# Patient Record
Sex: Female | Born: 1970
Health system: Southern US, Community
[De-identification: ages and names within clinical notes are randomized; demographics above are authoritative.]

## PROBLEM LIST (undated history)

## (undated) DIAGNOSIS — F329 Major depressive disorder, single episode, unspecified: Secondary | ICD-10-CM

## (undated) DIAGNOSIS — E349 Endocrine disorder, unspecified: Secondary | ICD-10-CM

## (undated) DIAGNOSIS — I10 Essential (primary) hypertension: Secondary | ICD-10-CM

## (undated) DIAGNOSIS — D219 Benign neoplasm of connective and other soft tissue, unspecified: Secondary | ICD-10-CM

## (undated) DIAGNOSIS — C50919 Malignant neoplasm of unspecified site of unspecified female breast: Secondary | ICD-10-CM

## (undated) DIAGNOSIS — D649 Anemia, unspecified: Secondary | ICD-10-CM

## (undated) DIAGNOSIS — N979 Female infertility, unspecified: Secondary | ICD-10-CM

## (undated) DIAGNOSIS — N939 Abnormal uterine and vaginal bleeding, unspecified: Secondary | ICD-10-CM

## (undated) DIAGNOSIS — F32A Depression, unspecified: Secondary | ICD-10-CM

## (undated) HISTORY — DX: Major depressive disorder, single episode, unspecified: F32.9

## (undated) HISTORY — DX: Abnormal uterine and vaginal bleeding, unspecified: N93.9

## (undated) HISTORY — PX: MASTECTOMY: SHX3

## (undated) HISTORY — DX: Depression, unspecified: F32.A

## (undated) HISTORY — DX: Malignant neoplasm of unspecified site of unspecified female breast: C50.919

## (undated) HISTORY — PX: BREAST SURGERY: SHX581

## (undated) HISTORY — DX: Endocrine disorder, unspecified: E34.9

## (undated) HISTORY — PX: ABDOMINAL HYSTERECTOMY: SHX81

## (undated) HISTORY — DX: Female infertility, unspecified: N97.9

## (undated) HISTORY — DX: Benign neoplasm of connective and other soft tissue, unspecified: D21.9

## (undated) HISTORY — PX: OTHER SURGICAL HISTORY: SHX169

## (undated) HISTORY — DX: Essential (primary) hypertension: I10

## (undated) HISTORY — DX: Anemia, unspecified: D64.9

---

## 2014-10-25 ENCOUNTER — Telehealth: Payer: Self-pay | Admitting: *Deleted

## 2014-10-25 NOTE — Telephone Encounter (Signed)
Pt called for an appt w/ Dr. Lindi Adie.  Informed her that I would need to obtain records 1st.  Received records, called pt and confirmed 10/26/14 med onc appt w/ her. Unable to mail before appt letter - gave verbal.  Unable to mail welcoming packet - gave directions and instructions.  Unable to mail intake form - placed a note for one to be given at time of check in.  Took paperwork to HIM to create chart.

## 2014-10-26 ENCOUNTER — Ambulatory Visit: Payer: Self-pay | Admitting: Hematology and Oncology

## 2014-10-26 ENCOUNTER — Ambulatory Visit: Payer: Self-pay

## 2014-10-26 ENCOUNTER — Telehealth: Payer: Self-pay | Admitting: *Deleted

## 2014-10-26 NOTE — Telephone Encounter (Signed)
Pt called - mom is in the hospital.  Cancel appt for today and will reschedule at a later time.

## 2014-11-02 ENCOUNTER — Ambulatory Visit: Payer: Self-pay

## 2014-11-02 ENCOUNTER — Encounter: Payer: Self-pay | Admitting: Hematology and Oncology

## 2014-11-02 ENCOUNTER — Encounter (INDEPENDENT_AMBULATORY_CARE_PROVIDER_SITE_OTHER): Payer: Self-pay

## 2014-11-02 ENCOUNTER — Ambulatory Visit (HOSPITAL_BASED_OUTPATIENT_CLINIC_OR_DEPARTMENT_OTHER): Payer: 59 | Admitting: Hematology and Oncology

## 2014-11-02 VITALS — BP 167/109 | HR 95 | Temp 98.6°F | Resp 18 | Wt 146.8 lb

## 2014-11-02 DIAGNOSIS — C50411 Malignant neoplasm of upper-outer quadrant of right female breast: Secondary | ICD-10-CM | POA: Insufficient documentation

## 2014-11-02 DIAGNOSIS — C44501 Unspecified malignant neoplasm of skin of breast: Secondary | ICD-10-CM

## 2014-11-02 DIAGNOSIS — C50911 Malignant neoplasm of unspecified site of right female breast: Secondary | ICD-10-CM

## 2014-11-02 NOTE — Progress Notes (Signed)
Checked in new patient with no issues. She has not traveled and [employer]   [place], [place].

## 2014-11-03 NOTE — Progress Notes (Signed)
[place] CONSULT NOTE  No care team member to display  CHIEF COMPLAINTS/PURPOSE OF CONSULTATION:  Recurrent breast cancer  HISTORY OF PRESENTING ILLNESS:  Misty Arnold 43 y.o. female is here because of recent diagnosis of right breast recurrent breast cancer. 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 She developed recurrence in the chest wall in 2013 at that was excised with positive margins. She did not wish to undergo reexcision are additional treatments as recommended by her oncologist. He decided to go to [place] to receive alternative treatments. Apparently she received this for uncertain length of time. In February 2015 she returned back to see Dr. Denyse Amass who performed breast MRI and ultrasound that showed multiple lesions involving the entire right breast including the medial aspect of the chest wall. She recommended chemotherapy as well as surgical options. It appears that she decided to go back to [place] to receive further treatments. 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 But she plans to go back to see them soon and continue with the same approach in spite of the fact that the disease being continuing to grow in the right breast/chest wall area. Patient seems to think that if only she could take those herbal supplements and medications she can fight this cancer. She reports that stress aggravates her cancer and she can see the difference on her breast.  I reviewed her records extensively and collaborated the history with the patient.  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 But she could not afford these trips.   01/08/2014 Breast MRI right breast multiple enhancing masses within the soft tissues largest 5.6 cm in wall skin surface and the capsule of the silicone prosthesis, contiguous nodules involving in inferomedial breast and tired and subcentimeter nodules across the midline   MEDICAL HISTORY:  Past Medical History  Diagnosis Date  . Breast cancer   . Hypertension     SURGICAL HISTORY: Past Surgical History  Procedure Laterality Date  . Abdominal hysterectomy    . Mastectomy    . Salivary gland stone      SOCIAL HISTORY: History   Social History  . Marital Status: Divorced    Spouse Name: N/A    Number of Children: N/A  . Years of Education: N/A   Occupational History  . Not on file.   Social History Main Topics  . Smoking status: Never Smoker   . Smokeless tobacco: Never Used  . Alcohol Use: No  . Drug Use: No  . Sexual Activity: Yes   Other Topics Concern  . Not on file   Social History Narrative  .  No narrative on file    FAMILY HISTORY: Family History  Problem Relation Age of Onset  . Cancer Maternal Aunt   . Cancer Maternal Grandfather   . Cancer Paternal Grandfather     ALLERGIES:  has no allergies on file.  MEDICATIONS:  Current Outpatient Prescriptions  Medication Sig Dispense Refill  . amLODipine  (NORVASC) 10 MG tablet Take by mouth.    Marland Kitchen Bioflavonoid Products (ESTER C PO) Take by mouth.    Marland Kitchen BLACK CURRANT SEED OIL PO Take by mouth.    . Cholecalciferol (VITAMIN D PO) Take by mouth.    . Fish Oil OIL by Does not apply route.    . Flaxseed Oil OIL by Does not apply route.    . Misc Natural Products (DANDELION ROOT PO) Take by mouth.    . [place], Absinthium, Oil OIL by Does not apply route.     No current facility-administered medications for this visit.    REVIEW OF SYSTEMS:   Constitutional: Denies fevers, chills or abnormal night sweats Eyes: Denies blurriness of vision, double vision or watery eyes Ears, nose, mouth, throat, and face: Denies mucositis or sore throat Respiratory: Denies cough, dyspnea or wheezes Cardiovascular: Denies palpitation, chest discomfort or lower extremity swelling Gastrointestinal:  Denies nausea, heartburn or change in bowel habits Skin: Denies abnormal skin rashes Lymphatics: Denies new lymphadenopathy or easy bruising Neurological:Denies numbness, tingling or new weaknesses Behavioral/Psych: Mood is stable, no new changes  Breast: extremely large palpable masses in the right breast All other systems were reviewed with the patient and are negative.  PHYSICAL EXAMINATION: ECOG PERFORMANCE STATUS: 1 - Symptomatic but completely ambulatory  Filed Vitals:   11/02/14 1621  BP: 167/109  Pulse: 95  Temp: 98.6 F (37 C)  Resp: 18   Filed Weights   11/02/14 1621  Weight: 146 lb 12.8 oz (66.588 kg)    GENERAL:alert, no distress and comfortable SKIN: skin color, texture, turgor are normal, no rashes or significant lesions EYES: normal, conjunctiva are pink and non-injected, sclera clear OROPHARYNX:no exudate, no erythema and lips, buccal mucosa, and tongue normal  NECK: supple, thyroid normal size, non-tender, without nodularity LYMPH:  no palpable lymphadenopathy in the cervical, axillary or inguinal LUNGS: clear to auscultation and  percussion with normal breathing effort HEART: regular rate & rhythm and no murmurs and no lower extremity edema ABDOMEN:abdomen soft, non-tender and normal bowel sounds Musculoskeletal:no cyanosis of digits and no clubbing  PSYCH: alert & oriented x 3 with fluent speech NEURO: no focal motor/sensory deficits BREAST:massively enlarged right breast with multiple nodules that are inherent to the capsule of her implant even along the anterior aspect of the axillary fold. No palpable axillary or supraclavicular lymphadenopathy  RADIOGRAPHIC STUDIES: I have personally reviewed the radiological reports and agreed with the findings in the report. I have summarize the results of her scans as above  ASSESSMENT AND PLAN:  Breast cancer, right breast Recurrent right breast cancer initially DCIS treated with mastectomy followed by reconstruction and later developed chest wall recurrence in 2013 ER/PR positive HER-2 negative, patient underwent alternative therapies in [place] but could not afford the trips and hence she has not been on any breast cancer therapy for a long time.  Staging discussion: I discussed with her that based on the tumor characteristic I would stage her as T4b N0 M0 stage IIIB. However given the extent of skin and soft tissue involvement, I would like up in a PET CT scan to complete staging.  Preliminary  discussion regarding treatment options were explored. Patient is not interested in any conventional treatments and is still planning on going down to [place] to receive her herbal treatments. She is convinced that those treatments offer more to her.  Our Recommendation: neoadjuvant chemotherapy followed by surgery followed by radiation and antiestrogen therapy  But patient would most likely not follow recommendations. She primarily wants [name] to assist her with staging purposes so that she can make her decisions and plans accordingly. return to clinic after PET/CT Scan.   Rulon Eisenmenger, MD 11/03/2014 7:47 AM

## 2014-11-03 NOTE — Assessment & Plan Note (Signed)
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  Staging discussion: I discussed with her that based on the tumor characteristic I would stage her as T4b N0 M0 stage IIIB. However given the extent of skin and soft tissue involvement, I would like up in a PET CT scan to complete staging.  Preliminary discussion regarding treatment options were explored. Patient is not interested in any conventional treatments and is still planning on going down to [place] to receive her herbal treatments. She is convinced that those treatments offer more to her.  Our Recommendation: neoadjuvant chemotherapy followed by surgery followed by radiation and antiestrogen therapy  But patient would most likely not follow recommendations. She primarily wants [name] to assist her with staging purposes so that she can make her decisions and plans accordingly.

## 2014-11-04 ENCOUNTER — Other Ambulatory Visit: Payer: Self-pay

## 2014-11-08 ENCOUNTER — Other Ambulatory Visit: Payer: Self-pay | Admitting: *Deleted

## 2014-11-08 ENCOUNTER — Telehealth: Payer: Self-pay | Admitting: Hematology and Oncology

## 2014-11-08 NOTE — Telephone Encounter (Signed)
, °

## 2014-11-12 ENCOUNTER — Ambulatory Visit (HOSPITAL_COMMUNITY)
Admission: RE | Admit: 2014-11-12 | Discharge: 2014-11-12 | Disposition: A | Discharge status: Home / Self Care | Payer: 59 | Source: Ambulatory Visit | Attending: Hematology and Oncology | Admitting: Hematology and Oncology

## 2014-11-12 ENCOUNTER — Encounter (HOSPITAL_COMMUNITY): Payer: Self-pay

## 2014-11-12 DIAGNOSIS — C50911 Malignant neoplasm of unspecified site of right female breast: Secondary | ICD-10-CM | POA: Insufficient documentation

## 2014-11-12 DIAGNOSIS — Z9011 Acquired absence of right breast and nipple: Secondary | ICD-10-CM | POA: Diagnosis not present

## 2014-11-12 DIAGNOSIS — J323 Chronic sphenoidal sinusitis: Secondary | ICD-10-CM | POA: Diagnosis not present

## 2014-11-12 LAB — GLUCOSE, CAPILLARY: Glucose-Capillary: 89 mg/dL (ref 70–99)

## 2014-11-12 MED ORDER — FLUDEOXYGLUCOSE F - 18 (FDG) INJECTION
7.2800 | Freq: Once | INTRAVENOUS | Status: AC | PRN
  Administered 2014-11-12: 7.28 via INTRAVENOUS

## 2014-11-15 ENCOUNTER — Ambulatory Visit (HOSPITAL_BASED_OUTPATIENT_CLINIC_OR_DEPARTMENT_OTHER): Payer: 59 | Admitting: Hematology and Oncology

## 2014-11-15 ENCOUNTER — Ambulatory Visit (HOSPITAL_BASED_OUTPATIENT_CLINIC_OR_DEPARTMENT_OTHER): Payer: 59

## 2014-11-15 ENCOUNTER — Telehealth: Payer: Self-pay | Admitting: Hematology and Oncology

## 2014-11-15 DIAGNOSIS — Z170 Estrogen receptor positive status [ER+]: Secondary | ICD-10-CM

## 2014-11-15 DIAGNOSIS — C50911 Malignant neoplasm of unspecified site of right female breast: Secondary | ICD-10-CM

## 2014-11-15 DIAGNOSIS — C7989 Secondary malignant neoplasm of other specified sites: Secondary | ICD-10-CM

## 2014-11-15 LAB — CBC WITH DIFFERENTIAL/PLATELET
BASO%: 0.5 % (ref 0.0–2.0)
Basophils Absolute: 0 10*3/uL (ref 0.0–0.1)
EOS%: 0.3 % (ref 0.0–7.0)
Eosinophils Absolute: 0 10*3/uL (ref 0.0–0.5)
HCT: 43.5 % (ref 34.8–46.6)
HGB: 14.3 g/dL (ref 11.6–15.9)
LYMPH%: 36.9 % (ref 14.0–49.7)
MCH: 29.9 pg (ref 25.1–34.0)
MCHC: 32.9 g/dL (ref 31.5–36.0)
MCV: 91 fL (ref 79.5–101.0)
MONO#: 0.4 10*3/uL (ref 0.1–0.9)
MONO%: 5.2 % (ref 0.0–14.0)
NEUT#: 4.2 10*3/uL (ref 1.5–6.5)
NEUT%: 57.1 % (ref 38.4–76.8)
Platelets: 260 10*3/uL (ref 145–400)
RBC: 4.78 10*6/uL (ref 3.70–5.45)
RDW: 12.7 % (ref 11.2–14.5)
WBC: 7.3 10*3/uL (ref 3.9–10.3)
lymph#: 2.7 10*3/uL (ref 0.9–3.3)

## 2014-11-15 LAB — COMPREHENSIVE METABOLIC PANEL (CC13)
ALT: 12 U/L (ref 0–55)
AST: 17 U/L (ref 5–34)
Albumin: 4.1 g/dL (ref 3.5–5.0)
Alkaline Phosphatase: 72 U/L (ref 40–150)
Anion Gap: 6 mEq/L (ref 3–11)
BUN: 9.8 mg/dL (ref 7.0–26.0)
CO2: 28 mEq/L (ref 22–29)
Calcium: 9.2 mg/dL (ref 8.4–10.4)
Chloride: 105 mEq/L (ref 98–109)
Creatinine: 0.8 mg/dL (ref 0.6–1.1)
EGFR: >90 mL/min/{1.73_m2} (ref >90)
Glucose: 92 mg/dl (ref 70–140)
Potassium: 3.7 mEq/L (ref 3.5–5.1)
Sodium: 139 mEq/L (ref 136–145)
Total Bilirubin: 0.44 mg/dL (ref 0.20–1.20)
Total Protein: 7.3 g/dL (ref 6.4–8.3)

## 2014-11-15 NOTE — Assessment & Plan Note (Signed)
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  PET/CT scan 11/12/2014 did not show metastatic disease but showed extensive involvement in the right breast in an around the implant Stage 3B  Our Recommendation: neoadjuvant chemotherapy followed by surgery followed by radiation and antiestrogen therapy Patient's decision: 1. Return to [place] for alternative treatments 2. Come back and follow as once every 3 months for blood work and for the assessment of her breast cancer. 3. I instructed her that if she feels that the alternative treatments are not working, she needs to call us ASAP so that we can initiate definitive treatment.  Blood work to be done today and in 3 months with follow-up.

## 2014-11-15 NOTE — Progress Notes (Signed)
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; patient went to cancer treatment centers of [place] for second opinion and delayed therapy   01/26/2011 Surgery right mastectomy followed by reconstruction: 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 But she could not afford these trips.   01/08/2014 Breast MRI right breast multiple enhancing masses within the soft tissues largest 5.6 cm in wall skin surface and the capsule of the silicone prosthesis, contiguous nodules involving in inferomedial breast and tired and subcentimeter nodules across the midline    CHIEF COMPLIANT: right breast cancer follow-up after PET/CT  INTERVAL HISTORY: Misty Arnold is a 44 year old lady with above-mentioned history of right-sided breast cancer who came for a new patient visit for recurrent disease a few weeks ago. She had a PET CT scan is here today to discuss the result. She reports that the breast cancer that is affecting the skin and the subcutaneous tissues in the right breast is not hurting her or bleeding.  REVIEW OF SYSTEMS:   Constitutional: Denies fevers, chills or  abnormal weight loss Eyes: Denies blurriness of vision Ears, nose, mouth, throat, and face: Denies mucositis or sore throat Respiratory: Denies cough, dyspnea or wheezes Cardiovascular: Denies palpitation, chest discomfort or lower extremity swelling Gastrointestinal:  Denies nausea, heartburn or change in bowel habits Skin: Denies abnormal skin rashes Lymphatics: Denies new lymphadenopathy or easy bruising Neurological:Denies numbness, tingling or new weaknesses Behavioral/Psych: Mood is stable, no new changes  Breast: moderate nodularity and skin changes in the right breast All other systems were reviewed with the patient and are negative.  I have reviewed the past medical history, past surgical history, social history and family history with the patient and they are unchanged from previous note.  ALLERGIES:  has No Known Allergies.  MEDICATIONS:  Current Outpatient Prescriptions  Medication Sig Dispense Refill  . amLODipine (NORVASC) 10 MG tablet Take by mouth.    Marland Kitchen Bioflavonoid Products (ESTER C PO) Take by mouth.    Marland Kitchen BLACK CURRANT SEED OIL PO Take by mouth.    . Cholecalciferol (VITAMIN D PO) Take by mouth.    . escitalopram (LEXAPRO) 10 MG tablet     . Fish Oil OIL by Does not apply route.    . Flaxseed Oil OIL by Does not apply route.    . Misc Natural Products (DANDELION ROOT PO) Take by mouth.    . [place], Absinthium, Oil OIL by Does not apply route.     No current facility-administered medications for this visit.    PHYSICAL EXAMINATION: ECOG PERFORMANCE STATUS: 1 - Symptomatic but completely ambulatory  Filed Vitals:   11/15/14 1111  BP: 172/106  Pulse: 74  Temp: 98.5 F (36.9 C)  Resp: 19   Filed Weights   11/15/14 1111  Weight: 151 lb 1.6 oz (68.539 kg)    GENERAL:alert, no distress and comfortable SKIN: skin color, texture, turgor are normal, no rashes or significant lesions EYES: normal, Conjunctiva are pink and non-injected, sclera  clear OROPHARYNX:no exudate, no erythema and lips, buccal mucosa, and tongue normal  NECK: supple, thyroid normal size, non-tender, without nodularity LYMPH:  no palpable lymphadenopathy in the cervical, axillary or inguinal LUNGS: clear to auscultation and percussion with normal breathing effort HEART: regular rate & rhythm and no murmurs and no lower extremity edema ABDOMEN:abdomen soft, non-tender and normal bowel sounds Musculoskeletal:no cyanosis of digits and no clubbing  NEURO: alert & oriented x 3 with fluent speech, no focal motor/sensory deficits   LABORATORY DATA:  I have reviewed the data as listed   Chemistry   No results found for: NA, K, CL, CO2, BUN, CREATININE, GLU No results found for: CALCIUM, ALKPHOS, AST, ALT, BILITOT     No results found for: WBC, HGB, HCT, MCV, PLT, NEUTROABS   RADIOGRAPHIC STUDIES: I have personally reviewed the radiology reports and agreed with their findings. 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  PET/CT scan 11/12/2014 did not show metastatic disease but showed extensive involvement in the right breast in an around the implant Stage 3B  Our Recommendation: neoadjuvant chemotherapy followed by surgery followed by radiation and antiestrogen therapy Patient's decision: 1. Return to [place] for alternative treatments 2. Come back and follow as once every 3 months for blood work and for the assessment of her breast cancer. 3. I instructed her that if she feels that the alternative  treatments are not working, she needs to call us ASAP so that we can initiate definitive treatment.  Blood work to be done today and in 3 months with follow-up.   Orders Placed This Encounter  Procedures  . CBC with Differential    Standing Status: Future     Number of Occurrences:      Standing Expiration Date: 11/15/2015  . Comprehensive metabolic panel (Cmet) - [facility]    Standing Status: Future     Number of Occurrences:      Standing Expiration Date: 11/15/2015   The patient has a good understanding of the overall plan. she agrees with it. She will call with any problems that may develop before her next visit here.   Rulon Eisenmenger, MD 11/15/2014 12:19 PM

## 2014-11-15 NOTE — Telephone Encounter (Signed)
per pof to sch pt appt-gave pt copy of sch °

## 2015-02-10 ENCOUNTER — Telehealth: Payer: Self-pay | Admitting: Hematology and Oncology

## 2015-02-10 ENCOUNTER — Other Ambulatory Visit: Payer: Self-pay | Admitting: *Deleted

## 2015-02-10 DIAGNOSIS — C50911 Malignant neoplasm of unspecified site of right female breast: Secondary | ICD-10-CM

## 2015-02-10 NOTE — Telephone Encounter (Signed)
Patient called in to cancel her  appointment and states that she will call in to reschedule

## 2015-02-14 ENCOUNTER — Ambulatory Visit: Payer: Self-pay | Admitting: Hematology and Oncology

## 2015-02-14 ENCOUNTER — Other Ambulatory Visit: Payer: Self-pay

## 2016-03-02 ENCOUNTER — Telehealth: Payer: Self-pay

## 2016-03-02 ENCOUNTER — Other Ambulatory Visit: Payer: Self-pay

## 2016-03-02 NOTE — Telephone Encounter (Signed)
Per Dr. Lindi Adie, he will need to see the patient.  Appt made with patient for 5/1 8 am labs and 815 office visit.  I requested patient bring list of all medications and supplements she has taken since she was last seen.  Pt voiced understanding.

## 2016-03-02 NOTE — Telephone Encounter (Signed)
Patient is calling requesting to be put on letrozole 2.5 mg daily because the " natural hormone" she was taking - given to her by her naturalist isn't "working".  She states that she "doesn't want IT to come back". Terri, RN aware of request for medication.  Writer put her requested pharmacy in the preferred pharmacy.

## 2016-03-05 ENCOUNTER — Encounter: Payer: Self-pay | Admitting: *Deleted

## 2016-03-05 ENCOUNTER — Encounter: Payer: Self-pay | Admitting: Hematology and Oncology

## 2016-03-05 ENCOUNTER — Telehealth: Payer: Self-pay | Admitting: Hematology and Oncology

## 2016-03-05 ENCOUNTER — Other Ambulatory Visit: Payer: 59

## 2016-03-05 ENCOUNTER — Ambulatory Visit (HOSPITAL_BASED_OUTPATIENT_CLINIC_OR_DEPARTMENT_OTHER): Payer: 59

## 2016-03-05 ENCOUNTER — Ambulatory Visit (HOSPITAL_BASED_OUTPATIENT_CLINIC_OR_DEPARTMENT_OTHER): Payer: 59 | Admitting: Hematology and Oncology

## 2016-03-05 VITALS — BP 161/96 | HR 86 | Temp 98.3°F | Resp 18 | Ht 65.0 in | Wt 137.6 lb

## 2016-03-05 DIAGNOSIS — C50811 Malignant neoplasm of overlapping sites of right female breast: Secondary | ICD-10-CM | POA: Diagnosis not present

## 2016-03-05 DIAGNOSIS — C773 Secondary and unspecified malignant neoplasm of axilla and upper limb lymph nodes: Secondary | ICD-10-CM | POA: Diagnosis not present

## 2016-03-05 DIAGNOSIS — C7989 Secondary malignant neoplasm of other specified sites: Secondary | ICD-10-CM

## 2016-03-05 DIAGNOSIS — Z170 Estrogen receptor positive status [ER+]: Secondary | ICD-10-CM | POA: Diagnosis not present

## 2016-03-05 DIAGNOSIS — C50411 Malignant neoplasm of upper-outer quadrant of right female breast: Secondary | ICD-10-CM

## 2016-03-05 LAB — CBC WITH DIFFERENTIAL/PLATELET
BASO%: 0.7 % (ref 0.0–2.0)
Basophils Absolute: 0 10*3/uL (ref 0.0–0.1)
EOS%: 0.5 % (ref 0.0–7.0)
Eosinophils Absolute: 0 10*3/uL (ref 0.0–0.5)
HCT: 42.7 % (ref 34.8–46.6)
HGB: 14.2 g/dL (ref 11.6–15.9)
LYMPH%: 30.7 % (ref 14.0–49.7)
MCH: 29.3 pg (ref 25.1–34.0)
MCHC: 33.3 g/dL (ref 31.5–36.0)
MCV: 88 fL (ref 79.5–101.0)
MONO#: 0.4 10*3/uL (ref 0.1–0.9)
MONO%: 8.5 % (ref 0.0–14.0)
NEUT#: 3.1 10*3/uL (ref 1.5–6.5)
NEUT%: 59.6 % (ref 38.4–76.8)
Platelets: 228 10*3/uL (ref 145–400)
RBC: 4.85 10*6/uL (ref 3.70–5.45)
RDW: 17.7 % — ABNORMAL HIGH (ref 11.2–14.5)
WBC: 5.3 10*3/uL (ref 3.9–10.3)
lymph#: 1.6 10*3/uL (ref 0.9–3.3)

## 2016-03-05 LAB — COMPREHENSIVE METABOLIC PANEL
ALT: 15 U/L (ref 0–55)
AST: 17 U/L (ref 5–34)
Albumin: 4.1 g/dL (ref 3.5–5.0)
Alkaline Phosphatase: 68 U/L (ref 40–150)
Anion Gap: 7 mEq/L (ref 3–11)
BUN: 10 mg/dL (ref 7.0–26.0)
CO2: 26 mEq/L (ref 22–29)
Calcium: 9.5 mg/dL (ref 8.4–10.4)
Chloride: 106 mEq/L (ref 98–109)
Creatinine: 1 mg/dL (ref 0.6–1.1)
EGFR: 76 mL/min/{1.73_m2} — ABNORMAL LOW (ref >90)
Glucose: 96 mg/dl (ref 70–140)
Potassium: 3.8 mEq/L (ref 3.5–5.1)
Sodium: 139 mEq/L (ref 136–145)
Total Bilirubin: 0.55 mg/dL (ref 0.20–1.20)
Total Protein: 7.8 g/dL (ref 6.4–8.3)

## 2016-03-05 LAB — LACTATE DEHYDROGENASE: LDH: 192 U/L (ref 125–245)

## 2016-03-05 MED ORDER — TAMOXIFEN CITRATE 20 MG PO TABS: 20.0000 mg | ORAL_TABLET | Freq: Every day | ORAL | Status: DC

## 2016-03-05 NOTE — Assessment & Plan Note (Addendum)
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  PET/CT scan 11/12/2014 did not show metastatic disease but showed extensive involvement in the right breast in an around the implant Stage 3B  Right mastectomy 08/29/2015 at [facility] (Dr.Singh): IDC with invol of skin and skin ulceration, breast capsule inv cancer, superior medial margin positive, 1/1 LN positive, grade 3, 14.3 cm, 3.5 cm, ALI, chest wall involv, ER 90-100%, PR 80-90%, HER-2 negative, Ki 67 40-50% T4CN1 (St 3B)  Treatment plan: 1. Check whether the patient is in menopause by [facility] and estradiol levels 2. If the patient is postmenopausal and we can prescribe omeprazole which would be her preference. She is premenopausal then we will prescribe her tamoxifen. I discussed with her the difference between anastrozole and tamoxifen in terms of side effects. I will call the patient with the results of the [facility] and estradiol. She 3. Come back and follow up once every 3 months for blood work and for the assessment of her breast cancer.  Patient goes to [place] for alternate treatments periodically. I instructed her to call us with any changes in her treatment plan so that we can check for interactions.  Blood work to be done today and in 3 months with follow-up.

## 2016-03-05 NOTE — Progress Notes (Signed)
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; patient went to cancer treatment centers of [place] for second opinion and delayed therapy   01/26/2011 Surgery right mastectomy followed by reconstruction: 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 But she could not afford these trips.   01/08/2014 Breast MRI right breast multiple enhancing masses within the soft tissues largest 5.6 cm in wall skin surface and the capsule of the silicone prosthesis, contiguous nodules involving in inferomedial breast and tired and subcentimeter nodules across the midline   08/29/2015 Surgery Right mastectomy: IDC with invol of skin and skin ulceration, breast capsule inv cancer, superior medial margin positive, 1/1 LN positive, grade 3, 14.3 cm, 3.5 cm, ALI, chest wall involv, ER 90-100%, PR 80-90%, HER-2 negative, Ki 67 40-50% T4CN1 (St 3B)    CHIEF COMPLIANT: Patient had been away to [place] and back multiple times over the last year.  INTERVAL HISTORY: Misty Arnold is a 45 year old left American lady  who has been on natural hormone therapy treatments through some providers [place] for her breast cancer. Apparently her breast cancer was improving when she was on these hormone therapy measures. But on one of her visits the told her that the antiestrogen treatments and not being produced anymore. At that point she went on a vitamin supplement which apparently did not work and her tumor started to grow. At that time her natural path doctor for her to Dr. Candiss Norse who performed right mastectomy on 08/28/2016. The mastectomy showed positive margins and one out of one lymph node was positive. The total tumor was extremely large at 14.7 cm. Was ER/PR positive HER-2 negative and Ki-67 of 50%. Since October she developed local recurrence of breast cancer with a lesion that is now slowly growing in size. She wanted to see me because she felt that the antiestrogen therapy would be the best treatment for her and I could prescribe an antiestrogen treatment.  REVIEW OF SYSTEMS:   Constitutional: Denies fevers, chills or abnormal weight loss Eyes: Denies blurriness of vision Ears, nose, mouth, throat, and face: Denies mucositis or sore throat Respiratory: Denies cough, dyspnea or wheezes Cardiovascular: Denies palpitation, chest discomfort Gastrointestinal:  Denies nausea, heartburn or change in bowel habits Skin: Denies abnormal skin rashes Lymphatics: Denies new lymphadenopathy or easy bruising Neurological:Denies numbness, tingling or new weaknesses Behavioral/Psych: Mood is stable, no new changes  Extremities: No lower extremity edema Breast:  Chest wall recurrence from right breast cancer All other systems were reviewed with the patient and are negative.  I have reviewed the past medical history, past surgical history, social history and family history with the patient and they  are unchanged from previous note.  ALLERGIES:  has No Known Allergies.  MEDICATIONS:  Current Outpatient Prescriptions    Medication Sig Dispense Refill  . amLODipine (NORVASC) 10 MG tablet Take by mouth.    Marland Kitchen Bioflavonoid Products (ESTER C PO) Take by mouth.    Marland Kitchen BLACK CURRANT SEED OIL PO Take by mouth.    . Cholecalciferol (VITAMIN D PO) Take by mouth.    . escitalopram (LEXAPRO) 10 MG tablet     . Fish Oil OIL by Does not apply route.    . Flaxseed Oil OIL by Does not apply route.    . Misc Natural Products (DANDELION ROOT PO) Take by mouth.    . tamoxifen (NOLVADEX) 20 MG tablet Take 1 tablet (20 mg total) by mouth daily. 15 tablet 0  . [place], Absinthium, Oil OIL by Does not apply route.     No current facility-administered medications for this visit.    PHYSICAL EXAMINATION: ECOG PERFORMANCE STATUS: 1 - Symptomatic but completely ambulatory  Filed Vitals:   03/05/16 0840  BP: 161/96  Pulse: 86  Temp: 98.3 F (36.8 C)  Resp: 18   Filed Weights   03/05/16 0840  Weight: 137 lb 9.6 oz (62.415 kg)    GENERAL:alert, no distress and comfortable SKIN: skin color, texture, turgor are normal, no rashes or significant lesions EYES: normal, Conjunctiva are pink and non-injected, sclera clear OROPHARYNX:no exudate, no erythema and lips, buccal mucosa, and tongue normal  NECK: supple, thyroid normal size, non-tender, without nodularity LYMPH:  no palpable lymphadenopathy in the cervical, axillary or inguinal LUNGS: clear to auscultation and percussion with normal breathing effort HEART: regular rate & rhythm and no murmurs and no lower extremity edema ABDOMEN:abdomen soft, non-tender and normal bowel sounds MUSCULOSKELETAL:no cyanosis of digits and no clubbing  NEURO: alert & oriented x 3 with fluent speech, no focal motor/sensory deficits EXTREMITIES: No lower extremity edema BREAST: 6 cm x 4 cm lesion in the right breast chest wall (exam performed in the presence of a chaperone). No palpable lymphadenopathy that appears to be a satellite lesion next to the recurrence  LABORATORY DATA:  I have  reviewed the data as listed   Chemistry      Component Value Date/Time   NA 139 11/15/2014 1258   K 3.7 11/15/2014 1258   CO2 28 11/15/2014 1258   BUN 9.8 11/15/2014 1258   CREATININE 0.8 11/15/2014 1258      Component Value Date/Time   CALCIUM 9.2 11/15/2014 1258   ALKPHOS 72 11/15/2014 1258   AST 17 11/15/2014 1258   ALT 12 11/15/2014 1258   BILITOT 0.44 11/15/2014 1258       Lab Results  Component Value Date   WBC 5.3 03/05/2016   HGB 14.2 03/05/2016   HCT 42.7 03/05/2016   MCV 88.0 03/05/2016   PLT 228 03/05/2016   NEUTROABS 3.1 03/05/2016     ASSESSMENT & PLAN:  Breast cancer of upper-outer quadrant of right female breast ([place]) Recurrent right breast cancer initially DCIS treated with mastectomy followed by reconstruction and later developed chest wall recurrence in 2013 ER/PR positive HER-2 negative, patient underwent alternative therapies in [place] but could not afford the trips and hence she has not been on any breast cancer therapy for a long time.  PET/CT scan 11/12/2014 did not show metastatic disease but showed extensive involvement in the right breast in an around the implant Stage 3B  Right mastectomy 08/29/2015 at [place] (Dr.Singh): IDC with invol of  skin and skin ulceration, breast capsule inv cancer, superior medial margin positive, 1/1 LN positive, grade 3, 14.3 cm, 3.5 cm, ALI, chest wall involv, ER 90-100%, PR 80-90%, HER-2 negative, Ki 67 40-50% T4CN1 (St 3B)  Treatment plan: 1. Check whether the patient is in menopause by [facility] and estradiol levels 2. If the patient is postmenopausal and we can prescribe letrozole which would be her preference. If she is premenopausal, then we will prescribe her tamoxifen. I discussed with her the difference between anastrozole and tamoxifen in terms of side effects. I will call the patient with the results of the [facility] and estradiol. She 3. Come back and follow up once every 3 months for blood work and for the assessment  of her breast cancer.  In the interim I would like to get her set her on tamoxifen today. I sent her a two-week prescription for tamoxifen. If she is tolerating it well and if she is premenopausal then I will give her a longer prescription for tamoxifen. She had hysterectomy but continues to have ovaries.  Patient goes to [place] for alternate treatments periodically. I instructed her to call us with any changes in her treatment plan so that we can check for interactions.  Blood work to be done today and in 3 months with follow-up.   Orders Placed This Encounter  Procedures  . Follicle stimulating hormone    Standing Status: Future     Number of Occurrences: 1     Standing Expiration Date: 04/09/2017  . Estradiol, Ultra Sens  . CBC with Differential    Standing Status: Future     Number of Occurrences: 1     Standing Expiration Date: 03/05/2017  . Lactate dehydrogenase (LDH)    Standing Status: Future     Number of Occurrences: 1     Standing Expiration Date: 03/05/2017  . Comprehensive metabolic panel    Standing Status: Future     Number of Occurrences: 1     Standing Expiration Date: 03/05/2017  . CBC with Differential    Standing Status: Future     Number of Occurrences:      Standing Expiration Date: 03/05/2017  . Comprehensive metabolic panel    Standing Status: Future     Number of Occurrences:      Standing Expiration Date: 03/05/2017   The patient has a good understanding of the overall plan. she agrees with it. she will call with any problems that may develop before the next visit here.   Rulon Eisenmenger, MD 03/05/2016

## 2016-03-05 NOTE — Telephone Encounter (Signed)
appt made and avs printed °

## 2016-03-05 NOTE — Progress Notes (Signed)
[place] Work  Clinical Social Work was referred by Futures trader for completion of ADRs. Pt requesting to complete this am, however, CSW already has appointments with other pt. CSW reviewed process of ADR completion and pt will contact CSW for appt at later date to complete. CSW reviewed packet with pt and also educated her on how to complete in her local community as pt lives in [place] and does not return to [facility] until August. Pt plans to reach out with appt request in the next week or so.   Clinical Social Work interventions: ADR education  Loren Racer, [facility] Worker [place]  [place] Phone: [phone] Fax: [phone]

## 2016-03-15 ENCOUNTER — Other Ambulatory Visit: Payer: Self-pay

## 2016-03-15 DIAGNOSIS — C50411 Malignant neoplasm of upper-outer quadrant of right female breast: Secondary | ICD-10-CM

## 2016-03-15 NOTE — Progress Notes (Signed)
Received VM from pt inquiring about her lab results from 03/05/16.  FSH/Estradiol was ordered to determine Tamoxifen vs. Letrozole but was found to not have been collected.  Called pt back and explained this to her and asked her to come back at her convenience to have labs drawn.  Pt wishes to come in first thing tomorrow morning.  POF entered and pt without further questions at this time.

## 2016-03-16 ENCOUNTER — Telehealth: Payer: Self-pay | Admitting: Hematology and Oncology

## 2016-03-16 ENCOUNTER — Other Ambulatory Visit (HOSPITAL_BASED_OUTPATIENT_CLINIC_OR_DEPARTMENT_OTHER): Payer: 59

## 2016-03-16 DIAGNOSIS — C50811 Malignant neoplasm of overlapping sites of right female breast: Secondary | ICD-10-CM

## 2016-03-16 DIAGNOSIS — C50411 Malignant neoplasm of upper-outer quadrant of right female breast: Secondary | ICD-10-CM

## 2016-03-16 LAB — COMPREHENSIVE METABOLIC PANEL
ALT: 17 U/L (ref 0–55)
AST: 17 U/L (ref 5–34)
Albumin: 4.4 g/dL (ref 3.5–5.0)
Alkaline Phosphatase: 78 U/L (ref 40–150)
Anion Gap: 9 mEq/L (ref 3–11)
BUN: 8.8 mg/dL (ref 7.0–26.0)
CO2: 27 mEq/L (ref 22–29)
Calcium: 9.8 mg/dL (ref 8.4–10.4)
Chloride: 105 mEq/L (ref 98–109)
Creatinine: 0.9 mg/dL (ref 0.6–1.1)
EGFR: >90 mL/min/{1.73_m2} (ref >90)
Glucose: 93 mg/dl (ref 70–140)
Potassium: 3.6 mEq/L (ref 3.5–5.1)
Sodium: 141 mEq/L (ref 136–145)
Total Bilirubin: 0.61 mg/dL (ref 0.20–1.20)
Total Protein: 8.4 g/dL — ABNORMAL HIGH (ref 6.4–8.3)

## 2016-03-16 LAB — CBC WITH DIFFERENTIAL/PLATELET
BASO%: 0.6 % (ref 0.0–2.0)
Basophils Absolute: 0 10*3/uL (ref 0.0–0.1)
EOS%: 0.7 % (ref 0.0–7.0)
Eosinophils Absolute: 0 10*3/uL (ref 0.0–0.5)
HCT: 48.4 % — ABNORMAL HIGH (ref 34.8–46.6)
HGB: 15.8 g/dL (ref 11.6–15.9)
LYMPH%: 31.1 % (ref 14.0–49.7)
MCH: 29.1 pg (ref 25.1–34.0)
MCHC: 32.7 g/dL (ref 31.5–36.0)
MCV: 89 fL (ref 79.5–101.0)
MONO#: 0.3 10*3/uL (ref 0.1–0.9)
MONO%: 6.4 % (ref 0.0–14.0)
NEUT#: 3.2 10*3/uL (ref 1.5–6.5)
NEUT%: 61.2 % (ref 38.4–76.8)
Platelets: 238 10*3/uL (ref 145–400)
RBC: 5.43 10*6/uL (ref 3.70–5.45)
RDW: 16.8 % — ABNORMAL HIGH (ref 11.2–14.5)
WBC: 5.3 10*3/uL (ref 3.9–10.3)
lymph#: 1.6 10*3/uL (ref 0.9–3.3)

## 2016-03-16 NOTE — Telephone Encounter (Signed)
per pof to sch pt app-pt here

## 2016-03-17 LAB — FOLLICLE STIMULATING HORMONE: FSH: 5.4 m[IU]/mL

## 2016-03-19 ENCOUNTER — Other Ambulatory Visit: Payer: Self-pay | Admitting: Hematology and Oncology

## 2016-03-19 DIAGNOSIS — C50411 Malignant neoplasm of upper-outer quadrant of right female breast: Secondary | ICD-10-CM

## 2016-03-19 MED ORDER — TAMOXIFEN CITRATE 20 MG PO TABS: 20.0000 mg | ORAL_TABLET | Freq: Every day | ORAL | Status: DC

## 2016-03-22 LAB — ESTRADIOL, ULTRA SENS: Estradiol, Sensitive: 250.9 pg/mL

## 2016-03-23 ENCOUNTER — Telehealth: Payer: Self-pay

## 2016-03-23 NOTE — Telephone Encounter (Signed)
Returned VM received from pt regarding tamoxifen vs. Letrozole. Pt states she started tamoxifen at the beginning of the month and about 2 weeks later went to the ED for a severe headache.  BP found to be extremely elevated.  Pt was given clonidine PRN and has been checking BP regularly since this event.  Pt spoke to Dr. Lindi Adie about this episode and according to pt the decision was made for pt to continue taking tamoxifen while monitoring BP and should BP remain elevated, pt could try letrozole.  Pt reports last night she took her BP and it was found to be 222/106.  Pt denies seeking ED for this but took clonidine and BP has since been within normal range.  Pt currently denies any signs of stroke.  Pt is wanting to stop tamoxifen and start letrozole and is requesting a prescription for this.  I informed pt I would convey all of this information to Dr. Lindi Adie when he returns to the office on Monday.  Pt also instructed to seek emergency care should she develop a sudden severe headache, extremely elevated BP, or other symptoms of stroke as reviewed with pt.  Pt verbalized understanding and no further questions at time of call.

## 2016-03-26 ENCOUNTER — Other Ambulatory Visit: Payer: Self-pay | Admitting: *Deleted

## 2016-03-26 ENCOUNTER — Telehealth: Payer: Self-pay | Admitting: *Deleted

## 2016-03-26 NOTE — Telephone Encounter (Signed)
Called patient to advise to stop taking Tamoxifen. Patient stopped Tamoxifen on 5/13. pof sent for OV to be scheduled for the week of 5/30.

## 2016-04-03 ENCOUNTER — Ambulatory Visit (HOSPITAL_BASED_OUTPATIENT_CLINIC_OR_DEPARTMENT_OTHER): Payer: 59 | Admitting: Hematology and Oncology

## 2016-04-03 ENCOUNTER — Encounter: Payer: Self-pay | Admitting: Hematology and Oncology

## 2016-04-03 VITALS — BP 189/109 | HR 70 | Temp 98.4°F | Resp 18 | Wt 137.4 lb

## 2016-04-03 DIAGNOSIS — C50411 Malignant neoplasm of upper-outer quadrant of right female breast: Secondary | ICD-10-CM

## 2016-04-03 DIAGNOSIS — C7989 Secondary malignant neoplasm of other specified sites: Secondary | ICD-10-CM

## 2016-04-03 DIAGNOSIS — C50811 Malignant neoplasm of overlapping sites of right female breast: Secondary | ICD-10-CM | POA: Diagnosis not present

## 2016-04-03 DIAGNOSIS — C773 Secondary and unspecified malignant neoplasm of axilla and upper limb lymph nodes: Secondary | ICD-10-CM

## 2016-04-03 DIAGNOSIS — Z170 Estrogen receptor positive status [ER+]: Secondary | ICD-10-CM

## 2016-04-03 NOTE — Assessment & Plan Note (Signed)
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  PET/CT scan 11/12/2014 did not show metastatic disease but showed extensive involvement in the right breast in an around the implant Stage 3B  Right mastectomy 08/29/2015 at [place] [facility] (Dr.Singh): IDC with invol of skin and skin ulceration, breast capsule inv cancer, superior medial margin positive, 1/1 LN positive, grade 3, 14.3 cm, 3.5 cm, ALI, chest wall involv, ER 90-100%, PR 80-90%, HER-2 negative, Ki 67 40-50% T4CN1 (St 3B)  Treatment plan: (Patient is premenopausal based on blood work done May 2017, estradiol 250, FSH 5.4) 1. Patient started tamoxifen but developed severe headache with severe hypertension and tamoxifen was discontinued 03/17/2016.  2. I discussed with the patient was starting on half of tablet of tamoxifen daily and then slowly increasing it to 20 mg daily.

## 2016-04-03 NOTE — Progress Notes (Signed)
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; Misty Arnold went to cancer treatment centers of [place] for second opinion and delayed therapy   01/26/2011 Surgery right mastectomy followed by reconstruction: Invasive ductal carcinoma T1 N1 MIC M0 stage IB ER/PR positive HER-2 negative, BRCA negative, Oncotype DX low risk   05/29/2012 Procedure right chest wall nodule excision done on 06/24/2012 showed metastatic carcinoma margins were positive, but CT scan no metastatic disease, recommended chemotherapy but Misty Arnold refused also refused reexcision   04/28/2013 Treatment Plan Change Misty Arnold went to [place] for alternative treatments and took herbal medications, tonics etc. But she could not afford these trips.   01/08/2014 Breast MRI right breast multiple enhancing masses within the soft tissues largest 5.6 cm in wall skin surface and the capsule of the silicone prosthesis, contiguous nodules involving in inferomedial breast and tired and subcentimeter nodules across the midline   08/29/2015 Surgery Right mastectomy: IDC with invol of skin and skin ulceration, breast capsule inv cancer, superior medial margin positive, 1/1 LN positive, grade 3, 14.3 cm, 3.5 cm, ALI, chest wall involv, ER 90-100%, PR 80-90%, HER-2 negative, Ki 67 40-50% T4CN1 (St 3B)   03/05/2016 -  Anti-estrogen oral therapy Tamoxifen 20 mg daily stopped due to headache and uncontrolled hypertension. Decrease  to 10 mg daily 04/03/2016    CHIEF COMPLIANT: Inability to tolerate tamoxifen due to headaches and hypertension  INTERVAL HISTORY: Misty Arnold is a 45 year old with above-mentioned history of right breast cancer currently on tamoxifen therapy but she could not tolerate it because of headaches and uncontrolled hypertension. We asked her to stop the therapy. It appears that the blood pressure is slightly better. She still has systolic of 440 blood pressure. At home her blood pressure runs in the 140s. She is seeing a primary care doctor extensively regarding her blood pressure issues. Denies any current problems with headache.  REVIEW OF SYSTEMS:   Constitutional: Denies fevers, chills or abnormal weight loss Eyes: Denies blurriness of vision Ears, nose, mouth, throat, and face: Denies mucositis or sore throat Respiratory: Denies cough, dyspnea or wheezes Cardiovascular: Denies palpitation, chest discomfort Gastrointestinal:  Denies nausea, heartburn or change in bowel habits Skin: Denies abnormal skin rashes Lymphatics: Denies new lymphadenopathy or easy bruising Neurological:Denies numbness, tingling or new weaknesses, headaches due to hypertension Behavioral/Psych: Mood is stable, no new changes  Extremities: No lower extremity edema  All other systems were reviewed with the Misty Arnold and are negative.  I have reviewed the past medical history, past surgical history, social history and family history with the Misty Arnold and they are unchanged from previous note.  ALLERGIES:  has No Known Allergies.  MEDICATIONS:  Current Outpatient Prescriptions  Medication Sig Dispense Refill  . amLODipine (NORVASC) 10 MG tablet Take by mouth.    Marland Kitchen Bioflavonoid Products (ESTER C PO) Take by mouth.    Marland Kitchen BLACK CURRANT SEED OIL PO Take by mouth.    . Cholecalciferol (VITAMIN D PO) Take by  mouth.    . escitalopram (LEXAPRO) 10 MG tablet     . Fish Oil OIL by Does not apply route.    . Flaxseed Oil OIL  by Does not apply route.    . Misc Natural Products (DANDELION ROOT PO) Take by mouth.    . tamoxifen (NOLVADEX) 20 MG tablet Take 1 tablet (20 mg total) by mouth daily. 90 tablet 3  . [place], Absinthium, Oil OIL by Does not apply route.     No current facility-administered medications for this visit.    PHYSICAL EXAMINATION: ECOG PERFORMANCE STATUS: 1 - Symptomatic but completely ambulatory  Filed Vitals:   04/03/16 1105  BP: 189/109  Pulse: 70  Temp: 98.4 F (36.9 C)  Resp: 18   Filed Weights   04/03/16 1105  Weight: 137 lb 6.4 oz (62.324 kg)    GENERAL:alert, no distress and comfortable SKIN: skin color, texture, turgor are normal, no rashes or significant lesions EYES: normal, Conjunctiva are pink and non-injected, sclera clear OROPHARYNX:no exudate, no erythema and lips, buccal mucosa, and tongue normal  NECK: supple, thyroid normal size, non-tender, without nodularity LYMPH:  no palpable lymphadenopathy in the cervical, axillary or inguinal LUNGS: clear to auscultation and percussion with normal breathing effort HEART: regular rate & rhythm and no murmurs and no lower extremity edema ABDOMEN:abdomen soft, non-tender and normal bowel sounds MUSCULOSKELETAL:no cyanosis of digits and no clubbing  NEURO: alert & oriented x 3 with fluent speech, no focal motor/sensory deficits EXTREMITIES: No lower extremity edema  LABORATORY DATA:  I have reviewed the data as listed   Chemistry      Component Value Date/Time   NA 141 03/16/2016 1015   K 3.6 03/16/2016 1015   CO2 27 03/16/2016 1015   BUN 8.8 03/16/2016 1015   CREATININE 0.9 03/16/2016 1015      Component Value Date/Time   CALCIUM 9.8 03/16/2016 1015   ALKPHOS 78 03/16/2016 1015   AST 17 03/16/2016 1015   ALT 17 03/16/2016 1015   BILITOT 0.61 03/16/2016 1015       Lab Results  Component Value Date   WBC 5.3 03/16/2016   HGB 15.8 03/16/2016   HCT 48.4* 03/16/2016   MCV 89.0 03/16/2016   PLT 238  03/16/2016   NEUTROABS 3.2 03/16/2016     ASSESSMENT & PLAN:  Breast cancer of upper-outer quadrant of right female breast ([place]) Recurrent right breast cancer initially DCIS treated with mastectomy followed by reconstruction and later developed chest wall recurrence in 2013 ER/PR positive HER-2 negative, Misty Arnold underwent alternative therapies in [place] but could not afford the trips and hence she has not been on any breast cancer therapy for a long time.  PET/CT scan 11/12/2014 did not show metastatic disease but showed extensive involvement in the right breast in an around the implant Stage 3B  Right mastectomy 08/29/2015 at [place] (Dr.Singh): IDC with invol of skin and skin ulceration, breast capsule inv cancer, superior medial margin positive, 1/1 LN positive, grade 3, 14.3 cm, 3.5 cm, ALI, chest wall involv, ER 90-100%, PR 80-90%, HER-2 negative, Ki 67 40-50% T4CN1 (St 3B)  Treatment plan: (Misty Arnold is premenopausal based on blood work done May 2017, estradiol 250, FSH 5.4) 1. Misty Arnold started tamoxifen but developed severe headache with severe hypertension and tamoxifen was discontinued 03/17/2016. Blood pressure is slightly better. I instructed her to start back on 10 mg daily. If her blood pressure remained stable, then she'll increase it to half a tablet twice a day. We  discussed other options including ovarian suppression/oophorectomy with AI therapy. If she cannot tolerate tamoxifen than that would be our only option.  Misty Arnold travels extensively for [place] and is looking to apply for disability.  Return to clinic 06/05/2016. 2. Severe hypertension: I instructed the Misty Arnold to follow with the primary care physician's instructions regarding her blood pressure meds. She is currently on Norvasc and hydrochlorothiazide.  No orders of the defined types were placed in this encounter.   The Misty Arnold has a good understanding of the overall plan. she agrees with it. she will call with any problems  that may develop before the next visit here.   Rulon Eisenmenger, MD 04/03/2016

## 2016-06-04 ENCOUNTER — Telehealth: Payer: Self-pay | Admitting: *Deleted

## 2016-06-04 NOTE — Telephone Encounter (Signed)
"  Prim Pro 75 mg once daily is what I'm taking instead of the tamoxifen.  I'm tolerating this very well.  My Blood pressure is in normal range.  No sweating and no arm pain.   Secondly, I need to meet with someone to complete the P.O.A. And living will forms I was given.  My next appointment is tomorrow."  Called collaborative nurse to notify as appointment is at [place] am.  .

## 2016-06-05 ENCOUNTER — Telehealth: Payer: Self-pay | Admitting: Hematology and Oncology

## 2016-06-05 ENCOUNTER — Other Ambulatory Visit: Payer: Self-pay

## 2016-06-05 ENCOUNTER — Encounter: Payer: Self-pay | Admitting: *Deleted

## 2016-06-05 ENCOUNTER — Ambulatory Visit: Payer: Self-pay | Admitting: Hematology and Oncology

## 2016-06-05 DIAGNOSIS — C50411 Malignant neoplasm of upper-outer quadrant of right female breast: Secondary | ICD-10-CM

## 2016-06-05 NOTE — Progress Notes (Signed)
Called pt to inquire about missed appt as well as information relayed to me via triage.  Pt reports she called around 6 this morning to cancel appointment and left message with [facility].  POF entered to reschedule for next week per pt request.  Pt reported taking new medication in place of tamoxifen as pt has had side effects.  Pt reports this medication as Dim Pro 75mg  daily.  Pt also called about filling out paperwork for POA and living will.  I gave pt the number for social work to schedule time to complete this paperwork.  Pt without further questions or concerns at time of call.  After telephone call, I reviewed this medication as I was not familiar with it.  It is found to be a supplement to promote estrogen metabolism.  Dr. Lindi Adie notified as pt ER/PR positive.  Will call pt back to discuss as this is not a recommended medication given her history.

## 2016-06-05 NOTE — Assessment & Plan Note (Deleted)
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  PET/CT scan 11/12/2014 did not show metastatic disease but showed extensive involvement in the right breast in an around the implant Stage 3B  Right mastectomy 08/29/2015 at [place] (Dr.Singh): IDC with invol of skin and skin ulceration, breast capsule inv cancer, superior medial margin positive, 1/1 LN positive, grade 3, 14.3 cm, 3.5 cm, ALI, chest wall involv, ER 90-100%, PR 80-90%, HER-2 negative, Ki 67 40-50% T4CN1 (St 3B)  Treatment plan: (Patient is premenopausal based on blood work done May 2017, estradiol 250, FSH 5.4) 1. Patient started tamoxifen but developed severe headache with severe hypertension and tamoxifen was discontinued 03/17/2016. Restarted at 10 mg daily 04/03/2016. If her blood pressure remained stable, then she'll increase it to half a tablet twice a day. We discussed other options including ovarian suppression/oophorectomy with AI therapy. If she cannot tolerate tamoxifen than that would be our only option.  Patient travels extensively for [employer] and is looking to apply for disability.  2. Severe hypertension: I instructed the patient to follow with the primary care physician's instructions regarding her blood pressure meds. She is currently on Norvasc and hydrochlorothiazide.  Return to clinic in 6 months for follow-up

## 2016-06-05 NOTE — Telephone Encounter (Signed)
Spoke with pt to confirm 8/7 appt date/time per pof

## 2016-06-06 NOTE — Progress Notes (Signed)
Per Dr. Geralyn Flash request, called pt to see if she could come in at 2pm 06/14/16 rather than currently scheduled 3:45pm due to scheduling conflict.  Pt confirmed this time would work with her.  POF entered for pt appt to be moved to 2:00.

## 2016-06-11 ENCOUNTER — Other Ambulatory Visit (HOSPITAL_BASED_OUTPATIENT_CLINIC_OR_DEPARTMENT_OTHER): Payer: 59

## 2016-06-11 ENCOUNTER — Encounter: Payer: Self-pay | Admitting: Hematology and Oncology

## 2016-06-11 ENCOUNTER — Encounter: Payer: Self-pay | Admitting: *Deleted

## 2016-06-11 ENCOUNTER — Ambulatory Visit (HOSPITAL_BASED_OUTPATIENT_CLINIC_OR_DEPARTMENT_OTHER): Payer: 59 | Admitting: Hematology and Oncology

## 2016-06-11 DIAGNOSIS — C773 Secondary and unspecified malignant neoplasm of axilla and upper limb lymph nodes: Secondary | ICD-10-CM | POA: Diagnosis not present

## 2016-06-11 DIAGNOSIS — C7989 Secondary malignant neoplasm of other specified sites: Secondary | ICD-10-CM

## 2016-06-11 DIAGNOSIS — C792 Secondary malignant neoplasm of skin: Secondary | ICD-10-CM | POA: Diagnosis not present

## 2016-06-11 DIAGNOSIS — C50411 Malignant neoplasm of upper-outer quadrant of right female breast: Secondary | ICD-10-CM

## 2016-06-11 DIAGNOSIS — Z170 Estrogen receptor positive status [ER+]: Secondary | ICD-10-CM

## 2016-06-11 DIAGNOSIS — I10 Essential (primary) hypertension: Secondary | ICD-10-CM

## 2016-06-11 LAB — COMPREHENSIVE METABOLIC PANEL
ALT: 21 U/L (ref 0–55)
AST: 18 U/L (ref 5–34)
Albumin: 3.9 g/dL (ref 3.5–5.0)
Alkaline Phosphatase: 58 U/L (ref 40–150)
Anion Gap: 9 mEq/L (ref 3–11)
BUN: 8.2 mg/dL (ref 7.0–26.0)
CO2: 24 mEq/L (ref 22–29)
Calcium: 9.6 mg/dL (ref 8.4–10.4)
Chloride: 110 mEq/L — ABNORMAL HIGH (ref 98–109)
Creatinine: 0.9 mg/dL (ref 0.6–1.1)
EGFR: 88 mL/min/{1.73_m2} — ABNORMAL LOW (ref >90)
Glucose: 114 mg/dl (ref 70–140)
Potassium: 3.7 mEq/L (ref 3.5–5.1)
Sodium: 143 mEq/L (ref 136–145)
Total Bilirubin: 0.33 mg/dL (ref 0.20–1.20)
Total Protein: 7.2 g/dL (ref 6.4–8.3)

## 2016-06-11 LAB — CBC WITH DIFFERENTIAL/PLATELET
BASO%: 0.4 % (ref 0.0–2.0)
Basophils Absolute: 0 10*3/uL (ref 0.0–0.1)
EOS%: 0.7 % (ref 0.0–7.0)
Eosinophils Absolute: 0 10*3/uL (ref 0.0–0.5)
HCT: 40.8 % (ref 34.8–46.6)
HGB: 13.9 g/dL (ref 11.6–15.9)
LYMPH%: 37.4 % (ref 14.0–49.7)
MCH: 31.4 pg (ref 25.1–34.0)
MCHC: 34.1 g/dL (ref 31.5–36.0)
MCV: 92.1 fL (ref 79.5–101.0)
MONO#: 0.4 10*3/uL (ref 0.1–0.9)
MONO%: 8.7 % (ref 0.0–14.0)
NEUT#: 2.4 10*3/uL (ref 1.5–6.5)
NEUT%: 52.8 % (ref 38.4–76.8)
Platelets: 236 10*3/uL (ref 145–400)
RBC: 4.43 10*6/uL (ref 3.70–5.45)
RDW: 13.1 % (ref 11.2–14.5)
WBC: 4.5 10*3/uL (ref 3.9–10.3)
lymph#: 1.7 10*3/uL (ref 0.9–3.3)

## 2016-06-11 NOTE — Assessment & Plan Note (Signed)
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  PET/CT scan 11/12/2014 did not show metastatic disease but showed extensive involvement in the right breast in an around the implant Stage 3B  Right mastectomy 08/29/2015 at [facility] (Dr.Singh): IDC with invol of skin and skin ulceration, breast capsule inv cancer, superior medial margin positive, 1/1 LN positive, grade 3, 14.3 cm, 3.5 cm, ALI, chest wall involv, ER 90-100%, PR 80-90%, HER-2 negative, Ki 67 40-50% T4CN1 (St 3B)  Treatment plan: (Patient is premenopausal based on blood work done May 2017, estradiol 250, FSH 5.4) 1. Patient started tamoxifen but developed severe headache with severe hypertension and tamoxifen was discontinued 03/17/2016. Restarted at 10 mg daily 04/03/2016. If her blood pressure remained stable, then she'll increase it to half a tablet twice a day. We discussed other options including ovarian suppression/oophorectomy with AI therapy. If she cannot tolerate tamoxifen than that would be our only option.  Patient travels extensively for [employer] and is looking to apply for disability.  2. Severe hypertension: I instructed the patient to follow with the primary care physician's instructions regarding her blood pressure meds. She is currently on Norvasc and hydrochlorothiazide.  Return to clinic in 6 months for follow-up 

## 2016-06-11 NOTE — Progress Notes (Signed)
Patient Care Team: [name] Patient as PCP - General (General Practice)  DIAGNOSIS: Breast cancer of upper-outer quadrant of right female breast ([place])   Staging form: Breast, AJCC 7th Edition   - Clinical: Stage IIIB (T4b, N0, M0) - Unsigned  SUMMARY OF ONCOLOGIC HISTORY:   Breast cancer of upper-outer quadrant of right female breast ([place])   09/21/2010 Mammogram    right breast linear and segmental pleomorphic calcifications from 4:00 to 6:00 position ultrasound revealed 1.6 cm and 1.1 cm masses     09/27/2010 Initial Biopsy    ultrasound-guided biopsy of all masses showed DCIS grade 2; patient went to cancer treatment centers of [place] for second opinion and delayed therapy     01/26/2011 Surgery    right mastectomy followed by reconstruction: Invasive ductal carcinoma T1 N1 MIC M0 stage IB ER/PR positive HER-2 negative, BRCA negative, Oncotype DX low risk     05/29/2012 Procedure    right chest wall nodule excision done on 06/24/2012 showed metastatic carcinoma margins were positive, but CT scan no metastatic disease, recommended chemotherapy but patient refused also refused reexcision     04/28/2013 Treatment Plan Change    patient went to [place] for alternative treatments and took herbal medications, tonics etc. But she could not afford these trips.     01/08/2014 Breast MRI    right breast multiple enhancing masses within the soft tissues largest 5.6 cm in wall skin surface and the capsule of the silicone prosthesis, contiguous nodules involving in inferomedial breast and tired and subcentimeter nodules across the midline     08/29/2015 Surgery    Right mastectomy: IDC with invol of skin and skin ulceration, breast capsule inv cancer, superior medial margin positive, 1/1 LN positive, grade 3, 14.3 cm, 3.5 cm, ALI, chest wall involv, ER 90-100%, PR 80-90%, HER-2 negative, Ki 67 40-50% T4CN1 (St 3B)     03/05/2016 -  Anti-estrogen oral therapy    Tamoxifen 20 mg daily stopped due  to headache and uncontrolled hypertension. Decrease to 10 mg daily 04/03/2016, stopped June 2017 and now takes an estrogen metabolizer over-the-counter      CHIEF COMPLIANT: Right chest wall nodule follow-up  INTERVAL HISTORY: Misty Arnold is a 45 year old with above-mentioned history of chest wall recurrence of breast cancer could not tolerate tamoxifen therapy. Elevated milligram dose she could not tolerate. She is currently on an estrogen metabolizer but apparently converts estrogen into favorable molecules. She reports that after she started taking it the chest wall lump is smaller.  REVIEW OF SYSTEMS:   Constitutional: Denies fevers, chills or abnormal weight loss Eyes: Denies blurriness of vision Ears, nose, mouth, throat, and face: Denies mucositis or sore throat Respiratory: Denies cough, dyspnea or wheezes Cardiovascular: Denies palpitation, chest discomfort Gastrointestinal:  Denies nausea, heartburn or change in bowel habits Skin: Denies abnormal skin rashes Lymphatics: Denies new lymphadenopathy or easy bruising Neurological:Denies numbness, tingling or new weaknesses Behavioral/Psych: Mood is stable, no new changes  Extremities: No lower extremity edema Breast:  Right chest wall lump All other systems were reviewed with the patient and are negative.  I have reviewed the past medical history, past surgical history, social history and family history with the patient and they are unchanged from previous note.  ALLERGIES:  has No Known Allergies.  MEDICATIONS:  Current Outpatient Prescriptions  Medication Sig Dispense Refill  . amLODipine (NORVASC) 10 MG tablet Take by mouth.    Marland Kitchen Bioflavonoid Products (ESTER C PO) Take by mouth.    Marland Kitchen  BLACK CURRANT SEED OIL PO Take by mouth.    . Cholecalciferol (VITAMIN D PO) Take by mouth.    . Diindolylmethane POWD Take 75 mg by mouth daily.    Marland Kitchen escitalopram (LEXAPRO) 10 MG tablet     . Fish Oil OIL by Does not apply route.    .  Flaxseed Oil OIL by Does not apply route.    . Misc Natural Products (DANDELION ROOT PO) Take by mouth.    . tamoxifen (NOLVADEX) 20 MG tablet Take 1 tablet (20 mg total) by mouth daily. 90 tablet 3  . [place], Absinthium, Oil OIL by Does not apply route.     No current facility-administered medications for this visit.     PHYSICAL EXAMINATION: ECOG PERFORMANCE STATUS: 1 - Symptomatic but completely ambulatory  Vitals:   06/11/16 1356  BP: (!) 144/86  Pulse: 75  Resp: 18  Temp: 98.9 F (37.2 C)   Filed Weights   06/11/16 1356  Weight: 143 lb 1.6 oz (64.9 kg)    GENERAL:alert, no distress and comfortable SKIN: skin color, texture, turgor are normal, no rashes or significant lesions EYES: normal, Conjunctiva are pink and non-injected, sclera clear OROPHARYNX:no exudate, no erythema and lips, buccal mucosa, and tongue normal  NECK: supple, thyroid normal size, non-tender, without nodularity LYMPH:  no palpable lymphadenopathy in the cervical, axillary or inguinal LUNGS: clear to auscultation and percussion with normal breathing effort HEART: regular rate & rhythm and no murmurs and no lower extremity edema ABDOMEN:abdomen soft, non-tender and normal bowel sounds MUSCULOSKELETAL:no cyanosis of digits and no clubbing  NEURO: alert & oriented x 3 with fluent speech, no focal motor/sensory deficits EXTREMITIES: No lower extremity edema BREAST: Right chest wall lump 2 x 5 cm. (exam performed in the presence of a chaperone)  LABORATORY DATA:  I have reviewed the data as listed   Chemistry      Component Value Date/Time   NA 143 06/11/2016 1340   K 3.7 06/11/2016 1340   CO2 24 06/11/2016 1340   BUN 8.2 06/11/2016 1340   CREATININE 0.9 06/11/2016 1340      Component Value Date/Time   CALCIUM 9.6 06/11/2016 1340   ALKPHOS 58 06/11/2016 1340   AST 18 06/11/2016 1340   ALT 21 06/11/2016 1340   BILITOT 0.33 06/11/2016 1340       Lab Results  Component Value Date   WBC  4.5 06/11/2016   HGB 13.9 06/11/2016   HCT 40.8 06/11/2016   MCV 92.1 06/11/2016   PLT 236 06/11/2016   NEUTROABS 2.4 06/11/2016     ASSESSMENT & PLAN:  Breast cancer of upper-outer quadrant of right female breast ([place]) 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  PET/CT scan 11/12/2014 did not show metastatic disease but showed extensive involvement in the right breast in an around the implant Stage 3B  Right mastectomy 08/29/2015 at [facility] (Dr.Singh): IDC with invol of skin and skin ulceration, breast capsule inv cancer, superior medial margin positive, 1/1 LN positive, grade 3, 14.3 cm, 3.5 cm, ALI, chest wall involv, ER 90-100%, PR 80-90%, HER-2 negative, Ki 67 40-50% T4CN1 (St 3B)  Treatment plan: (Patient is premenopausal based on blood work done May 2017, estradiol 250, FSH 5.4) 1.  Patient started tamoxifen but developed severe headache with severe hypertension and tamoxifen was discontinued 03/17/2016. Restarted at 10 mg daily 04/03/2016 and stopped June 2017 due to hot flashes  We discussed other options including ovarian suppression/oophorectomy with AI therapy. If she cannot tolerate tamoxifen than that would be our only option. Patient started on an estrogen metabolizer and she things of the lump is getting smaller. I discussed with her that we are not certain of the benefits of such products but its patient's choice and we will see if she does respond to the supplement.  Patient travels extensively for [employer] and is looking to apply for disability.  2. Severe hypertension: I instructed the patient to follow with the primary care physician's instructions regarding her blood pressure meds. She is  currently on Norvasc and hydrochlorothiazide.  Return to clinic in 3 months for follow-up after breast ultrasound     Orders Placed This Encounter  Procedures  . US BREAST LTD UNI RIGHT INC AXILLA    Standing Status:   Future    Standing Expiration Date:   08/11/2017    Order Specific Question:   Reason for Exam (SYMPTOM  OR DIAGNOSIS REQUIRED)    Answer:   Right chest wall mass    Order Specific Question:   Preferred imaging location?    Answer:   [facility]   The patient has a good understanding of the overall plan. she agrees with it. she will call with any problems that may develop before the next visit here.   Rulon Eisenmenger, MD 06/11/16

## 2016-06-12 ENCOUNTER — Telehealth: Payer: Self-pay | Admitting: Hematology and Oncology

## 2016-06-12 NOTE — Telephone Encounter (Signed)
Spoke with patient to confirm Nov appt dates/times. Gave pt [facility] contact information to send previous images per office request

## 2016-06-13 NOTE — Progress Notes (Signed)
[facility] Work  Clinical Social Work was referred by medical oncology RN to review and complete healthcare advance directives.  Clinical Social Worker met with patient in [place] office.  Patient did not complete living will, stated she would like for her healthcare agent to make decisions about her end of life care in regards to life prolonging measures.   Clinical Social Worker notarized documents and made copies for patient/family. Clinical Social Worker will gave copy to patient's medical oncologist to be scanned into patient's chart. Clinical Social Worker encouraged patient/family to contact with any additional questions or concerns.  Polo Riley, MSW, [place] Worker [facility] [phone]

## 2016-07-30 ENCOUNTER — Telehealth: Payer: Self-pay | Admitting: *Deleted

## 2016-07-30 NOTE — Telephone Encounter (Signed)
Pt would like to know who Dr Lindi Adie would recommend for surgery. She does not want to go to [place] due to the drive, prefers to stay in [place].

## 2016-07-30 NOTE — Telephone Encounter (Signed)
Please send referral to Dr.Wakefield Thx VG

## 2016-07-31 ENCOUNTER — Telehealth: Payer: Self-pay | Admitting: *Deleted

## 2016-07-31 ENCOUNTER — Other Ambulatory Visit: Payer: Self-pay | Admitting: *Deleted

## 2016-07-31 DIAGNOSIS — C50411 Malignant neoplasm of upper-outer quadrant of right female breast: Secondary | ICD-10-CM

## 2016-07-31 NOTE — Telephone Encounter (Signed)
Faxed referral to Dr Cristal Generous office

## 2016-07-31 NOTE — Telephone Encounter (Signed)
Patient returning nurse call

## 2016-09-11 ENCOUNTER — Other Ambulatory Visit: Payer: Self-pay

## 2016-09-16 NOTE — Assessment & Plan Note (Deleted)
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  PET/CT scan 11/12/2014 did not show metastatic disease but showed extensive involvement in the right breast in an around the implant Stage 3B  Right mastectomy 08/29/2015 at [facility] (Dr.Singh): IDC with invol of skin and skin ulceration, breast capsule inv cancer, superior medial margin positive, 1/1 LN positive, grade 3, 14.3 cm, 3.5 cm, ALI, chest wall involv, ER 90-100%, PR 80-90%, HER-2 negative, Ki 67 40-50% T4CN1 (St 3B)  Treatment plan:(Patient is premenopausal based on blood work done May 2017, estradiol 250, FSH 5.4) 1. Patient started tamoxifen but developed severe headache with severe hypertension and tamoxifen was discontinued 03/17/2016. Restarted at 10 mg daily 04/03/2016 and stopped June 2017 due to hot flashes  If she cannot tolerate tamoxifen than AI inh would be our only option.  Patient started on an estrogen metabolizer and she things of the lump is getting smaller. I discussed with her that we are not certain of the benefits of such products but its patient's choice and we will see if she does respond to the supplement.  Patient travels extensively for [place] and is looking to apply for disability.  2. Severe hypertension: I instructed the patient to follow with the primary care physician's instructions regarding her blood pressure meds. She is currently on Norvasc and hydrochlorothiazide.  US breast: Not done

## 2016-09-17 ENCOUNTER — Other Ambulatory Visit: Payer: Self-pay

## 2016-09-17 ENCOUNTER — Ambulatory Visit: Payer: Self-pay | Admitting: Hematology and Oncology

## 2016-10-03 ENCOUNTER — Other Ambulatory Visit: Payer: Self-pay

## 2016-10-16 ENCOUNTER — Other Ambulatory Visit: Payer: Self-pay

## 2017-03-07 ENCOUNTER — Other Ambulatory Visit: Payer: Self-pay | Admitting: Emergency Medicine

## 2017-03-07 ENCOUNTER — Telehealth: Payer: Self-pay | Admitting: Emergency Medicine

## 2017-03-07 MED ORDER — EXEMESTANE 25 MG PO TABS: 25.0000 mg | ORAL_TABLET | Freq: Every day | ORAL | 3 refills | Status: DC

## 2017-03-07 NOTE — Telephone Encounter (Signed)
Patient states she is in [place] at this time and will call and schedule a follow up appointment once she is back in [place]. Ok to refill Aromasin per Dr Lindi Adie.

## 2017-03-07 NOTE — Telephone Encounter (Signed)
Patient called requesting a prescription for Aromasin 25mg . Patient states she was prescribed this while in [place] for a trial.

## 2017-03-11 ENCOUNTER — Other Ambulatory Visit: Payer: Self-pay

## 2017-03-11 MED ORDER — EXEMESTANE 25 MG PO TABS: 25.0000 mg | ORAL_TABLET | Freq: Every day | ORAL | 3 refills | Status: DC

## 2017-03-11 NOTE — Progress Notes (Signed)
Pt requesting for Aromasin refill. Pt wanted to make sure that we sent prescription for the brand name and not generic. Will change to brand name and contact pt pharmacy to notify them of change.Pt aware.

## 2017-03-20 ENCOUNTER — Other Ambulatory Visit: Payer: Self-pay | Admitting: Emergency Medicine

## 2017-03-20 MED ORDER — EXEMESTANE 25 MG PO TABS: 25.0000 mg | ORAL_TABLET | Freq: Every day | ORAL | 3 refills | Status: DC

## 2017-04-17 ENCOUNTER — Telehealth: Payer: Self-pay

## 2017-04-17 NOTE — Telephone Encounter (Signed)
Called pt to confirm time/date of appt 05/06/17 at 1145am. Pt requesting to have earlier appt if there was any cancellations. Told pt will let her know and call her. Pt verbalized understanding.

## 2017-04-18 ENCOUNTER — Telehealth: Payer: Self-pay | Admitting: *Deleted

## 2017-04-18 NOTE — Telephone Encounter (Signed)
Received voicemail: "I had a panic attack back in March with a little chest pain around my heart.  Does Dr. Lindi Adie want to do any test when I see him to evaluate my heart or does he think I need referral to cardiologist?  Place me on your cancellation list to be seen sooner.  Return number [phone]."    Spoke with patient.  "I was out of the country in [place] with my mother.  She got sick.  While in hospital with her I was stressed and anxiety of my breast cancer I couldn't breath, heart was swelling, feeling my heartbeat slowing down.  I was checked in [place] with [facility].  [place] staff said it was a panic attack and my heart is fine.  I just want to be sure nothing else happened.  It's not very often but once a month I feel this pulling on left side above my heart.  The tumor and lymph node are in this area above my heart.  This could where the pulling is coming from."  Will route to provider.  Patient aware provider returns 04-29-2017.  This nurse instructed her not to wait until F/U but to report to nearest ED ASAP for symptoms described.    "I feel okay now.  His nurse has me on the list to come in sooner."    Again advised dial 911 to receive higher level of care, evaluation as quickly as possible if this reoccurs.  Other symptoms that can occur with chest pain are feeling faint, trouble breathing, perspiring are warnings of serious change in health and wellness.  Better outcomes the sooner health care assistance set in motion.

## 2017-04-19 NOTE — Telephone Encounter (Signed)
Called pt back to advise her that if this is a major concern for her, she needs to call her primary care dr and have this checked out since Dr.Gudena is out of the office next week. Pt states that she had her heart checked in [place], but so far, she has not had any panic attacks and chest pain. Told pt that if she develops chest pain symptoms to go to the ED. Pt can have an EKG done at her visit with Dr.Gudena in the next few weeks. Pt verbalized understanding and will seek emergency services if her symptoms comes back or gets worse before her scheduled appt.

## 2017-05-01 ENCOUNTER — Encounter: Payer: Self-pay | Admitting: Hematology and Oncology

## 2017-05-01 ENCOUNTER — Other Ambulatory Visit: Payer: Self-pay

## 2017-05-01 ENCOUNTER — Telehealth: Payer: Self-pay | Admitting: *Deleted

## 2017-05-01 ENCOUNTER — Ambulatory Visit (HOSPITAL_BASED_OUTPATIENT_CLINIC_OR_DEPARTMENT_OTHER): Payer: Self-pay

## 2017-05-01 ENCOUNTER — Ambulatory Visit (HOSPITAL_BASED_OUTPATIENT_CLINIC_OR_DEPARTMENT_OTHER): Payer: Self-pay | Admitting: Hematology and Oncology

## 2017-05-01 VITALS — BP 166/93 | HR 84 | Temp 98.4°F | Resp 17 | Ht 65.0 in | Wt 110.9 lb

## 2017-05-01 DIAGNOSIS — C50411 Malignant neoplasm of upper-outer quadrant of right female breast: Secondary | ICD-10-CM

## 2017-05-01 DIAGNOSIS — Z170 Estrogen receptor positive status [ER+]: Secondary | ICD-10-CM

## 2017-05-01 DIAGNOSIS — Z79811 Long term (current) use of aromatase inhibitors: Secondary | ICD-10-CM

## 2017-05-01 DIAGNOSIS — C773 Secondary and unspecified malignant neoplasm of axilla and upper limb lymph nodes: Secondary | ICD-10-CM

## 2017-05-01 DIAGNOSIS — I10 Essential (primary) hypertension: Secondary | ICD-10-CM

## 2017-05-01 NOTE — Telephone Encounter (Signed)
Pt called wanting to know if she could be seen today by Dr. Lindi Adie since pt will be in [place].  Currently has office visit appt on 05/06/17.  Pt lives in [place].   May, RN notified.   Called pt back and lleft message on voice mail informing pt that Dr. Lindi Adie can see pt today at 2 pm.   Asked pt to call triage nurse back to confirm receiving message. Pt's    Phone      [phone].

## 2017-05-01 NOTE — Progress Notes (Signed)
Patient Care Team: Patient, [name] as PCP - General (General Practice)  DIAGNOSIS:  Encounter Diagnosis  Name Primary?  . 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; patient went to cancer treatment centers of [place] for second opinion and delayed therapy      01/26/2011 Surgery    right mastectomy followed by reconstruction: Invasive ductal carcinoma T1 N1 MIC M0 stage IB ER/PR positive HER-2 negative, BRCA negative, Oncotype DX low risk      05/29/2012 Procedure    right chest wall nodule excision done on 06/24/2012 showed metastatic carcinoma margins were positive, but CT scan no metastatic disease, recommended chemotherapy but patient refused also refused reexcision      04/28/2013 Treatment Plan Change    patient went to [place] for alternative treatments and took herbal medications, tonics etc. But she could not afford these trips.      01/08/2014 Breast MRI    right breast multiple enhancing masses within the soft tissues largest 5.6 cm in wall skin surface and the capsule of the silicone prosthesis, contiguous nodules involving in inferomedial breast and tired and subcentimeter nodules across the midline      08/29/2015 Surgery    Right mastectomy: IDC with invol of skin and skin ulceration, breast capsule inv cancer, superior medial margin positive, 1/1 LN positive, grade 3, 14.3 cm, 3.5 cm, ALI, chest wall involv, ER 90-100%, PR 80-90%, HER-2 negative, Ki 67 40-50% T4CN1 (St 3B)      03/05/2016 -  Anti-estrogen oral therapy    Tamoxifen 20 mg daily  stopped due to headache and uncontrolled hypertension. Decrease to 10 mg daily 04/03/2016, stopped June 2017 and took an estrogen metabolizer over-the-counter; started Aromasin April 2018 from a [facility] COMPLIANT: Patient returned from [place] currently taking Aromasin  INTERVAL HISTORY: Misty Arnold is a 46 year old lady with a large fungating tumor in the breast with multiple large satellite nodules. She had been in [place] for the past 6 months and saw a physician there who prescribed her Aromasin. She has been on it for the past 3 months and reports that after the first month she has noticed improvement in the tumor. Subsequently she's been on generic exemestane and she feels that it is not responding as much. She reports that there are episodes when the tumor bleeds profusely. It is unclear why Aromasin was given a cause she is   not yet menopausal based on the previous blood work. Patient tells me that she applies essential oils to the tumor and that causes the tumor to necrose and sometimes that leads to bleeding.  REVIEW OF SYSTEMS:   Constitutional: Denies fevers, chills or abnormal weight loss Eyes: Denies blurriness of vision Ears, nose, mouth, throat, and face: Denies mucositis or sore throat Respiratory: Denies cough, dyspnea or wheezes Cardiovascular: Denies palpitation, chest discomfort Gastrointestinal:  Denies nausea, heartburn or change in bowel habits Skin: Denies abnormal skin rashes Lymphatics: Denies new lymphadenopathy or easy bruising Neurological:Denies numbness, tingling or new weaknesses Behavioral/Psych: Mood is stable, no  new changes  Extremities: No lower extremity edema Breast:  Large fungating tumor in the right chest wall and axilla along with multiple satellite nodules  I have reviewed the past medical history, past surgical history, social history and family history with the patient and they are unchanged from previous  note.  ALLERGIES:  has No Known Allergies.  MEDICATIONS:  Current Outpatient Prescriptions  Medication Sig Dispense Refill  . amLODipine (NORVASC) 10 MG tablet Take by mouth.    Marland Kitchen Bioflavonoid Products (ESTER C PO) Take by mouth.    Marland Kitchen BLACK CURRANT SEED OIL PO Take by mouth.    . Cholecalciferol (VITAMIN D PO) Take by mouth.    . Diindolylmethane POWD Take 75 mg by mouth daily.    Marland Kitchen escitalopram (LEXAPRO) 10 MG tablet     . exemestane (AROMASIN) 25 MG tablet Take 1 tablet (25 mg total) by mouth daily after breakfast. 90 tablet 3  . Fish Oil OIL by Does not apply route.    . Flaxseed Oil OIL by Does not apply route.    . Misc Natural Products (DANDELION ROOT PO) Take by mouth.    . [place], Absinthium, Oil OIL by Does not apply route.     No current facility-administered medications for this visit.     PHYSICAL EXAMINATION: ECOG PERFORMANCE STATUS: 1 - Symptomatic but completely ambulatory  Vitals:   05/01/17 1545  BP: (!) 166/93  Pulse: 84  Resp: 17  Temp: 98.4 F (36.9 C)   Filed Weights   05/01/17 1545  Weight: 110 lb 14.4 oz (50.3 kg)    GENERAL:alert, no distress and comfortable SKIN: skin color, texture, turgor are normal, no rashes or significant lesions EYES: normal, Conjunctiva are pink and non-injected, sclera clear OROPHARYNX:no exudate, no erythema and lips, buccal mucosa, and tongue normal  NECK: supple, thyroid normal size, non-tender, without nodularity LYMPH:  no palpable lymphadenopathy in the cervical, axillary or inguinal LUNGS: clear to auscultation and percussion with normal breathing effort HEART: regular rate & rhythm and no murmurs and no lower extremity edema ABDOMEN:abdomen soft, non-tender and normal bowel sounds MUSCULOSKELETAL:no cyanosis of digits and no clubbing  NEURO: alert & oriented x 3 with fluent speech, no focal motor/sensory deficits EXTREMITIES: No lower extremity edema BREAST:Large fungating mass with necrotic features along  with satellite nodules that are hard (exam performed in the presence of a chaperone)  LABORATORY DATA:  I have reviewed the data as listed   Chemistry      Component Value Date/Time   NA 143 06/11/2016 1340   K 3.7 06/11/2016 1340   CO2 24 06/11/2016 1340   BUN 8.2 06/11/2016 1340   CREATININE 0.9 06/11/2016 1340      Component Value Date/Time   CALCIUM 9.6 06/11/2016 1340   ALKPHOS 58 06/11/2016 1340   AST 18 06/11/2016 1340   ALT 21 06/11/2016 1340   BILITOT 0.33 06/11/2016 1340       Lab Results  Component Value Date   WBC 4.5 06/11/2016   HGB 13.9 06/11/2016   HCT 40.8 06/11/2016   MCV 92.1 06/11/2016   PLT 236 06/11/2016   NEUTROABS 2.4 06/11/2016    ASSESSMENT & PLAN:  Breast cancer of upper-outer quadrant of right female breast ([place]) Recurrent right breast cancer initially DCIS treated with mastectomy followed by reconstruction and later developed chest wall recurrence in 2013 ER/PR positive HER-2 negative, patient underwent alternative therapies in [place] but could not afford the trips and hence she has not  been on any breast cancer therapy for a long time.  PET/CT scan 11/12/2014 did not show metastatic disease but showed extensive involvement in the right breast in an around the implant Stage 3B  Right mastectomy 08/29/2015 at [place] (Dr.Singh): IDC with invol of skin and skin ulceration, breast capsule inv cancer, superior medial margin positive, 1/1 LN positive, grade 3, 14.3 cm, 3.5 cm, ALI, chest wall involv, ER 90-100%, PR 80-90%, HER-2 negative, Ki 67 40-50% T4CN1 (St 3B)  Treatment plan: (Patient is premenopausal based on blood work done May 2017, estradiol 250, FSH 5.4) 1. Patient started tamoxifen but developed severe headache with severe hypertension and tamoxifen was discontinued 03/17/2016.   Patient spent the last 6 months in [place] and was started on Aromasin in April 2018. This was prescribed by a [place] physician. She tells me that initially  she felt that the tumor was shrinking. But over the past couple of months she had been taking the generic Aromasin and she feels that the tumor has stopped shrinking.  I discussed with her that I suspect that she is still premenopausal and that Aromasin would not be the right choice for her. She would like [name] to check estradiol levels today.  I am not actively managing her breast cancer. I'm helping her symptomatically and also supporting her emotionally. I do not believe her treatment plan is going to be helpful to her. Based on the large size of the tumor, I recommend systemic chemotherapy with dose dense Adriamycin Cytoxan followed by Taxol to shrink it down and then plan for resection followed by radiation.  She may also need body scans to evaluate if she has metastatic disease.  2. Severe hypertension:  Return to clinic as needed  I spent 25 minutes talking to the patient of which more than half was spent in counseling and coordination of care.  Orders Placed This Encounter  Procedures  . Estradiol, Ultra Sens   The patient has a good understanding of the overall plan. she agrees with it. she will call with any problems that may develop before the next visit here.   Rulon Eisenmenger, MD 05/01/17

## 2017-05-01 NOTE — Assessment & Plan Note (Signed)
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  PET/CT scan 11/12/2014 did not show metastatic disease but showed extensive involvement in the right breast in an around the implant Stage 3B  Right mastectomy 08/29/2015 at [facility] (Dr.Singh): IDC with invol of skin and skin ulceration, breast capsule inv cancer, superior medial margin positive, 1/1 LN positive, grade 3, 14.3 cm, 3.5 cm, ALI, chest wall involv, ER 90-100%, PR 80-90%, HER-2 negative, Ki 67 40-50% T4CN1 (St 3B)  Treatment plan: (Patient is premenopausal based on blood work done May 2017, estradiol 250, FSH 5.4) 1. Patient started tamoxifen but developed severe headache with severe hypertension and tamoxifen was discontinued 03/17/2016.   Patient spent the last 6 months in [place] and was started on Aromasin in April 2018. This was prescribed by a [place] physician. She tells me that initially she felt that the tumor was shrinking. But over the past couple of months she had been taking the generic Aromasin and she feels that the tumor has stopped shrinking.  I discussed with her that I suspect that she is still premenopausal and that Aromasin would not be the right choice for her. She would like [name] to check estradiol levels today.  I am not actively managing her breast cancer. I'm helping her symptomatically and also supporting her emotionally. I do not believe her treatment plan is going to be helpful to her. Based on the large size of the tumor, I recommend systemic chemotherapy with dose dense Adriamycin Cytoxan followed by Taxol to shrink it down and then plan for resection followed by radiation.  She may also need body scans to evaluate if she has  metastatic disease.   Patient [place] extensively for [place] and is looking to apply for disability.  2. Severe hypertension:  Return to clinic as needed

## 2017-05-03 ENCOUNTER — Other Ambulatory Visit: Payer: Self-pay

## 2017-05-03 LAB — ESTRADIOL, ULTRA SENS: Estradiol, Sensitive: 43 pg/mL

## 2017-05-03 MED ORDER — EXEMESTANE 25 MG PO TABS: 25.0000 mg | ORAL_TABLET | Freq: Every day | ORAL | 0 refills | Status: DC

## 2017-05-06 ENCOUNTER — Ambulatory Visit: Payer: Self-pay | Admitting: Hematology and Oncology

## 2017-05-21 ENCOUNTER — Telehealth: Payer: Self-pay | Admitting: Medical Oncology

## 2017-05-21 NOTE — Telephone Encounter (Signed)
Will notify Dr.Gudena and follow up with pt. Thank you.

## 2017-05-21 NOTE — Telephone Encounter (Signed)
She wants to start Zoladex asap.

## 2017-05-23 ENCOUNTER — Telehealth: Payer: Self-pay | Admitting: Hematology and Oncology

## 2017-05-23 NOTE — Telephone Encounter (Signed)
Phone went straight to voicemail. Left message to inform pt of lab/inj appt 7/25 at 1400 per sch msg

## 2017-05-28 ENCOUNTER — Other Ambulatory Visit: Payer: Self-pay | Admitting: Hematology and Oncology

## 2017-05-28 ENCOUNTER — Other Ambulatory Visit: Payer: Self-pay

## 2017-05-28 DIAGNOSIS — C50411 Malignant neoplasm of upper-outer quadrant of right female breast: Secondary | ICD-10-CM

## 2017-05-28 DIAGNOSIS — Z170 Estrogen receptor positive status [ER+]: Secondary | ICD-10-CM

## 2017-05-29 ENCOUNTER — Other Ambulatory Visit (HOSPITAL_BASED_OUTPATIENT_CLINIC_OR_DEPARTMENT_OTHER): Payer: Medicare Other

## 2017-05-29 ENCOUNTER — Other Ambulatory Visit: Payer: Self-pay

## 2017-05-29 ENCOUNTER — Ambulatory Visit (HOSPITAL_BASED_OUTPATIENT_CLINIC_OR_DEPARTMENT_OTHER): Payer: Medicare Other

## 2017-05-29 VITALS — BP 150/70 | HR 95 | Temp 98.0°F | Resp 18

## 2017-05-29 DIAGNOSIS — Z170 Estrogen receptor positive status [ER+]: Secondary | ICD-10-CM

## 2017-05-29 DIAGNOSIS — Z5111 Encounter for antineoplastic chemotherapy: Secondary | ICD-10-CM | POA: Diagnosis present

## 2017-05-29 DIAGNOSIS — C50411 Malignant neoplasm of upper-outer quadrant of right female breast: Secondary | ICD-10-CM

## 2017-05-29 LAB — COMPREHENSIVE METABOLIC PANEL
ALT: 9 U/L (ref 0–55)
AST: 22 U/L (ref 5–34)
Albumin: 3.9 g/dL (ref 3.5–5.0)
Alkaline Phosphatase: 85 U/L (ref 40–150)
Anion Gap: 9 mEq/L (ref 3–11)
BUN: 13.5 mg/dL (ref 7.0–26.0)
CO2: 25 mEq/L (ref 22–29)
Calcium: 11.3 mg/dL — ABNORMAL HIGH (ref 8.4–10.4)
Chloride: 103 mEq/L (ref 98–109)
Creatinine: 1.1 mg/dL (ref 0.6–1.1)
EGFR: 70 mL/min/{1.73_m2} — ABNORMAL LOW (ref >90)
Glucose: 143 mg/dl — ABNORMAL HIGH (ref 70–140)
Potassium: 3.5 mEq/L (ref 3.5–5.1)
Sodium: 137 mEq/L (ref 136–145)
Total Bilirubin: <0.22 mg/dL (ref 0.20–1.20)
Total Protein: 7.7 g/dL (ref 6.4–8.3)

## 2017-05-29 LAB — CBC WITH DIFFERENTIAL/PLATELET
BASO%: 1 % (ref 0.0–2.0)
Basophils Absolute: 0.1 10*3/uL (ref 0.0–0.1)
EOS%: 1.4 % (ref 0.0–7.0)
Eosinophils Absolute: 0.1 10*3/uL (ref 0.0–0.5)
HCT: 32.1 % — ABNORMAL LOW (ref 34.8–46.6)
HGB: 10.6 g/dL — ABNORMAL LOW (ref 11.6–15.9)
LYMPH%: 28.8 % (ref 14.0–49.7)
MCH: 29 pg (ref 25.1–34.0)
MCHC: 32.9 g/dL (ref 31.5–36.0)
MCV: 88.4 fL (ref 79.5–101.0)
MONO#: 0.3 10*3/uL (ref 0.1–0.9)
MONO%: 5 % (ref 0.0–14.0)
NEUT#: 4.4 10*3/uL (ref 1.5–6.5)
NEUT%: 63.8 % (ref 38.4–76.8)
Platelets: 428 10*3/uL — ABNORMAL HIGH (ref 145–400)
RBC: 3.64 10*6/uL — ABNORMAL LOW (ref 3.70–5.45)
RDW: 13.4 % (ref 11.2–14.5)
WBC: 6.9 10*3/uL (ref 3.9–10.3)
lymph#: 2 10*3/uL (ref 0.9–3.3)

## 2017-05-29 MED ORDER — GOSERELIN ACETATE 3.6 MG ~~LOC~~ IMPL
3.6000 mg | DRUG_IMPLANT | Freq: Once | SUBCUTANEOUS | Status: AC
  Administered 2017-05-29: 3.6 mg via SUBCUTANEOUS
  Filled 2017-05-29: qty 3.6

## 2017-05-29 MED ORDER — LIDOCAINE-PRILOCAINE 2.5-2.5 % EX CREA: 1.0000 "application " | TOPICAL_CREAM | CUTANEOUS | 0 refills | Status: DC | PRN

## 2017-05-29 MED ORDER — DICLOFENAC SODIUM ER 100 MG PO TB24: 100.0000 mg | ORAL_TABLET | Freq: Every day | ORAL | 0 refills | Status: DC

## 2017-05-29 NOTE — Progress Notes (Signed)
Pt requesting to have voltaren that her md from [place] wrote for pain. Obtained order from [facility]. Pt also requesting for emla cream to help with her zoladex injection in the future. Told pt that we will have it sent to her local pharmacy.

## 2017-05-29 NOTE — Patient Instructions (Signed)
Goserelin injection What is this medicine? GOSERELIN (GOE se rel in) is similar to a hormone found in the body. It lowers the amount of sex hormones that the body makes. Men will have lower testosterone levels and women will have lower estrogen levels while taking this medicine. In men, this medicine is used to treat prostate cancer; the injection is either given once per month or once every 12 weeks. A once per month injection (only) is used to treat women with endometriosis, dysfunctional uterine bleeding, or advanced breast cancer. This medicine may be used for other purposes; ask your health care provider or pharmacist if you have questions. COMMON BRAND NAME(S): Zoladex What should I tell my health care provider before I take this medicine? They need to know if you have any of these conditions (some only apply to women): -diabetes -heart disease or previous heart attack -high blood pressure -high cholesterol -kidney disease -osteoporosis or low bone density -problems passing urine -spinal cord injury -stroke -tobacco smoker -an unusual or allergic reaction to goserelin, hormone therapy, other medicines, foods, dyes, or preservatives -pregnant or trying to get pregnant -breast-feeding How should I use this medicine? This medicine is for injection under the skin. It is given by a health care professional in a hospital or clinic setting. Men receive this injection once every 4 weeks or once every 12 weeks. Women will only receive the once every 4 weeks injection. Talk to your pediatrician regarding the use of this medicine in children. Special care may be needed. Overdosage: If you think you have taken too much of this medicine contact a poison control center or emergency room at once. NOTE: This medicine is only for you. Do not share this medicine with others. What if I miss a dose? It is important not to miss your dose. Call your doctor or health care professional if you are unable to  keep an appointment. What may interact with this medicine? -female hormones like estrogen -herbal or dietary supplements like black cohosh, chasteberry, or DHEA -female hormones like testosterone -prasterone This list may not describe all possible interactions. Give your health care provider a list of all the medicines, herbs, non-prescription drugs, or dietary supplements you use. Also tell them if you smoke, drink alcohol, or use illegal drugs. Some items may interact with your medicine. What should I watch for while using this medicine? Visit your doctor or health care professional for regular checks on your progress. Your symptoms may appear to get worse during the first weeks of this therapy. Tell your doctor or healthcare professional if your symptoms do not start to get better or if they get worse after this time. Your bones may get weaker if you take this medicine for a long time. If you smoke or frequently drink alcohol you may increase your risk of bone loss. A family history of osteoporosis, chronic use of drugs for seizures (convulsions), or corticosteroids can also increase your risk of bone loss. Talk to your doctor about how to keep your bones strong. This medicine should stop regular monthly menstration in women. Tell your doctor if you continue to menstrate. Women should not become pregnant while taking this medicine or for 12 weeks after stopping this medicine. Women should inform their doctor if they wish to become pregnant or think they might be pregnant. There is a potential for serious side effects to an unborn child. Talk to your health care professional or pharmacist for more information. Do not breast-feed an infant while taking   this medicine. Men should inform their doctors if they wish to father a child. This medicine may lower sperm counts. Talk to your health care professional or pharmacist for more information. What side effects may I notice from receiving this  medicine? Side effects that you should report to your doctor or health care professional as soon as possible: -allergic reactions like skin rash, itching or hives, swelling of the face, lips, or tongue -bone pain -breathing problems -changes in vision -chest pain -feeling faint or lightheaded, falls -fever, chills -pain, swelling, warmth in the leg -pain, tingling, numbness in the hands or feet -signs and symptoms of low blood pressure like dizziness; feeling faint or lightheaded, falls; unusually weak or tired -stomach pain -swelling of the ankles, feet, hands -trouble passing urine or change in the amount of urine -unusually high or low blood pressure -unusually weak or tired Side effects that usually do not require medical attention (report to your doctor or health care professional if they continue or are bothersome): -change in sex drive or performance -changes in breast size in both males and females -changes in emotions or moods -headache -hot flashes -irritation at site where injected -loss of appetite -skin problems like acne, dry skin -vaginal dryness This list may not describe all possible side effects. Call your doctor for medical advice about side effects. You may report side effects to FDA at 1-800-FDA-1088. Where should I keep my medicine? This drug is given in a hospital or clinic and will not be stored at home. NOTE: This sheet is a summary. It may not cover all possible information. If you have questions about this medicine, talk to your doctor, pharmacist, or health care provider.  2018 Elsevier/Gold Standard (2013-12-29 11:10:35)  

## 2017-06-07 ENCOUNTER — Telehealth: Payer: Self-pay

## 2017-06-07 NOTE — Telephone Encounter (Signed)
Will follow up with pt on Monday. Thank you.

## 2017-06-07 NOTE — Telephone Encounter (Signed)
Pt called stating she had a fainting episode on Wednesday the 1st. She is taking her iron. She is asking Dr Lindi Adie to look at her labs.  She did received zoladex on 7/25.  At 1613 pt was unavailable on her phone. Labs did not indicate a reason for fainting.

## 2017-06-07 NOTE — Telephone Encounter (Signed)
Pt had a friend with her who caught her. She was on the ground she says for about 5 minutes. He picked her up and put her in the car before she woke up.  She drank some water and cooled off and felt better. She is watching her palms and making sure they stay pink. They had been white.  She is taking her iron more regularly since this. She is feeling better. Discussed labs do not indicate low iron or reason for fainting.  Instructed her if it happens again to see PCP or call us. Also call if she gets to feeling worse.

## 2017-06-10 ENCOUNTER — Telehealth: Payer: Self-pay | Admitting: Emergency Medicine

## 2017-06-10 NOTE — Telephone Encounter (Signed)
Called patient to follow up. Patient states she feels much better than last week. Pt has had no s/s of fainting spells. Instructed patient to sit down and call for help if she felt as if she were about to faint. Patient verbalized understanding of this.

## 2017-06-25 ENCOUNTER — Other Ambulatory Visit: Payer: Self-pay | Admitting: Medical Oncology

## 2017-06-25 DIAGNOSIS — C50411 Malignant neoplasm of upper-outer quadrant of right female breast: Secondary | ICD-10-CM

## 2017-06-25 DIAGNOSIS — Z170 Estrogen receptor positive status [ER+]: Secondary | ICD-10-CM

## 2017-06-26 ENCOUNTER — Other Ambulatory Visit (HOSPITAL_BASED_OUTPATIENT_CLINIC_OR_DEPARTMENT_OTHER): Payer: Medicare Other

## 2017-06-26 ENCOUNTER — Ambulatory Visit (HOSPITAL_BASED_OUTPATIENT_CLINIC_OR_DEPARTMENT_OTHER): Payer: Medicare Other

## 2017-06-26 DIAGNOSIS — C50411 Malignant neoplasm of upper-outer quadrant of right female breast: Secondary | ICD-10-CM

## 2017-06-26 DIAGNOSIS — Z5111 Encounter for antineoplastic chemotherapy: Secondary | ICD-10-CM | POA: Diagnosis present

## 2017-06-26 DIAGNOSIS — Z170 Estrogen receptor positive status [ER+]: Secondary | ICD-10-CM

## 2017-06-26 LAB — COMPREHENSIVE METABOLIC PANEL
ALT: 12 U/L (ref 0–55)
AST: 19 U/L (ref 5–34)
Albumin: 4 g/dL (ref 3.5–5.0)
Alkaline Phosphatase: 115 U/L (ref 40–150)
Anion Gap: 9 mEq/L (ref 3–11)
BUN: 10.7 mg/dL (ref 7.0–26.0)
CO2: 27 mEq/L (ref 22–29)
Calcium: 9.9 mg/dL (ref 8.4–10.4)
Chloride: 104 mEq/L (ref 98–109)
Creatinine: 1 mg/dL (ref 0.6–1.1)
EGFR: 82 mL/min/{1.73_m2} — ABNORMAL LOW (ref >90)
Glucose: 99 mg/dl (ref 70–140)
Potassium: 3.6 mEq/L (ref 3.5–5.1)
Sodium: 140 mEq/L (ref 136–145)
Total Bilirubin: 0.35 mg/dL (ref 0.20–1.20)
Total Protein: 7.9 g/dL (ref 6.4–8.3)

## 2017-06-26 LAB — CBC WITH DIFFERENTIAL/PLATELET
BASO%: 1.9 % (ref 0.0–2.0)
Basophils Absolute: 0.1 10*3/uL (ref 0.0–0.1)
EOS%: 1.8 % (ref 0.0–7.0)
Eosinophils Absolute: 0.1 10*3/uL (ref 0.0–0.5)
HCT: 29.5 % — ABNORMAL LOW (ref 34.8–46.6)
HGB: 9.5 g/dL — ABNORMAL LOW (ref 11.6–15.9)
LYMPH%: 34.3 % (ref 14.0–49.7)
MCH: 27 pg (ref 25.1–34.0)
MCHC: 32.3 g/dL (ref 31.5–36.0)
MCV: 83.8 fL (ref 79.5–101.0)
MONO#: 0.3 10*3/uL (ref 0.1–0.9)
MONO%: 6.5 % (ref 0.0–14.0)
NEUT#: 2.9 10*3/uL (ref 1.5–6.5)
NEUT%: 55.5 % (ref 38.4–76.8)
Platelets: 450 10*3/uL — ABNORMAL HIGH (ref 145–400)
RBC: 3.53 10*6/uL — ABNORMAL LOW (ref 3.70–5.45)
RDW: 16.6 % — ABNORMAL HIGH (ref 11.2–14.5)
WBC: 5.3 10*3/uL (ref 3.9–10.3)
lymph#: 1.8 10*3/uL (ref 0.9–3.3)

## 2017-06-26 MED ORDER — GOSERELIN ACETATE 3.6 MG ~~LOC~~ IMPL
3.6000 mg | DRUG_IMPLANT | Freq: Once | SUBCUTANEOUS | Status: AC
  Administered 2017-06-26: 3.6 mg via SUBCUTANEOUS
  Filled 2017-06-26: qty 3.6

## 2017-06-26 NOTE — Patient Instructions (Signed)
Goserelin injection What is this medicine? GOSERELIN (GOE se rel in) is similar to a hormone found in the body. It lowers the amount of sex hormones that the body makes. Men will have lower testosterone levels and women will have lower estrogen levels while taking this medicine. In men, this medicine is used to treat prostate cancer; the injection is either given once per month or once every 12 weeks. A once per month injection (only) is used to treat women with endometriosis, dysfunctional uterine bleeding, or advanced breast cancer. This medicine may be used for other purposes; ask your health care provider or pharmacist if you have questions. COMMON BRAND NAME(S): Zoladex What should I tell my health care provider before I take this medicine? They need to know if you have any of these conditions (some only apply to women): -diabetes -heart disease or previous heart attack -high blood pressure -high cholesterol -kidney disease -osteoporosis or low bone density -problems passing urine -spinal cord injury -stroke -tobacco smoker -an unusual or allergic reaction to goserelin, hormone therapy, other medicines, foods, dyes, or preservatives -pregnant or trying to get pregnant -breast-feeding How should I use this medicine? This medicine is for injection under the skin. It is given by a health care professional in a hospital or clinic setting. Men receive this injection once every 4 weeks or once every 12 weeks. Women will only receive the once every 4 weeks injection. Talk to your pediatrician regarding the use of this medicine in children. Special care may be needed. Overdosage: If you think you have taken too much of this medicine contact a poison control center or emergency room at once. NOTE: This medicine is only for you. Do not share this medicine with others. What if I miss a dose? It is important not to miss your dose. Call your doctor or health care professional if you are unable to  keep an appointment. What may interact with this medicine? -female hormones like estrogen -herbal or dietary supplements like black cohosh, chasteberry, or DHEA -female hormones like testosterone -prasterone This list may not describe all possible interactions. Give your health care provider a list of all the medicines, herbs, non-prescription drugs, or dietary supplements you use. Also tell them if you smoke, drink alcohol, or use illegal drugs. Some items may interact with your medicine. What should I watch for while using this medicine? Visit your doctor or health care professional for regular checks on your progress. Your symptoms may appear to get worse during the first weeks of this therapy. Tell your doctor or healthcare professional if your symptoms do not start to get better or if they get worse after this time. Your bones may get weaker if you take this medicine for a long time. If you smoke or frequently drink alcohol you may increase your risk of bone loss. A family history of osteoporosis, chronic use of drugs for seizures (convulsions), or corticosteroids can also increase your risk of bone loss. Talk to your doctor about how to keep your bones strong. This medicine should stop regular monthly menstration in women. Tell your doctor if you continue to menstrate. Women should not become pregnant while taking this medicine or for 12 weeks after stopping this medicine. Women should inform their doctor if they wish to become pregnant or think they might be pregnant. There is a potential for serious side effects to an unborn child. Talk to your health care professional or pharmacist for more information. Do not breast-feed an infant while taking   this medicine. Men should inform their doctors if they wish to father a child. This medicine may lower sperm counts. Talk to your health care professional or pharmacist for more information. What side effects may I notice from receiving this  medicine? Side effects that you should report to your doctor or health care professional as soon as possible: -allergic reactions like skin rash, itching or hives, swelling of the face, lips, or tongue -bone pain -breathing problems -changes in vision -chest pain -feeling faint or lightheaded, falls -fever, chills -pain, swelling, warmth in the leg -pain, tingling, numbness in the hands or feet -signs and symptoms of low blood pressure like dizziness; feeling faint or lightheaded, falls; unusually weak or tired -stomach pain -swelling of the ankles, feet, hands -trouble passing urine or change in the amount of urine -unusually high or low blood pressure -unusually weak or tired Side effects that usually do not require medical attention (report to your doctor or health care professional if they continue or are bothersome): -change in sex drive or performance -changes in breast size in both males and females -changes in emotions or moods -headache -hot flashes -irritation at site where injected -loss of appetite -skin problems like acne, dry skin -vaginal dryness This list may not describe all possible side effects. Call your doctor for medical advice about side effects. You may report side effects to FDA at 1-800-FDA-1088. Where should I keep my medicine? This drug is given in a hospital or clinic and will not be stored at home. NOTE: This sheet is a summary. It may not cover all possible information. If you have questions about this medicine, talk to your doctor, pharmacist, or health care provider.  2018 Elsevier/Gold Standard (2013-12-29 11:10:35)  

## 2017-07-23 ENCOUNTER — Other Ambulatory Visit: Payer: Self-pay

## 2017-07-23 DIAGNOSIS — Z170 Estrogen receptor positive status [ER+]: Secondary | ICD-10-CM

## 2017-07-23 DIAGNOSIS — C50411 Malignant neoplasm of upper-outer quadrant of right female breast: Secondary | ICD-10-CM

## 2017-07-24 ENCOUNTER — Ambulatory Visit (HOSPITAL_BASED_OUTPATIENT_CLINIC_OR_DEPARTMENT_OTHER): Payer: Medicare Other

## 2017-07-24 ENCOUNTER — Other Ambulatory Visit (HOSPITAL_BASED_OUTPATIENT_CLINIC_OR_DEPARTMENT_OTHER): Payer: Medicare Other

## 2017-07-24 VITALS — BP 179/102 | HR 92 | Temp 98.2°F | Resp 18

## 2017-07-24 DIAGNOSIS — Z5111 Encounter for antineoplastic chemotherapy: Secondary | ICD-10-CM

## 2017-07-24 DIAGNOSIS — C50411 Malignant neoplasm of upper-outer quadrant of right female breast: Secondary | ICD-10-CM

## 2017-07-24 DIAGNOSIS — Z170 Estrogen receptor positive status [ER+]: Secondary | ICD-10-CM

## 2017-07-24 LAB — CBC WITH DIFFERENTIAL/PLATELET
BASO%: 1 % (ref 0.0–2.0)
Basophils Absolute: 0.1 10*3/uL (ref 0.0–0.1)
EOS%: 2.5 % (ref 0.0–7.0)
Eosinophils Absolute: 0.1 10*3/uL (ref 0.0–0.5)
HCT: 36.8 % (ref 34.8–46.6)
HGB: 11.7 g/dL (ref 11.6–15.9)
LYMPH%: 29.4 % (ref 14.0–49.7)
MCH: 26.2 pg (ref 25.1–34.0)
MCHC: 31.9 g/dL (ref 31.5–36.0)
MCV: 82.2 fL (ref 79.5–101.0)
MONO#: 0.4 10*3/uL (ref 0.1–0.9)
MONO%: 6.6 % (ref 0.0–14.0)
NEUT#: 3.5 10*3/uL (ref 1.5–6.5)
NEUT%: 60.5 % (ref 38.4–76.8)
Platelets: 340 10*3/uL (ref 145–400)
RBC: 4.48 10*6/uL (ref 3.70–5.45)
RDW: 20.7 % — ABNORMAL HIGH (ref 11.2–14.5)
WBC: 5.9 10*3/uL (ref 3.9–10.3)
lymph#: 1.7 10*3/uL (ref 0.9–3.3)

## 2017-07-24 LAB — COMPREHENSIVE METABOLIC PANEL
ALT: 16 U/L (ref 0–55)
AST: 18 U/L (ref 5–34)
Albumin: 4.3 g/dL (ref 3.5–5.0)
Alkaline Phosphatase: 110 U/L (ref 40–150)
Anion Gap: 10 mEq/L (ref 3–11)
BUN: 9.4 mg/dL (ref 7.0–26.0)
CO2: 31 mEq/L — ABNORMAL HIGH (ref 22–29)
Calcium: 10 mg/dL (ref 8.4–10.4)
Chloride: 105 mEq/L (ref 98–109)
Creatinine: 0.9 mg/dL (ref 0.6–1.1)
EGFR: 83 mL/min/{1.73_m2} — ABNORMAL LOW (ref >90)
Glucose: 87 mg/dl (ref 70–140)
Potassium: 3.2 mEq/L — ABNORMAL LOW (ref 3.5–5.1)
Sodium: 146 mEq/L — ABNORMAL HIGH (ref 136–145)
Total Bilirubin: 0.38 mg/dL (ref 0.20–1.20)
Total Protein: 8.1 g/dL (ref 6.4–8.3)

## 2017-07-24 MED ORDER — GOSERELIN ACETATE 3.6 MG ~~LOC~~ IMPL
3.6000 mg | DRUG_IMPLANT | Freq: Once | SUBCUTANEOUS | Status: DC
  Filled 2017-07-24: qty 3.6

## 2017-08-15 ENCOUNTER — Telehealth: Payer: Self-pay | Admitting: *Deleted

## 2017-08-15 NOTE — Telephone Encounter (Signed)
"  I need a refill for exemestane/aromasin 1 mg faxed to [facility] [phone].  I have two pills left.  Please fax order so they can begin to process the order. I can't afford my medications, [facility] can help so send a 90-day supply with three refills."

## 2017-08-16 ENCOUNTER — Other Ambulatory Visit: Payer: Self-pay | Admitting: *Deleted

## 2017-08-16 ENCOUNTER — Telehealth: Payer: Self-pay | Admitting: Hematology and Oncology

## 2017-08-16 MED ORDER — EXEMESTANE 25 MG PO TABS: 25.0000 mg | ORAL_TABLET | Freq: Every day | ORAL | 3 refills | Status: DC

## 2017-08-16 NOTE — Telephone Encounter (Signed)
Scheduled appt per 10/12 sch message - patient is aware of appt date and time.

## 2017-08-29 ENCOUNTER — Telehealth: Payer: Self-pay | Admitting: Hematology and Oncology

## 2017-08-29 NOTE — Telephone Encounter (Signed)
Spoke with patient re 11/1 appointments. °

## 2017-09-04 ENCOUNTER — Telehealth: Payer: Self-pay | Admitting: *Deleted

## 2017-09-04 NOTE — Telephone Encounter (Signed)
Message from pt requesting referral to a doctor for "nerve damage" in R hand. Returned call, pt reports numbness and tingling in middle finger and thumb of R hand. She wants to have it evaluated. Informed her Dr. Lindi Adie will discuss at visit tomorrow, he is out of the office today. Pt voiced understanding.

## 2017-09-05 ENCOUNTER — Other Ambulatory Visit (HOSPITAL_BASED_OUTPATIENT_CLINIC_OR_DEPARTMENT_OTHER): Payer: Medicare Other

## 2017-09-05 ENCOUNTER — Ambulatory Visit (HOSPITAL_BASED_OUTPATIENT_CLINIC_OR_DEPARTMENT_OTHER): Payer: Medicare Other

## 2017-09-05 ENCOUNTER — Encounter: Payer: Self-pay | Admitting: Adult Health

## 2017-09-05 ENCOUNTER — Telehealth: Payer: Self-pay | Admitting: Hematology and Oncology

## 2017-09-05 ENCOUNTER — Encounter: Payer: Self-pay | Admitting: Pharmacist

## 2017-09-05 ENCOUNTER — Ambulatory Visit (HOSPITAL_BASED_OUTPATIENT_CLINIC_OR_DEPARTMENT_OTHER): Payer: Medicare Other | Admitting: Adult Health

## 2017-09-05 VITALS — BP 156/87 | HR 110 | Temp 98.5°F | Resp 18 | Ht 65.0 in | Wt 133.0 lb

## 2017-09-05 DIAGNOSIS — Z5111 Encounter for antineoplastic chemotherapy: Secondary | ICD-10-CM

## 2017-09-05 DIAGNOSIS — C50411 Malignant neoplasm of upper-outer quadrant of right female breast: Secondary | ICD-10-CM

## 2017-09-05 DIAGNOSIS — R002 Palpitations: Secondary | ICD-10-CM

## 2017-09-05 DIAGNOSIS — R000 Tachycardia, unspecified: Secondary | ICD-10-CM | POA: Diagnosis not present

## 2017-09-05 DIAGNOSIS — Z170 Estrogen receptor positive status [ER+]: Secondary | ICD-10-CM

## 2017-09-05 DIAGNOSIS — I10 Essential (primary) hypertension: Secondary | ICD-10-CM | POA: Diagnosis not present

## 2017-09-05 LAB — COMPREHENSIVE METABOLIC PANEL
ALT: 28 U/L (ref 0–55)
AST: 23 U/L (ref 5–34)
Albumin: 3.9 g/dL (ref 3.5–5.0)
Alkaline Phosphatase: 126 U/L (ref 40–150)
Anion Gap: 8 mEq/L (ref 3–11)
BUN: 12.8 mg/dL (ref 7.0–26.0)
CO2: 30 mEq/L — ABNORMAL HIGH (ref 22–29)
Calcium: 9.8 mg/dL (ref 8.4–10.4)
Chloride: 104 mEq/L (ref 98–109)
Creatinine: 0.8 mg/dL (ref 0.6–1.1)
EGFR: >60 mL/min/{1.73_m2} (ref >60)
Glucose: 73 mg/dl (ref 70–140)
Potassium: 3.6 mEq/L (ref 3.5–5.1)
Sodium: 142 mEq/L (ref 136–145)
Total Bilirubin: 0.41 mg/dL (ref 0.20–1.20)
Total Protein: 7.3 g/dL (ref 6.4–8.3)

## 2017-09-05 LAB — CBC WITH DIFFERENTIAL/PLATELET
BASO%: 0.2 % (ref 0.0–2.0)
Basophils Absolute: 0 10*3/uL (ref 0.0–0.1)
EOS%: 3 % (ref 0.0–7.0)
Eosinophils Absolute: 0.2 10*3/uL (ref 0.0–0.5)
HCT: 40.1 % (ref 34.8–46.6)
HGB: 13 g/dL (ref 11.6–15.9)
LYMPH%: 41.2 % (ref 14.0–49.7)
MCH: 26.9 pg (ref 25.1–34.0)
MCHC: 32.4 g/dL (ref 31.5–36.0)
MCV: 82.9 fL (ref 79.5–101.0)
MONO#: 0.4 10*3/uL (ref 0.1–0.9)
MONO%: 8.5 % (ref 0.0–14.0)
NEUT#: 2.4 10*3/uL (ref 1.5–6.5)
NEUT%: 47.1 % (ref 38.4–76.8)
Platelets: 223 10*3/uL (ref 145–400)
RBC: 4.84 10*6/uL (ref 3.70–5.45)
RDW: 19.6 % — ABNORMAL HIGH (ref 11.2–14.5)
WBC: 5.1 10*3/uL (ref 3.9–10.3)
lymph#: 2.1 10*3/uL (ref 0.9–3.3)

## 2017-09-05 MED ORDER — GOSERELIN ACETATE 3.6 MG ~~LOC~~ IMPL
3.6000 mg | DRUG_IMPLANT | Freq: Once | SUBCUTANEOUS | Status: AC
  Administered 2017-09-05: 3.6 mg via SUBCUTANEOUS
  Filled 2017-09-05: qty 3.6

## 2017-09-05 MED ORDER — LETROZOLE 2.5 MG PO TABS: 2.5000 mg | ORAL_TABLET | Freq: Every day | ORAL | 0 refills | Status: DC

## 2017-09-05 NOTE — Progress Notes (Addendum)
[facility] Follow up:    Patient, [name] No address on file   DIAGNOSIS: Cancer Staging Breast cancer of upper-outer quadrant of right female breast ([place]) Staging form: Breast, AJCC 7th Edition - Clinical: Stage IIIB (T4b, N0, M0) - Unsigned   SUMMARY OF ONCOLOGIC HISTORY:   Breast cancer of upper-outer quadrant of right female breast ([place])   09/21/2010 Mammogram    right breast linear and segmental pleomorphic calcifications from 4:00 to 6:00 position ultrasound revealed 1.6 cm and 1.1 cm masses      09/27/2010 Initial Biopsy    ultrasound-guided biopsy of all masses showed DCIS grade 2; patient went to cancer treatment centers of [place] for second opinion and delayed therapy      01/26/2011 Surgery    right mastectomy followed by reconstruction: Invasive ductal carcinoma T1 N1 MIC M0 stage IB ER/PR positive HER-2 negative, BRCA negative, Oncotype DX low risk      05/29/2012 Procedure    right chest wall nodule excision done on 06/24/2012 showed metastatic carcinoma margins were positive, but CT scan no metastatic disease, recommended chemotherapy but patient refused also refused reexcision      04/28/2013 Treatment Plan Change    patient went to [place] for alternative treatments and took herbal medications, tonics etc. But she could not afford these trips.      01/08/2014 Breast MRI    right breast multiple enhancing masses within the soft tissues largest 5.6 cm in wall skin surface and the capsule of the silicone prosthesis, contiguous nodules involving in inferomedial breast and tired and subcentimeter nodules across the midline      08/29/2015 Surgery    Right mastectomy: IDC with invol of skin and skin ulceration, breast capsule inv cancer, superior medial margin positive, 1/1 LN positive, grade 3, 14.3 cm, 3.5 cm, ALI, chest wall involv, ER 90-100%, PR 80-90%, HER-2 negative, Ki 67 40-50% T4CN1 (St 3B)      03/05/2016 -  Anti-estrogen oral  therapy    Tamoxifen 20 mg daily stopped due to headache and uncontrolled hypertension. Decrease to 10 mg daily 04/03/2016, stopped June 2017 and took an estrogen metabolizer over-the-counter; started Aromasin April 2018 from a [place] physician, Zoladex started on 05/2017 and switched to Letrozole in 09/2017       CURRENT THERAPY: Zoladex and Aromasin  INTERVAL HISTORY: Lilo [place] 46 y.o. female returns for evaluation of her breast cancer.  She is taking Aromasin daily and receives Zoladex monthly.  Right now she is out of aromasin due to cost.  She also has some joint pain in her right shoulder and numbness in her fingers in her right hand, mainly the first three digits.  She cannot recall any recent precipitating repetitive motions that may have triggered it.  She does have palpitations and is slightly tachycardic today.    Patient Active Problem List   Diagnosis Date Noted  . Breast cancer of upper-outer quadrant of right female breast ([place]) 11/02/2014    has No Known Allergies.  MEDICAL HISTORY: Past Medical History:  Diagnosis Date  . Breast cancer ([place])   . Hypertension     SURGICAL HISTORY: Past Surgical History:  Procedure Laterality Date  . ABDOMINAL HYSTERECTOMY    . MASTECTOMY    . salivary gland stone      SOCIAL HISTORY: Social History   Social History  . Marital status: Divorced    Spouse name: N/A  . Number of children: N/A  . Years of  education: N/A   Occupational History  . Not on file.   Social History Main Topics  . Smoking status: Never Smoker  . Smokeless tobacco: Never Used  . Alcohol use No  . Drug use: No  . Sexual activity: Yes   Other Topics Concern  . Not on file   Social History Narrative  . No narrative on file    FAMILY HISTORY: Family History  Problem Relation Age of Onset  . Cancer Maternal Aunt   . Cancer Maternal Grandfather   . Cancer Paternal Grandfather     Review of Systems  Constitutional: Negative for  appetite change, chills, fatigue and fever.  HENT:   Negative for hearing loss and lump/mass.   Eyes: Negative for eye problems and icterus.  Respiratory: Negative for chest tightness, cough and shortness of breath.   Cardiovascular: Positive for palpitations. Negative for chest pain and leg swelling.  Gastrointestinal: Negative for abdominal distention, abdominal pain, constipation, diarrhea, nausea and vomiting.  Endocrine: Negative for hot flashes.  Genitourinary: Negative for difficulty urinating.   Musculoskeletal: Negative for arthralgias.  Skin: Negative for itching and rash.  Neurological: Negative for dizziness, extremity weakness, headaches and numbness.  Hematological: Negative for adenopathy. Does not bruise/bleed easily.  Psychiatric/Behavioral: Negative for depression. The patient is not nervous/anxious.       PHYSICAL EXAMINATION  ECOG PERFORMANCE STATUS: 1 - Symptomatic but completely ambulatory  Vitals:   09/05/17 1443  BP: (!) 156/87  Pulse: (!) 110  Resp: 18  Temp: 98.5 F (36.9 C)  SpO2: 100%    Physical Exam  Constitutional: She is oriented to person, place, and time and well-developed, well-nourished, and in no distress.  HENT:  Head: Normocephalic and atraumatic.  Mouth/Throat: Oropharynx is clear and moist. No oropharyngeal exudate.  Eyes: Pupils are equal, round, and reactive to light.  Neck: Neck supple.  Cardiovascular: Regular rhythm and normal heart sounds.   Tachycardic   Pulmonary/Chest: Effort normal and breath sounds normal. No respiratory distress. She has no wheezes. She has no rales.  Abdominal: Soft. Bowel sounds are normal.  Musculoskeletal: She exhibits no edema.  Lymphadenopathy:    She has no cervical adenopathy.  Neurological: She is alert and oriented to person, place, and time.  Skin: Skin is warm and dry. No rash noted.  Psychiatric: Mood and affect normal.    LABORATORY DATA:  CBC    Component Value Date/Time   WBC  5.1 09/05/2017 1421   RBC 4.84 09/05/2017 1421   HGB 13.0 09/05/2017 1421   HCT 40.1 09/05/2017 1421   PLT 223 09/05/2017 1421   MCV 82.9 09/05/2017 1421   MCH 26.9 09/05/2017 1421   MCHC 32.4 09/05/2017 1421   RDW 19.6 (H) 09/05/2017 1421   LYMPHSABS 2.1 09/05/2017 1421   MONOABS 0.4 09/05/2017 1421   EOSABS 0.2 09/05/2017 1421   BASOSABS 0.0 09/05/2017 1421    CMP     Component Value Date/Time   NA 142 09/05/2017 1421   K 3.6 09/05/2017 1421   CO2 30 (H) 09/05/2017 1421   GLUCOSE 73 09/05/2017 1421   BUN 12.8 09/05/2017 1421   CREATININE 0.8 09/05/2017 1421   CALCIUM 9.8 09/05/2017 1421   PROT 7.3 09/05/2017 1421   ALBUMIN 3.9 09/05/2017 1421   AST 23 09/05/2017 1421   ALT 28 09/05/2017 1421   ALKPHOS 126 09/05/2017 1421   BILITOT 0.41 09/05/2017 1421       ASSESSMENT and PLAN:   Breast cancer of upper-outer  quadrant of right female breast ([place]) Recurrent right breast cancer initially DCIS treated with mastectomy followed by reconstruction and later developed chest wall recurrence in 2013 ER/PR positive HER-2 negative, patient underwent alternative therapies in [place] but could not afford the trips and hence she has not been on any breast cancer therapy for a long time.  PET/CT scan 11/12/2014 did not show metastatic disease but showed extensive involvement in the right breast in an around the implant Stage 3B  Right mastectomy 08/29/2015 at [facility] (Dr.Singh): IDC with invol of skin and skin ulceration, breast capsule inv cancer, superior medial margin positive, 1/1 LN positive, grade 3, 14.3 cm, 3.5 cm, ALI, chest wall involv, ER 90-100%, PR 80-90%, HER-2 negative, Ki 67 40-50% T4CN1 (St 3B)  Treatment plan: (Patient is premenopausal based on blood work done May 2017, estradiol 250, FSH 5.4) 1. Patient started tamoxifen but developed severe headache with severe hypertension and tamoxifen was discontinued 03/17/2016.   -02/2017 started on Exemestane, Zoladex started  05/29/2017, she does have some joint aches and pains (likely the shoulder issues mentioned today)  Due to affordability issues she will switch to Letrozole daily and will receive samples today from our oral pharmacist Johny Drilling, PharmD.   Patient is currently on disability.  2. Severe hypertension and tachycardia: referral placed to cardiology.   Return to clinic monthly for Zoladex and in 3 months for follow up.       Orders Placed This Encounter  Procedures  . Ambulatory referral to Cardiology    Referral Priority:   Routine    Referral Type:   Consultation    Referral Reason:   Specialty Services Required    Requested Specialty:   Cardiology    Number of Visits Requested:   1    All questions were answered. The patient knows to call the clinic with any problems, questions or concerns. We can certainly see the patient much sooner if necessary. This note was electronically signed. Scot Dock, NP 09/05/2017   Attending Note  I personally saw and examined [name]. The plan of care was discussed with her. I agree with the assessment and plan as documented above. I discussed the plan in great detail with the patient She is doing much better on the combination of Zoladex with anastrozole therapy. Tachycardia: Cardiology referral made. Return to clinic in 3 months and monthly for Zoladex Signed Rulon Eisenmenger, MD

## 2017-09-05 NOTE — Progress Notes (Signed)
Oral Chemotherapy Pharmacist Encounter  Notified by MD that patient cannot afford exemestane prescription any longer. Verified lack of prescription coverage with patient. Will switch to Femera (letrozole) as we have physician samples and patient was starting to experience arthralgias and myalgias from exemestane use.  Dispensed samples to patient:  Medication: Femara (letrozole) 2.5mg  tablets Instructions: take 1 tablet by mouth once daily without regard to food Quantity dispensed: 60 Days supply: 60 Manufacturer: Novartis Lot: [id number] Exp: 09/19  Johny Drilling, PharmD, BCPS, BCOP 09/05/2017 4:33 PM Oral Oncology Clinic [phone]

## 2017-09-05 NOTE — Telephone Encounter (Signed)
Gave patient avs report and appointments for November thru January  °

## 2017-09-05 NOTE — Assessment & Plan Note (Addendum)
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  PET/CT scan 11/12/2014 did not show metastatic disease but showed extensive involvement in the right breast in an around the implant Stage 3B  Right mastectomy 08/29/2015 at [facility] (Dr.Singh): IDC with invol of skin and skin ulceration, breast capsule inv cancer, superior medial margin positive, 1/1 LN positive, grade 3, 14.3 cm, 3.5 cm, ALI, chest wall involv, ER 90-100%, PR 80-90%, HER-2 negative, Ki 67 40-50% T4CN1 (St 3B)  Treatment plan: (Patient is premenopausal based on blood work done May 2017, estradiol 250, FSH 5.4) 1. Patient started tamoxifen but developed severe headache with severe hypertension and tamoxifen was discontinued 03/17/2016.   -02/2017 started on Exemestane, Zoladex started 05/29/2017, she does have some joint aches and pains (likely the shoulder issues mentioned today)  Due to affordability issues she will switch to Letrozole daily and will receive samples today from our oral pharmacist Jesse Mack, PharmD.   Patient is currently on disability.  2. Severe hypertension and tachycardia: referral placed to cardiology.   Return to clinic monthly for Zoladex and in 3 months for follow up.     

## 2017-09-13 ENCOUNTER — Telehealth: Payer: Self-pay

## 2017-09-13 NOTE — Telephone Encounter (Signed)
Left VM for patient informing her of her appointment scheduled with cardiology. Date and time provided as well as address and phone number.  Cyndia Bent RN

## 2017-09-20 ENCOUNTER — Telehealth: Payer: Self-pay | Admitting: Hematology and Oncology

## 2017-09-20 NOTE — Telephone Encounter (Signed)
Spoke with patient regarding appts being moved to 11/30 per 11/12 sch msg.

## 2017-10-03 ENCOUNTER — Inpatient Hospital Stay: Payer: Medicare Other

## 2017-10-03 ENCOUNTER — Other Ambulatory Visit: Payer: Self-pay

## 2017-10-03 ENCOUNTER — Other Ambulatory Visit: Payer: Self-pay | Admitting: Hematology and Oncology

## 2017-10-04 ENCOUNTER — Other Ambulatory Visit (HOSPITAL_BASED_OUTPATIENT_CLINIC_OR_DEPARTMENT_OTHER): Payer: Medicare Other

## 2017-10-04 ENCOUNTER — Ambulatory Visit (INDEPENDENT_AMBULATORY_CARE_PROVIDER_SITE_OTHER): Payer: Medicare Other | Admitting: Cardiovascular Disease

## 2017-10-04 ENCOUNTER — Encounter: Payer: Self-pay | Admitting: Cardiovascular Disease

## 2017-10-04 ENCOUNTER — Ambulatory Visit (HOSPITAL_BASED_OUTPATIENT_CLINIC_OR_DEPARTMENT_OTHER): Payer: Medicare Other

## 2017-10-04 VITALS — BP 155/99 | HR 97 | Temp 98.3°F | Resp 18

## 2017-10-04 VITALS — BP 146/90 | HR 100 | Ht 65.0 in | Wt 132.8 lb

## 2017-10-04 DIAGNOSIS — C50411 Malignant neoplasm of upper-outer quadrant of right female breast: Secondary | ICD-10-CM

## 2017-10-04 DIAGNOSIS — I10 Essential (primary) hypertension: Secondary | ICD-10-CM | POA: Diagnosis not present

## 2017-10-04 DIAGNOSIS — R000 Tachycardia, unspecified: Secondary | ICD-10-CM

## 2017-10-04 DIAGNOSIS — Z170 Estrogen receptor positive status [ER+]: Secondary | ICD-10-CM

## 2017-10-04 DIAGNOSIS — R55 Syncope and collapse: Secondary | ICD-10-CM | POA: Diagnosis not present

## 2017-10-04 DIAGNOSIS — Z5111 Encounter for antineoplastic chemotherapy: Secondary | ICD-10-CM | POA: Diagnosis present

## 2017-10-04 LAB — CBC WITH DIFFERENTIAL/PLATELET
BASO%: 0.4 % (ref 0.0–2.0)
Basophils Absolute: 0 10*3/uL (ref 0.0–0.1)
EOS%: 2.7 % (ref 0.0–7.0)
Eosinophils Absolute: 0.2 10*3/uL (ref 0.0–0.5)
HCT: 40 % (ref 34.8–46.6)
HGB: 13.1 g/dL (ref 11.6–15.9)
LYMPH%: 41.8 % (ref 14.0–49.7)
MCH: 27.9 pg (ref 25.1–34.0)
MCHC: 32.8 g/dL (ref 31.5–36.0)
MCV: 85.3 fL (ref 79.5–101.0)
MONO#: 0.4 10*3/uL (ref 0.1–0.9)
MONO%: 6.7 % (ref 0.0–14.0)
NEUT#: 2.7 10*3/uL (ref 1.5–6.5)
NEUT%: 48.4 % (ref 38.4–76.8)
Platelets: 248 10*3/uL (ref 145–400)
RBC: 4.69 10*6/uL (ref 3.70–5.45)
RDW: 18.4 % — ABNORMAL HIGH (ref 11.2–14.5)
WBC: 5.6 10*3/uL (ref 3.9–10.3)
lymph#: 2.3 10*3/uL (ref 0.9–3.3)

## 2017-10-04 LAB — COMPREHENSIVE METABOLIC PANEL
ALT: 17 U/L (ref 0–55)
AST: 16 U/L (ref 5–34)
Albumin: 4 g/dL (ref 3.5–5.0)
Alkaline Phosphatase: 137 U/L (ref 40–150)
Anion Gap: 9 mEq/L (ref 3–11)
BUN: 16.4 mg/dL (ref 7.0–26.0)
CO2: 28 mEq/L (ref 22–29)
Calcium: 9.5 mg/dL (ref 8.4–10.4)
Chloride: 108 mEq/L (ref 98–109)
Creatinine: 0.9 mg/dL (ref 0.6–1.1)
EGFR: >60 mL/min/{1.73_m2} (ref >60)
Glucose: 96 mg/dl (ref 70–140)
Potassium: 3.6 mEq/L (ref 3.5–5.1)
Sodium: 144 mEq/L (ref 136–145)
Total Bilirubin: 0.31 mg/dL (ref 0.20–1.20)
Total Protein: 7.5 g/dL (ref 6.4–8.3)

## 2017-10-04 MED ORDER — CARVEDILOL 12.5 MG PO TABS: 12.5000 mg | ORAL_TABLET | Freq: Two times a day (BID) | ORAL | 5 refills | Status: DC

## 2017-10-04 MED ORDER — GOSERELIN ACETATE 3.6 MG ~~LOC~~ IMPL
3.6000 mg | DRUG_IMPLANT | Freq: Once | SUBCUTANEOUS | Status: AC
  Administered 2017-10-04: 3.6 mg via SUBCUTANEOUS
  Filled 2017-10-04: qty 3.6

## 2017-10-04 NOTE — Progress Notes (Signed)
Cardiology Office Note   Date:  10/05/2017   ID:  [name], DOB 03-Oct-1971, MRN [id number]  PCP:  Nicholas Lose, MD  Cardiologist:   Skeet Latch, MD   No chief complaint on file.     History of Present Illness: Misty Arnold is a 46 y.o. female with hypertension and recurrent breast cancer status post mastectomy who is being seen today for the evaluation of hypertension and tachycardia at the request of Wilber Bihari Cornett*.  Misty Arnold developed recurrent breast cancer in 2013 and underwent alternative therapies in [place] due to inability to afford traditional treatments and a desire for wholistic care.  She had natural treatment with herbs and tea and eliminated sugar and dairy from her diet.  PET scan 11/2014 did not show any metastatic disease but she did have involvement of the right breast.  She had a right mastectomy 08/2015 and was started on tamoxifen.  She did not tolerate this and was switched to Exemestane/ Zoladex.  This was then changed to Letrozole.  She last saw Wilber Bihari, NP, on 09/05/17 and was noted to be hypertensive and tachycardic.  She was referred to cardiology for further evaluation.   Misty Arnold reports one episode of near syncope and two episodes of syncope.  The near syncope occurred after returing from [place].  She was in the emergency department with her mother when she felt her heart racing.  Her whole body was shaking.  At the time her weight was down and she was not eating well.  She also was not sleeping well.  She started hyperventilating and felt that she could not breathe.  She thinks this was a panic attack.  She got lightheaded but never passed out.  In May 2018 she had an episode of syncope that occurred after getting out of the bed in the middle the night.  She walked to the bathroom and after using the restroom on her way back to the bed she passed out.  Prior to passing out she felt her heart racing was very nauseous, and broke out  into a sweat.  When she hit the  floor she woke up.  She broke her nose and hit her forehead.  At the time she was not eating or drinking well.  In September 2018 she had a recurrent episode.  This occurred when working outside in hot weather.  She has not experienced any recent episodes.  She denies any chest pain and her breathing has been stable.  Since starting therapy she has been able to gain some weight.  Last week she was 95 pounds and is now back up to 132 pounds.  Misty Arnold reports that she is very stressed and anxious.  She has been dealing with breast cancer since 2011.  She feels her heart racing when she is anxious but not with exertion.  T during these episodes she feels like she cannot take a deep breath.  He episodes last until she can come down.  She does not drink any caffeine or use any cold or cough medications.  She reports drinking a lot of water but her food intake is sometimes still poor.  Her sleeping has improved recently.  She denies any chronic pain.  She walks for exercising 3-4 days/week for approximately 30 minutes.  She denies any lower extremity edema, orthopnea, or PND.  At times it has been up to 170s over 90s.  She has been taking amlodipine since she was in her  early 70s.   Past Medical History:  Diagnosis Date  . Breast cancer ([place])   . Hypertension     Past Surgical History:  Procedure Laterality Date  . ABDOMINAL HYSTERECTOMY    . MASTECTOMY    . salivary gland stone       Current Outpatient Medications  Medication Sig Dispense Refill  . amLODipine (NORVASC) 10 MG tablet Take by mouth.    Marland Kitchen Bioflavonoid Products (ESTER C PO) Take by mouth.    Marland Kitchen BLACK CURRANT SEED OIL PO Take by mouth.    . Cholecalciferol (VITAMIN D PO) Take 5,000 Units by mouth 2 (two) times daily.     . Diclofenac Sodium CR (VOLTAREN-XR) 100 MG 24 hr tablet Take 1 tablet (100 mg total) by mouth daily. 30 tablet 0  . Diindolylmethane POWD Take 75 mg by mouth daily.    Marland Kitchen  escitalopram (LEXAPRO) 10 MG tablet     . Fish Oil OIL by Does not apply route.    . Flaxseed Oil OIL by Does not apply route.    Marland Kitchen letrozole (FEMARA) 2.5 MG tablet Take 1 tablet (2.5 mg total) by mouth daily. 60 tablet 0  . lidocaine-prilocaine (EMLA) cream Apply 1 application topically as needed. Apply 1 hr prior to zoladex injection. 30 g 0  . Misc Natural Products (DANDELION ROOT PO) Take by mouth.    . [place], Absinthium, Oil OIL by Does not apply route.    . carvedilol (COREG) 12.5 MG tablet Take 1 tablet (12.5 mg total) by mouth 2 (two) times daily. 60 tablet 5   No current facility-administered medications for this visit.   132 pounds.  Allergies:   Penicillins    Social History:  The patient  reports that  has never smoked. she has never used smokeless tobacco. She reports that she does not drink alcohol or use drugs.   Family History:  The patient's family history includes Cancer in her maternal aunt, maternal grandfather, and paternal grandfather; Colon cancer in her maternal grandfather and paternal grandfather; Diabetes in her father, mother, and paternal grandmother; Heart attack in her maternal aunt, mother, and paternal grandmother; Heart failure in her paternal grandmother; Hyperlipidemia in her mother and paternal grandmother; Stroke in her father, maternal aunt, maternal grandfather, maternal grandmother, mother, and paternal grandmother.    ROS:  Please see the history of present illness.   Otherwise, review of systems are positive for right first through third digit numbness and tingling..   All other systems are reviewed and negative.    PHYSICAL EXAM: VS:  BP (!) 146/90   Pulse 100   Ht 5\' 5"  (1.651 m)   Wt 132 lb 12.8 oz (60.2 kg)   BMI 22.10 kg/m  , BMI Body mass index is 22.1 kg/m. GENERAL:  Well appearing.  Anxious HEENT:  Pupils equal round and reactive, fundi not visualized, oral mucosa unremarkable NECK:  No jugular venous distention, waveform within  normal limits, carotid upstroke brisk and symmetric, no bruits, no thyromegaly LYMPHATICS:  No cervical adenopathy LUNGS:  Clear to auscultation bilaterally HEART:  RRR.  PMI not displaced or sustained,S1 and S2 within normal limits, no S3, no S4, no clicks, no rubs, no murmurs ABD:  Flat, positive bowel sounds normal in frequency in pitch, no bruits, no rebound, no guarding, no midline pulsatile mass, no hepatomegaly, no splenomegaly EXT:  2 plus pulses throughout, no edema, no cyanosis no clubbing SKIN:  No rashes no nodules NEURO:  Cranial nerves II  through XII grossly intact, motor grossly intact throughout [facility]:  Cognitively intact, oriented to person place and time   EKG:  EKG is ordered today. The ekg ordered today demonstrates sinus tachycardia.  Rate 100 bpm.  Non-specific    Recent Labs: 10/04/2017: ALT 17; BUN 16.4; Creatinine 0.9; HGB 13.1; Platelets 248; Potassium 3.6; Sodium 144    Lipid Panel No results found for: CHOL, TRIG, HDL, CHOLHDL, VLDL, LDLCALC, LDLDIRECT    Wt Readings from Last 3 Encounters:  10/04/17 132 lb 12.8 oz (60.2 kg)  09/05/17 133 lb (60.3 kg)  05/01/17 110 lb 14.4 oz (50.3 kg)      ASSESSMENT AND PLAN:  # Hypertension: Ms. Dunton blood pressure is poorly-controlled both here, at home, and at other appointments.  Continue amlodipine and add carvedilol 12.5 mg twice daily.  This may also help with her anxiety.  # Recurrent syncope: Ms. Mckercher has experienced recurrent episodes of syncope.  The first sounds like it was vasovagal and orthostatic as it occurred in the middle the night and after urination.  She also was not eating or drinking well at the time.  The second occurred in the setting of working outside in hot weather or not eating or drinking well.  We discussed the importance of increasing her food intake and making sure she maintains adequate hydration.  We will also get an echocardiogram to make sure there is nothing structurally going on,  especially given her family history of heart failure and heart disease.  # Tachycardia/palpitations: Seems attributable to anxiety.  Carvedilol as above.  Discuss anxiety management with PCP.  Continue lexapro.  CMP and CBC unremarkable.  Will check thyroid function at follow up.  Current medicines are reviewed at length with the patient today.  The patient does not have concerns regarding medicines.  The following changes have been made:  Carvedilol 12.5mg  bid.   Labs/ tests ordered today include:   Orders Placed This Encounter  Procedures  . EKG 12-Lead  . ECHOCARDIOGRAM COMPLETE     Disposition:   FU with Otto Caraway C. Oval Linsey, MD, [facility] in 6-8 weeks.     This note was written with the assistance of speech recognition software.  Please excuse any transcriptional errors.  Signed, Lajean Boese C. Oval Linsey, MD, [facility]  10/05/2017 6:11 PM    [place]

## 2017-10-04 NOTE — Patient Instructions (Signed)
Medication Instructions:  START CARVEDILOL 12.5 MG TWICE A DAY   Labwork: NONE   Testing/Procedures: Your physician has requested that you have an echocardiogram. Echocardiography is a painless test that uses sound waves to create images of your heart. It provides your doctor with information about the size and shape of your heart and how well your heart's chambers and valves are working. This procedure takes approximately one hour. There are no restrictions for this procedure. [facility] [place] [address]   Follow-Up: Your physician recommends that you schedule a follow-up appointment in: 2 MONTHS OV   Any Other Special Instructions Will Be Listed Below (If Applicable). MONITOR YOUR BLOOD PRESSURE AT HOME AND BRING THE READINGS TO YOUR FOLLOW UP   If you need a refill on your cardiac medications before your next appointment, please call your pharmacy.

## 2017-10-05 ENCOUNTER — Encounter: Payer: Self-pay | Admitting: Cardiovascular Disease

## 2017-10-10 ENCOUNTER — Other Ambulatory Visit: Payer: Self-pay

## 2017-10-10 ENCOUNTER — Ambulatory Visit (HOSPITAL_COMMUNITY): Payer: Medicare Other | Attending: Cardiovascular Disease

## 2017-10-10 DIAGNOSIS — R55 Syncope and collapse: Secondary | ICD-10-CM

## 2017-10-10 DIAGNOSIS — I10 Essential (primary) hypertension: Secondary | ICD-10-CM | POA: Insufficient documentation

## 2017-10-16 ENCOUNTER — Ambulatory Visit: Payer: Self-pay | Admitting: Hematology and Oncology

## 2017-10-17 ENCOUNTER — Ambulatory Visit: Payer: Self-pay | Admitting: Hematology and Oncology

## 2017-10-21 DIAGNOSIS — M79641 Pain in right hand: Secondary | ICD-10-CM | POA: Diagnosis not present

## 2017-10-21 DIAGNOSIS — M65311 Trigger thumb, right thumb: Secondary | ICD-10-CM | POA: Diagnosis not present

## 2017-10-21 DIAGNOSIS — R200 Anesthesia of skin: Secondary | ICD-10-CM | POA: Diagnosis not present

## 2017-10-21 DIAGNOSIS — M79642 Pain in left hand: Secondary | ICD-10-CM | POA: Diagnosis not present

## 2017-10-31 ENCOUNTER — Inpatient Hospital Stay: Payer: Medicare Other

## 2017-10-31 ENCOUNTER — Telehealth: Payer: Self-pay | Admitting: Hematology and Oncology

## 2017-10-31 NOTE — Telephone Encounter (Signed)
Per patient request - rescheduled appt from 12/27 - left message for patient with appt date and time.

## 2017-11-01 ENCOUNTER — Ambulatory Visit (HOSPITAL_BASED_OUTPATIENT_CLINIC_OR_DEPARTMENT_OTHER): Payer: Medicare Other

## 2017-11-01 ENCOUNTER — Other Ambulatory Visit (HOSPITAL_BASED_OUTPATIENT_CLINIC_OR_DEPARTMENT_OTHER): Payer: Medicare Other

## 2017-11-01 ENCOUNTER — Encounter: Payer: Self-pay | Admitting: Pharmacist

## 2017-11-01 VITALS — BP 165/94 | HR 88 | Temp 98.4°F | Resp 20

## 2017-11-01 DIAGNOSIS — C50411 Malignant neoplasm of upper-outer quadrant of right female breast: Secondary | ICD-10-CM

## 2017-11-01 DIAGNOSIS — Z5111 Encounter for antineoplastic chemotherapy: Secondary | ICD-10-CM | POA: Diagnosis present

## 2017-11-01 DIAGNOSIS — Z170 Estrogen receptor positive status [ER+]: Secondary | ICD-10-CM

## 2017-11-01 LAB — CBC WITH DIFFERENTIAL/PLATELET
BASO%: 0.6 % (ref 0.0–2.0)
Basophils Absolute: 0 10*3/uL (ref 0.0–0.1)
EOS%: 1.9 % (ref 0.0–7.0)
Eosinophils Absolute: 0.1 10*3/uL (ref 0.0–0.5)
HCT: 40.1 % (ref 34.8–46.6)
HGB: 13.1 g/dL (ref 11.6–15.9)
LYMPH%: 33.8 % (ref 14.0–49.7)
MCH: 29.3 pg (ref 25.1–34.0)
MCHC: 32.8 g/dL (ref 31.5–36.0)
MCV: 89.3 fL (ref 79.5–101.0)
MONO#: 0.7 10*3/uL (ref 0.1–0.9)
MONO%: 8.8 % (ref 0.0–14.0)
NEUT#: 4.1 10*3/uL (ref 1.5–6.5)
NEUT%: 54.9 % (ref 38.4–76.8)
Platelets: 227 10*3/uL (ref 145–400)
RBC: 4.49 10*6/uL (ref 3.70–5.45)
RDW: 16 % — ABNORMAL HIGH (ref 11.2–14.5)
WBC: 7.5 10*3/uL (ref 3.9–10.3)
lymph#: 2.5 10*3/uL (ref 0.9–3.3)

## 2017-11-01 LAB — COMPREHENSIVE METABOLIC PANEL
ALT: 14 U/L (ref 0–55)
AST: 12 U/L (ref 5–34)
Albumin: 3.6 g/dL (ref 3.5–5.0)
Alkaline Phosphatase: 121 U/L (ref 40–150)
Anion Gap: 7 mEq/L (ref 3–11)
BUN: 15.3 mg/dL (ref 7.0–26.0)
CO2: 26 mEq/L (ref 22–29)
Calcium: 8.9 mg/dL (ref 8.4–10.4)
Chloride: 108 mEq/L (ref 98–109)
Creatinine: 0.8 mg/dL (ref 0.6–1.1)
EGFR: >60 mL/min/{1.73_m2} (ref >60)
Glucose: 81 mg/dl (ref 70–140)
Potassium: 3.5 mEq/L (ref 3.5–5.1)
Sodium: 141 mEq/L (ref 136–145)
Total Bilirubin: 0.23 mg/dL (ref 0.20–1.20)
Total Protein: 6.5 g/dL (ref 6.4–8.3)

## 2017-11-01 MED ORDER — GOSERELIN ACETATE 3.6 MG ~~LOC~~ IMPL
3.6000 mg | DRUG_IMPLANT | Freq: Once | SUBCUTANEOUS | Status: AC
  Administered 2017-11-01: 3.6 mg via SUBCUTANEOUS
  Filled 2017-11-01: qty 3.6

## 2017-11-01 NOTE — Patient Instructions (Signed)
Goserelin injection What is this medicine? GOSERELIN (GOE se rel in) is similar to a hormone found in the body. It lowers the amount of sex hormones that the body makes. Men will have lower testosterone levels and women will have lower estrogen levels while taking this medicine. In men, this medicine is used to treat prostate cancer; the injection is either given once per month or once every 12 weeks. A once per month injection (only) is used to treat women with endometriosis, dysfunctional uterine bleeding, or advanced breast cancer. This medicine may be used for other purposes; ask your health care provider or pharmacist if you have questions. COMMON BRAND NAME(S): Zoladex What should I tell my health care provider before I take this medicine? They need to know if you have any of these conditions (some only apply to women): -diabetes -heart disease or previous heart attack -high blood pressure -high cholesterol -kidney disease -osteoporosis or low bone density -problems passing urine -spinal cord injury -stroke -tobacco smoker -an unusual or allergic reaction to goserelin, hormone therapy, other medicines, foods, dyes, or preservatives -pregnant or trying to get pregnant -breast-feeding How should I use this medicine? This medicine is for injection under the skin. It is given by a health care professional in a hospital or clinic setting. Men receive this injection once every 4 weeks or once every 12 weeks. Women will only receive the once every 4 weeks injection. Talk to your pediatrician regarding the use of this medicine in children. Special care may be needed. Overdosage: If you think you have taken too much of this medicine contact a poison control center or emergency room at once. NOTE: This medicine is only for you. Do not share this medicine with others. What if I miss a dose? It is important not to miss your dose. Call your doctor or health care professional if you are unable to  keep an appointment. What may interact with this medicine? -female hormones like estrogen -herbal or dietary supplements like black cohosh, chasteberry, or DHEA -female hormones like testosterone -prasterone This list may not describe all possible interactions. Give your health care provider a list of all the medicines, herbs, non-prescription drugs, or dietary supplements you use. Also tell them if you smoke, drink alcohol, or use illegal drugs. Some items may interact with your medicine. What should I watch for while using this medicine? Visit your doctor or health care professional for regular checks on your progress. Your symptoms may appear to get worse during the first weeks of this therapy. Tell your doctor or healthcare professional if your symptoms do not start to get better or if they get worse after this time. Your bones may get weaker if you take this medicine for a long time. If you smoke or frequently drink alcohol you may increase your risk of bone loss. A family history of osteoporosis, chronic use of drugs for seizures (convulsions), or corticosteroids can also increase your risk of bone loss. Talk to your doctor about how to keep your bones strong. This medicine should stop regular monthly menstration in women. Tell your doctor if you continue to menstrate. Women should not become pregnant while taking this medicine or for 12 weeks after stopping this medicine. Women should inform their doctor if they wish to become pregnant or think they might be pregnant. There is a potential for serious side effects to an unborn child. Talk to your health care professional or pharmacist for more information. Do not breast-feed an infant while taking   this medicine. Men should inform their doctors if they wish to father a child. This medicine may lower sperm counts. Talk to your health care professional or pharmacist for more information. What side effects may I notice from receiving this  medicine? Side effects that you should report to your doctor or health care professional as soon as possible: -allergic reactions like skin rash, itching or hives, swelling of the face, lips, or tongue -bone pain -breathing problems -changes in vision -chest pain -feeling faint or lightheaded, falls -fever, chills -pain, swelling, warmth in the leg -pain, tingling, numbness in the hands or feet -signs and symptoms of low blood pressure like dizziness; feeling faint or lightheaded, falls; unusually weak or tired -stomach pain -swelling of the ankles, feet, hands -trouble passing urine or change in the amount of urine -unusually high or low blood pressure -unusually weak or tired Side effects that usually do not require medical attention (report to your doctor or health care professional if they continue or are bothersome): -change in sex drive or performance -changes in breast size in both males and females -changes in emotions or moods -headache -hot flashes -irritation at site where injected -loss of appetite -skin problems like acne, dry skin -vaginal dryness This list may not describe all possible side effects. Call your doctor for medical advice about side effects. You may report side effects to FDA at 1-800-FDA-1088. Where should I keep my medicine? This drug is given in a hospital or clinic and will not be stored at home. NOTE: This sheet is a summary. It may not cover all possible information. If you have questions about this medicine, talk to your doctor, pharmacist, or health care provider.  2018 Elsevier/Gold Standard (2013-12-29 11:10:35)  

## 2017-11-01 NOTE — Progress Notes (Signed)
Oral Chemotherapy Pharmacist Encounter  Dispensed samples to patient:  Medication: Femara (letrozole) 2.5mg  tablets Instructions: take 1 tablet by mouth once daily Quantity dispensed: 90 Days supply: 90 Manufacturer: Novartis Lot: [id number] Exp: 09/19  Patient without prescription insurance coverage. In the event samples are not available in the future, 1 month supply of Femara is $9 cash price. This information will be shared with the patient.  Johny Drilling, PharmD, BCPS, BCOP 11/01/2017 2:44 PM Oral Oncology Clinic [phone]

## 2017-11-12 ENCOUNTER — Telehealth: Payer: Self-pay

## 2017-11-12 ENCOUNTER — Other Ambulatory Visit: Payer: Self-pay

## 2017-11-12 DIAGNOSIS — N898 Other specified noninflammatory disorders of vagina: Secondary | ICD-10-CM

## 2017-11-12 NOTE — Progress Notes (Signed)
Pt called to request gynecology appt due to symptoms of new vaginal discharge and painful intercourse. Pt lvm. Will call pt back to obtain more information and refer to gynecology for further evaluation.

## 2017-11-12 NOTE — Telephone Encounter (Signed)
Returned pt phone call regarding request for referral for gyn for some discomfort that she had been experiencing. Left vm with call back number.

## 2017-11-13 ENCOUNTER — Telehealth: Payer: Self-pay

## 2017-11-13 ENCOUNTER — Telehealth: Payer: Self-pay | Admitting: Obstetrics & Gynecology

## 2017-11-13 NOTE — Telephone Encounter (Signed)
Received VM from patient regarding complaints of vaginal discharge x 2 weeks with pelvic pain during intercourse.  Left her a VM inquiring whether she has a OB/Gyn of her choice to schedule an appt there regarding these complaints.  If not, left her the contact information for [facility] if she'd like to call to schedule an appt there. Left our contact information as well if she has any additional questions.

## 2017-11-13 NOTE — Telephone Encounter (Signed)
Spoke with pt and would like to get in to see a gynecologist to be evaluated for some new symptoms that she had been experiencing.  Pt reports a brown thin watery discharge x2 weeks. No odor, or bleeding. Pt states that she is also having painful intercourse. Reports pelvic pain still goes on and off after intercourse. Pt does have some bloating and recently had a lump on R ovary that is now resolved. Pt last pap smear was 5 or 62yrs ago from [facility] and pt ended up with fibroid tumors. She opted for a hysterectomy and has not had a problem since. Pt would like to know if a referral to get her in the gynecologist would help her get an appt right away. Pt had called [facility] and will be getting an appt with Dr.Miller. Told pt to keep [name] updated once she goes for her appt. Pt verbalized understanding and has no further concerns at this time.

## 2017-11-13 NOTE — Telephone Encounter (Signed)
Returned call to [employer].

## 2017-11-13 NOTE — Telephone Encounter (Signed)
Called and left a message for patient to call back to schedule a new patient doctor referral appointment with our office vaginal discharge and painful intercourse.

## 2017-11-18 ENCOUNTER — Telehealth: Payer: Self-pay | Admitting: Cardiovascular Disease

## 2017-11-18 NOTE — Telephone Encounter (Signed)
Left message to call back  Reviewed chart and do not see where Dr Oval Linsey referred to neurologist

## 2017-11-18 NOTE — Telephone Encounter (Signed)
New Message   Patient is calling in reference to a referral that was sent to a neurologist on her behalf. She states that she does not want that one but would like to be referred to a different neuro,logist. The one she wants to see is Dr. Asencion Partridge Dohmeier. She is at [place]. Dr, Dohmeir  number is [phone].

## 2017-11-20 NOTE — Telephone Encounter (Signed)
Returned call to patient. Explained that MD note from Oct 04, 2017 does not reference a referral to neurology on her behalf. She is insistent that Dr. Oval Linsey referred her d/t numbness in her hands. She states she got a call from [facility] Bary Leriche, M.D's office to schedule an appointment but she prefers to have a referral to Dr. Asencion Partridge Dohmeier   Advised would sent to Dr. Oval Linsey & Rip Harbour

## 2017-11-20 NOTE — Telephone Encounter (Signed)
New message ° °Pt verbalized that she is returning call for RN °

## 2017-11-20 NOTE — Telephone Encounter (Signed)
Spoke with patient and explained referral did not come from Dr Oval Linsey. She will follow up with PCP. Did offer to schedule her 2 month follow up and she declined and does not feel she needs a cardiologist at this time.

## 2017-11-22 ENCOUNTER — Telehealth: Payer: Self-pay

## 2017-11-22 NOTE — Telephone Encounter (Signed)
Returned pt call. She is stating she needs a referral to neurology at [facility] with Dr. Asencion Partridge. Informed her Dr. Lindi Adie has left for the day but will talk with him on Monday and get back to her regarding the referral.  Cyndia Bent RN

## 2017-11-29 ENCOUNTER — Telehealth: Payer: Self-pay

## 2017-11-29 ENCOUNTER — Inpatient Hospital Stay: Payer: Medicare Other

## 2017-11-29 NOTE — Telephone Encounter (Signed)
Spoke with pt stating that she would like to be referred to Dr.Ahern in [facility] neurology for numbness that she had been having in her fingers. Discussed with Dr.Gudena, and will address and evaluate this symptom on her next appt prior to making any referral. Pt agreeable to plan and has no further needs at this time.

## 2017-12-01 NOTE — Assessment & Plan Note (Signed)
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  PET/CT scan 11/12/2014 did not show metastatic disease but showed extensive involvement in the right breast in an around the implant Stage 3B  Right mastectomy 08/29/2015 at [place] (Dr.Singh): IDC with invol of skin and skin ulceration, breast capsule inv cancer, superior medial margin positive, 1/1 LN positive, grade 3, 14.3 cm, 3.5 cm, ALI, chest wall involv, ER 90-100%, PR 80-90%, HER-2 negative, Ki 67 40-50% T4CN1 (St 3B)  Treatment plan:(Patient is premenopausal based on blood work done May 2017, estradiol 250, FSH 5.4) 1. Patient started tamoxifen but developed severe headache with severe hypertension and tamoxifen was discontinued 03/17/2016.   -02/2017 started on Exemestane, Zoladex started 05/29/2017, she does have some joint aches and pains (likely the shoulder issues mentioned today)  Due to affordability issues she will switch to Letrozole daily and will receive samples today from our oral pharmacist Johny Drilling, PharmD.   Patient is currently on disability.  2. Severe hypertension and tachycardia: referral placed to cardiology.   Return to clinic monthly for Zoladex and in 3 months for follow up.

## 2017-12-02 ENCOUNTER — Inpatient Hospital Stay: Payer: Medicare Other | Attending: Hematology and Oncology | Admitting: Hematology and Oncology

## 2017-12-02 ENCOUNTER — Other Ambulatory Visit: Payer: Self-pay

## 2017-12-02 ENCOUNTER — Telehealth: Payer: Self-pay | Admitting: Hematology and Oncology

## 2017-12-02 ENCOUNTER — Inpatient Hospital Stay: Payer: Medicare Other

## 2017-12-02 ENCOUNTER — Ambulatory Visit (INDEPENDENT_AMBULATORY_CARE_PROVIDER_SITE_OTHER): Payer: Medicare Other | Admitting: Obstetrics and Gynecology

## 2017-12-02 ENCOUNTER — Encounter: Payer: Self-pay | Admitting: Obstetrics and Gynecology

## 2017-12-02 VITALS — BP 168/98 | HR 84 | Resp 16 | Ht 65.0 in | Wt 139.0 lb

## 2017-12-02 DIAGNOSIS — N941 Unspecified dyspareunia: Secondary | ICD-10-CM

## 2017-12-02 DIAGNOSIS — I10 Essential (primary) hypertension: Secondary | ICD-10-CM | POA: Diagnosis not present

## 2017-12-02 DIAGNOSIS — Z79899 Other long term (current) drug therapy: Secondary | ICD-10-CM | POA: Insufficient documentation

## 2017-12-02 DIAGNOSIS — Z853 Personal history of malignant neoplasm of breast: Secondary | ICD-10-CM | POA: Diagnosis not present

## 2017-12-02 DIAGNOSIS — N760 Acute vaginitis: Secondary | ICD-10-CM | POA: Diagnosis not present

## 2017-12-02 DIAGNOSIS — Z170 Estrogen receptor positive status [ER+]: Secondary | ICD-10-CM

## 2017-12-02 DIAGNOSIS — Z9011 Acquired absence of right breast and nipple: Secondary | ICD-10-CM | POA: Diagnosis not present

## 2017-12-02 DIAGNOSIS — C50411 Malignant neoplasm of upper-outer quadrant of right female breast: Secondary | ICD-10-CM

## 2017-12-02 DIAGNOSIS — N952 Postmenopausal atrophic vaginitis: Secondary | ICD-10-CM | POA: Diagnosis not present

## 2017-12-02 LAB — COMPREHENSIVE METABOLIC PANEL
ALT: 14 U/L (ref 0–55)
AST: 14 U/L (ref 5–34)
Albumin: 4 g/dL (ref 3.5–5.0)
Alkaline Phosphatase: 129 U/L (ref 40–150)
Anion gap: 8 (ref 3–11)
BUN: 14 mg/dL (ref 7–26)
CO2: 28 mmol/L (ref 22–29)
Calcium: 9.8 mg/dL (ref 8.4–10.4)
Chloride: 107 mmol/L (ref 98–109)
Creatinine, Ser: 0.84 mg/dL (ref 0.60–1.10)
GFR calc Af Amer: >60 mL/min (ref >60)
GFR calc non Af Amer: >60 mL/min (ref >60)
Glucose, Bld: 97 mg/dL (ref 70–140)
Potassium: 3.4 mmol/L (ref 3.3–4.7)
Sodium: 143 mmol/L (ref 136–145)
Total Bilirubin: 0.4 mg/dL (ref 0.2–1.2)
Total Protein: 7.3 g/dL (ref 6.4–8.3)

## 2017-12-02 LAB — CBC WITH DIFFERENTIAL/PLATELET
Basophils Absolute: 0 10*3/uL (ref 0.0–0.1)
Basophils Relative: 0 %
Eosinophils Absolute: 0.1 10*3/uL (ref 0.0–0.5)
Eosinophils Relative: 2 %
HCT: 40.7 % (ref 34.8–46.6)
Hemoglobin: 14 g/dL (ref 11.6–15.9)
Lymphocytes Relative: 35 %
Lymphs Abs: 2.3 10*3/uL (ref 0.9–3.3)
MCH: 31 pg (ref 25.1–34.0)
MCHC: 34.4 g/dL (ref 31.5–36.0)
MCV: 90.2 fL (ref 79.5–101.0)
Monocytes Absolute: 0.4 10*3/uL (ref 0.1–0.9)
Monocytes Relative: 6 %
Neutro Abs: 3.7 10*3/uL (ref 1.5–6.5)
Neutrophils Relative %: 57 %
Platelets: 270 10*3/uL (ref 145–400)
RBC: 4.51 MIL/uL (ref 3.70–5.45)
RDW: 13.8 % (ref 11.2–16.1)
WBC: 6.5 10*3/uL (ref 3.9–10.3)

## 2017-12-02 MED ORDER — METRONIDAZOLE 0.75 % VA GEL: 1.0000 | Freq: Every day | VAGINAL | 0 refills | Status: DC

## 2017-12-02 MED ORDER — GOSERELIN ACETATE 3.6 MG ~~LOC~~ IMPL
3.6000 mg | DRUG_IMPLANT | Freq: Once | SUBCUTANEOUS | Status: AC
  Administered 2017-12-02: 3.6 mg via SUBCUTANEOUS

## 2017-12-02 NOTE — Progress Notes (Signed)
Patient Care Team: Nicholas Lose, MD as PCP - General (Hematology and Oncology)  DIAGNOSIS:  Encounter Diagnosis  Name Primary?  . Malignant neoplasm of upper-outer quadrant of right breast in female, estrogen receptor positive ([place])     SUMMARY OF ONCOLOGIC HISTORY:   Breast cancer of upper-outer quadrant of right female breast ([place])   09/21/2010 Mammogram    right breast linear and segmental pleomorphic calcifications from 4:00 to 6:00 position ultrasound revealed 1.6 cm and 1.1 cm masses      09/27/2010 Initial Biopsy    ultrasound-guided biopsy of all masses showed DCIS grade 2; patient went to cancer treatment centers of [place] for second opinion and delayed therapy      01/26/2011 Surgery    right mastectomy followed by reconstruction: Invasive ductal carcinoma T1 N1 MIC M0 stage IB ER/PR positive HER-2 negative, BRCA negative, Oncotype DX low risk      05/29/2012 Procedure    right chest wall nodule excision done on 06/24/2012 showed metastatic carcinoma margins were positive, but CT scan no metastatic disease, recommended chemotherapy but patient refused also refused reexcision      04/28/2013 Treatment Plan Change    patient went to [place] for alternative treatments and took herbal medications, tonics etc. But she could not afford these trips.      01/08/2014 Breast MRI    right breast multiple enhancing masses within the soft tissues largest 5.6 cm in wall skin surface and the capsule of the silicone prosthesis, contiguous nodules involving in inferomedial breast and tired and subcentimeter nodules across the midline      08/29/2015 Surgery    Right mastectomy: IDC with invol of skin and skin ulceration, breast capsule inv cancer, superior medial margin positive, 1/1 LN positive, grade 3, 14.3 cm, 3.5 cm, ALI, chest wall involv, ER 90-100%, PR 80-90%, HER-2 negative, Ki 67 40-50% T4CN1 (St 3B)      03/05/2016 -  Anti-estrogen oral therapy    Tamoxifen 20 mg daily  stopped due to headache and uncontrolled hypertension. Decrease to 10 mg daily 04/03/2016, stopped June 2017 and took an estrogen metabolizer over-the-counter; started Aromasin April 2018 from a [place] physician, Zoladex started on 05/2017 and switched to Letrozole in 09/2017       CHIEF COMPLIANT: Follow-up on Zoladex with letrozole  INTERVAL HISTORY: Misty Arnold is a 47 year old with above-mentioned history of fungating tumor in the right breast who is currently on Zoladex with letrozole.  She appears to be tolerating Zoladex extremely well.  She feels that the hands are very stiff as a result of letrozole therapy.  She did not notice any of the symptoms when she was on anastrozole.  We started giving her samples of the letrozole.  She thinks that the tumor on the chest wall has shrunk significantly.  REVIEW OF SYSTEMS:   Constitutional: Denies fevers, chills or abnormal weight loss Eyes: Denies blurriness of vision Ears, nose, mouth, throat, and face: Denies mucositis or sore throat Respiratory: Denies cough, dyspnea or wheezes Cardiovascular: Denies palpitation, chest discomfort Gastrointestinal:  Denies nausea, heartburn or change in bowel habits Skin: Denies abnormal skin rashes Lymphatics: Denies new lymphadenopathy or easy bruising Neurological:Denies numbness, tingling or new weaknesses Behavioral/Psych: Mood is stable, no new changes  Extremities: No lower extremity edema Breast:  denies any pain or lumps or nodules in either breasts All other systems were reviewed with the patient and are negative.  I have reviewed the past medical history, past surgical history, social history and  family history with the patient and they are unchanged from previous note.  ALLERGIES:  is allergic to penicillins.  MEDICATIONS:  Current Outpatient Medications  Medication Sig Dispense Refill  . amLODipine (NORVASC) 10 MG tablet Take by mouth.    Marland Kitchen Bioflavonoid Products (ESTER C PO) Take by  mouth.    Marland Kitchen BLACK CURRANT SEED OIL PO Take by mouth.    . Cholecalciferol (VITAMIN D PO) Take 5,000 Units by mouth 2 (two) times daily.     . Fish Oil OIL by Does not apply route.    . Flaxseed Oil OIL by Does not apply route.    Marland Kitchen goserelin (ZOLADEX) 3.6 MG injection Inject 3.6 mg into the skin every 28 (twenty-eight) days.    Marland Kitchen letrozole (FEMARA) 2.5 MG tablet Take 1 tablet (2.5 mg total) by mouth daily. 60 tablet 0  . lidocaine-prilocaine (EMLA) cream Apply 1 application topically as needed. Apply 1 hr prior to zoladex injection. 30 g 0  . metroNIDAZOLE (METROGEL) 0.75 % vaginal gel Place 1 Applicatorful vaginally at bedtime. For 5 nights 70 g 0  . Misc Natural Products (DANDELION ROOT PO) Take by mouth.    . [place], Absinthium, Oil OIL by Does not apply route.     No current facility-administered medications for this visit.     PHYSICAL EXAMINATION: ECOG PERFORMANCE STATUS: 1 - Symptomatic but completely ambulatory  Vitals:   12/02/17 1452  BP: (!) 176/102  Pulse: 84  Resp: 18  Temp: 97.9 F (36.6 C)  SpO2: 100%   Filed Weights   12/02/17 1452  Weight: 139 lb 11.2 oz (63.4 kg)    GENERAL:alert, no distress and comfortable SKIN: skin color, texture, turgor are normal, no rashes or significant lesions EYES: normal, Conjunctiva are pink and non-injected, sclera clear OROPHARYNX:no exudate, no erythema and lips, buccal mucosa, and tongue normal  NECK: supple, thyroid normal size, non-tender, without nodularity LYMPH:  no palpable lymphadenopathy in the cervical, axillary or inguinal LUNGS: clear to auscultation and percussion with normal breathing effort HEART: regular rate & rhythm and no murmurs and no lower extremity edema ABDOMEN:abdomen soft, non-tender and normal bowel sounds MUSCULOSKELETAL:no cyanosis of digits and no clubbing  NEURO: alert & oriented x 3 with fluent speech, no focal motor/sensory deficits EXTREMITIES: No lower extremity edema BREAST: No palpable  masses or nodules in either right or left breasts. No palpable axillary supraclavicular or infraclavicular adenopathy no breast tenderness or nipple discharge. (exam performed in the presence of a chaperone)  LABORATORY DATA:  I have reviewed the data as listed CMP Latest Ref Rng & Units 12/02/2017 11/01/2017 10/04/2017  Glucose 70 - 140 mg/dL 97 81 96  BUN 7 - 26 mg/dL 14 15.3 16.4  Creatinine 0.60 - 1.10 mg/dL 0.84 0.8 0.9  Sodium 136 - 145 mmol/L 143 141 144  Potassium 3.3 - 4.7 mmol/L 3.4 3.5 3.6  Chloride 98 - 109 mmol/L 107 - -  CO2 22 - 29 mmol/L 28 26 28   Calcium 8.4 - 10.4 mg/dL 9.8 8.9 9.5  Total Protein 6.4 - 8.3 g/dL 7.3 6.5 7.5  Total Bilirubin 0.2 - 1.2 mg/dL 0.4 0.23 0.31  Alkaline Phos 40 - 150 U/L 129 121 137  AST 5 - 34 U/L 14 12 16   ALT 0 - 55 U/L 14 14 17     Lab Results  Component Value Date   WBC 6.5 12/02/2017   HGB 14.0 12/02/2017   HCT 40.7 12/02/2017   MCV 90.2 12/02/2017  PLT 270 12/02/2017   NEUTROABS 3.7 12/02/2017    ASSESSMENT & PLAN:  Breast cancer of upper-outer quadrant of right female breast ([place]) 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  PET/CT scan 11/12/2014 did not show metastatic disease but showed extensive involvement in the right breast in an around the implant Stage 3B  Right mastectomy 08/29/2015 at [place] (Dr.Singh): IDC with invol of skin and skin ulceration, breast capsule inv cancer, superior medial margin positive, 1/1 LN positive, grade 3, 14.3 cm, 3.5 cm, ALI, chest wall involv, ER 90-100%, PR 80-90%, HER-2 negative, Ki 67 40-50% T4CN1 (St 3B)  Treatment plan:(Patient is premenopausal based on blood work done May 2017, estradiol 250, FSH  5.4) 1. Patient started tamoxifen but developed severe headache with severe hypertension and tamoxifen was discontinued 03/17/2016.   -02/2017 started on Exemestane, Zoladex started 05/29/2017, she does have some joint aches and pains (likely the shoulder issues mentioned today)  Due to affordability issues she will switch to Letrozole daily and will receive samples today from our oral pharmacist Johny Drilling, PharmD.   Patient is currently on disability.  2. Severe hypertension and tachycardia: referral placed to cardiology.   Return to clinic monthly for Zoladex and in 3 months for follow up.     I spent 25 minutes talking to the patient of which more than half was spent in counseling and coordination of care.  No orders of the defined types were placed in this encounter.  The patient has a good understanding of the overall plan. she agrees with it. she will call with any problems that may develop before the next visit here.   Harriette Ohara, MD 12/02/17

## 2017-12-02 NOTE — Patient Instructions (Signed)
Goserelin injection What is this medicine? GOSERELIN (GOE se rel in) is similar to a hormone found in the body. It lowers the amount of sex hormones that the body makes. Men will have lower testosterone levels and women will have lower estrogen levels while taking this medicine. In men, this medicine is used to treat prostate cancer; the injection is either given once per month or once every 12 weeks. A once per month injection (only) is used to treat women with endometriosis, dysfunctional uterine bleeding, or advanced breast cancer. This medicine may be used for other purposes; ask your health care provider or pharmacist if you have questions. COMMON BRAND NAME(S): Zoladex What should I tell my health care provider before I take this medicine? They need to know if you have any of these conditions (some only apply to women): -diabetes -heart disease or previous heart attack -high blood pressure -high cholesterol -kidney disease -osteoporosis or low bone density -problems passing urine -spinal cord injury -stroke -tobacco smoker -an unusual or allergic reaction to goserelin, hormone therapy, other medicines, foods, dyes, or preservatives -pregnant or trying to get pregnant -breast-feeding How should I use this medicine? This medicine is for injection under the skin. It is given by a health care professional in a hospital or clinic setting. Men receive this injection once every 4 weeks or once every 12 weeks. Women will only receive the once every 4 weeks injection. Talk to your pediatrician regarding the use of this medicine in children. Special care may be needed. Overdosage: If you think you have taken too much of this medicine contact a poison control center or emergency room at once. NOTE: This medicine is only for you. Do not share this medicine with others. What if I miss a dose? It is important not to miss your dose. Call your doctor or health care professional if you are unable to  keep an appointment. What may interact with this medicine? -female hormones like estrogen -herbal or dietary supplements like black cohosh, chasteberry, or DHEA -female hormones like testosterone -prasterone This list may not describe all possible interactions. Give your health care provider a list of all the medicines, herbs, non-prescription drugs, or dietary supplements you use. Also tell them if you smoke, drink alcohol, or use illegal drugs. Some items may interact with your medicine. What should I watch for while using this medicine? Visit your doctor or health care professional for regular checks on your progress. Your symptoms may appear to get worse during the first weeks of this therapy. Tell your doctor or healthcare professional if your symptoms do not start to get better or if they get worse after this time. Your bones may get weaker if you take this medicine for a long time. If you smoke or frequently drink alcohol you may increase your risk of bone loss. A family history of osteoporosis, chronic use of drugs for seizures (convulsions), or corticosteroids can also increase your risk of bone loss. Talk to your doctor about how to keep your bones strong. This medicine should stop regular monthly menstration in women. Tell your doctor if you continue to menstrate. Women should not become pregnant while taking this medicine or for 12 weeks after stopping this medicine. Women should inform their doctor if they wish to become pregnant or think they might be pregnant. There is a potential for serious side effects to an unborn child. Talk to your health care professional or pharmacist for more information. Do not breast-feed an infant while taking   this medicine. Men should inform their doctors if they wish to father a child. This medicine may lower sperm counts. Talk to your health care professional or pharmacist for more information. What side effects may I notice from receiving this  medicine? Side effects that you should report to your doctor or health care professional as soon as possible: -allergic reactions like skin rash, itching or hives, swelling of the face, lips, or tongue -bone pain -breathing problems -changes in vision -chest pain -feeling faint or lightheaded, falls -fever, chills -pain, swelling, warmth in the leg -pain, tingling, numbness in the hands or feet -signs and symptoms of low blood pressure like dizziness; feeling faint or lightheaded, falls; unusually weak or tired -stomach pain -swelling of the ankles, feet, hands -trouble passing urine or change in the amount of urine -unusually high or low blood pressure -unusually weak or tired Side effects that usually do not require medical attention (report to your doctor or health care professional if they continue or are bothersome): -change in sex drive or performance -changes in breast size in both males and females -changes in emotions or moods -headache -hot flashes -irritation at site where injected -loss of appetite -skin problems like acne, dry skin -vaginal dryness This list may not describe all possible side effects. Call your doctor for medical advice about side effects. You may report side effects to FDA at 1-800-FDA-1088. Where should I keep my medicine? This drug is given in a hospital or clinic and will not be stored at home. NOTE: This sheet is a summary. It may not cover all possible information. If you have questions about this medicine, talk to your doctor, pharmacist, or health care provider.  2018 Elsevier/Gold Standard (2013-12-29 11:10:35)  

## 2017-12-02 NOTE — Patient Instructions (Signed)
Bacterial Vaginosis Bacterial vaginosis is a vaginal infection that occurs when the normal balance of bacteria in the vagina is disrupted. It results from an overgrowth of certain bacteria. This is the most common vaginal infection among women ages 15-44. Because bacterial vaginosis increases your risk for STIs (sexually transmitted infections), getting treated can help reduce your risk for chlamydia, gonorrhea, herpes, and HIV (human immunodeficiency virus). Treatment is also important for preventing complications in pregnant women, because this condition can cause an early (premature) delivery. What are the causes? This condition is caused by an increase in harmful bacteria that are normally present in small amounts in the vagina. However, the reason that the condition develops is not fully understood. What increases the risk? The following factors may make you more likely to develop this condition:  Having a new sexual partner or multiple sexual partners.  Having unprotected sex.  Douching.  Having an intrauterine device (IUD).  Smoking.  Drug and alcohol abuse.  Taking certain antibiotic medicines.  Being pregnant.  You cannot get bacterial vaginosis from toilet seats, bedding, swimming pools, or contact with objects around you. What are the signs or symptoms? Symptoms of this condition include:  Grey or white vaginal discharge. The discharge can also be watery or foamy.  A fish-like odor with discharge, especially after sexual intercourse or during menstruation.  Itching in and around the vagina.  Burning or pain with urination.  Some women with bacterial vaginosis have no signs or symptoms. How is this diagnosed? This condition is diagnosed based on:  Your medical history.  A physical exam of the vagina.  Testing a sample of vaginal fluid under a microscope to look for a large amount of bad bacteria or abnormal cells. Your health care provider may use a cotton swab  or a small wooden spatula to collect the sample.  How is this treated? This condition is treated with antibiotics. These may be given as a pill, a vaginal cream, or a medicine that is put into the vagina (suppository). If the condition comes back after treatment, a second round of antibiotics may be needed. Follow these instructions at home: Medicines  Take over-the-counter and prescription medicines only as told by your health care provider.  Take or use your antibiotic as told by your health care provider. Do not stop taking or using the antibiotic even if you start to feel better. General instructions  If you have a female sexual partner, tell her that you have a vaginal infection. She should see her health care provider and be treated if she has symptoms. If you have a female sexual partner, he does not need treatment.  During treatment: ? Avoid sexual activity until you finish treatment. ? Do not douche. ? Avoid alcohol as directed by your health care provider. ? Avoid breastfeeding as directed by your health care provider.  Drink enough water and fluids to keep your urine clear or pale yellow.  Keep the area around your vagina and rectum clean. ? Wash the area daily with warm water. ? Wipe yourself from front to back after using the toilet.  Keep all follow-up visits as told by your health care provider. This is important. How is this prevented?  Do not douche.  Wash the outside of your vagina with warm water only.  Use protection when having sex. This includes latex condoms and dental dams.  Limit how many sexual partners you have. To help prevent bacterial vaginosis, it is best to have sex with just   one partner (monogamous).  Make sure you and your sexual partner are tested for STIs.  Wear cotton or cotton-lined underwear.  Avoid wearing tight pants and pantyhose, especially during summer.  Limit the amount of alcohol that you drink.  Do not use any products that  contain nicotine or tobacco, such as cigarettes and e-cigarettes. If you need help quitting, ask your health care provider.  Do not use illegal drugs. Where to find more information:  Centers for Disease Control and Prevention: www.cdc.gov/std  American Sexual Health Association (ASHA): www.ashastd.org  U.S. Department of Health and Human Services, Office on Women's Health: www.womenshealth.gov/ or https://www.womenshealth.gov/a-z-topics/bacterial-vaginosis Contact a health care provider if:  Your symptoms do not improve, even after treatment.  You have more discharge or pain when urinating.  You have a fever.  You have pain in your abdomen.  You have pain during sex.  You have vaginal bleeding between periods. Summary  Bacterial vaginosis is a vaginal infection that occurs when the normal balance of bacteria in the vagina is disrupted.  Because bacterial vaginosis increases your risk for STIs (sexually transmitted infections), getting treated can help reduce your risk for chlamydia, gonorrhea, herpes, and HIV (human immunodeficiency virus). Treatment is also important for preventing complications in pregnant women, because the condition can cause an early (premature) delivery.  This condition is treated with antibiotic medicines. These may be given as a pill, a vaginal cream, or a medicine that is put into the vagina (suppository). This information is not intended to replace advice given to you by your health care provider. Make sure you discuss any questions you have with your health care provider. Document Released: 10/22/2005 Document Revised: 02/25/2017 Document Reviewed: 07/07/2016 Elsevier Interactive Patient Education  2018 Elsevier Inc.  

## 2017-12-02 NOTE — Telephone Encounter (Signed)
Gave patient AVs and calendar of upcoming February through April appointments.  °

## 2017-12-02 NOTE — Progress Notes (Signed)
47 y.o. G0P0000 DivorcedAfrican AmericanF here referred for vaginal discharge X 1 month. Patient is also c/o painful intercourse. She has a h/o advanced breast cancer currently on letrozole and zoladex. At last check she was still menopausal.  She c/o a one month history of watery, clear vaginal d/c. No odor. No itching, burning or irritation. She has the same long term partner of 10 years. Just started having entry dyspareunia in the last month.     No LMP recorded. Patient has had a hysterectomy.          Sexually active: Yes.    The current method of family planning is status post hysterectomy.    Exercising: No.  The patient does not participate in regular exercise at present. Smoker:  no  Health Maintenance: Pap:  2014 WNL per patient  History of abnormal Pap:  Yes - years ago MMG:  Has stage 4 breast cancer- not currently doing treatments Colonoscopy:  Never BMD:   Never TDaP:  unsure Gardasil: N/A   reports that  has never smoked. she has never used smokeless tobacco. She reports that she does not drink alcohol or use drugs.  Past Medical History:  Diagnosis Date  . Abnormal uterine bleeding   . Anemia   . Breast cancer ([place])   . Depression   . Fibroid   . Hormone disorder   . Hypertension   . Infertility, female     Past Surgical History:  Procedure Laterality Date  . ABDOMINAL HYSTERECTOMY    . BREAST SURGERY    . MASTECTOMY    . salivary gland stone      Current Outpatient Medications  Medication Sig Dispense Refill  . amLODipine (NORVASC) 10 MG tablet Take by mouth.    Marland Kitchen Bioflavonoid Products (ESTER C PO) Take by mouth.    Marland Kitchen BLACK CURRANT SEED OIL PO Take by mouth.    . Cholecalciferol (VITAMIN D PO) Take 5,000 Units by mouth 2 (two) times daily.     . Fish Oil OIL by Does not apply route.    . Flaxseed Oil OIL by Does not apply route.    Marland Kitchen goserelin (ZOLADEX) 3.6 MG injection Inject 3.6 mg into the skin every 28 (twenty-eight) days.    Marland Kitchen letrozole  (FEMARA) 2.5 MG tablet Take 1 tablet (2.5 mg total) by mouth daily. 60 tablet 0  . lidocaine-prilocaine (EMLA) cream Apply 1 application topically as needed. Apply 1 hr prior to zoladex injection. 30 g 0  . Misc Natural Products (DANDELION ROOT PO) Take by mouth.    . [place], Absinthium, Oil OIL by Does not apply route.     No current facility-administered medications for this visit.     Family History  Problem Relation Age of Onset  . Cancer Maternal Aunt   . Stroke Maternal Aunt   . Heart attack Maternal Aunt   . Cancer Maternal Grandfather   . Colon cancer Maternal Grandfather   . Stroke Maternal Grandfather   . Cancer Paternal Grandfather   . Colon cancer Paternal Grandfather   . Stroke Mother   . Heart attack Mother   . Diabetes Mother   . Hyperlipidemia Mother   . Diabetes Father   . Stroke Father   . Stroke Paternal Grandmother   . Heart attack Paternal Grandmother   . Heart failure Paternal Grandmother   . Diabetes Paternal Grandmother   . Hyperlipidemia Paternal Grandmother   . Stroke Maternal Grandmother     Review of Systems  Constitutional: Negative.   HENT: Negative.   Eyes: Negative.   Respiratory: Negative.   Cardiovascular: Negative.   Gastrointestinal: Negative.   Endocrine: Negative.   Genitourinary: Positive for dyspareunia and vaginal discharge.  Musculoskeletal: Negative.   Skin: Negative.   Allergic/Immunologic: Negative.   Neurological: Negative.   Psychiatric/Behavioral: Negative.     Exam:   BP (!) 168/98 (BP Location: Left Arm, Patient Position: Sitting, Cuff Size: Normal)   Pulse 84   Resp 16   Ht 5\' 5"  (1.651 m)   Wt 139 lb (63 kg)   BMI 23.13 kg/m   Weight change: @WEIGHTCHANGE @ Height:   Height: 5\' 5"  (165.1 cm)  Ht Readings from Last 3 Encounters:  12/02/17 5\' 5"  (1.651 m)  10/04/17 5\' 5"  (1.651 m)  09/05/17 5\' 5"  (1.651 m)    General appearance: alert, cooperative and appears stated age   Pelvic: External genitalia:   no lesions              Urethra:  normal appearing urethra with no masses, tenderness or lesions              Bartholins and Skenes: normal                 Vagina: erythematous, increase in watery, yellow vaginal d/c, some red spots in the upper vagina and cervix              Cervix: no lesions               Bimanual Exam:  Uterus:  uterus absent              Adnexa: no mass, fullness, tenderness                Chaperone was present for exam.  Wet prep: ++ clue, no trich, ++ wbc KOH: no yeast PH: 5.5   A:  Vaginal discharge, vaginal slides with BV and ++ WBC, no trich, no yeast  Entry dyspareunia, definitely has a component of vaginal atrophy  Advanced breast cancer, on Zoladex and Letrozole   P:   Treat BV  Given the high # of WBC will send of an Affirm  If she continue to have symptoms, I recommended she return after being treated for BV to check for DIV  She has a h/o a [facility], still has a cervix  Discussed returning for an annual exam and pap

## 2017-12-03 ENCOUNTER — Telehealth: Payer: Self-pay

## 2017-12-03 ENCOUNTER — Encounter: Payer: Self-pay | Admitting: Hematology and Oncology

## 2017-12-03 LAB — VAGINITIS/VAGINOSIS, DNA PROBE
Candida Species: NEGATIVE
Gardnerella vaginalis: NEGATIVE
Trichomonas vaginosis: POSITIVE — AB

## 2017-12-03 MED ORDER — METRONIDAZOLE 500 MG PO TABS
500.0000 mg | ORAL_TABLET | Freq: Two times a day (BID) | ORAL | 0 refills | Status: DC
Start: 2017-12-03 — End: 2018-02-03

## 2017-12-03 NOTE — Telephone Encounter (Signed)
-----   Message from Salvadore Dom, MD sent at 12/03/2017 12:53 PM EST ----- Please call the patient and let her know that she has Trich. This is a STD. Both she and her partner need to be treated. I called in metrogel yesterday for BV, she can stop that because she needs oral treatment. Even though no BV on Affirm, BV seen under the microscope. Please treat her with Flagyl 500 mg BID x 7 days. 2 weeks after completing treatment she needs to be rechecked for trich and should also have other STD testing done. Her partner can be treated with flagyl 2 gm po x 1 (check for allergies). They should avoid intercourse until one week after both of their treatment is completed and should use condoms. He should also have full STD testing (even if he was negative for trich he still needs treatment for this secondary to false negative testing). No ETOH while on flagyl or for 24 hours after completing.

## 2017-12-03 NOTE — Progress Notes (Signed)
Returned call from voicemail left on yesterday afternoon regarding financial assistance.  Left voicemail with my contact name and number.

## 2017-12-03 NOTE — Telephone Encounter (Signed)
Spoke with patient. Results given as seen below. Patient verbalizes understanding. Rx for Flagyl 500 mg po BID x 7 days sent to pharmacy on file. Avoid alcohol during treatment and 24 hours after completing medication. Don't mix with alcohol if mixed can cause severe nausea, vomiting and abdominal cramping. Aware she will need to abstain from intercourse until she and her partner have received treatment and for 1 week following. Will need to use condoms for protection against STD's with intercourse. Expedited partner therapy reviewed with patient. Rx for partner to [place] for review and signature. Patient is aware she will need to pick this up from the office. 2 week follow up for patient scheduled for 12/18/2017 at 1 pm with Dr.Jertson. Patient is agreeable to date and time. Encounter closed.

## 2017-12-11 ENCOUNTER — Other Ambulatory Visit: Payer: Self-pay

## 2017-12-11 DIAGNOSIS — R202 Paresthesia of skin: Secondary | ICD-10-CM

## 2017-12-11 DIAGNOSIS — R200 Anesthesia of skin: Secondary | ICD-10-CM

## 2017-12-11 NOTE — Progress Notes (Signed)
Pt following up on [place] appointment for her neuropathy. [facility] and they confirmed receipt of referral. They will be in contact with the patient for an appt.

## 2017-12-17 ENCOUNTER — Telehealth: Payer: Self-pay | Admitting: Obstetrics and Gynecology

## 2017-12-17 NOTE — Telephone Encounter (Signed)
Patient cancelled 2 week recheck appointment for Wednesday due to car problems. Will call back to reschedule.

## 2017-12-18 ENCOUNTER — Ambulatory Visit: Payer: Medicare Other | Admitting: Obstetrics and Gynecology

## 2017-12-27 ENCOUNTER — Encounter: Payer: Self-pay | Admitting: Hematology and Oncology

## 2017-12-27 NOTE — Progress Notes (Signed)
Patient returned call requesting assistance with medical bills and transportation.  Asked patient if she has applied for Medicaid. Patient states she has and was denied last year but has re-applied for 2019 a few weeks ago. Advised patient if approved, this could help with what Medicare leaves her or help with the cost of Medicare. She may also have a deductible. Advised patient that she may apply for the hardship settlement as well. I will give her the application on Monday.   Gave patient the numbers to [employer] [facility] [place] which may help with medical cost as well and [facility] for transportation assistance. Patient states she has reached out to [place] which can help get her home but not from [place] to here.  Discussed the [employer] with the patient as well. Asked patient if she can bring proof of her income on Monday to apply for this grant which may help with gas cards to help her get back and forth if she has someone to bring her. Patient will see me on Monday. She has my contact name and number for any additional financial questions or concerns.

## 2017-12-30 ENCOUNTER — Encounter: Payer: Self-pay | Admitting: Hematology and Oncology

## 2017-12-30 ENCOUNTER — Inpatient Hospital Stay: Payer: Medicare Other | Attending: Hematology and Oncology

## 2017-12-30 ENCOUNTER — Other Ambulatory Visit: Payer: Self-pay

## 2017-12-30 ENCOUNTER — Inpatient Hospital Stay: Payer: Medicare Other

## 2017-12-30 VITALS — BP 160/80 | HR 88 | Temp 97.9°F | Resp 18

## 2017-12-30 DIAGNOSIS — Z9011 Acquired absence of right breast and nipple: Secondary | ICD-10-CM | POA: Diagnosis not present

## 2017-12-30 DIAGNOSIS — Z170 Estrogen receptor positive status [ER+]: Secondary | ICD-10-CM

## 2017-12-30 DIAGNOSIS — I10 Essential (primary) hypertension: Secondary | ICD-10-CM | POA: Diagnosis not present

## 2017-12-30 DIAGNOSIS — C50411 Malignant neoplasm of upper-outer quadrant of right female breast: Secondary | ICD-10-CM

## 2017-12-30 DIAGNOSIS — Z853 Personal history of malignant neoplasm of breast: Secondary | ICD-10-CM | POA: Insufficient documentation

## 2017-12-30 DIAGNOSIS — Z79899 Other long term (current) drug therapy: Secondary | ICD-10-CM | POA: Insufficient documentation

## 2017-12-30 LAB — COMPREHENSIVE METABOLIC PANEL
ALT: 11 U/L (ref 0–55)
AST: 15 U/L (ref 5–34)
Albumin: 4.1 g/dL (ref 3.5–5.0)
Alkaline Phosphatase: 133 U/L (ref 40–150)
Anion gap: 10 (ref 3–11)
BUN: 13 mg/dL (ref 7–26)
CO2: 31 mmol/L — ABNORMAL HIGH (ref 22–29)
Calcium: 10.1 mg/dL (ref 8.4–10.4)
Chloride: 104 mmol/L (ref 98–109)
Creatinine, Ser: 0.96 mg/dL (ref 0.60–1.10)
GFR calc Af Amer: >60 mL/min (ref >60)
GFR calc non Af Amer: >60 mL/min (ref >60)
Glucose, Bld: 92 mg/dL (ref 70–140)
Potassium: 3.1 mmol/L — ABNORMAL LOW (ref 3.5–5.1)
Sodium: 145 mmol/L (ref 136–145)
Total Bilirubin: 0.5 mg/dL (ref 0.2–1.2)
Total Protein: 7.5 g/dL (ref 6.4–8.3)

## 2017-12-30 LAB — CBC WITH DIFFERENTIAL/PLATELET
Basophils Absolute: 0 10*3/uL (ref 0.0–0.1)
Basophils Relative: 0 %
Eosinophils Absolute: 0.1 10*3/uL (ref 0.0–0.5)
Eosinophils Relative: 2 %
HCT: 41.3 % (ref 34.8–46.6)
Hemoglobin: 14.5 g/dL (ref 11.6–15.9)
Lymphocytes Relative: 42 %
Lymphs Abs: 2 10*3/uL (ref 0.9–3.3)
MCH: 31 pg (ref 25.1–34.0)
MCHC: 35.1 g/dL (ref 31.5–36.0)
MCV: 88.4 fL (ref 79.5–101.0)
Monocytes Absolute: 0.3 10*3/uL (ref 0.1–0.9)
Monocytes Relative: 7 %
Neutro Abs: 2.3 10*3/uL (ref 1.5–6.5)
Neutrophils Relative %: 49 %
Platelets: 228 10*3/uL (ref 145–400)
RBC: 4.67 MIL/uL (ref 3.70–5.45)
RDW: 12.7 % (ref 11.2–14.5)
WBC: 4.8 10*3/uL (ref 3.9–10.3)

## 2017-12-30 MED ORDER — GOSERELIN ACETATE 3.6 MG ~~LOC~~ IMPL
3.6000 mg | DRUG_IMPLANT | Freq: Once | SUBCUTANEOUS | Status: AC
  Administered 2017-12-30: 3.6 mg via SUBCUTANEOUS

## 2017-12-30 NOTE — Progress Notes (Signed)
Met with patient whom brought her proof of income for the [employer].  Patient approved for one-time $1000 grant to assist with transportation and other personal expenses while going through treatment. Patient has a copy of the approval letter as well as the expense sheet it covers along with the Outpatient pharmacy information. Patient was given my card for any additional financial questions or concerns.

## 2017-12-30 NOTE — Progress Notes (Signed)
Called patient to advise I would mail her the hardship financial assistance application. Confirmed mailing address.   Placed in outer-office mail.

## 2018-01-02 ENCOUNTER — Ambulatory Visit: Payer: Medicare Other | Admitting: Cardiovascular Disease

## 2018-01-07 ENCOUNTER — Encounter: Payer: Self-pay | Admitting: Hematology and Oncology

## 2018-01-07 NOTE — Progress Notes (Signed)
Patient called states she has not received the hardship application. Placed another one along with my card in the outgoing mail.

## 2018-01-27 ENCOUNTER — Other Ambulatory Visit: Payer: Self-pay | Admitting: Hematology and Oncology

## 2018-01-27 ENCOUNTER — Telehealth: Payer: Self-pay | Admitting: Hematology and Oncology

## 2018-01-27 ENCOUNTER — Telehealth: Payer: Self-pay

## 2018-01-27 ENCOUNTER — Inpatient Hospital Stay: Payer: Medicare Other | Attending: Hematology and Oncology

## 2018-01-27 ENCOUNTER — Inpatient Hospital Stay: Payer: Medicare Other

## 2018-01-27 DIAGNOSIS — Z9011 Acquired absence of right breast and nipple: Secondary | ICD-10-CM | POA: Insufficient documentation

## 2018-01-27 DIAGNOSIS — Z853 Personal history of malignant neoplasm of breast: Secondary | ICD-10-CM | POA: Insufficient documentation

## 2018-01-27 DIAGNOSIS — C50411 Malignant neoplasm of upper-outer quadrant of right female breast: Secondary | ICD-10-CM

## 2018-01-27 DIAGNOSIS — Z170 Estrogen receptor positive status [ER+]: Secondary | ICD-10-CM

## 2018-01-27 DIAGNOSIS — Z79899 Other long term (current) drug therapy: Secondary | ICD-10-CM | POA: Insufficient documentation

## 2018-01-27 LAB — CBC WITH DIFFERENTIAL/PLATELET
Basophils Absolute: 0 K/uL (ref 0.0–0.1)
Basophils Relative: 0 %
Eosinophils Absolute: 0.1 K/uL (ref 0.0–0.5)
Eosinophils Relative: 1 %
HCT: 39.1 % (ref 34.8–46.6)
Hemoglobin: 13.5 g/dL (ref 11.6–15.9)
Lymphocytes Relative: 33 %
Lymphs Abs: 2 K/uL (ref 0.9–3.3)
MCH: 31 pg (ref 25.1–34.0)
MCHC: 34.5 g/dL (ref 31.5–36.0)
MCV: 89.7 fL (ref 79.5–101.0)
Monocytes Absolute: 0.4 K/uL (ref 0.1–0.9)
Monocytes Relative: 7 %
Neutro Abs: 3.5 K/uL (ref 1.5–6.5)
Neutrophils Relative %: 59 %
Platelets: 225 K/uL (ref 145–400)
RBC: 4.36 MIL/uL (ref 3.70–5.45)
RDW: 12.7 % (ref 11.2–14.5)
WBC: 5.9 K/uL (ref 3.9–10.3)

## 2018-01-27 LAB — COMPREHENSIVE METABOLIC PANEL
ALT: 12 U/L (ref 0–55)
AST: 15 U/L (ref 5–34)
Albumin: 4 g/dL (ref 3.5–5.0)
Alkaline Phosphatase: 123 U/L (ref 40–150)
Anion gap: 9 (ref 3–11)
BUN: 14 mg/dL (ref 7–26)
CO2: 29 mmol/L (ref 22–29)
Calcium: 10 mg/dL (ref 8.4–10.4)
Chloride: 106 mmol/L (ref 98–109)
Creatinine, Ser: 0.97 mg/dL (ref 0.60–1.10)
GFR calc Af Amer: >60 mL/min (ref >60)
GFR calc non Af Amer: >60 mL/min (ref >60)
Glucose, Bld: 90 mg/dL (ref 70–140)
Potassium: 2.9 mmol/L — CL (ref 3.5–5.1)
Sodium: 144 mmol/L (ref 136–145)
Total Bilirubin: 0.6 mg/dL (ref 0.2–1.2)
Total Protein: 7.2 g/dL (ref 6.4–8.3)

## 2018-01-27 MED ORDER — POTASSIUM CHLORIDE CRYS ER 20 MEQ PO TBCR: 20.0000 meq | EXTENDED_RELEASE_TABLET | Freq: Two times a day (BID) | ORAL | 3 refills | Status: DC

## 2018-01-27 MED ORDER — GOSERELIN ACETATE 3.6 MG ~~LOC~~ IMPL
3.6000 mg | DRUG_IMPLANT | Freq: Once | SUBCUTANEOUS | Status: AC
  Administered 2018-01-27: 3.6 mg via SUBCUTANEOUS

## 2018-01-27 MED ORDER — GOSERELIN ACETATE 3.6 MG ~~LOC~~ IMPL
DRUG_IMPLANT | SUBCUTANEOUS | Status: AC
  Filled 2018-01-27: qty 3.6

## 2018-01-27 NOTE — Telephone Encounter (Signed)
Called the patient with a potassium of 2.9. Shes not having diarrhea. Sent a prescription for Potassium 20 MEq daily

## 2018-01-27 NOTE — Telephone Encounter (Signed)
Received call from lab,  panic level potassium 2.9.  Notified Dr. Lindi Adie.

## 2018-01-27 NOTE — Patient Instructions (Signed)
Goserelin injection What is this medicine? GOSERELIN (GOE se rel in) is similar to a hormone found in the body. It lowers the amount of sex hormones that the body makes. Men will have lower testosterone levels and women will have lower estrogen levels while taking this medicine. In men, this medicine is used to treat prostate cancer; the injection is either given once per month or once every 12 weeks. A once per month injection (only) is used to treat women with endometriosis, dysfunctional uterine bleeding, or advanced breast cancer. This medicine may be used for other purposes; ask your health care provider or pharmacist if you have questions. COMMON BRAND NAME(S): Zoladex What should I tell my health care provider before I take this medicine? They need to know if you have any of these conditions (some only apply to women): -diabetes -heart disease or previous heart attack -high blood pressure -high cholesterol -kidney disease -osteoporosis or low bone density -problems passing urine -spinal cord injury -stroke -tobacco smoker -an unusual or allergic reaction to goserelin, hormone therapy, other medicines, foods, dyes, or preservatives -pregnant or trying to get pregnant -breast-feeding How should I use this medicine? This medicine is for injection under the skin. It is given by a health care professional in a hospital or clinic setting. Men receive this injection once every 4 weeks or once every 12 weeks. Women will only receive the once every 4 weeks injection. Talk to your pediatrician regarding the use of this medicine in children. Special care may be needed. Overdosage: If you think you have taken too much of this medicine contact a poison control center or emergency room at once. NOTE: This medicine is only for you. Do not share this medicine with others. What if I miss a dose? It is important not to miss your dose. Call your doctor or health care professional if you are unable to  keep an appointment. What may interact with this medicine? -female hormones like estrogen -herbal or dietary supplements like black cohosh, chasteberry, or DHEA -female hormones like testosterone -prasterone This list may not describe all possible interactions. Give your health care provider a list of all the medicines, herbs, non-prescription drugs, or dietary supplements you use. Also tell them if you smoke, drink alcohol, or use illegal drugs. Some items may interact with your medicine. What should I watch for while using this medicine? Visit your doctor or health care professional for regular checks on your progress. Your symptoms may appear to get worse during the first weeks of this therapy. Tell your doctor or healthcare professional if your symptoms do not start to get better or if they get worse after this time. Your bones may get weaker if you take this medicine for a long time. If you smoke or frequently drink alcohol you may increase your risk of bone loss. A family history of osteoporosis, chronic use of drugs for seizures (convulsions), or corticosteroids can also increase your risk of bone loss. Talk to your doctor about how to keep your bones strong. This medicine should stop regular monthly menstration in women. Tell your doctor if you continue to menstrate. Women should not become pregnant while taking this medicine or for 12 weeks after stopping this medicine. Women should inform their doctor if they wish to become pregnant or think they might be pregnant. There is a potential for serious side effects to an unborn child. Talk to your health care professional or pharmacist for more information. Do not breast-feed an infant while taking   this medicine. Men should inform their doctors if they wish to father a child. This medicine may lower sperm counts. Talk to your health care professional or pharmacist for more information. What side effects may I notice from receiving this  medicine? Side effects that you should report to your doctor or health care professional as soon as possible: -allergic reactions like skin rash, itching or hives, swelling of the face, lips, or tongue -bone pain -breathing problems -changes in vision -chest pain -feeling faint or lightheaded, falls -fever, chills -pain, swelling, warmth in the leg -pain, tingling, numbness in the hands or feet -signs and symptoms of low blood pressure like dizziness; feeling faint or lightheaded, falls; unusually weak or tired -stomach pain -swelling of the ankles, feet, hands -trouble passing urine or change in the amount of urine -unusually high or low blood pressure -unusually weak or tired Side effects that usually do not require medical attention (report to your doctor or health care professional if they continue or are bothersome): -change in sex drive or performance -changes in breast size in both males and females -changes in emotions or moods -headache -hot flashes -irritation at site where injected -loss of appetite -skin problems like acne, dry skin -vaginal dryness This list may not describe all possible side effects. Call your doctor for medical advice about side effects. You may report side effects to FDA at 1-800-FDA-1088. Where should I keep my medicine? This drug is given in a hospital or clinic and will not be stored at home. NOTE: This sheet is a summary. It may not cover all possible information. If you have questions about this medicine, talk to your doctor, pharmacist, or health care provider.  2018 Elsevier/Gold Standard (2013-12-29 11:10:35)  

## 2018-01-29 ENCOUNTER — Ambulatory Visit: Payer: Medicare Other | Admitting: Neurology

## 2018-02-03 ENCOUNTER — Telehealth: Payer: Self-pay | Admitting: Neurology

## 2018-02-03 ENCOUNTER — Encounter: Payer: Self-pay | Admitting: Neurology

## 2018-02-03 ENCOUNTER — Ambulatory Visit (INDEPENDENT_AMBULATORY_CARE_PROVIDER_SITE_OTHER): Payer: Medicare Other | Admitting: Neurology

## 2018-02-03 VITALS — BP 150/75 | HR 96 | Ht 65.0 in | Wt 139.0 lb

## 2018-02-03 DIAGNOSIS — R202 Paresthesia of skin: Secondary | ICD-10-CM

## 2018-02-03 DIAGNOSIS — R5382 Chronic fatigue, unspecified: Secondary | ICD-10-CM | POA: Diagnosis not present

## 2018-02-03 DIAGNOSIS — R4701 Aphasia: Secondary | ICD-10-CM

## 2018-02-03 DIAGNOSIS — R29898 Other symptoms and signs involving the musculoskeletal system: Secondary | ICD-10-CM | POA: Diagnosis not present

## 2018-02-03 DIAGNOSIS — R200 Anesthesia of skin: Secondary | ICD-10-CM | POA: Diagnosis not present

## 2018-02-03 DIAGNOSIS — M79641 Pain in right hand: Secondary | ICD-10-CM | POA: Diagnosis not present

## 2018-02-03 NOTE — Patient Instructions (Signed)
Mri brain Emg/ncs Electromyoneurogram Electromyoneurogram is a test to check how well your muscles and nerves are working. This procedure includes the combined use of electromyogram (EMG) and nerve conduction study (NCS). EMG is used to look for muscular disorders. NCS, which is also called electroneurogram, measures how well your nerves are controlling your muscles. The procedures are usually performed together to check if your muscles and nerves are healthy. If the reaction to testing is abnormal, this can indicate disease or injury, such as peripheral nerve damage. Tell a health care provider about:  Any allergies you have.  All medicines you are taking, including vitamins, herbs, eye drops, creams, and over-the-counter medicines.  Any problems you or family members have had with anesthetic medicines.  Any blood disorders you have.  Any surgeries you have had.  Any medical conditions you have.  Any pacemaker you have. What are the risks? Generally, this is a safe procedure. However, problems may occur, including:  Infection where the electrodes were inserted.  Bleeding.  What happens before the procedure?  Ask your health care provider about: ? Changing or stopping your regular medicines. This is especially important if you are taking diabetes medicines or blood thinners. ? Taking medicines such as aspirin and ibuprofen. These medicines can thin your blood. Do not take these medicines before your procedure if your health care provider instructs you not to.  Your health care provider may ask you to avoid: ? Caffeine, such as coffee and tea. ? Nicotine. This includes cigarettes and anything with tobacco.  Do not use lotions or creams on the same day that you will be having the procedure. What happens during the procedure? For EMG:  Your health care provider will ask you to stay in a position so that he or she can access the muscle that will be studied. You may be standing,  sitting down, or lying down.  You may be given a medicine that numbs the area (local anesthetic).  A very thin needle that has an electrode on it will be inserted into your muscle.  Another small electrode will be placed on your skin near the muscle.  Your health care provider will ask you to continue to remain still.  The electrodes will send a signal that tells about the electrical activity of your muscles. You may see this on a monitor or hear it in the room.  After your muscles have been studied at rest, your health care provider will ask you to contract or flex your muscles. The electrodes will send a signal that tells about the electrical activity of your muscles.  Your health care provider will remove the electrodes and the electrode needles when the procedure is finished. The procedure may vary among health care providers and hospitals. For NCS:  An electrode that records your nerve activity (recording electrode) will be placed on your skin by the muscle that is being studied.  An electrode that is used as a reference (reference electrode) will be placed near the recording electrode.  A paste or gel will be applied to your skin between the recording electrode and the reference electrode.  Your nerve will be stimulated with a mild shock. Your health care provider will measure how much time it takes for your muscle to react.  Your health care provider will remove the electrodes and the gel when the procedure is finished. The procedure may vary among health care providers and hospitals. What happens after the procedure?  It is your responsibility to  obtain your test results. Ask your health care provider or the department performing the test when and how you will get your results.  Your health care provider may: ? Give you medicines for any pain. ? Monitor the insertion sites to make sure that they stop bleeding. This information is not intended to replace advice given to you  by your health care provider. Make sure you discuss any questions you have with your health care provider. Document Released: 02/22/2005 Document Revised: 03/29/2016 Document Reviewed: 12/13/2014 Elsevier Interactive Patient Education  2018 [employer].

## 2018-02-03 NOTE — Progress Notes (Signed)
[facility]    Provider:  Dr Jaynee Eagles Referring Provider: Nicholas Lose, MD Primary Care Physician:  Nicholas Lose, MD  CC:  Numbness in hands and pain in wrists  HPI:  Misty Arnold is a 47 y.o. female here as a referral from Dr. Lindi Adie for numbness in fingers.  Past medical history hypertension, breast cancer, depression.  5 months ago started in the right hand digits 1-3, weakness and pain and tingling, no feeling, painful if she tris to make a first. Prednisone did not help. She is taking femora and zolodex, since then her bones have been hurting and pain in the hands. It is not painful, discomfort, can't open a jar and doorknobs. She has some neck pai ntingling on the left side of the neck. When she gets up in the morning it is worse, stiffness and numbness. 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 Worsening, progressive. Braces make it a little better. Fingers are swelling and joints. She had a large lymph node in the axilla and maybe that affected it as well. She is also having some word-finding difficulties, the words don;t come out, slurred speech. She started notciing it 5 months ago about the same time as the hands. Also restless legs. No other focal neurologic deficits, associated symptoms, inciting events or modifiable factors.  Reviewed notes, labs and imaging from outside physicians, which showed:  Reviewed report of nuclear medicine PET imaging skull base to thigh January 2016:  CBC normal, CMP was normal except for hypokalemia January 27, 2018  IMPRESSION: 1. Right axillary node dissection and right mastectomy with breast reconstruction. Recurrent disease superficial to the implant, as detailed above. 2. No evidence of hypermetabolic distant metastasis. 3. Small right axillary node demonstrates low level, non malignant range activity and is favored to be physiologic/reactive. 4. Left sphenoid sinusitis.   Review of  Systems: Patient complains of symptoms per HPI as well as the following symptoms: numbness, weakness. Pertinent negatives and positives per HPI. All others negative.   Social History   Socioeconomic History  . Marital status: Divorced    Spouse name: Not on file  . Number of children: Not on file  . Years of education: 50  . Highest education level: Not on file  Occupational History  . Not on file  Social Needs  . Financial resource strain: Not on file  . Food insecurity:    Worry: Not on file    Inability: Not on file  . Transportation needs:    Medical: Not on file    Non-medical: Not on file  Tobacco Use  . Smoking status: Never Smoker  . Smokeless tobacco: Never Used  Substance and Sexual Activity  . Alcohol use: No  . Drug use: No  . Sexual activity: Yes    Partners: Male    Birth control/protection: Surgical, Condom  Lifestyle  . Physical activity:    Days per week: Not on file    Minutes per session: Not on file  . Stress: Not on file  Relationships  . Social connections:    Talks on phone: Not on file    Gets together: Not on file    Attends religious service: Not on file    Active member of club or organization: Not on file    Attends meetings of clubs or organizations: Not on file    Relationship status: Not on file  . Intimate partner violence:    Fear of current  or ex partner: Not on file    Emotionally abused: Not on file    Physically abused: Not on file    Forced sexual activity: Not on file  Other Topics Concern  . Not on file  Social History Narrative   Lives at home with her grandmother   Right handed   Caffeine seldom, coffee once or twice monthly    Family History  Problem Relation Age of Onset  . Cancer Maternal Aunt        breast  . Stroke Maternal Aunt   . Heart attack Maternal Aunt   . Cancer Maternal Grandfather   . Colon cancer Maternal Grandfather   . Stroke Maternal Grandfather   . Cancer Paternal Grandfather   . Colon  cancer Paternal Grandfather   . Stroke Mother   . Heart attack Mother   . Diabetes Mother   . Hyperlipidemia Mother   . Heart disease Mother   . Diabetes Father   . Stroke Father   . Stroke Paternal Grandmother   . Heart attack Paternal Grandmother   . Heart failure Paternal Grandmother   . Diabetes Paternal Grandmother   . Hyperlipidemia Paternal Grandmother   . CAD Paternal Grandmother   . Stroke Maternal Grandmother   . Breast cancer Maternal Aunt     Past Medical History:  Diagnosis Date  . Abnormal uterine bleeding   . Anemia   . Breast cancer ([place])   . Depression   . Fibroid   . Hormone disorder   . Hypertension   . Infertility, female     Past Surgical History:  Procedure Laterality Date  . ABDOMINAL HYSTERECTOMY    . BREAST SURGERY    . MASTECTOMY    . salivary gland stone      Current Outpatient Medications  Medication Sig Dispense Refill  . amLODipine (NORVASC) 10 MG tablet Take 10 mg by mouth daily.     Marland Kitchen Bioflavonoid Products (ESTER C PO) Take by mouth.    Marland Kitchen BLACK CURRANT SEED OIL PO Take by mouth.    . Cholecalciferol (VITAMIN D PO) Take 5,000 Units by mouth 2 (two) times daily.     . Fish Oil OIL by Does not apply route.    . Flaxseed Oil OIL by Does not apply route.    Marland Kitchen goserelin (ZOLADEX) 3.6 MG injection Inject 3.6 mg into the skin every 28 (twenty-eight) days.    Marland Kitchen letrozole (FEMARA) 2.5 MG tablet Take 1 tablet (2.5 mg total) by mouth daily. 60 tablet 0  . lidocaine-prilocaine (EMLA) cream Apply 1 application topically as needed. Apply 1 hr prior to zoladex injection. 30 g 0  . Misc Natural Products (DANDELION ROOT PO) Take by mouth.    . potassium chloride SA (K-DUR,KLOR-CON) 20 MEQ tablet Take 1 tablet (20 mEq total) by mouth 2 (two) times daily. 30 tablet 3  . [place], Absinthium, Oil OIL by Does not apply route.     No current facility-administered medications for this visit.     Allergies as of 02/03/2018 - Review Complete 02/03/2018    Allergen Reaction Noted  . Penicillins Rash 08/11/2015  . Amoxicillin  02/03/2018    Vitals: BP (!) 150/75 (BP Location: Left Arm, Patient Position: Sitting)   Pulse 96   Ht 5\' 5"  (1.651 m)   Wt 139 lb (63 kg)   BMI 23.13 kg/m  Last Weight:  Wt Readings from Last 1 Encounters:  02/03/18 139 lb (63 kg)   Last Height:  Ht Readings from Last 1 Encounters:  02/03/18 5\' 5"  (1.651 m)   Physical exam: Exam: Gen: NAD, conversant, well nourised, obese, well groomed                     CV: RRR, no MRG. No Carotid Bruits. No peripheral edema, warm, nontender Eyes: Conjunctivae clear without exudates or hemorrhage  Neuro: Detailed Neurologic Exam  Speech:    Speech is normal; fluent and spontaneous with normal comprehension.  Cognition:    The patient is oriented to person, place, and time;     recent and remote memory intact;     language fluent;     normal attention, concentration,     fund of knowledge Cranial Nerves:    The pupils are equal, round, and reactive to light.  Attempted funduscopic exam could not visualize.  Visual fields are full to finger confrontation. Extraocular movements are intact. Trigeminal sensation is intact and the muscles of mastication are normal. The face is symmetric. The palate elevates in the midline. Hearing intact. Voice is normal. Shoulder shrug is normal. The tongue has normal motion without fasciculations.   Coordination:    Normal finger to nose and heel to shin. Normal rapid alternating movements.   Gait:    Heel-toe and tandem gait are normal.   Motor Observation:    No asymmetry, no atrophy, and no involuntary movements noted. Tone:    Normal muscle tone.    Posture:    Posture is normal. normal erect    Strength: decreased grip and opponens pollicis weakness right > left, otherwise strength is V/V in the upper and lower limbs.      Sensation: intact to LT     Reflex Exam:  DTR's:    Deep tendon reflexes in the upper and  lower extremities are normal bilaterally.   Toes:    The toes are downgoing bilaterally.   Clonus:    Clonus is absent.      This Assessment/Plan: This is a lovely 47 year old patient here for multiple concerns.  She has a past medical history of breast cancer.  She has had numbness and tingling in the hands, weakness in the hands, word finding difficulties, slurred speech.  Right arm numbness, aphasia, breast cancer, eval for mets with MRI brain w/wo contrast Emg/ncs bilateral uppers. If no CTS, please eval for brachial plexus etiology given previous right axillary node dissection and enlarged nodes currently.   Orders Placed This Encounter  Procedures  . MR BRAIN W WO CONTRAST  . NCV with EMG(electromyography)     Sarina Ill, MD  [facility] [address] [place], [place] [address]  Phone [phone] Fax [phone]

## 2018-02-03 NOTE — Telephone Encounter (Signed)
Medicare order sent to [facility] no auth they will contact the pt to schedule.

## 2018-02-04 ENCOUNTER — Encounter: Payer: Self-pay | Admitting: Neurology

## 2018-02-05 ENCOUNTER — Ambulatory Visit (INDEPENDENT_AMBULATORY_CARE_PROVIDER_SITE_OTHER): Payer: Medicare Other | Admitting: Neurology

## 2018-02-05 DIAGNOSIS — G5603 Carpal tunnel syndrome, bilateral upper limbs: Secondary | ICD-10-CM

## 2018-02-05 DIAGNOSIS — Z0289 Encounter for other administrative examinations: Secondary | ICD-10-CM

## 2018-02-05 DIAGNOSIS — R29898 Other symptoms and signs involving the musculoskeletal system: Secondary | ICD-10-CM | POA: Diagnosis not present

## 2018-02-05 DIAGNOSIS — M79641 Pain in right hand: Secondary | ICD-10-CM | POA: Diagnosis not present

## 2018-02-05 NOTE — Progress Notes (Addendum)
Full Name: Misty Arnold Gender: Female MRN #: [id number] Date of Birth: 09/06/2071    Visit Date: 02/05/18 10:17 Age: 47 Years 82 Months Old Examining Physician: Sarina Ill, MD  Referring Physician: Nicholas Lose, MD  History: Patient with numbness and tingling in the hands, weakness in the hands.Past medical history hypertension, breast cancer, depression.  5 months ago started in the right hand digits 1-3, weakness and pain and tingling, no feeling, painful if she tries to make a first.  Summary: EMG/NCS was performed on the bilateral upper extremities  NCS: The right median APB motor nerve showed prolonged distal onset latency (9.8 ms, N<4.4) and reduced amplitude(0.3 mV, N>4) and and conduction block across the wrist (>50% decrease in amplitude with proximal conduction as compared to the distal amplitudes).    The right Median 2nd Digit orthodromic sensory nerve showed no response  The left  Median 2nd Digit orthodromic sensory nerve showed prolonged distal peak latency (3.4 ms, N<3.4) and reduced amplitude(9 mV, N>10)  The left median/ulnar (palm) comparison nerve showed prolonged distal peak latency (Median Palm, 2.5 ms, N<2.2) and abnormal peak latency difference (Median Palm-Ulnar Palm, 0.7  ms, N<0.4) with a relative median delay.    All remaining nerves (as detailed in the following tables) were within normal limits.  EMG: The right opponens pollicis muscle showed increased spontaneous activity (+4 PSW).  Patient could not tolerate further EMG testing.  All remaining muscles (as detailed in the following tables)  were within normal limits.     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 There is also mild left Carpal Tunnel Syndrome. No suggestion of polyneuropathy or cervical radiculopathy.  Cc: Nicholas Lose, MD    Sarina Ill  M.D.  [facility] [place], [place] [address] Tel: [phone] Fax: [phone]        [facility]    Nerve / Sites Muscle Latency Ref. Amplitude Ref. Rel Amp Segments Distance Velocity Ref. Area    ms ms mV mV %  cm m/s m/s mVms  L Median - APB     Wrist APB 4.2 ?4.4 5.0 ?4.0 100 Wrist - APB 7   18.2     Upper arm APB 7.9  5.0  100 Upper arm - Wrist 20 53 ?49 18.4  R Median - APB     Wrist APB 9.8 ?4.4 0.3 ?4.0 100 Wrist - APB 7   0.4     Upper arm APB NR  NR  NR Upper arm - Wrist 20 NR ?49 NR  R Median - APB (conduction block across the wrist)     Wrist APB 9.5 ?4.4 0.3 ?4.0 100 Wrist - APB 7   2.9     Palm APB 2.3  4.8  1615 Upper arm - Wrist 3  ?49 10.1  L Ulnar - ADM     Wrist ADM 2.4 ?3.3 9.8 ?6.0 100 Wrist - ADM 7   24.3     B.Elbow ADM 5.3  9.2  93.7 B.Elbow - Wrist 17 60 ?49 24.6     A.Elbow ADM 7.0  8.1  88.1 A.Elbow - B.Elbow 11 64 ?49 22.8     Axilla ADM 9.7  7.2  89.5 Axilla - A.Elbow    19.4     Supraclav fossa ADM 11.5  7.3  100 Supraclav fossa - Axilla    20.3  A.Elbow - Wrist 18 40    R Ulnar - ADM     Wrist ADM 2.2 ?3.3 11.3 ?6.0 100 Wrist - ADM 7   28.9     B.Elbow ADM 5.2  10.9  96.1 B.Elbow - Wrist 18 62 ?49 30.2     A.Elbow ADM 6.8  10.6  97.1 A.Elbow - B.Elbow 10 60 ?49 30.2     Axilla ADM 9.5  9.3  88.2 Axilla - A.Elbow 18 66  27.2     Supraclav fossa ADM 11.1  9.1  97.4 Supraclav fossa - Axilla 10 62  26.0         A.Elbow - Wrist                   SNC    Nerve / Sites Rec. Site Peak Lat Ref.  Amp Ref. Segments Distance Peak Diff Ref.    ms ms V V  cm ms ms  L Radial - Anatomical snuff box (Forearm)     Forearm Wrist 2.4 ?2.9 26 ?15 Forearm - Wrist 10    R Radial - Anatomical snuff box (Forearm)     Forearm Wrist 2.1 ?2.9 28 ?15 Forearm - Wrist 10    L Median, Ulnar - Transcarpal comparison     Median Palm Wrist 2.5 ?2.2 29 ?35 Median Palm - Wrist 8       Ulnar Palm Wrist 1.8 ?2.2 13 ?12 Ulnar Palm - Wrist 8           Median Palm - Ulnar Palm  0.7 ?0.4  L Median - Orthodromic (Dig II, Mid palm)     Dig II Wrist 3.4 ?3.4 9 ?10 Dig II - Wrist 13    R Median - Orthodromic (Dig II, Mid palm)     Dig II Wrist NR ?3.4 NR ?10 Dig II - Wrist 13    L Ulnar - Orthodromic, (Dig V, Mid palm)     Dig V Wrist 2.6 ?3.1 9 ?5 Dig V - Wrist 11    R Ulnar - Orthodromic, (Dig V, Mid palm)     Dig V Wrist 2.2 ?3.1 10 ?5 Dig V - Wrist 11    R Lateral antebrachial cutaneous - Forearm (Elbow)     Elbow Forearm 2.6 ?3.0 10 ?10 Elbow - Forearm 12    R Medial antebrachial cutaneous - Forearm (Elbow)     Elbow Forearm 2.5 ?3.2 6 ?5 Elbow - Forearm 12                         F  Wave    Nerve F Lat Ref.   ms ms  L Ulnar - ADM 24.9 ?32.0  R Ulnar - ADM 25.5 ?32.0         EMG full       EMG       EMG Summary Table    Spontaneous MUAP Recruitment  Muscle IA Fib PSW Fasc Other Amp Dur. Poly Pattern  R. Deltoid Normal None None None _______ Normal Normal Normal Normal  R. Triceps brachii Normal None None None _______ Normal Normal Normal Normal  R. Pronator teres Normal None None None _______ Normal Normal Normal Normal  R. Opponens pollicis Normal None 4+ None _______ Could not tolerate Could not tolerate Could not tolerate Could not tolerate  R. First dorsal interosseous Normal None None None _______ Normal Normal Normal Normal  R. Cervical paraspinals (low) Normal None  None None _______ Normal Normal Normal Normal

## 2018-02-05 NOTE — Procedures (Signed)
Full Name: Misty Arnold Gender: Female MRN #: [id number] Date of Birth: July 03, 2071    Visit Date: 02/05/18 10:17 Age: 47 Years 12 Months Old Examining Physician: Sarina Ill, MD  Referring Physician: Nicholas Lose, MD  History: Patient with numbness and tingling in the hands, weakness in the hands.Past medical history hypertension, breast cancer, depression.  5 months ago started in the right hand digits 1-3, weakness and pain and tingling, no feeling, painful if she tries to make a first.  Summary: EMG/NCS was performed on the bilateral upper extremities  NCS: The right median APB motor nerve showed prolonged distal onset latency (9.8 ms, N<4.4) and reduced amplitude(0.3 mV, N>4) and and conduction block across the wrist (>50% decrease in amplitude with proximal conduction as compared to the distal amplitudes).    The right Median 2nd Digit orthodromic sensory nerve showed no response  The left  Median 2nd Digit orthodromic sensory nerve showed prolonged distal peak latency (3.4 ms, N<3.4) and reduced amplitude(9 mV, N>10)  The left median/ulnar (palm) comparison nerve showed prolonged distal peak latency (Median Palm, 2.5 ms, N<2.2) and abnormal peak latency difference (Median Palm-Ulnar Palm, 0.7  ms, N<0.4) with a relative median delay.    All remaining nerves (as detailed in the following tables) were within normal limits.  EMG: The right opponens pollicis muscle showed increased spontaneous activity (+4 PSW).  Patient could not tolerate further EMG testing.  All remaining muscles (as detailed in the following tables)  were within normal limits.     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 There is also mild left Carpal Tunnel Syndrome. No suggestion of polyneuropathy or cervical radiculopathy.  Cc: Nicholas Lose, MD    Sarina Ill  M.D.  [facility] [place], [place] [address] Tel: [phone] Fax: [phone]        [facility]    Nerve / Sites Muscle Latency Ref. Amplitude Ref. Rel Amp Segments Distance Velocity Ref. Area    ms ms mV mV %  cm m/s m/s mVms  L Median - APB     Wrist APB 4.2 ?4.4 5.0 ?4.0 100 Wrist - APB 7   18.2     Upper arm APB 7.9  5.0  100 Upper arm - Wrist 20 53 ?49 18.4  R Median - APB     Wrist APB 9.8 ?4.4 0.3 ?4.0 100 Wrist - APB 7   0.4     Upper arm APB NR  NR  NR Upper arm - Wrist 20 NR ?49 NR  R Median - APB (conduction block across the wrist)     Wrist APB 9.5 ?4.4 0.3 ?4.0 100 Wrist - APB 7   2.9     Palm APB 2.3  4.8  1615 Upper arm - Wrist 3  ?49 10.1  L Ulnar - ADM     Wrist ADM 2.4 ?3.3 9.8 ?6.0 100 Wrist - ADM 7   24.3     B.Elbow ADM 5.3  9.2  93.7 B.Elbow - Wrist 17 60 ?49 24.6     A.Elbow ADM 7.0  8.1  88.1 A.Elbow - B.Elbow 11 64 ?49 22.8     Axilla ADM 9.7  7.2  89.5 Axilla - A.Elbow    19.4     Supraclav fossa ADM 11.5  7.3  100 Supraclav fossa - Axilla    20.3  A.Elbow - Wrist 18 40    R Ulnar - ADM     Wrist ADM 2.2 ?3.3 11.3 ?6.0 100 Wrist - ADM 7   28.9     B.Elbow ADM 5.2  10.9  96.1 B.Elbow - Wrist 18 62 ?49 30.2     A.Elbow ADM 6.8  10.6  97.1 A.Elbow - B.Elbow 10 60 ?49 30.2     Axilla ADM 9.5  9.3  88.2 Axilla - A.Elbow 18 66  27.2     Supraclav fossa ADM 11.1  9.1  97.4 Supraclav fossa - Axilla 10 62  26.0         A.Elbow - Wrist                   SNC    Nerve / Sites Rec. Site Peak Lat Ref.  Amp Ref. Segments Distance Peak Diff Ref.    ms ms V V  cm ms ms  L Radial - Anatomical snuff box (Forearm)     Forearm Wrist 2.4 ?2.9 26 ?15 Forearm - Wrist 10    R Radial - Anatomical snuff box (Forearm)     Forearm Wrist 2.1 ?2.9 28 ?15 Forearm - Wrist 10    L Median, Ulnar - Transcarpal comparison     Median Palm Wrist 2.5 ?2.2 29 ?35 Median Palm - Wrist 8       Ulnar Palm Wrist 1.8 ?2.2 13 ?12 Ulnar Palm - Wrist 8           Median Palm - Ulnar Palm  0.7 ?0.4  L Median - Orthodromic (Dig II, Mid palm)     Dig II Wrist 3.4 ?3.4 9 ?10 Dig II - Wrist 13    R Median - Orthodromic (Dig II, Mid palm)     Dig II Wrist NR ?3.4 NR ?10 Dig II - Wrist 13    L Ulnar - Orthodromic, (Dig V, Mid palm)     Dig V Wrist 2.6 ?3.1 9 ?5 Dig V - Wrist 11    R Ulnar - Orthodromic, (Dig V, Mid palm)     Dig V Wrist 2.2 ?3.1 10 ?5 Dig V - Wrist 11    R Lateral antebrachial cutaneous - Forearm (Elbow)     Elbow Forearm 2.6 ?3.0 10 ?10 Elbow - Forearm 12    R Medial antebrachial cutaneous - Forearm (Elbow)     Elbow Forearm 2.5 ?3.2 6 ?5 Elbow - Forearm 12                         F  Wave    Nerve F Lat Ref.   ms ms  L Ulnar - ADM 24.9 ?32.0  R Ulnar - ADM 25.5 ?32.0         EMG full       EMG       EMG Summary Table    Spontaneous MUAP Recruitment  Muscle IA Fib PSW Fasc Other Amp Dur. Poly Pattern  L. Deltoid Normal None None None _______ Normal Normal Normal Normal  L. Triceps brachii Normal None None None _______ Normal Normal Normal Normal  L. Pronator teres Normal None None None _______ Normal Normal Normal Normal  L. Opponens pollicis Normal None None None _______ Normal Normal Normal Normal  R. Deltoid Normal None None None _______ Normal Normal Normal Normal  R. Triceps brachii Normal None None None _______ Normal Normal Normal Normal  R. Pronator teres  Normal None None None _______ Normal Normal Normal Normal  R. Opponens pollicis Normal None 4+ None _______ Could not tolerate Could not tolerate Could not tolerate Could not tolerate  L. First dorsal interosseous Normal None None None _______ Normal Normal Normal Normal  R. First dorsal interosseous Normal None None None _______ Normal Normal Normal Normal  L. Cervical paraspinals (low) Normal None None None _______ Normal Normal Normal Normal  R. Cervical paraspinals (low) Normal None None None _______ Normal Normal Normal Normal

## 2018-02-05 NOTE — Addendum Note (Signed)
Addended by: Sarina Ill B on: 02/05/2018 01:29 PM   Modules accepted: Orders

## 2018-02-05 NOTE — Progress Notes (Signed)
See procedure note.

## 2018-02-05 NOTE — Progress Notes (Addendum)
Patient is here for follow-up of pain, numbness and tingling in the hands.  EMG nerve conduction study showed severe right carpal tunnel syndrome and mild left carpal tunnel syndrome.  No suggestion of polyneuropathy, radiculopathy or brachial plexopathy.  Discussed this with patient, discussed pathophysiology of carpal tunnel syndrome, also discussed possibilities which include surgical or steroid injections.  Also discussed upcoming MRI of the brain, she has not been call for that she should be called within the next week to evaluate for any brain pathology that could be causing her symptoms of aphasia and cognitive deficits especially given her history of breast cancer need to check for metastasis or strokes or other etiologies.  Patient would like to be seen at [place] for evaluation of options for her carpal tunnel syndrome.  Did discuss that this can lead to permanent hand weakness and sensory changes if not addressed.  A total of 15 minutes was spent face-to-face with this patient. Over half this time was spent on counseling patient on the CTS diagnosis and different diagnostic and therapeutic options,  risks and benefits of management and education.  This is in addition to any time spent performing the EMG nerve conduction study.  Orders Placed This Encounter  Procedures  . Ambulatory referral to Orthopedic Surgery

## 2018-02-15 ENCOUNTER — Inpatient Hospital Stay: Admission: RE | Admit: 2018-02-15 | Payer: Medicare Other | Source: Ambulatory Visit

## 2018-02-20 ENCOUNTER — Ambulatory Visit
Admission: RE | Admit: 2018-02-20 | Discharge: 2018-02-20 | Disposition: A | Discharge status: Home / Self Care | Payer: Medicare Other | Source: Ambulatory Visit | Attending: Neurology | Admitting: Neurology

## 2018-02-20 DIAGNOSIS — R29898 Other symptoms and signs involving the musculoskeletal system: Secondary | ICD-10-CM | POA: Diagnosis not present

## 2018-02-20 DIAGNOSIS — R200 Anesthesia of skin: Secondary | ICD-10-CM

## 2018-02-20 DIAGNOSIS — R4701 Aphasia: Secondary | ICD-10-CM

## 2018-02-20 DIAGNOSIS — R5382 Chronic fatigue, unspecified: Secondary | ICD-10-CM

## 2018-02-20 DIAGNOSIS — M79641 Pain in right hand: Secondary | ICD-10-CM

## 2018-02-20 DIAGNOSIS — R202 Paresthesia of skin: Secondary | ICD-10-CM

## 2018-02-20 MED ORDER — GADOBENATE DIMEGLUMINE 529 MG/ML IV SOLN: 12.0000 mL | Freq: Once | INTRAVENOUS | Status: DC | PRN

## 2018-02-20 MED ORDER — GADOBENATE DIMEGLUMINE 529 MG/ML IV SOLN
12.0000 mL | Freq: Once | INTRAVENOUS | Status: AC | PRN
  Administered 2018-02-20: 12 mL via INTRAVENOUS

## 2018-02-21 ENCOUNTER — Telehealth: Payer: Self-pay | Admitting: Hematology and Oncology

## 2018-02-21 NOTE — Telephone Encounter (Signed)
Patient called to verify appointment

## 2018-02-26 ENCOUNTER — Telehealth: Payer: Self-pay | Admitting: *Deleted

## 2018-02-26 NOTE — Telephone Encounter (Addendum)
Called pt and LVM (ok per DPR) informing her that her MRI of her brain is normal for her age. There were no strokes, masses or any pathology for her symptoms. Also informed her that incidentally we saw some mild fluid behind her right ear, likely due to allergies, sinus problems or mild right eustachian tube dysfunction. RN asked for a call back if the patient has any questions at all. Left office number in message.    ----- Message from Melvenia Beam, MD sent at 02/24/2018 10:00 PM EDT ----- MRI of the brain normal for age, no strokes or masses or any pathology for her symptoms. Incidentally, there is a mild fluid behind the right ear,  likely due to mild right eustachian tube dysfunction and sinus problems or allergies. thanks

## 2018-03-03 ENCOUNTER — Inpatient Hospital Stay: Payer: Medicare Other | Attending: Hematology and Oncology

## 2018-03-03 ENCOUNTER — Inpatient Hospital Stay: Payer: Medicare Other

## 2018-03-03 ENCOUNTER — Encounter: Payer: Self-pay | Admitting: Adult Health

## 2018-03-03 ENCOUNTER — Inpatient Hospital Stay (HOSPITAL_BASED_OUTPATIENT_CLINIC_OR_DEPARTMENT_OTHER): Payer: Medicare Other | Admitting: Adult Health

## 2018-03-03 ENCOUNTER — Telehealth: Payer: Self-pay | Admitting: Hematology and Oncology

## 2018-03-03 VITALS — BP 169/92 | HR 89 | Temp 97.6°F | Resp 18 | Ht 65.0 in | Wt 139.4 lb

## 2018-03-03 DIAGNOSIS — C50411 Malignant neoplasm of upper-outer quadrant of right female breast: Secondary | ICD-10-CM | POA: Insufficient documentation

## 2018-03-03 DIAGNOSIS — I10 Essential (primary) hypertension: Secondary | ICD-10-CM | POA: Insufficient documentation

## 2018-03-03 DIAGNOSIS — Z853 Personal history of malignant neoplasm of breast: Secondary | ICD-10-CM

## 2018-03-03 DIAGNOSIS — Z79811 Long term (current) use of aromatase inhibitors: Secondary | ICD-10-CM | POA: Insufficient documentation

## 2018-03-03 DIAGNOSIS — Z9011 Acquired absence of right breast and nipple: Secondary | ICD-10-CM | POA: Diagnosis not present

## 2018-03-03 DIAGNOSIS — F329 Major depressive disorder, single episode, unspecified: Secondary | ICD-10-CM | POA: Insufficient documentation

## 2018-03-03 DIAGNOSIS — Z170 Estrogen receptor positive status [ER+]: Secondary | ICD-10-CM

## 2018-03-03 LAB — CBC WITH DIFFERENTIAL/PLATELET
Basophils Absolute: 0 10*3/uL (ref 0.0–0.1)
Basophils Relative: 1 %
Eosinophils Absolute: 0.1 10*3/uL (ref 0.0–0.5)
Eosinophils Relative: 1 %
HCT: 42.2 % (ref 34.8–46.6)
Hemoglobin: 14.3 g/dL (ref 11.6–15.9)
Lymphocytes Relative: 40 %
Lymphs Abs: 2.3 10*3/uL (ref 0.9–3.3)
MCH: 30.9 pg (ref 25.1–34.0)
MCHC: 33.9 g/dL (ref 31.5–36.0)
MCV: 91.1 fL (ref 79.5–101.0)
Monocytes Absolute: 0.4 10*3/uL (ref 0.1–0.9)
Monocytes Relative: 7 %
Neutro Abs: 2.9 10*3/uL (ref 1.5–6.5)
Neutrophils Relative %: 51 %
Platelets: 224 10*3/uL (ref 145–400)
RBC: 4.63 MIL/uL (ref 3.70–5.45)
RDW: 12.9 % (ref 11.2–14.5)
WBC: 5.7 10*3/uL (ref 3.9–10.3)

## 2018-03-03 LAB — COMPREHENSIVE METABOLIC PANEL
ALT: 15 U/L (ref 0–55)
AST: 16 U/L (ref 5–34)
Albumin: 4.4 g/dL (ref 3.5–5.0)
Alkaline Phosphatase: 140 U/L (ref 40–150)
Anion gap: 7 (ref 3–11)
BUN: 12 mg/dL (ref 7–26)
CO2: 30 mmol/L — ABNORMAL HIGH (ref 22–29)
Calcium: 10.3 mg/dL (ref 8.4–10.4)
Chloride: 107 mmol/L (ref 98–109)
Creatinine, Ser: 0.79 mg/dL (ref 0.60–1.10)
GFR calc Af Amer: >60 mL/min (ref >60)
GFR calc non Af Amer: >60 mL/min (ref >60)
Glucose, Bld: 79 mg/dL (ref 70–140)
Potassium: 3.7 mmol/L (ref 3.5–5.1)
Sodium: 144 mmol/L (ref 136–145)
Total Bilirubin: 0.4 mg/dL (ref 0.2–1.2)
Total Protein: 7.8 g/dL (ref 6.4–8.3)

## 2018-03-03 MED ORDER — GOSERELIN ACETATE 3.6 MG ~~LOC~~ IMPL
3.6000 mg | DRUG_IMPLANT | Freq: Once | SUBCUTANEOUS | Status: AC
  Administered 2018-03-03: 3.6 mg via SUBCUTANEOUS

## 2018-03-03 MED ORDER — GOSERELIN ACETATE 3.6 MG ~~LOC~~ IMPL
DRUG_IMPLANT | SUBCUTANEOUS | Status: AC
  Filled 2018-03-03: qty 3.6

## 2018-03-03 NOTE — Progress Notes (Addendum)
[facility] [facility] Follow up:    Misty Lose, MD Misty Arnold [id number]   DIAGNOSIS: Cancer Staging Breast cancer of upper-outer quadrant of right female breast [facility]) Staging form: Breast, AJCC 7th Edition - Clinical: Stage IIIB (T4b, N0, M0) - Unsigned   SUMMARY OF ONCOLOGIC HISTORY:   Breast cancer of upper-outer quadrant of right female breast ([place])   09/21/2010 Mammogram    right breast linear and segmental pleomorphic calcifications from 4:00 to 6:00 position ultrasound revealed 1.6 cm and 1.1 cm masses      09/27/2010 Initial Biopsy    ultrasound-guided biopsy of all masses showed DCIS grade 2; patient went to cancer treatment centers of [place] for second opinion and delayed therapy      01/26/2011 Surgery    right mastectomy followed by reconstruction: Invasive ductal carcinoma T1 N1 MIC M0 stage IB ER/PR positive HER-2 negative, BRCA negative, Oncotype DX low risk      05/29/2012 Procedure    right chest wall nodule excision done on 06/24/2012 showed metastatic carcinoma margins were positive, but CT scan no metastatic disease, recommended chemotherapy but patient refused also refused reexcision      04/28/2013 Treatment Plan Change    patient went to [place] for alternative treatments and took herbal medications, tonics etc. But she could not afford these trips.      01/08/2014 Breast MRI    right breast multiple enhancing masses within the soft tissues largest 5.6 cm in wall skin surface and the capsule of the silicone prosthesis, contiguous nodules involving in inferomedial breast and tired and subcentimeter nodules across the midline      08/29/2015 Surgery    Right mastectomy: IDC with invol of skin and skin ulceration, breast capsule inv cancer, superior medial margin positive, 1/1 LN positive, grade 3, 14.3 cm, 3.5 cm, ALI, chest wall involv, ER 90-100%, PR 80-90%, HER-2 negative, Ki 67 40-50% T4CN1 (St 3B)      03/05/2016 -  Anti-estrogen oral therapy    Tamoxifen 20 mg daily stopped due to headache and uncontrolled hypertension. Decrease to 10 mg daily 04/03/2016, stopped June 2017 and took an estrogen metabolizer over-the-counter; started Aromasin April 2018 from a [place] physician, Zoladex started on 05/2017 and switched to Letrozole in 09/2017       CURRENT THERAPY: Zoladex and Letrozole  INTERVAL HISTORY: Misty Arnold 47 y.o. female returns for evaluation of her estrogen positive breast cancer.  She is receiving Zoladex and letrozole.  She is having a difficult time with the Letrozole.  She feels old and achy and wants to know if she can take a break.  She says she is also having hot flashes.  Right chest wall with cancer involvement is just about gone per [place].    Patient Active Problem List   Diagnosis Date Noted  . Breast cancer of upper-outer quadrant of right female breast ([place]) 11/02/2014    is allergic to penicillins and amoxicillin.  MEDICAL HISTORY: Past Medical History:  Diagnosis Date  . Abnormal uterine bleeding   . Anemia   . Breast cancer ([place])   . Depression   . Fibroid   . Hormone disorder   . Hypertension   . Infertility, female     SURGICAL HISTORY: Past Surgical History:  Procedure Laterality Date  . ABDOMINAL HYSTERECTOMY    . BREAST SURGERY    . MASTECTOMY    . salivary gland stone      SOCIAL HISTORY: Social History  Socioeconomic History  . Marital status: Divorced    Spouse name: Not on file  . Number of children: Not on file  . Years of education: 67  . Highest education level: Not on file  Occupational History  . Not on file  Social Needs  . Financial resource strain: Not on file  . Food insecurity:    Worry: Not on file    Inability: Not on file  . Transportation needs:    Medical: Not on file    Non-medical: Not on file  Tobacco Use  . Smoking status: Never Smoker  . Smokeless tobacco: Never Used  Substance and Sexual  Activity  . Alcohol use: No  . Drug use: No  . Sexual activity: Yes    Partners: Male    Birth control/protection: Surgical, Condom  Lifestyle  . Physical activity:    Days per week: Not on file    Minutes per session: Not on file  . Stress: Not on file  Relationships  . Social connections:    Talks on phone: Not on file    Gets together: Not on file    Attends religious service: Not on file    Active member of club or organization: Not on file    Attends meetings of clubs or organizations: Not on file    Relationship status: Not on file  . Intimate partner violence:    Fear of current or ex partner: Not on file    Emotionally abused: Not on file    Physically abused: Not on file    Forced sexual activity: Not on file  Other Topics Concern  . Not on file  Social History Narrative   Lives at home with her grandmother   Right handed   Caffeine seldom, coffee once or twice monthly    FAMILY HISTORY: Family History  Problem Relation Age of Onset  . Cancer Maternal Aunt        breast  . Stroke Maternal Aunt   . Heart attack Maternal Aunt   . Cancer Maternal Grandfather   . Colon cancer Maternal Grandfather   . Stroke Maternal Grandfather   . Cancer Paternal Grandfather   . Colon cancer Paternal Grandfather   . Stroke Mother   . Heart attack Mother   . Diabetes Mother   . Hyperlipidemia Mother   . Heart disease Mother   . Diabetes Father   . Stroke Father   . Stroke Paternal Grandmother   . Heart attack Paternal Grandmother   . Heart failure Paternal Grandmother   . Diabetes Paternal Grandmother   . Hyperlipidemia Paternal Grandmother   . CAD Paternal Grandmother   . Stroke Maternal Grandmother   . Breast cancer Maternal Aunt     Review of Systems  Constitutional: Negative for appetite change, chills, fatigue and fever.  HENT:   Negative for hearing loss, lump/mass and trouble swallowing.   Eyes: Negative for eye problems and icterus.  Respiratory: Negative  for chest tightness, cough and shortness of breath.   Cardiovascular: Negative for chest pain and leg swelling.  Gastrointestinal: Negative for abdominal distention, abdominal pain, constipation, diarrhea, nausea and vomiting.       Occasional "catch" in RUQ intermittently  Endocrine: Positive for hot flashes.  Musculoskeletal: Positive for arthralgias.  Skin: Negative for itching and rash.  Neurological: Negative for dizziness, headaches and numbness.  Hematological: Negative for adenopathy.  Psychiatric/Behavioral: Negative for depression. The patient is not nervous/anxious.       PHYSICAL  EXAMINATION  ECOG PERFORMANCE STATUS: 1 - Symptomatic but completely ambulatory  Vitals:   03/03/18 1447  BP: (!) 169/92  Pulse: 89  Resp: 18  Temp: 97.6 F (36.4 C)  SpO2: 100%    Physical Exam  Constitutional: She appears well-developed and well-nourished.  HENT:  Head: Normocephalic and atraumatic.  Mouth/Throat: Oropharynx is clear and moist. No oropharyngeal exudate.  Eyes: Pupils are equal, round, and reactive to light. No scleral icterus.  Neck: Neck supple.  Cardiovascular: Normal rate, regular rhythm and normal heart sounds.  Pulmonary/Chest: Effort normal and breath sounds normal. No respiratory distress.  Left chest wall s/p mastectomy, tumors are much improved about 1cm in diameter, as previously was baseball sized, also lesion on right lower rib cage that is around the same size  Abdominal: Soft. Bowel sounds are normal. She exhibits no distension and no mass. There is no tenderness. There is no rebound and no guarding.  Musculoskeletal: Normal range of motion. She exhibits no edema.  Lymphadenopathy:    She has no cervical adenopathy.  Skin: Skin is warm and dry. No rash noted. No erythema.  Psychiatric: She has a normal mood and affect.    LABORATORY DATA:  CBC    Component Value Date/Time   WBC 5.7 03/03/2018 1424   RBC 4.63 03/03/2018 1424   HGB 14.3 03/03/2018  1424   HGB 13.1 11/01/2017 1417   HCT 42.2 03/03/2018 1424   HCT 40.1 11/01/2017 1417   PLT 224 03/03/2018 1424   PLT 227 11/01/2017 1417   MCV 91.1 03/03/2018 1424   MCV 89.3 11/01/2017 1417   MCH 30.9 03/03/2018 1424   MCHC 33.9 03/03/2018 1424   RDW 12.9 03/03/2018 1424   RDW 16.0 (H) 11/01/2017 1417   LYMPHSABS 2.3 03/03/2018 1424   LYMPHSABS 2.5 11/01/2017 1417   MONOABS 0.4 03/03/2018 1424   MONOABS 0.7 11/01/2017 1417   EOSABS 0.1 03/03/2018 1424   EOSABS 0.1 11/01/2017 1417   BASOSABS 0.0 03/03/2018 1424   BASOSABS 0.0 11/01/2017 1417    CMP     Component Value Date/Time   NA 144 03/03/2018 1424   NA 141 11/01/2017 1417   K 3.7 03/03/2018 1424   K 3.5 11/01/2017 1417   CL 107 03/03/2018 1424   CO2 30 (H) 03/03/2018 1424   CO2 26 11/01/2017 1417   GLUCOSE 79 03/03/2018 1424   GLUCOSE 81 11/01/2017 1417   BUN 12 03/03/2018 1424   BUN 15.3 11/01/2017 1417   CREATININE 0.79 03/03/2018 1424   CREATININE 0.8 11/01/2017 1417   CALCIUM 10.3 03/03/2018 1424   CALCIUM 8.9 11/01/2017 1417   PROT 7.8 03/03/2018 1424   PROT 6.5 11/01/2017 1417   ALBUMIN 4.4 03/03/2018 1424   ALBUMIN 3.6 11/01/2017 1417   AST 16 03/03/2018 1424   AST 12 11/01/2017 1417   ALT 15 03/03/2018 1424   ALT 14 11/01/2017 1417   ALKPHOS 140 03/03/2018 1424   ALKPHOS 121 11/01/2017 1417   BILITOT 0.4 03/03/2018 1424   BILITOT 0.23 11/01/2017 1417   GFRNONAA >60 03/03/2018 1424   GFRAA >60 03/03/2018 1424        ASSESSMENT and PLAN:   Breast cancer of upper-outer quadrant of right female breast ([place]) Recurrent right breast cancer initially DCIS treated with mastectomy followed by reconstruction and later developed chest wall recurrence in 2013 ER/PR positive HER-2 negative, patient underwent alternative therapies in [place] but could not afford the trips and hence she has not been on any  breast cancer therapy for a long time.  PET/CT scan 11/12/2014 did not show metastatic disease but  showed extensive involvement in the right breast in an around the implant Stage 3B  Right mastectomy 08/29/2015 at [facility] (Dr.Singh): IDC with invol of skin and skin ulceration, breast capsule inv cancer, superior medial margin positive, 1/1 LN positive, grade 3, 14.3 cm, 3.5 cm, ALI, chest wall involv, ER 90-100%, PR 80-90%, HER-2 negative, Ki 67 40-50% T4CN1 (St 3B)  Treatment plan:(Patient is premenopausal based on blood work done May 2017, estradiol 250, FSH 5.4) 1. Patient started tamoxifen but developed severe headache with severe hypertension and tamoxifen was discontinued 03/17/2016.   -02/2017 started on Exemestane, Zoladex started 05/29/2017, she does have some joint aches and pains (likely the shoulder issues mentioned today)  On Letrozole currently.  Is very achy, but recommended she continue with treatment due to good response.  I reviewed bone health with her and gave her information about bone health in her avs.  She met with Dr. Lindi Adie today and he reviewed her plan.  We will get CT chest, abdomen, and pelvis next month.  She will have this done and then see myself or him in clinic to review results.         Orders Placed This Encounter  Procedures  . CT Abdomen Pelvis W Contrast    Standing Status:   Future    Standing Expiration Date:   03/03/2019    Order Specific Question:   If indicated for the ordered procedure, I authorize the administration of contrast media per Radiology protocol    Answer:   Yes    Order Specific Question:   Is patient pregnant?    Answer:   No    Order Specific Question:   Preferred imaging location?    Answer:   [facility]    Order Specific Question:   Is Oral Contrast requested for this exam?    Answer:   Per Radiology protocol    Order Specific Question:   Radiology Contrast Protocol - do NOT remove file path    Answer:   \\[email or web address]  . CT Chest W Contrast    Standing Status:   Future     Standing Expiration Date:   03/03/2019    Order Specific Question:   If indicated for the ordered procedure, I authorize the administration of contrast media per Radiology protocol    Answer:   Yes    Order Specific Question:   Is patient pregnant?    Answer:   No    Order Specific Question:   Preferred imaging location?    Answer:   [facility]    Order Specific Question:   Radiology Contrast Protocol - do NOT remove file path    Answer:   \\[email or web address]    All questions were answered. The patient knows to call the clinic with any problems, questions or concerns. We can certainly see the patient much sooner if necessary. This note was electronically signed. Scot Dock, NP 03/03/2018   Attending Note  I personally saw and examined [name]. The plan of care was discussed with her. I agree with the assessment and plan as documented above. I discussed with her that we should not give drug holidays at this time because she is responding to treatment. She will continue with monthly Zoladex injections.  The other way to achieve menopause would be to have oophorectomy.  Signed Harriette Ohara, MD

## 2018-03-03 NOTE — Patient Instructions (Signed)
Goserelin injection What is this medicine? GOSERELIN (GOE se rel in) is similar to a hormone found in the body. It lowers the amount of sex hormones that the body makes. Men will have lower testosterone levels and women will have lower estrogen levels while taking this medicine. In men, this medicine is used to treat prostate cancer; the injection is either given once per month or once every 12 weeks. A once per month injection (only) is used to treat women with endometriosis, dysfunctional uterine bleeding, or advanced breast cancer. This medicine may be used for other purposes; ask your health care provider or pharmacist if you have questions. COMMON BRAND NAME(S): Zoladex What should I tell my health care provider before I take this medicine? They need to know if you have any of these conditions (some only apply to women): -diabetes -heart disease or previous heart attack -high blood pressure -high cholesterol -kidney disease -osteoporosis or low bone density -problems passing urine -spinal cord injury -stroke -tobacco smoker -an unusual or allergic reaction to goserelin, hormone therapy, other medicines, foods, dyes, or preservatives -pregnant or trying to get pregnant -breast-feeding How should I use this medicine? This medicine is for injection under the skin. It is given by a health care professional in a hospital or clinic setting. Men receive this injection once every 4 weeks or once every 12 weeks. Women will only receive the once every 4 weeks injection. Talk to your pediatrician regarding the use of this medicine in children. Special care may be needed. Overdosage: If you think you have taken too much of this medicine contact a poison control center or emergency room at once. NOTE: This medicine is only for you. Do not share this medicine with others. What if I miss a dose? It is important not to miss your dose. Call your doctor or health care professional if you are unable to  keep an appointment. What may interact with this medicine? -female hormones like estrogen -herbal or dietary supplements like black cohosh, chasteberry, or DHEA -female hormones like testosterone -prasterone This list may not describe all possible interactions. Give your health care provider a list of all the medicines, herbs, non-prescription drugs, or dietary supplements you use. Also tell them if you smoke, drink alcohol, or use illegal drugs. Some items may interact with your medicine. What should I watch for while using this medicine? Visit your doctor or health care professional for regular checks on your progress. Your symptoms may appear to get worse during the first weeks of this therapy. Tell your doctor or healthcare professional if your symptoms do not start to get better or if they get worse after this time. Your bones may get weaker if you take this medicine for a long time. If you smoke or frequently drink alcohol you may increase your risk of bone loss. A family history of osteoporosis, chronic use of drugs for seizures (convulsions), or corticosteroids can also increase your risk of bone loss. Talk to your doctor about how to keep your bones strong. This medicine should stop regular monthly menstration in women. Tell your doctor if you continue to menstrate. Women should not become pregnant while taking this medicine or for 12 weeks after stopping this medicine. Women should inform their doctor if they wish to become pregnant or think they might be pregnant. There is a potential for serious side effects to an unborn child. Talk to your health care professional or pharmacist for more information. Do not breast-feed an infant while taking   this medicine. Men should inform their doctors if they wish to father a child. This medicine may lower sperm counts. Talk to your health care professional or pharmacist for more information. What side effects may I notice from receiving this  medicine? Side effects that you should report to your doctor or health care professional as soon as possible: -allergic reactions like skin rash, itching or hives, swelling of the face, lips, or tongue -bone pain -breathing problems -changes in vision -chest pain -feeling faint or lightheaded, falls -fever, chills -pain, swelling, warmth in the leg -pain, tingling, numbness in the hands or feet -signs and symptoms of low blood pressure like dizziness; feeling faint or lightheaded, falls; unusually weak or tired -stomach pain -swelling of the ankles, feet, hands -trouble passing urine or change in the amount of urine -unusually high or low blood pressure -unusually weak or tired Side effects that usually do not require medical attention (report to your doctor or health care professional if they continue or are bothersome): -change in sex drive or performance -changes in breast size in both males and females -changes in emotions or moods -headache -hot flashes -irritation at site where injected -loss of appetite -skin problems like acne, dry skin -vaginal dryness This list may not describe all possible side effects. Call your doctor for medical advice about side effects. You may report side effects to FDA at 1-800-FDA-1088. Where should I keep my medicine? This drug is given in a hospital or clinic and will not be stored at home. NOTE: This sheet is a summary. It may not cover all possible information. If you have questions about this medicine, talk to your doctor, pharmacist, or health care provider.  2018 Elsevier/Gold Standard (2013-12-29 11:10:35)  

## 2018-03-03 NOTE — Assessment & Plan Note (Addendum)
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  PET/CT scan 11/12/2014 did not show metastatic disease but showed extensive involvement in the right breast in an around the implant Stage 3B  Right mastectomy 08/29/2015 at [facility] (Dr.Singh): IDC with invol of skin and skin ulceration, breast capsule inv cancer, superior medial margin positive, 1/1 LN positive, grade 3, 14.3 cm, 3.5 cm, ALI, chest wall involv, ER 90-100%, PR 80-90%, HER-2 negative, Ki 67 40-50% T4CN1 (St 3B)  Treatment plan:(Patient is premenopausal based on blood work done May 2017, estradiol 250, FSH 5.4) 1. Patient started tamoxifen but developed severe headache with severe hypertension and tamoxifen was discontinued 03/17/2016.   -02/2017 started on Exemestane, Zoladex started 05/29/2017, she does have some joint aches and pains (likely the shoulder issues mentioned today)  On Letrozole currently.  Is very achy, but recommended she continue with treatment due to good response.  I reviewed bone health with her and gave her information about bone health in her avs.  She met with Dr. Lindi Adie today and he reviewed her plan.  We will get CT chest, abdomen, and pelvis next month.  She will have this done and then see myself or him in clinic to review results.

## 2018-03-03 NOTE — Telephone Encounter (Signed)
Gave patient avs report and appointments for May. Central radiology will call re scan. °

## 2018-03-03 NOTE — Patient Instructions (Signed)
Bone Health Bones protect organs, store calcium, and anchor muscles. Good health habits, such as eating nutritious foods and exercising regularly, are important for maintaining healthy bones. They can also help to prevent a condition that causes bones to lose density and become weak and brittle (osteoporosis). Why is bone mass important? Bone mass refers to the amount of bone tissue that you have. The higher your bone mass, the stronger your bones. An important step toward having healthy bones throughout life is to have strong and dense bones during childhood. A young adult who has a high bone mass is more likely to have a high bone mass later in life. Bone mass at its greatest it is called peak bone mass. A large decline in bone mass occurs in older adults. In women, it occurs about the time of menopause. During this time, it is important to practice good health habits, because if more bone is lost than what is replaced, the bones will become less healthy and more likely to break (fracture). If you find that you have a low bone mass, you may be able to prevent osteoporosis or further bone loss by changing your diet and lifestyle. How can I find out if my bone mass is low? Bone mass can be measured with an X-ray test that is called a bone mineral density (BMD) test. This test is recommended for all women who are age 65 or older. It may also be recommended for men who are age 70 or older, or for people who are more likely to develop osteoporosis due to:  Having bones that break easily.  Having a long-term disease that weakens bones, such as kidney disease or rheumatoid arthritis.  Having menopause earlier than normal.  Taking medicine that weakens bones, such as steroids, thyroid hormones, or hormone treatment for breast cancer or prostate cancer.  Smoking.  Drinking three or more alcoholic drinks each day.  What are the nutritional recommendations for healthy bones? To have healthy bones, you  need to get enough of the right minerals and vitamins. Most nutrition experts recommend getting these nutrients from the foods that you eat. Nutritional recommendations vary from person to person. Ask your health care provider what is healthy for you. Here are some general guidelines. Calcium Recommendations Calcium is the most important (essential) mineral for bone health. Most people can get enough calcium from their diet, but supplements may be recommended for people who are at risk for osteoporosis. Good sources of calcium include:  Dairy products, such as low-fat or nonfat milk, cheese, and yogurt.  Dark green leafy vegetables, such as bok choy and broccoli.  Calcium-fortified foods, such as orange juice, cereal, bread, soy beverages, and tofu products.  Nuts, such as almonds.  Follow these recommended amounts for daily calcium intake:  Children, age 1?3: 700 mg.  Children, age 4?8: 1,000 mg.  Children, age 9?13: 1,300 mg.  Teens, age 14?18: 1,300 mg.  Adults, age 19?50: 1,000 mg.  Adults, age 51?70: ? Men: 1,000 mg. ? Women: 1,200 mg.  Adults, age 71 or older: 1,200 mg.  Pregnant and breastfeeding females: ? Teens: 1,300 mg. ? Adults: 1,000 mg.  Vitamin D Recommendations Vitamin D is the most essential vitamin for bone health. It helps the body to absorb calcium. Sunlight stimulates the skin to make vitamin D, so be sure to get enough sunlight. If you live in a cold climate or you do not get outside often, your health care provider may recommend that you take vitamin   D supplements. Good sources of vitamin D in your diet include:  Egg yolks.  Saltwater fish.  Milk and cereal fortified with vitamin D.  Follow these recommended amounts for daily vitamin D intake:  Children and teens, age 1?18: 600 international units.  Adults, age 50 or younger: 400-800 international units.  Adults, age 51 or older: 800-1,000 international units.  Other Nutrients Other nutrients  for bone health include:  Phosphorus. This mineral is found in meat, poultry, dairy foods, nuts, and legumes. The recommended daily intake for adult men and adult women is 700 mg.  Magnesium. This mineral is found in seeds, nuts, dark green vegetables, and legumes. The recommended daily intake for adult men is 400?420 mg. For adult women, it is 310?320 mg.  Vitamin K. This vitamin is found in green leafy vegetables. The recommended daily intake is 120 mg for adult men and 90 mg for adult women.  What type of physical activity is best for building and maintaining healthy bones? Weight-bearing and strength-building activities are important for building and maintaining peak bone mass. Weight-bearing activities cause muscles and bones to work against gravity. Strength-building activities increases muscle strength that supports bones. Weight-bearing and muscle-building activities include:  Walking and hiking.  Jogging and running.  Dancing.  Gym exercises.  Lifting weights.  Tennis and racquetball.  Climbing stairs.  Aerobics.  Adults should get at least 30 minutes of moderate physical activity on most days. Children should get at least 60 minutes of moderate physical activity on most days. Ask your health care provide what type of exercise is best for you. Where can I find more information? For more information, check out the following websites:  National Osteoporosis Foundation: http://nof.org/learn/basics  National Institutes of Health: http://www.niams.nih.gov/Health_Info/Bone/Bone_Health/bone_health_for_life.asp  This information is not intended to replace advice given to you by your health care provider. Make sure you discuss any questions you have with your health care provider. Document Released: 01/12/2004 Document Revised: 05/11/2016 Document Reviewed: 10/27/2014 Elsevier Interactive Patient Education  2018 Elsevier Inc.  

## 2018-03-10 DIAGNOSIS — C50919 Malignant neoplasm of unspecified site of unspecified female breast: Secondary | ICD-10-CM | POA: Diagnosis not present

## 2018-03-10 DIAGNOSIS — I10 Essential (primary) hypertension: Secondary | ICD-10-CM | POA: Diagnosis not present

## 2018-03-17 ENCOUNTER — Telehealth: Payer: Self-pay | Admitting: Hematology and Oncology

## 2018-03-17 NOTE — Telephone Encounter (Signed)
Returned patients call but was unable to leave message for patient to call office.

## 2018-03-19 ENCOUNTER — Telehealth: Payer: Self-pay | Admitting: Hematology and Oncology

## 2018-03-19 NOTE — Telephone Encounter (Signed)
Patient called to reschedule appt for 5/31 due to [place] Date

## 2018-04-02 ENCOUNTER — Inpatient Hospital Stay: Payer: Medicare Other

## 2018-04-02 ENCOUNTER — Ambulatory Visit (HOSPITAL_COMMUNITY): Payer: Medicare Other

## 2018-04-02 ENCOUNTER — Inpatient Hospital Stay: Payer: Medicare Other | Attending: Hematology and Oncology

## 2018-04-02 MED ORDER — GOSERELIN ACETATE 3.6 MG ~~LOC~~ IMPL
DRUG_IMPLANT | SUBCUTANEOUS | Status: AC
  Filled 2018-04-02: qty 3.6

## 2018-04-04 ENCOUNTER — Ambulatory Visit: Payer: Medicare Other | Admitting: Hematology and Oncology

## 2018-04-07 ENCOUNTER — Ambulatory Visit: Payer: Medicare Other | Admitting: Hematology and Oncology

## 2018-04-09 ENCOUNTER — Inpatient Hospital Stay: Payer: Medicare Other | Attending: Hematology and Oncology

## 2018-04-09 ENCOUNTER — Inpatient Hospital Stay: Payer: Medicare Other

## 2018-04-09 ENCOUNTER — Ambulatory Visit (HOSPITAL_COMMUNITY)
Admission: RE | Admit: 2018-04-09 | Discharge: 2018-04-09 | Disposition: A | Discharge status: Home / Self Care | Payer: Medicare Other | Source: Ambulatory Visit | Attending: Adult Health | Admitting: Adult Health

## 2018-04-09 VITALS — BP 180/95 | HR 77 | Temp 98.8°F | Resp 20

## 2018-04-09 DIAGNOSIS — C50411 Malignant neoplasm of upper-outer quadrant of right female breast: Secondary | ICD-10-CM | POA: Insufficient documentation

## 2018-04-09 DIAGNOSIS — R590 Localized enlarged lymph nodes: Secondary | ICD-10-CM | POA: Diagnosis not present

## 2018-04-09 DIAGNOSIS — R911 Solitary pulmonary nodule: Secondary | ICD-10-CM | POA: Diagnosis not present

## 2018-04-09 DIAGNOSIS — N6321 Unspecified lump in the left breast, upper outer quadrant: Secondary | ICD-10-CM | POA: Diagnosis not present

## 2018-04-09 DIAGNOSIS — Z90710 Acquired absence of both cervix and uterus: Secondary | ICD-10-CM | POA: Insufficient documentation

## 2018-04-09 DIAGNOSIS — Z5111 Encounter for antineoplastic chemotherapy: Secondary | ICD-10-CM | POA: Insufficient documentation

## 2018-04-09 DIAGNOSIS — C7951 Secondary malignant neoplasm of bone: Secondary | ICD-10-CM | POA: Insufficient documentation

## 2018-04-09 DIAGNOSIS — Z170 Estrogen receptor positive status [ER+]: Secondary | ICD-10-CM

## 2018-04-09 DIAGNOSIS — K769 Liver disease, unspecified: Secondary | ICD-10-CM | POA: Insufficient documentation

## 2018-04-09 LAB — COMPREHENSIVE METABOLIC PANEL
ALT: 12 U/L (ref 0–55)
AST: 15 U/L (ref 5–34)
Albumin: 4.4 g/dL (ref 3.5–5.0)
Alkaline Phosphatase: 151 U/L — ABNORMAL HIGH (ref 40–150)
Anion gap: 10 (ref 3–11)
BUN: 14 mg/dL (ref 7–26)
CO2: 29 mmol/L (ref 22–29)
Calcium: 10 mg/dL (ref 8.4–10.4)
Chloride: 102 mmol/L (ref 98–109)
Creatinine, Ser: 0.88 mg/dL (ref 0.60–1.10)
GFR calc Af Amer: >60 mL/min (ref >60)
GFR calc non Af Amer: >60 mL/min (ref >60)
Glucose, Bld: 94 mg/dL (ref 70–140)
Potassium: 3.6 mmol/L (ref 3.5–5.1)
Sodium: 141 mmol/L (ref 136–145)
Total Bilirubin: 0.4 mg/dL (ref 0.2–1.2)
Total Protein: 7.8 g/dL (ref 6.4–8.3)

## 2018-04-09 LAB — CBC WITH DIFFERENTIAL/PLATELET
Basophils Absolute: 0 10*3/uL (ref 0.0–0.1)
Basophils Relative: 1 %
Eosinophils Absolute: 0.1 10*3/uL (ref 0.0–0.5)
Eosinophils Relative: 1 %
HCT: 43.7 % (ref 34.8–46.6)
Hemoglobin: 14.8 g/dL (ref 11.6–15.9)
Lymphocytes Relative: 39 %
Lymphs Abs: 2.4 10*3/uL (ref 0.9–3.3)
MCH: 30.6 pg (ref 25.1–34.0)
MCHC: 33.8 g/dL (ref 31.5–36.0)
MCV: 90.4 fL (ref 79.5–101.0)
Monocytes Absolute: 0.3 10*3/uL (ref 0.1–0.9)
Monocytes Relative: 5 %
Neutro Abs: 3.4 10*3/uL (ref 1.5–6.5)
Neutrophils Relative %: 54 %
Platelets: 253 10*3/uL (ref 145–400)
RBC: 4.83 MIL/uL (ref 3.70–5.45)
RDW: 12.5 % (ref 11.2–14.5)
WBC: 6.2 10*3/uL (ref 3.9–10.3)

## 2018-04-09 MED ORDER — IOPAMIDOL (ISOVUE-300) INJECTION 61%
INTRAVENOUS | Status: AC
  Filled 2018-04-09: qty 100

## 2018-04-09 MED ORDER — GOSERELIN ACETATE 3.6 MG ~~LOC~~ IMPL
3.6000 mg | DRUG_IMPLANT | Freq: Once | SUBCUTANEOUS | Status: AC
  Administered 2018-04-09: 3.6 mg via SUBCUTANEOUS

## 2018-04-09 MED ORDER — IOPAMIDOL (ISOVUE-300) INJECTION 61%
100.0000 mL | Freq: Once | INTRAVENOUS | Status: AC | PRN
  Administered 2018-04-09: 100 mL via INTRAVENOUS

## 2018-04-09 MED ORDER — GOSERELIN ACETATE 3.6 MG ~~LOC~~ IMPL
DRUG_IMPLANT | SUBCUTANEOUS | Status: AC
  Filled 2018-04-09: qty 3.6

## 2018-04-09 NOTE — Patient Instructions (Signed)
Goserelin injection What is this medicine? GOSERELIN (GOE se rel in) is similar to a hormone found in the body. It lowers the amount of sex hormones that the body makes. Men will have lower testosterone levels and women will have lower estrogen levels while taking this medicine. In men, this medicine is used to treat prostate cancer; the injection is either given once per month or once every 12 weeks. A once per month injection (only) is used to treat women with endometriosis, dysfunctional uterine bleeding, or advanced breast cancer. This medicine may be used for other purposes; ask your health care provider or pharmacist if you have questions. COMMON BRAND NAME(S): Zoladex What should I tell my health care provider before I take this medicine? They need to know if you have any of these conditions (some only apply to women): -diabetes -heart disease or previous heart attack -high blood pressure -high cholesterol -kidney disease -osteoporosis or low bone density -problems passing urine -spinal cord injury -stroke -tobacco smoker -an unusual or allergic reaction to goserelin, hormone therapy, other medicines, foods, dyes, or preservatives -pregnant or trying to get pregnant -breast-feeding How should I use this medicine? This medicine is for injection under the skin. It is given by a health care professional in a hospital or clinic setting. Men receive this injection once every 4 weeks or once every 12 weeks. Women will only receive the once every 4 weeks injection. Talk to your pediatrician regarding the use of this medicine in children. Special care may be needed. Overdosage: If you think you have taken too much of this medicine contact a poison control center or emergency room at once. NOTE: This medicine is only for you. Do not share this medicine with others. What if I miss a dose? It is important not to miss your dose. Call your doctor or health care professional if you are unable to  keep an appointment. What may interact with this medicine? -female hormones like estrogen -herbal or dietary supplements like black cohosh, chasteberry, or DHEA -female hormones like testosterone -prasterone This list may not describe all possible interactions. Give your health care provider a list of all the medicines, herbs, non-prescription drugs, or dietary supplements you use. Also tell them if you smoke, drink alcohol, or use illegal drugs. Some items may interact with your medicine. What should I watch for while using this medicine? Visit your doctor or health care professional for regular checks on your progress. Your symptoms may appear to get worse during the first weeks of this therapy. Tell your doctor or healthcare professional if your symptoms do not start to get better or if they get worse after this time. Your bones may get weaker if you take this medicine for a long time. If you smoke or frequently drink alcohol you may increase your risk of bone loss. A family history of osteoporosis, chronic use of drugs for seizures (convulsions), or corticosteroids can also increase your risk of bone loss. Talk to your doctor about how to keep your bones strong. This medicine should stop regular monthly menstration in women. Tell your doctor if you continue to menstrate. Women should not become pregnant while taking this medicine or for 12 weeks after stopping this medicine. Women should inform their doctor if they wish to become pregnant or think they might be pregnant. There is a potential for serious side effects to an unborn child. Talk to your health care professional or pharmacist for more information. Do not breast-feed an infant while taking   this medicine. Men should inform their doctors if they wish to father a child. This medicine may lower sperm counts. Talk to your health care professional or pharmacist for more information. What side effects may I notice from receiving this  medicine? Side effects that you should report to your doctor or health care professional as soon as possible: -allergic reactions like skin rash, itching or hives, swelling of the face, lips, or tongue -bone pain -breathing problems -changes in vision -chest pain -feeling faint or lightheaded, falls -fever, chills -pain, swelling, warmth in the leg -pain, tingling, numbness in the hands or feet -signs and symptoms of low blood pressure like dizziness; feeling faint or lightheaded, falls; unusually weak or tired -stomach pain -swelling of the ankles, feet, hands -trouble passing urine or change in the amount of urine -unusually high or low blood pressure -unusually weak or tired Side effects that usually do not require medical attention (report to your doctor or health care professional if they continue or are bothersome): -change in sex drive or performance -changes in breast size in both males and females -changes in emotions or moods -headache -hot flashes -irritation at site where injected -loss of appetite -skin problems like acne, dry skin -vaginal dryness This list may not describe all possible side effects. Call your doctor for medical advice about side effects. You may report side effects to FDA at 1-800-FDA-1088. Where should I keep my medicine? This drug is given in a hospital or clinic and will not be stored at home. NOTE: This sheet is a summary. It may not cover all possible information. If you have questions about this medicine, talk to your doctor, pharmacist, or health care provider.  2018 Elsevier/Gold Standard (2013-12-29 11:10:35)  

## 2018-04-16 ENCOUNTER — Telehealth: Payer: Self-pay

## 2018-04-16 NOTE — Telephone Encounter (Signed)
Received vm from pt regarding results of her CT scans. Pt would like to know results as soon as its available. Notified Dr.Gudena and aware of CT scan results. Pt will need to come in the office to discuss treatment options. Rescheduled pt original appt 04/22/18 ot 04/17/18 per pt request. Pt confirmed time/date for tomorrow.

## 2018-04-17 ENCOUNTER — Ambulatory Visit: Payer: Medicare Other | Admitting: Hematology and Oncology

## 2018-04-17 ENCOUNTER — Telehealth: Payer: Self-pay | Admitting: Hematology and Oncology

## 2018-04-17 NOTE — Telephone Encounter (Signed)
Returned call to patient.  I notified her of appointment for today and asked her to call if she needed to reschedule per 6/12 phone message

## 2018-04-17 NOTE — Telephone Encounter (Signed)
Patient called back to reschedule appointment back to 6/18 from 6/13. Appointment moved per 6/13 phone call from patient

## 2018-04-22 ENCOUNTER — Inpatient Hospital Stay: Payer: Medicare Other | Admitting: Hematology and Oncology

## 2018-04-22 ENCOUNTER — Ambulatory Visit: Payer: Medicare Other | Admitting: Hematology and Oncology

## 2018-04-22 NOTE — Assessment & Plan Note (Deleted)
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  PET/CT scan 11/12/2014 did not show metastatic disease but showed extensive involvement in the right breast in an around the implant Stage 3B  Right mastectomy 08/29/2015 at [facility] (Dr.Singh): IDC with invol of skin and skin ulceration, breast capsule inv cancer, superior medial margin positive, 1/1 LN positive, grade 3, 14.3 cm, 3.5 cm, ALI, chest wall involv, ER 90-100%, PR 80-90%, HER-2 negative, Ki 67 40-50% T4CN1 (St 3B)  Treatment plan:(Patient is premenopausal based on blood work done May 2017, estradiol 250, FSH 5.4) 1. Patient started tamoxifen but developed severe headache with severe hypertension and tamoxifen was discontinued 03/17/2016.   2. 02/2017 started on Exemestane, Zoladex started 05/29/2017, she does have some joint aches and pains (likely the shoulder issues mentioned today)  CT chest abdomen pelvis 04/10/2018: Enhancing nodules within the right chest wall with locally recurrent disease 2.2 cm, 2.4 cm.  Large enhancing lymph nodes within the right axilla largest 3.8 cm.  Enhancing masses UOQ left breast concerning for breast cancer 1.7 cm and 0.6 cm.  Right lateral seventh rib and manubrial bone lesions, indeterminate attenuation within the liver 8 mm, 6 mm, 5 mm, 1.1 cm indeterminate pulmonary nodule 0.6 cm  I discussed the radiology results with the patient and recommended that we need to consider adding CDK 4 and 6 inhibitors like palbociclib to her treatment.  Ibrance: I discussed the risks and benefits of Ibrance including myelosuppression especially neutropenia and with that risk of infection, there is risk of pulmonary embolism and mild  peripheral neuropathy as well. Fatigue, nausea, diarrhea, decreased appetite as well as alopecia and thrombocytopenia are also potential side effects of Ibrance  With these findings of metastatic disease, we definitely need to get more aggressive with her treatment plan.  Return to clinic in 2 weeks for follow-up and labs on Ibrance.    

## 2018-04-22 NOTE — Progress Notes (Deleted)
Patient Care Team: Nicholas Lose, MD as PCP - General (Hematology and Oncology)  DIAGNOSIS:  Encounter Diagnosis  Name Primary?  . Malignant neoplasm of upper-outer quadrant of right breast in female, estrogen receptor positive ([place])     SUMMARY OF ONCOLOGIC HISTORY:   Breast cancer of upper-outer quadrant of right female breast ([employer])   09/21/2010 Mammogram    right breast linear and segmental pleomorphic calcifications from 4:00 to 6:00 position ultrasound revealed 1.6 cm and 1.1 cm masses      09/27/2010 Initial Biopsy    ultrasound-guided biopsy of all masses showed DCIS grade 2; patient went to cancer treatment centers of [place] for second opinion and delayed therapy      01/26/2011 Surgery    right mastectomy followed by reconstruction: Invasive ductal carcinoma T1 N1 MIC M0 stage IB ER/PR positive HER-2 negative, BRCA negative, Oncotype DX low risk      05/29/2012 Procedure    right chest wall nodule excision done on 06/24/2012 showed metastatic carcinoma margins were positive, but CT scan no metastatic disease, recommended chemotherapy but patient refused also refused reexcision      04/28/2013 Treatment Plan Change    patient went to [place] for alternative treatments and took herbal medications, tonics etc. But she could not afford these trips.      01/08/2014 Breast MRI    right breast multiple enhancing masses within the soft tissues largest 5.6 cm in wall skin surface and the capsule of the silicone prosthesis, contiguous nodules involving in inferomedial breast and tired and subcentimeter nodules across the midline      08/29/2015 Surgery    Right mastectomy: IDC with invol of skin and skin ulceration, breast capsule inv cancer, superior medial margin positive, 1/1 LN positive, grade 3, 14.3 cm, 3.5 cm, ALI, chest wall involv, ER 90-100%, PR 80-90%, HER-2 negative, Ki 67 40-50% T4CN1 (St 3B)      03/05/2016 -  Anti-estrogen oral therapy    Tamoxifen 20 mg daily  stopped due to headache and uncontrolled hypertension. Decrease to 10 mg daily 04/03/2016, stopped June 2017 and took an estrogen metabolizer over-the-counter; started Aromasin April 2018 from a [place] physician, Zoladex started on 05/2017 and switched to Letrozole in 09/2017       CHIEF COMPLIANT: Follow-up on hormone therapy, recent scans  INTERVAL HISTORY: Misty Arnold is a 47 year old premenopausal woman who underwent right mastectomy is currently on oral antiestrogen therapy with letrozole along with Zoladex.  She underwent recent CT scans and is here to discuss results.  She denies any pain or discomfort in the breast.  REVIEW OF SYSTEMS:   Constitutional: Denies fevers, chills or abnormal weight loss Eyes: Denies blurriness of vision Ears, nose, mouth, throat, and face: Denies mucositis or sore throat Respiratory: Denies cough, dyspnea or wheezes Cardiovascular: Denies palpitation, chest discomfort Gastrointestinal:  Denies nausea, heartburn or change in bowel habits Skin: Denies abnormal skin rashes Lymphatics: Denies new lymphadenopathy or easy bruising Neurological:Denies numbness, tingling or new weaknesses Behavioral/Psych: Mood is stable, no new changes  Extremities: No lower extremity edema  All other systems were reviewed with the patient and are negative.  I have reviewed the past medical history, past surgical history, social history and family history with the patient and they are unchanged from previous note.  ALLERGIES:  is allergic to penicillins and amoxicillin.  MEDICATIONS:  Current Outpatient Medications  Medication Sig Dispense Refill  . amLODipine (NORVASC) 10 MG tablet Take 10 mg by mouth daily.     Marland Kitchen  Bioflavonoid Products (ESTER C PO) Take by mouth.    Marland Kitchen BLACK CURRANT SEED OIL PO Take by mouth.    . Cholecalciferol (VITAMIN D PO) Take 5,000 Units by mouth 2 (two) times daily.     . Fish Oil OIL by Does not apply route.    . Flaxseed Oil OIL by Does  not apply route.    Marland Kitchen goserelin (ZOLADEX) 3.6 MG injection Inject 3.6 mg into the skin every 28 (twenty-eight) days.    Marland Kitchen letrozole (FEMARA) 2.5 MG tablet Take 1 tablet (2.5 mg total) by mouth daily. 60 tablet 0  . lidocaine-prilocaine (EMLA) cream Apply 1 application topically as needed. Apply 1 hr prior to zoladex injection. 30 g 0  . Misc Natural Products (DANDELION ROOT PO) Take by mouth.    . potassium chloride SA (K-DUR,KLOR-CON) 20 MEQ tablet Take 1 tablet (20 mEq total) by mouth 2 (two) times daily. 30 tablet 3  . [place], Absinthium, Oil OIL by Does not apply route.     No current facility-administered medications for this visit.     PHYSICAL EXAMINATION: ECOG PERFORMANCE STATUS: 1 - Symptomatic but completely ambulatory  There were no vitals filed for this visit. There were no vitals filed for this visit.  GENERAL:alert, no distress and comfortable SKIN: skin color, texture, turgor are normal, no rashes or significant lesions EYES: normal, Conjunctiva are pink and non-injected, sclera clear OROPHARYNX:no exudate, no erythema and lips, buccal mucosa, and tongue normal  NECK: supple, thyroid normal size, non-tender, without nodularity LYMPH:  no palpable lymphadenopathy in the cervical, axillary or inguinal LUNGS: clear to auscultation and percussion with normal breathing effort HEART: regular rate & rhythm and no murmurs and no lower extremity edema ABDOMEN:abdomen soft, non-tender and normal bowel sounds MUSCULOSKELETAL:no cyanosis of digits and no clubbing  NEURO: alert & oriented x 3 with fluent speech, no focal motor/sensory deficits EXTREMITIES: No lower extremity edema  LABORATORY DATA:  I have reviewed the data as listed CMP Latest Ref Rng & Units 04/09/2018 03/03/2018 01/27/2018  Glucose 70 - 140 mg/dL 94 79 90  BUN 7 - 26 mg/dL _0 Creatinine 0.60 - 1.10 mg/dL 0.88 0.79 0.97  Sodium 136 - 145 mmol/L 141 144 144  Potassium 3.5 - 5.1 mmol/L 3.6 3.7 2.9(LL)    Chloride 98 - 109 mmol/L 102 107 106  CO2 22 - 29 mmol/L 29 30(H) 29  Calcium 8.4 - 10.4 mg/dL 10.0 10.3 10.0  Total Protein 6.4 - 8.3 g/dL 7.8 7.8 7.2  Total Bilirubin 0.2 - 1.2 mg/dL 0.4 0.4 0.6  Alkaline Phos 40 - 150 U/L 151(H) 140 123  AST 5 - 34 U/L _1 ALT 0 - 55 U/L _2 Lab Results  Component Value Date   WBC 6.2 04/09/2018   HGB 14.8 04/09/2018   HCT 43.7 04/09/2018   MCV 90.4 04/09/2018   PLT 253 04/09/2018   NEUTROABS 3.4 04/09/2018    ASSESSMENT & PLAN:  Breast cancer of upper-outer quadrant of right female breast ([place]) 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  PET/CT scan 11/12/2014 did not show metastatic disease but showed extensive involvement in the right breast in an around the implant  Stage 3B  Right mastectomy 08/29/2015 at [facility] (Dr.Singh): IDC with invol of skin and skin ulceration, breast capsule inv cancer, superior medial margin positive, 1/1 LN positive, grade 3, 14.3 cm, 3.5 cm, ALI, chest wall involv, ER 90-100%, PR 80-90%, HER-2 negative, Ki 67 40-50% T4CN1 (St 3B)  Treatment plan:(Patient is premenopausal based on blood work done May 2017, estradiol 250, FSH 5.4) 1. Patient started tamoxifen but developed severe headache with severe hypertension and tamoxifen was discontinued 03/17/2016.   2. 02/2017 started on Exemestane, Zoladex started 05/29/2017, she does have some joint aches and pains (likely the shoulder issues mentioned today)  CT chest abdomen pelvis 04/10/2018: Enhancing nodules within the right chest wall with locally recurrent disease 2.2 cm, 2.4 cm.  Large enhancing lymph nodes within the right axilla largest 3.8 cm.  Enhancing masses UOQ  left breast concerning for breast cancer 1.7 cm and 0.6 cm.  Right lateral seventh rib and manubrial bone lesions, indeterminate attenuation within the liver 8 mm, 6 mm, 5 mm, 1.1 cm indeterminate pulmonary nodule 0.6 cm  I discussed the radiology results with the patient and recommended that we need to consider adding CDK 4 and 6 inhibitors like palbociclib to her treatment.  Ibrance: I discussed the risks and benefits of Ibrance including myelosuppression especially neutropenia and with that risk of infection, there is risk of pulmonary embolism and mild peripheral neuropathy as well. Fatigue, nausea, diarrhea, decreased appetite as well as alopecia and thrombocytopenia are also potential side effects of Ibrance  With these findings of metastatic disease, we definitely need to get more aggressive with her treatment plan.  Return to clinic in 2 weeks for follow-up and labs on Ibrance.      No orders of the defined types were placed in this encounter.  The patient has a good understanding of the overall plan. she agrees with it. she will call with any problems that may develop before the next visit here.   Harriette Ohara, MD 04/22/18

## 2018-04-30 ENCOUNTER — Telehealth: Payer: Self-pay | Admitting: Hematology and Oncology

## 2018-04-30 NOTE — Telephone Encounter (Signed)
Tried to reach patient regarding voicemail °

## 2018-05-05 ENCOUNTER — Inpatient Hospital Stay: Payer: Medicare Other | Attending: Hematology and Oncology | Admitting: Hematology and Oncology

## 2018-05-05 ENCOUNTER — Inpatient Hospital Stay: Payer: Medicare Other

## 2018-05-05 DIAGNOSIS — C50411 Malignant neoplasm of upper-outer quadrant of right female breast: Secondary | ICD-10-CM | POA: Insufficient documentation

## 2018-05-05 DIAGNOSIS — Z9011 Acquired absence of right breast and nipple: Secondary | ICD-10-CM | POA: Insufficient documentation

## 2018-05-05 DIAGNOSIS — Z79818 Long term (current) use of other agents affecting estrogen receptors and estrogen levels: Secondary | ICD-10-CM | POA: Insufficient documentation

## 2018-05-05 DIAGNOSIS — Z170 Estrogen receptor positive status [ER+]: Secondary | ICD-10-CM | POA: Insufficient documentation

## 2018-05-05 MED ORDER — GOSERELIN ACETATE 3.6 MG ~~LOC~~ IMPL
DRUG_IMPLANT | SUBCUTANEOUS | Status: AC
  Filled 2018-05-05: qty 3.6

## 2018-05-09 ENCOUNTER — Telehealth: Payer: Self-pay

## 2018-05-09 DIAGNOSIS — C50411 Malignant neoplasm of upper-outer quadrant of right female breast: Secondary | ICD-10-CM

## 2018-05-09 DIAGNOSIS — Z170 Estrogen receptor positive status [ER+]: Secondary | ICD-10-CM

## 2018-05-09 NOTE — Telephone Encounter (Signed)
Returned pt's call in regards issues in getting ride here, she wants to reschedule her injection appt and office visit with Dr Lindi Adie. Rescheduled appts for 05-14-2018, arrival time 2:30 pm. Pt agreeable with appt time /date. She wanted to know her recent CT results but noted is supposed to discuss with Dr Lindi Adie at office visit, pt agreeable and no other needs per pt at this time.

## 2018-05-14 ENCOUNTER — Encounter: Payer: Self-pay | Admitting: Pharmacist

## 2018-05-14 ENCOUNTER — Inpatient Hospital Stay (HOSPITAL_BASED_OUTPATIENT_CLINIC_OR_DEPARTMENT_OTHER): Payer: Medicare Other | Admitting: Hematology and Oncology

## 2018-05-14 ENCOUNTER — Inpatient Hospital Stay: Payer: Medicare Other

## 2018-05-14 VITALS — BP 191/112 | HR 75 | Temp 98.2°F | Resp 20 | Ht 65.0 in | Wt 140.4 lb

## 2018-05-14 DIAGNOSIS — C50411 Malignant neoplasm of upper-outer quadrant of right female breast: Secondary | ICD-10-CM

## 2018-05-14 DIAGNOSIS — Z170 Estrogen receptor positive status [ER+]: Secondary | ICD-10-CM | POA: Diagnosis not present

## 2018-05-14 DIAGNOSIS — Z79818 Long term (current) use of other agents affecting estrogen receptors and estrogen levels: Secondary | ICD-10-CM | POA: Diagnosis not present

## 2018-05-14 DIAGNOSIS — Z9011 Acquired absence of right breast and nipple: Secondary | ICD-10-CM | POA: Diagnosis not present

## 2018-05-14 LAB — CBC WITH DIFFERENTIAL/PLATELET
Basophils Absolute: 0 10*3/uL (ref 0.0–0.1)
Basophils Relative: 1 %
Eosinophils Absolute: 0 10*3/uL (ref 0.0–0.5)
Eosinophils Relative: 1 %
HCT: 39.9 % (ref 34.8–46.6)
Hemoglobin: 13.7 g/dL (ref 11.6–15.9)
Lymphocytes Relative: 44 %
Lymphs Abs: 2.4 10*3/uL (ref 0.9–3.3)
MCH: 30.6 pg (ref 25.1–34.0)
MCHC: 34.3 g/dL (ref 31.5–36.0)
MCV: 89.1 fL (ref 79.5–101.0)
Monocytes Absolute: 0.4 10*3/uL (ref 0.1–0.9)
Monocytes Relative: 7 %
Neutro Abs: 2.6 10*3/uL (ref 1.5–6.5)
Neutrophils Relative %: 47 %
Platelets: 230 10*3/uL (ref 145–400)
RBC: 4.48 MIL/uL (ref 3.70–5.45)
RDW: 12.6 % (ref 11.2–14.5)
WBC: 5.5 10*3/uL (ref 3.9–10.3)

## 2018-05-14 LAB — COMPREHENSIVE METABOLIC PANEL
ALT: 15 U/L (ref 0–44)
AST: 16 U/L (ref 15–41)
Albumin: 4.3 g/dL (ref 3.5–5.0)
Alkaline Phosphatase: 135 U/L — ABNORMAL HIGH (ref 38–126)
Anion gap: 7 (ref 5–15)
BUN: 13 mg/dL (ref 6–20)
CO2: 27 mmol/L (ref 22–32)
Calcium: 9.6 mg/dL (ref 8.9–10.3)
Chloride: 107 mmol/L (ref 98–111)
Creatinine, Ser: 0.82 mg/dL (ref 0.44–1.00)
GFR calc Af Amer: >60 mL/min (ref >60)
GFR calc non Af Amer: >60 mL/min (ref >60)
Glucose, Bld: 89 mg/dL (ref 70–99)
Potassium: 3.4 mmol/L — ABNORMAL LOW (ref 3.5–5.1)
Sodium: 141 mmol/L (ref 135–145)
Total Bilirubin: 0.5 mg/dL (ref 0.3–1.2)
Total Protein: 7.4 g/dL (ref 6.5–8.1)

## 2018-05-14 MED ORDER — GOSERELIN ACETATE 3.6 MG ~~LOC~~ IMPL
DRUG_IMPLANT | SUBCUTANEOUS | Status: AC
  Filled 2018-05-14: qty 3.6

## 2018-05-14 MED ORDER — GOSERELIN ACETATE 3.6 MG ~~LOC~~ IMPL
3.6000 mg | DRUG_IMPLANT | Freq: Once | SUBCUTANEOUS | Status: AC
  Administered 2018-05-14: 3.6 mg via SUBCUTANEOUS

## 2018-05-14 NOTE — Progress Notes (Signed)
Oral Chemotherapy Pharmacist Encounter  Dispensed samples to patient:  Medication: Femara (letrozole) 2.5mg  tablets Instructions: Take 1 tablet by mouth once daily Quantity dispensed: 60 Days supply: 60 Manufacturer: Nov[employer] Lot: [id number] Exp: 08/04/18   B. Corey Skains, PharmD, BCPS, BCOP

## 2018-05-14 NOTE — Assessment & Plan Note (Signed)
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  PET/CT scan 11/12/2014 did not show metastatic disease but showed extensive involvement in the right breast in an around the implant Stage 3B  Right mastectomy 08/29/2015 at [facility] (Dr.Singh): IDC with invol of skin and skin ulceration, breast capsule inv cancer, superior medial margin positive, 1/1 LN positive, grade 3, 14.3 cm, 3.5 cm, ALI, chest wall involv, ER 90-100%, PR 80-90%, HER-2 negative, Ki 67 40-50% T4CN1 (St 3B)  Treatment plan:(Patient is premenopausal based on blood work done May 2017, estradiol 250, FSH 5.4) 1. Patient started tamoxifen but developed severe headache with severe hypertension and tamoxifen was discontinued 03/17/2016.   2. 02/2017 started on Exemestane, Zoladex started 05/29/2017, she does have some joint aches and pains (likely the shoulder issues mentioned today)  CT chest abdomen pelvis 04/10/2018: Enhancing nodules within the right chest wall with locally recurrent disease 2.2 cm, 2.4 cm.  Large enhancing lymph nodes within the right axilla largest 3.8 cm.  Enhancing masses UOQ left breast concerning for breast cancer 1.7 cm and 0.6 cm.  Right lateral seventh rib and manubrial bone lesions, indeterminate attenuation within the liver 8 mm, 6 mm, 5 mm, 1.1 cm indeterminate pulmonary nodule 0.6 cm  I discussed the radiology results with the patient and recommended that we need to consider adding CDK 4 and 6 inhibitors like palbociclib to her treatment.  Ibrance: I discussed the risks and benefits of Ibrance including myelosuppression especially neutropenia and with that risk of infection, there is risk of pulmonary embolism and mild  peripheral neuropathy as well. Fatigue, nausea, diarrhea, decreased appetite as well as alopecia and thrombocytopenia are also potential side effects of Ibrance  With these findings of metastatic disease, we definitely need to get more aggressive with her treatment plan.  Return to clinic in 2 weeks for follow-up and labs on Ibrance.

## 2018-05-14 NOTE — Patient Instructions (Signed)
Goserelin injection What is this medicine? GOSERELIN (GOE se rel in) is similar to a hormone found in the body. It lowers the amount of sex hormones that the body makes. Men will have lower testosterone levels and women will have lower estrogen levels while taking this medicine. In men, this medicine is used to treat prostate cancer; the injection is either given once per month or once every 12 weeks. A once per month injection (only) is used to treat women with endometriosis, dysfunctional uterine bleeding, or advanced breast cancer. This medicine may be used for other purposes; ask your health care provider or pharmacist if you have questions. COMMON BRAND NAME(S): Zoladex What should I tell my health care provider before I take this medicine? They need to know if you have any of these conditions (some only apply to women): -diabetes -heart disease or previous heart attack -high blood pressure -high cholesterol -kidney disease -osteoporosis or low bone density -problems passing urine -spinal cord injury -stroke -tobacco smoker -an unusual or allergic reaction to goserelin, hormone therapy, other medicines, foods, dyes, or preservatives -pregnant or trying to get pregnant -breast-feeding How should I use this medicine? This medicine is for injection under the skin. It is given by a health care professional in a hospital or clinic setting. Men receive this injection once every 4 weeks or once every 12 weeks. Women will only receive the once every 4 weeks injection. Talk to your pediatrician regarding the use of this medicine in children. Special care may be needed. Overdosage: If you think you have taken too much of this medicine contact a poison control center or emergency room at once. NOTE: This medicine is only for you. Do not share this medicine with others. What if I miss a dose? It is important not to miss your dose. Call your doctor or health care professional if you are unable to  keep an appointment. What may interact with this medicine? -female hormones like estrogen -herbal or dietary supplements like black cohosh, chasteberry, or DHEA -female hormones like testosterone -prasterone This list may not describe all possible interactions. Give your health care provider a list of all the medicines, herbs, non-prescription drugs, or dietary supplements you use. Also tell them if you smoke, drink alcohol, or use illegal drugs. Some items may interact with your medicine. What should I watch for while using this medicine? Visit your doctor or health care professional for regular checks on your progress. Your symptoms may appear to get worse during the first weeks of this therapy. Tell your doctor or healthcare professional if your symptoms do not start to get better or if they get worse after this time. Your bones may get weaker if you take this medicine for a long time. If you smoke or frequently drink alcohol you may increase your risk of bone loss. A family history of osteoporosis, chronic use of drugs for seizures (convulsions), or corticosteroids can also increase your risk of bone loss. Talk to your doctor about how to keep your bones strong. This medicine should stop regular monthly menstration in women. Tell your doctor if you continue to menstrate. Women should not become pregnant while taking this medicine or for 12 weeks after stopping this medicine. Women should inform their doctor if they wish to become pregnant or think they might be pregnant. There is a potential for serious side effects to an unborn child. Talk to your health care professional or pharmacist for more information. Do not breast-feed an infant while taking   this medicine. Men should inform their doctors if they wish to father a child. This medicine may lower sperm counts. Talk to your health care professional or pharmacist for more information. What side effects may I notice from receiving this  medicine? Side effects that you should report to your doctor or health care professional as soon as possible: -allergic reactions like skin rash, itching or hives, swelling of the face, lips, or tongue -bone pain -breathing problems -changes in vision -chest pain -feeling faint or lightheaded, falls -fever, chills -pain, swelling, warmth in the leg -pain, tingling, numbness in the hands or feet -signs and symptoms of low blood pressure like dizziness; feeling faint or lightheaded, falls; unusually weak or tired -stomach pain -swelling of the ankles, feet, hands -trouble passing urine or change in the amount of urine -unusually high or low blood pressure -unusually weak or tired Side effects that usually do not require medical attention (report to your doctor or health care professional if they continue or are bothersome): -change in sex drive or performance -changes in breast size in both males and females -changes in emotions or moods -headache -hot flashes -irritation at site where injected -loss of appetite -skin problems like acne, dry skin -vaginal dryness This list may not describe all possible side effects. Call your doctor for medical advice about side effects. You may report side effects to FDA at 1-800-FDA-1088. Where should I keep my medicine? This drug is given in a hospital or clinic and will not be stored at home. NOTE: This sheet is a summary. It may not cover all possible information. If you have questions about this medicine, talk to your doctor, pharmacist, or health care provider.  2018 Elsevier/Gold Standard (2013-12-29 11:10:35)  

## 2018-05-14 NOTE — Progress Notes (Signed)
Patient Care Team: Nicholas Lose, MD as PCP - General (Hematology and Oncology)  DIAGNOSIS:  Encounter Diagnosis  Name Primary?  . Malignant neoplasm of upper-outer quadrant of right breast in female, estrogen receptor positive ([place]) Yes    SUMMARY OF ONCOLOGIC HISTORY:   Breast cancer of upper-outer quadrant of right female breast ([place])   09/21/2010 Mammogram    right breast linear and segmental pleomorphic calcifications from 4:00 to 6:00 position ultrasound revealed 1.6 cm and 1.1 cm masses      09/27/2010 Initial Biopsy    ultrasound-guided biopsy of all masses showed DCIS grade 2; patient went to cancer treatment centers of [place] for second opinion and delayed therapy      01/26/2011 Surgery    right mastectomy followed by reconstruction: Invasive ductal carcinoma T1 N1 MIC M0 stage IB ER/PR positive HER-2 negative, BRCA negative, Oncotype DX low risk      05/29/2012 Procedure    right chest wall nodule excision done on 06/24/2012 showed metastatic carcinoma margins were positive, but CT scan no metastatic disease, recommended chemotherapy but patient refused also refused reexcision      04/28/2013 Treatment Plan Change    patient went to [place] for alternative treatments and took herbal medications, tonics etc. But she could not afford these trips.      01/08/2014 Breast MRI    right breast multiple enhancing masses within the soft tissues largest 5.6 cm in wall skin surface and the capsule of the silicone prosthesis, contiguous nodules involving in inferomedial breast and tired and subcentimeter nodules across the midline      08/29/2015 Surgery    Right mastectomy: IDC with invol of skin and skin ulceration, breast capsule inv cancer, superior medial margin positive, 1/1 LN positive, grade 3, 14.3 cm, 3.5 cm, ALI, chest wall involv, ER 90-100%, PR 80-90%, HER-2 negative, Ki 67 40-50% T4CN1 (St 3B)      03/05/2016 -  Anti-estrogen oral therapy    Tamoxifen 20 mg daily  stopped due to headache and uncontrolled hypertension. Decrease to 10 mg daily 04/03/2016, stopped June 2017 and took an estrogen metabolizer over-the-counter; started Aromasin April 2018 from a [place] physician, Zoladex started on 05/2017 and switched to Letrozole in 09/2017       CHIEF COMPLIANT: Follow-up to review the results of CT scans done in June  INTERVAL HISTORY: Misty Arnold is a 46-year with above-mentioned history of right breast cancer treated with mastectomy and has had recurrent disease in the chest wall and axilla.  She is currently on antiestrogen therapy with Zoladex along with letrozole.  She does have hot flashes related to the treatment.  She had a CT scan done last month and she could not come because of inability to travel.  She is finally here today and is here to review the results.  The scans showed evidence of progression of disease in the bone.  REVIEW OF SYSTEMS:   Constitutional: Denies fevers, chills or abnormal weight loss Eyes: Denies blurriness of vision Ears, nose, mouth, throat, and face: Denies mucositis or sore throat Respiratory: Denies cough, dyspnea or wheezes Cardiovascular: Denies palpitation, chest discomfort Gastrointestinal:  Denies nausea, heartburn or change in bowel habits Skin: Denies abnormal skin rashes Lymphatics: Denies new lymphadenopathy or easy bruising Neurological:Denies numbness, tingling or new weaknesses Behavioral/Psych: Mood is stable, no new changes  Extremities: No lower extremity edema   All other systems were reviewed with the patient and are negative.  I have reviewed the past medical history,  past surgical history, social history and family history with the patient and they are unchanged from previous note.  ALLERGIES:  is allergic to penicillins and amoxicillin.  MEDICATIONS:  Current Outpatient Medications  Medication Sig Dispense Refill  . amLODipine (NORVASC) 10 MG tablet Take 10 mg by mouth daily.     Marland Kitchen  Bioflavonoid Products (ESTER C PO) Take by mouth.    Marland Kitchen BLACK CURRANT SEED OIL PO Take by mouth.    . Cholecalciferol (VITAMIN D PO) Take 5,000 Units by mouth 2 (two) times daily.     . Fish Oil OIL by Does not apply route.    . Flaxseed Oil OIL by Does not apply route.    Marland Kitchen goserelin (ZOLADEX) 3.6 MG injection Inject 3.6 mg into the skin every 28 (twenty-eight) days.    Marland Kitchen letrozole (FEMARA) 2.5 MG tablet Take 1 tablet (2.5 mg total) by mouth daily. 60 tablet 0  . lidocaine-prilocaine (EMLA) cream Apply 1 application topically as needed. Apply 1 hr prior to zoladex injection. 30 g 0  . Misc Natural Products (DANDELION ROOT PO) Take by mouth.    . potassium chloride SA (K-DUR,KLOR-CON) 20 MEQ tablet Take 1 tablet (20 mEq total) by mouth 2 (two) times daily. 30 tablet 3  . [place], Absinthium, Oil OIL by Does not apply route.     No current facility-administered medications for this visit.     PHYSICAL EXAMINATION: ECOG PERFORMANCE STATUS: 1 - Symptomatic but completely ambulatory  Vitals:   05/14/18 1551  BP: (!) 191/112  Pulse: 75  Resp: 20  Temp: 98.2 F (36.8 C)  SpO2: 100%   Filed Weights   05/14/18 1551  Weight: 140 lb 6.4 oz (63.7 kg)    GENERAL:alert, no distress and comfortable SKIN: skin color, texture, turgor are normal, no rashes or significant lesions EYES: normal, Conjunctiva are pink and non-injected, sclera clear OROPHARYNX:no exudate, no erythema and lips, buccal mucosa, and tongue normal  NECK: supple, thyroid normal size, non-tender, without nodularity LYMPH:  no palpable lymphadenopathy in the cervical, axillary or inguinal LUNGS: clear to auscultation and percussion with normal breathing effort HEART: regular rate & rhythm and no murmurs and no lower extremity edema ABDOMEN:abdomen soft, non-tender and normal bowel sounds MUSCULOSKELETAL:no cyanosis of digits and no clubbing  NEURO: alert & oriented x 3 with fluent speech, no focal motor/sensory  deficits EXTREMITIES: No lower extremity edema   LABORATORY DATA:  I have reviewed the data as listed CMP Latest Ref Rng & Units 05/14/2018 04/09/2018 03/03/2018  Glucose 70 - 99 mg/dL 89 94 79  BUN 6 - 20 mg/dL _0 Creatinine 0.44 - 1.00 mg/dL 0.82 0.88 0.79  Sodium 135 - 145 mmol/L 141 141 144  Potassium 3.5 - 5.1 mmol/L 3.4(L) 3.6 3.7  Chloride 98 - 111 mmol/L 107 102 107  CO2 22 - 32 mmol/L 27 29 30(H)  Calcium 8.9 - 10.3 mg/dL 9.6 10.0 10.3  Total Protein 6.5 - 8.1 g/dL 7.4 7.8 7.8  Total Bilirubin 0.3 - 1.2 mg/dL 0.5 0.4 0.4  Alkaline Phos 38 - 126 U/L 135(H) 151(H) 140  AST 15 - 41 U/L _1 ALT 0 - 44 U/L _2 Lab Results  Component Value Date   WBC 5.5 05/14/2018   HGB 13.7 05/14/2018   HCT 39.9 05/14/2018   MCV 89.1 05/14/2018   PLT 230 05/14/2018   NEUTROABS 2.6 05/14/2018    ASSESSMENT & PLAN:  Breast  cancer of upper-outer quadrant of right female breast ([place]) Recurrent right breast cancer initially DCIS treated with mastectomy followed by reconstruction and later developed chest wall recurrence in 2013 ER/PR positive HER-2 negative, patient underwent alternative therapies in [place] but could not afford the trips and hence she has not been on any breast cancer therapy for a long time.  PET/CT scan 11/12/2014 did not show metastatic disease but showed extensive involvement in the right breast in an around the implant Stage 3B  Right mastectomy 08/29/2015 at [facility] (Dr.Singh): IDC with invol of skin and skin ulceration, breast capsule inv cancer, superior medial margin positive, 1/1 LN positive, grade 3, 14.3 cm, 3.5 cm, ALI, chest wall involv, ER 90-100%, PR 80-90%, HER-2 negative, Ki 67 40-50% T4CN1 (St 3B)  Treatment plan:(Patient is premenopausal based on blood work done May 2017, estradiol 250, FSH 5.4) 1. Patient started tamoxifen but developed severe headache with severe hypertension and tamoxifen was discontinued 03/17/2016.  2. 02/2017  started on Exemestane, Zoladex started 05/29/2017, she does have some joint aches and pains (likely the shoulder issues mentioned today)  CT chest abdomen pelvis 04/10/2018: Enhancing nodules within the right chest wall with locally recurrent disease 2.2 cm, 2.4 cm.  Large enhancing lymph nodes within the right axilla largest 3.8 cm.  Enhancing masses UOQ left breast concerning for breast cancer 1.7 cm and 0.6 cm.  Right lateral seventh rib and manubrial bone lesions, indeterminate attenuation within the liver 8 mm, 6 mm, 5 mm, 1.1 cm indeterminate pulmonary nodule 0.6 cm  I discussed the radiology results with the patient and recommended that we need to consider adding CDK 4 and 6 inhibitors like palbociclib to her treatment.  Ibrance: I discussed the risks and benefits of Ibrance including myelosuppression especially neutropenia and with that risk of infection, there is risk of pulmonary embolism and mild peripheral neuropathy as well. Fatigue, nausea, diarrhea, decreased appetite as well as alopecia and thrombocytopenia are also potential side effects of Ibrance  However after hearing all of these options patient decided that she wants to hold off on changing treatment and she would like to do coffee enemas and if it does not get better than she will consider Ibrance.   We will obtain CT scans in 2 months and follow-up after that.   Orders Placed This Encounter  Procedures  . CT Abdomen Pelvis W Contrast    Standing Status:   Future    Standing Expiration Date:   05/14/2019    Order Specific Question:   ** REASON FOR EXAM (FREE TEXT)    Answer:   Met breast cancer restaging    Order Specific Question:   If indicated for the ordered procedure, I authorize the administration of contrast media per Radiology protocol    Answer:   Yes    Order Specific Question:   Is patient pregnant?    Answer:   No    Order Specific Question:   Preferred imaging location?    Answer:   [facility]     Order Specific Question:   Is Oral Contrast requested for this exam?    Answer:   Yes, Per Radiology protocol    Order Specific Question:   Radiology Contrast Protocol - do NOT remove file path    Answer:   _0 [email or web address]  . CT Chest W Contrast    Standing Status:   Future    Standing Expiration Date:   05/14/2019    Order Specific Question:   **  REASON FOR EXAM (FREE TEXT)    Answer:   Met breast cancer restaging    Order Specific Question:   If indicated for the ordered procedure, I authorize the administration of contrast media per Radiology protocol    Answer:   Yes    Order Specific Question:   Is patient pregnant?    Answer:   No    Order Specific Question:   Preferred imaging location?    Answer:   [facility]    Order Specific Question:   Radiology Contrast Protocol - do NOT remove file path    Answer:   _0 [email or web address]  . Cancer antigen 27.29    Standing Status:   Future    Standing Expiration Date:   06/18/2019   The patient has a good understanding of the overall plan. she agrees with it. she will call with any problems that may develop before the next visit here.   Harriette Ohara, MD 05/14/18

## 2018-05-15 ENCOUNTER — Telehealth: Payer: Self-pay | Admitting: Hematology and Oncology

## 2018-05-15 NOTE — Telephone Encounter (Signed)
Spoke to patient regarding upcoming august and sept appts. Patients appts in sept are scheduled in the same day [place]. VG aware.

## 2018-05-23 ENCOUNTER — Telehealth: Payer: Self-pay

## 2018-05-23 NOTE — Telephone Encounter (Signed)
Returned pt's call regarding she has recently received her CT results from Dr Lindi Adie but would like nurse to call her and go over CT report and let her know where tumors are, explain terms, she would like to write it down.  No answer, left VM with our contact info.

## 2018-06-09 ENCOUNTER — Inpatient Hospital Stay: Payer: Medicare Other | Attending: Hematology and Oncology

## 2018-06-09 DIAGNOSIS — C50411 Malignant neoplasm of upper-outer quadrant of right female breast: Secondary | ICD-10-CM | POA: Insufficient documentation

## 2018-06-09 DIAGNOSIS — Z5111 Encounter for antineoplastic chemotherapy: Secondary | ICD-10-CM | POA: Insufficient documentation

## 2018-06-09 DIAGNOSIS — Z170 Estrogen receptor positive status [ER+]: Secondary | ICD-10-CM | POA: Insufficient documentation

## 2018-06-09 MED ORDER — GOSERELIN ACETATE 3.6 MG ~~LOC~~ IMPL
DRUG_IMPLANT | SUBCUTANEOUS | Status: AC
  Filled 2018-06-09: qty 3.6

## 2018-06-12 ENCOUNTER — Telehealth: Payer: Self-pay | Admitting: Hematology and Oncology

## 2018-06-12 NOTE — Telephone Encounter (Signed)
Patient called to reschedule her appointment °

## 2018-06-16 ENCOUNTER — Inpatient Hospital Stay: Payer: Medicare Other

## 2018-06-16 VITALS — BP 163/102 | HR 76 | Temp 98.3°F | Resp 18

## 2018-06-16 DIAGNOSIS — C50411 Malignant neoplasm of upper-outer quadrant of right female breast: Secondary | ICD-10-CM | POA: Diagnosis not present

## 2018-06-16 DIAGNOSIS — Z170 Estrogen receptor positive status [ER+]: Secondary | ICD-10-CM

## 2018-06-16 DIAGNOSIS — Z5111 Encounter for antineoplastic chemotherapy: Secondary | ICD-10-CM | POA: Diagnosis not present

## 2018-06-16 LAB — CBC WITH DIFFERENTIAL (CANCER CENTER ONLY)
Basophils Absolute: 0 10*3/uL (ref 0.0–0.1)
Basophils Relative: 1 %
Eosinophils Absolute: 0 10*3/uL (ref 0.0–0.5)
Eosinophils Relative: 1 %
HCT: 43.5 % (ref 34.8–46.6)
Hemoglobin: 14.7 g/dL (ref 11.6–15.9)
Lymphocytes Relative: 37 %
Lymphs Abs: 1.9 10*3/uL (ref 0.9–3.3)
MCH: 30.5 pg (ref 25.1–34.0)
MCHC: 33.8 g/dL (ref 31.5–36.0)
MCV: 90.1 fL (ref 79.5–101.0)
Monocytes Absolute: 0.4 10*3/uL (ref 0.1–0.9)
Monocytes Relative: 9 %
Neutro Abs: 2.7 10*3/uL (ref 1.5–6.5)
Neutrophils Relative %: 52 %
Platelet Count: 227 10*3/uL (ref 145–400)
RBC: 4.83 MIL/uL (ref 3.70–5.45)
RDW: 12.8 % (ref 11.2–14.5)
WBC Count: 5 10*3/uL (ref 3.9–10.3)

## 2018-06-16 LAB — CMP (CANCER CENTER ONLY)
ALT: 13 U/L (ref 0–44)
AST: 15 U/L (ref 15–41)
Albumin: 4.3 g/dL (ref 3.5–5.0)
Alkaline Phosphatase: 134 U/L — ABNORMAL HIGH (ref 38–126)
Anion gap: 12 (ref 5–15)
BUN: 14 mg/dL (ref 6–20)
CO2: 27 mmol/L (ref 22–32)
Calcium: 9.7 mg/dL (ref 8.9–10.3)
Chloride: 103 mmol/L (ref 98–111)
Creatinine: 0.87 mg/dL (ref 0.44–1.00)
GFR, Est AFR Am: >60 mL/min (ref >60)
GFR, Estimated: >60 mL/min (ref >60)
Glucose, Bld: 98 mg/dL (ref 70–99)
Potassium: 3.2 mmol/L — ABNORMAL LOW (ref 3.5–5.1)
Sodium: 142 mmol/L (ref 135–145)
Total Bilirubin: 0.6 mg/dL (ref 0.3–1.2)
Total Protein: 7.6 g/dL (ref 6.5–8.1)

## 2018-06-16 MED ORDER — GOSERELIN ACETATE 3.6 MG ~~LOC~~ IMPL
DRUG_IMPLANT | SUBCUTANEOUS | Status: AC
  Filled 2018-06-16: qty 3.6

## 2018-06-16 MED ORDER — GOSERELIN ACETATE 3.6 MG ~~LOC~~ IMPL
3.6000 mg | DRUG_IMPLANT | Freq: Once | SUBCUTANEOUS | Status: AC
  Administered 2018-06-16: 3.6 mg via SUBCUTANEOUS

## 2018-06-16 MED ORDER — GOSERELIN ACETATE 3.6 MG ~~LOC~~ IMPL
DRUG_IMPLANT | SUBCUTANEOUS | Status: AC
Start: 2018-06-16 — End: ?
  Filled 2018-06-16: qty 3.6

## 2018-06-16 NOTE — Patient Instructions (Signed)
Goserelin injection What is this medicine? GOSERELIN (GOE se rel in) is similar to a hormone found in the body. It lowers the amount of sex hormones that the body makes. Men will have lower testosterone levels and women will have lower estrogen levels while taking this medicine. In men, this medicine is used to treat prostate cancer; the injection is either given once per month or once every 12 weeks. A once per month injection (only) is used to treat women with endometriosis, dysfunctional uterine bleeding, or advanced breast cancer. This medicine may be used for other purposes; ask your health care provider or pharmacist if you have questions. COMMON BRAND NAME(S): Zoladex What should I tell my health care provider before I take this medicine? They need to know if you have any of these conditions (some only apply to women): -diabetes -heart disease or previous heart attack -high blood pressure -high cholesterol -kidney disease -osteoporosis or low bone density -problems passing urine -spinal cord injury -stroke -tobacco smoker -an unusual or allergic reaction to goserelin, hormone therapy, other medicines, foods, dyes, or preservatives -pregnant or trying to get pregnant -breast-feeding How should I use this medicine? This medicine is for injection under the skin. It is given by a health care professional in a hospital or clinic setting. Men receive this injection once every 4 weeks or once every 12 weeks. Women will only receive the once every 4 weeks injection. Talk to your pediatrician regarding the use of this medicine in children. Special care may be needed. Overdosage: If you think you have taken too much of this medicine contact a poison control center or emergency room at once. NOTE: This medicine is only for you. Do not share this medicine with others. What if I miss a dose? It is important not to miss your dose. Call your doctor or health care professional if you are unable to  keep an appointment. What may interact with this medicine? -female hormones like estrogen -herbal or dietary supplements like black cohosh, chasteberry, or DHEA -female hormones like testosterone -prasterone This list may not describe all possible interactions. Give your health care provider a list of all the medicines, herbs, non-prescription drugs, or dietary supplements you use. Also tell them if you smoke, drink alcohol, or use illegal drugs. Some items may interact with your medicine. What should I watch for while using this medicine? Visit your doctor or health care professional for regular checks on your progress. Your symptoms may appear to get worse during the first weeks of this therapy. Tell your doctor or healthcare professional if your symptoms do not start to get better or if they get worse after this time. Your bones may get weaker if you take this medicine for a long time. If you smoke or frequently drink alcohol you may increase your risk of bone loss. A family history of osteoporosis, chronic use of drugs for seizures (convulsions), or corticosteroids can also increase your risk of bone loss. Talk to your doctor about how to keep your bones strong. This medicine should stop regular monthly menstration in women. Tell your doctor if you continue to menstrate. Women should not become pregnant while taking this medicine or for 12 weeks after stopping this medicine. Women should inform their doctor if they wish to become pregnant or think they might be pregnant. There is a potential for serious side effects to an unborn child. Talk to your health care professional or pharmacist for more information. Do not breast-feed an infant while taking   this medicine. Men should inform their doctors if they wish to father a child. This medicine may lower sperm counts. Talk to your health care professional or pharmacist for more information. What side effects may I notice from receiving this  medicine? Side effects that you should report to your doctor or health care professional as soon as possible: -allergic reactions like skin rash, itching or hives, swelling of the face, lips, or tongue -bone pain -breathing problems -changes in vision -chest pain -feeling faint or lightheaded, falls -fever, chills -pain, swelling, warmth in the leg -pain, tingling, numbness in the hands or feet -signs and symptoms of low blood pressure like dizziness; feeling faint or lightheaded, falls; unusually weak or tired -stomach pain -swelling of the ankles, feet, hands -trouble passing urine or change in the amount of urine -unusually high or low blood pressure -unusually weak or tired Side effects that usually do not require medical attention (report to your doctor or health care professional if they continue or are bothersome): -change in sex drive or performance -changes in breast size in both males and females -changes in emotions or moods -headache -hot flashes -irritation at site where injected -loss of appetite -skin problems like acne, dry skin -vaginal dryness This list may not describe all possible side effects. Call your doctor for medical advice about side effects. You may report side effects to FDA at 1-800-FDA-1088. Where should I keep my medicine? This drug is given in a hospital or clinic and will not be stored at home. NOTE: This sheet is a summary. It may not cover all possible information. If you have questions about this medicine, talk to your doctor, pharmacist, or health care provider.  2018 Elsevier/Gold Standard (2013-12-29 11:10:35)  

## 2018-06-17 LAB — CANCER ANTIGEN 27.29: CA 27.29: 42.7 U/mL — ABNORMAL HIGH (ref 0.0–38.6)

## 2018-07-08 ENCOUNTER — Ambulatory Visit: Payer: Medicare Other

## 2018-07-08 ENCOUNTER — Ambulatory Visit (HOSPITAL_COMMUNITY): Payer: Medicare Other

## 2018-07-08 ENCOUNTER — Other Ambulatory Visit: Payer: Medicare Other

## 2018-07-08 ENCOUNTER — Ambulatory Visit: Payer: Medicare Other | Admitting: Hematology and Oncology

## 2018-07-11 ENCOUNTER — Other Ambulatory Visit: Payer: Self-pay

## 2018-07-11 DIAGNOSIS — Z170 Estrogen receptor positive status [ER+]: Secondary | ICD-10-CM

## 2018-07-11 DIAGNOSIS — C50411 Malignant neoplasm of upper-outer quadrant of right female breast: Secondary | ICD-10-CM

## 2018-07-14 ENCOUNTER — Inpatient Hospital Stay: Payer: Medicare Other

## 2018-07-14 ENCOUNTER — Inpatient Hospital Stay: Payer: Medicare Other | Attending: Hematology and Oncology

## 2018-07-14 ENCOUNTER — Inpatient Hospital Stay (HOSPITAL_BASED_OUTPATIENT_CLINIC_OR_DEPARTMENT_OTHER): Payer: Medicare Other | Admitting: Hematology and Oncology

## 2018-07-14 ENCOUNTER — Telehealth: Payer: Self-pay | Admitting: Hematology and Oncology

## 2018-07-14 ENCOUNTER — Ambulatory Visit (HOSPITAL_COMMUNITY)
Admission: RE | Admit: 2018-07-14 | Discharge: 2018-07-14 | Disposition: A | Discharge status: Home / Self Care | Payer: Medicare Other | Source: Ambulatory Visit | Attending: Hematology and Oncology | Admitting: Hematology and Oncology

## 2018-07-14 DIAGNOSIS — Z79811 Long term (current) use of aromatase inhibitors: Secondary | ICD-10-CM

## 2018-07-14 DIAGNOSIS — R918 Other nonspecific abnormal finding of lung field: Secondary | ICD-10-CM | POA: Insufficient documentation

## 2018-07-14 DIAGNOSIS — N6320 Unspecified lump in the left breast, unspecified quadrant: Secondary | ICD-10-CM | POA: Insufficient documentation

## 2018-07-14 DIAGNOSIS — Z170 Estrogen receptor positive status [ER+]: Secondary | ICD-10-CM

## 2018-07-14 DIAGNOSIS — C50411 Malignant neoplasm of upper-outer quadrant of right female breast: Secondary | ICD-10-CM

## 2018-07-14 DIAGNOSIS — C773 Secondary and unspecified malignant neoplasm of axilla and upper limb lymph nodes: Secondary | ICD-10-CM | POA: Insufficient documentation

## 2018-07-14 DIAGNOSIS — Z9011 Acquired absence of right breast and nipple: Secondary | ICD-10-CM | POA: Insufficient documentation

## 2018-07-14 DIAGNOSIS — C792 Secondary malignant neoplasm of skin: Secondary | ICD-10-CM

## 2018-07-14 DIAGNOSIS — Z853 Personal history of malignant neoplasm of breast: Secondary | ICD-10-CM | POA: Diagnosis not present

## 2018-07-14 DIAGNOSIS — Z5111 Encounter for antineoplastic chemotherapy: Secondary | ICD-10-CM | POA: Diagnosis not present

## 2018-07-14 MED ORDER — GOSERELIN ACETATE 3.6 MG ~~LOC~~ IMPL
DRUG_IMPLANT | SUBCUTANEOUS | Status: AC
  Filled 2018-07-14: qty 3.6

## 2018-07-14 MED ORDER — IOPAMIDOL (ISOVUE-300) INJECTION 61%
INTRAVENOUS | Status: AC
  Filled 2018-07-14: qty 30

## 2018-07-14 MED ORDER — IOHEXOL 300 MG/ML  SOLN
100.0000 mL | Freq: Once | INTRAMUSCULAR | Status: AC | PRN
  Administered 2018-07-14: 100 mL via INTRAVENOUS

## 2018-07-14 MED ORDER — GOSERELIN ACETATE 3.6 MG ~~LOC~~ IMPL
3.6000 mg | DRUG_IMPLANT | Freq: Once | SUBCUTANEOUS | Status: AC
  Administered 2018-07-14: 3.6 mg via SUBCUTANEOUS

## 2018-07-14 NOTE — Progress Notes (Signed)
Patient Care Team: Nicholas Lose, MD as PCP - General (Hematology and Oncology)  DIAGNOSIS:  Encounter Diagnosis  Name Primary?  . 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; patient went to cancer treatment centers of [place] for second opinion and delayed therapy    01/26/2011 Surgery    right mastectomy followed by reconstruction: Invasive ductal carcinoma T1 N1 MIC M0 stage IB ER/PR positive HER-2 negative, BRCA negative, Oncotype DX low risk    05/29/2012 Procedure    right chest wall nodule excision done on 06/24/2012 showed metastatic carcinoma margins were positive, but CT scan no metastatic disease, recommended chemotherapy but patient refused also refused reexcision    04/28/2013 Treatment Plan Change    patient went to [place] for alternative treatments and took herbal medications, tonics etc. But she could not afford these trips.    01/08/2014 Breast MRI    right breast multiple enhancing masses within the soft tissues largest 5.6 cm in wall skin surface and the capsule of the silicone prosthesis, contiguous nodules involving in inferomedial breast and tired and subcentimeter nodules across the midline    08/29/2015 Surgery    Right mastectomy: IDC with invol of skin and skin ulceration, breast capsule inv cancer, superior medial margin positive, 1/1 LN positive, grade 3, 14.3 cm, 3.5 cm, ALI, chest wall involv, ER 90-100%, PR 80-90%, HER-2 negative, Ki 67 40-50% T4CN1 (St 3B)    03/05/2016 -  Anti-estrogen oral therapy    Tamoxifen 20 mg daily stopped due to headache  and uncontrolled hypertension. 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  She reports to me that she thinks that the tumors are shrinking in size.  She has been doing additional naturopathic/holistic treatments that include coffee enemas and other herbal remedies.  She is also using essential oils.  She thinks that all of these are helping shrink the tumors.  She is scheduled for a CT scan today.  REVIEW OF SYSTEMS:   Constitutional: Denies fevers, chills or abnormal weight loss Eyes: Denies blurriness of vision Ears, nose, mouth, throat, and face: Denies mucositis or sore throat Respiratory: Denies cough, dyspnea or wheezes Cardiovascular: Denies palpitation, chest discomfort Gastrointestinal:  Denies nausea, heartburn or change in bowel habits Skin: Denies abnormal skin rashes Lymphatics: Denies new lymphadenopathy or easy bruising Neurological:Denies numbness, tingling or new weaknesses Behavioral/Psych: Mood is stable, no new changes  Extremities: No lower extremity edema Breast: Multiple extensive cutaneous metastases All other systems were reviewed with the patient and are negative.  I have reviewed the past medical history, past surgical history, social history and family history with the patient and they are unchanged from previous note.  ALLERGIES:  is allergic to penicillins and amoxicillin.  MEDICATIONS:  Current Outpatient Medications  Medication Sig Dispense Refill  . amLODipine (NORVASC)  10 MG tablet Take 10 mg by mouth daily.     Marland Kitchen Bioflavonoid Products (ESTER C PO) Take by mouth.    Marland Kitchen BLACK CURRANT SEED OIL PO Take by mouth.    . Cholecalciferol (VITAMIN D PO) Take 5,000 Units by mouth 2 (two) times daily.     . Fish Oil OIL by Does not apply route.    . Flaxseed Oil OIL by Does not apply route.    Marland Kitchen goserelin (ZOLADEX) 3.6 MG injection Inject 3.6 mg into the skin every 28 (twenty-eight) days.    Marland Kitchen letrozole (FEMARA) 2.5 MG tablet Take 1 tablet (2.5 mg total) by mouth daily. 60 tablet 0  . lidocaine-prilocaine (EMLA) cream Apply 1 application topically as needed. Apply 1 hr prior to zoladex injection. 30 g 0  . Misc Natural Products (DANDELION ROOT PO) Take by mouth.    . potassium chloride SA (K-DUR,KLOR-CON) 20 MEQ tablet Take 1 tablet (20 mEq total) by mouth 2 (two) times daily. 30 tablet 3  . [place], Absinthium, Oil OIL by Does not apply route.     No current facility-administered medications for this visit.     PHYSICAL EXAMINATION: ECOG PERFORMANCE STATUS: 1 - Symptomatic but completely ambulatory  Vitals:   07/14/18 1537  BP: (!) 211/127  Pulse: 88  Resp: 18  Temp: 98.8 F (37.1 C)  SpO2: 100%   Filed Weights   07/14/18 1537  Weight: 138 lb 11.2 oz (62.9 kg)    GENERAL:alert, no distress and comfortable SKIN: skin color, texture, turgor are normal, no rashes or significant lesions EYES: normal, Conjunctiva are pink and non-injected, sclera clear OROPHARYNX:no exudate, no erythema and lips, buccal mucosa, and tongue normal  NECK: supple, thyroid normal size, non-tender, without nodularity LYMPH:  no palpable lymphadenopathy in the cervical, axillary or inguinal LUNGS: clear to auscultation and percussion with normal breathing effort HEART: regular rate & rhythm and no murmurs and no lower extremity edema ABDOMEN:abdomen soft, non-tender and normal bowel sounds MUSCULOSKELETAL:no cyanosis of digits and no clubbing  NEURO: alert & oriented x 3 with  fluent speech, no focal motor/sensory deficits EXTREMITIES: No lower extremity edema    LABORATORY DATA:  I have reviewed the data as listed CMP Latest Ref Rng & Units 06/16/2018 05/14/2018 04/09/2018  Glucose 70 - 99 mg/dL 98 89 94  BUN 6 - 20 mg/dL 14 13 14   Creatinine 0.44 - 1.00 mg/dL 0.87 0.82 0.88  Sodium 135 - 145 mmol/L 142 141 141  Potassium 3.5 - 5.1 mmol/L 3.2(L) 3.4(L) 3.6  Chloride 98 - 111 mmol/L 103 107 102  CO2 22 - 32 mmol/L 27 27 29   Calcium 8.9 - 10.3 mg/dL 9.7 9.6 10.0  Total Protein 6.5 - 8.1 g/dL 7.6 7.4 7.8  Total Bilirubin 0.3 - 1.2 mg/dL 0.6 0.5 0.4  Alkaline Phos 38 - 126 U/L 134(H) 135(H) 151(H)  AST 15 - 41 U/L 15 16 15   ALT 0 - 44 U/L 13 15 12     Lab Results  Component Value Date   WBC 5.0 06/16/2018   HGB 14.7 06/16/2018   HCT 43.5 06/16/2018   MCV 90.1 06/16/2018   PLT 227 06/16/2018   NEUTROABS 2.7 06/16/2018    ASSESSMENT & PLAN:  Breast cancer of upper-outer quadrant of right female breast ([place]) Breast cancer of upper-outer quadrant of right female breast ([place]) Recurrent  right breast cancer initially DCIS treated with mastectomy followed by reconstruction and later developed chest wall recurrence in 2013 ER/PR positive HER-2 negative, patient underwent alternative therapies in [place] but could not afford the trips and hence she has not been on any breast cancer therapy for a long time.  PET/CT scan 11/12/2014 did not show metastatic disease but showed extensive involvement in the right breast in an around the implant Stage 3B  Right mastectomy 08/29/2015 at [facility] (Dr.Singh): IDC with invol of skin and skin ulceration, breast capsule inv cancer, superior medial margin positive, 1/1 LN positive, grade 3, 14.3 cm, 3.5 cm, ALI, chest wall involv, ER 90-100%, PR 80-90%, HER-2 negative, Ki 67 40-50% T4CN1 (St 3B)  Treatment plan:(Patient is premenopausal based on blood work done May 2017, estradiol 250, FSH 5.4) 1. Patient started tamoxifen but  developed severe headache with severe hypertension and tamoxifen was discontinued 03/17/2016. 2. 02/2017 started on Exemestane, Zoladex started 05/29/2017, she does have some joint aches and pains   CT chest abdomen pelvis being done today. I will call her with results of this test. We will schedule her monthly for Zoladex injections Every 79-monthfor follow-up with me.    No orders of the defined types were placed in this encounter.  The patient has a good understanding of the overall plan. she agrees with it. she will call with any problems that may develop before the next visit here.   VHarriette Ohara MD 07/14/18

## 2018-07-14 NOTE — Telephone Encounter (Signed)
Gave avs and calendar ° °

## 2018-07-14 NOTE — Assessment & Plan Note (Signed)
Breast cancer of upper-outer quadrant of right female breast ([place]) Recurrent right breast cancer initially DCIS treated with mastectomy followed by reconstruction and later developed chest wall recurrence in 2013 ER/PR positive HER-2 negative, patient underwent alternative therapies in [place] but could not afford the trips and hence she has not been on any breast cancer therapy for a long time.  PET/CT scan 11/12/2014 did not show metastatic disease but showed extensive involvement in the right breast in an around the implant Stage 3B  Right mastectomy 08/29/2015 at [facility] (Dr.Singh): IDC with invol of skin and skin ulceration, breast capsule inv cancer, superior medial margin positive, 1/1 LN positive, grade 3, 14.3 cm, 3.5 cm, ALI, chest wall involv, ER 90-100%, PR 80-90%, HER-2 negative, Ki 67 40-50% T4CN1 (St 3B)  Treatment plan:(Patient is premenopausal based on blood work done May 2017, estradiol 250, FSH 5.4) 1. Patient started tamoxifen but developed severe headache with severe hypertension and tamoxifen was discontinued 03/17/2016. 2. 02/2017 started on Exemestane, Zoladex started 05/29/2017, she does have some joint aches and pains   CT chest abdomen pelvis being done today. I will call her with results of this test. We will schedule her monthly for Zoladex injections Every 106-monthfor follow-up with me.

## 2018-07-15 ENCOUNTER — Telehealth: Payer: Self-pay | Admitting: Hematology and Oncology

## 2018-07-15 NOTE — Telephone Encounter (Signed)
I left a message for the patient that the CT chest abdomen pelvis showed stable findings in the chest wall bones and the small lung nodules.  I instructed her to call me if she has any questions.  We do not intend to change her treatment at this time.

## 2018-08-08 ENCOUNTER — Other Ambulatory Visit: Payer: Self-pay

## 2018-08-08 DIAGNOSIS — C50411 Malignant neoplasm of upper-outer quadrant of right female breast: Secondary | ICD-10-CM

## 2018-08-11 ENCOUNTER — Inpatient Hospital Stay: Payer: Medicare Other

## 2018-08-11 ENCOUNTER — Telehealth: Payer: Self-pay | Admitting: Hematology and Oncology

## 2018-08-11 NOTE — Telephone Encounter (Signed)
Returned pts call to r/s appts   °

## 2018-08-13 ENCOUNTER — Inpatient Hospital Stay: Payer: Medicare Other | Attending: Hematology and Oncology

## 2018-08-13 ENCOUNTER — Inpatient Hospital Stay: Payer: Medicare Other

## 2018-08-13 VITALS — BP 167/99 | HR 79 | Temp 98.7°F | Resp 18

## 2018-08-13 DIAGNOSIS — Z79899 Other long term (current) drug therapy: Secondary | ICD-10-CM | POA: Insufficient documentation

## 2018-08-13 DIAGNOSIS — C792 Secondary malignant neoplasm of skin: Secondary | ICD-10-CM | POA: Insufficient documentation

## 2018-08-13 DIAGNOSIS — Z170 Estrogen receptor positive status [ER+]: Secondary | ICD-10-CM | POA: Diagnosis not present

## 2018-08-13 DIAGNOSIS — C50411 Malignant neoplasm of upper-outer quadrant of right female breast: Secondary | ICD-10-CM

## 2018-08-13 DIAGNOSIS — Z9011 Acquired absence of right breast and nipple: Secondary | ICD-10-CM | POA: Diagnosis not present

## 2018-08-13 DIAGNOSIS — Z79811 Long term (current) use of aromatase inhibitors: Secondary | ICD-10-CM | POA: Insufficient documentation

## 2018-08-13 LAB — CBC WITH DIFFERENTIAL (CANCER CENTER ONLY)
Abs Immature Granulocytes: 0.01 10*3/uL (ref 0.00–0.07)
Basophils Absolute: 0 10*3/uL (ref 0.0–0.1)
Basophils Relative: 1 %
Eosinophils Absolute: 0.1 10*3/uL (ref 0.0–0.5)
Eosinophils Relative: 1 %
HCT: 42.5 % (ref 36.0–46.0)
Hemoglobin: 14.2 g/dL (ref 12.0–15.0)
Immature Granulocytes: 0 %
Lymphocytes Relative: 39 %
Lymphs Abs: 2.2 10*3/uL (ref 0.7–4.0)
MCH: 29.9 pg (ref 26.0–34.0)
MCHC: 33.4 g/dL (ref 30.0–36.0)
MCV: 89.5 fL (ref 80.0–100.0)
Monocytes Absolute: 0.5 10*3/uL (ref 0.1–1.0)
Monocytes Relative: 9 %
Neutro Abs: 2.9 10*3/uL (ref 1.7–7.7)
Neutrophils Relative %: 50 %
Platelet Count: 245 10*3/uL (ref 150–400)
RBC: 4.75 MIL/uL (ref 3.87–5.11)
RDW: 12.5 % (ref 11.5–15.5)
WBC Count: 5.8 10*3/uL (ref 4.0–10.5)
nRBC: 0 % (ref 0.0–0.2)

## 2018-08-13 LAB — CMP (CANCER CENTER ONLY)
ALT: 12 U/L (ref 0–44)
AST: 15 U/L (ref 15–41)
Albumin: 4.1 g/dL (ref 3.5–5.0)
Alkaline Phosphatase: 129 U/L — ABNORMAL HIGH (ref 38–126)
Anion gap: 10 (ref 5–15)
BUN: 15 mg/dL (ref 6–20)
CO2: 28 mmol/L (ref 22–32)
Calcium: 10 mg/dL (ref 8.9–10.3)
Chloride: 106 mmol/L (ref 98–111)
Creatinine: 0.8 mg/dL (ref 0.44–1.00)
GFR, Est AFR Am: >60 mL/min (ref >60)
GFR, Estimated: >60 mL/min (ref >60)
Glucose, Bld: 89 mg/dL (ref 70–99)
Potassium: 3.6 mmol/L (ref 3.5–5.1)
Sodium: 144 mmol/L (ref 135–145)
Total Bilirubin: 0.5 mg/dL (ref 0.3–1.2)
Total Protein: 7.6 g/dL (ref 6.5–8.1)

## 2018-08-13 MED ORDER — GOSERELIN ACETATE 3.6 MG ~~LOC~~ IMPL
DRUG_IMPLANT | SUBCUTANEOUS | Status: AC
  Filled 2018-08-13: qty 3.6

## 2018-08-13 MED ORDER — GOSERELIN ACETATE 3.6 MG ~~LOC~~ IMPL
3.6000 mg | DRUG_IMPLANT | Freq: Once | SUBCUTANEOUS | Status: AC
  Administered 2018-08-13: 3.6 mg via SUBCUTANEOUS

## 2018-08-13 NOTE — Patient Instructions (Signed)
Goserelin injection What is this medicine? GOSERELIN (GOE se rel in) is similar to a hormone found in the body. It lowers the amount of sex hormones that the body makes. Men will have lower testosterone levels and women will have lower estrogen levels while taking this medicine. In men, this medicine is used to treat prostate cancer; the injection is either given once per month or once every 12 weeks. A once per month injection (only) is used to treat women with endometriosis, dysfunctional uterine bleeding, or advanced breast cancer. This medicine may be used for other purposes; ask your health care provider or pharmacist if you have questions. COMMON BRAND NAME(S): Zoladex What should I tell my health care provider before I take this medicine? They need to know if you have any of these conditions (some only apply to women): -diabetes -heart disease or previous heart attack -high blood pressure -high cholesterol -kidney disease -osteoporosis or low bone density -problems passing urine -spinal cord injury -stroke -tobacco smoker -an unusual or allergic reaction to goserelin, hormone therapy, other medicines, foods, dyes, or preservatives -pregnant or trying to get pregnant -breast-feeding How should I use this medicine? This medicine is for injection under the skin. It is given by a health care professional in a hospital or clinic setting. Men receive this injection once every 4 weeks or once every 12 weeks. Women will only receive the once every 4 weeks injection. Talk to your pediatrician regarding the use of this medicine in children. Special care may be needed. Overdosage: If you think you have taken too much of this medicine contact a poison control center or emergency room at once. NOTE: This medicine is only for you. Do not share this medicine with others. What if I miss a dose? It is important not to miss your dose. Call your doctor or health care professional if you are unable to  keep an appointment. What may interact with this medicine? -female hormones like estrogen -herbal or dietary supplements like black cohosh, chasteberry, or DHEA -female hormones like testosterone -prasterone This list may not describe all possible interactions. Give your health care provider a list of all the medicines, herbs, non-prescription drugs, or dietary supplements you use. Also tell them if you smoke, drink alcohol, or use illegal drugs. Some items may interact with your medicine. What should I watch for while using this medicine? Visit your doctor or health care professional for regular checks on your progress. Your symptoms may appear to get worse during the first weeks of this therapy. Tell your doctor or healthcare professional if your symptoms do not start to get better or if they get worse after this time. Your bones may get weaker if you take this medicine for a long time. If you smoke or frequently drink alcohol you may increase your risk of bone loss. A family history of osteoporosis, chronic use of drugs for seizures (convulsions), or corticosteroids can also increase your risk of bone loss. Talk to your doctor about how to keep your bones strong. This medicine should stop regular monthly menstration in women. Tell your doctor if you continue to menstrate. Women should not become pregnant while taking this medicine or for 12 weeks after stopping this medicine. Women should inform their doctor if they wish to become pregnant or think they might be pregnant. There is a potential for serious side effects to an unborn child. Talk to your health care professional or pharmacist for more information. Do not breast-feed an infant while taking   this medicine. Men should inform their doctors if they wish to father a child. This medicine may lower sperm counts. Talk to your health care professional or pharmacist for more information. What side effects may I notice from receiving this  medicine? Side effects that you should report to your doctor or health care professional as soon as possible: -allergic reactions like skin rash, itching or hives, swelling of the face, lips, or tongue -bone pain -breathing problems -changes in vision -chest pain -feeling faint or lightheaded, falls -fever, chills -pain, swelling, warmth in the leg -pain, tingling, numbness in the hands or feet -signs and symptoms of low blood pressure like dizziness; feeling faint or lightheaded, falls; unusually weak or tired -stomach pain -swelling of the ankles, feet, hands -trouble passing urine or change in the amount of urine -unusually high or low blood pressure -unusually weak or tired Side effects that usually do not require medical attention (report to your doctor or health care professional if they continue or are bothersome): -change in sex drive or performance -changes in breast size in both males and females -changes in emotions or moods -headache -hot flashes -irritation at site where injected -loss of appetite -skin problems like acne, dry skin -vaginal dryness This list may not describe all possible side effects. Call your doctor for medical advice about side effects. You may report side effects to FDA at 1-800-FDA-1088. Where should I keep my medicine? This drug is given in a hospital or clinic and will not be stored at home. NOTE: This sheet is a summary. It may not cover all possible information. If you have questions about this medicine, talk to your doctor, pharmacist, or health care provider.  2018 Elsevier/Gold Standard (2013-12-29 11:10:35)  

## 2018-09-05 ENCOUNTER — Other Ambulatory Visit: Payer: Self-pay

## 2018-09-05 DIAGNOSIS — C50411 Malignant neoplasm of upper-outer quadrant of right female breast: Secondary | ICD-10-CM

## 2018-09-08 ENCOUNTER — Other Ambulatory Visit: Payer: Self-pay

## 2018-09-08 ENCOUNTER — Encounter: Payer: Self-pay | Admitting: Hematology and Oncology

## 2018-09-08 ENCOUNTER — Inpatient Hospital Stay: Payer: Medicare Other | Attending: Hematology and Oncology

## 2018-09-08 ENCOUNTER — Inpatient Hospital Stay: Payer: Medicare Other

## 2018-09-08 VITALS — BP 174/114 | HR 86 | Temp 98.4°F | Resp 18

## 2018-09-08 DIAGNOSIS — C792 Secondary malignant neoplasm of skin: Secondary | ICD-10-CM | POA: Diagnosis not present

## 2018-09-08 DIAGNOSIS — Z170 Estrogen receptor positive status [ER+]: Secondary | ICD-10-CM | POA: Diagnosis not present

## 2018-09-08 DIAGNOSIS — Z5111 Encounter for antineoplastic chemotherapy: Secondary | ICD-10-CM | POA: Diagnosis not present

## 2018-09-08 DIAGNOSIS — Z9011 Acquired absence of right breast and nipple: Secondary | ICD-10-CM | POA: Diagnosis not present

## 2018-09-08 DIAGNOSIS — Z79811 Long term (current) use of aromatase inhibitors: Secondary | ICD-10-CM | POA: Diagnosis not present

## 2018-09-08 DIAGNOSIS — C50411 Malignant neoplasm of upper-outer quadrant of right female breast: Secondary | ICD-10-CM

## 2018-09-08 MED ORDER — LETROZOLE 2.5 MG PO TABS: 2.5000 mg | ORAL_TABLET | Freq: Every day | ORAL | 0 refills | Status: DC

## 2018-09-08 MED ORDER — GOSERELIN ACETATE 3.6 MG ~~LOC~~ IMPL
3.6000 mg | DRUG_IMPLANT | Freq: Once | SUBCUTANEOUS | Status: AC
  Administered 2018-09-08: 3.6 mg via SUBCUTANEOUS

## 2018-09-08 MED ORDER — GOSERELIN ACETATE 3.6 MG ~~LOC~~ IMPL
DRUG_IMPLANT | SUBCUTANEOUS | Status: AC
  Filled 2018-09-08: qty 3.6

## 2018-09-08 MED FILL — LETROZOLE 2.5 MG TABLET: 2.5 | 60 days supply | Qty: 60 | Fill #0

## 2018-09-08 NOTE — Patient Instructions (Signed)
Goserelin injection What is this medicine? GOSERELIN (GOE se rel in) is similar to a hormone found in the body. It lowers the amount of sex hormones that the body makes. Men will have lower testosterone levels and women will have lower estrogen levels while taking this medicine. In men, this medicine is used to treat prostate cancer; the injection is either given once per month or once every 12 weeks. A once per month injection (only) is used to treat women with endometriosis, dysfunctional uterine bleeding, or advanced breast cancer. This medicine may be used for other purposes; ask your health care provider or pharmacist if you have questions. COMMON BRAND NAME(S): Zoladex What should I tell my health care provider before I take this medicine? They need to know if you have any of these conditions (some only apply to women): -diabetes -heart disease or previous heart attack -high blood pressure -high cholesterol -kidney disease -osteoporosis or low bone density -problems passing urine -spinal cord injury -stroke -tobacco smoker -an unusual or allergic reaction to goserelin, hormone therapy, other medicines, foods, dyes, or preservatives -pregnant or trying to get pregnant -breast-feeding How should I use this medicine? This medicine is for injection under the skin. It is given by a health care professional in a hospital or clinic setting. Men receive this injection once every 4 weeks or once every 12 weeks. Women will only receive the once every 4 weeks injection. Talk to your pediatrician regarding the use of this medicine in children. Special care may be needed. Overdosage: If you think you have taken too much of this medicine contact a poison control center or emergency room at once. NOTE: This medicine is only for you. Do not share this medicine with others. What if I miss a dose? It is important not to miss your dose. Call your doctor or health care professional if you are unable to  keep an appointment. What may interact with this medicine? -female hormones like estrogen -herbal or dietary supplements like black cohosh, chasteberry, or DHEA -female hormones like testosterone -prasterone This list may not describe all possible interactions. Give your health care provider a list of all the medicines, herbs, non-prescription drugs, or dietary supplements you use. Also tell them if you smoke, drink alcohol, or use illegal drugs. Some items may interact with your medicine. What should I watch for while using this medicine? Visit your doctor or health care professional for regular checks on your progress. Your symptoms may appear to get worse during the first weeks of this therapy. Tell your doctor or healthcare professional if your symptoms do not start to get better or if they get worse after this time. Your bones may get weaker if you take this medicine for a long time. If you smoke or frequently drink alcohol you may increase your risk of bone loss. A family history of osteoporosis, chronic use of drugs for seizures (convulsions), or corticosteroids can also increase your risk of bone loss. Talk to your doctor about how to keep your bones strong. This medicine should stop regular monthly menstration in women. Tell your doctor if you continue to menstrate. Women should not become pregnant while taking this medicine or for 12 weeks after stopping this medicine. Women should inform their doctor if they wish to become pregnant or think they might be pregnant. There is a potential for serious side effects to an unborn child. Talk to your health care professional or pharmacist for more information. Do not breast-feed an infant while taking   this medicine. Men should inform their doctors if they wish to father a child. This medicine may lower sperm counts. Talk to your health care professional or pharmacist for more information. What side effects may I notice from receiving this  medicine? Side effects that you should report to your doctor or health care professional as soon as possible: -allergic reactions like skin rash, itching or hives, swelling of the face, lips, or tongue -bone pain -breathing problems -changes in vision -chest pain -feeling faint or lightheaded, falls -fever, chills -pain, swelling, warmth in the leg -pain, tingling, numbness in the hands or feet -signs and symptoms of low blood pressure like dizziness; feeling faint or lightheaded, falls; unusually weak or tired -stomach pain -swelling of the ankles, feet, hands -trouble passing urine or change in the amount of urine -unusually high or low blood pressure -unusually weak or tired Side effects that usually do not require medical attention (report to your doctor or health care professional if they continue or are bothersome): -change in sex drive or performance -changes in breast size in both males and females -changes in emotions or moods -headache -hot flashes -irritation at site where injected -loss of appetite -skin problems like acne, dry skin -vaginal dryness This list may not describe all possible side effects. Call your doctor for medical advice about side effects. You may report side effects to FDA at 1-800-FDA-1088. Where should I keep my medicine? This drug is given in a hospital or clinic and will not be stored at home. NOTE: This sheet is a summary. It may not cover all possible information. If you have questions about this medicine, talk to your doctor, pharmacist, or health care provider.  2018 Elsevier/Gold Standard (2013-12-29 11:10:35)  

## 2018-09-08 NOTE — Progress Notes (Signed)
Patient came in with concerns regarding Letrozole. Patient has grant funds to use at [employer].  Called [facility] pharmacy to check if they carry it. They do.  Went to physician to request script be submitted to [facility] to be filled and billed to grant. May RN was handling.  Advised patient to head to [facility] pharmacy to pick up RX and to advise she has the grant. Gave her directions. She verbalized understanding.

## 2018-09-09 ENCOUNTER — Encounter: Payer: Self-pay | Admitting: Hematology and Oncology

## 2018-09-09 NOTE — Progress Notes (Signed)
Called patient to confirm she was able to pick up Letrozole(Femara) from [facility] yesterday using the grant. She states she was after it had to be transferred from pharmacy it was sent to in error.  She has my contact name and number for any additional financial questions or concerns.

## 2018-10-07 ENCOUNTER — Inpatient Hospital Stay: Payer: Medicare Other | Admitting: Hematology and Oncology

## 2018-10-07 ENCOUNTER — Inpatient Hospital Stay: Payer: Medicare Other

## 2018-10-07 NOTE — Assessment & Plan Note (Deleted)
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  PET/CT scan 11/12/2014 did not show metastatic disease but showed extensive involvement in the right breast in an around the implant Stage 3B  Right mastectomy 08/29/2015 at [place] (Dr.Singh): IDC with invol of skin and skin ulceration, breast capsule inv cancer, superior medial margin positive, 1/1 LN positive, grade 3, 14.3 cm, 3.5 cm, ALI, chest wall involv, ER 90-100%, PR 80-90%, HER-2 negative, Ki 67 40-50% T4CN1 (St 3B)  Treatment plan:(Patient is premenopausal based on blood work done May 2017, estradiol 250, FSH 5.4) 1. Patient started tamoxifen but developed severe headache with severe hypertension and tamoxifen was discontinued 03/17/2016. 2. 02/2017 started on Exemestane, Zoladex started 05/29/2017, she does have some joint aches and pains   CT CAP 07/14/2018: Chest wall recurrence and metastatic right axillary lymphadenopathy similar to previous study.  Several bone lesions similar.  Multiple pulmonary nodules 5 mm or less favored benign  Plan: Continue with current therapy rescan in 6 months

## 2018-10-08 ENCOUNTER — Telehealth: Payer: Self-pay | Admitting: Hematology and Oncology

## 2018-10-08 NOTE — Telephone Encounter (Signed)
R/s appt per 12/3 sch message - pt is aware of appt date and time

## 2018-10-13 ENCOUNTER — Inpatient Hospital Stay: Payer: Medicare Other | Attending: Hematology and Oncology | Admitting: Hematology and Oncology

## 2018-10-13 ENCOUNTER — Inpatient Hospital Stay: Payer: Medicare Other

## 2018-10-13 DIAGNOSIS — Z9011 Acquired absence of right breast and nipple: Secondary | ICD-10-CM | POA: Diagnosis not present

## 2018-10-13 DIAGNOSIS — C50411 Malignant neoplasm of upper-outer quadrant of right female breast: Secondary | ICD-10-CM

## 2018-10-13 DIAGNOSIS — Z170 Estrogen receptor positive status [ER+]: Secondary | ICD-10-CM

## 2018-10-13 DIAGNOSIS — Z79899 Other long term (current) drug therapy: Secondary | ICD-10-CM | POA: Insufficient documentation

## 2018-10-13 MED ORDER — GOSERELIN ACETATE 3.6 MG ~~LOC~~ IMPL
DRUG_IMPLANT | SUBCUTANEOUS | Status: AC
  Filled 2018-10-13: qty 3.6

## 2018-10-13 MED ORDER — GOSERELIN ACETATE 3.6 MG ~~LOC~~ IMPL
3.6000 mg | DRUG_IMPLANT | Freq: Once | SUBCUTANEOUS | Status: AC
  Administered 2018-10-13: 3.6 mg via SUBCUTANEOUS

## 2018-10-13 NOTE — Patient Instructions (Signed)
Goserelin injection What is this medicine? GOSERELIN (GOE se rel in) is similar to a hormone found in the body. It lowers the amount of sex hormones that the body makes. Men will have lower testosterone levels and women will have lower estrogen levels while taking this medicine. In men, this medicine is used to treat prostate cancer; the injection is either given once per month or once every 12 weeks. A once per month injection (only) is used to treat women with endometriosis, dysfunctional uterine bleeding, or advanced breast cancer. This medicine may be used for other purposes; ask your health care provider or pharmacist if you have questions. COMMON BRAND NAME(S): Zoladex What should I tell my health care provider before I take this medicine? They need to know if you have any of these conditions (some only apply to women): -diabetes -heart disease or previous heart attack -high blood pressure -high cholesterol -kidney disease -osteoporosis or low bone density -problems passing urine -spinal cord injury -stroke -tobacco smoker -an unusual or allergic reaction to goserelin, hormone therapy, other medicines, foods, dyes, or preservatives -pregnant or trying to get pregnant -breast-feeding How should I use this medicine? This medicine is for injection under the skin. It is given by a health care professional in a hospital or clinic setting. Men receive this injection once every 4 weeks or once every 12 weeks. Women will only receive the once every 4 weeks injection. Talk to your pediatrician regarding the use of this medicine in children. Special care may be needed. Overdosage: If you think you have taken too much of this medicine contact a poison control center or emergency room at once. NOTE: This medicine is only for you. Do not share this medicine with others. What if I miss a dose? It is important not to miss your dose. Call your doctor or health care professional if you are unable to  keep an appointment. What may interact with this medicine? -female hormones like estrogen -herbal or dietary supplements like black cohosh, chasteberry, or DHEA -female hormones like testosterone -prasterone This list may not describe all possible interactions. Give your health care provider a list of all the medicines, herbs, non-prescription drugs, or dietary supplements you use. Also tell them if you smoke, drink alcohol, or use illegal drugs. Some items may interact with your medicine. What should I watch for while using this medicine? Visit your doctor or health care professional for regular checks on your progress. Your symptoms may appear to get worse during the first weeks of this therapy. Tell your doctor or healthcare professional if your symptoms do not start to get better or if they get worse after this time. Your bones may get weaker if you take this medicine for a long time. If you smoke or frequently drink alcohol you may increase your risk of bone loss. A family history of osteoporosis, chronic use of drugs for seizures (convulsions), or corticosteroids can also increase your risk of bone loss. Talk to your doctor about how to keep your bones strong. This medicine should stop regular monthly menstration in women. Tell your doctor if you continue to menstrate. Women should not become pregnant while taking this medicine or for 12 weeks after stopping this medicine. Women should inform their doctor if they wish to become pregnant or think they might be pregnant. There is a potential for serious side effects to an unborn child. Talk to your health care professional or pharmacist for more information. Do not breast-feed an infant while taking   this medicine. Men should inform their doctors if they wish to father a child. This medicine may lower sperm counts. Talk to your health care professional or pharmacist for more information. What side effects may I notice from receiving this  medicine? Side effects that you should report to your doctor or health care professional as soon as possible: -allergic reactions like skin rash, itching or hives, swelling of the face, lips, or tongue -bone pain -breathing problems -changes in vision -chest pain -feeling faint or lightheaded, falls -fever, chills -pain, swelling, warmth in the leg -pain, tingling, numbness in the hands or feet -signs and symptoms of low blood pressure like dizziness; feeling faint or lightheaded, falls; unusually weak or tired -stomach pain -swelling of the ankles, feet, hands -trouble passing urine or change in the amount of urine -unusually high or low blood pressure -unusually weak or tired Side effects that usually do not require medical attention (report to your doctor or health care professional if they continue or are bothersome): -change in sex drive or performance -changes in breast size in both males and females -changes in emotions or moods -headache -hot flashes -irritation at site where injected -loss of appetite -skin problems like acne, dry skin -vaginal dryness This list may not describe all possible side effects. Call your doctor for medical advice about side effects. You may report side effects to FDA at 1-800-FDA-1088. Where should I keep my medicine? This drug is given in a hospital or clinic and will not be stored at home. NOTE: This sheet is a summary. It may not cover all possible information. If you have questions about this medicine, talk to your doctor, pharmacist, or health care provider.  2018 Elsevier/Gold Standard (2013-12-29 11:10:35)  

## 2018-10-13 NOTE — Assessment & Plan Note (Signed)
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  PET/CT scan 11/12/2014 did not show metastatic disease but showed extensive involvement in the right breast in an around the implant Stage 3B  Right mastectomy 08/29/2015 at [place] (Dr.Singh): IDC with invol of skin and skin ulceration, breast capsule inv cancer, superior medial margin positive, 1/1 LN positive, grade 3, 14.3 cm, 3.5 cm, ALI, chest wall involv, ER 90-100%, PR 80-90%, HER-2 negative, Ki 67 40-50% T4CN1 (St 3B)  Treatment plan:(Patient is premenopausal based on blood work done May 2017, estradiol 250, FSH 5.4) 1. Patient started tamoxifen but developed severe headache with severe hypertension and tamoxifen was discontinued 03/17/2016. 2. 02/2017 started on Exemestane, Zoladex started 05/29/2017, she does have some joint aches and pains   CT CAP 07/14/2018: Chest wall recurrence and metastatic right axillary lymphadenopathy similar to previous study.  Several bone lesions similar.  Multiple pulmonary nodules 5 mm or less favored benign  Plan: Continue with current therapy rescan in 6 months

## 2018-10-13 NOTE — Progress Notes (Signed)
Patient Care Team: Nicholas Lose, MD as PCP - General (Hematology and Oncology)  DIAGNOSIS:  Encounter Diagnosis  Name Primary?  . Malignant neoplasm of upper-outer quadrant of right breast in female, estrogen receptor positive ([place])     SUMMARY OF ONCOLOGIC HISTORY:   Breast cancer of upper-outer quadrant of right female breast ([place])   09/21/2010 Mammogram    right breast linear and segmental pleomorphic calcifications from 4:00 to 6:00 position ultrasound revealed 1.6 cm and 1.1 cm masses    09/27/2010 Initial Biopsy    ultrasound-guided biopsy of all masses showed DCIS grade 2; patient went to cancer treatment centers of [place] for second opinion and delayed therapy    01/26/2011 Surgery    right mastectomy followed by reconstruction: Invasive ductal carcinoma T1 N1 MIC M0 stage IB ER/PR positive HER-2 negative, BRCA negative, Oncotype DX low risk    05/29/2012 Procedure    right chest wall nodule excision done on 06/24/2012 showed metastatic carcinoma margins were positive, but CT scan no metastatic disease, recommended chemotherapy but patient refused also refused reexcision    04/28/2013 Treatment Plan Change    patient went to [place] for alternative treatments and took herbal medications, tonics etc. But she could not afford these trips.    01/08/2014 Breast MRI    right breast multiple enhancing masses within the soft tissues largest 5.6 cm in wall skin surface and the capsule of the silicone prosthesis, contiguous nodules involving in inferomedial breast and tired and subcentimeter nodules across the midline    08/29/2015 Surgery    Right mastectomy: IDC with invol of skin and skin ulceration, breast capsule inv cancer, superior medial margin positive, 1/1 LN positive, grade 3, 14.3 cm, 3.5 cm, ALI, chest wall involv, ER 90-100%, PR 80-90%, HER-2 negative, Ki 67 40-50% T4CN1 (St 3B)    03/05/2016 -  Anti-estrogen oral therapy    Tamoxifen 20 mg daily stopped due to headache  and uncontrolled hypertension. Decrease to 10 mg daily 04/03/2016, stopped June 2017 and took an estrogen metabolizer over-the-counter; started Aromasin April 2018 from a [place] physician, Zoladex started on 05/2017 and switched to Letrozole in 09/2017     CHIEF COMPLIANT: Follow-up of metastatic breast cancer  INTERVAL HISTORY: Misty Arnold is a 46-year with above-mentioned history metastatic breast cancer that is estrogen receptor positive who is currently on Zoladex with letrozole and appears to be tolerating the treatment much better.  The pain has improved.  She is doing much better with regards to the breast cancer but apparently it has significantly decreased in size.  REVIEW OF SYSTEMS:   Constitutional: Denies fevers, chills or abnormal weight loss Eyes: Denies blurriness of vision Ears, nose, mouth, throat, and face: Denies mucositis or sore throat Respiratory: Denies cough, dyspnea or wheezes Cardiovascular: Denies palpitation, chest discomfort Gastrointestinal:  Denies nausea, heartburn or change in bowel habits Skin: Denies abnormal skin rashes Lymphatics: Denies new lymphadenopathy or easy bruising Neurological:Denies numbness, tingling or new weaknesses Behavioral/Psych: Mood is stable, no new changes  Extremities: No lower extremity edema Breast: Marked improvement in the breast tumor All other systems were reviewed with the patient and are negative.  I have reviewed the past medical history, past surgical history, social history and family history with the patient and they are unchanged from previous note.  ALLERGIES:  is allergic to penicillins and amoxicillin.  MEDICATIONS:  Current Outpatient Medications  Medication Sig Dispense Refill  . amLODipine (NORVASC) 10 MG tablet Take 10 mg by mouth daily.     Marland Kitchen  Bioflavonoid Products (ESTER C PO) Take by mouth.    Marland Kitchen BLACK CURRANT SEED OIL PO Take by mouth.    . Cholecalciferol (VITAMIN D PO) Take 5,000 Units by mouth 2  (two) times daily.     . Fish Oil OIL by Does not apply route.    . Flaxseed Oil OIL by Does not apply route.    Marland Kitchen goserelin (ZOLADEX) 3.6 MG injection Inject 3.6 mg into the skin every 28 (twenty-eight) days.    Marland Kitchen letrozole (FEMARA) 2.5 MG tablet Take 1 tablet (2.5 mg total) by mouth daily. 60 tablet 0  . lidocaine-prilocaine (EMLA) cream Apply 1 application topically as needed. Apply 1 hr prior to zoladex injection. 30 g 0  . Misc Natural Products (DANDELION ROOT PO) Take by mouth.    . potassium chloride SA (K-DUR,KLOR-CON) 20 MEQ tablet Take 1 tablet (20 mEq total) by mouth 2 (two) times daily. 30 tablet 3  . [place], Absinthium, Oil OIL by Does not apply route.     No current facility-administered medications for this visit.     PHYSICAL EXAMINATION: ECOG PERFORMANCE STATUS: 1 - Symptomatic but completely ambulatory  Vitals:   10/13/18 1604  BP: (!) 177/109  Pulse: (!) 102  Resp: 17  Temp: 98.6 F (37 C)  SpO2: 99%   Filed Weights   10/13/18 1604  Weight: 139 lb 14.4 oz (63.5 kg)    GENERAL:alert, no distress and comfortable SKIN: skin color, texture, turgor are normal, no rashes or significant lesions EYES: normal, Conjunctiva are pink and non-injected, sclera clear OROPHARYNX:no exudate, no erythema and lips, buccal mucosa, and tongue normal  NECK: supple, thyroid normal size, non-tender, without nodularity LYMPH:  no palpable lymphadenopathy in the cervical, axillary or inguinal LUNGS: clear to auscultation and percussion with normal breathing effort HEART: regular rate & rhythm and no murmurs and no lower extremity edema ABDOMEN:abdomen soft, non-tender and normal bowel sounds MUSCULOSKELETAL:no cyanosis of digits and no clubbing  NEURO: alert & oriented x 3 with fluent speech, no focal motor/sensory deficits EXTREMITIES: No lower extremity edema   LABORATORY DATA:  I have reviewed the data as listed CMP Latest Ref Rng & Units 08/13/2018 06/16/2018 05/14/2018    Glucose 70 - 99 mg/dL 89 98 89  BUN 6 - 20 mg/dL 15 14 13   Creatinine 0.44 - 1.00 mg/dL 0.80 0.87 0.82  Sodium 135 - 145 mmol/L 144 142 141  Potassium 3.5 - 5.1 mmol/L 3.6 3.2(L) 3.4(L)  Chloride 98 - 111 mmol/L 106 103 107  CO2 22 - 32 mmol/L 28 27 27   Calcium 8.9 - 10.3 mg/dL 10.0 9.7 9.6  Total Protein 6.5 - 8.1 g/dL 7.6 7.6 7.4  Total Bilirubin 0.3 - 1.2 mg/dL 0.5 0.6 0.5  Alkaline Phos 38 - 126 U/L 129(H) 134(H) 135(H)  AST 15 - 41 U/L 15 15 16   ALT 0 - 44 U/L 12 13 15     Lab Results  Component Value Date   WBC 5.8 08/13/2018   HGB 14.2 08/13/2018   HCT 42.5 08/13/2018   MCV 89.5 08/13/2018   PLT 245 08/13/2018   NEUTROABS 2.9 08/13/2018    ASSESSMENT & PLAN:  Breast cancer of upper-outer quadrant of right female breast ([place]) 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  PET/CT scan 11/12/2014 did not show metastatic disease but showed extensive involvement in the right breast in an around the implant Stage 3B  Right mastectomy 08/29/2015 at [place] (Dr.Singh): IDC with invol of skin and skin ulceration, breast capsule inv cancer, superior medial margin positive, 1/1 LN positive, grade 3, 14.3 cm, 3.5 cm, ALI, chest wall involv, ER 90-100%, PR 80-90%, HER-2 negative, Ki 67 40-50% T4CN1 (St 3B)  Treatment plan:(Patient is premenopausal based on blood work done May 2017, estradiol 250, FSH 5.4) 1. Patient started tamoxifen but developed severe headache with severe hypertension and tamoxifen was discontinued 03/17/2016. 2. 02/2017 started on Exemestane, Zoladex started 05/29/2017, she does have some joint aches and pains   CT CAP 07/14/2018: Chest wall recurrence and metastatic right axillary  lymphadenopathy similar to previous study.  Several bone lesions similar.  Multiple pulmonary nodules 5 mm or less favored benign  Plan: Continue with current therapy rescan in 3 months    No orders of the defined types were placed in this encounter.  The patient has a good understanding of the overall plan. she agrees with it. she will call with any problems that may develop before the next visit here.   Harriette Ohara, MD 10/13/18

## 2018-11-14 ENCOUNTER — Inpatient Hospital Stay: Payer: Medicare HMO

## 2018-11-14 MED ORDER — GOSERELIN ACETATE 3.6 MG ~~LOC~~ IMPL
DRUG_IMPLANT | SUBCUTANEOUS | Status: AC
  Filled 2018-11-14: qty 3.6

## 2018-11-19 ENCOUNTER — Inpatient Hospital Stay: Payer: Medicare HMO

## 2018-11-21 ENCOUNTER — Other Ambulatory Visit: Payer: Self-pay

## 2018-11-21 ENCOUNTER — Inpatient Hospital Stay: Payer: Medicare HMO | Attending: Hematology and Oncology

## 2018-11-21 VITALS — BP 182/109 | HR 99 | Temp 98.1°F | Resp 18

## 2018-11-21 DIAGNOSIS — Z170 Estrogen receptor positive status [ER+]: Secondary | ICD-10-CM

## 2018-11-21 DIAGNOSIS — Z9011 Acquired absence of right breast and nipple: Secondary | ICD-10-CM | POA: Diagnosis not present

## 2018-11-21 DIAGNOSIS — C50411 Malignant neoplasm of upper-outer quadrant of right female breast: Secondary | ICD-10-CM | POA: Diagnosis not present

## 2018-11-21 DIAGNOSIS — Z79899 Other long term (current) drug therapy: Secondary | ICD-10-CM | POA: Insufficient documentation

## 2018-11-21 MED ORDER — LETROZOLE 2.5 MG PO TABS: 2.5000 mg | ORAL_TABLET | Freq: Every day | ORAL | 1 refills | Status: DC

## 2018-11-21 MED ORDER — GOSERELIN ACETATE 3.6 MG ~~LOC~~ IMPL
3.6000 mg | DRUG_IMPLANT | Freq: Once | SUBCUTANEOUS | Status: AC
  Administered 2018-11-21: 3.6 mg via SUBCUTANEOUS

## 2018-11-21 MED ORDER — GOSERELIN ACETATE 3.6 MG ~~LOC~~ IMPL
DRUG_IMPLANT | SUBCUTANEOUS | Status: AC
  Filled 2018-11-21: qty 3.6

## 2018-11-21 MED ORDER — VITAMIN D 25 MCG (1000 UNIT) PO TABS: 1000.0000 [IU] | ORAL_TABLET | Freq: Every day | ORAL | 0 refills | Status: DC

## 2018-11-21 MED ORDER — CALCIUM CARBONATE-VITAMIN D 500-200 MG-UNIT PO TABS: 1.0000 | ORAL_TABLET | Freq: Two times a day (BID) | ORAL | 0 refills | Status: DC

## 2018-11-21 MED ORDER — LETROZOLE 2.5 MG PO TABS: 2.5000 mg | ORAL_TABLET | Freq: Every day | ORAL | Status: DC

## 2018-11-21 MED FILL — LETROZOLE 2.5 MG TABLET: 2.5 | 90 days supply | Qty: 90 | Fill #0

## 2018-11-21 NOTE — Patient Instructions (Signed)
Goserelin (Zoladex) injection What is this medicine? GOSERELIN (GOE se rel in) is similar to a hormone found in the body. It lowers the amount of sex hormones that the body makes. Men will have lower testosterone levels and women will have lower estrogen levels while taking this medicine. In men, this medicine is used to treat prostate cancer; the injection is either given once per month or once every 12 weeks. A once per month injection (only) is used to treat women with endometriosis, dysfunctional uterine bleeding, or advanced breast cancer. This medicine may be used for other purposes; ask your health care provider or pharmacist if you have questions. COMMON BRAND NAME(S): Zoladex What should I tell my health care provider before I take this medicine? They need to know if you have any of these conditions (some only apply to women): -diabetes -heart disease or previous heart attack -high blood pressure -high cholesterol -kidney disease -osteoporosis or low bone density -problems passing urine -spinal cord injury -stroke -tobacco smoker -an unusual or allergic reaction to goserelin, hormone therapy, other medicines, foods, dyes, or preservatives -pregnant or trying to get pregnant -breast-feeding How should I use this medicine? This medicine is for injection under the skin. It is given by a health care professional in a hospital or clinic setting. Men receive this injection once every 4 weeks or once every 12 weeks. Women will only receive the once every 4 weeks injection. Talk to your pediatrician regarding the use of this medicine in children. Special care may be needed. Overdosage: If you think you have taken too much of this medicine contact a poison control center or emergency room at once. NOTE: This medicine is only for you. Do not share this medicine with others. What if I miss a dose? It is important not to miss your dose. Call your doctor or health care professional if you are  unable to keep an appointment. What may interact with this medicine? -female hormones like estrogen -herbal or dietary supplements like black cohosh, chasteberry, or DHEA -female hormones like testosterone -prasterone This list may not describe all possible interactions. Give your health care provider a list of all the medicines, herbs, non-prescription drugs, or dietary supplements you use. Also tell them if you smoke, drink alcohol, or use illegal drugs. Some items may interact with your medicine. What should I watch for while using this medicine? Visit your doctor or health care professional for regular checks on your progress. Your symptoms may appear to get worse during the first weeks of this therapy. Tell your doctor or healthcare professional if your symptoms do not start to get better or if they get worse after this time. Your bones may get weaker if you take this medicine for a long time. If you smoke or frequently drink alcohol you may increase your risk of bone loss. A family history of osteoporosis, chronic use of drugs for seizures (convulsions), or corticosteroids can also increase your risk of bone loss. Talk to your doctor about how to keep your bones strong. This medicine should stop regular monthly menstration in women. Tell your doctor if you continue to [facility]. Women should not become pregnant while taking this medicine or for 12 weeks after stopping this medicine. Women should inform their doctor if they wish to become pregnant or think they might be pregnant. There is a potential for serious side effects to an unborn child. Talk to your health care professional or pharmacist for more information. Do not breast-feed an infant while  taking this medicine. Men should inform their doctors if they wish to father a child. This medicine may lower sperm counts. Talk to your health care professional or pharmacist for more information. What side effects may I notice from receiving this  medicine? Side effects that you should report to your doctor or health care professional as soon as possible: -allergic reactions like skin rash, itching or hives, swelling of the face, lips, or tongue -bone pain -breathing problems -changes in vision -chest pain -feeling faint or lightheaded, falls -fever, chills -pain, swelling, warmth in the leg -pain, tingling, numbness in the hands or feet -signs and symptoms of low blood pressure like dizziness; feeling faint or lightheaded, falls; unusually weak or tired -stomach pain -swelling of the ankles, feet, hands -trouble passing urine or change in the amount of urine -unusually high or low blood pressure -unusually weak or tired Side effects that usually do not require medical attention (report to your doctor or health care professional if they continue or are bothersome): -change in sex drive or performance -changes in breast size in both males and females -changes in emotions or moods -headache -hot flashes -irritation at site where injected -loss of appetite -skin problems like acne, dry skin -vaginal dryness This list may not describe all possible side effects. Call your doctor for medical advice about side effects. You may report side effects to FDA at 1-800-FDA-1088. Where should I keep my medicine? This drug is given in a hospital or clinic and will not be stored at home. NOTE: This sheet is a summary. It may not cover all possible information. If you have questions about this medicine, talk to your doctor, pharmacist, or health care provider.  2019 Elsevier/Gold Standard (2013-12-29 11:10:35)

## 2018-12-15 ENCOUNTER — Inpatient Hospital Stay: Payer: Medicare HMO | Attending: Hematology and Oncology

## 2018-12-15 ENCOUNTER — Inpatient Hospital Stay: Payer: Medicare HMO

## 2018-12-15 VITALS — BP 187/98 | HR 100 | Temp 98.4°F | Resp 16

## 2018-12-15 DIAGNOSIS — Z170 Estrogen receptor positive status [ER+]: Secondary | ICD-10-CM

## 2018-12-15 DIAGNOSIS — C50411 Malignant neoplasm of upper-outer quadrant of right female breast: Secondary | ICD-10-CM | POA: Insufficient documentation

## 2018-12-15 DIAGNOSIS — Z9011 Acquired absence of right breast and nipple: Secondary | ICD-10-CM | POA: Diagnosis not present

## 2018-12-15 DIAGNOSIS — Z79899 Other long term (current) drug therapy: Secondary | ICD-10-CM | POA: Insufficient documentation

## 2018-12-15 DIAGNOSIS — C170 Malignant neoplasm of duodenum: Secondary | ICD-10-CM | POA: Diagnosis not present

## 2018-12-15 MED ORDER — GOSERELIN ACETATE 3.6 MG ~~LOC~~ IMPL
DRUG_IMPLANT | SUBCUTANEOUS | Status: AC
  Filled 2018-12-15: qty 3.6

## 2018-12-15 MED ORDER — GOSERELIN ACETATE 3.6 MG ~~LOC~~ IMPL
3.6000 mg | DRUG_IMPLANT | Freq: Once | SUBCUTANEOUS | Status: AC
  Administered 2018-12-15: 3.6 mg via SUBCUTANEOUS

## 2018-12-15 NOTE — Patient Instructions (Signed)
Goserelin injection What is this medicine? GOSERELIN (GOE se rel in) is similar to a hormone found in the body. It lowers the amount of sex hormones that the body makes. Men will have lower testosterone levels and women will have lower estrogen levels while taking this medicine. In men, this medicine is used to treat prostate cancer; the injection is either given once per month or once every 12 weeks. A once per month injection (only) is used to treat women with endometriosis, dysfunctional uterine bleeding, or advanced breast cancer. This medicine may be used for other purposes; ask your health care provider or pharmacist if you have questions. COMMON BRAND NAME(S): Zoladex What should I tell my health care provider before I take this medicine? They need to know if you have any of these conditions (some only apply to women): -diabetes -heart disease or previous heart attack -high blood pressure -high cholesterol -kidney disease -osteoporosis or low bone density -problems passing urine -spinal cord injury -stroke -tobacco smoker -an unusual or allergic reaction to goserelin, hormone therapy, other medicines, foods, dyes, or preservatives -pregnant or trying to get pregnant -breast-feeding How should I use this medicine? This medicine is for injection under the skin. It is given by a health care professional in a hospital or clinic setting. Men receive this injection once every 4 weeks or once every 12 weeks. Women will only receive the once every 4 weeks injection. Talk to your pediatrician regarding the use of this medicine in children. Special care may be needed. Overdosage: If you think you have taken too much of this medicine contact a poison control center or emergency room at once. NOTE: This medicine is only for you. Do not share this medicine with others. What if I miss a dose? It is important not to miss your dose. Call your doctor or health care professional if you are unable to  keep an appointment. What may interact with this medicine? -female hormones like estrogen -herbal or dietary supplements like black cohosh, chasteberry, or DHEA -female hormones like testosterone -prasterone This list may not describe all possible interactions. Give your health care provider a list of all the medicines, herbs, non-prescription drugs, or dietary supplements you use. Also tell them if you smoke, drink alcohol, or use illegal drugs. Some items may interact with your medicine. What should I watch for while using this medicine? Visit your doctor or health care professional for regular checks on your progress. Your symptoms may appear to get worse during the first weeks of this therapy. Tell your doctor or healthcare professional if your symptoms do not start to get better or if they get worse after this time. Your bones may get weaker if you take this medicine for a long time. If you smoke or frequently drink alcohol you may increase your risk of bone loss. A family history of osteoporosis, chronic use of drugs for seizures (convulsions), or corticosteroids can also increase your risk of bone loss. Talk to your doctor about how to keep your bones strong. This medicine should stop regular monthly menstration in women. Tell your doctor if you continue to menstrate. Women should not become pregnant while taking this medicine or for 12 weeks after stopping this medicine. Women should inform their doctor if they wish to become pregnant or think they might be pregnant. There is a potential for serious side effects to an unborn child. Talk to your health care professional or pharmacist for more information. Do not breast-feed an infant while taking   this medicine. Men should inform their doctors if they wish to father a child. This medicine may lower sperm counts. Talk to your health care professional or pharmacist for more information. What side effects may I notice from receiving this  medicine? Side effects that you should report to your doctor or health care professional as soon as possible: -allergic reactions like skin rash, itching or hives, swelling of the face, lips, or tongue -bone pain -breathing problems -changes in vision -chest pain -feeling faint or lightheaded, falls -fever, chills -pain, swelling, warmth in the leg -pain, tingling, numbness in the hands or feet -signs and symptoms of low blood pressure like dizziness; feeling faint or lightheaded, falls; unusually weak or tired -stomach pain -swelling of the ankles, feet, hands -trouble passing urine or change in the amount of urine -unusually high or low blood pressure -unusually weak or tired Side effects that usually do not require medical attention (report to your doctor or health care professional if they continue or are bothersome): -change in sex drive or performance -changes in breast size in both males and females -changes in emotions or moods -headache -hot flashes -irritation at site where injected -loss of appetite -skin problems like acne, dry skin -vaginal dryness This list may not describe all possible side effects. Call your doctor for medical advice about side effects. You may report side effects to FDA at 1-800-FDA-1088. Where should I keep my medicine? This drug is given in a hospital or clinic and will not be stored at home. NOTE: This sheet is a summary. It may not cover all possible information. If you have questions about this medicine, talk to your doctor, pharmacist, or health care provider.  2019 Elsevier/Gold Standard (2013-12-29 11:10:35)  

## 2019-01-13 ENCOUNTER — Ambulatory Visit: Payer: Medicare Other

## 2019-01-14 NOTE — Progress Notes (Signed)
Patient Care Team: Nicholas Lose, MD as PCP - General (Hematology and Oncology)  DIAGNOSIS:    ICD-10-CM   1. 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; patient went to cancer treatment centers of [place] for second opinion and delayed therapy    01/26/2011 Surgery    right mastectomy followed by reconstruction: Invasive ductal carcinoma T1 N1 MIC M0 stage IB ER/PR positive HER-2 negative, BRCA negative, Oncotype DX low risk    05/29/2012 Procedure    right chest wall nodule excision done on 06/24/2012 showed metastatic carcinoma margins were positive, but CT scan no metastatic disease, recommended chemotherapy but patient refused also refused reexcision    04/28/2013 Treatment Plan Change    patient went to [place] for alternative treatments and took herbal medications, tonics etc. But she could not afford these trips.    01/08/2014 Breast MRI    right breast multiple enhancing masses within the soft tissues largest 5.6 cm in wall skin surface and the capsule of the silicone prosthesis, contiguous nodules involving in inferomedial breast and tired and subcentimeter nodules across the midline    08/29/2015 Surgery    Right mastectomy: IDC with invol of skin and skin ulceration, breast capsule inv cancer, superior medial margin positive, 1/1 LN positive, grade 3, 14.3 cm, 3.5 cm, ALI, chest wall involv, ER 90-100%, PR 80-90%, HER-2 negative, Ki 67 40-50% T4CN1 (St 3B)    03/05/2016 -  Anti-estrogen  oral therapy    Tamoxifen 20 mg daily stopped due to headache and uncontrolled hypertension. Decrease to 10 mg daily 04/03/2016, stopped June 2017 and took an estrogen metabolizer over-the-counter; started Aromasin April 2018 from a [place] physician, Zoladex started on 05/2017 and switched to Letrozole in 09/2017     CHIEF COMPLIANT: Follow-up of metastatic breast cancer on exemestane and Zoladex  INTERVAL HISTORY: Misty Arnold is a 48 y.o. with above-mentioned history of metastatic breast cancer who is currently on Zoladex with letrozole. She presents to the clinic alone today and is doing well. She reports she is getting stronger and has less pain. She notes mild hot flashes, mainly at night. She reviewed her medication list with me.   REVIEW OF SYSTEMS:   Constitutional: Denies fevers, chills or abnormal weight loss (+) hot flashes Eyes: Denies blurriness of vision Ears, nose, mouth, throat, and face: Denies mucositis or sore throat Respiratory: Denies cough, dyspnea or wheezes Cardiovascular: Denies palpitation, chest discomfort Gastrointestinal: Denies nausea, heartburn or change in bowel habits Skin: Denies abnormal skin rashes Lymphatics: Denies new lymphadenopathy or easy bruising Neurological: Denies numbness, tingling or new weaknesses Behavioral/Psych: Mood is stable, no new changes  Extremities: No lower extremity edema Breast: denies any pain (+) palpable right breast lump All other systems were reviewed with the patient and are negative.  I have reviewed the past medical history, past surgical history, social history and family history with the patient and they are unchanged from previous note.  ALLERGIES:  is allergic to penicillins and amoxicillin.  MEDICATIONS:  Current  Outpatient Medications  Medication Sig Dispense Refill  . amLODipine (NORVASC) 10 MG tablet Take 10 mg by mouth daily.     Marland Kitchen Bioflavonoid Products (ESTER C PO) Take by mouth.    Marland Kitchen BLACK CURRANT  SEED OIL PO Take by mouth.    . calcium-vitamin D (OSCAL WITH D) 500-200 MG-UNIT tablet Take 1 tablet by mouth 2 (two) times daily with a meal. 180 tablet 0  . Cholecalciferol (VITAMIN D PO) Take 5,000 Units by mouth 2 (two) times daily.     . Fish Oil OIL by Does not apply route.    . Flaxseed Oil OIL by Does not apply route.    Marland Kitchen goserelin (ZOLADEX) 3.6 MG injection Inject 3.6 mg into the skin every 28 (twenty-eight) days.    Marland Kitchen letrozole (FEMARA) 2.5 MG tablet Take 1 tablet (2.5 mg total) by mouth daily. 90 tablet 1  . lidocaine-prilocaine (EMLA) cream Apply 1 application topically as needed. Apply 1 hr prior to zoladex injection. 30 g 0  . Misc Natural Products (DANDELION ROOT PO) Take by mouth.    . potassium chloride SA (K-DUR,KLOR-CON) 20 MEQ tablet Take 1 tablet (20 mEq total) by mouth 2 (two) times daily. 30 tablet 3  . [place], Absinthium, Oil OIL by Does not apply route.     No current facility-administered medications for this visit.     PHYSICAL EXAMINATION: ECOG PERFORMANCE STATUS: 1 - Symptomatic but completely ambulatory  Vitals:   01/15/19 1556  BP: (!) 165/104  Pulse: 82  Resp: 18  Temp: 98.6 F (37 C)  SpO2: 97%   Filed Weights   01/15/19 1556  Weight: 141 lb 11.2 oz (64.3 kg)    GENERAL: alert, no distress and comfortable SKIN: skin color, texture, turgor are normal, no rashes or significant lesions EYES: normal, Conjunctiva are pink and non-injected, sclera clear OROPHARYNX: no exudate, no erythema and lips, buccal mucosa, and tongue normal  NECK: supple, thyroid normal size, non-tender, without nodularity LYMPH: no palpable lymphadenopathy in the cervical, axillary or inguinal LUNGS: clear to auscultation and percussion with normal breathing effort HEART: regular rate & rhythm and no murmurs and no lower extremity edema ABDOMEN: abdomen soft, non-tender and normal bowel sounds MUSCULOSKELETAL: no cyanosis of digits and no clubbing  NEURO: alert &  oriented x 3 with fluent speech, no focal motor/sensory deficits EXTREMITIES: No lower extremity edema BREAST: Right chest wall masses continuing to improve (exam performed in the presence of a chaperone)     LABORATORY DATA:  I have reviewed the data as listed CMP Latest Ref Rng & Units 08/13/2018 06/16/2018 05/14/2018  Glucose 70 - 99 mg/dL 89 98 89  BUN 6 - 20 mg/dL _0 Creatinine 0.44 - 1.00 mg/dL 0.80 0.87 0.82  Sodium 135 - 145 mmol/L 144 142 141  Potassium 3.5 - 5.1 mmol/L 3.6 3.2(L) 3.4(L)  Chloride 98 - 111 mmol/L 106 103 107  CO2 22 - 32 mmol/L _1 Calcium 8.9 - 10.3 mg/dL 10.0 9.7 9.6  Total Protein 6.5 - 8.1 g/dL 7.6 7.6 7.4  Total Bilirubin 0.3 - 1.2 mg/dL 0.5 0.6 0.5  Alkaline Phos 38 - 126 U/L 129(H) 134(H) 135(H)  AST 15 - 41 U/L _2 ALT 0 - 44 U/L _3 Lab Results  Component Value Date   WBC 5.6 01/15/2019   HGB 13.8 01/15/2019   HCT 40.9 01/15/2019   MCV 88.7 01/15/2019   PLT  253 01/15/2019   NEUTROABS 2.9 01/15/2019    ASSESSMENT & PLAN:  Breast cancer of upper-outer quadrant of right female breast ([place]) Recurrent right breast cancer initially DCIS treated with mastectomy followed by reconstruction and later developed chest wall recurrence in 2013 ER/PR positive HER-2 negative, patient underwent alternative therapies in [place] but could not afford the trips and hence she has not been on any breast cancer therapy for a long time.  PET/CT scan 11/12/2014 did not show metastatic disease but showed extensive involvement in the right breast in an around the implant Stage 3B  Right mastectomy 08/29/2015 at [place] (Dr.Singh): IDC with invol of skin and skin ulceration, breast capsule inv cancer, superior medial margin positive, 1/1 LN positive, grade 3, 14.3 cm, 3.5 cm, ALI, chest wall involv, ER 90-100%, PR 80-90%, HER-2 negative, Ki 67 40-50% T4CN1 (St 3B)  Treatment plan:(Patient is premenopausal based on blood work done May 2017,  estradiol 250, FSH 5.4) 1. Patient started tamoxifen but developed severe headache with severe hypertension and tamoxifen was discontinued 03/17/2016. 2. 02/2017 started on Exemestane, Zoladex started 05/29/2017, she does have some joint aches and pains  CT CAP 07/14/2018: Chest wall recurrence and metastatic right axillary lymphadenopathy similar to previous study.  Several bone lesions similar.  Multiple pulmonary nodules 5 mm or less favored benign  Plan: Continue with current therapy rescan in 3 months Monthly Zoladex injections will be continued. If the tumor shrink dramatically we may consider surgery at some point in the future. Plan to obtain scans in 6 months.    Orders Placed This Encounter  Procedures  . CBC with Differential (Cancer Center Only)    Standing Status:   Future    Number of Occurrences:   1    Standing Expiration Date:   01/15/2020  . CMP ([place] only)    Standing Status:   Future    Number of Occurrences:   1    Standing Expiration Date:   01/15/2020   The patient has a good understanding of the overall plan. she agrees with it. she will call with any problems that may develop before the next visit here.  Nicholas Lose, MD 01/15/2019  Julious Oka Dorshimer am acting as scribe for Dr. Nicholas Lose.  I have reviewed the above documentation for accuracy and completeness, and I agree with the above.

## 2019-01-15 ENCOUNTER — Inpatient Hospital Stay (HOSPITAL_BASED_OUTPATIENT_CLINIC_OR_DEPARTMENT_OTHER): Payer: Medicare HMO | Admitting: Hematology and Oncology

## 2019-01-15 ENCOUNTER — Other Ambulatory Visit: Payer: Self-pay

## 2019-01-15 ENCOUNTER — Inpatient Hospital Stay: Payer: Medicare HMO

## 2019-01-15 ENCOUNTER — Inpatient Hospital Stay: Payer: Medicare HMO | Attending: Hematology and Oncology

## 2019-01-15 VITALS — BP 165/104 | HR 82 | Temp 98.6°F | Resp 18 | Ht 65.0 in | Wt 141.7 lb

## 2019-01-15 DIAGNOSIS — Z9011 Acquired absence of right breast and nipple: Secondary | ICD-10-CM | POA: Insufficient documentation

## 2019-01-15 DIAGNOSIS — Z79811 Long term (current) use of aromatase inhibitors: Secondary | ICD-10-CM | POA: Diagnosis not present

## 2019-01-15 DIAGNOSIS — Z79899 Other long term (current) drug therapy: Secondary | ICD-10-CM | POA: Diagnosis not present

## 2019-01-15 DIAGNOSIS — C50411 Malignant neoplasm of upper-outer quadrant of right female breast: Secondary | ICD-10-CM | POA: Diagnosis not present

## 2019-01-15 DIAGNOSIS — Z170 Estrogen receptor positive status [ER+]: Secondary | ICD-10-CM | POA: Diagnosis not present

## 2019-01-15 DIAGNOSIS — I10 Essential (primary) hypertension: Secondary | ICD-10-CM | POA: Diagnosis not present

## 2019-01-15 LAB — CMP (CANCER CENTER ONLY)
ALT: 13 U/L (ref 0–44)
AST: 16 U/L (ref 15–41)
Albumin: 4 g/dL (ref 3.5–5.0)
Alkaline Phosphatase: 135 U/L — ABNORMAL HIGH (ref 38–126)
Anion gap: 11 (ref 5–15)
BUN: 14 mg/dL (ref 6–20)
CO2: 26 mmol/L (ref 22–32)
Calcium: 9.7 mg/dL (ref 8.9–10.3)
Chloride: 107 mmol/L (ref 98–111)
Creatinine: 0.87 mg/dL (ref 0.44–1.00)
GFR, Est AFR Am: >60 mL/min (ref >60)
GFR, Estimated: >60 mL/min (ref >60)
Glucose, Bld: 103 mg/dL — ABNORMAL HIGH (ref 70–99)
Potassium: 3.4 mmol/L — ABNORMAL LOW (ref 3.5–5.1)
Sodium: 144 mmol/L (ref 135–145)
Total Bilirubin: 0.5 mg/dL (ref 0.3–1.2)
Total Protein: 7.6 g/dL (ref 6.5–8.1)

## 2019-01-15 LAB — CBC WITH DIFFERENTIAL (CANCER CENTER ONLY)
Abs Immature Granulocytes: 0.01 10*3/uL (ref 0.00–0.07)
Basophils Absolute: 0 10*3/uL (ref 0.0–0.1)
Basophils Relative: 0 %
Eosinophils Absolute: 0 10*3/uL (ref 0.0–0.5)
Eosinophils Relative: 1 %
HCT: 40.9 % (ref 36.0–46.0)
Hemoglobin: 13.8 g/dL (ref 12.0–15.0)
Immature Granulocytes: 0 %
Lymphocytes Relative: 42 %
Lymphs Abs: 2.3 10*3/uL (ref 0.7–4.0)
MCH: 29.9 pg (ref 26.0–34.0)
MCHC: 33.7 g/dL (ref 30.0–36.0)
MCV: 88.7 fL (ref 80.0–100.0)
Monocytes Absolute: 0.3 10*3/uL (ref 0.1–1.0)
Monocytes Relative: 6 %
Neutro Abs: 2.9 10*3/uL (ref 1.7–7.7)
Neutrophils Relative %: 51 %
Platelet Count: 253 10*3/uL (ref 150–400)
RBC: 4.61 MIL/uL (ref 3.87–5.11)
RDW: 12.3 % (ref 11.5–15.5)
WBC Count: 5.6 10*3/uL (ref 4.0–10.5)
nRBC: 0 % (ref 0.0–0.2)

## 2019-01-15 MED ORDER — GOSERELIN ACETATE 3.6 MG ~~LOC~~ IMPL
DRUG_IMPLANT | SUBCUTANEOUS | Status: AC
  Filled 2019-01-15: qty 3.6

## 2019-01-15 MED ORDER — GOSERELIN ACETATE 3.6 MG ~~LOC~~ IMPL
3.6000 mg | DRUG_IMPLANT | Freq: Once | SUBCUTANEOUS | Status: AC
  Administered 2019-01-15: 3.6 mg via SUBCUTANEOUS

## 2019-01-15 NOTE — Patient Instructions (Signed)
Goserelin injection What is this medicine? GOSERELIN (GOE se rel in) is similar to a hormone found in the body. It lowers the amount of sex hormones that the body makes. Men will have lower testosterone levels and women will have lower estrogen levels while taking this medicine. In men, this medicine is used to treat prostate cancer; the injection is either given once per month or once every 12 weeks. A once per month injection (only) is used to treat women with endometriosis, dysfunctional uterine bleeding, or advanced breast cancer. This medicine may be used for other purposes; ask your health care provider or pharmacist if you have questions. COMMON BRAND NAME(S): Zoladex What should I tell my health care provider before I take this medicine? They need to know if you have any of these conditions (some only apply to women): -diabetes -heart disease or previous heart attack -high blood pressure -high cholesterol -kidney disease -osteoporosis or low bone density -problems passing urine -spinal cord injury -stroke -tobacco smoker -an unusual or allergic reaction to goserelin, hormone therapy, other medicines, foods, dyes, or preservatives -pregnant or trying to get pregnant -breast-feeding How should I use this medicine? This medicine is for injection under the skin. It is given by a health care professional in a hospital or clinic setting. Men receive this injection once every 4 weeks or once every 12 weeks. Women will only receive the once every 4 weeks injection. Talk to your pediatrician regarding the use of this medicine in children. Special care may be needed. Overdosage: If you think you have taken too much of this medicine contact a poison control center or emergency room at once. NOTE: This medicine is only for you. Do not share this medicine with others. What if I miss a dose? It is important not to miss your dose. Call your doctor or health care professional if you are unable to  keep an appointment. What may interact with this medicine? -female hormones like estrogen -herbal or dietary supplements like black cohosh, chasteberry, or DHEA -female hormones like testosterone -prasterone This list may not describe all possible interactions. Give your health care provider a list of all the medicines, herbs, non-prescription drugs, or dietary supplements you use. Also tell them if you smoke, drink alcohol, or use illegal drugs. Some items may interact with your medicine. What should I watch for while using this medicine? Visit your doctor or health care professional for regular checks on your progress. Your symptoms may appear to get worse during the first weeks of this therapy. Tell your doctor or healthcare professional if your symptoms do not start to get better or if they get worse after this time. Your bones may get weaker if you take this medicine for a long time. If you smoke or frequently drink alcohol you may increase your risk of bone loss. A family history of osteoporosis, chronic use of drugs for seizures (convulsions), or corticosteroids can also increase your risk of bone loss. Talk to your doctor about how to keep your bones strong. This medicine should stop regular monthly menstration in women. Tell your doctor if you continue to menstrate. Women should not become pregnant while taking this medicine or for 12 weeks after stopping this medicine. Women should inform their doctor if they wish to become pregnant or think they might be pregnant. There is a potential for serious side effects to an unborn child. Talk to your health care professional or pharmacist for more information. Do not breast-feed an infant while taking   this medicine. Men should inform their doctors if they wish to father a child. This medicine may lower sperm counts. Talk to your health care professional or pharmacist for more information. What side effects may I notice from receiving this  medicine? Side effects that you should report to your doctor or health care professional as soon as possible: -allergic reactions like skin rash, itching or hives, swelling of the face, lips, or tongue -bone pain -breathing problems -changes in vision -chest pain -feeling faint or lightheaded, falls -fever, chills -pain, swelling, warmth in the leg -pain, tingling, numbness in the hands or feet -signs and symptoms of low blood pressure like dizziness; feeling faint or lightheaded, falls; unusually weak or tired -stomach pain -swelling of the ankles, feet, hands -trouble passing urine or change in the amount of urine -unusually high or low blood pressure -unusually weak or tired Side effects that usually do not require medical attention (report to your doctor or health care professional if they continue or are bothersome): -change in sex drive or performance -changes in breast size in both males and females -changes in emotions or moods -headache -hot flashes -irritation at site where injected -loss of appetite -skin problems like acne, dry skin -vaginal dryness This list may not describe all possible side effects. Call your doctor for medical advice about side effects. You may report side effects to FDA at 1-800-FDA-1088. Where should I keep my medicine? This drug is given in a hospital or clinic and will not be stored at home. NOTE: This sheet is a summary. It may not cover all possible information. If you have questions about this medicine, talk to your doctor, pharmacist, or health care provider.  2018 Elsevier/Gold Standard (2013-12-29 11:10:35)  

## 2019-01-15 NOTE — Assessment & Plan Note (Signed)
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  PET/CT scan 11/12/2014 did not show metastatic disease but showed extensive involvement in the right breast in an around the implant Stage 3B  Right mastectomy 08/29/2015 at [facility] (Dr.Singh): IDC with invol of skin and skin ulceration, breast capsule inv cancer, superior medial margin positive, 1/1 LN positive, grade 3, 14.3 cm, 3.5 cm, ALI, chest wall involv, ER 90-100%, PR 80-90%, HER-2 negative, Ki 67 40-50% T4CN1 (St 3B)  Treatment plan:(Patient is premenopausal based on blood work done May 2017, estradiol 250, FSH 5.4) 1. Patient started tamoxifen but developed severe headache with severe hypertension and tamoxifen was discontinued 03/17/2016. 2. 02/2017 started on Exemestane, Zoladex started 05/29/2017, she does have some joint aches and pains  CT CAP 07/14/2018: Chest wall recurrence and metastatic right axillary lymphadenopathy similar to previous study.  Several bone lesions similar.  Multiple pulmonary nodules 5 mm or less favored benign  Plan: Continue with current therapy rescan in 3 months Plan to obtain scans in 6 months.

## 2019-02-13 ENCOUNTER — Telehealth: Payer: Self-pay | Admitting: *Deleted

## 2019-02-13 ENCOUNTER — Encounter: Payer: Self-pay | Admitting: General Practice

## 2019-02-13 NOTE — Telephone Encounter (Signed)
Pt called stating she has Zoladex injection at 3 pm on Monday 02/16/19.  Pt was concerned about going out for this - pt does not want to go out at present.  Wanted to know what she should do. Informed pt that message will be relayed to Dr. Jana Hakim and Tivis Ringer, desk nurse.  Pt understood that she will be contacted with further instructions by desk nurse on Monday am . Pt's   Phone   [phone].

## 2019-02-13 NOTE — Progress Notes (Signed)
[facility] Support Services   [facility] Support Services Team contacted patient to assess for food insecurity and other psychosocial needs during current COVID19 pandemic.  No answer, left VM.  Giovan Pinsky C Earl Losee, LCSW  [facility]        

## 2019-02-16 ENCOUNTER — Inpatient Hospital Stay: Payer: Medicare HMO

## 2019-02-16 ENCOUNTER — Telehealth: Payer: Self-pay | Admitting: Hematology and Oncology

## 2019-02-16 ENCOUNTER — Telehealth: Payer: Self-pay

## 2019-02-16 NOTE — Telephone Encounter (Signed)
Nurse spoke with patient regarding injection appointment for Zoladex.  Nurse education patient on importance of keeping this appointment due to being on aromatase inhibitor. Pt voiced understanding.  Is unable to come today due to transportation but willing able to reschedule for later this week.  Scheduling message sent.  No further needs.

## 2019-02-16 NOTE — Telephone Encounter (Signed)
R/s appt per 4/13 sch message - unable to reach patient - left message with appt date and time

## 2019-02-17 ENCOUNTER — Other Ambulatory Visit: Payer: Self-pay | Admitting: Oncology

## 2019-02-17 ENCOUNTER — Inpatient Hospital Stay: Payer: Medicare HMO | Attending: Hematology and Oncology

## 2019-02-17 ENCOUNTER — Telehealth: Payer: Self-pay | Admitting: General Practice

## 2019-02-17 NOTE — Telephone Encounter (Signed)
[place] CSW Progress Notes  PT returned call from [place], states she would like to talk about needs related to current shelter in place requirements.  CSW returned call, left VM w my contact information.  Edwyna Shell, LCSW Clinical Social Worker Phone:  [phone]

## 2019-03-04 ENCOUNTER — Telehealth: Payer: Self-pay | Admitting: *Deleted

## 2019-03-04 NOTE — Telephone Encounter (Signed)
Medical records faxed to [place]; [place] [phone]

## 2019-03-16 ENCOUNTER — Other Ambulatory Visit: Payer: Self-pay | Admitting: Hematology and Oncology

## 2019-03-18 ENCOUNTER — Inpatient Hospital Stay: Payer: Medicare HMO | Attending: Hematology and Oncology

## 2019-04-13 ENCOUNTER — Telehealth: Payer: Self-pay | Admitting: *Deleted

## 2019-04-13 ENCOUNTER — Other Ambulatory Visit: Payer: Self-pay | Admitting: Hematology and Oncology

## 2019-04-13 DIAGNOSIS — Z170 Estrogen receptor positive status [ER+]: Secondary | ICD-10-CM

## 2019-04-13 DIAGNOSIS — C50411 Malignant neoplasm of upper-outer quadrant of right female breast: Secondary | ICD-10-CM

## 2019-04-13 NOTE — Assessment & Plan Note (Deleted)
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  PET/CT scan 11/12/2014 did not show metastatic disease but showed extensive involvement in the right breast in an around the implant Stage 3B  Right mastectomy 08/29/2015 at [place] (Dr.Singh): IDC with invol of skin and skin ulceration, breast capsule inv cancer, superior medial margin positive, 1/1 LN positive, grade 3, 14.3 cm, 3.5 cm, ALI, chest wall involv, ER 90-100%, PR 80-90%, HER-2 negative, Ki 67 40-50% T4CN1 (St 3B)  Treatment plan:(Patient is premenopausal based on blood work done May 2017, estradiol 250, FSH 5.4) 1. Patient started tamoxifen but developed severe headache with severe hypertension and tamoxifen was discontinued 03/17/2016. 2. 02/2017 started on Exemestane, Zoladex started 05/29/2017, she does have some joint aches and pains  CT CAP 07/14/2018: Chest wall recurrence and metastatic right axillary lymphadenopathy similar to previous study. Several bone lesions similar. Multiple pulmonary nodules 5 mm or less favored benign  Plan: Continue with current therapy rescan in49month Monthly Zoladex injections will be continued. If the tumor shrink dramatically we may consider surgery at some point in the future. Plan to obtain scans in 6 months.

## 2019-04-13 NOTE — Telephone Encounter (Signed)
Called pt and LVM regarding apt for  CT abdomen/pelvis/chest scheduled 07/14/2019 at 9 am.  Pt will need to be NPO x 4 hours prior.  At her injection apt on 04/20/19 she will need to pick up two bottles of contrast and will drink them the morning of her apt on 9/8 at 7 am and 8am.

## 2019-04-20 ENCOUNTER — Inpatient Hospital Stay: Payer: Medicare HMO | Attending: Hematology and Oncology

## 2019-04-20 ENCOUNTER — Inpatient Hospital Stay: Payer: Medicare HMO

## 2019-04-20 ENCOUNTER — Inpatient Hospital Stay: Payer: Medicare HMO | Admitting: Hematology and Oncology

## 2019-04-24 ENCOUNTER — Telehealth: Payer: Self-pay

## 2019-04-24 NOTE — Telephone Encounter (Signed)
Pt called regarding recent missed appts. Pt states she lives 2 hrs away and does not have transportation.   A telephone appt has been set up with Dr Lindi Adie - pt would like to discuss what her treatment options are.

## 2019-05-04 NOTE — Progress Notes (Signed)
HEMATOLOGY-ONCOLOGY TELEPHONE VISIT PROGRESS NOTE  I connected with [name] on 05/05/2019 at  3:15 PM EDT by telephone and verified that I am speaking with the correct person using two identifiers.  I discussed the limitations, risks, security and privacy concerns of performing an evaluation and management service by telephone and the availability of in person appointments.  I also discussed with the patient that there may be a patient responsible charge related to this service. The patient expressed understanding and agreed to proceed.   History of Present Illness: Misty Arnold is a 48 y.o. female with above-mentioned history of metastatic breast cancer who is currently on Zoladex with letrozole. She presents over the phone today for follow-up.   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; patient went to cancer treatment centers of [place] for second opinion and delayed therapy   01/26/2011 Surgery   right mastectomy followed by reconstruction: 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 But she could not afford these trips.   01/08/2014 Breast MRI   right breast multiple enhancing masses within the soft tissues largest 5.6 cm in wall skin surface and the capsule of the  silicone prosthesis, contiguous nodules involving in inferomedial breast and tired and subcentimeter nodules across the midline   08/29/2015 Surgery   Right mastectomy: IDC with invol of skin and skin ulceration, breast capsule inv cancer, superior medial margin positive, 1/1 LN positive, grade 3, 14.3 cm, 3.5 cm, ALI, chest wall involv, ER 90-100%, PR 80-90%, HER-2 negative, Ki 67 40-50% T4CN1 (St 3B)   03/05/2016 -  Anti-estrogen oral therapy   Tamoxifen 20 mg daily stopped due to headache and uncontrolled hypertension. Decrease to 10 mg daily 04/03/2016, stopped June 2017 and took an estrogen metabolizer over-the-counter; started Aromasin April 2018 from a [place] physician, Zoladex started on 05/2017 and switched to Letrozole in 09/2017     Observations/Objective:     Assessment Plan:  Breast cancer of upper-outer quadrant of right female breast ([place]) Recurrent right breast cancer initially DCIS treated with mastectomy followed by reconstruction and later developed chest wall recurrence in 2013 ER/PR positive HER-2 negative, patient underwent alternative therapies in [place] but could not afford the trips and hence she has not been on any breast cancer therapy for a long time.  PET/CT scan 11/12/2014 did not show metastatic disease but showed extensive involvement in the right breast in an around the implant Stage 3B  Right mastectomy 08/29/2015 at [place] (Dr.Singh): IDC with invol of skin and skin ulceration, breast capsule inv cancer, superior medial margin positive, 1/1 LN positive, grade 3, 14.3 cm, 3.5 cm, ALI, chest wall involv, ER 90-100%, PR 80-90%, HER-2 negative, Ki 67 40-50% T4CN1 (St 3B)  Treatment plan:(Patient is premenopausal based on blood work done May 2017, estradiol 250, FSH 5.4) 1. Patient started tamoxifen but developed severe headache  with severe hypertension and tamoxifen was discontinued 03/17/2016. 2. 02/2017 started on Exemestane, Zoladex started 05/29/2017, she  does have some joint aches and pains  CT CAP 07/14/2018: Chest wall recurrence and metastatic right axillary lymphadenopathy similar to previous study. Several bone lesions similar. Multiple pulmonary nodules 5 mm or less favored benign  Plan: Continue with current therapy rescan in2month Monthly Zoladex injections will be continued. Unfortunately patient has not been able to find a ride to bring her here for the injections.  She has not been able to come for injections because of lack of transportation.  She will try to rent a car and come down to get her injection. I reminded her that she has scan appointment September 8.  If the tumor shrink dramatically we may consider surgery at some point in the future. Plan to obtain scans in 3 months and follow-up after that.    I discussed the assessment and treatment plan with the patient. The patient was provided an opportunity to ask questions and all were answered. The patient agreed with the plan and demonstrated an understanding of the instructions. The patient was advised to call back or seek an in-person evaluation if the symptoms worsen or if the condition fails to improve as anticipated.   I provided 12 minutes of non-face-to-face time during this encounter.   VRulon Eisenmenger MD 05/05/2019    I, Molly Dorshimer, am acting as scribe for VNicholas Lose MD.  I have reviewed the above documentation for accuracy and completeness, and I agree with the above.

## 2019-05-05 ENCOUNTER — Inpatient Hospital Stay (HOSPITAL_BASED_OUTPATIENT_CLINIC_OR_DEPARTMENT_OTHER): Payer: Medicare HMO | Admitting: Hematology and Oncology

## 2019-05-05 DIAGNOSIS — C50411 Malignant neoplasm of upper-outer quadrant of right female breast: Secondary | ICD-10-CM | POA: Diagnosis not present

## 2019-05-05 DIAGNOSIS — Z9011 Acquired absence of right breast and nipple: Secondary | ICD-10-CM | POA: Diagnosis not present

## 2019-05-05 DIAGNOSIS — Z170 Estrogen receptor positive status [ER+]: Secondary | ICD-10-CM | POA: Diagnosis not present

## 2019-05-05 DIAGNOSIS — Z79811 Long term (current) use of aromatase inhibitors: Secondary | ICD-10-CM

## 2019-05-05 NOTE — Assessment & Plan Note (Signed)
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  PET/CT scan 11/12/2014 did not show metastatic disease but showed extensive involvement in the right breast in an around the implant Stage 3B  Right mastectomy 08/29/2015 at [place] (Dr.Singh): IDC with invol of skin and skin ulceration, breast capsule inv cancer, superior medial margin positive, 1/1 LN positive, grade 3, 14.3 cm, 3.5 cm, ALI, chest wall involv, ER 90-100%, PR 80-90%, HER-2 negative, Ki 67 40-50% T4CN1 (St 3B)  Treatment plan:(Patient is premenopausal based on blood work done May 2017, estradiol 250, FSH 5.4) 1. Patient started tamoxifen but developed severe headache with severe hypertension and tamoxifen was discontinued 03/17/2016. 2. 02/2017 started on Exemestane, Zoladex started 05/29/2017, she does have some joint aches and pains  CT CAP 07/14/2018: Chest wall recurrence and metastatic right axillary lymphadenopathy similar to previous study. Several bone lesions similar. Multiple pulmonary nodules 5 mm or less favored benign  Plan: Continue with current therapy rescan in49month Monthly Zoladex injections will be continued. If the tumor shrink dramatically we may consider surgery at some point in the future. Plan to obtain scans in 6 months.

## 2019-05-06 ENCOUNTER — Telehealth: Payer: Self-pay | Admitting: Hematology and Oncology

## 2019-05-06 NOTE — Telephone Encounter (Signed)
Per 6/30 los patient will call  To schedule injections

## 2019-06-16 ENCOUNTER — Other Ambulatory Visit: Payer: Self-pay | Admitting: *Deleted

## 2019-06-16 ENCOUNTER — Inpatient Hospital Stay: Payer: Medicare HMO | Attending: Hematology and Oncology

## 2019-06-16 ENCOUNTER — Other Ambulatory Visit: Payer: Self-pay

## 2019-06-16 VITALS — BP 159/90 | HR 95 | Temp 99.1°F | Resp 16

## 2019-06-16 DIAGNOSIS — C50411 Malignant neoplasm of upper-outer quadrant of right female breast: Secondary | ICD-10-CM | POA: Insufficient documentation

## 2019-06-16 DIAGNOSIS — C792 Secondary malignant neoplasm of skin: Secondary | ICD-10-CM | POA: Diagnosis not present

## 2019-06-16 DIAGNOSIS — Z9011 Acquired absence of right breast and nipple: Secondary | ICD-10-CM | POA: Diagnosis not present

## 2019-06-16 DIAGNOSIS — Z170 Estrogen receptor positive status [ER+]: Secondary | ICD-10-CM

## 2019-06-16 DIAGNOSIS — Z79811 Long term (current) use of aromatase inhibitors: Secondary | ICD-10-CM | POA: Diagnosis not present

## 2019-06-16 MED ORDER — LIDOCAINE-PRILOCAINE 2.5-2.5 % EX CREA: 1.0000 "application " | TOPICAL_CREAM | CUTANEOUS | 0 refills | Status: DC | PRN

## 2019-06-16 MED ORDER — LETROZOLE 2.5 MG PO TABS: 2.5000 mg | ORAL_TABLET | Freq: Every day | ORAL | 1 refills | Status: DC

## 2019-06-16 MED ORDER — GOSERELIN ACETATE 3.6 MG ~~LOC~~ IMPL
3.6000 mg | DRUG_IMPLANT | Freq: Once | SUBCUTANEOUS | Status: AC
  Administered 2019-06-16: 3.6 mg via SUBCUTANEOUS

## 2019-06-16 MED ORDER — GOSERELIN ACETATE 3.6 MG ~~LOC~~ IMPL
DRUG_IMPLANT | SUBCUTANEOUS | Status: AC
  Filled 2019-06-16: qty 3.6

## 2019-06-16 MED FILL — LIDOCAINE-PRILOCAINE CREAM: 2.5-2.5 | 10 days supply | Qty: 30 | Fill #0

## 2019-06-16 MED FILL — LETROZOLE 2.5 MG TABLET: 2.5 | 90 days supply | Qty: 90 | Fill #0

## 2019-06-16 NOTE — Patient Instructions (Signed)

## 2019-07-14 ENCOUNTER — Ambulatory Visit (HOSPITAL_COMMUNITY): Admission: RE | Admit: 2019-07-14 | Payer: Medicare HMO | Source: Ambulatory Visit

## 2019-07-14 ENCOUNTER — Telehealth: Payer: Self-pay | Admitting: *Deleted

## 2019-07-14 ENCOUNTER — Telehealth: Payer: Self-pay | Admitting: Hematology and Oncology

## 2019-07-14 NOTE — Telephone Encounter (Signed)
Returned patient's phone call regarding rescheduling 09/10 appointment, patient will call back when ready to reschedule.

## 2019-07-14 NOTE — Telephone Encounter (Signed)
RN placed call to pt regarding missed apt today for CT abdomen/pelvis.  Pt states she lives 1.5 hours away and has trouble finding transportation.  Pt states she has the number for scheduling and will call to see available days to try and find transportation.  RN instructed pt to schedule apt early in the morning to allow some time for results to come back and then she can see Dr. Lindi Adie the same day and have her Zoladex injection the same day as well.  Pt verbalized understanding and will call the office to reschedule her apt.

## 2019-07-14 NOTE — Telephone Encounter (Signed)
Received call from radiology stating pt did not show for her CT abdomen/pelvis today.  RN attempt x1 to call pt to reschedule, no answer, LVM to return call to the office.

## 2019-07-16 ENCOUNTER — Ambulatory Visit: Payer: Medicare HMO | Admitting: Hematology and Oncology

## 2019-07-22 ENCOUNTER — Telehealth: Payer: Self-pay | Admitting: *Deleted

## 2019-07-22 NOTE — Telephone Encounter (Signed)
Received call from pt stating she will not have a ride for her CT abdomen/pelvis/contrast on Friday.  Apt canceled and pt given central scheduling phone number to get apt re scheduled when she is able to find a ride.

## 2019-07-23 ENCOUNTER — Ambulatory Visit (HOSPITAL_COMMUNITY): Payer: Medicare HMO

## 2019-07-23 ENCOUNTER — Other Ambulatory Visit: Payer: Self-pay

## 2019-07-23 ENCOUNTER — Inpatient Hospital Stay: Payer: Medicare HMO | Attending: Hematology and Oncology

## 2019-07-23 ENCOUNTER — Telehealth: Payer: Self-pay | Admitting: *Deleted

## 2019-07-23 DIAGNOSIS — C50411 Malignant neoplasm of upper-outer quadrant of right female breast: Secondary | ICD-10-CM | POA: Insufficient documentation

## 2019-07-23 DIAGNOSIS — Z170 Estrogen receptor positive status [ER+]: Secondary | ICD-10-CM

## 2019-07-23 MED ORDER — GOSERELIN ACETATE 3.6 MG ~~LOC~~ IMPL
DRUG_IMPLANT | SUBCUTANEOUS | Status: AC
  Filled 2019-07-23: qty 3.6

## 2019-07-23 MED ORDER — GOSERELIN ACETATE 3.6 MG ~~LOC~~ IMPL
3.6000 mg | DRUG_IMPLANT | Freq: Once | SUBCUTANEOUS | Status: AC
  Administered 2019-07-23: 3.6 mg via SUBCUTANEOUS

## 2019-07-23 NOTE — Patient Instructions (Signed)

## 2019-07-23 NOTE — Telephone Encounter (Signed)
RN placed call to pt and talked with pt regarding of the possibility of scheduling pt CT abdomen/ pelvis/chest at [facility] considering the pt has trouble arranging transportation.   Pt stated she would think about it and get back to [name].

## 2019-07-24 ENCOUNTER — Ambulatory Visit (HOSPITAL_COMMUNITY): Admission: RE | Admit: 2019-07-24 | Payer: Medicare HMO | Source: Ambulatory Visit

## 2019-07-24 ENCOUNTER — Other Ambulatory Visit (HOSPITAL_COMMUNITY): Payer: Medicare HMO

## 2019-07-27 ENCOUNTER — Telehealth: Payer: Self-pay

## 2019-07-27 NOTE — Telephone Encounter (Signed)
RN returned call, voicemail left for return call.  

## 2019-08-10 ENCOUNTER — Telehealth: Payer: Self-pay | Admitting: Hematology and Oncology

## 2019-08-10 NOTE — Telephone Encounter (Signed)
Scheduled appt per 10/2 sch message - unable to reach pt . Left message with appt date and time

## 2019-08-11 ENCOUNTER — Telehealth: Payer: Self-pay | Admitting: *Deleted

## 2019-08-11 NOTE — Telephone Encounter (Signed)
Attempt x1 to reach pt regarding apt schedule.  LVM stating injection apt on 10/16 at 3pm.  Pt has difficulty with transportation and had to cancel CT several times, RN also left number for central scheduling for pt to call to set up CT for date and time that is convenient for her.

## 2019-08-21 ENCOUNTER — Other Ambulatory Visit: Payer: Self-pay | Admitting: Hematology and Oncology

## 2019-08-21 ENCOUNTER — Other Ambulatory Visit: Payer: Self-pay

## 2019-08-21 ENCOUNTER — Inpatient Hospital Stay: Payer: Medicare HMO | Attending: Hematology and Oncology

## 2019-08-21 ENCOUNTER — Ambulatory Visit (HOSPITAL_COMMUNITY)
Admission: RE | Admit: 2019-08-21 | Discharge: 2019-08-21 | Disposition: A | Discharge status: Home / Self Care | Payer: Medicare HMO | Source: Ambulatory Visit | Attending: Hematology and Oncology | Admitting: Hematology and Oncology

## 2019-08-21 ENCOUNTER — Inpatient Hospital Stay (HOSPITAL_BASED_OUTPATIENT_CLINIC_OR_DEPARTMENT_OTHER): Payer: Medicare HMO | Admitting: Hematology and Oncology

## 2019-08-21 DIAGNOSIS — Z170 Estrogen receptor positive status [ER+]: Secondary | ICD-10-CM

## 2019-08-21 DIAGNOSIS — Z79899 Other long term (current) drug therapy: Secondary | ICD-10-CM | POA: Diagnosis not present

## 2019-08-21 DIAGNOSIS — R918 Other nonspecific abnormal finding of lung field: Secondary | ICD-10-CM | POA: Diagnosis not present

## 2019-08-21 DIAGNOSIS — C50411 Malignant neoplasm of upper-outer quadrant of right female breast: Secondary | ICD-10-CM | POA: Insufficient documentation

## 2019-08-21 DIAGNOSIS — M899 Disorder of bone, unspecified: Secondary | ICD-10-CM | POA: Insufficient documentation

## 2019-08-21 DIAGNOSIS — C773 Secondary and unspecified malignant neoplasm of axilla and upper limb lymph nodes: Secondary | ICD-10-CM | POA: Diagnosis not present

## 2019-08-21 DIAGNOSIS — Z9011 Acquired absence of right breast and nipple: Secondary | ICD-10-CM | POA: Insufficient documentation

## 2019-08-21 MED ORDER — GOSERELIN ACETATE 3.6 MG ~~LOC~~ IMPL
3.6000 mg | DRUG_IMPLANT | Freq: Once | SUBCUTANEOUS | Status: AC
  Administered 2019-08-21: 3.6 mg via SUBCUTANEOUS

## 2019-08-21 MED ORDER — SODIUM CHLORIDE (PF) 0.9 % IJ SOLN
INTRAMUSCULAR | Status: AC
  Filled 2019-08-21: qty 50

## 2019-08-21 MED ORDER — GOSERELIN ACETATE 3.6 MG ~~LOC~~ IMPL
DRUG_IMPLANT | SUBCUTANEOUS | Status: AC
  Filled 2019-08-21: qty 3.6

## 2019-08-21 MED ORDER — IOHEXOL 300 MG/ML  SOLN
100.0000 mL | Freq: Once | INTRAMUSCULAR | Status: AC | PRN
  Administered 2019-08-21: 100 mL via INTRAVENOUS

## 2019-08-21 NOTE — Patient Instructions (Signed)

## 2019-08-21 NOTE — Assessment & Plan Note (Signed)
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  PET/CT scan 11/12/2014 did not show metastatic disease but showed extensive involvement in the right breast in an around the implant Stage 3B  Right mastectomy 08/29/2015 at [place] (Dr.Singh): IDC with invol of skin and skin ulceration, breast capsule inv cancer, superior medial margin positive, 1/1 LN positive, grade 3, 14.3 cm, 3.5 cm, ALI, chest wall involv, ER 90-100%, PR 80-90%, HER-2 negative, Ki 67 40-50% T4CN1 (St 3B)  Treatment plan:(Patient is premenopausal based on blood work done May 2017, estradiol 250, FSH 5.4) 1. Patient started tamoxifen but developed severe headache with severe hypertension and tamoxifen was discontinued 03/17/2016. 2. 02/2017 started on Exemestane, Zoladex started 05/29/2017, she does have some joint aches and pains  CT CAP 07/14/2018: Chest wall recurrence and metastatic right axillary lymphadenopathy similar to previous study. Several bone lesions similar. Multiple pulmonary nodules 5 mm or less favored benign  Monthly Zoladex injections will be continued. She did not receive Zoladex injections from March until September because of lack of transportation.  She lives almost 2 hours away from here.  CT CAP will be performed today.  I will call her on Monday to discuss the results.

## 2019-08-21 NOTE — Progress Notes (Signed)
Patient Care Team: Patient, [name] as PCP - General (General Practice)  DIAGNOSIS:    ICD-10-CM   1. Malignant neoplasm of upper-outer quadrant of right breast in female, estrogen receptor positive ([place])  C50.411    Z17.0     SUMMARY OF ONCOLOGIC HISTORY: Oncology History  Breast cancer of upper-outer quadrant of right female breast ([place])  09/21/2010 Mammogram   right breast linear and segmental pleomorphic calcifications from 4:00 to 6:00 position ultrasound revealed 1.6 cm and 1.1 cm masses   09/27/2010 Initial Biopsy   ultrasound-guided biopsy of all masses showed DCIS grade 2; patient went to cancer treatment centers of [place] for second opinion and delayed therapy   01/26/2011 Surgery   right mastectomy followed by reconstruction: 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 But she could not afford these trips.   01/08/2014 Breast MRI   right breast multiple enhancing masses within the soft tissues largest 5.6 cm in wall skin surface and the capsule of the silicone prosthesis, contiguous nodules involving in inferomedial breast and tired and subcentimeter nodules across the midline   08/29/2015 Surgery   Right mastectomy: IDC with invol of skin and skin ulceration, breast capsule inv cancer, superior medial margin positive, 1/1 LN positive, grade 3, 14.3 cm, 3.5 cm, ALI, chest wall involv, ER 90-100%, PR 80-90%, HER-2 negative, Ki 67 40-50% T4CN1 (St 3B)   03/05/2016 -  Anti-estrogen oral therapy   Tamoxifen 20 mg daily stopped due to headache and  uncontrolled hypertension. Decrease to 10 mg daily 04/03/2016, stopped June 2017 and took an estrogen metabolizer over-the-counter; started Aromasin April 2018 from a [place] physician, Zoladex started on 05/2017 and switched to Letrozole in 09/2017     CHIEF COMPLIANT: Follow-up of metastatic breast cancer to review recent scans  INTERVAL HISTORY: Misty Arnold is a 48 y.o. with above-mentioned history of metastatic breast cancerwhois currently on Zoladex with letrozole.She presents to the clinic today for follow-up to review her scans.  She missed almost 6 months of Zoladex injections because of COVID-19 and the lack of transportation.  She lives almost 2 hours away from here and has difficulty with coming here.  She also has CT scans set up for today later in the day.  She reports no new or worsening symptoms.  She does have decreased weight from previous.  She tells me this is because she does not eat any sugary foods but wonders if she needs to eat certain kinds of foods to gain weight.  REVIEW OF SYSTEMS:   Constitutional: Denies fevers, chills, complaining of weight loss Eyes: Denies blurriness of vision Ears, nose, mouth, throat, and face: Denies mucositis or sore throat Respiratory: Denies cough, dyspnea or wheezes Cardiovascular: Denies palpitation, chest discomfort Gastrointestinal: Denies nausea, heartburn or change in bowel habits Skin: Denies abnormal skin rashes Lymphatics: Denies new lymphadenopathy or easy bruising Neurological: Denies numbness, tingling or new weaknesses Behavioral/Psych: Mood is stable, no new changes  Extremities: No lower extremity edema Breast: denies any pain or lumps or nodules in either breasts All other systems were reviewed with the patient and are negative.  I have reviewed the past medical history, past surgical history, social history and family history with the patient and they are unchanged from previous note.  ALLERGIES:  is allergic to  penicillins and amoxicillin.  MEDICATIONS:  Current Outpatient Medications  Medication Sig Dispense Refill  . amLODipine (NORVASC) 10 MG tablet Take 10 mg by mouth daily.     Marland Kitchen Bioflavonoid Products (ESTER C PO) Take by mouth.    Marland Kitchen BLACK CURRANT SEED OIL PO Take by mouth.    . calcium-vitamin D (OSCAL WITH D) 500-200 MG-UNIT tablet Take 1 tablet by mouth 2 (two) times daily with a meal. 180 tablet 0  . Cholecalciferol (VITAMIN D PO) Take 5,000 Units by mouth 2 (two) times daily.     . Fish Oil OIL by Does not apply route.    . Flaxseed Oil OIL by Does not apply route.    Marland Kitchen goserelin (ZOLADEX) 3.6 MG injection Inject 3.6 mg into the skin every 28 (twenty-eight) days.    Marland Kitchen letrozole (FEMARA) 2.5 MG tablet Take 1 tablet (2.5 mg total) by mouth daily. 90 tablet 1  . lidocaine-prilocaine (EMLA) cream Apply 1 application topically as needed. Apply 1 hr prior to zoladex injection. 30 g 0  . Misc Natural Products (DANDELION ROOT PO) Take by mouth.    . [place], Absinthium, Oil OIL by Does not apply route.     No current facility-administered medications for this visit.     PHYSICAL EXAMINATION: ECOG PERFORMANCE STATUS: 1 - Symptomatic but completely ambulatory  Vitals:   08/21/19 1133  BP: (!) 167/98  Pulse: 95  Resp: 17  Temp: 98 F (36.7 C)  SpO2: 100%   Filed Weights   08/21/19 1133  Weight: 120 lb 9.6 oz (54.7 kg)    GENERAL: alert, no distress and comfortable SKIN: skin color, texture, turgor are normal, no rashes or significant lesions EYES: normal, Conjunctiva are pink and non-injected, sclera clear OROPHARYNX: no exudate, no erythema and lips, buccal mucosa, and tongue normal  NECK: supple, thyroid normal size, non-tender, without nodularity LYMPH: no palpable lymphadenopathy in the cervical, axillary or inguinal LUNGS: clear to auscultation and percussion with normal breathing effort HEART: regular rate & rhythm and no murmurs and no lower extremity edema ABDOMEN:  abdomen soft, non-tender and normal bowel sounds MUSCULOSKELETAL: no cyanosis of digits and no clubbing  NEURO: alert & oriented x 3 with fluent speech, no focal motor/sensory deficits EXTREMITIES: No lower extremity edema  LABORATORY DATA:  I have reviewed the data as listed CMP Latest Ref Rng & Units 01/15/2019 08/13/2018 06/16/2018  Glucose 70 - 99 mg/dL 103(H) 89 98  BUN 6 - 20 mg/dL _0 Creatinine 0.44 - 1.00 mg/dL 0.87 0.80 0.87  Sodium 135 - 145 mmol/L 144 144 142  Potassium 3.5 - 5.1 mmol/L 3.4(L) 3.6 3.2(L)  Chloride 98 - 111 mmol/L 107 106 103  CO2 22 - 32 mmol/L _1 Calcium 8.9 - 10.3 mg/dL 9.7 10.0 9.7  Total Protein 6.5 - 8.1 g/dL 7.6 7.6 7.6  Total Bilirubin 0.3 - 1.2 mg/dL 0.5 0.5 0.6  Alkaline Phos 38 - 126 U/L 135(H) 129(H) 134(H)  AST 15 - 41 U/L _2 ALT 0 - 44 U/L _3 Lab Results  Component Value Date   WBC 5.6 01/15/2019   HGB 13.8 01/15/2019   HCT 40.9 01/15/2019   MCV 88.7 01/15/2019   PLT 253 01/15/2019   NEUTROABS  2.9 01/15/2019    ASSESSMENT & PLAN:  Breast cancer of upper-outer quadrant of right female breast ([place]) Recurrent right breast cancer initially DCIS treated with mastectomy followed by reconstruction and later developed chest wall recurrence in 2013 ER/PR positive HER-2 negative, patient underwent alternative therapies in [place] but could not afford the trips and hence she has not been on any breast cancer therapy for a long time.  PET/CT scan 11/12/2014 did not show metastatic disease but showed extensive involvement in the right breast in an around the implant Stage 3B  Right mastectomy 08/29/2015 at [facility] (Dr.Singh): IDC with invol of skin and skin ulceration, breast capsule inv cancer, superior medial margin positive, 1/1 LN positive, grade 3, 14.3 cm, 3.5 cm, ALI, chest wall involv, ER 90-100%, PR 80-90%, HER-2 negative, Ki 67 40-50% T4CN1 (St 3B)  Treatment plan:(Patient is premenopausal based on blood work  done May 2017, estradiol 250, FSH 5.4) 1. Patient started tamoxifen but developed severe headache with severe hypertension and tamoxifen was discontinued 03/17/2016. 2. 02/2017 started on Exemestane, Zoladex started 05/29/2017, she does have some joint aches and pains  CT CAP 07/14/2018: Chest wall recurrence and metastatic right axillary lymphadenopathy similar to previous study. Several bone lesions similar. Multiple pulmonary nodules 5 mm or less favored benign  Monthly Zoladex injections will be continued. She did not receive Zoladex injections from March until September because of lack of transportation.  She lives almost 2 hours away from here.  CT CAP will be performed today.  I will call her on Monday to discuss the results. If the scans look stable I will see her again in 6 months and she will get monthly injections.    No orders of the defined types were placed in this encounter.  The patient has a good understanding of the overall plan. she agrees with it. she will call with any problems that may develop before the next visit here.  Nicholas Lose, MD 08/21/2019  Julious Oka Dorshimer am acting as scribe for Dr. Nicholas Lose.  I have reviewed the above documentation for accuracy and completeness, and I agree with the above.

## 2019-08-26 ENCOUNTER — Telehealth: Payer: Self-pay | Admitting: Hematology and Oncology

## 2019-08-26 NOTE — Telephone Encounter (Signed)
Reviewed CT chest abd and pelvis Patient has rapid progression of disease since she hasnt been able to come for her Zolodex. Rec: 1. Pall XRT: patrient will discuss with her family because she has major transportation needs and does not have a lot of family support primarily because she is a very private individual.  She does not have a personal vehicle.  This is because of loss of employment and lack of money.  She will inform me tomorrow for decision regarding radiation and then I will make an appointment for her to see radiation oncology. 2. Resume Zolodex  Rescan in 3-4 months

## 2019-08-26 NOTE — Telephone Encounter (Signed)
I left a message regarding schedule  

## 2019-08-27 ENCOUNTER — Other Ambulatory Visit: Payer: Self-pay | Admitting: Radiation Therapy

## 2019-09-01 ENCOUNTER — Inpatient Hospital Stay: Payer: Medicare HMO

## 2019-09-11 ENCOUNTER — Telehealth: Payer: Self-pay | Admitting: Internal Medicine

## 2019-09-11 NOTE — Telephone Encounter (Signed)
This NP attempted to  contact the patient  twice leaving voice mail and my contact information,offering extra support through the [facility]   Await call back.  Wadie Lessen NP # [phone]

## 2019-09-21 ENCOUNTER — Inpatient Hospital Stay: Payer: Medicare HMO | Attending: Hematology and Oncology

## 2019-09-21 ENCOUNTER — Other Ambulatory Visit: Payer: Self-pay | Admitting: *Deleted

## 2019-09-21 ENCOUNTER — Other Ambulatory Visit: Payer: Self-pay

## 2019-09-21 VITALS — BP 147/71 | HR 88 | Temp 98.2°F | Resp 18

## 2019-09-21 DIAGNOSIS — Z170 Estrogen receptor positive status [ER+]: Secondary | ICD-10-CM | POA: Diagnosis not present

## 2019-09-21 DIAGNOSIS — C773 Secondary and unspecified malignant neoplasm of axilla and upper limb lymph nodes: Secondary | ICD-10-CM | POA: Insufficient documentation

## 2019-09-21 DIAGNOSIS — Z9011 Acquired absence of right breast and nipple: Secondary | ICD-10-CM | POA: Insufficient documentation

## 2019-09-21 DIAGNOSIS — R918 Other nonspecific abnormal finding of lung field: Secondary | ICD-10-CM | POA: Diagnosis not present

## 2019-09-21 DIAGNOSIS — Z79899 Other long term (current) drug therapy: Secondary | ICD-10-CM | POA: Diagnosis not present

## 2019-09-21 DIAGNOSIS — M899 Disorder of bone, unspecified: Secondary | ICD-10-CM | POA: Diagnosis not present

## 2019-09-21 DIAGNOSIS — C50411 Malignant neoplasm of upper-outer quadrant of right female breast: Secondary | ICD-10-CM | POA: Insufficient documentation

## 2019-09-21 MED ORDER — GOSERELIN ACETATE 3.6 MG ~~LOC~~ IMPL
3.6000 mg | DRUG_IMPLANT | Freq: Once | SUBCUTANEOUS | Status: AC
  Administered 2019-09-21: 3.6 mg via SUBCUTANEOUS
  Filled 2019-09-21: qty 3.6

## 2019-09-21 MED ORDER — CALCIUM CARBONATE-VITAMIN D 500-200 MG-UNIT PO TABS: 1.0000 | ORAL_TABLET | Freq: Two times a day (BID) | ORAL | 0 refills | Status: DC

## 2019-09-21 NOTE — Patient Instructions (Signed)

## 2019-10-21 ENCOUNTER — Inpatient Hospital Stay: Payer: Medicare HMO

## 2019-10-22 ENCOUNTER — Telehealth: Payer: Self-pay | Admitting: Hematology and Oncology

## 2019-10-22 ENCOUNTER — Inpatient Hospital Stay: Payer: Medicare HMO

## 2019-10-22 NOTE — Telephone Encounter (Signed)
Returned patient's phone call regarding rescheduling 12/17 appointment, per patient's request appointment has moved to 12/18. 

## 2019-10-23 ENCOUNTER — Other Ambulatory Visit: Payer: Self-pay

## 2019-10-23 ENCOUNTER — Inpatient Hospital Stay: Payer: Medicare HMO | Attending: Hematology and Oncology

## 2019-10-23 VITALS — BP 144/72 | HR 82 | Temp 98.2°F | Resp 18

## 2019-10-23 DIAGNOSIS — C50411 Malignant neoplasm of upper-outer quadrant of right female breast: Secondary | ICD-10-CM | POA: Insufficient documentation

## 2019-10-23 DIAGNOSIS — C773 Secondary and unspecified malignant neoplasm of axilla and upper limb lymph nodes: Secondary | ICD-10-CM | POA: Insufficient documentation

## 2019-10-23 DIAGNOSIS — Z79899 Other long term (current) drug therapy: Secondary | ICD-10-CM | POA: Insufficient documentation

## 2019-10-23 DIAGNOSIS — R918 Other nonspecific abnormal finding of lung field: Secondary | ICD-10-CM | POA: Insufficient documentation

## 2019-10-23 DIAGNOSIS — Z170 Estrogen receptor positive status [ER+]: Secondary | ICD-10-CM | POA: Diagnosis not present

## 2019-10-23 DIAGNOSIS — Z9011 Acquired absence of right breast and nipple: Secondary | ICD-10-CM | POA: Diagnosis not present

## 2019-10-23 MED ORDER — GOSERELIN ACETATE 3.6 MG ~~LOC~~ IMPL
3.6000 mg | DRUG_IMPLANT | Freq: Once | SUBCUTANEOUS | Status: AC
  Administered 2019-10-23: 16:00:00 3.6 mg via SUBCUTANEOUS

## 2019-10-23 MED ORDER — GOSERELIN ACETATE 3.6 MG ~~LOC~~ IMPL
DRUG_IMPLANT | SUBCUTANEOUS | Status: AC
  Filled 2019-10-23: qty 3.6

## 2019-10-23 MED ORDER — CALCIUM CARBONATE-VITAMIN D 500-200 MG-UNIT PO TABS: 1.0000 | ORAL_TABLET | Freq: Two times a day (BID) | ORAL | 0 refills | Status: DC

## 2019-10-23 NOTE — Patient Instructions (Signed)

## 2019-11-18 ENCOUNTER — Other Ambulatory Visit: Payer: Self-pay | Admitting: *Deleted

## 2019-11-18 DIAGNOSIS — C50411 Malignant neoplasm of upper-outer quadrant of right female breast: Secondary | ICD-10-CM

## 2019-11-18 DIAGNOSIS — Z170 Estrogen receptor positive status [ER+]: Secondary | ICD-10-CM

## 2019-11-18 MED ORDER — LETROZOLE 2.5 MG PO TABS: 2.5000 mg | ORAL_TABLET | Freq: Every day | ORAL | 1 refills | Status: DC

## 2019-11-18 MED ORDER — CALCIUM CARBONATE-VITAMIN D 500-200 MG-UNIT PO TABS: 1.0000 | ORAL_TABLET | Freq: Two times a day (BID) | ORAL | 1 refills | Status: DC

## 2019-11-18 MED FILL — LETROZOLE 2.5 MG TABLET: 2.5 | 90 days supply | Qty: 90 | Fill #0

## 2019-11-19 ENCOUNTER — Other Ambulatory Visit: Payer: Self-pay | Admitting: Hematology and Oncology

## 2019-11-19 MED ORDER — TRAMADOL HCL 50 MG PO TABS: 50.0000 mg | ORAL_TABLET | Freq: Four times a day (QID) | ORAL | 0 refills | Status: DC | PRN

## 2019-11-19 MED FILL — traMADol HCL 50 MG TABS: 50 | 15 days supply | Qty: 60 | Fill #0

## 2019-11-19 NOTE — Progress Notes (Signed)
Patient is experiencing worsening pain in the back she rates it occasionally is 10 out of 10 related to her metastatic disease.  We discussed different options and I sent a prescription for tramadol today.

## 2019-11-20 ENCOUNTER — Telehealth: Payer: Self-pay | Admitting: Hematology and Oncology

## 2019-11-20 ENCOUNTER — Ambulatory Visit: Payer: Medicare PPO

## 2019-11-20 NOTE — Telephone Encounter (Signed)
Returned patient's phone call regarding rescheduling 01/15 appointment, per patient's request appointment has been moved to 01/18.

## 2019-11-23 ENCOUNTER — Inpatient Hospital Stay: Payer: Medicare PPO | Attending: Hematology and Oncology

## 2019-11-23 ENCOUNTER — Ambulatory Visit: Payer: Medicare PPO

## 2019-11-23 ENCOUNTER — Other Ambulatory Visit: Payer: Self-pay

## 2019-11-23 VITALS — BP 157/97 | HR 103 | Temp 98.2°F | Resp 20

## 2019-11-23 DIAGNOSIS — C50411 Malignant neoplasm of upper-outer quadrant of right female breast: Secondary | ICD-10-CM | POA: Insufficient documentation

## 2019-11-23 DIAGNOSIS — C773 Secondary and unspecified malignant neoplasm of axilla and upper limb lymph nodes: Secondary | ICD-10-CM | POA: Insufficient documentation

## 2019-11-23 DIAGNOSIS — Z9011 Acquired absence of right breast and nipple: Secondary | ICD-10-CM | POA: Insufficient documentation

## 2019-11-23 DIAGNOSIS — Z170 Estrogen receptor positive status [ER+]: Secondary | ICD-10-CM | POA: Insufficient documentation

## 2019-11-23 DIAGNOSIS — R918 Other nonspecific abnormal finding of lung field: Secondary | ICD-10-CM | POA: Insufficient documentation

## 2019-11-23 DIAGNOSIS — Z79899 Other long term (current) drug therapy: Secondary | ICD-10-CM | POA: Insufficient documentation

## 2019-11-23 MED ORDER — GOSERELIN ACETATE 3.6 MG ~~LOC~~ IMPL: 3.6000 mg | DRUG_IMPLANT | Freq: Once | SUBCUTANEOUS | Status: DC

## 2019-11-24 ENCOUNTER — Inpatient Hospital Stay: Payer: Medicare PPO

## 2019-11-24 VITALS — BP 164/103 | HR 79 | Temp 99.1°F | Resp 18

## 2019-11-24 DIAGNOSIS — C50411 Malignant neoplasm of upper-outer quadrant of right female breast: Secondary | ICD-10-CM

## 2019-11-24 DIAGNOSIS — C773 Secondary and unspecified malignant neoplasm of axilla and upper limb lymph nodes: Secondary | ICD-10-CM | POA: Diagnosis not present

## 2019-11-24 DIAGNOSIS — R918 Other nonspecific abnormal finding of lung field: Secondary | ICD-10-CM | POA: Diagnosis not present

## 2019-11-24 DIAGNOSIS — Z170 Estrogen receptor positive status [ER+]: Secondary | ICD-10-CM

## 2019-11-24 DIAGNOSIS — Z9011 Acquired absence of right breast and nipple: Secondary | ICD-10-CM | POA: Diagnosis not present

## 2019-11-24 DIAGNOSIS — Z79899 Other long term (current) drug therapy: Secondary | ICD-10-CM | POA: Diagnosis not present

## 2019-11-24 MED ORDER — GOSERELIN ACETATE 3.6 MG ~~LOC~~ IMPL
3.6000 mg | DRUG_IMPLANT | Freq: Once | SUBCUTANEOUS | Status: AC
  Administered 2019-11-24: 17:00:00 3.6 mg via SUBCUTANEOUS

## 2019-11-24 MED ORDER — GOSERELIN ACETATE 3.6 MG ~~LOC~~ IMPL
DRUG_IMPLANT | SUBCUTANEOUS | Status: AC
  Filled 2019-11-24: qty 3.6

## 2019-11-24 NOTE — Progress Notes (Signed)
Okay to administer Zoladex today with BP per Sandi Mealy, PA-C.

## 2019-12-21 ENCOUNTER — Ambulatory Visit: Payer: Medicare PPO

## 2019-12-21 NOTE — Progress Notes (Deleted)
Called patient to verify appt and she stated she wasn't going to be able to make it today and would reschedule at a later date.

## 2019-12-29 ENCOUNTER — Telehealth: Payer: Self-pay | Admitting: *Deleted

## 2019-12-29 ENCOUNTER — Telehealth: Payer: Self-pay | Admitting: Hematology and Oncology

## 2019-12-29 NOTE — Telephone Encounter (Signed)
Scheduled apt per 2/23 sch - pt aware of appt date and time

## 2019-12-29 NOTE — Telephone Encounter (Signed)
Received call from pt stating she is experiencing increased pain in her back unrelieved by current pain medication.  Pt is being seen in the office on 2/25 and MD will review at that time.

## 2019-12-30 NOTE — Progress Notes (Signed)
Patient Care Team: Patient, [name] as PCP - General (General Practice)  DIAGNOSIS:    ICD-10-CM   1. Malignant neoplasm of upper-outer quadrant of right breast in female, estrogen receptor positive ([place])  C50.411    Z17.0     SUMMARY OF ONCOLOGIC HISTORY: Oncology History  Breast cancer of upper-outer quadrant of right female breast ([place])  09/21/2010 Mammogram   right breast linear and segmental pleomorphic calcifications from 4:00 to 6:00 position ultrasound revealed 1.6 cm and 1.1 cm masses   09/27/2010 Initial Biopsy   ultrasound-guided biopsy of all masses showed DCIS grade 2; patient went to cancer treatment centers of [place] for second opinion and delayed therapy   01/26/2011 Surgery   right mastectomy followed by reconstruction: 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 But she could not afford these trips.   01/08/2014 Breast MRI   right breast multiple enhancing masses within the soft tissues largest 5.6 cm in wall skin surface and the capsule of the silicone prosthesis, contiguous nodules involving in inferomedial breast and tired and subcentimeter nodules across the midline   08/29/2015 Surgery   Right mastectomy: IDC with invol of skin and skin ulceration, breast capsule inv cancer, superior medial margin positive, 1/1 LN positive, grade 3, 14.3 cm, 3.5 cm, ALI, chest wall involv, ER 90-100%, PR 80-90%, HER-2 negative, Ki 67 40-50% T4CN1 (St 3B)   03/05/2016 -  Anti-estrogen oral therapy   Tamoxifen 20 mg daily stopped due to headache and  uncontrolled hypertension. Decrease to 10 mg daily 04/03/2016, stopped June 2017 and took an estrogen metabolizer over-the-counter; started Aromasin April 2018 from a [place] physician, Zoladex started on 05/2017 and switched to Letrozole in 09/2017     CHIEF COMPLIANT: Follow-up of metastatic breast cancer  INTERVAL HISTORY: Misty Arnold is a 49 y.o. with above-mentioned history of metastatic breast cancerwhois currently on Zoladex with letrozole. She presents to the clinic today for follow-up.  Her major complaints are worsening pain in the chest wall as well as in the left lower rib cage as well as in the back area.  Previously I recommended radiation therapy for palliation but she did not do these treatments because she did not want to do anything with radiation or chemotherapy.  She fully understands that the cancer is progressing because she has been losing weight and the tumor in the chest wall appears to be causing some bony symptoms.  She is now willing to consider other treatment options. She tells me that she has tremendous stress from her family at home. She has marked difficulty with transportation as well.  ALLERGIES:  is allergic to penicillins and amoxicillin.  MEDICATIONS:  Current Outpatient Medications  Medication Sig Dispense Refill  . amLODipine (NORVASC) 10 MG tablet Take 10 mg by mouth daily.     Marland Kitchen Bioflavonoid Products (ESTER C PO) Take by mouth.    Marland Kitchen BLACK CURRANT SEED OIL PO Take by mouth.    . calcium-vitamin D (OSCAL WITH D) 500-200 MG-UNIT tablet Take 1 tablet by mouth 2 (two) times daily with a meal. 180 tablet 1  .  Cholecalciferol (VITAMIN D PO) Take 5,000 Units by mouth 2 (two) times daily.     . Fish Oil OIL by Does not apply route.    . Flaxseed Oil OIL by Does not apply route.    Marland Kitchen goserelin (ZOLADEX) 3.6 MG injection Inject 3.6 mg into the skin every 28 (twenty-eight) days.    Marland Kitchen letrozole (FEMARA) 2.5 MG tablet Take 1 tablet (2.5 mg total) by mouth  daily. 90 tablet 1  . lidocaine-prilocaine (EMLA) cream Apply 1 application topically as needed. Apply 1 hr prior to zoladex injection. 30 g 0  . Misc Natural Products (DANDELION ROOT PO) Take by mouth.    . palbociclib (IBRANCE) 100 MG capsule Take 1 capsule (100 mg total) by mouth daily with breakfast. Take whole with food. Take for 21 days on, 7 days off, repeat every 28 days. 21 capsule 3  . traMADol (ULTRAM) 50 MG tablet Take 1 tablet (50 mg total) by mouth every 6 (six) hours as needed. 60 tablet 0  . [place], Absinthium, Oil OIL by Does not apply route.     No current facility-administered medications for this visit.   Facility-Administered Medications Ordered in Other Visits  Medication Dose Route Frequency Provider Last Rate Last Admin  . goserelin (ZOLADEX) injection 3.6 mg  3.6 mg Subcutaneous Once Nicholas Lose, MD        PHYSICAL EXAMINATION: ECOG PERFORMANCE STATUS: 2 - Symptomatic, <50% confined to bed  Vitals:   12/31/19 1547  BP: (!) 161/94  Pulse: (!) 112  Resp: 20  Temp: 99.1 F (37.3 C)  SpO2: 100%   Filed Weights   12/31/19 1547  Weight: 113 lb 4.8 oz (51.4 kg)    LABORATORY DATA:  I have reviewed the data as listed CMP Latest Ref Rng & Units 01/15/2019 08/13/2018 06/16/2018  Glucose 70 - 99 mg/dL 103(H) 89 98  BUN 6 - 20 mg/dL _0 Creatinine 0.44 - 1.00 mg/dL 0.87 0.80 0.87  Sodium 135 - 145 mmol/L 144 144 142  Potassium 3.5 - 5.1 mmol/L 3.4(L) 3.6 3.2(L)  Chloride 98 - 111 mmol/L 107 106 103  CO2 22 - 32 mmol/L _1 Calcium 8.9 - 10.3 mg/dL 9.7 10.0 9.7  Total Protein 6.5 - 8.1 g/dL 7.6 7.6 7.6  Total Bilirubin 0.3 - 1.2 mg/dL 0.5 0.5 0.6  Alkaline Phos 38 - 126 U/L 135(H) 129(H) 134(H)  AST 15 - 41 U/L _2 ALT 0 - 44 U/L _3 Lab Results  Component Value Date   WBC 5.6 01/15/2019   HGB 13.8 01/15/2019   HCT 40.9 01/15/2019   MCV 88.7 01/15/2019   PLT 253 01/15/2019   NEUTROABS 2.9 01/15/2019    ASSESSMENT & PLAN:   Breast cancer of upper-outer quadrant of right female breast ([place]) Recurrent right breast cancer initially DCIS treated with mastectomy followed by reconstruction and later developed chest wall recurrence in 2013 ER/PR positive HER-2 negative, patient underwent alternative therapies in [place] but could not afford the trips and hence she has not been on any breast cancer therapy for a long time.  PET/CT scan 11/12/2014 did not show metastatic disease but showed extensive involvement in the right breast in an around the implant Stage 3B  Right mastectomy 08/29/2015 at [facility] (Dr.Singh): IDC with invol of skin and skin ulceration, breast capsule inv cancer, superior medial margin positive, 1/1 LN positive, grade 3, 14.3 cm, 3.5 cm, ALI, chest wall involv,  ER 90-100%, PR 80-90%, HER-2 negative, Ki 67 40-50% T4CN1 (St 3B)  Treatment plan:(Patient is premenopausal based on blood work done May 2017, estradiol 250, FSH 5.4) 1. Patient started tamoxifen but developed severe headache with severe hypertension and tamoxifen was discontinued 03/17/2016. 2. 02/2017 started on Exemestane, Zoladex started 05/29/2017, she does have some joint aches and pains   CT CAP: 08/21/2019: Interval development of new 1 progressive bulky metastatic disease in the bony anatomy and chest wall of the thorax.  Interval progression of left breast lesion.  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  Stable lung nodules.  Stable low-density lesion in the liver.  (Probably benign)  We recommended palliative radiation therapy.  However patient decided not to do it.  Treatment plan: I recommended adding Ibrance to her current treatment plan and after much discussion she is willing to try it.  Because of her difficulty with getting frequent lab checks, we will start her at 100 mg of Ibrance.  Return to clinic in 4 weeks with labs  injection and follow-up. No orders of the defined types were placed in this encounter.  The patient has a good understanding of the overall plan. she agrees with it. she will call with any problems that may develop before the next visit here.  Total time spent: 30 mins including face to face time and time spent for planning, charting and coordination of care  Nicholas Lose, MD 12/31/2019  I, Cloyde Reams Dorshimer, am acting as scribe for Dr. Nicholas Lose.  I have reviewed the above documentation for accuracy and completeness, and I agree with the above.

## 2019-12-31 ENCOUNTER — Other Ambulatory Visit: Payer: Self-pay

## 2019-12-31 ENCOUNTER — Inpatient Hospital Stay: Payer: Medicare PPO | Attending: Hematology and Oncology | Admitting: Hematology and Oncology

## 2019-12-31 ENCOUNTER — Telehealth: Payer: Self-pay | Admitting: Pharmacist

## 2019-12-31 ENCOUNTER — Inpatient Hospital Stay: Payer: Medicare PPO

## 2019-12-31 DIAGNOSIS — I10 Essential (primary) hypertension: Secondary | ICD-10-CM | POA: Insufficient documentation

## 2019-12-31 DIAGNOSIS — R918 Other nonspecific abnormal finding of lung field: Secondary | ICD-10-CM | POA: Insufficient documentation

## 2019-12-31 DIAGNOSIS — Z170 Estrogen receptor positive status [ER+]: Secondary | ICD-10-CM

## 2019-12-31 DIAGNOSIS — Z79899 Other long term (current) drug therapy: Secondary | ICD-10-CM | POA: Insufficient documentation

## 2019-12-31 DIAGNOSIS — Z79811 Long term (current) use of aromatase inhibitors: Secondary | ICD-10-CM | POA: Diagnosis not present

## 2019-12-31 DIAGNOSIS — C50411 Malignant neoplasm of upper-outer quadrant of right female breast: Secondary | ICD-10-CM

## 2019-12-31 DIAGNOSIS — C7951 Secondary malignant neoplasm of bone: Secondary | ICD-10-CM | POA: Diagnosis not present

## 2019-12-31 DIAGNOSIS — R0789 Other chest pain: Secondary | ICD-10-CM | POA: Insufficient documentation

## 2019-12-31 DIAGNOSIS — Z9011 Acquired absence of right breast and nipple: Secondary | ICD-10-CM | POA: Diagnosis not present

## 2019-12-31 MED ORDER — GOSERELIN ACETATE 3.6 MG ~~LOC~~ IMPL
3.6000 mg | DRUG_IMPLANT | Freq: Once | SUBCUTANEOUS | Status: AC
  Administered 2019-12-31: 3.6 mg via SUBCUTANEOUS

## 2019-12-31 MED ORDER — PALBOCICLIB 100 MG PO CAPS: 100.0000 mg | ORAL_CAPSULE | Freq: Every day | ORAL | 3 refills | Status: DC

## 2019-12-31 NOTE — Assessment & Plan Note (Signed)
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  PET/CT scan 11/12/2014 did not show metastatic disease but showed extensive involvement in the right breast in an around the implant Stage 3B  Right mastectomy 08/29/2015 at [facility] (Dr.Singh): IDC with invol of skin and skin ulceration, breast capsule inv cancer, superior medial margin positive, 1/1 LN positive, grade 3, 14.3 cm, 3.5 cm, ALI, chest wall involv, ER 90-100%, PR 80-90%, HER-2 negative, Ki 67 40-50% T4CN1 (St 3B)  Treatment plan:(Patient is premenopausal based on blood work done May 2017, estradiol 250, FSH 5.4) 1. Patient started tamoxifen but developed severe headache with severe hypertension and tamoxifen was discontinued 03/17/2016. 2. 02/2017 started on Exemestane, Zoladex started 05/29/2017, she does have some joint aches and pains   CT CAP: 08/21/2019: Interval development of new 1 progressive bulky metastatic disease in the bony anatomy and chest wall of the thorax.  Interval progression of left breast lesion.  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  Stable lung nodules.  Stable low-density lesion in the liver.  (Probably benign)  We recommended palliative radiation therapy.  However patient decided not to do it.

## 2019-12-31 NOTE — Telephone Encounter (Signed)
Oral Oncology Pharmacist Encounter  Received new prescription for Ibrance (palbociclib) for the treatment of breast cancer, ER/PR positive, HER2 negative in conjunction with letrozole, planned duration until disease progression or unacceptable drug toxicity.  Prescription dose and frequency assessed. Per MD, starting her at 164m due to patient difficulty with getting frequent lab checks.  No recent CMP to evaluate, patient scheduled for labs on 01/18/20  Current medication list in Epic reviewed, one DDIs with palbociclib identified: -Amlodipine: palbociclib may increase the concentration of amlodipine. Monitor patient for decrease in BP. No baseline dose adjustment required.  [facility].  Oral Oncology Clinic will continue to follow for insurance authorization, copayment issues, initial counseling and start date.  ADarl Pikes PharmD, BCPS, BLouisville Surgery CenterHematology/Oncology Clinical Pharmacist [facility]/AP [phone] 12/31/2019 5:52 PM

## 2019-12-31 NOTE — Patient Instructions (Signed)

## 2020-01-01 ENCOUNTER — Telehealth: Payer: Self-pay

## 2020-01-01 ENCOUNTER — Telehealth: Payer: Self-pay | Admitting: Hematology and Oncology

## 2020-01-01 MED ORDER — PALBOCICLIB 100 MG PO TABS: 100.0000 mg | ORAL_TABLET | Freq: Every day | ORAL | 3 refills | Status: DC

## 2020-01-01 NOTE — Telephone Encounter (Signed)
I talk with patient regarding schedule  

## 2020-01-01 NOTE — Telephone Encounter (Signed)
Oral Oncology Patient Advocate Encounter  Prior Authorization for [facility] has been approved.    PA# [id number] Effective dates: 12/31/19 through 06/28/20  Patients co-pay is $[id number]  Oral Oncology Clinic will continue to follow.   [place] Patient [place] Phone [phone] Fax [phone] 01/01/2020 8:17 AM

## 2020-01-01 NOTE — Telephone Encounter (Signed)
Oral Oncology Patient Advocate Encounter  Received notification from [facility] that prior authorization for [facility] is required.  PA submitted on CoverMyMeds Key [id number] Status is pending  Oral Oncology Clinic will continue to follow.  [place] Patient [place] Phone [phone] Fax [phone] 01/01/2020 8:16 AM

## 2020-01-04 ENCOUNTER — Emergency Department (HOSPITAL_COMMUNITY): Payer: Medicare PPO

## 2020-01-04 ENCOUNTER — Encounter (HOSPITAL_COMMUNITY): Payer: Self-pay

## 2020-01-04 ENCOUNTER — Emergency Department (HOSPITAL_COMMUNITY): Payer: Medicare PPO | Admitting: Anesthesiology

## 2020-01-04 ENCOUNTER — Inpatient Hospital Stay (HOSPITAL_COMMUNITY)
Admission: EM | Disposition: A | Discharge status: Rehabilitation Facility | Payer: Self-pay | Source: Home / Self Care | Attending: Neurosurgery

## 2020-01-04 ENCOUNTER — Inpatient Hospital Stay (HOSPITAL_COMMUNITY)
Admission: EM | Admit: 2020-01-04 | Discharge: 2020-01-08 | DRG: 457 | Disposition: A | Discharge status: Rehabilitation Facility | Payer: Medicare PPO | Attending: Neurosurgery | Admitting: Neurosurgery

## 2020-01-04 DIAGNOSIS — Z9011 Acquired absence of right breast and nipple: Secondary | ICD-10-CM

## 2020-01-04 DIAGNOSIS — Z79899 Other long term (current) drug therapy: Secondary | ICD-10-CM | POA: Diagnosis not present

## 2020-01-04 DIAGNOSIS — Y92230 Patient room in hospital as the place of occurrence of the external cause: Secondary | ICD-10-CM | POA: Diagnosis not present

## 2020-01-04 DIAGNOSIS — I10 Essential (primary) hypertension: Secondary | ICD-10-CM | POA: Diagnosis present

## 2020-01-04 DIAGNOSIS — G96198 Other disorders of meninges, not elsewhere classified: Secondary | ICD-10-CM

## 2020-01-04 DIAGNOSIS — C7949 Secondary malignant neoplasm of other parts of nervous system: Secondary | ICD-10-CM | POA: Diagnosis present

## 2020-01-04 DIAGNOSIS — Z9889 Other specified postprocedural states: Secondary | ICD-10-CM

## 2020-01-04 DIAGNOSIS — N319 Neuromuscular dysfunction of bladder, unspecified: Secondary | ICD-10-CM

## 2020-01-04 DIAGNOSIS — D72829 Elevated white blood cell count, unspecified: Secondary | ICD-10-CM

## 2020-01-04 DIAGNOSIS — Z4789 Encounter for other orthopedic aftercare: Secondary | ICD-10-CM | POA: Diagnosis not present

## 2020-01-04 DIAGNOSIS — C50919 Malignant neoplasm of unspecified site of unspecified female breast: Secondary | ICD-10-CM | POA: Diagnosis not present

## 2020-01-04 DIAGNOSIS — Z20822 Contact with and (suspected) exposure to covid-19: Secondary | ICD-10-CM | POA: Diagnosis present

## 2020-01-04 DIAGNOSIS — K592 Neurogenic bowel, not elsewhere classified: Secondary | ICD-10-CM | POA: Diagnosis present

## 2020-01-04 DIAGNOSIS — D62 Acute posthemorrhagic anemia: Secondary | ICD-10-CM | POA: Diagnosis not present

## 2020-01-04 DIAGNOSIS — K5900 Constipation, unspecified: Secondary | ICD-10-CM | POA: Diagnosis present

## 2020-01-04 DIAGNOSIS — Z880 Allergy status to penicillin: Secondary | ICD-10-CM | POA: Diagnosis not present

## 2020-01-04 DIAGNOSIS — D72828 Other elevated white blood cell count: Secondary | ICD-10-CM | POA: Diagnosis not present

## 2020-01-04 DIAGNOSIS — T380X5A Adverse effect of glucocorticoids and synthetic analogues, initial encounter: Secondary | ICD-10-CM | POA: Diagnosis not present

## 2020-01-04 DIAGNOSIS — G8222 Paraplegia, incomplete: Secondary | ICD-10-CM | POA: Diagnosis not present

## 2020-01-04 DIAGNOSIS — R29898 Other symptoms and signs involving the musculoskeletal system: Secondary | ICD-10-CM | POA: Diagnosis not present

## 2020-01-04 DIAGNOSIS — Z888 Allergy status to other drugs, medicaments and biological substances status: Secondary | ICD-10-CM | POA: Diagnosis not present

## 2020-01-04 DIAGNOSIS — Z8249 Family history of ischemic heart disease and other diseases of the circulatory system: Secondary | ICD-10-CM | POA: Diagnosis not present

## 2020-01-04 DIAGNOSIS — Z853 Personal history of malignant neoplasm of breast: Secondary | ICD-10-CM | POA: Diagnosis not present

## 2020-01-04 DIAGNOSIS — M7989 Other specified soft tissue disorders: Secondary | ICD-10-CM | POA: Diagnosis not present

## 2020-01-04 DIAGNOSIS — G959 Disease of spinal cord, unspecified: Secondary | ICD-10-CM | POA: Diagnosis present

## 2020-01-04 DIAGNOSIS — C7951 Secondary malignant neoplasm of bone: Principal | ICD-10-CM | POA: Diagnosis present

## 2020-01-04 DIAGNOSIS — Z79811 Long term (current) use of aromatase inhibitors: Secondary | ICD-10-CM

## 2020-01-04 DIAGNOSIS — Z419 Encounter for procedure for purposes other than remedying health state, unspecified: Secondary | ICD-10-CM

## 2020-01-04 HISTORY — PX: LAMINECTOMY: SHX219

## 2020-01-04 LAB — CBC
HCT: 31 % — ABNORMAL LOW (ref 36.0–46.0)
Hemoglobin: 10.8 g/dL — ABNORMAL LOW (ref 12.0–15.0)
MCH: 30 pg (ref 26.0–34.0)
MCHC: 34.8 g/dL (ref 30.0–36.0)
MCV: 86.1 fL (ref 80.0–100.0)
Platelets: 120 10*3/uL — ABNORMAL LOW (ref 150–400)
RBC: 3.6 MIL/uL — ABNORMAL LOW (ref 3.87–5.11)
RDW: 13.2 % (ref 11.5–15.5)
WBC: 12.7 10*3/uL — ABNORMAL HIGH (ref 4.0–10.5)
nRBC: 0 % (ref 0.0–0.2)

## 2020-01-04 LAB — BASIC METABOLIC PANEL
Anion gap: 7 (ref 5–15)
BUN: 16 mg/dL (ref 6–20)
CO2: 26 mmol/L (ref 22–32)
Calcium: 8.5 mg/dL — ABNORMAL LOW (ref 8.9–10.3)
Chloride: 106 mmol/L (ref 98–111)
Creatinine, Ser: 0.75 mg/dL (ref 0.44–1.00)
GFR calc Af Amer: >60 mL/min (ref >60)
GFR calc non Af Amer: >60 mL/min (ref >60)
Glucose, Bld: 182 mg/dL — ABNORMAL HIGH (ref 70–99)
Potassium: 3.9 mmol/L (ref 3.5–5.1)
Sodium: 139 mmol/L (ref 135–145)

## 2020-01-04 LAB — RESPIRATORY PANEL BY RT PCR (FLU A&B, COVID)
Influenza A by PCR: NEGATIVE
Influenza B by PCR: NEGATIVE
SARS Coronavirus 2 by RT PCR: NEGATIVE

## 2020-01-04 LAB — POCT I-STAT, CHEM 8
BUN: 16 mg/dL (ref 6–20)
Calcium, Ion: 1.14 mmol/L — ABNORMAL LOW (ref 1.15–1.40)
Chloride: 103 mmol/L (ref 98–111)
Creatinine, Ser: 0.6 mg/dL (ref 0.44–1.00)
Glucose, Bld: 171 mg/dL — ABNORMAL HIGH (ref 70–99)
HCT: 29 % — ABNORMAL LOW (ref 36.0–46.0)
Hemoglobin: 9.9 g/dL — ABNORMAL LOW (ref 12.0–15.0)
Potassium: 4.6 mmol/L (ref 3.5–5.1)
Sodium: 140 mmol/L (ref 135–145)
TCO2: 27 mmol/L (ref 22–32)

## 2020-01-04 LAB — ABO/RH: ABO/RH(D): B POS

## 2020-01-04 LAB — MRSA PCR SCREENING: MRSA by PCR: NEGATIVE

## 2020-01-04 SURGERY — THORACIC LAMINECTOMY FOR TUMOR
Anesthesia: General | Site: Spine Thoracic

## 2020-01-04 MED ORDER — ARTIFICIAL TEARS OPHTHALMIC OINT
TOPICAL_OINTMENT | OPHTHALMIC | Status: DC | PRN
  Administered 2020-01-04: 1 via OPHTHALMIC

## 2020-01-04 MED ORDER — BUPIVACAINE HCL (PF) 0.5 % IJ SOLN
INTRAMUSCULAR | Status: AC
  Filled 2020-01-04: qty 30

## 2020-01-04 MED ORDER — POLYETHYLENE GLYCOL 3350 17 G PO PACK
17.0000 g | PACK | Freq: Every day | ORAL | Status: DC | PRN
  Administered 2020-01-05 – 2020-01-07 (×3): 17 g via ORAL
  Filled 2020-01-04 (×4): qty 1

## 2020-01-04 MED ORDER — LACTATED RINGERS IV SOLN: INTRAVENOUS | Status: DC | PRN

## 2020-01-04 MED ORDER — GADOBUTROL 1 MMOL/ML IV SOLN
4.9000 mL | Freq: Once | INTRAVENOUS | Status: AC | PRN
  Administered 2020-01-04: 4.9 mL via INTRAVENOUS

## 2020-01-04 MED ORDER — SODIUM CHLORIDE 0.9% IV SOLUTION: Freq: Once | INTRAVENOUS | Status: DC

## 2020-01-04 MED ORDER — ACETAMINOPHEN 650 MG RE SUPP: 650.0000 mg | RECTAL | Status: DC | PRN

## 2020-01-04 MED ORDER — ROCURONIUM BROMIDE 50 MG/5ML IV SOSY
PREFILLED_SYRINGE | INTRAVENOUS | Status: DC | PRN
  Administered 2020-01-04: 40 mg via INTRAVENOUS
  Administered 2020-01-04: 20 mg via INTRAVENOUS
  Administered 2020-01-04: 40 mg via INTRAVENOUS
  Administered 2020-01-04 (×2): 50 mg via INTRAVENOUS

## 2020-01-04 MED ORDER — CEFAZOLIN SODIUM-DEXTROSE 2-4 GM/100ML-% IV SOLN
2.0000 g | INTRAVENOUS | Status: AC
  Administered 2020-01-04: 2 g via INTRAVENOUS
  Administered 2020-01-04: 1 g via INTRAVENOUS

## 2020-01-04 MED ORDER — AMLODIPINE BESYLATE 10 MG PO TABS
10.0000 mg | ORAL_TABLET | Freq: Every day | ORAL | Status: DC
  Administered 2020-01-05 – 2020-01-08 (×4): 10 mg via ORAL
  Filled 2020-01-04 (×4): qty 1

## 2020-01-04 MED ORDER — CEFAZOLIN SODIUM-DEXTROSE 1-4 GM/50ML-% IV SOLN
1.0000 g | Freq: Three times a day (TID) | INTRAVENOUS | Status: AC
  Administered 2020-01-04 – 2020-01-05 (×2): 1 g via INTRAVENOUS
  Filled 2020-01-04 (×2): qty 50

## 2020-01-04 MED ORDER — VITAMIN D 25 MCG (1000 UNIT) PO TABS
5000.0000 [IU] | ORAL_TABLET | Freq: Two times a day (BID) | ORAL | Status: DC
  Administered 2020-01-04 – 2020-01-08 (×8): 5000 [IU] via ORAL
  Filled 2020-01-04 (×9): qty 5

## 2020-01-04 MED ORDER — DEXAMETHASONE SODIUM PHOSPHATE 10 MG/ML IJ SOLN
10.0000 mg | Freq: Once | INTRAMUSCULAR | Status: AC
  Administered 2020-01-04: 10 mg via INTRAVENOUS

## 2020-01-04 MED ORDER — MIDAZOLAM HCL 2 MG/2ML IJ SOLN
INTRAMUSCULAR | Status: AC
  Filled 2020-01-04: qty 2

## 2020-01-04 MED ORDER — ALBUMIN HUMAN 5 % IV SOLN: INTRAVENOUS | Status: DC | PRN

## 2020-01-04 MED ORDER — ONDANSETRON HCL 4 MG/2ML IJ SOLN
INTRAMUSCULAR | Status: DC | PRN
  Administered 2020-01-04: 4 mg via INTRAVENOUS

## 2020-01-04 MED ORDER — THROMBIN 20000 UNITS EX SOLR
CUTANEOUS | Status: DC | PRN
  Administered 2020-01-04: 14:00:00 20 mL

## 2020-01-04 MED ORDER — THROMBIN 5000 UNITS EX SOLR
CUTANEOUS | Status: AC
  Filled 2020-01-04: qty 10000

## 2020-01-04 MED ORDER — CHLORHEXIDINE GLUCONATE CLOTH 2 % EX PADS
6.0000 | MEDICATED_PAD | Freq: Every day | CUTANEOUS | Status: DC
  Administered 2020-01-04 – 2020-01-08 (×5): 6 via TOPICAL

## 2020-01-04 MED ORDER — PROPOFOL 10 MG/ML IV BOLUS
INTRAVENOUS | Status: DC | PRN
  Administered 2020-01-04: 130 mg via INTRAVENOUS

## 2020-01-04 MED ORDER — DIAZEPAM 5 MG PO TABS: 5.0000 mg | ORAL_TABLET | Freq: Four times a day (QID) | ORAL | Status: DC | PRN

## 2020-01-04 MED ORDER — POTASSIUM CHLORIDE IN NACL 20-0.9 MEQ/L-% IV SOLN
INTRAVENOUS | Status: DC
  Filled 2020-01-04 (×2): qty 1000

## 2020-01-04 MED ORDER — PHENYLEPHRINE 40 MCG/ML (10ML) SYRINGE FOR IV PUSH (FOR BLOOD PRESSURE SUPPORT)
PREFILLED_SYRINGE | INTRAVENOUS | Status: DC | PRN
  Administered 2020-01-04: 80 ug via INTRAVENOUS
  Administered 2020-01-04: 120 ug via INTRAVENOUS

## 2020-01-04 MED ORDER — MENTHOL 3 MG MT LOZG
1.0000 | LOZENGE | OROMUCOSAL | Status: DC | PRN
  Filled 2020-01-04: qty 9

## 2020-01-04 MED ORDER — ONDANSETRON HCL 4 MG/2ML IJ SOLN: 4.0000 mg | Freq: Four times a day (QID) | INTRAMUSCULAR | Status: DC | PRN

## 2020-01-04 MED ORDER — DEXAMETHASONE SODIUM PHOSPHATE 4 MG/ML IJ SOLN
4.0000 mg | Freq: Four times a day (QID) | INTRAMUSCULAR | Status: DC
  Administered 2020-01-04 – 2020-01-08 (×6): 4 mg via INTRAVENOUS
  Filled 2020-01-04 (×6): qty 1

## 2020-01-04 MED ORDER — CALCIUM CARBONATE-VITAMIN D 500-200 MG-UNIT PO TABS
1.0000 | ORAL_TABLET | Freq: Two times a day (BID) | ORAL | Status: DC
  Administered 2020-01-04 – 2020-01-08 (×8): 1 via ORAL
  Filled 2020-01-04 (×9): qty 1

## 2020-01-04 MED ORDER — FLEET ENEMA 7-19 GM/118ML RE ENEM: 1.0000 | ENEMA | Freq: Once | RECTAL | Status: DC | PRN

## 2020-01-04 MED ORDER — THROMBIN 20000 UNITS EX SOLR
CUTANEOUS | Status: AC
  Filled 2020-01-04: qty 20000

## 2020-01-04 MED ORDER — FENTANYL CITRATE (PF) 250 MCG/5ML IJ SOLN
INTRAMUSCULAR | Status: DC | PRN
  Administered 2020-01-04: 25 ug via INTRAVENOUS
  Administered 2020-01-04: 50 ug via INTRAVENOUS
  Administered 2020-01-04: 25 ug via INTRAVENOUS
  Administered 2020-01-04: 100 ug via INTRAVENOUS
  Administered 2020-01-04: 50 ug via INTRAVENOUS

## 2020-01-04 MED ORDER — PHENOL 1.4 % MT LIQD: 1.0000 | OROMUCOSAL | Status: DC | PRN

## 2020-01-04 MED ORDER — SODIUM CHLORIDE 0.9% FLUSH
3.0000 mL | Freq: Two times a day (BID) | INTRAVENOUS | Status: DC
  Administered 2020-01-04 – 2020-01-08 (×8): 3 mL via INTRAVENOUS

## 2020-01-04 MED ORDER — SODIUM CHLORIDE 0.9 % IV SOLN: 250.0000 mL | INTRAVENOUS | Status: DC

## 2020-01-04 MED ORDER — THROMBIN 5000 UNITS EX SOLR
OROMUCOSAL | Status: DC | PRN
  Administered 2020-01-04 (×6): 5 mL

## 2020-01-04 MED ORDER — BUPIVACAINE HCL (PF) 0.25 % IJ SOLN
INTRAMUSCULAR | Status: DC | PRN
  Administered 2020-01-04: 10 mL

## 2020-01-04 MED ORDER — LIDOCAINE 2% (20 MG/ML) 5 ML SYRINGE
INTRAMUSCULAR | Status: DC | PRN
  Administered 2020-01-04: 60 mg via INTRAVENOUS

## 2020-01-04 MED ORDER — HYDROMORPHONE HCL 1 MG/ML IJ SOLN
INTRAMUSCULAR | Status: AC
  Administered 2020-01-05: 1 mg via INTRAVENOUS
  Filled 2020-01-04: qty 1

## 2020-01-04 MED ORDER — SUGAMMADEX SODIUM 200 MG/2ML IV SOLN
INTRAVENOUS | Status: DC | PRN
  Administered 2020-01-04: 200 mg via INTRAVENOUS

## 2020-01-04 MED ORDER — HYDROMORPHONE HCL 1 MG/ML IJ SOLN
0.5000 mg | Freq: Once | INTRAMUSCULAR | Status: AC
  Administered 2020-01-04: 0.5 mg via INTRAVENOUS

## 2020-01-04 MED ORDER — LABETALOL HCL 5 MG/ML IV SOLN
INTRAVENOUS | Status: DC | PRN
  Administered 2020-01-04 (×2): 5 mg via INTRAVENOUS

## 2020-01-04 MED ORDER — BISACODYL 10 MG RE SUPP
10.0000 mg | Freq: Every day | RECTAL | Status: DC | PRN
  Administered 2020-01-06 – 2020-01-07 (×2): 10 mg via RECTAL
  Filled 2020-01-04 (×2): qty 1

## 2020-01-04 MED ORDER — PHENYLEPHRINE HCL-NACL 10-0.9 MG/250ML-% IV SOLN
INTRAVENOUS | Status: DC | PRN
  Administered 2020-01-04: 30 ug/min via INTRAVENOUS

## 2020-01-04 MED ORDER — HYDROMORPHONE HCL 1 MG/ML IJ SOLN
1.0000 mg | INTRAMUSCULAR | Status: DC | PRN
  Administered 2020-01-04 – 2020-01-08 (×13): 1 mg via INTRAVENOUS
  Filled 2020-01-04 (×14): qty 1

## 2020-01-04 MED ORDER — ACETAMINOPHEN 325 MG PO TABS: 650.0000 mg | ORAL_TABLET | ORAL | Status: DC | PRN

## 2020-01-04 MED ORDER — OXYCODONE HCL 5 MG PO TABS
10.0000 mg | ORAL_TABLET | ORAL | Status: DC | PRN
  Administered 2020-01-04 – 2020-01-07 (×6): 10 mg via ORAL
  Filled 2020-01-04 (×6): qty 2

## 2020-01-04 MED ORDER — HYDROMORPHONE HCL 1 MG/ML IJ SOLN
0.5000 mg | Freq: Once | INTRAMUSCULAR | Status: AC
  Administered 2020-01-04: 0.5 mg via INTRAVENOUS
  Filled 2020-01-04: qty 1

## 2020-01-04 MED ORDER — FENTANYL CITRATE (PF) 250 MCG/5ML IJ SOLN
INTRAMUSCULAR | Status: AC
  Filled 2020-01-04: qty 5

## 2020-01-04 MED ORDER — SODIUM CHLORIDE 0.9% FLUSH: 3.0000 mL | INTRAVENOUS | Status: DC | PRN

## 2020-01-04 MED ORDER — THROMBIN 5000 UNITS EX SOLR
CUTANEOUS | Status: AC
  Filled 2020-01-04: qty 5000

## 2020-01-04 MED ORDER — 0.9 % SODIUM CHLORIDE (POUR BTL) OPTIME
TOPICAL | Status: DC | PRN
  Administered 2020-01-04: 14:00:00 1000 mL

## 2020-01-04 MED ORDER — MIDAZOLAM HCL 5 MG/5ML IJ SOLN
INTRAMUSCULAR | Status: DC | PRN
  Administered 2020-01-04: 2 mg via INTRAVENOUS

## 2020-01-04 MED ORDER — DEXAMETHASONE 4 MG PO TABS
4.0000 mg | ORAL_TABLET | Freq: Four times a day (QID) | ORAL | Status: DC
  Administered 2020-01-04 – 2020-01-08 (×10): 4 mg via ORAL
  Filled 2020-01-04 (×15): qty 1

## 2020-01-04 MED ORDER — ESMOLOL HCL 100 MG/10ML IV SOLN
INTRAVENOUS | Status: DC | PRN
  Administered 2020-01-04: 30 mg via INTRAVENOUS
  Administered 2020-01-04: 20 mg via INTRAVENOUS
  Administered 2020-01-04: 50 mg via INTRAVENOUS

## 2020-01-04 MED ORDER — HYDROCODONE-ACETAMINOPHEN 10-325 MG PO TABS
1.0000 | ORAL_TABLET | ORAL | Status: DC | PRN
  Administered 2020-01-04 – 2020-01-07 (×4): 1 via ORAL
  Filled 2020-01-04 (×6): qty 1

## 2020-01-04 MED ORDER — SODIUM CHLORIDE 0.9 % IV SOLN
INTRAVENOUS | Status: DC | PRN
  Administered 2020-01-04: 14:00:00 500 mL

## 2020-01-04 MED ORDER — THROMBIN 5000 UNITS EX SOLR
CUTANEOUS | Status: AC
  Filled 2020-01-04: qty 15000

## 2020-01-04 MED ORDER — PROPOFOL 10 MG/ML IV BOLUS
INTRAVENOUS | Status: AC
  Filled 2020-01-04: qty 40

## 2020-01-04 MED ORDER — SODIUM CHLORIDE 0.9 % IV SOLN: INTRAVENOUS | Status: DC | PRN

## 2020-01-04 MED ORDER — ONDANSETRON HCL 4 MG PO TABS: 4.0000 mg | ORAL_TABLET | Freq: Four times a day (QID) | ORAL | Status: DC | PRN

## 2020-01-04 SURGICAL SUPPLY — 71 items
BAG DECANTER FOR FLEXI CONT (MISCELLANEOUS) ×2 IMPLANT
BAND RUBBER #18 3X1/16 STRL (MISCELLANEOUS) ×4 IMPLANT
BENZOIN TINCTURE PRP APPL 2/3 (GAUZE/BANDAGES/DRESSINGS) ×2 IMPLANT
BLADE CLIPPER SURG (BLADE) IMPLANT
BLADE SURG 11 STRL SS (BLADE) IMPLANT
BUR CUTTER 7.0 ROUND (BURR) ×2 IMPLANT
BUR MATCHSTICK NEURO 3.0 LAGG (BURR) ×2 IMPLANT
CANISTER SUCT 3000ML PPV (MISCELLANEOUS) ×4 IMPLANT
CAP LCK SPNE (Orthopedic Implant) ×8 IMPLANT
CAP LOCK SPINE RADIUS (Orthopedic Implant) ×8 IMPLANT
CAP LOCKING (Orthopedic Implant) ×8 IMPLANT
CARTRIDGE OIL MAESTRO DRILL (MISCELLANEOUS) ×1 IMPLANT
COVER WAND RF STERILE (DRAPES) ×2 IMPLANT
DERMABOND ADVANCED (GAUZE/BANDAGES/DRESSINGS) ×1
DERMABOND ADVANCED .7 DNX12 (GAUZE/BANDAGES/DRESSINGS) ×1 IMPLANT
DIFFUSER DRILL AIR PNEUMATIC (MISCELLANEOUS) ×2 IMPLANT
DRAPE LAPAROTOMY 100X72X124 (DRAPES) ×2 IMPLANT
DRAPE LAPAROTOMY T 102X78X121 (DRAPES) IMPLANT
DRAPE MICROSCOPE LEICA (MISCELLANEOUS) ×2 IMPLANT
DRAPE SURG 17X23 STRL (DRAPES) ×4 IMPLANT
DRSG OPSITE POSTOP 4X10 (GAUZE/BANDAGES/DRESSINGS) ×2 IMPLANT
ELECT REM PT RETURN 9FT ADLT (ELECTROSURGICAL) ×2
ELECTRODE REM PT RTRN 9FT ADLT (ELECTROSURGICAL) ×1 IMPLANT
EVACUATOR 1/8 PVC DRAIN (DRAIN) ×2 IMPLANT
GAUZE 4X4 16PLY RFD (DISPOSABLE) ×2 IMPLANT
GAUZE SPONGE 4X4 12PLY STRL (GAUZE/BANDAGES/DRESSINGS) ×2 IMPLANT
GLOVE BIO SURGEON STRL SZ 6.5 (GLOVE) ×2 IMPLANT
GLOVE BIOGEL PI IND STRL 6.5 (GLOVE) ×1 IMPLANT
GLOVE BIOGEL PI INDICATOR 6.5 (GLOVE) ×1
GLOVE ECLIPSE 9.0 STRL (GLOVE) ×2 IMPLANT
GOWN STRL REUS W/ TWL LRG LVL3 (GOWN DISPOSABLE) IMPLANT
GOWN STRL REUS W/ TWL XL LVL3 (GOWN DISPOSABLE) IMPLANT
GOWN STRL REUS W/TWL 2XL LVL3 (GOWN DISPOSABLE) IMPLANT
GOWN STRL REUS W/TWL LRG LVL3 (GOWN DISPOSABLE)
GOWN STRL REUS W/TWL XL LVL3 (GOWN DISPOSABLE)
GRAFT BN 10X1XDBM MAGNIFUSE (Bone Implant) ×2 IMPLANT
GRAFT BONE MAGNIFUSE 1X10CM (Bone Implant) ×2 IMPLANT
HEMOSTAT POWDER KIT SURGIFOAM (HEMOSTASIS) ×12 IMPLANT
HEMOSTAT SURGICEL 2X14 (HEMOSTASIS) IMPLANT
KIT BASIN OR (CUSTOM PROCEDURE TRAY) ×2 IMPLANT
KIT TURNOVER KIT B (KITS) ×2 IMPLANT
NEEDLE HYPO 22GX1.5 SAFETY (NEEDLE) ×2 IMPLANT
NEEDLE HYPO 25X1 1.5 SAFETY (NEEDLE) ×2 IMPLANT
NEEDLE SPNL 20GX3.5 QUINCKE YW (NEEDLE) IMPLANT
NS IRRIG 1000ML POUR BTL (IV SOLUTION) ×2 IMPLANT
OIL CARTRIDGE MAESTRO DRILL (MISCELLANEOUS) ×2
PACK LAMINECTOMY NEURO (CUSTOM PROCEDURE TRAY) ×2 IMPLANT
PATTIES SURGICAL .5 X.5 (GAUZE/BANDAGES/DRESSINGS) IMPLANT
PATTIES SURGICAL .5 X3 (DISPOSABLE) ×2 IMPLANT
PATTIES SURGICAL 1X1 (DISPOSABLE) ×6 IMPLANT
ROD 140MM (Rod) ×4 IMPLANT
SCREW 5.75X40M (Screw) ×14 IMPLANT
SCREW 5.75X45MM (Screw) ×2 IMPLANT
SPONGE LAP 4X18 RFD (DISPOSABLE) IMPLANT
SPONGE SURGIFOAM ABS GEL 100 (HEMOSTASIS) ×2 IMPLANT
STAPLER VISISTAT 35W (STAPLE) ×2 IMPLANT
STRIP CLOSURE SKIN 1/2X4 (GAUZE/BANDAGES/DRESSINGS) ×2 IMPLANT
SUT BONE WAX W31G (SUTURE) IMPLANT
SUT ETHILON 4 0 PS 2 18 (SUTURE) ×2 IMPLANT
SUT NURALON 4 0 TR CR/8 (SUTURE) IMPLANT
SUT PROLENE 6 0 BV (SUTURE) IMPLANT
SUT VIC AB 0 CT1 18XCR BRD8 (SUTURE) ×1 IMPLANT
SUT VIC AB 0 CT1 8-18 (SUTURE) ×1
SUT VIC AB 2-0 CT1 18 (SUTURE) ×4 IMPLANT
SUT VIC AB 3-0 SH 8-18 (SUTURE) ×2 IMPLANT
SUT VICRYL 4-0 PS2 18IN ABS (SUTURE) IMPLANT
TOWEL GREEN STERILE (TOWEL DISPOSABLE) ×2 IMPLANT
TOWEL GREEN STERILE FF (TOWEL DISPOSABLE) ×2 IMPLANT
TRAY FOLEY MTR SLVR 16FR STAT (SET/KITS/TRAYS/PACK) IMPLANT
TUBE CONNECTING 12X1/4 (SUCTIONS) IMPLANT
WATER STERILE IRR 1000ML POUR (IV SOLUTION) ×2 IMPLANT

## 2020-01-04 NOTE — Transfer of Care (Signed)
Immediate Anesthesia Transfer of Care Note  Patient: Misty Arnold  Procedure(s) Performed: Thoracic Eight, Thoracic Nine THORACIC LAMINECTOMY FOR TUMOR with Thoracic Seven-Thoracic Ten instrumentation (N/A Spine Thoracic)  Patient Location: PACU  Anesthesia Type:General  Level of Consciousness: awake  Airway & Oxygen Therapy: Patient Spontanous Breathing and Patient connected to nasal cannula oxygen  Post-op Assessment: Report given to RN and Post -op Vital signs reviewed and stable  Post vital signs: Reviewed and stable  Last Vitals:  Vitals Value Taken Time  BP 139/93 01/04/20 1639  Temp    Pulse 84 01/04/20 1646  Resp 23 01/04/20 1646  SpO2 100 % 01/04/20 1646  Vitals shown include unvalidated device data.  Last Pain:  Vitals:   01/04/20 0452  TempSrc:   PainSc: 1          Complications: No apparent anesthesia complications

## 2020-01-04 NOTE — Brief Op Note (Signed)
01/04/2020  4:17 PM  PATIENT:  Misty Arnold  49 y.o. female  PRE-OPERATIVE DIAGNOSIS:  Tumor  POST-OPERATIVE DIAGNOSIS:  Tumor  PROCEDURE:  Procedure(s) with comments: Thoracic Eight, Thoracic Nine THORACIC LAMINECTOMY FOR TUMOR with Thoracic Seven-Thoracic Ten instrumentation (N/A) - Thoracic Eight, Thoracic Nine THORACIC LAMINECTOMY FOR TUMOR with Thoracic Seven-Thoracic Ten instrumentation  SURGEON:  Surgeon(s) and Role:    [name], MD - Primary  PHYSICIAN ASSISTANT:   ASSISTANTSMearl Latin   ANESTHESIA:   general  EBL:  4000 mL   BLOOD ADMINISTERED: 4 units packed cells, 2 units FFP  DRAINS: Medium Hemovac drain in the epidural space  LOCAL MEDICATIONS USED:  NONE  SPECIMEN:  Source of Specimen:  T8-T9 epidural tumor, history of breast carcinoma  DISPOSITION OF SPECIMEN:  PATHOLOGY  COUNTS:  YES  TOURNIQUET:  * No tourniquets in log *  DICTATION: .Dragon Dictation  PLAN OF CARE: Admit to inpatient   PATIENT DISPOSITION:  PACU - hemodynamically stable.   Delay start of Pharmacological VTE agent (>24hrs) due to surgical blood loss or risk of bleeding: yes

## 2020-01-04 NOTE — H&P (Signed)
Misty Arnold is an 49 y.o. female.   Chief Complaint: Metastatic cancer HPI: 49 year old female with known metastatic breast cancer.  Patient with malfunction to T9.  She is compliant with previous therapy secondary to family issues.  She is on chemotherapy.  She has not received radiation treatment to the spine.  She recently began difficulty with numbness paresthesias late approximately 3 days ago.  Symptoms have progressed.  She has noted progressive weakness.  She is now having sensory symptoms and weakness in her left lower extremity as well.  She remains continent of urine and stool.  She has back pain.  She is on no anticoagulation.  Past Medical History:  Diagnosis Date  . Abnormal uterine bleeding   . Anemia   . Breast cancer ([place])   . Depression   . Fibroid   . Hormone disorder   . Hypertension   . Infertility, female     Past Surgical History:  Procedure Laterality Date  . ABDOMINAL HYSTERECTOMY    . BREAST SURGERY    . MASTECTOMY    . salivary gland stone      Family History  Problem Relation Age of Onset  . Cancer Maternal Aunt        breast  . Stroke Maternal Aunt   . Heart attack Maternal Aunt   . Cancer Maternal Grandfather   . Colon cancer Maternal Grandfather   . Stroke Maternal Grandfather   . Cancer Paternal Grandfather   . Colon cancer Paternal Grandfather   . Stroke Mother   . Heart attack Mother   . Diabetes Mother   . Hyperlipidemia Mother   . Heart disease Mother   . Diabetes Father   . Stroke Father   . Stroke Paternal Grandmother   . Heart attack Paternal Grandmother   . Heart failure Paternal Grandmother   . Diabetes Paternal Grandmother   . Hyperlipidemia Paternal Grandmother   . CAD Paternal Grandmother   . Stroke Maternal Grandmother   . Breast cancer Maternal Aunt    Social History:  reports that she has never smoked. She has never used smokeless tobacco. She reports that she does not drink alcohol or use drugs.  Allergies:   Allergies  Allergen Reactions  . Penicillins Rash  . Amoxicillin     (Not in a hospital admission)   No results found for this or any previous visit (from the past 48 hour(s)). MR THORACIC SPINE W WO CONTRAST  Result Date: 01/04/2020 CLINICAL DATA:  Progressive sensation and weakness deficit in bilateral lower extremity right greater than left with saddle anesthesia. 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 EXAM: MRI THORACIC WITHOUT AND WITH CONTRAST TECHNIQUE: Multiplanar and multiecho pulse sequences of the thoracic spine were obtained without and with intravenous contrast. CONTRAST:  4.51mL GADAVIST GADOBUTROL 1 MMOL/ML IV SOLN COMPARISON:  CT of the chest August 21, 2019 FINDINGS: MRI THORACIC SPINE FINDINGS Alignment:  Physiologic. Correlation with prior CT show 12 rib-bearing vertebrae. The first rib bearing vertebra shows rudimentary ribs and is named T1 in this study, consistent with prior study numbering. Per this counting, there would be 6 cervical, 12 thoracic and 5 lumber type vertebrae. Careful correlation with this numbering strategy prior to any procedural intervention would be recommended. Vertebrae: There has been significant interval growth of the mass lesion involving the T8 and T9 vertebrae corresponding to known metastatic disease. The expansile lesion involves most of the T8 and T9 vertebral body including pedicles and posterior elements extending into the spinal canal  causing severe spinal canal stenosis and compression of the spinal cord which shows increased signal from the level of T8 through T10. There is also extension of the lesion to the ribcage on the right side, from T7 through T9, with severe narrowing of the right neural foramen from T7-8 through T9-10. Cord: Cord compression with increased T2 signal from T8 through T10, consistent with compressive myelopathy. Paraspinal and other soft tissues: Incidentally noted and partially the study is expansile mass lesion in the anterior  chest wall centered in the sternum, measuring approximately 7 x 5 x 3 cm, consistent with known metastatic lesion. Multiple small T2 hyperintense lesions in the liver are incidentally noted. Disc levels: Severe spinal canal stenosis with cord compression related to expansile lesion described above at the T8-T9 level with severe right-sided neural foraminal stenosis from T7 through T9. No significant disc bulge or herniation, spinal canal or neural foraminal stenosis in the other levels. IMPRESSION: 1. Please note this counting strategy prior to any procedural intervention would be recommended. 2. 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 3. Expansile mass in the anterior chest wall centered in the sternum, consistent with known metastatic lesion. Communicated to [employer] PA at 10:20 a.m. on 01/04/2020. Electronically Signed   By: Pedro Earls M.D.   On: 01/04/2020 10:25   MR LUMBAR SPINE W WO CONTRAST  Result Date: 01/04/2020 CLINICAL DATA:  Progressive neurological deficit in the bilateral lower extremity right greater than left. Question of chronic spinal compression. 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 EXAM: MRI LUMBAR SPINE WITHOUT AND WITH CONTRAST TECHNIQUE: Multiplanar and multiecho pulse sequences of the lumbar spine were obtained without and with intravenous contrast. CONTRAST:  4.48mL GADAVIST GADOBUTROL 1 MMOL/ML IV SOLN COMPARISON:  CT of the abdomen August 21 2019 FINDINGS: Segmentation:  Standard. Alignment:  Physiologic. Vertebrae: No fracture, evidence of discitis, or bone lesion. 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 Conus medullaris and cauda equina: Conus extends to the L2 level. Conus and cauda equina appear normal. Paraspinal and other soft tissues: Negative. Disc levels: T12-L1 through L3-4: No spinal canal or neural foraminal stenosis. L4-5: Shallow disc bulge with superimposed small left subarticular/foraminal disc protrusion causing mild narrowing of the left neural foramen and left subarticular zone. L5-S1: No spinal canal or neural foraminal stenosis. IMPRESSION: 1. No cauda equina compression or abnormal contrast enhancement. 2. 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 3. Mild degenerative disc disease at L4-L5 with mild narrowing of the left neural foramen and left subarticular zone. No high-grade spinal canal or neural foraminal stenosis. Electronically Signed   By: Pedro Earls M.D.   On: 01/04/2020 09:37    Pertinent items noted in HPI and remainder of comprehensive ROS otherwise negative.  Blood pressure 132/79, pulse 89, temperature 98.6 F (37 C), temperature source Oral, resp. rate 17, height 5\' 5"  (1.651 m), weight 49.9 kg, SpO2 98 %.  She is awake and alert.  She is oriented and appropriate.  Speech is fluent.  Her judgment and insight are intact.  Cranial nerve function normal bilaterally.  Motor examination extremities 5/5 bilateral extremities motor examination of the lower extremities 2/5right-sided hip flexor and right quadriceps.  She has flicker movement of her right dorsiflexors.  She has 2/5 strength in her right plantar flexors.  Motor strength of her left lower extremity, with 4-/5 strength of her left hip flexors, knee extensors, dorsiflexors, and plantar flexors, she has a relative sensory level around T8-9.  She has clonus in both ankles.   Toes are upgoing to plantar stimulation.  Examination head ears eyes nose and throat is unremarkable.  Chest is benign.  Abdomen is benign.  Extremities are free from injury or deformity. 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  I discussed situation with patient.  I do not believe that any radiation or chemotherapy along will be adequate to treat this problem.  I recommended we move forward with T8 and T9 decompressive laminectomy with resection of tumor.  Given the extensive bony destruction of his tumor I believe this will be further destabilizing of the segment of the spine.  For that reason I would also augment this with T7-T10 pedicle screw fixation.  I discussed the risks involved with surgery including the risk of anesthesia, bleeding, infection, CSF leak, nerve injury, further spinal pain, continued pain, tumor recurrence, need for adjunctive therapy, and nonbenefit.  Patient is aware of these risks and wishes to proceed.  Mallie Mussel A Diora Bellizzi 01/04/2020, 11:30 AM

## 2020-01-04 NOTE — Anesthesia Preprocedure Evaluation (Addendum)
Anesthesia Evaluation  Patient identified by MRN, date of birth, ID band Patient awake    Reviewed: Allergy & Precautions, H&P , NPO status , Patient's Chart, lab work & pertinent test results  Airway Mallampati: II  TM Distance: >3 FB Neck ROM: Full    Dental no notable dental hx. (+) Teeth Intact, Dental Advisory Given   Pulmonary neg pulmonary ROS,    Pulmonary exam normal breath sounds clear to auscultation       Cardiovascular hypertension, Pt. on medications  Rhythm:Regular Rate:Normal     Neuro/Psych Depression negative neurological ROS  negative psych ROS   GI/Hepatic negative GI ROS, Neg liver ROS,   Endo/Other  negative endocrine ROS  Renal/GU negative Renal ROS  negative genitourinary   Musculoskeletal   Abdominal   Peds  Hematology  (+) Blood dyscrasia, anemia ,   Anesthesia Other Findings   Reproductive/Obstetrics negative OB ROS                            Anesthesia Physical Anesthesia Plan  ASA: II and emergent  Anesthesia Plan: General   Post-op Pain Management:    Induction: Intravenous, Rapid sequence and Cricoid pressure planned  PONV Risk Score and Plan: 4 or greater and Ondansetron, Dexamethasone and Midazolam  Airway Management Planned: Oral ETT  Additional Equipment:   Intra-op Plan:   Post-operative Plan: Extubation in OR  Informed Consent: I have reviewed the patients History and Physical, chart, labs and discussed the procedure including the risks, benefits and alternatives for the proposed anesthesia with the patient or authorized representative who has indicated his/her understanding and acceptance.     Dental advisory given  Plan Discussed with: CRNA  Anesthesia Plan Comments:         Anesthesia Quick Evaluation

## 2020-01-04 NOTE — Progress Notes (Signed)
Old open wound on her right breast was bleeding, MD Pool notified and was ordered to apply a new pressure dressing of two ABD pads, guaze 4x4 and tape. Will continue to monitor and the floor nurse was notified.

## 2020-01-04 NOTE — ED Provider Notes (Addendum)
[facility] EMERGENCY DEPARTMENT Provider Note   CSN: [id number] Arrival date & time: 01/04/20  0319     History Chief Complaint  Patient presents with  . Numbness  . Allergic Reaction    Misty Arnold is a 49 y.o. female with a hx of metastatic breast cancer (Dr. Lindi Adie) presents to the Emergency Department as a transfer from [facility] in [place] complaining of gradual, persistent, progressively worsening right leg weakness onset 3 days ago that has progressed to bilateral lower extremity weakness and sensation deficits.  Patient reports that her initial symptoms started on Friday, 3 days ago as numbness in her right leg.  Since that time she has had a spread of decreased sensation and weakness to her left leg and several falls.  She denies loss of bowel or bladder control, however she does endorse saddle anesthesia.  Patient reports she received her chemotherapy Zoladex injection on Thursday which can cause peripheral neuropathy however patient reports she has been taking this for some time without symptoms such as this.  She denies recent illness, fever, chills.  No recent vaccines.  She has never had Guillain-Barr.  Nothing seems to make her symptoms better or worse.  Patient reports she has not had pain in her back until the CT scan at [facility].  Patient denies falls or known trauma prior to the initial onset of symptoms.  Records reviewed: Patient was last evaluated by Dr. Lindi Adie on 12/31/2019.  Initial breast cancer diagnosis in 2011 with right mastectomy in 2012. CT scan on 08/21/2019 showed Interval progression of left breast lesion.  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    The history is provided by the patient and medical records. No language interpreter was used.       Past Medical History:  Diagnosis Date  . Abnormal  uterine bleeding   . Anemia   . Breast cancer ([place])   . Depression   . Fibroid   . Hormone disorder   . Hypertension   . Infertility, female     Patient Active Problem List   Diagnosis Date Noted  . S/P laminectomy 01/04/2020  . 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 Breast cancer of upper-outer quadrant of right female breast ([place]) 11/02/2014    Past Surgical History:  Procedure Laterality Date  . ABDOMINAL HYSTERECTOMY    . BREAST SURGERY    . LAMINECTOMY N/A 01/04/2020   Procedure: Thoracic Eight, Thoracic Nine THORACIC LAMINECTOMY FOR TUMOR with Thoracic Seven-Thoracic Ten instrumentation;  Surgeon: [name], MD;  Location: [place];  Service: Neurosurgery;  Laterality: N/A;  Thoracic Eight, Thoracic Nine THORACIC LAMINECTOMY FOR TUMOR with Thoracic Seven-Thoracic Ten instrumentation  . MASTECTOMY    . salivary gland stone       OB History    Gravida  0   Para  0   Term  0   Preterm  0   AB  0   Living  0     SAB  0   TAB  0   Ectopic  0   Multiple  0   Live Births  0           Family History  Problem Relation Age of Onset  . Cancer Maternal Aunt        breast  . Stroke Maternal Aunt   . Heart  attack Maternal Aunt   . Cancer Maternal Grandfather   . Colon cancer Maternal Grandfather   . Stroke Maternal Grandfather   . Cancer Paternal Grandfather   . Colon cancer Paternal Grandfather   . Stroke Mother   . Heart attack Mother   . Diabetes Mother   . Hyperlipidemia Mother   . Heart disease Mother   . Diabetes Father   . Stroke Father   . Stroke Paternal Grandmother   . Heart attack Paternal Grandmother   . Heart failure Paternal Grandmother   . Diabetes Paternal Grandmother   . Hyperlipidemia Paternal Grandmother   . CAD Paternal Grandmother   . Stroke Maternal Grandmother   . Breast cancer Maternal Aunt     Social History   Tobacco Use  . Smoking status: Never Smoker  . Smokeless tobacco: Never Used   Substance Use Topics  . Alcohol use: No  . Drug use: No    Home Medications Prior to Admission medications   Medication Sig Start Date End Date Taking? Authorizing Provider  amLODipine (NORVASC) 10 MG tablet Take 10 mg by mouth daily.    Yes [provider]  Ascorbic Acid (VITAMIN C PO) Take by mouth.   Yes [provider]  Bioflavonoid Products (ESTER C PO) Take 1 tablet by mouth with breakfast, with lunch, and with evening meal.    Yes [provider]  BLACK COHOSH EXTRACT PO Take 1 tablet by mouth in the morning, at noon, and at bedtime.   Yes [provider]  BLACK CURRANT SEED OIL PO Take by mouth.   Yes [provider]  calcium carbonate (OS-CAL - DOSED IN MG OF ELEMENTAL CALCIUM) 1250 (500 Ca) MG tablet Take 1 tablet by mouth 3 (three) times daily with meals.   Yes [provider]  Cholecalciferol (VITAMIN D PO) Take 5,000 Units by mouth 2 (two) times daily.    Yes [provider]  Fish Oil OIL 5 mLs by Does not apply route in the morning, at noon, and at bedtime.    Yes [provider]  Flaxseed Oil OIL Take 5 mLs by mouth in the morning, at noon, and at bedtime.    Yes [provider]  goserelin (ZOLADEX) 3.6 MG injection Inject 3.6 mg into the skin every 28 (twenty-eight) days.   Yes [provider]  letrozole (FEMARA) 2.5 MG tablet Take 1 tablet (2.5 mg total) by mouth daily. 11/18/19  Yes Nicholas Lose, MD  lidocaine-prilocaine (EMLA) cream Apply 1 application topically as needed. Apply 1 hr prior to zoladex injection. 06/16/19  Yes Nicholas Lose, MD  MILK THISTLE PO Take 1 capsule by mouth in the morning, at noon, and at bedtime.   Yes [provider]  calcium-vitamin D (OSCAL WITH D) 500-200 MG-UNIT tablet Take 1 tablet by mouth 2 (two) times daily with a meal. Patient not taking: Reported on 01/04/2020 11/18/19   Nicholas Lose, MD  traMADol (ULTRAM) 50 MG tablet Take 1 tablet (50 mg  total) by mouth every 6 (six) hours as needed. Patient not taking: Reported on 01/04/2020 11/19/19   Nicholas Lose, MD    Allergies    Amoxicillin, Penicillins, and Tamoxifen  Review of Systems   Review of Systems  Constitutional: Negative for appetite change, diaphoresis, fatigue, fever and unexpected weight change.  HENT: Negative for mouth sores.   Eyes: Negative for visual disturbance.  Respiratory: Negative for cough, chest tightness, shortness of breath and wheezing.   Cardiovascular: Negative  for chest pain.  Gastrointestinal: Negative for abdominal pain, constipation, diarrhea, nausea and vomiting.  Endocrine: Negative for polydipsia, polyphagia and polyuria.  Genitourinary: Negative for dysuria, frequency, hematuria and urgency.  Musculoskeletal: Positive for back pain and gait problem. Negative for neck stiffness.  Skin: Negative for rash.  Allergic/Immunologic: Negative for immunocompromised state.  Neurological: Positive for weakness and numbness. Negative for syncope, light-headedness and headaches.  Hematological: Does not bruise/bleed easily.  Psychiatric/Behavioral: Negative for sleep disturbance. The patient is not nervous/anxious.     Physical Exam Updated Vital Signs BP (!) 142/88 (BP Location: Left Arm)   Pulse 98   Temp 98.6 F (37 C) (Oral)   Resp 17   Ht 5\' 5"  (1.651 m)   Wt 49.9 kg   SpO2 100%   BMI 18.30 kg/m   Physical Exam Vitals and nursing note reviewed.  Constitutional:      General: She is not in acute distress.    Appearance: She is not diaphoretic.  HENT:     Head: Normocephalic.  Eyes:     General: No scleral icterus.    Conjunctiva/sclera: Conjunctivae normal.  Cardiovascular:     Rate and Rhythm: Normal rate and regular rhythm.     Pulses: Normal pulses.          Radial pulses are 2+ on the right side and 2+ on the left side.  Pulmonary:     Effort: No tachypnea, accessory muscle usage, prolonged expiration, respiratory distress or  retractions.     Breath sounds: No stridor.     Comments: Equal chest rise. No increased work of breathing. Abdominal:     General: There is no distension.     Palpations: Abdomen is soft.     Tenderness: There is no abdominal tenderness. There is no guarding or rebound.  Musculoskeletal:     Cervical back: Normal range of motion.     Comments: Moves all extremities equally and without difficulty.  Skin:    General: Skin is warm and dry.     Capillary Refill: Capillary refill takes less than 2 seconds.  Neurological:     Mental Status: She is alert.     GCS: GCS eye subscore is 4. GCS verbal subscore is 5. GCS motor subscore is 6.     Sensory: Sensory deficit present.     Motor: Weakness present.     Comments: Speech is clear and goal oriented. Left lower extremity: Patient unable to lift left lower extremity off the bed, unable to extend foot or great toe.  Decreased sensation in the entire LLE.  No patellar reflex. Right lower extremity: Patient unable to lift right lower extremity off the bed, unable to extend foot or great toe.  No sensation in the right foot with progressive sensation to touch from the shin upward.  Diminished patellar tendon reflex.  Psychiatric:        Mood and Affect: Mood normal.     ED Results / Procedures / Treatments    Procedures Procedures (including critical care time)  Medications Ordered in ED Medications  0.9 %  sodium chloride infusion (Manually program via Guardrails IV Fluids) (has no administration in time range)  amLODipine (NORVASC) tablet 10 mg (10 mg Oral Given 01/07/20 0916)  calcium-vitamin D (OSCAL WITH D) 500-200 MG-UNIT per tablet 1 tablet (1 tablet Oral Given 01/07/20 1649)  cholecalciferol (VITAMIN D3) tablet 5,000 Units (5,000 Units Oral Given 01/07/20 2202)  sodium chloride flush (NS) 0.9 % injection 3 mL (3  mLs Intravenous Given 01/07/20 2204)  sodium chloride flush (NS) 0.9 % injection 3 mL (has no administration in time range)   0.9 %  sodium chloride infusion (has no administration in time range)  acetaminophen (TYLENOL) tablet 650 mg (has no administration in time range)    Or  acetaminophen (TYLENOL) suppository 650 mg (has no administration in time range)  HYDROcodone-acetaminophen (NORCO) 10-325 MG per tablet 1 tablet (1 tablet Oral Given 01/07/20 1536)  oxyCODONE (Oxy IR/ROXICODONE) immediate release tablet 10 mg (10 mg Oral Given 01/07/20 0403)  HYDROmorphone (DILAUDID) injection 1 mg (1 mg Intravenous Given 01/07/20 2326)  diazepam (VALIUM) tablet 5-10 mg (has no administration in time range)  polyethylene glycol (MIRALAX / GLYCOLAX) packet 17 g (17 g Oral Given 01/07/20 1140)  bisacodyl (DULCOLAX) suppository 10 mg (10 mg Rectal Given 01/07/20 1141)  sodium phosphate (FLEET) 7-19 GM/118ML enema 1 enema (has no administration in time range)  ondansetron (ZOFRAN) tablet 4 mg (has no administration in time range)    Or  ondansetron (ZOFRAN) injection 4 mg (has no administration in time range)  menthol-cetylpyridinium (CEPACOL) lozenge 3 mg (has no administration in time range)    Or  phenol (CHLORASEPTIC) mouth spray 1 spray (has no administration in time range)  0.9 % NaCl with KCl 20 mEq/ L  infusion ( Intravenous Stopped 01/05/20 1219)  dexamethasone (DECADRON) injection 4 mg (4 mg Intravenous Given 01/07/20 2322)    Or  dexamethasone (DECADRON) tablet 4 mg ( Oral See Alternative 01/07/20 2322)  Chlorhexidine Gluconate Cloth 2 % PADS 6 each (6 each Topical Given 01/07/20 0924)  ascorbic acid (VITAMIN C) tablet 250 mg (250 mg Oral Given 01/07/20 0914)  HYDROmorphone (DILAUDID) injection 0.5 mg (0.5 mg Intravenous Given 01/04/20 0411)  gadobutrol (GADAVIST) 1 MMOL/ML injection 4.9 mL (4.9 mLs Intravenous Contrast Given 01/04/20 0848)  HYDROmorphone (DILAUDID) injection 0.5 mg ( Intravenous MAR Unhold 01/04/20 1729)  dexamethasone (DECADRON) injection 10 mg ( Intravenous MAR Unhold 01/04/20 1729)  ceFAZolin (ANCEF) IVPB 2g/100 mL  premix (1 g Intravenous Given 01/04/20 1516)  ceFAZolin (ANCEF) IVPB 1 g/50 mL premix ( Intravenous Rate/Dose Verify 01/05/20 0800)    ED Course  I have reviewed the triage vital signs and the nursing notes.  Pertinent labs & imaging results that were available during my care of the patient were reviewed by me and considered in my medical decision making (see chart for details).  Clinical Course as of Jan 08 40  Mon Jan 04, 2020  1012 MR THORACIC SPINE W WO CONTRAST [AH]    Clinical Course User Index [AH] Margarita Mail, PA-C   MDM Rules/Calculators/A&P                       Presents as transfer with progressive sensation deficit and weakness to her bilateral lower extremities with right greater than left.  Patient additionally with saddle anesthesia.  No loss of bowel or bladder control.  Patient has known epidural tumor at T9.  Concern for cauda equina.  Less likely Guillain-Barr or multiple sclerosis.  Will start w/u with MRI given known mass.  Patient will need Rad/Onc and/or neurosurgical consult after MRI results.  6:58 AM At shift change care was transferred to [facility] who will follow pending studies, re-evaulate and determine disposition.    The patient was discussed with Dr. Dayna Barker who agrees with the treatment plan.  Final Clinical Impression(s) / ED Diagnoses Final diagnoses:  Weakness of both legs  Metastatic  breast cancer [facility])  Epidural mass    Rx / DC Orders ED Discharge Orders    None       Muthersbaugh, Gwenlyn Perking 01/04/20 [id number]    Mesner, Corene Cornea, MD 01/04/20 0705    Margarita Mail, PA-C 01/08/20 0041    Mesner, Corene Cornea, MD 01/08/20 [phone]

## 2020-01-04 NOTE — ED Triage Notes (Signed)
Pt is brought here transfer from [facility]. She was seen there today because of a "allergic reaction" post her Chemo-Shot she received this past Thursday. Pt states that Friday she began to have numbness and tingling that started in her feet and has now made it up to here butt and back . Pt came here with a 20GIV in her LAC.  142/92, 102HR, 98%RA, 18RR

## 2020-01-04 NOTE — Op Note (Signed)
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  Asst. Surgeon: Reinaldo Meeker, NP  Anesthesia: General  Indication: 49 year old female with known breast carcinoma metastatic to multiple regions including her T9 vertebral body.  This was first discovered in October.  She declined treatment.  The tumor has dramatically enlarged and she presents paraparetic to the emergency room today.  Patient presents now for emergent decompression and fusion in hopes of improving her symptoms.  Operative note: After induction anesthesia, patient position prone onto bolsters and appropriate padded.  Thoracic region prepped and draped sterilely.  Incision made from T6-T11.  Dissection performed bilaterally.  Epidural and paraspinal tumor was readily apparent.  This was extremely vascular and bled heavily through the exposure.  Decompressive laminectomy was then performed by removing inferior aspect of T7 the entire lamina of T8-T9 and the superior aspect of the lamina of T10.  Ligament flavum and epidural tumor were resected.  The dorsal aspect of the spinal canal was completely decompressed.  There is a large component of paraspinal tumor extending out along the rib heads of T8 and T9.  This once again bled heavily.  Eventually was controlled with packing and bipolar cautery.  Attention was placed to the right sided T9 pedicle.  This was further resected and was completely involved with tumor.  The vertebral bodies of T8 and T9 were then entered.  They were  cleaned of epidural tumor using the microscope for microdissection of the epidural space.  At this point a very thorough decompression both anteriorly and posteriorly had been achieved.  Pedicles of T6-T7 T10 and T11 were then identified using surface landmarks and intraoperative fluoroscopy.  Superficial bone was removed overlying the pedicle using high-speed drill.  Pedicle was then probed using a pedicle awl..  Each pedicle all track was probed and found to be solid within the bone.  Each pedicle tract was then tapped with a screw tap.  Each screw temple was probed and found to be solid within the bone.  5.75 mm radius brand screws from [employer] were placed bilaterally at T6-T7 T10 and T11.  Final images reveal good position of the hardware at the proper operative levels with stable alignment of the spine.  Short segment titanium rods and placed over the screw heads from T6-T11.  This was contoured.  Locking caps were then applied.  Locking caps were then engaged.  Gelfoam was placed over the laminectomy defect.  Transverse process and lamina were decorticated.  Morselized allograft was packed along the lamina and transverse processes.  A medium Hemovac drain was left in the epidural space.  Wound is then closed in layers of Vicryl sutures.  Steri-Strips and sterile dressing were applied.  No apparent complications.  Patient tolerated the procedure well and she returns to the recovery room postop.

## 2020-01-04 NOTE — Anesthesia Procedure Notes (Signed)
Procedure Name: Intubation Date/Time: 01/04/2020 12:59 PM Performed by: Griffin Dakin, CRNA Pre-anesthesia Checklist: Patient identified, Emergency Drugs available, Suction available and Patient being monitored Patient Re-evaluated:Patient Re-evaluated prior to induction Oxygen Delivery Method: Circle system utilized Preoxygenation: Pre-oxygenation with 100% oxygen Induction Type: IV induction Ventilation: Mask ventilation without difficulty Laryngoscope Size: Glidescope and 3 Grade View: Grade I Tube type: Oral Tube size: 7.0 mm Number of attempts: 1 Airway Equipment and Method: Oral airway,  Video-laryngoscopy and Rigid stylet Placement Confirmation: ETT inserted through vocal cords under direct vision,  positive ETCO2 and breath sounds checked- equal and bilateral Secured at: 22 cm Tube secured with: Tape Dental Injury: Teeth and Oropharynx as per pre-operative assessment

## 2020-01-04 NOTE — Anesthesia Procedure Notes (Signed)
Arterial Line Insertion Performed by: [place]ford Cage, CRNA, CRNA  Patient location: Pre-op. Emergency situation Right, radial was placed Catheter size: 20 G Hand hygiene performed  and Seldinger technique used  Attempts: 1 Procedure performed without using ultrasound guided technique. Following insertion, dressing applied and Biopatch. Post procedure assessment: normal and unchanged  Patient tolerated the procedure well with no immediate complications.

## 2020-01-04 NOTE — ED Provider Notes (Signed)
6:56 AM BP (!) 150/89 (BP Location: Left Arm)   Pulse 82   Temp 98.6 F (37 C) (Oral)   Resp 17   Ht 5\' 5"  (1.651 m)   Wt 49.9 kg   SpO2 100%   BMI 18.30 kg/m  Patient taken in signout from [facility] by at shift change.  This unfortunate 49 year old female with a history of metastatic breast cancer and known epidural mass around the T9 with progressively worsening bilateral lower extremity weakness, saddle anesthesia, BL lower extremity paralysis. No Left patellar reflexes. decreased R patellar reflexes. No evidence of urinary retention after in and out cath. No fecal incontinence.  Patient sent from [facility].   10:20 AM BP 132/79   Pulse 89   Temp 98.6 F (37 C) (Oral)   Resp 17   Ht 5\' 5"  (1.651 m)   Wt 49.9 kg   SpO2 98%   BMI 18.30 kg/m   I spoke with Dr. Norma Fredrickson about the findings of th MRI. On exam the patient has   total paralysis of the right leg, no appreciable Achilles or patellar reflexes, no myoclonus.  She has flexion extension with normal strength at the left ankle.  Sensation to light touch, in both legs with abnormal sensation on the right, normal proprioception.   11:04 AM BP 132/79   Pulse 89   Temp 98.6 F (37 C) (Oral)   Resp 17   Ht 5\' 5"  (1.651 m)   Wt 49.9 kg   SpO2 98%   BMI 18.30 kg/m  I spoke with Dr. Annette Stable who will consult on the patient in the ER.   Patient will be admitted for emergent surgical procedure for metastatic lesion causing severe cord compression at T9.  I discussed all the findings with the patient who agrees with plan of care.  Pain medication offered.  Patient stable for admission at this time     Margarita Mail, Hershal Coria 01/04/20 1624    Virgel Manifold, MD 01/05/20 [phone]

## 2020-01-04 NOTE — ED Notes (Signed)
PT in room 10.

## 2020-01-05 ENCOUNTER — Encounter (HOSPITAL_COMMUNITY): Payer: Self-pay | Admitting: Neurosurgery

## 2020-01-05 ENCOUNTER — Other Ambulatory Visit: Payer: Self-pay

## 2020-01-05 LAB — HEMOGLOBIN AND HEMATOCRIT, BLOOD
HCT: 27.8 % — ABNORMAL LOW (ref 36.0–46.0)
HCT: 28.1 % — ABNORMAL LOW (ref 36.0–46.0)
HCT: 29 % — ABNORMAL LOW (ref 36.0–46.0)
Hemoglobin: 10 g/dL — ABNORMAL LOW (ref 12.0–15.0)
Hemoglobin: 9.5 g/dL — ABNORMAL LOW (ref 12.0–15.0)
Hemoglobin: 9.6 g/dL — ABNORMAL LOW (ref 12.0–15.0)

## 2020-01-05 LAB — PREPARE RBC (CROSSMATCH)

## 2020-01-05 LAB — BPAM FFP
Blood Product Expiration Date: 202103062359
Blood Product Expiration Date: 202103062359
ISSUE DATE / TIME: 202103011515
ISSUE DATE / TIME: 202103011515
Unit Type and Rh: 1700
Unit Type and Rh: 7300

## 2020-01-05 LAB — PREPARE FRESH FROZEN PLASMA: Unit division: 0

## 2020-01-05 LAB — BLOOD PRODUCT ORDER (VERBAL) VERIFICATION

## 2020-01-05 LAB — BASIC METABOLIC PANEL
Anion gap: 7 (ref 5–15)
BUN: 16 mg/dL (ref 6–20)
CO2: 26 mmol/L (ref 22–32)
Calcium: 8.8 mg/dL — ABNORMAL LOW (ref 8.9–10.3)
Chloride: 103 mmol/L (ref 98–111)
Creatinine, Ser: 0.75 mg/dL (ref 0.44–1.00)
GFR calc Af Amer: >60 mL/min (ref >60)
GFR calc non Af Amer: >60 mL/min (ref >60)
Glucose, Bld: 142 mg/dL — ABNORMAL HIGH (ref 70–99)
Potassium: 4.6 mmol/L (ref 3.5–5.1)
Sodium: 136 mmol/L (ref 135–145)

## 2020-01-05 MED ORDER — ASCORBIC ACID 500 MG PO TABS
250.0000 mg | ORAL_TABLET | Freq: Every day | ORAL | Status: DC
  Administered 2020-01-05 – 2020-01-08 (×4): 250 mg via ORAL
  Filled 2020-01-05 (×4): qty 1

## 2020-01-05 MED FILL — Thrombin For Soln 5000 Unit: CUTANEOUS | Qty: 5000 | Status: AC

## 2020-01-05 NOTE — Evaluation (Addendum)
Physical Therapy Evaluation Patient Details Name: Misty Arnold MRN: [id number] DOB: May 05, 1971 Today's Date: 01/05/2020   History of Present Illness  Pt is a 49 y/o female with known metastatic breast CA and HTN.  Pt with metastatic tumor at T8/T9.  S/P T6-T11 PLA with morselized allograft and segmental pedical screw fixation.  Clinical Impression  Pt admitted with/for T8.9 metastatic tumor s/p resection and T6-T11 PLA.  Pt currently limited functionally due to the problems listed. ( See problems list.)   Pt will benefit from PT to maximize function and safety in order to get ready for next venue listed below.     Follow Up Recommendations CIR;Supervision/Assistance - 24 hour    Equipment Recommendations  Rolling walker with 5" wheels;3in1 (PT)    Recommendations for Other Services Rehab consult     Precautions / Restrictions Precautions Precautions: Back Precaution Booklet Issued: No      Mobility  Bed Mobility Overal bed mobility: Needs Assistance Bed Mobility: Rolling;Sidelying to Sit Rolling: Mod assist Sidelying to sit: Mod assist       General bed mobility comments: cued for log roll and transition side to sit.  needed assist bending up knees R> L and the rolling with truncal assist to sit up/.  Transfers Overall transfer level: Needs assistance   Transfers: Sit to/from Stand Sit to Stand: Mod assist;From elevated surface         General transfer comment: cues for hand placement.  Assist to come forward and up.  Ambulation/Gait             General Gait Details: weak and uncoordinated pivotal steps only  Stairs            Wheelchair Mobility    Modified Rankin (Stroke Patients Only)       Balance Overall balance assessment: Mild deficits observed, not formally tested;Needs assistance Sitting-balance support: No upper extremity supported;Bilateral upper extremity supported Sitting balance-Leahy Scale: Fair Sitting balance - Comments:  more confident with bil/single UE's due to numbness in buttocks.   Standing balance support: During functional activity;Bilateral upper extremity supported Standing balance-Leahy Scale: Poor Standing balance comment: pre gait and stance with mode assist and RW                             Pertinent Vitals/Pain Pain Assessment: 0-10 Pain Score: 5  Pain Location: back incision Pain Descriptors / Indicators: Discomfort;Grimacing;Sore Pain Intervention(s): Monitored during session;Premedicated before session    [place] expects to be discharged to:: Private residence Living Arrangements: Other relatives(Mom when out of rehab) Available Help at Discharge: Family;Available 24 hours/day(mom can't lift) Type of Home: House Home Access: Stairs to enter Entrance Stairs-Rails: None Entrance Stairs-Number of Steps: 2 Home Layout: Two level;Bed/bath upstairs Home Equipment: None      Prior Function Level of Independence: Independent(up until past Thursday)               Hand Dominance   Dominant Hand: Right    Extremity/Trunk Assessment   Upper Extremity Assessment Upper Extremity Assessment: Defer to OT evaluation    Lower Extremity Assessment Lower Extremity Assessment: Generalized weakness;RLE deficits/detail;LLE deficits/detail RLE Deficits / Details: gross movement only, numbness, decreased proprioception, RLE Coordination: decreased fine motor;decreased gross motor LLE Deficits / Details: generalized weakness, movement uncoordinated, decreased proprioception.    Cervical / Trunk Assessment Cervical / Trunk Assessment: Normal  Communication   Communication: No difficulties  Cognition Arousal/Alertness: Awake/alert Behavior During Therapy:  WFL for tasks assessed/performed Overall Cognitive Status: Within Functional Limits for tasks assessed                                        General Comments General comments (skin  integrity, edema, etc.): VSS, mildly tachy    Exercises     Assessment/Plan    PT Assessment Patient needs continued PT services  PT Problem List Decreased strength;Decreased activity tolerance;Decreased balance;Decreased mobility;Decreased coordination;Decreased knowledge of use of DME;Decreased knowledge of precautions;Impaired sensation;Pain       PT Treatment Interventions DME instruction;Gait training;Functional mobility training;Therapeutic activities;Balance training;Patient/family education    PT Goals (Current goals can be found in the Care Plan section)  Acute Rehab PT Goals Patient Stated Goal: back walking PT Goal Formulation: With patient Time For Goal Achievement: 01/19/20 Potential to Achieve Goals: Good    Frequency Min 5X/week   Barriers to discharge        Co-evaluation               AM-PAC PT "6 Clicks" Mobility  Outcome Measure Help needed turning from your back to your side while in a flat bed without using bedrails?: A Lot Help needed moving from lying on your back to sitting on the side of a flat bed without using bedrails?: A Lot Help needed moving to and from a bed to a chair (including a wheelchair)?: A Lot Help needed standing up from a chair using your arms (e.g., wheelchair or bedside chair)?: A Lot Help needed to walk in hospital room?: A Lot Help needed climbing 3-5 steps with a railing? : Total 6 Click Score: 11    End of Session   Activity Tolerance: Patient tolerated treatment well;Patient limited by pain Patient left: in chair;with call bell/phone within reach;with chair alarm set Nurse Communication: Mobility status;Precautions PT Visit Diagnosis: Other abnormalities of gait and mobility (R26.89);Pain;Unsteadiness on feet (R26.81);Muscle weakness (generalized) (M62.81) Pain - part of body: (back)    Time: [id number] PT Time Calculation (min) (ACUTE ONLY): 32 min   Charges:   PT Evaluation $PT Eval Moderate Complexity: 1  Mod PT Treatments $Therapeutic Activity: 8-22 mins        01/05/2020  Ginger Carne., PT Acute Rehabilitation Services [phone]  (pager) [phone]  (office)  Tessie Fass Ashlyn Cabler 01/05/2020, 12:19 PM

## 2020-01-05 NOTE — Anesthesia Postprocedure Evaluation (Signed)
Anesthesia Post Note  Patient: Misty Arnold  Procedure(s) Performed: Thoracic Eight, Thoracic Nine THORACIC LAMINECTOMY FOR TUMOR with Thoracic Seven-Thoracic Ten instrumentation (N/A Spine Thoracic)     Patient location during evaluation: PACU Anesthesia Type: General Level of consciousness: awake and alert Pain management: pain level controlled Vital Signs Assessment: post-procedure vital signs reviewed and stable Respiratory status: spontaneous breathing, nonlabored ventilation and respiratory function stable Cardiovascular status: blood pressure returned to baseline and stable Postop Assessment: no apparent nausea or vomiting Anesthetic complications: no    Last Vitals:  Vitals:   01/05/20 1200 01/05/20 1230  BP:  129/81  Pulse:  97  Resp:  14  Temp: 37 C   SpO2:  97%    Last Pain:  Vitals:   01/05/20 1200  TempSrc: Oral  PainSc:                  [name]

## 2020-01-05 NOTE — Progress Notes (Signed)
Providing Compassionate, Quality Care - Together   Subjective: Patient reports back pain. Nurse reports patient needed Dilaudid for breakthrough pain two times overnight and once this morning.  Objective: Vital signs in last 24 hours: Temp:  [97.9 F (36.6 C)-98.6 F (37 C)] 98.6 F (37 C) (03/02 1200) Pulse Rate:  [78-103] 91 (03/02 1100) Resp:  [8-21] 16 (03/02 1100) BP: (110-161)/(70-107) 127/79 (03/02 1100) SpO2:  [97 %-100 %] 100 % (03/02 1100) Arterial Line BP: (104-175)/(65-111) 144/72 (03/02 1000)  Intake/Output from previous day: 03/01 0701 - 03/02 0700 In: 5594.7 [I.V.:3384.8; Blood:1660; IV Piggyback:549.9] Out: [id number] [Urine:1460; Drains:325; Blood:4000] Intake/Output this shift: Total I/O In: 80.1 [I.V.:52.3; IV Piggyback:27.8] Out: -   Alert and oriented x 4 PERRLA CN II-XII grossly intact RUE, LUE strength and sensation intact LLE 4/5 R hip flexor 2/5, R plantar flexor 2/5, R dorsiflexion flicker Incision is covered with Honeycomb dressing and Steri Strips; Dressing is dry and intact. Scant amount of sanguinous drainage. Hemovac in place with 225 mL sanguinous drainage overnight   Lab Results: Recent Labs    01/04/20 1820 01/05/20 0012 01/05/20 0521 01/05/20 1031  WBC 12.7*  --   --   --   HGB 10.8*   < > 9.6* 9.5*  HCT 31.0*   < > 27.8* 28.1*  PLT 120*  --   --   --    < > = values in this interval not displayed.   BMET Recent Labs    01/04/20 1820 01/05/20 0521  NA 139 136  K 3.9 4.6  CL 106 103  CO2 26 26  GLUCOSE 182* 142*  BUN 16 16  CREATININE 0.75 0.75  CALCIUM 8.5* 8.8*    Studies/Results: DG Thoracic Spine 2 View  Result Date: 01/04/2020 CLINICAL DATA:  T8-9 laminectomy for tumor. T7-10 instrumentation. EXAM: THORACIC SPINE 2 VIEWS; DG C-ARM 1-60 MIN COMPARISON:  01/04/2020 MRI thoracic spine. FLUOROSCOPY TIME:  Fluoroscopy Time:  1 minutes 0 seconds Number of Acquired Spot Images: 2 FINDINGS: Nondiagnostic AP and lateral  intraoperative fluoroscopic thoracic spine radiographs demonstrate placement of bilateral pedicle screws at multiple thoracic spine levels, numbering of which is not possible on these coned views. Temperature probe noted in the thoracic esophagus. IMPRESSION: Intraoperative fluoroscopic guidance for thoracic spine surgery. Electronically Signed   By: Ilona Sorrel M.D.   On: 01/04/2020 16:40   MR THORACIC SPINE W WO CONTRAST  Result Date: 01/04/2020 CLINICAL DATA:  Progressive sensation and weakness deficit in bilateral lower extremity right greater than left with saddle anesthesia. 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 EXAM: MRI THORACIC WITHOUT AND WITH CONTRAST TECHNIQUE: Multiplanar and multiecho pulse sequences of the thoracic spine were obtained without and with intravenous contrast. CONTRAST:  4.41mL GADAVIST GADOBUTROL 1 MMOL/ML IV SOLN COMPARISON:  CT of the chest August 21, 2019 FINDINGS: MRI THORACIC SPINE FINDINGS Alignment:  Physiologic. Correlation with prior CT show 12 rib-bearing vertebrae. The first rib bearing vertebra shows rudimentary ribs and is named T1 in this study, consistent with prior study numbering. Per this counting, there would be 6 cervical, 12 thoracic and 5 lumber type vertebrae. Careful correlation with this numbering strategy prior to any procedural intervention would be recommended. Vertebrae: There has been significant interval growth of the mass lesion involving the T8 and T9 vertebrae corresponding to known metastatic disease. The expansile lesion involves most of the T8 and T9 vertebral body including pedicles and posterior elements extending into the spinal canal causing severe spinal canal stenosis and compression  of the spinal cord which shows increased signal from the level of T8 through T10. There is also extension of the lesion to the ribcage on the right side, from T7 through T9, with severe narrowing of the right neural foramen from T7-8 through T9-10. Cord: Cord compression  with increased T2 signal from T8 through T10, consistent with compressive myelopathy. Paraspinal and other soft tissues: Incidentally noted and partially the study is expansile mass lesion in the anterior chest wall centered in the sternum, measuring approximately 7 x 5 x 3 cm, consistent with known metastatic lesion. Multiple small T2 hyperintense lesions in the liver are incidentally noted. Disc levels: Severe spinal canal stenosis with cord compression related to expansile lesion described above at the T8-T9 level with severe right-sided neural foraminal stenosis from T7 through T9. No significant disc bulge or herniation, spinal canal or neural foraminal stenosis in the other levels. IMPRESSION: 1. Please note this counting strategy prior to any procedural intervention would be recommended. 2. 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 3. Expansile mass in the anterior chest wall centered in the sternum, consistent with known metastatic lesion. Communicated to [employer] PA at 10:20 a.m. on 01/04/2020. Electronically Signed   By: Pedro Earls M.D.   On: 01/04/2020 10:25   MR LUMBAR SPINE W WO CONTRAST  Result Date: 01/04/2020 CLINICAL DATA:  Progressive neurological deficit in the bilateral lower extremity right greater than left. Question of chronic spinal compression. 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 EXAM: MRI LUMBAR SPINE WITHOUT AND WITH CONTRAST TECHNIQUE: Multiplanar and multiecho pulse sequences of the lumbar spine were obtained without and with intravenous contrast. CONTRAST:  4.27mL GADAVIST GADOBUTROL 1 MMOL/ML IV SOLN COMPARISON:  CT of the abdomen August 21 2019 FINDINGS: Segmentation:  Standard. Alignment:  Physiologic. Vertebrae: No fracture, evidence of  discitis, or bone lesion. 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 Conus medullaris and cauda equina: Conus extends to the L2 level. Conus and cauda equina appear normal. Paraspinal and other soft tissues: Negative. Disc levels: T12-L1 through L3-4: No spinal canal or neural foraminal stenosis. L4-5: Shallow disc bulge with superimposed small left subarticular/foraminal disc protrusion causing mild narrowing of the left neural foramen and left subarticular zone. L5-S1: No spinal canal or neural foraminal stenosis. IMPRESSION: 1. No cauda equina compression or abnormal contrast enhancement. 2. 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 3. Mild degenerative disc disease at L4-L5 with mild narrowing of the left neural foramen and left subarticular zone. No high-grade spinal canal or neural foraminal stenosis. Electronically Signed   By: Pedro Earls M.D.   On: 01/04/2020 09:37   DG C-Arm 1-60 Min  Result Date: 01/04/2020 CLINICAL DATA:  T8-9 laminectomy for tumor. T7-10 instrumentation. EXAM: THORACIC SPINE 2 VIEWS; DG C-ARM 1-60 MIN COMPARISON:  01/04/2020 MRI thoracic spine. FLUOROSCOPY TIME:  Fluoroscopy Time:  1 minutes 0 seconds Number of Acquired Spot Images: 2 FINDINGS: Nondiagnostic AP and lateral intraoperative fluoroscopic thoracic spine radiographs demonstrate placement  of bilateral pedicle screws at multiple thoracic spine levels, numbering of which is not possible on these coned views. Temperature probe noted in the thoracic esophagus. IMPRESSION: Intraoperative fluoroscopic guidance for thoracic spine surgery.  Electronically Signed   By: Ilona Sorrel M.D.   On: 01/04/2020 16:40    Assessment/Plan: Patient is one day status post T7-T8 T9-T10 decompressive laminectomy with resection of tumor including right T9 transpedicular decompression with resection of tumor with T6-T7-T8 T9-T10-T11 posterior lateral arthrodesis by Dr. Annette Stable. Patient's surgery was complicated by blood loss and she was admitted to ICU for close monitoring post-operatively. Her creatinine is stable this morning. She has started working with therapies who are recommending CIR. Will maintain drain for now, given overnight output. Will trial foley removal with close monitoring for urinary retention. Patient can transfer to a med-surg unit at this time.   LOS: 1 day     Viona Gilmore, DNP, AGNP-C Nurse Practitioner  [facility] [place] [address], [address], [place], [place] [address] P: [phone]    F: [phone]  01/05/2020, 12:21 PM

## 2020-01-05 NOTE — Evaluation (Signed)
Occupational Therapy Evaluation Patient Details Name: Misty Arnold MRN: [id number] DOB: 09-12-1971 Today's Date: 01/05/2020    History of Present Illness Pt is a 49 y/o female with known metastatic breast CA and HTN.  Pt with metastatic tumor at T8/T9.  S/P T6-T11 PLA with morselized allograft and segmental pedical screw fixation.   Clinical Impression   PTA, pt was living with her grandmother and was independent; planning to dc to mother's home for increased support. Pt currently requiring Min A for UB ADLs, Max A for LB ADLs, and Mod A +2 for functional transfers. Pt presenting with decreased ROM, strength, balance, and safety. Highly motivated to return to PLOF. Pt would benefit from further acute OT to facilitate safe dc. Recommend dc to CIR for further OT to optimize safety, independence with ADLs, and return to PLOF.      Follow Up Recommendations  CIR    Equipment Recommendations  Other (comment)(Defer to next venue)    Recommendations for Other Services PT consult;Rehab consult     Precautions / Restrictions Precautions Precautions: Back Precaution Booklet Issued: No Precaution Comments: Reviewed back precautions.  Required Braces or Orthoses: Other Brace Other Brace: No brace needed per MD order Restrictions Weight Bearing Restrictions: No      Mobility Bed Mobility               General bed mobility comments: At [facility] upon arrival  Transfers Overall transfer level: Needs assistance Equipment used: 2 person hand held assist Transfers: Sit to/from [facility] Sit to Stand: Mod assist;From elevated surface Stand pivot transfers: Mod assist;+2 physical assistance       General transfer comment: Mod A for power up into standing and then to maintain balance during pivot to recliner    Balance Overall balance assessment: Mild deficits observed, not formally tested;Needs assistance Sitting-balance support: No upper extremity  supported;Bilateral upper extremity supported Sitting balance-Leahy Scale: Fair Sitting balance - Comments: more confident with bil/single UE's due to numbness in buttocks.   Standing balance support: During functional activity;Bilateral upper extremity supported Standing balance-Leahy Scale: Poor Standing balance comment: pre gait and stance with mode assist and RW                           ADL either performed or assessed with clinical judgement   ADL Overall ADL's : Needs assistance/impaired Eating/Feeding: Set up;Sitting   Grooming: Wash/dry face;Sitting;Supervision/safety;Set up   Upper Body Bathing: Minimal assistance;Sitting   Lower Body Bathing: Maximal assistance;Sit to/from stand   Upper Body Dressing : Minimal assistance;Sitting   Lower Body Dressing: Maximal assistance;Sit to/from stand   Toilet Transfer: Moderate assistance;+2 for physical assistance;+2 for safety/equipment;Stand-pivot;BSC           Functional mobility during ADLs: Moderate assistance;+2 for physical assistance(stand pivot only) General ADL Comments: Pt presenting with decreased balance, ROM, and safety     Vision         Perception     Praxis      Pertinent Vitals/Pain Pain Assessment: Faces Faces Pain Scale: Hurts little more Pain Location: back incision Pain Descriptors / Indicators: Discomfort;Grimacing;Sore Pain Intervention(s): Monitored during session;Limited activity within patient's tolerance;Repositioned     Hand Dominance Right   Extremity/Trunk Assessment Upper Extremity Assessment Upper Extremity Assessment: Generalized weakness   Lower Extremity Assessment Lower Extremity Assessment: Defer to PT evaluation RLE Deficits / Details: gross movement only, numbness, decreased proprioception, RLE Coordination: decreased fine motor;decreased gross motor LLE Deficits /  Details: generalized weakness, movement uncoordinated, decreased proprioception.   Cervical  / Trunk Assessment Cervical / Trunk Assessment: Other exceptions Cervical / Trunk Exceptions: s/p back sx   Communication Communication Communication: No difficulties   Cognition Arousal/Alertness: Awake/alert Behavior During Therapy: WFL for tasks assessed/performed Overall Cognitive Status: Within Functional Limits for tasks assessed                                     General Comments  VSS    Exercises     Shoulder Instructions      Home Living Family/patient expects to be discharged to:: Private residence Living Arrangements: Other relatives(Mom when out of rehab) Available Help at Discharge: Family;Available 24 hours/day(mom can't lift) Type of Home: House Home Access: Stairs to enter [employer] of Steps: 2 Entrance Stairs-Rails: None Home Layout: Two level;Bed/bath upstairs Alternate Level Stairs-Number of Steps: 1 flight   Bathroom Shower/Tub: Occupational psychologist: Standard     Home Equipment: None          Prior Functioning/Environment Level of Independence: Independent(up until past Thursday)                 OT Problem List: Decreased strength;Decreased range of motion;Decreased activity tolerance;Impaired balance (sitting and/or standing);Decreased knowledge of use of DME or AE;Decreased knowledge of precautions;Pain      OT Treatment/Interventions: Self-care/ADL training;Therapeutic exercise;Energy conservation;DME and/or AE instruction;Therapeutic activities;Patient/family education    OT Goals(Current goals can be found in the care plan section) Acute Rehab OT Goals Patient Stated Goal: Be independent again OT Goal Formulation: With patient Time For Goal Achievement: 01/19/20 Potential to Achieve Goals: Good  OT Frequency: Min 2X/week   Barriers to D/C:            Co-evaluation PT/OT/SLP Co-Evaluation/Treatment: Yes Reason for Co-Treatment: Other (comment);To address functional/ADL transfers(Dove  tail for transfer)   OT goals addressed during session: ADL's and self-care      AM-PAC OT "6 Clicks" Daily Activity     Outcome Measure Help from another person eating meals?: None Help from another person taking care of personal grooming?: A Little Help from another person toileting, which includes using toliet, bedpan, or urinal?: A Lot Help from another person bathing (including washing, rinsing, drying)?: A Lot Help from another person to put on and taking off regular upper body clothing?: A Little Help from another person to put on and taking off regular lower body clothing?: A Lot 6 Click Score: 16   End of Session Nurse Communication: Mobility status  Activity Tolerance: Patient tolerated treatment well Patient left: in chair;with call bell/phone within reach;with nursing/sitter in room  OT Visit Diagnosis: Unsteadiness on feet (R26.81);Other abnormalities of gait and mobility (R26.89);Pain Pain - part of body: (Back)                Time: [id number] OT Time Calculation (min): 15 min Charges:  OT General Charges $OT Visit: 1 Visit OT Evaluation $OT Eval Moderate Complexity: [place], OTR/L Acute Rehab Pager: [phone] Office: [place] 01/05/2020, 6:04 PM

## 2020-01-05 NOTE — Progress Notes (Signed)
Rehab Admissions Coordinator Note:  Per PT recommendation,this patient was screened by Raechel Ache for appropriateness for an Inpatient Acute Rehab Consult.  At this time, we are recommending an Inpatient Rehab consult. AC will contact MD to request order.   Raechel Ache 01/05/2020, 1:31 PM  I can be reached at [phone].

## 2020-01-06 ENCOUNTER — Encounter: Payer: Self-pay | Admitting: *Deleted

## 2020-01-06 ENCOUNTER — Ambulatory Visit
Admit: 2020-01-06 | Discharge: 2020-01-06 | Disposition: A | Discharge status: Home / Self Care | Payer: Medicare PPO | Attending: Radiation Oncology | Admitting: Radiation Oncology

## 2020-01-06 ENCOUNTER — Other Ambulatory Visit: Payer: Self-pay | Admitting: General Surgery

## 2020-01-06 ENCOUNTER — Other Ambulatory Visit: Payer: Self-pay | Admitting: Radiation Therapy

## 2020-01-06 DIAGNOSIS — C7949 Secondary malignant neoplasm of other parts of nervous system: Secondary | ICD-10-CM

## 2020-01-06 LAB — CBC
HCT: 26.9 % — ABNORMAL LOW (ref 36.0–46.0)
Hemoglobin: 9.2 g/dL — ABNORMAL LOW (ref 12.0–15.0)
MCH: 30.2 pg (ref 26.0–34.0)
MCHC: 34.2 g/dL (ref 30.0–36.0)
MCV: 88.2 fL (ref 80.0–100.0)
Platelets: 191 10*3/uL (ref 150–400)
RBC: 3.05 MIL/uL — ABNORMAL LOW (ref 3.87–5.11)
RDW: 13.2 % (ref 11.5–15.5)
WBC: 16.2 10*3/uL — ABNORMAL HIGH (ref 4.0–10.5)
nRBC: 0 % (ref 0.0–0.2)

## 2020-01-06 LAB — BASIC METABOLIC PANEL
Anion gap: 10 (ref 5–15)
BUN: 13 mg/dL (ref 6–20)
CO2: 26 mmol/L (ref 22–32)
Calcium: 8.7 mg/dL — ABNORMAL LOW (ref 8.9–10.3)
Chloride: 97 mmol/L — ABNORMAL LOW (ref 98–111)
Creatinine, Ser: 0.67 mg/dL (ref 0.44–1.00)
GFR calc Af Amer: >60 mL/min (ref >60)
GFR calc non Af Amer: >60 mL/min (ref >60)
Glucose, Bld: 197 mg/dL — ABNORMAL HIGH (ref 70–99)
Potassium: 4.4 mmol/L (ref 3.5–5.1)
Sodium: 133 mmol/L — ABNORMAL LOW (ref 135–145)

## 2020-01-06 NOTE — Progress Notes (Signed)
I concur with the assessment and med administration implemented and entered by Sheryn Bison, SN.

## 2020-01-06 NOTE — Progress Notes (Signed)
Radiation Oncology         [phone] ________________________________  Initial Inpatient Consultation  Name: Misty Arnold MRN: [id number]  Date: 01/06/2020  DOB: [birth year]  EG:[id number], [name]  [name], MD   REFERRING PHYSICIAN: [name], MD  DIAGNOSIS: The encounter diagnosis was Metastasis of neoplasm to spinal canal [facility]).  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 female who is seen as a courtesy of Dr. [name] for consideration for postoperative radiation therapy as part of management of patient's advanced metastatic breast cancer involving the spine.  the patient presented to the ED on 01/04/2020 with complaint of gradual, persistent, progressively worsening right leg weakness for four days. 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 It also showed mild degenerative disc disease at L4-L5 with mild narrowing of the left neural foramen and left subarticular zone. No high-grade spinal canal or neural foraminal stenosis. No cauda equina compression or abnormal contrast enhancement. 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 There was also an expansile mass in the anterior chest wall centered in the sternum, consistent  with known metastatic lesion (see history below). The patient was admitted for emergent surgical procedure for metastatic lesion.  The patient underwent T7-T8 and T9-T10 decompressive laminectomy with resection of rumor including right T9 transpedicular decompression with section of tumor and microdissection on 01/04/2020 under Dr. Annette Stable. Pathology report showed metastatic poorly differentiated carcinoma.  She experienced some blood loss and was admitted to the ICU for close monitoring post-operatively. Her pain has been well controlled and her lower extremity strength and sensation are much improved.  Of note, the patient has a history of right breast cancer initially diagnosed in November of 2011 and has been under the care of Dr. Lindi Adie. She underwent a mammogram on 09/21/2010 that showed right breast linear and segmental pleomorphic calcifications. Ultrasound-guided biopsy revealed grade 2 DCIS. The patient went to [place] for second opinion and delayed therapy.  On 01/26/2011, the patient underwent a right mastectomy followed by reconstruction. Pathology report showed invasive ductal carcinoma; ER/PR positive and Her-2 negative. BRCA negative. Oncotype DX low risk.  The patient then underwent right chest wall nodule excision on 06/24/2012 that showed metastatic carcinoma with positive margins. CT scan, however, did not show any metastatic disease. Chemotherapy was recommended but the patient refused. She also refused re-excision.  In June of 2014, the patient went to [place] for alternative treatments and took herbal medications, tonics, etc. However, she could not afford the trips for an extended period of time.  The patient had a breast MRI on 01/08/2014, which showed multiple enhancing masses of the right breast within the soft tissues, largest being 5.6 cm in wall skin surface and the capsule of the silicone prosthesis, contiguous nodules involving inferomedial breast  and sub-centimeter  nodules across the line.  PET scan on 11/12/2014 did not show any metastatic disease but showed extensive involvement in the right breast in and around the implant.  The patient underwent right mastectomy on 08/29/2015 at [facility] under Dr. Candiss Norse. Pathology report showed invasive ductal carcinoma with involvement of skin and skin ulceration, breast capsule involving cancer, and a positive superior medial margin. 1/1 lymph node was positive. Chest wall involvement. ER/PR positive and Her-2 negative.  The patient began anti-estrogen oral therapy with Tamoxifen 41m on 03/05/2016. This was discontinued on 03/17/2016 secondary to headaches and uncontrolled hypertension. The patient took an OTC estrogen metabolizer and was started on Aromasin in April of 2018 from a [place]. She was then started on Zoladex on 05/29/2017 and switched to Letrozole in November of 2018.  CT of chest/abdomen pelvis on 04/09/2018 showed enhancing nodules within the right chest wall compatible with locally recurrent disease. There were also large enhancing lymph nodes within the right axilla that were compatible with metastatic disease and enhancing masses within the upper-outer left breast that were concerning for either metastatic disease or primary breast malignancy. Right lateral seventh rib and manubrial osseous metastatic disease was seen. Finally, it showed indeterminate low-attenuation lesions within the liver and indeterminate pulmonary nodules.  CT of chest/abdomen/pelvis on 07/14/2018 showed chest wall recurrence and metastatic right axillary lymphadenopathy very similar to prior study. There was also an enhancing nodule in the left breast, as well as several osseous lesions in the thorax, also similar to the prior study. It also showed multiple tiny pulmonary nodules that were scattered throughout the lungs bilaterally measuring 5 mm or less in size. They were stable compared to prior study,  favored to be benign. No definite findings were seen to suggest metastatic disease in the abdomen or pelvis.   CT of chest/abdomen/pelvis on 08/21/2019 showed an interval development of new and progression of previously existing bulky metastatic disease in the bony anatomy and chest wall of the thorax. It also showed associated interval progression of enhancing left breast lesion. There was involvement of the right T9 vertebral body/pedicle and posterior right ninth rib involving the right T8-9 neuro-foramen and included a new epidural tumor in the right aspect of the spinal canal at the T8-9 level. Necrotic right subpectoral lymph node appeared stable. Tiny bilateral pulmonary nodules also appeared stable and were considered likely benign. Finally, it showed stable, tiny low-density lesion in the liver which were also considered benign in etiology. Palliative radiation therapy was recommended, which the patient declined.  The patient was last seen by Dr. GLindi Adieon 12/31/2019, during which time she was reporting worsening pain the chest wall and weight loss. The patient previously did not want any treatments regarding radiation or chemotherapy. However, with the progression of her disease, she is now willing to consider other treatment options. Dr. GLindi Adiealso recommended adding Ibrance to her current treatment plan.  PREVIOUS RADIATION THERAPY: No  PAST MEDICAL HISTORY:  Past Medical History:  Diagnosis Date  . Abnormal uterine bleeding   . Anemia   . Breast cancer ([place]   . Depression   . Fibroid   . Hormone disorder   . Hypertension   . Infertility, female     PAST SURGICAL HISTORY: Past Surgical History:  Procedure Laterality Date  . ABDOMINAL HYSTERECTOMY    . BREAST SURGERY    . LAMINECTOMY N/A 01/04/2020   Procedure: Thoracic Eight, Thoracic Nine THORACIC LAMINECTOMY FOR TUMOR with Thoracic Seven-Thoracic Ten instrumentation;  Surgeon: PEarnie Larsson  MD;  Location: [facility] [facility];  Service:  Neurosurgery;  Laterality: N/A;  Thoracic Eight, Thoracic Nine THORACIC LAMINECTOMY FOR TUMOR with Thoracic Seven-Thoracic Ten instrumentation  . MASTECTOMY    . salivary gland stone      FAMILY HISTORY:  Family History  Problem Relation Age of Onset  . Cancer Maternal Aunt        breast  . Stroke Maternal Aunt   . Heart attack Maternal Aunt   . Cancer Maternal Grandfather   . Colon cancer Maternal Grandfather   . Stroke Maternal Grandfather   . Cancer Paternal Grandfather   . Colon cancer Paternal Grandfather   . Stroke Mother   . Heart attack Mother   . Diabetes Mother   . Hyperlipidemia Mother   . Heart disease Mother   . Diabetes Father   . Stroke Father   . Stroke Paternal Grandmother   . Heart attack Paternal Grandmother   . Heart failure Paternal Grandmother   . Diabetes Paternal Grandmother   . Hyperlipidemia Paternal Grandmother   . CAD Paternal Grandmother   . Stroke Maternal Grandmother   . Breast cancer Maternal Aunt     SOCIAL HISTORY:  Social History   Tobacco Use  . Smoking status: Never Smoker  . Smokeless tobacco: Never Used  Substance Use Topics  . Alcohol use: No  . Drug use: No    ALLERGIES:  Allergies  Allergen Reactions  . Amoxicillin     Other reaction(s): rash/itching  . Penicillins Rash    Did it involve swelling of the face/tongue/throat, SOB, or low BP? no Did it involve sudden or severe rash/hives, skin peeling, or any reaction on the inside of your mouth or nose? Yes Did you need to seek medical attention at a hospital or doctor's office? No When did it last happen?early 20's If all above answers are "NO", may proceed with cephalosporin use.  . Tamoxifen Hypertension    MEDICATIONS:  No current facility-administered medications for this encounter.   No current outpatient medications on file.   Facility-Administered Medications Ordered in Other Encounters  Medication Dose Route Frequency Provider Last Rate Last Admin    . 0.9 %  sodium chloride infusion (Manually program via Guardrails IV Fluids)   Intravenous Once [name], MD      . 0.9 %  sodium chloride infusion  250 mL Intravenous Continuous Pool, Mallie Mussel, MD      . 0.9 % NaCl with KCl 20 mEq/ L  infusion   Intravenous Continuous [name], MD   Stopped at 01/05/20 1219  . acetaminophen (TYLENOL) tablet 650 mg  650 mg Oral Q4H PRN [name], MD       Or  . acetaminophen (TYLENOL) suppository 650 mg  650 mg Rectal Q4H PRN [name], MD      . amLODipine (NORVASC) tablet 10 mg  10 mg Oral Daily [name], MD   10 mg at 01/06/20 0924  . ascorbic acid (VITAMIN C) tablet 250 mg  250 mg Oral Daily Viona Gilmore D, NP   250 mg at 01/06/20 0924  . bisacodyl (DULCOLAX) suppository 10 mg  10 mg Rectal Daily PRN [name], MD   10 mg at 01/06/20 0914  . calcium-vitamin D (OSCAL WITH D) 500-200 MG-UNIT per tablet 1 tablet  1 tablet Oral BID WC [name], MD   1 tablet at 01/06/20 0913  . Chlorhexidine Gluconate Cloth 2 % PADS 6 each  6 each Topical Daily [name], MD  6 each at 01/05/20 1309  . cholecalciferol (VITAMIN D3) tablet 5,000 Units  5,000 Units Oral BID [name], MD   5,000 Units at 01/06/20 [phone]  . dexamethasone (DECADRON) injection 4 mg  4 mg Intravenous Q6H [name], MD   4 mg at 01/05/20 9604   Or  . dexamethasone (DECADRON) tablet 4 mg  4 mg Oral Q6H [name], MD   4 mg at 01/06/20 0549  . diazepam (VALIUM) tablet 5-10 mg  5-10 mg Oral Q6H PRN [name], MD      . goserelin (ZOLADEX) injection 3.6 mg  3.6 mg Subcutaneous Once Nicholas Lose, MD      . HYDROcodone-acetaminophen (NORCO) 10-325 MG per tablet 1 tablet  1 tablet Oral Q4H PRN [name], MD   1 tablet at 01/05/20 0349  . HYDROmorphone (DILAUDID) injection 1 mg  1 mg Intravenous Q2H PRN [name], MD   1 mg at 01/06/20 0914  . menthol-cetylpyridinium (CEPACOL) lozenge 3 mg  1 lozenge Oral PRN [name], MD       Or  . phenol (CHLORASEPTIC) mouth spray 1  spray  1 spray Mouth/Throat PRN [name], MD      . ondansetron [facility]) tablet 4 mg  4 mg Oral Q6H PRN [name], MD       Or  . ondansetron (ZOFRAN) injection 4 mg  4 mg Intravenous Q6H PRN [name], MD      . oxyCODONE (Oxy IR/ROXICODONE) immediate release tablet 10 mg  10 mg Oral Q3H PRN [name], MD   10 mg at 01/06/20 0549  . polyethylene glycol (MIRALAX / GLYCOLAX) packet 17 g  17 g Oral Daily PRN [name], MD   17 g at 01/06/20 0913  . sodium chloride flush (NS) 0.9 % injection 3 mL  3 mL Intravenous Q12H [name], MD   3 mL at 01/06/20 0925  . sodium chloride flush (NS) 0.9 % injection 3 mL  3 mL Intravenous PRN [name], MD      . sodium phosphate (FLEET) 7-19 GM/118ML enema 1 enema  1 enema Rectal Once PRN [name], MD        REVIEW OF SYSTEMS:  A 10+ POINT REVIEW OF SYSTEMS WAS OBTAINED including neurology, dermatology, psychiatry, cardiac, respiratory, lymph, extremities, GI, GU, musculoskeletal, constitutional, reproductive, HEENT.  Patient's pain is much improved since her surgery.  She reports initiating physical therapy today.  She reports not losing bowel or bladder continence prior to the surgery.    PHYSICAL EXAM:  Vitals - 1 value per visit 03/08/980  SYSTOLIC 191  DIASTOLIC 68  Pulse 478  Temperature 99.1  Respirations 17  Weight (lb)   Height   BMI   VISIT REPORT    Physical exam not performed today.  A phone interview was performed in light of COVID-19 issues.  Examination of the later date once the patient is ready to begin postop radiation therapy  ECOG = 2   LABORATORY DATA:  Lab Results  Component Value Date   WBC 16.2 (H) 01/06/2020   HGB 9.2 (L) 01/06/2020   HCT 26.9 (L) 01/06/2020   MCV 88.2 01/06/2020   PLT 191 01/06/2020   NEUTROABS 2.9 01/15/2019   Lab Results  Component Value Date   NA 133 (L) 01/06/2020   K 4.4 01/06/2020   CL 97 (L) 01/06/2020   CO2 26 01/06/2020   GLUCOSE 197 (H) 01/06/2020   CREATININE 0.67  01/06/2020   CALCIUM  8.7 (L) 01/06/2020      RADIOGRAPHY: DG Thoracic Spine 2 View  Result Date: 01/04/2020 CLINICAL DATA:  T8-9 laminectomy for tumor. T7-10 instrumentation. EXAM: THORACIC SPINE 2 VIEWS; DG C-ARM 1-60 MIN COMPARISON:  01/04/2020 MRI thoracic spine. FLUOROSCOPY TIME:  Fluoroscopy Time:  1 minutes 0 seconds Number of Acquired Spot Images: 2 FINDINGS: Nondiagnostic AP and lateral intraoperative fluoroscopic thoracic spine radiographs demonstrate placement of bilateral pedicle screws at multiple thoracic spine levels, numbering of which is not possible on these coned views. Temperature probe noted in the thoracic esophagus. IMPRESSION: Intraoperative fluoroscopic guidance for thoracic spine surgery. Electronically Signed   By: Ilona Sorrel M.D.   On: 01/04/2020 16:40   MR THORACIC SPINE W WO CONTRAST  Result Date: 01/04/2020 CLINICAL DATA:  Progressive sensation and weakness deficit in bilateral lower extremity right greater than left with saddle anesthesia. 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 EXAM: MRI THORACIC WITHOUT AND WITH CONTRAST TECHNIQUE: Multiplanar and multiecho pulse sequences of the thoracic spine were obtained without and with intravenous contrast. CONTRAST:  4.25m GADAVIST GADOBUTROL 1 MMOL/ML IV SOLN COMPARISON:  CT of the chest August 21, 2019 FINDINGS: MRI THORACIC SPINE FINDINGS Alignment:  Physiologic. Correlation with prior CT show 12 rib-bearing vertebrae. The first rib bearing vertebra shows rudimentary ribs and is named T1 in this study, consistent with prior study numbering. Per this counting, there would be 6 cervical, 12 thoracic and 5 lumber type vertebrae. Careful correlation with this numbering strategy prior to any procedural intervention would be recommended. Vertebrae: There has been significant interval growth of the mass lesion involving the T8 and T9 vertebrae corresponding to known metastatic disease. The expansile lesion involves most of the T8 and T9 vertebral  body including pedicles and posterior elements extending into the spinal canal causing severe spinal canal stenosis and compression of the spinal cord which shows increased signal from the level of T8 through T10. There is also extension of the lesion to the ribcage on the right side, from T7 through T9, with severe narrowing of the right neural foramen from T7-8 through T9-10. Cord: Cord compression with increased T2 signal from T8 through T10, consistent with compressive myelopathy. Paraspinal and other soft tissues: Incidentally noted and partially the study is expansile mass lesion in the anterior chest wall centered in the sternum, measuring approximately 7 x 5 x 3 cm, consistent with known metastatic lesion. Multiple small T2 hyperintense lesions in the liver are incidentally noted. Disc levels: Severe spinal canal stenosis with cord compression related to expansile lesion described above at the T8-T9 level with severe right-sided neural foraminal stenosis from T7 through T9. No significant disc bulge or herniation, spinal canal or neural foraminal stenosis in the other levels. IMPRESSION: 1. Please note this counting strategy prior to any procedural intervention would be recommended. 2. 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 3. Expansile mass in the anterior chest wall centered in the sternum, consistent with known metastatic lesion. Communicated to [employer] at 10:20 a.m. on 01/04/2020. Electronically Signed   By: KPedro EarlsM.D.   On: 01/04/2020 10:25   MR LUMBAR SPINE W WO CONTRAST  Result Date: 01/04/2020 CLINICAL DATA:  Progressive neurological deficit in the bilateral lower extremity right greater than left. Question of chronic spinal compression.  Known epidural tumor  at T9. EXAM: MRI LUMBAR SPINE WITHOUT AND WITH CONTRAST TECHNIQUE: Multiplanar and multiecho pulse sequences of the lumbar spine were obtained without and with intravenous contrast. CONTRAST:  4.21m GADAVIST GADOBUTROL 1 MMOL/ML IV SOLN COMPARISON:  CT of the abdomen August 21 2019 FINDINGS: Segmentation:  Standard. Alignment:  Physiologic. Vertebrae: No fracture, evidence of discitis, or bone lesion. 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 Conus medullaris and cauda equina: Conus extends to the L2 level. Conus and cauda equina appear normal. Paraspinal and other soft tissues: Negative. Disc levels: T12-L1 through L3-4: No spinal canal or neural foraminal stenosis. L4-5: Shallow disc bulge with superimposed small left subarticular/foraminal disc protrusion causing mild narrowing of the left neural foramen and left subarticular zone. L5-S1: No spinal canal or neural foraminal stenosis. IMPRESSION: 1. No cauda equina compression or abnormal contrast enhancement. 2. 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 3. Mild degenerative disc disease at L4-L5 with mild narrowing of the left neural foramen and left subarticular zone. No high-grade spinal canal or neural foraminal stenosis. Electronically Signed   By: KPedro EarlsM.D.   On: 01/04/2020 09:37   DG C-Arm 1-60 Min  Result Date: 01/04/2020 CLINICAL DATA:  T8-9 laminectomy for tumor. T7-10 instrumentation. EXAM: THORACIC SPINE 2 VIEWS; DG C-ARM 1-60 MIN COMPARISON:  01/04/2020 MRI thoracic spine. FLUOROSCOPY TIME:  Fluoroscopy Time:  1 minutes 0 seconds  Number of Acquired Spot Images: 2 FINDINGS: Nondiagnostic AP and lateral intraoperative fluoroscopic thoracic spine radiographs demonstrate placement of bilateral pedicle screws at multiple thoracic spine levels, numbering of which is not possible on these coned views. Temperature probe noted in the thoracic esophagus. IMPRESSION: Intraoperative fluoroscopic guidance for thoracic spine surgery. Electronically Signed   By: JIlona SorrelM.D.   On: 01/04/2020 16:40      IMPRESSION: Metastatic poorly differentiated carcinoma  Patient has undergone successful decompression of her spinal cord compression.  As above her pain and neurologic symptoms have improved significantly.  Patient will be a good candidate for postop radiation therapy..  We discussed the general course of postop radiation therapy anticipated side effects potential complications.  The patient appears to understand and wishes to proceed with planned course of radiation therapy.   PLAN: Patient will be set up for postop radiation therapy to begin 2 to 3 weeks after surgery depending on her recommendations from neurosurgery and postop recovery.  She may potentially be in the inpatient rehab unit at that time will transfer over for daily radiation therapy if necessary.  Anticipate 3 weeks of radiation therapy on a daily basis.    ------------------------------------------------  JBlair Promise PhD, MD  This document serves as a record of services personally performed by JGery Pray MD. It was created on his behalf by MClerance Lav a trained medical scribe. The creation of this record is based on the scribe's personal observations and the provider's statements to them. This document has been checked and approved by the attending provider.

## 2020-01-06 NOTE — Progress Notes (Signed)
Physical Therapy Treatment Patient Details Name: Misty Arnold MRN: [id number] DOB: November 26, 1970 Today's Date: 01/06/2020    History of Present Illness Pt is a 49 y/o female with known metastatic breast CA and HTN.  Pt with metastatic tumor at T8/T9.  S/P T6-T11 PLA with morselized allograft and segmental pedical screw fixation.    PT Comments    Pt seated in recliner and very eager to mobilize.  Pt is progressing well as evident by gt trial.  She required moderate assistance to mobilize and continues to benefit from aggressive rehab in a post acute setting.  Plan next session for continued gt and transfer training.  She is limited by decreased strength and coordination.     Follow Up Recommendations  CIR;Supervision/Assistance - 24 hour     Equipment Recommendations  Rolling walker with 5" wheels;3in1 (PT)    Recommendations for Other Services Rehab consult     Precautions / Restrictions Precautions Precautions: Back Precaution Booklet Issued: No Other Brace: No brace needed per MD order Restrictions Weight Bearing Restrictions: No    Mobility  Bed Mobility               General bed mobility comments: Pt seated in recliner on arrival.  Transfers Overall transfer level: Needs assistance Equipment used: Rolling walker (2 wheeled) Transfers: Sit to/from Stand Sit to Stand: Mod assist;From elevated surface         General transfer comment: Cues to scoot forward and for hand and foot placement.  Assistance to weight shift forward and power up into standing.  Ambulation/Gait Ambulation/Gait assistance: Mod assist Gait Distance (Feet): 50 Feet Assistive device: Rolling walker (2 wheeled) Gait Pattern/deviations: Step-through pattern;Wide base of support;Ataxic;Trunk flexed;Shuffle;Scissoring     General Gait Details: weak and uncoordinated steps with B weakness.  Close chair follow for safety.   Stairs             Wheelchair Mobility    Modified  Rankin (Stroke Patients Only)       Balance Overall balance assessment: Needs assistance   Sitting balance-Leahy Scale: Fair       Standing balance-Leahy Scale: Poor                              Cognition Arousal/Alertness: Awake/alert Behavior During Therapy: WFL for tasks assessed/performed Overall Cognitive Status: Within Functional Limits for tasks assessed                                        Exercises      General Comments        Pertinent Vitals/Pain Pain Assessment: Faces Faces Pain Scale: Hurts little more Pain Location: back incision Pain Descriptors / Indicators: Discomfort;Grimacing;Sore Pain Intervention(s): Monitored during session;Repositioned    Home Living                      Prior Function            PT Goals (current goals can now be found in the care plan section) Acute Rehab PT Goals Patient Stated Goal: Be independent again Potential to Achieve Goals: Good Progress towards PT goals: Progressing toward goals    Frequency    Min 5X/week      PT Plan Current plan remains appropriate    Co-evaluation  AM-PAC PT "6 Clicks" Mobility   Outcome Measure  Help needed turning from your back to your side while in a flat bed without using bedrails?: A Lot Help needed moving from lying on your back to sitting on the side of a flat bed without using bedrails?: A Lot Help needed moving to and from a bed to a chair (including a wheelchair)?: A Lot Help needed standing up from a chair using your arms (e.g., wheelchair or bedside chair)?: A Lot Help needed to walk in hospital room?: A Lot Help needed climbing 3-5 steps with a railing? : Total 6 Click Score: 11    End of Session Equipment Utilized During Treatment: Gait belt Activity Tolerance: Patient tolerated treatment well;Patient limited by pain Patient left: in chair;with call bell/phone within reach;with chair alarm set Nurse  Communication: Mobility status;Precautions PT Visit Diagnosis: Other abnormalities of gait and mobility (R26.89);Pain;Unsteadiness on feet (R26.81);Muscle weakness (generalized) (M62.81) Pain - part of body: (back)     Time: [id number] PT Time Calculation (min) (ACUTE ONLY): 22 min  Charges:  $Gait Training: 8-22 mins                     Erasmo Leventhal , PTA Acute Rehabilitation Services Pager [phone] Office [phone]     Dantae Meunier Eli Hose 01/06/2020, 4:31 PM

## 2020-01-06 NOTE — Progress Notes (Signed)
Overall patient is doing significantly better.  Her pain is very well controlled.  Her lower extremity sensation and strength are much improved from preop.  She is afebrile.  Blood pressure and heart rate are normal.  She is saturating well.  Urine output is good.  Drain output moderate.  Awake and alert.  Oriented and appropriate.  Motor examination with 4/5 strength in her right lower extremity.  4+/5 strength in her left lower extremity.  All very much improved from preop.  Dressing clean and dry.  Abdomen soft.  Overall making good progress.  Check follow-up labs.  Mobilize with therapy.  I agree patient would benefit from inpatient rehabilitation.  She will require postoperative radiation therapy and further chemotherapy over the next few weeks.

## 2020-01-06 NOTE — Plan of Care (Signed)
  Problem: Activity: Goal: Risk for activity intolerance will decrease Outcome: Progressing   Problem: Clinical Measurements: Goal: Will remain free from infection Outcome: Progressing   Problem: Coping: Goal: Level of anxiety will decrease Outcome: Progressing

## 2020-01-06 NOTE — Consult Note (Signed)
[place] Nurse Consult Note: Reason for Consult:Right breast mastectomy site, fungating breast lesion. Wound type:Neoplastic Pressure Injury POA: N/A Measurement: 12cm x 7cm with scattered depths, none greater than 0.2cm Wound bed: 80% red, 20% yellow with elevations and crevices (irregular wound bed) Drainage (amount, consistency, odor) small to moderate with strong odor consistent with neoplastic lesions Periwound: intact, dry Dressing procedure/placement/frequency: I will implement a conservative POC consisting of a nonadherent wound contact layer that is antimicrobial and drying. We will top with with an ABD pad for comfort and secure with Kerlix roll gauze and top with an ACE. Changes will be twice daily; when possible (as dictated by assistance available or as odor stabilizes), may decrease to once daily. Noted are the plans for Rehab facility and continuation of care including chemotherapy and irradiation therapy. If the petrolatum based xeroform in the right breat area is incompatible with future irradiation, consideration for a silicone based wound contact layer (Mepitel, [place] # [phone]) would be advised. It will have no impact on odor, but will leave no residual.  Additionally, for dressing securement downstream, a breast binder could also be considered.   [place] nursing team will not follow, but will remain available to this patient, the nursing and medical teams.  Please re-consult if needed. Thanks, Maudie Flakes, MSN, RN, [place], Arther Abbott  Pager# [phone]

## 2020-01-06 NOTE — Progress Notes (Signed)
Inpatient Rehab Admissions:  Inpatient Rehab Consult received.  I met with pt at the bedside for rehabilitation assessment. We reviewed program details, ELOS, and expected functional outcomes. Pt very interested in this program but is currently unsure about her DC plans. Recommended pt speak with her family and friends tonight to come up with a stable DC plan. If pt is able to secure assistance at DC, [facility] will begin insurance authorization process for possible admit. Will follow up with her tomorrow AM. Pt very grateful for information.   Raechel Ache, OTR/L  Rehab Admissions Coordinator  [phone] 01/06/2020 3:13 PM

## 2020-01-07 LAB — SURGICAL PATHOLOGY

## 2020-01-07 NOTE — Progress Notes (Signed)
Postop day 3.  Patient continues to progress very well.  Her pain is minimal.  Her lower extremity strength and sensation are much improved.  She was able to ambulate with therapy yesterday.  She is afebrile.  Her vital signs are stable.  Her urine output is good.  Drain output is minimal.  She is awake and alert.  She is oriented and appropriate.  Lab studies are stable.  Motor and sensory function of her lower extremities continue to improve.  Right lower extremity strength now 4+/5.  Beginning to have some dorsiflexion her right foot.  Left lower extremity strength near normal.  Wound clean and dry.  Progressing very well.  Continue efforts at mobilization and rehab.  Patient stable medically for inpatient rehabilitation when bed available.  I appreciate Dr. Christin Bach note with regard to plan for postoperative radiation therapy.

## 2020-01-07 NOTE — Progress Notes (Signed)
Occupational Therapy Treatment Patient Details Name: Misty Arnold MRN: [id number] DOB: [birth year] Today's Date: 01/07/2020    History of present illness Pt is a 49 y/o female with known metastatic breast CA and HTN.  Pt with metastatic tumor at T8/T9.  S/P T6-T11 PLA with morselized allograft and segmental pedical screw fixation.   OT comments  Patient continues to make steady progress towards goals in skilled OT session. Patient's session encompassed ADLs at the sink, reiteration of back precautions and overall education to complete functional mobility safely. Pt continues to be greatly motivated to participate in therapy and was able to recite all back precautions independently and work though strategies to complete ADLs in adherence to precautions. Pt with increased balance and mobility in session requiring min verbal cues for sequencing and walker navigation. Pt remains an excellent candidate for CIR as due to pt's previous level of independence and motivation; will follow acutely.    Follow Up Recommendations  CIR    Equipment Recommendations  Other (comment)(Defer to next venue of care)    Recommendations for Other Services      Precautions / Restrictions Precautions Precautions: Back Precaution Booklet Issued: No Precaution Comments: Reviewed back precautions.  Required Braces or Orthoses: Other Brace Other Brace: No brace needed per MD order Restrictions Weight Bearing Restrictions: No       Mobility Bed Mobility Overal bed mobility: Needs Assistance             General bed mobility comments: Sitting EOB upon arrival  Transfers Overall transfer level: Needs assistance Equipment used: Rolling walker (2 wheeled) Transfers: Sit to/from Stand Sit to Stand: Min assist;From elevated surface Stand pivot transfers: Min assist;Mod assist       General transfer comment: Min Mod for transfers and mobility, requires increased time    Balance Overall balance  assessment: Needs assistance Sitting-balance support: No upper extremity supported;Bilateral upper extremity supported Sitting balance-Leahy Scale: Fair     Standing balance support: During functional activity;Single extremity supported Standing balance-Leahy Scale: Fair Standing balance comment: braced on sink when completing functional tasks                           ADL either performed or assessed with clinical judgement   ADL Overall ADL's : Needs assistance/impaired     Grooming: Wash/dry face;Sitting;Supervision/safety;Set up;Oral care;Min guard Grooming Details (indicate cue type and reason): Completed oral care in standing at sink, washing face in sitting                 Toilet Transfer: Min guard;Cueing for safety Toilet Transfer Details (indicate cue type and reason): Simlated with sit<>stands from bed and chair         Functional mobility during ADLs: Minimal assistance;Min guard General ADL Comments: Pt continues to have decreased balance but improving greatly     Vision       Perception     Praxis      Cognition Arousal/Alertness: Awake/alert Behavior During Therapy: WFL for tasks assessed/performed Overall Cognitive Status: Within Functional Limits for tasks assessed                                          Exercises     Shoulder Instructions       General Comments      Pertinent Vitals/ Pain  Pain Assessment: 0-10 Pain Score: 7  Pain Location: back incision Pain Descriptors / Indicators: Discomfort;Grimacing;Sore Pain Intervention(s): Limited activity within patient's tolerance;Monitored during session;Repositioned;Relaxation;Patient requesting pain meds-RN notified  Home Living                                          Prior Functioning/Environment              Frequency  Min 2X/week        Progress Toward Goals  OT Goals(current goals can now be found in the care plan  section)  Progress towards OT goals: Progressing toward goals  Acute Rehab OT Goals Patient Stated Goal: Be independent again OT Goal Formulation: With patient Time For Goal Achievement: 01/19/20 Potential to Achieve Goals: Good  Plan Discharge plan remains appropriate    Co-evaluation          OT goals addressed during session: ADL's and self-care      AM-PAC OT "6 Clicks" Daily Activity     Outcome Measure   Help from another person eating meals?: None Help from another person taking care of personal grooming?: A Little Help from another person toileting, which includes using toliet, bedpan, or urinal?: A Little Help from another person bathing (including washing, rinsing, drying)?: A Little Help from another person to put on and taking off regular upper body clothing?: A Little Help from another person to put on and taking off regular lower body clothing?: A Lot 6 Click Score: 18    End of Session Equipment Utilized During Treatment: Gait belt;Rolling walker  OT Visit Diagnosis: Unsteadiness on feet (R26.81);Other abnormalities of gait and mobility (R26.89);Pain Pain - part of body: (Back)   Activity Tolerance Patient tolerated treatment well   Patient Left in bed;with call bell/phone within reach;with nursing/sitter in room   Nurse Communication Mobility status        Time: [id number] OT Time Calculation (min): 26 min  Charges: OT General Charges $OT Visit: 1 Visit OT Treatments $Self Care/Home Management : 23-37 mins  [place]. Tawan Corkern, COTA/L Acute Rehabilitation Services [phone] [place] 01/07/2020, 12:50 PM

## 2020-01-07 NOTE — Telephone Encounter (Signed)
Oral Chemotherapy Pharmacist Encounter   Wynn Maudlin, patient advocate, made multiple attempts to contact Ms. Bissette via telephone. She noticed that Mr. Floyd was admitted. I looks like she had some neurosurgery and there is a plan for her to have post discharged radiation.  Contacted Dr. Lindi Adie and given the change in patient status since the Ibrance was prescribed. He would like to hold on proceeding with Ibrance until she has finished with radiation. I told him to notify use when he would like to proceed with the Ibrance.  Darl Pikes, PharmD, BCPS, [facility] Hematology/Oncology Clinical Pharmacist [facility]/HP/AP Oral [facility] [phone]  01/07/2020 12:42 PM

## 2020-01-07 NOTE — Progress Notes (Signed)
Physical Therapy Treatment Patient Details Name: Misty Arnold MRN: [id number] DOB: 13-Apr-1971 Today's Date: 01/07/2020    History of Present Illness Pt is a 49 y/o female with known metastatic breast CA and HTN.  Pt with metastatic tumor at T8/T9.  S/P T6-T11 PLA with morselized allograft and segmental pedical screw fixation.    PT Comments    Pt performed gt training with improved confidence and quality.  She continues to present with B weakness R>L.  Pt with improved foot clearance this session but continues to hip hike on R side.  Pt continues to benefit from skilled rehab in a post acute setting to improved strength and function before return home.     Follow Up Recommendations  CIR;Supervision/Assistance - 24 hour     Equipment Recommendations  Rolling walker with 5" wheels;3in1 (PT)    Recommendations for Other Services       Precautions / Restrictions Precautions Precautions: Back Precaution Booklet Issued: No Precaution Comments: Reviewed back precautions.  Required Braces or Orthoses: Other Brace Other Brace: No brace needed per MD order Restrictions Weight Bearing Restrictions: No    Mobility  Bed Mobility               General bed mobility comments: Pt seated edge of bed on arrival.  Transfers Overall transfer level: Needs assistance Equipment used: Rolling walker (2 wheeled) Transfers: Sit to/from Stand Sit to Stand: Min assist;From elevated surface         General transfer comment: Min for transfers and mobility, requires increased time cues for hand placement to avoid tipping the RW.  Ambulation/Gait Ambulation/Gait assistance: Min assist;Mod assist Gait Distance (Feet): 70 Feet Assistive device: Rolling walker (2 wheeled) Gait Pattern/deviations: Step-through pattern;Wide base of support;Ataxic;Trunk flexed;Shuffle;Scissoring     General Gait Details: weak and uncoordinated steps with B weakness.  Close chair follow for  safety.   Stairs             Wheelchair Mobility    Modified Rankin (Stroke Patients Only)       Balance Overall balance assessment: Needs assistance Sitting-balance support: No upper extremity supported;Bilateral upper extremity supported Sitting balance-Leahy Scale: Fair       Standing balance-Leahy Scale: Poor                              Cognition Arousal/Alertness: Awake/alert Behavior During Therapy: WFL for tasks assessed/performed Overall Cognitive Status: Within Functional Limits for tasks assessed                                        Exercises      General Comments        Pertinent Vitals/Pain Pain Assessment: 0-10 Pain Score: 7  Pain Location: back incision Pain Descriptors / Indicators: Discomfort;Grimacing;Sore Pain Intervention(s): Monitored during session;Repositioned    Home Living                      Prior Function            PT Goals (current goals can now be found in the care plan section) Acute Rehab PT Goals Patient Stated Goal: Be independent again Potential to Achieve Goals: Good Progress towards PT goals: Progressing toward goals    Frequency    Min 5X/week      PT Plan Current plan remains appropriate  Co-evaluation              AM-PAC PT "6 Clicks" Mobility   Outcome Measure  Help needed turning from your back to your side while in a flat bed without using bedrails?: A Lot Help needed moving from lying on your back to sitting on the side of a flat bed without using bedrails?: A Little Help needed moving to and from a bed to a chair (including a wheelchair)?: A Little Help needed standing up from a chair using your arms (e.g., wheelchair or bedside chair)?: A Little Help needed to walk in hospital room?: A Little Help needed climbing 3-5 steps with a railing? : Total 6 Click Score: 15    End of Session Equipment Utilized During Treatment: Gait belt Activity  Tolerance: Patient tolerated treatment well;Patient limited by pain Patient left: in chair;with call bell/phone within reach;with chair alarm set Nurse Communication: Mobility status;Precautions PT Visit Diagnosis: Other abnormalities of gait and mobility (R26.89);Pain;Unsteadiness on feet (R26.81);Muscle weakness (generalized) (M62.81)     Time: [id number] PT Time Calculation (min) (ACUTE ONLY): 16 min  Charges:  $Gait Training: 8-22 mins                     Erasmo Leventhal , PTA Acute Rehabilitation Services Pager [phone] Office [phone]     [place] Tursi Eli Hose 01/07/2020, 5:48 PM

## 2020-01-07 NOTE — PMR Pre-admission (Signed)
PMR Admission Coordinator Pre-Admission Assessment  Patient: Misty Arnold is an 49 y.o., female MRN: [id number] DOB: 09-Mar-1971 Height: 5' 5"  (165.1 cm) Weight: 49.9 kg  Insurance Information HMO:     PPO: yes     PCP:      IPA:      80/20:      OTHER:  PRIMARY: [employer]      Policy#: [id number]      Subscriber: Patient CM Name: Lattie Haw      Phone#: [phone] [id number]     Fax#: [phone] Pre-Cert#: [id number]      Employer:  Josem Kaufmann provided by Lattie Haw for admit to CIR 3/5. Pt is approved with start date 3/5. Clinical updates are due weekly (by 3/12) to [employer] (f): [phone] (p); [phone] Benefits:  Phone #: online     Name: [email or web address], transaction # [id number] Eff. Date: 11/06/19-11/04/20     Deduct: does not have one      Out of Pocket Max: $5,900 ($87.89 met)      Life Max:  CIR: $355/day co-pay for days 1-4, $0/day co-pay for days 5-90      SNF: $0/day co-pay for days 1-20, $184/day co-pay for days 21-100; limited to 100 days/cal yr Outpatient: $10-$40/visit co-pay pending service provider; limited by medical necessity     Home Health: 100% coverage, 0% co-insurance; limited by medical necessity     DME: 80% coverage     Co-Pay: 20% co-insurance Providers:  SECONDARY: None      Policy#:       Subscriber:  CM Name:       Phone#:      Fax#:  Pre-Cert#:       Employer:  Benefits:  Phone #:      Name:  Eff. Date:      Deduct:       Out of Pocket Max:       Life Max:  CIR:       SNF:  Outpatient:      Co-Pay:  Home Health:       Co-Pay:  DME:      Co-Pay:   Medicaid Application Date:       Case Manager:  Disability Application Date:       Case Worker:   The "Data Collection Information Summary" for patients in Inpatient Rehabilitation Facilities with attached "Privacy Act [place] Records" was provided and verbally reviewed with: Patient  Emergency Contact Information Contact Information    Name Relation Home Work Mobile   [place]  Significant other [place] Mother   [phone]      Current Medical History  Patient Admitting Diagnosis: 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 Pt presented to [facility] on 3/1 as a transfer from [facility]. She had originally presented to [facility] due to complaints of numbness and tingling that started in her feet and has progressed up to her back over the past 3 days. Pt reports decreased sensation and weakness that progressed from her right leg to her left leg and had experienced several falls. While pt denies loss of bowel and bladder function she does endorse saddle anesthesia. 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 Neurosurgery recommended T8 and T9 decompressive laminectomy with tumor  resection with need for T7-T10 pedical screw fixation. Pt undwernt surgery by Dr. Annette Stable on 3/1. Pt was evaluated by therapies with recommendation for CIR. Per radiation oncology, pt will need postop radiation therapy to begin 2 to 3 weeks after surgery and anticipate needing 3 weeks of radiation therapy on a daily basis.   Complete NIHSS TOTAL: 0  Patient's medical record from [facility] has been reviewed by the rehabilitation admission coordinator and physician.  Past Medical History  Past Medical History:  Diagnosis Date  . Abnormal uterine bleeding   . Anemia   . Breast cancer ([place])   . Depression   . Fibroid   . Hormone disorder   . Hypertension   . Infertility, female     Family History   family history includes Breast cancer in her maternal aunt; CAD in her paternal grandmother; Cancer in her maternal aunt, maternal grandfather, and paternal grandfather; Colon cancer in her maternal grandfather and  paternal grandfather; Diabetes in her father, mother, and paternal grandmother; Heart attack in her maternal aunt, mother, and paternal grandmother; Heart disease in her mother; Heart failure in her paternal grandmother; Hyperlipidemia in her mother and paternal grandmother; Stroke in her father, maternal aunt, maternal grandfather, maternal grandmother, mother, and paternal grandmother.  Prior Rehab/Hospitalizations Has the patient had prior rehab or hospitalizations prior to admission? No  Has the patient had major surgery during 100 days prior to admission? Yes   Current Medications  Current Facility-Administered Medications:  .  0.9 %  sodium chloride infusion (Manually program via Guardrails IV Fluids), , Intravenous, Once, Pool, Mallie Mussel, MD .  0.9 %  sodium chloride infusion, 250 mL, Intravenous, Continuous, Pool, Henry, MD .  0.9 % NaCl with KCl 20 mEq/ L  infusion, , Intravenous, Continuous, Pool, Mallie Mussel, MD, Stopped at 01/05/20 1219 .  acetaminophen (TYLENOL) tablet 650 mg, 650 mg, Oral, Q4H PRN **OR** acetaminophen (TYLENOL) suppository 650 mg, 650 mg, Rectal, Q4H PRN, [name], MD .  amLODipine (NORVASC) tablet 10 mg, 10 mg, Oral, Daily, Pool, Mallie Mussel, MD, 10 mg at 01/08/20 1014 .  ascorbic acid (VITAMIN C) tablet 250 mg, 250 mg, Oral, Daily, Bergman, Meghan D, NP, 250 mg at 01/08/20 1014 .  bisacodyl (DULCOLAX) suppository 10 mg, 10 mg, Rectal, Daily PRN, [name], MD, 10 mg at 01/07/20 1141 .  calcium-vitamin D (OSCAL WITH D) 500-200 MG-UNIT per tablet 1 tablet, 1 tablet, Oral, BID WC, [name], MD, 1 tablet at 01/08/20 0800 .  Chlorhexidine Gluconate Cloth 2 % PADS 6 each, 6 each, Topical, Daily, [name], MD, 6 each at 01/08/20 1018 .  cholecalciferol (VITAMIN D3) tablet 5,000 Units, 5,000 Units, Oral, BID, [name], MD, 5,000 Units at 01/08/20 1012 .  dexamethasone (DECADRON) injection 4 mg, 4 mg, Intravenous, Q6H, 4 mg at 01/08/20 0513 **OR** dexamethasone (DECADRON)  tablet 4 mg, 4 mg, Oral, Q6H, Pool, Mallie Mussel, MD, 4 mg at 01/08/20 1239 .  diazepam (VALIUM) tablet 5-10 mg, 5-10 mg, Oral, Q6H PRN, [name], MD .  HYDROcodone-acetaminophen (NORCO) 10-325 MG per tablet 1 tablet, 1 tablet, Oral, Q4H PRN, [name], MD, 1 tablet at 01/07/20 1536 .  HYDROmorphone (DILAUDID) injection 1 mg, 1 mg, Intravenous, Q2H PRN, [name], MD, 1 mg at 01/08/20 1136 .  menthol-cetylpyridinium (CEPACOL) lozenge 3 mg, 1 lozenge, Oral, PRN **OR** phenol (CHLORASEPTIC) mouth spray 1 spray, 1 spray, Mouth/Throat, PRN, Pool, Henry, MD .  ondansetron (ZOFRAN) tablet 4 mg, 4 mg, Oral, Q6H PRN **OR**  ondansetron (ZOFRAN) injection 4 mg, 4 mg, Intravenous, Q6H PRN, [name], MD .  oxyCODONE (Oxy IR/ROXICODONE) immediate release tablet 10 mg, 10 mg, Oral, Q3H PRN, [name], MD, 10 mg at 01/07/20 0403 .  polyethylene glycol (MIRALAX / GLYCOLAX) packet 17 g, 17 g, Oral, Daily PRN, [name], MD, 17 g at 01/07/20 1140 .  sodium chloride flush (NS) 0.9 % injection 3 mL, 3 mL, Intravenous, Q12H, Pool, Mallie Mussel, MD, 3 mL at 01/08/20 1017 .  sodium chloride flush (NS) 0.9 % injection 3 mL, 3 mL, Intravenous, PRN, [name], MD .  sodium phosphate (FLEET) 7-19 GM/118ML enema 1 enema, 1 enema, Rectal, Once PRN, [name], MD .  sodium phosphate (FLEET) 7-19 GM/118ML enema 1 enema, 1 enema, Rectal, Daily PRN, Reinaldo Meeker, Meghan D, NP  Facility-Administered Medications Ordered in Other Encounters:  .  goserelin (ZOLADEX) injection 3.6 mg, 3.6 mg, Subcutaneous, Once, Nicholas Lose, MD  Patients Current Diet:  Diet Order            Diet regular Room service appropriate? Yes; Fluid consistency: Thin  Diet effective now              Precautions / Restrictions Precautions Precautions: Back Precaution Booklet Issued: No Precaution Comments: Reviewed back precautions.  Other Brace: No brace needed per MD order Restrictions Weight Bearing Restrictions: No   Has the patient had 2 or  more falls or a fall with injury in the past year? Yes  Prior Activity Level Community (5-7x/wk): on disability but was still driving, independent, and community dwelling  Prior Functional Level Self Care: Did the patient need help bathing, dressing, using the toilet or eating? Independent  Indoor Mobility: Did the patient need assistance with walking from room to room (with or without device)? Independent  Stairs: Did the patient need assistance with internal or external stairs (with or without device)? Independent  Functional Cognition: Did the patient need help planning regular tasks such as shopping or remembering to take medications? Independent  Home Assistive Devices / Equipment Home Assistive Devices/Equipment: None Home Equipment: None  Prior Device Use: Indicate devices/aids used by the patient prior to current illness, exacerbation or injury? as weakness progressed, pt began using Cane  Current Functional Level Cognition  Overall Cognitive Status: Within Functional Limits for tasks assessed Orientation Level: Oriented X4    Extremity Assessment (includes Sensation/Coordination)  Upper Extremity Assessment: Generalized weakness  Lower Extremity Assessment: Defer to PT evaluation RLE Deficits / Details: gross movement only, numbness, decreased proprioception, RLE Coordination: decreased fine motor, decreased gross motor LLE Deficits / Details: generalized weakness, movement uncoordinated, decreased proprioception.    ADLs  Overall ADL's : Needs assistance/impaired Eating/Feeding: Set up, Sitting Grooming: Wash/dry face, Sitting, Supervision/safety, Set up, Oral care, Min guard Grooming Details (indicate cue type and reason): Completed oral care in standing at sink, washing face in sitting Upper Body Bathing: Minimal assistance, Sitting Lower Body Bathing: Maximal assistance, Sit to/from stand Upper Body Dressing : Minimal assistance, Sitting Lower Body Dressing:  Maximal assistance, Sit to/from stand Toilet Transfer: Min guard, Cueing for safety Toilet Transfer Details (indicate cue type and reason): Simlated with sit<>stands from bed and chair Functional mobility during ADLs: Minimal assistance, Min guard General ADL Comments: Pt continues to have decreased balance but improving greatly    Mobility  Overal bed mobility: Needs Assistance Bed Mobility: Rolling, Sidelying to Sit Rolling: Mod assist Sidelying to sit: Mod assist General bed mobility comments: Pt seated edge of bed on arrival.  Transfers  Overall transfer level: Needs assistance Equipment used: Rolling walker (2 wheeled) Transfers: Sit to/from Stand Sit to Stand: Min assist, From elevated surface Stand pivot transfers: Min assist, Mod assist General transfer comment: Min for transfers and mobility, requires increased time cues for hand placement to avoid tipping the RW.    Ambulation / Gait / Stairs / Wheelchair Mobility  Ambulation/Gait Ambulation/Gait assistance: Min assist, Mod assist Gait Distance (Feet): 70 Feet Assistive device: Rolling walker (2 wheeled) Gait Pattern/deviations: Step-through pattern, Wide base of support, Ataxic, Trunk flexed, Shuffle, Scissoring General Gait Details: weak and uncoordinated steps with B weakness.  Close chair follow for safety.    Posture / Balance Dynamic Sitting Balance Sitting balance - Comments: more confident with bil/single UE's due to numbness in buttocks. Balance Overall balance assessment: Needs assistance Sitting-balance support: No upper extremity supported, Bilateral upper extremity supported Sitting balance-Leahy Scale: Fair Sitting balance - Comments: more confident with bil/single UE's due to numbness in buttocks. Standing balance support: During functional activity, Single extremity supported Standing balance-Leahy Scale: Poor Standing balance comment: braced on sink when completing functional tasks    Special  needs/care consideration BiPAP/CPAP : no CPM : no Continuous Drip IV : 0.9% sodium chloride infusion, 0.9% NaCl with KCL 20 m/Eq/L infusion  Dialysis : no        Days : no Life Vest : no Oxygen: no, on RA Special Bed : no Trach Size : no Wound Vac (area) : no      Location : no Skin: surgical incision to back, wound to right breast, closed system drain (hemovac) to superior, medial, back.                           Bowel mgmt: no value recorded at this time.  Bladder mgmt: continent Diabetic mgmt: no Behavioral consideration : no Chemo/radiation : Per radiation oncology, pt will be set up for postop radiation therapy to begin 2-3 weeks after surgery depending on rec's from neurosurgery and postop recovery. Pt may need daily radiation therapy for an anticipated 3 weeks.    Previous Home Environment (from acute therapy documentation) Living Arrangements: Other relatives(Mom when out of rehab) Available Help at Discharge: Family, Available 24 hours/day(mom can't lift) Type of Home: House Home Layout: Two level, Bed/bath upstairs Alternate Level Stairs-Number of Steps: 1 flight Home Access: Stairs to enter Entrance Stairs-Rails: None Entrance Stairs-Number of Steps: 2 Bathroom Shower/Tub: Multimedia programmer: [place]: No  Discharge Living Setting Plans for Discharge Living Setting: Other (Comment)(pt working on accessible housing (ramped entrance)) Type of Home at Discharge: House Discharge Home Layout: pt reports it will be one level Discharge Home Access: pt reports it will be a ramped entrance Discharge Bathroom Shower/Tub: unknown at this time. Discharge Bathroom Toilet: unknown Discharge Bathroom Accessibility: unknown, but pt is working towards an accessible house Does the patient have any problems obtaining your medications?: No  Social/Family/Support Systems Patient Roles: Other (Comment)(will have family and friend support at [place]. ) Contact  Information: mother: [phone]; Eula Flax [phone]) Anticipated Caregiver: mother + friends Eula Flax) Anticipated Caregiver's Contact Information: see above Ability/Limitations of Caregiver: 24/7 supervision (pt still working out details of plan) but spoke to mom who is willing to help as she can. Caregiver Availability: 24/7 Discharge Plan Discussed with Primary Caregiver: yes with her mom Is Caregiver In Agreement with Plan?: Yes Does Caregiver/Family have Issues with Lodging/Transportation while Pt is in Rehab?:  No  Goals/Additional Needs Patient/Family Goal for Rehab: PT/OT: Supervision; SLP: NA Expected length of stay: 10-14 days Cultural Considerations: NA Dietary Needs: regular diet with thin liquids Equipment Needs: TBD Special Service Needs: may need radiation/chemo while in IP Rehab Pt/Family Agrees to Admission and willing to participate: Yes Program Orientation Provided & Reviewed with Pt/Caregiver Including Roles  & Responsibilities: Yes(pt)  Barriers to Discharge: Pending chemo/radiation, Other (comments)(pt working on details of 24/7 A w/ family/friends today)  Decrease burden of Care through IP rehab admission: NA  Possible need for SNF placement upon discharge: Not anticipated. Pt is aware she will need 24/7 supervision at DC and is prepared to have that set up at DC. Anticipate pt will make great gains while in CIR and can progress to a supervision level.   Patient Condition: I have reviewed medical records from [facility], spoken with RN, NP, and patient. I met with patient at the bedside for inpatient rehabilitation assessment.  Patient will benefit from ongoing PT and OT, can actively participate in 3 hours of therapy a day 5 days of the week, and can make measurable gains during the admission.  Patient will also benefit from the coordinated team approach during an Inpatient Acute Rehabilitation admission.  The patient will receive  intensive therapy as well as Rehabilitation physician, nursing, social worker, and care management interventions.  Due to safety, skin/wound care, disease management, medication administration, pain management and patient education the patient requires 24 hour a day rehabilitation nursing.  The patient is currently Mod A with mobility and basic ADLs.  Discharge setting and therapy post discharge at home with home health is anticipated.  Patient has agreed to participate in the Acute Inpatient Rehabilitation Program and will admit 01/08/20.  Preadmission Screen Completed By:  Raechel Ache, 01/08/2020 1:38 PM ________________   Discussed status with Dr. Posey Pronto on 01/08/20 at 1:33PM and received approval for admission today.  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 Does the need for close, 24 hr/day Medical supervision in concert with the patient's rehab needs make it unreasonable for this patient to be served in a less intensive setting? Yes 2. Co-Morbidities requiring supervision/potential complications:  metastatic breast cancer, HTN (monitor and provide prns in accordance with increased physical exertion and pain), depression (ensure mood does not hinder progress of therapies), 3. Due to bladder management, bowel management, safety, skin/wound care, disease management, pain management and patient education, does the patient require 24 hr/day rehab nursing? Yes 4. Does the patient require coordinated care of a physician, rehab nurse, PT, OT to address physical and functional deficits in the context of the above medical diagnosis(es)? Yes Addressing deficits in the following areas: balance, endurance, locomotion, strength, transferring, bathing, dressing, toileting and psychosocial support 5. Can the patient actively participate in an intensive therapy program of at least 3 hrs of  therapy 5 days a week? Yes 6. The potential for patient to make measurable gains while on inpatient rehab is excellent 7. Anticipated functional outcomes upon discharge from inpatient rehab: supervision and min assist PT, supervision OT, n/a SLP 8. Estimated rehab length of stay to reach the above functional goals is: 12-16 days. 9. Anticipated discharge destination: Home 10. Overall Rehab/Functional Prognosis: good and fair   MD Signature: Delice Lesch, MD, ABPMR

## 2020-01-07 NOTE — Progress Notes (Signed)
Inpatient Rehabilitation-Admissions Coordinator   Per pt she is having a family meeting around noon to ensure a safe DC plan. AC will go ahead and begin insurance authorization process for possible admit.   Raechel Ache, OTR/L  Rehab Admissions Coordinator  [phone] 01/07/2020 11:04 AM

## 2020-01-08 ENCOUNTER — Inpatient Hospital Stay (HOSPITAL_COMMUNITY)
Admission: RE | Admit: 2020-01-08 | Discharge: 2020-01-20 | DRG: 560 | Disposition: A | Discharge status: Home Health | Payer: Medicare PPO | Source: Intra-hospital | Attending: Physical Medicine and Rehabilitation | Admitting: Physical Medicine and Rehabilitation

## 2020-01-08 ENCOUNTER — Other Ambulatory Visit: Payer: Self-pay

## 2020-01-08 DIAGNOSIS — Z170 Estrogen receptor positive status [ER+]: Secondary | ICD-10-CM

## 2020-01-08 DIAGNOSIS — Z9889 Other specified postprocedural states: Secondary | ICD-10-CM

## 2020-01-08 DIAGNOSIS — C50411 Malignant neoplasm of upper-outer quadrant of right female breast: Secondary | ICD-10-CM | POA: Diagnosis present

## 2020-01-08 DIAGNOSIS — Z8249 Family history of ischemic heart disease and other diseases of the circulatory system: Secondary | ICD-10-CM

## 2020-01-08 DIAGNOSIS — C7951 Secondary malignant neoplasm of bone: Secondary | ICD-10-CM | POA: Diagnosis present

## 2020-01-08 DIAGNOSIS — N319 Neuromuscular dysfunction of bladder, unspecified: Secondary | ICD-10-CM | POA: Diagnosis present

## 2020-01-08 DIAGNOSIS — D72829 Elevated white blood cell count, unspecified: Secondary | ICD-10-CM | POA: Diagnosis present

## 2020-01-08 DIAGNOSIS — C50919 Malignant neoplasm of unspecified site of unspecified female breast: Secondary | ICD-10-CM

## 2020-01-08 DIAGNOSIS — Z823 Family history of stroke: Secondary | ICD-10-CM

## 2020-01-08 DIAGNOSIS — G959 Disease of spinal cord, unspecified: Secondary | ICD-10-CM | POA: Diagnosis present

## 2020-01-08 DIAGNOSIS — G9589 Other specified diseases of spinal cord: Secondary | ICD-10-CM | POA: Diagnosis present

## 2020-01-08 DIAGNOSIS — Z79811 Long term (current) use of aromatase inhibitors: Secondary | ICD-10-CM | POA: Diagnosis not present

## 2020-01-08 DIAGNOSIS — Z4789 Encounter for other orthopedic aftercare: Secondary | ICD-10-CM | POA: Diagnosis present

## 2020-01-08 DIAGNOSIS — Z833 Family history of diabetes mellitus: Secondary | ICD-10-CM | POA: Diagnosis not present

## 2020-01-08 DIAGNOSIS — Z8349 Family history of other endocrine, nutritional and metabolic diseases: Secondary | ICD-10-CM | POA: Diagnosis not present

## 2020-01-08 DIAGNOSIS — Z800 Family history of malignant neoplasm of digestive organs: Secondary | ICD-10-CM

## 2020-01-08 DIAGNOSIS — R531 Weakness: Secondary | ICD-10-CM | POA: Diagnosis present

## 2020-01-08 DIAGNOSIS — Z9011 Acquired absence of right breast and nipple: Secondary | ICD-10-CM

## 2020-01-08 DIAGNOSIS — K5900 Constipation, unspecified: Secondary | ICD-10-CM | POA: Diagnosis present

## 2020-01-08 DIAGNOSIS — K592 Neurogenic bowel, not elsewhere classified: Secondary | ICD-10-CM

## 2020-01-08 DIAGNOSIS — D62 Acute posthemorrhagic anemia: Secondary | ICD-10-CM | POA: Diagnosis present

## 2020-01-08 DIAGNOSIS — Z803 Family history of malignant neoplasm of breast: Secondary | ICD-10-CM

## 2020-01-08 DIAGNOSIS — C7949 Secondary malignant neoplasm of other parts of nervous system: Secondary | ICD-10-CM | POA: Diagnosis present

## 2020-01-08 DIAGNOSIS — R29898 Other symptoms and signs involving the musculoskeletal system: Secondary | ICD-10-CM

## 2020-01-08 DIAGNOSIS — D72828 Other elevated white blood cell count: Secondary | ICD-10-CM

## 2020-01-08 DIAGNOSIS — M7989 Other specified soft tissue disorders: Secondary | ICD-10-CM | POA: Diagnosis not present

## 2020-01-08 DIAGNOSIS — G8222 Paraplegia, incomplete: Secondary | ICD-10-CM

## 2020-01-08 DIAGNOSIS — G96198 Other disorders of meninges, not elsewhere classified: Secondary | ICD-10-CM

## 2020-01-08 LAB — TYPE AND SCREEN
ABO/RH(D): B POS
Antibody Screen: NEGATIVE
Unit division: 0
Unit division: 0
Unit division: 0
Unit division: 0
Unit division: 0
Unit division: 0

## 2020-01-08 LAB — BPAM RBC
Blood Product Expiration Date: 202103172359
Blood Product Expiration Date: 202103192359
Blood Product Expiration Date: 202103192359
Blood Product Expiration Date: 202103192359
Blood Product Expiration Date: 202103202359
Blood Product Expiration Date: 202103222359
ISSUE DATE / TIME: 202102171840
ISSUE DATE / TIME: 202103011358
ISSUE DATE / TIME: 202103011358
ISSUE DATE / TIME: 202103011456
ISSUE DATE / TIME: 202103011456
Unit Type and Rh: 7300
Unit Type and Rh: 7300
Unit Type and Rh: 7300
Unit Type and Rh: 7300
Unit Type and Rh: 9500
Unit Type and Rh: 9500

## 2020-01-08 MED ORDER — ONDANSETRON HCL 4 MG PO TABS: 4.0000 mg | ORAL_TABLET | Freq: Four times a day (QID) | ORAL | 0 refills | Status: DC | PRN

## 2020-01-08 MED ORDER — AMLODIPINE BESYLATE 10 MG PO TABS
10.0000 mg | ORAL_TABLET | Freq: Every day | ORAL | Status: DC
  Administered 2020-01-09 – 2020-01-20 (×12): 10 mg via ORAL
  Filled 2020-01-08 (×12): qty 1

## 2020-01-08 MED ORDER — FLEET ENEMA 7-19 GM/118ML RE ENEM: 1.0000 | ENEMA | Freq: Every day | RECTAL | Status: DC | PRN

## 2020-01-08 MED ORDER — BISACODYL 10 MG RE SUPP: 10.0000 mg | Freq: Every day | RECTAL | Status: DC | PRN

## 2020-01-08 MED ORDER — CALCIUM CARBONATE-VITAMIN D 500-200 MG-UNIT PO TABS
1.0000 | ORAL_TABLET | Freq: Two times a day (BID) | ORAL | Status: DC
  Administered 2020-01-08 – 2020-01-20 (×24): 1 via ORAL
  Filled 2020-01-08 (×24): qty 1

## 2020-01-08 MED ORDER — POLYETHYLENE GLYCOL 3350 17 G PO PACK
17.0000 g | PACK | Freq: Every day | ORAL | Status: DC | PRN
  Administered 2020-01-09 – 2020-01-20 (×8): 17 g via ORAL
  Filled 2020-01-08 (×8): qty 1

## 2020-01-08 MED ORDER — DEXAMETHASONE 0.5 MG PO TABS: 2.0000 mg | ORAL_TABLET | Freq: Four times a day (QID) | ORAL | 0 refills | Status: DC

## 2020-01-08 MED ORDER — HYDROCODONE-ACETAMINOPHEN 10-325 MG PO TABS: 1.0000 | ORAL_TABLET | ORAL | 0 refills | Status: DC | PRN

## 2020-01-08 MED ORDER — ACETAMINOPHEN 325 MG PO TABS: 650.0000 mg | ORAL_TABLET | ORAL | 0 refills | Status: DC | PRN

## 2020-01-08 MED ORDER — ACETAMINOPHEN 325 MG PO TABS: 650.0000 mg | ORAL_TABLET | ORAL | Status: DC | PRN

## 2020-01-08 MED ORDER — DEXAMETHASONE SODIUM PHOSPHATE 4 MG/ML IJ SOLN
4.0000 mg | Freq: Four times a day (QID) | INTRAMUSCULAR | Status: DC
  Administered 2020-01-08 – 2020-01-09 (×3): 4 mg via INTRAVENOUS
  Filled 2020-01-08 (×4): qty 1

## 2020-01-08 MED ORDER — SORBITOL 70 % SOLN: 30.0000 mL | Freq: Every day | Status: DC | PRN

## 2020-01-08 MED ORDER — DIAZEPAM 5 MG PO TABS: 5.0000 mg | ORAL_TABLET | Freq: Four times a day (QID) | ORAL | 0 refills | Status: DC | PRN

## 2020-01-08 MED ORDER — BISACODYL 10 MG RE SUPP: 10.0000 mg | Freq: Every day | RECTAL | 0 refills | Status: DC | PRN

## 2020-01-08 MED ORDER — ACETAMINOPHEN 650 MG RE SUPP: 650.0000 mg | RECTAL | Status: DC | PRN

## 2020-01-08 MED ORDER — FLEET ENEMA 7-19 GM/118ML RE ENEM: 1.0000 | ENEMA | Freq: Every day | RECTAL | 0 refills | Status: DC | PRN

## 2020-01-08 MED ORDER — DIAZEPAM 2 MG PO TABS
5.0000 mg | ORAL_TABLET | Freq: Four times a day (QID) | ORAL | Status: DC | PRN
  Administered 2020-01-09: 6 mg via ORAL
  Administered 2020-01-09 – 2020-01-18 (×6): 10 mg via ORAL
  Filled 2020-01-08 (×2): qty 5
  Filled 2020-01-08: qty 3
  Filled 2020-01-08 (×4): qty 5

## 2020-01-08 MED ORDER — VITAMIN D 25 MCG (1000 UNIT) PO TABS
5000.0000 [IU] | ORAL_TABLET | Freq: Two times a day (BID) | ORAL | Status: DC
  Administered 2020-01-08 – 2020-01-20 (×24): 5000 [IU] via ORAL
  Filled 2020-01-08 (×24): qty 5

## 2020-01-08 MED ORDER — DEXAMETHASONE 4 MG PO TABS
4.0000 mg | ORAL_TABLET | Freq: Four times a day (QID) | ORAL | Status: DC
  Administered 2020-01-08 – 2020-01-20 (×44): 4 mg via ORAL
  Filled 2020-01-08 (×44): qty 1

## 2020-01-08 MED ORDER — OXYCODONE HCL 5 MG PO TABS
10.0000 mg | ORAL_TABLET | ORAL | Status: DC | PRN
  Administered 2020-01-08 – 2020-01-20 (×44): 10 mg via ORAL
  Filled 2020-01-08 (×44): qty 2

## 2020-01-08 MED ORDER — MENTHOL 3 MG MT LOZG
1.0000 | LOZENGE | OROMUCOSAL | Status: DC | PRN
  Administered 2020-01-08: 3 mg via ORAL
  Filled 2020-01-08: qty 9

## 2020-01-08 MED ORDER — ASCORBIC ACID 500 MG PO TABS
250.0000 mg | ORAL_TABLET | Freq: Every day | ORAL | Status: DC
  Administered 2020-01-09 – 2020-01-20 (×12): 250 mg via ORAL
  Filled 2020-01-08 (×12): qty 1

## 2020-01-08 NOTE — Progress Notes (Signed)
Patient's questions answered regarding routine of rehab. Pt has a positive attitude. She discussed her goals and she expressed determination and a positive attitude. Patient did have a moment of appropriate sadness and tearfulness. Patient was given emotional and spiritual support. Patient was informed that there is a Cha[place]in on site at all times. Patient in bed pain controlled call light in reach and 2 SR up.

## 2020-01-08 NOTE — Progress Notes (Signed)
Inpatient Rehabilitation-Admissions Coordinator   [facility] received insurance approval and medical approval from attending service for admit to CIR today. AC reviewed insurance benefit letter and consent form with pt wishing to proceed. RN and [facility] team aware of plan for admit today.   Raechel Ache, OTR/L  Rehab Admissions Coordinator  [phone] 01/08/2020 5:43 PM

## 2020-01-08 NOTE — Progress Notes (Signed)
Pt admitted to room 4W24 around 1620. C/o of mid back pain, no other complaints. Oriented to floor, call bell and rehab fall policy. Vital signs stable. Continue plan of care.  Gerald Stabs, RN

## 2020-01-08 NOTE — Progress Notes (Signed)
Physical Therapy Treatment Patient Details Name: Misty Arnold MRN: [id number] DOB: September 06, 1971 Today's Date: 01/08/2020    History of Present Illness Pt is a 49 y/o female with known metastatic breast CA and HTN.  Pt with metastatic tumor at T8/T9.  S/P T6-T11 PLA with morselized allograft and segmental pedical screw fixation.    PT Comments    Pt performed gt training and functional mobility this session.  Continues to present with R hip hiking and circumduction.  She progressed gt distance and continues to benefit from skilled rehab at [facility] before return home.     Follow Up Recommendations        Equipment Recommendations       Recommendations for Other Services       Precautions / Restrictions Precautions Precautions: Back Precaution Booklet Issued: No Precaution Comments: Reviewed back precautions.  Required Braces or Orthoses: Other Brace Other Brace: No brace needed per MD order Restrictions Weight Bearing Restrictions: No    Mobility  Bed Mobility Overal bed mobility: Needs Assistance Bed Mobility: Rolling;Sidelying to Sit   Sidelying to sit: Min guard;Min assist       General bed mobility comments: Pt seated in recliner on arrival.  Transfers Overall transfer level: Needs assistance Equipment used: Rolling walker (2 wheeled) Transfers: Sit to/from Stand Sit to Stand: Min assist Stand pivot transfers: Min assist;Mod assist       General transfer comment: Cues for hand placement to and from seated surface.  Pt able to rise into standing but still requires cues for set up .  Ambulation/Gait Ambulation/Gait assistance: Min assist Gait Distance (Feet): 90 Feet Assistive device: Rolling walker (2 wheeled) Gait Pattern/deviations: Step-through pattern;Wide base of support;Ataxic;Trunk flexed;Shuffle;Scissoring     General Gait Details: weak and uncoordinated steps with B weakness.  Close chair follow for safety.   Stairs             Wheelchair  Mobility    Modified Rankin (Stroke Patients Only)       Balance Overall balance assessment: Needs assistance Sitting-balance support: No upper extremity supported;Bilateral upper extremity supported Sitting balance-Leahy Scale: Fair     Standing balance support: During functional activity;Single extremity supported;Bilateral upper extremity supported Standing balance-Leahy Scale: Poor Standing balance comment: braced on sink when completing functional tasks then dependent on external device in ambulation                            Cognition Arousal/Alertness: Awake/alert Behavior During Therapy: WFL for tasks assessed/performed Overall Cognitive Status: Within Functional Limits for tasks assessed                                        Exercises      General Comments        Pertinent Vitals/Pain Pain Assessment: No/denies pain Faces Pain Scale: Hurts little more Pain Location: back incision Pain Descriptors / Indicators: Discomfort;Grimacing;Sore Pain Intervention(s): Monitored during session;Repositioned    Home Living                      Prior Function            PT Goals (current goals can now be found in the care plan section) Acute Rehab PT Goals Patient Stated Goal: Be independent again Potential to Achieve Goals: Good Progress towards PT goals: Progressing toward goals  Frequency           PT Plan Current plan remains appropriate    Co-evaluation              AM-PAC PT "6 Clicks" Mobility   Outcome Measure                   End of Session Equipment Utilized During Treatment: Gait belt Activity Tolerance: Patient tolerated treatment well;Patient limited by pain Patient left: in chair;with call bell/phone within reach;with chair alarm set Nurse Communication: Mobility status;Precautions PT Visit Diagnosis: Other abnormalities of gait and mobility (R26.89);Pain;Unsteadiness on feet  (R26.81);Muscle weakness (generalized) (M62.81)     Time: [id number] PT Time Calculation (min) (ACUTE ONLY): 13 min  Charges:  $Gait Training: 8-22 mins                     Erasmo Leventhal , PTA Acute Rehabilitation Services Pager [phone] Office [phone]     Misty Arnold Eli Hose 01/08/2020, 4:30 PM

## 2020-01-08 NOTE — TOC Transition Note (Signed)
Transition of Care [facility]) - CM/SW Discharge Note   Patient Details  Name: Misty Arnold MRN: [id number] Date of Birth: 13-Jul-1971  Transition of Care [facility]) CM/SW Contact:  Pollie Friar, RN Phone Number: 01/08/2020, 3:17 PM   Clinical Narrative:    Pt discharging to CIR today. CM signing off.    Final next level of care: IP Rehab Facility Barriers to Discharge: No Barriers Identified   Patient Goals and CMS Choice        Discharge Placement                       Discharge Plan and Services                                     Social Determinants of Health (SDOH) Interventions     Readmission Risk Interventions No flowsheet data found.

## 2020-01-08 NOTE — Progress Notes (Addendum)
   Providing Compassionate, Quality Care - Together   Subjective: Patient reports continued improvement in mobility. She has been having issues with constipation since prior to admission. She has tried Miralax, prune juice, and glycerin suppositories.  Objective: Vital signs in last 24 hours: Temp:  [97.9 F (36.6 C)-98.9 F (37.2 C)] 97.9 F (36.6 C) (03/05 0827) Pulse Rate:  [69-106] 79 (03/05 0828) Resp:  [14-18] 18 (03/05 0827) BP: (115-127)/(70-81) 122/74 (03/05 0827) SpO2:  [98 %-100 %] 99 % (03/05 0828)  Intake/Output from previous day: 03/04 0701 - 03/05 0700 In: 58 [P.O.:420] Out: 1030 [Urine:950; Drains:80] Intake/Output this shift: Total I/O In: 150 [P.O.:150] Out: -   Alert and oriented x 4 PERRLA CN II-XII grossly intact MAE, Strength improving BLE, now with stronger plantar flexion in both feet, Still decreased light touch sensation in BLE Abdomen distended Incision is covered with Honeycomb dressing and Steri Strips; Dressing is clean, dry, and intact   Lab Results: Recent Labs    01/06/20 1135  WBC 16.2*  HGB 9.2*  HCT 26.9*  PLT 191   BMET Recent Labs    01/06/20 1135  NA 133*  K 4.4  CL 97*  CO2 26  GLUCOSE 197*  BUN 13  CREATININE 0.67  CALCIUM 8.7*    Studies/Results: No results found.  Assessment/Plan: Patient is four day(s) status post T7-T8 T9-T10 decompressive laminectomy with resection of tumor including right T9 transpedicular decompression with resection of tumor with T6-T7-T8 T9-T10-T11 posterior lateral arthrodesis by Dr. Annette Stable. Patient's surgery was complicated by blood loss and she was admitted to ICU for close monitoring post-operatively. She stabilized and was transferred out of the ICU on 01/05/2020. She has been working with therapies who are still recommending CIR.    LOS: 4 days   -Fleet enema ordered -Will discharge to CIR when bed available   Viona Gilmore, DNP, AGNP-C Nurse Practitioner  [facility] [place]. [address] [place], [place], [place] [address] P: [phone]    F: [phone]  01/08/2020, 10:32 AM

## 2020-01-08 NOTE — H&P (Signed)
Physical Medicine and Rehabilitation Admission H&P    Chief Complaint  Patient presents with  . Numbness  . Allergic Reaction  : HPI: Misty Arnold is a 49 year old right-handed female with history of metastatic breast cancer diagnosed November 2011 with mastectomy 08/29/2015 followed by reconstruction and followed by Dr. Lindi Adie and had been maintained on Zoladex and letrozole and later changed to [facility].  History taken from chart review and patient.  Patient had previously been recommended radiation therapy for palliation but she initially declined.  She presented on 01/04/2020 with increasing back pain as well as lower extremity weakness.  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  She underwent T7-8-9-T10 decompressive laminectomy with resection of tumor including right T9 transpedicular decompression with resection of tumor/segmental pedicle screw fixation on 01/04/2020 per Dr. Annette Stable.  No back brace required.  Pathology report consistent with metastatic poorly differentiated carcinoma.  Follow-up radiation oncology Dr. Dwana Curd and plan is for radiation therapy to begin in 2 to 3 weeks after recovery from recent back surgery.  Hospital course further complicated by acute blood loss anemia and leukocytosis.  Hemoglobin 9.2.  Leukocytosis 16,000 felt to be induced by Decadron protocol.  Patient is maintained on a regular diet.  Therapy evaluations completed and patient was admitted for a comprehensive rehab program.  Please see preadmission assessment from earlier today as well.  Review of Systems  Constitutional: Positive for malaise/fatigue and weight loss. Negative for chills and fever.  HENT: Negative for hearing loss.   Eyes: Negative for blurred vision and double vision.  Respiratory: Negative for cough.   Cardiovascular: Negative for chest pain and palpitations.  Gastrointestinal: Positive for constipation. Negative for heartburn,  nausea and vomiting.  Genitourinary: Positive for urgency. Negative for dysuria, flank pain and hematuria.  Musculoskeletal: Positive for back pain, joint pain and myalgias.  Skin: Negative for rash.  Neurological: Positive for sensory change, focal weakness and weakness.  Psychiatric/Behavioral: Positive for depression.       Insomnia   Past Medical History:  Diagnosis Date  . Abnormal uterine bleeding   . Anemia   . Breast cancer ([place])   . Depression   . Fibroid   . Hormone disorder   . Hypertension   . Infertility, female    Past Surgical History:  Procedure Laterality Date  . ABDOMINAL HYSTERECTOMY    . BREAST SURGERY    . LAMINECTOMY N/A 01/04/2020   Procedure: Thoracic Eight, Thoracic Nine THORACIC LAMINECTOMY FOR TUMOR with Thoracic Seven-Thoracic Ten instrumentation;  Surgeon: [name], MD;  Location: [facility];  Service: Neurosurgery;  Laterality: N/A;  Thoracic Eight, Thoracic Nine THORACIC LAMINECTOMY FOR TUMOR with Thoracic Seven-Thoracic Ten instrumentation  . MASTECTOMY    . salivary gland stone     Family History  Problem Relation Age of Onset  . Cancer Maternal Aunt        breast  . Stroke Maternal Aunt   . Heart attack Maternal Aunt   . Cancer Maternal Grandfather   . Colon cancer Maternal Grandfather   . Stroke Maternal Grandfather   . Cancer Paternal Grandfather   . Colon cancer Paternal Grandfather   . Stroke Mother   . Heart attack Mother   . Diabetes Mother   . Hyperlipidemia Mother   . Heart disease Mother   . Diabetes Father   . Stroke Father   . Stroke Paternal Grandmother   . Heart attack Paternal Grandmother   . Heart failure Paternal  Grandmother   . Diabetes Paternal Grandmother   . Hyperlipidemia Paternal Grandmother   . CAD Paternal Grandmother   . Stroke Maternal Grandmother   . Breast cancer Maternal Aunt    Social History:  reports that she has never smoked. She has never used smokeless tobacco. She reports that she does not drink  alcohol or use drugs. Allergies:  Allergies  Allergen Reactions  . Amoxicillin     Other reaction(s): rash/itching  . Penicillins Rash    Did it involve swelling of the face/tongue/throat, SOB, or low BP? no Did it involve sudden or severe rash/hives, skin peeling, or any reaction on the inside of your mouth or nose? Yes Did you need to seek medical attention at a hospital or doctor's office? No When did it last happen?early 20's If all above answers are "NO", may proceed with cephalosporin use.  . Tamoxifen Hypertension   Medications Prior to Admission  Medication Sig Dispense Refill  . amLODipine (NORVASC) 10 MG tablet Take 10 mg by mouth daily.     . Ascorbic Acid (VITAMIN C PO) Take by mouth.    Marland Kitchen Bioflavonoid Products (ESTER C PO) Take 1 tablet by mouth with breakfast, with lunch, and with evening meal.     . BLACK COHOSH EXTRACT PO Take 1 tablet by mouth in the morning, at noon, and at bedtime.    Marland Kitchen BLACK CURRANT SEED OIL PO Take by mouth.    . calcium carbonate (OS-CAL - DOSED IN MG OF ELEMENTAL CALCIUM) 1250 (500 Ca) MG tablet Take 1 tablet by mouth 3 (three) times daily with meals.    . Cholecalciferol (VITAMIN D PO) Take 5,000 Units by mouth 2 (two) times daily.     . Fish Oil OIL 5 mLs by Does not apply route in the morning, at noon, and at bedtime.     . Flaxseed Oil OIL Take 5 mLs by mouth in the morning, at noon, and at bedtime.     Marland Kitchen goserelin (ZOLADEX) 3.6 MG injection Inject 3.6 mg into the skin every 28 (twenty-eight) days.    Marland Kitchen letrozole (FEMARA) 2.5 MG tablet Take 1 tablet (2.5 mg total) by mouth daily. 90 tablet 1  . lidocaine-prilocaine (EMLA) cream Apply 1 application topically as needed. Apply 1 hr prior to zoladex injection. 30 g 0  . MILK THISTLE PO Take 1 capsule by mouth in the morning, at noon, and at bedtime.    . calcium-vitamin D (OSCAL WITH D) 500-200 MG-UNIT tablet Take 1 tablet by mouth 2 (two) times daily with a meal. (Patient not taking: Reported  on 01/04/2020) 180 tablet 1  . traMADol (ULTRAM) 50 MG tablet Take 1 tablet (50 mg total) by mouth every 6 (six) hours as needed. (Patient not taking: Reported on 01/04/2020) 60 tablet 0    Drug Regimen Review Drug regimen was reviewed and remains appropriate with no significant issues identified  Home: Home Living Family/patient expects to be discharged to:: Private residence Living Arrangements: Other relatives(Mom when out of rehab) Available Help at Discharge: Family, Available 24 hours/day(mom can't lift) Type of Home: House Home Access: Stairs to enter [employer] of Steps: 2 Entrance Stairs-Rails: None Home Layout: Two level, Bed/bath upstairs Alternate Level Stairs-Number of Steps: 1 flight Bathroom Shower/Tub: Multimedia programmer: Standard Home Equipment: None   Functional History: Prior Function Level of Independence: Independent(up until past Thursday)  Functional Status:  Mobility: Bed Mobility Overal bed mobility: Needs Assistance Bed Mobility: Rolling, Sidelying to Sit  Rolling: Mod assist Sidelying to sit: Mod assist General bed mobility comments: Pt seated edge of bed on arrival. Transfers Overall transfer level: Needs assistance Equipment used: Rolling walker (2 wheeled) Transfers: Sit to/from Stand Sit to Stand: Min assist, From elevated surface Stand pivot transfers: Min assist, Mod assist General transfer comment: Min for transfers and mobility, requires increased time cues for hand placement to avoid tipping the RW. Ambulation/Gait Ambulation/Gait assistance: Min assist, Mod assist Gait Distance (Feet): 70 Feet Assistive device: Rolling walker (2 wheeled) Gait Pattern/deviations: Step-through pattern, Wide base of support, Ataxic, Trunk flexed, Shuffle, Scissoring General Gait Details: weak and uncoordinated steps with B weakness.  Close chair follow for safety.    ADL: ADL Overall ADL's : Needs  assistance/impaired Eating/Feeding: Set up, Sitting Grooming: Wash/dry face, Sitting, Supervision/safety, Set up, Oral care, Min guard Grooming Details (indicate cue type and reason): Completed oral care in standing at sink, washing face in sitting Upper Body Bathing: Minimal assistance, Sitting Lower Body Bathing: Maximal assistance, Sit to/from stand Upper Body Dressing : Minimal assistance, Sitting Lower Body Dressing: Maximal assistance, Sit to/from stand Toilet Transfer: Min guard, Cueing for safety Toilet Transfer Details (indicate cue type and reason): Simlated with sit<>stands from bed and chair Functional mobility during ADLs: Minimal assistance, Min guard General ADL Comments: Pt continues to have decreased balance but improving greatly  Cognition: Cognition Overall Cognitive Status: Within Functional Limits for tasks assessed Orientation Level: Oriented X4 Cognition Arousal/Alertness: Awake/alert Behavior During Therapy: WFL for tasks assessed/performed Overall Cognitive Status: Within Functional Limits for tasks assessed  Physical Exam: Blood pressure 127/81, pulse 69, temperature 98.4 F (36.9 C), temperature source Oral, resp. rate 16, height 5\' 5"  (1.651 m), weight 49.9 kg, SpO2 100 %. Physical Exam  Vitals reviewed. Constitutional: She appears well-developed.  Frail  HENT:  Head: Normocephalic and atraumatic.  Eyes: EOM are normal. Right eye exhibits no discharge. Left eye exhibits no discharge.  Neck: No tracheal deviation present. No thyromegaly present.  Respiratory: Effort normal. No respiratory distress.  GI: She exhibits no distension.  Musculoskeletal:     Comments: No edema or tenderness in extremities  Neurological: She is alert.  Follows commands oriented x3. Motor: Bilateral upper extremities: 4-4+/5 proximal distal Right lower extremity: Flexion, extension 4 medicine/5, ankle dorsiflexion 2/5 Left lower extremity: Flexion, knee extension 4+/5,  ankle dorsiflexion 4/5 Sensation diminished to light touch right foot  Skin:  Dressing C/D/I  Psychiatric: She has a normal mood and affect. Her behavior is normal. Thought content normal.    Results for orders placed or performed during the hospital encounter of 01/04/20 (from the past 48 hour(s))  CBC     Status: Abnormal   Collection Time: 01/06/20 11:35 AM  Result Value Ref Range   WBC 16.2 (H) 4.0 - 10.5 K/uL   RBC 3.05 (L) 3.87 - 5.11 MIL/uL   Hemoglobin 9.2 (L) 12.0 - 15.0 g/dL   HCT 26.9 (L) 36.0 - 46.0 %   MCV 88.2 80.0 - 100.0 fL   MCH 30.2 26.0 - 34.0 pg   MCHC 34.2 30.0 - 36.0 g/dL   RDW 13.2 11.5 - 15.5 %   Platelets 191 150 - 400 K/uL   nRBC 0.0 0.0 - 0.2 %    Comment: Performed at [facility], [place] [address], [place], [place] [id number]  Basic metabolic panel     Status: Abnormal   Collection Time: 01/06/20 11:35 AM  Result Value Ref Range   Sodium 133 (L) 135 -  145 mmol/L   Potassium 4.4 3.5 - 5.1 mmol/L   Chloride 97 (L) 98 - 111 mmol/L   CO2 26 22 - 32 mmol/L   Glucose, Bld 197 (H) 70 - 99 mg/dL    Comment: Glucose reference range applies only to samples taken after fasting for at least 8 hours.   BUN 13 6 - 20 mg/dL   Creatinine, Ser 0.67 0.44 - 1.00 mg/dL   Calcium 8.7 (L) 8.9 - 10.3 mg/dL   GFR calc non Af Amer >60 >60 mL/min   GFR calc Af Amer >60 >60 mL/min   Anion gap 10 5 - 15    Comment: Performed at [facility] [address] [place], [place] [address]   No results found.     Medical Problem List and Plan: 1.  Progressive paresthesias lower extremity weakness secondary to T8-9 metastatic tumor with epidural extension and severe myelopathy.S/P T7-8-9-T10 decompressive laminectomy right T9 transpedicular decompression with segmental pedicle screw fixation 01/04/2020.  No back brace required  -patient may not shower  -ELOS/Goals: Supervision/Min A  Admit to CIR 2.  Antithrombotics: -DVT/anticoagulation: SCDs.  Dopplers ordered.  Plan to discuss with neurosurgery question Lovenox  -antiplatelet therapy: N/A 3. Pain Management: Oxycodone as needed, Valium as needed muscle spasms  Monitor with increased exertion 4. Mood: Provide emotional support  -antipsychotic agents: N/A 5. Neuropsych: This patient is capable of making decisions on her own behalf. 6. Skin/Wound Care: Routine skin checks 7. Fluids/Electrolytes/Nutrition: Routine in and outs.  CMP ordered. 8.  Neurogenic bowel/bladder.  PVRs ordered.  Establish bowel program 9.  History of metastatic breast cancer.  Current plans to follow-up outpatient radiation oncology Dr. Sondra Come as well as oncology service Dr. Lindi Adie to establish radiation therapy.  Patient's [facility] is currently. 10.  Acute blood loss anemia.  CBC ordered. 11.  Leukocytosis: Likely steroid-induced.  CBC ordered.  Lavon Paganini Angiulli, PA-C 01/08/2020  I have personally performed a face to face diagnostic evaluation, including, but not limited to relevant history and physical exam findings, of this patient and developed relevant assessment and plan.  Additionally, I have reviewed and concur with the physician assistant's documentation above.  Delice Lesch, MD, ABPMR

## 2020-01-08 NOTE — Discharge Summary (Signed)
Physician Discharge Summary  Patient ID: Misty Arnold MRN: [id number] DOB/AGE: 51-Oct-1972 51 y.o.  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 Annette Stable. Patient's surgery was complicated by blood loss. The patient has continued to improve following surgery. Her mobility has improved. She is ready for discharge to inpatient rehab.  Consults: rehabilitation medicine  Significant Diagnostic Studies: radiology: DG Thoracic Spine 2 View  Result Date: 01/04/2020 CLINICAL DATA:  T8-9 laminectomy for tumor. T7-10 instrumentation. EXAM: THORACIC SPINE 2 VIEWS; DG C-ARM 1-60 MIN COMPARISON:  01/04/2020 MRI thoracic spine. FLUOROSCOPY TIME:  Fluoroscopy Time:  1 minutes 0 seconds Number of Acquired Spot Images: 2 FINDINGS: Nondiagnostic AP and lateral intraoperative fluoroscopic thoracic spine radiographs demonstrate placement of bilateral pedicle screws at multiple thoracic spine levels, numbering of which is not possible on these coned views. Temperature probe noted in the thoracic esophagus. IMPRESSION: Intraoperative fluoroscopic guidance for thoracic spine surgery. Electronically Signed   By: Ilona Sorrel M.D.   On: 01/04/2020 16:40   DG C-Arm 1-60 Min  Result Date: 01/04/2020 CLINICAL DATA:  T8-9 laminectomy for tumor. T7-10 instrumentation. EXAM: THORACIC SPINE 2 VIEWS; DG C-ARM 1-60 MIN COMPARISON:  01/04/2020 MRI thoracic  spine. FLUOROSCOPY TIME:  Fluoroscopy Time:  1 minutes 0 seconds Number of Acquired Spot Images: 2 FINDINGS: Nondiagnostic AP and lateral intraoperative fluoroscopic thoracic spine radiographs demonstrate placement of bilateral pedicle screws at multiple thoracic spine levels, numbering of which is not possible on these coned views. Temperature probe noted in the thoracic esophagus. IMPRESSION: Intraoperative fluoroscopic guidance for thoracic spine surgery. Electronically Signed   By: Ilona Sorrel M.D.   On: 01/04/2020 16:40     Treatments: surgery: T7-T8 T9-T10 decompressive laminectomy with resection of tumor including right T9 transpedicular decompression with resection of tumor, microdissection  T6-T7-T8 T9-T10-T11 posterior lateral arthrodesis utilizing morselized allograft and segmental pedicle screw fixation   Discharge Exam: Blood pressure 116/76, pulse 86, temperature 97.9 F (36.6 C), temperature source Oral, resp. rate 16, height 5\' 5"  (1.651 m), weight 49.9 kg, SpO2 100 %.   Alert and oriented x 4 PERRLA CN II-XII grossly intact MAE, Strength improving BLE, now with stronger plantar flexion in both feet, Still decreased light touch sensation in BLE Abdomen distended Incision is covered with Honeycomb dressing and Steri Strips; Dressing is clean, dry, and intact  Disposition: Discharge disposition: [facility] Not Defined        Allergies as of 01/08/2020      Reactions   Amoxicillin    Other reaction(s): rash/itching   Penicillins Rash   Did it involve swelling of the face/tongue/throat, SOB, or low BP? no Did it involve sudden or severe rash/hives, skin peeling, or any reaction on the inside of your mouth or nose? Yes Did you need to seek medical attention at a hospital or doctor's office? No When did it last happen?early 20's If all above answers are "NO", may proceed with cephalosporin use.   Tamoxifen Hypertension  Medication  List    STOP taking these medications   Fish Oil Oil   traMADol 50 MG tablet Commonly known as: ULTRAM     TAKE these medications   acetaminophen 325 MG tablet Commonly known as: TYLENOL Take 2 tablets (650 mg total) by mouth every 4 (four) hours as needed for mild pain ((score 1 to 3) or temp > 100.5).   amLODipine 10 MG tablet Commonly known as: NORVASC Take 10 mg by mouth daily.   bisacodyl 10 MG suppository Commonly known as: DULCOLAX Place 1 suppository (10 mg total) rectally daily as needed for moderate constipation.   BLACK COHOSH EXTRACT PO Take 1 tablet by mouth in the morning, at noon, and at bedtime.   BLACK CURRANT SEED OIL PO Take by mouth.   calcium carbonate 1250 (500 Ca) MG tablet Commonly known as: OS-CAL - dosed in mg of elemental calcium Take 1 tablet by mouth 3 (three) times daily with meals.   calcium-vitamin D 500-200 MG-UNIT tablet Commonly known as: OSCAL WITH D Take 1 tablet by mouth 2 (two) times daily with a meal.   dexamethasone 0.5 MG tablet Commonly known as: Decadron Take 4 tablets (2 mg total) by mouth every 6 (six) hours for 5 days.   diazepam 5 MG tablet Commonly known as: VALIUM Take 1-2 tablets (5-10 mg total) by mouth every 6 (six) hours as needed for muscle spasms.   ESTER C PO Take 1 tablet by mouth with breakfast, with lunch, and with evening meal.   Flaxseed Oil Oil Take 5 mLs by mouth in the morning, at noon, and at bedtime.   goserelin 3.6 MG injection Commonly known as: ZOLADEX Inject 3.6 mg into the skin every 28 (twenty-eight) days.   HYDROcodone-acetaminophen 10-325 MG tablet Commonly known as: NORCO Take 1 tablet by mouth every 4 (four) hours as needed for moderate pain ((score 4 to 6)).   letrozole 2.5 MG tablet Commonly known as: FEMARA Take 1 tablet (2.5 mg total) by mouth daily.   lidocaine-prilocaine cream Commonly known as: EMLA Apply 1 application topically as needed. Apply 1 hr prior to zoladex  injection.   MILK THISTLE PO Take 1 capsule by mouth in the morning, at noon, and at bedtime.   ondansetron 4 MG tablet Commonly known as: ZOFRAN Take 1 tablet (4 mg total) by mouth every 6 (six) hours as needed for nausea or vomiting.   sodium phosphate 7-19 GM/118ML Enem Place 133 mLs (1 enema total) rectally daily as needed for up to 7 doses for severe constipation.   VITAMIN C PO Take by mouth.   VITAMIN D PO Take 5,000 Units by mouth 2 (two) times daily.            Durable Medical Equipment  (From admission, onward)         Start     Ordered   01/04/20 1741  DME Walker rolling  Once    Question:  Patient needs a walker to treat with the following condition  Answer:  Metastasis of neoplasm to spinal canal [facility])   01/04/20 1740   01/04/20 1741  DME 3 n 1  Once     01/04/20 1740         Follow-up Information    [name], MD. Schedule an appointment as soon as possible for a visit in 2 week(s).   Specialty: Neurosurgery Contact information: [address] [place] [address] [place] [address] [phone]  Signed: Patricia Nettle 01/08/2020, 2:06 PM

## 2020-01-08 NOTE — Progress Notes (Signed)
Patient being discharged to CIR, report given to [facility] nurse. Belongings returned to patient

## 2020-01-08 NOTE — Progress Notes (Signed)
Jamse Arn, MD  Physician  Physical Medicine and Rehabilitation  PMR Pre-admission  Signed  Date of Service:  01/07/2020 10:35 AM      Related encounter: ED to Hosp-Admission (Discharged) from 01/04/2020 in [facility]      Signed        PMR Admission Coordinator Pre-Admission Assessment   Patient: Misty Arnold is an 49 y.o., female MRN: [id number] DOB: [birth year] Height: 5' 5"  (165.1 cm) Weight: 49.9 kg   Insurance Information HMO:     PPO: yes     PCP:      IPA:      80/20:      OTHER:  PRIMARY: [employer]      Policy#: [id number]      Subscriber: Patient CM Name: Lattie Haw      Phone#: [phone]     Fax#: [phone] Pre-Cert#: [id number]      Employer:  Josem Kaufmann provided by Lattie Haw for admit to CIR 3/5. Pt is approved with start date 3/5. Clinical updates are due weekly (by 3/12) to [employer] (f): [phone] (p); [phone] Benefits:  Phone #: online     Name: [email or web address], transaction # [id number] Eff. Date: 11/06/19-11/04/20     Deduct: does not have one      Out of Pocket Max: $5,900 ($87.89 met)      Life Max:  CIR: $355/day co-pay for days 1-4, $0/day co-pay for days 5-90      SNF: $0/day co-pay for days 1-20, $184/day co-pay for days 21-100; limited to 100 days/cal yr Outpatient: $10-$40/visit co-pay pending service provider; limited by medical necessity     Home Health: 100% coverage, 0% co-insurance; limited by medical necessity     DME: 80% coverage     Co-Pay: 20% co-insurance Providers:  SECONDARY: None      Policy#:       Subscriber:  CM Name:       Phone#:      Fax#:  Pre-Cert#:       Employer:  Benefits:  Phone #:      Name:  Eff. Date:      Deduct:       Out of Pocket Max:       Life Max:  CIR:       SNF:  Outpatient:      Co-Pay:  Home Health:       Co-Pay:  DME:      Co-Pay:    Medicaid Application Date:       Case Manager:  Disability Application Date:       Case Worker:    The "Data Collection  Information Summary" for patients in Inpatient Rehabilitation Facilities with attached "Privacy Act [place] Records" was provided and verbally reviewed with: Patient   Emergency Contact Information         Contact Information     Name Relation Home Work Mobile    [place] Significant other [place] Mother     [phone]         Current Medical History  Patient Admitting Diagnosis: 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 Pt presented to [facility] on 3/1 as a transfer from [facility]. She had originally presented to [facility] due to complaints of numbness and tingling that started in her feet and has progressed up to  her back over the past 3 days. Pt reports decreased sensation and weakness that progressed from her right leg to her left leg and had experienced several falls. While pt denies loss of bowel and bladder function she does endorse saddle anesthesia. 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 Neurosurgery recommended T8 and T9 decompressive laminectomy with tumor resection with need for T7-T10 pedical screw fixation. Pt undwernt surgery by Dr. Annette Stable on 3/1. Pt was evaluated by therapies with recommendation for CIR. Per radiation oncology, pt will need postop radiation therapy to begin 2 to 3 weeks after surgery and anticipate needing 3 weeks of radiation therapy on a daily basis.    Complete NIHSS TOTAL: 0   Patient's medical record from [facility] has been reviewed by the rehabilitation admission coordinator and physician.   Past Medical History      Past Medical History:  Diagnosis Date  . Abnormal uterine bleeding    . Anemia    . Breast cancer ([place])    . Depression     . Fibroid    . Hormone disorder    . Hypertension    . Infertility, female        Family History   family history includes Breast cancer in her maternal aunt; CAD in her paternal grandmother; Cancer in her maternal aunt, maternal grandfather, and paternal grandfather; Colon cancer in her maternal grandfather and paternal grandfather; Diabetes in her father, mother, and paternal grandmother; Heart attack in her maternal aunt, mother, and paternal grandmother; Heart disease in her mother; Heart failure in her paternal grandmother; Hyperlipidemia in her mother and paternal grandmother; Stroke in her father, maternal aunt, maternal grandfather, maternal grandmother, mother, and paternal grandmother.   Prior Rehab/Hospitalizations Has the patient had prior rehab or hospitalizations prior to admission? No   Has the patient had major surgery during 100 days prior to admission? Yes              Current Medications   Current Facility-Administered Medications:  .  0.9 %  sodium chloride infusion (Manually program via Guardrails IV Fluids), , Intravenous, Once, Pool, Mallie Mussel, MD .  0.9 %  sodium chloride infusion, 250 mL, Intravenous, Continuous, Pool, Henry, MD .  0.9 % NaCl with KCl 20 mEq/ L  infusion, , Intravenous, Continuous, Pool, Mallie Mussel, MD, Stopped at 01/05/20 1219 .  acetaminophen (TYLENOL) tablet 650 mg, 650 mg, Oral, Q4H PRN **OR** acetaminophen (TYLENOL) suppository 650 mg, 650 mg, Rectal, Q4H PRN, [name], MD .  amLODipine (NORVASC) tablet 10 mg, 10 mg, Oral, Daily, Pool, Mallie Mussel, MD, 10 mg at 01/08/20 1014 .  ascorbic acid (VITAMIN C) tablet 250 mg, 250 mg, Oral, Daily, Bergman, Meghan D, NP, 250 mg at 01/08/20 1014 .  bisacodyl (DULCOLAX) suppository 10 mg, 10 mg, Rectal, Daily PRN, [name], MD, 10 mg at 01/07/20 1141 .  calcium-vitamin D (OSCAL WITH D) 500-200 MG-UNIT per tablet 1 tablet, 1 tablet, Oral, BID WC, [name], MD, 1 tablet at 01/08/20 0800 .  Chlorhexidine Gluconate  Cloth 2 % PADS 6 each, 6 each, Topical, Daily, [name], MD, 6 each at 01/08/20 1018 .  cholecalciferol (VITAMIN D3) tablet 5,000 Units, 5,000 Units, Oral, BID, [name], MD, 5,000 Units at 01/08/20 1012 .  dexamethasone (DECADRON) injection 4 mg, 4 mg, Intravenous, Q6H, 4 mg at 01/08/20 0513 **OR** dexamethasone (DECADRON)  tablet 4 mg, 4 mg, Oral, Q6H, Pool, Mallie Mussel, MD, 4 mg at 01/08/20 1239 .  diazepam (VALIUM) tablet 5-10 mg, 5-10 mg, Oral, Q6H PRN, [name], MD .  HYDROcodone-acetaminophen (NORCO) 10-325 MG per tablet 1 tablet, 1 tablet, Oral, Q4H PRN, [name], MD, 1 tablet at 01/07/20 1536 .  HYDROmorphone (DILAUDID) injection 1 mg, 1 mg, Intravenous, Q2H PRN, [name], MD, 1 mg at 01/08/20 1136 .  menthol-cetylpyridinium (CEPACOL) lozenge 3 mg, 1 lozenge, Oral, PRN **OR** phenol (CHLORASEPTIC) mouth spray 1 spray, 1 spray, Mouth/Throat, PRN, Pool, Henry, MD .  ondansetron (ZOFRAN) tablet 4 mg, 4 mg, Oral, Q6H PRN **OR** ondansetron (ZOFRAN) injection 4 mg, 4 mg, Intravenous, Q6H PRN, Pool, Mallie Mussel, MD .  oxyCODONE (Oxy IR/ROXICODONE) immediate release tablet 10 mg, 10 mg, Oral, Q3H PRN, [name], MD, 10 mg at 01/07/20 0403 .  polyethylene glycol (MIRALAX / GLYCOLAX) packet 17 g, 17 g, Oral, Daily PRN, [name], MD, 17 g at 01/07/20 1140 .  sodium chloride flush (NS) 0.9 % injection 3 mL, 3 mL, Intravenous, Q12H, Pool, Mallie Mussel, MD, 3 mL at 01/08/20 1017 .  sodium chloride flush (NS) 0.9 % injection 3 mL, 3 mL, Intravenous, PRN, [name], MD .  sodium phosphate (FLEET) 7-19 GM/118ML enema 1 enema, 1 enema, Rectal, Once PRN, [name], MD .  sodium phosphate (FLEET) 7-19 GM/118ML enema 1 enema, 1 enema, Rectal, Daily PRN, Reinaldo Meeker, Meghan D, NP   Facility-Administered Medications Ordered in Other Encounters:  .  goserelin (ZOLADEX) injection 3.6 mg, 3.6 mg, Subcutaneous, Once, Nicholas Lose, MD   Patients Current Diet:     Diet Order                      Diet regular  Room service appropriate? Yes; Fluid consistency: Thin  Diet effective now                   Precautions / Restrictions Precautions Precautions: Back Precaution Booklet Issued: No Precaution Comments: Reviewed back precautions.  Other Brace: No brace needed per MD order Restrictions Weight Bearing Restrictions: No    Has the patient had 2 or more falls or a fall with injury in the past year? Yes   Prior Activity Level Community (5-7x/wk): on disability but was still driving, independent, and community dwelling   Prior Functional Level Self Care: Did the patient need help bathing, dressing, using the toilet or eating? Independent   Indoor Mobility: Did the patient need assistance with walking from room to room (with or without device)? Independent   Stairs: Did the patient need assistance with internal or external stairs (with or without device)? Independent   Functional Cognition: Did the patient need help planning regular tasks such as shopping or remembering to take medications? Independent   Home Assistive Devices / Equipment Home Assistive Devices/Equipment: None Home Equipment: None   Prior Device Use: Indicate devices/aids used by the patient prior to current illness, exacerbation or injury? as weakness progressed, pt began using Cane   Current Functional Level Cognition   Overall Cognitive Status: Within Functional Limits for tasks assessed Orientation Level: Oriented X4    Extremity Assessment (includes Sensation/Coordination)   Upper Extremity Assessment: Generalized weakness  Lower Extremity Assessment: Defer to PT evaluation RLE Deficits / Details: gross movement only, numbness, decreased proprioception, RLE Coordination: decreased fine motor, decreased gross motor LLE Deficits / Details: generalized weakness, movement uncoordinated, decreased proprioception.     ADLs   Overall ADL's :  Needs assistance/impaired Eating/Feeding: Set up, Sitting Grooming:  Wash/dry face, Sitting, Supervision/safety, Set up, Oral care, Min guard Grooming Details (indicate cue type and reason): Completed oral care in standing at sink, washing face in sitting Upper Body Bathing: Minimal assistance, Sitting Lower Body Bathing: Maximal assistance, Sit to/from stand Upper Body Dressing : Minimal assistance, Sitting Lower Body Dressing: Maximal assistance, Sit to/from stand Toilet Transfer: Min guard, Cueing for safety Toilet Transfer Details (indicate cue type and reason): Simlated with sit<>stands from bed and chair Functional mobility during ADLs: Minimal assistance, Min guard General ADL Comments: Pt continues to have decreased balance but improving greatly     Mobility   Overal bed mobility: Needs Assistance Bed Mobility: Rolling, Sidelying to Sit Rolling: Mod assist Sidelying to sit: Mod assist General bed mobility comments: Pt seated edge of bed on arrival.     Transfers   Overall transfer level: Needs assistance Equipment used: Rolling walker (2 wheeled) Transfers: Sit to/from Stand Sit to Stand: Min assist, From elevated surface Stand pivot transfers: Min assist, Mod assist General transfer comment: Min for transfers and mobility, requires increased time cues for hand placement to avoid tipping the RW.     Ambulation / Gait / Stairs / Wheelchair Mobility   Ambulation/Gait Ambulation/Gait assistance: Min assist, Mod assist Gait Distance (Feet): 70 Feet Assistive device: Rolling walker (2 wheeled) Gait Pattern/deviations: Step-through pattern, Wide base of support, Ataxic, Trunk flexed, Shuffle, Scissoring General Gait Details: weak and uncoordinated steps with B weakness.  Close chair follow for safety.     Posture / Balance Dynamic Sitting Balance Sitting balance - Comments: more confident with bil/single UE's due to numbness in buttocks. Balance Overall balance assessment: Needs assistance Sitting-balance support: No upper extremity supported,  Bilateral upper extremity supported Sitting balance-Leahy Scale: Fair Sitting balance - Comments: more confident with bil/single UE's due to numbness in buttocks. Standing balance support: During functional activity, Single extremity supported Standing balance-Leahy Scale: Poor Standing balance comment: braced on sink when completing functional tasks     Special needs/care consideration BiPAP/CPAP : no CPM : no Continuous Drip IV : 0.9% sodium chloride infusion, 0.9% NaCl with KCL 20 m/Eq/L infusion  Dialysis : no        Days : no Life Vest : no Oxygen: no, on RA Special Bed : no Trach Size : no Wound Vac (area) : no      Location : no Skin: surgical incision to back, wound to right breast, closed system drain (hemovac) to superior, medial, back.                           Bowel mgmt: no value recorded at this time.  Bladder mgmt: continent Diabetic mgmt: no Behavioral consideration : no Chemo/radiation : Per radiation oncology, pt will be set up for postop radiation therapy to begin 2-3 weeks after surgery depending on rec's from neurosurgery and postop recovery. Pt may need daily radiation therapy for an anticipated 3 weeks.     Previous Home Environment (from acute therapy documentation) Living Arrangements: Other relatives(Mom when out of rehab) Available Help at Discharge: Family, Available 24 hours/day(mom can't lift) Type of Home: House Home Layout: Two level, Bed/bath upstairs Alternate Level Stairs-Number of Steps: 1 flight Home Access: Stairs to enter Entrance Stairs-Rails: None Entrance Stairs-Number of Steps: 2 Bathroom Shower/Tub: Multimedia programmer: [place]: No   Discharge Living Setting Plans for Discharge Living Setting: Other (Comment)(pt working on  accessible housing (ramped entrance)) Type of Home at Discharge: House Discharge Home Layout: pt reports it will be one level Discharge Home Access: pt reports it will be a ramped  entrance Discharge Bathroom Shower/Tub: unknown at this time. Discharge Bathroom Toilet: unknown Discharge Bathroom Accessibility: unknown, but pt is working towards an accessible house Does the patient have any problems obtaining your medications?: No   Social/Family/Support Systems Patient Roles: Other (Comment)(will have family and friend support at [place]. ) Contact Information: mother: [phone]; Eula Flax [phone]) Anticipated Caregiver: mother + friends Eula Flax) Anticipated Caregiver's Contact Information: see above Ability/Limitations of Caregiver: 24/7 supervision (pt still working out details of plan) but spoke to mom who is willing to help as she can. Caregiver Availability: 24/7 Discharge Plan Discussed with Primary Caregiver: yes with her mom Is Caregiver In Agreement with Plan?: Yes Does Caregiver/Family have Issues with Lodging/Transportation while Pt is in Rehab?: No   Goals/Additional Needs Patient/Family Goal for Rehab: PT/OT: Supervision; SLP: NA Expected length of stay: 10-14 days Cultural Considerations: NA Dietary Needs: regular diet with thin liquids Equipment Needs: TBD Special Service Needs: may need radiation/chemo while in IP Rehab Pt/Family Agrees to Admission and willing to participate: Yes Program Orientation Provided & Reviewed with Pt/Caregiver Including Roles  & Responsibilities: Yes(pt)  Barriers to Discharge: Pending chemo/radiation, Other (comments)(pt working on details of 24/7 A w/ family/friends today)   Decrease burden of Care through IP rehab admission: NA   Possible need for SNF placement upon discharge: Not anticipated. Pt is aware she will need 24/7 supervision at DC and is prepared to have that set up at DC. Anticipate pt will make great gains while in CIR and can progress to a supervision level.    Patient Condition: I have reviewed medical records from [facility], spoken with RN, NP, and patient. I met  with patient at the bedside for inpatient rehabilitation assessment.  Patient will benefit from ongoing PT and OT, can actively participate in 3 hours of therapy a day 5 days of the week, and can make measurable gains during the admission.  Patient will also benefit from the coordinated team approach during an Inpatient Acute Rehabilitation admission.  The patient will receive intensive therapy as well as Rehabilitation physician, nursing, social worker, and care management interventions.  Due to safety, skin/wound care, disease management, medication administration, pain management and patient education the patient requires 24 hour a day rehabilitation nursing.  The patient is currently Mod A with mobility and basic ADLs.  Discharge setting and therapy post discharge at home with home health is anticipated.  Patient has agreed to participate in the Acute Inpatient Rehabilitation Program and will admit 01/08/20.   Preadmission Screen Completed By:  Raechel Ache, 01/08/2020 1:38 PM ________________   Discussed status with Dr. Posey Pronto on 01/08/20 at 1:33PM and received approval for admission today.   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 Does the need for close, 24 hr/day Medical supervision in concert with the patient's rehab needs make it unreasonable for this patient to be served in a less intensive setting? Yes 2. Co-Morbidities requiring supervision/potential complications:  metastatic breast cancer, HTN (monitor and provide prns in accordance with increased physical exertion and pain), depression (ensure mood does not hinder progress of therapies), 3. Due to bladder management, bowel management, safety, skin/wound care, disease management, pain management and patient education, does  the patient require 24 hr/day rehab nursing? Yes 4. Does the patient require coordinated care  of a physician, rehab nurse, PT, OT to address physical and functional deficits in the context of the above medical diagnosis(es)? Yes Addressing deficits in the following areas: balance, endurance, locomotion, strength, transferring, bathing, dressing, toileting and psychosocial support 5. Can the patient actively participate in an intensive therapy program of at least 3 hrs of therapy 5 days a week? Yes 6. The potential for patient to make measurable gains while on inpatient rehab is excellent 7. Anticipated functional outcomes upon discharge from inpatient rehab: supervision and min assist PT, supervision OT, n/a SLP 8. Estimated rehab length of stay to reach the above functional goals is: 12-16 days. 9. Anticipated discharge destination: Home 10. Overall Rehab/Functional Prognosis: good and fair     MD Signature: Delice Lesch, MD, ABPMR          Revision History Date/Time User Provider Type Action  01/08/2020  1:42 PM Jamse Arn, MD Physician Sign  01/08/2020  1:38 PM Raechel Ache, [facility] Admission Coordinator Share  01/08/2020  1:38 PM Raechel Ache, [place] [facility] Admission Coordinator Share  01/08/2020  1:34 PM Raechel Ache, [place] Admission Coordinator Share   View Details Report

## 2020-01-08 NOTE — Progress Notes (Signed)
Occupational Therapy Treatment Patient Details Name: Misty Arnold MRN: [id number] DOB: [birth year] Today's Date: 01/08/2020    History of present illness Pt is a 49 y/o female with known metastatic breast CA and HTN.  Pt with metastatic tumor at T8/T9.  S/P T6-T11 PLA with morselized allograft and segmental pedical screw fixation.   OT comments  Patient continues to make steady progress towards goals in skilled OT session. Patient's session encompassed functional mobility, transfers (chair and BSC), and ADLs at the sink. Pt required reiteration of acronym for back precautions (BLT) and min cues for adherence at sink. Pt with decreased gait speed in session to date, however extremely motivated to move. Pt remains an excellent candidate for CIR due to decreased activity tolerance, excellent family support, and overall motivation; will follow acutely.    Follow Up Recommendations  CIR    Equipment Recommendations  Other (comment)(Defer to next venue of care)    Recommendations for Other Services      Precautions / Restrictions Precautions Precautions: Back Precaution Booklet Issued: No Precaution Comments: Reviewed back precautions.  Required Braces or Orthoses: Other Brace Other Brace: No brace needed per MD order Restrictions Weight Bearing Restrictions: No       Mobility Bed Mobility Overal bed mobility: Needs Assistance Bed Mobility: Rolling;Sidelying to Sit   Sidelying to sit: Min guard;Min assist          Transfers Overall transfer level: Needs assistance Equipment used: Rolling walker (2 wheeled) Transfers: Sit to/from Stand Sit to Stand: Min assist Stand pivot transfers: Min assist;Mod assist       General transfer comment: Min for transfers and mobility, due to balance keep a close chair follow    Balance Overall balance assessment: Needs assistance Sitting-balance support: No upper extremity supported;Bilateral upper extremity supported Sitting  balance-Leahy Scale: Fair     Standing balance support: During functional activity;Single extremity supported;Bilateral upper extremity supported Standing balance-Leahy Scale: Poor Standing balance comment: braced on sink when completing functional tasks then dependent on external device in ambulation                           ADL either performed or assessed with clinical judgement   ADL Overall ADL's : Needs assistance/impaired     Grooming: Wash/dry face;Sitting;Supervision/safety;Set up;Oral care;Min guard Grooming Details (indicate cue type and reason): Completed oral care in standing at sink, washing face in sitting, cueing for precautions when brushing teeth     Lower Body Bathing: Sit to/from stand;Minimal assistance Lower Body Bathing Details (indicate cue type and reason): peri care, clothing management         Toilet Transfer: Min guard;Cueing for safety;BSC Toilet Transfer Details (indicate cue type and reason): Simulated with sit<>stands from bed and chair and BSC Toileting- Clothing Manipulation and Hygiene: Minimal assistance;Cueing for sequencing Toileting - Clothing Manipulation Details (indicate cue type and reason): min A for clothing managment     Functional mobility during ADLs: Minimal assistance;Min guard General ADL Comments: Pt continues to have decreased balance but improving greatly     Vision       Perception     Praxis      Cognition Arousal/Alertness: Awake/alert Behavior During Therapy: WFL for tasks assessed/performed Overall Cognitive Status: Within Functional Limits for tasks assessed  Exercises     Shoulder Instructions       General Comments      Pertinent Vitals/ Pain       Pain Assessment: No/denies pain  Home Living                                          Prior Functioning/Environment              Frequency  Min 2X/week         Progress Toward Goals  OT Goals(current goals can now be found in the care plan section)  Progress towards OT goals: Progressing toward goals  Acute Rehab OT Goals Patient Stated Goal: Be independent again OT Goal Formulation: With patient Time For Goal Achievement: 01/19/20 Potential to Achieve Goals: Good  Plan Discharge plan remains appropriate    Co-evaluation                 AM-PAC OT "6 Clicks" Daily Activity     Outcome Measure   Help from another person eating meals?: None Help from another person taking care of personal grooming?: A Little Help from another person toileting, which includes using toliet, bedpan, or urinal?: A Little Help from another person bathing (including washing, rinsing, drying)?: A Little Help from another person to put on and taking off regular upper body clothing?: A Little Help from another person to put on and taking off regular lower body clothing?: A Lot 6 Click Score: 18    End of Session Equipment Utilized During Treatment: Gait belt;Rolling walker  OT Visit Diagnosis: Unsteadiness on feet (R26.81);Other abnormalities of gait and mobility (R26.89);Pain   Activity Tolerance Patient tolerated treatment well   Patient Left in chair;with call bell/phone within reach;with chair alarm set   Nurse Communication Mobility status        Time: 1336-1400 OT Time Calculation (min): 24 min  Charges: OT Treatments $Self Care/Home Management : 23-37 mins  Corinne Ports E. Hiromi Knodel, COTA/L Acute Rehabilitation Services [place] 01/08/2020, 2:15 PM

## 2020-01-08 NOTE — H&P (Signed)
Physical Medicine and Rehabilitation Admission H&P    Chief Complaint  Patient presents with  . Numbness  . Allergic Reaction  : HPI: Misty Arnold is a 49 year old right-handed female with history of metastatic breast cancer diagnosed November 2011 with mastectomy 08/29/2015 followed by reconstruction and followed by Dr. Lindi Adie and had been maintained on Zoladex and letrozole and later changed to [facility].  History taken from chart review and patient.  Patient had previously been recommended radiation therapy for palliation but she initially declined.  She presented on 01/04/2020 with increasing back pain as well as lower extremity weakness.  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  She underwent T7-8-9-T10 decompressive laminectomy with resection of tumor including right T9 transpedicular decompression with resection of tumor/segmental pedicle screw fixation on 01/04/2020 per Dr. Annette Stable.  No back brace required.  Pathology report consistent with metastatic poorly differentiated carcinoma.  Follow-up radiation oncology Dr. Dwana Curd and plan is for radiation therapy to begin in 2 to 3 weeks after recovery from recent back surgery.  Hospital course further complicated by acute blood loss anemia and leukocytosis.  Hemoglobin 9.2.  Leukocytosis 16,000 felt to be induced by Decadron protocol.  Patient is maintained on a regular diet.  Therapy evaluations completed and patient was admitted for a comprehensive rehab program.  Please see preadmission assessment from earlier today as well.  Review of Systems  Constitutional: Positive for malaise/fatigue and weight loss. Negative for chills and fever.  HENT: Negative for hearing loss.   Eyes: Negative for blurred vision and double vision.  Respiratory: Negative for cough.   Cardiovascular: Negative for chest pain and palpitations.  Gastrointestinal: Positive for constipation. Negative for heartburn,  nausea and vomiting.  Genitourinary: Positive for urgency. Negative for dysuria, flank pain and hematuria.  Musculoskeletal: Positive for back pain, joint pain and myalgias.  Skin: Negative for rash.  Neurological: Positive for sensory change, focal weakness and weakness.  Psychiatric/Behavioral: Positive for depression.       Insomnia   Past Medical History:  Diagnosis Date  . Abnormal uterine bleeding   . Anemia   . Breast cancer ([place])   . Depression   . Fibroid   . Hormone disorder   . Hypertension   . Infertility, female    Past Surgical History:  Procedure Laterality Date  . ABDOMINAL HYSTERECTOMY    . BREAST SURGERY    . LAMINECTOMY N/A 01/04/2020   Procedure: Thoracic Eight, Thoracic Nine THORACIC LAMINECTOMY FOR TUMOR with Thoracic Seven-Thoracic Ten instrumentation;  Surgeon: [name], MD;  Location: [facility];  Service: Neurosurgery;  Laterality: N/A;  Thoracic Eight, Thoracic Nine THORACIC LAMINECTOMY FOR TUMOR with Thoracic Seven-Thoracic Ten instrumentation  . MASTECTOMY    . salivary gland stone     Family History  Problem Relation Age of Onset  . Cancer Maternal Aunt        breast  . Stroke Maternal Aunt   . Heart attack Maternal Aunt   . Cancer Maternal Grandfather   . Colon cancer Maternal Grandfather   . Stroke Maternal Grandfather   . Cancer Paternal Grandfather   . Colon cancer Paternal Grandfather   . Stroke Mother   . Heart attack Mother   . Diabetes Mother   . Hyperlipidemia Mother   . Heart disease Mother   . Diabetes Father   . Stroke Father   . Stroke Paternal Grandmother   . Heart attack Paternal Grandmother   . Heart failure Paternal  Grandmother   . Diabetes Paternal Grandmother   . Hyperlipidemia Paternal Grandmother   . CAD Paternal Grandmother   . Stroke Maternal Grandmother   . Breast cancer Maternal Aunt    Social History:  reports that she has never smoked. She has never used smokeless tobacco. She reports that she does not drink  alcohol or use drugs. Allergies:  Allergies  Allergen Reactions  . Amoxicillin     Other reaction(s): rash/itching  . Penicillins Rash    Did it involve swelling of the face/tongue/throat, SOB, or low BP? no Did it involve sudden or severe rash/hives, skin peeling, or any reaction on the inside of your mouth or nose? Yes Did you need to seek medical attention at a hospital or doctor's office? No When did it last happen?early 20's If all above answers are "NO", may proceed with cephalosporin use.  . Tamoxifen Hypertension   Medications Prior to Admission  Medication Sig Dispense Refill  . amLODipine (NORVASC) 10 MG tablet Take 10 mg by mouth daily.     . Ascorbic Acid (VITAMIN C PO) Take by mouth.    Marland Kitchen Bioflavonoid Products (ESTER C PO) Take 1 tablet by mouth with breakfast, with lunch, and with evening meal.     . BLACK COHOSH EXTRACT PO Take 1 tablet by mouth in the morning, at noon, and at bedtime.    Marland Kitchen BLACK CURRANT SEED OIL PO Take by mouth.    . calcium carbonate (OS-CAL - DOSED IN MG OF ELEMENTAL CALCIUM) 1250 (500 Ca) MG tablet Take 1 tablet by mouth 3 (three) times daily with meals.    . Cholecalciferol (VITAMIN D PO) Take 5,000 Units by mouth 2 (two) times daily.     . Fish Oil OIL 5 mLs by Does not apply route in the morning, at noon, and at bedtime.     . Flaxseed Oil OIL Take 5 mLs by mouth in the morning, at noon, and at bedtime.     Marland Kitchen goserelin (ZOLADEX) 3.6 MG injection Inject 3.6 mg into the skin every 28 (twenty-eight) days.    Marland Kitchen letrozole (FEMARA) 2.5 MG tablet Take 1 tablet (2.5 mg total) by mouth daily. 90 tablet 1  . lidocaine-prilocaine (EMLA) cream Apply 1 application topically as needed. Apply 1 hr prior to zoladex injection. 30 g 0  . MILK THISTLE PO Take 1 capsule by mouth in the morning, at noon, and at bedtime.    . calcium-vitamin D (OSCAL WITH D) 500-200 MG-UNIT tablet Take 1 tablet by mouth 2 (two) times daily with a meal. (Patient not taking: Reported  on 01/04/2020) 180 tablet 1  . traMADol (ULTRAM) 50 MG tablet Take 1 tablet (50 mg total) by mouth every 6 (six) hours as needed. (Patient not taking: Reported on 01/04/2020) 60 tablet 0    Drug Regimen Review Drug regimen was reviewed and remains appropriate with no significant issues identified  Home: Home Living Family/patient expects to be discharged to:: Private residence Living Arrangements: Other relatives(Mom when out of rehab) Available Help at Discharge: Family, Available 24 hours/day(mom can't lift) Type of Home: House Home Access: Stairs to enter [employer] of Steps: 2 Entrance Stairs-Rails: None Home Layout: Two level, Bed/bath upstairs Alternate Level Stairs-Number of Steps: 1 flight Bathroom Shower/Tub: Multimedia programmer: Standard Home Equipment: None   Functional History: Prior Function Level of Independence: Independent(up until past Thursday)  Functional Status:  Mobility: Bed Mobility Overal bed mobility: Needs Assistance Bed Mobility: Rolling, Sidelying to Sit  Rolling: Mod assist Sidelying to sit: Mod assist General bed mobility comments: Pt seated edge of bed on arrival. Transfers Overall transfer level: Needs assistance Equipment used: Rolling walker (2 wheeled) Transfers: Sit to/from Stand Sit to Stand: Min assist, From elevated surface Stand pivot transfers: Min assist, Mod assist General transfer comment: Min for transfers and mobility, requires increased time cues for hand placement to avoid tipping the RW. Ambulation/Gait Ambulation/Gait assistance: Min assist, Mod assist Gait Distance (Feet): 70 Feet Assistive device: Rolling walker (2 wheeled) Gait Pattern/deviations: Step-through pattern, Wide base of support, Ataxic, Trunk flexed, Shuffle, Scissoring General Gait Details: weak and uncoordinated steps with B weakness.  Close chair follow for safety.    ADL: ADL Overall ADL's : Needs  assistance/impaired Eating/Feeding: Set up, Sitting Grooming: Wash/dry face, Sitting, Supervision/safety, Set up, Oral care, Min guard Grooming Details (indicate cue type and reason): Completed oral care in standing at sink, washing face in sitting Upper Body Bathing: Minimal assistance, Sitting Lower Body Bathing: Maximal assistance, Sit to/from stand Upper Body Dressing : Minimal assistance, Sitting Lower Body Dressing: Maximal assistance, Sit to/from stand Toilet Transfer: Min guard, Cueing for safety Toilet Transfer Details (indicate cue type and reason): Simlated with sit<>stands from bed and chair Functional mobility during ADLs: Minimal assistance, Min guard General ADL Comments: Pt continues to have decreased balance but improving greatly  Cognition: Cognition Overall Cognitive Status: Within Functional Limits for tasks assessed Orientation Level: Oriented X4 Cognition Arousal/Alertness: Awake/alert Behavior During Therapy: WFL for tasks assessed/performed Overall Cognitive Status: Within Functional Limits for tasks assessed  Physical Exam: Blood pressure 127/81, pulse 69, temperature 98.4 F (36.9 C), temperature source Oral, resp. rate 16, height 5\' 5"  (1.651 m), weight 49.9 kg, SpO2 100 %. Physical Exam  Vitals reviewed. Constitutional: She appears well-developed.  Frail  HENT:  Head: Normocephalic and atraumatic.  Eyes: EOM are normal. Right eye exhibits no discharge. Left eye exhibits no discharge.  Neck: No tracheal deviation present. No thyromegaly present.  Respiratory: Effort normal. No respiratory distress.  GI: She exhibits no distension.  Musculoskeletal:     Comments: No edema or tenderness in extremities  Neurological: She is alert.  Follows commands oriented x3. Motor: Bilateral upper extremities: 4-4+/5 proximal distal Right lower extremity: Flexion, extension 4 medicine/5, ankle dorsiflexion 2/5 Left lower extremity: Flexion, knee extension 4+/5,  ankle dorsiflexion 4/5 Sensation diminished to light touch right foot  Skin:  Dressing C/D/I  Psychiatric: She has a normal mood and affect. Her behavior is normal. Thought content normal.    Results for orders placed or performed during the hospital encounter of 01/04/20 (from the past 48 hour(s))  CBC     Status: Abnormal   Collection Time: 01/06/20 11:35 AM  Result Value Ref Range   WBC 16.2 (H) 4.0 - 10.5 K/uL   RBC 3.05 (L) 3.87 - 5.11 MIL/uL   Hemoglobin 9.2 (L) 12.0 - 15.0 g/dL   HCT 26.9 (L) 36.0 - 46.0 %   MCV 88.2 80.0 - 100.0 fL   MCH 30.2 26.0 - 34.0 pg   MCHC 34.2 30.0 - 36.0 g/dL   RDW 13.2 11.5 - 15.5 %   Platelets 191 150 - 400 K/uL   nRBC 0.0 0.0 - 0.2 %    Comment: Performed at [facility], [place] [address], [place], [place] [id number]  Basic metabolic panel     Status: Abnormal   Collection Time: 01/06/20 11:35 AM  Result Value Ref Range   Sodium 133 (L) 135 -  145 mmol/L   Potassium 4.4 3.5 - 5.1 mmol/L   Chloride 97 (L) 98 - 111 mmol/L   CO2 26 22 - 32 mmol/L   Glucose, Bld 197 (H) 70 - 99 mg/dL    Comment: Glucose reference range applies only to samples taken after fasting for at least 8 hours.   BUN 13 6 - 20 mg/dL   Creatinine, Ser 0.67 0.44 - 1.00 mg/dL   Calcium 8.7 (L) 8.9 - 10.3 mg/dL   GFR calc non Af Amer >60 >60 mL/min   GFR calc Af Amer >60 >60 mL/min   Anion gap 10 5 - 15    Comment: Performed at [facility] [address], [place], [place] [address]   No results found.     Medical Problem List and Plan: 1.  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  No back brace required.  -patient may not shower  -ELOS/Goals: Supervision/Min A  Admit to CIR 2.  Antithrombotics: -DVT/anticoagulation:  SCDs.  Dopplers ordered. Plan to discuss with neurosurgery question Lovenox  -antiplatelet therapy: N/A 3. Pain Management: Oxycodone as needed, Valium as needed muscle spasms  Monitor with increased exertion 4. Mood: Provide emotional support  -antipsychotic agents: N/A 5. Neuropsych: This patient is capable of making decisions on her own behalf. 6. Skin/Wound Care: Routine skin checks 7. Fluids/Electrolytes/Nutrition: Routine in and outs.  CMP ordered. 8.  Neurogenic bowel/bladder.  PVRs ordered.  Establish bowel program 9.  History of metastatic breast cancer.  Current plans to follow-up outpatient radiation oncology Dr. Sondra Come as well as oncology service Dr. Lindi Adie to establish radiation therapy.  Patient's [facility] is currently. 10.  Acute blood loss anemia.  CBC ordered. 11.  Leukocytosis: Likely steroid-induced.  CBC ordered.  Lavon Paganini Angiulli, PA-C 01/08/2020  I have personally performed a face to face diagnostic evaluation, including, but not limited to relevant history and physical exam findings, of this patient and developed relevant assessment and plan.  Additionally, I have reviewed and concur with the physician assistant's documentation above.  Delice Lesch, MD, ABPMR   The patient's status has not changed. The original post admission physician evaluation remains appropriate, and any changes from the pre-admission screening or documentation from the acute chart are noted above.   Delice Lesch, MD, ABPMR

## 2020-01-09 ENCOUNTER — Inpatient Hospital Stay (HOSPITAL_COMMUNITY): Payer: Medicare PPO

## 2020-01-09 ENCOUNTER — Inpatient Hospital Stay (HOSPITAL_COMMUNITY): Payer: Medicare PPO | Admitting: Physical Therapy

## 2020-01-09 DIAGNOSIS — G8222 Paraplegia, incomplete: Secondary | ICD-10-CM

## 2020-01-09 NOTE — Progress Notes (Signed)
Patient's wounds cleansed with sterile normal saline and dried lightly with gauze. White slough tissue removed with saline cleansing. Skin beneath is pink with some yellow exudate. Xeroform gauze applied to area affected areas. ABD pad kerlex and ace wrap applied as directed. Pt tolerated well. Patient educated to Xeroform gauze and rationale of use. Patient watched as wounds cleansed and slough tissue removed to reveal healthy tissue in some areas.  Patient tolerated well states there is no pain at site.

## 2020-01-09 NOTE — Progress Notes (Signed)
[facility] PHYSICAL MEDICINE & REHABILITATION PROGRESS NOTE   Subjective/Complaints: Misty Arnold was upset that she called to be taken to bathroom to urinate last night and fell asleep before anyone got to her, and that her dressing was soiled with urine this morning. She requests bedpan be placed by bedside in case she has future similar episode.    Objective:   No results found. No results for input(s): WBC, HGB, HCT, PLT in the last 72 hours. No results for input(s): NA, K, CL, CO2, GLUCOSE, BUN, CREATININE, CALCIUM in the last 72 hours.  Intake/Output Summary (Last 24 hours) at 01/09/2020 1602 Last data filed at 01/09/2020 1230 Gross per 24 hour  Intake 486 ml  Output --  Net 486 ml     Physical Exam: Vital Signs Blood pressure 137/84, pulse (!) 106, temperature 98.7 F (37.1 C), temperature source Oral, resp. rate 18, height 5\' 5"  (1.651 m), weight 55.4 kg, SpO2 100 %. Vitals reviewed. Constitutional: She appears well-developed.  Frail  HENT:  Head: Normocephalic and atraumatic.  Eyes: EOM are normal. Right eye exhibits no discharge. Left eye exhibits no discharge.  Neck: No tracheal deviation present. No thyromegaly present.  Respiratory: Effort normal. No respiratory distress.  GI: She exhibits no distension.  Musculoskeletal:     Comments: No edema or tenderness in extremities  Neurological: She is alert.  Follows commands oriented x3. Motor: Bilateral upper extremities: 4-4+/5 proximal distal Right lower extremity: Flexion, extension 4 medicine/5, ankle dorsiflexion 2/5 Left lower extremity: Flexion, knee extension 4+/5, ankle dorsiflexion 4/5 Sensation diminished to light touch right foot  Skin:  Dressing C/D/I  Psychiatric: She has a normal mood and affect. Her behavior is normal. Thought content normal.     Assessment/Plan: 1. Functional deficits secondary to progressive paresthesias lower extremity weakness secondary to T8-9 metastatic tumor with epidural  extension and severe myelopathy, incomplete paraparesis .S/P T7-8-9-T10 decompressive laminectomy right T9 transpedicular decompression with segmental pedicle screw fixation 01/04/2020 which require 3+ hours per day of interdisciplinary therapy in a comprehensive inpatient rehab setting.  Physiatrist is providing close team supervision and 24 hour management of active medical problems listed below.  Physiatrist and rehab team continue to assess barriers to discharge/monitor patient progress toward functional and medical goals  Care Tool:  Bathing    Body parts bathed by patient: Right arm, Left arm, Chest, Abdomen, Front perineal area, Buttocks, Right upper leg, Left upper leg, Face   Body parts bathed by helper: Right lower leg, Left lower leg     Bathing assist Assist Level: Minimal Assistance - Patient > 75%     Upper Body Dressing/Undressing Upper body dressing   What is the patient wearing?: Pull over shirt    Upper body assist Assist Level: Supervision/Verbal cueing    Lower Body Dressing/Undressing Lower body dressing      What is the patient wearing?: Pants     Lower body assist Assist for lower body dressing: Moderate Assistance - Patient 50 - 74%     Toileting Toileting    Toileting assist Assist for toileting: Maximal Assistance - Patient 25 - 49%     Transfers Chair/bed transfer  Transfers assist     Chair/bed transfer assist level: Moderate Assistance - Patient 50 - 74%     Locomotion Ambulation   Ambulation assist      Assist level: Moderate Assistance - Patient 50 - 74% Assistive device: Walker-rolling Max distance: 40   Walk 10 feet activity   Assist  Assist level: Moderate Assistance - Patient - 50 - 74% Assistive device: Walker-rolling   Walk 50 feet activity   Assist Walk 50 feet with 2 turns activity did not occur: Safety/medical concerns         Walk 150 feet activity   Assist Walk 150 feet activity did not occur:  Safety/medical concerns         Walk 10 feet on uneven surface  activity   Assist Walk 10 feet on uneven surfaces activity did not occur: Safety/medical concerns         Wheelchair     Assist Will patient use wheelchair at discharge?: Yes Type of Wheelchair: Manual    Wheelchair assist level: Supervision/Verbal cueing Max wheelchair distance: 150    Wheelchair 50 feet with 2 turns activity    Assist        Assist Level: Supervision/Verbal cueing   Wheelchair 150 feet activity     Assist      Assist Level: Supervision/Verbal cueing   Blood pressure 137/84, pulse (!) 106, temperature 98.7 F (37.1 C), temperature source Oral, resp. rate 18, height 5\' 5"  (1.651 m), weight 55.4 kg, SpO2 100 %.  Medical Problem List and Plan: 1.  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  No back brace required.             -patient may not shower             -ELOS/Goals: Supervision/Min A             CIR initial evaluations 2.  Antithrombotics: -DVT/anticoagulation: SCDs.  Dopplers ordered. Plan to discuss with neurosurgery question Lovenox             -antiplatelet therapy: N/A 3. Pain Management: Oxycodone as needed, Valium as needed muscle spasms             Monitor with increased exertion. Well controlled.  4. Mood: Provide emotional support             -antipsychotic agents: N/A 5. Neuropsych: This patient is capable of making decisions on her own behalf. 6. Skin/Wound Care: Routine skin checks 7. Fluids/Electrolytes/Nutrition: Routine in and outs.  CMP ordered. Has not yet resulted.  8.  Neurogenic bowel/bladder.  PVRs ordered.  Establish bowel program 9.  History of metastatic breast cancer.  Current plans to follow-up  outpatient radiation oncology Dr. Sondra Come as well as oncology service Dr. Lindi Adie to establish radiation therapy.  Patient's [facility] is currently. 10.  Acute blood loss anemia.  CBC ordered. Has not yet resulted.  11.  Leukocytosis: Likely steroid-induced.  CBC ordered. Has not yet resulted. 12. Incontinence: Misty Arnold was upset that she called to be taken to bathroom to urinate last night and fell asleep before anyone got to her, and that her dressing was soiled with urine this morning. She requests bedpan be placed by bedside in case she has future similar episode. Placed nursing order.     LOS: 1 days A FACE TO FACE EVALUATION WAS PERFORMED  Clide Deutscher Andriel Omalley 01/09/2020, 4:02 PM

## 2020-01-09 NOTE — Evaluation (Signed)
Physical Therapy Assessment and Plan  Patient Details  Name: Misty Arnold MRN: [id number] Date of Birth: [birth year]  PT Diagnosis: Ataxia, Ataxic gait, Impaired sensation and Muscle weakness Rehab Potential: Fair ELOS: 10 to 12 days   Today's Date: 01/09/2020 PT Individual Time: 1300-1410 PT Individual Time Calculation (min): 70 min    Problem List:  Patient Active Problem List   Diagnosis Date Noted  . Myelopathy ([place]) 01/08/2020  . Incomplete paraplegia ([place]) 01/08/2020  . Epidural mass   . Metastatic breast cancer ([place])   . Weakness of both legs   . Leucocytosis   . Acute blood loss anemia   . Neurogenic bladder   . Neurogenic bowel   . S/P laminectomy 01/04/2020  . 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 Breast cancer of upper-outer quadrant of right female breast ([place]) 11/02/2014    Past Medical History:  Past Medical History:  Diagnosis Date  . Abnormal uterine bleeding   . Anemia   . Breast cancer ([place])   . Depression   . Fibroid   . Hormone disorder   . Hypertension   . Infertility, female    Past Surgical History:  Past Surgical History:  Procedure Laterality Date  . ABDOMINAL HYSTERECTOMY    . BREAST SURGERY    . LAMINECTOMY N/A 01/04/2020   Procedure: Thoracic Eight, Thoracic Nine THORACIC LAMINECTOMY FOR TUMOR with Thoracic Seven-Thoracic Ten instrumentation;  Surgeon: [name], MD;  Location: [facility];  Service: Neurosurgery;  Laterality: N/A;  Thoracic Eight, Thoracic Nine THORACIC LAMINECTOMY FOR TUMOR with Thoracic Seven-Thoracic Ten instrumentation  . MASTECTOMY    . salivary gland stone      Assessment & Plan Clinical Impression: Patient is a 49 year old right-handed female with history of metastatic breast cancer diagnosed November 2011 with mastectomy 08/29/2015 followed by reconstruction and followed by Dr. Lindi Adie and had been maintained on Zoladex and letrozole and later changed to [facility].  History taken from chart review  and patient.  Patient had previously been recommended radiation therapy for palliation but she initially declined.  She presented on 01/04/2020 with increasing back pain as well as lower extremity weakness.  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  She underwent T7-8-9-T10 decompressive laminectomy with resection of tumor including right T9 transpedicular decompression with resection of tumor/segmental pedicle screw fixation on 01/04/2020 per Dr. Annette Stable.  No back brace required.  Pathology report consistent with metastatic poorly differentiated carcinoma.  Follow-up radiation oncology Dr. Dwana Curd and plan is for radiation therapy to begin in 2 to 3 weeks after recovery from recent back surgery.  Hospital course further complicated by acute blood loss anemia and leukocytosis.  Hemoglobin 9.2.  Leukocytosis 16,000 felt to be induced by Decadron protocol.  Patient is maintained on a regular diet.  Therapy evaluations completed and patient was admitted for a comprehensive rehab program.  Patient transferred to CIR on 01/08/2020 .   Patient currently requires mod with mobility secondary to muscle weakness, decreased cardiorespiratoy endurance and ataxia and decreased coordination.  Prior to hospitalization, patient was independent  with mobility and lived with Family in a House home.  Home access is 2(ramp to be installed)Stairs to enter.  Patient will benefit from skilled PT intervention to maximize safe functional mobility, minimize fall risk and decrease caregiver burden for planned discharge home with 24 hour supervision.  Anticipate patient will benefit from follow up [place] at discharge.  PT - End of Session Activity Tolerance: Tolerates  30+ min activity with multiple rests PT Assessment Rehab Potential (ACUTE/IP ONLY): Fair PT Barriers to Discharge: Inaccessible home environment PT Patient demonstrates impairments in the following area(s):  Balance;Endurance;Motor PT Transfers Functional Problem(s): Bed Mobility;Bed to Chair;Car PT Locomotion Functional Problem(s): Ambulation;Wheelchair Mobility;Stairs PT Plan PT Intensity: Minimum of 1-2 x/day ,45 to 90 minutes PT Frequency: 5 out of 7 days PT Duration Estimated Length of Stay: 10 to 12 days PT Treatment/Interventions: Ambulation/gait training;Balance/vestibular training;Community reintegration;DME/adaptive equipment instruction;Discharge planning;Functional mobility training;Neuromuscular re-education;Patient/family education;Stair training;UE/LE Coordination activities;UE/LE Strength taining/ROM;Splinting/orthotics;Therapeutic Activities;Therapeutic Exercise;Wheelchair propulsion/positioning;Visual/perceptual remediation/compensation PT Transfers Anticipated Outcome(s): mod I transfers PT Locomotion Anticipated Outcome(s): mod I gait, mod I w/c mobility, c/g stairs PT Recommendation Follow Up Recommendations: Home health PT Patient destination: Home Equipment Recommended: To be determined  Skilled Therapeutic Intervention Pt was seen bedside in the pm. PT evaluation completed and treatment plan initiated. As treatment progress and pt fatigued, required increased assistance with transfers and mobility. Pt is motivated to improve. Pt educated on PT plan of care and interventions. All questions answered. Pt left sitting up in w/c at end of treatment with OT at bedside.   PT Evaluation Precautions/Restrictions Precautions Precautions: Back Required Braces or Orthoses: Other Brace Other Brace: No brace needed per MD order Restrictions Weight Bearing Restrictions: No General Chart Reviewed: Yes Family/Caregiver Present: No  Pain Pain Assessment Pain Scale: 0-10 Pain Score: 5  Pain Type: Acute pain Pain Location: Scapula Pain Orientation: Left;Right Pain Descriptors / Indicators: Aching Pain Frequency: Intermittent Pain Onset: With Activity Patients Stated Pain Goal:  3 Pain Intervention(s): Medication (See eMAR) Home Living/Prior Functioning Home Living Available Help at Discharge: Family;Available 24 hours/day Type of Home: House Home Access: Stairs to enter [employer] of Steps: 2(ramp to be installed) Entrance Stairs-Rails: None Home Layout: One level Bathroom Shower/Tub: Walk-in shower Additional Comments: at discharge will move in with mother, pt will have 24 hour assistance available  Lives With: Family Prior Function Level of Independence: Independent with gait;Independent with transfers  Able to Take Stairs?: Yes Driving: Yes Vocation: On disability Vision/Perception  Perception Perception: Within Functional Limits  Cognition Overall Cognitive Status: Within Functional Limits for tasks assessed Arousal/Alertness: Awake/alert Orientation Level: Oriented X4 Memory: Appears intact Awareness: Appears intact Problem Solving: Appears intact Safety/Judgment: Appears intact Sensation Sensation Light Touch: Impaired Detail Light Touch Impaired Details: Impaired LLE;Impaired RLE Proprioception: Appears Intact;Impaired Detail Proprioception Impaired Details: Impaired RLE;Impaired LLE Additional Comments: impaired L greater than R Coordination Gross Motor Movements are Fluid and Coordinated: No Fine Motor Movements are Fluid and Coordinated: No Motor  Motor Motor: Ataxia  Mobility Bed Mobility Bed Mobility: Supine to Sit;Sit to Supine;Rolling Left Rolling Left: Moderate Assistance - Patient 50-74% Supine to Sit: Moderate Assistance - Patient 50-74% Sit to Supine: Moderate Assistance - Patient 50-74% Transfers Sit to Stand: Moderate Assistance - Patient 50-74% Stand to Sit: Moderate Assistance - Patient 50-74% Stand Pivot Transfers: Moderate Assistance - Patient 50 - 74% Transfer (Assistive device): None Locomotion  Gait Ambulation: Yes Gait Assistance: Moderate Assistance - Patient 50-74% Gait Distance (Feet): 40  Feet Assistive device: Rolling walker Stairs / Additional Locomotion Stairs: Yes Stairs Assistance: Moderate Assistance - Patient 50 - 74% Stair Management Technique: Two rails Number of Stairs: 1 Height of Stairs: 6 Wheelchair Mobility Wheelchair Mobility: Yes Wheelchair Assistance: Chartered loss adjuster: Both upper extremities Distance: 150  Trunk/Postural Assessment  Cervical Assessment Cervical Assessment: Within Functional Limits Thoracic Assessment Thoracic Assessment: Exceptions to [facility] Lumbar Assessment Lumbar Assessment: Exceptions to [facility] Postural Control  Postural Control: Deficits on evaluation  Balance Balance Balance Assessed: Yes Static Sitting Balance Static Sitting - Balance Support: Bilateral upper extremity supported;Feet supported Static Sitting - Level of Assistance: 5: Stand by assistance Static Standing Balance Static Standing - Level of Assistance: 4: Min assist Dynamic Standing Balance Dynamic Standing - Balance Support: During functional activity Dynamic Standing - Level of Assistance: 3: Mod assist;2: Max assist Extremity Assessment B UEs as per OT evaluation.   RLE Assessment Passive Range of Motion (PROM) Comments: WFLs General Strength Comments: strength grossly 2-/5 to 2/5 except DF 0/5 LLE Assessment LLE Assessment: Exceptions to [facility] Passive Range of Motion (PROM) Comments: WFLs General Strength Comments: grossly 2+/5 to 3-/5 except DF 1/5    Refer to Care Plan for Long Term Goals  Recommendations for other services: None   Discharge Criteria: Patient will be discharged from PT if patient refuses treatment 3 consecutive times without medical reason, if treatment goals not met, if there is a change in medical status, if patient makes no progress towards goals or if patient is discharged from hospital.  The above assessment, treatment plan, treatment alternatives and goals were discussed and mutually agreed upon:  by patient  Dub Amis 01/09/2020, 4:00 PM

## 2020-01-09 NOTE — Progress Notes (Signed)
Occupational Therapy Session Note  Patient Details  Name: Misty Arnold MRN: [id number] Date of Birth: [birth year]  Today's Date: 01/09/2020 OT Individual Time: [id number] OT Individual Time Calculation (min): 57 min    Short Term Goals: Week 1:  OT Short Term Goal 1 (Week 1): Pt will trasnfer to toilet wiht CGA consistently OT Short Term Goal 2 (Week 1): Pt will thread BLE into pants while maintaining BLT precations OT Short Term Goal 3 (Week 1): Pt will groom in standing and MIN A to demo improved endurance OT Short Term Goal 4 (Week 1): pt will transfer to shower wiht CGA  Skilled Therapeutic Interventions/Progress Updates:  1:1. Pt received in w/c with direct handoff from PT. Pt agreeable to OT with no pain. Pt c/o swelling in [place] and OT edu re compression (teds), elevation and movement as  Factors to improve edema. Pt requresting elevating leg rests and OT retrieves 16x16 w/c, ELR, and teds with pt reporting much improvement in comfort/steering in smaller w/c. OT edu re use of plastic bag to don teds, DME for shower- shower chair v TTB, and transfer methods. Pt undecided on which would be better for shower and wanting to wait to see LE return prior to deciding DME.  Pt completes 3x15 squats with rail for quad control/strenthening in prep for functional transfers with VC/demo of technique and facilitation of prevention of hyperextension on L knee. Pt completes toileting with MAX A for CM with R knee block. Exited session with pt seated in w/c, exit alarm on and call light inreach  Therapy Documentation Precautions:  Precautions Precautions: Back Precaution Booklet Issued: No Precaution Comments: Reviewed back precautions.  Required Braces or Orthoses: Other Brace Other Brace: No brace needed per MD order Restrictions Weight Bearing Restrictions: No General:   Vital Signs: Therapy Vitals Temp: 98.7 F (37.1 C) Temp Source: Oral Pulse Rate: (!) 106 Resp: 18 BP: 137/84 Patient  Position (if appropriate): Sitting Oxygen Therapy SpO2: 100 % O2 Device: Room Air Pain: Pain Assessment Pain Scale: 0-10 Pain Score: 5  Pain Type: Acute pain Pain Location: Scapula Pain Orientation: Left;Right Pain Descriptors / Indicators: Aching Pain Frequency: Intermittent Pain Onset: With Activity Patients Stated Pain Goal: 3 Pain Intervention(s): Medication (See eMAR) ADL: ADL Upper Body Bathing: Supervision/safety Where Assessed-Upper Body Bathing: Sitting at sink Lower Body Bathing: Moderate assistance(A for feet) Where Assessed-Lower Body Bathing: Sitting at sink Upper Body Dressing: Supervision/safety Where Assessed-Upper Body Dressing: Sitting at sink Lower Body Dressing: Moderate assistance Where Assessed-Lower Body Dressing: Sitting at sink, Standing at sink, Medical laboratory scientific officer: Minimal assistance Toilet Transfer Method: Stand pivot Vision Baseline Vision/History: No visual deficits Vision Assessment?: No apparent visual deficits Perception  Perception: Within Functional Limits Praxis Praxis: Intact Exercises:   Other Treatments:     Therapy/Group: Individual Therapy  Tonny Branch 01/09/2020, 3:13 PM

## 2020-01-09 NOTE — Evaluation (Signed)
Occupational Therapy Assessment and Plan  Patient Details  Name: Misty Arnold MRN: [id number] Date of Birth: May 20, 1971  OT Diagnosis: abnormal posture, acute pain, muscle weakness (generalized) and paraparesis at level T8-10 Rehab Potential: Rehab Potential (ACUTE ONLY): Good ELOS: 10-12   Today's Date: 01/09/2020 OT Individual Time: 4098-1191 OT Individual Time Calculation (min): 60 min     Problem List:  Patient Active Problem List   Diagnosis Date Noted  . Myelopathy ([place]) 01/08/2020  . Incomplete paraplegia ([place]) 01/08/2020  . Epidural mass   . Metastatic breast cancer ([place])   . Weakness of both legs   . Leucocytosis   . Acute blood loss anemia   . Neurogenic bladder   . Neurogenic bowel   . S/P laminectomy 01/04/2020  . 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 Breast cancer of upper-outer quadrant of right female breast ([place]) 11/02/2014    Past Medical History:  Past Medical History:  Diagnosis Date  . Abnormal uterine bleeding   . Anemia   . Breast cancer ([place])   . Depression   . Fibroid   . Hormone disorder   . Hypertension   . Infertility, female    Past Surgical History:  Past Surgical History:  Procedure Laterality Date  . ABDOMINAL HYSTERECTOMY    . BREAST SURGERY    . LAMINECTOMY N/A 01/04/2020   Procedure: Thoracic Eight, Thoracic Nine THORACIC LAMINECTOMY FOR TUMOR with Thoracic Seven-Thoracic Ten instrumentation;  Surgeon: [name], MD;  Location: [facility];  Service: Neurosurgery;  Laterality: N/A;  Thoracic Eight, Thoracic Nine THORACIC LAMINECTOMY FOR TUMOR with Thoracic Seven-Thoracic Ten instrumentation  . MASTECTOMY    . salivary gland stone      Assessment & Plan Clinical Impression:Misty Arnold is a 49 year old right-handed female with history of metastatic breast cancer diagnosed November 2011 with mastectomy 08/29/2015 followed by reconstruction and followed by Dr. Lindi Adie and had been maintained on Zoladex and  letrozole and later changed to [facility].  History taken from chart review and patient.  Patient had previously been recommended radiation therapy for palliation but she initially declined.  She presented on 01/04/2020 with increasing back pain as well as lower extremity weakness.  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  She underwent T7-8-9-T10 decompressive laminectomy with resection of tumor including right T9 transpedicular decompression with resection of tumor/segmental pedicle screw fixation on 01/04/2020 per Dr. Annette Stable.  No back brace required.  Pathology report consistent with metastatic poorly differentiated carcinoma.  Follow-up radiation oncology Dr. Dwana Curd and plan is for radiation therapy to begin in 2 to 3 weeks after recovery from recent back surgery.  Hospital course further complicated by acute blood loss anemia and leukocytosis.  Hemoglobin 9.2.  Leukocytosis 16,000 felt to be induced by Decadron protocol.  Patient is maintained on a regular diet.  Therapy evaluations completed and patient was admitted for a comprehensive rehab program.  Please see preadmission assessment from earlier today as well.  .    Patient currently requires mod with basic self-care skills secondary to muscle weakness, decreased cardiorespiratoy endurance, impaired timing and sequencing, unbalanced muscle activation and decreased coordination and decreased sitting balance, decreased standing balance, decreased postural control and decreased balance strategies.  Prior to hospitalization, patient could complete BADL/IADL with modified independent .  Patient will benefit from skilled intervention to decrease level of assist with basic self-care skills and increase independence with basic self-care skills prior to discharge home with care partner.  Anticipate  patient will require 24 hour supervision and follow up outpatient.  OT - End of Session Activity Tolerance:  Tolerates 30+ min activity with multiple rests OT Assessment Rehab Potential (ACUTE ONLY): Good OT Patient demonstrates impairments in the following area(s): Balance;Edema;Endurance;Motor;Pain;Safety;Skin Integrity OT Basic ADL's Functional Problem(s): Grooming;Bathing;Dressing;Toileting OT Transfers Functional Problem(s): Toilet;Tub/Shower OT Additional Impairment(s): None OT Plan OT Intensity: Minimum of 1-2 x/day, 45 to 90 minutes OT Frequency: 5 out of 7 days OT Duration/Estimated Length of Stay: 10-12 OT Treatment/Interventions: Balance/vestibular training;Discharge planning;Pain management;Self Care/advanced ADL retraining;Therapeutic Activities;UE/LE Coordination activities;Visual/perceptual remediation/compensation;Therapeutic Exercise;Skin care/wound managment;Patient/family education;Functional mobility training;Disease mangement/prevention;Community reintegration;DME/adaptive equipment instruction;Neuromuscular re-education;Psychosocial support;Splinting/orthotics;UE/LE Strength taining/ROM;Wheelchair propulsion/positioning OT Self Feeding Anticipated Outcome(s): no goal OT Basic Self-Care Anticipated Outcome(s): S OT Toileting Anticipated Outcome(s): S OT Bathroom Transfers Anticipated Outcome(s): S OT Recommendation Recommendations for Other Services: Neuropsych consult Patient destination: Home Follow Up Recommendations: Outpatient OT Equipment Recommended: 3 in 1 bedside comode;Tub/shower bench   Skilled Therapeutic Intervention 1:1. Pt received in bed agreeable to OT but distraught about toileting incident last night being left in the bed with urine. Pt given female urinal to keep by bed in case pt needs to use if staff unable to come in time. Pt agreeable to bathing/dressing this date. EOB>w/c>toilet transfer min-mod A for lifting/power up better with grab bar or rail and blocking RLE. Pt completes bathing with A to wash B feet. Once cued pt able to cross into figure 4 to  access feet. Pt dresses with MOD A LB sit to stand and S for UB. Grooming seated with S. Exited session with pt seated on toilet with NT present  OT Evaluation Precautions/Restrictions  Precautions Precautions: Back Precaution Booklet Issued: No Precaution Comments: Reviewed back precautions.  Required Braces or Orthoses: Other Brace Other Brace: No brace needed per MD order Restrictions Weight Bearing Restrictions: No General Chart Reviewed: Yes Family/Caregiver Present: No Vital Signs   Pain   Home Living/Prior Functioning Home Living Available Help at Discharge: Family, Available 24 hours/day Type of Home: House Home Access: Stairs to enter Technical brewer of Steps: 2 Home Layout: Two level, Bed/bath upstairs Alternate Level Stairs-Number of Steps: 1 flight Bathroom Shower/Tub: Multimedia programmer: Standard IADL History Homemaking Responsibilities: Yes Meal Prep Responsibility: Primary Laundry Responsibility: Primary Cleaning Responsibility: Primary Bill Paying/Finance Responsibility: Primary Shopping Responsibility: Primary Current License: Yes Type of Occupation: love cooking and crafting being outside Prior Function Level of Independence: Independent with basic ADLs, Independent with homemaking with ambulation Driving: Yes ADL ADL Upper Body Bathing: Supervision/safety Where Assessed-Upper Body Bathing: Sitting at sink Lower Body Bathing: Moderate assistance(A for feet) Where Assessed-Lower Body Bathing: Sitting at sink Upper Body Dressing: Supervision/safety Where Assessed-Upper Body Dressing: Sitting at sink Lower Body Dressing: Moderate assistance Where Assessed-Lower Body Dressing: Sitting at sink, Standing at sink, Medical laboratory scientific officer: Minimal assistance Toilet Transfer Method: Stand pivot Vision Baseline Vision/History: No visual deficits Vision Assessment?: No apparent visual deficits Perception  Perception: Within  Functional Limits Praxis Praxis: Intact Cognition Overall Cognitive Status: Within Functional Limits for tasks assessed Arousal/Alertness: Awake/alert Orientation Level: Person;Place;Situation Person: Oriented Place: Oriented Year: 2021 Month: March Day of Week: Correct Memory: Appears intact Immediate Memory Recall: Sock;Bed;Blue Memory Recall Sock: Without Cue Memory Recall Blue: Without Cue Awareness: Appears intact Problem Solving: Appears intact Safety/Judgment: Appears intact Sensation Sensation Light Touch: Impaired Detail Peripheral sensation comments: trunk decreased light touch Light Touch Impaired Details: Impaired RLE;Impaired LLE Hot/Cold: Impaired Detail Hot/Cold Impaired Details: Impaired LLE;Impaired RLE Coordination Gross Motor Movements are Fluid  and Coordinated: No Fine Motor Movements are Fluid and Coordinated: No Coordination and Movement Description: LE decreased coordination/strength; UEs WFL Motor  Motor Motor: Paraplegia Mobility  Transfers Sit to Stand: Minimal Assistance - Patient > 75% Stand to Sit: Minimal Assistance - Patient > 75%  Trunk/Postural Assessment  Cervical Assessment Cervical Assessment: Within Functional Limits Thoracic Assessment Thoracic Assessment: (back precautions) Lumbar Assessment Lumbar Assessment: (back precautions) Postural Control Postural Control: Deficits on evaluation(delayed/insufficient)  Balance Balance Balance Assessed: Yes Dynamic Sitting Balance Dynamic Sitting - Level of Assistance: 5: Stand by assistance;4: Min assist Static Standing Balance Static Standing - Level of Assistance: 4: Min assist Static Standing - Comment/# of Minutes: standing Extremity/Trunk Assessment RUE Assessment RUE Assessment: Within Functional Limits LUE Assessment LUE Assessment: Within Functional Limits     Refer to Care Plan for Long Term Goals  Recommendations for other services: Neuropsych and Therapeutic  Recreation  Pet therapy and Stress management   Discharge Criteria: Patient will be discharged from OT if patient refuses treatment 3 consecutive times without medical reason, if treatment goals not met, if there is a change in medical status, if patient makes no progress towards goals or if patient is discharged from hospital.  The above assessment, treatment plan, treatment alternatives and goals were discussed and mutually agreed upon: by patient  Tonny Branch 01/09/2020, 12:31 PM

## 2020-01-10 ENCOUNTER — Inpatient Hospital Stay (HOSPITAL_COMMUNITY): Payer: Medicare PPO

## 2020-01-10 MED ORDER — ACETAMINOPHEN 325 MG PO TABS
650.0000 mg | ORAL_TABLET | Freq: Three times a day (TID) | ORAL | Status: DC
  Administered 2020-01-10 – 2020-01-20 (×30): 650 mg via ORAL
  Filled 2020-01-10 (×31): qty 2

## 2020-01-10 MED ORDER — ACETAMINOPHEN 650 MG RE SUPP: 650.0000 mg | Freq: Three times a day (TID) | RECTAL | Status: DC

## 2020-01-10 NOTE — Progress Notes (Signed)
+/-   sleep. Gown changed at least 2 times, because of night sweats. Patient reports "night sweats after drain in my back was removed." PRN Oxy IR 10mg 's given at 2310 & 0547 for C/O back pain. C/O discomfort at lateral aspect of right chest incision. Dressing changed this AM. Reports getting some feeling back in right foot. SCD's at [facility], wore until 0545.[name] A

## 2020-01-10 NOTE — Progress Notes (Signed)
Misty Arnold PHYSICAL MEDICINE & REHABILITATION PROGRESS NOTE   Subjective/Complaints: Had night sweats overnight. Afebrile.  Received oxycodone for back pain twice. Continues to have pain, but is trying to take less oxycodone.  Slept well otherwise.   Objective:   No results found. No results for input(s): WBC, HGB, HCT, PLT in the last 72 hours. No results for input(s): NA, K, CL, CO2, GLUCOSE, BUN, CREATININE, CALCIUM in the last 72 hours.  Intake/Output Summary (Last 24 hours) at 01/10/2020 1302 Last data filed at 01/10/2020 0830 Gross per 24 hour  Intake 970 ml  Output --  Net 970 ml     Physical Exam: Vital Signs Blood pressure 128/84, pulse 81, temperature 98.4 F (36.9 C), temperature source Oral, resp. rate 18, height 5\' 5"  (1.651 m), weight 55.4 kg, SpO2 100 %. Vitals reviewed. Constitutional: She appears well-developed. Curled in ball in bed.   Frail  HENT:  Head: Normocephalic and atraumatic.  Eyes: EOM are normal. Right eye exhibits no discharge. Left eye exhibits no discharge.  Neck: No tracheal deviation present. No thyromegaly present.  Respiratory: Effort normal. No respiratory distress.  GI: She exhibits no distension.  Musculoskeletal:     Comments: No edema or tenderness in extremities  Neurological: She is alert.  Follows commands oriented x3. Motor: Bilateral upper extremities: 4-4+/5 proximal distal Right lower extremity: Flexion, extension 4 medicine/5, ankle dorsiflexion 2/5 Left lower extremity: Flexion, knee extension 4+/5, ankle dorsiflexion 4/5 Sensation diminished to light touch right foot  Skin:  Dressing C/D/I  Psychiatric: She has a normal mood and affect. Her behavior is normal. Thought content normal.     Assessment/Plan: 1. 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  Physiatrist is providing close team supervision and 24 hour management of active medical problems listed below.  Physiatrist and rehab team continue to assess barriers to discharge/monitor patient progress toward functional and medical goals  Care Tool:  Bathing    Body parts bathed by patient: Right arm, Left arm, Chest, Abdomen, Front perineal area, Buttocks, Right upper leg, Left upper leg, Face   Body parts bathed by helper: Right lower leg, Left lower leg     Bathing assist Assist Level: Minimal Assistance - Patient > 75%     Upper Body Dressing/Undressing Upper body dressing   What is the patient wearing?: Hospital gown only    Upper body assist Assist Level: Independent    Lower Body Dressing/Undressing Lower body dressing      What is the patient wearing?: Pants     Lower body assist Assist for lower body dressing: Moderate Assistance - Patient 50 - 74%     Toileting Toileting    Toileting assist Assist for toileting: Moderate Assistance - Patient 50 - 74%     Transfers Chair/bed transfer  Transfers assist     Chair/bed transfer assist level: Moderate Assistance - Patient 50 - 74%     Locomotion Ambulation   Ambulation assist      Assist level: Moderate Assistance - Patient 50 - 74% Assistive device: Walker-rolling Max distance: 40   Walk 10 feet activity   Assist     Assist level: Moderate Assistance - Patient - 50 - 74% Assistive device: Walker-rolling   Walk  50 feet activity   Assist Walk 50 feet with 2 turns activity did not occur: Safety/medical concerns         Walk 150 feet activity   Assist Walk 150 feet activity did not occur: Safety/medical concerns         Walk 10 feet on uneven surface   activity   Assist Walk 10 feet on uneven surfaces activity did not occur: Safety/medical concerns         Wheelchair     Assist Will patient use wheelchair at discharge?: Yes Type of Wheelchair: Manual    Wheelchair assist level: Supervision/Verbal cueing Max wheelchair distance: 150    Wheelchair 50 feet with 2 turns activity    Assist        Assist Level: Supervision/Verbal cueing   Wheelchair 150 feet activity     Assist      Assist Level: Supervision/Verbal cueing   Blood pressure 128/84, pulse 81, temperature 98.4 F (36.9 C), temperature source Oral, resp. rate 18, height 5\' 5"  (1.651 m), weight 55.4 kg, SpO2 100 %.  Medical Problem List and Plan: 1.  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  No back brace required.             -patient may not shower             -ELOS/Goals: Supervision/Min A             Continue CIR PT, OT 2.  Antithrombotics: -DVT/anticoagulation: SCDs.  Dopplers ordered. Plan to discuss with neurosurgery question Lovenox             -antiplatelet therapy: N/A 3. Pain Management: Oxycodone as needed, Valium as needed muscle spasms             Monitor with increased exertion. Well controlled.   3/7: continues to have pain but trying to rely less on oxycodone. Discussed scheduling Tylenol and she is agreeable.  4. Mood: Provide emotional support             -antipsychotic agents: N/A 5. Neuropsych: This patient is capable of making decisions on her own behalf. 6. Skin/Wound Care: Routine skin checks 7. Fluids/Electrolytes/Nutrition: Routine in and outs.  CMP ordered for tomorrow.  8.  Neurogenic bowel/bladder.  PVRs ordered.  Establish bowel program 9.  History of metastatic breast cancer.   Current plans to follow-up outpatient radiation oncology Dr. Sondra Come as well as oncology service Dr. Lindi Adie to establish radiation therapy.  Patient's [facility] is currently. 10.  Acute blood loss anemia.  CBC ordered. Has not yet resulted.  11.  Leukocytosis: Likely steroid-induced.  CBC ordered for tomorrow.  12. Incontinence: Mrs. Amling was upset that she called to be taken to bathroom to urinate last night and fell asleep before anyone got to her, and that her dressing was soiled with urine this morning. She requests bedpan be placed by bedside in case she has future similar episode. Placed nursing order.   3/7: no similar issues overnight    LOS: 2 days A FACE TO FACE EVALUATION WAS PERFORMED  Tyshia Fenter P Tashica Provencio 01/10/2020, 1:02 PM

## 2020-01-11 ENCOUNTER — Inpatient Hospital Stay (HOSPITAL_COMMUNITY): Payer: Medicare PPO | Admitting: Occupational Therapy

## 2020-01-11 ENCOUNTER — Inpatient Hospital Stay (HOSPITAL_COMMUNITY): Payer: Medicare PPO | Admitting: Physical Therapy

## 2020-01-11 ENCOUNTER — Inpatient Hospital Stay (HOSPITAL_COMMUNITY): Payer: Medicare PPO

## 2020-01-11 ENCOUNTER — Encounter (HOSPITAL_COMMUNITY): Payer: Self-pay | Admitting: Physical Medicine and Rehabilitation

## 2020-01-11 LAB — COMPREHENSIVE METABOLIC PANEL
ALT: 67 U/L — ABNORMAL HIGH (ref 0–44)
AST: 32 U/L (ref 15–41)
Albumin: 2.5 g/dL — ABNORMAL LOW (ref 3.5–5.0)
Alkaline Phosphatase: 67 U/L (ref 38–126)
Anion gap: 5 (ref 5–15)
BUN: 15 mg/dL (ref 6–20)
CO2: 29 mmol/L (ref 22–32)
Calcium: 8.6 mg/dL — ABNORMAL LOW (ref 8.9–10.3)
Chloride: 104 mmol/L (ref 98–111)
Creatinine, Ser: 0.57 mg/dL (ref 0.44–1.00)
GFR calc Af Amer: >60 mL/min (ref >60)
GFR calc non Af Amer: >60 mL/min (ref >60)
Glucose, Bld: 151 mg/dL — ABNORMAL HIGH (ref 70–99)
Potassium: 4.1 mmol/L (ref 3.5–5.1)
Sodium: 138 mmol/L (ref 135–145)
Total Bilirubin: 0.4 mg/dL (ref 0.3–1.2)
Total Protein: 4.9 g/dL — ABNORMAL LOW (ref 6.5–8.1)

## 2020-01-11 LAB — CBC WITH DIFFERENTIAL/PLATELET
Abs Immature Granulocytes: 0.66 10*3/uL — ABNORMAL HIGH (ref 0.00–0.07)
Basophils Absolute: 0 10*3/uL (ref 0.0–0.1)
Basophils Relative: 0 %
Eosinophils Absolute: 0 10*3/uL (ref 0.0–0.5)
Eosinophils Relative: 0 %
HCT: 26.8 % — ABNORMAL LOW (ref 36.0–46.0)
Hemoglobin: 8.6 g/dL — ABNORMAL LOW (ref 12.0–15.0)
Immature Granulocytes: 4 %
Lymphocytes Relative: 8 %
Lymphs Abs: 1.6 10*3/uL (ref 0.7–4.0)
MCH: 30.2 pg (ref 26.0–34.0)
MCHC: 32.1 g/dL (ref 30.0–36.0)
MCV: 94 fL (ref 80.0–100.0)
Monocytes Absolute: 0.8 10*3/uL (ref 0.1–1.0)
Monocytes Relative: 4 %
Neutro Abs: 16 10*3/uL — ABNORMAL HIGH (ref 1.7–7.7)
Neutrophils Relative %: 84 %
Platelets: 486 10*3/uL — ABNORMAL HIGH (ref 150–400)
RBC: 2.85 MIL/uL — ABNORMAL LOW (ref 3.87–5.11)
RDW: 14.6 % (ref 11.5–15.5)
WBC: 19.1 10*3/uL — ABNORMAL HIGH (ref 4.0–10.5)
nRBC: 1.7 % — ABNORMAL HIGH (ref 0.0–0.2)

## 2020-01-11 NOTE — Progress Notes (Signed)
Inpatient Rehabilitation  Patient information reviewed and entered into eRehab system by Anshika Pethtel M. Ritta Hammes, M.A., CCC/SLP, PPS Coordinator.  Information including medical coding, functional ability and quality indicators will be reviewed and updated through discharge.    

## 2020-01-11 NOTE — Progress Notes (Signed)
Occupational Therapy Session Note  Patient Details  Name: Misty Arnold MRN: [id number] Date of Birth: 1971/10/13  Today's Date: 01/11/2020 OT Individual Time: 1400-1430 OT Individual Time Calculation (min): 30 min    Short Term Goals: Week 1:  OT Short Term Goal 1 (Week 1): Pt will trasnfer to toilet wiht CGA consistently OT Short Term Goal 2 (Week 1): Pt will thread BLE into pants while maintaining BLT precations OT Short Term Goal 3 (Week 1): Pt will groom in standing and MIN A to demo improved endurance OT Short Term Goal 4 (Week 1): pt will transfer to shower wiht CGA  Skilled Therapeutic Interventions/Progress Updates:    Pt resting in w/c upon arrival.  OT intervention with focus on simulated shower transfer, bed mobility, and safety awareness to increase increase independence with BADLs. Pt amb with RW in ADL apartment to access simulated walk-in shower tranfser with min A.  Sit<>stand from shower seat with min A.  Pt amb with RW to EOB and performed sit>supine with min A for BLE management.  Supine>sit EOB with supervision.  Pt returned to w/c and returned to room.  Pt remained seated in w/c with all needs within reach.   Therapy Documentation Precautions:  Precautions Precautions: Back Precaution Booklet Issued: No Precaution Comments: Reviewed back precautions.  Required Braces or Orthoses: Other Brace Other Brace: No brace needed per MD order Restrictions Weight Bearing Restrictions: No   Pain:  Pt denies pain this afternoon   Therapy/Group: Individual Therapy  Leroy Libman 01/11/2020, 2:34 PM

## 2020-01-11 NOTE — Progress Notes (Signed)
[facility] PHYSICAL MEDICINE & REHABILITATION PROGRESS NOTE   Subjective/Complaints:  Pt reports back incision is sore- ~ 7/10 after therapy- didn't take anything but tylenol prior to therapy today.   Explained she doesn't want tramadol due to severe constipation from 4 tabs total - took a long time to resolve. Feels bloated by gas and bowels and fluid "logged"  But urinating well/voiding and thinks emptying. Admits need to have BM.   On dexamethasone.  Likely the cause for intermittent sweating.   ROS: pt denies SOB, CP, N/V/D- does have constipation  Objective:   No results found. Recent Labs    01/11/20 0521  WBC 19.1*  HGB 8.6*  HCT 26.8*  PLT 486*   Recent Labs    01/11/20 0521  NA 138  K 4.1  CL 104  CO2 29  GLUCOSE 151*  BUN 15  CREATININE 0.57  CALCIUM 8.6*    Intake/Output Summary (Last 24 hours) at 01/11/2020 1259 Last data filed at 01/11/2020 0818 Gross per 24 hour  Intake 910 ml  Output --  Net 910 ml     Physical Exam: Vital Signs Blood pressure 138/90, pulse 83, temperature 98.5 F (36.9 C), temperature source Oral, resp. rate 18, height 5\' 5"  (1.651 m), weight 55.4 kg, SpO2 100 %. Labs and vitals reviewed General: awake, alert, sitting upright in manual w/c, PT in room, NAD   Appears very cachetic/frail HENT:  Head: Normocephalic and atraumatic.  Eyes: conjugate gaze Neck: supple CV: RRR Respiratory: CTA B/L- decreased breath sounds at bases B/L GI: abd bloated, distended compared to rest of her body-  Slightly TTP; (+)BS hypoactive Musculoskeletal:     Comments: No edema or tenderness in extremities  Neurological: She is alert.  Follows commands oriented x3. Motor: Bilateral upper extremities: 4-4+/5 proximal distal Right lower extremity: Flexion, extension 4 medicine/5, ankle dorsiflexion 2/5 Left lower extremity: Flexion, knee extension 4+/5, ankle dorsiflexion 4/5 Sensation diminished to light touch right foot  Skin: fungating masses  on R breast area- all over R chest and R lateral aspect of chest- covered with some slough on tops of masses- Not tender to palpation; nothing bleeding- covered with vaseline gauze and kerlex.  back incision covered with honeycomb dressing- no signs of bleeding/drainage.  Psychiatric: calm  But slightly anxious underneath   Assessment/Plan: 1. 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  Physiatrist is providing close team supervision and 24 hour management of active medical problems listed below.  Physiatrist and rehab team continue to assess barriers to discharge/monitor patient progress toward functional and medical goals  Care Tool:  Bathing    Body parts bathed by patient: Right arm, Left arm, Chest, Abdomen, Front perineal area, Buttocks, Right upper leg, Left upper leg, Face, Right lower leg, Left lower leg   Body parts bathed by helper: Right lower leg, Left lower leg     Bathing assist Assist Level: Contact Guard/Touching assist     Upper Body Dressing/Undressing Upper body dressing   What is the patient wearing?: Pull over shirt    Upper body assist Assist Level: Set up assist    Lower Body Dressing/Undressing Lower body dressing      What is the patient wearing?: Pants  Lower body assist Assist for lower body dressing: Contact Guard/Touching assist     Toileting Toileting    Toileting assist Assist for toileting: Contact Guard/Touching assist     Transfers Chair/bed transfer  Transfers assist     Chair/bed transfer assist level: Minimal Assistance - Patient > 75%      Locomotion Ambulation   Ambulation assist      Assist level: Contact Guard/Touching assist Assistive device: Walker-rolling Max distance: 40'   Walk 10 feet activity   Assist     Assist level: Contact Guard/Touching assist Assistive device: Walker-rolling   Walk 50 feet activity   Assist Walk 50 feet with 2 turns activity did not occur: Safety/medical concerns         Walk 150 feet activity   Assist Walk 150 feet activity did not occur: Safety/medical concerns         Walk 10 feet on uneven surface  activity   Assist Walk 10 feet on uneven surfaces activity did not occur: Safety/medical concerns         Wheelchair     Assist Will patient use wheelchair at discharge?: Yes Type of Wheelchair: Manual    Wheelchair assist level: Supervision/Verbal cueing Max wheelchair distance: 150    Wheelchair 50 feet with 2 turns activity    Assist        Assist Level: Supervision/Verbal cueing   Wheelchair 150 feet activity     Assist      Assist Level: Supervision/Verbal cueing   Blood pressure 138/90, pulse 83, temperature 98.5 F (36.9 C), temperature source Oral, resp. rate 18, height 5\' 5"  (1.651 m), weight 55.4 kg, SpO2 100 %.  Medical Problem List and Plan: 1.  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  No back brace required.             -patient may not shower             -ELOS/Goals: Supervision/Min A             Continue CIR PT, OT 2.  Antithrombotics: -DVT/anticoagulation: SCDs.  Dopplers ordered. Plan to discuss with neurosurgery question Lovenox 3/8- will need Lovenox as soon as OK by NSU due to cancer and SCI dx.              -antiplatelet therapy: N/A 3. Pain Management: Oxycodone as  needed, Valium as needed muscle spasms             Monitor with increased exertion. Well controlled.   3/7: continues to have pain but trying to rely less on oxycodone. Discussed scheduling Tylenol and she is agreeable.  3/8- pt trying to use tylenol as much as possible.   4. Mood: Provide emotional support             -antipsychotic agents: N/A 5. Neuropsych: This patient is capable of making decisions on her own behalf. 6. Skin/Wound Care: Routine skin checks 7. Fluids/Electrolytes/Nutrition: Routine in and outs.  CMP ordered for tomorrow.  8.  Neurogenic bowel/bladder.  PVRs ordered.  Establish bowel program  3/8- refused any bowel meds except miralax- insists voiding OK.  9.  History of metastatic breast cancer.  Current plans to follow-up outpatient radiation oncology Dr. Sondra Come as well as oncology service Dr. Lindi Adie to establish radiation therapy.  Patient's [facility] is currently. 10.  Acute  blood loss anemia.  CBC ordered. Has not yet resulted.  3/8- Hb down to 8.6- was 9.0- will monitor  11.  Leukocytosis: Likely steroid-induced.  CBC ordered for tomorrow.   3/8- pt's WBC going up 19k- afebrile- denies Sx's of infection except sweating which can come from steroids- will monitor 12. Incontinence: Mrs. Cresswell was upset that she called to be taken to bathroom to urinate last night and fell asleep before anyone got to her, and that her dressing was soiled with urine this morning. She requests bedpan be placed by bedside in case she has future similar episode. Placed nursing order.   3/7: no similar issues overnight  3/8- concerned that incontinence can be from SCI- will need to do ASIA exam tomorrow. 13. Constipation  3/8- pt only willing to try miralax- encouraged to take today. Will see when goes/if goes.    LOS: 3 days A FACE TO FACE EVALUATION WAS PERFORMED  Misty Arnold 01/11/2020, 12:59 PM

## 2020-01-11 NOTE — Progress Notes (Signed)
Social Work Assessment and Plan   Patient Details  Name: Misty Arnold MRN: [id number] Date of Birth: 05/13/1971  Today's Date: 01/11/2020  Problem List:  Patient Active Problem List   Diagnosis Date Noted  . Myelopathy ([place]) 01/08/2020  . Incomplete paraplegia ([place]) 01/08/2020  . Epidural mass   . Metastatic breast cancer ([place])   . Weakness of both legs   . Leucocytosis   . Acute blood loss anemia   . Neurogenic bladder   . Neurogenic bowel   . S/P laminectomy 01/04/2020  . 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 Breast cancer of upper-outer quadrant of right female breast ([place]) 11/02/2014   Past Medical History:  Past Medical History:  Diagnosis Date  . Abnormal uterine bleeding   . Anemia   . Breast cancer ([place])   . Depression   . Fibroid   . Hormone disorder   . Hypertension   . Infertility, female    Past Surgical History:  Past Surgical History:  Procedure Laterality Date  . ABDOMINAL HYSTERECTOMY    . BREAST SURGERY    . LAMINECTOMY N/A 01/04/2020   Procedure: Thoracic Eight, Thoracic Nine THORACIC LAMINECTOMY FOR TUMOR with Thoracic Seven-Thoracic Ten instrumentation;  Surgeon: [name], MD;  Location: [facility];  Service: Neurosurgery;  Laterality: N/A;  Thoracic Eight, Thoracic Nine THORACIC LAMINECTOMY FOR TUMOR with Thoracic Seven-Thoracic Ten instrumentation  . MASTECTOMY    . salivary gland stone     Social History:  reports that she has never smoked. She has never used smokeless tobacco. She reports that she does not drink alcohol or use drugs.  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, research (life sciences) entry rep until 2018 as health began to decline Education: high school graduate Read: Yes Write: Yes Employment Status: Disabled Date Retired/Disabled/Unemployed: 2018 Public relations account executive Issues: Denies Guardian/Conservator: Denies   Abuse/Neglect Abuse/Neglect Assessment Can Be Completed: Yes Physical Abuse: Denies Verbal Abuse: Denies Sexual Abuse: Denies Exploitation of patient/patient's resources: Denies Self-Neglect: Denies  Emotional Status Pt's affect, behavior and adjustment status: Pt appears to be adjusting to medical condition Recent Psychosocial Issues: Pt reports normal depression due to breast cancer but has dealt with things well since she has a very good support system. Psychiatric History: Denies Substance Abuse History: Denies  Patient / Family Perceptions, Expectations & Goals Pt/Family understanding of illness & functional limitations: Pt has a general understanding of her medical condition Premorbid pt/family roles/activities: Pt was independent Anticipated changes in roles/activities/participation: assistance wtih ADLs/IADLs Pt/family expectations/goals: To be as independent as possible  Recruitment consultant: None Premorbid Home Care/DME Agencies: None Transportation available at discharge: TBD which family member will transport home  Discharge Planning Living Arrangements: Other relatives Support Systems: Spouse/significant other, Other relatives, Parent Type of Residence: Private residence Insurance Resources: Multimedia programmer (specify)([employer] Medicare) Financial Resources: SSD Financial Screen Referred: No Does the patient have any problems obtaining your medications?: No Social Work Anticipated Follow Up Needs: HH/OP Expected length of stay: 10-12 days  Clinical Impression SW  met with pt in room to introduce self, explain role, and discuss discharge process. Pt reports she  will nto d/c to her mother's home but a friends' home. Mailing Address: [address], [place] [address]. Pt states her mother, s/o and other family will be able to assist pt at the house since it is a one level home. Pt will provide SW with address once she gets it. Pt states her oncologist Dr. Lindi Arnold she utilizes as her PCP. Pt states her HCPOA is s/o Misty Arnold.   [name] 01/11/2020, 4:34 PM

## 2020-01-11 NOTE — Progress Notes (Signed)
Physical Therapy Session Note  Patient Details  Name: Misty Arnold MRN: [id number] Date of Birth: February 06, 1971  Today's Date: 01/11/2020 PT Individual Time: [id number] PT Individual Time Calculation (min): 83 min   Short Term Goals: Week 1:  PT Short Term Goal 1 (Week 1): Pt will increase bed mobility to min A. PT Short Term Goal 2 (Week 1): Pt will increase transfers to c/g. PT Short Term Goal 3 (Week 1): Pt will propel w/c about 150 feet with mod I. PT Short Term Goal 4 (Week 1): Pt will increase ambulation with LRAD about 100 feet with min A. PT Short Term Goal 5 (Week 1): Pt will ascend/descend 4 stairs with B rails and min A.  Skilled Therapeutic Interventions/Progress Updates:    Pt received seated in w/c in room, agreeable to PT session. No complaints of pain this AM. Pt reports pain is well-controlled with use of medication at this time. Dependent transport via w/c to therapy gym for time conservation. Assisted pt with donning BLE TED hose, tennis shoes, and trial R AFO. Ambulation 2 x 35 ft with RW and min A with use of R AFO for foot drop. Pt exhibits improved RLE clearance and safety with gait with use of R AFO. Pt does continue to exhibit ataxic BLE, narrow BOS, and scissoring of BLE during gait. Standing BLE NMR with use of RW for balance and mirror for visual feedback: alt L/R target taps with focus on BLE control, alt L/R 1" step taps with focus on hip strengthening and BLE coordination. Pt exhibits R hip circumduction during step taps due to hip flexor weakness. Pt also tends to stand with B knees flexed and trunk flexed. Attempt to have pt perform mini-squats while standing with RW, pt demos fair ability to "unlock" knees and exhibits decreased B quad control. Pt also reports having some difficulty gripping RW due to sweaty hands, provided coban wrap to B RW handles and pt reports improved grip. Manual w/c propulsion x 150 ft with use of BUE at Supervision level for global endurance  training. Pt reports urge to urinate. Stand pivot transfer w/c to toilet with use of grab bar. Pt is setup A for pericare, min A for toilet transfer. Pt requests to return to bed at end of session due to fatigue. Stand pivot transfer w/c to bed with RW and min A. Sit to supine mod A for BLE management. Pt left semi-reclined in bed with needs in reach at end of session.  Therapy Documentation Precautions:  Precautions Precautions: Back Precaution Booklet Issued: No Precaution Comments: Reviewed back precautions.  Required Braces or Orthoses: Other Brace Other Brace: No brace needed per MD order Restrictions Weight Bearing Restrictions: No    Therapy/Group: Individual Therapy   Excell Seltzer, PT, DPT  01/11/2020, 3:46 PM

## 2020-01-11 NOTE — Progress Notes (Signed)
Physical Therapy Session Note  Patient Details  Name: Misty Arnold MRN: [id number] Date of Birth: [birth year]  Today's Date: 01/11/2020 PT Individual Time: [id number] PT Individual Time Calculation (min): 55 min   Short Term Goals: Week 1:  PT Short Term Goal 1 (Week 1): Pt will increase bed mobility to min A. PT Short Term Goal 2 (Week 1): Pt will increase transfers to c/g. PT Short Term Goal 3 (Week 1): Pt will propel w/c about 150 feet with mod I. PT Short Term Goal 4 (Week 1): Pt will increase ambulation with LRAD about 100 feet with min A. PT Short Term Goal 5 (Week 1): Pt will ascend/descend 4 stairs with B rails and min A.  Skilled Therapeutic Interventions/Progress Updates:   Pt in supine and agreeable to therapy, pain as detailed below. Supervision bed mobility. Sit<>stand and stand pivot to w/c w/ RW and CGA-min assist. Pt requesting to toilet. Stand pivot to/from toilet w/ min assist. Pt independent w/ pericare. Set-up assist to wash hands and brush teeth at sink from seated level. Total assist w/c transport to/from therapy gym. NuStep 10 min @ level 2 w/ all extremities to work on functional endurance and BLE strengthening. Tactile and verbal cues for neutral BLE alignment. Ambulated 77' and 20' w/ CGA and close w/c follow for safely. Pt w/ foot drop bilaterally R>L and knee hyperextension bilaterally R>L. Both impairments increase w/ fatigue to the point where further gait would be unsafe. Performed seated therex including knee marches, hamstring curls, and heel raises. 20 reps of each on each side. Added on beige theraband for hamstring curls w/ increased hamstring activation. Discussed performing these exercises outside of therapy to work on muscle activation and to decrease LE stiffness. Provided w/ written reminder in room as well. Returned to room and ended session in supine, all needs in reach.   Therapy Documentation Precautions:  Precautions Precautions: Back Precaution  Booklet Issued: No Precaution Comments: Reviewed back precautions.  Required Braces or Orthoses: Other Brace Other Brace: No brace needed per MD order Restrictions Weight Bearing Restrictions: No Pain: Pain Assessment Pain Scale: 0-10 Pain Score: 6  Pain Type: Acute pain;Surgical pain Pain Location: Back Pain Orientation: Mid;Upper Pain Descriptors / Indicators: Aching Pain Frequency: Intermittent Pain Onset: On-going Pain Intervention(s): Medication (See eMAR)  Therapy/Group: Individual Therapy  Jakerra Floyd Clent Demark 01/11/2020, 8:42 AM

## 2020-01-11 NOTE — Progress Notes (Signed)
1 episode of night sweats last night, requiring gown to be changed. Dressing changed around 1730. Patient doesn't want dressing changed this Am, "doctors will look at it this morning."  Scheduled tylenol given at 1939. PRN oxy IR given at 2358, complains of pain to back. Honeycomb dressing in place to back incision. Reports legs feeling weaker, past couple of days. And reports her speech is weaker and slurred at times, mostly after talking on phone for a long period of time. Used BSC this shift R/T urgency. Moderate continent BM last night. [name] A

## 2020-01-11 NOTE — IPOC Note (Signed)
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)   Leucocytosis   Acute blood loss anemia   Neurogenic bladder   Neurogenic bowel   Myelopathy ([facility])     Functional Problem List: Nursing Bladder, Bowel, Edema, Endurance, Pain, Safety, Nutrition, Motor, Medication Management, Skin Integrity, Sensory  PT Balance, Endurance, Motor  OT Balance, Edema, Endurance, Motor, Pain, Safety, Skin Integrity  SLP    TR         Basic ADL's: OT Grooming, Bathing, Dressing, Toileting     Advanced  ADL's: OT       Transfers: PT Bed Mobility, Bed to Chair, Teacher, early years/pre, Tub/Shower     Locomotion: PT Ambulation, Emergency planning/management officer, Stairs     Additional Impairments: OT None  SLP        TR      Anticipated Outcomes Item Anticipated Outcome  Self Feeding no goal  Swallowing      Basic self-care  S  Toileting  S   Bathroom Transfers S  Bowel/Bladder  manage bowel and bladder with Mod I assist  Transfers  mod I transfers  Locomotion  mod I gait, mod I w/c mobility, c/g stairs  Communication     Cognition     Pain  Pain level less than 4 on scale of 0-10  Safety/Judgment  remain free of injury, prevent falls with cues and remiders   Therapy Plan: PT Intensity: Minimum of 1-2 x/day ,45 to 90 minutes PT Frequency: 5 out of 7 days PT Duration Estimated Length of Stay: 10 to 12 days OT Intensity: Minimum of 1-2 x/day, 45 to 90 minutes OT Frequency: 5 out of 7 days OT Duration/Estimated Length of Stay: 10-12     Due to the current state of emergency, patients may not be receiving their 3-hours of Medicare-mandated therapy.   Team  Interventions: Nursing Interventions Patient/Family Education, Pain Management, Bladder Management, Medication Management, Skin Care/Wound Management, Psychosocial Support, Discharge Planning, Disease Management/Prevention, Bowel Management  PT interventions Ambulation/gait training, Balance/vestibular training, Community reintegration, DME/adaptive equipment instruction, Discharge planning, Functional mobility training, Neuromuscular re-education, Patient/family education, Stair training, UE/LE Coordination activities, UE/LE Strength taining/ROM, Splinting/orthotics, Therapeutic Activities, Therapeutic Exercise, Wheelchair propulsion/positioning, Visual/perceptual remediation/compensation  OT Interventions Balance/vestibular training, Discharge planning, Pain management, Self Care/advanced ADL retraining, Therapeutic Activities, UE/LE Coordination activities, Visual/perceptual remediation/compensation, Therapeutic Exercise, Skin care/wound managment, Patient/family education, Functional mobility training, Disease mangement/prevention, Community reintegration, Engineer, drilling, Neuromuscular re-education, Psychosocial support, Splinting/orthotics, UE/LE Strength taining/ROM, Wheelchair propulsion/positioning  SLP Interventions    TR Interventions    SW/CM Interventions Discharge Planning, Psychosocial Support, Patient/Family Education   Barriers to Discharge MD  Medical stability, Home enviroment access/loayout, Neurogenic bowel and bladder, Wound care, Lack of/limited family support, Weight, Pending chemo/radiation and new SCI  Nursing      PT Inaccessible home environment    OT      SLP      SW       Team Discharge Planning: Destination: PT-Home ,OT- Home , SLP-  Projected Follow-up: PT-Home health PT, OT-  Outpatient OT, SLP-  Projected Equipment Needs: PT-To be determined, OT- 3 in 1 bedside comode, Tub/shower bench, SLP-  Equipment Details: PT- , OT-  Patient/family  involved  in discharge planning: PT- Patient,  OT-Patient, SLP-   MD ELOS: 12-14 days Medical Rehab Prognosis:  Fair Assessment: Pt is a 49 yr old female with incomplete paraplegia due to mets from breast cancer- with fungating masses from breast cancer requiring wound care on R breast area- with leukocytosis- could be due to steroids, and ABLA with Hb of 8.6- with pain and constipation/neurogenic bowel issues.  Goals Supervision to mod I doing transfers, ADLs, possible gait, and endurance and strengthening. NO ESTIM.    See Team Conference Notes for weekly updates to the plan of care

## 2020-01-11 NOTE — Care Management (Signed)
Inpatient [facility] Individual Statement of Services  Patient Name:  Misty Arnold  Date:  01/11/2020  Welcome to the [facility].  Our goal is to provide you with an individualized program based on your diagnosis and situation, designed to meet your specific needs.  With this comprehensive rehabilitation program, you will be expected to participate in at least 3 hours of rehabilitation therapies Monday-Friday, with modified therapy programming on the weekends.  Your rehabilitation program will include the following services:  Physical Therapy (PT), Occupational Therapy (OT), Speech Therapy (ST), 24 hour per day rehabilitation nursing, Therapeutic Recreaction (TR), Neuropsychology, Case Management (Social Worker), Rehabilitation Medicine, Nutrition Services, Pharmacy Services and Other  Weekly team conferences will be held on Tuesdays  to discuss your progress.  Your Social Worker will talk with you frequently to get your input and to update you on team discussions.  Team conferences with you and your family in attendance may also be held.  Expected length of stay: 10-12 days   Overall anticipated outcome: Supervision to Independent  Depending on your progress and recovery, your program may change. Your Social Worker will coordinate services and will keep you informed of any changes. Your Social Worker's name and contact numbers are listed  below.  The following services may also be recommended but are not provided by the [facility] will be made to provide these services after discharge if needed.  Arrangements include referral to agencies that provide these services.  Your insurance has been verified to be:  [employer]  Your primary doctor is:  NO PCP  Pertinent information will be shared  with your doctor and your insurance company.  Social Worker:  Loralee Pacas, LCSWA  Information discussed with and copy given to patient by: [name], 01/11/2020, 9:24 AM

## 2020-01-11 NOTE — Progress Notes (Addendum)
Occupational Therapy Session Note  Patient Details  Name: Misty Arnold MRN: [id number] Date of Birth: May 06, 1971  Today's Date: 01/11/2020 OT Individual Time: [id number] OT Individual Time Calculation (min): 68 min   Short Term Goals: Week 1:  OT Short Term Goal 1 (Week 1): Pt will trasnfer to toilet wiht CGA consistently OT Short Term Goal 2 (Week 1): Pt will thread BLE into pants while maintaining BLT precations OT Short Term Goal 3 (Week 1): Pt will groom in standing and MIN A to demo improved endurance OT Short Term Goal 4 (Week 1): pt will transfer to shower wiht CGA  Skilled Therapeutic Interventions/Progress Updates:    Pt greeted in bed, just finished PT but motivated to participate in bathing/dressing tasks. Min A for stand pivot<w/c using RW. She then completed bathing/dressing tasks sit<stand at the sink with CGA for dynamic standing balance. Pt able to utilize figure 4 position functionally and bilaterally without breaking her back precautions. Vcs throughout for locking her w/c brakes during dynamic sitting tasks to increase safety. Increased time and setup for grooming tasks. Min A for stand pivot<toilet using grab bar where pt had continent bladder void. CGA for clothing mgt x2. Pt remained in w/c at end of session with all needs within reach, setup for lunch and very grateful for OT time.    Pt agreeable to earlier than scheduled OT today  Therapy Documentation Precautions:  Precautions Precautions: Back Precaution Booklet Issued: No Precaution Comments: Reviewed back precautions.  Required Braces or Orthoses: Other Brace Other Brace: No brace needed per MD order Restrictions Weight Bearing Restrictions: No Vital Signs: Therapy Vitals Temp: 98.1 F (36.7 C) Pulse Rate: 94 Resp: 20 BP: 123/81 Patient Position (if appropriate): Sitting Oxygen Therapy SpO2: 100 % O2 Device: Room Air Pain: Pt reported she would ask RN for pain medicine at end of session vs taking  pain medicine during tx. Pt reported pain was manageable during session    ADL: ADL Upper Body Bathing: Supervision/safety Where Assessed-Upper Body Bathing: Sitting at sink Lower Body Bathing: Moderate assistance(A for feet) Where Assessed-Lower Body Bathing: Sitting at sink Upper Body Dressing: Supervision/safety Where Assessed-Upper Body Dressing: Sitting at sink Lower Body Dressing: Moderate assistance Where Assessed-Lower Body Dressing: Sitting at sink, Standing at sink, Medical laboratory scientific officer: Minimal assistance Toilet Transfer Method: Stand pivot     Therapy/Group: Individual Therapy  Josephanthony Tindel A Wen Merced 01/11/2020, 3:53 PM

## 2020-01-12 ENCOUNTER — Inpatient Hospital Stay (HOSPITAL_COMMUNITY): Payer: Medicare PPO

## 2020-01-12 ENCOUNTER — Inpatient Hospital Stay (HOSPITAL_COMMUNITY): Payer: Medicare PPO | Admitting: Physical Therapy

## 2020-01-12 ENCOUNTER — Inpatient Hospital Stay (HOSPITAL_COMMUNITY): Payer: Medicare PPO | Admitting: Occupational Therapy

## 2020-01-12 DIAGNOSIS — M7989 Other specified soft tissue disorders: Secondary | ICD-10-CM

## 2020-01-12 MED ORDER — ENOXAPARIN SODIUM 40 MG/0.4ML ~~LOC~~ SOLN
40.0000 mg | SUBCUTANEOUS | Status: DC
  Administered 2020-01-12 – 2020-01-20 (×8): 40 mg via SUBCUTANEOUS
  Filled 2020-01-12 (×9): qty 0.4

## 2020-01-12 NOTE — Progress Notes (Signed)
Bilateral lower extremity venous duplex complete. Please see CV Proc tab for preliminary results. [place], RVT 4:32 PM  01/12/2020

## 2020-01-12 NOTE — Progress Notes (Signed)
Occupational Therapy Session Note  Patient Details  Name: Misty Arnold MRN: [id number] Date of Birth: 1970-12-31  Today's Date: 01/12/2020 OT Individual Time: [id number] OT Individual Time Calculation (min): 45 min    Short Term Goals: Week 1:  OT Short Term Goal 1 (Week 1): Pt will trasnfer to toilet wiht CGA consistently OT Short Term Goal 2 (Week 1): Pt will thread BLE into pants while maintaining BLT precations OT Short Term Goal 3 (Week 1): Pt will groom in standing and MIN A to demo improved endurance OT Short Term Goal 4 (Week 1): pt will transfer to shower wiht CGA  Skilled Therapeutic Interventions/Progress Updates:    1:1 Pt finishing up with nursing changing bandaging. Pt ambulated to the bathroom with contact guard to min A with RW and performing toileting and toilet transfer with contact guard. Pt feels more conformable with LEs propped up on trash can with increased hip flexion. Pt ambulated out to the sink and performed UB bathing and periarea/ buttocks bathing in standing position. Continued focus on maintaining standing balance in functional activity with one or no Ue support. Pt required supervision to min A for balance. Cues for attention to left knee hyperextension to self correct. Pt able to dress UB with I and LB with contact guard. Work on trying to don AFO with instructional cues and A for adjusting laces. Pt Left sitting in the w/c with call bell.   Therapy Documentation Precautions:  Precautions Precautions: Back Precaution Booklet Issued: No Precaution Comments: Reviewed back precautions.  Required Braces or Orthoses: Other Brace Other Brace: No brace needed per MD order Restrictions Weight Bearing Restrictions: No Pain:  reports feeling okay and received meds prior to session.  Therapy/Group: Individual Therapy  Willeen Cass [facility] 01/12/2020, 9:25 AM

## 2020-01-12 NOTE — Plan of Care (Signed)
  Problem: RH Balance Goal: LTG: Patient will maintain dynamic sitting balance (OT) Description: LTG:  Patient will maintain dynamic sitting balance with assistance during activities of daily living (OT) Flowsheets (Taken 01/12/2020 1606) LTG: Pt will maintain dynamic sitting balance during ADLs with: (upgraded JLS) Independent with assistive device Note: upgraded JLS Goal: LTG Patient will maintain dynamic standing with ADLs (OT) Description: LTG:  Patient will maintain dynamic standing balance with assist during activities of daily living (OT)  Flowsheets (Taken 01/12/2020 1606) LTG: Pt will maintain dynamic standing balance during ADLs with: (upgraded JLS) Independent with assistive device Note: upgraded JLS   Problem: RH Balance Goal: LTG Patient will maintain dynamic standing with ADLs (OT) Description: LTG:  Patient will maintain dynamic standing balance with assist during activities of daily living (OT)  Flowsheets (Taken 01/12/2020 1606) LTG: Pt will maintain dynamic standing balance during ADLs with: (upgraded JLS) Independent with assistive device Note: upgraded JLS   Problem: RH Dressing Goal: LTG Patient will perform upper body dressing (OT) Description: LTG Patient will perform upper body dressing with assist, with/without cues (OT). Flowsheets (Taken 01/12/2020 1606) LTG: Pt will perform upper body dressing with assistance level of: (upgraded JLS) Independent with assistive device

## 2020-01-12 NOTE — Patient Care Conference (Addendum)
Inpatient RehabilitationTeam Conference and Plan of Care Update Date: 01/12/2020   Time: 11:15 AM    Patient Name: Misty Arnold      Medical Record Number: [id number]  Date of Birth: 30-Jul-1971 Sex: Female         Room/Bed: 4W24C/4W24C-01 Payor Info: Payor: [employer] MEDICARE / Plan: [employer] MEDICARE CHOICE PPO / Product Type: *No Product type* /    Admit Date/Time:  01/08/2020  4:14 PM  Primary Diagnosis:  Incomplete paraplegia [facility])  Patient Active Problem List   Diagnosis Date Noted  . Myelopathy ([place]) 01/08/2020  . Incomplete paraplegia ([place]) 01/08/2020  . Epidural mass   . Metastatic breast cancer ([place])   . Weakness of both legs   . Leucocytosis   . Acute blood loss anemia   . Neurogenic bladder   . Neurogenic bowel   . S/P laminectomy 01/04/2020  . 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 Breast cancer of upper-outer quadrant of right female breast ([place]) 11/02/2014    Expected Discharge Date: Expected Discharge Date: 01/20/20  Team Members Present: Physician leading conference: Dr. Courtney Heys Care Coodinator Present: Karene Fry, RN, [place], [place] Nurse Present: Dorthula Nettles, RN PT Present: Excell Seltzer, PT OT Present: Willeen Cass, OT;Roanna Epley, COTA SLP Present: Weston Anna, SLP PPS Coordinator present : Ileana Ladd, Burna Mortimer, SLP     Current Status/Progress Goal Weekly Team Focus  Bowel/Bladder   Continent of bowel & bladder, LBM 01/11/20  remain continent  assess for changes & assist as needed   Swallow/Nutrition/ Hydration             ADL's   bathing/dressing-CGA/min A; functional tranfsers-min A; toileting-min/mod A  supervision overall  activity tolerance, functional transfers, BADL training, safety awareness.   Mobility   mod A bed mobility, min/mod SPT with RW, gait up to 40 ft with RW min A with R AFO  mod I overall, CGA stairs  gait training, LE NMR, transfers   Communication              Safety/Cognition/ Behavioral Observations            Pain   pain to upper back & shoulders, has tylenol scheduled TID & oxy 10mg  prn Q3 hrs for scale of 7-10  pain scale <4/10  assess & treat as needed   Skin   has a honeycomb drsg to the upper back incision, xeroform & abd pad with kerlix/ace wrap to right breast wound with scant drainage  no signs of infection, no new areas of skin break down  assess q shift    Rehab Goals Patient on target to meet rehab goals: Yes *See Care Plan and progress notes for long and short-term goals.     Barriers to Discharge  Current Status/Progress Possible Resolutions Date Resolved   Nursing  Pending chemo/radiation;Wound Care               PT  Inaccessible home environment                 OT                  SLP                SW                Discharge Planning/Teaching Needs:  D/c to a friend's home with 24/7 care from various family members and friends  Family education as recommended by  therapy   Team Discussion: Metastatic breast CA, cachetic, decomp lami fixation 3/1, on lovenox, wound care R chest.  RN oxy for pain, cont B/B, BM yesterday, wound care BID, appetite better.  PT CGA/min A transfers, 40' min A, R foot drop/has brace, mod I goals.  OT B/D CGA/min A, transfers min A, toileting min/mod, S goals, may upgrade some goals to mod I.   Revisions to Treatment Plan: N/A     Medical Summary Current Status: gave 10 mg Oxy; continent B/B; midafternoon- miralax- works- Weekly Focus/Goal: PT- CGA-min assist transfers- 40 ft min assist RW: R foot drop- needs AFO; knees hyperextension as well -very motivated- mod I goals  Barriers to Discharge: Decreased family/caregiver support;Home enviroment access/layout;Neurogenic Bowel & Bladder;Weight;Wound care;Other (comments);Medical stability;Pending chemo/radiation  Barriers to Discharge Comments: metastatic breast CA Possible Resolutions to Barriers: OT- bathing/dressing min assist;  transfers min assist; R foot drop; toileting min-mod assist; upgrade goals to mod I likely.   Continued Need for Acute Rehabilitation Level of Care: The patient requires daily medical management by a physician with specialized training in physical medicine and rehabilitation for the following reasons: Direction of a multidisciplinary physical rehabilitation program to maximize functional independence : Yes Medical management of patient stability for increased activity during participation in an intensive rehabilitation regime.: Yes Analysis of laboratory values and/or radiology reports with any subsequent need for medication adjustment and/or medical intervention. : Yes   I attest that I was present, lead the team conference, and concur with the assessment and plan of the team.   Retta Diones 01/12/2020, 8:39 PM   Team conference was held via web/ teleconference due to [place] - 19

## 2020-01-12 NOTE — Progress Notes (Signed)
Occupational Therapy Session Note  Patient Details  Name: Misty Arnold MRN: [id number] Date of Birth: 1971-04-28  Today's Date: 01/12/2020 OT Individual Time: 1330-1430 OT Individual Time Calculation (min): 60 min    Short Term Goals: Week 1:  OT Short Term Goal 1 (Week 1): Pt will trasnfer to toilet wiht CGA consistently OT Short Term Goal 2 (Week 1): Pt will thread BLE into pants while maintaining BLT precations OT Short Term Goal 3 (Week 1): Pt will groom in standing and MIN A to demo improved endurance OT Short Term Goal 4 (Week 1): pt will transfer to shower wiht CGA  Skilled Therapeutic Interventions/Progress Updates:    Pt resting in bed upon arrival and agreeable to therapy.  Supine>sit EOB with supervision.  Pt initiated donning AFO but required min A to complete.  Pt able to don L shoe and required assistance fastening both shoes.  Amb with RW to w/c with CGA/min A. OT intervention with focus on standing balance with/without BUE support and sit<>stand without UE support from various surface heights.  Activities included [employer] with pt requiring unilateral UE support. Pt transitioned to gym and initially engaged in standing tasks with pipe tree.  Standing balance with UE support and CGA. Standing balance with no UE support and min A. Pt performed squats from EOM and sit<>stand from EOM with hands placed on knees.  Pt required min A from lowest height and CGA from highest height. Pt with hyperextended knees when in standing. Pt returned to room and remained in w/c with all needs within reach.   Therapy Documentation Precautions:  Precautions Precautions: Back Precaution Booklet Issued: No Precaution Comments: Reviewed back precautions.  Required Braces or Orthoses: Other Brace Other Brace: No brace needed per MD order Restrictions Weight Bearing Restrictions: No Pain: Pt denies pain this afternoon   Therapy/Group: Individual Therapy  Leroy Libman 01/12/2020,  2:49 PM

## 2020-01-12 NOTE — Progress Notes (Signed)
Social Work Patient ID: Misty Arnold, female   DOB: 03-28-1971, 49 y.o.   MRN: [id number]   SW met with pt in room to provide updates from team conference, and anticipated d/c date 01/20/2020. Pt had many questions related to surgeon coming by to see her incision before discharge, and also a monthly injection. Pt would like to know when her follow-up appt will be with Dr. Trenton Gammon (surgeon) so she can plan with her family. Pt intends to follow-up with her mother to discuss family education over the weekend. SW waiting on follow-up. SW provided pt concerns to medical team.   Loralee Pacas, MSW, [place] Office: [phone] Cell: [phone] Fax: [phone]

## 2020-01-12 NOTE — Progress Notes (Signed)
Physical Therapy Session Note  Patient Details  Name: Misty Arnold MRN: [id number] Date of Birth: [birth year]  Today's Date: 01/12/2020 PT Individual Time: 9675-9163 PT Individual Time Calculation (min): 28 min   Short Term Goals: Week 1:  PT Short Term Goal 1 (Week 1): Pt will increase bed mobility to min A. PT Short Term Goal 2 (Week 1): Pt will increase transfers to c/g. PT Short Term Goal 3 (Week 1): Pt will propel w/c about 150 feet with mod I. PT Short Term Goal 4 (Week 1): Pt will increase ambulation with LRAD about 100 feet with min A. PT Short Term Goal 5 (Week 1): Pt will ascend/descend 4 stairs with B rails and min A.  Skilled Therapeutic Interventions/Progress Updates:    Patient received up in [facility], very pleasant and motivated to participate with therapy today. Took her to the therapy gym in [facility] for time management, she then tolerated gait training approximately 87f with RW and close min guard, cues for improved BOS, step length and upright posture; tolerated 2 bouts of this gait distance with rest break in between. Also continued working on BLE strengthening with mini-squat holds of 3 seconds, also worked on static holds of just barely unlocking knees from hyperextension and holding this position; relies heavily on UEs with these activities, when UE support is removed had significant reduction of control of B knee flexion due to poor quad activation. Returned to bed with ModA to maintain back precautions, left in bed positioned to comfort with all needs met and bed alarm active, RN aware of request for pain medication.   Therapy Documentation Precautions:  Precautions Precautions: Back Precaution Booklet Issued: No Precaution Comments: Reviewed back precautions.  Required Braces or Orthoses: Other Brace Other Brace: No brace needed per MD order Restrictions Weight Bearing Restrictions: No Pain: Pain Assessment Pain Scale: 0-10 Pain Score: 3  Pain Type: Acute pain;Surgical  pain Pain Location: Back Pain Orientation: Upper Pain Radiating Towards: shoulder blades Pain Descriptors / Indicators: Aching;Discomfort Pain Frequency: Intermittent Pain Onset: On-going Patients Stated Pain Goal: 0 Pain Intervention(s): RN made aware;Repositioned Multiple Pain Sites: No    Therapy/Group: Individual Therapy   KWindell Norfolk DPT, PN1   Supplemental Physical Therapist [place]   Pager [phone] Rehab Office [phone]   01/12/2020, 3:45 PM

## 2020-01-12 NOTE — Progress Notes (Signed)
Physical Therapy Session Note  Patient Details  Name: Misty Arnold MRN: [id number] Date of Birth: 08-23-1971  Today's Date: 01/12/2020 PT Individual Time: 0945-1100 PT Individual Time Calculation (min): 75 min   Short Term Goals: Week 1:  PT Short Term Goal 1 (Week 1): Pt will increase bed mobility to min A. PT Short Term Goal 2 (Week 1): Pt will increase transfers to c/g. PT Short Term Goal 3 (Week 1): Pt will propel w/c about 150 feet with mod I. PT Short Term Goal 4 (Week 1): Pt will increase ambulation with LRAD about 100 feet with min A. PT Short Term Goal 5 (Week 1): Pt will ascend/descend 4 stairs with B rails and min A.  Skilled Therapeutic Interventions/Progress Updates:    Pt received seated in w/c in room, agreeable to PT session. Pt reports some soreness at surgical site, adjusted ACE wrap around chest for decreased tightness and pt reports improvement in pain. Manual w/c propulsion x 150 ft with use of BUE at Supervision level for global endurance training. Sit to stand with CGA to RW throughout session. Ambulation 2 x 40 ft with RW and CGA with use of R AFO for R foot drop. Pt also exhibits B knee hyperextension during gait, is able to control somewhat with cueing as well as ataxic LE and scissoring gait pattern. Standing TKE 2 x 15 reps B with use of yellow theraband and mirror for visual feedback. Pt exhibits decreased quad control on LLE as compared to RLE. Sit to supine with mod A for BLE management. Supine BLE strengthening therex and NMR: bridges 2 x 10 reps, resisted hip abd with yellow theraband x 10 reps, hip add squeeze 2 x 10 reps, quad sets 2 x 10 reps, heel slides x 10 reps with AAROM for RLE, hip abd x 10 reps with AAROM for RLE. Pt exhibits significant weakness in B hip abductors and adductors with R weakness > LLE weakness. Supine to sit with min A. Stand pivot transfer mat table to w/c then w/c to toilet as pt requests to use the bathroom with RW then grab bars at CGA  level.. Pt is min A for clothing management and Supervision for standing balance while she performs pericare. Stand pivot transfer back to bed with CGA. Sit to supine mod A for BLE management. Pt left semi-reclined in bed with needs in reach at end of session.  Therapy Documentation Precautions:  Precautions Precautions: Back Precaution Booklet Issued: No Precaution Comments: Reviewed back precautions.  Required Braces or Orthoses: Other Brace Other Brace: No brace needed per MD order Restrictions Weight Bearing Restrictions: No   Therapy/Group: Individual Therapy   Excell Seltzer, PT, DPT  01/12/2020, 3:53 PM

## 2020-01-12 NOTE — Progress Notes (Signed)
[name] PHYSICAL MEDICINE & REHABILITATION PROGRESS NOTE   Subjective/Complaints:   Pt reports has OT at 0830- was wondering if needs to be dressed. Did her own grooming- washed face/brushed teeth this AM.   Sweating at night- explained due to steroids- she agreed.  Bowels- going ~1-2x/day on average last few days- no accidents- has control of bowels.   Agreed pain doing "better"- trying to avoid oxycodone.  ROS: pt denied SOB, CP, Abd pain, N/V/C/D  Objective:   No results found. Recent Labs    01/11/20 0521  WBC 19.1*  HGB 8.6*  HCT 26.8*  PLT 486*   Recent Labs    01/11/20 0521  NA 138  K 4.1  CL 104  CO2 29  GLUCOSE 151*  BUN 15  CREATININE 0.57  CALCIUM 8.6*    Intake/Output Summary (Last 24 hours) at 01/12/2020 1032 Last data filed at 01/12/2020 0900 Gross per 24 hour  Intake 580 ml  Output 400 ml  Net 180 ml     Physical Exam: Vital Signs Blood pressure 140/86, pulse 81, temperature 98.1 F (36.7 C), resp. rate 18, height 5\' 5"  (1.651 m), weight 55.4 kg, SpO2 100 %. Labs and vitals reviewed General: awake, alert, appropriate, sitting up in manual w/c, RN at bedside, NAD Appears very cachetic/frail HENT:  Head: Normocephalic and atraumatic.  Eyes: conjugate gaze Neck: supple CV: RRR no JVD Respiratory: CTA B/L- good air movement GI: still somewhat bloated/distended; NT today; thinks due to surgery; hyperactive BS; soft overall but firmer than expected due to bloating; no rebound.  Musculoskeletal:     Comments: No edema or tenderness in extremities  Neurological: She is alert.  Follows commands oriented x3. Motor:  LLE HF 4/5, KE 4/5, DF 3/5, PF 3/5 RLE- HF 2/5, KE 2/5, DF 0/5, PF 2/5 Has sensory line at T12 where sensation diminishes-  Skin: fungating masses on R breast area- all over R chest and R lateral aspect of chest- covered with some slough on tops of masses- Not tender to palpation; nothing bleeding- covered with vaseline gauze and  kerlex.  back incision covered with honeycomb dressing- no signs of bleeding/drainage.  Psychiatric: extremely calm- working on that affect   Assessment/Plan: 1. 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  Physiatrist is providing close team supervision and 24 hour management of active medical problems listed below.  Physiatrist and rehab team continue to assess barriers to discharge/monitor patient progress toward functional and medical goals  Care Tool:  Bathing    Body parts bathed by patient: Right arm, Left arm, Chest, Abdomen, Front perineal area, Buttocks, Right upper leg, Left upper leg, Face, Right lower leg, Left lower leg   Body parts bathed by helper: Right lower leg, Left lower leg     Bathing assist Assist Level: Contact Guard/Touching assist     Upper Body Dressing/Undressing Upper body dressing   What is the patient wearing?: Pull over shirt    Upper body assist Assist Level: Independent    Lower Body Dressing/Undressing Lower body dressing      What is the patient wearing?: Pants     Lower body assist Assist for lower body dressing: Supervision/Verbal cueing     Toileting Toileting  Toileting assist Assist for toileting: Contact Guard/Touching assist     Transfers Chair/bed transfer  Transfers assist     Chair/bed transfer assist level: Contact Guard/Touching assist     Locomotion Ambulation   Ambulation assist      Assist level: Minimal Assistance - Patient > 75% Assistive device: Walker-rolling Max distance: 35'   Walk 10 feet activity   Assist     Assist  level: Minimal Assistance - Patient > 75% Assistive device: Walker-rolling   Walk 50 feet activity   Assist Walk 50 feet with 2 turns activity did not occur: Safety/medical concerns         Walk 150 feet activity   Assist Walk 150 feet activity did not occur: Safety/medical concerns         Walk 10 feet on uneven surface  activity   Assist Walk 10 feet on uneven surfaces activity did not occur: Safety/medical concerns         Wheelchair     Assist Will patient use wheelchair at discharge?: Yes Type of Wheelchair: Manual    Wheelchair assist level: Supervision/Verbal cueing Max wheelchair distance: 150    Wheelchair 50 feet with 2 turns activity    Assist        Assist Level: Supervision/Verbal cueing   Wheelchair 150 feet activity     Assist      Assist Level: Supervision/Verbal cueing   Blood pressure 140/86, pulse 81, temperature 98.1 F (36.7 C), resp. rate 18, height 5\' 5"  (1.651 m), weight 55.4 kg, SpO2 100 %.  Medical Problem List and Plan: 1.  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  No back brace required.             -patient may not shower             -ELOS/Goals: Supervision/Min A             Continue CIR PT, OT  3/9- did ASIA exam today and determined pt level of injury T11 ASIA C/D 2.  Antithrombotics: -DVT/anticoagulation: SCDs.  Dopplers ordered. Plan to discuss with neurosurgery question Lovenox 3/8- will need Lovenox as soon as OK by [facility] due to cancer and SCI dx. 3/9- got approval to start Lovenox 40 mg daily- started              -antiplatelet therapy: N/A 3. Pain Management: Oxycodone as needed, Valium as needed muscle spasms             Monitor with  increased exertion. Well controlled.   3/7: continues to have pain but trying to rely less on oxycodone. Discussed scheduling Tylenol and she is agreeable.  3/8- pt trying to use tylenol as much as possible.   4. Mood: Provide emotional support             -antipsychotic agents: N/A 5. Neuropsych: This patient is capable of making decisions on her own behalf. 6. Skin/Wound Care: Routine skin checks 7. Fluids/Electrolytes/Nutrition: Routine in and outs.  CMP ordered for tomorrow.  8.  Neurogenic bowel/bladder.  PVRs ordered.  Establish bowel program  3/8- refused any bowel meds except miralax- insists voiding OK.   3/9- daily BMs- 2 yesterday- no accidents- bladder- hasn't needed cathing- con't regimen 9.  History of metastatic breast cancer.  Current plans to follow-up  outpatient radiation oncology Dr. Sondra Come as well as oncology service Dr. Lindi Adie to establish radiation therapy.  Patient's Leslee Home is currently.  3/9- pt upset about changes to fungating masses- what things look like- will get photo this week  10.  Acute blood loss anemia.  CBC ordered. Has not yet resulted.  3/8- Hb down to 8.6- was 9.0- will monitor  11.  Leukocytosis: Likely steroid-induced.  CBC ordered for tomorrow.   3/8- pt's WBC going up 19k- afebrile- denies Sx's of infection except sweating which can come from steroids- will monitor  3/9- labs Thursday 12. Incontinence: Mrs. Skillin was upset that she called to be taken to bathroom to urinate last night and fell asleep before anyone got to her, and that her dressing was soiled with urine this morning. She requests bedpan be placed by bedside in case she has future similar episode. Placed nursing order.   3/7: no similar issues overnight  3/8- concerned that incontinence can be from SCI- will need to do ASIA exam tomorrow. 13. Constipation  3/8- pt only willing to try miralax- encouraged to take today. Will see when goes/if goes.   3/9- improved- going regularly-    I  spent a total of 30 minutes doing ASIA exam today as well as time doing note, calling surgeon to get lovenox AND team conference- total time 50 minutes.    LOS: 4 days A FACE TO FACE EVALUATION WAS PERFORMED  Nalini Alcaraz 01/12/2020, 10:32 AM

## 2020-01-12 NOTE — Progress Notes (Signed)
Patient noted in bed after toiletting this evening.She was given her decadron as ordered & wound care performed at this time as per patients preference. The incision to her upper back has a honeycomb dressing that is still intact with a small amount of dried drainage noted. Pain medication was given earlier per orders & no c/o pain at this time. Right breast wound was undressed & cleansed with normal saline. The area extends from the the outer breast area under the axillary to mid breast with various openings of granulation tissue & what looks like slough. There is a large bulge to the outer upper portion & a hardened area to the inferior part of the wound. No c/o pain to the wound during the procedure. It was dressed as ordered & secured with ace wrap to the patients comfort level. Patient seems very concerned about the appearance of the wound & the way it has transformed since her overall admission. Slight drainage was noted on the old dressing, but no signs of infection was noted. She was made comfortable with call bell within reach. Will continue to monitor for changes.

## 2020-01-13 ENCOUNTER — Inpatient Hospital Stay (HOSPITAL_COMMUNITY): Payer: Medicare PPO | Admitting: Physical Therapy

## 2020-01-13 ENCOUNTER — Inpatient Hospital Stay (HOSPITAL_COMMUNITY): Payer: Medicare PPO

## 2020-01-13 ENCOUNTER — Inpatient Hospital Stay (HOSPITAL_COMMUNITY): Payer: Medicare PPO | Admitting: Occupational Therapy

## 2020-01-13 NOTE — Progress Notes (Signed)
Physical Therapy Session Note  Patient Details  Name: Misty Arnold MRN: [id number] Date of Birth: 1971-11-04  Today's Date: 01/13/2020 PT Individual Time: 0626-9485 PT Individual Time Calculation (min): 57 min   Short Term Goals: Week 1:  PT Short Term Goal 1 (Week 1): Pt will increase bed mobility to min A. PT Short Term Goal 2 (Week 1): Pt will increase transfers to c/g. PT Short Term Goal 3 (Week 1): Pt will propel w/c about 150 feet with mod I. PT Short Term Goal 4 (Week 1): Pt will increase ambulation with LRAD about 100 feet with min A. PT Short Term Goal 5 (Week 1): Pt will ascend/descend 4 stairs with B rails and min A.  Skilled Therapeutic Interventions/Progress Updates:    Patient received in bed, very eager and motivated to participate in session today. Required min guard for supine to sit with HOB elevated, then generally able to perform functional transfers to/from [facility] and to/from commode with min guard and RW. Requested to go outside today, able to self-propel WC distances greater than 131f with BUEs, worked on [facility] of hills outside with min guard for safety and mod VC for technique of safely mobilizing up and down hills. Also continued working on gait with RW and min guard, she continued to need cues for improved BOS, toe clearance L foot, and upright posture. Did add 0.5# weights to bilateral ankles to improve proprioception with definite improvement of gait pattern noted. Required ModA for return to bed to maintain back precautions. Left in bed with all needs met this afternoon.   Therapy Documentation Precautions:  Precautions Precautions: Back Precaution Booklet Issued: No Precaution Comments: Reviewed back precautions.  Required Braces or Orthoses: Other Brace Other Brace: No brace needed per MD order Restrictions Weight Bearing Restrictions: No   Pain: Pain Assessment Pain Scale: 0-10 Pain Score: 3  Pain Type: Surgical pain Pain Location: Back Pain  Orientation: Upper Pain Descriptors / Indicators: Aching;Discomfort Pain Onset: On-going Patients Stated Pain Goal: 0 Pain Intervention(s): Repositioned;Distraction;Ambulation/increased activity Multiple Pain Sites: No    Therapy/Group: Individual Therapy   KWindell Norfolk DPT, PN1   Supplemental Physical Therapist [place]   Pager [phone] Rehab Office [phone]   01/13/2020, 3:39 PM

## 2020-01-13 NOTE — Progress Notes (Signed)
Occupational Therapy Session Note  Patient Details  Name: Misty Arnold MRN: [id number] Date of Birth: [birth year]  Today's Date: 01/13/2020 OT Individual Time: [id number] OT Individual Time Calculation (min): 54 min    Short Term Goals: Week 1:  OT Short Term Goal 1 (Week 1): Pt will trasnfer to toilet wiht CGA consistently OT Short Term Goal 2 (Week 1): Pt will thread BLE into pants while maintaining BLT precations OT Short Term Goal 3 (Week 1): Pt will groom in standing and MIN A to demo improved endurance OT Short Term Goal 4 (Week 1): pt will transfer to shower wiht CGA  Skilled Therapeutic Interventions/Progress Updates:    Upon entering the room, pt having just finished breakfast and agreeable to OT intervention. Pt with no c/o pain this session. Pt performed bed mobility with supervision and transferred into wheelchair with min guard. OT assisted pt into bathroom via wheelchair and pt transferred onto commode with min guard as well for balance with transfer and clothing management. Pt requesting to wash at sink this session. Grooming tasks performed independently while seated in wheelchair.  Pt performed UB self care with set up A and LB self care with min guard for balance while standing. RN arrived to complete dressing change. OT exited the room at this time. All needs within reach.   Therapy Documentation Precautions:  Precautions Precautions: Back Precaution Booklet Issued: No Precaution Comments: Reviewed back precautions.  Required Braces or Orthoses: Other Brace Other Brace: No brace needed per MD order Restrictions Weight Bearing Restrictions: No    ADL: ADL Upper Body Bathing: Supervision/safety Where Assessed-Upper Body Bathing: Sitting at sink Lower Body Bathing: Moderate assistance(A for feet) Where Assessed-Lower Body Bathing: Sitting at sink Upper Body Dressing: Supervision/safety Where Assessed-Upper Body Dressing: Sitting at sink Lower Body Dressing:  Moderate assistance Where Assessed-Lower Body Dressing: Sitting at sink, Standing at sink, Medical laboratory scientific officer: Minimal assistance Toilet Transfer Method: Stand pivot   Therapy/Group: Individual Therapy  Gypsy Decant 01/13/2020, 12:51 PM

## 2020-01-13 NOTE — Progress Notes (Signed)
[place] PHYSICAL MEDICINE & REHABILITATION PROGRESS NOTE   Subjective/Complaints:   Pt reports pain is 2-3/10- slept well- was wondering when Dr Trenton Gammon is coming back to see her- also concerned about starting chemo/radiation until incision is healed, per Dr Trenton Gammon.   Explained we will set up appt with Dr Trenton Gammon outpt- he comes back if there's issues, but since her incision is looking good, no reason to call him back currently- and Heme/Onc will make sure they won't start chemo/radiation until incision is healed.   ROS: pt denies SOB, CP, Abd pain, N/V/C/D  Objective:   VAS [name] LOWER EXTREMITY VENOUS (DVT)  Result Date: 01/12/2020  Lower Venous DVTStudy Indications: Pain.  Performing Technologist: Antonieta Pert RDMS, RVT  Examination Guidelines: A complete evaluation includes B-mode imaging, spectral Doppler, color Doppler, and power Doppler as needed of all accessible portions of each vessel. Bilateral testing is considered an integral part of a complete examination. Limited examinations for reoccurring indications may be performed as noted. The reflux portion of the exam is performed with the patient in reverse Trendelenburg.  +---------+---------------+---------+-----------+----------+--------------+ RIGHT    CompressibilityPhasicitySpontaneityPropertiesThrombus Aging +---------+---------------+---------+-----------+----------+--------------+ CFV      Full           Yes      Yes                                 +---------+---------------+---------+-----------+----------+--------------+ SFJ      Full                                                        +---------+---------------+---------+-----------+----------+--------------+ FV Prox  Full                                                        +---------+---------------+---------+-----------+----------+--------------+ FV Mid   Full                                                         +---------+---------------+---------+-----------+----------+--------------+ FV DistalFull                                                        +---------+---------------+---------+-----------+----------+--------------+ PFV      Full                                                        +---------+---------------+---------+-----------+----------+--------------+ POP      Full           Yes      Yes                                 +---------+---------------+---------+-----------+----------+--------------+  PTV      Full                                                        +---------+---------------+---------+-----------+----------+--------------+ PERO     Full                                                        +---------+---------------+---------+-----------+----------+--------------+ GSV      Full                                                        +---------+---------------+---------+-----------+----------+--------------+   +---------+---------------+---------+-----------+----------+--------------+ LEFT     CompressibilityPhasicitySpontaneityPropertiesThrombus Aging +---------+---------------+---------+-----------+----------+--------------+ CFV      Full           Yes      Yes                                 +---------+---------------+---------+-----------+----------+--------------+ SFJ      Full                                                        +---------+---------------+---------+-----------+----------+--------------+ FV Prox  Full                                                        +---------+---------------+---------+-----------+----------+--------------+ FV Mid   Full                                                        +---------+---------------+---------+-----------+----------+--------------+ FV DistalFull                                                         +---------+---------------+---------+-----------+----------+--------------+ PFV      Full                                                        +---------+---------------+---------+-----------+----------+--------------+ POP      Full           Yes      Yes                                 +---------+---------------+---------+-----------+----------+--------------+  PTV      Full                                                        +---------+---------------+---------+-----------+----------+--------------+ PERO     Full                                                        +---------+---------------+---------+-----------+----------+--------------+ GSV      Full                                                        +---------+---------------+---------+-----------+----------+--------------+     Summary: RIGHT: - There is no evidence of deep vein thrombosis in the lower extremity.  - No cystic structure found in the popliteal fossa.  LEFT: - There is no evidence of deep vein thrombosis in the lower extremity.  - No cystic structure found in the popliteal fossa.  *See table(s) above for measurements and observations. Electronically signed by Harold Barban MD on 01/12/2020 at 5:48:42 PM.    Final    Recent Labs    01/11/20 0521  WBC 19.1*  HGB 8.6*  HCT 26.8*  PLT 486*   Recent Labs    01/11/20 0521  NA 138  K 4.1  CL 104  CO2 29  GLUCOSE 151*  BUN 15  CREATININE 0.57  CALCIUM 8.6*    Intake/Output Summary (Last 24 hours) at 01/13/2020 1710 Last data filed at 01/13/2020 1330 Gross per 24 hour  Intake 360 ml  Output --  Net 360 ml     Physical Exam: Vital Signs Blood pressure 133/77, pulse 85, temperature 98.4 F (36.9 C), temperature source Oral, resp. rate 20, height 5\' 5"  (1.651 m), weight 55.4 kg, SpO2 99 %. Labs and vitals reviewed General:awake, sitting up in manual w/c, in room, NAD; is dressed HENT: oral mucosa moist; conjugate gaze Neck:  supple CV: RRR, no M/R/G Respiratory: CTA B/L- no accessory muscle use GI: softer, less distended; less bloated; hypoactive BS; NT Musculoskeletal:     Comments: No edema or tenderness in extremities  Neurological: She is alert.  Follows commands oriented x3. Motor:  LLE HF 4/5, KE 4/5, DF 3/5, PF 3/5 RLE- HF 2/5, KE 2/5, DF 0/5, PF 2/5 Has sensory line at T12 where sensation diminishes-  Skin: fungating masses on R breast area- all over R chest and R lateral aspect of chest- covered with some slough on tops of masses- Not tender to palpation; nothing bleeding- covered with vaseline gauze and kerlex.   Back incision still with honeycomb dressing- no drainage around it- .  Psychiatric:calm, measured affect   Assessment/Plan: 1. 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  Physiatrist is providing close team supervision and 24  hour management of active medical problems listed below.  Physiatrist and rehab team continue to assess barriers to discharge/monitor patient progress toward functional and medical goals  Care Tool:  Bathing    Body parts bathed by patient: Right arm, Left arm, Chest, Abdomen, Front perineal area, Buttocks, Right upper leg, Left upper leg, Face, Right lower leg, Left lower leg   Body parts bathed by helper: Right lower leg, Left lower leg     Bathing assist Assist Level: Contact Guard/Touching assist     Upper Body Dressing/Undressing Upper body dressing   What is the patient wearing?: Pull over shirt    Upper body assist Assist Level: Independent    Lower Body Dressing/Undressing Lower  body dressing      What is the patient wearing?: Pants     Lower body assist Assist for lower body dressing: Contact Guard/Touching assist     Toileting Toileting    Toileting assist Assist for toileting: Contact Guard/Touching assist     Transfers Chair/bed transfer  Transfers assist     Chair/bed transfer assist level: Contact Guard/Touching assist     Locomotion Ambulation   Ambulation assist      Assist level: Contact Guard/Touching assist Assistive device: Walker-rolling Max distance: 28ft   Walk 10 feet activity   Assist     Assist level: Contact Guard/Touching assist Assistive device: Walker-rolling   Walk 50 feet activity   Assist Walk 50 feet with 2 turns activity did not occur: Safety/medical concerns  Assist level: Contact Guard/Touching assist Assistive device: Walker-rolling    Walk 150 feet activity   Assist Walk 150 feet activity did not occur: Safety/medical concerns         Walk 10 feet on uneven surface  activity   Assist Walk 10 feet on uneven surfaces activity did not occur: Safety/medical concerns         Wheelchair     Assist Will patient use wheelchair at discharge?: Yes Type of Wheelchair: Manual    Wheelchair assist level: Supervision/Verbal cueing Max wheelchair distance: >16ft    Wheelchair 50 feet with 2 turns activity    Assist        Assist Level: Supervision/Verbal cueing   Wheelchair 150 feet activity     Assist      Assist Level: Supervision/Verbal cueing   Blood pressure 133/77, pulse 85, temperature 98.4 F (36.9 C), temperature source Oral, resp. rate 20, height 5\' 5"  (1.651 m), weight 55.4 kg, SpO2 99 %.  Medical Problem List and Plan: 1.  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  No back brace required.             -patient may not shower             -ELOS/Goals: Supervision/Min A             Continue CIR PT, OT  3/9- did ASIA exam today and determined pt level of injury T11 ASIA C/D  3/10- reassured pt Dr Trenton Gammon will come back if issues; otherwise with schedule F/U with him and Heme/Onc- no radiation/chemo until incision is healed- that's Onc recs.  2.  Antithrombotics: -DVT/anticoagulation: SCDs.  Dopplers ordered. Plan to discuss with neurosurgery question Lovenox 3/8- will need Lovenox as soon as OK by NSU due to cancer and SCI dx.  3/9- got approval to start Lovenox 40 mg daily- started  3/10- dopplers done yesterday- no DVTs seen.              -antiplatelet therapy: N/A 3. Pain Management: Oxycodone as needed, Valium as needed muscle spasms             Monitor with increased exertion. Well controlled.   3/7: continues to have pain but trying to rely less on oxycodone. Discussed scheduling Tylenol and she is agreeable.  3/8- pt trying to use tylenol as much as possible.    3/10- pain well controlled- at 2-3/10 after OT.  4. Mood: Provide emotional support             -antipsychotic agents: N/A 5. Neuropsych: This patient is capable of making decisions on her own behalf. 6. Skin/Wound Care: Routine skin checks 7. Fluids/Electrolytes/Nutrition: Routine in and outs.  CMP ordered for tomorrow.  8.  Neurogenic bowel/bladder.  PVRs ordered.  Establish bowel program  3/8- refused any bowel meds except miralax- insists voiding OK.   3/9- daily BMs- 2 yesterday- no accidents- bladder- hasn't needed cathing- con't regimen 9.  History of metastatic breast cancer.  Current plans to follow-up outpatient radiation oncology Dr. Sondra Come as well as oncology service Dr. Lindi Adie to establish radiation therapy.  Patient's Leslee Home is currently.  3/9- pt upset about changes to fungating masses- what things  look like- will get photo this week   3/10- will get photo tomorrow- also to remove honeycomb dressing on back incision.  10.  Acute blood loss anemia.  CBC ordered. Has not yet resulted.  3/8- Hb down to 8.6- was 9.0- will monitor  11.  Leukocytosis: Likely steroid-induced.  CBC ordered for tomorrow.   3/8- pt's WBC going up 19k- afebrile- denies Sx's of infection except sweating which can come from steroids- will monitor  3/10- labs thursday 12. Incontinence: Mrs. Jankovic was upset that she called to be taken to bathroom to urinate last night and fell asleep before anyone got to her, and that her dressing was soiled with urine this morning. She requests bedpan be placed by bedside in case she has future similar episode. Placed nursing order.   3/7: no similar issues overnight  3/8- concerned that incontinence can be from SCI- will need to do ASIA exam tomorrow. 13. Constipation  3/8- pt only willing to try miralax- encouraged to take today. Will see when goes/if goes.   3/9- improved- going regularly-    I spent a total of 30 minutes doing ASIA exam today as well as time doing note, calling surgeon to get lovenox AND team conference- total time 50 minutes.    LOS: 5 days A FACE TO FACE EVALUATION WAS PERFORMED  Jasper Hanf 01/13/2020, 5:10 PM

## 2020-01-13 NOTE — Progress Notes (Signed)
Occupational Therapy Session Note  Patient Details  Name: Paulean Lamotte MRN: [id number] Date of Birth: [birth year]  Today's Date: 01/13/2020 OT Individual Time: 0930-1040 OT Individual Time Calculation (min): 70 min    Short Term Goals: Week 1:  OT Short Term Goal 1 (Week 1): Pt will trasnfer to toilet wiht CGA consistently OT Short Term Goal 2 (Week 1): Pt will thread BLE into pants while maintaining BLT precations OT Short Term Goal 3 (Week 1): Pt will groom in standing and MIN A to demo improved endurance OT Short Term Goal 4 (Week 1): pt will transfer to shower wiht CGA  Skilled Therapeutic Interventions/Progress Updates:    Pt resting in w/c upon arrival and agreeable to participating in therapy. OT intervention with focus on w/c mobility, sit<>stand, and standing balance to increase independence with BADLs. Pt engaged in variety of standing tasks including [employer] and handing clothes pins on basketball net.  CGA for standing balance. Pt also engaged in sit<>squat and reaching task to retrieve and place cards on card board (min A). Pt engaged in squats from EOB with BUE placed on thighs. Pt requires min A for squats but all sit<>stand with CGA. Pt's R knee hyperextends in standing. All stand pivot tranfsers with RW at [facility] level. Pt returned to room and remained in w/c with all needs within reach.   Therapy Documentation Precautions:  Precautions Precautions: Back Precaution Booklet Issued: No Precaution Comments: Reviewed back precautions.  Required Braces or Orthoses: Other Brace Other Brace: No brace needed per MD order Restrictions Weight Bearing Restrictions: No   Pain:  Pt with no initial c/o pain but later in session c/o discomfort along R lateral trunk; stretching during activity alleviated discomfort   Therapy/Group: Individual Therapy  Leroy Libman 01/13/2020, 11:54 AM

## 2020-01-14 ENCOUNTER — Inpatient Hospital Stay (HOSPITAL_COMMUNITY): Payer: Medicare PPO

## 2020-01-14 ENCOUNTER — Inpatient Hospital Stay (HOSPITAL_COMMUNITY): Payer: Medicare PPO | Admitting: Physical Therapy

## 2020-01-14 ENCOUNTER — Inpatient Hospital Stay (HOSPITAL_COMMUNITY): Payer: Medicare PPO | Admitting: *Deleted

## 2020-01-14 LAB — BASIC METABOLIC PANEL
Anion gap: 12 (ref 5–15)
BUN: 19 mg/dL (ref 6–20)
CO2: 26 mmol/L (ref 22–32)
Calcium: 8.8 mg/dL — ABNORMAL LOW (ref 8.9–10.3)
Chloride: 100 mmol/L (ref 98–111)
Creatinine, Ser: 0.56 mg/dL (ref 0.44–1.00)
GFR calc Af Amer: >60 mL/min (ref >60)
GFR calc non Af Amer: >60 mL/min (ref >60)
Glucose, Bld: 158 mg/dL — ABNORMAL HIGH (ref 70–99)
Potassium: 4.2 mmol/L (ref 3.5–5.1)
Sodium: 138 mmol/L (ref 135–145)

## 2020-01-14 LAB — CBC WITH DIFFERENTIAL/PLATELET
Abs Immature Granulocytes: 1.37 10*3/uL — ABNORMAL HIGH (ref 0.00–0.07)
Basophils Absolute: 0.1 10*3/uL (ref 0.0–0.1)
Basophils Relative: 0 %
Eosinophils Absolute: 0 10*3/uL (ref 0.0–0.5)
Eosinophils Relative: 0 %
HCT: 30.1 % — ABNORMAL LOW (ref 36.0–46.0)
Hemoglobin: 9.7 g/dL — ABNORMAL LOW (ref 12.0–15.0)
Immature Granulocytes: 6 %
Lymphocytes Relative: 8 %
Lymphs Abs: 1.7 10*3/uL (ref 0.7–4.0)
MCH: 30.4 pg (ref 26.0–34.0)
MCHC: 32.2 g/dL (ref 30.0–36.0)
MCV: 94.4 fL (ref 80.0–100.0)
Monocytes Absolute: 1.1 10*3/uL — ABNORMAL HIGH (ref 0.1–1.0)
Monocytes Relative: 5 %
Neutro Abs: 18.5 10*3/uL — ABNORMAL HIGH (ref 1.7–7.7)
Neutrophils Relative %: 81 %
Platelets: 531 10*3/uL — ABNORMAL HIGH (ref 150–400)
RBC: 3.19 MIL/uL — ABNORMAL LOW (ref 3.87–5.11)
RDW: 15.9 % — ABNORMAL HIGH (ref 11.5–15.5)
WBC: 22.8 10*3/uL — ABNORMAL HIGH (ref 4.0–10.5)
nRBC: 0.6 % — ABNORMAL HIGH (ref 0.0–0.2)

## 2020-01-14 LAB — URINALYSIS, ROUTINE W REFLEX MICROSCOPIC
Bilirubin Urine: NEGATIVE
Glucose, UA: 150 mg/dL — AB
Hgb urine dipstick: NEGATIVE
Ketones, ur: NEGATIVE mg/dL
Leukocytes,Ua: NEGATIVE
Nitrite: NEGATIVE
Protein, ur: NEGATIVE mg/dL
Specific Gravity, Urine: 1.023 (ref 1.005–1.030)
pH: 5 (ref 5.0–8.0)

## 2020-01-14 NOTE — Progress Notes (Signed)
[facility] PHYSICAL MEDICINE & REHABILITATION PROGRESS NOTE   Subjective/Complaints:  Pt reports had a fall last night- fell on R side- hit right behind head/ear- pain was 8-9/10 last night when occurred- was being helped back from [facility].  Currently pain 1-2/10 after OT.   Also notes- small amount of dried blood on pad/bed- concerned about back incision.   Denies Sx's of UTI/resp infection- is afebrile  ROS: pt denies SOB, CP, abd pain, N/V/C/D  Objective:   VAS [name] LOWER EXTREMITY VENOUS (DVT)  Result Date: 01/12/2020  Lower Venous DVTStudy Indications: Pain.  Performing Technologist: Antonieta Pert RDMS, RVT  Examination Guidelines: A complete evaluation includes B-mode imaging, spectral Doppler, color Doppler, and power Doppler as needed of all accessible portions of each vessel. Bilateral testing is considered an integral part of a complete examination. Limited examinations for reoccurring indications may be performed as noted. The reflux portion of the exam is performed with the patient in reverse Trendelenburg.  +---------+---------------+---------+-----------+----------+--------------+ RIGHT    CompressibilityPhasicitySpontaneityPropertiesThrombus Aging +---------+---------------+---------+-----------+----------+--------------+ CFV      Full           Yes      Yes                                 +---------+---------------+---------+-----------+----------+--------------+ SFJ      Full                                                        +---------+---------------+---------+-----------+----------+--------------+ FV Prox  Full                                                        +---------+---------------+---------+-----------+----------+--------------+ FV Mid   Full                                                        +---------+---------------+---------+-----------+----------+--------------+ FV DistalFull                                                         +---------+---------------+---------+-----------+----------+--------------+ PFV      Full                                                        +---------+---------------+---------+-----------+----------+--------------+ POP      Full           Yes      Yes                                 +---------+---------------+---------+-----------+----------+--------------+ PTV  Full                                                        +---------+---------------+---------+-----------+----------+--------------+ PERO     Full                                                        +---------+---------------+---------+-----------+----------+--------------+ GSV      Full                                                        +---------+---------------+---------+-----------+----------+--------------+   +---------+---------------+---------+-----------+----------+--------------+ LEFT     CompressibilityPhasicitySpontaneityPropertiesThrombus Aging +---------+---------------+---------+-----------+----------+--------------+ CFV      Full           Yes      Yes                                 +---------+---------------+---------+-----------+----------+--------------+ SFJ      Full                                                        +---------+---------------+---------+-----------+----------+--------------+ FV Prox  Full                                                        +---------+---------------+---------+-----------+----------+--------------+ FV Mid   Full                                                        +---------+---------------+---------+-----------+----------+--------------+ FV DistalFull                                                        +---------+---------------+---------+-----------+----------+--------------+ PFV      Full                                                         +---------+---------------+---------+-----------+----------+--------------+ POP      Full           Yes      Yes                                 +---------+---------------+---------+-----------+----------+--------------+  PTV      Full                                                        +---------+---------------+---------+-----------+----------+--------------+ PERO     Full                                                        +---------+---------------+---------+-----------+----------+--------------+ GSV      Full                                                        +---------+---------------+---------+-----------+----------+--------------+     Summary: RIGHT: - There is no evidence of deep vein thrombosis in the lower extremity.  - No cystic structure found in the popliteal fossa.  LEFT: - There is no evidence of deep vein thrombosis in the lower extremity.  - No cystic structure found in the popliteal fossa.  *See table(s) above for measurements and observations. Electronically signed by Harold Barban MD on 01/12/2020 at 5:48:42 PM.    Final    Recent Labs    01/14/20 0540  WBC 22.8*  HGB 9.7*  HCT 30.1*  PLT 531*   Recent Labs    01/14/20 0540  NA 138  K 4.2  CL 100  CO2 26  GLUCOSE 158*  BUN 19  CREATININE 0.56  CALCIUM 8.8*    Intake/Output Summary (Last 24 hours) at 01/14/2020 1053 Last data filed at 01/13/2020 1803 Gross per 24 hour  Intake 480 ml  Output -  Net 480 ml     Physical Exam: Vital Signs Blood pressure (!) 141/92, pulse 75, temperature 98.6 F (37 C), temperature source Oral, resp. rate 20, height 5\' 5"  (1.651 m), weight 55.4 kg, SpO2 100 %. Labs and vitals reviewed General:awake, alert, appropriate, sitting up in manual w/c, OT at bedside, NAD HENT: oral mucosa moist; conjugate gaze; hair net on head Neck: supple CV: RRR: no JVD Respiratory: CTA B/L- good air movement [id number], NT, ND; (+)BS normoactive Musculoskeletal:      Comments: No edema or tenderness in extremities  Neurological: She is alert.  Follows commands oriented x3. Motor:  LLE HF 4/5, KE 4/5, DF 3/5, PF 3/5 RLE- HF 2/5, KE 2/5, DF 0/5, PF 2/5 Has sensory line at T12 where sensation diminishes-  Skin: fungating masses on chest on R- have less slough noted on tops Back incision has steristrips in place- across entire incision- no drainage- a little tiny bit of dried blood near top noted on steristrips. Otherwise, no erythema or issues  Psychiatric:appropriate   Assessment/Plan: 1. 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  Physiatrist is providing close team supervision and 24 hour management of active medical problems listed below.  Physiatrist  and rehab team continue to assess barriers to discharge/monitor patient progress toward functional and medical goals  Care Tool:  Bathing    Body parts bathed by patient: Right arm, Left arm, Chest, Abdomen, Front perineal area, Buttocks, Right upper leg, Left upper leg, Face, Right lower leg, Left lower leg   Body parts bathed by helper: Right lower leg, Left lower leg     Bathing assist Assist Level: Contact Guard/Touching assist     Upper Body Dressing/Undressing Upper body dressing   What is the patient wearing?: Pull over shirt    Upper body assist Assist Level: Independent    Lower Body Dressing/Undressing Lower body dressing      What is the patient wearing?: Pants     Lower body assist Assist for lower body dressing: Contact Guard/Touching assist     Toileting Toileting    Toileting assist Assist for  toileting: Contact Guard/Touching assist     Transfers Chair/bed transfer  Transfers assist     Chair/bed transfer assist level: Contact Guard/Touching assist     Locomotion Ambulation   Ambulation assist      Assist level: Contact Guard/Touching assist Assistive device: Walker-rolling Max distance: 46ft   Walk 10 feet activity   Assist     Assist level: Contact Guard/Touching assist Assistive device: Walker-rolling   Walk 50 feet activity   Assist Walk 50 feet with 2 turns activity did not occur: Safety/medical concerns  Assist level: Contact Guard/Touching assist Assistive device: Walker-rolling    Walk 150 feet activity   Assist Walk 150 feet activity did not occur: Safety/medical concerns         Walk 10 feet on uneven surface  activity   Assist Walk 10 feet on uneven surfaces activity did not occur: Safety/medical concerns         Wheelchair     Assist Will patient use wheelchair at discharge?: Yes Type of Wheelchair: Manual    Wheelchair assist level: Supervision/Verbal cueing Max wheelchair distance: >122ft    Wheelchair 50 feet with 2 turns activity    Assist        Assist Level: Supervision/Verbal cueing   Wheelchair 150 feet activity     Assist      Assist Level: Supervision/Verbal cueing   Blood pressure (!) 141/92, pulse 75, temperature 98.6 F (37 C), temperature source Oral, resp. rate 20, height 5\' 5"  (1.651 m), weight 55.4 kg, SpO2 100 %.  Medical Problem List and Plan: 1.  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  No back brace required.             -patient may not shower             -ELOS/Goals: Supervision/Min A              Continue CIR PT, OT  3/9- did ASIA exam today and determined pt level of injury T11 ASIA C/D  3/10- reassured pt Dr Trenton Gammon will come back if issues; otherwise with schedule F/U with him and Heme/Onc- no radiation/chemo until incision is healed- that's Onc recs.  2.  Antithrombotics: -DVT/anticoagulation: SCDs.  Dopplers ordered. Plan to discuss with neurosurgery question Lovenox 3/8- will need Lovenox as soon as OK by NSU due to cancer and SCI dx. 3/9- got approval to start Lovenox 40 mg daily-  started  3/10- dopplers done yesterday- no DVTs seen. 3/11- pt asked if could come off Lovenox explained can, but her risk of DVT is ?80% in 2 months- if walking, can use Lovenox x8 weeks from surgery- if not, 12 weeks. She voiced understanding and agreed to continue med.              -antiplatelet therapy: N/A 3. Pain Management: Oxycodone as needed, Valium as needed muscle spasms             Monitor with increased exertion. Well controlled.   3/7: continues to have pain but trying to rely less on oxycodone. Discussed scheduling Tylenol and she is agreeable.  3/8- pt trying to use tylenol as much as possible.    3/10- pain well controlled- at 2-3/10 after OT.  4. Mood: Provide emotional support             -antipsychotic agents: N/A 5. Neuropsych: This patient is capable of making decisions on her own behalf. 6. Skin/Wound Care: Routine skin checks 7. Fluids/Electrolytes/Nutrition: Routine in and outs.  CMP ordered for tomorrow.  8.  Neurogenic bowel/bladder.  PVRs ordered.  Establish bowel program  3/8- refused any bowel meds except miralax- insists voiding OK.   3/9- daily BMs- 2 yesterday- no accidents- bladder- hasn't needed cathing- con't regimen 9.  History of metastatic breast cancer.  Current plans to follow-up outpatient radiation oncology Dr. Sondra Come as well as oncology service Dr. Lindi Adie to establish radiation therapy.  Patient's Leslee Home is currently.  3/9- pt upset about changes to fungating  masses- what things look like- will get photo this week   3/10- will get photo tomorrow- also to remove honeycomb dressing on back incision.  10.  Acute blood loss anemia.  CBC ordered. Has not yet resulted.  3/8- Hb down to 8.6- was 9.0- will monitor  11.  Leukocytosis: Likely steroid-induced.  CBC ordered for tomorrow.   3/8- pt's WBC going up 19k- afebrile- denies Sx's of infection except sweating which can come from steroids- will monitor  3/10- labs Thursday  3/11- afebrile, but WBC up to 22.8k- will check U/A and Cx and CXR just to make sure not missing anything and recheck on Sunday.  12. Incontinence: Mrs. Savoca was upset that she called to be taken to bathroom to urinate last night and fell asleep before anyone got to her, and that her dressing was soiled with urine this morning. She requests bedpan be placed by bedside in case she has future similar episode. Placed nursing order.   3/7: no similar issues overnight  3/8- concerned that incontinence can be from SCI- will need to do ASIA exam tomorrow. 13. Constipation  3/8- pt only willing to try miralax- encouraged to take today. Will see when goes/if goes.   3/9- improved- going regularly-       LOS: 6 days A FACE TO FACE EVALUATION WAS PERFORMED  Marlane Hirschmann 01/14/2020, 10:53 AM

## 2020-01-14 NOTE — Progress Notes (Signed)
Patient was assisted up to [facility] by writer,she was assisted to a standing position and personal care was provided by patient.While standing next to the patient it appears that the patients legs became weak and an attempt to lower her back on the [facility] was unsuccessful the patient was assisted to the floor by the writer,landing on her buttock she lean her head against the wall and slightly bump the area behind her left ear against the wall. Immediately assistance was called and patient was placed back in bed by staff and assessed with VS with monitoring. Patient c/o of soreness to area behind left ear, ice pack provided , denies need for medication at this time. Closely monitor. on call Pam Love notified, no new orders at this time, will notify family

## 2020-01-14 NOTE — Plan of Care (Signed)
  Problem: Consults Goal: RH SPINAL CORD INJURY PATIENT EDUCATION Description:  See Patient Education module for education specifics.  Outcome: Progressing   Problem: SCI BOWEL ELIMINATION Goal: RH STG MANAGE BOWEL WITH ASSISTANCE Description: STG Manage Bowel with mod I Assistance. Outcome: Progressing Goal: RH STG SCI MANAGE BOWEL WITH MEDICATION WITH ASSISTANCE Description: STG SCI Manage bowel with medication with mod I assistance. Outcome: Progressing   Problem: SCI BLADDER ELIMINATION Goal: RH STG MANAGE BLADDER WITH ASSISTANCE Description: STG Manage Bladder With Mod I Assistance Outcome: Progressing   Problem: RH PAIN MANAGEMENT Goal: RH STG PAIN MANAGED AT OR BELOW PT'S PAIN GOAL Description: Pain level less than 4 on scale of 0-10 Outcome: Progressing   Problem: RH KNOWLEDGE DEFICIT SCI Goal: RH STG INCREASE KNOWLEDGE OF SELF CARE AFTER SCI Description: Pt will be able to demonstrate understanding of precautions to take to prevent fall and injury with mod I assist. Pt will be able to adhere to medication regimen and follow up cares with mod I assist.  Outcome: Progressing

## 2020-01-14 NOTE — Progress Notes (Signed)
Occupational Therapy Session Note  Patient Details  Name: Misty Arnold MRN: [id number] Date of Birth: [birth year]  Today's Date: 01/14/2020 OT Individual Time: [id number] OT Individual Time Calculation (min): 45 min    Short Term Goals: Week 1:  OT Short Term Goal 1 (Week 1): Pt will trasnfer to toilet wiht CGA consistently OT Short Term Goal 2 (Week 1): Pt will thread BLE into pants while maintaining BLT precations OT Short Term Goal 3 (Week 1): Pt will groom in standing and MIN A to demo improved endurance OT Short Term Goal 4 (Week 1): pt will transfer to shower wiht CGA  Skilled Therapeutic Interventions/Progress Updates:    1;1. Pt received in EOB eating breakfast. Pt agreeable to freshening up at sink/grooming and dressing. Pt completes min guard stand pivot transfer with RW and completes grooming standing at sink with MIN guard-min A with VC for nuetral knee positioning. Pt completes dressing with set up for shirt and MIN A for pants. Set up for socks with crossing into figure 4. Exited session with pt seated in bed, exit alarm on and call light tinr each  Therapy Documentation Precautions:  Precautions Precautions: Back Precaution Booklet Issued: No Precaution Comments: Reviewed back precautions.  Required Braces or Orthoses: Other Brace Other Brace: No brace needed per MD order Restrictions Weight Bearing Restrictions: No General:   Vital Signs: Therapy Vitals Temp: 98.6 F (37 C) Temp Source: Oral Pulse Rate: 75 BP: (!) 141/92 Patient Position (if appropriate): Lying Oxygen Therapy SpO2: 100 % O2 Device: Room Air Pain: Pain Assessment Pain Scale: 0-10 Pain Score: 4  Pain Type: Acute pain Pain Location: Back Pain Radiating Towards: (buttock) ADL: ADL Upper Body Bathing: Supervision/safety Where Assessed-Upper Body Bathing: Sitting at sink Lower Body Bathing: Moderate assistance(A for feet) Where Assessed-Lower Body Bathing: Sitting at sink Upper Body  Dressing: Supervision/safety Where Assessed-Upper Body Dressing: Sitting at sink Lower Body Dressing: Moderate assistance Where Assessed-Lower Body Dressing: Sitting at sink, Standing at sink, Medical laboratory scientific officer: Minimal assistance Toilet Transfer Method: Stand pivot Vision   Perception    Praxis   Exercises:   Other Treatments:     Therapy/Group: Individual Therapy  Tonny Branch 01/14/2020, 8:10 AM

## 2020-01-14 NOTE — Progress Notes (Signed)
Recreational Therapy Session Note  Patient Details  Name: Misty Arnold MRN: [id number] Date of Birth: [birth year] Today's Date: 01/14/2020 Time:  1410-1435 Pain: no c/o Skilled Therapeutic Interventions/Progress Updates: Full TR eval deferred as pt is expected to discharge 3/17 and LRT will be absent until that date.  Met with pt today to discuss/provide education on the importance of leisure/staying active and use of relaxation strategies after discharge.  Discussed life satisfiers and their impact on further recovery and overall health and wellness.  Pt stated understanding.  [place] 01/14/2020, 3:54 PM

## 2020-01-14 NOTE — Progress Notes (Signed)
Occupational Therapy Weekly Progress Note  Patient Details  Name: Misty Arnold MRN: [id number] Date of Birth: [birth year]  Beginning of progress report period: January 09, 2020 End of progress report period: January 14, 2020  Patient has met 4 of 4 short term goals.  Pt is making steady progress in OT sessions improveing from MIN A to consistent CGA-Supervision level for ADLs and transfers. Pt continues to demo decreased strength and coordination in LEs impacting safety and independence with transfers/standing tasks. Pt is on track to meet supervision level goals and focus of week continues to be improving control of LEs/adaptive strategies for ADLs/IADLs and family education.  Patient continues to demonstrate the following deficits: muscle weakness, decreased cardiorespiratoy endurance, impaired timing and sequencing, unbalanced muscle activation and decreased coordination and decreased standing balance, decreased postural control and decreased balance strategies and therefore will continue to benefit from skilled OT intervention to enhance overall performance with BADL and iADL.  Patient progressing toward long term goals..  Continue plan of care.  OT Short Term Goals Week 1:  OT Short Term Goal 1 (Week 1): Pt will trasnfer to toilet wiht CGA consistently OT Short Term Goal 1 - Progress (Week 1): Met OT Short Term Goal 2 (Week 1): Pt will thread BLE into pants while maintaining BLT precations OT Short Term Goal 2 - Progress (Week 1): Met OT Short Term Goal 3 (Week 1): Pt will groom in standing and MIN A to demo improved endurance OT Short Term Goal 3 - Progress (Week 1): Met OT Short Term Goal 4 (Week 1): pt will transfer to shower wiht CGA OT Short Term Goal 4 - Progress (Week 1): Met Week 2:  OT Short Term Goal 1 (Week 2): STG=LTG d/t ELOS  Skilled Therapeutic Interventions/Progress Updates:      Therapy Documentation Precautions:  Precautions Precautions: Back Precaution Booklet  Issued: No Precaution Comments: Reviewed back precautions.  Required Braces or Orthoses: Other Brace Other Brace: No brace needed per MD order Restrictions Weight Bearing Restrictions: No General:   Vital Signs:  Pain:   ADL: ADL Upper Body Bathing: Supervision/safety Where Assessed-Upper Body Bathing: Sitting at sink Lower Body Bathing: Moderate assistance(A for feet) Where Assessed-Lower Body Bathing: Sitting at sink Upper Body Dressing: Supervision/safety Where Assessed-Upper Body Dressing: Sitting at sink Lower Body Dressing: Moderate assistance Where Assessed-Lower Body Dressing: Sitting at sink, Standing at sink, Medical laboratory scientific officer: Minimal assistance Toilet Transfer Method: Stand pivot Vision   Perception    Praxis   Exercises:   Other Treatments:      Tonny Branch 01/14/2020, 12:12 PM

## 2020-01-14 NOTE — Progress Notes (Signed)
Physical Therapy Session Note  Patient Details  Name: Misty Arnold MRN: [id number] Date of Birth: 08-29-1971  Today's Date: 01/14/2020 PT Individual Time: 0623-7628 PT Individual Time Calculation (min): 64 min   Short Term Goals: Week 1:  PT Short Term Goal 1 (Week 1): Pt will increase bed mobility to min A. PT Short Term Goal 2 (Week 1): Pt will increase transfers to c/g. PT Short Term Goal 3 (Week 1): Pt will propel w/c about 150 feet with mod I. PT Short Term Goal 4 (Week 1): Pt will increase ambulation with LRAD about 100 feet with min A. PT Short Term Goal 5 (Week 1): Pt will ascend/descend 4 stairs with B rails and min A.  Skilled Therapeutic Interventions/Progress Updates:    Patient received in [facility], very pleasant and willing to work with therapy today. Continues to require general min guard for transfers and gait for safety throughout session. Spent some time in rest room with distant S but does require min guard for balance and VC to functionally maintain back precautions when performing pericare, also educated to use RW for bathroom transfers instead of twisting back grabbing onto rail. Able to easily self-propel distances of 11f+ with BUEs in WC. Introduced walking outside over uneven brick surface with RW and min guard, use of 0.5# weights for improved BLE proprioception and cues for foot/toe clearance, BOS, and not twisting with RW when turning, also strategies for navigating uneven ground. Returned to the unit and worked on BT[employer]on NH[employer] Financialwith LEs only on level 1 for 5 minutes today. Required ModA for BLE management for return to bed. Left in bed with all needs met, bed alarm active this afternoon.   Therapy Documentation Precautions:  Precautions Precautions: Back Precaution Booklet Issued: No Precaution Comments: Reviewed back precautions.  Required Braces or Orthoses: Other Brace Other Brace: No brace needed per MD order Restrictions Weight Bearing  Restrictions: No   Pain: Pain Assessment Pain Scale: 0-10 Pain Score: 2  Pain Type: Acute pain Pain Location: Generalized Pain Orientation: Mid Pain Descriptors / Indicators: Discomfort;Aching;Sore Pain Frequency: Intermittent Pain Onset: On-going Patients Stated Pain Goal: 0 Pain Intervention(s): Repositioned;Ambulation/increased activity Multiple Pain Sites: No    Therapy/Group: Individual Therapy   KWindell Norfolk DPT, PN1   Supplemental Physical Therapist [place]   Pager [phone] Rehab Office [phone]   01/14/2020, 4:18 PM

## 2020-01-14 NOTE — Progress Notes (Signed)
Occupational Therapy Session Note  Patient Details  Name: Misty Arnold MRN: [id number] Date of Birth: [birth year]  Today's Date: 01/14/2020 OT Individual Time: [id number] OT Individual Time Calculation (min): 75 min    Short Term Goals: Week 1:  OT Short Term Goal 1 (Week 1): Pt will trasnfer to toilet wiht CGA consistently OT Short Term Goal 2 (Week 1): Pt will thread BLE into pants while maintaining BLT precations OT Short Term Goal 3 (Week 1): Pt will groom in standing and MIN A to demo improved endurance OT Short Term Goal 4 (Week 1): pt will transfer to shower wiht CGA  Skilled Therapeutic Interventions/Progress Updates:    Pt resting in w/c upon arrival.  OT intervention with focus on w/c mobility, sit<>stand, standing balance, activity tolerance, and safety awareness to increase independence with BADLs. Pt propelled w/c to gym and transferred to mat table. Pt engaged in standing activities on compliant and noncompliant surface while reaching with RUE and transferring clothespins to LUE to place on basketball net-min a for balance.  Pt completed activity X 2. Pt also completed squats from EOM 2 X8 from 3 heights-min A.  Pt returned to room and remained in w/c with seat alarm activated.   Therapy Documentation Precautions:  Precautions Precautions: Back Precaution Booklet Issued: No Precaution Comments: Reviewed back precautions.  Required Braces or Orthoses: Other Brace Other Brace: No brace needed per MD order Restrictions Weight Bearing Restrictions: No  Pain:  Pt c/o soreness on R buttocks and R occipital following fall with staff from previous night   Therapy/Group: Individual Therapy  Leroy Libman 01/14/2020, 11:59 AM

## 2020-01-14 NOTE — Progress Notes (Signed)
Physical Therapy Session Note  Patient Details  Name: Misty Arnold MRN: [id number] Date of Birth: [birth year]  Today's Date: 01/14/2020 PT Individual Time: 1310-1340 PT Individual Time Calculation (min): 30 min   Short Term Goals: Week 1:  PT Short Term Goal 1 (Week 1): Pt will increase bed mobility to min A. PT Short Term Goal 2 (Week 1): Pt will increase transfers to c/g. PT Short Term Goal 3 (Week 1): Pt will propel w/c about 150 feet with mod I. PT Short Term Goal 4 (Week 1): Pt will increase ambulation with LRAD about 100 feet with min A. PT Short Term Goal 5 (Week 1): Pt will ascend/descend 4 stairs with B rails and min A.  Skilled Therapeutic Interventions/Progress Updates:    Pt received seated in w/c in room, finishing lunch and requesting therapist return in 10 min. This therapist returns after 10 min and pt ready for therapy session. Pt reports 2/10 pain at surgical site in back at rest with no increase during session. Pt requests to use the bathroom. Stand pivot transfer w/c to toilet with CGA for safety. Pt is setup A for pericare and max A for clothing management for safety. Manual w/c propulsion to/from therapy gym at Supervision level. Cues for leg rest management. Stand pivot transfer w/c to/from mat table with RW and CGA. Session focus on BLE NMR with use of mirror for visual feedback. Standing alt L/R 4" step taps with RW and CGA for balance. Pt exhibits significant compensatory strategies with RLE due to hip flexor weakness, decreased step height to 1". Standing alt L/R 1" step taps with RW and CGA with min manual cueing for decreased R hip circumduction. Pt also exhibits B knee hyperextension in stance. Standing mini-squats 2 x 15 reps to fatigue with RW and CGA for balance with max multimodal cueing for correct exercise performance. Pt exhibits difficulty unlocking L knee initially during mini-squat with manual cues needed to initiate. As exercise progress pt exhibits improved  symmetry in BLE. Pt left seated in w/c in room with needs in reach, chair alarm in place at end of session.  Therapy Documentation Precautions:  Precautions Precautions: Back Precaution Booklet Issued: No Precaution Comments: Reviewed back precautions.  Required Braces or Orthoses: Other Brace Other Brace: No brace needed per MD order Restrictions Weight Bearing Restrictions: No   Therapy/Group: Individual Therapy   Excell Seltzer, PT, DPT  01/14/2020, 2:44 PM

## 2020-01-14 NOTE — Progress Notes (Signed)
Message left for Misty Arnold contact person to return call to unit to inform of fall occurrence,informed on coming nurse .Marland Kitchen

## 2020-01-15 ENCOUNTER — Inpatient Hospital Stay (HOSPITAL_COMMUNITY): Payer: Medicare PPO

## 2020-01-15 ENCOUNTER — Inpatient Hospital Stay (HOSPITAL_COMMUNITY): Payer: Medicare PPO | Admitting: Physical Therapy

## 2020-01-15 ENCOUNTER — Other Ambulatory Visit: Payer: Self-pay | Admitting: *Deleted

## 2020-01-15 DIAGNOSIS — Z170 Estrogen receptor positive status [ER+]: Secondary | ICD-10-CM

## 2020-01-15 DIAGNOSIS — C50411 Malignant neoplasm of upper-outer quadrant of right female breast: Secondary | ICD-10-CM

## 2020-01-15 LAB — URINE CULTURE

## 2020-01-15 NOTE — Progress Notes (Signed)
Social Work Patient ID: [name], female   DOB: February 03, 1971, 49 y.o.   MRN: [id number]   Caregiver education at 1pm on Saturday. SW explained to pt that her mother may be asked to come in again. Likely to be date of discharge.    Loralee Pacas, MSW, [place] Office: [phone] Cell: [phone] Fax: [phone]

## 2020-01-15 NOTE — Plan of Care (Signed)
  Problem: Consults Goal: RH SPINAL CORD INJURY PATIENT EDUCATION Description:  See Patient Education module for education specifics.  Outcome: Progressing   Problem: SCI BOWEL ELIMINATION Goal: RH STG MANAGE BOWEL WITH ASSISTANCE Description: STG Manage Bowel with mod I Assistance. Outcome: Progressing Goal: RH STG SCI MANAGE BOWEL WITH MEDICATION WITH ASSISTANCE Description: STG SCI Manage bowel with medication with mod I assistance. Outcome: Progressing   Problem: SCI BLADDER ELIMINATION Goal: RH STG MANAGE BLADDER WITH ASSISTANCE Description: STG Manage Bladder With Mod I Assistance Outcome: Progressing   Problem: RH PAIN MANAGEMENT Goal: RH STG PAIN MANAGED AT OR BELOW PT'S PAIN GOAL Description: Pain level less than 4 on scale of 0-10 Outcome: Progressing   Problem: RH KNOWLEDGE DEFICIT SCI Goal: RH STG INCREASE KNOWLEDGE OF SELF CARE AFTER SCI Description: Pt will be able to demonstrate understanding of precautions to take to prevent fall and injury with mod I assist. Pt will be able to adhere to medication regimen and follow up cares with mod I assist.  Outcome: Progressing

## 2020-01-15 NOTE — Progress Notes (Signed)
Physical Therapy Session Note  Patient Details  Name: Misty Arnold MRN: [id number] Date of Birth: 09-26-1971  Today's Date: 01/15/2020 PT Individual Time: 1100-1200 PT Individual Time Calculation (min): 60 min   Short Term Goals: Week 1:  PT Short Term Goal 1 (Week 1): Pt will increase bed mobility to min A. PT Short Term Goal 2 (Week 1): Pt will increase transfers to c/g. PT Short Term Goal 3 (Week 1): Pt will propel w/c about 150 feet with mod I. PT Short Term Goal 4 (Week 1): Pt will increase ambulation with LRAD about 100 feet with min A. PT Short Term Goal 5 (Week 1): Pt will ascend/descend 4 stairs with B rails and min A.  Skilled Therapeutic Interventions/Progress Updates:    Pt received seated in bed, agreeable to PT session. Pt reports 7/10 pain in mid-low back at rest, requests pain medication. RN able to provide pain medication during session. Bed mobility Supervision. Assisted pt with donning tennis shoes and R AFO while seated EOB. Pt requests to use the bathroom. Ambulation into bathroom with RW at [facility] level. Pt is CGA for toilet transfer, setup A for pericare. Standing balance with Supervision at sink while washing hands. Pt is at mod I level for w/c mobility up to 200 ft with use of BUE. Sit to stand with Supervision to RW. Ambulation 2 x 100 ft with RW and CGA. Pt exhibits decrease in B knee hyperextension during gait but does continue to exhibit some. Pt exhibits improved RLE clearance with use of AFO but does need to cues to attend to R foot as it does catch at times. Pt reports improvement in BLE proprioception and that numbness she has been experiencing is now only in her calves and distally. Standing alt L/R 3" step ups with BUE support and min A for BLE NMR. Pt exhibits increase in B knee hyperextension with steps. Manual cueing for decreased R hip circumduction when ascending step. Pt left seated in w/c in room with needs in reach at end of session.  Therapy  Documentation Precautions:  Precautions Precautions: Back Precaution Booklet Issued: No Precaution Comments: Reviewed back precautions.  Required Braces or Orthoses: Other Brace Other Brace: No brace needed per MD order Restrictions Weight Bearing Restrictions: No    Therapy/Group: Individual Therapy   Excell Seltzer, PT, DPT  01/15/2020, 12:51 PM

## 2020-01-15 NOTE — Progress Notes (Signed)
Changed dressing to right breast.  More purulent drainage exiting the lowest part of the wound.  While cleansing with saline, noticed area opening at the bottom just below the blackened portion.  Palpated the surrounding tissue and was able to produce more purulent drainage from bottom of wound.  There appears to be undermining or tunneling of that area which wasn't visible before.  Drainage has very foul odor.  Encouraged patient to speak with Dr. Dagoberto Ligas in am regarding her concerns that the wound is worsening.  Applied xeroform, abd pad, kerlix, and ace per order.  Patient tolerated well.  Brita Romp, RN

## 2020-01-15 NOTE — Progress Notes (Signed)
Occupational Therapy Session Note  Patient Details  Name: Misty Arnold MRN: [id number] Date of Birth: [birth year]  Today's Date: 01/15/2020 OT Individual Time: 0700-0728 OT Individual Time Calculation (min): 28 min    Short Term Goals: Week 1:  OT Short Term Goal 1 (Week 1): Pt will trasnfer to toilet wiht CGA consistently OT Short Term Goal 1 - Progress (Week 1): Met OT Short Term Goal 2 (Week 1): Pt will thread BLE into pants while maintaining BLT precations OT Short Term Goal 2 - Progress (Week 1): Met OT Short Term Goal 3 (Week 1): Pt will groom in standing and MIN A to demo improved endurance OT Short Term Goal 3 - Progress (Week 1): Met OT Short Term Goal 4 (Week 1): pt will transfer to shower wiht CGA OT Short Term Goal 4 - Progress (Week 1): Met  Skilled Therapeutic Interventions/Progress Updates:    1;1. Pt received in bed agreeable to toileting, dressing and grooming d/t short session. Pt supine>sitting EOB with S and HOB elevated/bed rails. Pt completes stand pivot transfer with RW EOB<>w/c<>toilet with min guard. Pt completes toileting and hygiene managing gown in standing and wiping seated with no more than CGA for standing balance. Pt completes UB dressing with setup and LB dressing with MIN guard for standing balance using sink for support. Pt completes oral care with setup seated at sink for energy conservation. Exited session with pt seated in w/c, exit alarm on and call light inerach  Therapy Documentation Precautions:  Precautions Precautions: Back Precaution Booklet Issued: No Precaution Comments: Reviewed back precautions.  Required Braces or Orthoses: Other Brace Other Brace: No brace needed per MD order Restrictions Weight Bearing Restrictions: No General:   Vital Signs: Therapy Vitals Temp: 98.6 F (37 C) Pulse Rate: 92 Resp: 18 BP: (!) 143/91(c/o pain medicated) Patient Position (if appropriate): Lying Oxygen Therapy SpO2: 99 % O2 Device: Room  Air Pain: Pain Assessment Pain Score: 5  ADL: ADL Upper Body Bathing: Supervision/safety Where Assessed-Upper Body Bathing: Sitting at sink Lower Body Bathing: Moderate assistance(A for feet) Where Assessed-Lower Body Bathing: Sitting at sink Upper Body Dressing: Supervision/safety Where Assessed-Upper Body Dressing: Sitting at sink Lower Body Dressing: Moderate assistance Where Assessed-Lower Body Dressing: Sitting at sink, Standing at sink, Medical laboratory scientific officer: Minimal assistance Toilet Transfer Method: Stand pivot Vision   Perception    Praxis   Exercises:   Other Treatments:     Therapy/Group: Individual Therapy  Tonny Branch 01/15/2020, 7:09 AM

## 2020-01-15 NOTE — Progress Notes (Signed)
Occupational Therapy Session Note  Patient Details  Name: Misty Arnold MRN: [id number] Date of Birth: 20-Feb-1971  Today's Date: 01/15/2020 OT Individual Time: 1572-6203 OT Individual Time Calculation (min): 60 min    Short Term Goals: Week 1:  OT Short Term Goal 1 (Week 1): Pt will trasnfer to toilet wiht CGA consistently OT Short Term Goal 1 - Progress (Week 1): Met OT Short Term Goal 2 (Week 1): Pt will thread BLE into pants while maintaining BLT precations OT Short Term Goal 2 - Progress (Week 1): Met OT Short Term Goal 3 (Week 1): Pt will groom in standing and MIN A to demo improved endurance OT Short Term Goal 3 - Progress (Week 1): Met OT Short Term Goal 4 (Week 1): pt will transfer to shower wiht CGA OT Short Term Goal 4 - Progress (Week 1): Met  Skilled Therapeutic Interventions/Progress Updates:  Pt received seated in w/c upon OT arrival agreeable to OT intervention. Pt self propelled w/c to therapy gym where pt completed various dynamic balance challenges on compliant and noncompliant surfaces with CGA and RW. Pt complete x2 trials on dynavision  ( 1st trial on noncompliant surface: score 123, avg reaction time 1.95 and 2nd trial score on compliant surface:  Score 161, avg reaction time 1.86). Pt complete lateral lean therapeutic activity where pt instructed to lean laterally on mat table to retrieve bean bags and toss into bucket to simulate lateral leans during pericare in order to maintain back precautions. Pt complete half squats from EOM to retrieve beans bags on table and toss into bucket as precursor to functional sit<>stands. Issued pt leg lifter and provided education on using to assist with LB dressing. Pt able to demo technique with good carryover very appreciative of education and AE. Pt able to self- propel w/c  to ADL apartment where pt completed bed mobility from flat surface with supervision for safety able to demo good technique where pt scooted self back onto bed  into long sitting  as pt reports unable to complete log roll technique without assist as pt cannot lift BLEs to EOB without assist. Pt complete home mgmt IADL tasks from RW level where pt instructed to gather needed IADLs from kitchen. Education provided on compensatory method related to IADL tasks in kitchen with pt verbalizing understanding. Pt self propelled w/c back to room where pt was able to walk in to bathroom with RW and CGA to complete toileting needing set- up assist for pericare. Pt left supine in bed with bed alarm activated and all needs within reach.   Therapy Documentation Precautions:  Precautions Precautions: Back Precaution Booklet Issued: No Precaution Comments: Reviewed back precautions.  Required Braces or Orthoses: Other Brace Other Brace: No brace needed per MD order Restrictions Weight Bearing Restrictions: No General:   Vital Signs:  Pain: Pt reports minimal pain in back upon arrival with no intervention needed.   Therapy/Group: Individual Therapy  Ihor Gully 01/15/2020, 10:30 AM

## 2020-01-15 NOTE — Progress Notes (Signed)
[facility] [facility] PHYSICAL MEDICINE & REHABILITATION PROGRESS NOTE   Subjective/Complaints:  Pt reports up at 3am due to pain- couldn't sleep due to pain, so took an oxycodone- was able to go back to sleep for a few hours til therapy.   Pain OK this AM.  LBM this AM- no issues;   Denies any Sx's of UTI- no burning, urgency, dysuria; and no breathing issues.  CXR (-) and U/A (-) for infection.     ROS: pt denies Sob, CP, abd pain, N/V/C/D; upset about sight of R chest- how it looks.     Objective:   DG CHEST PORT 1 VIEW  Result Date: 01/14/2020 CLINICAL DATA:  Leukocytosis. EXAM: PORTABLE CHEST 1 VIEW COMPARISON:  None. FINDINGS: The heart size and mediastinal contours are within normal limits. Both lungs are clear. No pneumothorax or pleural effusion is noted. Status post surgical internal fixation of the thoracic spine. IMPRESSION: No active disease. Electronically Signed   By: Marijo Conception M.D.   On: 01/14/2020 11:53   Recent Labs    01/14/20 0540  WBC 22.8*  HGB 9.7*  HCT 30.1*  PLT 531*   Recent Labs    01/14/20 0540  NA 138  K 4.2  CL 100  CO2 26  GLUCOSE 158*  BUN 19  CREATININE 0.56  CALCIUM 8.8*    Intake/Output Summary (Last 24 hours) at 01/15/2020 1323 Last data filed at 01/15/2020 0900 Gross per 24 hour  Intake 480 ml  Output 1 ml  Net 479 ml     Physical Exam: Vital Signs Blood pressure (!) 143/91, pulse 92, temperature 98.6 F (37 C), resp. rate 18, height 5\' 5"  (1.651 m), weight 55.4 kg, SpO2 99 %. Labs and vitals reviewed General: pt alert, sitting up in manual w/c, watching TV, NAD HENT: different hair covering in place; conjugate gaze Neck: supple CV: RRR; no JVD Respiratory: CTA B/L- no accessory muscle use;  GI: soft, NT, ND, (+) hypoactive BS Musculoskeletal:     Comments: No edema or tenderness in extremities  Neurological: She is alert.  Ox3 Motor:  LLE HF 4/5, KE 4/5, DF 3/5, PF 3/5 RLE- HF 2/5, KE 2/5, DF 0/5, PF 2/5 Has  sensory line at T12 where sensation diminishes-  Skin: fungating masses on chest on R- have less slough noted on tops Back incision has steristrips in place- across entire incision- no drainage- a little tiny bit of dried blood near top noted on steristrips. Otherwise, no erythema or issues  Psychiatric: calm, measured affect  Assessment/Plan: 1. 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  Physiatrist is providing close team supervision and 24 hour management of active medical problems listed below.  Physiatrist and rehab team continue to assess barriers to discharge/monitor patient progress toward functional and medical goals  Care Tool:  Bathing    Body parts bathed by patient: Right arm, Left arm, Chest, Abdomen, Front perineal area, Buttocks, Right upper leg, Left upper leg, Face, Right lower leg, Left lower leg   Body parts bathed by helper: Right lower leg, Left lower leg     Bathing assist Assist Level: Contact Guard/Touching assist     Upper Body Dressing/Undressing Upper body dressing   What is the patient  wearing?: Pull over shirt    Upper body assist Assist Level: Independent    Lower Body Dressing/Undressing Lower body dressing      What is the patient wearing?: Pants     Lower body assist Assist for lower body dressing: Contact Guard/Touching assist     Toileting Toileting    Toileting assist Assist for toileting: Contact Guard/Touching assist     Transfers Chair/bed transfer  Transfers assist     Chair/bed transfer assist level: Contact Guard/Touching assist      Locomotion Ambulation   Ambulation assist      Assist level: Contact Guard/Touching assist Assistive device: Walker-rolling Max distance: 100'   Walk 10 feet activity   Assist     Assist level: Contact Guard/Touching assist Assistive device: Walker-rolling   Walk 50 feet activity   Assist Walk 50 feet with 2 turns activity did not occur: Safety/medical concerns  Assist level: Contact Guard/Touching assist Assistive device: Walker-rolling    Walk 150 feet activity   Assist Walk 150 feet activity did not occur: Safety/medical concerns         Walk 10 feet on uneven surface  activity   Assist Walk 10 feet on uneven surfaces activity did not occur: Safety/medical concerns         Wheelchair     Assist Will patient use wheelchair at discharge?: Yes Type of Wheelchair: Manual    Wheelchair assist level: Supervision/Verbal cueing Max wheelchair distance: >186ft    Wheelchair 50 feet with 2 turns activity    Assist        Assist Level: Supervision/Verbal cueing   Wheelchair 150 feet activity     Assist      Assist Level: Supervision/Verbal cueing   Blood pressure (!) 143/91, pulse 92, temperature 98.6 F (37 C), resp. rate 18, height 5\' 5"  (1.651 m), weight 55.4 kg, SpO2 99 %.  Medical Problem List and Plan: 1.  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  No back brace required.             -patient may not shower             -ELOS/Goals: Supervision/Min A             Continue CIR PT, OT  3/9- did ASIA exam today and determined pt level of injury T11 ASIA C/D  3/10- reassured pt Dr Trenton Gammon will come back if issues; otherwise with schedule F/U with him and Heme/Onc- no  radiation/chemo until incision is healed- that's Onc recs.   3/12- pt upset about "sight" of R chest masses/lesions from R breast CA- will see if can be seen by Neuropsychology.  2.  Antithrombotics: -DVT/anticoagulation: SCDs.  Dopplers ordered. Plan to discuss with neurosurgery question Lovenox 3/8- will need Lovenox as soon as OK by NSU due to cancer and SCI dx. 3/9- got approval to start Lovenox 40 mg daily- started  3/10- dopplers done yesterday- no DVTs seen. 3/11- pt asked if could come off Lovenox explained can, but her risk of DVT is ?80% in 2 months- if walking, can use Lovenox x8 weeks from surgery- if not, 12 weeks. She voiced understanding and agreed to continue med.              -antiplatelet therapy: N/A 3. Pain Management: Oxycodone as needed,  Valium as needed muscle spasms             Monitor with increased exertion. Well controlled.   3/7: continues to have pain but trying to rely less on oxycodone. Discussed scheduling Tylenol and she is agreeable.  3/8- pt trying to use tylenol as much as possible.    3/10- pain well controlled- at 2-3/10 after OT.   3/12- still taking oxycodone sometime,s but also intermixed with tylenol.  4. Mood: Provide emotional support             -antipsychotic agents: N/A 5. Neuropsych: This patient is capable of making decisions on her own behalf. 6. Skin/Wound Care: Routine skin checks 7. Fluids/Electrolytes/Nutrition: Routine in and outs.  CMP ordered for tomorrow.  8.  Neurogenic bowel/bladder.  PVRs ordered.  Establish bowel program  3/8- refused any bowel meds except miralax- insists voiding OK.   3/9- daily BMs- 2 yesterday- no accidents- bladder- hasn't needed cathing- con't regimen 9.  History of metastatic breast cancer.  Current plans to follow-up outpatient radiation oncology Dr. Sondra Come as well as oncology service Dr. Lindi Adie to establish radiation therapy.  Patient's [facility] Home is currently.  3/9- pt upset about changes to fungating  masses- what things look like-    3/12- pt uncomfortable with photo? However keeping a close monitor on R chest and appearance.  10.  Acute blood loss anemia.  CBC ordered. Has not yet resulted.  3/8- Hb down to 8.6- was 9.0- will monitor  11.  Leukocytosis: Likely steroid-induced.  CBC ordered for tomorrow.   3/8- pt's WBC going up 19k- afebrile- denies Sx's of infection except sweating which can come from steroids- will monitor  3/10- labs Thursday  3/11- afebrile, but WBC up to 22.8k- will check U/A and Cx and CXR just to make sure not missing anything and recheck on Sunday.   3/12- no Sx's of UTI or resp issues- CXR and U/A (-) for infection.  12. Incontinence: Mrs. Schwend was upset that she called to be taken to bathroom to urinate last night and fell asleep before anyone got to her, and that her dressing was soiled with urine this morning. She requests bedpan be placed by bedside in case she has future similar episode. Placed nursing order.   3/7: no similar issues overnight  3/8- concerned that incontinence can be from SCI- will need to do ASIA exam tomorrow. 13. Constipation  3/8- pt only willing to try miralax- encouraged to take today. Will see when goes/if goes.   3/9- improved- going regularly-  3/12- LBM this AM        LOS: 7 days A FACE TO FACE EVALUATION WAS PERFORMED  Clare Fennimore 01/15/2020, 1:23 PM

## 2020-01-15 NOTE — Progress Notes (Signed)
Patient has rested comfortable most of shift, alert and cooperative has verbalized discomfort of pain to surgical area of back and was medicated, verbalized relief from medication.Adhesive strips intact  with no drainage or irritation noted . Assisted and toileted to [facility] and tolerate well with staff assistance , weakness to LE's remains with unsteady gait.Right breast dressing redressed per orders with small amount of drainage noted, patient is verbalized her anxiousness with the way the area look stating she "can't hardly look at it now"and is concern with the healing process.Reassurance provided and support. Made as comfortable as possible, monitor closely, call bell placed within reach, bed alarm on,Patient remains on High Risk Precautions

## 2020-01-15 NOTE — Progress Notes (Signed)
Physical Therapy Session Note  Patient Details  Name: Misty Arnold MRN: [id number] Date of Birth: [birth year]  Today's Date: 01/15/2020 PT Individual Time: 8563-1497 PT Individual Time Calculation (min): 61 min   Short Term Goals: Week 1:  PT Short Term Goal 1 (Week 1): Pt will increase bed mobility to min A. PT Short Term Goal 2 (Week 1): Pt will increase transfers to c/g. PT Short Term Goal 3 (Week 1): Pt will propel w/c about 150 feet with mod I. PT Short Term Goal 4 (Week 1): Pt will increase ambulation with LRAD about 100 feet with min A. PT Short Term Goal 5 (Week 1): Pt will ascend/descend 4 stairs with B rails and min A.  Skilled Therapeutic Interventions/Progress Updates:    Patient received in [facility], reporting a bit of increased fatigue this afternoon. Continues to be able to perform WC mobility distances over 155f without difficulty. Consistently able to perform functional transfers with S-min guard and RW; continued practicing steps with B rails however shows increased knee hyperextension in stance phase with reduced ability to correct today due to fatigue. Worked on BUE endurance [facility] fatigue after steps, then focused remainder of session on gait and tolerated gait training approximately 672f3 with RW. Continues to require cues for improved BOS, used green line in hallway to promote this as well with moderate success. Returned to her room and transferred back to bed with min guard/RW for pivot/ModA for BLEs back into bed due to fatigue. Left in bed with all needs met, bed alarm active. Continues to progress well.   Therapy Documentation Precautions:  Precautions Precautions: Back Precaution Booklet Issued: No Precaution Comments: Reviewed back precautions.  Required Braces or Orthoses: Other Brace Other Brace: No brace needed per MD order Restrictions Weight Bearing Restrictions: No   Pain: Pain Assessment Pain Scale: 0-10 Pain Score: 0-No  pain Faces Pain Scale: No hurt    Therapy/Group: Individual Therapy   KrWindell NorfolkDPT, PN1   Supplemental Physical Therapist [place]  Pager [phone] [phone]  01/15/2020, 4:01 PM

## 2020-01-16 ENCOUNTER — Inpatient Hospital Stay (HOSPITAL_COMMUNITY): Payer: Medicare PPO | Admitting: Physical Therapy

## 2020-01-16 ENCOUNTER — Ambulatory Visit (HOSPITAL_COMMUNITY): Payer: Medicare PPO | Admitting: Physical Therapy

## 2020-01-16 NOTE — Consult Note (Addendum)
[place] Nurse Consult Note: Reason for Consult: Patient last seen by this writer 10 days ago for initial assessment (01/06/20). Please also refer to that note. At that time, patient did not express curiousity about the healing of this lesion. She stated that she most feared "sticking" (adherence) of dressings as she had experienced bleeding with dressing removal at home. She complained of odor and noted it affected both her and those with whom she cared to spend time.  I discussed that the dressing provided (xeroform) would be a nonadherent, that the medicinal odor was due to bismuth-the astringent property of the dressing-and that we would begin with this POC for odor mitigation and drainage containment. Twice daily dressings were initiated. At that time, she had no other questions.  We did not discuss that the topical care plan was one of maintenance for this fungating breast tumor rather than curative.  Nurses notes from Friday 01/15/20 am note that patient does not want to look at the lesion and is asking about healability of wound. A small amount of drainage was noted on the old dressing at that time.  Wound type: Neoplastic Pressure Injury POA: N/A Measurement: 12cm x 7cm with varying depths Wound bed: 80% red, 20% yellow with elevations and crevices (irregular wound bed) Drainage (amount, consistency, odor) Has been small to moderate; last night 01/15/20 pm/evening Bedside RN noted purulence from distal aspect of wound. Periwound:Intact, dry Dressing procedure/placement/frequency: Currently the twice daily antimicrobial dressing (xeroform) is accomplishing the stated goals of non adherence and odor mitigation. It is noted that this would be an economical care plan for her in the community: feminine hygiene pads could be substituted for ABD pads and a tubular bandage or tube-type top substituted for the Kerlix and ACE bandaging. As previously noted, a breast binder may be considered.  Frequency of dressing  changes could be increased or decreased as desired. Additional measures for odor management would include crushed flagyl tablets sprinkled over the wound bed prior to dressing, ingestion of chlorophyl tablets (commercially available products such as Null-O), or brief periods of dressing with sodium hypochlorite solution (Dakin's Solution). Oncology, Palliative Care and Hospice services may have other suggestions for odor management.  In the home, dressing disposal into a diaper pail (used for disposable infant diapers) has been proven effective in similar situations. I discussed the situation with Dr. Dagoberto Ligas this evening and she indicated that Oncology will be coming to see the patient on Monday, 01/18/20 to discuss the patient's concerns regarding the right breast external presentation of her metastatic disease. PM&R and Oncology will also evaluate to see if there is an infectious process complicating the tumor's presentation.  Surgical consultation will be involved at their discretion. An Oncology Nurse Navigator may also be a valuable resource to this patient.  Tthe topical care dressing orders in place (xeroform) read for twice daily and PRN drainage strike-through on to exterior of dressing.   Thank you for this reconsultation. [place] nursing team will not follow, but will remain available to this patient, the nursing and her medical teams.  Please re-consult if needed.  Maudie Flakes, MSN, RN, [place], [facility]  Pager# [phone]

## 2020-01-16 NOTE — Progress Notes (Signed)
Physical Therapy Weekly Progress Note  Patient Details  Name: Misty Arnold MRN: [id number] Date of Birth: 25-Jun-1971  Beginning of progress report period: January 09, 2020 End of progress report period: January 16, 2020  Today's Date: 01/16/2020     Patient has met 5 of 5 short term goals.  Pt has made great progress over the past week. She currently requires Supervision assist for bed mobility and transfers with RW. She is independent with w/c mobility up to 500 ft with some cueing needed for management of w/c parts. Pt is able to ambulate up to 100 ft with use of RW and R AFO at CGA level with ongoing LE weakness and gait deviations noted. Pt is able to navigate 3" stairs with 2 handrails at min A level with B knee instability noted. Pt is on track to meet goals by d/c next week. Pt's mom has completed hands on family education and is safe to provide Supervision level assist for ambulation upon d/c home.  Patient continues to demonstrate the following deficits muscle weakness, decreased cardiorespiratoy endurance, abnormal tone, unbalanced muscle activation, ataxia and decreased coordination and decreased standing balance, decreased postural control and decreased balance strategies and therefore will continue to benefit from skilled PT intervention to increase functional independence with mobility.  Patient progressing toward long term goals overall, did decrease gait goal from mod I to Supervision due to decreased safety and independence with ambulation.  Continue plan of care.  PT Short Term Goals Week 1:  PT Short Term Goal 1 (Week 1): Pt will increase bed mobility to min A. PT Short Term Goal 1 - Progress (Week 1): Met PT Short Term Goal 2 (Week 1): Pt will increase transfers to c/g. PT Short Term Goal 2 - Progress (Week 1): Met PT Short Term Goal 3 (Week 1): Pt will propel w/c about 150 feet with mod I. PT Short Term Goal 3 - Progress (Week 1): Met PT Short Term Goal 4 (Week 1): Pt will  increase ambulation with LRAD about 100 feet with min A. PT Short Term Goal 4 - Progress (Week 1): Met PT Short Term Goal 5 (Week 1): Pt will ascend/descend 4 stairs with B rails and min A. PT Short Term Goal 5 - Progress (Week 1): Met Week 2:  PT Short Term Goal 1 (Week 2): =LTG due to ELOS   Therapy Documentation Precautions:  Precautions Precautions: Back Precaution Booklet Issued: No Precaution Comments: Reviewed back precautions.  Required Braces or Orthoses: Other Brace Other Brace: No brace needed per MD order Restrictions Weight Bearing Restrictions: No   Therapy/Group: Individual Therapy   Excell Seltzer, PT, DPT  01/16/2020, 7:32 AM

## 2020-01-16 NOTE — Plan of Care (Signed)
Downgraded gait goals from mod I level to Supervision due to decreased safety and balance with ambulation

## 2020-01-16 NOTE — Progress Notes (Signed)
Physical Therapy Session Note  Patient Details  Name: Misty Arnold MRN: [id number] Date of Birth: [birth year]  Today's Date: 01/16/2020 PT Individual Time: 0915-1000; 1300-1415 PT Individual Time Calculation (min): 45 min and 75 min  Short Term Goals: Week 1:  PT Short Term Goal 1 (Week 1): Pt will increase bed mobility to min A. PT Short Term Goal 2 (Week 1): Pt will increase transfers to c/g. PT Short Term Goal 3 (Week 1): Pt will propel w/c about 150 feet with mod I. PT Short Term Goal 4 (Week 1): Pt will increase ambulation with LRAD about 100 feet with min A. PT Short Term Goal 5 (Week 1): Pt will ascend/descend 4 stairs with B rails and min A.  Skilled Therapeutic Interventions/Progress Updates:    Session 1: Pt received seated in bed with RN re-dressing surgical wound. Pt agreeable to PT session. Pt reports some soreness in back this AM but reports pain is well-controlled. Bed mobility Supervision. Stand pivot transfer bed to w/c with RW and Supervision. Toilet transfer with Supervision and RW. Pt is setup A for sponge-bathing at sink. Standing balance with Supervision at sink while washing peri-area. Pt is setup A for UB and LB dressing. Pt is able to don TED hose and L shoe with setup A. Pt does require min A to don R shoe with AFO. Pt left seated in w/c in room with needs in reach at end of session.  Session 2: Pt received seated in bed with mom present for hands-on family education session. Pt is able to come to sitting EOB at Supervision level with use of BUE for BLE management. Sit to stand with Supervision to RW. Stand pivot transfer bed to w/c with RW at Supervision level. Toilet transfer with Supervision with use of RW. Pt is setup A for pericare, Supervision for standing balance while managing clothing. Pt is at mod I level for w/c mobility x 500 ft. Pt is able to ambulate x 80 ft with RW at [facility] level with use of R AFO. Discussed how to don/doff AFO, wearing schedule, and that  pt will be safer to have Supervision assist when ambulating upon d/c home. Otherwise anticipate that pt will be independent with transfers and at w/c level. Pt and her mom in agreement with therapy recommendations. Pt is able to propel her w/c up/down ramps and navigate functional spaces of elevators to go outdoors. Pt requires min cueing for how to safely navigate a w/c on/off an elevator. Ascend/descend 8 x 3" stairs with 2 handrails and min A, step-to gait pattern. Pt exhibits decreased B knee control with hyperextension when performing stairs. Pt is able to perform a car transfer from a low surface at min A level with use of RW. Per pt the car she will d/c home in is a truck with a running board. Discussed safe management in/out of truck with patient and her mom. Assisted pt back to bed at end of session. Pt is Supervision for sit to supine with use of BUE to manage BLE. Pt left seated in bed with needs in reach, mom present at end of session.   Therapy Documentation Precautions:  Precautions Precautions: Back Precaution Booklet Issued: No Precaution Comments: Reviewed back precautions.  Required Braces or Orthoses: Other Brace Other Brace: No brace needed per MD order Restrictions Weight Bearing Restrictions: No   Therapy/Group: Individual Therapy   Excell Seltzer, PT, DPT  01/16/2020, 12:38 PM

## 2020-01-16 NOTE — Progress Notes (Signed)
[place] PHYSICAL MEDICINE & REHABILITATION PROGRESS NOTE   Subjective/Complaints:  Pt is scared of what's going on with her wound on R chest- is draining purulent drainage- is changed dramatically since last seen.   Per speaking with Heme/OnC, yes this is due to mets, and needs radiation, HOWEVER also strongly suggested getting wound care nursing look at wound again   Wound care wrote a note today but does not appear actually have directly assessed pt- will call them to do so this weekend.   Pt was very clear and nursing are as well that lower mass was open and draining pus yesterday and things look very different.   Also has an odor now.  Pt's WBC is also going up- which is concerning.   ROS:  Pt denies SOB, abd pain, CP, N/V/C/D, and vision changes Upset esp this AM about R chest wounds.   Objective:   No results found. Recent Labs    01/14/20 0540  WBC 22.8*  HGB 9.7*  HCT 30.1*  PLT 531*   Recent Labs    01/14/20 0540  NA 138  K 4.2  CL 100  CO2 26  GLUCOSE 158*  BUN 19  CREATININE 0.56  CALCIUM 8.8*    Intake/Output Summary (Last 24 hours) at 01/16/2020 1835 Last data filed at 01/16/2020 1300 Gross per 24 hour  Intake 720 ml  Output --  Net 720 ml     Physical Exam: Vital Signs Blood pressure 134/80, pulse 95, temperature 98.7 F (37.1 C), resp. rate 18, height 5\' 5"  (1.651 m), weight 55.4 kg, SpO2 100 %. Labs and vitals reviewed General: pt appropriate, sitting up in bed; nursing at bedside; turned for chest wound to be changed, NAD HENT: different hair covering in place; conjugate gaze Neck: supple CV: RRR< no MR/G Respiratory: CTA b/l- good air movement GI: soft, NT, ND, (+) hypoactive BS Musculoskeletal:     Comments: No edema or tenderness in extremities  Neurological: She is alert.  Ox3 Motor:  LLE HF 4/5, KE 4/5, DF 3/5, PF 3/5 RLE- HF 2/5, KE 2/5, DF 0/5, PF 2/5 Has sensory line at T12 where sensation diminishes-  Skin: fungating  masses look much more draining- like draining purulent material, esp lower one- top one looks about to bust open, but not draining at this time.     Back incision has steristrips in place- across entire incision- no drainage- a little tiny bit of dried blood near top noted on steristrips. Otherwise, no erythema or issues  Psychiatric: calm, measured affect  Assessment/Plan: 1. 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  Physiatrist is providing close team supervision and 24 hour management of active medical problems listed below.  Physiatrist and rehab team continue to assess barriers to discharge/monitor patient progress toward functional and medical goals  Care Tool:  Bathing    Body parts bathed by patient: Right arm, Left arm, Chest, Abdomen, Front perineal area, Buttocks, Right upper leg, Left upper leg, Face, Right lower leg, Left lower leg   Body parts bathed by helper: Right lower leg, Left lower leg     Bathing assist Assist Level: Contact Guard/Touching assist     Upper Body Dressing/Undressing Upper body dressing   What is  the patient wearing?: Pull over shirt    Upper body assist Assist Level: Independent    Lower Body Dressing/Undressing Lower body dressing      What is the patient wearing?: Pants     Lower body assist Assist for lower body dressing: Contact Guard/Touching assist     Toileting Toileting    Toileting assist Assist for toileting: Contact Guard/Touching assist     Transfers Chair/bed transfer  Transfers assist     Chair/bed transfer assist level: Supervision/Verbal  cueing     Locomotion Ambulation   Ambulation assist      Assist level: Contact Guard/Touching assist Assistive device: Walker-rolling Max distance: 80'   Walk 10 feet activity   Assist     Assist level: Contact Guard/Touching assist Assistive device: Walker-rolling   Walk 50 feet activity   Assist Walk 50 feet with 2 turns activity did not occur: Safety/medical concerns  Assist level: Contact Guard/Touching assist Assistive device: Walker-rolling    Walk 150 feet activity   Assist Walk 150 feet activity did not occur: Safety/medical concerns         Walk 10 feet on uneven surface  activity   Assist Walk 10 feet on uneven surfaces activity did not occur: Safety/medical concerns         Wheelchair     Assist Will patient use wheelchair at discharge?: Yes Type of Wheelchair: Manual    Wheelchair assist level: Independent Max wheelchair distance: >131ft    Wheelchair 50 feet with 2 turns activity    Assist        Assist Level: Independent   Wheelchair 150 feet activity     Assist      Assist Level: Independent   Blood pressure 134/80, pulse 95, temperature 98.7 F (37.1 C), resp. rate 18, height 5\' 5"  (1.651 m), weight 55.4 kg, SpO2 100 %.  Medical Problem List and Plan: 1.  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  No back brace required.             -patient may not shower             -ELOS/Goals: Supervision/Min A             Continue CIR PT, OT  3/9- did ASIA exam today and determined pt level of injury T11 ASIA C/D  3/10- reassured pt Dr Trenton Gammon will come back if issues; otherwise with schedule F/U with him and Heme/Onc- no radiation/chemo until incision is  healed- that's Onc recs.   3/12- pt upset about "sight" of R chest masses/lesions from R breast CA- will see if can be seen by Neuropsychology.    3/13- spoke to [facility] [place]- pt's Oncologist back Monday- have asked wound care to see pt. They wrote a note, but did not see patient directly again- have attempted to page them to see if they can come back and see pt directly, since looking concerning- WBC ordered for AM since leukocytosis is worse- concerned might also be infected as well, since concern about purulent drainage.  2.  Antithrombotics: -DVT/anticoagulation: SCDs.  Dopplers ordered. Plan to discuss with neurosurgery question Lovenox 3/8- will need Lovenox as soon as OK by NSU due to cancer and SCI dx. 3/9- got approval to start Lovenox 40 mg daily- started  3/10- dopplers done yesterday- no  DVTs seen. 3/11- pt asked if could come off Lovenox explained can, but her risk of DVT is ?80% in 2 months- if walking, can use Lovenox x8 weeks from surgery- if not, 12 weeks. She voiced understanding and agreed to continue med.              -antiplatelet therapy: N/A 3. Pain Management: Oxycodone as needed, Valium as needed muscle spasms             Monitor with increased exertion. Well controlled.   3/7: continues to have pain but trying to rely less on oxycodone. Discussed scheduling Tylenol and she is agreeable.  3/8- pt trying to use tylenol as much as possible.    3/10- pain well controlled- at 2-3/10 after OT.   3/12- still taking oxycodone sometime,s but also intermixed with tylenol.  4. Mood: Provide emotional support             -antipsychotic agents: N/A 5. Neuropsych: This patient is capable of making decisions on her own behalf. 6. Skin/Wound Care: Routine skin checks 7. Fluids/Electrolytes/Nutrition: Routine in and outs.  CMP ordered for tomorrow.  8.  Neurogenic bowel/bladder.  PVRs ordered.  Establish bowel program  3/8- refused any bowel meds except miralax- insists voiding OK.   3/9-  daily BMs- 2 yesterday- no accidents- bladder- hasn't needed cathing- con't regimen 9.  History of metastatic breast cancer.  Current plans to follow-up outpatient radiation oncology Dr. Sondra Come as well as oncology service Dr. Lindi Adie to establish radiation therapy.  Patient's [facility] is currently.  3/9- pt upset about changes to fungating masses- what things look like-    3/12- pt uncomfortable with photo? However keeping a close monitor on R chest and appearance.   3/13- took phot today- looks much more concerning- labs for AM and Oncology and [place] called/consulte-d waiting for [place] to see pt directly.  10.  Acute blood loss anemia.  CBC ordered. Has not yet resulted.  3/8- Hb down to 8.6- was 9.0- will monitor  11.  Leukocytosis: Likely steroid-induced.  CBC ordered for tomorrow.   3/8- pt's WBC going up 19k- afebrile- denies Sx's of infection except sweating which can come from steroids- will monitor  3/10- labs Thursday  3/11- afebrile, but WBC up to 22.8k- will check U/A and Cx and CXR just to make sure not missing anything and recheck on Sunday.   3/12- no Sx's of UTI or resp issues- CXR and U/A (-) for infection.   3/13- labs for AM since pus coming from R chest masses.  12. Incontinence: Mrs. Bean was upset that she called to be taken to bathroom to urinate last night and fell asleep before anyone got to her, and that her dressing was soiled with urine this morning. She requests bedpan be placed by bedside in case she has future similar episode. Placed nursing order.   3/7: no similar issues overnight  3/8- concerned that incontinence can be from SCI- will need to do ASIA exam tomorrow. 13. Constipation  3/8- pt only willing to try miralax- encouraged to take today. Will see when goes/if goes.   3/9- improved- going regularly-  3/12- LBM this AM  I spent a total of 40 minutes on care today- consulted wound care- trying still to get them to see pt directly- also spoke at length with  Oncology- noted pt refused radiation in past but needs local radiation at this time.          LOS: 8 days  A FACE TO FACE EVALUATION WAS PERFORMED  Misty Arnold 01/16/2020, 6:35 PM

## 2020-01-17 NOTE — Progress Notes (Signed)
Dressing changed to Right chest at 2030. Most distal mass open with min.purulent drainage. Wound odor noticeable, upon entering room. Patient continues to voice concerns about change in chest wound and odor. "I don't want to go home until this is taken care of." Denies pain to right chest. PRN valium given at 2000. PRN Oxy IR given at 2025 & 2359, complains of mid to upper back pain. Rates pain a 7 on pain scale. Bilateral pedal edema. Reports numbness and tingling to feet and toes, not a new complaint.Misty Arnold A

## 2020-01-17 NOTE — Consult Note (Signed)
[facility] Nurse Consult Note: Reason for Consult: Reassessment of right breast wound.  Patient was premedicated for wound dressing change and I am assisted in the dressing change by the patient's bedside RN, Stacy. Wound type: Fungating breast lesion, neoplastic Pressure Injury POA: N/A Measurement: 17cm x 8cm with varying depths and irregularities Wound bed: The distal two-thirds of the tumor has 80% red/pink dry tissue. The 20% of the nonviable tissue that was previously yellow, has morphed into brown eschar since xeroform was initiated on 3/3.  The eschar has pulled away from the most distal enge of the tumor and reveals a 0.8cm x 3cm x 1.2cm defect.  The defect has been draining a purulent exudate in a small to moderate amount for two days. Also noted is a small, 1cm round area of eschar at the very most proximal portion of the tumor, in a crease at the axillae not noted on 01/06/20.This eschar is dry and stable.  Drainage (amount, consistency, odor) See above. Periwound: Irregular, bulbous tissues from the axillae to the ribcage/thoracic area. No periwound skin damage from exudate or autolytically debriding eschar onto the periwound skin. Dressing procedure/placement/frequency: The xeroform dressing is meeting the stated goals of non adherence and odor management. Additionally, as an astringent, it is drying the eschar. The moisture-retentive dressing is allowing for autolytic debridement and removal of the eschar. Twice daily dressing changes are still appropriate for patient comfort.  Today, I used a 8-inch x 1 inch ribbon of xeroform to place into the defect at 6 o'clock. A 6-inch "tail" of the dressing is left protruding from wound for ease in retrieval. The rest of the xeroform (3 packages) are placed over the tumor.  These are covered with and ABD and secured with Kerlix roll gauze. The roll gauze is topped with a 6-inch ACE bandage. The patient is conversive and participatory in the dressing change and  denies pain, stress related to odor or disruption in sleep-all previous complaints.  I will add filling of the defect with a ribbon of xeroform gauze to the POC.   Patient reports that she will go to [facility] tomorrow afternoon for a consultation with her Oncology team. I mention to her that they can perhaps provide her with a breast binder that will make dressing the tumor easier for her at home.    [facility] nursing team will not follow, but will remain available to this patient, the nursing and medical teams.  Please re-consult if needed. Thanks, Maudie Flakes, MSN, RN, [place], Arther Abbott  Pager# [phone]

## 2020-01-17 NOTE — Progress Notes (Signed)
[facility] PHYSICAL MEDICINE & REHABILITATION PROGRESS NOTE   Subjective/Complaints:  Pt 's labs somehow not on list- will reorder for tomorrow am  Pt and I discussed that refused radiation last time when spoke with Oncology- and explained R chest will NOT get better until sees Oncology and does radiation treatment- can do localized, not whole body, but it's necessary to improve skin issues.  Will also need to reassess QDay to QOD.  Have asked wound care to come back and assess the wounds themselves.    ROS:   Pt denies SOB, abd pain, CP, N/V/C/D, and vision changes    Objective:   No results found. No results for input(s): WBC, HGB, HCT, PLT in the last 72 hours. No results for input(s): NA, K, CL, CO2, GLUCOSE, BUN, CREATININE, CALCIUM in the last 72 hours.  Intake/Output Summary (Last 24 hours) at 01/17/2020 1419 Last data filed at 01/17/2020 1311 Gross per 24 hour  Intake 960 ml  Output -  Net 960 ml     Physical Exam: Vital Signs Blood pressure (!) 156/84, pulse 95, temperature 98.2 F (36.8 C), resp. rate 18, height 5\' 5"  (1.651 m), weight 55.4 kg, SpO2 100 %. Labs and vitals reviewed General: pt sitting on toilet trying to void, NAD HENT: EOMI B/L Neck: supple CV: no JVD Respiratory: no resp distress, no accessory muscle use; normal resp rate GI: Nondistended; soft Musculoskeletal:     Comments: No edema or tenderness in extremities  Neurological: She is alert.  Ox3 Motor:  LLE HF 4/5, KE 4/5, DF 3/5, PF 3/5 RLE- HF 2/5, KE 2/5, DF 0/5, PF 2/5 Has sensory line at T12 where sensation diminishes-  Skin: fungating masses look much more draining- like draining purulent material, esp lower one- top one looks about to bust open, but not draining at this time.  pic from 3/13    Back incision has steristrips in place- across entire incision- no drainage- a little tiny bit of dried blood near top noted on steristrips. Otherwise, no erythema or issues   Psychiatric: calm- but underneath, anxious about wound  Assessment/Plan: 1. 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  Physiatrist is providing close team supervision and 24 hour management of active medical problems listed below.  Physiatrist and rehab team continue to assess barriers to discharge/monitor patient progress toward functional and medical goals  Care Tool:  Bathing    Body parts bathed by patient: Right arm, Left arm, Chest, Abdomen, Front perineal area, Buttocks, Right upper leg, Left upper leg, Face, Right lower leg, Left lower leg   Body parts bathed by helper: Right lower leg, Left lower leg     Bathing assist Assist Level: Contact Guard/Touching assist     Upper Body Dressing/Undressing Upper body dressing   What is the patient wearing?: Pull over shirt    Upper body assist Assist Level: Independent    Lower Body Dressing/Undressing Lower body dressing      What is the patient wearing?: Pants     Lower body assist Assist for lower body dressing: Contact Guard/Touching assist     Toileting Toileting    Toileting assist Assist for toileting: Contact Guard/Touching assist     Transfers Chair/bed transfer  Transfers assist  Chair/bed transfer assist level: Supervision/Verbal cueing     Locomotion Ambulation   Ambulation assist      Assist level: Contact Guard/Touching assist Assistive device: Walker-rolling Max distance: 80'   Walk 10 feet activity   Assist     Assist level: Contact Guard/Touching assist Assistive device: Walker-rolling   Walk 50 feet  activity   Assist Walk 50 feet with 2 turns activity did not occur: Safety/medical concerns  Assist level: Contact Guard/Touching assist Assistive device: Walker-rolling    Walk 150 feet activity   Assist Walk 150 feet activity did not occur: Safety/medical concerns         Walk 10 feet on uneven surface  activity   Assist Walk 10 feet on uneven surfaces activity did not occur: Safety/medical concerns         Wheelchair     Assist Will patient use wheelchair at discharge?: Yes Type of Wheelchair: Manual    Wheelchair assist level: Independent Max wheelchair distance: >153ft    Wheelchair 50 feet with 2 turns activity    Assist        Assist Level: Independent   Wheelchair 150 feet activity     Assist      Assist Level: Independent   Blood pressure (!) 156/84, pulse 95, temperature 98.2 F (36.8 C), resp. rate 18, height 5\' 5"  (1.651 m), weight 55.4 kg, SpO2 100 %.  Medical Problem List and Plan: 1.  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  No back brace required.             -patient may not shower             -ELOS/Goals: Supervision/Min A             Continue CIR PT, OT  3/9- did ASIA exam today and determined pt level of injury T11 ASIA C/D  3/10- reassured pt Dr Trenton Gammon will come back if issues; otherwise with schedule F/U with him and Heme/Onc- no radiation/chemo until incision is healed- that's Onc recs.   3/12- pt upset about "sight" of R chest masses/lesions from R breast CA- will see if can be seen by Neuropsychology.    3/13- spoke to [facility]- pt's Oncologist back Monday- have asked wound care to see pt. They wrote a note, but did not see patient directly again- have  attempted to page them to see if they can come back and see pt directly, since looking concerning- WBC ordered for AM since leukocytosis is worse- concerned might also be infected as well, since concern about purulent drainage.  3/14- reordered labs for tomorrow since didn't get done- have reordered [place] consult since pt wasn't re-evaluated in person and wounds on R chest look a lot worse- Oncology specifically asked for pt to be seen directly. Attempted to page wound care nurse multiple times. Maybe she isn't on?  2.  Antithrombotics: -DVT/anticoagulation: SCDs.  Dopplers ordered. Plan to discuss with neurosurgery question Lovenox 3/8- will need Lovenox as soon as OK by NSU due to cancer and SCI dx. 3/9- got approval to start Lovenox 40 mg daily- started  3/10- dopplers done yesterday- no DVTs seen. 3/11- pt asked if could come off Lovenox explained can, but her risk of DVT is ?80% in 2 months- if walking, can use Lovenox x8 weeks from  surgery- if not, 12 weeks. She voiced understanding and agreed to continue med.              -antiplatelet therapy: N/A 3. Pain Management: Oxycodone as needed, Valium as needed muscle spasms             Monitor with increased exertion. Well controlled.   3/7: continues to have pain but trying to rely less on oxycodone. Discussed scheduling Tylenol and she is agreeable.  3/8- pt trying to use tylenol as much as possible.    3/10- pain well controlled- at 2-3/10 after OT.   3/12- still taking oxycodone sometime,s but also intermixed with tylenol.  3/14- taking oxy 1-2x/day usually;t ylenol otherwise  4. Mood: Provide emotional support             -antipsychotic agents: N/A 5. Neuropsych: This patient is capable of making decisions on her own behalf. 6. Skin/Wound Care: Routine skin checks 7. Fluids/Electrolytes/Nutrition: Routine in and outs.  CMP ordered for tomorrow.  8.  Neurogenic bowel/bladder.  PVRs ordered.  Establish bowel program  3/8- refused any bowel  meds except miralax- insists voiding OK.   3/9- daily BMs- 2 yesterday- no accidents- bladder- hasn't needed cathing- con't regimen 9.  History of metastatic breast cancer.  Current plans to follow-up outpatient radiation oncology Dr. Sondra Come as well as oncology service Dr. Lindi Adie to establish radiation therapy.  Patient's [facility] is currently.  3/9- pt upset about changes to fungating masses- what things look like-    3/12- pt uncomfortable with photo? However keeping a close monitor on R chest and appearance.   3/13- took phot today- looks much more concerning- labs for AM and Oncology and [place] called/consulte-d waiting for [place] to see pt directly.  10.  Acute blood loss anemia.  CBC ordered. Has not yet resulted.  3/8- Hb down to 8.6- was 9.0- will monitor  11.  Leukocytosis: Likely steroid-induced.  CBC ordered for tomorrow.   3/8- pt's WBC going up 19k- afebrile- denies Sx's of infection except sweating which can come from steroids- will monitor  3/10- labs Thursday  3/11- afebrile, but WBC up to 22.8k- will check U/A and Cx and CXR just to make sure not missing anything and recheck on Sunday.   3/12- no Sx's of UTI or resp issues- CXR and U/A (-) for infection.   3/13- labs for AM since pus coming from R chest masses.  3/14- labs in AM  12. Incontinence: Mrs. Carrigan was upset that she called to be taken to bathroom to urinate last night and fell asleep before anyone got to her, and that her dressing was soiled with urine this morning. She requests bedpan be placed by bedside in case she has future similar episode. Placed nursing order.   3/7: no similar issues overnight  3/8- concerned that incontinence can be from SCI- will need to do ASIA exam tomorrow.  3/14- pt sitting on toilet a long time- trying to void- wondering if retaining and needing to push- will d/w further once topic of R chest dealt with.  13. Constipation  3/8- pt only willing to try miralax- encouraged to take today. Will see  when goes/if goes.        LOS: 9 days A FACE TO FACE EVALUATION WAS PERFORMED  Misty Arnold 01/17/2020, 2:19 PM

## 2020-01-17 NOTE — Progress Notes (Signed)
Dressing changed to Right chest at 0900. Most distal mass continues to open with minimum purulent drainage. Wound order noticeable as old dressing is removed. Patient continues to voice concerns about change in chest mass and odor. Denies any pain. 10 mg Oxy IR given by previous shift nurse at [phone], now rates 2/10, and scheduled 650 mg Tylenol given. Patient has question about medication "Letrozole," and when she will begin taking it again. Dorthula Nettles, RN3

## 2020-01-18 ENCOUNTER — Inpatient Hospital Stay: Payer: Medicare PPO

## 2020-01-18 ENCOUNTER — Inpatient Hospital Stay (HOSPITAL_COMMUNITY): Payer: Medicare PPO

## 2020-01-18 ENCOUNTER — Inpatient Hospital Stay (HOSPITAL_COMMUNITY): Payer: Medicare PPO | Admitting: Physical Therapy

## 2020-01-18 ENCOUNTER — Inpatient Hospital Stay: Payer: Medicare PPO | Attending: Hematology and Oncology

## 2020-01-18 DIAGNOSIS — C7951 Secondary malignant neoplasm of bone: Secondary | ICD-10-CM | POA: Insufficient documentation

## 2020-01-18 DIAGNOSIS — Z9011 Acquired absence of right breast and nipple: Secondary | ICD-10-CM | POA: Insufficient documentation

## 2020-01-18 DIAGNOSIS — Z79811 Long term (current) use of aromatase inhibitors: Secondary | ICD-10-CM | POA: Insufficient documentation

## 2020-01-18 DIAGNOSIS — C50411 Malignant neoplasm of upper-outer quadrant of right female breast: Secondary | ICD-10-CM | POA: Insufficient documentation

## 2020-01-18 DIAGNOSIS — Z170 Estrogen receptor positive status [ER+]: Secondary | ICD-10-CM | POA: Insufficient documentation

## 2020-01-18 LAB — COMPREHENSIVE METABOLIC PANEL
ALT: 63 U/L — ABNORMAL HIGH (ref 0–44)
AST: 26 U/L (ref 15–41)
Albumin: 2.6 g/dL — ABNORMAL LOW (ref 3.5–5.0)
Alkaline Phosphatase: 67 U/L (ref 38–126)
Anion gap: 10 (ref 5–15)
BUN: 19 mg/dL (ref 6–20)
CO2: 28 mmol/L (ref 22–32)
Calcium: 8.5 mg/dL — ABNORMAL LOW (ref 8.9–10.3)
Chloride: 101 mmol/L (ref 98–111)
Creatinine, Ser: 0.55 mg/dL (ref 0.44–1.00)
GFR calc Af Amer: >60 mL/min (ref >60)
GFR calc non Af Amer: >60 mL/min (ref >60)
Glucose, Bld: 153 mg/dL — ABNORMAL HIGH (ref 70–99)
Potassium: 4.5 mmol/L (ref 3.5–5.1)
Sodium: 139 mmol/L (ref 135–145)
Total Bilirubin: 0.3 mg/dL (ref 0.3–1.2)
Total Protein: 4.7 g/dL — ABNORMAL LOW (ref 6.5–8.1)

## 2020-01-18 LAB — CBC WITH DIFFERENTIAL/PLATELET
Abs Immature Granulocytes: 0.89 10*3/uL — ABNORMAL HIGH (ref 0.00–0.07)
Basophils Absolute: 0 10*3/uL (ref 0.0–0.1)
Basophils Relative: 0 %
Eosinophils Absolute: 0 10*3/uL (ref 0.0–0.5)
Eosinophils Relative: 0 %
HCT: 29.9 % — ABNORMAL LOW (ref 36.0–46.0)
Hemoglobin: 9.6 g/dL — ABNORMAL LOW (ref 12.0–15.0)
Immature Granulocytes: 4 %
Lymphocytes Relative: 5 %
Lymphs Abs: 1.1 10*3/uL (ref 0.7–4.0)
MCH: 31.2 pg (ref 26.0–34.0)
MCHC: 32.1 g/dL (ref 30.0–36.0)
MCV: 97.1 fL (ref 80.0–100.0)
Monocytes Absolute: 0.8 10*3/uL (ref 0.1–1.0)
Monocytes Relative: 3 %
Neutro Abs: 21.3 10*3/uL — ABNORMAL HIGH (ref 1.7–7.7)
Neutrophils Relative %: 88 %
Platelets: 414 10*3/uL — ABNORMAL HIGH (ref 150–400)
RBC: 3.08 MIL/uL — ABNORMAL LOW (ref 3.87–5.11)
RDW: 17.2 % — ABNORMAL HIGH (ref 11.5–15.5)
WBC: 24.2 10*3/uL — ABNORMAL HIGH (ref 4.0–10.5)
nRBC: 0.2 % (ref 0.0–0.2)

## 2020-01-18 MED ORDER — LETROZOLE 2.5 MG PO TABS
2.5000 mg | ORAL_TABLET | Freq: Every day | ORAL | Status: DC
  Administered 2020-01-18 – 2020-01-20 (×3): 2.5 mg via ORAL
  Filled 2020-01-18 (×3): qty 1

## 2020-01-18 NOTE — Progress Notes (Addendum)
Patient ID: Misty Arnold, female   DOB: 01/08/1971, 49 y.o.   MRN: [id number]     Diagnosis codes: G82.22  Height:         5'5"      Weight:       122 lbs      Patient suffers from spinal cord injury (incomplete) and debility which impairs their ability to perform daily activities like bathing, dressing, toileting, and mobility in the home.  A rolling walker or cane  will help  Resolve some issue with performing activities of daily living.  A wheelchair will allow patient to safely perform daily activities.  Patient is not able to propel themselves in the home using a standard weight wheelchair due to spinal cord injury (incomplete) and weakness.  Patient can self propel in the lightweight wheelchair.

## 2020-01-18 NOTE — Progress Notes (Signed)
Occupational Therapy Session Note  Patient Details  Name: Misty Arnold MRN: [id number] Date of Birth: Jul 08, 1971  Today's Date: 01/18/2020 OT Individual Time: 0930-1040 OT Individual Time Calculation (min): 70 min    Short Term Goals: Week 2:  OT Short Term Goal 1 (Week 2): STG=LTG d/t ELOS  Skilled Therapeutic Interventions/Progress Updates:    Pt resting in w/c upon arrival.  Initial focus on practicing donning/doffing R shoe/AFO. Pt able to doff/don AFO and fasten shoe without assistance. Pt engaged in 10 mins (load 5) BLE therex on NuStep to strengthen BLE and increase standing balance/tolerance. Pt propelled w/c to gym and engaged in standing activity tossing/bouncing beach ball and 1kg ball against mini trampoline 20X3 with each ball.  Pt requires min A/CGA when BLE not resting against mat table (supervision when resting against mat table for support). Pt able to place and remove leg rest on w/c without assistance.  Pt propelled w/c back to room and remained seated.  All needs within reach and seat alarm activated.   Therapy Documentation Precautions:  Precautions Precautions: Back Precaution Booklet Issued: No Precaution Comments: Reviewed back precautions.  Required Braces or Orthoses: Other Brace Other Brace: No brace needed per MD order Restrictions Weight Bearing Restrictions: No  Pain:  Pt denies pain this morning   Therapy/Group: Individual Therapy  Leroy Libman 01/18/2020, 12:17 PM

## 2020-01-18 NOTE — Progress Notes (Addendum)
Social Work Patient ID: Misty Arnold, female   DOB: August 26, 1971, 49 y.o.   MRN: [id number]   SW met with pt in room to get address at d/c. Pt waiting on address and will follow-up with SW.   *[address], [place] [address]  Loralee Pacas, MSW, [place] Office: [phone] Cell: [phone] Fax: [phone]

## 2020-01-18 NOTE — Progress Notes (Signed)
[place] PHYSICAL MEDICINE & REHABILITATION PROGRESS NOTE   Subjective/Complaints: Mrs. Osterkamp has no complaints this morning. Denies pain, constipation.  Sleeping well at night. Has had no more incidents of incontinence without assistance at night. Has appointment at 2:30 today for labs at [facility] and for evaluation of her wound.   ROS: Pt denies SOB, abd pain, CP, N/V/C/D, and vision changes    Objective:   No results found. Recent Labs    01/18/20 0540  WBC 24.2*  HGB 9.6*  HCT 29.9*  PLT 414*   Recent Labs    01/18/20 0540  NA 139  K 4.5  CL 101  CO2 28  GLUCOSE 153*  BUN 19  CREATININE 0.55  CALCIUM 8.5*    Intake/Output Summary (Last 24 hours) at 01/18/2020 1528 Last data filed at 01/18/2020 1300 Gross per 24 hour  Intake 1320 ml  Output --  Net 1320 ml     Physical Exam: Vital Signs Blood pressure 137/84, pulse 99, temperature 98.3 F (36.8 C), temperature source Oral, resp. rate 18, height 5\' 5"  (1.651 m), weight 55.4 kg, SpO2 100 %. Labs and vitals reviewed General: pt sitting on toilet trying to void, NAD HENT: EOMI B/L Neck: supple CV: no JVD Respiratory: no resp distress, no accessory muscle use; normal resp rate GI: Nondistended; soft Musculoskeletal:     Comments: No edema or tenderness in extremities  Neurological: She is alert.  Ox3 Motor:  LLE HF 4/5, KE 4/5, DF 3/5, PF 3/5 RLE- HF 2/5, KE 2/5, DF 0/5, PF 2/5 Has sensory line at T12 where sensation diminishes-  Skin: fungating masses look much more draining- like draining purulent material, esp lower one- top one looks about to bust open, but not draining at this time.  pic from 3/13  On 3/15 has two yellow areas of purulent yellow drainage. Patient able to undress and dress herself.  Back incision has steristrips in place- across entire incision- no drainage- a little tiny bit of dried blood near top noted on steristrips. Otherwise, no erythema or issues  Psychiatric: calm- but  underneath, anxious about wound  Assessment/Plan: 1. 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  Physiatrist is providing close team supervision and 24 hour management of active medical problems listed below.  Physiatrist and rehab team continue to assess barriers to discharge/monitor patient progress toward functional and medical goals  Care Tool:  Bathing    Body parts bathed by patient: Right arm, Left arm, Chest, Abdomen, Front perineal area, Buttocks, Right upper leg, Left upper leg, Face, Right lower leg, Left lower leg   Body parts bathed by helper: Right lower leg, Left lower leg     Bathing assist Assist Level: Supervision/Verbal cueing     Upper Body Dressing/Undressing Upper body dressing   What is the patient wearing?: Pull over shirt    Upper body assist Assist Level: Independent with assistive device Assistive Device Comment: from w/c  Lower Body Dressing/Undressing Lower body dressing      What is the patient wearing?: Pants     Lower body assist Assist for lower body dressing: Contact Guard/Touching assist     Toileting Toileting    Toileting assist Assist for toileting: Contact Guard/Touching assist  Transfers Chair/bed transfer  Transfers assist     Chair/bed transfer assist level: Supervision/Verbal cueing     Locomotion Ambulation   Ambulation assist      Assist level: Contact Guard/Touching assist Assistive device: Walker-rolling Max distance: 50'   Walk 10 feet activity   Assist     Assist level: Contact Guard/Touching assist Assistive device:  Walker-rolling   Walk 50 feet activity   Assist Walk 50 feet with 2 turns activity did not occur: Safety/medical concerns  Assist level: Contact Guard/Touching assist Assistive device: Walker-rolling    Walk 150 feet activity   Assist Walk 150 feet activity did not occur: Safety/medical concerns         Walk 10 feet on uneven surface  activity   Assist Walk 10 feet on uneven surfaces activity did not occur: Safety/medical concerns         Wheelchair     Assist Will patient use wheelchair at discharge?: Yes Type of Wheelchair: Manual    Wheelchair assist level: Independent Max wheelchair distance: >125ft    Wheelchair 50 feet with 2 turns activity    Assist        Assist Level: Independent   Wheelchair 150 feet activity     Assist      Assist Level: Independent   Blood pressure 137/84, pulse 99, temperature 98.3 F (36.8 C), temperature source Oral, resp. rate 18, height 5\' 5"  (1.651 m), weight 55.4 kg, SpO2 100 %.  Medical Problem List and Plan: 1.  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  No back brace required.             -patient may not shower             -ELOS/Goals: Supervision/Min A             Continue CIR PT, OT  3/9- did ASIA exam today and determined pt level of injury T11 ASIA C/D  3/10- reassured pt Dr Trenton Gammon will come back if issues; otherwise with schedule F/U with him and Heme/Onc- no radiation/chemo until incision is healed- that's Onc recs.   3/12- pt upset about "sight" of R chest masses/lesions from R breast CA- will see if can be seen by Neuropsychology.   3/13- spoke to [facility]- pt's Oncologist back Monday- have asked wound care to see pt. They wrote a note,  but did not see patient directly again- have attempted to page them to see if they can come back and see pt directly, since looking concerning- WBC ordered for AM since leukocytosis is worse- concerned might also be infected as well, since concern about purulent drainage.  3/14- reordered labs for tomorrow since didn't get done- have reordered [place] consult since pt wasn't re-evaluated in person and wounds on R chest look a lot worse- Oncology specifically asked for pt to be seen directly. Attempted to page wound care nurse multiple times. Maybe she isn't on?   3/15: Agree with Dr. Florentina Jenny concerns for infection given purulent drainage as well as increasing luekocytosis. Patient has appointment today at [facility] for repeat labs and wound care evaluation.  2.  Antithrombotics: -DVT/anticoagulation: SCDs.  Dopplers ordered. Plan to discuss with neurosurgery question Lovenox 3/8- will need Lovenox as soon as OK by NSU due to cancer and SCI dx. 3/9- got approval  to start Lovenox 40 mg daily- started  3/10- dopplers done yesterday- no DVTs seen. 3/11- pt asked if could come off Lovenox explained can, but her risk of DVT is ?80% in 2 months- if walking, can use Lovenox x8 weeks from surgery- if not, 12 weeks. She voiced understanding and agreed to continue med.              -antiplatelet therapy: N/A 3. Pain Management: Oxycodone as needed, Valium as needed muscle spasms             Monitor with increased exertion. Well controlled.   3/7: continues to have pain but trying to rely less on oxycodone. Discussed scheduling Tylenol and she is agreeable.  3/8- pt trying to use tylenol as much as possible.    3/10- pain well controlled- at 2-3/10 after OT.   3/12- still taking oxycodone sometime,s but also intermixed with tylenol.  3/14- taking oxy 1-2x/day usually;t ylenol otherwise   3/15: well controlled 4. Mood: Provide emotional support             -antipsychotic agents: N/A 5. Neuropsych: This patient  is capable of making decisions on her own behalf. 6. Skin/Wound Care: Routine skin checks 7. Fluids/Electrolytes/Nutrition: Routine in and outs.  CMP ordered for tomorrow.  8.  Neurogenic bowel/bladder.  PVRs ordered.  Establish bowel program  3/8- refused any bowel meds except miralax- insists voiding OK.   3/9- daily BMs- 2 yesterday- no accidents- bladder- hasn't needed cathing- con't regimen 9.  History of metastatic breast cancer.  Current plans to follow-up outpatient radiation oncology Dr. Sondra Come as well as oncology service Dr. Lindi Adie to establish radiation therapy.  Patient's [facility] Home is currently.  3/9- pt upset about changes to fungating masses- what things look like-    3/12- pt uncomfortable with photo? However keeping a close monitor on R chest and appearance.   3/13- took phot today- looks much more concerning- labs for AM and Oncology and [place] called/consulte-d waiting for [place] to see pt directly.  10.  Acute blood loss anemia.  CBC ordered. Has not yet resulted.  3/8- Hb down to 8.6- was 9.0- will monitor  11.  Leukocytosis: Likely steroid-induced.  CBC ordered for tomorrow.   3/8- pt's WBC going up 19k- afebrile- denies Sx's of infection except sweating which can come from steroids- will monitor  3/10- labs Thursday  3/11- afebrile, but WBC up to 22.8k- will check U/A and Cx and CXR just to make sure not missing anything and recheck on Sunday.   3/12- no Sx's of UTI or resp issues- CXR and U/A (-) for infection.   3/13- labs for AM since pus coming from R chest masses.  3/14- labs in AM   3/15: leukocytosis increasing. Continue to trend. Ordered repeat for tomorrow AM.  12. Incontinence: Mrs. Kerner was upset that she called to be taken to bathroom to urinate last night and fell asleep before anyone got to her, and that her dressing was soiled with urine this morning. She requests bedpan be placed by bedside in case she has future similar episode. Placed nursing order.   3/7: no  similar issues overnight  3/8- concerned that incontinence can be from SCI- will need to do ASIA exam tomorrow.  3/14- pt sitting on toilet a long time- trying to void- wondering if retaining and needing to push- will d/w further once topic of R chest dealt with.  13. Constipation  3/8- pt only willing to try miralax- encouraged  to take today. Will see when goes/if goes.        LOS: 10 days A FACE TO FACE EVALUATION WAS PERFORMED  Clide Deutscher Laquasha Groome 01/18/2020, 3:28 PM

## 2020-01-18 NOTE — Discharge Summary (Signed)
Physician Discharge Summary  Patient ID: Misty Arnold MRN: [id number] DOB/AGE: 06-24-1971 49 y.o.  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 T7-10 instrumentation. EXAM: THORACIC SPINE 2 VIEWS; DG C-ARM 1-60 MIN COMPARISON:  01/04/2020 MRI thoracic spine. FLUOROSCOPY TIME:  Fluoroscopy Time:  1 minutes 0 seconds Number of Acquired Spot Images: 2 FINDINGS: Nondiagnostic AP and lateral intraoperative fluoroscopic thoracic spine radiographs demonstrate placement of bilateral pedicle screws at multiple thoracic spine levels, numbering of which is not possible on these coned views. Temperature probe noted in the thoracic esophagus. IMPRESSION: Intraoperative fluoroscopic guidance for thoracic spine surgery. Electronically Signed   By: Ilona Sorrel M.D.   On: 01/04/2020 16:40   MR THORACIC SPINE W WO CONTRAST  Result Date: 01/04/2020 CLINICAL DATA:  Progressive sensation and weakness deficit in bilateral lower extremity right greater than left with saddle anesthesia. 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 EXAM: MRI THORACIC WITHOUT AND WITH CONTRAST TECHNIQUE: Multiplanar and multiecho pulse sequences of the thoracic spine were obtained without and with intravenous contrast. CONTRAST:  4.61mL GADAVIST GADOBUTROL 1 MMOL/ML IV SOLN COMPARISON:  CT of the chest August 21, 2019 FINDINGS: MRI THORACIC SPINE FINDINGS Alignment:  Physiologic. Correlation with prior CT show 12 rib-bearing vertebrae. The first rib  bearing vertebra shows rudimentary ribs and is named T1 in this study, consistent with prior study numbering. Per this counting, there would be 6 cervical, 12 thoracic and 5 lumber type vertebrae. Careful correlation with this numbering strategy prior to any procedural intervention would be recommended. Vertebrae: There has been significant interval growth of the mass lesion involving the T8 and T9 vertebrae corresponding to known metastatic disease. The expansile lesion involves most of the T8 and T9 vertebral body including pedicles and posterior elements extending into the spinal canal causing severe spinal canal stenosis and compression of the spinal cord which shows increased signal from the level of T8 through T10. There is also extension of the lesion to the ribcage on the right side, from T7 through T9, with severe narrowing of the right neural foramen from T7-8 through T9-10. Cord: Cord compression with increased T2 signal from T8 through T10, consistent with compressive myelopathy. Paraspinal and other soft tissues: Incidentally noted and partially the study is expansile mass lesion in the anterior chest wall centered in the sternum, measuring approximately 7 x 5 x 3 cm, consistent with known metastatic lesion. Multiple small T2 hyperintense lesions in the liver are incidentally noted. Disc levels: Severe spinal canal stenosis with cord compression related to expansile lesion described above at the T8-T9 level with severe right-sided neural foraminal stenosis from T7 through T9. No significant disc bulge or herniation, spinal canal or neural foraminal stenosis in the other levels. IMPRESSION: 1. Please note this counting strategy prior to any procedural intervention would be recommended. 2. Significant interval growth of mass lesion involving most of the T8 and T9 vertebrae including pedicles and posterior elements extending into the spinal canal causing severe spinal canal stenosis with cord compression  and compressive myelopathy at  the T8-T10 levels. 3. Expansile mass in the anterior chest wall centered in the sternum, consistent with known metastatic lesion. Communicated to [employer] PA at 10:20 a.m. on 01/04/2020. Electronically Signed   By: Pedro Earls M.D.   On: 01/04/2020 10:25   MR LUMBAR SPINE W WO CONTRAST  Result Date: 01/04/2020 CLINICAL DATA:  Progressive neurological deficit in the bilateral lower extremity right greater than left. Question of chronic spinal compression. 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 EXAM: MRI LUMBAR SPINE WITHOUT AND WITH CONTRAST TECHNIQUE: Multiplanar and multiecho pulse sequences of the lumbar spine were obtained without and with intravenous contrast. CONTRAST:  4.16mL GADAVIST GADOBUTROL 1 MMOL/ML IV SOLN COMPARISON:  CT of the abdomen August 21 2019 FINDINGS: Segmentation:  Standard. Alignment:  Physiologic. Vertebrae: No fracture, evidence of discitis, or bone lesion. 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 Conus medullaris and cauda equina: Conus extends to the L2 level. Conus and cauda equina appear normal. Paraspinal and other soft tissues: Negative. Disc levels: T12-L1 through L3-4: No spinal canal or neural foraminal stenosis. L4-5: Shallow disc bulge with superimposed small left subarticular/foraminal disc protrusion causing mild narrowing of the left neural foramen and left subarticular zone. L5-S1: No spinal canal or neural foraminal stenosis. IMPRESSION: 1. No cauda equina compression or abnormal contrast enhancement. 2. 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 3. Mild  degenerative disc disease at L4-L5 with mild narrowing of the left neural foramen and left subarticular zone. No high-grade spinal canal or neural foraminal stenosis. Electronically Signed   By: Pedro Earls M.D.   On: 01/04/2020 09:37   DG CHEST PORT 1 VIEW  Result Date: 01/14/2020 CLINICAL DATA:  Leukocytosis. EXAM: PORTABLE CHEST 1 VIEW COMPARISON:  None. FINDINGS: The heart size and mediastinal contours are within normal limits. Both lungs are clear. No pneumothorax or pleural effusion is noted. Status post surgical internal fixation of the thoracic spine. IMPRESSION: No active disease. Electronically Signed   By: Marijo Conception M.D.   On: 01/14/2020 11:53   DG C-Arm 1-60 Min  Result Date: 01/04/2020 CLINICAL DATA:  T8-9 laminectomy for tumor. T7-10 instrumentation. EXAM: THORACIC SPINE 2 VIEWS; DG C-ARM 1-60 MIN COMPARISON:  01/04/2020 MRI thoracic spine. FLUOROSCOPY TIME:  Fluoroscopy Time:  1 minutes 0 seconds Number of Acquired Spot Images: 2 FINDINGS: Nondiagnostic AP and lateral intraoperative fluoroscopic thoracic spine radiographs demonstrate placement of bilateral pedicle screws at multiple thoracic spine levels, numbering of which is not possible on these coned views. Temperature probe noted in the thoracic esophagus. IMPRESSION: Intraoperative fluoroscopic guidance for thoracic spine surgery. Electronically Signed   By: Ilona Sorrel M.D.   On: 01/04/2020 16:40   VAS [name] LOWER EXTREMITY VENOUS (DVT)  Result Date: 01/12/2020  Lower Venous DVTStudy Indications: Pain.  Performing Technologist: Antonieta Pert RDMS, RVT  Examination Guidelines: A complete evaluation includes B-mode imaging, spectral Doppler, color Doppler, and power Doppler as needed of all accessible portions of each vessel. Bilateral testing is considered an integral part of a complete examination. Limited examinations for reoccurring indications may be performed as noted. The reflux portion of the exam is  performed with the patient in reverse  Trendelenburg.  +---------+---------------+---------+-----------+----------+--------------+ RIGHT    CompressibilityPhasicitySpontaneityPropertiesThrombus Aging +---------+---------------+---------+-----------+----------+--------------+ CFV      Full           Yes      Yes                                 +---------+---------------+---------+-----------+----------+--------------+ SFJ      Full                                                        +---------+---------------+---------+-----------+----------+--------------+ FV Prox  Full                                                        +---------+---------------+---------+-----------+----------+--------------+ FV Mid   Full                                                        +---------+---------------+---------+-----------+----------+--------------+ FV DistalFull                                                        +---------+---------------+---------+-----------+----------+--------------+ PFV      Full                                                        +---------+---------------+---------+-----------+----------+--------------+ POP      Full           Yes      Yes                                 +---------+---------------+---------+-----------+----------+--------------+ PTV      Full                                                        +---------+---------------+---------+-----------+----------+--------------+ PERO     Full                                                        +---------+---------------+---------+-----------+----------+--------------+ GSV      Full                                                        +---------+---------------+---------+-----------+----------+--------------+   +---------+---------------+---------+-----------+----------+--------------+  LEFT     CompressibilityPhasicitySpontaneityPropertiesThrombus  Aging +---------+---------------+---------+-----------+----------+--------------+ CFV      Full           Yes      Yes                                 +---------+---------------+---------+-----------+----------+--------------+ SFJ      Full                                                        +---------+---------------+---------+-----------+----------+--------------+ FV Prox  Full                                                        +---------+---------------+---------+-----------+----------+--------------+ FV Mid   Full                                                        +---------+---------------+---------+-----------+----------+--------------+ FV DistalFull                                                        +---------+---------------+---------+-----------+----------+--------------+ PFV      Full                                                        +---------+---------------+---------+-----------+----------+--------------+ POP      Full           Yes      Yes                                 +---------+---------------+---------+-----------+----------+--------------+ PTV      Full                                                        +---------+---------------+---------+-----------+----------+--------------+ PERO     Full                                                        +---------+---------------+---------+-----------+----------+--------------+ GSV      Full                                                        +---------+---------------+---------+-----------+----------+--------------+  Summary: RIGHT: - There is no evidence of deep vein thrombosis in the lower extremity.  - No cystic structure found in the popliteal fossa.  LEFT: - There is no evidence of deep vein thrombosis in the lower extremity.  - No cystic structure found in the popliteal fossa.  *See table(s) above for measurements and observations. Electronically  signed by Harold Barban MD on 01/12/2020 at 5:48:42 PM.    Final     Labs:  Basic Metabolic Panel: Recent Labs  Lab 01/14/20 0540 01/18/20 0540  NA 138 139  K 4.2 4.5  CL 100 101  CO2 26 28  GLUCOSE 158* 153*  BUN 19 19  CREATININE 0.56 0.55  CALCIUM 8.8* 8.5*    CBC: Recent Labs  Lab 01/14/20 0540 01/18/20 0540 01/19/20 0501  WBC 22.8* 24.2* 23.5*  NEUTROABS 18.5* 21.3* 20.7*  HGB 9.7* 9.6* 10.1*  HCT 30.1* 29.9* 31.3*  MCV 94.4 97.1 96.6  PLT 531* 414* 427*    CBG: No results for input(s): GLUCAP in the last 168 hours.  Family history.  Maternal aunt with breast cancer, CVA, CAD, maternal grandfather with colon cancer, CVA.  Mother with CVA, myocardial infarction, diabetes mellitus, hyperlipidemia.  Father with CVA as well as diabetes mellitus.  Denies any rectal cancer or esophageal cancer  Brief HPI:   Misty Arnold is a 49 y.o. right-handed female with history of metastatic breast cancer diagnosed November 2011 with mastectomy 08/29/2015 followed by reconstruction followed by Dr. Lindi Adie and has been maintained on Zoladex and letrozole and later changed Imbrance.  History taken from chart review.  Patient had previously been  recommended for radiation therapy for palliation but she initially declined.  She presented on 01/04/2020 with increasing back pain as well as lower extremity weakness.  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  She underwent T7-8-9-T10 decompressive laminectomy with resection of tumor including right T9 transpedicular decompression with resection of tumor segmental pedicle screw fixation on 01/04/2020 per Dr. Annette Stable.  No back brace required.  Pathology report consistent with metastatic poorly differentiated carcinoma.  Follow-up radiation oncology services plan for radiation therapy to begin 2 to 3 weeks after recovery from recent back surgery.  Hospital course complicated by acute blood loss anemia  and leukocytosis.  Maintained on a regular diet.  Therapy evaluations completed and patient was admitted for a comprehensive rehab program   Hospital Course: Misty Arnold was admitted to rehab 01/08/2020 for inpatient therapies to consist of PT, ST and OT at least three hours five days a week. Past admission physiatrist, therapy team and rehab RN have worked together to provide customized collaborative inpatient rehab.  Pertaining to patient's T11 paraplegia [place] C/D secondary to T8-9 metastatic tumor epidural extension severe myelopathy she had undergone decompressive laminectomy 01/04/2020 per Dr. Annette Stable no back brace required.  Right breast wound incision monitored closely there was some purulent yellow drainage she remained afebrile although she did have some leukocytosis 24,200 which was hard to monitor as patient was maintained on Decadron.  Plan was to follow-up outpatient radiation oncology as well as Dr. Lindi Adie and await next plan for Zoladex treatment.  Subcutaneous Lovenox for DVT prophylaxis venous Doppler studies negative.  Patient was very reluctant to continue Lovenox it was explained at length the risk for DVT due to the fact she was essentially nonambulatory she would continue for 8-12 weeks total from day of surgery.  Pain managed use of oxycodone as well as Valium as  needed.  Neurogenic bowel and bladder bowel program was established she refused bowel meds at times she has not needed catheterizations.  Acute blood loss anemia latest hemoglobin 9.6.   Blood pressures were monitored on TID basis and controlled  Misty Arnold is continent of bowel and bladder.  Misty Arnold has made gains during rehab stay and is attending therapies  Misty Arnold will continue to receive follow up therapies   after discharge  Rehab course: During patient's stay in rehab weekly team conferences were held to monitor patient's progress, set goals and discuss barriers to discharge. At admission, patient required moderate assist  ambulate 70 feet rolling walker, min mod assist stand pivot transfers, moderate assist side-lying to sitting.  Minimal assist upper body bathing max is lower body bathing minimal assist upper body dressing max assist lower body dressing  Physical exam.  Blood pressure 127/81 pulse 69 temperature 98.4 respirations 18 oxygen saturations 100% room air Constitutional.  Alert frail female HEENT Head.  Normocephalic and atraumatic Eyes.  Pupils round and reactive to light no discharge without nystagmus Neck.  Supple nontender no tracheal deviation Respiratory effort normal no respiratory distress without wheeze GI.  Soft nontender positive bowel sounds without rebound Cardiac regular rate rhythm without any extra sounds or murmur heard Musculoskeletal no edema or tenderness extremities Neurological.  Alert and oriented Motor.  Bilateral upper extremities 4 - 4+ out of 5 proximal distal Right lower extremity flexion extension 4- out of 5 ankle dorsiflexion 2 out of 5 Left lower extremity flexion knee extension 4+ out of 5 ankle dorsiflexion 4 out of 5   /She  has had improvement in activity tolerance, balance, postural control as well as ability to compensate for deficits. Misty Arnold has had improvement in functional use RUE/LUE  and RLE/LLE as well as improvement in awareness.  Working with energy conservation techniques.  Patient modified independent level for wheelchair mobility and management of parts.  Sit to stand with supervision to rolling walker.  Transfers to mat table rolling walker supervision.  She was fitted with an AFO brace.  Patient exhibits some decreased control of left quad compared to right quad.  Supervision bed mobility.  She is able to ambulate 100 feet with use of rolling walker and right AFO brace.  Ambulates from edge of bed bathroom rolling walker contact-guard assist for balance to complete toileting task and ADLs.  Full family teaching completed plan discharge to home        Disposition: Discharge to home    Diet: Regular  Special Instructions: No driving smoking or alcohol  Follow-up Dr. Sondra Come for planned radiation therapy    Wound care.  Routine 2 times a day right mastectomy site cleansed with normal saline pat gently dry.  Fill the defect at the most distal aspect of the tumor with a 1 inch ribbon of Xeroform leaving a tail protruding for ease and retrieval.  Cover the defect and the remainder of the wound with Xeroform gauze pieces.  Top with an ABD pad, secure with Kerlix roll gauze, top with 6 inch Ace bandage.  Change twice daily and as needed drainage strikethrough onto exterior of dressing.  Medications at discharge. 1.  Tylenol as needed 2.  Norvasc 10 mg p.o. daily 3.  Vitamin C 250 mg p.o. daily 4.  Dulcolax suppository daily as needed for moderate constipation 5.  Os-Cal with D1 tablet p.o. twice daily 6.  Vitamin D3 5000 units p.o. twice daily 7.  Decadron taper as directed 8.  Valium  5 to 10 mg every 6 hours as needed muscle spasms 9.  Lovenox 40 mg every 24 hours until 02/22/2020 10.  Femera 2.5 mg p.o. daily 11.  Oxycodone 10 mg every 3 hours as needed pain 12.  MiraLAX daily as needed  Discharge Instructions    Ambulatory referral to Physical Medicine Rehab   Complete by: As directed    Moderate complexity follow-up 1 to 2 weeks paraparesis/T8-9 metastatic tumor      Follow-up Information    Lovorn, Jinny Blossom, MD Follow up.   Specialty: Physical Medicine and Rehabilitation Why: Office to call for appointment Contact information: [id number] [place] [address] [phone]        [name], MD Follow up.   Specialty: Neurosurgery Why: Call for appointment Contact information: [address] [place] [address] [phone]        Gery Pray, MD Follow up.   Specialty: Radiation Oncology Why: Call for appointment Contact information: [place]  [phone]        Nicholas Lose, MD Follow up.   Specialty: Hematology and Oncology Why: Call for appointment Contact information: [place] [phone]           Signed: Cathlyn Parsons 01/20/2020, 5:28 AM

## 2020-01-18 NOTE — Progress Notes (Signed)
Slept good. PRN oxy IR 10mg 's & valium given at 2117 and 0337, per patient's request. Denies pain to right chest wound area. Mostly complains of pain to back. Steris in place to back, no drainage. Right chest wound dressing changed by [facility] nurse at 1700. Bilateral pedal edema and sacral edema. Seemed to drag right foot, more than previous night,  when ambulating to BR. Using BSC during night. Misty Arnold A

## 2020-01-18 NOTE — Progress Notes (Signed)
Physical Therapy Session Note  Patient Details  Name: Misty Arnold MRN: [id number] Date of Birth: November 07, 1970  Today's Date: 01/18/2020 PT Individual Time: 1315-1410 PT Individual Time Calculation (min): 55 min   Short Term Goals: Week 2:  PT Short Term Goal 1 (Week 2): =LTG due to ELOS  Skilled Therapeutic Interventions/Progress Updates:    Pt received seated in w/c in room having just received lunch. Returned after 15 min to allow pt time to finish lunch. Pt agreeable to therapy session, reports pain in back at 1/10. Pt is at mod I level for w/c mobility and management of w/c parts x 150 ft. Sit to stand with Supervision to RW. Transfer to mat table with RW and Supervision. Theodis Aguas, [facility] present during therapy session for AFO assessment. Trial gait with and without use of R PLS AFO. Pt exhibits R foot drop and toe drag without use of AFO, improved with use of AFO with some ongoing toe catching. Orthotist recommending use of R PLS AFO as well as a R toe cap. Pt also continues to exhibit ongoing B knee hyperextension during gait but occasional enough to not require bracing at this time. Remainder of session focus on BLE NMR. Standing TKE x 15-20 reps B with use of yellow theraband, use of mirror for visual feedback. Pt exhibits decreased control of L quad as compared to R quad. Standing mini-squats 2 x 30 reps to fatigue with RW and mirror for visual feedback, min cueing for L knee control and symmetrical movements. Pt exhibits increased control of L knee as exercise progresses. Standing alt L/R 1" step-taps with RW and min A for balance with manual cueing to prevent L knee hyperextension in stance and R hip circumduction when lifting up to step. Pt requests to use the bathroom at end of session, Supervision for toilet transfer with RW. Setup A for pericare and min A to pull pants back over hips. See Flowsheet for details. Assisted pt back to bed at end of session. Pt is Supervision for bed  mobility with use of UE to assist BLE onto the bed. Pt left semi-reclined in bed with needs in reach at end of session.  Therapy Documentation Precautions:  Precautions Precautions: Back Precaution Booklet Issued: No Precaution Comments: Reviewed back precautions.  Required Braces or Orthoses: Other Brace Other Brace: No brace needed per MD order Restrictions Weight Bearing Restrictions: No    Therapy/Group: Individual Therapy   Excell Seltzer, PT, DPT  01/18/2020, 2:19 PM

## 2020-01-18 NOTE — Progress Notes (Addendum)
HEMATOLOGY-ONCOLOGY PROGRESS NOTE  SUBJECTIVE: The patient is sitting up and eating today at the time my visit.  She states that she is having increased drainage to her breast mass over the past few days.  States that she thinks some of the tissue sloughed off.  She is not having any pain at that area.  Denies fevers and chills.  The patient was on letrozole 2.5 mg daily prior to admission but is not currently taking this.  She was supposed to start Ibrance 100 mg daily prior to her admission, but she never started this.   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 But she could not afford these trips.   01/08/2014 Breast MRI   right breast multiple enhancing masses within the soft tissues largest 5.6 cm in wall skin surface and the capsule of the silicone prosthesis, contiguous nodules involving in inferomedial breast and tired and subcentimeter nodules across the midline   08/29/2015 Surgery   Right mastectomy: IDC with invol of skin and skin ulceration, breast capsule inv cancer, superior  medial margin positive, 1/1 LN positive, grade 3, 14.3 cm, 3.5 cm, ALI, chest wall involv, ER 90-100%, PR 80-90%, HER-2 negative, Ki 67 40-50% T4CN1 (St 3B)   03/05/2016 -  Anti-estrogen oral therapy   Tamoxifen 20 mg daily stopped due to headache and uncontrolled hypertension. Decrease to 10 mg daily 04/03/2016, stopped June 2017 and took an estrogen metabolizer over-the-counter; started Aromasin April 2018 from a [place] physician, Zoladex started on 05/2017 and switched to Letrozole in 09/2017      REVIEW OF SYSTEMS:   Constitutional: Denies fevers, chills  Respiratory: Denies cough, dyspnea or wheezes Cardiovascular: Denies palpitation, chest discomfort Gastrointestinal:  Denies nausea, heartburn or change in bowel habits Skin: Right chest wall mass with increased drainage, but no pain Lymphatics: Denies new lymphadenopathy or easy bruising Neurological:Denies numbness, tingling or new weaknesses Behavioral/Psych: Mood is stable, no new changes  Extremities: No lower extremity edema All other systems were reviewed with the patient and are negative.  I have reviewed the past medical history, past surgical history, social history and family history with the patient and they are unchanged from previous note.   PHYSICAL EXAMINATION: ECOG PERFORMANCE STATUS: 2 - Symptomatic, <50% confined to bed  Vitals:   01/18/20 0333 01/18/20 1454  BP: (!) 153/95 137/84  Pulse: 89 99  Resp: 18 18  Temp: 99.3 F (37.4 C) 98.3 F (36.8 C)  SpO2: 100% 100%   Filed Weights   01/08/20 1621  Weight: 55.4 kg    Intake/Output from previous day: 03/14 0701 - 03/15 0700 In: 1200 [P.O.:1200] Out: -   GENERAL:alert,  no distress and comfortable SKIN: Right chest wall mass not visualized as it was already dressed  NEURO: alert & oriented x 3 with fluent speech, no focal motor/sensory deficits  LABORATORY DATA:  I have reviewed the data as listed CMP Latest Ref Rng & Units 01/18/2020 01/14/2020  01/11/2020  Glucose 70 - 99 mg/dL 153(H) 158(H) 151(H)  BUN 6 - 20 mg/dL 19 19 15   Creatinine 0.44 - 1.00 mg/dL 0.55 0.56 0.57  Sodium 135 - 145 mmol/L 139 138 138  Potassium 3.5 - 5.1 mmol/L 4.5 4.2 4.1  Chloride 98 - 111 mmol/L 101 100 104  CO2 22 - 32 mmol/L 28 26 29   Calcium 8.9 - 10.3 mg/dL 8.5(L) 8.8(L) 8.6(L)  Total Protein 6.5 - 8.1 g/dL 4.7(L) - 4.9(L)  Total Bilirubin 0.3 - 1.2 mg/dL 0.3 - 0.4  Alkaline Phos 38 - 126 U/L 67 - 67  AST 15 - 41 U/L 26 - 32  ALT 0 - 44 U/L 63(H) - 67(H)    Lab Results  Component Value Date   WBC 24.2 (H) 01/18/2020   HGB 9.6 (L) 01/18/2020   HCT 29.9 (L) 01/18/2020   MCV 97.1 01/18/2020   PLT 414 (H) 01/18/2020   NEUTROABS 21.3 (H) 01/18/2020    DG Thoracic Spine 2 View  Result Date: 01/04/2020 CLINICAL DATA:  T8-9 laminectomy for tumor. T7-10 instrumentation. EXAM: THORACIC SPINE 2 VIEWS; DG C-ARM 1-60 MIN COMPARISON:  01/04/2020 MRI thoracic spine. FLUOROSCOPY TIME:  Fluoroscopy Time:  1 minutes 0 seconds Number of Acquired Spot Images: 2 FINDINGS: Nondiagnostic AP and lateral intraoperative fluoroscopic thoracic spine radiographs demonstrate placement of bilateral pedicle screws at multiple thoracic spine levels, numbering of which is not possible on these coned views. Temperature probe noted in the thoracic esophagus. IMPRESSION: Intraoperative fluoroscopic guidance for thoracic spine surgery. Electronically Signed   By: Ilona Sorrel M.D.   On: 01/04/2020 16:40   MR THORACIC SPINE W WO CONTRAST  Result Date: 01/04/2020 CLINICAL DATA:  Progressive sensation and weakness deficit in bilateral lower extremity right greater than left with saddle anesthesia. 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 EXAM: MRI THORACIC WITHOUT AND WITH CONTRAST TECHNIQUE: Multiplanar and multiecho pulse sequences of the thoracic spine were obtained without and with intravenous contrast. CONTRAST:  4.20m GADAVIST GADOBUTROL 1 MMOL/ML IV SOLN COMPARISON:  CT of the chest August 21, 2019 FINDINGS: MRI THORACIC SPINE FINDINGS Alignment:  Physiologic. Correlation with prior CT show 12 rib-bearing vertebrae. The first rib bearing vertebra shows rudimentary ribs and is named T1 in this study, consistent with prior study numbering. Per this counting, there would be 6 cervical, 12 thoracic and 5 lumber type vertebrae. Careful correlation with this numbering strategy prior to any procedural intervention would be recommended. Vertebrae: There has been significant interval growth of the mass lesion involving the T8 and T9 vertebrae corresponding to known metastatic disease. The expansile lesion involves most of the T8 and T9 vertebral body including pedicles and posterior elements extending into the spinal canal causing severe spinal canal stenosis and compression of the spinal cord which shows increased signal from the level of T8 through T10. There is also extension of the lesion to the ribcage on the right side, from T7 through T9, with severe narrowing of the right neural foramen from T7-8 through T9-10. Cord: Cord compression with increased T2 signal from T8 through T10, consistent with compressive myelopathy. Paraspinal and other soft tissues: Incidentally noted and partially the study is expansile mass lesion in the anterior  chest wall centered in the sternum, measuring approximately 7 x 5 x 3 cm, consistent with known metastatic lesion. Multiple small T2 hyperintense lesions in the liver are incidentally noted. Disc levels: Severe spinal canal stenosis with cord compression related to expansile lesion described above at the T8-T9 level with severe right-sided neural foraminal stenosis from T7 through T9. No significant disc bulge or herniation, spinal canal or neural foraminal stenosis in the other levels. IMPRESSION: 1. Please note this counting strategy prior to any procedural intervention would be recommended. 2. 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 3. Expansile mass in the anterior chest wall centered in the sternum, consistent with known metastatic lesion. Communicated to [employer] PA at 10:20 a.m. on 01/04/2020. Electronically Signed   By: Pedro Earls M.D.   On: 01/04/2020 10:25   MR LUMBAR SPINE W WO CONTRAST  Result Date: 01/04/2020 CLINICAL DATA:  Progressive neurological deficit in the bilateral lower extremity right greater than left. Question of chronic spinal compression. 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 EXAM: MRI LUMBAR SPINE WITHOUT AND WITH CONTRAST TECHNIQUE: Multiplanar and multiecho pulse sequences of the lumbar spine were obtained without and with intravenous contrast. CONTRAST:  4.68m GADAVIST GADOBUTROL 1 MMOL/ML IV SOLN COMPARISON:  CT of the abdomen August 21 2019 FINDINGS: Segmentation:  Standard. Alignment:  Physiologic. Vertebrae: No fracture, evidence of discitis, or bone lesion. 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 Conus medullaris and cauda equina: Conus extends to the L2 level. Conus and cauda equina appear normal. Paraspinal and other soft tissues: Negative. Disc levels: T12-L1 through L3-4: No spinal canal or neural foraminal stenosis. L4-5: Shallow disc bulge with superimposed small left subarticular/foraminal disc protrusion causing mild narrowing of the left neural foramen and left subarticular zone. L5-S1: No spinal canal or neural foraminal stenosis. IMPRESSION: 1. No cauda equina compression or abnormal contrast enhancement. 2. 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 3. Mild degenerative disc disease at L4-L5 with mild narrowing of the left neural foramen and left subarticular zone. No high-grade spinal canal or neural foraminal stenosis. Electronically Signed   By: KPedro EarlsM.D.   On: 01/04/2020 09:37   DG CHEST PORT 1 VIEW  Result Date: 01/14/2020 CLINICAL DATA:  Leukocytosis. EXAM: PORTABLE CHEST 1 VIEW COMPARISON:  None. FINDINGS: The heart size and mediastinal contours are within normal limits. Both lungs are clear. No pneumothorax or pleural effusion is noted. Status post surgical internal fixation of the thoracic spine. IMPRESSION: No active disease. Electronically Signed   By: JMarijo ConceptionM.D.   On: 01/14/2020 11:53   DG C-Arm 1-60 Min  Result Date: 01/04/2020 CLINICAL DATA:  T8-9 laminectomy for tumor. T7-10 instrumentation. EXAM: THORACIC SPINE 2 VIEWS; DG C-ARM 1-60 MIN COMPARISON:  01/04/2020 MRI thoracic spine. FLUOROSCOPY TIME:  Fluoroscopy Time:  1 minutes 0 seconds Number of Acquired Spot Images: 2 FINDINGS: Nondiagnostic AP and lateral intraoperative fluoroscopic thoracic spine radiographs demonstrate placement of bilateral pedicle screws at  multiple thoracic spine levels, numbering of which is not possible on these coned views. Temperature probe noted in the thoracic esophagus. IMPRESSION: Intraoperative fluoroscopic guidance for thoracic spine surgery. Electronically Signed   By: Ilona Sorrel M.D.   On: 01/04/2020 16:40   VAS [name] LOWER EXTREMITY VENOUS (DVT)  Result Date: 01/12/2020  Lower Venous DVTStudy Indications: Pain.  Performing Technologist: Antonieta Pert RDMS, RVT  Examination Guidelines: A complete evaluation includes B-mode imaging, spectral Doppler, color Doppler, and power Doppler as needed of all accessible portions of each vessel. Bilateral testing is considered an integral part  of a complete examination. Limited examinations for reoccurring indications may be performed as noted. The reflux portion of the exam is performed with the patient in reverse Trendelenburg.  +---------+---------------+---------+-----------+----------+--------------+ RIGHT    CompressibilityPhasicitySpontaneityPropertiesThrombus Aging +---------+---------------+---------+-----------+----------+--------------+ CFV      Full           Yes      Yes                                 +---------+---------------+---------+-----------+----------+--------------+ SFJ      Full                                                        +---------+---------------+---------+-----------+----------+--------------+ FV Prox  Full                                                        +---------+---------------+---------+-----------+----------+--------------+ FV Mid   Full                                                        +---------+---------------+---------+-----------+----------+--------------+ FV DistalFull                                                        +---------+---------------+---------+-----------+----------+--------------+ PFV      Full                                                        +---------+---------------+---------+-----------+----------+--------------+ POP      Full           Yes      Yes                                 +---------+---------------+---------+-----------+----------+--------------+ PTV      Full                                                        +---------+---------------+---------+-----------+----------+--------------+  PERO     Full                                                        +---------+---------------+---------+-----------+----------+--------------+ GSV      Full                                                        +---------+---------------+---------+-----------+----------+--------------+    +---------+---------------+---------+-----------+----------+--------------+ LEFT     CompressibilityPhasicitySpontaneityPropertiesThrombus Aging +---------+---------------+---------+-----------+----------+--------------+ CFV      Full           Yes      Yes                                 +---------+---------------+---------+-----------+----------+--------------+ SFJ      Full                                                        +---------+---------------+---------+-----------+----------+--------------+ FV Prox  Full                                                        +---------+---------------+---------+-----------+----------+--------------+ FV Mid   Full                                                        +---------+---------------+---------+-----------+----------+--------------+ FV DistalFull                                                        +---------+---------------+---------+-----------+----------+--------------+ PFV      Full                                                        +---------+---------------+---------+-----------+----------+--------------+ POP      Full           Yes      Yes                                 +---------+---------------+---------+-----------+----------+--------------+ PTV      Full                                                        +---------+---------------+---------+-----------+----------+--------------+  PERO     Full                                                        +---------+---------------+---------+-----------+----------+--------------+ GSV      Full                                                        +---------+---------------+---------+-----------+----------+--------------+     Summary: RIGHT: - There is no evidence of deep vein thrombosis in the lower extremity.  - No cystic structure found in the popliteal fossa.  LEFT: - There is no evidence of deep vein thrombosis in  the lower extremity.  - No cystic structure found in the popliteal fossa.  *See table(s) above for measurements and observations. Electronically signed by Harold Barban MD on 01/12/2020 at 5:48:42 PM.    Final     ASSESSMENT AND PLAN: 1.  Recurrent right breast cancer  2.  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  Leukocytosis 4.  Anemia 5.  Mild thrombocytosis  -Medications reviewed with the patient today.  Will restart her on letrozole 2.5 mg daily.  I have ordered this.  Will hold her Leslee Home given recent surgery and plans to begin radiation in the near future.  She already has an outpatient follow-up with Dr. Lindi Adie on 01/28/2020.  She was advised to keep this appointment. -The patient will begin radiation in the near future under the care of Dr. Sondra Come.  -The patient has leukocytosis but no fevers or chills.  Recent urine culture did not show any signs of infection.  Suspect this is reactive.  Continue to monitor.  I would proceed with blood cultures, repeat urine culture, and chest x-ray if she develops fevers or any other signs of infection. -The patient's hemoglobin is stable.  Continue to monitor. -Patient has mild thrombocytosis which is likely reactive.  Continue to monitor.   LOS: 10 days   Mikey Bussing, DNP, AGPCNP-BC, AOCNP 01/18/20  Attending Note  I personally saw the patient, reviewed the chart and examined the patient. The plan of care was discussed with the patient. Metastatic breast cancer with T8-T9 metastatic tumor causing lower extremity paralysis status post laminectomy surgery.  She tells me that she is extremely motivated to get stronger and feel better. She is completely on board to do palliative radiation therapy to the back. We will request radiation oncology and to see her as an outpatient and also assist her with her transportation needs. I will be seeing her towards end of radiation to start her on  Ibrance.  Leukocytosis: It is primarily elevated neutrophil count which is suspected to be due to inflammation of the breast.  Wound care has been following her. I recommended that she be discharged home with short course of antibiotics.  Thank you very much for allowing [name] to participate in her care.

## 2020-01-18 NOTE — Progress Notes (Signed)
Occupational Therapy Session Note  Patient Details  Name: Orilla Rogel MRN: [id number] Date of Birth: Nov 03, 1971  Today's Date: 01/18/2020 OT Individual Time: 0815-0900 OT Individual Time Calculation (min): 45 min    Short Term Goals: Week 2:  OT Short Term Goal 1 (Week 2): STG=LTG d/t ELOS  Skilled Therapeutic Interventions/Progress Updates:  Pt received supine in bed finishing up breakfast, but agreeable to OT intervention. Pt completed bed mobility with supervision with HOB 65 degrees and use of bed rails. Pt ambulated from EOB>Bathroom with Rw and CGA for balance to complete toileting tasks. Pt required set- up assist for posterior pericare via sit<>stand. Pt ambulated to sink for seated UB/ LB bathing from w/c level with CGA. Overall, pt required supervision for UB/LB bathing from w/c via lateral leans. Pt required set- up for UB dressing from w/c level and MOD A for LB dressing to don R AFO and shoe. Pt left seated in w/c with alarm pad activated and all needs within reach.   Therapy Documentation Precautions:  Precautions Precautions: Back Precaution Booklet Issued: No Precaution Comments: Reviewed back precautions.  Required Braces or Orthoses: Other Brace Other Brace: No brace needed per MD order Restrictions Weight Bearing Restrictions: No General:   Vital Signs:  Pain: Pt reports no pain during session.   Therapy/Group: Individual Therapy  Ihor Gully 01/18/2020, 9:23 AM

## 2020-01-19 ENCOUNTER — Telehealth: Payer: Self-pay

## 2020-01-19 ENCOUNTER — Inpatient Hospital Stay (HOSPITAL_COMMUNITY): Payer: Medicare PPO | Admitting: Physical Therapy

## 2020-01-19 ENCOUNTER — Inpatient Hospital Stay (HOSPITAL_COMMUNITY): Payer: Medicare PPO | Admitting: Occupational Therapy

## 2020-01-19 ENCOUNTER — Inpatient Hospital Stay (HOSPITAL_COMMUNITY): Payer: Medicare PPO

## 2020-01-19 LAB — CBC WITH DIFFERENTIAL/PLATELET
Abs Immature Granulocytes: 0.76 10*3/uL — ABNORMAL HIGH (ref 0.00–0.07)
Basophils Absolute: 0 10*3/uL (ref 0.0–0.1)
Basophils Relative: 0 %
Eosinophils Absolute: 0 10*3/uL (ref 0.0–0.5)
Eosinophils Relative: 0 %
HCT: 31.3 % — ABNORMAL LOW (ref 36.0–46.0)
Hemoglobin: 10.1 g/dL — ABNORMAL LOW (ref 12.0–15.0)
Immature Granulocytes: 3 %
Lymphocytes Relative: 4 %
Lymphs Abs: 0.8 10*3/uL (ref 0.7–4.0)
MCH: 31.2 pg (ref 26.0–34.0)
MCHC: 32.3 g/dL (ref 30.0–36.0)
MCV: 96.6 fL (ref 80.0–100.0)
Monocytes Absolute: 1.1 10*3/uL — ABNORMAL HIGH (ref 0.1–1.0)
Monocytes Relative: 5 %
Neutro Abs: 20.7 10*3/uL — ABNORMAL HIGH (ref 1.7–7.7)
Neutrophils Relative %: 88 %
Platelets: 427 10*3/uL — ABNORMAL HIGH (ref 150–400)
RBC: 3.24 MIL/uL — ABNORMAL LOW (ref 3.87–5.11)
RDW: 17.4 % — ABNORMAL HIGH (ref 11.5–15.5)
WBC: 23.5 10*3/uL — ABNORMAL HIGH (ref 4.0–10.5)
nRBC: 0.2 % (ref 0.0–0.2)

## 2020-01-19 MED ORDER — DIAZEPAM 5 MG PO TABS: 5.0000 mg | ORAL_TABLET | Freq: Four times a day (QID) | ORAL | 0 refills | Status: DC | PRN

## 2020-01-19 MED ORDER — OXYCODONE HCL 10 MG PO TABS: 10.0000 mg | ORAL_TABLET | ORAL | 0 refills | Status: DC | PRN

## 2020-01-19 MED ORDER — LETROZOLE 2.5 MG PO TABS: 2.5000 mg | ORAL_TABLET | Freq: Every day | ORAL | 1 refills | Status: DC

## 2020-01-19 MED ORDER — ENOXAPARIN SODIUM 40 MG/0.4ML ~~LOC~~ SOLN: SUBCUTANEOUS | 1 refills | Status: DC

## 2020-01-19 MED ORDER — ENOXAPARIN (LOVENOX) PATIENT EDUCATION KIT
PACK | Freq: Once | Status: AC
  Filled 2020-01-19: qty 1

## 2020-01-19 MED ORDER — POLYETHYLENE GLYCOL 3350 17 G PO PACK: 17.0000 g | PACK | Freq: Every day | ORAL | 0 refills | Status: DC | PRN

## 2020-01-19 MED ORDER — AMLODIPINE BESYLATE 10 MG PO TABS: 10.0000 mg | ORAL_TABLET | Freq: Every day | ORAL | 0 refills | Status: DC

## 2020-01-19 MED ORDER — VITAMIN D 50 MCG (2000 UT) PO TABS: 5000.0000 [IU] | ORAL_TABLET | Freq: Two times a day (BID) | ORAL | 0 refills | Status: DC

## 2020-01-19 NOTE — Progress Notes (Signed)
Physical Therapy Session Note  Patient Details  Name: Misty Arnold MRN: [id number] Date of Birth: 08-10-1971  Today's Date: 01/19/2020 PT Individual Time: 1330-1415 PT Individual Time Calculation (min): 45 min  PT Missed Time: 30 min Missed Time Reason: patient eating lunch  Short Term Goals: Week 2:  PT Short Term Goal 1 (Week 2): =LTG due to ELOS  Skilled Therapeutic Interventions/Progress Updates:    Pt received seated in w/c in room having just received her lunch, requesting time to eat. Pt given 15 min to eat then therapist returned for scheduled session. Pt still eating so pt given another 15 min to eat lunch. Pt missed 30 min of scheduled therapy session due to lunch arriving late and needing time to eat. Once therapist returned after 30 min pt agreeable to participate in therapy session. Pt is at mod I level for w/c mobility 150 (+) and independent for management of w/c parts. Pt is able to perform all transfers with RW at mod I level. Ambulation x 100 ft with RW and Supervision for safety. Orthotist present during session to provide pt with R PLS AFO, toe cap, and heel wedge that she will d/c home with. Pt exhibits improved gait with use of AFO, etc. with some ongoing ataxia in BLE noted during gait, much improved since eval. Ascend/descend 8 x 3" stairs with 2 handrails and min A for balance and control of L knee. Bed mobility mod I. Reviewed pt's HEP: bridges, heel slides, hip abd, hip flex, quad set, SAQ, seated marches, seated LAQ, standing TKE, and standing mini-squats. Provided handout of HEP and pt demos good understanding and performance of exercises. Pt left semi-reclined in bed with needs in reach at end of session.  Therapy Documentation Precautions:  Precautions Precautions: Back Precaution Booklet Issued: No Precaution Comments: Reviewed back precautions.  Required Braces or Orthoses: Other Brace Other Brace: No brace needed per MD order Restrictions Weight Bearing  Restrictions: No    Therapy/Group: Individual Therapy   Excell Seltzer, PT, DPT  01/19/2020, 3:42 PM

## 2020-01-19 NOTE — Progress Notes (Signed)
Occupational Therapy Session Note  Patient Details  Name: Misty Arnold MRN: [id number] Date of Birth: Jun 26, 1971  Today's Date: 01/19/2020 OT Individual Time: 7253-6644 OT Individual Time Calculation (min): 75 min    Short Term Goals: Week 1:  OT Short Term Goal 1 (Week 1): Pt will trasnfer to toilet wiht CGA consistently OT Short Term Goal 1 - Progress (Week 1): Met OT Short Term Goal 2 (Week 1): Pt will thread BLE into pants while maintaining BLT precations OT Short Term Goal 2 - Progress (Week 1): Met OT Short Term Goal 3 (Week 1): Pt will groom in standing and MIN A to demo improved endurance OT Short Term Goal 3 - Progress (Week 1): Met OT Short Term Goal 4 (Week 1): pt will transfer to shower wiht CGA OT Short Term Goal 4 - Progress (Week 1): Met Week 2:  OT Short Term Goal 1 (Week 2): STG=LTG d/t ELOS  Skilled Therapeutic Interventions/Progress Updates:  Pt received supine in bed agreeable to OT intervention. Pt completed bed mobility MOD I using bed rails with elevated HOB to transition supine>sit. Pt requested to transfer to [facility] from EOB d/t urgency. Pt able to complete stand pivot transfer to [facility] with RW MOD I and required set- up assist for hygiene. Pt completed UB/LB bathing from w/c MOD I. Pt required set- up assist for UB/LB dressing from w/c able to sit<>stand to pull pants up to waist line with supervision and able to don TED hose via figure four method with friction reducing device. Pt completed all UB grooming tasks from w/c at sink with supervision. Issued pt walker/ reacher bag for energy conservation and overall safety. Pt able to don elevating leg rests with set- up assist.  Pt left seated in w/c with all needs within reach.   Therapy Documentation Precautions:  Precautions Precautions: Back Precaution Booklet Issued: No Precaution Comments: Reviewed back precautions.  Required Braces or Orthoses: Other Brace Other Brace: No brace needed per MD  order Restrictions Weight Bearing Restrictions: No General:   Vital Signs:  Pain:  Pt reports mild tenderness in back, with RN having already given pain meds.  Therapy/Group: Individual Therapy  Ihor Gully 01/19/2020, 9:23 AM

## 2020-01-19 NOTE — Patient Care Conference (Signed)
Inpatient RehabilitationTeam Conference and Plan of Care Update Date: 01/19/2020   Time: 11:10 AM    Patient Name: Misty Arnold      Medical Record Number: [id number]  Date of Birth: 1971-06-24 Sex: Female         Room/Bed: 4W24C/4W24C-01 Payor Info: Payor: HU[employer] MEDICARE / Plan: HU[employer]NA MEDICARE CHOICE PPO / Product Type: *No Product type* /    Admit Date/Time:  01/08/2020  4:14 PM  Primary Diagnosis:  Incomplete paraplegia [facility])  Patient Active Problem List   Diagnosis Date Noted  . Myelopathy ([place]) 01/08/2020  . Incomplete paraplegia ([place]) 01/08/2020  . Epidural mass   . Metastatic breast cancer ([place])   . Weakness of both legs   . Leucocytosis   . Acute blood loss anemia   . Neurogenic bladder   . Neurogenic bowel   . S/P laminectomy 01/04/2020  . 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 Breast cancer of upper-outer quadrant of right female breast ([place]) 11/02/2014    Expected Discharge Date: Expected Discharge Date: 01/20/20  Team Members Present: Physician leading conference: Dr. Courtney Heys Care Coodinator Present: Karene Fry, RN, [place], [place] Nurse Present: Mohammed Kindle, RN PT Present: Excell Seltzer, PT OT Present: Willeen Cass, OT;Roanna Epley, COTA PPS Coordinator present : Gunnar Fusi, Novella Olive, PT     Current Status/Progress Goal Weekly Team Focus  Bowel/Bladder   continent  remain continent  assist as needed & monitor for changes   Swallow/Nutrition/ Hydration             ADL's   bathing/dressing-supervisoin; functional transfers-close supervision; toileting-supervision;  supervision overall  functional transfers, discharge planning, education, standing balance   Mobility   Supervision to mod I bed mobility, S to mod I transfers with RW and w/c mobility, S gait with RW  mod I overall, Supervision short distance gait  d/c planning   Communication             Safety/Cognition/ Behavioral Observations        Pain   pain to upper back, has tylenol scheduled & oxy 58m prn Q3 hrs  pain scale <4/10  assess & treat as needed   Skin   has steristrips to back incision, xeroform to right breast wound, stage 2 to buttocks  no signs of infection, no new areas of breakdown  assess q shift    Rehab Goals Patient on target to meet rehab goals: Yes *See Care Plan and progress notes for long and short-term goals.     Barriers to Discharge  Current Status/Progress Possible Resolutions Date Resolved   Nursing  Pending chemo/radiation               PT                    OT                  SLP                SW                Discharge Planning/Teaching Needs:  D/c to a friend's home with 24/7 care from various family members and friends  Family education as recommended by therapy   Team Discussion: MD has breast CA, needs chemo/rad tx in future, WBC up, chest wound draining, wound odor.  RN back pain, cont B/B.  OT mod I overall, S LB ADLs, LB D close S.  PT mod I overall, transfers S/mod I, S gait with walker, mom did fam ed yesterday.  Revisions to Treatment Plan: N/A     Medical Summary Current Status: continent bowel and bladder- pain in back, not R chest- LBM yesterday Weekly Focus/Goal: mod I bed; supervision 129f with RW; getting R AFO- d/c tomorrow- met all goals  Barriers to Discharge: Decreased family/caregiver support;Home enviroment access/layout;Wound care;Pending chemo/radiation  Barriers to Discharge Comments: n/a Possible Resolutions to Barriers: OT- Mod I overall- supervision for ADL LB- bathing at sink- d/c tomorrow   Continued Need for Acute Rehabilitation Level of Care: The patient requires daily medical management by a physician with specialized training in physical medicine and rehabilitation for the following reasons: Direction of a multidisciplinary physical rehabilitation program to maximize functional independence : Yes Medical management of patient stability  for increased activity during participation in an intensive rehabilitation regime.: Yes Analysis of laboratory values and/or radiology reports with any subsequent need for medication adjustment and/or medical intervention. : Yes   I attest that I was present, lead the team conference, and concur with the assessment and plan of the team.   LRetta Diones3/16/2021, 3:28 PM   Team conference was held via web/ teleconference due to [place]- 19

## 2020-01-19 NOTE — Progress Notes (Signed)
Social Work Patient ID: Misty Arnold, female   DOB: [birth year], 49 y.o.   MRN: [id number]   SW faxed DME order: w/c, 3in1 BSC, TTB, and RW  to [employer] ([phone]). SW spoke with [name] who reported will follow-up with pt to discuss co-pays.   *SW later met with pt to discuss DME. Reports she will have family pickup DME and bring to hospital. Pt reports preferred HHA are: [facility], Home Recovery Aide, and Team Nurse. SW will follow-up.  Loralee Pacas, MSW, [place] Office: [phone] Cell: [phone] Fax: [phone]

## 2020-01-19 NOTE — Progress Notes (Signed)
Occupational Therapy Discharge Summary  Patient Details  Name: Misty Arnold MRN: [id number] Date of Birth: 1971-08-23     Patient has met 8 of 9 long term goals due to improved activity tolerance, improved balance, postural control and ability to compensate for deficits.  Pt has made progress with BADLs, functional mobility/ transfers, and balance. Overall, pt completes functional mobility  MOD I  with RW needing supervision for more dynamic standing tasks. Pt completes UB ADLs with supervision and LB ADLs with supervision from w/c level. Pt with good insight into deficits and overall safety awareness able to lock w/c brakes when needed and demos good carryover of back precautions during functional tasks. Patient to discharge at overall Supervision level.  Patient's care partner is independent to provide the necessary physical and cognitive assistance at discharge.      Recommendation:  Patient will benefit from ongoing skilled OT services in home health setting to continue to advance functional skills in the area of BADL and iADL.  Equipment: BSC, TTB  Reasons for discharge: treatment goals met  Patient/family agrees with progress made and goals achieved: Yes  OT Discharge Precautions/Restrictions  Precautions Precautions: Back Precaution Booklet Issued: No Precaution Comments: Reviewed back precautions.  Required Braces or Orthoses: Other Brace Other Brace: No brace needed per MD order Restrictions Weight Bearing Restrictions: No General   Vital Signs  Pain Pain Assessment Pain Scale: Faces Pain Score: 2  Faces Pain Scale: Hurts a little bit Pain Type: Surgical pain Pain Location: Back Pain Orientation: Mid Pain Descriptors / Indicators: Tender Pain Frequency: Intermittent Pain Onset: On-going Patients Stated Pain Goal: 1 Pain Intervention(s): Other (Comment)(premedicated before session) Multiple Pain Sites: No ADL ADL Upper Body Bathing:  Supervision/safety Where Assessed-Upper Body Bathing: Sitting at sink Lower Body Bathing: Moderate assistance(A for feet) Where Assessed-Lower Body Bathing: Sitting at sink Upper Body Dressing: Supervision/safety Where Assessed-Upper Body Dressing: Sitting at sink Lower Body Dressing: Moderate assistance Where Assessed-Lower Body Dressing: Sitting at sink, Standing at sink, Medical laboratory scientific officer: Minimal assistance Toilet Transfer Method: Stand pivot Vision Baseline Vision/History: No visual deficits Vision Assessment?: No apparent visual deficits Perception  Perception: Within Functional Limits Praxis Praxis: Intact Cognition Overall Cognitive Status: Within Functional Limits for tasks assessed Arousal/Alertness: Awake/alert Orientation Level: Oriented X4 Sensation Sensation Light Touch: Impaired Detail Peripheral sensation comments: trunk decreased light touch Light Touch Impaired Details: Impaired LLE;Impaired RLE(numbness in BLE calf, ankles and toes. RLE>LLE; however improved since eval) Hot/Cold: Impaired Detail Hot/Cold Impaired Details: Impaired LLE;Impaired RLE Proprioception: Appears Intact;Impaired Detail Proprioception Impaired Details: Impaired RLE;Impaired LLE Additional Comments: impaired R greater than L Coordination Gross Motor Movements are Fluid and Coordinated: No Fine Motor Movements are Fluid and Coordinated: Yes Coordination and Movement Description: impaired BLEs GM Motor  Motor Motor: Ataxia Mobility  Bed Mobility Bed Mobility: Supine to Sit;Sit to Supine;Rolling Left Rolling Left: Independent Supine to Sit: Independent Sit to Supine: Independent Transfers Sit to Stand: Independent with assistive device Stand to Sit: Independent with assistive device  Trunk/Postural Assessment  Cervical Assessment Cervical Assessment: Within Functional Limits Thoracic Assessment Thoracic Assessment: Exceptions to [facility] Lumbar Assessment Lumbar  Assessment: Exceptions to [facility]  Balance Balance Balance Assessed: Yes Static Sitting Balance Static Sitting - Balance Support: No upper extremity supported;Feet supported Static Sitting - Level of Assistance: 7: Independent Dynamic Sitting Balance Dynamic Sitting - Balance Support: No upper extremity supported;Feet supported Dynamic Sitting - Level of Assistance: 7: Independent Static Standing Balance Static Standing - Balance Support: Bilateral upper extremity supported;During functional  activity Static Standing - Level of Assistance: 6: Modified independent (Device/Increase time) Dynamic Standing Balance Dynamic Standing - Balance Support: Bilateral upper extremity supported;During functional activity Dynamic Standing - Level of Assistance: 5: Stand by assistance Dynamic Standing - Balance Activities: Lateral lean/weight shifting;Forward lean/weight shifting;Reaching for objects;[place];Reaching across midline;Compliant surfaces Extremity/Trunk Assessment RUE Assessment RUE Assessment: Within Functional Limits LUE Assessment LUE Assessment: Within Functional Limits   Misty Arnold 01/19/2020, 8:38 AM

## 2020-01-19 NOTE — Telephone Encounter (Signed)
Oral Oncology Patient Advocate Encounter   Was successful in securing patient a $8000 grant from [facility] [place] (PAF) to provide copayment coverage for I[place].  This will keep the out of pocket expense at $0.     I have spoken with the patient.    The billing information is as follows and has been shared with [place]: [id number] PCN:  [id number] Member ID: [id number] Group ID: [id number] Dates of Eligibility: 01/19/20 through 01/18/21  [place] Patient [place] Phone [phone] Fax [phone] 01/19/2020 2:27 PM

## 2020-01-19 NOTE — Progress Notes (Signed)
Occupational Therapy Session Note  Patient Details  Name: Misty Arnold MRN: [id number] Date of Birth: 04-16-1971  Today's Date: 01/19/2020 OT Individual Time: 1015-1100 OT Individual Time Calculation (min): 45 min    Short Term Goals: Week 2:  OT Short Term Goal 1 (Week 2): STG=LTG d/t ELOS  Skilled Therapeutic Interventions/Progress Updates:    Pt resting in w/c upon arrival.  OT intervention with focus on w/c mobility, w/c setup, TTB transfers, functional transfers, and standing balance to increase independence with BADLs and prepare for discharge home tomorrow. Pt propelled w/c to tub room and practiced TTB transfer with supervisoin.  Pt propelled w/c to gym and engaged in sit<>stand/squats (10X 6) and dynamic standing balance with therapy ball.  Pt required min A for standing balance.  Pt with difficulty transferring weight onto heels when standing and engaging in tasks.  Pt returned to w/c and propelled back to room. Pt remained in w/c with all needs within reach and seat alarm activated.   Therapy Documentation Precautions:  Precautions Precautions: Back Precaution Booklet Issued: No Precaution Comments: Reviewed back precautions.  Required Braces or Orthoses: Other Brace Other Brace: No brace needed per MD order Restrictions Weight Bearing Restrictions: No  Pain: Pain Assessment Pain Scale: Faces Faces Pain Scale: Hurts a little bit Pain Type: Surgical pain Pain Location: Back Pain Orientation: Mid Pain Descriptors / Indicators: Tender Pain Onset: On-going Patients Stated Pain Goal: 1 Pain Intervention(s): Other (Comment)(premedicated before session) Multiple Pain Sites: No   Therapy/Group: Individual Therapy  Leroy Libman 01/19/2020, 12:13 PM

## 2020-01-19 NOTE — Progress Notes (Signed)
[place] PHYSICAL MEDICINE & REHABILITATION PROGRESS NOTE   Subjective/Complaints:  Pt reports didn't go to [place]- Onc NP came here- and saw her.   Concerned because fungating masses opening up more at bottom- draining more "pus" per pt.   Sometimes goes to bathroom needing void and not able to- waits a few minutes and can sometimes void. Denies straining with bowel or bladder.   Asking to see Oncologist Dr Lindi Adie.   ROS:  Pt denies SOB, abd pain, CP, N/V/C/D, and vision changes     Objective:   No results found. Recent Labs    01/18/20 0540 01/19/20 0501  WBC 24.2* 23.5*  HGB 9.6* 10.1*  HCT 29.9* 31.3*  PLT 414* 427*   Recent Labs    01/18/20 0540  NA 139  K 4.5  CL 101  CO2 28  GLUCOSE 153*  BUN 19  CREATININE 0.55  CALCIUM 8.5*    Intake/Output Summary (Last 24 hours) at 01/19/2020 1018 Last data filed at 01/19/2020 0841 Gross per 24 hour  Intake 840 ml  Output -  Net 840 ml     Physical Exam: Vital Signs Blood pressure (!) 155/97, pulse 95, temperature (!) 97.5 F (36.4 C), temperature source Oral, resp. rate 18, height 5\' 5"  (1.651 m), weight 55.4 kg, SpO2 100 %. Labs and vitals reviewed General: pt sitting up in manual w/c, doing grooming with nurse changing chest bandage, NAD HENT: EOMI B/L Neck: supple CV: RRR; no JVD Respiratory: CTA B/L- no W/R/R GI: Soft, NT, ND, (+)BS  Musculoskeletal:     Comments: No edema or tenderness in extremities  Neurological: She is alert.  Ox3 Motor:  LLE HF 4/5, KE 4/5, DF 3/5, PF 3/5 RLE- HF 2/5, KE 2/5, DF 0/5, PF 2/5 Has sensory line at T12 where sensation diminishes-  Skin: fungating masses more slough on top ones;  A little more irritated- bottom mass opening up and draining more yellowish purulent drainage.   Larger upper mass- expanding size.  pic today 3/16     Back incision healing well- steristrips in place- looks good- nod rainage Psychiatric: calm but anxious about wounds   Assessment/Plan: 1. 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  Physiatrist is providing close team supervision and 24 hour management of active medical problems listed below.  Physiatrist and rehab team continue to assess barriers to discharge/monitor patient progress toward functional and medical goals  Care Tool:  Bathing    Body parts bathed by patient: Right arm, Left arm, Chest, Abdomen, Front perineal area, Buttocks, Right upper leg, Left upper leg, Face, Right lower leg, Left lower leg   Body parts bathed by helper: Right lower leg, Left lower leg     Bathing assist Assist Level: Independent with assistive device Assistive Device Comment: holding on to sink for sit<>stand; seated in w/c   Upper Body Dressing/Undressing Upper body dressing   What is the patient wearing?: Pull over shirt    Upper body assist Assist Level: Independent with assistive device Assistive Device Comment: from w/c  Lower Body Dressing/Undressing Lower body dressing      What is the patient wearing?: Pants     Lower body assist Assist for lower body dressing: Set up assist  Toileting Toileting    Toileting assist Assist for toileting: Independent with assistive device Assistive Device Comment: RW   Transfers Chair/bed transfer  Transfers assist     Chair/bed transfer assist level: Supervision/Verbal cueing     Locomotion Ambulation   Ambulation assist      Assist level: Contact Guard/Touching assist Assistive device: Walker-rolling Max distance: 50'   Walk 10 feet activity   Assist     Assist  level: Contact Guard/Touching assist Assistive device: Walker-rolling   Walk 50 feet activity   Assist Walk 50 feet with 2 turns activity did not occur: Safety/medical concerns  Assist level: Contact Guard/Touching assist Assistive device: Walker-rolling    Walk 150 feet activity   Assist Walk 150 feet activity did not occur: Safety/medical concerns         Walk 10 feet on uneven surface  activity   Assist Walk 10 feet on uneven surfaces activity did not occur: Safety/medical concerns         Wheelchair     Assist Will patient use wheelchair at discharge?: Yes Type of Wheelchair: Manual    Wheelchair assist level: Independent Max wheelchair distance: >111ft    Wheelchair 50 feet with 2 turns activity    Assist        Assist Level: Independent   Wheelchair 150 feet activity     Assist      Assist Level: Independent   Blood pressure (!) 155/97, pulse 95, temperature (!) 97.5 F (36.4 C), temperature source Oral, resp. rate 18, height 5\' 5"  (1.651 m), weight 55.4 kg, SpO2 100 %.  Medical Problem List and Plan: 1.  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  No back brace required.             -patient may not shower             -ELOS/Goals: Supervision/Min A             Continue CIR PT, OT  3/9- did ASIA exam today and determined pt level of injury T11 ASIA C/D  3/10- reassured pt Dr Trenton Gammon will come back if issues; otherwise with schedule F/U with him and Heme/Onc- no radiation/chemo until incision is healed- that's Onc recs.   3/12- pt upset about "sight" of R chest masses/lesions from R breast CA- will see if can be seen by Neuropsychology.   3/13- spoke to [place]- pt's Oncologist  back Monday- have asked wound care to see pt. They wrote a note, but did not see patient directly again- have attempted to page them to see if they can come back and see pt directly, since looking concerning- WBC ordered for AM since leukocytosis is worse- concerned might also be infected as well, since concern about purulent drainage.  3/14- reordered labs for tomorrow since didn't get done- have reordered [place] consult since pt wasn't re-evaluated in person and wounds on R chest look a lot worse- Oncology specifically asked for pt to be seen directly. Attempted to page wound care nurse multiple times. Maybe she isn't on?   3/15: Agree with Dr. Florentina Jenny concerns for infection given purulent drainage as well as increasing luekocytosis. Patient has appointment today at [facility] for repeat labs and wound care evaluation.   3/16- will call Dr Lindi Adie.  2.  Antithrombotics: -DVT/anticoagulation: SCDs.  Dopplers ordered. Plan to discuss with neurosurgery question Lovenox 3/8- will need Lovenox as soon as OK by NSU due to cancer and SCI dx. 3/9- got approval to start Lovenox 40 mg daily- started  3/10- dopplers done yesterday- no DVTs seen. 3/11- pt asked if could come off Lovenox explained can, but her risk of DVT is ?80% in 2 months- if walking, can use Lovenox x8 weeks from surgery- if not, 12 weeks. She voiced understanding and agreed to continue med.              -antiplatelet therapy: N/A 3. Pain Management: Oxycodone as needed, Valium as needed muscle spasms             Monitor with increased exertion. Well controlled.   3/7: continues to have pain but trying to rely less on oxycodone. Discussed scheduling Tylenol and she is agreeable.  3/8- pt trying to use tylenol as much as possible.    3/10- pain well controlled- at 2-3/10 after OT.   3/12- still taking oxycodone sometime,s but also intermixed with tylenol.  3/14- taking oxy 1-2x/day usually;t ylenol otherwise   3/15: well controlled 4. Mood:  Provide emotional support             -antipsychotic agents: N/A 5. Neuropsych: This patient is capable of making decisions on her own behalf. 6. Skin/Wound Care: Routine skin checks 7. Fluids/Electrolytes/Nutrition: Routine in and outs.  CMP ordered for tomorrow.  8.  Neurogenic bowel/bladder.  PVRs ordered.  Establish bowel program  3/8- refused any bowel meds except miralax- insists voiding OK.   3/9- daily BMs- 2 yesterday- no accidents- bladder- hasn't needed cathing- con't regimen 9.  History of metastatic breast cancer.  Current plans to follow-up outpatient radiation oncology Dr. Sondra Come as well as oncology service Dr. Lindi Adie to establish radiation therapy.  Patient's [facility] is currently.  3/9- pt upset about changes to fungating masses- what things look like-    3/12- pt uncomfortable with photo? However keeping a close monitor on R chest and appearance.   3/13- took phot today- looks much more concerning- labs for AM and Oncology and [place] called/consulte-d waiting for [place] to see pt directly.  10.  Acute blood loss anemia.  CBC ordered. Has not yet resulted.  3/8- Hb down to 8.6- was 9.0- will monitor  11.  Leukocytosis: Likely steroid-induced.  CBC ordered for tomorrow.   3/8- pt's WBC going up 19k- afebrile- denies Sx's of infection except sweating which can come from steroids- will monitor  3/10- labs Thursday  3/11- afebrile, but WBC up to 22.8k- will check U/A and Cx and CXR just to make sure not missing anything and recheck on Sunday.   3/12- no Sx's of UTI or resp issues- CXR and U/A (-) for infection.   3/13- labs for AM since pus coming from R chest masses.  3/14- labs in AM   3/15: leukocytosis increasing. Continue to trend. Ordered repeat for tomorrow AM.  12. Incontinence: Mrs. Versteeg was upset that she called to be taken to bathroom to urinate last night and fell asleep before anyone got to her, and that her dressing was soiled with urine this morning. She requests bedpan be  placed by bedside in case she has future similar episode. Placed nursing order.   3/7: no similar issues overnight  3/8- concerned that incontinence can be from SCI- will need to do ASIA exam tomorrow.  3/14- pt sitting on toilet a long time- trying to void- wondering  if retaining and needing to push- will d/w further once topic of R chest dealt with.  13. Constipation  3/8- pt only willing to try miralax- encouraged to take today. Will see when goes/if goes.        LOS: 11 days A FACE TO FACE EVALUATION WAS PERFORMED  Jessenya Berdan 01/19/2020, 10:18 AM

## 2020-01-19 NOTE — Progress Notes (Signed)
Physical Therapy Discharge Summary  Patient Details  Name: Misty Arnold MRN: [id number] Date of Birth: 1970-12-02  Today's Date: 01/19/2020  Patient has met 9 of 11 long term goals due to improved activity tolerance, improved balance, improved postural control, increased strength and ability to compensate for deficits.  Patient to discharge at a wheelchair level Modified Independent.   Patient's care partner is independent to provide the necessary physical assistance at discharge. Patient's mom has completed hands on family education and is safe to assist pt upon d/c home. Pt is overall at mod I level and just requires Supervision for short distance gait.  Reasons goals not met: Pt did not meet car transfer goal of mod I but is Supervision for this transfer. Pt met goal adequately for a safe d/c home as her family can provide Supervision assist for this transfer. Also pt did not meet CGA goal for stairs as she currently requires min A. Pt does not have to perform stairs in order to navigate her home and again has met goal adequately for a safe d/c home.  Recommendation:  Patient will benefit from ongoing skilled PT services in home health setting to continue to advance safe functional mobility, address ongoing impairments in endurance, strength, safety, independence with functional mobility, balance, and minimize fall risk.  Equipment: RW, 16x16 w/c  Reasons for discharge: treatment goals met and discharge from hospital  Patient/family agrees with progress made and goals achieved: Yes  PT Discharge Precautions/Restrictions Precautions Precautions: Back Precaution Comments: Reviewed back precautions.  Other Brace: No brace needed per MD order Restrictions Weight Bearing Restrictions: No Pain Pain Assessment Pain Score: 2  Vision/Perception  Perception Perception: Within Functional Limits Praxis Praxis: Intact  Cognition Overall Cognitive Status: Within Functional Limits for  tasks assessed Arousal/Alertness: Awake/alert Orientation Level: Oriented X4 Attention: Focused;Sustained Focused Attention: Appears intact Sustained Attention: Appears intact Memory: Appears intact Awareness: Appears intact Safety/Judgment: Appears intact Sensation Sensation Light Touch: Impaired Detail Peripheral sensation comments: trunk decreased light touch Light Touch Impaired Details: Impaired LLE;Impaired RLE Proprioception: Impaired Detail Proprioception Impaired Details: Impaired RLE;Impaired LLE Coordination Gross Motor Movements are Fluid and Coordinated: No Fine Motor Movements are Fluid and Coordinated: Yes Coordination and Movement Description: impaired BLEs GM Motor  Motor Motor: Ataxia Motor - Discharge Observations: ongoing BLE ataxia, improved since eval  Mobility Bed Mobility Bed Mobility: Rolling Right;Rolling Left;Supine to Sit;Sit to Supine Rolling Right: Independent with assistive device Rolling Left: Independent with assistive device Supine to Sit: Independent with assistive device Sit to Supine: Independent with assistive device Transfers Transfers: Sit to Stand;Stand to Sit;Stand Pivot Transfers Sit to Stand: Independent with assistive device Stand to Sit: Independent with assistive device Stand Pivot Transfers: Independent with assistive device Transfer (Assistive device): Rolling walker Locomotion  Gait Ambulation: Yes Gait Assistance: Supervision/Verbal cueing Gait Distance (Feet): 100 Feet Assistive device: Rolling walker Gait Assistance Details: Verbal cues for technique Gait Gait: Yes Gait Pattern: Impaired Gait Pattern: Decreased hip/knee flexion - right;Decreased hip/knee flexion - left;Decreased dorsiflexion - right;Right circumduction;Right genu recurvatum;Left genu recurvatum Gait velocity: decreased Stairs / Additional Locomotion Stairs: Yes Stairs Assistance: Minimal Assistance - Patient > 75% Stair Management Technique: Two  rails Number of Stairs: 8 Height of Stairs: 3 Wheelchair Mobility Wheelchair Mobility: Yes Wheelchair Assistance: Independent with Camera operator: Both upper extremities Wheelchair Parts Management: Independent Distance: 150+  Trunk/Postural Assessment  Cervical Assessment Cervical Assessment: Within Functional Limits Thoracic Assessment Thoracic Assessment: Exceptions to WFL(back precautions) Lumbar Assessment Lumbar Assessment: Exceptions to WFL(back  precautions) Postural Control Postural Control: Deficits on evaluation(delayed)  Balance Balance Balance Assessed: Yes Static Sitting Balance Static Sitting - Balance Support: No upper extremity supported;Feet supported Static Sitting - Level of Assistance: 7: Independent Dynamic Sitting Balance Dynamic Sitting - Balance Support: No upper extremity supported;Feet supported Dynamic Sitting - Level of Assistance: 7: Independent Static Standing Balance Static Standing - Balance Support: Bilateral upper extremity supported;During functional activity Static Standing - Level of Assistance: 6: Modified independent (Device/Increase time) Dynamic Standing Balance Dynamic Standing - Balance Support: Bilateral upper extremity supported;During functional activity Dynamic Standing - Level of Assistance: 5: Stand by assistance Extremity Assessment   RLE Assessment RLE Assessment: Exceptions to [facility] General Strength Comments: grossly 3/5, DF 1/5 LLE Assessment LLE Assessment: Exceptions to [facility] General Strength Comments: grossly 3/5     Excell Seltzer, PT, DPT 01/19/2020, 3:41 PM

## 2020-01-20 MED ORDER — DEXAMETHASONE 0.5 MG PO TABS: 2.0000 mg | ORAL_TABLET | Freq: Four times a day (QID) | ORAL | 0 refills | Status: DC

## 2020-01-20 NOTE — Progress Notes (Signed)
[place] PHYSICAL MEDICINE & REHABILITATION PROGRESS NOTE   Subjective/Complaints:  Pt saw Dr Lindi Adie- didn't have him look at R chest masses.   Plans on H/H for wound care.   D/c today   ROS:  Pt denies SOB, abd pain, CP, N/V/C/D, and vision changes     Objective:   No results found. Recent Labs    01/18/20 0540 01/19/20 0501  WBC 24.2* 23.5*  HGB 9.6* 10.1*  HCT 29.9* 31.3*  PLT 414* 427*   Recent Labs    01/18/20 0540  NA 139  K 4.5  CL 101  CO2 28  GLUCOSE 153*  BUN 19  CREATININE 0.55  CALCIUM 8.5*    Intake/Output Summary (Last 24 hours) at 01/20/2020 0851 Last data filed at 01/20/2020 0830 Gross per 24 hour  Intake 740 ml  Output --  Net 740 ml     Physical Exam: Vital Signs Blood pressure (!) 150/91, pulse 80, temperature 97.8 F (36.6 C), temperature source Oral, resp. rate 20, height 5\' 5"  (1.651 m), weight 55.4 kg, SpO2 100 %. Labs and vitals reviewed General: sitting up in bed; appropriate, NAD HENT: EOMI B/L Neck: supple CV: RRR; no JVD Respiratory: CTA B/L- no W/R/R GI: Soft, NT, ND, (+)BS  Musculoskeletal:     Comments: No edema or tenderness in extremities  Neurological: She is alert.  Ox3 Motor:  LLE HF 4/5, KE 4/5, DF 3/5, PF 3/5 RLE- HF 2/5, KE 2/5, DF 0/5, PF 2/5 Has sensory line at T12 where sensation diminishes-  Skin: fungating masses more slough on top ones;  A little more irritated- bottom mass opening up and draining more yellowish purulent drainage.   Larger upper mass- expanding size.  pic today 3/16     Back incision healing well- steristrips in place- looks good- nod rainage Psychiatric: calm but anxious about wounds  Assessment/Plan: 1. 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  Physiatrist is providing close team supervision and 24 hour management of active medical problems listed below.  Physiatrist and rehab team continue to assess barriers to discharge/monitor patient progress toward functional and medical goals  Care Tool:  Bathing    Body parts bathed by patient: Right arm, Left arm, Chest, Abdomen, Front perineal area, Buttocks, Right upper leg, Left upper leg, Face, Right lower leg, Left lower leg   Body parts bathed by helper: Right lower leg, Left lower leg     Bathing assist Assist Level: Independent with assistive device Assistive Device Comment: holding on to sink for sit<>stand; seated in w/c   Upper Body Dressing/Undressing Upper body dressing   What is the patient wearing?: Pull over shirt    Upper body assist Assist Level: Independent with assistive device Assistive Device Comment: from w/c  Lower Body Dressing/Undressing Lower body dressing      What is the patient wearing?: Pants     Lower body assist Assist for lower body dressing: Set up assist     Toileting Toileting    Toileting assist Assist for toileting: Independent with assistive device Assistive Device Comment: RW   Transfers Chair/bed transfer  Transfers assist     Chair/bed transfer assist level: Independent with assistive device Chair/bed transfer assistive device: Gilford Rile  Locomotion Ambulation   Ambulation assist      Assist level: Supervision/Verbal cueing Assistive device: Walker-rolling Max distance: 100'   Walk 10 feet activity   Assist     Assist level: Supervision/Verbal cueing Assistive device: Walker-rolling   Walk 50 feet activity   Assist Walk 50 feet with 2 turns activity did not occur: Safety/medical concerns  Assist level: Supervision/Verbal cueing Assistive device:  Walker-rolling    Walk 150 feet activity   Assist Walk 150 feet activity did not occur: Safety/medical concerns         Walk 10 feet on uneven surface  activity   Assist Walk 10 feet on uneven surfaces activity did not occur: Safety/medical concerns         Wheelchair     Assist Will patient use wheelchair at discharge?: Yes Type of Wheelchair: Manual    Wheelchair assist level: Independent Max wheelchair distance: >155ft    Wheelchair 50 feet with 2 turns activity    Assist        Assist Level: Independent   Wheelchair 150 feet activity     Assist      Assist Level: Independent   Blood pressure (!) 150/91, pulse 80, temperature 97.8 F (36.6 C), temperature source Oral, resp. rate 20, height 5\' 5"  (1.651 m), weight 55.4 kg, SpO2 100 %.  Medical Problem List and Plan: 1.  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  No back brace required.             -patient may not shower             -ELOS/Goals: Supervision/Min A             Continue CIR PT, OT  3/9- did ASIA exam today and determined pt level of injury T11 ASIA C/D  3/10- reassured pt Dr Trenton Gammon will come back if issues; otherwise with schedule F/U with him and Heme/Onc- no radiation/chemo until incision is healed- that's Onc recs.   3/12- pt upset about "sight" of R chest masses/lesions from R breast CA- will see if can be seen by Neuropsychology.   3/13- spoke to [place]- pt's Oncologist back Monday- have asked wound care to see pt. They wrote a note, but did not see patient directly again- have attempted to page them to see if they can come back and see pt directly, since looking concerning- WBC ordered for AM since leukocytosis is  worse- concerned might also be infected as well, since concern about purulent drainage.  3/14- reordered labs for tomorrow since didn't get done- have reordered [place] consult since pt wasn't re-evaluated in person and wounds on R chest look a lot worse- Oncology specifically asked for pt to be seen directly. Attempted to page wound care nurse multiple times. Maybe she isn't on?   3/15: Agree with Dr. Florentina Jenny concerns for infection given purulent drainage as well as increasing luekocytosis. Patient has appointment today at [facility] for repeat labs and wound care evaluation.   3/16- will call Dr Lindi Adie.   3/17- dr Lindi Adie saw pt- discussing ways to get radiation for pt.  2.  Antithrombotics: -DVT/anticoagulation: SCDs.  Dopplers ordered. Plan to discuss with neurosurgery question Lovenox 3/8- will need Lovenox as soon as OK by NSU due to cancer and SCI dx. 3/9- got approval to  start Lovenox 40 mg daily- started  3/10- dopplers done yesterday- no DVTs seen. 3/11- pt asked if could come off Lovenox explained can, but her risk of DVT is ?80% in 2 months- if walking, can use Lovenox x8 weeks from surgery- if not, 12 weeks. She voiced understanding and agreed to continue med.              -antiplatelet therapy: N/A 3. Pain Management: Oxycodone as needed, Valium as needed muscle spasms             Monitor with increased exertion. Well controlled.   3/7: continues to have pain but trying to rely less on oxycodone. Discussed scheduling Tylenol and she is agreeable.  3/8- pt trying to use tylenol as much as possible.    3/10- pain well controlled- at 2-3/10 after OT.   3/12- still taking oxycodone sometime,s but also intermixed with tylenol.  3/14- taking oxy 1-2x/day usually;t ylenol otherwise   3/15: well controlled 4. Mood: Provide emotional support             -antipsychotic agents: N/A 5. Neuropsych: This patient is capable of making decisions on her own behalf. 6. Skin/Wound Care: Routine skin  checks 7. Fluids/Electrolytes/Nutrition: Routine in and outs.  CMP ordered for tomorrow.  8.  Neurogenic bowel/bladder.  PVRs ordered.  Establish bowel program  3/8- refused any bowel meds except miralax- insists voiding OK.   3/9- daily BMs- 2 yesterday- no accidents- bladder- hasn't needed cathing- con't regimen 9.  History of metastatic breast cancer.  Current plans to follow-up outpatient radiation oncology Dr. Sondra Come as well as oncology service Dr. Lindi Adie to establish radiation therapy.  Patient's [facility] is currently.  3/9- pt upset about changes to fungating masses- what things look like-    3/12- pt uncomfortable with photo? However keeping a close monitor on R chest and appearance.   3/13- took phot today- looks much more concerning- labs for AM and Oncology and [place] called/consulte-d waiting for Grace[facility] to see pt directly.  10.  Acute blood loss anemia.  CBC ordered. Has not yet resulted.  3/8- Hb down to 8.6- was 9.0- will monitor  11.  Leukocytosis: Likely steroid-induced.  CBC ordered for tomorrow.   3/8- pt's WBC going up 19k- afebrile- denies Sx's of infection except sweating which can come from steroids- will monitor  3/10- labs Thursday  3/11- afebrile, but WBC up to 22.8k- will check U/A and Cx and CXR just to make sure not missing anything and recheck on Sunday.   3/12- no Sx's of UTI or resp issues- CXR and U/A (-) for infection.   3/13- labs for AM since pus coming from R chest masses.  3/14- labs in AM   3/15: leukocytosis increasing. Continue to trend. Ordered repeat for tomorrow AM.  12. Incontinence: Mrs. Insko was upset that she called to be taken to bathroom to urinate last night and fell asleep before anyone got to her, and that her dressing was soiled with urine this morning. She requests bedpan be placed by bedside in case she has future similar episode. Placed nursing order.   3/7: no similar issues overnight  3/8- concerned that incontinence can be from SCI- will need  to do ASIA exam tomorrow.  3/14- pt sitting on toilet a long time- trying to void- wondering if retaining and needing to push- will d/w further once topic of R chest dealt with.  13. Constipation  3/8- pt only willing to try miralax- encouraged to  take today. Will see when goes/if goes. 14. Dispo  3/17- will need f/u with Dr Dagoberto Ligas in next few weeks.        LOS: 12 days A FACE TO FACE EVALUATION WAS PERFORMED  Alvera Tourigny 01/20/2020, 8:51 AM

## 2020-01-20 NOTE — Progress Notes (Signed)
Pt discharged home with family. Belongings sent with pt. Discharge instructions given by Linna Hoff, PA. No further questions from pt of family. Pt is stable at time of discharge.   Gerald Stabs, RN

## 2020-01-20 NOTE — Progress Notes (Signed)
Social Work Patient ID: Misty Arnold, female   DOB: 1971-10-20, 49 y.o.   MRN: [id number]   SW met with pt in room to provide updates on locating HHA. SW informed if not secured before discharge, SW will follow-up.   Preferred HHA: 1) [facility] [phone])- Out of network; 2) [place] [phone])- SW spoke with Jonelle Sidle and pt is out of network and they do not service area, and 3) [facility] [phone])- left message and no return phone call. Later follow up again, unable to accept as only provide personal care services.   Declined agencies: [facility]- unable to accept [place] [phone])- out of [place] ([phone]) does not service area [facility] care [phone]) does not service area, suggested [facility]) [facility] of [place] [phone])- not in insurance network  SW spoke with Wendy/Administrator with [facility] [phone]) who reports reviewed pt referral and states will not able to start care until Tuesday or Wednesday next week. States she will also need a teachable caregiver that she is able to talk with to discuss if she can commit to wound care needs for pt. SW spoke with pt about issues with HH, and conditions for this HHA. Pt states the agency can contact her mother ([name]/[phone]). SW left message for Abigail Butts with contact information for pt mother. SW waiting on follow-up.  Per nursing team, pt stated she is not able to get DME until tomorrow. SW spoke with E. Lopez [phone]) about DME, and she statede pt called to inform unable to pick up. SW discussed with pt that is she is unable to get DME, she will not be able to d/c to home. Pt states to call [name] [phone]) who will be able to help. SW called [name] to discuss issue. Reports she should be able to get pt DME for her  today, atleast the w/c. SW provided her with contact person/information.  *SW received updates from Courtney/[employer] [place] to report that family was at location picking up DME.   Loralee Pacas, MSW, [place] Office: [phone] Cell: [phone] Fax: [phone]

## 2020-01-20 NOTE — Progress Notes (Signed)
Education on Lovenox administration started on 01/20/20. Education kit provided to pt. On 01/21/20, pt was able to selfadminister Lovenox without any issues. Pt states she feel comfortable with administering injection.   Pt also educated on dressing changes to right breast wound. Pt demonstrated dressing change to the site effriciently. No further questions from pt.

## 2020-01-20 NOTE — Discharge Instructions (Signed)
Inpatient Rehab Discharge Instructions  Maelena Haushalter Discharge date and time: No discharge date for patient encounter.   Activities/Precautions/ Functional Status: Activity: activity as tolerated Diet: regular diet Wound Care: none needed Functional status:  ___ No restrictions     ___ Walk up steps independently ___ 24/7 supervision/assistance   ___ Walk up steps with assistance ___ Intermittent supervision/assistance  ___ Bathe/dress independently ___ Walk with walker     _x__ Bathe/dress with assistance ___ Walk Independently    ___ Shower independently ___ Walk with assistance    ___ Shower with assistance ___ No alcohol     ___ Return to work/school ________   COMMUNITY REFERRALS UPON DISCHARGE:    Home Health:   PT     OT       RN                 Agency: Phone:    Medical Equipment/Items Ordered: wheelchair, rolling walker, 3in1 bedside commode, tub transfer bench                                                 Agency/Supplier: [employer] [phone]   Special Instructions: No driving smoking or alco-hol  Follow-up Dr.Kinard and Dr Lindi Adie for planned radiation therapy    Home health nurse wound care.  Wound care right mastectomy sie cleansed with normal saline pat dry.  Fill the defect at most distal aspect of the tumor with a 1 inch ribbon of Xeroform leaving a tail protruding for ease and retrieval.  Cover the defect and remainder of wound with Xeroform gauze pieces.  Top with an ABD pad, secure with Kerlix roll gauze, top with 6 inch Ace bandage.  Change twice daily as needed   My questions have been answered and I understand these instructions. I will adhere to these goals and the provided educational materials after my discharge from the hospital.  Patient/Caregiver Signature _______________________________ Date __________  Clinician Signature _______________________________________ Date __________  Please bring this form and your medication list with  you to all your follow-up doctor's appointments.

## 2020-01-21 ENCOUNTER — Telehealth: Payer: Self-pay

## 2020-01-21 NOTE — Telephone Encounter (Signed)
SW received message from Wendy/Administrator with [facility] [phone]) who stated they reviewed pt chart, however unable to accept as they are down a nurse. SW left message asking if there were other agencies in the area that can assist the pt with wound care.

## 2020-01-21 NOTE — Progress Notes (Signed)
Social Work Discharge Note   The overall goal for the admission was met for:   Discharge location: Yes. D/c to a friend's home where she will have assistance from family/friends.   Length of Stay: Yes. 12 days  Discharge activity level: Yes. Mod I  Home/community participation: Yes  Services provided included: MD, RD, PT, OT, RN, CM, TR, Pharmacy, Neuropsych and SW  Financial Services: Private Insurance: [employer]  Follow-up services arranged: Home Health: Unable to establish before d/c. Continuing to work on for wound care needs. See notes and DME: [facility] for w/c, TTB, 3in1 BSC, and RW [phone]  Comments (or additional information): contact pt [phone]  Patient/Family verbalized understanding of follow-up arrangements: Yes  Individual responsible for coordination of the follow-up plan: Pt will have assistance with coordinating care needs.   Confirmed correct DME delivered: [name] 01/21/2020    [name]

## 2020-01-22 ENCOUNTER — Telehealth: Payer: Self-pay | Admitting: Registered Nurse

## 2020-01-22 ENCOUNTER — Telehealth: Payer: Self-pay

## 2020-01-22 ENCOUNTER — Other Ambulatory Visit: Payer: Self-pay

## 2020-01-22 DIAGNOSIS — C50411 Malignant neoplasm of upper-outer quadrant of right female breast: Secondary | ICD-10-CM

## 2020-01-22 DIAGNOSIS — Z170 Estrogen receptor positive status [ER+]: Secondary | ICD-10-CM

## 2020-01-22 MED ORDER — LETROZOLE 2.5 MG PO TABS: 2.5000 mg | ORAL_TABLET | Freq: Every day | ORAL | 1 refills | Status: DC

## 2020-01-22 MED ORDER — DEXAMETHASONE 0.5 MG PO TABS: 2.0000 mg | ORAL_TABLET | Freq: Four times a day (QID) | ORAL | 0 refills | Status: DC

## 2020-01-22 MED ORDER — ENOXAPARIN SODIUM 40 MG/0.4ML ~~LOC~~ SOLN: SUBCUTANEOUS | 1 refills | Status: DC

## 2020-01-22 MED ORDER — POLYETHYLENE GLYCOL 3350 17 G PO PACK: 17.0000 g | PACK | Freq: Every day | ORAL | 0 refills | Status: DC | PRN

## 2020-01-22 MED ORDER — OXYCODONE HCL 10 MG PO TABS: 10.0000 mg | ORAL_TABLET | ORAL | 0 refills | Status: DC | PRN

## 2020-01-22 MED ORDER — VITAMIN D 50 MCG (2000 UT) PO TABS: 5000.0000 [IU] | ORAL_TABLET | Freq: Two times a day (BID) | ORAL | 0 refills | Status: DC

## 2020-01-22 MED ORDER — AMLODIPINE BESYLATE 10 MG PO TABS: 10.0000 mg | ORAL_TABLET | Freq: Every day | ORAL | 0 refills | Status: DC

## 2020-01-22 MED ORDER — DIAZEPAM 5 MG PO TABS: 5.0000 mg | ORAL_TABLET | Freq: Four times a day (QID) | ORAL | 0 refills | Status: DC | PRN

## 2020-01-22 NOTE — Telephone Encounter (Signed)
Transitional Care call  Patient name: Misty Arnold  DOB: [birth year] 1. Are you/is patient experiencing any problems since coming home? No a. Are there any questions regarding any aspect of care? No 2. Are there any questions regarding medications administration/dosing? Yes, [name] wasn't able to pick up her medication the day of discharge, she reports [facility] was closed. This provider placed a call to [facility], her medications were on hold. Medication list reviewed with [facility]. Her medications e-scribed to [facility] in [place], per [name] request she verbalizes understanding.  a. Are meds being taken as prescribed? No, see above. Medications e-scribed today.  b. "Patient should review meds with caller to confirm" Medication list reviewed.  3. Have there been any falls? No 4. Has Home Health been to the house and/or have they contacted you? According to [name] LCSW note she was unable to established before [name] was discharged. [name] was instructed to call [facility] today regarding the above, and to call our office on Monday 01/25/2020, if she hasn't heard from [facility], she verbalizes understanding.  a. If not, have you tried to contact them? See above b. Can we help you contact them? See above 5. Are bowels and bladder emptying properly? Yes a. Are there any unexpected incontinence issues? No b. If applicable, is patient following bowel/bladder programs? No 6. Any fevers, problems with breathing, unexpected pain? No 7. Are there any skin problems or new areas of breakdown? She is performing wound care on right mastectomy site as instructed.  8. Has the patient/family member arranged specialty MD follow up (ie cardiology/neurology/renal/surgical/etc.)?  [name] was instructed to call Dr. Annette Stable office to schedule HFU appointment she verbalizes understanding.  a. Can we help arrange? No 9. Does the patient need any other services or support that we can  help arrange? No 10. Are caregivers following through as expected in assisting the patient? Yes 11. Has the patient quit smoking, drinking alcohol, or using drugs as recommended? (                        )  Appointment date/time 02/01/2020  arrival time 1:40 for 2:00 appointment with Dr Dagoberto Ligas at   [place]

## 2020-01-22 NOTE — Telephone Encounter (Signed)
SW received call from pt who was inquiring about [facility]. SW informed no success at [facility] due to insurance and/or location. SW informed will follow-up once there are more updates.   *SW was contacted by Kathlee Nations, RN with Dr. Geralyn Flash office in which they state pt called office to inform. SW indicated will follow up if there is more information.

## 2020-01-22 NOTE — Telephone Encounter (Signed)
RN collaborated with SW regarding home health needs.  RN spoke with [facility], they agreed to review patient's case to review for PT/OT/SN.    RN successfully faxed over referral with progress notes to [phone].  Per Myriam Jacobson, at [facility], they will review and hopefully have assessment lined up early next week.    RN notified patient, she verbalized understanding.  RN encouraged if she has not been contacted by home health by early next week to notify clinic.    [facility] contact number of [phone] given to patient as well.

## 2020-01-25 ENCOUNTER — Telehealth: Payer: Self-pay

## 2020-01-25 ENCOUNTER — Ambulatory Visit
Admission: RE | Admit: 2020-01-25 | Discharge: 2020-01-25 | Disposition: A | Discharge status: Home / Self Care | Payer: Medicare PPO | Source: Ambulatory Visit | Attending: Radiation Oncology | Admitting: Radiation Oncology

## 2020-01-25 DIAGNOSIS — Z510 Encounter for antineoplastic radiation therapy: Secondary | ICD-10-CM | POA: Insufficient documentation

## 2020-01-25 DIAGNOSIS — C7949 Secondary malignant neoplasm of other parts of nervous system: Secondary | ICD-10-CM | POA: Insufficient documentation

## 2020-01-25 DIAGNOSIS — Z7901 Long term (current) use of anticoagulants: Secondary | ICD-10-CM | POA: Insufficient documentation

## 2020-01-25 DIAGNOSIS — D72819 Decreased white blood cell count, unspecified: Secondary | ICD-10-CM | POA: Insufficient documentation

## 2020-01-25 DIAGNOSIS — Z170 Estrogen receptor positive status [ER+]: Secondary | ICD-10-CM | POA: Insufficient documentation

## 2020-01-25 DIAGNOSIS — C50411 Malignant neoplasm of upper-outer quadrant of right female breast: Secondary | ICD-10-CM | POA: Insufficient documentation

## 2020-01-25 DIAGNOSIS — Z79899 Other long term (current) drug therapy: Secondary | ICD-10-CM | POA: Insufficient documentation

## 2020-01-25 NOTE — Telephone Encounter (Signed)
SW followed up with [facility] to confirm if able to accept pt based on notes. States unable to service as pt not in service area.   SW called [facility] [phone]) unable to accept as do not service area.   SW sent referral for wound care to [facility] (p:[phone]/[phone]). SW waiting on follow-up.  SW spoke with pt to provide updates on above about possible wound care clinic, and SW will discuss if outpatient is an option with medical team. Pt states she has an appt in [place] today and will ask physician for supplies.   SW spoke with RN Katheren Puller on above who has helped with efforts to find [facility]. SW indicated will update if able to get pt wound care.   SW left msg Jill/Dr.Kinard's office asking if pt can be sent home with supplies for wound care due to issues with locating [facility]. SW waiting on follow-up.

## 2020-01-26 ENCOUNTER — Ambulatory Visit
Admission: RE | Admit: 2020-01-26 | Discharge: 2020-01-26 | Disposition: A | Discharge status: Home / Self Care | Payer: Medicare PPO | Source: Ambulatory Visit | Attending: Radiation Oncology | Admitting: Radiation Oncology

## 2020-01-26 ENCOUNTER — Ambulatory Visit: Payer: Medicare PPO | Admitting: Radiation Oncology

## 2020-01-26 ENCOUNTER — Encounter: Payer: Self-pay | Admitting: Licensed Clinical Social Worker

## 2020-01-26 ENCOUNTER — Other Ambulatory Visit: Payer: Self-pay

## 2020-01-26 DIAGNOSIS — Z79899 Other long term (current) drug therapy: Secondary | ICD-10-CM | POA: Diagnosis not present

## 2020-01-26 DIAGNOSIS — C7949 Secondary malignant neoplasm of other parts of nervous system: Secondary | ICD-10-CM | POA: Diagnosis not present

## 2020-01-26 DIAGNOSIS — Z7901 Long term (current) use of anticoagulants: Secondary | ICD-10-CM | POA: Diagnosis not present

## 2020-01-26 DIAGNOSIS — D72819 Decreased white blood cell count, unspecified: Secondary | ICD-10-CM | POA: Diagnosis not present

## 2020-01-26 DIAGNOSIS — C50411 Malignant neoplasm of upper-outer quadrant of right female breast: Secondary | ICD-10-CM

## 2020-01-26 DIAGNOSIS — Z510 Encounter for antineoplastic radiation therapy: Secondary | ICD-10-CM | POA: Diagnosis present

## 2020-01-26 DIAGNOSIS — Z170 Estrogen receptor positive status [ER+]: Secondary | ICD-10-CM | POA: Diagnosis not present

## 2020-01-26 NOTE — Progress Notes (Signed)
  Radiation Oncology         (336) [phone] ________________________________  Name: Yuan Piela MRN: [id number]  Date: 01/26/2020  DOB: [birth year]  SIMULATION AND TREATMENT PLANNING NOTE    ICD-10-CM   1. 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 Malignant neoplasm of upper-outer quadrant of right female breast, unspecified estrogen receptor status ([place])  C50.411     DIAGNOSIS: Metastatic poorly differentiated carcinoma  NARRATIVE:  The patient was brought to the [facility].  Identity was confirmed.  All relevant records and images related to the planned course of therapy were reviewed.  The patient freely provided informed written consent to proceed with treatment after reviewing the details related to the planned course of therapy. The consent form was witnessed and verified by the simulation staff.  Then, the patient was set-up in a stable reproducible supine position for radiation therapy.  CT images were obtained.  Surface markings were placed.  The CT images were loaded into the planning software.  Then the target and avoidance structures were contoured.  Treatment planning then occurred.  The radiation prescription was entered and confirmed.  Then, I designed and supervised the construction of a total of 5 medically necessary complex treatment devices.  I have requested : 3D Simulation  I have requested a DVH of the following structures: spinal cord esophagus, lungs, heart.  I have ordered:dose calc.  PLAN:  The patient will receive 35 Gy in 14 fractions directed at the surgical bed in the mid thoracic spine area. treatments to begin tomorrow.  -----------------------------------  Blair Promise, PhD, MD  This document serves as a record of services personally performed by Gery Pray, MD. It was created on his behalf by Clerance Lav, a trained medical scribe. The creation of this record is based on the scribe's personal observations and the  provider's statements to them. This document has been checked and approved by the attending provider.

## 2020-01-26 NOTE — Progress Notes (Signed)
[place] Work  Clinical Social Work was referred by radiation therapist for assessment of psychosocial needs.  Clinical Social Worker met with patient  to offer support and assess for needs.  Patient tearful during visit, explaining that she is under significant stress with driving 2 hours each way back and forth to [facility] every day for the next few weeks for radiation and having to rely on a friend or family member to drive her. She does not want to stress them and is not used to having to rely on others. She does want all of her care here as she feels she receives better care than in her home town of [place], [place] (she notes that they have not started her PT, OT, or wound care yet).    Patient interested in places she can stay even for a couple of days a week during her radiation treatment. CSW will look into resources and touch base with patient during visit tomorrow. Will also sign up for [employer] and look into other foundation assistance.  CSW provided card with direct contact information.    Maxwell Lemen, [place], [place] Worker [facility]

## 2020-01-26 NOTE — Progress Notes (Signed)
  Radiation Oncology         (336) [phone] ________________________________  Name: Kambre Silva MRN: [id number]  Date: 01/27/2020  DOB: 1971/10/19  Simulation Verification Note    ICD-10-CM   1. 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 The clinical setup was verified. Then port films were obtained and uploaded to the radiation oncology medical record software.  The treatment beams were carefully compared against the planned radiation fields. The position location and shape of the radiation fields was reviewed. They targeted volume of tissue appears to be appropriately covered by the radiation beams. Organs at risk appear to be excluded as planned.  Based on my personal review, I approved the simulation verification. The patient's treatment will proceed as planned.  -----------------------------------  Blair Promise, PhD, MD  This document serves as a record of services personally performed by Gery Pray, MD. It was created on his behalf by Clerance Lav, a trained medical scribe. The creation of this record is based on the scribe's personal observations and the provider's statements to them. This document has been checked and approved by the attending provider.

## 2020-01-26 NOTE — Progress Notes (Signed)
Radiation Oncology         [phone] ________________________________  Name: Misty Arnold MRN: [id number]  Date: 01/26/2020  DOB: Sep 12, 1971  Re-evaluation note  CC: Patient, [name]  Misty Larsson, MD    ICD-10-CM   1. 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 Malignant neoplasm of upper-outer quadrant of right female breast, unspecified estrogen receptor status ([place])  C50.411     Diagnosis:   Metastatic poorly differentiated carcinoma    Narrative:  The patient returns today for planning of her postop radiation therapy.  She recently was discharged from the inpatient rehabilitation unit..           The patient underwent T7-T8 and T9-T10 decompressive laminectomy with resection of tumor including right T9 transpedicular decompression with section of tumor and microdissection on 01/04/2020 under Dr. Annette Stable. Pathology report showed metastatic poorly differentiated carcinoma.                  ALLERGIES:  is allergic to amoxicillin; penicillins; pork-derived products; and tamoxifen.  Meds: Current Outpatient Medications  Medication Sig Dispense Refill  . acetaminophen (TYLENOL) 325 MG tablet Take 2 tablets (650 mg total) by mouth every 4 (four) hours as needed for mild pain ((score 1 to 3) or temp > 100.5). 30 tablet 0  . amLODipine (NORVASC) 10 MG tablet Take 1 tablet (10 mg total) by mouth daily. 30 tablet 0  . Ascorbic Acid (VITAMIN C PO) Take by mouth.    . Bioflavonoid Products (ESTER C PO) Take 1 tablet by mouth with breakfast, with lunch, and with evening meal.     . bisacodyl (DULCOLAX) 10 MG suppository Place 1 suppository (10 mg total) rectally daily as needed for moderate constipation. 12 suppository 0  . BLACK COHOSH EXTRACT PO Take 1 tablet by mouth in the morning, at noon, and at bedtime.    Marland Kitchen BLACK CURRANT SEED OIL PO Take by mouth.    . calcium carbonate (OS-CAL - DOSED IN MG OF ELEMENTAL CALCIUM) 1250 (500 Ca) MG tablet Take 1 tablet by mouth 3  (three) times daily with meals.    . calcium-vitamin D (OSCAL WITH D) 500-200 MG-UNIT tablet Take 1 tablet by mouth 2 (two) times daily with a meal. (Patient not taking: Reported on 01/04/2020) 180 tablet 1  . Cholecalciferol (VITAMIN D) 50 MCG (2000 UT) tablet Take 2.5 tablets (5,000 Units total) by mouth 2 (two) times daily. 60 tablet 0  . dexamethasone (DECADRON) 0.5 MG tablet Take 4 tablets (2 mg total) by mouth every 6 (six) hours for 5 days. 80 tablet 0  . diazepam (VALIUM) 5 MG tablet Take 1-2 tablets (5-10 mg total) by mouth every 6 (six) hours as needed for muscle spasms. 30 tablet 0  . enoxaparin (LOVENOX) 40 MG/0.4ML injection Lovenox 40 mg subcutaneous daily through 02/22/2020 and stop 0.4 mL 1  . Flaxseed Oil OIL Take 5 mLs by mouth in the morning, at noon, and at bedtime.     Marland Kitchen goserelin (ZOLADEX) 3.6 MG injection Inject 3.6 mg into the skin every 28 (twenty-eight) days.    Marland Kitchen letrozole (FEMARA) 2.5 MG tablet Take 1 tablet (2.5 mg total) by mouth daily. 90 tablet 1  . Oxycodone HCl 10 MG TABS Take 1 tablet (10 mg total) by mouth every 3 (three) hours as needed ((score 7 to 10)). 30 tablet 0  . polyethylene glycol (MIRALAX / GLYCOLAX) 17 g packet Take 17 g by mouth daily as  needed for mild constipation. 14 each 0   No current facility-administered medications for this encounter.   Facility-Administered Medications Ordered in Other Encounters  Medication Dose Route Frequency Provider Last Rate Last Admin  . goserelin (ZOLADEX) injection 3.6 mg  3.6 mg Subcutaneous Once Nicholas Lose, MD        Physical Findings: The patient is in no acute distress. Patient is alert and oriented.  Sitting comfortably in wheelchair.  Examination of the back area reveals Steri-Strips in place along the surgical bed.  No signs of infection or drainage.     Lab Findings: Lab Results  Component Value Date   WBC 23.5 (H) 01/19/2020   HGB 10.1 (L) 01/19/2020   HCT 31.3 (L) 01/19/2020   MCV 96.6  01/19/2020   PLT 427 (H) 01/19/2020    Radiographic Findings: DG Thoracic Spine 2 View  Result Date: 01/04/2020 CLINICAL DATA:  T8-9 laminectomy for tumor. T7-10 instrumentation. EXAM: THORACIC SPINE 2 VIEWS; DG C-ARM 1-60 MIN COMPARISON:  01/04/2020 MRI thoracic spine. FLUOROSCOPY TIME:  Fluoroscopy Time:  1 minutes 0 seconds Number of Acquired Spot Images: 2 FINDINGS: Nondiagnostic AP and lateral intraoperative fluoroscopic thoracic spine radiographs demonstrate placement of bilateral pedicle screws at multiple thoracic spine levels, numbering of which is not possible on these coned views. Temperature probe noted in the thoracic esophagus. IMPRESSION: Intraoperative fluoroscopic guidance for thoracic spine surgery. Electronically Signed   By: Ilona Sorrel M.D.   On: 01/04/2020 16:40   MR THORACIC SPINE W WO CONTRAST  Result Date: 01/04/2020 CLINICAL DATA:  Progressive sensation and weakness deficit in bilateral lower extremity right greater than left with saddle anesthesia. 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 EXAM: MRI THORACIC WITHOUT AND WITH CONTRAST TECHNIQUE: Multiplanar and multiecho pulse sequences of the thoracic spine were obtained without and with intravenous contrast. CONTRAST:  4.47mL GADAVIST GADOBUTROL 1 MMOL/ML IV SOLN COMPARISON:  CT of the chest August 21, 2019 FINDINGS: MRI THORACIC SPINE FINDINGS Alignment:  Physiologic. Correlation with prior CT show 12 rib-bearing vertebrae. The first rib bearing vertebra shows rudimentary ribs and is named T1 in this study, consistent with prior study numbering. Per this counting, there would be 6 cervical, 12 thoracic and 5 lumber type vertebrae. Careful correlation with this numbering strategy prior to any procedural intervention would be recommended. Vertebrae: There has been significant interval growth of the mass lesion involving the T8 and T9 vertebrae corresponding to known metastatic disease. The expansile lesion involves most of the T8 and T9  vertebral body including pedicles and posterior elements extending into the spinal canal causing severe spinal canal stenosis and compression of the spinal cord which shows increased signal from the level of T8 through T10. There is also extension of the lesion to the ribcage on the right side, from T7 through T9, with severe narrowing of the right neural foramen from T7-8 through T9-10. Cord: Cord compression with increased T2 signal from T8 through T10, consistent with compressive myelopathy. Paraspinal and other soft tissues: Incidentally noted and partially the study is expansile mass lesion in the anterior chest wall centered in the sternum, measuring approximately 7 x 5 x 3 cm, consistent with known metastatic lesion. Multiple small T2 hyperintense lesions in the liver are incidentally noted. Disc levels: Severe spinal canal stenosis with cord compression related to expansile lesion described above at the T8-T9 level with severe right-sided neural foraminal stenosis from T7 through T9. No significant disc bulge or herniation, spinal canal or neural foraminal stenosis in the other levels.  IMPRESSION: 1. Please note this counting strategy prior to any procedural intervention would be recommended. 2. 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 3. Expansile mass in the anterior chest wall centered in the sternum, consistent with known metastatic lesion. Communicated to [employer] PA at 10:20 a.m. on 01/04/2020. Electronically Signed   By: Pedro Earls M.D.   On: 01/04/2020 10:25   MR LUMBAR SPINE W WO CONTRAST  Result Date: 01/04/2020 CLINICAL DATA:  Progressive neurological deficit in the bilateral lower extremity right greater than left. Question of chronic spinal  compression. 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 EXAM: MRI LUMBAR SPINE WITHOUT AND WITH CONTRAST TECHNIQUE: Multiplanar and multiecho pulse sequences of the lumbar spine were obtained without and with intravenous contrast. CONTRAST:  4.76mL GADAVIST GADOBUTROL 1 MMOL/ML IV SOLN COMPARISON:  CT of the abdomen August 21 2019 FINDINGS: Segmentation:  Standard. Alignment:  Physiologic. Vertebrae: No fracture, evidence of discitis, or bone lesion. 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 Conus medullaris and cauda equina: Conus extends to the L2 level. Conus and cauda equina appear normal. Paraspinal and other soft tissues: Negative. Disc levels: T12-L1 through L3-4: No spinal canal or neural foraminal stenosis. L4-5: Shallow disc bulge with superimposed small left subarticular/foraminal disc protrusion causing mild narrowing of the left neural foramen and left subarticular zone. L5-S1: No spinal canal or neural foraminal stenosis. IMPRESSION: 1. No cauda equina compression or abnormal contrast enhancement. 2. 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 3. Mild degenerative disc disease at L4-L5 with mild narrowing of the left neural foramen and left subarticular zone. No high-grade spinal canal or neural foraminal stenosis. Electronically Signed   By: Pedro Earls M.D.   On: 01/04/2020 09:37   DG CHEST PORT 1 VIEW  Result Date: 01/14/2020 CLINICAL DATA:  Leukocytosis. EXAM: PORTABLE CHEST 1 VIEW COMPARISON:  None. FINDINGS: The heart size and mediastinal contours are within normal limits. Both lungs are clear. No pneumothorax or pleural  effusion is noted. Status post surgical internal fixation of the thoracic spine. IMPRESSION: No active disease. Electronically Signed   By: Marijo Conception M.D.   On: 01/14/2020 11:53   DG C-Arm 1-60 Min  Result Date: 01/04/2020 CLINICAL DATA:  T8-9 laminectomy for tumor. T7-10 instrumentation. EXAM: THORACIC SPINE 2 VIEWS; DG C-ARM 1-60 MIN COMPARISON:  01/04/2020 MRI thoracic spine. FLUOROSCOPY TIME:  Fluoroscopy Time:  1 minutes 0 seconds Number of Acquired Spot Images: 2 FINDINGS: Nondiagnostic AP and lateral intraoperative fluoroscopic thoracic spine radiographs demonstrate placement of bilateral pedicle screws at multiple thoracic spine levels, numbering of which is not possible on these coned views. Temperature probe noted in the thoracic esophagus. IMPRESSION: Intraoperative fluoroscopic guidance for thoracic spine surgery. Electronically Signed   By: Ilona Sorrel M.D.   On: 01/04/2020 16:40   VAS [name] LOWER EXTREMITY VENOUS (DVT)  Result Date: 01/12/2020  Lower Venous DVTStudy Indications: Pain.  Performing Technologist: Teague, RVT  Examination Guidelines: A complete evaluation includes  B-mode imaging, spectral Doppler, color Doppler, and power Doppler as needed of all accessible portions of each vessel. Bilateral testing is considered an integral part of a complete examination. Limited examinations for reoccurring indications may be performed as noted. The reflux portion of the exam is performed with the patient in reverse Trendelenburg.  +---------+---------------+---------+-----------+----------+--------------+ RIGHT    CompressibilityPhasicitySpontaneityPropertiesThrombus Aging +---------+---------------+---------+-----------+----------+--------------+ CFV      Full           Yes      Yes                                 +---------+---------------+---------+-----------+----------+--------------+ SFJ      Full                                                         +---------+---------------+---------+-----------+----------+--------------+ FV Prox  Full                                                        +---------+---------------+---------+-----------+----------+--------------+ FV Mid   Full                                                        +---------+---------------+---------+-----------+----------+--------------+ FV DistalFull                                                        +---------+---------------+---------+-----------+----------+--------------+ PFV      Full                                                        +---------+---------------+---------+-----------+----------+--------------+ POP      Full           Yes      Yes                                 +---------+---------------+---------+-----------+----------+--------------+ PTV      Full                                                        +---------+---------------+---------+-----------+----------+--------------+ PERO     Full                                                        +---------+---------------+---------+-----------+----------+--------------+  GSV      Full                                                        +---------+---------------+---------+-----------+----------+--------------+   +---------+---------------+---------+-----------+----------+--------------+ LEFT     CompressibilityPhasicitySpontaneityPropertiesThrombus Aging +---------+---------------+---------+-----------+----------+--------------+ CFV      Full           Yes      Yes                                 +---------+---------------+---------+-----------+----------+--------------+ SFJ      Full                                                        +---------+---------------+---------+-----------+----------+--------------+ FV Prox  Full                                                         +---------+---------------+---------+-----------+----------+--------------+ FV Mid   Full                                                        +---------+---------------+---------+-----------+----------+--------------+ FV DistalFull                                                        +---------+---------------+---------+-----------+----------+--------------+ PFV      Full                                                        +---------+---------------+---------+-----------+----------+--------------+ POP      Full           Yes      Yes                                 +---------+---------------+---------+-----------+----------+--------------+ PTV      Full                                                        +---------+---------------+---------+-----------+----------+--------------+ PERO     Full                                                        +---------+---------------+---------+-----------+----------+--------------+  GSV      Full                                                        +---------+---------------+---------+-----------+----------+--------------+     Summary: RIGHT: - There is no evidence of deep vein thrombosis in the lower extremity.  - No cystic structure found in the popliteal fossa.  LEFT: - There is no evidence of deep vein thrombosis in the lower extremity.  - No cystic structure found in the popliteal fossa.  *See table(s) above for measurements and observations. Electronically signed by Harold Barban MD on 01/12/2020 at 57:48:42 PM.    Final     Impression:  Metastatic poorly differentiated carcinoma.  Patient underwent successful decompression laminectomy along the mid thoracic spine area.  She is now ready to proceed with postop radiation therapy to this region.  I discussed the general course of treatment side effects and potential toxicities of radiation therapy in this situation with the patient.  She appears to understand and  wishes to proceed with planned course of treatment.  Plan: CT simulation this afternoon with treatments to begin tomorrow.  Patient will receive 35 Gy in 14 fractions directed at the surgical bed in the mid thoracic spine region.  ____________________________________ Gery Pray, MD

## 2020-01-26 NOTE — Progress Notes (Signed)
After several attempts at different agencies, we were unable to locate a home health agency that was within network and provided coverage to patients area.    RN collaborated with inpatient SW.   Wound care clinic and outpatient PT/OT is being worked up for patient.  Patient updated, voiced understanding.

## 2020-01-27 ENCOUNTER — Other Ambulatory Visit: Payer: Self-pay

## 2020-01-27 ENCOUNTER — Telehealth: Payer: Self-pay

## 2020-01-27 ENCOUNTER — Inpatient Hospital Stay: Payer: Medicare PPO | Admitting: Licensed Clinical Social Worker

## 2020-01-27 ENCOUNTER — Ambulatory Visit: Payer: Medicare PPO

## 2020-01-27 ENCOUNTER — Ambulatory Visit
Admission: RE | Admit: 2020-01-27 | Discharge: 2020-01-27 | Disposition: A | Discharge status: Home / Self Care | Payer: Medicare PPO | Source: Ambulatory Visit | Attending: Radiation Oncology | Admitting: Radiation Oncology

## 2020-01-27 DIAGNOSIS — Z170 Estrogen receptor positive status [ER+]: Secondary | ICD-10-CM

## 2020-01-27 DIAGNOSIS — C7949 Secondary malignant neoplasm of other parts of nervous system: Secondary | ICD-10-CM

## 2020-01-27 DIAGNOSIS — C50411 Malignant neoplasm of upper-outer quadrant of right female breast: Secondary | ICD-10-CM

## 2020-01-27 DIAGNOSIS — Z510 Encounter for antineoplastic radiation therapy: Secondary | ICD-10-CM | POA: Diagnosis not present

## 2020-01-27 NOTE — Progress Notes (Signed)
[place] CSW Progress Note  Holiday representative met with patient in exam room to review options for lodging and financial assistance. Signed application for [facility] although it is currently full & submitted to spiritual care department. CSW gave options for hotels with discount through [facility] for patient to look through.   For financial assistance, signed up for [employer] and gave first disbursement. Signed up for and given [employer] gift card. Completed application for [facility] faxed in with medical letter. Began application online for [employer] which patient will receive an e-mail to complete.  Patient to contact CSW if needing further help with booking hotel room. CSW will check-in in about 1 week to provide next [employer].   Edwinna Areola Janalyn Higby LCSW

## 2020-01-27 NOTE — Telephone Encounter (Signed)
01/26/2020- SW confirmed with Cyril Mourning with [facility] wound care clinic pt was accepted, and have reached out to pt. Scheduled appt on Thursday at 1pm.   01/27/2020- SW called pt this morning to confirm above. SW discussed outpatient therapies. Pt states closest hospital is [place]. SW  Faxed outpatient PT/OT referral to [facility] (p:[phone]/f:[phone]).

## 2020-01-27 NOTE — Telephone Encounter (Signed)
Patient called and said she missed a phone call from [place] office this AM [phone]

## 2020-01-28 ENCOUNTER — Inpatient Hospital Stay: Payer: Medicare PPO

## 2020-01-28 ENCOUNTER — Inpatient Hospital Stay: Payer: Medicare PPO | Admitting: Hematology and Oncology

## 2020-01-28 ENCOUNTER — Ambulatory Visit
Admission: RE | Admit: 2020-01-28 | Discharge: 2020-01-28 | Disposition: A | Discharge status: Home / Self Care | Payer: Medicare PPO | Source: Ambulatory Visit | Attending: Radiation Oncology | Admitting: Radiation Oncology

## 2020-01-28 ENCOUNTER — Other Ambulatory Visit: Payer: Self-pay | Admitting: Hematology and Oncology

## 2020-01-28 ENCOUNTER — Other Ambulatory Visit: Payer: Self-pay

## 2020-01-28 ENCOUNTER — Encounter: Payer: Self-pay | Admitting: Licensed Clinical Social Worker

## 2020-01-28 ENCOUNTER — Telehealth: Payer: Self-pay

## 2020-01-28 VITALS — BP 130/80 | HR 104 | Temp 98.9°F | Resp 18

## 2020-01-28 DIAGNOSIS — Z9011 Acquired absence of right breast and nipple: Secondary | ICD-10-CM | POA: Diagnosis not present

## 2020-01-28 DIAGNOSIS — Z170 Estrogen receptor positive status [ER+]: Secondary | ICD-10-CM

## 2020-01-28 DIAGNOSIS — C50411 Malignant neoplasm of upper-outer quadrant of right female breast: Secondary | ICD-10-CM | POA: Diagnosis present

## 2020-01-28 DIAGNOSIS — C7951 Secondary malignant neoplasm of bone: Secondary | ICD-10-CM | POA: Diagnosis not present

## 2020-01-28 DIAGNOSIS — Z79811 Long term (current) use of aromatase inhibitors: Secondary | ICD-10-CM | POA: Diagnosis not present

## 2020-01-28 DIAGNOSIS — Z510 Encounter for antineoplastic radiation therapy: Secondary | ICD-10-CM | POA: Diagnosis not present

## 2020-01-28 LAB — CMP (CANCER CENTER ONLY)
ALT: 45 U/L — ABNORMAL HIGH (ref 0–44)
AST: 19 U/L (ref 15–41)
Albumin: 3.4 g/dL — ABNORMAL LOW (ref 3.5–5.0)
Alkaline Phosphatase: 112 U/L (ref 38–126)
Anion gap: 13 (ref 5–15)
BUN: 18 mg/dL (ref 6–20)
CO2: 24 mmol/L (ref 22–32)
Calcium: 9 mg/dL (ref 8.9–10.3)
Chloride: 105 mmol/L (ref 98–111)
Creatinine: 0.68 mg/dL (ref 0.44–1.00)
GFR, Est AFR Am: >60 mL/min (ref >60)
GFR, Estimated: >60 mL/min (ref >60)
Glucose, Bld: 215 mg/dL — ABNORMAL HIGH (ref 70–99)
Potassium: 3.5 mmol/L (ref 3.5–5.1)
Sodium: 142 mmol/L (ref 135–145)
Total Bilirubin: 0.2 mg/dL — ABNORMAL LOW (ref 0.3–1.2)
Total Protein: 6.3 g/dL — ABNORMAL LOW (ref 6.5–8.1)

## 2020-01-28 LAB — CBC WITH DIFFERENTIAL (CANCER CENTER ONLY)
Abs Immature Granulocytes: 0.21 10*3/uL — ABNORMAL HIGH (ref 0.00–0.07)
Basophils Absolute: 0 10*3/uL (ref 0.0–0.1)
Basophils Relative: 0 %
Eosinophils Absolute: 0 10*3/uL (ref 0.0–0.5)
Eosinophils Relative: 0 %
HCT: 34.5 % — ABNORMAL LOW (ref 36.0–46.0)
Hemoglobin: 11 g/dL — ABNORMAL LOW (ref 12.0–15.0)
Immature Granulocytes: 2 %
Lymphocytes Relative: 9 %
Lymphs Abs: 0.9 10*3/uL (ref 0.7–4.0)
MCH: 31.3 pg (ref 26.0–34.0)
MCHC: 31.9 g/dL (ref 30.0–36.0)
MCV: 98 fL (ref 80.0–100.0)
Monocytes Absolute: 0.5 10*3/uL (ref 0.1–1.0)
Monocytes Relative: 5 %
Neutro Abs: 8.6 10*3/uL — ABNORMAL HIGH (ref 1.7–7.7)
Neutrophils Relative %: 84 %
Platelet Count: 194 10*3/uL (ref 150–400)
RBC: 3.52 MIL/uL — ABNORMAL LOW (ref 3.87–5.11)
RDW: 16.8 % — ABNORMAL HIGH (ref 11.5–15.5)
WBC Count: 10.3 10*3/uL (ref 4.0–10.5)
nRBC: 0 % (ref 0.0–0.2)

## 2020-01-28 MED ORDER — GOSERELIN ACETATE 3.6 MG ~~LOC~~ IMPL
DRUG_IMPLANT | SUBCUTANEOUS | Status: AC
  Filled 2020-01-28: qty 3.6

## 2020-01-28 MED ORDER — GOSERELIN ACETATE 3.6 MG ~~LOC~~ IMPL
3.6000 mg | DRUG_IMPLANT | Freq: Once | SUBCUTANEOUS | Status: AC
  Administered 2020-01-28: 3.6 mg via SUBCUTANEOUS

## 2020-01-28 NOTE — Assessment & Plan Note (Deleted)
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  PET/CT scan 11/12/2014 did not show metastatic disease but showed extensive involvement in the right breast in an around the implant Stage 3B  Right mastectomy 08/29/2015 at [place] (Dr.Singh): IDC with invol of skin and skin ulceration, breast capsule inv cancer, superior medial margin positive, 1/1 LN positive, grade 3, 14.3 cm, 3.5 cm, ALI, chest wall involv, ER 90-100%, PR 80-90%, HER-2 negative, Ki 67 40-50% T4CN1 (St 3B)  Treatment summary:(Patient is premenopausal based on blood work done May 2017, estradiol 250, FSH 5.4) 1. Patient started tamoxifen but developed severe headache with severe hypertension and tamoxifen was discontinued 03/17/2016. 2. 02/2017 started on Exemestane, Zoladex started 05/29/2017, she does have some joint aches and pains 3.  Bulky metastatic disease 08/21/2019: [facility] with Faslodex   4.  Hospitalization 01/08/2020-01/20/2020 paraplegia.  T8-T9 vertebral body tumor with spinal cord compression status post decompressive laminectomy.  Currently on radiation ---------------------------------------------------------------------------------------------------------------------------------------------------------- Current treatment: Once radiation is completed she will start Ibrance with letrozole. Major social barriers to health: Patient does not have reliable transportation and very poor family support. Fungating tumor in the breast: Home health/wound care

## 2020-01-28 NOTE — Telephone Encounter (Signed)
Telephone notes was reviewed, no documentation of a call being placed. She has an appointment with Dr Dagoberto Ligas on 02/01/2020. Placed a call to Misty Arnold regarding the above, she verbalizes understanding.

## 2020-01-28 NOTE — Telephone Encounter (Signed)
SW confirmed with [facility] about outpatient PT/OT ([phone]/f:[phone]). States referral received and called pt and left a message. States pt will not be seen until 4/13. SW informed will call pt to have her call about appointment. SW spoke with pt to discuss above. SW offered pt contact information. Pt stated she had it already as it is a location her mother had gone.  No further SW intervention required.

## 2020-01-28 NOTE — Patient Instructions (Signed)

## 2020-01-29 ENCOUNTER — Other Ambulatory Visit: Payer: Self-pay

## 2020-01-29 ENCOUNTER — Ambulatory Visit
Admission: RE | Admit: 2020-01-29 | Discharge: 2020-01-29 | Disposition: A | Discharge status: Home / Self Care | Payer: Medicare PPO | Source: Ambulatory Visit | Attending: Radiation Oncology | Admitting: Radiation Oncology

## 2020-01-29 DIAGNOSIS — Z510 Encounter for antineoplastic radiation therapy: Secondary | ICD-10-CM | POA: Diagnosis not present

## 2020-02-01 ENCOUNTER — Ambulatory Visit
Admission: RE | Admit: 2020-02-01 | Discharge: 2020-02-01 | Disposition: A | Discharge status: Home / Self Care | Payer: Medicare PPO | Source: Ambulatory Visit | Attending: Radiation Oncology | Admitting: Radiation Oncology

## 2020-02-01 ENCOUNTER — Encounter: Payer: Medicare PPO | Admitting: Physical Medicine and Rehabilitation

## 2020-02-01 ENCOUNTER — Other Ambulatory Visit: Payer: Self-pay

## 2020-02-01 DIAGNOSIS — Z510 Encounter for antineoplastic radiation therapy: Secondary | ICD-10-CM | POA: Diagnosis not present

## 2020-02-02 ENCOUNTER — Ambulatory Visit
Admission: RE | Admit: 2020-02-02 | Discharge: 2020-02-02 | Disposition: A | Discharge status: Home / Self Care | Payer: Medicare PPO | Source: Ambulatory Visit | Attending: Radiation Oncology | Admitting: Radiation Oncology

## 2020-02-02 ENCOUNTER — Other Ambulatory Visit: Payer: Self-pay

## 2020-02-02 DIAGNOSIS — Z510 Encounter for antineoplastic radiation therapy: Secondary | ICD-10-CM | POA: Diagnosis not present

## 2020-02-03 ENCOUNTER — Other Ambulatory Visit: Payer: Self-pay

## 2020-02-03 ENCOUNTER — Ambulatory Visit
Admission: RE | Admit: 2020-02-03 | Discharge: 2020-02-03 | Disposition: A | Discharge status: Home / Self Care | Payer: Medicare PPO | Source: Ambulatory Visit | Attending: Radiation Oncology | Admitting: Radiation Oncology

## 2020-02-03 DIAGNOSIS — Z510 Encounter for antineoplastic radiation therapy: Secondary | ICD-10-CM | POA: Diagnosis not present

## 2020-02-04 ENCOUNTER — Ambulatory Visit
Admission: RE | Admit: 2020-02-04 | Discharge: 2020-02-04 | Disposition: A | Discharge status: Home / Self Care | Payer: Medicare PPO | Source: Ambulatory Visit | Attending: Radiation Oncology | Admitting: Radiation Oncology

## 2020-02-04 ENCOUNTER — Other Ambulatory Visit: Payer: Self-pay

## 2020-02-04 ENCOUNTER — Encounter: Payer: Self-pay | Admitting: Licensed Clinical Social Worker

## 2020-02-04 ENCOUNTER — Inpatient Hospital Stay: Payer: Medicare PPO | Admitting: Licensed Clinical Social Worker

## 2020-02-04 DIAGNOSIS — Z170 Estrogen receptor positive status [ER+]: Secondary | ICD-10-CM | POA: Diagnosis not present

## 2020-02-04 DIAGNOSIS — Z7901 Long term (current) use of anticoagulants: Secondary | ICD-10-CM | POA: Insufficient documentation

## 2020-02-04 DIAGNOSIS — Z510 Encounter for antineoplastic radiation therapy: Secondary | ICD-10-CM | POA: Insufficient documentation

## 2020-02-04 DIAGNOSIS — C50411 Malignant neoplasm of upper-outer quadrant of right female breast: Secondary | ICD-10-CM | POA: Diagnosis not present

## 2020-02-04 DIAGNOSIS — C7949 Secondary malignant neoplasm of other parts of nervous system: Secondary | ICD-10-CM | POA: Diagnosis not present

## 2020-02-04 DIAGNOSIS — D72819 Decreased white blood cell count, unspecified: Secondary | ICD-10-CM | POA: Insufficient documentation

## 2020-02-04 DIAGNOSIS — Z79899 Other long term (current) drug therapy: Secondary | ICD-10-CM | POA: Diagnosis not present

## 2020-02-04 NOTE — Progress Notes (Signed)
[place] Work  [employer] fund given today. CSW unable to meet with patient due to schedule conflict. Available as needed for further resources.   Edwinna Areola Juelz Whittenberg, LCSW

## 2020-02-05 ENCOUNTER — Telehealth: Payer: Self-pay | Admitting: Licensed Clinical Social Worker

## 2020-02-05 ENCOUNTER — Other Ambulatory Visit: Payer: Self-pay

## 2020-02-05 ENCOUNTER — Ambulatory Visit
Admission: RE | Admit: 2020-02-05 | Discharge: 2020-02-05 | Disposition: A | Discharge status: Home / Self Care | Payer: Medicare PPO | Source: Ambulatory Visit | Attending: Radiation Oncology | Admitting: Radiation Oncology

## 2020-02-05 DIAGNOSIS — Z510 Encounter for antineoplastic radiation therapy: Secondary | ICD-10-CM | POA: Diagnosis not present

## 2020-02-05 NOTE — Telephone Encounter (Signed)
LVM for patient checking in regarding lodging and resources. Gave direct callback number.   Lorna Strother E, LCSW

## 2020-02-08 ENCOUNTER — Other Ambulatory Visit: Payer: Self-pay

## 2020-02-08 ENCOUNTER — Ambulatory Visit
Admission: RE | Admit: 2020-02-08 | Discharge: 2020-02-08 | Disposition: A | Discharge status: Home / Self Care | Payer: Medicare PPO | Source: Ambulatory Visit | Attending: Radiation Oncology | Admitting: Radiation Oncology

## 2020-02-08 DIAGNOSIS — Z510 Encounter for antineoplastic radiation therapy: Secondary | ICD-10-CM | POA: Diagnosis not present

## 2020-02-09 ENCOUNTER — Ambulatory Visit
Admission: RE | Admit: 2020-02-09 | Discharge: 2020-02-09 | Disposition: A | Discharge status: Home / Self Care | Payer: Medicare PPO | Source: Ambulatory Visit | Attending: Radiation Oncology | Admitting: Radiation Oncology

## 2020-02-09 ENCOUNTER — Other Ambulatory Visit: Payer: Self-pay

## 2020-02-09 DIAGNOSIS — Z510 Encounter for antineoplastic radiation therapy: Secondary | ICD-10-CM | POA: Diagnosis not present

## 2020-02-10 ENCOUNTER — Other Ambulatory Visit: Payer: Self-pay

## 2020-02-10 ENCOUNTER — Ambulatory Visit
Admission: RE | Admit: 2020-02-10 | Discharge: 2020-02-10 | Disposition: A | Discharge status: Home / Self Care | Payer: Medicare PPO | Source: Ambulatory Visit | Attending: Radiation Oncology | Admitting: Radiation Oncology

## 2020-02-10 ENCOUNTER — Other Ambulatory Visit: Payer: Self-pay | Admitting: Radiation Oncology

## 2020-02-10 DIAGNOSIS — Z510 Encounter for antineoplastic radiation therapy: Secondary | ICD-10-CM | POA: Diagnosis not present

## 2020-02-10 MED ORDER — SUCRALFATE 1 G PO TABS: 1.0000 g | ORAL_TABLET | Freq: Three times a day (TID) | ORAL | 1 refills | Status: DC

## 2020-02-11 ENCOUNTER — Other Ambulatory Visit: Payer: Self-pay

## 2020-02-11 ENCOUNTER — Ambulatory Visit
Admission: RE | Admit: 2020-02-11 | Discharge: 2020-02-11 | Disposition: A | Discharge status: Home / Self Care | Payer: Medicare PPO | Source: Ambulatory Visit | Attending: Radiation Oncology | Admitting: Radiation Oncology

## 2020-02-11 DIAGNOSIS — Z510 Encounter for antineoplastic radiation therapy: Secondary | ICD-10-CM | POA: Diagnosis not present

## 2020-02-12 ENCOUNTER — Other Ambulatory Visit: Payer: Self-pay

## 2020-02-12 ENCOUNTER — Ambulatory Visit: Payer: Medicare PPO

## 2020-02-12 ENCOUNTER — Ambulatory Visit
Admission: RE | Admit: 2020-02-12 | Discharge: 2020-02-12 | Disposition: A | Discharge status: Home / Self Care | Payer: Medicare PPO | Source: Ambulatory Visit | Attending: Radiation Oncology | Admitting: Radiation Oncology

## 2020-02-12 DIAGNOSIS — Z510 Encounter for antineoplastic radiation therapy: Secondary | ICD-10-CM | POA: Diagnosis not present

## 2020-02-12 NOTE — Progress Notes (Signed)
Received patient in the clinic today following radiation treatment. Patient excited to complete radiation therapy on Monday. While Butch Penny, RN changed the patient's chest wall dressing I listened as she expressed the following concern.   She reports painful swallowing. She goes onto explain she just began using carafate last night but doesn't seem to be getting much relief. Patient verbalized correct use of Carafate but described attempting to eat foods such as bread and meats. Encouraged patient to plan small frequent meals containing soft foods and liquids. Encouraged patient to avoid very hot or very cold liquids. Encouraged patient to avoid acidic liquids and foods. Encouraged patient to continue Carafate over the weekend but instead take it 15 minutes before meals. Explained the carafate most likely will not take all the discomfort away from swallowing but should certainly ease it.   Provided patient with two BOOST SOOTHE supplements to try. Patient verbalizes that she is lactose intolerant. I explained that unfortunately we do not stock any supplements that do not contain milk ingredients but that the soothe had less in comparison.   Patient verbalized understanding of all reviewed. Patient understands she will see Dr. Sondra Come on Monday following her final treatment. Patient understands this RN will inform Dr. Sondra Come of her continued difficulty swallowing despite carafate.

## 2020-02-15 ENCOUNTER — Ambulatory Visit
Admission: RE | Admit: 2020-02-15 | Discharge: 2020-02-15 | Disposition: A | Discharge status: Home / Self Care | Payer: Medicare PPO | Source: Ambulatory Visit | Attending: Radiation Oncology | Admitting: Radiation Oncology

## 2020-02-15 ENCOUNTER — Other Ambulatory Visit: Payer: Self-pay | Admitting: Registered Nurse

## 2020-02-15 ENCOUNTER — Encounter: Payer: Self-pay | Admitting: Physical Medicine and Rehabilitation

## 2020-02-15 ENCOUNTER — Encounter: Payer: Medicare PPO | Attending: Physical Medicine and Rehabilitation | Admitting: Physical Medicine and Rehabilitation

## 2020-02-15 ENCOUNTER — Other Ambulatory Visit: Payer: Self-pay

## 2020-02-15 VITALS — BP 134/89 | HR 112 | Temp 97.7°F | Ht 65.0 in | Wt 115.0 lb

## 2020-02-15 DIAGNOSIS — Z515 Encounter for palliative care: Secondary | ICD-10-CM | POA: Diagnosis not present

## 2020-02-15 DIAGNOSIS — Z510 Encounter for antineoplastic radiation therapy: Secondary | ICD-10-CM | POA: Diagnosis not present

## 2020-02-15 DIAGNOSIS — Z9889 Other specified postprocedural states: Secondary | ICD-10-CM | POA: Insufficient documentation

## 2020-02-15 DIAGNOSIS — R29898 Other symptoms and signs involving the musculoskeletal system: Secondary | ICD-10-CM | POA: Diagnosis present

## 2020-02-15 DIAGNOSIS — G8222 Paraplegia, incomplete: Secondary | ICD-10-CM | POA: Diagnosis present

## 2020-02-15 DIAGNOSIS — C50919 Malignant neoplasm of unspecified site of unspecified female breast: Secondary | ICD-10-CM | POA: Insufficient documentation

## 2020-02-15 MED ORDER — OXYCODONE HCL 10 MG PO TABS: 10.0000 mg | ORAL_TABLET | ORAL | 0 refills | Status: DC | PRN

## 2020-02-15 NOTE — Progress Notes (Signed)
Subjective:    Patient ID: [facility], female    DOB: [birth year], 49 y.o.   MRN: [id number]  HPI   Last she weighed 118-  Problems swallowing- so weight down to 116 lbs Just tolerating soft foods- sweet potatoes, applesauce,  Got a few Boost to try. Tries without dairy.  Likes dairy free- dairy intolerant.  No bread or meats-   Has a lot of pain at night- just goes to sleep a to try and avoid pain.   Has a lot of Valium left- uses if absolutely necessary.   Doesn't have any oxycodone left. Asking for pain meds esp to get through therapy.    Starts PT tomorrow - outpatient- [facility].   Mentioning something called [facility]- can come to home and change breast mass lesions/dressings.   Incisions on back feels irritated.   Fell "softly" yesterday while using RW- landed on hands and butt- legs weaker since radiation.    Spasms are seldom.   Pain Inventory Average Pain 4 Pain Right Now 4 My pain is aching  In the last 24 hours, has pain interfered with the following? General activity 4 Relation with others 0 Enjoyment of life 0 What TIME of day is your pain at its worst? night Sleep (in general) Fair  Pain is worse with: bending, standing and some activites Pain improves with: rest and medication Relief from Meds: 10  Mobility walk with assistance use a walker ability to climb steps?  no do you drive?  no Do you have any goals in this area?  yes  Function disabled: date disabled . I need assistance with the following:  bathing, meal prep, household duties and shopping  Neuro/Psych weakness numbness tingling trouble walking spasms  Prior Studies hospital f/u  Physicians involved in your care hospital f/u   Family History  Problem Relation Age of Onset  . Cancer Maternal Aunt        breast  . Stroke Maternal Aunt   . Heart attack Maternal Aunt   . Cancer Maternal Grandfather   . Colon cancer Maternal Grandfather   . Stroke Maternal  Grandfather   . Cancer Paternal Grandfather   . Colon cancer Paternal Grandfather   . Stroke Mother   . Heart attack Mother   . Diabetes Mother   . Hyperlipidemia Mother   . Heart disease Mother   . Diabetes Father   . Stroke Father   . Stroke Paternal Grandmother   . Heart attack Paternal Grandmother   . Heart failure Paternal Grandmother   . Diabetes Paternal Grandmother   . Hyperlipidemia Paternal Grandmother   . CAD Paternal Grandmother   . Stroke Maternal Grandmother   . Breast cancer Maternal Aunt    Social History   Socioeconomic History  . Marital status: Divorced    Spouse name: Not on file  . Number of children: Not on file  . Years of education: 50  . Highest education level: Not on file  Occupational History  . Not on file  Tobacco Use  . Smoking status: Never Smoker  . Smokeless tobacco: Never Used  Substance and Sexual Activity  . Alcohol use: No  . Drug use: No  . Sexual activity: Yes    Partners: Male    Birth control/protection: Surgical, Condom  Other Topics Concern  . Not on file  Social History Narrative   Lives at home with her grandmother   Right handed   Caffeine seldom, coffee once or twice  monthly   Social Determinants of Health   Financial Resource Strain:   . Difficulty of Paying Living Expenses:   Food Insecurity:   . Worried About Charity fundraiser in the Last Year:   . Arboriculturist in the Last Year:   Transportation Needs:   . Film/video editor (Medical):   Marland Kitchen Lack of Transportation (Non-Medical):   Physical Activity:   . Days of Exercise per Week:   . Minutes of Exercise per Session:   Stress:   . Feeling of Stress :   Social Connections:   . Frequency of Communication with Friends and Family:   . Frequency of Social Gatherings with Friends and Family:   . Attends Religious Services:   . Active Member of Clubs or Organizations:   . Attends Archivist Meetings:   Marland Kitchen Marital Status:    Past Surgical  History:  Procedure Laterality Date  . ABDOMINAL HYSTERECTOMY    . BREAST SURGERY    . LAMINECTOMY N/A 01/04/2020   Procedure: Thoracic Eight, Thoracic Nine THORACIC LAMINECTOMY FOR TUMOR with Thoracic Seven-Thoracic Ten instrumentation;  Surgeon: Misty Larsson, MD;  Location: [name];  Service: Neurosurgery;  Laterality: N/A;  Thoracic Eight, Thoracic Nine THORACIC LAMINECTOMY FOR TUMOR with Thoracic Seven-Thoracic Ten instrumentation  . MASTECTOMY    . salivary gland stone     Past Medical History:  Diagnosis Date  . Abnormal uterine bleeding   . Anemia   . Breast cancer ([place])   . Depression   . Fibroid   . Hormone disorder   . Hypertension   . Infertility, female    BP 134/89   Pulse (!) 112   Temp 97.7 F (36.5 C)   Ht 5\' 5"  (1.651 m)   Wt 115 lb (52.2 kg)   SpO2 98%   BMI 19.14 kg/m   Opioid Risk Score:   Fall Risk Score:  `1  Depression screen PHQ 2/9  Depression screen PHQ 2/9 02/15/2020  Down, Depressed, Hopeless 2  PHQ - 2 Score 2  Altered sleeping 3  Tired, decreased energy 3  Change in appetite 3  Feeling bad or failure about yourself  1  Trouble concentrating 1  Moving slowly or fidgety/restless 1  Suicidal thoughts 0  PHQ-9 Score 14  Difficult doing work/chores Somewhat difficult    Review of Systems  Constitutional: Positive for unexpected weight change.  HENT: Negative.   Eyes: Negative.   Respiratory: Negative.   Cardiovascular: Negative.   Gastrointestinal: Negative.   Endocrine: Negative.   Genitourinary: Negative.   Musculoskeletal:       Spasms   Skin: Negative.   Neurological: Positive for weakness and numbness.       Tingling   Hematological: Negative.   Psychiatric/Behavioral: Negative.   All other systems reviewed and are negative.      Objective:   Physical Exam Awake, alert, appropriate, in manual w/c. 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    1. Suggest smoothies with bananas, carnation instant breakfast pouches, yogurt esp greek yogurt  2. Also kids pouches- mix veggies and fruits.   3 . Steam whatever foods and then can stick - in mixer if need  be   4. Oxycodone 10 mg q3 hours as needed #180- is cancer patient.   5. Happy to do any documentation and orders relating to [facility] which can do wound care/dressings for her in spite of getting outpt PT. Pt to get them to call me and I will order as required.   6. Vit E oil- or Vit E lotion would be effective/helpful- for inciison.   7. Valium for muscle spasms as needed  8. F/U in 6-8 weeks- web or in person.  Spent a total of 35 minutes on appointment. As detailed above.

## 2020-02-15 NOTE — Patient Instructions (Signed)
Patient is a 49 yr old female with incomplete paraplegia due ot mets from breast cancer s/p radiation and surgery/fusion.    1. Suggest smoothies with bananas, carnation instant breakfast pouches, yogurt esp greek yogurt  2. Also kids pouches- mix veggies and fruits.   3 . Steam whatever foods and then can stick - in mixer if need be   4. Oxycodone 10 mg q3 hours as needed #180- is cancer patient.   5. Happy to do any documentation and orders relating to [facility] which can do wound care/dressings for her in spite of getting outpt PT. Pt to get them to call me and I will order as required.   6. Vit E oil- or Vit E lotion would be effective/helpful- for inciison.   7. Valium for muscle spasms as needed  8. F/U in 6-8 weeks- web or in person.

## 2020-02-15 NOTE — Telephone Encounter (Signed)
Dr. Dagoberto Ligas is seeing this patient today.  Rerouted to Dr. Dagoberto Ligas

## 2020-02-17 ENCOUNTER — Other Ambulatory Visit: Payer: Self-pay | Admitting: *Deleted

## 2020-02-17 DIAGNOSIS — C50411 Malignant neoplasm of upper-outer quadrant of right female breast: Secondary | ICD-10-CM

## 2020-02-17 DIAGNOSIS — Z170 Estrogen receptor positive status [ER+]: Secondary | ICD-10-CM

## 2020-02-17 NOTE — Telephone Encounter (Signed)
Dr. Dagoberto Ligas, order the medications for Ms. Cowgill.

## 2020-02-18 ENCOUNTER — Inpatient Hospital Stay: Payer: Medicare PPO

## 2020-02-18 ENCOUNTER — Inpatient Hospital Stay: Payer: Medicare PPO | Admitting: Hematology and Oncology

## 2020-02-24 ENCOUNTER — Telehealth: Payer: Self-pay

## 2020-02-24 NOTE — Telephone Encounter (Signed)
Vitamin D was taking 10,000 in the hospital and was working now the given by pharmacy VItamin OTC and not strong enough. Would like to discuss wound care with you.

## 2020-02-25 ENCOUNTER — Telehealth: Payer: Self-pay

## 2020-02-25 NOTE — Telephone Encounter (Signed)
L/M - at 11:49 am- no answer on phone- said will call her back later.

## 2020-02-26 ENCOUNTER — Telehealth: Payer: Self-pay | Admitting: *Deleted

## 2020-02-26 NOTE — Telephone Encounter (Signed)
Duplicate

## 2020-02-26 NOTE — Telephone Encounter (Signed)
Attempted to get ahold of pt again today at 9:32 am on 02/26/20- no answer- l/m on voicemail.

## 2020-02-26 NOTE — Telephone Encounter (Signed)
Patient called back.  She says she will be available after 2:00pm.

## 2020-02-26 NOTE — Telephone Encounter (Signed)
Pt reports having bone pain- esp in hips- thinks due ot Vit D level being too low. Explained would speak to Oncology about this before we just increase Vit D.   Also pain 7/10 currently but only take oxy at night due to constipation- explained can take valium 1/2 to 1 tab as needed for muscle tightness/spasms/muscle soreness if need be- shouldn't cause constipation.   Having BMs QOD- sounds like needs to increase Miralax for abd distension to 1 1/2 doses/day.  She voiced understanding.   Plans to see Oncology next week.  Wound care made her miss therapy so plans on doing it herself and having Oncology give her supplies.  Will call me if/when she needs refills.

## 2020-03-02 NOTE — Progress Notes (Signed)
Overall great  Patient Care Team: Patient, [name] as PCP - General (General Practice)  DIAGNOSIS:    ICD-10-CM   1. Metastatic breast cancer ([place])  C50.919   2. 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 Malignant neoplasm of upper-outer quadrant of right breast in female, estrogen receptor positive ([place])  C50.411    Z17.0     SUMMARY OF ONCOLOGIC HISTORY: Oncology History  Breast cancer of upper-outer quadrant of right female breast ([place])  09/21/2010 Mammogram   right breast linear and segmental pleomorphic calcifications from 4:00 to 6:00 position ultrasound revealed 1.6 cm and 1.1 cm masses   09/27/2010 Initial Biopsy   ultrasound-guided biopsy of all masses showed DCIS grade 2; patient went to cancer treatment centers of [place] for second opinion and delayed therapy   01/26/2011 Surgery   right mastectomy followed by reconstruction: 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 But she could not afford these trips.   01/08/2014 Breast MRI   right breast multiple enhancing masses within the soft tissues largest 5.6 cm in wall skin surface and the capsule of the silicone prosthesis, contiguous nodules involving in inferomedial breast and tired and subcentimeter nodules across the midline   08/29/2015 Surgery   Right mastectomy: IDC with invol of skin and skin ulceration, breast capsule inv cancer, superior medial margin positive, 1/1 LN positive, grade 3, 14.3 cm, 3.5 cm, ALI, chest wall involv, ER 90-100%, PR 80-90%, HER-2 negative, Ki 67  40-50% T4CN1 (St 3B)   03/05/2016 -  Anti-estrogen oral therapy   Tamoxifen 20 mg daily stopped due to headache and uncontrolled hypertension. Decrease to 10 mg daily 04/03/2016, stopped June 2017 and took an estrogen metabolizer over-the-counter; started Aromasin April 2018 from a [place] physician, Zoladex started on 05/2017 and switched to Letrozole in 09/2017   01/04/2020 Surgery   Patient presented to the ED on 01/04/20 for worsening lower extremity numbness and underwent a decompressive laminectomy with tumor resection at T9 that was complicated with acute blood loss anemia and leukocytosis.   01/28/2020 - 02/15/2020 Radiation Therapy   Palliative radiation at site of thoracic tumor resection     CHIEF COMPLIANT: Follow-up of metastatic breast cancer  INTERVAL HISTORY: [facility] is a 49 y.o. with above-mentioned history of metastatic breast cancerwhois currently on Ibrance with Zoladex and letrozole. She presented to the ED on 01/04/20 for worsening lower extremity numbness and underwent a decompressive laminectomy with tumor resection at T9 that was complicated with acute blood loss anemia and leukocytosis. This was followed by palliative radiation. She presentsto the clinic today for follow-up.  ALLERGIES:  is allergic to amoxicillin; penicillins; pork-derived products; and tamoxifen.  MEDICATIONS:  Current Outpatient Medications  Medication Sig Dispense Refill  . acetaminophen (TYLENOL) 325 MG tablet Take 2 tablets (650 mg total) by mouth every 4 (four) hours as needed for mild pain ((score 1 to 3) or temp > 100.5). 30 tablet 0  . amLODipine (NORVASC) 10 MG tablet TAKE 1 TABLET BY MOUTH EVERY DAY 30 tablet 0  . Ascorbic Acid (VITAMIN C PO) Take by  mouth.    . Bioflavonoid Products (ESTER C PO) Take 1 tablet by mouth with breakfast, with lunch, and with evening meal.     . bisacodyl (DULCOLAX) 10 MG suppository Place 1 suppository (10 mg total) rectally daily as needed for moderate  constipation. 12 suppository 0  . BLACK COHOSH EXTRACT PO Take 1 tablet by mouth in the morning, at noon, and at bedtime.    Marland Kitchen BLACK CURRANT SEED OIL PO Take by mouth.    . calcium carbonate (OS-CAL - DOSED IN MG OF ELEMENTAL CALCIUM) 1250 (500 Ca) MG tablet Take 1 tablet (500 mg of elemental calcium total) by mouth 2 (two) times daily with a meal.    . [employer] D3 125 MCG (5000 UT) capsule TAKE 1 CAPSULE BY MOUTH TWICE A DAY 60 capsule 5  . diazepam (VALIUM) 5 MG tablet Take 1-2 tablets (5-10 mg total) by mouth every 6 (six) hours as needed for muscle spasms. 30 tablet 0  . Flaxseed Oil OIL Take 5 mLs by mouth in the morning, at noon, and at bedtime.     Marland Kitchen goserelin (ZOLADEX) 3.6 MG injection Inject 3.6 mg into the skin every 28 (twenty-eight) days.    Marland Kitchen letrozole (FEMARA) 2.5 MG tablet Take 1 tablet (2.5 mg total) by mouth daily. 90 tablet 1  . Oxycodone HCl 10 MG TABS Take 1 tablet (10 mg total) by mouth every 3 (three) hours as needed ((score 7 to 10)). 180 tablet 0  . palbociclib (IBRANCE) 125 MG tablet Take 1 tablet (125 mg total) by mouth daily. Take for 21 days on, 7 days off, repeat every 28 days. 21 tablet 2  . polyethylene glycol (MIRALAX / GLYCOLAX) 17 g packet Take 17 g by mouth daily as needed for mild constipation. 14 each 0  . sucralfate (CARAFATE) 1 g tablet Take 1 tablet (1 g total) by mouth 4 (four) times daily -  with meals and at bedtime. Crush and dissolve in 10 mL of warm water prior to swallowing 120 tablet 1   No current facility-administered medications for this visit.    PHYSICAL EXAMINATION: ECOG PERFORMANCE STATUS: 2 - Symptomatic, <50% confined to bed  Vitals:   03/03/20 1449  BP: (!) 128/97  Pulse: (!) 104  Resp: 18  Temp: 97.7 F (36.5 C)  SpO2: 100%   Filed Weights   03/03/20 1449  Weight: 118 lb 14.4 oz (53.9 kg)    LABORATORY DATA:  I have reviewed the data as listed CMP Latest Ref Rng & Units 03/03/2020 01/28/2020 01/18/2020  Glucose 70 - 99 mg/dL 99  215(H) 153(H)  BUN 6 - 20 mg/dL 9 18 19   Creatinine 0.44 - 1.00 mg/dL 0.73 0.68 0.55  Sodium 135 - 145 mmol/L 143 142 139  Potassium 3.5 - 5.1 mmol/L 3.6 3.5 4.5  Chloride 98 - 111 mmol/L 104 105 101  CO2 22 - 32 mmol/L 30 24 28   Calcium 8.9 - 10.3 mg/dL 10.0 9.0 8.5(L)  Total Protein 6.5 - 8.1 g/dL 7.6 6.3(L) 4.7(L)  Total Bilirubin 0.3 - 1.2 mg/dL 0.2(L) 0.2(L) 0.3  Alkaline Phos 38 - 126 U/L 110 112 67  AST 15 - 41 U/L 20 19 26   ALT 0 - 44 U/L 16 45(H) 63(H)    Lab Results  Component Value Date   WBC 6.1 03/03/2020   HGB 12.3 03/03/2020   HCT 38.1 03/03/2020   MCV 93.6 03/03/2020   PLT 534 (H) 03/03/2020   NEUTROABS 5.1 03/03/2020  ASSESSMENT & PLAN:  Breast cancer of upper-outer quadrant of right female breast ([place]) Recurrent right breast cancer initially DCIS treated with mastectomy followed by reconstruction and later developed chest wall recurrence in 2013 ER/PR positive HER-2 negative, patient underwent alternative therapies in [place] but could not afford the trips and hence she has not been on any breast cancer therapy for a long time.  Treatment summary: 1. Right mastectomy 08/29/2015 at [facility] (Dr.Singh): IDC with invol of skin and skin ulceration, breast capsule inv cancer, superior medial margin positive, 1/1 LN positive, grade 3, 14.3 cm, 3.5 cm, ALI, chest wall involv, ER 90-100%, PR 80-90%, HER-2 negative, Ki 67 40-50% T4CN1 (St 3B) 2. Patient started tamoxifen but developed severe headache with severe hypertension and tamoxifen was discontinued 03/17/2016. 3. 02/2017 started on Exemestane, Zoladex started 05/29/2017 4.  Hospitalization for paraplegia: T9 vertebral body compression status post decompressive laminectomy and tumor resection 5.  Palliative radiation to the thoracic spine ----------------------------------------------------------------------------------------------------------------- Treatment plan: Ibrance with exemestane and Zoladex Wound care  issues with the fungating tumor: Our nursing staff is assisting her with dressing changes and supplies. Unfortunately home health is unable to assist her.  Return to clinic in 2 weeks for labs and follow-up.  No orders of the defined types were placed in this encounter.  The patient has a good understanding of the overall plan. she agrees with it. she will call with any problems that may develop before the next visit here.  Total time spent: 30 mins including face to face time and time spent for planning, charting and coordination of care  Nicholas Lose, MD 03/03/2020  I, Cloyde Reams Dorshimer, am acting as scribe for Dr. Nicholas Lose.  I have reviewed the above documentation for accuracy and completeness, and I agree with the above.

## 2020-03-03 ENCOUNTER — Inpatient Hospital Stay: Payer: Medicare PPO

## 2020-03-03 ENCOUNTER — Other Ambulatory Visit: Payer: Self-pay

## 2020-03-03 ENCOUNTER — Inpatient Hospital Stay: Payer: Medicare PPO | Attending: Hematology and Oncology

## 2020-03-03 ENCOUNTER — Inpatient Hospital Stay (HOSPITAL_BASED_OUTPATIENT_CLINIC_OR_DEPARTMENT_OTHER): Payer: Medicare PPO | Admitting: Hematology and Oncology

## 2020-03-03 VITALS — BP 128/97 | HR 104 | Temp 97.7°F | Resp 18 | Ht 65.0 in | Wt 118.9 lb

## 2020-03-03 DIAGNOSIS — C7951 Secondary malignant neoplasm of bone: Secondary | ICD-10-CM | POA: Diagnosis not present

## 2020-03-03 DIAGNOSIS — C50411 Malignant neoplasm of upper-outer quadrant of right female breast: Secondary | ICD-10-CM

## 2020-03-03 DIAGNOSIS — Z170 Estrogen receptor positive status [ER+]: Secondary | ICD-10-CM | POA: Diagnosis not present

## 2020-03-03 DIAGNOSIS — C7949 Secondary malignant neoplasm of other parts of nervous system: Secondary | ICD-10-CM | POA: Diagnosis not present

## 2020-03-03 DIAGNOSIS — Z9011 Acquired absence of right breast and nipple: Secondary | ICD-10-CM | POA: Diagnosis not present

## 2020-03-03 DIAGNOSIS — C50919 Malignant neoplasm of unspecified site of unspecified female breast: Secondary | ICD-10-CM | POA: Diagnosis not present

## 2020-03-03 DIAGNOSIS — Z79899 Other long term (current) drug therapy: Secondary | ICD-10-CM | POA: Diagnosis not present

## 2020-03-03 DIAGNOSIS — G8220 Paraplegia, unspecified: Secondary | ICD-10-CM | POA: Diagnosis not present

## 2020-03-03 LAB — CMP (CANCER CENTER ONLY)
ALT: 16 U/L (ref 0–44)
AST: 20 U/L (ref 15–41)
Albumin: 3.6 g/dL (ref 3.5–5.0)
Alkaline Phosphatase: 110 U/L (ref 38–126)
Anion gap: 9 (ref 5–15)
BUN: 9 mg/dL (ref 6–20)
CO2: 30 mmol/L (ref 22–32)
Calcium: 10 mg/dL (ref 8.9–10.3)
Chloride: 104 mmol/L (ref 98–111)
Creatinine: 0.73 mg/dL (ref 0.44–1.00)
GFR, Est AFR Am: >60 mL/min (ref >60)
GFR, Estimated: >60 mL/min (ref >60)
Glucose, Bld: 99 mg/dL (ref 70–99)
Potassium: 3.6 mmol/L (ref 3.5–5.1)
Sodium: 143 mmol/L (ref 135–145)
Total Bilirubin: 0.2 mg/dL — ABNORMAL LOW (ref 0.3–1.2)
Total Protein: 7.6 g/dL (ref 6.5–8.1)

## 2020-03-03 LAB — CBC WITH DIFFERENTIAL (CANCER CENTER ONLY)
Abs Immature Granulocytes: 0.03 10*3/uL (ref 0.00–0.07)
Basophils Absolute: 0 10*3/uL (ref 0.0–0.1)
Basophils Relative: 0 %
Eosinophils Absolute: 0 10*3/uL (ref 0.0–0.5)
Eosinophils Relative: 0 %
HCT: 38.1 % (ref 36.0–46.0)
Hemoglobin: 12.3 g/dL (ref 12.0–15.0)
Immature Granulocytes: 1 %
Lymphocytes Relative: 9 %
Lymphs Abs: 0.5 10*3/uL — ABNORMAL LOW (ref 0.7–4.0)
MCH: 30.2 pg (ref 26.0–34.0)
MCHC: 32.3 g/dL (ref 30.0–36.0)
MCV: 93.6 fL (ref 80.0–100.0)
Monocytes Absolute: 0.5 10*3/uL (ref 0.1–1.0)
Monocytes Relative: 8 %
Neutro Abs: 5.1 10*3/uL (ref 1.7–7.7)
Neutrophils Relative %: 82 %
Platelet Count: 534 10*3/uL — ABNORMAL HIGH (ref 150–400)
RBC: 4.07 MIL/uL (ref 3.87–5.11)
RDW: 13 % (ref 11.5–15.5)
WBC Count: 6.1 10*3/uL (ref 4.0–10.5)
nRBC: 0 % (ref 0.0–0.2)

## 2020-03-03 MED ORDER — GOSERELIN ACETATE 3.6 MG ~~LOC~~ IMPL
DRUG_IMPLANT | SUBCUTANEOUS | Status: AC
  Filled 2020-03-03: qty 3.6

## 2020-03-03 MED ORDER — GOSERELIN ACETATE 3.6 MG ~~LOC~~ IMPL
3.6000 mg | DRUG_IMPLANT | Freq: Once | SUBCUTANEOUS | Status: AC
  Administered 2020-03-03: 3.6 mg via SUBCUTANEOUS

## 2020-03-03 MED ORDER — CALCIUM CARBONATE 1250 (500 CA) MG PO TABS: 1.0000 | ORAL_TABLET | Freq: Two times a day (BID) | ORAL | Status: DC

## 2020-03-03 MED ORDER — PALBOCICLIB 125 MG PO TABS: 125.0000 mg | ORAL_TABLET | Freq: Every day | ORAL | 2 refills | Status: DC

## 2020-03-03 MED FILL — IBRANCE 125 MG TABS: 125 | 28 days supply | Qty: 21 | Fill #0

## 2020-03-03 NOTE — Patient Instructions (Signed)

## 2020-03-03 NOTE — Assessment & Plan Note (Signed)
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  Treatment summary: 1. Right mastectomy 08/29/2015 at [facility] (Dr.Singh): IDC with invol of skin and skin ulceration, breast capsule inv cancer, superior medial margin positive, 1/1 LN positive, grade 3, 14.3 cm, 3.5 cm, ALI, chest wall involv, ER 90-100%, PR 80-90%, HER-2 negative, Ki 67 40-50% T4CN1 (St 3B) 2. Patient started tamoxifen but developed severe headache with severe hypertension and tamoxifen was discontinued 03/17/2016. 3. 02/2017 started on Exemestane, Zoladex started 05/29/2017 4.  Hospitalization for paraplegia: T9 vertebral body compression status post decompressive laminectomy and tumor resection 5.  Palliative radiation to the thoracic spine ----------------------------------------------------------------------------------------------------------------- Treatment plan: Ibrance with exemestane and Zoladex Return to clinic in 2 weeks for labs and follow-up.

## 2020-03-10 ENCOUNTER — Telehealth: Payer: Self-pay

## 2020-03-10 NOTE — Telephone Encounter (Signed)
RN left voicemail for patient to return call.  Re: Wound Care.

## 2020-03-10 NOTE — Telephone Encounter (Signed)
Opened in error

## 2020-03-16 ENCOUNTER — Other Ambulatory Visit: Payer: Self-pay | Admitting: Physical Medicine and Rehabilitation

## 2020-03-21 ENCOUNTER — Telehealth: Payer: Self-pay | Admitting: Hematology and Oncology

## 2020-03-21 ENCOUNTER — Telehealth: Payer: Self-pay | Admitting: *Deleted

## 2020-03-21 NOTE — Telephone Encounter (Signed)
Received VM from pt.  Attempt x1 to return call.  No answer, LVM to return call to the office.  

## 2020-03-21 NOTE — Telephone Encounter (Signed)
Scheduled appt per 5/17 sch message - pt is aware of appt date and time .   

## 2020-03-29 ENCOUNTER — Inpatient Hospital Stay: Payer: Medicare PPO | Admitting: Hematology and Oncology

## 2020-03-29 ENCOUNTER — Inpatient Hospital Stay: Payer: Medicare PPO

## 2020-03-29 MED FILL — IBRANCE 125 MG TABS: 125 | 28 days supply | Qty: 21 | Fill #1

## 2020-03-29 NOTE — Assessment & Plan Note (Deleted)
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  Treatment summary: 1. Right mastectomy 08/29/2015 at [facility] (Dr.Singh): IDC with invol of skin and skin ulceration, breast capsule inv cancer, superior medial margin positive, 1/1 LN positive, grade 3, 14.3 cm, 3.5 cm, ALI, chest wall involv, ER 90-100%, PR 80-90%, HER-2 negative, Ki 67 40-50% T4CN1 (St 3B) 2. Patient started tamoxifen but developed severe headache with severe hypertension and tamoxifen was discontinued 03/17/2016. 3. 02/2017 started on Exemestane, Zoladex started 05/29/2017 4.  Hospitalization for paraplegia: T9 vertebral body compression status post decompressive laminectomy and tumor resection 01/04/2020 5.  Palliative radiation to the thoracic spine 01/28/2020 ----------------------------------------------------------------------------------------------------------------- Treatment plan: Ibrance with exemestane and Zoladex started 03/03/2020 Wound care issues with the fungating tumor:  Ibrance toxicities:  Return to clinic in 1 month with labs and follow-up

## 2020-03-30 NOTE — Progress Notes (Signed)
 Patient Care Team: Patient, [name] as PCP - General (General Practice)  DIAGNOSIS:    ICD-10-CM   1. 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 Metastatic breast cancer (HCC)  C50.919   3. Malignant neoplasm of upper-outer quadrant of right breast in female, estrogen receptor positive (HCC)  C50.411 SCHEDULING COMMUNICATION INJECTION   Z17.0 Treatment conditions    goserelin (ZOLADEX) injection 3.6 mg    Treatment conditions    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; [facility] [place] [facility] [place] [facility] [place] and delayed therapy   01/26/2011 Surgery   right mastectomy followed by reconstruction: Invasive ductal carcinoma T1 N1 MIC M0 stage IB ER/PR positive HER-2 negative, BRCA negative, Oncotype DX low risk   05/29/2012 Procedure   right chest wall nodule excision done on 06/24/2012 showed metastatic carcinoma margins were positive, but CT scan no metastatic disease, recommended chemotherapy but patient refused also refused reexcision   04/28/2013 Treatment Plan Change   patient went to [place] for alternative treatments and took herbal medications, tonics etc. But she could not afford these trips.   01/08/2014 Breast MRI   right breast multiple enhancing masses within the soft tissues largest 5.6 cm in wall skin surface and the capsule of the silicone prosthesis, contiguous nodules involving in inferomedial breast and tired and subcentimeter nodules across the midline   08/29/2015 Surgery   Right mastectomy: IDC with invol of skin and skin ulceration, breast capsule inv cancer, superior medial margin positive, 1/1 LN  positive, grade 3, 14.3 cm, 3.5 cm, ALI, chest wall involv, ER 90-100%, PR 80-90%, HER-2 negative, Ki 67 40-50% T4CN1 (St 3B)   03/05/2016 -  Anti-estrogen oral therapy   Tamoxifen 20 mg daily stopped due to headache and uncontrolled hypertension. Decrease to 10 mg daily 04/03/2016, stopped June 2017 and took an estrogen metabolizer over-the-counter; started Aromasin April 2018 from a [place] physician, Zoladex started on 05/2017 and switched to Letrozole in 09/2017   01/04/2020 Surgery   Patient presented to the ED on 01/04/20 for worsening lower extremity numbness and underwent a decompressive laminectomy with tumor resection at T9 that was complicated with acute blood loss anemia and leukocytosis.   01/28/2020 - 02/15/2020 Radiation Therapy   Palliative radiation at site of thoracic tumor resection   03/03/2020 Miscellaneous    Ibrance with exemestane and Zoladex     CHIEF COMPLIANT: Follow-up of metastatic breast cancer  INTERVAL HISTORY: Misty Arnold is a 48 y.o. with above-mentioned history of metastatic breast cancerwhois currently onIbrance withZoladexandletrozole.She presentsto the clinic today for follow-up.  She tells me that her lower extremity strength is improving slowly.  She is now able to walk at home with a walker.  She is planning to progress to a cane.  Exposed breast tissue it does not appear to be bleeding as much.  She is using wound care dressing supplies and taking care of it.  She tells me does not have any further odor.  ALLERGIES:  is allergic to amoxicillin; penicillins; pork-derived products; and tamoxifen.  MEDICATIONS:  Current Outpatient Medications  Medication Sig Dispense Refill  . amLODipine (NORVASC) 10 MG tablet TAKE 1   TABLET BY MOUTH EVERY DAY 30 tablet 1  . acetaminophen (TYLENOL) 325 MG tablet Take 2 tablets (650 mg total) by mouth every 4 (four) hours as needed for mild pain ((score 1 to 3) or temp > 100.5). 30 tablet 0  . Ascorbic Acid (VITAMIN C  PO) Take by mouth.    . Bioflavonoid Products (ESTER C PO) Take 1 tablet by mouth with breakfast, with lunch, and with evening meal.     . bisacodyl (DULCOLAX) 10 MG suppository Place 1 suppository (10 mg total) rectally daily as needed for moderate constipation. 12 suppository 0  . BLACK COHOSH EXTRACT PO Take 1 tablet by mouth in the morning, at noon, and at bedtime.    . BLACK CURRANT SEED OIL PO Take by mouth.    . calcium carbonate (OS-CAL - DOSED IN MG OF ELEMENTAL CALCIUM) 1250 (500 Ca) MG tablet Take 1 tablet (500 mg of elemental calcium total) by mouth 2 (two) times daily with a meal.    . CVS D3 125 MCG (5000 UT) capsule TAKE 1 CAPSULE BY MOUTH TWICE A DAY 60 capsule 5  . diazepam (VALIUM) 5 MG tablet Take 1-2 tablets (5-10 mg total) by mouth every 6 (six) hours as needed for muscle spasms. 30 tablet 0  . Flaxseed Oil OIL Take 5 mLs by mouth in the morning, at noon, and at bedtime.     . goserelin (ZOLADEX) 3.6 MG injection Inject 3.6 mg into the skin every 28 (twenty-eight) days.    . letrozole (FEMARA) 2.5 MG tablet Take 1 tablet (2.5 mg total) by mouth daily. 90 tablet 1  . Oxycodone HCl 10 MG TABS Take 1 tablet (10 mg total) by mouth every 3 (three) hours as needed ((score 7 to 10)). 180 tablet 0  . palbociclib (IBRANCE) 125 MG tablet Take 1 tablet (125 mg total) by mouth daily. Take for 21 days on, 7 days off, repeat every 28 days. 21 tablet 2  . polyethylene glycol (MIRALAX / GLYCOLAX) 17 g packet Take 17 g by mouth daily as needed for mild constipation. 14 each 0  . sucralfate (CARAFATE) 1 g tablet Take 1 tablet (1 g total) by mouth 4 (four) times daily -  with meals and at bedtime. Crush and dissolve in 10 mL of warm water prior to swallowing 120 tablet 1   No current facility-administered medications for this visit.    PHYSICAL EXAMINATION: ECOG PERFORMANCE STATUS: 2 - Symptomatic, <50% confined to bed  Vitals:   03/31/20 1548  BP: 130/74  Pulse: 100  Resp: 18  Temp:  98.2 F (36.8 C)  SpO2: 100%   Filed Weights   03/31/20 1548  Weight: 120 lb 11.2 oz (54.7 kg)    LABORATORY DATA:  I have reviewed the data as listed CMP Latest Ref Rng & Units 03/31/2020 03/03/2020 01/28/2020  Glucose 70 - 99 mg/dL 105(H) 99 215(H)  BUN 6 - 20 mg/dL 13 9 18  Creatinine 0.44 - 1.00 mg/dL 0.62 0.73 0.68  Sodium 135 - 145 mmol/L 145 143 142  Potassium 3.5 - 5.1 mmol/L 3.4(L) 3.6 3.5  Chloride 98 - 111 mmol/L 105 104 105  CO2 22 - 32 mmol/L 29 30 24  Calcium 8.9 - 10.3 mg/dL 9.8 10.0 9.0  Total Protein 6.5 - 8.1 g/dL 7.7 7.6 6.3(L)  Total Bilirubin 0.3 - 1.2 mg/dL <0.1(L) 0.2(L) 0.2(L)  Alkaline Phos 38 - 126 U/L 97 110 112  AST 15 - 41 U/L 36 20 19    ALT 0 - 44 U/L 48(H) 16 45(H)    Lab Results  Component Value Date   WBC 2.3 (L) 03/31/2020   HGB 12.0 03/31/2020   HCT 36.0 03/31/2020   MCV 92.1 03/31/2020   PLT 326 03/31/2020   NEUTROABS 1.4 (L) 03/31/2020    ASSESSMENT & PLAN:  Breast cancer of upper-outer quadrant of right female breast ([place]) Recurrent right breast cancer initially DCIS treated with mastectomy followed by reconstruction and later developed chest wall recurrence in 2013 ER/PR positive HER-2 negative, patient underwent alternative therapies in [place] but could not afford the trips and hence she has not been on any breast cancer therapy for a long time.  Treatment summary: 1. Right mastectomy 08/29/2015 at [facility] (Dr.Singh): IDC with invol of skin and skin ulceration, breast capsule inv cancer, superior medial margin positive, 1/1 LN positive, grade 3, 14.3 cm, 3.5 cm, ALI, chest wall involv, ER 90-100%, PR 80-90%, HER-2 negative, Ki 67 40-50% T4CN1 (St 3B) 2. Patient started tamoxifen but developed severe headache with severe hypertension and tamoxifen was discontinued 03/17/2016. 3. 02/2017 started on Exemestane, Zoladex started 05/29/2017 4.  Hospitalization for paraplegia: T9 vertebral body compression status post decompressive laminectomy  and tumor resection 01/04/2020 5.  Palliative radiation to the thoracic spine 01/28/2020 ----------------------------------------------------------------------------------------------------------------- Treatment plan: Ibrance with exemestane and Zoladex started 03/03/2020 Wound care issues with the fungating tumor:  Ibrance toxicities: ANC: 1.4: Continue with the same dosage. Overall she is tolerating it very well. Our plan is to obtain scans in 3 months.  Return to clinic in 1 month with labs, injection and follow-up    Orders Placed This Encounter  Procedures  . SCHEDULING COMMUNICATION INJECTION    1 hour injection apppointment  . Treatment conditions    Provider to add parameters.    Standing Status:   Standing    Number of Occurrences:   1    Order Specific Question:   Did you provide treatment parameters in the comments section?    Answer:   No   The patient has a good understanding of the overall plan. she agrees with it. she will call with any problems that may develop before the next visit here.  Total time spent: 30 mins including face to face time and time spent for planning, charting and coordination of care  Nicholas Lose, MD 03/31/2020  I, Cloyde Reams Dorshimer, am acting as scribe for Dr. Nicholas Lose.  I have reviewed the above documentation for accuracy and completeness, and I agree with the above.

## 2020-03-31 ENCOUNTER — Inpatient Hospital Stay (HOSPITAL_BASED_OUTPATIENT_CLINIC_OR_DEPARTMENT_OTHER): Payer: Medicare PPO | Admitting: Hematology and Oncology

## 2020-03-31 ENCOUNTER — Inpatient Hospital Stay: Payer: Medicare PPO | Attending: Hematology and Oncology

## 2020-03-31 ENCOUNTER — Inpatient Hospital Stay: Payer: Medicare PPO

## 2020-03-31 ENCOUNTER — Other Ambulatory Visit: Payer: Self-pay

## 2020-03-31 VITALS — BP 130/74 | HR 100 | Temp 98.2°F | Resp 18 | Ht 65.0 in | Wt 120.7 lb

## 2020-03-31 DIAGNOSIS — G8220 Paraplegia, unspecified: Secondary | ICD-10-CM | POA: Diagnosis not present

## 2020-03-31 DIAGNOSIS — Z9011 Acquired absence of right breast and nipple: Secondary | ICD-10-CM | POA: Diagnosis not present

## 2020-03-31 DIAGNOSIS — Z170 Estrogen receptor positive status [ER+]: Secondary | ICD-10-CM

## 2020-03-31 DIAGNOSIS — N6314 Unspecified lump in the right breast, lower inner quadrant: Secondary | ICD-10-CM | POA: Insufficient documentation

## 2020-03-31 DIAGNOSIS — C50919 Malignant neoplasm of unspecified site of unspecified female breast: Secondary | ICD-10-CM | POA: Diagnosis not present

## 2020-03-31 DIAGNOSIS — Z79811 Long term (current) use of aromatase inhibitors: Secondary | ICD-10-CM | POA: Diagnosis not present

## 2020-03-31 DIAGNOSIS — C7951 Secondary malignant neoplasm of bone: Secondary | ICD-10-CM | POA: Insufficient documentation

## 2020-03-31 DIAGNOSIS — C7949 Secondary malignant neoplasm of other parts of nervous system: Secondary | ICD-10-CM | POA: Diagnosis not present

## 2020-03-31 DIAGNOSIS — C50411 Malignant neoplasm of upper-outer quadrant of right female breast: Secondary | ICD-10-CM

## 2020-03-31 DIAGNOSIS — Z923 Personal history of irradiation: Secondary | ICD-10-CM | POA: Insufficient documentation

## 2020-03-31 LAB — CMP (CANCER CENTER ONLY)
ALT: 48 U/L — ABNORMAL HIGH (ref 0–44)
AST: 36 U/L (ref 15–41)
Albumin: 4.6 g/dL (ref 3.5–5.0)
Alkaline Phosphatase: 97 U/L (ref 38–126)
Anion gap: 11 (ref 5–15)
BUN: 13 mg/dL (ref 6–20)
CO2: 29 mmol/L (ref 22–32)
Calcium: 9.8 mg/dL (ref 8.9–10.3)
Chloride: 105 mmol/L (ref 98–111)
Creatinine: 0.62 mg/dL (ref 0.44–1.00)
GFR, Est AFR Am: >60 mL/min (ref >60)
GFR, Estimated: >60 mL/min (ref >60)
Glucose, Bld: 105 mg/dL — ABNORMAL HIGH (ref 70–99)
Potassium: 3.4 mmol/L — ABNORMAL LOW (ref 3.5–5.1)
Sodium: 145 mmol/L (ref 135–145)
Total Bilirubin: <0.1 mg/dL — ABNORMAL LOW (ref 0.3–1.2)
Total Protein: 7.7 g/dL (ref 6.5–8.1)

## 2020-03-31 LAB — CBC WITH DIFFERENTIAL (CANCER CENTER ONLY)
Abs Immature Granulocytes: 0 10*3/uL (ref 0.00–0.07)
Basophils Absolute: 0.1 10*3/uL (ref 0.0–0.1)
Basophils Relative: 2 %
Eosinophils Absolute: 0 10*3/uL (ref 0.0–0.5)
Eosinophils Relative: 0 %
HCT: 36 % (ref 36.0–46.0)
Hemoglobin: 12 g/dL (ref 12.0–15.0)
Immature Granulocytes: 0 %
Lymphocytes Relative: 28 %
Lymphs Abs: 0.7 10*3/uL (ref 0.7–4.0)
MCH: 30.7 pg (ref 26.0–34.0)
MCHC: 33.3 g/dL (ref 30.0–36.0)
MCV: 92.1 fL (ref 80.0–100.0)
Monocytes Absolute: 0.3 10*3/uL (ref 0.1–1.0)
Monocytes Relative: 11 %
Neutro Abs: 1.4 10*3/uL — ABNORMAL LOW (ref 1.7–7.7)
Neutrophils Relative %: 59 %
Platelet Count: 326 10*3/uL (ref 150–400)
RBC: 3.91 MIL/uL (ref 3.87–5.11)
RDW: 13.8 % (ref 11.5–15.5)
WBC Count: 2.3 10*3/uL — ABNORMAL LOW (ref 4.0–10.5)
nRBC: 0 % (ref 0.0–0.2)

## 2020-03-31 MED ORDER — GOSERELIN ACETATE 3.6 MG ~~LOC~~ IMPL
3.6000 mg | DRUG_IMPLANT | Freq: Once | SUBCUTANEOUS | Status: AC
  Administered 2020-03-31: 3.6 mg via SUBCUTANEOUS

## 2020-03-31 MED ORDER — GOSERELIN ACETATE 3.6 MG ~~LOC~~ IMPL
DRUG_IMPLANT | SUBCUTANEOUS | Status: AC
  Filled 2020-03-31: qty 3.6

## 2020-03-31 NOTE — Assessment & Plan Note (Signed)
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  Treatment summary: 1. Right mastectomy 08/29/2015 at [facility] (Dr.Singh): IDC with invol of skin and skin ulceration, breast capsule inv cancer, superior medial margin positive, 1/1 LN positive, grade 3, 14.3 cm, 3.5 cm, ALI, chest wall involv, ER 90-100%, PR 80-90%, HER-2 negative, Ki 67 40-50% T4CN1 (St 3B) 2. Patient started tamoxifen but developed severe headache with severe hypertension and tamoxifen was discontinued 03/17/2016. 3. 02/2017 started on Exemestane, Zoladex started 05/29/2017 4.  Hospitalization for paraplegia: T9 vertebral body compression status post decompressive laminectomy and tumor resection 01/04/2020 5.  Palliative radiation to the thoracic spine 01/28/2020 ----------------------------------------------------------------------------------------------------------------- Treatment plan: Ibrance with exemestane and Zoladex started 03/03/2020 Wound care issues with the fungating tumor:  Ibrance toxicities:  Return to clinic in 1 month with labs and follow-up 

## 2020-04-06 ENCOUNTER — Telehealth: Payer: Self-pay | Admitting: Hematology and Oncology

## 2020-04-06 NOTE — Telephone Encounter (Signed)
Scheduled per 05/27 los, called patient and left a voicemail.

## 2020-04-11 ENCOUNTER — Encounter: Payer: Medicare PPO | Admitting: Physical Medicine and Rehabilitation

## 2020-04-19 ENCOUNTER — Other Ambulatory Visit: Payer: Self-pay | Admitting: *Deleted

## 2020-04-19 MED ORDER — PALBOCICLIB 125 MG PO TABS: 125.0000 mg | ORAL_TABLET | Freq: Every day | ORAL | 2 refills | Status: DC

## 2020-04-26 ENCOUNTER — Telehealth: Payer: Self-pay | Admitting: Hematology and Oncology

## 2020-04-26 NOTE — Telephone Encounter (Signed)
R/s appt on 6/24 to a later time per pt request.

## 2020-04-28 ENCOUNTER — Inpatient Hospital Stay: Payer: Medicare PPO | Attending: Hematology and Oncology

## 2020-04-28 ENCOUNTER — Inpatient Hospital Stay: Payer: Medicare PPO

## 2020-04-28 ENCOUNTER — Inpatient Hospital Stay: Payer: Medicare PPO | Admitting: Hematology and Oncology

## 2020-04-28 ENCOUNTER — Other Ambulatory Visit: Payer: Self-pay

## 2020-04-28 VITALS — BP 138/77 | HR 105 | Temp 98.1°F | Resp 18

## 2020-04-28 DIAGNOSIS — Z9011 Acquired absence of right breast and nipple: Secondary | ICD-10-CM | POA: Diagnosis not present

## 2020-04-28 DIAGNOSIS — C50411 Malignant neoplasm of upper-outer quadrant of right female breast: Secondary | ICD-10-CM

## 2020-04-28 DIAGNOSIS — Z923 Personal history of irradiation: Secondary | ICD-10-CM | POA: Diagnosis not present

## 2020-04-28 DIAGNOSIS — Z170 Estrogen receptor positive status [ER+]: Secondary | ICD-10-CM | POA: Diagnosis not present

## 2020-04-28 DIAGNOSIS — Z79811 Long term (current) use of aromatase inhibitors: Secondary | ICD-10-CM | POA: Diagnosis not present

## 2020-04-28 DIAGNOSIS — G8220 Paraplegia, unspecified: Secondary | ICD-10-CM | POA: Diagnosis not present

## 2020-04-28 DIAGNOSIS — Z79899 Other long term (current) drug therapy: Secondary | ICD-10-CM | POA: Diagnosis not present

## 2020-04-28 DIAGNOSIS — C7951 Secondary malignant neoplasm of bone: Secondary | ICD-10-CM | POA: Insufficient documentation

## 2020-04-28 LAB — CMP (CANCER CENTER ONLY)
ALT: 10 U/L (ref 0–44)
AST: 16 U/L (ref 15–41)
Albumin: 4.3 g/dL (ref 3.5–5.0)
Alkaline Phosphatase: 120 U/L (ref 38–126)
Anion gap: 12 (ref 5–15)
BUN: 10 mg/dL (ref 6–20)
CO2: 26 mmol/L (ref 22–32)
Calcium: 9.8 mg/dL (ref 8.9–10.3)
Chloride: 106 mmol/L (ref 98–111)
Creatinine: 0.77 mg/dL (ref 0.44–1.00)
GFR, Est AFR Am: >60 mL/min (ref >60)
GFR, Estimated: >60 mL/min (ref >60)
Glucose, Bld: 132 mg/dL — ABNORMAL HIGH (ref 70–99)
Potassium: 3.3 mmol/L — ABNORMAL LOW (ref 3.5–5.1)
Sodium: 144 mmol/L (ref 135–145)
Total Bilirubin: 0.4 mg/dL (ref 0.3–1.2)
Total Protein: 7.2 g/dL (ref 6.5–8.1)

## 2020-04-28 LAB — CBC WITH DIFFERENTIAL (CANCER CENTER ONLY)
Abs Immature Granulocytes: 0.01 10*3/uL (ref 0.00–0.07)
Basophils Absolute: 0 10*3/uL (ref 0.0–0.1)
Basophils Relative: 2 %
Eosinophils Absolute: 0 10*3/uL (ref 0.0–0.5)
Eosinophils Relative: 1 %
HCT: 36.2 % (ref 36.0–46.0)
Hemoglobin: 12.3 g/dL (ref 12.0–15.0)
Immature Granulocytes: 1 %
Lymphocytes Relative: 28 %
Lymphs Abs: 0.6 10*3/uL — ABNORMAL LOW (ref 0.7–4.0)
MCH: 31.4 pg (ref 26.0–34.0)
MCHC: 34 g/dL (ref 30.0–36.0)
MCV: 92.3 fL (ref 80.0–100.0)
Monocytes Absolute: 0.2 10*3/uL (ref 0.1–1.0)
Monocytes Relative: 10 %
Neutro Abs: 1.3 10*3/uL — ABNORMAL LOW (ref 1.7–7.7)
Neutrophils Relative %: 58 %
Platelet Count: 185 10*3/uL (ref 150–400)
RBC: 3.92 MIL/uL (ref 3.87–5.11)
RDW: 15.9 % — ABNORMAL HIGH (ref 11.5–15.5)
WBC Count: 2.2 10*3/uL — ABNORMAL LOW (ref 4.0–10.5)
nRBC: 0 % (ref 0.0–0.2)

## 2020-04-28 MED ORDER — GOSERELIN ACETATE 3.6 MG ~~LOC~~ IMPL
DRUG_IMPLANT | SUBCUTANEOUS | Status: AC
  Filled 2020-04-28: qty 3.6

## 2020-04-28 MED ORDER — GOSERELIN ACETATE 3.6 MG ~~LOC~~ IMPL
3.6000 mg | DRUG_IMPLANT | Freq: Once | SUBCUTANEOUS | Status: AC
  Administered 2020-04-28: 3.6 mg via SUBCUTANEOUS

## 2020-04-28 MED FILL — IBRANCE 125 MG TABS: 125 | 28 days supply | Qty: 21 | Fill #2

## 2020-04-28 NOTE — Patient Instructions (Signed)

## 2020-04-28 NOTE — Assessment & Plan Note (Deleted)
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  Treatment summary: 1.Right mastectomy 08/29/2015 at [place] (Dr.Singh): IDC with invol of skin and skin ulceration, breast capsule inv cancer, superior medial margin positive, 1/1 LN positive, grade 3, 14.3 cm, 3.5 cm, ALI, chest wall involv, ER 90-100%, PR 80-90%, HER-2 negative, Ki 67 40-50% T4CN1 (St 3B) 2.Patient started tamoxifen but developed severe headache with severe hypertension and tamoxifen was discontinued 03/17/2016. 3.02/2017 started on Exemestane, Zoladex started 05/29/2017 4.Hospitalization for paraplegia: T9 vertebral body compression status post decompressive laminectomy and tumor resection 01/04/2020 5.Palliative radiation to the thoracic spine 01/28/2020 ----------------------------------------------------------------------------------------------------------------- Treatment plan: Ibrance with exemestane and Zoladex started 03/03/2020 Wound care issues with the fungating tumor:  Ibrance toxicities: ANC: 1.4: Continue with the same dosage. Overall she is tolerating it very well. Our plan is to obtain scans in 3 months.  Return to clinic in 1 month with labs, injection and follow-up

## 2020-05-17 ENCOUNTER — Telehealth: Payer: Self-pay

## 2020-05-17 NOTE — Telephone Encounter (Signed)
Oral Oncology Patient Advocate Encounter   Was successful in securing patient an $66 grant from Patient [employer] [employer] [facility]) to provide copayment coverage for Ibrance.  This will keep the out of pocket expense at $0.     I have spoken with the patient.    The billing information is as follows and has been shared with [place].   Member ID: [id number] Group ID: [id number] RxBin: [id number] Dates of Eligibility: 02/17/20 through 05/16/21  Fund:  [place] [facility] [place] Phone [phone] Fax [phone] 05/17/2020 2:06 PM

## 2020-05-24 MED FILL — IBRANCE 125 MG TABS: 125 | 28 days supply | Qty: 21 | Fill #0

## 2020-05-30 ENCOUNTER — Telehealth: Payer: Self-pay | Admitting: Hematology and Oncology

## 2020-05-30 NOTE — Telephone Encounter (Signed)
Scheduled appt per 7/26 sch msg. Called and left msg. Ok to push appt out per Merleen Nicely, Therapist, sports. Mailed printout

## 2020-06-02 ENCOUNTER — Telehealth: Payer: Self-pay | Admitting: Hematology and Oncology

## 2020-06-02 ENCOUNTER — Other Ambulatory Visit: Payer: Self-pay

## 2020-06-02 ENCOUNTER — Telehealth: Payer: Self-pay

## 2020-06-02 DIAGNOSIS — C7949 Secondary malignant neoplasm of other parts of nervous system: Secondary | ICD-10-CM

## 2020-06-02 NOTE — Telephone Encounter (Signed)
Scheduled appt per 7/29 sch msg - unable to reach pt - left message with appt date and time

## 2020-06-02 NOTE — Telephone Encounter (Signed)
Pt called and states she needs lab and inj appt for 7/30 for her Zoladex inj.Per Dr Lindi Adie, pt's 8/9 appts need to be r/s for end of august d/t injection needing to be 28 days apart. Pt verbalizes understanding and requests dressing supplies for wound.

## 2020-06-03 ENCOUNTER — Inpatient Hospital Stay: Payer: Medicare PPO

## 2020-06-03 ENCOUNTER — Telehealth: Payer: Self-pay

## 2020-06-03 NOTE — Telephone Encounter (Signed)
Charge nurse andrea RN calling pt to let her know to reschedule.

## 2020-06-06 ENCOUNTER — Other Ambulatory Visit: Payer: Self-pay

## 2020-06-06 ENCOUNTER — Inpatient Hospital Stay: Payer: Medicare PPO | Attending: Hematology and Oncology

## 2020-06-06 ENCOUNTER — Inpatient Hospital Stay: Payer: Medicare PPO

## 2020-06-06 VITALS — BP 148/87 | HR 94 | Temp 98.3°F | Resp 17

## 2020-06-06 DIAGNOSIS — Z9011 Acquired absence of right breast and nipple: Secondary | ICD-10-CM | POA: Diagnosis not present

## 2020-06-06 DIAGNOSIS — Z923 Personal history of irradiation: Secondary | ICD-10-CM | POA: Diagnosis not present

## 2020-06-06 DIAGNOSIS — Z79899 Other long term (current) drug therapy: Secondary | ICD-10-CM | POA: Diagnosis not present

## 2020-06-06 DIAGNOSIS — Z170 Estrogen receptor positive status [ER+]: Secondary | ICD-10-CM | POA: Insufficient documentation

## 2020-06-06 DIAGNOSIS — C50411 Malignant neoplasm of upper-outer quadrant of right female breast: Secondary | ICD-10-CM | POA: Insufficient documentation

## 2020-06-06 DIAGNOSIS — C7949 Secondary malignant neoplasm of other parts of nervous system: Secondary | ICD-10-CM

## 2020-06-06 DIAGNOSIS — Z79811 Long term (current) use of aromatase inhibitors: Secondary | ICD-10-CM | POA: Insufficient documentation

## 2020-06-06 LAB — CBC WITH DIFFERENTIAL (CANCER CENTER ONLY)
Abs Immature Granulocytes: 0.01 10*3/uL (ref 0.00–0.07)
Basophils Absolute: 0.1 10*3/uL (ref 0.0–0.1)
Basophils Relative: 3 %
Eosinophils Absolute: 0 10*3/uL (ref 0.0–0.5)
Eosinophils Relative: 0 %
HCT: 38.9 % (ref 36.0–46.0)
Hemoglobin: 13.6 g/dL (ref 12.0–15.0)
Immature Granulocytes: 0 %
Lymphocytes Relative: 23 %
Lymphs Abs: 0.6 10*3/uL — ABNORMAL LOW (ref 0.7–4.0)
MCH: 33.5 pg (ref 26.0–34.0)
MCHC: 35 g/dL (ref 30.0–36.0)
MCV: 95.8 fL (ref 80.0–100.0)
Monocytes Absolute: 0.1 10*3/uL (ref 0.1–1.0)
Monocytes Relative: 5 %
Neutro Abs: 1.7 10*3/uL (ref 1.7–7.7)
Neutrophils Relative %: 69 %
Platelet Count: 395 10*3/uL (ref 150–400)
RBC: 4.06 MIL/uL (ref 3.87–5.11)
RDW: 15.6 % — ABNORMAL HIGH (ref 11.5–15.5)
WBC Count: 2.4 10*3/uL — ABNORMAL LOW (ref 4.0–10.5)
nRBC: 0 % (ref 0.0–0.2)

## 2020-06-06 LAB — CMP (CANCER CENTER ONLY)
ALT: 9 U/L (ref 0–44)
AST: 19 U/L (ref 15–41)
Albumin: 4.7 g/dL (ref 3.5–5.0)
Alkaline Phosphatase: 124 U/L (ref 38–126)
Anion gap: 11 (ref 5–15)
BUN: 9 mg/dL (ref 6–20)
CO2: 27 mmol/L (ref 22–32)
Calcium: 10.6 mg/dL — ABNORMAL HIGH (ref 8.9–10.3)
Chloride: 105 mmol/L (ref 98–111)
Creatinine: 0.92 mg/dL (ref 0.44–1.00)
GFR, Est AFR Am: >60 mL/min (ref >60)
GFR, Estimated: >60 mL/min (ref >60)
Glucose, Bld: 100 mg/dL — ABNORMAL HIGH (ref 70–99)
Potassium: 3.4 mmol/L — ABNORMAL LOW (ref 3.5–5.1)
Sodium: 143 mmol/L (ref 135–145)
Total Bilirubin: 0.6 mg/dL (ref 0.3–1.2)
Total Protein: 7.9 g/dL (ref 6.5–8.1)

## 2020-06-06 MED ORDER — GOSERELIN ACETATE 3.6 MG ~~LOC~~ IMPL
3.6000 mg | DRUG_IMPLANT | Freq: Once | SUBCUTANEOUS | Status: AC
  Administered 2020-06-06: 3.6 mg via SUBCUTANEOUS

## 2020-06-06 NOTE — Patient Instructions (Signed)

## 2020-06-13 ENCOUNTER — Ambulatory Visit: Payer: Medicare PPO

## 2020-06-13 ENCOUNTER — Other Ambulatory Visit: Payer: Medicare PPO

## 2020-06-13 ENCOUNTER — Ambulatory Visit: Payer: Medicare PPO | Admitting: Hematology and Oncology

## 2020-06-27 MED FILL — IBRANCE 125 MG TABS: 125 | 28 days supply | Qty: 21 | Fill #1

## 2020-06-29 NOTE — Progress Notes (Signed)
Patient Care Team: Patient, [name] as PCP - General (General Practice)  DIAGNOSIS:    ICD-10-CM   1. Malignant neoplasm of upper-outer quadrant of right breast in female, estrogen receptor positive ([place])  C50.411    Z17.0     SUMMARY OF ONCOLOGIC HISTORY: Oncology History  Breast cancer of upper-outer quadrant of right female breast ([place])  09/21/2010 Mammogram   right breast linear and segmental pleomorphic calcifications from 4:00 to 6:00 position ultrasound revealed 1.6 cm and 1.1 cm masses   09/27/2010 Initial Biopsy   ultrasound-guided biopsy of all masses showed DCIS grade 2; patient went to cancer treatment centers of [place] for second opinion and delayed therapy   01/26/2011 Surgery   right mastectomy followed by reconstruction: 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 But she could not afford these trips.   01/08/2014 Breast MRI   right breast multiple enhancing masses within the soft tissues largest 5.6 cm in wall skin surface and the capsule of the silicone prosthesis, contiguous nodules involving in inferomedial breast and tired and subcentimeter nodules across the midline   08/29/2015 Surgery   Right mastectomy: IDC with invol of skin and skin ulceration, breast capsule inv cancer, superior medial margin positive, 1/1 LN positive, grade 3, 14.3 cm, 3.5 cm, ALI, chest wall involv, ER 90-100%, PR 80-90%, HER-2 negative, Ki 67 40-50% T4CN1 (St 3B)   03/05/2016 -  Anti-estrogen oral therapy   Tamoxifen 20 mg daily stopped due to headache and  uncontrolled hypertension. Decrease to 10 mg daily 04/03/2016, stopped June 2017 and took an estrogen metabolizer over-the-counter; started Aromasin April 2018 from a [place] physician, Zoladex started on 05/2017 and switched to Letrozole in 09/2017   01/04/2020 Surgery   Patient presented to the ED on 01/04/20 for worsening lower extremity numbness and underwent a decompressive laminectomy with tumor resection at T9 that was complicated with acute blood loss anemia and leukocytosis.   01/28/2020 - 02/15/2020 Radiation Therapy   Palliative radiation at site of thoracic tumor resection   03/03/2020 Miscellaneous    Ibrance with exemestane and Zoladex     CHIEF COMPLIANT: Follow-up of metastatic breast cancer  INTERVAL HISTORY: Misty Arnold is a 49 y.o. with above-mentioned history of metastatic breast cancerwhois currently onIbrance withZoladexandletrozole.She presentsto the clinic today for follow-up. She tells me that the metastatic disease with cutaneous involvement is relatively stable. She tells me that when her stress levels are low the cancer appears to be smaller. She is managing her own wounds by herself. She is tolerating Ibrance extremely well. She is also receiving Zoladex injections.  ALLERGIES:  is allergic to amoxicillin, penicillins, pork-derived products, and tamoxifen.  MEDICATIONS:  Current Outpatient Medications  Medication Sig Dispense Refill  . amLODipine (NORVASC) 10 MG tablet TAKE 1 TABLET BY MOUTH EVERY DAY 30 tablet 1  . acetaminophen (TYLENOL) 325 MG tablet Take 2 tablets (650 mg total) by mouth every 4 (four) hours as needed for mild pain ((score 1 to 3) or temp > 100.5). 30 tablet 0  . Ascorbic Acid (VITAMIN C PO) Take by mouth.    Marland Kitchen  Bioflavonoid Products (ESTER C PO) Take 1 tablet by mouth with breakfast, with lunch, and with evening meal.     . bisacodyl (DULCOLAX) 10 MG suppository Place 1 suppository (10 mg total) rectally daily as needed for moderate  constipation. 12 suppository 0  . BLACK COHOSH EXTRACT PO Take 1 tablet by mouth in the morning, at noon, and at bedtime.    Marland Kitchen BLACK CURRANT SEED OIL PO Take by mouth.    . calcium carbonate (OS-CAL - DOSED IN MG OF ELEMENTAL CALCIUM) 1250 (500 Ca) MG tablet Take 1 tablet (500 mg of elemental calcium total) by mouth 2 (two) times daily with a meal.    . CVS D3 125 MCG (5000 UT) capsule TAKE 1 CAPSULE BY MOUTH TWICE A DAY 60 capsule 5  . diazepam (VALIUM) 5 MG tablet Take 1-2 tablets (5-10 mg total) by mouth every 6 (six) hours as needed for muscle spasms. 30 tablet 0  . Flaxseed Oil OIL Take 5 mLs by mouth in the morning, at noon, and at bedtime.     Marland Kitchen goserelin (ZOLADEX) 3.6 MG injection Inject 3.6 mg into the skin every 28 (twenty-eight) days.    Marland Kitchen letrozole (FEMARA) 2.5 MG tablet Take 1 tablet (2.5 mg total) by mouth daily. 90 tablet 1  . Oxycodone HCl 10 MG TABS Take 1 tablet (10 mg total) by mouth every 3 (three) hours as needed ((score 7 to 10)). 180 tablet 0  . palbociclib (IBRANCE) 125 MG tablet Take 1 tablet (125 mg total) by mouth daily. Take for 21 days on, 7 days off, repeat every 28 days. 21 tablet 2  . polyethylene glycol (MIRALAX / GLYCOLAX) 17 g packet Take 17 g by mouth daily as needed for mild constipation. 14 each 0  . sucralfate (CARAFATE) 1 g tablet Take 1 tablet (1 g total) by mouth 4 (four) times daily -  with meals and at bedtime. Crush and dissolve in 10 mL of warm water prior to swallowing 120 tablet 1   No current facility-administered medications for this visit.    PHYSICAL EXAMINATION: ECOG PERFORMANCE STATUS: 1 - Symptomatic but completely ambulatory  Vitals:   06/30/20 1143  BP: (!) 168/101  Pulse: (!) 104  Resp: 17  Temp: 98 F (36.7 C)  SpO2: 100%   Filed Weights   06/30/20 1143  Weight: 113 lb 14.4 oz (51.7 kg)    LABORATORY DATA:  I have reviewed the data as listed CMP Latest Ref Rng & Units 06/06/2020 04/28/2020 03/31/2020  Glucose 70 - 99 mg/dL  100(H) 132(H) 105(H)  BUN 6 - 20 mg/dL 9 10 13   Creatinine 0.44 - 1.00 mg/dL 0.92 0.77 0.62  Sodium 135 - 145 mmol/L 143 144 145  Potassium 3.5 - 5.1 mmol/L 3.4(L) 3.3(L) 3.4(L)  Chloride 98 - 111 mmol/L 105 106 105  CO2 22 - 32 mmol/L 27 26 29   Calcium 8.9 - 10.3 mg/dL 10.6(H) 9.8 9.8  Total Protein 6.5 - 8.1 g/dL 7.9 7.2 7.7  Total Bilirubin 0.3 - 1.2 mg/dL 0.6 0.4 <0.1(L)  Alkaline Phos 38 - 126 U/L 124 120 97  AST 15 - 41 U/L 19 16 36  ALT 0 - 44 U/L 9 10 48(H)    Lab Results  Component Value Date   WBC 3.0 (L) 06/30/2020   HGB 13.1 06/30/2020   HCT 37.2 06/30/2020   MCV 96.9 06/30/2020   PLT 251 06/30/2020   NEUTROABS 2.1 06/30/2020    ASSESSMENT & PLAN:  Breast cancer of upper-outer quadrant of right female breast ([place]) Recurrent right breast cancer initially DCIS treated with mastectomy followed by reconstruction and later developed chest wall recurrence in 2013 ER/PR positive HER-2 negative, patient underwent alternative therapies in [place] but could not afford the trips and hence she has not been on any breast cancer therapy for a long time.  Treatment summary: 1.Right mastectomy 08/29/2015 at [facility] (Dr.Singh): IDC with invol of skin and skin ulceration, breast capsule inv cancer, superior medial margin positive, 1/1 LN positive, grade 3, 14.3 cm, 3.5 cm, ALI, chest wall involv, ER 90-100%, PR 80-90%, HER-2 negative, Ki 67 40-50% T4CN1 (St 3B) 2.Patient started tamoxifen but developed severe headache with severe hypertension and tamoxifen was discontinued 03/17/2016. 3.02/2017 started on Exemestane, Zoladex started 05/29/2017 4.Hospitalization for paraplegia: T9 vertebral body compression status post decompressive laminectomy and tumor resection 01/04/2020 5.Palliative radiation to the thoracic spine 01/28/2020 ----------------------------------------------------------------------------------------------------------------- Treatment plan: Ibrance with exemestane and  Zoladex started 03/03/2020 Wound care issues with the fungating tumor:  Ibrance toxicities: ANC: 2.1: Continue with the same dosage. Overall she is tolerating it very well. She'll come monthly for Zoladex injections. We'll plan on obtaining CT chest abdomen pelvis in 2 months and follow-up after that.     No orders of the defined types were placed in this encounter.  The patient has a good understanding of the overall plan. she agrees with it. she will call with any problems that may develop before the next visit here.  Total time spent: 30 mins including face to face time and time spent for planning, charting and coordination of care  Nicholas Lose, MD 06/30/2020  I, Cloyde Reams Dorshimer, am acting as scribe for Dr. Nicholas Lose.  I have reviewed the above documentation for accuracy and completeness, and I agree with the above.

## 2020-06-30 ENCOUNTER — Inpatient Hospital Stay (HOSPITAL_BASED_OUTPATIENT_CLINIC_OR_DEPARTMENT_OTHER): Payer: Medicare PPO | Admitting: Hematology and Oncology

## 2020-06-30 ENCOUNTER — Inpatient Hospital Stay: Payer: Medicare PPO

## 2020-06-30 ENCOUNTER — Other Ambulatory Visit: Payer: Self-pay

## 2020-06-30 ENCOUNTER — Encounter: Payer: Self-pay | Admitting: *Deleted

## 2020-06-30 VITALS — BP 168/101 | HR 104 | Temp 98.0°F | Resp 17 | Ht 65.0 in | Wt 113.9 lb

## 2020-06-30 DIAGNOSIS — C50919 Malignant neoplasm of unspecified site of unspecified female breast: Secondary | ICD-10-CM | POA: Diagnosis not present

## 2020-06-30 DIAGNOSIS — Z170 Estrogen receptor positive status [ER+]: Secondary | ICD-10-CM

## 2020-06-30 DIAGNOSIS — C50411 Malignant neoplasm of upper-outer quadrant of right female breast: Secondary | ICD-10-CM | POA: Diagnosis not present

## 2020-06-30 LAB — CBC WITH DIFFERENTIAL (CANCER CENTER ONLY)
Abs Immature Granulocytes: 0.01 10*3/uL (ref 0.00–0.07)
Basophils Absolute: 0.1 10*3/uL (ref 0.0–0.1)
Basophils Relative: 2 %
Eosinophils Absolute: 0 10*3/uL (ref 0.0–0.5)
Eosinophils Relative: 0 %
HCT: 37.2 % (ref 36.0–46.0)
Hemoglobin: 13.1 g/dL (ref 12.0–15.0)
Immature Granulocytes: 0 %
Lymphocytes Relative: 20 %
Lymphs Abs: 0.6 10*3/uL — ABNORMAL LOW (ref 0.7–4.0)
MCH: 34.1 pg — ABNORMAL HIGH (ref 26.0–34.0)
MCHC: 35.2 g/dL (ref 30.0–36.0)
MCV: 96.9 fL (ref 80.0–100.0)
Monocytes Absolute: 0.3 10*3/uL (ref 0.1–1.0)
Monocytes Relative: 9 %
Neutro Abs: 2.1 10*3/uL (ref 1.7–7.7)
Neutrophils Relative %: 69 %
Platelet Count: 251 10*3/uL (ref 150–400)
RBC: 3.84 MIL/uL — ABNORMAL LOW (ref 3.87–5.11)
RDW: 14.6 % (ref 11.5–15.5)
WBC Count: 3 10*3/uL — ABNORMAL LOW (ref 4.0–10.5)
nRBC: 0 % (ref 0.0–0.2)

## 2020-06-30 LAB — CMP (CANCER CENTER ONLY)
ALT: 7 U/L (ref 0–44)
AST: 19 U/L (ref 15–41)
Albumin: 4.7 g/dL (ref 3.5–5.0)
Alkaline Phosphatase: 126 U/L (ref 38–126)
Anion gap: 8 (ref 5–15)
BUN: 13 mg/dL (ref 6–20)
CO2: 29 mmol/L (ref 22–32)
Calcium: 10.4 mg/dL — ABNORMAL HIGH (ref 8.9–10.3)
Chloride: 106 mmol/L (ref 98–111)
Creatinine: 0.96 mg/dL (ref 0.44–1.00)
GFR, Est AFR Am: >60 mL/min (ref >60)
GFR, Estimated: >60 mL/min (ref >60)
Glucose, Bld: 88 mg/dL (ref 70–99)
Potassium: 3.2 mmol/L — ABNORMAL LOW (ref 3.5–5.1)
Sodium: 143 mmol/L (ref 135–145)
Total Bilirubin: 0.5 mg/dL (ref 0.3–1.2)
Total Protein: 7.9 g/dL (ref 6.5–8.1)

## 2020-06-30 MED ORDER — AMLODIPINE BESYLATE 10 MG PO TABS: 10.0000 mg | ORAL_TABLET | Freq: Every day | ORAL | 3 refills | Status: DC

## 2020-06-30 MED ORDER — CALCIUM CARBONATE 1250 (500 CA) MG PO TABS: 1.0000 | ORAL_TABLET | Freq: Two times a day (BID) | ORAL | Status: DC

## 2020-06-30 MED ORDER — CVS D3 125 MCG (5000 UT) PO CAPS: 5000.0000 [IU] | ORAL_CAPSULE | Freq: Two times a day (BID) | ORAL | 3 refills | Status: DC

## 2020-06-30 MED FILL — AMLODIPINE BESYLATE 10 MG T: 10 | 30 days supply | Qty: 30 | Fill #0

## 2020-06-30 NOTE — Progress Notes (Unsigned)
Per pharmacy patient couldn't get injection today because it was too early and insurance would not pay. Informed patient to come back on 8/31 and she acknowledged understanding

## 2020-06-30 NOTE — Assessment & Plan Note (Signed)
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  Treatment summary: 1.Right mastectomy 08/29/2015 at [facility] (Dr.Singh): IDC with invol of skin and skin ulceration, breast capsule inv cancer, superior medial margin positive, 1/1 LN positive, grade 3, 14.3 cm, 3.5 cm, ALI, chest wall involv, ER 90-100%, PR 80-90%, HER-2 negative, Ki 67 40-50% T4CN1 (St 3B) 2.Patient started tamoxifen but developed severe headache with severe hypertension and tamoxifen was discontinued 03/17/2016. 3.02/2017 started on Exemestane, Zoladex started 05/29/2017 4.Hospitalization for paraplegia: T9 vertebral body compression status post decompressive laminectomy and tumor resection 01/04/2020 5.Palliative radiation to the thoracic spine 01/28/2020 ----------------------------------------------------------------------------------------------------------------- Treatment plan: Ibrance with exemestane and Zoladex started 03/03/2020 Wound care issues with the fungating tumor:  Ibrance toxicities: ANC: 1.4: Continue with the same dosage. Overall she is tolerating it very well.  We will schedule scans and follow-up in 1 month

## 2020-06-30 NOTE — Progress Notes (Signed)
Pt unable to receive Zoladex injection today due to it being less than 28 days since previous injection.  Injection re schedule to 07/05/20 and pt verbalized understanding of apt date and time.

## 2020-07-05 ENCOUNTER — Inpatient Hospital Stay: Payer: Medicare PPO

## 2020-07-05 ENCOUNTER — Other Ambulatory Visit: Payer: Self-pay

## 2020-07-05 VITALS — BP 152/90 | HR 104 | Resp 18

## 2020-07-05 DIAGNOSIS — C50411 Malignant neoplasm of upper-outer quadrant of right female breast: Secondary | ICD-10-CM

## 2020-07-05 DIAGNOSIS — Z170 Estrogen receptor positive status [ER+]: Secondary | ICD-10-CM

## 2020-07-05 MED ORDER — GOSERELIN ACETATE 3.6 MG ~~LOC~~ IMPL
3.6000 mg | DRUG_IMPLANT | Freq: Once | SUBCUTANEOUS | Status: AC
  Administered 2020-07-05: 3.6 mg via SUBCUTANEOUS

## 2020-07-05 MED ORDER — GOSERELIN ACETATE 3.6 MG ~~LOC~~ IMPL
DRUG_IMPLANT | SUBCUTANEOUS | Status: AC
  Filled 2020-07-05: qty 3.6

## 2020-07-05 NOTE — Patient Instructions (Signed)

## 2020-08-04 ENCOUNTER — Inpatient Hospital Stay: Payer: Medicare PPO | Attending: Hematology and Oncology

## 2020-08-04 ENCOUNTER — Other Ambulatory Visit: Payer: Self-pay

## 2020-08-04 ENCOUNTER — Inpatient Hospital Stay: Payer: Medicare PPO

## 2020-08-04 DIAGNOSIS — C50411 Malignant neoplasm of upper-outer quadrant of right female breast: Secondary | ICD-10-CM | POA: Insufficient documentation

## 2020-08-04 DIAGNOSIS — Z170 Estrogen receptor positive status [ER+]: Secondary | ICD-10-CM | POA: Diagnosis not present

## 2020-08-04 DIAGNOSIS — Z9221 Personal history of antineoplastic chemotherapy: Secondary | ICD-10-CM | POA: Insufficient documentation

## 2020-08-04 DIAGNOSIS — C771 Secondary and unspecified malignant neoplasm of intrathoracic lymph nodes: Secondary | ICD-10-CM | POA: Diagnosis not present

## 2020-08-04 DIAGNOSIS — Z5111 Encounter for antineoplastic chemotherapy: Secondary | ICD-10-CM | POA: Insufficient documentation

## 2020-08-04 DIAGNOSIS — Z9011 Acquired absence of right breast and nipple: Secondary | ICD-10-CM | POA: Diagnosis not present

## 2020-08-04 DIAGNOSIS — Z79899 Other long term (current) drug therapy: Secondary | ICD-10-CM | POA: Insufficient documentation

## 2020-08-04 LAB — CMP (CANCER CENTER ONLY)
ALT: 11 U/L (ref 0–44)
AST: 18 U/L (ref 15–41)
Albumin: 4 g/dL (ref 3.5–5.0)
Alkaline Phosphatase: 109 U/L (ref 38–126)
Anion gap: 9 (ref 5–15)
BUN: 13 mg/dL (ref 6–20)
CO2: 28 mmol/L (ref 22–32)
Calcium: 10 mg/dL (ref 8.9–10.3)
Chloride: 105 mmol/L (ref 98–111)
Creatinine: 0.84 mg/dL (ref 0.44–1.00)
GFR, Est AFR Am: >60 mL/min (ref >60)
GFR, Estimated: >60 mL/min (ref >60)
Glucose, Bld: 99 mg/dL (ref 70–99)
Potassium: 3.6 mmol/L (ref 3.5–5.1)
Sodium: 142 mmol/L (ref 135–145)
Total Bilirubin: 0.5 mg/dL (ref 0.3–1.2)
Total Protein: 7.4 g/dL (ref 6.5–8.1)

## 2020-08-04 LAB — CBC WITH DIFFERENTIAL (CANCER CENTER ONLY)
Abs Immature Granulocytes: 0.01 10*3/uL (ref 0.00–0.07)
Basophils Absolute: 0.1 10*3/uL (ref 0.0–0.1)
Basophils Relative: 1 %
Eosinophils Absolute: 0 10*3/uL (ref 0.0–0.5)
Eosinophils Relative: 0 %
HCT: 39.1 % (ref 36.0–46.0)
Hemoglobin: 13.3 g/dL (ref 12.0–15.0)
Immature Granulocytes: 0 %
Lymphocytes Relative: 17 %
Lymphs Abs: 0.7 10*3/uL (ref 0.7–4.0)
MCH: 32.6 pg (ref 26.0–34.0)
MCHC: 34 g/dL (ref 30.0–36.0)
MCV: 95.8 fL (ref 80.0–100.0)
Monocytes Absolute: 0.2 10*3/uL (ref 0.1–1.0)
Monocytes Relative: 4 %
Neutro Abs: 3.3 10*3/uL (ref 1.7–7.7)
Neutrophils Relative %: 78 %
Platelet Count: 313 10*3/uL (ref 150–400)
RBC: 4.08 MIL/uL (ref 3.87–5.11)
RDW: 11.9 % (ref 11.5–15.5)
WBC Count: 4.3 10*3/uL (ref 4.0–10.5)
nRBC: 0 % (ref 0.0–0.2)

## 2020-08-04 MED ORDER — GOSERELIN ACETATE 3.6 MG ~~LOC~~ IMPL
DRUG_IMPLANT | SUBCUTANEOUS | Status: AC
  Filled 2020-08-04: qty 3.6

## 2020-08-04 MED ORDER — GOSERELIN ACETATE 3.6 MG ~~LOC~~ IMPL
3.6000 mg | DRUG_IMPLANT | Freq: Once | SUBCUTANEOUS | Status: AC
  Administered 2020-08-04: 3.6 mg via SUBCUTANEOUS

## 2020-08-04 MED FILL — IBRANCE 125 MG TABS: 125 | 28 days supply | Qty: 21 | Fill #2

## 2020-08-26 ENCOUNTER — Other Ambulatory Visit: Payer: Self-pay | Admitting: Hematology and Oncology

## 2020-08-29 ENCOUNTER — Ambulatory Visit (HOSPITAL_COMMUNITY)
Admission: RE | Admit: 2020-08-29 | Discharge: 2020-08-29 | Disposition: A | Discharge status: Home / Self Care | Payer: Medicare PPO | Source: Ambulatory Visit | Attending: Hematology and Oncology | Admitting: Hematology and Oncology

## 2020-08-29 ENCOUNTER — Other Ambulatory Visit: Payer: Self-pay

## 2020-08-29 ENCOUNTER — Encounter (HOSPITAL_COMMUNITY): Payer: Self-pay

## 2020-08-29 DIAGNOSIS — Z170 Estrogen receptor positive status [ER+]: Secondary | ICD-10-CM | POA: Diagnosis present

## 2020-08-29 DIAGNOSIS — C50919 Malignant neoplasm of unspecified site of unspecified female breast: Secondary | ICD-10-CM | POA: Diagnosis present

## 2020-08-29 DIAGNOSIS — C50411 Malignant neoplasm of upper-outer quadrant of right female breast: Secondary | ICD-10-CM | POA: Insufficient documentation

## 2020-08-29 MED ORDER — IOHEXOL 300 MG/ML  SOLN
100.0000 mL | Freq: Once | INTRAMUSCULAR | Status: AC | PRN
  Administered 2020-08-29: 100 mL via INTRAVENOUS

## 2020-08-30 NOTE — Assessment & Plan Note (Signed)
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  Treatment summary: 1.Right mastectomy 08/29/2015 at [facility] (Dr.Singh): IDC with invol of skin and skin ulceration, breast capsule inv cancer, superior medial margin positive, 1/1 LN positive, grade 3, 14.3 cm, 3.5 cm, ALI, chest wall involv, ER 90-100%, PR 80-90%, HER-2 negative, Ki 67 40-50% T4CN1 (St 3B) 2.Patient started tamoxifen but developed severe headache with severe hypertension and tamoxifen was discontinued 03/17/2016. 3.02/2017 started on Exemestane, Zoladex started 05/29/2017 4.Hospitalization for paraplegia: T9 vertebral body compression status post decompressive laminectomy and tumor resection 01/04/2020 5.Palliative radiation to the thoracic spine 01/28/2020 ----------------------------------------------------------------------------------------------------------------- Treatment plan: Ibrance with exemestane and Zoladex started 03/03/2020 Wound care issues with the fungating tumor  Ibrance toxicities: ANC: 2.1: Continue with the same dosage. Overall she is tolerating it very well. She'll come monthly for Zoladex injections. CT CAP 08/29/2020 

## 2020-08-30 NOTE — Progress Notes (Signed)
Patient Care Team: Patient, [name] as PCP - General (General Practice)  DIAGNOSIS:    ICD-10-CM   1. Malignant neoplasm of upper-outer quadrant of right breast in female, estrogen receptor positive ([place])  C50.411    Z17.0     SUMMARY OF ONCOLOGIC HISTORY: Oncology History  Breast cancer of upper-outer quadrant of right female breast ([place])  09/21/2010 Mammogram   right breast linear and segmental pleomorphic calcifications from 4:00 to 6:00 position ultrasound revealed 1.6 cm and 1.1 cm masses   09/27/2010 Initial Biopsy   ultrasound-guided biopsy of all masses showed DCIS grade 2; patient went to cancer treatment centers of [place] for second opinion and delayed therapy   01/26/2011 Surgery   right mastectomy followed by reconstruction: 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 But she could not afford these trips.   01/08/2014 Breast MRI   right breast multiple enhancing masses within the soft tissues largest 5.6 cm in wall skin surface and the capsule of the silicone prosthesis, contiguous nodules involving in inferomedial breast and tired and subcentimeter nodules across the midline   08/29/2015 Surgery   Right mastectomy: IDC with invol of skin and skin ulceration, breast capsule inv cancer, superior medial margin positive, 1/1 LN positive, grade 3, 14.3 cm, 3.5 cm, ALI, chest wall involv, ER 90-100%, PR 80-90%, HER-2 negative, Ki 67 40-50% T4CN1 (St 3B)   03/05/2016 -  Anti-estrogen oral therapy   Tamoxifen 20 mg daily stopped due to headache and  uncontrolled hypertension. Decrease to 10 mg daily 04/03/2016, stopped June 2017 and took an estrogen metabolizer over-the-counter; started Aromasin April 2018 from a [place] physician, Zoladex started on 05/2017 and switched to Letrozole in 09/2017   01/04/2020 Surgery   Patient presented to the ED on 01/04/20 for worsening lower extremity numbness and underwent a decompressive laminectomy with tumor resection at T9 that was complicated with acute blood loss anemia and leukocytosis.   01/28/2020 - 02/15/2020 Radiation Therapy   Palliative radiation at site of thoracic tumor resection   03/03/2020 Miscellaneous    Ibrance with exemestane and Zoladex     CHIEF COMPLIANT: Follow-up of metastatic breast cancer, review scan  INTERVAL HISTORY: Misty Arnold is a 49 y.o. with above-mentioned history of metastatic breast cancerwhois currently onIbrance withZoladexandletrozole.CT CAP on 08/29/20 showed progression of disease, with enlargement of the primary right chest wall tumor, enlarging contralateral lesions in the left breast, bilateral axillary and subpectoral lymphadenopathy, and multiple osseous lesions in the thorax. She presentsto the clinic today for follow-up.  She is experiencing worsening pain in the back and ribs.  Previously she had radiation to the back. She is currently in a wheelchair.  ALLERGIES:  is allergic to amoxicillin, penicillins, pork-derived products, and tamoxifen.  MEDICATIONS:  Current Outpatient Medications  Medication Sig Dispense Refill  . acetaminophen (TYLENOL) 325 MG tablet Take 2 tablets (650 mg total) by mouth every 4 (four) hours as needed for mild pain ((score 1 to 3) or temp > 100.5). 30 tablet 0  . amLODipine (NORVASC) 10 MG tablet Take 1 tablet (10 mg total) by mouth daily. [place]  tablet 3  . Ascorbic Acid (VITAMIN C PO) Take by mouth.    . Bioflavonoid Products (ESTER C PO) Take 1 tablet by mouth with breakfast, with lunch, and with evening meal.     .  bisacodyl (DULCOLAX) 10 MG suppository Place 1 suppository (10 mg total) rectally daily as needed for moderate constipation. 12 suppository 0  . BLACK COHOSH EXTRACT PO Take 1 tablet by mouth in the morning, at noon, and at bedtime.    Marland Kitchen BLACK CURRANT SEED OIL PO Take by mouth.    . calcium carbonate (OS-CAL - DOSED IN MG OF ELEMENTAL CALCIUM) 1250 (500 Ca) MG tablet Take 1 tablet (500 mg of elemental calcium total) by mouth 2 (two) times daily with a meal.    . Cholecalciferol (CVS D3) 125 MCG (5000 UT) capsule Take 1 capsule (5,000 Units total) by mouth 2 (two) times daily. 180 capsule 3  . diazepam (VALIUM) 5 MG tablet Take 1-2 tablets (5-10 mg total) by mouth every 6 (six) hours as needed for muscle spasms. 30 tablet 0  . Flaxseed Oil OIL Take 5 mLs by mouth in the morning, at noon, and at bedtime.     Marland Kitchen goserelin (ZOLADEX) 3.6 MG injection Inject 3.6 mg into the skin every 28 (twenty-eight) days.    Leslee Home 125 MG tablet TAKE 1 TABLET (125 MG TOTAL) BY MOUTH DAILY. TAKE FOR 21 DAYS ON, 7 DAYS OFF, REPEAT EVERY 28 DAYS. 21 tablet 2  . letrozole (FEMARA) 2.5 MG tablet Take 1 tablet (2.5 mg total) by mouth daily. 90 tablet 1  . Oxycodone HCl 10 MG TABS Take 1 tablet (10 mg total) by mouth every 3 (three) hours as needed ((score 7 to 10)). 180 tablet 0  . polyethylene glycol (MIRALAX / GLYCOLAX) 17 g packet Take 17 g by mouth daily as needed for mild constipation. 14 each 0  . sucralfate (CARAFATE) 1 g tablet Take 1 tablet (1 g total) by mouth 4 (four) times daily -  with meals and at bedtime. Crush and dissolve in 10 mL of warm water prior to swallowing 120 tablet 1   No current facility-administered medications for this visit.    PHYSICAL EXAMINATION: ECOG PERFORMANCE STATUS: 1 - Symptomatic but completely ambulatory  Vitals:   08/31/20 1439  BP: (!) 149/90  Pulse: (!) 102  Resp: 18  Temp: (!) 97.2 F (36.2 C)  SpO2: 99%   Filed Weights   08/31/20 1439  Weight: 119 lb (54 kg)     LABORATORY DATA:  I have reviewed the data as listed CMP Latest Ref Rng & Units 08/04/2020 06/30/2020 06/06/2020  Glucose 70 - 99 mg/dL 99 88 100(H)  BUN 6 - 20 mg/dL 13 13 9   Creatinine 0.44 - 1.00 mg/dL 0.84 0.96 0.92  Sodium 135 - 145 mmol/L 142 143 143  Potassium 3.5 - 5.1 mmol/L 3.6 3.2(L) 3.4(L)  Chloride 98 - 111 mmol/L 105 106 105  CO2 22 - 32 mmol/L 28 29 27   Calcium 8.9 - 10.3 mg/dL 10.0 10.4(H) 10.6(H)  Total Protein 6.5 - 8.1 g/dL 7.4 7.9 7.9  Total Bilirubin 0.3 - 1.2 mg/dL 0.5 0.5 0.6  Alkaline Phos 38 - 126 U/L 109 126 124  AST 15 - 41 U/L 18 19 19   ALT 0 - 44 U/L 11 7 9     Lab Results  Component Value Date   WBC 4.3 08/04/2020   HGB 13.3 08/04/2020   HCT 39.1 08/04/2020   MCV 95.8  08/04/2020   PLT 313 08/04/2020   NEUTROABS 3.3 08/04/2020    ASSESSMENT & PLAN:  Breast cancer of upper-outer quadrant of right female breast ([place]) Recurrent right breast cancer initially DCIS treated with mastectomy followed by reconstruction and later developed chest wall recurrence in 2013 ER/PR positive HER-2 negative, patient underwent alternative therapies in [place] but could not afford the trips and hence she has not been on any breast cancer therapy for a long time.  Treatment summary: 1.Right mastectomy 08/29/2015 at [facility] (Dr.Singh): IDC with invol of skin and skin ulceration, breast capsule inv cancer, superior medial margin positive, 1/1 LN positive, grade 3, 14.3 cm, 3.5 cm, ALI, chest wall involv, ER 90-100%, PR 80-90%, HER-2 negative, Ki 67 40-50% T4CN1 (St 3B) 2.Patient started tamoxifen but developed severe headache with severe hypertension and tamoxifen was discontinued 03/17/2016. 3.02/2017 started on Exemestane, Zoladex started 05/29/2017 4.Hospitalization for paraplegia: T9 vertebral body compression status post decompressive laminectomy and tumor resection 01/04/2020 5.Palliative radiation to the thoracic spine  01/28/2020 ----------------------------------------------------------------------------------------------------------------- Treatment plan: Ibrance with exemestane and Zoladex started 03/03/2020 Wound care issues with the fungating tumor  Ibrance toxicities: ANC: 2.1: Continue with the same dosage. Overall she is tolerating it very well. She'll come monthly for Zoladex injections. CT CAP 08/29/2020: Progression of disease in the left chest wall tumor, progression bilateral axillary and pectoral lymphadenopathy and progression bone metastases.  Our plan is to discontinue Ibrance and plan subsequent treatment.  Patient does not want to do chemotherapy. We will do guardant 360 as well as rebiopsy the tumor and run breast prognostic panel and [facility] molecular testing.  We will discuss treatment plan when she comes back next month for follow-up.  No orders of the defined types were placed in this encounter.  The patient has a good understanding of the overall plan. she agrees with it. she will call with any problems that may develop before the next visit here.  Total time spent: 30 mins including face to face time and time spent for planning, charting and coordination of care  Nicholas Lose, MD 08/31/2020  I, Cloyde Reams Dorshimer, am acting as scribe for Dr. Nicholas Lose.  I have reviewed the above documentation for accuracy and completeness, and I agree with the above.

## 2020-08-31 ENCOUNTER — Inpatient Hospital Stay: Payer: Medicare PPO | Attending: Hematology and Oncology | Admitting: Hematology and Oncology

## 2020-08-31 ENCOUNTER — Ambulatory Visit: Payer: Medicare PPO

## 2020-08-31 ENCOUNTER — Encounter: Payer: Self-pay | Admitting: Hematology and Oncology

## 2020-08-31 ENCOUNTER — Other Ambulatory Visit: Payer: Self-pay | Admitting: *Deleted

## 2020-08-31 ENCOUNTER — Inpatient Hospital Stay: Payer: Medicare PPO

## 2020-08-31 ENCOUNTER — Other Ambulatory Visit: Payer: Self-pay

## 2020-08-31 DIAGNOSIS — Z5111 Encounter for antineoplastic chemotherapy: Secondary | ICD-10-CM | POA: Diagnosis not present

## 2020-08-31 DIAGNOSIS — Z79899 Other long term (current) drug therapy: Secondary | ICD-10-CM | POA: Diagnosis not present

## 2020-08-31 DIAGNOSIS — C50411 Malignant neoplasm of upper-outer quadrant of right female breast: Secondary | ICD-10-CM | POA: Insufficient documentation

## 2020-08-31 DIAGNOSIS — Z170 Estrogen receptor positive status [ER+]: Secondary | ICD-10-CM | POA: Diagnosis not present

## 2020-08-31 DIAGNOSIS — Z9011 Acquired absence of right breast and nipple: Secondary | ICD-10-CM | POA: Insufficient documentation

## 2020-08-31 DIAGNOSIS — I10 Essential (primary) hypertension: Secondary | ICD-10-CM | POA: Diagnosis not present

## 2020-08-31 DIAGNOSIS — C50919 Malignant neoplasm of unspecified site of unspecified female breast: Secondary | ICD-10-CM

## 2020-08-31 LAB — CMP (CANCER CENTER ONLY)
ALT: 12 U/L (ref 0–44)
AST: 23 U/L (ref 15–41)
Albumin: 4.3 g/dL (ref 3.5–5.0)
Alkaline Phosphatase: 111 U/L (ref 38–126)
Anion gap: 8 (ref 5–15)
BUN: 16 mg/dL (ref 6–20)
CO2: 29 mmol/L (ref 22–32)
Calcium: 9.9 mg/dL (ref 8.9–10.3)
Chloride: 107 mmol/L (ref 98–111)
Creatinine: 0.86 mg/dL (ref 0.44–1.00)
GFR, Estimated: >60 mL/min (ref >60)
Glucose, Bld: 126 mg/dL — ABNORMAL HIGH (ref 70–99)
Potassium: 3.2 mmol/L — ABNORMAL LOW (ref 3.5–5.1)
Sodium: 144 mmol/L (ref 135–145)
Total Bilirubin: 0.3 mg/dL (ref 0.3–1.2)
Total Protein: 7.2 g/dL (ref 6.5–8.1)

## 2020-08-31 LAB — CBC WITH DIFFERENTIAL (CANCER CENTER ONLY)
Abs Immature Granulocytes: 0.01 10*3/uL (ref 0.00–0.07)
Basophils Absolute: 0 10*3/uL (ref 0.0–0.1)
Basophils Relative: 1 %
Eosinophils Absolute: 0 10*3/uL (ref 0.0–0.5)
Eosinophils Relative: 1 %
HCT: 36.5 % (ref 36.0–46.0)
Hemoglobin: 12.6 g/dL (ref 12.0–15.0)
Immature Granulocytes: 0 %
Lymphocytes Relative: 22 %
Lymphs Abs: 0.7 10*3/uL (ref 0.7–4.0)
MCH: 33.1 pg (ref 26.0–34.0)
MCHC: 34.5 g/dL (ref 30.0–36.0)
MCV: 95.8 fL (ref 80.0–100.0)
Monocytes Absolute: 0.3 10*3/uL (ref 0.1–1.0)
Monocytes Relative: 8 %
Neutro Abs: 2.2 10*3/uL (ref 1.7–7.7)
Neutrophils Relative %: 68 %
Platelet Count: 365 10*3/uL (ref 150–400)
RBC: 3.81 MIL/uL — ABNORMAL LOW (ref 3.87–5.11)
RDW: 13.3 % (ref 11.5–15.5)
WBC Count: 3.3 10*3/uL — ABNORMAL LOW (ref 4.0–10.5)
nRBC: 0 % (ref 0.0–0.2)

## 2020-08-31 MED ORDER — GOSERELIN ACETATE 3.6 MG ~~LOC~~ IMPL
DRUG_IMPLANT | SUBCUTANEOUS | Status: AC
  Filled 2020-08-31: qty 3.6

## 2020-08-31 MED ORDER — GOSERELIN ACETATE 3.6 MG ~~LOC~~ IMPL
3.6000 mg | DRUG_IMPLANT | Freq: Once | SUBCUTANEOUS | Status: AC
  Administered 2020-08-31: 3.6 mg via SUBCUTANEOUS

## 2020-08-31 MED FILL — IBRANCE 125 MG TABS: 125 | 28 days supply | Qty: 21 | Fill #0

## 2020-08-31 NOTE — Patient Instructions (Signed)

## 2020-09-02 ENCOUNTER — Telehealth: Payer: Self-pay | Admitting: *Deleted

## 2020-09-02 NOTE — Telephone Encounter (Signed)
CT faxed to Dr Deri Fuelling

## 2020-09-05 ENCOUNTER — Telehealth: Payer: Self-pay | Admitting: Hematology and Oncology

## 2020-09-05 NOTE — Telephone Encounter (Signed)
Scheduled per 10/27 los. Called and spoke with pt, confirmed 11/29 appts

## 2020-09-07 DIAGNOSIS — C50919 Malignant neoplasm of unspecified site of unspecified female breast: Secondary | ICD-10-CM | POA: Diagnosis not present

## 2020-09-07 DIAGNOSIS — C7949 Secondary malignant neoplasm of other parts of nervous system: Secondary | ICD-10-CM | POA: Diagnosis not present

## 2020-09-07 DIAGNOSIS — C50411 Malignant neoplasm of upper-outer quadrant of right female breast: Secondary | ICD-10-CM | POA: Diagnosis not present

## 2020-09-15 LAB — GUARDANT 360

## 2020-09-20 ENCOUNTER — Other Ambulatory Visit: Payer: Self-pay | Admitting: Registered Nurse

## 2020-09-20 DIAGNOSIS — C50411 Malignant neoplasm of upper-outer quadrant of right female breast: Secondary | ICD-10-CM

## 2020-09-20 DIAGNOSIS — Z170 Estrogen receptor positive status [ER+]: Secondary | ICD-10-CM

## 2020-09-22 DIAGNOSIS — G8222 Paraplegia, incomplete: Secondary | ICD-10-CM | POA: Diagnosis not present

## 2020-09-23 MED FILL — IBRANCE 125 MG TABS: 125 | 28 days supply | Qty: 21 | Fill #1

## 2020-09-26 DIAGNOSIS — I10 Essential (primary) hypertension: Secondary | ICD-10-CM | POA: Diagnosis not present

## 2020-09-26 DIAGNOSIS — C7951 Secondary malignant neoplasm of bone: Secondary | ICD-10-CM | POA: Diagnosis not present

## 2020-10-03 ENCOUNTER — Inpatient Hospital Stay: Payer: Medicare PPO

## 2020-10-03 ENCOUNTER — Telehealth: Payer: Self-pay

## 2020-10-03 ENCOUNTER — Inpatient Hospital Stay: Payer: Medicare PPO | Admitting: Hematology and Oncology

## 2020-10-03 ENCOUNTER — Ambulatory Visit: Payer: Medicare PPO

## 2020-10-03 NOTE — Assessment & Plan Note (Signed)
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  Treatment summary: 1.Right mastectomy 08/29/2015 at [facility] (Dr.Singh): IDC with invol of skin and skin ulceration, breast capsule inv cancer, superior medial margin positive, 1/1 LN positive, grade 3, 14.3 cm, 3.5 cm, ALI, chest wall involv, ER 90-100%, PR 80-90%, HER-2 negative, Ki 67 40-50% T4CN1 (St 3B) 2.Patient started tamoxifen but developed severe headache with severe hypertension and tamoxifen was discontinued 03/17/2016. 3.02/2017 started on Exemestane, Zoladex started 05/29/2017 4.Hospitalization for paraplegia: T9 vertebral body compression status post decompressive laminectomy and tumor resection 01/04/2020 5.Palliative radiation to the thoracic spine 01/28/2020 ----------------------------------------------------------------------------------------------------------------- Treatment plan: Ibrance with exemestane and Zoladex started 03/03/2020 Wound care issues with the fungating tumor  Ibrance toxicities: ANC: 2.1: Continue with the same dosage. Overall she is tolerating it very well. She'll come monthly for Zoladex injections. CT CAP 08/29/2020

## 2020-10-03 NOTE — Telephone Encounter (Signed)
RN returned call, voicemail left for return call.  

## 2020-10-03 NOTE — Progress Notes (Addendum)
Patient Care Team: Patient, [name] as PCP - General (General Practice)  DIAGNOSIS:    ICD-10-CM   1. Malignant neoplasm of upper-outer quadrant of right breast in female, estrogen receptor positive ([place])  C50.411    Z17.0     SUMMARY OF ONCOLOGIC HISTORY: Oncology History  Breast cancer of upper-outer quadrant of right female breast ([place])  09/21/2010 Mammogram   right breast linear and segmental pleomorphic calcifications from 4:00 to 6:00 position ultrasound revealed 1.6 cm and 1.1 cm masses   09/27/2010 Initial Biopsy   ultrasound-guided biopsy of all masses showed DCIS grade 2; patient went to cancer treatment centers of [place] for second opinion and delayed therapy   01/26/2011 Surgery   right mastectomy followed by reconstruction: 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 But she could not afford these trips.   01/08/2014 Breast MRI   right breast multiple enhancing masses within the soft tissues largest 5.6 cm in wall skin surface and the capsule of the silicone prosthesis, contiguous nodules involving in inferomedial breast and tired and subcentimeter nodules across the midline   08/29/2015 Surgery   Right mastectomy: IDC with invol of skin and skin ulceration, breast capsule inv cancer, superior medial margin positive, 1/1 LN positive, grade 3, 14.3 cm, 3.5 cm, ALI, chest wall involv, ER 90-100%, PR 80-90%, HER-2 negative, Ki 67 40-50% T4CN1 (St 3B)   03/05/2016 -  Anti-estrogen oral therapy   Tamoxifen 20 mg daily stopped due to headache and  uncontrolled hypertension. Decrease to 10 mg daily 04/03/2016, stopped June 2017 and took an estrogen metabolizer over-the-counter; started Aromasin April 2018 from a [place] physician, Zoladex started on 05/2017 and switched to Letrozole in 09/2017   01/04/2020 Surgery   Patient presented to the ED on 01/04/20 for worsening lower extremity numbness and underwent a decompressive laminectomy with tumor resection at T9 that was complicated with acute blood loss anemia and leukocytosis.   01/28/2020 - 02/15/2020 Radiation Therapy   Palliative radiation at site of thoracic tumor resection   03/03/2020 Miscellaneous    Ibrance with exemestane and Zoladex     CHIEF COMPLIANT: Follow-up of metastatic breast cancer  INTERVAL HISTORY: Misty Arnold is a 49 y.o. with above-mentioned history of metastatic breast cancer. She presentsto the clinic today for follow-up.    ALLERGIES:  is allergic to amoxicillin, penicillins, pork-derived products, and tamoxifen.  MEDICATIONS:  Current Outpatient Medications  Medication Sig Dispense Refill  . acetaminophen (TYLENOL) 325 MG tablet Take 2 tablets (650 mg total) by mouth every 4 (four) hours as needed for mild pain ((score 1 to 3) or temp > 100.5). 30 tablet 0  . amLODipine (NORVASC) 10 MG tablet Take 1 tablet (10 mg total) by mouth daily. 90 tablet 3  . Ascorbic Acid (VITAMIN C PO) Take by mouth.    . Bioflavonoid Products (ESTER C PO) Take 1 tablet by mouth with breakfast, with lunch, and with evening meal.     . bisacodyl (DULCOLAX) 10 MG suppository Place 1 suppository (10 mg total) rectally daily as needed for moderate constipation. 12 suppository 0  . BLACK  COHOSH EXTRACT PO Take 1 tablet by mouth in the morning, at noon, and at bedtime.    Marland Kitchen BLACK CURRANT SEED OIL PO Take by mouth.    . calcium carbonate (OS-CAL - DOSED IN MG OF ELEMENTAL CALCIUM) 1250 (500 Ca) MG tablet Take 1 tablet (500 mg of elemental calcium total) by mouth 2 (two) times daily with  a meal.    . Cholecalciferol (CVS D3) 125 MCG (5000 UT) capsule Take 1 capsule (5,000 Units total) by mouth 2 (two) times daily. 180 capsule 3  . diazepam (VALIUM) 5 MG tablet Take 1-2 tablets (5-10 mg total) by mouth every 6 (six) hours as needed for muscle spasms. 30 tablet 0  . Flaxseed Oil OIL Take 5 mLs by mouth in the morning, at noon, and at bedtime.     Marland Kitchen goserelin (ZOLADEX) 3.6 MG injection Inject 3.6 mg into the skin every 28 (twenty-eight) days.    Leslee Home 125 MG tablet TAKE 1 TABLET (125 MG TOTAL) BY MOUTH DAILY. TAKE FOR 21 DAYS ON, 7 DAYS OFF, REPEAT EVERY 28 DAYS. 21 tablet 2  . letrozole (FEMARA) 2.5 MG tablet TAKE 1 TABLET BY MOUTH EVERY DAY 90 tablet 1  . Oxycodone HCl 10 MG TABS Take 1 tablet (10 mg total) by mouth every 3 (three) hours as needed ((score 7 to 10)). 180 tablet 0  . polyethylene glycol (MIRALAX / GLYCOLAX) 17 g packet Take 17 g by mouth daily as needed for mild constipation. 14 each 0  . sucralfate (CARAFATE) 1 g tablet Take 1 tablet (1 g total) by mouth 4 (four) times daily -  with meals and at bedtime. Crush and dissolve in 10 mL of warm water prior to swallowing 120 tablet 1   No current facility-administered medications for this visit.    PHYSICAL EXAMINATION: ECOG PERFORMANCE STATUS: 3 - Symptomatic, >50% confined to bed  Vitals:   10/04/20 1505  BP: (!) 142/84  Pulse: (!) 109  Resp: 20  Temp: 98.7 F (37.1 C)  SpO2: 100%   Filed Weights   10/04/20 1505  Weight: 118 lb 1.6 oz (53.6 kg)    LABORATORY DATA:  I have reviewed the data as listed CMP Latest Ref Rng & Units 08/31/2020 08/04/2020 06/30/2020  Glucose 70 - 99 mg/dL 126(H) 99 88  BUN 6 - 20 mg/dL 16 13 13   Creatinine 0.44 - 1.00 mg/dL 0.86 0.84 0.96  Sodium 135 - 145 mmol/L 144 142 143  Potassium 3.5 - 5.1 mmol/L 3.2(L) 3.6 3.2(L)  Chloride 98 - 111 mmol/L 107 105 106  CO2 22 - 32 mmol/L 29 28 29   Calcium 8.9 - 10.3 mg/dL 9.9 10.0 10.4(H)  Total Protein 6.5 - 8.1 g/dL 7.2 7.4 7.9   Total Bilirubin 0.3 - 1.2 mg/dL 0.3 0.5 0.5  Alkaline Phos 38 - 126 U/L 111 109 126  AST 15 - 41 U/L 23 18 19   ALT 0 - 44 U/L 12 11 7     Lab Results  Component Value Date   WBC 3.3 (L) 08/31/2020   HGB 12.6 08/31/2020   HCT 36.5 08/31/2020   MCV 95.8 08/31/2020   PLT 365 08/31/2020   NEUTROABS 2.2 08/31/2020    ASSESSMENT & PLAN:  Breast cancer of upper-outer quadrant of right female breast ([place]) Recurrent right breast cancer initially DCIS treated with mastectomy followed by reconstruction and later developed chest wall recurrence in 2013 ER/PR positive HER-2 negative, patient underwent alternative therapies in [place] but could not afford  the trips and hence she has not been on any breast cancer therapy for a long time.  Treatment summary: 1.Right mastectomy 08/29/2015 at [facility] (Dr.Singh): IDC with invol of skin and skin ulceration, breast capsule inv cancer, superior medial margin positive, 1/1 LN positive, grade 3, 14.3 cm, 3.5 cm, ALI, chest wall involv, ER 90-100%, PR 80-90%, HER-2 negative, Ki 67 40-50% T4CN1 (St 3B) 2.Patient started tamoxifen but developed severe headache with severe hypertension and tamoxifen was discontinued 03/17/2016. 3.02/2017 started on Exemestane, Zoladex started 05/29/2017 4.Hospitalization for paraplegia: T9 vertebral body compression status post decompressive laminectomy and tumor resection 01/04/2020 5.Palliative radiation to the thoracic spine 01/28/2020 6.  Ibrance with letrozole started April 2021-October 04, 2020 ----------------------------------------------------------------------------------------------------------------- Treatment plan: Switch treatment to alpelisib with letrozole and Zoladex to start 10/05/2020 Wound care issues with the fungating tumor   Alpelisib (Piqray) Counseling: Alpelisib was studied in SOLAR-1 clinical trial: 675 patients postmenopausal woman who progressed on hormonal therapy were randomized to Fulvestrant +  Alpelisib vs Fulvestrant. PFS was 11 months versus 5.7 months in patients who had PIK3CA mutation. Common side effects of Piqray are high blood sugar levels, increase in creatinine, diarrhea, rash, decrease in lymphocyte count, elevated liver enzymes, nausea, fatigue, anemia, increase in lipase, decreased appetite, stomatitis, vomiting, weight loss, low calcium levels, aPTT prolonged, and hair loss.    She'll come monthly for Zoladex injections. CT CAP 08/30/2020: Progression of disease with enlargement of the right chest wall tumor, enlarging contralateral lesions, bilateral axillary and subpectoral lymphadenopathy, multiple osseous lesions in the thorax.  Small amount of pericardial fluid  Return to clinic in 2 weeks for follow-up to review adverse effects to alpelisib  No orders of the defined types were placed in this encounter.  The patient has a good understanding of the overall plan. she agrees with it. she will call with any problems that may develop before the next visit here.  Total time spent: 30 mins including face to face time and time spent for planning, charting and coordination of care  Nicholas Lose, MD 10/04/2020  I, Cloyde Reams Dorshimer, am acting as scribe for Dr. Nicholas Lose.  I have reviewed the above documentation for accuracy and completeness, and I agree with the above.    Addendum: We will add Faslodex to alpelisib. We will obtain hemoglobin A1c with the next set of labs.

## 2020-10-04 ENCOUNTER — Inpatient Hospital Stay: Payer: Medicare PPO | Attending: Hematology and Oncology | Admitting: Hematology and Oncology

## 2020-10-04 ENCOUNTER — Other Ambulatory Visit: Payer: Self-pay

## 2020-10-04 ENCOUNTER — Inpatient Hospital Stay: Payer: Medicare PPO

## 2020-10-04 ENCOUNTER — Telehealth: Payer: Self-pay | Admitting: Hematology and Oncology

## 2020-10-04 ENCOUNTER — Telehealth: Payer: Self-pay

## 2020-10-04 ENCOUNTER — Other Ambulatory Visit: Payer: Self-pay | Admitting: *Deleted

## 2020-10-04 DIAGNOSIS — Z9011 Acquired absence of right breast and nipple: Secondary | ICD-10-CM | POA: Insufficient documentation

## 2020-10-04 DIAGNOSIS — C50411 Malignant neoplasm of upper-outer quadrant of right female breast: Secondary | ICD-10-CM

## 2020-10-04 DIAGNOSIS — D0511 Intraductal carcinoma in situ of right breast: Secondary | ICD-10-CM | POA: Diagnosis not present

## 2020-10-04 DIAGNOSIS — Z170 Estrogen receptor positive status [ER+]: Secondary | ICD-10-CM

## 2020-10-04 DIAGNOSIS — Z79899 Other long term (current) drug therapy: Secondary | ICD-10-CM | POA: Diagnosis not present

## 2020-10-04 DIAGNOSIS — Z923 Personal history of irradiation: Secondary | ICD-10-CM | POA: Insufficient documentation

## 2020-10-04 MED ORDER — GOSERELIN ACETATE 3.6 MG ~~LOC~~ IMPL
DRUG_IMPLANT | SUBCUTANEOUS | Status: AC
  Filled 2020-10-04: qty 3.6

## 2020-10-04 MED ORDER — PIQRAY (300 MG DAILY DOSE) 2 X 150 MG PO TBPK: ORAL_TABLET | ORAL | 3 refills | Status: DC

## 2020-10-04 MED ORDER — LIDOCAINE-PRILOCAINE 2.5-2.5 % EX CREA: 1.0000 "application " | TOPICAL_CREAM | CUTANEOUS | 0 refills | Status: DC | PRN

## 2020-10-04 MED ORDER — GOSERELIN ACETATE 3.6 MG ~~LOC~~ IMPL
3.6000 mg | DRUG_IMPLANT | Freq: Once | SUBCUTANEOUS | Status: AC
  Administered 2020-10-04: 3.6 mg via SUBCUTANEOUS

## 2020-10-04 NOTE — Telephone Encounter (Signed)
Scheduled appts per 11/29 los. Gave pt a print out of AVS.  

## 2020-10-04 NOTE — Telephone Encounter (Signed)
Oral Oncology Patient Advocate Encounter  Received notification from [facility] that prior authorization for Misty Arnold is required.  PA submitted on CoverMyMeds Key [id number] Status is pending  Oral Oncology Clinic will continue to follow.  [place] Patient [place] Phone [phone] Fax [phone] 10/04/2020 3:58 PM

## 2020-10-05 ENCOUNTER — Telehealth: Payer: Self-pay | Admitting: Pharmacist

## 2020-10-05 NOTE — Addendum Note (Signed)
Addended by: Nicholas Lose on: 10/05/2020 10:21 AM   Modules accepted: Orders

## 2020-10-05 NOTE — Telephone Encounter (Signed)
Oral Oncology Pharmacist Encounter  Received new prescription for Piqray (alpelisib) for the treatment of metastatic HR+, HER2-, PIK3CA+ breast cancer in conjunction with letrozole, fulvestrant, and goserelin, planned duration until disease progression or unacceptable toxicity.  Labs from 08/31/2020 assessed, CBC and CMP wnl. Note elevated CBG of 126 though this is likely not fasting due to time of day. No baseline HbA1c, MD has ordered this lab for the patient's next visit on 12/15. Prescription dose and frequency assessed and appropriate.   Current medication list in Epic reviewed, no DDIs with Piqray identified. Patient will also start OTC cetirizine for prevention of rash from [facility].   Prescription has been e-scribed to the [facility] for benefits analysis and approval.  Oral Oncology Clinic will continue to follow for insurance authorization, copayment issues, initial counseling, hyperglycemia management, and start date.  Eddie Candle, PharmD, BCPS PGY2 Hematology/Oncology Pharmacy Resident Oral Chemotherapy Navigation Clinic 10/05/2020 8:05 AM

## 2020-10-06 NOTE — Telephone Encounter (Signed)
Oral Oncology Patient Advocate Encounter  Prior Authorization for Misty Arnold has been approved.    PA# [id number] Effective dates: 11/06/19 through 11/04/21  Patients co-pay is $0  Oral Oncology Clinic will continue to follow.    [place] Patient [place] Phone [phone] Fax [phone] 10/06/2020 10:12 AM

## 2020-10-10 ENCOUNTER — Other Ambulatory Visit: Payer: Self-pay | Admitting: Pharmacist

## 2020-10-10 DIAGNOSIS — C50919 Malignant neoplasm of unspecified site of unspecified female breast: Secondary | ICD-10-CM

## 2020-10-10 MED ORDER — PIQRAY (300 MG DAILY DOSE) 2 X 150 MG PO TBPK: ORAL_TABLET | ORAL | 3 refills | Status: DC

## 2020-10-10 MED FILL — PIQRAY 300MG DAILY DOSE 2X1: 2 X 150 | 28 days supply | Qty: 56 | Fill #0

## 2020-10-10 NOTE — Progress Notes (Signed)
Oral Oncology Pharmacist Encounter  Prescription quantity for Piqray (alpelisib) updated from quantity of 60 to quantity of 56 tablets (28 day supply) due to package size of [place].   Prescription redirected to [facility] for filling.  Leron Croak, PharmD, BCPS Hematology/Oncology Clinical Pharmacist [facility] [phone] 10/10/2020 12:46 PM

## 2020-10-11 NOTE — Telephone Encounter (Signed)
Oral Chemotherapy Pharmacist Encounter   Attempted to reach patient to provide update and offer for initial counseling on oral medication: Piqray (alpelisib).   Was unable to get a hold of patient. Left voicemail for patient to call back to discuss initial counseling session. Oral chemo education sheet and oral chem clinic business card mailed to patient's current address on file.    Eddie Candle, PharmD, BCPS PGY2 Hematology/Oncology Pharmacy Resident Oral Chemotherapy Navigation [facility] 10/11/2020 10:24 AM

## 2020-10-17 ENCOUNTER — Other Ambulatory Visit: Payer: Self-pay | Admitting: Hematology and Oncology

## 2020-10-18 ENCOUNTER — Telehealth: Payer: Self-pay

## 2020-10-18 NOTE — Telephone Encounter (Signed)
RN spoke with patient, patient requesting to cancel upcoming appointment due to not starting medication, Piqray.   RN inquired about medication, patient reports she is still doing, "her own research" prior to starting medication.  Pt agreed to reschedule follow up and will let us know if she decides to not start medication so we can discuss further treatment options.  RN offered resources for any questions related to [name], pt was thankful.

## 2020-10-19 ENCOUNTER — Inpatient Hospital Stay: Payer: Medicare PPO | Admitting: Hematology and Oncology

## 2020-10-19 ENCOUNTER — Inpatient Hospital Stay: Payer: Medicare PPO

## 2020-10-22 DIAGNOSIS — G8222 Paraplegia, incomplete: Secondary | ICD-10-CM | POA: Diagnosis not present

## 2020-10-26 DIAGNOSIS — C7951 Secondary malignant neoplasm of bone: Secondary | ICD-10-CM | POA: Diagnosis not present

## 2020-10-26 DIAGNOSIS — M4804 Spinal stenosis, thoracic region: Secondary | ICD-10-CM | POA: Diagnosis not present

## 2020-11-03 ENCOUNTER — Ambulatory Visit: Payer: Medicare PPO

## 2020-11-07 NOTE — Progress Notes (Incomplete)
Patient Care Team: Patient, [name] as PCP - General (General Practice)  DIAGNOSIS: No diagnosis found.  SUMMARY OF ONCOLOGIC HISTORY: Oncology History  Breast cancer of upper-outer quadrant of right female breast ([place])  09/21/2010 Mammogram   right breast linear and segmental pleomorphic calcifications from 4:00 to 6:00 position ultrasound revealed 1.6 cm and 1.1 cm masses   09/27/2010 Initial Biopsy   ultrasound-guided biopsy of all masses showed DCIS grade 2; patient went to cancer treatment centers of [place] for second opinion and delayed therapy   01/26/2011 Surgery   right mastectomy followed by reconstruction: 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 But she could not afford these trips.   01/08/2014 Breast MRI   right breast multiple enhancing masses within the soft tissues largest 5.6 cm in wall skin surface and the capsule of the silicone prosthesis, contiguous nodules involving in inferomedial breast and tired and subcentimeter nodules across the midline   08/29/2015 Surgery   Right mastectomy: IDC with invol of skin and skin ulceration, breast capsule inv cancer, superior medial margin positive, 1/1 LN positive, grade 3, 14.3 cm, 3.5 cm, ALI, chest wall involv, ER 90-100%, PR 80-90%, HER-2 negative, Ki 67 40-50% T4CN1 (St 3B)   03/05/2016 -  Anti-estrogen oral therapy   Tamoxifen 20 mg daily stopped due to headache and uncontrolled hypertension. Decrease to 10 mg daily 04/03/2016, stopped June 2017 and took an estrogen metabolizer over-the-counter;  started Aromasin April 2018 from a [place] physician, Zoladex started on 05/2017 and switched to Letrozole in 09/2017   01/04/2020 Surgery   Patient presented to the ED on 01/04/20 for worsening lower extremity numbness and underwent a decompressive laminectomy with tumor resection at T9 that was complicated with acute blood loss anemia and leukocytosis.   01/28/2020 - 02/15/2020 Radiation Therapy   Palliative radiation at site of thoracic tumor resection   03/03/2020 Miscellaneous    Ibrance with exemestane and Zoladex    Miscellaneous   Guardant 360: ESR 1 mutations, PI K3 CA mutation, EGFR mutation, T p53 mutation   10/11/2020 Miscellaneous   Alpelisib with letrozole and Zoladex     CHIEF COMPLIANT: Follow-up of metastatic breast cancer  INTERVAL HISTORY: Misty Arnold is a 50 y.o. with above-mentioned history of metastatic breast cancer currently on treatment with alpelisib with letrozole and Zoladex. She presentsto the clinic today for follow-up  ALLERGIES:  is allergic to amoxicillin, penicillins, pork-derived products, and tamoxifen.  MEDICATIONS:  Current Outpatient Medications  Medication Sig Dispense Refill  . acetaminophen (TYLENOL) 325 MG tablet Take 2 tablets (650 mg total) by mouth every 4 (four) hours as needed for mild pain ((score 1 to 3) or temp > 100.5). 30 tablet 0  . alpelisib (PIQRAY, 300 MG DAILY DOSE,) 2 x 150 MG Therapy Pack Take two 142m tablets with food at the same time daily. Swallow whole, do not crush, chew, or split. 56 each 3  . amLODipine (NORVASC) 10 MG tablet Take 1 tablet (10 mg total) by mouth daily. 90 tablet 3  . Ascorbic Acid (VITAMIN C PO) Take by mouth.    .Marland Kitchen  Bioflavonoid Products (ESTER C PO) Take 1 tablet by mouth with breakfast, with lunch, and with evening meal.     . bisacodyl (DULCOLAX) 10 MG suppository Place 1 suppository (10 mg total) rectally daily as needed for moderate constipation. 12 suppository 0  . BLACK COHOSH EXTRACT PO Take 1  tablet by mouth in the morning, at noon, and at bedtime.    Marland Kitchen BLACK CURRANT SEED OIL PO Take by mouth.    . calcium carbonate (OS-CAL - DOSED IN MG OF ELEMENTAL CALCIUM) 1250 (500 Ca) MG tablet Take 1 tablet (500 mg of elemental calcium total) by mouth 2 (two) times daily with a meal.    . Cholecalciferol (CVS D3) 125 MCG (5000 UT) capsule Take 1 capsule (5,000 Units total) by mouth 2 (two) times daily. 180 capsule 3  . diazepam (VALIUM) 5 MG tablet Take 1-2 tablets (5-10 mg total) by mouth every 6 (six) hours as needed for muscle spasms. 30 tablet 0  . Flaxseed Oil OIL Take 5 mLs by mouth in the morning, at noon, and at bedtime.     Marland Kitchen goserelin (ZOLADEX) 3.6 MG injection Inject 3.6 mg into the skin every 28 (twenty-eight) days.    Marland Kitchen letrozole (FEMARA) 2.5 MG tablet TAKE 1 TABLET BY MOUTH EVERY DAY 90 tablet 1  . lidocaine-prilocaine (EMLA) cream Apply 1 application topically as needed. 30 g 0  . Oxycodone HCl 10 MG TABS Take 1 tablet (10 mg total) by mouth every 3 (three) hours as needed ((score 7 to 10)). 180 tablet 0  . polyethylene glycol (MIRALAX / GLYCOLAX) 17 g packet Take 17 g by mouth daily as needed for mild constipation. 14 each 0  . sucralfate (CARAFATE) 1 g tablet Take 1 tablet (1 g total) by mouth 4 (four) times daily -  with meals and at bedtime. Crush and dissolve in 10 mL of warm water prior to swallowing 120 tablet 1   No current facility-administered medications for this visit.    PHYSICAL EXAMINATION: ECOG PERFORMANCE STATUS: {CHL ONC ECOG [phone]}  There were no vitals filed for this visit. There were no vitals filed for this visit.  LABORATORY DATA:  I have reviewed the data as listed CMP Latest Ref Rng & Units 08/31/2020 08/04/2020 06/30/2020  Glucose 70 - 99 mg/dL 126(H) 99 88  BUN 6 - 20 mg/dL _0 Creatinine 0.44 - 1.00 mg/dL 0.86 0.84 0.96  Sodium 135 - 145 mmol/L 144 142 143  Potassium 3.5 - 5.1 mmol/L 3.2(L) 3.6 3.2(L)  Chloride 98 - 111 mmol/L 107  105 106  CO2 22 - 32 mmol/L _1 Calcium 8.9 - 10.3 mg/dL 9.9 10.0 10.4(H)  Total Protein 6.5 - 8.1 g/dL 7.2 7.4 7.9  Total Bilirubin 0.3 - 1.2 mg/dL 0.3 0.5 0.5  Alkaline Phos 38 - 126 U/L 111 109 126  AST 15 - 41 U/L _2 ALT 0 - 44 U/L _3 Lab Results  Component Value Date   WBC 3.3 (L) 08/31/2020   HGB 12.6 08/31/2020   HCT 36.5 08/31/2020   MCV 95.8 08/31/2020   PLT 365 08/31/2020   NEUTROABS 2.2 08/31/2020    ASSESSMENT & PLAN:  No problem-specific Assessment & Plan notes found for this encounter.    No orders of the defined types were placed in this encounter.  The patient has a good understanding of the overall plan. she agrees with it. she will call with  any problems that may develop before the next visit here.  Total time spent: *** mins including face to face time and time spent for planning, charting and coordination of care  Nicholas Lose, MD 11/07/2020  I, Cloyde Reams Dorshimer, am acting as scribe for Dr. Nicholas Lose.  {insert scribe attestation}

## 2020-11-08 ENCOUNTER — Inpatient Hospital Stay: Payer: Medicare PPO | Admitting: Hematology and Oncology

## 2020-11-08 ENCOUNTER — Telehealth: Payer: Self-pay | Admitting: Hematology and Oncology

## 2020-11-08 ENCOUNTER — Inpatient Hospital Stay: Payer: Medicare PPO

## 2020-11-08 NOTE — Telephone Encounter (Signed)
Cancelled appointment per 1/4 schedule message. Patient will call back to reschedule. Patient is aware of changes.

## 2020-11-08 NOTE — Assessment & Plan Note (Deleted)
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  Treatment summary: 1.Right mastectomy 08/29/2015 at [place] (Dr.Singh): IDC with invol of skin and skin ulceration, breast capsule inv cancer, superior medial margin positive, 1/1 LN positive, grade 3, 14.3 cm, 3.5 cm, ALI, chest wall involv, ER 90-100%, PR 80-90%, HER-2 negative, Ki 67 40-50% T4CN1 (St 3B) 2.Patient started tamoxifen but developed severe headache with severe hypertension and tamoxifen was discontinued 03/17/2016. 3.02/2017 started on Exemestane, Zoladex started 05/29/2017 4.Hospitalization for paraplegia: T9 vertebral body compression status post decompressive laminectomy and tumor resection 01/04/2020 5.Palliative radiation to the thoracic spine 01/28/2020 6.  Ibrance with letrozole started April 2021-October 04, 2020 ----------------------------------------------------------------------------------------------------------------- Current treatment: Alpelisib with letrozole and Zoladex  started 10/05/2020 Alpelisib toxicities:  Wound care issues with the fungating tumor Return to clinic monthly for Zoladex injections.

## 2020-11-14 ENCOUNTER — Telehealth: Payer: Self-pay

## 2020-11-14 NOTE — Telephone Encounter (Signed)
Oral Oncology Patient Advocate Encounter   Was successful in securing patient a $6000 grant from [employer] to provide copayment coverage for [place].  This will keep the out of pocket expense at $0.      The billing information is as follows and has been shared with [place].   Member ID: [id number] Group ID: [id number] RxBin: [id number] PCN: [id number] Dates of Eligibility: 11/11/20 through 11/11/21  Fund name:  Metastatic Breast.   Welcome Patient [place] Phone [phone] Fax [phone] 11/14/2020 3:46 PM

## 2020-11-16 ENCOUNTER — Inpatient Hospital Stay: Payer: Medicare PPO | Attending: Hematology and Oncology

## 2020-11-16 ENCOUNTER — Other Ambulatory Visit: Payer: Self-pay

## 2020-11-16 ENCOUNTER — Other Ambulatory Visit: Payer: Self-pay | Admitting: *Deleted

## 2020-11-16 DIAGNOSIS — Z9011 Acquired absence of right breast and nipple: Secondary | ICD-10-CM | POA: Insufficient documentation

## 2020-11-16 DIAGNOSIS — C50411 Malignant neoplasm of upper-outer quadrant of right female breast: Secondary | ICD-10-CM

## 2020-11-16 DIAGNOSIS — Z170 Estrogen receptor positive status [ER+]: Secondary | ICD-10-CM | POA: Insufficient documentation

## 2020-11-16 DIAGNOSIS — D0511 Intraductal carcinoma in situ of right breast: Secondary | ICD-10-CM | POA: Diagnosis not present

## 2020-11-16 DIAGNOSIS — Z79899 Other long term (current) drug therapy: Secondary | ICD-10-CM | POA: Diagnosis not present

## 2020-11-16 DIAGNOSIS — Z923 Personal history of irradiation: Secondary | ICD-10-CM | POA: Insufficient documentation

## 2020-11-16 MED ORDER — GOSERELIN ACETATE 3.6 MG ~~LOC~~ IMPL
DRUG_IMPLANT | SUBCUTANEOUS | Status: AC
  Filled 2020-11-16: qty 3.6

## 2020-11-16 MED ORDER — GOSERELIN ACETATE 3.6 MG ~~LOC~~ IMPL
3.6000 mg | DRUG_IMPLANT | Freq: Once | SUBCUTANEOUS | Status: AC
Start: 2020-11-16 — End: 2020-11-16
  Administered 2020-11-16: 3.6 mg via SUBCUTANEOUS

## 2020-11-16 MED ORDER — AMLODIPINE BESYLATE 10 MG PO TABS: 10.0000 mg | ORAL_TABLET | Freq: Every day | ORAL | 3 refills | Status: DC

## 2020-11-16 NOTE — Progress Notes (Signed)
Per MD she will not receive Faslodex today. She will be seen in February 2022 for labs, MD visit and injection at that time. Pharmacy and patient made aware.

## 2020-11-16 NOTE — Patient Instructions (Signed)

## 2020-11-22 DIAGNOSIS — G8222 Paraplegia, incomplete: Secondary | ICD-10-CM | POA: Diagnosis not present

## 2020-11-24 ENCOUNTER — Telehealth: Payer: Self-pay | Admitting: *Deleted

## 2020-11-24 ENCOUNTER — Telehealth (HOSPITAL_BASED_OUTPATIENT_CLINIC_OR_DEPARTMENT_OTHER): Payer: Medicare PPO | Admitting: Hematology and Oncology

## 2020-11-24 DIAGNOSIS — C50919 Malignant neoplasm of unspecified site of unspecified female breast: Secondary | ICD-10-CM | POA: Diagnosis not present

## 2020-11-24 DIAGNOSIS — C50411 Malignant neoplasm of upper-outer quadrant of right female breast: Secondary | ICD-10-CM | POA: Diagnosis not present

## 2020-11-24 DIAGNOSIS — Z170 Estrogen receptor positive status [ER+]: Secondary | ICD-10-CM

## 2020-11-24 MED ORDER — SULFAMETHOXAZOLE-TRIMETHOPRIM 800-160 MG PO TABS: 1.0000 | ORAL_TABLET | ORAL | 3 refills | Status: DC

## 2020-11-24 NOTE — Progress Notes (Signed)
Telephone visit: Patient was unable to connect through [place] virtual visit and therefore we performed a telephone visit.   Patient Care Team: Patient, [name] as PCP - General (General Practice)  DIAGNOSIS:  Encounter Diagnoses  Name Primary?  . Metastatic breast cancer ([place]) Yes  . Malignant neoplasm of upper-outer quadrant of right breast in female, estrogen receptor positive ([place])     SUMMARY OF ONCOLOGIC HISTORY: Oncology History  Breast cancer of upper-outer quadrant of right female breast ([place])  09/21/2010 Mammogram   right breast linear and segmental pleomorphic calcifications from 4:00 to 6:00 position ultrasound revealed 1.6 cm and 1.1 cm masses   09/27/2010 Initial Biopsy   ultrasound-guided biopsy of all masses showed DCIS grade 2; patient went to cancer treatment centers of [place] for second opinion and delayed therapy   01/26/2011 Surgery   right mastectomy followed by reconstruction: 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 But she could not afford these trips.   01/08/2014 Breast MRI   right breast multiple enhancing masses within the soft tissues largest 5.6 cm in wall skin surface and the capsule of the silicone prosthesis, contiguous nodules involving in inferomedial breast and tired and subcentimeter nodules across the midline   08/29/2015 Surgery   Right mastectomy: IDC with invol of skin and skin ulceration, breast capsule inv cancer, superior medial margin positive, 1/1 LN positive, grade 3, 14.3 cm, 3.5 cm, ALI, chest wall involv,  ER 90-100%, PR 80-90%, HER-2 negative, Ki 67 40-50% T4CN1 (St 3B)   03/05/2016 -  Anti-estrogen oral therapy   Tamoxifen 20 mg daily stopped due to headache and uncontrolled hypertension. Decrease to 10 mg daily 04/03/2016, stopped June 2017 and took an estrogen metabolizer over-the-counter; started Aromasin April 2018 from a [place] physician, Zoladex started on 05/2017 and switched to Letrozole in 09/2017   01/04/2020 Surgery   Patient presented to the ED on 01/04/20 for worsening lower extremity numbness and underwent a decompressive laminectomy with tumor resection at T9 that was complicated with acute blood loss anemia and leukocytosis.   01/28/2020 - 02/15/2020 Radiation Therapy   Palliative radiation at site of thoracic tumor resection   03/03/2020 Miscellaneous    Ibrance with exemestane and Zoladex    Miscellaneous   Guardant 360: ESR 1 mutations, PI K3 CA mutation, EGFR mutation, T p53 mutation   10/11/2020 Miscellaneous   Alpelisib with letrozole and Zoladex     CHIEF COMPLIANT: Metastatic breast cancer, fungating tumor causing foul-smelling odor  INTERVAL HISTORY: Misty Arnold is a 50 year old with above-mentioned for metastatic breast cancer who is currently on alpelisib with letrozole and Zoladex.  She has had a fungating tumor in the chest wall and it is giving her a lot of problems with foul-smelling odor.  She dresses her wounds by herself.  She has refused to see wound care locally mainly because it takes a long drive to get to the wound care center.  She tells me that her family members are feeling uncomfortable with the smell.  She also tells me that it all started when she started using essential oil with cannabis.  She thinks that the tumor is dying and therefore is getting necrotic and foul-smelling.  She is losing weight.  She is seeing neurology regarding her issues related to brain metastases.   ALLERGIES:  is allergic to amoxicillin, penicillins, pork-derived products,  and tamoxifen.  MEDICATIONS:  Current Outpatient Medications  Medication Sig Dispense Refill  . acetaminophen (TYLENOL) 325 MG tablet Take 2 tablets (650 mg total) by mouth every 4 (four) hours as needed for mild pain ((score 1 to 3) or temp > 100.5). 30 tablet 0  . alpelisib (PIQRAY, 300 MG DAILY DOSE,) 2 x 150 MG Therapy Pack Take two 152m tablets with food at the same time daily. Swallow whole, do not crush, chew, or split. 56 each 3  . amLODipine (NORVASC) 10 MG tablet Take 1 tablet (10 mg total) by mouth daily. 90 tablet 3  . Ascorbic Acid (VITAMIN C PO) Take by mouth.    . Bioflavonoid Products (ESTER C PO) Take 1 tablet by mouth with breakfast, with lunch, and with evening meal.     . bisacodyl (DULCOLAX) 10 MG suppository Place 1 suppository (10 mg total) rectally daily as needed for moderate constipation. 12 suppository 0  . BLACK COHOSH EXTRACT PO Take 1 tablet by mouth in the morning, at noon, and at bedtime.    .Marland KitchenBLACK CURRANT SEED OIL PO Take by mouth.    . calcium carbonate (OS-CAL - DOSED IN MG OF ELEMENTAL CALCIUM) 1250 (500 Ca) MG tablet Take 1 tablet (500 mg of elemental calcium total) by mouth 2 (two) times daily with a meal.    . Cholecalciferol (CVS D3) 125 MCG (5000 UT) capsule Take 1 capsule (5,000 Units total) by mouth 2 (two) times daily. 180 capsule 3  . diazepam (VALIUM) 5 MG tablet Take 1-2 tablets (5-10 mg total) by mouth every 6 (six) hours as needed for muscle spasms. 30 tablet 0  . Flaxseed Oil OIL Take 5 mLs by mouth in the morning, at noon, and at bedtime.     .Marland Kitchengoserelin (ZOLADEX) 3.6 MG injection Inject 3.6 mg into the skin every 28 (twenty-eight) days.    .Marland Kitchenletrozole (FEMARA) 2.5 MG tablet TAKE 1 TABLET BY MOUTH EVERY DAY 90 tablet 1  . lidocaine-prilocaine (EMLA) cream Apply 1 application topically as needed. 30 g 0  . Oxycodone HCl 10 MG TABS Take 1 tablet (10 mg total) by mouth every 3 (three) hours as needed ((score 7 to 10)). 180 tablet 0  .  polyethylene glycol (MIRALAX / GLYCOLAX) 17 g packet Take 17 g by mouth daily as needed for mild constipation. 14 each 0  . sucralfate (CARAFATE) 1 g tablet Take 1 tablet (1 g total) by mouth 4 (four) times daily -  with meals and at bedtime. Crush and dissolve in 10 mL of warm water prior to swallowing 120 tablet 1   No current facility-administered medications for this visit.    PHYSICAL EXAMINATION: ECOG PERFORMANCE STATUS: 2 - Symptomatic, <50% confined to bed  There were no vitals filed for this visit. There were no vitals filed for this visit.     LABORATORY DATA:  I have reviewed the data as listed CMP Latest Ref Rng & Units 08/31/2020 08/04/2020 06/30/2020  Glucose 70 - 99 mg/dL 126(H) 99 88  BUN 6 - 20 mg/dL 16 13 13   Creatinine 0.44 - 1.00 mg/dL 0.86 0.84 0.96  Sodium 135 - 145 mmol/L 144 142 143  Potassium 3.5 - 5.1 mmol/L 3.2(L) 3.6 3.2(L)  Chloride 98 - 111 mmol/L 107 105 106  CO2 22 - 32 mmol/L 29 28 29   Calcium 8.9 - 10.3 mg/dL 9.9 10.0 10.4(H)  Total Protein 6.5 - 8.1 g/dL 7.2 7.4 7.9  Total Bilirubin 0.3 - 1.2 mg/dL 0.3 0.5 0.5  Alkaline Phos 38 - 126 U/L 111 109 126  AST 15 - 41 U/L 23 18 19   ALT 0 - 44 U/L 12 11 7     Lab Results  Component Value Date   WBC 3.3 (L) 08/31/2020   HGB 12.6 08/31/2020   HCT 36.5 08/31/2020   MCV 95.8 08/31/2020   PLT 365 08/31/2020   NEUTROABS 2.2 08/31/2020    ASSESSMENT & PLAN:  Breast cancer of upper-outer quadrant of right female breast ([place]) Recurrent right breast cancer initially DCIS treated with mastectomy followed by reconstruction and later developed chest wall recurrence in 2013 ER/PR positive HER-2 negative, patient underwent alternative therapies in [place] but could not afford the trips and hence she has not been on any breast cancer therapy for a long time.  Treatment summary: 1.Right mastectomy 08/29/2015 at [place] (Dr.Singh): IDC with invol of skin and skin ulceration, breast capsule inv cancer, superior  medial margin positive, 1/1 LN positive, grade 3, 14.3 cm, 3.5 cm, ALI, chest wall involv, ER 90-100%, PR 80-90%, HER-2 negative, Ki 67 40-50% T4CN1 (St 3B) 2.Patient started tamoxifen but developed severe headache with severe hypertension and tamoxifen was discontinued 03/17/2016. 3.02/2017 started on Exemestane, Zoladex started 05/29/2017 4.Hospitalization for paraplegia: T9 vertebral body compression status post decompressive laminectomy and tumor resection 01/04/2020 5.Palliative radiation to the thoracic spine 01/28/2020 6.  Ibrance with letrozole started April 2021-October 04, 2020 ----------------------------------------------------------------------------------------------------------------- Current treatment: Alpelisib with letrozole and Zoladex  started 10/05/2020 Alpelisib toxicities:  Wound care issues with the fungating tumor: Because of foul-smelling odor, I am recommending that she take Bactrim 3 times a week for prevention of infection.  History of paraplegia with a T9 vertebral compression fracture: Seeing orthopedics.  She had a recent MRI at the neurology office.  I have not seen the results.  Return to clinic monthly for Zoladex injections.    No orders of the defined types were placed in this encounter.  The patient has a good understanding of the overall plan. she agrees with it. she will call with any problems that may develop before the next visit here. Total time spent: 30 mins including face to face time and time spent for planning, charting and co-ordination of care   Harriette Ohara, MD 11/24/20

## 2020-11-24 NOTE — Telephone Encounter (Signed)
Received call from pt with complaint of foul odor coming from fungating chest wound x several weeks. Pt denies fever or purulent drainage.  Pt states she continues to preform self dressing changes daily with ABD pads and xeroform guaze.  Pt requesting to be seen by MD but is unable to find transportation. RN schedule mychart visit for pt to speak with MD today.

## 2020-11-24 NOTE — Assessment & Plan Note (Signed)
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  Treatment summary: 1.Right mastectomy 08/29/2015 at [place] (Dr.Singh): IDC with invol of skin and skin ulceration, breast capsule inv cancer, superior medial margin positive, 1/1 LN positive, grade 3, 14.3 cm, 3.5 cm, ALI, chest wall involv, ER 90-100%, PR 80-90%, HER-2 negative, Ki 67 40-50% T4CN1 (St 3B) 2.Patient started tamoxifen but developed severe headache with severe hypertension and tamoxifen was discontinued 03/17/2016. 3.02/2017 started on Exemestane, Zoladex started 05/29/2017 4.Hospitalization for paraplegia: T9 vertebral body compression status post decompressive laminectomy and tumor resection 01/04/2020 5.Palliative radiation to the thoracic spine 01/28/2020 6.  Ibrance with letrozole started April 2021-October 04, 2020 ----------------------------------------------------------------------------------------------------------------- Current treatment: Alpelisib with letrozole and Zoladex  started 10/05/2020 Alpelisib toxicities:  Wound care issues with the fungating tumor: Patient does not want to go to the wound care team that is near her house.  There are no other options that I can think of to help with the foul-smelling odor.  Return to clinic monthly for Zoladex injections.

## 2020-12-01 ENCOUNTER — Ambulatory Visit: Payer: Medicare PPO

## 2020-12-09 ENCOUNTER — Ambulatory Visit: Payer: Medicare PPO

## 2020-12-16 ENCOUNTER — Other Ambulatory Visit: Payer: Self-pay | Admitting: Hematology and Oncology

## 2020-12-16 ENCOUNTER — Inpatient Hospital Stay: Payer: Medicare PPO

## 2020-12-16 ENCOUNTER — Inpatient Hospital Stay: Payer: Medicare PPO | Admitting: Hematology and Oncology

## 2020-12-20 NOTE — Assessment & Plan Note (Deleted)
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  Treatment summary: 1.Right mastectomy 08/29/2015 at [place] (Dr.Singh): IDC with invol of skin and skin ulceration, breast capsule inv cancer, superior medial margin positive, 1/1 LN positive, grade 3, 14.3 cm, 3.5 cm, ALI, chest wall involv, ER 90-100%, PR 80-90%, HER-2 negative, Ki 67 40-50% T4CN1 (St 3B) 2.Patient started tamoxifen but developed severe headache with severe hypertension and tamoxifen was discontinued 03/17/2016. 3.02/2017 started on Exemestane, Zoladex started 05/29/2017 4.Hospitalization for paraplegia: T9 vertebral body compression status post decompressive laminectomy and tumor resection 01/04/2020 5.Palliative radiation to the thoracic spine 01/28/2020 6.Ibrance with letrozole started April 2021-October 04, 2020 ----------------------------------------------------------------------------------------------------------------- Current treatment:Alpelisib with letrozoleand Zoladex started 10/05/2020 Alpelisib toxicities:  Wound care issues with the fungating tumor: Because of foul-smelling odor, I am recommending that she take Bactrim 3 times a week for prevention of infection.  History of paraplegia with a T9 vertebral compression fracture: Seeing orthopedics.  She had a recent MRI at the neurology office.  I have not seen the results.  Return to clinic monthly for Zoladex injections.

## 2020-12-20 NOTE — Progress Notes (Incomplete)
HEMATOLOGY-ONCOLOGY MYCHART VIDEO VISIT PROGRESS NOTE  I connected with Misty Arnold on 12/20/2020 at  2:30 PM EST by MyChart video conference and verified that I am speaking with the correct person using two identifiers.  I discussed the limitations, risks, security and privacy concerns of performing an evaluation and management service by MyChart and the availability of in person appointments.  I also discussed with the patient that there may be a patient responsible charge related to this service. The patient expressed understanding and agreed to proceed.  Patient's Location: Home Physician Location: Clinic  CHIEF COMPLIANT: Follow-up of metastatic breast cancer  INTERVAL HISTORY: Misty Arnold is a 50 y.o. female with above-mentioned history of metastatic breast cancer who is currently on alpelisib with letrozole and Zoladex. She presents today for follow-up.   Oncology History  Breast cancer of upper-outer quadrant of right female breast ([place])  09/21/2010 Mammogram   right breast linear and segmental pleomorphic calcifications from 4:00 to 6:00 position ultrasound revealed 1.6 cm and 1.1 cm masses   09/27/2010 Initial Biopsy   ultrasound-guided biopsy of all masses showed DCIS grade 2; patient went to cancer treatment centers of [place] for second opinion and delayed therapy   01/26/2011 Surgery   right mastectomy followed by reconstruction: 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 But she could not afford these trips.   01/08/2014 Breast MRI   right  breast multiple enhancing masses within the soft tissues largest 5.6 cm in wall skin surface and the capsule of the silicone prosthesis, contiguous nodules involving in inferomedial breast and tired and subcentimeter nodules across the midline   08/29/2015 Surgery   Right mastectomy: IDC with invol of skin and skin ulceration, breast capsule inv cancer, superior medial margin positive, 1/1 LN positive, grade 3, 14.3 cm, 3.5 cm, ALI, chest wall involv, ER 90-100%, PR 80-90%, HER-2 negative, Ki 67 40-50% T4CN1 (St 3B)   03/05/2016 -  Anti-estrogen oral therapy   Tamoxifen 20 mg daily stopped due to headache and uncontrolled hypertension. Decrease to 10 mg daily 04/03/2016, stopped June 2017 and took an estrogen metabolizer over-the-counter; started Aromasin April 2018 from a [place] physician, Zoladex started on 05/2017 and switched to Letrozole in 09/2017   01/04/2020 Surgery   Patient presented to the ED on 01/04/20 for worsening lower extremity numbness and underwent a decompressive laminectomy with tumor resection at T9 that was complicated with acute blood loss anemia and leukocytosis.   01/28/2020 - 02/15/2020 Radiation Therapy   Palliative radiation at site of thoracic tumor resection   03/03/2020 Miscellaneous    Ibrance with exemestane and Zoladex    Miscellaneous   Guardant 360: ESR 1 mutations, PI K3 CA mutation, EGFR mutation, T p53 mutation   10/11/2020 Miscellaneous   Alpelisib with letrozole and Zoladex     Observations/Objective:  There were no vitals filed for this visit. There is no height or weight on file to calculate BMI.  I have reviewed the data as listed CMP Latest Ref Rng & Units 08/31/2020 08/04/2020 06/30/2020  Glucose 70 - 99 mg/dL 126(H) 99 88  BUN 6 - 20 mg/dL  16 13 13   Creatinine 0.44 - 1.00 mg/dL 0.86 0.84 0.96  Sodium 135 - 145 mmol/L 144 142 143  Potassium 3.5 - 5.1 mmol/L 3.2(L) 3.6 3.2(L)  Chloride 98 - 111 mmol/L 107 105 106  CO2 22 - 32 mmol/L 29 28 29    Calcium 8.9 - 10.3 mg/dL 9.9 10.0 10.4(H)  Total Protein 6.5 - 8.1 g/dL 7.2 7.4 7.9  Total Bilirubin 0.3 - 1.2 mg/dL 0.3 0.5 0.5  Alkaline Phos 38 - 126 U/L 111 109 126  AST 15 - 41 U/L 23 18 19   ALT 0 - 44 U/L 12 11 7     Lab Results  Component Value Date   WBC 3.3 (L) 08/31/2020   HGB 12.6 08/31/2020   HCT 36.5 08/31/2020   MCV 95.8 08/31/2020   PLT 365 08/31/2020   NEUTROABS 2.2 08/31/2020      Assessment Plan:  No problem-specific Assessment & Plan notes found for this encounter.    I discussed the assessment and treatment plan with the patient. The patient was provided an opportunity to ask questions and all were answered. The patient agreed with the plan and demonstrated an understanding of the instructions. The patient was advised to call back or seek an in-person evaluation if the symptoms worsen or if the condition fails to improve as anticipated.   Total time spent: *** minutes including face-to-face MyChart video visit time and time spent for planning, charting and coordination of care  Rulon Eisenmenger, MD 12/20/2020   I, Cloyde Reams Dorshimer, am acting as scribe for Nicholas Lose, MD.  {insert scribe attestation}

## 2020-12-21 ENCOUNTER — Telehealth: Payer: Self-pay | Admitting: *Deleted

## 2020-12-21 ENCOUNTER — Inpatient Hospital Stay: Payer: Medicare PPO

## 2020-12-21 ENCOUNTER — Inpatient Hospital Stay: Payer: Medicare PPO | Admitting: Hematology and Oncology

## 2020-12-21 DIAGNOSIS — C50919 Malignant neoplasm of unspecified site of unspecified female breast: Secondary | ICD-10-CM

## 2020-12-21 DIAGNOSIS — Z170 Estrogen receptor positive status [ER+]: Secondary | ICD-10-CM

## 2020-12-21 NOTE — Telephone Encounter (Signed)
Received VM from pt.  Attempt x1 to contact pt, no answer, LVM to return call to the office.

## 2020-12-21 NOTE — Progress Notes (Signed)
HEMATOLOGY-ONCOLOGY   PROGRESS NOTE     CHIEF COMPLIANT: Follow-up of metastatic breast cancer  INTERVAL HISTORY: Misty Arnold is a 50 y.o. female with above-mentioned history of metastatic breast cancer who is currently on alpelisib with letrozole and Zoladex. She presents today for follow-up.She continues to have bleeding from the chest wall wound.  She has noticed cutaneous metastases as well.  She is very reluctant to switch from [place] to [facility] because she is worried about elevated blood sugars.  And her entire family is diabetic.  Oncology History  Breast cancer of upper-outer quadrant of right female breast ([place])  09/21/2010 Mammogram   right breast linear and segmental pleomorphic calcifications from 4:00 to 6:00 position ultrasound revealed 1.6 cm and 1.1 cm masses   09/27/2010 Initial Biopsy   ultrasound-guided biopsy of all masses showed DCIS grade 2; patient went to cancer treatment centers of [place] for second opinion and delayed therapy   01/26/2011 Surgery   right mastectomy followed by reconstruction: 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 But she could not afford these trips.   01/08/2014 Breast MRI   right breast multiple enhancing masses within the soft tissues largest 5.6 cm in wall skin surface and the capsule of the silicone prosthesis, contiguous nodules involving in inferomedial breast and tired and subcentimeter nodules across the midline   08/29/2015 Surgery   Right mastectomy: IDC with invol of skin and skin ulceration, breast  capsule inv cancer, superior medial margin positive, 1/1 LN positive, grade 3, 14.3 cm, 3.5 cm, ALI, chest wall involv, ER 90-100%, PR 80-90%, HER-2 negative, Ki 67 40-50% T4CN1 (St 3B)   03/05/2016 -  Anti-estrogen oral therapy   Tamoxifen 20 mg daily stopped due to headache and uncontrolled hypertension. Decrease to 10 mg daily 04/03/2016, stopped June 2017 and took an estrogen metabolizer over-the-counter; started Aromasin April 2018 from a [place] physician, Zoladex started on 05/2017 and switched to Letrozole in 09/2017   01/04/2020 Surgery   Patient presented to the ED on 01/04/20 for worsening lower extremity numbness and underwent a decompressive laminectomy with tumor resection at T9 that was complicated with acute blood loss anemia and leukocytosis.   01/28/2020 - 02/15/2020 Radiation Therapy   Palliative radiation at site of thoracic tumor resection   03/03/2020 Miscellaneous    Ibrance with exemestane and Zoladex    Miscellaneous   Guardant 360: ESR 1 mutations, PI K3 CA mutation, EGFR mutation, T p53 mutation   10/11/2020 Miscellaneous   Alpelisib with letrozole and Zoladex     Observations/Objective:  Today's Vitals   12/22/20 1323  BP: (!) 143/90  Pulse: 100  Resp: 18  Temp: 97.7 F (36.5 C)  TempSrc: Temporal  SpO2: 100%  Weight: 113 lb 1.6 oz (51.3 kg)  Height: 5' 5"  (1.651 m)   Body mass index is 18.82 kg/m.  I have reviewed the data as listed CMP Latest Ref Rng & Units 12/22/2020 08/31/2020 08/04/2020  Glucose 70 - 99 mg/dL 93 126(H) 99  BUN 6 - 20 mg/dL 12 16 13   Creatinine 0.44 - 1.00 mg/dL 0.84 0.86 0.84  Sodium 135 -  145 mmol/L 141 144 142  Potassium 3.5 - 5.1 mmol/L 4.0 3.2(L) 3.6  Chloride 98 - 111 mmol/L 106 107 105  CO2 22 - 32 mmol/L 28 29 28   Calcium 8.9 - 10.3 mg/dL 9.7 9.9 10.0  Total Protein 6.5 - 8.1 g/dL 7.3 7.2 7.4  Total Bilirubin 0.3 - 1.2 mg/dL 0.3 0.3 0.5  Alkaline Phos 38 - 126 U/L 100 111 109  AST 15 - 41 U/L 25 23 18   ALT 0 - 44 U/L 13  12 11     Lab Results  Component Value Date   WBC 3.1 (L) 12/22/2020   HGB 11.6 (L) 12/22/2020   HCT 34.9 (L) 12/22/2020   MCV 90.9 12/22/2020   PLT 333 12/22/2020   NEUTROABS PENDING 12/22/2020      Assessment Plan:  Breast cancer of upper-outer quadrant of right female breast ([place]) 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  Treatment summary: 1.Right mastectomy 08/29/2015 at [facility] (Dr.Singh): IDC with invol of skin and skin ulceration, breast capsule inv cancer, superior medial margin positive, 1/1 LN positive, grade 3, 14.3 cm, 3.5 cm, ALI, chest wall involv, ER 90-100%, PR 80-90%, HER-2 negative, Ki 67 40-50% T4CN1 (St 3B) 2.Patient started tamoxifen but developed severe headache with severe hypertension and tamoxifen was discontinued 03/17/2016. 3.02/2017 started on Exemestane, Zoladex started 05/29/2017 4.Hospitalization for paraplegia: T9 vertebral body compression status post decompressive laminectomy and tumor resection 01/04/2020 5.Palliative radiation to the thoracic spine 01/28/2020 6.Ibrance with letrozole started April 2021-October 04, 2020 ----------------------------------------------------------------------------------------------------------------- Current treatment:Patient was prescribedAlpelisib but she doesn't want tot ake it because of the risk of high blood sugars. Shes staying on Ibrance with letrozoleand Zoladex  She wants to stay on Ibrance for 3 more months.  She thinks that the stress in her life is the cause of the treatment is not working.  She intends to take an apartment in [place] and moved so that she can get transportation with the [facility] health transportation  service.   Wound care issues with the fungating tumor:  Bactrim 3 times a week  History of paraplegia with a T9 vertebral compression fracture: Uses wheelchair for ambulation Return to clinic monthly for Zoladex injections.  Return to clinic  In 3 months for labs and follow-up.    I discussed the assessment and treatment plan with the patient. The patient was provided an opportunity to ask questions and all were answered. The patient agreed with the plan and demonstrated an understanding of the instructions. The patient was advised to call back or seek an in-person evaluation if the symptoms worsen or if the condition fails to improve as anticipated.   Total time spent: 30 minutes including face-to-face MyChart video visit time and time spent for planning, charting and coordination of care  Rulon Eisenmenger, MD 12/22/2020   I, Cloyde Reams Dorshimer, am acting as scribe for Nicholas Lose, MD.  I have reviewed the above documentation for accuracy and completeness, and I agree with the above.

## 2020-12-21 NOTE — Assessment & Plan Note (Signed)
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  Treatment summary: 1.Right mastectomy 08/29/2015 at [place] (Dr.Singh): IDC with invol of skin and skin ulceration, breast capsule inv cancer, superior medial margin positive, 1/1 LN positive, grade 3, 14.3 cm, 3.5 cm, ALI, chest wall involv, ER 90-100%, PR 80-90%, HER-2 negative, Ki 67 40-50% T4CN1 (St 3B) 2.Patient started tamoxifen but developed severe headache with severe hypertension and tamoxifen was discontinued 03/17/2016. 3.02/2017 started on Exemestane, Zoladex started 05/29/2017 4.Hospitalization for paraplegia: T9 vertebral body compression status post decompressive laminectomy and tumor resection 01/04/2020 5.Palliative radiation to the thoracic spine 01/28/2020 6.Ibrance with letrozole started April 2021-October 04, 2020 ----------------------------------------------------------------------------------------------------------------- Current treatment:Patient was prescribedAlpelisib but she doesn't want tot ake it because of high blood sugars. Shes staying on Ibrance with letrozoleand Zoladex started 10/05/2020   Wound care issues with the fungating tumor: Because of foul-smelling odor, I am recommending that she take Bactrim 3 times a week for prevention of infection.  History of paraplegia with a T9 vertebral compression fracture Return to clinic monthly for Zoladex injections.

## 2020-12-22 ENCOUNTER — Other Ambulatory Visit: Payer: Self-pay

## 2020-12-22 ENCOUNTER — Telehealth: Payer: Self-pay | Admitting: Pharmacist

## 2020-12-22 ENCOUNTER — Other Ambulatory Visit: Payer: Self-pay | Admitting: Hematology and Oncology

## 2020-12-22 ENCOUNTER — Other Ambulatory Visit: Payer: Self-pay | Admitting: *Deleted

## 2020-12-22 ENCOUNTER — Inpatient Hospital Stay: Payer: Medicare PPO

## 2020-12-22 ENCOUNTER — Inpatient Hospital Stay (HOSPITAL_BASED_OUTPATIENT_CLINIC_OR_DEPARTMENT_OTHER): Payer: Medicare PPO | Admitting: Hematology and Oncology

## 2020-12-22 ENCOUNTER — Inpatient Hospital Stay: Payer: Medicare PPO | Attending: Hematology and Oncology

## 2020-12-22 VITALS — BP 143/90 | HR 100 | Temp 97.7°F | Resp 18 | Ht 65.0 in | Wt 113.1 lb

## 2020-12-22 DIAGNOSIS — Z5111 Encounter for antineoplastic chemotherapy: Secondary | ICD-10-CM | POA: Insufficient documentation

## 2020-12-22 DIAGNOSIS — Z79899 Other long term (current) drug therapy: Secondary | ICD-10-CM | POA: Insufficient documentation

## 2020-12-22 DIAGNOSIS — C50411 Malignant neoplasm of upper-outer quadrant of right female breast: Secondary | ICD-10-CM | POA: Diagnosis not present

## 2020-12-22 DIAGNOSIS — C50919 Malignant neoplasm of unspecified site of unspecified female breast: Secondary | ICD-10-CM | POA: Diagnosis not present

## 2020-12-22 DIAGNOSIS — Z170 Estrogen receptor positive status [ER+]: Secondary | ICD-10-CM

## 2020-12-22 DIAGNOSIS — G8220 Paraplegia, unspecified: Secondary | ICD-10-CM | POA: Insufficient documentation

## 2020-12-22 DIAGNOSIS — C7951 Secondary malignant neoplasm of bone: Secondary | ICD-10-CM | POA: Diagnosis not present

## 2020-12-22 DIAGNOSIS — I10 Essential (primary) hypertension: Secondary | ICD-10-CM | POA: Diagnosis not present

## 2020-12-22 LAB — CMP (CANCER CENTER ONLY)
ALT: 13 U/L (ref 0–44)
AST: 25 U/L (ref 15–41)
Albumin: 3.9 g/dL (ref 3.5–5.0)
Alkaline Phosphatase: 100 U/L (ref 38–126)
Anion gap: 7 (ref 5–15)
BUN: 12 mg/dL (ref 6–20)
CO2: 28 mmol/L (ref 22–32)
Calcium: 9.7 mg/dL (ref 8.9–10.3)
Chloride: 106 mmol/L (ref 98–111)
Creatinine: 0.84 mg/dL (ref 0.44–1.00)
GFR, Estimated: >60 mL/min (ref >60)
Glucose, Bld: 93 mg/dL (ref 70–99)
Potassium: 4 mmol/L (ref 3.5–5.1)
Sodium: 141 mmol/L (ref 135–145)
Total Bilirubin: 0.3 mg/dL (ref 0.3–1.2)
Total Protein: 7.3 g/dL (ref 6.5–8.1)

## 2020-12-22 LAB — CBC WITH DIFFERENTIAL (CANCER CENTER ONLY)
Abs Immature Granulocytes: 0.02 10*3/uL (ref 0.00–0.07)
Basophils Absolute: 0 10*3/uL (ref 0.0–0.1)
Basophils Relative: 1 %
Eosinophils Absolute: 0 10*3/uL (ref 0.0–0.5)
Eosinophils Relative: 0 %
HCT: 34.9 % — ABNORMAL LOW (ref 36.0–46.0)
Hemoglobin: 11.6 g/dL — ABNORMAL LOW (ref 12.0–15.0)
Immature Granulocytes: 1 %
Lymphocytes Relative: 30 %
Lymphs Abs: 0.9 10*3/uL (ref 0.7–4.0)
MCH: 30.2 pg (ref 26.0–34.0)
MCHC: 33.2 g/dL (ref 30.0–36.0)
MCV: 90.9 fL (ref 80.0–100.0)
Monocytes Absolute: 0.2 10*3/uL (ref 0.1–1.0)
Monocytes Relative: 7 %
Neutro Abs: 1.9 10*3/uL (ref 1.7–7.7)
Neutrophils Relative %: 61 %
Platelet Count: 333 10*3/uL (ref 150–400)
RBC: 3.84 MIL/uL — ABNORMAL LOW (ref 3.87–5.11)
RDW: 13.5 % (ref 11.5–15.5)
WBC Count: 3.1 10*3/uL — ABNORMAL LOW (ref 4.0–10.5)
nRBC: 0 % (ref 0.0–0.2)

## 2020-12-22 LAB — HEMOGLOBIN A1C
Hgb A1c MFr Bld: 5.5 % (ref 4.8–5.6)
Mean Plasma Glucose: 111.15 mg/dL

## 2020-12-22 MED ORDER — GOSERELIN ACETATE 3.6 MG ~~LOC~~ IMPL
3.6000 mg | DRUG_IMPLANT | Freq: Once | SUBCUTANEOUS | Status: AC
  Administered 2020-12-22: 3.6 mg via SUBCUTANEOUS

## 2020-12-22 MED ORDER — LIDOCAINE-PRILOCAINE 2.5-2.5 % EX CREA: 1.0000 "application " | TOPICAL_CREAM | CUTANEOUS | 1 refills | Status: DC | PRN

## 2020-12-22 MED ORDER — GOSERELIN ACETATE 3.6 MG ~~LOC~~ IMPL
DRUG_IMPLANT | SUBCUTANEOUS | Status: AC
  Filled 2020-12-22: qty 3.6

## 2020-12-22 MED ORDER — PALBOCICLIB 125 MG PO TABS: 125.0000 mg | ORAL_TABLET | Freq: Every day | ORAL | 2 refills | Status: DC

## 2020-12-22 MED FILL — IBRANCE 125 MG TABS: 125 | 28 days supply | Qty: 21 | Fill #0

## 2020-12-22 MED FILL — LIDOCAINE-PRILOCAINE CREAM: 2.5-2.5 | 30 days supply | Qty: 30 | Fill #0

## 2020-12-22 NOTE — Telephone Encounter (Signed)
Oral Oncology Pharmacist Encounter  Notified patient will be resuming Ibrance (palbociclib) at this time in conjunction with letrozole and Zoladex for HR positive, HER-2 negative metastatic breast cancer.  Patient reinitiating on most recent dose of Ibrance 125 mg tablets, take 1 tablet (125 mg total) by mouth daily. Take for 21 days on, 7 days off, repeat every 28 days.     CMP and CBC w/ Diff from 12/22/20 assessed, no dose adjustments required at this time.  Current medication list in Epic reviewed, no relevant/significant DDIs with Ibrance identified.  Evaluated chart and no patient barriers to medication adherence noted.   Patient out of pocket cost for [facility] is $0. Medication is ready for patient to pick up at [facility].   Leron Croak, PharmD, BCPS Hematology/Oncology Clinical Pharmacist [facility] [phone] 12/22/2020 2:08 PM

## 2020-12-23 DIAGNOSIS — G8222 Paraplegia, incomplete: Secondary | ICD-10-CM | POA: Diagnosis not present

## 2020-12-26 ENCOUNTER — Telehealth: Payer: Self-pay | Admitting: Hematology and Oncology

## 2020-12-26 NOTE — Telephone Encounter (Signed)
Scheduled per 2/17 los. Called pt and left a msg

## 2020-12-30 ENCOUNTER — Ambulatory Visit: Payer: Medicare PPO

## 2021-01-02 ENCOUNTER — Ambulatory Visit: Payer: Medicare PPO

## 2021-01-02 ENCOUNTER — Other Ambulatory Visit: Payer: Self-pay | Admitting: *Deleted

## 2021-01-02 DIAGNOSIS — C50411 Malignant neoplasm of upper-outer quadrant of right female breast: Secondary | ICD-10-CM

## 2021-01-02 DIAGNOSIS — Z170 Estrogen receptor positive status [ER+]: Secondary | ICD-10-CM

## 2021-01-02 DIAGNOSIS — C7949 Secondary malignant neoplasm of other parts of nervous system: Secondary | ICD-10-CM

## 2021-01-02 NOTE — Progress Notes (Signed)
Received call from pt stating she has found a company with her insurance company for home health wound care management.  Pt requesting referral be sent to [facility] with [facility] in [place]. Per MD okay to send referral for home health.  Referral successfully faxed to number provided by pt [phone]).

## 2021-01-03 DIAGNOSIS — M8458XD Pathological fracture in neoplastic disease, other specified site, subsequent encounter for fracture with routine healing: Secondary | ICD-10-CM | POA: Diagnosis not present

## 2021-01-03 DIAGNOSIS — Z4800 Encounter for change or removal of nonsurgical wound dressing: Secondary | ICD-10-CM | POA: Diagnosis not present

## 2021-01-03 DIAGNOSIS — C7989 Secondary malignant neoplasm of other specified sites: Secondary | ICD-10-CM | POA: Diagnosis not present

## 2021-01-03 DIAGNOSIS — I10 Essential (primary) hypertension: Secondary | ICD-10-CM | POA: Diagnosis not present

## 2021-01-03 DIAGNOSIS — C50411 Malignant neoplasm of upper-outer quadrant of right female breast: Secondary | ICD-10-CM | POA: Diagnosis not present

## 2021-01-03 DIAGNOSIS — C7951 Secondary malignant neoplasm of bone: Secondary | ICD-10-CM | POA: Diagnosis not present

## 2021-01-03 DIAGNOSIS — Z170 Estrogen receptor positive status [ER+]: Secondary | ICD-10-CM | POA: Diagnosis not present

## 2021-01-06 ENCOUNTER — Ambulatory Visit: Payer: Medicare PPO

## 2021-01-10 DIAGNOSIS — C7989 Secondary malignant neoplasm of other specified sites: Secondary | ICD-10-CM | POA: Diagnosis not present

## 2021-01-10 DIAGNOSIS — Z4800 Encounter for change or removal of nonsurgical wound dressing: Secondary | ICD-10-CM | POA: Diagnosis not present

## 2021-01-10 DIAGNOSIS — I10 Essential (primary) hypertension: Secondary | ICD-10-CM | POA: Diagnosis not present

## 2021-01-10 DIAGNOSIS — C7951 Secondary malignant neoplasm of bone: Secondary | ICD-10-CM | POA: Diagnosis not present

## 2021-01-10 DIAGNOSIS — Z170 Estrogen receptor positive status [ER+]: Secondary | ICD-10-CM | POA: Diagnosis not present

## 2021-01-10 DIAGNOSIS — C50411 Malignant neoplasm of upper-outer quadrant of right female breast: Secondary | ICD-10-CM | POA: Diagnosis not present

## 2021-01-10 DIAGNOSIS — M8458XD Pathological fracture in neoplastic disease, other specified site, subsequent encounter for fracture with routine healing: Secondary | ICD-10-CM | POA: Diagnosis not present

## 2021-01-11 DIAGNOSIS — C7951 Secondary malignant neoplasm of bone: Secondary | ICD-10-CM | POA: Diagnosis not present

## 2021-01-11 DIAGNOSIS — Z4800 Encounter for change or removal of nonsurgical wound dressing: Secondary | ICD-10-CM | POA: Diagnosis not present

## 2021-01-11 DIAGNOSIS — Z170 Estrogen receptor positive status [ER+]: Secondary | ICD-10-CM | POA: Diagnosis not present

## 2021-01-11 DIAGNOSIS — M8458XD Pathological fracture in neoplastic disease, other specified site, subsequent encounter for fracture with routine healing: Secondary | ICD-10-CM | POA: Diagnosis not present

## 2021-01-11 DIAGNOSIS — I10 Essential (primary) hypertension: Secondary | ICD-10-CM | POA: Diagnosis not present

## 2021-01-11 DIAGNOSIS — C50411 Malignant neoplasm of upper-outer quadrant of right female breast: Secondary | ICD-10-CM | POA: Diagnosis not present

## 2021-01-11 DIAGNOSIS — C7989 Secondary malignant neoplasm of other specified sites: Secondary | ICD-10-CM | POA: Diagnosis not present

## 2021-01-12 DIAGNOSIS — Z170 Estrogen receptor positive status [ER+]: Secondary | ICD-10-CM | POA: Diagnosis not present

## 2021-01-12 DIAGNOSIS — C7989 Secondary malignant neoplasm of other specified sites: Secondary | ICD-10-CM | POA: Diagnosis not present

## 2021-01-12 DIAGNOSIS — M8458XD Pathological fracture in neoplastic disease, other specified site, subsequent encounter for fracture with routine healing: Secondary | ICD-10-CM | POA: Diagnosis not present

## 2021-01-12 DIAGNOSIS — Z4800 Encounter for change or removal of nonsurgical wound dressing: Secondary | ICD-10-CM | POA: Diagnosis not present

## 2021-01-12 DIAGNOSIS — C7951 Secondary malignant neoplasm of bone: Secondary | ICD-10-CM | POA: Diagnosis not present

## 2021-01-12 DIAGNOSIS — I10 Essential (primary) hypertension: Secondary | ICD-10-CM | POA: Diagnosis not present

## 2021-01-12 DIAGNOSIS — C50411 Malignant neoplasm of upper-outer quadrant of right female breast: Secondary | ICD-10-CM | POA: Diagnosis not present

## 2021-01-13 ENCOUNTER — Ambulatory Visit: Payer: Medicare PPO

## 2021-01-18 ENCOUNTER — Inpatient Hospital Stay: Payer: Medicare PPO

## 2021-01-20 DIAGNOSIS — G8222 Paraplegia, incomplete: Secondary | ICD-10-CM | POA: Diagnosis not present

## 2021-01-25 DIAGNOSIS — M8458XD Pathological fracture in neoplastic disease, other specified site, subsequent encounter for fracture with routine healing: Secondary | ICD-10-CM | POA: Diagnosis not present

## 2021-01-25 DIAGNOSIS — I10 Essential (primary) hypertension: Secondary | ICD-10-CM | POA: Diagnosis not present

## 2021-01-25 DIAGNOSIS — C50411 Malignant neoplasm of upper-outer quadrant of right female breast: Secondary | ICD-10-CM | POA: Diagnosis not present

## 2021-01-25 DIAGNOSIS — C7989 Secondary malignant neoplasm of other specified sites: Secondary | ICD-10-CM | POA: Diagnosis not present

## 2021-01-25 DIAGNOSIS — Z4800 Encounter for change or removal of nonsurgical wound dressing: Secondary | ICD-10-CM | POA: Diagnosis not present

## 2021-01-25 DIAGNOSIS — Z170 Estrogen receptor positive status [ER+]: Secondary | ICD-10-CM | POA: Diagnosis not present

## 2021-01-25 DIAGNOSIS — C7951 Secondary malignant neoplasm of bone: Secondary | ICD-10-CM | POA: Diagnosis not present

## 2021-01-26 ENCOUNTER — Telehealth: Payer: Self-pay | Admitting: Hematology and Oncology

## 2021-01-26 NOTE — Telephone Encounter (Signed)
Per sch msg, patient needs to r/s missed appt fro m3/16. Called and spoke with patient. Confirmed new date and time

## 2021-01-27 ENCOUNTER — Inpatient Hospital Stay: Payer: Medicare PPO | Attending: Hematology and Oncology

## 2021-01-27 ENCOUNTER — Other Ambulatory Visit: Payer: Self-pay

## 2021-01-27 ENCOUNTER — Inpatient Hospital Stay: Payer: Medicare PPO

## 2021-01-27 ENCOUNTER — Other Ambulatory Visit (HOSPITAL_COMMUNITY): Payer: Self-pay

## 2021-01-27 VITALS — BP 138/72 | HR 78 | Temp 98.2°F | Resp 18

## 2021-01-27 DIAGNOSIS — C50411 Malignant neoplasm of upper-outer quadrant of right female breast: Secondary | ICD-10-CM

## 2021-01-27 DIAGNOSIS — Z5111 Encounter for antineoplastic chemotherapy: Secondary | ICD-10-CM | POA: Insufficient documentation

## 2021-01-27 DIAGNOSIS — Z170 Estrogen receptor positive status [ER+]: Secondary | ICD-10-CM | POA: Diagnosis not present

## 2021-01-27 DIAGNOSIS — C7951 Secondary malignant neoplasm of bone: Secondary | ICD-10-CM | POA: Diagnosis not present

## 2021-01-27 LAB — CBC WITH DIFFERENTIAL (CANCER CENTER ONLY)
Abs Immature Granulocytes: 0.02 10*3/uL (ref 0.00–0.07)
Basophils Absolute: 0 10*3/uL (ref 0.0–0.1)
Basophils Relative: 1 %
Eosinophils Absolute: 0 10*3/uL (ref 0.0–0.5)
Eosinophils Relative: 0 %
HCT: 33.9 % — ABNORMAL LOW (ref 36.0–46.0)
Hemoglobin: 11.1 g/dL — ABNORMAL LOW (ref 12.0–15.0)
Immature Granulocytes: 1 %
Lymphocytes Relative: 20 %
Lymphs Abs: 0.8 10*3/uL (ref 0.7–4.0)
MCH: 29.7 pg (ref 26.0–34.0)
MCHC: 32.7 g/dL (ref 30.0–36.0)
MCV: 90.6 fL (ref 80.0–100.0)
Monocytes Absolute: 0.2 10*3/uL (ref 0.1–1.0)
Monocytes Relative: 6 %
Neutro Abs: 2.9 10*3/uL (ref 1.7–7.7)
Neutrophils Relative %: 72 %
Platelet Count: 272 10*3/uL (ref 150–400)
RBC: 3.74 MIL/uL — ABNORMAL LOW (ref 3.87–5.11)
RDW: 14.8 % (ref 11.5–15.5)
WBC Count: 4.1 10*3/uL (ref 4.0–10.5)
nRBC: 0 % (ref 0.0–0.2)

## 2021-01-27 LAB — CMP (CANCER CENTER ONLY)
ALT: 12 U/L (ref 0–44)
AST: 26 U/L (ref 15–41)
Albumin: 3.8 g/dL (ref 3.5–5.0)
Alkaline Phosphatase: 90 U/L (ref 38–126)
Anion gap: 7 (ref 5–15)
BUN: 21 mg/dL — ABNORMAL HIGH (ref 6–20)
CO2: 29 mmol/L (ref 22–32)
Calcium: 9.4 mg/dL (ref 8.9–10.3)
Chloride: 104 mmol/L (ref 98–111)
Creatinine: 0.88 mg/dL (ref 0.44–1.00)
GFR, Estimated: >60 mL/min (ref >60)
Glucose, Bld: 109 mg/dL — ABNORMAL HIGH (ref 70–99)
Potassium: 3.8 mmol/L (ref 3.5–5.1)
Sodium: 140 mmol/L (ref 135–145)
Total Bilirubin: 0.2 mg/dL — ABNORMAL LOW (ref 0.3–1.2)
Total Protein: 7.2 g/dL (ref 6.5–8.1)

## 2021-01-27 MED ORDER — GOSERELIN ACETATE 3.6 MG ~~LOC~~ IMPL
3.6000 mg | DRUG_IMPLANT | Freq: Once | SUBCUTANEOUS | Status: AC
  Administered 2021-01-27: 3.6 mg via SUBCUTANEOUS

## 2021-01-31 DIAGNOSIS — M8458XD Pathological fracture in neoplastic disease, other specified site, subsequent encounter for fracture with routine healing: Secondary | ICD-10-CM | POA: Diagnosis not present

## 2021-01-31 DIAGNOSIS — I10 Essential (primary) hypertension: Secondary | ICD-10-CM | POA: Diagnosis not present

## 2021-01-31 DIAGNOSIS — C7951 Secondary malignant neoplasm of bone: Secondary | ICD-10-CM | POA: Diagnosis not present

## 2021-01-31 DIAGNOSIS — C50411 Malignant neoplasm of upper-outer quadrant of right female breast: Secondary | ICD-10-CM | POA: Diagnosis not present

## 2021-01-31 DIAGNOSIS — Z4800 Encounter for change or removal of nonsurgical wound dressing: Secondary | ICD-10-CM | POA: Diagnosis not present

## 2021-01-31 DIAGNOSIS — C7989 Secondary malignant neoplasm of other specified sites: Secondary | ICD-10-CM | POA: Diagnosis not present

## 2021-01-31 DIAGNOSIS — Z170 Estrogen receptor positive status [ER+]: Secondary | ICD-10-CM | POA: Diagnosis not present

## 2021-02-07 DIAGNOSIS — C50411 Malignant neoplasm of upper-outer quadrant of right female breast: Secondary | ICD-10-CM | POA: Diagnosis not present

## 2021-02-07 DIAGNOSIS — I10 Essential (primary) hypertension: Secondary | ICD-10-CM | POA: Diagnosis not present

## 2021-02-07 DIAGNOSIS — C7951 Secondary malignant neoplasm of bone: Secondary | ICD-10-CM | POA: Diagnosis not present

## 2021-02-07 DIAGNOSIS — C7989 Secondary malignant neoplasm of other specified sites: Secondary | ICD-10-CM | POA: Diagnosis not present

## 2021-02-07 DIAGNOSIS — Z4800 Encounter for change or removal of nonsurgical wound dressing: Secondary | ICD-10-CM | POA: Diagnosis not present

## 2021-02-07 DIAGNOSIS — Z170 Estrogen receptor positive status [ER+]: Secondary | ICD-10-CM | POA: Diagnosis not present

## 2021-02-07 DIAGNOSIS — M8458XD Pathological fracture in neoplastic disease, other specified site, subsequent encounter for fracture with routine healing: Secondary | ICD-10-CM | POA: Diagnosis not present

## 2021-02-09 DIAGNOSIS — I10 Essential (primary) hypertension: Secondary | ICD-10-CM | POA: Diagnosis not present

## 2021-02-09 DIAGNOSIS — M8458XD Pathological fracture in neoplastic disease, other specified site, subsequent encounter for fracture with routine healing: Secondary | ICD-10-CM | POA: Diagnosis not present

## 2021-02-09 DIAGNOSIS — C7989 Secondary malignant neoplasm of other specified sites: Secondary | ICD-10-CM | POA: Diagnosis not present

## 2021-02-09 DIAGNOSIS — Z170 Estrogen receptor positive status [ER+]: Secondary | ICD-10-CM | POA: Diagnosis not present

## 2021-02-09 DIAGNOSIS — C7951 Secondary malignant neoplasm of bone: Secondary | ICD-10-CM | POA: Diagnosis not present

## 2021-02-09 DIAGNOSIS — C50411 Malignant neoplasm of upper-outer quadrant of right female breast: Secondary | ICD-10-CM | POA: Diagnosis not present

## 2021-02-09 DIAGNOSIS — Z4800 Encounter for change or removal of nonsurgical wound dressing: Secondary | ICD-10-CM | POA: Diagnosis not present

## 2021-02-14 DIAGNOSIS — C50411 Malignant neoplasm of upper-outer quadrant of right female breast: Secondary | ICD-10-CM | POA: Diagnosis not present

## 2021-02-14 DIAGNOSIS — Z170 Estrogen receptor positive status [ER+]: Secondary | ICD-10-CM | POA: Diagnosis not present

## 2021-02-14 DIAGNOSIS — C7951 Secondary malignant neoplasm of bone: Secondary | ICD-10-CM | POA: Diagnosis not present

## 2021-02-14 DIAGNOSIS — I10 Essential (primary) hypertension: Secondary | ICD-10-CM | POA: Diagnosis not present

## 2021-02-14 DIAGNOSIS — C7989 Secondary malignant neoplasm of other specified sites: Secondary | ICD-10-CM | POA: Diagnosis not present

## 2021-02-14 DIAGNOSIS — M8458XD Pathological fracture in neoplastic disease, other specified site, subsequent encounter for fracture with routine healing: Secondary | ICD-10-CM | POA: Diagnosis not present

## 2021-02-14 DIAGNOSIS — Z4800 Encounter for change or removal of nonsurgical wound dressing: Secondary | ICD-10-CM | POA: Diagnosis not present

## 2021-02-16 DIAGNOSIS — I10 Essential (primary) hypertension: Secondary | ICD-10-CM | POA: Diagnosis not present

## 2021-02-16 DIAGNOSIS — Z4800 Encounter for change or removal of nonsurgical wound dressing: Secondary | ICD-10-CM | POA: Diagnosis not present

## 2021-02-16 DIAGNOSIS — C7989 Secondary malignant neoplasm of other specified sites: Secondary | ICD-10-CM | POA: Diagnosis not present

## 2021-02-16 DIAGNOSIS — Z170 Estrogen receptor positive status [ER+]: Secondary | ICD-10-CM | POA: Diagnosis not present

## 2021-02-16 DIAGNOSIS — M8458XD Pathological fracture in neoplastic disease, other specified site, subsequent encounter for fracture with routine healing: Secondary | ICD-10-CM | POA: Diagnosis not present

## 2021-02-16 DIAGNOSIS — C7951 Secondary malignant neoplasm of bone: Secondary | ICD-10-CM | POA: Diagnosis not present

## 2021-02-16 DIAGNOSIS — C50411 Malignant neoplasm of upper-outer quadrant of right female breast: Secondary | ICD-10-CM | POA: Diagnosis not present

## 2021-02-22 ENCOUNTER — Other Ambulatory Visit (HOSPITAL_COMMUNITY): Payer: Self-pay

## 2021-02-22 DIAGNOSIS — Z4800 Encounter for change or removal of nonsurgical wound dressing: Secondary | ICD-10-CM | POA: Diagnosis not present

## 2021-02-22 DIAGNOSIS — I10 Essential (primary) hypertension: Secondary | ICD-10-CM | POA: Diagnosis not present

## 2021-02-22 DIAGNOSIS — M8458XD Pathological fracture in neoplastic disease, other specified site, subsequent encounter for fracture with routine healing: Secondary | ICD-10-CM | POA: Diagnosis not present

## 2021-02-22 DIAGNOSIS — Z170 Estrogen receptor positive status [ER+]: Secondary | ICD-10-CM | POA: Diagnosis not present

## 2021-02-22 DIAGNOSIS — C7989 Secondary malignant neoplasm of other specified sites: Secondary | ICD-10-CM | POA: Diagnosis not present

## 2021-02-22 DIAGNOSIS — C50411 Malignant neoplasm of upper-outer quadrant of right female breast: Secondary | ICD-10-CM | POA: Diagnosis not present

## 2021-02-22 DIAGNOSIS — C7951 Secondary malignant neoplasm of bone: Secondary | ICD-10-CM | POA: Diagnosis not present

## 2021-02-27 DIAGNOSIS — C7989 Secondary malignant neoplasm of other specified sites: Secondary | ICD-10-CM | POA: Diagnosis not present

## 2021-02-27 DIAGNOSIS — M8458XD Pathological fracture in neoplastic disease, other specified site, subsequent encounter for fracture with routine healing: Secondary | ICD-10-CM | POA: Diagnosis not present

## 2021-02-27 DIAGNOSIS — Z4800 Encounter for change or removal of nonsurgical wound dressing: Secondary | ICD-10-CM | POA: Diagnosis not present

## 2021-02-27 DIAGNOSIS — Z170 Estrogen receptor positive status [ER+]: Secondary | ICD-10-CM | POA: Diagnosis not present

## 2021-02-27 DIAGNOSIS — I10 Essential (primary) hypertension: Secondary | ICD-10-CM | POA: Diagnosis not present

## 2021-02-27 DIAGNOSIS — C7951 Secondary malignant neoplasm of bone: Secondary | ICD-10-CM | POA: Diagnosis not present

## 2021-02-27 DIAGNOSIS — C50411 Malignant neoplasm of upper-outer quadrant of right female breast: Secondary | ICD-10-CM | POA: Diagnosis not present

## 2021-03-02 ENCOUNTER — Other Ambulatory Visit (HOSPITAL_COMMUNITY): Payer: Self-pay

## 2021-03-02 ENCOUNTER — Other Ambulatory Visit: Payer: Self-pay | Admitting: *Deleted

## 2021-03-02 DIAGNOSIS — C50411 Malignant neoplasm of upper-outer quadrant of right female breast: Secondary | ICD-10-CM

## 2021-03-02 DIAGNOSIS — Z170 Estrogen receptor positive status [ER+]: Secondary | ICD-10-CM

## 2021-03-02 MED FILL — Palbociclib Tab 125 MG: ORAL | 28 days supply | Qty: 21 | Fill #0 | Status: AC

## 2021-03-07 ENCOUNTER — Other Ambulatory Visit (HOSPITAL_COMMUNITY): Payer: Self-pay

## 2021-03-07 ENCOUNTER — Telehealth: Payer: Self-pay

## 2021-03-07 NOTE — Telephone Encounter (Signed)
RN returned call regarding injection appointment.  Voicemail left for call back.

## 2021-03-10 DIAGNOSIS — M8458XD Pathological fracture in neoplastic disease, other specified site, subsequent encounter for fracture with routine healing: Secondary | ICD-10-CM | POA: Diagnosis not present

## 2021-03-10 DIAGNOSIS — C50411 Malignant neoplasm of upper-outer quadrant of right female breast: Secondary | ICD-10-CM | POA: Diagnosis not present

## 2021-03-10 DIAGNOSIS — C7989 Secondary malignant neoplasm of other specified sites: Secondary | ICD-10-CM | POA: Diagnosis not present

## 2021-03-10 DIAGNOSIS — C7951 Secondary malignant neoplasm of bone: Secondary | ICD-10-CM | POA: Diagnosis not present

## 2021-03-10 DIAGNOSIS — I10 Essential (primary) hypertension: Secondary | ICD-10-CM | POA: Diagnosis not present

## 2021-03-10 DIAGNOSIS — Z170 Estrogen receptor positive status [ER+]: Secondary | ICD-10-CM | POA: Diagnosis not present

## 2021-03-10 DIAGNOSIS — Z4800 Encounter for change or removal of nonsurgical wound dressing: Secondary | ICD-10-CM | POA: Diagnosis not present

## 2021-03-15 ENCOUNTER — Inpatient Hospital Stay: Payer: Medicare PPO

## 2021-03-15 ENCOUNTER — Ambulatory Visit (HOSPITAL_COMMUNITY): Payer: Medicare PPO

## 2021-03-16 ENCOUNTER — Ambulatory Visit (HOSPITAL_BASED_OUTPATIENT_CLINIC_OR_DEPARTMENT_OTHER)
Admission: RE | Admit: 2021-03-16 | Discharge: 2021-03-16 | Disposition: A | Discharge status: Home / Self Care | Payer: Medicare PPO | Source: Ambulatory Visit | Attending: Hematology and Oncology | Admitting: Hematology and Oncology

## 2021-03-16 ENCOUNTER — Other Ambulatory Visit: Payer: Medicare PPO

## 2021-03-16 ENCOUNTER — Other Ambulatory Visit: Payer: Self-pay

## 2021-03-16 ENCOUNTER — Inpatient Hospital Stay: Payer: Medicare PPO | Attending: Hematology and Oncology

## 2021-03-16 ENCOUNTER — Encounter (HOSPITAL_BASED_OUTPATIENT_CLINIC_OR_DEPARTMENT_OTHER): Payer: Self-pay

## 2021-03-16 DIAGNOSIS — R5383 Other fatigue: Secondary | ICD-10-CM | POA: Insufficient documentation

## 2021-03-16 DIAGNOSIS — C50919 Malignant neoplasm of unspecified site of unspecified female breast: Secondary | ICD-10-CM | POA: Diagnosis not present

## 2021-03-16 DIAGNOSIS — Z5111 Encounter for antineoplastic chemotherapy: Secondary | ICD-10-CM | POA: Insufficient documentation

## 2021-03-16 DIAGNOSIS — C50411 Malignant neoplasm of upper-outer quadrant of right female breast: Secondary | ICD-10-CM | POA: Diagnosis not present

## 2021-03-16 DIAGNOSIS — N8300 Follicular cyst of ovary, unspecified side: Secondary | ICD-10-CM | POA: Diagnosis not present

## 2021-03-16 DIAGNOSIS — Z79899 Other long term (current) drug therapy: Secondary | ICD-10-CM | POA: Diagnosis not present

## 2021-03-16 DIAGNOSIS — C779 Secondary and unspecified malignant neoplasm of lymph node, unspecified: Secondary | ICD-10-CM | POA: Diagnosis not present

## 2021-03-16 DIAGNOSIS — Z9011 Acquired absence of right breast and nipple: Secondary | ICD-10-CM | POA: Diagnosis not present

## 2021-03-16 DIAGNOSIS — Z170 Estrogen receptor positive status [ER+]: Secondary | ICD-10-CM

## 2021-03-16 DIAGNOSIS — R590 Localized enlarged lymph nodes: Secondary | ICD-10-CM | POA: Insufficient documentation

## 2021-03-16 DIAGNOSIS — I10 Essential (primary) hypertension: Secondary | ICD-10-CM | POA: Insufficient documentation

## 2021-03-16 DIAGNOSIS — G8220 Paraplegia, unspecified: Secondary | ICD-10-CM | POA: Diagnosis not present

## 2021-03-16 DIAGNOSIS — Z880 Allergy status to penicillin: Secondary | ICD-10-CM | POA: Insufficient documentation

## 2021-03-16 LAB — CMP (CANCER CENTER ONLY)
ALT: 12 U/L (ref 0–44)
AST: 28 U/L (ref 15–41)
Albumin: 4.4 g/dL (ref 3.5–5.0)
Alkaline Phosphatase: 90 U/L (ref 38–126)
Anion gap: 9 (ref 5–15)
BUN: 18 mg/dL (ref 6–20)
CO2: 28 mmol/L (ref 22–32)
Calcium: 9.6 mg/dL (ref 8.9–10.3)
Chloride: 103 mmol/L (ref 98–111)
Creatinine: 1 mg/dL (ref 0.44–1.00)
GFR, Estimated: >60 mL/min (ref >60)
Glucose, Bld: 94 mg/dL (ref 70–99)
Potassium: 4.2 mmol/L (ref 3.5–5.1)
Sodium: 140 mmol/L (ref 135–145)
Total Bilirubin: 0.3 mg/dL (ref 0.3–1.2)
Total Protein: 7.2 g/dL (ref 6.5–8.1)

## 2021-03-16 LAB — CBC WITH DIFFERENTIAL (CANCER CENTER ONLY)
Abs Immature Granulocytes: 0.01 10*3/uL (ref 0.00–0.07)
Basophils Absolute: 0 10*3/uL (ref 0.0–0.1)
Basophils Relative: 1 %
Eosinophils Absolute: 0 10*3/uL (ref 0.0–0.5)
Eosinophils Relative: 0 %
HCT: 36.3 % (ref 36.0–46.0)
Hemoglobin: 12 g/dL (ref 12.0–15.0)
Immature Granulocytes: 0 %
Lymphocytes Relative: 22 %
Lymphs Abs: 0.7 10*3/uL (ref 0.7–4.0)
MCH: 30.7 pg (ref 26.0–34.0)
MCHC: 33.1 g/dL (ref 30.0–36.0)
MCV: 92.8 fL (ref 80.0–100.0)
Monocytes Absolute: 0.3 10*3/uL (ref 0.1–1.0)
Monocytes Relative: 9 %
Neutro Abs: 2.1 10*3/uL (ref 1.7–7.7)
Neutrophils Relative %: 68 %
Platelet Count: 302 10*3/uL (ref 150–400)
RBC: 3.91 MIL/uL (ref 3.87–5.11)
RDW: 16.8 % — ABNORMAL HIGH (ref 11.5–15.5)
WBC Count: 3.2 10*3/uL — ABNORMAL LOW (ref 4.0–10.5)
nRBC: 0 % (ref 0.0–0.2)

## 2021-03-16 MED ORDER — IOHEXOL 300 MG/ML  SOLN
80.0000 mL | Freq: Once | INTRAMUSCULAR | Status: AC | PRN
  Administered 2021-03-16: 80 mL via INTRAVENOUS

## 2021-03-20 NOTE — Progress Notes (Signed)
Patient Care Team: Nicholas Lose, MD as PCP - General (Hematology and Oncology)  DIAGNOSIS:    ICD-10-CM   1. Malignant neoplasm of upper-outer quadrant of right breast in female, estrogen receptor positive ([place])  C50.411    Z17.0     SUMMARY OF ONCOLOGIC HISTORY: Oncology History  Breast cancer of upper-outer quadrant of right female breast ([place])  09/21/2010 Mammogram   right breast linear and segmental pleomorphic calcifications from 4:00 to 6:00 position ultrasound revealed 1.6 cm and 1.1 cm masses   09/27/2010 Initial Biopsy   ultrasound-guided biopsy of all masses showed DCIS grade 2; patient went to cancer treatment centers of [place] for second opinion and delayed therapy   01/26/2011 Surgery   right mastectomy followed by reconstruction: 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 But she could not afford these trips.   01/08/2014 Breast MRI   right breast multiple enhancing masses within the soft tissues largest 5.6 cm in wall skin surface and the capsule of the silicone prosthesis, contiguous nodules involving in inferomedial breast and tired and subcentimeter nodules across the midline   08/29/2015 Surgery   Right mastectomy: IDC with invol of skin and skin ulceration, breast capsule inv cancer, superior medial margin positive, 1/1 LN positive, grade 3, 14.3 cm, 3.5 cm, ALI, chest wall involv, ER 90-100%, PR 80-90%, HER-2 negative, Ki 67 40-50% T4CN1 (St 3B)   03/05/2016 -  Anti-estrogen oral therapy   Tamoxifen 20 mg daily stopped due to headache and  uncontrolled hypertension. Decrease to 10 mg daily 04/03/2016, stopped June 2017 and took an estrogen metabolizer over-the-counter; started Aromasin April 2018 from a [place] physician, Zoladex started on 05/2017 and switched to Letrozole in 09/2017   01/04/2020 Surgery   Patient presented to the ED on 01/04/20 for worsening lower extremity numbness and underwent a decompressive laminectomy with tumor resection at T9 that was complicated with acute blood loss anemia and leukocytosis.   01/28/2020 - 02/15/2020 Radiation Therapy   Palliative radiation at site of thoracic tumor resection   03/03/2020 Miscellaneous    Ibrance with exemestane and Zoladex    Miscellaneous   Guardant 360: ESR 1 mutations, PI K3 CA mutation, EGFR mutation, T p53 mutation   10/11/2020 Miscellaneous   Alpelisib with letrozole and Zoladex     CHIEF COMPLIANT: Follow-up of metastatic breast cancer  INTERVAL HISTORY: Misty Arnold is a 50 y.o. with above-mentioned history of metastatic breast cancer who is currently on alpelisib with letrozole and Zoladex. CT CAP on 03/16/21 showed enlarged right breast and axillary lymph node lesions and multiple enlarged osseous lesions with soft tissue components. She presents today for follow-up. Shes in a wheel chair and continues to suffer from the fungating tumor   ALLERGIES:  is allergic to amoxicillin, penicillins, pork-derived products, and tamoxifen.  MEDICATIONS:  Current Outpatient Medications  Medication Sig Dispense Refill  . acetaminophen (TYLENOL) 325 MG tablet Take 2 tablets (650 mg total) by mouth every 4 (four) hours as needed for mild pain ((score 1 to 3) or temp > 100.5). 30 tablet 0  . amLODipine (NORVASC) 10 MG tablet Take  1 tablet (10 mg total) by mouth daily. 90 tablet 3  . Ascorbic Acid (VITAMIN C PO) Take by mouth.    . Bioflavonoid Products (ESTER C PO) Take 1 tablet by mouth with breakfast, with lunch, and with evening meal.     . bisacodyl (DULCOLAX) 10  MG suppository Place 1 suppository (10 mg total) rectally daily as needed for moderate constipation. 12 suppository 0  . BLACK COHOSH EXTRACT PO Take 1 tablet by mouth in the morning, at noon, and at bedtime.    Marland Kitchen BLACK CURRANT SEED OIL PO Take by mouth.    . Calcium Carb-Cholecalciferol (OYSTER SHELL CALCIUM W/D) 500-200 MG-UNIT TABS TAKE 1 TABLET BY MOUTH TWICE A DAY WITH MEALS 60 tablet 2  . calcium carbonate (OS-CAL - DOSED IN MG OF ELEMENTAL CALCIUM) 1250 (500 Ca) MG tablet Take 1 tablet (500 mg of elemental calcium total) by mouth 2 (two) times daily with a meal.    . Cholecalciferol ([employer] D3) 125 MCG (5000 UT) capsule Take 1 capsule (5,000 Units total) by mouth 2 (two) times daily. 180 capsule 3  . dexamethasone (DECADRON) 1 MG tablet Take 1 tablet (1 mg total) by mouth daily for 5 days. 90 tablet 3  . diazepam (VALIUM) 5 MG tablet Take 1-2 tablets (5-10 mg total) by mouth every 6 (six) hours as needed for muscle spasms. 30 tablet 0  . Flaxseed Oil OIL Take 5 mLs by mouth in the morning, at noon, and at bedtime.     Marland Kitchen goserelin (ZOLADEX) 3.6 MG injection Inject 3.6 mg into the skin every 28 (twenty-eight) days.    Marland Kitchen letrozole (FEMARA) 2.5 MG tablet TAKE 1 TABLET BY MOUTH EVERY DAY 90 tablet 1  . lidocaine-prilocaine (EMLA) cream APPLY TOPICALLY AS NEEDED. 30 g 1  . Oxycodone HCl 10 MG TABS Take 1 tablet (10 mg total) by mouth every 3 (three) hours as needed ((score 7 to 10)). 180 tablet 0  . palbociclib (IBRANCE) 125 MG tablet TAKE 1 TABLET (125 MG TOTAL) BY MOUTH DAILY. TAKE FOR 21 DAYS ON, 7 DAYS OFF, REPEAT EVERY 28 DAYS. 21 tablet 2  . polyethylene glycol (MIRALAX / GLYCOLAX) 17 g packet Take 17 g by mouth daily as needed for mild constipation. 14 each 0  . sucralfate (CARAFATE) 1 g tablet Take 1 tablet (1 g total) by mouth 4 (four) times daily -  with meals and at bedtime. Crush and dissolve in 10 mL of warm water prior to swallowing 120 tablet 1  . sulfamethoxazole-trimethoprim (BACTRIM  DS) 800-160 MG tablet Take 1 tablet by mouth 3 (three) times a week. 36 tablet 3   No current facility-administered medications for this visit.    PHYSICAL EXAMINATION: ECOG PERFORMANCE STATUS: 2 - Symptomatic, <50% confined to bed  Vitals:   03/21/21 1353  BP: 134/79  Pulse: (!) 106  Resp: 18  Temp: 99.3 F (37.4 C)  SpO2: 100%    LABORATORY DATA:  I have reviewed the data as listed CMP Latest Ref Rng & Units 03/16/2021 01/27/2021 12/22/2020  Glucose 70 - 99 mg/dL 94 109(H) 93  BUN 6 - 20 mg/dL 18 21(H) 12  Creatinine 0.44 - 1.00 mg/dL 1.00 0.88 0.84  Sodium 135 - 145 mmol/L 140 140 141  Potassium 3.5 - 5.1 mmol/L 4.2 3.8 4.0  Chloride 98 - 111 mmol/L 103 104 106  CO2 22 - 32 mmol/L 28 29 28   Calcium 8.9 - 10.3 mg/dL 9.6 9.4 9.7  Total Protein 6.5 -  8.1 g/dL 7.2 7.2 7.3  Total Bilirubin 0.3 - 1.2 mg/dL 0.3 0.2(L) 0.3  Alkaline Phos 38 - 126 U/L 90 90 100  AST 15 - 41 U/L 28 26 25   ALT 0 - 44 U/L 12 12 13     Lab Results  Component Value Date   WBC 3.2 (L) 03/16/2021   HGB 12.0 03/16/2021   HCT 36.3 03/16/2021   MCV 92.8 03/16/2021   PLT 302 03/16/2021   NEUTROABS 2.1 03/16/2021    ASSESSMENT & PLAN:  Breast cancer of upper-outer quadrant of right female breast ([place]) Recurrent right breast cancer initially DCIS treated with mastectomy followed by reconstruction and later developed chest wall recurrence in 2013 ER/PR positive HER-2 negative, patient underwent alternative therapies in [place] but could not afford the trips and hence she has not been on any breast cancer therapy for a long time.  Treatment summary: 1.Right mastectomy 08/29/2015 at [facility] (Dr.Singh): IDC with invol of skin and skin ulceration, breast capsule inv cancer, superior medial margin positive, 1/1 LN positive, grade 3, 14.3 cm, 3.5 cm, ALI, chest wall involv, ER 90-100%, PR 80-90%, HER-2 negative, Ki 67 40-50% T4CN1 (St 3B) 2.Patient started tamoxifen but developed severe headache with severe  hypertension and tamoxifen was discontinued 03/17/2016. 3.02/2017 started on Exemestane, Zoladex started 05/29/2017 4.Hospitalization for paraplegia: T9 vertebral body compression status post decompressive laminectomy and tumor resection 01/04/2020 5.Palliative radiation to the thoracic spine 01/28/2020 6.Ibrance with letrozole started April 2021  ----------------------------------------------------------------------------------------------------------------- 03/17/2021: CT CAP: Redemonstrated bulky mass right breast with multiple metastatic lymph nodes and soft tissue lesions around the chest wall enlarged compared to before.  Multiple expansile mixed lytic and sclerotic bone lesions, worsening metastatic disease in the large lytic lesion posterior aspect of T8 and T9 vertebral bodies  Based on worsening scans I did not recommend continuation of Ibrance any further.  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 However she wants to continue with Ibrance for now.  Fatigue: start B 12 injections monthly RTC monthly for injections and q 3 month for MD visits   No orders of the defined types were placed in this encounter.  The patient has a good understanding of the overall plan. she agrees with it. she will call with any problems that may develop before the next visit here.  Total time spent: 30 mins including face to face time and time spent for planning, charting and coordination of care  Rulon Eisenmenger, MD, MPH 03/21/2021  I, Cloyde Reams Dorshimer, am acting as scribe for Dr. Nicholas Lose.  I have reviewed the above documentation for accuracy and completeness, and I agree with the above.

## 2021-03-21 ENCOUNTER — Other Ambulatory Visit: Payer: Self-pay

## 2021-03-21 ENCOUNTER — Other Ambulatory Visit (HOSPITAL_COMMUNITY): Payer: Self-pay

## 2021-03-21 ENCOUNTER — Inpatient Hospital Stay (HOSPITAL_BASED_OUTPATIENT_CLINIC_OR_DEPARTMENT_OTHER): Payer: Medicare PPO | Admitting: Hematology and Oncology

## 2021-03-21 ENCOUNTER — Inpatient Hospital Stay: Payer: Medicare PPO

## 2021-03-21 DIAGNOSIS — Z170 Estrogen receptor positive status [ER+]: Secondary | ICD-10-CM | POA: Diagnosis not present

## 2021-03-21 DIAGNOSIS — C779 Secondary and unspecified malignant neoplasm of lymph node, unspecified: Secondary | ICD-10-CM | POA: Diagnosis not present

## 2021-03-21 DIAGNOSIS — R5383 Other fatigue: Secondary | ICD-10-CM | POA: Diagnosis not present

## 2021-03-21 DIAGNOSIS — Z79899 Other long term (current) drug therapy: Secondary | ICD-10-CM | POA: Diagnosis not present

## 2021-03-21 DIAGNOSIS — C50411 Malignant neoplasm of upper-outer quadrant of right female breast: Secondary | ICD-10-CM | POA: Diagnosis not present

## 2021-03-21 DIAGNOSIS — Z5111 Encounter for antineoplastic chemotherapy: Secondary | ICD-10-CM | POA: Diagnosis not present

## 2021-03-21 DIAGNOSIS — R590 Localized enlarged lymph nodes: Secondary | ICD-10-CM | POA: Diagnosis not present

## 2021-03-21 DIAGNOSIS — G8220 Paraplegia, unspecified: Secondary | ICD-10-CM | POA: Diagnosis not present

## 2021-03-21 DIAGNOSIS — I10 Essential (primary) hypertension: Secondary | ICD-10-CM | POA: Diagnosis not present

## 2021-03-21 MED ORDER — CYANOCOBALAMIN 1000 MCG/ML IJ SOLN
1000.0000 ug | Freq: Once | INTRAMUSCULAR | Status: AC
  Administered 2021-03-21: 1000 ug via INTRAMUSCULAR

## 2021-03-21 MED ORDER — CYANOCOBALAMIN 1000 MCG/ML IJ SOLN
INTRAMUSCULAR | Status: AC
  Filled 2021-03-21: qty 1

## 2021-03-21 MED ORDER — GOSERELIN ACETATE 3.6 MG ~~LOC~~ IMPL
DRUG_IMPLANT | SUBCUTANEOUS | Status: AC
  Filled 2021-03-21: qty 3.6

## 2021-03-21 MED ORDER — DEXAMETHASONE 1 MG PO TABS: 1.0000 mg | ORAL_TABLET | Freq: Every day | ORAL | 3 refills | Status: AC

## 2021-03-21 MED ORDER — GOSERELIN ACETATE 3.6 MG ~~LOC~~ IMPL
3.6000 mg | DRUG_IMPLANT | Freq: Once | SUBCUTANEOUS | Status: AC
Start: 2021-03-21 — End: 2021-03-21
  Administered 2021-03-21: 3.6 mg via SUBCUTANEOUS

## 2021-03-21 NOTE — Assessment & Plan Note (Signed)
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  Treatment summary: 1.Right mastectomy 08/29/2015 at [facility] (Dr.Singh): IDC with invol of skin and skin ulceration, breast capsule inv cancer, superior medial margin positive, 1/1 LN positive, grade 3, 14.3 cm, 3.5 cm, ALI, chest wall involv, ER 90-100%, PR 80-90%, HER-2 negative, Ki 67 40-50% T4CN1 (St 3B) 2.Patient started tamoxifen but developed severe headache with severe hypertension and tamoxifen was discontinued 03/17/2016. 3.02/2017 started on Exemestane, Zoladex started 05/29/2017 4.Hospitalization for paraplegia: T9 vertebral body compression status post decompressive laminectomy and tumor resection 01/04/2020 5.Palliative radiation to the thoracic spine 01/28/2020 6.Ibrance with letrozole started April 2021  ----------------------------------------------------------------------------------------------------------------- 03/17/2021: CT CAP: Redemonstrated bulky mass right breast with multiple metastatic lymph nodes and soft tissue lesions around the chest wall enlarged compared to before.  Multiple expansile mixed lytic and sclerotic bone lesions, worsening metastatic disease in the large lytic lesion posterior aspect of T8 and T9 vertebral bodies  Based on worsening scans I did not recommend continuation of Ibrance any further.  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

## 2021-03-22 ENCOUNTER — Other Ambulatory Visit (HOSPITAL_COMMUNITY): Payer: Self-pay

## 2021-03-22 ENCOUNTER — Telehealth: Payer: Self-pay | Admitting: *Deleted

## 2021-03-22 NOTE — Telephone Encounter (Signed)
Received call from pt with complaint of irritation at injection site post Zoladex injection 03/21/21.  Pt states injection was extremely painful after administration.  Pt denies signes of infection such as redness, swelling, drainage or fever.  Per MD pt to apply ice pack to the site and call our office if s/s of infection occur.  Pt educated and verbalized understanding.

## 2021-03-23 ENCOUNTER — Other Ambulatory Visit: Payer: Self-pay | Admitting: Hematology and Oncology

## 2021-03-23 ENCOUNTER — Other Ambulatory Visit (HOSPITAL_COMMUNITY): Payer: Self-pay

## 2021-03-24 DIAGNOSIS — C50411 Malignant neoplasm of upper-outer quadrant of right female breast: Secondary | ICD-10-CM | POA: Diagnosis not present

## 2021-03-24 DIAGNOSIS — C7951 Secondary malignant neoplasm of bone: Secondary | ICD-10-CM | POA: Diagnosis not present

## 2021-03-24 DIAGNOSIS — Z4800 Encounter for change or removal of nonsurgical wound dressing: Secondary | ICD-10-CM | POA: Diagnosis not present

## 2021-03-24 DIAGNOSIS — Z170 Estrogen receptor positive status [ER+]: Secondary | ICD-10-CM | POA: Diagnosis not present

## 2021-03-24 DIAGNOSIS — M8458XD Pathological fracture in neoplastic disease, other specified site, subsequent encounter for fracture with routine healing: Secondary | ICD-10-CM | POA: Diagnosis not present

## 2021-03-24 DIAGNOSIS — C7989 Secondary malignant neoplasm of other specified sites: Secondary | ICD-10-CM | POA: Diagnosis not present

## 2021-03-24 DIAGNOSIS — I10 Essential (primary) hypertension: Secondary | ICD-10-CM | POA: Diagnosis not present

## 2021-03-28 DIAGNOSIS — Z170 Estrogen receptor positive status [ER+]: Secondary | ICD-10-CM | POA: Diagnosis not present

## 2021-03-28 DIAGNOSIS — Z4800 Encounter for change or removal of nonsurgical wound dressing: Secondary | ICD-10-CM | POA: Diagnosis not present

## 2021-03-28 DIAGNOSIS — C50411 Malignant neoplasm of upper-outer quadrant of right female breast: Secondary | ICD-10-CM | POA: Diagnosis not present

## 2021-03-28 DIAGNOSIS — I10 Essential (primary) hypertension: Secondary | ICD-10-CM | POA: Diagnosis not present

## 2021-03-28 DIAGNOSIS — C7951 Secondary malignant neoplasm of bone: Secondary | ICD-10-CM | POA: Diagnosis not present

## 2021-03-28 DIAGNOSIS — C7989 Secondary malignant neoplasm of other specified sites: Secondary | ICD-10-CM | POA: Diagnosis not present

## 2021-03-28 DIAGNOSIS — M8458XD Pathological fracture in neoplastic disease, other specified site, subsequent encounter for fracture with routine healing: Secondary | ICD-10-CM | POA: Diagnosis not present

## 2021-03-31 ENCOUNTER — Other Ambulatory Visit: Payer: Self-pay | Admitting: Hematology and Oncology

## 2021-03-31 ENCOUNTER — Other Ambulatory Visit (HOSPITAL_COMMUNITY): Payer: Self-pay

## 2021-03-31 DIAGNOSIS — C50919 Malignant neoplasm of unspecified site of unspecified female breast: Secondary | ICD-10-CM

## 2021-03-31 MED ORDER — PALBOCICLIB 125 MG PO TABS
ORAL_TABLET | ORAL | 2 refills | Status: DC
  Filled 2021-03-31: qty 21, 28d supply, fill #0
  Filled 2021-04-26: qty 21, 28d supply, fill #1
  Filled 2021-05-29: qty 21, 28d supply, fill #2

## 2021-04-04 ENCOUNTER — Other Ambulatory Visit (HOSPITAL_COMMUNITY): Payer: Self-pay

## 2021-04-05 DIAGNOSIS — C50411 Malignant neoplasm of upper-outer quadrant of right female breast: Secondary | ICD-10-CM | POA: Diagnosis not present

## 2021-04-05 DIAGNOSIS — C7989 Secondary malignant neoplasm of other specified sites: Secondary | ICD-10-CM | POA: Diagnosis not present

## 2021-04-05 DIAGNOSIS — C7951 Secondary malignant neoplasm of bone: Secondary | ICD-10-CM | POA: Diagnosis not present

## 2021-04-05 DIAGNOSIS — Z170 Estrogen receptor positive status [ER+]: Secondary | ICD-10-CM | POA: Diagnosis not present

## 2021-04-05 DIAGNOSIS — I10 Essential (primary) hypertension: Secondary | ICD-10-CM | POA: Diagnosis not present

## 2021-04-05 DIAGNOSIS — Z4800 Encounter for change or removal of nonsurgical wound dressing: Secondary | ICD-10-CM | POA: Diagnosis not present

## 2021-04-05 DIAGNOSIS — M8458XD Pathological fracture in neoplastic disease, other specified site, subsequent encounter for fracture with routine healing: Secondary | ICD-10-CM | POA: Diagnosis not present

## 2021-04-14 DIAGNOSIS — C7951 Secondary malignant neoplasm of bone: Secondary | ICD-10-CM | POA: Diagnosis not present

## 2021-04-14 DIAGNOSIS — Z170 Estrogen receptor positive status [ER+]: Secondary | ICD-10-CM | POA: Diagnosis not present

## 2021-04-14 DIAGNOSIS — C7989 Secondary malignant neoplasm of other specified sites: Secondary | ICD-10-CM | POA: Diagnosis not present

## 2021-04-14 DIAGNOSIS — I10 Essential (primary) hypertension: Secondary | ICD-10-CM | POA: Diagnosis not present

## 2021-04-14 DIAGNOSIS — Z4800 Encounter for change or removal of nonsurgical wound dressing: Secondary | ICD-10-CM | POA: Diagnosis not present

## 2021-04-14 DIAGNOSIS — M8458XD Pathological fracture in neoplastic disease, other specified site, subsequent encounter for fracture with routine healing: Secondary | ICD-10-CM | POA: Diagnosis not present

## 2021-04-14 DIAGNOSIS — C50411 Malignant neoplasm of upper-outer quadrant of right female breast: Secondary | ICD-10-CM | POA: Diagnosis not present

## 2021-04-18 DIAGNOSIS — M8458XD Pathological fracture in neoplastic disease, other specified site, subsequent encounter for fracture with routine healing: Secondary | ICD-10-CM | POA: Diagnosis not present

## 2021-04-18 DIAGNOSIS — C7951 Secondary malignant neoplasm of bone: Secondary | ICD-10-CM | POA: Diagnosis not present

## 2021-04-18 DIAGNOSIS — Z170 Estrogen receptor positive status [ER+]: Secondary | ICD-10-CM | POA: Diagnosis not present

## 2021-04-18 DIAGNOSIS — Z4800 Encounter for change or removal of nonsurgical wound dressing: Secondary | ICD-10-CM | POA: Diagnosis not present

## 2021-04-18 DIAGNOSIS — C50411 Malignant neoplasm of upper-outer quadrant of right female breast: Secondary | ICD-10-CM | POA: Diagnosis not present

## 2021-04-18 DIAGNOSIS — C7989 Secondary malignant neoplasm of other specified sites: Secondary | ICD-10-CM | POA: Diagnosis not present

## 2021-04-18 DIAGNOSIS — I10 Essential (primary) hypertension: Secondary | ICD-10-CM | POA: Diagnosis not present

## 2021-04-21 ENCOUNTER — Inpatient Hospital Stay: Payer: Medicare PPO | Attending: Hematology and Oncology

## 2021-04-21 ENCOUNTER — Other Ambulatory Visit (HOSPITAL_COMMUNITY): Payer: Self-pay

## 2021-04-21 ENCOUNTER — Other Ambulatory Visit: Payer: Self-pay

## 2021-04-21 DIAGNOSIS — C50411 Malignant neoplasm of upper-outer quadrant of right female breast: Secondary | ICD-10-CM | POA: Insufficient documentation

## 2021-04-21 DIAGNOSIS — R590 Localized enlarged lymph nodes: Secondary | ICD-10-CM | POA: Diagnosis not present

## 2021-04-21 DIAGNOSIS — Z79899 Other long term (current) drug therapy: Secondary | ICD-10-CM | POA: Diagnosis not present

## 2021-04-21 DIAGNOSIS — Z170 Estrogen receptor positive status [ER+]: Secondary | ICD-10-CM

## 2021-04-21 DIAGNOSIS — C779 Secondary and unspecified malignant neoplasm of lymph node, unspecified: Secondary | ICD-10-CM | POA: Insufficient documentation

## 2021-04-21 MED ORDER — GOSERELIN ACETATE 3.6 MG ~~LOC~~ IMPL
DRUG_IMPLANT | SUBCUTANEOUS | Status: AC
  Filled 2021-04-21: qty 3.6

## 2021-04-21 MED ORDER — CYANOCOBALAMIN 1000 MCG/ML IJ SOLN
1000.0000 ug | Freq: Once | INTRAMUSCULAR | Status: AC
  Administered 2021-04-21: 1000 ug via INTRAMUSCULAR

## 2021-04-21 MED ORDER — GOSERELIN ACETATE 3.6 MG ~~LOC~~ IMPL
3.6000 mg | DRUG_IMPLANT | Freq: Once | SUBCUTANEOUS | Status: AC
  Administered 2021-04-21: 3.6 mg via SUBCUTANEOUS

## 2021-04-26 ENCOUNTER — Other Ambulatory Visit (HOSPITAL_COMMUNITY): Payer: Self-pay

## 2021-04-26 DIAGNOSIS — C50411 Malignant neoplasm of upper-outer quadrant of right female breast: Secondary | ICD-10-CM | POA: Diagnosis not present

## 2021-04-26 DIAGNOSIS — Z4800 Encounter for change or removal of nonsurgical wound dressing: Secondary | ICD-10-CM | POA: Diagnosis not present

## 2021-04-26 DIAGNOSIS — I10 Essential (primary) hypertension: Secondary | ICD-10-CM | POA: Diagnosis not present

## 2021-04-26 DIAGNOSIS — C7951 Secondary malignant neoplasm of bone: Secondary | ICD-10-CM | POA: Diagnosis not present

## 2021-04-26 DIAGNOSIS — M8458XD Pathological fracture in neoplastic disease, other specified site, subsequent encounter for fracture with routine healing: Secondary | ICD-10-CM | POA: Diagnosis not present

## 2021-04-26 DIAGNOSIS — C7989 Secondary malignant neoplasm of other specified sites: Secondary | ICD-10-CM | POA: Diagnosis not present

## 2021-04-26 DIAGNOSIS — Z170 Estrogen receptor positive status [ER+]: Secondary | ICD-10-CM | POA: Diagnosis not present

## 2021-05-02 ENCOUNTER — Other Ambulatory Visit (HOSPITAL_COMMUNITY): Payer: Self-pay

## 2021-05-03 ENCOUNTER — Other Ambulatory Visit (HOSPITAL_COMMUNITY): Payer: Self-pay

## 2021-05-11 ENCOUNTER — Encounter (HOSPITAL_COMMUNITY): Payer: Self-pay

## 2021-05-11 ENCOUNTER — Inpatient Hospital Stay (HOSPITAL_COMMUNITY)
Admission: EM | Admit: 2021-05-11 | Discharge: 2021-05-13 | DRG: 683 | Disposition: A | Discharge status: Home / Self Care | Payer: Medicare PPO | Attending: Internal Medicine | Admitting: Internal Medicine

## 2021-05-11 ENCOUNTER — Emergency Department (HOSPITAL_COMMUNITY): Payer: Medicare PPO

## 2021-05-11 ENCOUNTER — Other Ambulatory Visit: Payer: Self-pay

## 2021-05-11 ENCOUNTER — Telehealth: Payer: Self-pay

## 2021-05-11 DIAGNOSIS — G893 Neoplasm related pain (acute) (chronic): Secondary | ICD-10-CM | POA: Diagnosis present

## 2021-05-11 DIAGNOSIS — C7951 Secondary malignant neoplasm of bone: Secondary | ICD-10-CM | POA: Diagnosis not present

## 2021-05-11 DIAGNOSIS — Z8249 Family history of ischemic heart disease and other diseases of the circulatory system: Secondary | ICD-10-CM | POA: Diagnosis not present

## 2021-05-11 DIAGNOSIS — Z79811 Long term (current) use of aromatase inhibitors: Secondary | ICD-10-CM | POA: Diagnosis not present

## 2021-05-11 DIAGNOSIS — Z800 Family history of malignant neoplasm of digestive organs: Secondary | ICD-10-CM | POA: Diagnosis not present

## 2021-05-11 DIAGNOSIS — R52 Pain, unspecified: Secondary | ICD-10-CM | POA: Diagnosis not present

## 2021-05-11 DIAGNOSIS — Z90710 Acquired absence of both cervix and uterus: Secondary | ICD-10-CM | POA: Diagnosis not present

## 2021-05-11 DIAGNOSIS — I10 Essential (primary) hypertension: Secondary | ICD-10-CM | POA: Diagnosis not present

## 2021-05-11 DIAGNOSIS — C50919 Malignant neoplasm of unspecified site of unspecified female breast: Secondary | ICD-10-CM | POA: Diagnosis not present

## 2021-05-11 DIAGNOSIS — F32A Depression, unspecified: Secondary | ICD-10-CM | POA: Diagnosis not present

## 2021-05-11 DIAGNOSIS — Z9011 Acquired absence of right breast and nipple: Secondary | ICD-10-CM | POA: Diagnosis not present

## 2021-05-11 DIAGNOSIS — R591 Generalized enlarged lymph nodes: Secondary | ICD-10-CM | POA: Diagnosis present

## 2021-05-11 DIAGNOSIS — Z833 Family history of diabetes mellitus: Secondary | ICD-10-CM | POA: Diagnosis not present

## 2021-05-11 DIAGNOSIS — D701 Agranulocytosis secondary to cancer chemotherapy: Secondary | ICD-10-CM | POA: Diagnosis present

## 2021-05-11 DIAGNOSIS — Z853 Personal history of malignant neoplasm of breast: Secondary | ICD-10-CM | POA: Diagnosis not present

## 2021-05-11 DIAGNOSIS — Z28310 Unvaccinated for covid-19: Secondary | ICD-10-CM

## 2021-05-11 DIAGNOSIS — L089 Local infection of the skin and subcutaneous tissue, unspecified: Secondary | ICD-10-CM

## 2021-05-11 DIAGNOSIS — R079 Chest pain, unspecified: Secondary | ICD-10-CM | POA: Diagnosis not present

## 2021-05-11 DIAGNOSIS — D6489 Other specified anemias: Secondary | ICD-10-CM | POA: Diagnosis present

## 2021-05-11 DIAGNOSIS — Z20822 Contact with and (suspected) exposure to covid-19: Secondary | ICD-10-CM | POA: Diagnosis present

## 2021-05-11 DIAGNOSIS — Z923 Personal history of irradiation: Secondary | ICD-10-CM | POA: Diagnosis not present

## 2021-05-11 DIAGNOSIS — T148XXA Other injury of unspecified body region, initial encounter: Secondary | ICD-10-CM

## 2021-05-11 DIAGNOSIS — Z880 Allergy status to penicillin: Secondary | ICD-10-CM | POA: Diagnosis not present

## 2021-05-11 DIAGNOSIS — Z170 Estrogen receptor positive status [ER+]: Secondary | ICD-10-CM

## 2021-05-11 DIAGNOSIS — N179 Acute kidney failure, unspecified: Secondary | ICD-10-CM | POA: Diagnosis not present

## 2021-05-11 DIAGNOSIS — R29898 Other symptoms and signs involving the musculoskeletal system: Secondary | ICD-10-CM

## 2021-05-11 DIAGNOSIS — Z803 Family history of malignant neoplasm of breast: Secondary | ICD-10-CM

## 2021-05-11 DIAGNOSIS — G8220 Paraplegia, unspecified: Secondary | ICD-10-CM | POA: Diagnosis present

## 2021-05-11 DIAGNOSIS — T451X5A Adverse effect of antineoplastic and immunosuppressive drugs, initial encounter: Secondary | ICD-10-CM | POA: Diagnosis present

## 2021-05-11 DIAGNOSIS — R0602 Shortness of breath: Secondary | ICD-10-CM | POA: Diagnosis not present

## 2021-05-11 DIAGNOSIS — X58XXXA Exposure to other specified factors, initial encounter: Secondary | ICD-10-CM | POA: Diagnosis present

## 2021-05-11 DIAGNOSIS — S21101A Unspecified open wound of right front wall of thorax without penetration into thoracic cavity, initial encounter: Secondary | ICD-10-CM | POA: Diagnosis not present

## 2021-05-11 DIAGNOSIS — R000 Tachycardia, unspecified: Secondary | ICD-10-CM | POA: Diagnosis not present

## 2021-05-11 DIAGNOSIS — Z5329 Procedure and treatment not carried out because of patient's decision for other reasons: Secondary | ICD-10-CM | POA: Diagnosis not present

## 2021-05-11 LAB — CBC WITH DIFFERENTIAL/PLATELET
Abs Immature Granulocytes: 0.01 10*3/uL (ref 0.00–0.07)
Basophils Absolute: 0.1 10*3/uL (ref 0.0–0.1)
Basophils Relative: 3 %
Eosinophils Absolute: 0.1 10*3/uL (ref 0.0–0.5)
Eosinophils Relative: 2 %
HCT: 37.4 % (ref 36.0–46.0)
Hemoglobin: 12.7 g/dL (ref 12.0–15.0)
Immature Granulocytes: 0 %
Lymphocytes Relative: 38 %
Lymphs Abs: 1 10*3/uL (ref 0.7–4.0)
MCH: 33 pg (ref 26.0–34.0)
MCHC: 34 g/dL (ref 30.0–36.0)
MCV: 97.1 fL (ref 80.0–100.0)
Monocytes Absolute: 0.2 10*3/uL (ref 0.1–1.0)
Monocytes Relative: 8 %
Neutro Abs: 1.3 10*3/uL — ABNORMAL LOW (ref 1.7–7.7)
Neutrophils Relative %: 49 %
Platelets: 350 10*3/uL (ref 150–400)
RBC: 3.85 MIL/uL — ABNORMAL LOW (ref 3.87–5.11)
RDW: 16.4 % — ABNORMAL HIGH (ref 11.5–15.5)
WBC: 2.6 10*3/uL — ABNORMAL LOW (ref 4.0–10.5)
nRBC: 0 % (ref 0.0–0.2)

## 2021-05-11 LAB — BASIC METABOLIC PANEL
Anion gap: 11 (ref 5–15)
BUN: 17 mg/dL (ref 6–20)
CO2: 27 mmol/L (ref 22–32)
Calcium: 9.4 mg/dL (ref 8.9–10.3)
Chloride: 104 mmol/L (ref 98–111)
Creatinine, Ser: 1.24 mg/dL — ABNORMAL HIGH (ref 0.44–1.00)
GFR, Estimated: 53 mL/min — ABNORMAL LOW (ref >60)
Glucose, Bld: 121 mg/dL — ABNORMAL HIGH (ref 70–99)
Potassium: 3.5 mmol/L (ref 3.5–5.1)
Sodium: 142 mmol/L (ref 135–145)

## 2021-05-11 LAB — MAGNESIUM: Magnesium: 2.4 mg/dL (ref 1.7–2.4)

## 2021-05-11 LAB — BRAIN NATRIURETIC PEPTIDE: B Natriuretic Peptide: 70.9 pg/mL (ref 0.0–100.0)

## 2021-05-11 LAB — CK: Total CK: 90 U/L (ref 38–234)

## 2021-05-11 LAB — PHOSPHORUS: Phosphorus: 4.1 mg/dL (ref 2.5–4.6)

## 2021-05-11 MED ORDER — MORPHINE SULFATE (PF) 4 MG/ML IV SOLN
4.0000 mg | Freq: Once | INTRAVENOUS | Status: AC
  Administered 2021-05-11: 4 mg via INTRAVENOUS
  Filled 2021-05-11: qty 1

## 2021-05-11 MED ORDER — SODIUM CHLORIDE 0.9 % IV BOLUS
1000.0000 mL | Freq: Once | INTRAVENOUS | Status: AC
  Administered 2021-05-11: 1000 mL via INTRAVENOUS

## 2021-05-11 MED ORDER — MORPHINE SULFATE (PF) 4 MG/ML IV SOLN
4.0000 mg | Freq: Once | INTRAVENOUS | Status: AC
Start: 2021-05-11 — End: 2021-05-11
  Administered 2021-05-11: 4 mg via INTRAVENOUS
  Filled 2021-05-11: qty 1

## 2021-05-11 MED ORDER — IOHEXOL 350 MG/ML SOLN
80.0000 mL | Freq: Once | INTRAVENOUS | Status: AC | PRN
  Administered 2021-05-11: 80 mL via INTRAVENOUS

## 2021-05-11 MED ORDER — ONDANSETRON HCL 4 MG/2ML IJ SOLN
4.0000 mg | Freq: Once | INTRAMUSCULAR | Status: AC
  Administered 2021-05-11: 4 mg via INTRAVENOUS
  Filled 2021-05-11: qty 2

## 2021-05-11 NOTE — Telephone Encounter (Signed)
Patient called regarding chest pain, muscle pain, and shortness of breath.   History of metastatic breast cancer.    Patient reports chest pain and shortness of breath that started this AM.  Not relieved with positional changes.  Patient states, "I just don't feel good or normal."  RN instructed patient to call 911.  Pt verbalized understanding and agreement.

## 2021-05-11 NOTE — H&P (Signed)
[name]:[id number] DOB: [birth year] DOA: 05/11/2021     PCP: Nicholas Lose, MD      Oncology  Dr. Lindi Adie    Patient arrived to ER on 05/11/21 at 2036 Referred by Attending Isla Pence, MD   Patient coming from: home Lives  With family    Chief Complaint:   Chief Complaint  Patient presents with   Wound Infection    HPI: Misty Arnold is a 50 y.o. female with medical history significant of  breast ca diagnosed in 2011, paraplegic ambulates with cane, hypertension    Presented with chest pain shortness of breath started this morning.  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 Currently taking oral chemotherapy. She has known fungating tumor of her right chest denies any fever but have had worsening pain there and in her back that is what brought her to emergency department.  Patient currently resides in [place] but has been traveling to to see her oncologist in [place] been vaccinated against [place]     Initial COVID TEST  in house  PCR testing  Pending  Lab Results  Component Value Date   [place] 01/04/2020     Regarding pertinent Chronic problems:   Breast cancer on Femara pain control oxycodone   HT    Chronic anemia - baseline hg Hemoglobin & Hematocrit  Recent Labs    01/27/21 1513 03/16/21 1334 05/11/21 2124  HGB 11.1* 12.0 12.7     While in ER: CTA negative for PE given IV fluids and morphine for pain Noted to have a mild AKI Tachycardic up to 123   ED Triage Vitals  Enc Vitals Group     BP 05/11/21 2055 (!) 159/101     Pulse Rate 05/11/21 2055 (!) 123     Resp 05/11/21 2055 14     Temp 05/11/21 2055 99.7 F (37.6 C)     Temp Source 05/11/21 2055 Oral     SpO2 05/11/21 2055 100 %     Weight 05/11/21 2055 122 lb 4 oz (55.5 kg)     Height 05/11/21 2055 5\' 4"  (1.626 m)     Head Circumference --      Peak Flow --      Pain Score 05/11/21 2120 7     Pain Loc --      Pain  Edu? --      Excl. in [place]? --   TMAX(24)@     _________________________________________ Significant initial  Findings: Abnormal Labs Reviewed  BASIC METABOLIC PANEL - Abnormal; Notable for the following components:      Result Value   Glucose, Bld 121 (*)    Creatinine, Ser 1.24 (*)    GFR, Estimated 53 (*)    All other components within normal limits  CBC WITH DIFFERENTIAL/PLATELET - Abnormal; Notable for the following components:   WBC 2.6 (*)    RBC 3.85 (*)    RDW 16.4 (*)    Neutro Abs 1.3 (*)    All other components within normal limits   ____________________________________________ Ordered     CTA chest -  non acute, no PE, no evidence of infiltrate ,known breast mets   _________________________   ECG: Ordered Personally reviewed by me showing: HR : 105 Rhythm:   Sinus tachycardia    no evidence of ischemic changes QTC 438   The recent clinical data is shown below. Vitals:   05/11/21 2055 05/11/21  2200  BP: (!) 159/101 (!) 142/97  Pulse: (!) 123 (!) 101  Resp: 14 (!) 28  Temp: 99.7 F (37.6 C)   TempSrc: Oral   SpO2: 100% 98%  Weight: 55.5 kg   Height: 5\' 4"  (1.626 m)       WBC     Component Value Date/Time   WBC 2.6 (L) 05/11/2021 2124   LYMPHSABS 1.0 05/11/2021 2124   LYMPHSABS 2.5 11/01/2017 1417   MONOABS 0.2 05/11/2021 2124   MONOABS 0.7 11/01/2017 1417   EOSABS 0.1 05/11/2021 2124   EOSABS 0.1 11/01/2017 1417   BASOSABS 0.1 05/11/2021 2124   BASOSABS 0.0 11/01/2017 1417     Lactic Acid, Venous    Component Value Date/Time   LATICACIDVEN 0.8 05/11/2021 2322       UA   no evidence of UTI     Urine analysis:    Component Value Date/Time   COLORURINE YELLOW 05/11/2021 2323   APPEARANCEUR CLEAR 05/11/2021 2323   LABSPEC 1.020 05/11/2021 2323   PHURINE 5.0 05/11/2021 2323   GLUCOSEU NEGATIVE 05/11/2021 2323   HGBUR NEGATIVE 05/11/2021 2323   BILIRUBINUR NEGATIVE 05/11/2021 2323   KETONESUR 5 (A) 05/11/2021 2323   PROTEINUR  NEGATIVE 05/11/2021 2323   NITRITE NEGATIVE 05/11/2021 2323   LEUKOCYTESUR NEGATIVE 05/11/2021 2323    Results for orders placed or performed during the hospital encounter of 01/08/20  Culture, Urine     Status: Abnormal   Collection Time: 01/14/20 10:59 AM   Specimen: Urine, Random  Result Value Ref Range Status   Specimen Description URINE, RANDOM  Final   Special Requests   Final    NONE Performed at [facility], [address], [place], [place] [address]    Culture MULTIPLE SPECIES PRESENT, SUGGEST RECOLLECTION (A)  Final   Report Status 01/15/2020 FINAL  Final     ________________________________________ Hospitalist was called for admission for cancer pain control, AKI   The following Work up has been ordered so far:  Orders Placed This Encounter  Procedures   Resp Panel by RT-PCR (Flu A&B, Covid) Nasopharyngeal Swab   CT Angio Chest PE W and/or Wo Contrast   Basic metabolic panel   Brain natriuretic peptide   CBC with Differential   Cardiac monitoring   Consult to hospitalist   Pulse oximetry (single)   ED EKG   EKG 12-Lead   Insert peripheral IV     Following Medications were ordered in ER: Medications  morphine 4 MG/ML injection 4 mg (has no administration in time range)  morphine 4 MG/ML injection 4 mg (4 mg Intravenous Given 05/11/21 2154)  ondansetron (ZOFRAN) injection 4 mg (4 mg Intravenous Given 05/11/21 2151)  sodium chloride 0.9 % bolus 1,000 mL (1,000 mLs Intravenous Bolus from Bag 05/11/21 2155)  iohexol (OMNIPAQUE) 350 MG/ML injection 80 mL (80 mLs Intravenous Contrast Given 05/11/21 2228)        Consult Orders  (From admission, onward)           Start     Ordered   05/11/21 2258  Consult to hospitalist  Once       Provider:  (Not yet assigned)  Question Answer Comment  Place call to: [facility]   Reason for Consult Admit      05/11/21 2257             OTHER Significant initial  Findings:  labs showing:    Recent  Labs  Lab 05/11/21 2124  NA  142  K 3.5  CO2 27  GLUCOSE 121*  BUN 17  CREATININE 1.24*  CALCIUM 9.4    Cr  Up from baseline see below Lab Results  Component Value Date   CREATININE 1.24 (H) 05/11/2021   CREATININE 1.00 03/16/2021   CREATININE 0.88 01/27/2021    No results for input(s): AST, ALT, ALKPHOS, BILITOT, PROT, ALBUMIN in the last 168 hours. Lab Results  Component Value Date   CALCIUM 9.4 05/11/2021          Plt: Lab Results  Component Value Date   PLT 350 05/11/2021    Recent Labs  Lab 05/11/21 2124  WBC 2.6*  NEUTROABS 1.3*  HGB 12.7  HCT 37.4  MCV 97.1  PLT 350    HG/HCT * stable,  Down *Up from baseline see below    Component Value Date/Time   HGB 12.7 05/11/2021 2124   HGB 12.0 03/16/2021 1334   HGB 13.1 11/01/2017 1417   HCT 37.4 05/11/2021 2124   HCT 40.1 11/01/2017 1417   MCV 97.1 05/11/2021 2124   MCV 89.3 11/01/2017 1417      No results for input(s): LIPASE, AMYLASE in the last 168 hours. No results for input(s): AMMONIA in the last 168 hours.   Cardiac Panel (last 3 results) No results for input(s): CKTOTAL, CKMB, TROPONINI, RELINDX in the last 72 hours.   BNP (last 3 results) Recent Labs    05/11/21 2124  BNP 70.9      DM  labs:  HbA1C: Recent Labs    12/22/20 1306  HGBA1C 5.5      CBG (last 3)  No results for input(s): GLUCAP in the last 72 hours.        Cultures:    Component Value Date/Time   SDES URINE, RANDOM 01/14/2020 1059   SPECREQUEST  01/14/2020 1059    NONE Performed at [facility], [place] [address] [place], [place] [address]    CULT MULTIPLE SPECIES PRESENT, SUGGEST RECOLLECTION (A) 01/14/2020 1059   REPTSTATUS 01/15/2020 FINAL 01/14/2020 1059     Radiological Exams on Admission: CT Angio Chest PE W and/or Wo Contrast  Result Date: 05/11/2021 CLINICAL DATA:  Pulmonary embolus suspected with high probability. Chest pain, muscle pain, and shortness of breath. History of metastatic  breast cancer. EXAM: CT ANGIOGRAPHY CHEST WITH CONTRAST TECHNIQUE: Multidetector CT imaging of the chest was performed using the standard protocol during bolus administration of intravenous contrast. Multiplanar CT image reconstructions and MIPs were obtained to evaluate the vascular anatomy. CONTRAST:  80mL OMNIPAQUE IOHEXOL 350 MG/ML SOLN COMPARISON:  03/16/2021 FINDINGS: Cardiovascular: Motion artifact limits the examination. There is good opacification of the central and segmental pulmonary arteries. No focal filling defects. No evidence of significant pulmonary embolus. Normal heart size. Normal caliber thoracic aorta. No evidence of aortic dissection. Great vessel origins are patent. 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 Mediastinum/Nodes: Esophagus is decompressed. No significant lymphadenopathy in the mediastinum. Large lobulated mass demonstrated in the right breast corresponding to known cancer. There is suggestion of a smaller mass in the left breast. Extensive lymphadenopathy in both axillary regions. Right axillary lymph nodes measure up to about 4.4 cm in diameter. Left axillary lymph nodes measure up to about 3.6 cm in diameter. Mass or lymphadenopathy in the soft tissues anterior to the clavicular heads. Lungs/Pleura: No consolidation, airspace disease, or nodularity demonstrated in the lungs. No pleural effusions. No pneumothorax. Upper Abdomen: No acute abnormalities demonstrated  in the visualized upper abdomen. Musculoskeletal: Multiple bone metastases demonstrating bone destruction and soft tissue masses in the midthoracic spine, post fixation, in the manubrium, clavicular heads, and right posterior ribs. Similar appearance to the previous study. Review of the MIP images confirms the above findings. IMPRESSION: 1. No evidence of significant pulmonary embolus. 2. Lungs are clear. 3. Sequela of metastatic breast cancer including bilateral soft  tissue breast masses, greater on the right. Axillary and supraclavicular lymphadenopathy. Multiple bone metastases. Electronically Signed   By: Lucienne Capers M.D.   On: 05/11/2021 22:44   _______________________________________________________________________________________________________ Latest  Blood pressure (!) 142/97, pulse (!) 101, temperature 99.7 F (37.6 C), temperature source Oral, resp. rate (!) 28, height 5\' 4"  (1.626 m), weight 55.5 kg, SpO2 98 %.   Review of Systems:    Pertinent positives include:  fatigue, chest pain, back pain.   Constitutional:  No weight loss, night sweats, Fevers, chills,  weight loss  HEENT:  No headaches, Difficulty swallowing,Tooth/dental problems,Sore throat,  No sneezing, itching, ear ache, nasal congestion, post nasal drip,  Cardio-vascular:  No  Orthopnea, PND, anasarca, dizziness, palpitations.no Bilateral lower extremity swelling  GI:  No heartburn, indigestion, abdominal pain, nausea, vomiting, diarrhea, change in bowel habits, loss of appetite, melena, blood in stool, hematemesis Resp:  no shortness of breath at rest. No dyspnea on exertion, No excess mucus, no productive cough, No non-productive cough, No coughing up of blood.No change in color of mucus.No wheezing. Skin:  no rash or lesions. No jaundice GU:  no dysuria, change in color of urine, no urgency or frequency. No straining to urinate.  No flank pain.  Musculoskeletal:  No joint pain or no joint swelling. No decreased range of motion.   Psych:  No change in mood or affect. No depression or anxiety. No memory loss.  Neuro: no localizing neurological complaints, no tingling, no weakness, no double vision, no gait abnormality, no slurred speech, no confusion  All systems reviewed and apart from [place] all are negative _______________________________________________________________________________________________ Past Medical History:   Past Medical History:  Diagnosis Date    Abnormal uterine bleeding    Anemia    Breast cancer ([place])    Depression    Fibroid    Hormone disorder    Hypertension    Infertility, female      Past Surgical History:  Procedure Laterality Date   ABDOMINAL HYSTERECTOMY     BREAST SURGERY     LAMINECTOMY N/A 01/04/2020   Procedure: Thoracic Eight, Thoracic Nine THORACIC LAMINECTOMY FOR TUMOR with Thoracic Seven-Thoracic Ten instrumentation;  Surgeon: [name], MD;  Location: [facility];  Service: Neurosurgery;  Laterality: N/A;  Thoracic Eight, Thoracic Nine THORACIC LAMINECTOMY FOR TUMOR with Thoracic Seven-Thoracic Ten instrumentation   MASTECTOMY     salivary gland stone      Social History:  Ambulatory  cane       reports that she has never smoked. She has never used smokeless tobacco. She reports that she does not drink alcohol and does not use drugs.   Family History:   Family History  Problem Relation Age of Onset   Cancer Maternal Aunt        breast   Stroke Maternal Aunt    Heart attack Maternal Aunt    Cancer Maternal Grandfather    Colon cancer Maternal Grandfather    Stroke Maternal Grandfather    Cancer Paternal Grandfather    Colon cancer Paternal Grandfather    Stroke Mother    Heart  attack Mother    Diabetes Mother    Hyperlipidemia Mother    Heart disease Mother    Diabetes Father    Stroke Father    Stroke Paternal Grandmother    Heart attack Paternal Grandmother    Heart failure Paternal Grandmother    Diabetes Paternal Grandmother    Hyperlipidemia Paternal Grandmother    CAD Paternal Grandmother    Stroke Maternal Grandmother    Breast cancer Maternal Aunt    ______________________________________________________________________________________________ Allergies: Allergies  Allergen Reactions   Amoxicillin     Other reaction(s): rash/itching   Penicillins Rash    Did it involve swelling of the face/tongue/throat, SOB, or low BP? no Did it involve sudden or severe rash/hives, skin  peeling, or any reaction on the inside of your mouth or nose? Yes Did you need to seek medical attention at a hospital or doctor's office? No When did it last happen? early 20's     If all above answers are "NO", may proceed with cephalosporin use.   Pork-Derived Products    Other    Penicillin G    Tamoxifen Hypertension     Prior to Admission medications   Medication Sig Start Date End Date Taking? Authorizing Provider  acetaminophen (TYLENOL) 325 MG tablet Take 2 tablets (650 mg total) by mouth every 4 (four) hours as needed for mild pain ((score 1 to 3) or temp > 100.5). 01/08/20   Viona Gilmore D, NP  amLODipine (NORVASC) 10 MG tablet Take 1 tablet (10 mg total) by mouth daily. 11/16/20   Nicholas Lose, MD  Ascorbic Acid (VITAMIN C PO) Take by mouth.    [provider]  Bioflavonoid Products (ESTER C PO) Take 1 tablet by mouth with breakfast, with lunch, and with evening meal.     [provider]  bisacodyl (DULCOLAX) 10 MG suppository Place 1 suppository (10 mg total) rectally daily as needed for moderate constipation. 01/08/20   Viona Gilmore D, NP  BLACK COHOSH EXTRACT PO Take 1 tablet by mouth in the morning, at noon, and at bedtime.    [provider]  BLACK CURRANT SEED OIL PO Take by mouth.    [provider]  Calcium Carb-Cholecalciferol (OYSTER SHELL CALCIUM W/D) 500-200 MG-UNIT TABS TAKE 1 TABLET BY MOUTH TWICE A DAY WITH MEALS 03/23/21   Nicholas Lose, MD  calcium carbonate (OS-CAL - DOSED IN MG OF ELEMENTAL CALCIUM) 1250 (500 Ca) MG tablet Take 1 tablet (500 mg of elemental calcium total) by mouth 2 (two) times daily with a meal. 06/30/20   Nicholas Lose, MD  Cholecalciferol ([employer] D3) 125 MCG (5000 UT) capsule Take 1 capsule (5,000 Units total) by mouth 2 (two) times daily. 06/30/20   Nicholas Lose, MD  diazepam (VALIUM) 5 MG tablet Take 1-2 tablets (5-10 mg total) by mouth every 6 (six) hours as needed for muscle spasms. 01/22/20   Bayard Hugger, NP  Flaxseed Oil OIL Take 5 mLs by mouth in the morning, at noon, and at bedtime.     [provider]  goserelin (ZOLADEX) 3.6 MG injection Inject 3.6 mg into the skin every 28 (twenty-eight) days.    [provider]  letrozole (FEMARA) 2.5 MG tablet TAKE 1 TABLET BY MOUTH EVERY DAY 09/20/20   Nicholas Lose, MD  lidocaine-prilocaine (EMLA) cream APPLY TOPICALLY AS NEEDED. 12/22/20 12/22/21  Nicholas Lose, MD  Oxycodone HCl 10 MG TABS Take 1 tablet (10 mg total) by mouth every 3 (three) hours as needed ((  score 7 to 10)). 02/15/20   Lovorn, Jinny Blossom, MD  palbociclib (IBRANCE) 125 MG tablet TAKE 1 TABLET (125 MG TOTAL) BY MOUTH DAILY. TAKE FOR 21 DAYS ON, 7 DAYS OFF, REPEAT EVERY 28 DAYS. 03/31/21 03/31/22  Nicholas Lose, MD  polyethylene glycol (MIRALAX / GLYCOLAX) 17 g packet Take 17 g by mouth daily as needed for mild constipation. 01/22/20   Bayard Hugger, NP  sucralfate (CARAFATE) 1 g tablet Take 1 tablet (1 g total) by mouth 4 (four) times daily -  with meals and at bedtime. Crush and dissolve in 10 mL of warm water prior to swallowing 02/10/20   Gery Pray, MD  sulfamethoxazole-trimethoprim (BACTRIM DS) 800-160 MG tablet Take 1 tablet by mouth 3 (three) times a week. 11/25/20   Nicholas Lose, MD    ___________________________________________________________________________________________________ Physical Exam: Vitals with BMI 05/11/2021 05/11/2021 03/21/2021  Height - 5\' 4"  5\' 5"   Weight - 122 lbs 4 oz 119 lbs 2 oz  BMI - 41.93 79.02  Systolic 409 735 329  Diastolic 97 924 79  Pulse 101 123 106      1. General:  in No  Acute distress   Chronically ill   -appearing 2. Psychological: Alert and  Oriented 3. Head/ENT:    Dry Mucous Membranes                          Head Non traumatic, neck supple                          Normal  Dentition 4. SKIN:  decreased Skin turgor,  Skin clean Dry large fungating mass of the right breast with discharge   5. Heart: Regular rate and  rhythm no Murmur, no Rub or gallop 6. Lungs:  Clear to auscultation bilaterally, no wheezes or crackles   7. Abdomen: Soft,  non-tender, Non distended  bowel sounds present 8. Lower extremities: no clubbing, cyanosis, no  edema 9. Neurologically Grossly intact,  diminished strength in legs bilateraly 10. MSK: Normal range of motion    Chart has been reviewed  ______________________________________________________________________________________________  Assessment/Plan 50 y.o. 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 Continue home medications with appreciate oncology input regarding goals of care discussion   Wound infection -metastatic breast cancer fungating mass appears to be infected with drainage.  Covered Rocephin and vancomycin.  Check MRSA    Cancer related pain -treat pain we will need to adjust home regimen make sure patient is on bowel regimen   Other plan as per orders.  DVT prophylaxis:  SCD      Code Status:    Code Status: Prior FULL CODE   as per patient   I had personally discussed CODE STATUS with patient     Family Communication:   Family not at  Bedside    Disposition Plan:  To home once workup is complete and patient is stable   Following barriers for discharge:  Electrolytes corrected                                                        Pain controlled with PO medications                                                       Will likely need home  health,                            Will need consultants to evaluate patient prior to discharge    Would benefit from PT/OT eval prior to DC  Ordered                                     Wound care  consulted                                       Consults called: email Dr. Lindi Adie  Admission status:  ED Disposition     ED Disposition  [place]: [facility] [[id number]]  Level of Care: Telemetry [5]  Admit to tele based on following criteria: Other see comments  Comments: tachycardia  May admit patient to [facility] or [facility] if equivalent level of care is available:: No  Covid Evaluation: Asymptomatic Screening Protocol (No Symptoms)  Diagnosis: AKI (acute kidney injury) [facility]) [[id number]]  Admitting Physician: Toy Baker [[id number]]  Attending Physician: Toy Baker [[id number]]  Estimated length of stay: past midnight tomorrow  Certification:: I certify this patient will need inpatient services for at least 2 midnights          inpatient     I Expect 2 midnight stay secondary to severity of patient's current illness need for inpatient interventions justified by the following:  hemodynamic instability despite optimal treatment (tachycardia  )   Severe lab/radiological/exam abnormalities including:  AKi and wound infection      malignancy,    That are currently affecting medical management.   I expect  patient to be hospitalized for 2 midnights requiring inpatient medical care.  Patient is at high risk for adverse outcome (such as loss of life or disability) if not treated.  Indication for inpatient stay as follows:    severe pain requiring acute inpatient management,       Need for IV antibiotics, IV fluids,     Level of care    tele  For 12H      Precautions: admitted as  asymptomatic screening protocol    PPE: Used by the provider:   N95  eye Goggles,  Gloves     Litzi Binning 05/11/2021, 12:57 AM   [facility]     after 2 AM please page floor coverage PA If 7AM-7PM, please contact the day team taking care of the patient using [email or web address]   Patient was evaluated in the context of the global COVID-19 pandemic,  which necessitated consideration that the patient might be at risk for infection with the SARS-CoV-2 virus that causes COVID-19. Institutional protocols and algorithms that pertain to the evaluation of patients at risk for COVID-19 are in a state of rapid change based on information released by regulatory bodies including the CDC and federal and state organizations. These policies and algorithms were followed during the patient's care.

## 2021-05-11 NOTE — ED Triage Notes (Signed)
Pt reports wound on right breast that has recurrent infections. Currently taking oral chemotherapy for breast cancer. 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 Ambulatory with cane and minimal assistance.

## 2021-05-11 NOTE — ED Provider Notes (Signed)
[facility] DEPT Provider Note   CSN: [id number] Arrival date & time: 05/11/21  2036     History Chief Complaint  Patient presents with   Wound Infection    Misty Arnold is a 50 y.o. female.  Pt presents to the ED today with pain and so.  Pt has a hx of metastatic breast cancer.  She was initially diagnosed in November of 2011.  She follows with Dr. Lindi Adie for her breast cancer.  She is on oral chemo. She had a CT scan of her CAP in May which showed worsening of her lymph node lesions, osseous lesions, and soft tissue.  Her right chest has a fungating tumor.  She said she has not had any fevers.  Pain is worse and sob is worse.        Past Medical History:  Diagnosis Date   Abnormal uterine bleeding    Anemia    Breast cancer ([place])    Depression    Fibroid    Hormone disorder    Hypertension    Infertility, female     Patient Active Problem List   Diagnosis Date Noted   AKI (acute kidney injury) ([place]) 05/11/2021   Palliative care patient 02/15/2020   Myelopathy ([place]) 01/08/2020   Incomplete paraplegia ([place]) 01/08/2020   Epidural mass    Metastatic breast cancer (H[facility])    Weakness of both legs    Leucocytosis    Acute blood loss anemia    Neurogenic bladder    Neurogenic bowel    S/P laminectomy 01/04/2020   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   Breast cancer of upper-outer quadrant of right female breast ([place]) 11/02/2014    Past Surgical History:  Procedure Laterality Date   ABDOMINAL HYSTERECTOMY     BREAST SURGERY     LAMINECTOMY N/A 01/04/2020   Procedure: Thoracic Eight, Thoracic Nine THORACIC LAMINECTOMY FOR TUMOR with Thoracic Seven-Thoracic Ten instrumentation;  Surgeon: [name], MD;  Location: [facility];  Service: Neurosurgery;  Laterality: N/A;  Thoracic Eight, Thoracic Nine THORACIC LAMINECTOMY FOR TUMOR with Thoracic Seven-Thoracic Ten instrumentation   MASTECTOMY     salivary gland stone       OB  History     Gravida  0   Para  0   Term  0   Preterm  0   AB  0   Living  0      SAB  0   IAB  0   Ectopic  0   Multiple  0   Live Births  0           Family History  Problem Relation Age of Onset   Cancer Maternal Aunt        breast   Stroke Maternal Aunt    Heart attack Maternal Aunt    Cancer Maternal Grandfather    Colon cancer Maternal Grandfather    Stroke Maternal Grandfather    Cancer Paternal Grandfather    Colon cancer Paternal Grandfather    Stroke Mother    Heart attack Mother    Diabetes Mother    Hyperlipidemia Mother    Heart disease Mother    Diabetes Father    Stroke Father    Stroke Paternal Grandmother    Heart attack Paternal Grandmother    Heart failure Paternal Grandmother    Diabetes Paternal Grandmother    Hyperlipidemia Paternal Grandmother    CAD Paternal Grandmother    Stroke Maternal Grandmother  Breast cancer Maternal Aunt     Social History   Tobacco Use   Smoking status: Never   Smokeless tobacco: Never  Vaping Use   Vaping Use: Never used  Substance Use Topics   Alcohol use: No   Drug use: No    Home Medications Prior to Admission medications   Medication Sig Start Date End Date Taking? Authorizing Provider  acetaminophen (TYLENOL) 325 MG tablet Take 2 tablets (650 mg total) by mouth every 4 (four) hours as needed for mild pain ((score 1 to 3) or temp > 100.5). 01/08/20   Viona Gilmore D, NP  amLODipine (NORVASC) 10 MG tablet Take 1 tablet (10 mg total) by mouth daily. 11/16/20   Nicholas Lose, MD  Ascorbic Acid (VITAMIN C PO) Take by mouth.    [provider]  Bioflavonoid Products (ESTER C PO) Take 1 tablet by mouth with breakfast, with lunch, and with evening meal.     [provider]  bisacodyl (DULCOLAX) 10 MG suppository Place 1 suppository (10 mg total) rectally daily as needed for moderate constipation. 01/08/20   Viona Gilmore D, NP  BLACK COHOSH EXTRACT PO Take 1 tablet by mouth  in the morning, at noon, and at bedtime.    [provider]  BLACK CURRANT SEED OIL PO Take by mouth.    [provider]  Calcium Carb-Cholecalciferol (OYSTER SHELL CALCIUM W/D) 500-200 MG-UNIT TABS TAKE 1 TABLET BY MOUTH TWICE A DAY WITH MEALS 03/23/21   Nicholas Lose, MD  calcium carbonate (OS-CAL - DOSED IN MG OF ELEMENTAL CALCIUM) 1250 (500 Ca) MG tablet Take 1 tablet (500 mg of elemental calcium total) by mouth 2 (two) times daily with a meal. 06/30/20   Nicholas Lose, MD  Cholecalciferol (CVS D3) 125 MCG (5000 UT) capsule Take 1 capsule (5,000 Units total) by mouth 2 (two) times daily. 06/30/20   Nicholas Lose, MD  diazepam (VALIUM) 5 MG tablet Take 1-2 tablets (5-10 mg total) by mouth every 6 (six) hours as needed for muscle spasms. 01/22/20   Bayard Hugger, NP  Flaxseed Oil OIL Take 5 mLs by mouth in the morning, at noon, and at bedtime.     [provider]  goserelin (ZOLADEX) 3.6 MG injection Inject 3.6 mg into the skin every 28 (twenty-eight) days.    [provider]  letrozole [facility]) 2.5 MG tablet TAKE 1 TABLET BY MOUTH EVERY DAY 09/20/20   Nicholas Lose, MD  lidocaine-prilocaine (EMLA) cream APPLY TOPICALLY AS NEEDED. 12/22/20 12/22/21  Nicholas Lose, MD  Oxycodone HCl 10 MG TABS Take 1 tablet (10 mg total) by mouth every 3 (three) hours as needed ((score 7 to 10)). 02/15/20   Lovorn, Jinny Blossom, MD  palbociclib (IBRANCE) 125 MG tablet TAKE 1 TABLET (125 MG TOTAL) BY MOUTH DAILY. TAKE FOR 21 DAYS ON, 7 DAYS OFF, REPEAT EVERY 28 DAYS. 03/31/21 03/31/22  Nicholas Lose, MD  polyethylene glycol (MIRALAX / GLYCOLAX) 17 g packet Take 17 g by mouth daily as needed for mild constipation. 01/22/20   Bayard Hugger, NP  sucralfate (CARAFATE) 1 g tablet Take 1 tablet (1 g total) by mouth 4 (four) times daily -  with meals and at bedtime. Crush and dissolve in 10 mL of warm water prior to swallowing 02/10/20   Gery Pray, MD  sulfamethoxazole-trimethoprim (BACTRIM DS)  800-160 MG tablet Take 1 tablet by mouth 3 (three) times a week. 11/25/20   Nicholas Lose, MD    Allergies    Amoxicillin,  Penicillins, Pork-derived products, Other, Penicillin g, and Tamoxifen  Review of Systems   Review of Systems  Respiratory:  Positive for shortness of breath.   Cardiovascular:  Positive for chest pain.  All other systems reviewed and are negative.  Physical Exam Updated Vital Signs BP (!) 142/97   Pulse (!) 101   Temp 99.7 F (37.6 C) (Oral)   Resp (!) 28   Ht 5\' 4"  (1.626 m)   Wt 55.5 kg   SpO2 98%   BMI 20.98 kg/m   Physical Exam Vitals and nursing note reviewed.  Constitutional:      Appearance: Normal appearance.  HENT:     Head: Normocephalic and atraumatic.     Right Ear: External ear normal.     Left Ear: External ear normal.     Nose: Nose normal.     Mouth/Throat:     Mouth: Mucous membranes are dry.  Eyes:     Extraocular Movements: Extraocular movements intact.     Conjunctiva/sclera: Conjunctivae normal.     Pupils: Pupils are equal, round, and reactive to light.  Cardiovascular:     Rate and Rhythm: Regular rhythm. Tachycardia present.     Pulses: Normal pulses.     Heart sounds: Normal heart sounds.  Pulmonary:     Effort: Pulmonary effort is normal.     Breath sounds: Normal breath sounds.  Chest:     Comments: Large right chest fungating lesion.  No drainage.   Abdominal:     General: Abdomen is flat. Bowel sounds are normal.     Palpations: Abdomen is soft.  Musculoskeletal:        General: Normal range of motion.     Cervical back: Normal range of motion and neck supple.  Skin:    General: Skin is warm.     Capillary Refill: Capillary refill takes less than 2 seconds.     Comments: Multiple soft tissue lesions to chest wall  Neurological:     General: No focal deficit present.     Mental Status: She is alert and oriented to person, place, and time.  Psychiatric:        Mood and Affect: Mood normal.        Behavior:  Behavior normal.    ED Results / Procedures / Treatments   Labs (all labs ordered are listed, but only abnormal results are displayed) Labs Reviewed  BASIC METABOLIC PANEL - Abnormal; Notable for the following components:      Result Value   Glucose, Bld 121 (*)    Creatinine, Ser 1.24 (*)    GFR, Estimated 53 (*)    All other components within normal limits  CBC WITH DIFFERENTIAL/PLATELET - Abnormal; Notable for the following components:   WBC 2.6 (*)    RBC 3.85 (*)    RDW 16.4 (*)    Neutro Abs 1.3 (*)    All other components within normal limits  RESP PANEL BY RT-PCR (FLU A&B, COVID) ARPGX2  BRAIN NATRIURETIC PEPTIDE  LACTIC ACID, PLASMA  CK  MAGNESIUM  PHOSPHORUS  URINALYSIS, ROUTINE W REFLEX MICROSCOPIC  HEPATIC FUNCTION PANEL    EKG EKG Interpretation  Date/Time:  Thursday May 11 2021 21:45:02 EDT Ventricular Rate:  105 PR Interval:  157 QRS Duration: 80 QT Interval:  331 QTC Calculation: 438 R Axis:   61 Text Interpretation: Sinus tachycardia Left atrial enlargement No old tracing to compare Confirmed by Isla Pence [phone]) on 05/11/2021 10:34:25 PM  Radiology CT Angio  Chest PE W and/or Wo Contrast  Result Date: 05/11/2021 CLINICAL DATA:  Pulmonary embolus suspected with high probability. Chest pain, muscle pain, and shortness of breath. History of metastatic breast cancer. EXAM: CT ANGIOGRAPHY CHEST WITH CONTRAST TECHNIQUE: Multidetector CT imaging of the chest was performed using the standard protocol during bolus administration of intravenous contrast. Multiplanar CT image reconstructions and MIPs were obtained to evaluate the vascular anatomy. CONTRAST:  25mL OMNIPAQUE IOHEXOL 350 MG/ML SOLN COMPARISON:  03/16/2021 FINDINGS: Cardiovascular: Motion artifact limits the examination. There is good opacification of the central and segmental pulmonary arteries. No focal filling defects. No evidence of significant pulmonary embolus. Normal heart size. Normal caliber  thoracic aorta. No evidence of aortic dissection. Great vessel origins are patent. 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 Mediastinum/Nodes: Esophagus is decompressed. No significant lymphadenopathy in the mediastinum. Large lobulated mass demonstrated in the right breast corresponding to known cancer. There is suggestion of a smaller mass in the left breast. Extensive lymphadenopathy in both axillary regions. Right axillary lymph nodes measure up to about 4.4 cm in diameter. Left axillary lymph nodes measure up to about 3.6 cm in diameter. Mass or lymphadenopathy in the soft tissues anterior to the clavicular heads. Lungs/Pleura: No consolidation, airspace disease, or nodularity demonstrated in the lungs. No pleural effusions. No pneumothorax. Upper Abdomen: No acute abnormalities demonstrated in the visualized upper abdomen. Musculoskeletal: Multiple bone metastases demonstrating bone destruction and soft tissue masses in the midthoracic spine, post fixation, in the manubrium, clavicular heads, and right posterior ribs. Similar appearance to the previous study. Review of the MIP images confirms the above findings. IMPRESSION: 1. No evidence of significant pulmonary embolus. 2. Lungs are clear. 3. Sequela of metastatic breast cancer including bilateral soft tissue breast masses, greater on the right. Axillary and supraclavicular lymphadenopathy. Multiple bone metastases. Electronically Signed   By: Lucienne Capers M.D.   On: 05/11/2021 22:44    Procedures Procedures   Medications Ordered in ED Medications  morphine 4 MG/ML injection 4 mg (has no administration in time range)  morphine 4 MG/ML injection 4 mg (4 mg Intravenous Given 05/11/21 2154)  ondansetron (ZOFRAN) injection 4 mg (4 mg Intravenous Given 05/11/21 2151)  sodium chloride 0.9 % bolus 1,000 mL (1,000 mLs Intravenous Bolus from Bag 05/11/21 2155)  iohexol (OMNIPAQUE) 350 MG/ML injection 80 mL (80  mLs Intravenous Contrast Given 05/11/21 2228)    ED Course  I have reviewed the triage vital signs and the nursing notes.  Pertinent labs & imaging results that were available during my care of the patient were reviewed by me and considered in my medical decision making (see chart for details).    MDM Rules/Calculators/A&P                          CT chest did not show PE.  It did show significant bony mets.  She is in a lot of pain.  I think it would be beneficial to admit overnight for pain control.  Pt d/w Dr. Roel Cluck (triad) for admission.   Final Clinical Impression(s) / ED Diagnoses Final diagnoses:  Metastatic breast cancer ([place])  Bony metastasis ([place])  Intractable pain    Rx / DC Orders ED Discharge Orders     None        Isla Pence, MD 05/11/21 2324

## 2021-05-12 DIAGNOSIS — T148XXA Other injury of unspecified body region, initial encounter: Secondary | ICD-10-CM | POA: Diagnosis present

## 2021-05-12 DIAGNOSIS — L089 Local infection of the skin and subcutaneous tissue, unspecified: Secondary | ICD-10-CM | POA: Diagnosis present

## 2021-05-12 DIAGNOSIS — G893 Neoplasm related pain (acute) (chronic): Secondary | ICD-10-CM | POA: Diagnosis present

## 2021-05-12 DIAGNOSIS — N179 Acute kidney failure, unspecified: Principal | ICD-10-CM

## 2021-05-12 DIAGNOSIS — C50919 Malignant neoplasm of unspecified site of unspecified female breast: Secondary | ICD-10-CM

## 2021-05-12 LAB — CBC WITH DIFFERENTIAL/PLATELET
Abs Immature Granulocytes: 0 10*3/uL (ref 0.00–0.07)
Basophils Absolute: 0.1 10*3/uL (ref 0.0–0.1)
Basophils Relative: 3 %
Eosinophils Absolute: 0 10*3/uL (ref 0.0–0.5)
Eosinophils Relative: 2 %
HCT: 36 % (ref 36.0–46.0)
Hemoglobin: 12.2 g/dL (ref 12.0–15.0)
Immature Granulocytes: 0 %
Lymphocytes Relative: 41 %
Lymphs Abs: 0.9 10*3/uL (ref 0.7–4.0)
MCH: 33.2 pg (ref 26.0–34.0)
MCHC: 33.9 g/dL (ref 30.0–36.0)
MCV: 98.1 fL (ref 80.0–100.0)
Monocytes Absolute: 0.2 10*3/uL (ref 0.1–1.0)
Monocytes Relative: 9 %
Neutro Abs: 1 10*3/uL — ABNORMAL LOW (ref 1.7–7.7)
Neutrophils Relative %: 45 %
Platelets: 327 10*3/uL (ref 150–400)
RBC: 3.67 MIL/uL — ABNORMAL LOW (ref 3.87–5.11)
RDW: 16.4 % — ABNORMAL HIGH (ref 11.5–15.5)
WBC: 2.2 10*3/uL — ABNORMAL LOW (ref 4.0–10.5)
nRBC: 0 % (ref 0.0–0.2)

## 2021-05-12 LAB — URINALYSIS, ROUTINE W REFLEX MICROSCOPIC
Bilirubin Urine: NEGATIVE
Glucose, UA: NEGATIVE mg/dL
Hgb urine dipstick: NEGATIVE
Ketones, ur: 5 mg/dL — AB
Leukocytes,Ua: NEGATIVE
Nitrite: NEGATIVE
Protein, ur: NEGATIVE mg/dL
Specific Gravity, Urine: 1.02 (ref 1.005–1.030)
pH: 5 (ref 5.0–8.0)

## 2021-05-12 LAB — MAGNESIUM: Magnesium: 2.4 mg/dL (ref 1.7–2.4)

## 2021-05-12 LAB — TSH: TSH: 3.52 u[IU]/mL (ref 0.350–4.500)

## 2021-05-12 LAB — COMPREHENSIVE METABOLIC PANEL
ALT: 13 U/L (ref 0–44)
AST: 23 U/L (ref 15–41)
Albumin: 4 g/dL (ref 3.5–5.0)
Alkaline Phosphatase: 75 U/L (ref 38–126)
Anion gap: 8 (ref 5–15)
BUN: 13 mg/dL (ref 6–20)
CO2: 27 mmol/L (ref 22–32)
Calcium: 9.2 mg/dL (ref 8.9–10.3)
Chloride: 107 mmol/L (ref 98–111)
Creatinine, Ser: 0.91 mg/dL (ref 0.44–1.00)
GFR, Estimated: >60 mL/min (ref >60)
Glucose, Bld: 115 mg/dL — ABNORMAL HIGH (ref 70–99)
Potassium: 3.9 mmol/L (ref 3.5–5.1)
Sodium: 142 mmol/L (ref 135–145)
Total Bilirubin: 0.4 mg/dL (ref 0.3–1.2)
Total Protein: 7 g/dL (ref 6.5–8.1)

## 2021-05-12 LAB — RESP PANEL BY RT-PCR (FLU A&B, COVID) ARPGX2
Influenza A by PCR: NEGATIVE
Influenza B by PCR: NEGATIVE
SARS Coronavirus 2 by RT PCR: NEGATIVE

## 2021-05-12 LAB — HIV ANTIBODY (ROUTINE TESTING W REFLEX): HIV Screen 4th Generation wRfx: NONREACTIVE

## 2021-05-12 LAB — LACTIC ACID, PLASMA: Lactic Acid, Venous: 0.8 mmol/L (ref 0.5–1.9)

## 2021-05-12 LAB — PHOSPHORUS: Phosphorus: 3.5 mg/dL (ref 2.5–4.6)

## 2021-05-12 MED ORDER — VANCOMYCIN HCL 750 MG/150ML IV SOLN: 750.0000 mg | INTRAVENOUS | Status: DC

## 2021-05-12 MED ORDER — POTASSIUM CHLORIDE CRYS ER 20 MEQ PO TBCR
40.0000 meq | EXTENDED_RELEASE_TABLET | Freq: Once | ORAL | Status: AC
  Administered 2021-05-12: 40 meq via ORAL
  Filled 2021-05-12: qty 2

## 2021-05-12 MED ORDER — SUCRALFATE 1 G PO TABS: 1.0000 g | ORAL_TABLET | Freq: Three times a day (TID) | ORAL | Status: DC

## 2021-05-12 MED ORDER — OXYCODONE HCL 5 MG PO TABS: 10.0000 mg | ORAL_TABLET | ORAL | Status: DC | PRN

## 2021-05-12 MED ORDER — AMLODIPINE BESYLATE 10 MG PO TABS
10.0000 mg | ORAL_TABLET | Freq: Every day | ORAL | Status: DC
  Administered 2021-05-12 – 2021-05-13 (×2): 10 mg via ORAL
  Filled 2021-05-12: qty 2
  Filled 2021-05-12: qty 1

## 2021-05-12 MED ORDER — POLYETHYLENE GLYCOL 3350 17 G PO PACK
17.0000 g | PACK | Freq: Every day | ORAL | Status: DC | PRN
  Administered 2021-05-12 – 2021-05-13 (×2): 17 g via ORAL
  Filled 2021-05-12 (×2): qty 1

## 2021-05-12 MED ORDER — ACETAMINOPHEN 325 MG PO TABS: 650.0000 mg | ORAL_TABLET | Freq: Four times a day (QID) | ORAL | Status: DC | PRN

## 2021-05-12 MED ORDER — DIPHENHYDRAMINE HCL 50 MG/ML IJ SOLN
25.0000 mg | Freq: Once | INTRAMUSCULAR | Status: AC
  Administered 2021-05-12: 25 mg via INTRAVENOUS
  Filled 2021-05-12: qty 1

## 2021-05-12 MED ORDER — LETROZOLE 2.5 MG PO TABS
2.5000 mg | ORAL_TABLET | Freq: Every day | ORAL | Status: DC
  Administered 2021-05-12 – 2021-05-13 (×2): 2.5 mg via ORAL
  Filled 2021-05-12 (×3): qty 1

## 2021-05-12 MED ORDER — VANCOMYCIN HCL IN DEXTROSE 1-5 GM/200ML-% IV SOLN
1000.0000 mg | Freq: Once | INTRAVENOUS | Status: AC
  Administered 2021-05-12: 1000 mg via INTRAVENOUS
  Filled 2021-05-12: qty 200

## 2021-05-12 MED ORDER — HYDROMORPHONE HCL 1 MG/ML IJ SOLN
0.5000 mg | INTRAMUSCULAR | Status: DC | PRN
  Administered 2021-05-12: 0.5 mg via INTRAVENOUS
  Administered 2021-05-12 – 2021-05-13 (×2): 1 mg via INTRAVENOUS
  Filled 2021-05-12 (×4): qty 1

## 2021-05-12 MED ORDER — SODIUM CHLORIDE 0.9 % IV SOLN
75.0000 mL/h | INTRAVENOUS | Status: AC
  Administered 2021-05-12: 75 mL/h via INTRAVENOUS

## 2021-05-12 MED ORDER — ACETAMINOPHEN 650 MG RE SUPP: 650.0000 mg | Freq: Four times a day (QID) | RECTAL | Status: DC | PRN

## 2021-05-12 NOTE — Progress Notes (Addendum)
HEMATOLOGY-ONCOLOGY PROGRESS NOTE  SUBJECTIVE: Misty Arnold is followed by our office for metastatic breast cancer.  Her last visit with [name] was on 03/21/2021.  She had scans performed at that time which showed disease progression.  It was recommended for her to discontinue Ibrance at that time and to switch to alpelisib, but the patient want to continue with Ibrance for now.  She presented to the hospital with increased right-sided chest discomfort.  She was also found to have AKI and weakness in her lower extremities.  CTA chest negative for PE.  CT did show sequela of the metastatic breast cancer including bilateral soft tissue breast masses which were greater on the right with axillary and supraclavicular lymphadenopathy and multiple bone metastases.  The patient was seen in the emergency department today.  She reports right-sided chest discomfort which is at the lower aspect of her breast and radiates through to her back.  She tells me that her right breast wound is smaller in her opinion.  She did not express any concerns to me about infection to this wound.  She denies having any fevers or chills.  She is not having any shortness of breath, cough, abdominal pain, nausea, vomiting.  She reports that she continues take her Ibrance and letrozole as prescribed.  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; patient went to cancer treatment centers of [place] for second opinion and delayed therapy    01/26/2011 Surgery   right mastectomy followed by reconstruction: Invasive ductal carcinoma T1 N1 MIC M0 stage IB ER/PR positive HER-2 negative, BRCA negative, Oncotype DX low risk    05/29/2012 Procedure   right chest  wall nodule excision done on 06/24/2012 showed metastatic carcinoma margins were positive, but CT scan no metastatic disease, recommended chemotherapy but patient refused also refused reexcision    04/28/2013 Treatment Plan Change   patient went to [place] for alternative treatments and took herbal medications, tonics etc. But she could not afford these trips.    01/08/2014 Breast MRI   right breast multiple enhancing masses within the soft tissues largest 5.6 cm in wall skin surface and the capsule of the silicone prosthesis, contiguous nodules involving in inferomedial breast and tired and subcentimeter nodules across the midline    08/29/2015 Surgery   Right mastectomy: IDC with invol of skin and skin ulceration, breast capsule inv cancer, superior medial margin positive, 1/1 LN positive, grade 3, 14.3 cm, 3.5 cm, ALI, chest wall involv, ER 90-100%, PR 80-90%, HER-2 negative, Ki 67 40-50% T4CN1 (St 3B)    03/05/2016 -  Anti-estrogen oral therapy   Tamoxifen 20 mg daily stopped due to headache and uncontrolled hypertension. Decrease to 10 mg daily 04/03/2016, stopped June 2017 and took an estrogen metabolizer over-the-counter; started Aromasin April 2018 from a [place] physician, Zoladex started on 05/2017 and switched to Letrozole in 09/2017   01/04/2020 Surgery   Patient presented to the ED on 01/04/20 for worsening lower extremity numbness and underwent a decompressive laminectomy with tumor resection at T9 that was complicated with acute blood loss anemia and leukocytosis.   01/28/2020 - 02/15/2020 Radiation Therapy   Palliative radiation at site of thoracic tumor resection   03/03/2020 Miscellaneous    Ibrance with  exemestane and Zoladex    Miscellaneous   Guardant 360: ESR 1 mutations, PI K3 CA mutation, EGFR mutation, T p53 mutation   10/11/2020 Miscellaneous   Alpelisib with letrozole and Zoladex      REVIEW OF SYSTEMS:   Constitutional: Denies fevers, chills  Eyes: Denies blurriness  of vision Ears, nose, mouth, throat, and face: Denies mucositis or sore throat Respiratory: Denies cough, dyspnea or wheezes Cardiovascular: Reports right-sided chest pain which radiates through to her back Gastrointestinal:  Denies nausea, heartburn or change in bowel habits Skin: Open right breast wound Lymphatics: Denies new lymphadenopathy or easy bruising Neurological:Denies numbness, tingling or new weaknesses Behavioral/Psych: Mood is stable, no new changes  Extremities: No lower extremity edema All other systems were reviewed with the patient and are negative.  I have reviewed the past medical history, past surgical history, social history and family history with the patient and they are unchanged from previous note.   PHYSICAL EXAMINATION: ECOG PERFORMANCE STATUS: 2 - Symptomatic, <50% confined to bed  Vitals:   05/12/21 0604 05/12/21 0800  BP: (!) 143/101 (!) 133/96  Pulse: 80 90  Resp: (!) 21 12  Temp:    SpO2: 99% 99%   Filed Weights   05/11/21 2055  Weight: 55.5 kg    Intake/Output from previous day: No intake/output data recorded.  GENERAL: Thin, awake and alert, no distress, sitting up in stretcher. SKIN: Right breast wound not visualized as it was recently dressed.  Photo taken by admitting physician.  See below. EYES: normal, Conjunctiva are pink and non-injected, sclera clear OROPHARYNX:no exudate, no erythema and lips, buccal mucosa, and tongue normal  LUNGS: clear to auscultation and percussion with normal breathing effort HEART: regular rate & rhythm and no murmurs and no lower extremity edema ABDOMEN:abdomen soft, non-tender and normal bowel sounds NEURO: alert & oriented x 3 with fluent speech, no focal motor/sensory deficits     LABORATORY DATA:  I have reviewed the data as listed CMP Latest Ref Rng & Units 05/12/2021 05/11/2021 03/16/2021  Glucose 70 - 99 mg/dL 115(H) 121(H) 94  BUN 6 - 20 mg/dL _0 Creatinine 0.44 - 1.00 mg/dL 0.91 1.24(H)  1.00  Sodium 135 - 145 mmol/L 142 142 140  Potassium 3.5 - 5.1 mmol/L 3.9 3.5 4.2  Chloride 98 - 111 mmol/L 107 104 103  CO2 22 - 32 mmol/L _1 Calcium 8.9 - 10.3 mg/dL 9.2 9.4 9.6  Total Protein 6.5 - 8.1 g/dL 7.0 - 7.2  Total Bilirubin 0.3 - 1.2 mg/dL 0.4 - 0.3  Alkaline Phos 38 - 126 U/L 75 - 90  AST 15 - 41 U/L 23 - 28  ALT 0 - 44 U/L 13 - 12    Lab Results  Component Value Date   WBC 2.2 (L) 05/12/2021   HGB 12.2 05/12/2021   HCT 36.0 05/12/2021   MCV 98.1 05/12/2021   PLT 327 05/12/2021   NEUTROABS 1.0 (L) 05/12/2021    CT Angio Chest PE W and/or Wo Contrast  Result Date: 05/11/2021 CLINICAL DATA:  Pulmonary embolus suspected with high probability. Chest pain, muscle pain, and shortness of breath. History of metastatic breast cancer. EXAM: CT ANGIOGRAPHY CHEST WITH CONTRAST TECHNIQUE: Multidetector CT imaging of the chest was performed using the standard protocol during bolus administration of intravenous contrast. Multiplanar CT image reconstructions and MIPs were obtained to evaluate the vascular anatomy. CONTRAST:  71m OMNIPAQUE IOHEXOL 350 MG/ML SOLN COMPARISON:  03/16/2021 FINDINGS: Cardiovascular: Motion artifact  limits the examination. There is good opacification of the central and segmental pulmonary arteries. No focal filling defects. No evidence of significant pulmonary embolus. Normal heart size. Normal caliber thoracic aorta. No evidence of aortic dissection. Great vessel origins are patent. 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 Mediastinum/Nodes: Esophagus is decompressed. No significant lymphadenopathy in the mediastinum. Large lobulated mass demonstrated in the right breast corresponding to known cancer. There is suggestion of a smaller mass in the left breast. Extensive lymphadenopathy in both axillary regions. Right axillary lymph nodes measure up to about 4.4 cm in diameter. Left axillary lymph nodes measure up  to about 3.6 cm in diameter. Mass or lymphadenopathy in the soft tissues anterior to the clavicular heads. Lungs/Pleura: No consolidation, airspace disease, or nodularity demonstrated in the lungs. No pleural effusions. No pneumothorax. Upper Abdomen: No acute abnormalities demonstrated in the visualized upper abdomen. Musculoskeletal: Multiple bone metastases demonstrating bone destruction and soft tissue masses in the midthoracic spine, post fixation, in the manubrium, clavicular heads, and right posterior ribs. Similar appearance to the previous study. Review of the MIP images confirms the above findings. IMPRESSION: 1. No evidence of significant pulmonary embolus. 2. Lungs are clear. 3. Sequela of metastatic breast cancer including bilateral soft tissue breast masses, greater on the right. Axillary and supraclavicular lymphadenopathy. Multiple bone metastases. Electronically Signed   By: Lucienne Capers M.D.   On: 05/11/2021 22:44    ASSESSMENT AND PLAN: 1.  Metastatic breast cancer 2.  Fungating right breast mass  3.  T9 vertebral body compression status post decompressive laminectomy and tumor resection on 01/04/2020 followed by radiation to the thoracic spine 4.  Neutropenia 5.  AKI, improved 6.  Cancer related pain  -The patient has been maintained on letrozole and Ibrance 125 mg daily for her metastatic breast cancer.  Recommend for her to continue on letrozole.  We will hold Ibrance while she is in the hospital, but okay to resume upon discharge. -Appreciate wound care nurse consultation.  Continue dressings to right breast per their recommendations. -The patient has mild neutropenia due to her Ibrance.  We will temporarily hold Ibrance while she is in the hospital.  She may resume the Ibrance once she is discharged. -Continue current pain medications and adjust as needed.  Palliative care consult currently pending and would greatly appreciate their assistance with adjusting pain medication.    -We have previously recommended for the patient to enroll in palliative care services as an outpatient.  We would again recommend outpatient palliative care services upon discharge.   LOS: 1 day   Mikey Bussing, DNP, AGPCNP-BC, AOCNP 05/12/21  Oncology Attending Note  I personally saw the patient, reviewed the chart and examined the patient. The plan of care was discussed with the patient and the admitting team. I agree with the assessment and plan as documented above. Thank you very much for the consultation. Patient has widespread metastatic breast cancer who had progressed on Ibrance and antiestrogen therapy but she continued to remain on Ibrance.  She has a naturopathic/holistic approach to cancer care and did not want to go on alpelisib which was my recommendation for her treatment. In spite of this decision, she has continued to manage herself with regards to the widely fungating tumor and has not required a lot of supportive care over these years. She fully understands that she will likely pass away from breast cancer but does not intend to go on hospice care at this time.  Therefore we will continue with the current plan of care as outpatient. I appreciate the help of palliative care and providing services for her as inpatient and as an outpatient.

## 2021-05-12 NOTE — Progress Notes (Signed)
OT Cancellation Note  Patient Details Name: Misty Arnold MRN: [id number] DOB: 1971-06-29   Cancelled Treatment:    Reason Eval/Treat Not Completed: Patient at procedure or test/ unavailable When attempted to see patient transferring from ED to floor. Will re-attempt as schedule permits.  Delbert Phenix OT OT pager: [phone] 05/12/2021, 2:25 PM

## 2021-05-12 NOTE — Progress Notes (Signed)
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 She has had ongoing difficulty with evolution of her fungating right breast mass.  She has been under care of oncology since diagnosis in 2011.  She has declined palliative care/hospice recommendations recently. CTA was obtained on admission which was negative for PE and showed ongoing sequela of her metastatic breast cancer including bilateral soft tissue breast masses, greater on the right.  Also noted her axillary and supraclavicular lymphadenopathy along with multiple bony metastases.  Interval History:  Seen in the ER this morning awaiting room still.  Pain was better controlled after pain medication on admission. Tried doing some goals of care discussion regarding her advanced cancer with metastases however at this time she is still not thinking about pursuing palliative care or taking a step back from treatment.  ROS: Constitutional: positive for fatigue and malaise, negative for chills and fevers, Respiratory: negative for cough and sputum, Cardiovascular: negative for chest pain, and Gastrointestinal: negative for abdominal pain  Assessment & Plan:  AKI - baseline creatinine ~ 0.8 - patient presents with increase in creat >0.3 mg/dL above baseline, creat increase >1.5x baseline presumed to have occurred within past 7 days PTA - creat 1.24 on admission - improved with IVF  Stage IV Breast cancer - follows with Dr. Lindi Adie outpatient - dx 2011. 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   Old records reviewed in assessment of this patient  Antimicrobials: Vanc 7/7 >> 7/8  DVT prophylaxis: SCDs Start: 05/12/21 0136   Code Status:   Code Status: Full Code Family Communication:   Disposition Plan: Status is: Inpatient  Remains inpatient appropriate because:Ongoing active pain requiring inpatient pain management, IV treatments appropriate due to intensity of illness or inability to take PO, and Inpatient level of care appropriate due to severity of illness  Dispo: The patient is from: Home              Anticipated d/c is to: Home              Patient currently is not medically stable to d/c.   Difficult to place patient No  Risk of unplanned readmission score: Unplanned Admission- Pilot do not use: 12.17   Objective: Blood pressure (!) 143/92, pulse 89, temperature 98.4 F (36.9 C), temperature source Oral, resp. rate (!) 24, height 5\' 4"  (1.626 m), weight 55.5 kg, SpO2 97 %.  Examination: General appearance: alert, cooperative, fatigued, and no distress Head: Normocephalic, without obvious abnormality, atraumatic Eyes:  EOMI Lungs: clear to auscultation bilaterally Heart: regular rate and rhythm and S1, S2 normal Chest: see media tab pic; large fungating lesion noted along right chest wall; unable to express purulence; mass  is firm/hard Abdomen: normal findings: bowel sounds normal and soft, non-tender Extremities:  no edema Skin: mobility and turgor normal Neurologic: moves all 4 extremities  Consultants:  Oncology  Procedures:    Data Reviewed: I have personally reviewed following labs and imaging studies Results for orders placed or performed  during the hospital encounter of 05/11/21 (from the past 24 hour(s))  Basic metabolic panel     Status: Abnormal   Collection Time: 05/11/21  9:24 PM  Result Value Ref Range   Sodium 142 135 - 145 mmol/L   Potassium 3.5 3.5 - 5.1 mmol/L   Chloride 104 98 - 111 mmol/L   CO2 27 22 - 32 mmol/L   Glucose, Bld 121 (H) 70 - 99 mg/dL   BUN 17 6 - 20 mg/dL   Creatinine, Ser 1.24 (H) 0.44 - 1.00 mg/dL   Calcium 9.4 8.9 - 10.3 mg/dL   GFR, Estimated 53 (L) >60 mL/min   Anion gap 11 5 - 15  Brain natriuretic peptide     Status: None   Collection Time: 05/11/21  9:24 PM  Result Value Ref Range   B Natriuretic Peptide 70.9 0.0 - 100.0 pg/mL  CBC with Differential     Status: Abnormal   Collection Time: 05/11/21  9:24 PM  Result Value Ref Range   WBC 2.6 (L) 4.0 - 10.5 K/uL   RBC 3.85 (L) 3.87 - 5.11 MIL/uL   Hemoglobin 12.7 12.0 - 15.0 g/dL   HCT 37.4 36.0 - 46.0 %   MCV 97.1 80.0 - 100.0 fL   MCH 33.0 26.0 - 34.0 pg   MCHC 34.0 30.0 - 36.0 g/dL   RDW 16.4 (H) 11.5 - 15.5 %   Platelets 350 150 - 400 K/uL   nRBC 0.0 0.0 - 0.2 %   Neutrophils Relative % 49 %   Neutro Abs 1.3 (L) 1.7 - 7.7 K/uL   Lymphocytes Relative 38 %   Lymphs Abs 1.0 0.7 - 4.0 K/uL   Monocytes Relative 8 %   Monocytes Absolute 0.2 0.1 - 1.0 K/uL   Eosinophils Relative 2 %   Eosinophils Absolute 0.1 0.0 - 0.5 K/uL   Basophils Relative 3 %   Basophils Absolute 0.1 0.0 - 0.1 K/uL   Immature Granulocytes 0 %   Abs Immature Granulocytes 0.01 0.00 - 0.07 K/uL   Reactive, Benign Lymphocytes PRESENT   CK     Status: None   Collection Time: 05/11/21  9:24 PM  Result Value Ref Range   Total CK 90 38 - 234 U/L  Magnesium     Status: None   Collection Time: 05/11/21  9:24 PM  Result Value Ref Range   Magnesium 2.4 1.7 - 2.4 mg/dL  Phosphorus     Status: None   Collection Time: 05/11/21  9:24 PM  Result Value Ref Range   Phosphorus 4.1 2.5 - 4.6 mg/dL  Resp Panel by RT-PCR (Flu A&B, Covid) Nasopharyngeal Swab      Status: None   Collection Time: 05/11/21 10:58 PM   Specimen: Nasopharyngeal Swab; Nasopharyngeal(NP) swabs in vial transport medium  Result Value Ref Range   SARS Coronavirus 2 by RT PCR NEGATIVE NEGATIVE   Influenza A by PCR NEGATIVE NEGATIVE   Influenza B by PCR NEGATIVE NEGATIVE  Lactic acid, plasma     Status: None   Collection Time: 05/11/21 11:22 PM  Result Value Ref Range   Lactic Acid, Venous 0.8 0.5 - 1.9 mmol/L  Urinalysis, Routine w reflex  microscopic Urine, Clean Catch     Status: Abnormal   Collection Time: 05/11/21 11:23 PM  Result Value Ref Range   Color, Urine YELLOW YELLOW   APPearance CLEAR CLEAR   Specific Gravity, Urine 1.020 1.005 - 1.030   pH 5.0 5.0 - 8.0   Glucose, UA NEGATIVE NEGATIVE mg/dL   Hgb urine dipstick NEGATIVE NEGATIVE   Bilirubin Urine NEGATIVE NEGATIVE   Ketones, ur 5 (A) NEGATIVE mg/dL   Protein, ur NEGATIVE NEGATIVE mg/dL   Nitrite NEGATIVE NEGATIVE   Leukocytes,Ua NEGATIVE NEGATIVE  HIV Antibody (routine testing w rflx)     Status: None   Collection Time: 05/12/21  4:43 AM  Result Value Ref Range   HIV Screen 4th Generation wRfx Non Reactive Non Reactive  Magnesium     Status: None   Collection Time: 05/12/21  4:43 AM  Result Value Ref Range   Magnesium 2.4 1.7 - 2.4 mg/dL  Phosphorus     Status: None   Collection Time: 05/12/21  4:43 AM  Result Value Ref Range   Phosphorus 3.5 2.5 - 4.6 mg/dL  CBC WITH DIFFERENTIAL     Status: Abnormal   Collection Time: 05/12/21  4:43 AM  Result Value Ref Range   WBC 2.2 (L) 4.0 - 10.5 K/uL   RBC 3.67 (L) 3.87 - 5.11 MIL/uL   Hemoglobin 12.2 12.0 - 15.0 g/dL   HCT 36.0 36.0 - 46.0 %   MCV 98.1 80.0 - 100.0 fL   MCH 33.2 26.0 - 34.0 pg   MCHC 33.9 30.0 - 36.0 g/dL   RDW 16.4 (H) 11.5 - 15.5 %   Platelets 327 150 - 400 K/uL   nRBC 0.0 0.0 - 0.2 %   Neutrophils Relative % 45 %   Neutro Abs 1.0 (L) 1.7 - 7.7 K/uL   Lymphocytes Relative 41 %   Lymphs Abs 0.9 0.7 - 4.0 K/uL   Monocytes  Relative 9 %   Monocytes Absolute 0.2 0.1 - 1.0 K/uL   Eosinophils Relative 2 %   Eosinophils Absolute 0.0 0.0 - 0.5 K/uL   Basophils Relative 3 %   Basophils Absolute 0.1 0.0 - 0.1 K/uL   Immature Granulocytes 0 %   Abs Immature Granulocytes 0.00 0.00 - 0.07 K/uL   Reactive, Benign Lymphocytes PRESENT   TSH     Status: None   Collection Time: 05/12/21  4:43 AM  Result Value Ref Range   TSH 3.520 0.350 - 4.500 uIU/mL  Comprehensive metabolic panel     Status: Abnormal   Collection Time: 05/12/21  4:43 AM  Result Value Ref Range   Sodium 142 135 - 145 mmol/L   Potassium 3.9 3.5 - 5.1 mmol/L   Chloride 107 98 - 111 mmol/L   CO2 27 22 - 32 mmol/L   Glucose, Bld 115 (H) 70 - 99 mg/dL   BUN 13 6 - 20 mg/dL   Creatinine, Ser 0.91 0.44 - 1.00 mg/dL   Calcium 9.2 8.9 - 10.3 mg/dL   Total Protein 7.0 6.5 - 8.1 g/dL   Albumin 4.0 3.5 - 5.0 g/dL   AST 23 15 - 41 U/L   ALT 13 0 - 44 U/L   Alkaline Phosphatase 75 38 - 126 U/L   Total Bilirubin 0.4 0.3 - 1.2 mg/dL   GFR, Estimated >60 >60 mL/min   Anion gap 8 5 - 15    Recent Results (from the past 240 hour(s))  Resp Panel by RT-PCR (Flu A&B, Covid) Nasopharyngeal  Swab     Status: None   Collection Time: 05/11/21 10:58 PM   Specimen: Nasopharyngeal Swab; Nasopharyngeal(NP) swabs in vial transport medium  Result Value Ref Range Status   SARS Coronavirus 2 by RT PCR NEGATIVE NEGATIVE Final    Comment: (NOTE) SARS-CoV-2 target nucleic acids are NOT DETECTED.  The SARS-CoV-2 RNA is generally detectable in upper respiratory specimens during the acute phase of infection. The lowest concentration of SARS-CoV-2 viral copies this assay can detect is 138 copies/mL. A negative result does not preclude SARS-Cov-2 infection and should not be used as the sole basis for treatment or other patient management decisions. A negative result may occur with  improper specimen collection/handling, submission of specimen other than nasopharyngeal swab,  presence of viral mutation(s) within the areas targeted by this assay, and inadequate number of viral copies(<138 copies/mL). A negative result must be combined with clinical observations, patient history, and epidemiological information. The expected result is Negative.  Fact Sheet for Patients:  EntrepreneurPulse.com.au  Fact Sheet for Healthcare Providers:  IncredibleEmployment.be  This test is no t yet approved or cleared by the [place] FDA and  has been authorized for detection and/or diagnosis of SARS-CoV-2 by FDA under an Emergency Use Authorization (EUA). This EUA will remain  in effect (meaning this test can be used) for the duration of the COVID-19 declaration under Section 564(b)(1) of the Act, 21 U.S.C.section 360bbb-3(b)(1), unless the authorization is terminated  or revoked sooner.       Influenza A by PCR NEGATIVE NEGATIVE Final   Influenza B by PCR NEGATIVE NEGATIVE Final    Comment: (NOTE) The Xpert Xpress SARS-CoV-2/FLU/RSV plus assay is intended as an aid in the diagnosis of influenza from Nasopharyngeal swab specimens and should not be used as a sole basis for treatment. Nasal washings and aspirates are unacceptable for Xpert Xpress SARS-CoV-2/FLU/RSV testing.  Fact Sheet for Patients: EntrepreneurPulse.com.au  Fact Sheet for Healthcare Providers: IncredibleEmployment.be  This test is not yet approved or cleared by the [place] FDA and has been authorized for detection and/or diagnosis of SARS-CoV-2 by FDA under an Emergency Use Authorization (EUA). This EUA will remain in effect (meaning this test can be used) for the duration of the COVID-19 declaration under Section 564(b)(1) of the Act, 21 U.S.C. section 360bbb-3(b)(1), unless the authorization is terminated or revoked.  Performed at [facility], [place] [address], [place], [place] [address]       Radiology Studies: CT Angio Chest PE W and/or Wo Contrast  Result Date: 05/11/2021 CLINICAL DATA:  Pulmonary embolus suspected with high probability. Chest pain, muscle pain, and shortness of breath. History of metastatic breast cancer. EXAM: CT ANGIOGRAPHY CHEST WITH CONTRAST TECHNIQUE: Multidetector CT imaging of the chest was performed using the standard protocol during bolus administration of intravenous contrast. Multiplanar CT image reconstructions and MIPs were obtained to evaluate the vascular anatomy. CONTRAST:  64mL OMNIPAQUE IOHEXOL 350 MG/ML SOLN COMPARISON:  03/16/2021 FINDINGS: Cardiovascular: Motion artifact limits the examination. There is good opacification of the central and segmental pulmonary arteries. No focal filling defects. No evidence of significant pulmonary embolus. Normal heart size. Normal caliber thoracic aorta. No evidence of aortic dissection. Great vessel origins are patent. 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 Mediastinum/Nodes: Esophagus is decompressed. No significant lymphadenopathy in the mediastinum. Large lobulated mass demonstrated in the right breast corresponding to known cancer. There is suggestion of a smaller mass in the left breast. Extensive lymphadenopathy in  both axillary regions. Right axillary lymph nodes measure up to about 4.4 cm in diameter. Left axillary lymph nodes measure up to about 3.6 cm in diameter. Mass or lymphadenopathy in the soft tissues anterior to the clavicular heads. Lungs/Pleura: No consolidation, airspace disease, or nodularity demonstrated in the lungs. No pleural effusions. No pneumothorax. Upper Abdomen: No acute abnormalities demonstrated in the visualized upper abdomen. Musculoskeletal: Multiple bone metastases demonstrating bone destruction and soft tissue masses in the midthoracic spine, post fixation, in the manubrium, clavicular heads, and right posterior ribs. Similar appearance  to the previous study. Review of the MIP images confirms the above findings. IMPRESSION: 1. No evidence of significant pulmonary embolus. 2. Lungs are clear. 3. Sequela of metastatic breast cancer including bilateral soft tissue breast masses, greater on the right. Axillary and supraclavicular lymphadenopathy. Multiple bone metastases. Electronically Signed   By: Lucienne Capers M.D.   On: 05/11/2021 22:44   CT Angio Chest PE W and/or Wo Contrast  Final Result      Scheduled Meds:  amLODipine  10 mg Oral Daily   letrozole  2.5 mg Oral Daily   PRN Meds: acetaminophen **OR** acetaminophen, HYDROmorphone (DILAUDID) injection, oxyCODONE, polyethylene glycol Continuous Infusions:  [START ON 05/13/2021] vancomycin       LOS: 1 day  Time spent: Greater than 50% of the 35 minute visit was spent in counseling/coordination of care for the patient as laid out in the A&P.   Dwyane Dee, MD [facility] 05/12/2021, 2:03 PM

## 2021-05-12 NOTE — ED Notes (Signed)
Pt declined MRSA testing

## 2021-05-12 NOTE — ED Notes (Addendum)
Dressing change: Wound on right side of chest cleaned with saline.  3 Non adhesive bandages and 2 abdominal pads applied and secured.

## 2021-05-12 NOTE — Progress Notes (Signed)
Pharmacy Antibiotic Note  Misty Arnold is a 50 y.o. female admitted on 05/11/2021 with cellulitis.  Pharmacy has been consulted for Vancomycin dosing.  Plan: Vancomycin 1gm IV x1 now then 750mg  IV q24h to target AUC 400-550.  Expected AUC 438. Check Vancomycin levels at steady state Monitor renal function and cx data   Height: 5\' 4"  (162.6 cm) Weight: 55.5 kg (122 lb 4 oz) IBW/kg (Calculated) : 54.7  Temp (24hrs), Avg:99.7 F (37.6 C), Min:99.7 F (37.6 C), Max:99.7 F (37.6 C)  Recent Labs  Lab 05/11/21 2124 05/11/21 2322  WBC 2.6*  --   CREATININE 1.24*  --   LATICACIDVEN  --  0.8    Estimated Creatinine Clearance: 46.3 mL/min (A) (by C-G formula based on SCr of 1.24 mg/dL (H)).    Allergies  Allergen Reactions   Amoxicillin     Other reaction(s): rash/itching   Penicillins Rash    Did it involve swelling of the face/tongue/throat, SOB, or low BP? no Did it involve sudden or severe rash/hives, skin peeling, or any reaction on the inside of your mouth or nose? Yes Did you need to seek medical attention at a hospital or doctor's office? No When did it last happen? early 20's     If all above answers are "NO", may proceed with cephalosporin use.   Pork-Derived Products    Other    Penicillin G    Tamoxifen Hypertension    Antimicrobials this admission: 7/8 Vancomycin >>   Dose adjustments this admission:  Microbiology results:  Thank you for allowing pharmacy to be a part of this patient's care.  Netta Cedars PharmD 05/12/2021 1:02 AM

## 2021-05-12 NOTE — ED Notes (Signed)
ED TO INPATIENT HANDOFF REPORT  Name/Age/Gender Misty Arnold 50 y.o. female  Code Status Code Status History    Date Active Date Inactive Code Status Order ID Comments User Context   01/08/2020 1621 01/20/2020 2131 Full Code [id number]  Misty Arnold Inpatient   01/04/2020 1740 01/08/2020 1614 Full Code [id number]  Misty Larsson, MD Inpatient    Questions for Most Recent Historical Code Status (Order [id number])       Home/SNF/Other Home  Chief Complaint AKI (acute kidney injury) ([place]) [N17.9]  Level of Care/Admitting Diagnosis ED Disposition    ED Disposition  Admit   Condition  --   Comment  Hospital Area: [facility] [[id number]]  Level of Care: Telemetry [5]  Admit to tele based on following criteria: Other see comments  Comments: tachycardia  May admit patient to [facility] or [facility] if equivalent level of care is available:: No  Covid Evaluation: Asymptomatic Screening Protocol (No Symptoms)  Diagnosis: AKI (acute kidney injury) [facility]) [[id number]]  Admitting Physician: Toy Baker [[id number]]  Attending Physician: Toy Baker [[id number]]  Estimated length of stay: past midnight tomorrow  Certification:: I certify this patient will need inpatient services for at least 2 midnights         Medical History Past Medical History:  Diagnosis Date  . Abnormal uterine bleeding   . Anemia   . Breast cancer ([place])   . Depression   . Fibroid   . Hormone disorder   . Hypertension   . Infertility, female     Allergies Allergies  Allergen Reactions  . Amoxicillin     Other reaction(s): rash/itching  . Penicillins Rash    Did it involve swelling of the face/tongue/throat, SOB, or low BP? no Did it involve sudden or severe rash/hives, skin peeling, or any reaction on the inside of your mouth or nose? Yes Did you need to seek medical attention at a hospital or doctor's office? No When did it last happen?early 20's If all above  answers are "NO", may proceed with cephalosporin use.  . Pork-Derived Products   . Other   . Penicillin G   . Tamoxifen Hypertension    IV Location/Drains/Wounds Patient Lines/Drains/Airways Status    Active Line/Drains/Airways    Name Placement date Placement time Site Days   Peripheral IV 05/11/21 20 G Left Antecubital 05/11/21  2143  Antecubital  1   Incision (Closed) 01/04/20 Back Other (Comment) 01/04/20  1559  -- 494   Pressure Injury 01/18/20 Coccyx Mid;Anterior Stage 2 -  Partial thickness loss of dermis presenting as a shallow open injury with a red, pink wound bed without slough. 2 small open areas that have 2 small open areas within them. 01/18/20  1830  -- 480   Wound / Incision (Open or Dehisced) 01/04/20 Breast Right 01/04/20  1700  Breast  494          Labs/Imaging Results for orders placed or performed during the hospital encounter of 05/11/21 (from the past 48 hour(s))  Basic metabolic panel     Status: Abnormal   Collection Time: 05/11/21  9:24 PM  Result Value Ref Range   Sodium 142 135 - 145 mmol/L   Potassium 3.5 3.5 - 5.1 mmol/L   Chloride 104 98 - 111 mmol/L   CO2 27 22 - 32 mmol/L   Glucose, Bld 121 (H) 70 - 99 mg/dL    Comment: Glucose reference range applies only to samples taken after fasting for at least 8  hours.   BUN 17 6 - 20 mg/dL   Creatinine, Ser 1.24 (H) 0.44 - 1.00 mg/dL   Calcium 9.4 8.9 - 10.3 mg/dL   GFR, Estimated 53 (L) >60 mL/min    Comment: (NOTE) Calculated using the CKD-EPI Creatinine Equation (2021)    Anion gap 11 5 - 15    Comment: Performed at [facility], [place] [address], [place], [place] [address]  Brain natriuretic peptide     Status: None   Collection Time: 05/11/21  9:24 PM  Result Value Ref Range   B Natriuretic Peptide 70.9 0.0 - 100.0 pg/mL    Comment: Performed at [facility], [place] [address], [place], [place] [address]  CBC with Differential     Status: Abnormal   Collection  Time: 05/11/21  9:24 PM  Result Value Ref Range   WBC 2.6 (L) 4.0 - 10.5 K/uL   RBC 3.85 (L) 3.87 - 5.11 MIL/uL   Hemoglobin 12.7 12.0 - 15.0 g/dL   HCT 37.4 36.0 - 46.0 %   MCV 97.1 80.0 - 100.0 fL   MCH 33.0 26.0 - 34.0 pg   MCHC 34.0 30.0 - 36.0 g/dL   RDW 16.4 (H) 11.5 - 15.5 %   Platelets 350 150 - 400 K/uL   nRBC 0.0 0.0 - 0.2 %   Neutrophils Relative % 49 %   Neutro Abs 1.3 (L) 1.7 - 7.7 K/uL   Lymphocytes Relative 38 %   Lymphs Abs 1.0 0.7 - 4.0 K/uL   Monocytes Relative 8 %   Monocytes Absolute 0.2 0.1 - 1.0 K/uL   Eosinophils Relative 2 %   Eosinophils Absolute 0.1 0.0 - 0.5 K/uL   Basophils Relative 3 %   Basophils Absolute 0.1 0.0 - 0.1 K/uL   Immature Granulocytes 0 %   Abs Immature Granulocytes 0.01 0.00 - 0.07 K/uL   Reactive, Benign Lymphocytes PRESENT     Comment: Performed at [facility], [place] [address], [place], [place] [address]  CK     Status: None   Collection Time: 05/11/21  9:24 PM  Result Value Ref Range   Total CK 90 38 - 234 U/L    Comment: Performed at [facility], [place] [address] [place], [place] [address]  Magnesium     Status: None   Collection Time: 05/11/21  9:24 PM  Result Value Ref Range   Magnesium 2.4 1.7 - 2.4 mg/dL    Comment: Performed at [facility], [place] [address], [place], [place] [address]  Phosphorus     Status: None   Collection Time: 05/11/21  9:24 PM  Result Value Ref Range   Phosphorus 4.1 2.5 - 4.6 mg/dL    Comment: Performed at [facility], [place] [address], [place], [place] [address]  Lactic acid, plasma     Status: None   Collection Time: 05/11/21 11:22 PM  Result Value Ref Range   Lactic Acid, Venous 0.8 0.5 - 1.9 mmol/L    Comment: Performed at [facility], [place] [address] [place], [place] [address]  Urinalysis, Routine w reflex microscopic Urine, Clean Catch     Status: Abnormal   Collection Time: 05/11/21 11:23 PM   Result Value Ref Range   Color, Urine YELLOW YELLOW   APPearance CLEAR CLEAR   Specific Gravity, Urine 1.020 1.005 - 1.030   pH 5.0 5.0 - 8.0   Glucose, UA NEGATIVE NEGATIVE mg/dL   Hgb  urine dipstick NEGATIVE NEGATIVE   Bilirubin Urine NEGATIVE NEGATIVE   Ketones, ur 5 (A) NEGATIVE mg/dL   Protein, ur NEGATIVE NEGATIVE mg/dL   Nitrite NEGATIVE NEGATIVE   Leukocytes,Ua NEGATIVE NEGATIVE    Comment: Performed at [facility], [place] [address] [place], [place] [address]   CT Angio Chest PE W and/or Wo Contrast  Result Date: 05/11/2021 CLINICAL DATA:  Pulmonary embolus suspected with high probability. Chest pain, muscle pain, and shortness of breath. History of metastatic breast cancer. EXAM: CT ANGIOGRAPHY CHEST WITH CONTRAST TECHNIQUE: Multidetector CT imaging of the chest was performed using the standard protocol during bolus administration of intravenous contrast. Multiplanar CT image reconstructions and MIPs were obtained to evaluate the vascular anatomy. CONTRAST:  73mL OMNIPAQUE IOHEXOL 350 MG/ML SOLN COMPARISON:  03/16/2021 FINDINGS: Cardiovascular: Motion artifact limits the examination. There is good opacification of the central and segmental pulmonary arteries. No focal filling defects. No evidence of significant pulmonary embolus. Normal heart size. Normal caliber thoracic aorta. No evidence of aortic dissection. Great vessel origins are patent. 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 Mediastinum/Nodes: Esophagus is decompressed. No significant lymphadenopathy in the mediastinum. Large lobulated mass demonstrated in the right breast corresponding to known cancer. There is suggestion of a smaller mass in the left breast. Extensive lymphadenopathy in both axillary regions. Right axillary lymph nodes measure up to about 4.4 cm in diameter. Left axillary lymph nodes measure up to about 3.6 cm in diameter. Mass or lymphadenopathy  in the soft tissues anterior to the clavicular heads. Lungs/Pleura: No consolidation, airspace disease, or nodularity demonstrated in the lungs. No pleural effusions. No pneumothorax. Upper Abdomen: No acute abnormalities demonstrated in the visualized upper abdomen. Musculoskeletal: Multiple bone metastases demonstrating bone destruction and soft tissue masses in the midthoracic spine, post fixation, in the manubrium, clavicular heads, and right posterior ribs. Similar appearance to the previous study. Review of the MIP images confirms the above findings. IMPRESSION: 1. No evidence of significant pulmonary embolus. 2. Lungs are clear. 3. Sequela of metastatic breast cancer including bilateral soft tissue breast masses, greater on the right. Axillary and supraclavicular lymphadenopathy. Multiple bone metastases. Electronically Signed   By: Lucienne Capers M.D.   On: 05/11/2021 22:44    Pending Labs Unresulted Labs (From admission, onward)    Start     Ordered   05/11/21 2324  Hepatic function panel  Add-on,   AD        05/11/21 2323   05/11/21 2258  Resp Panel by RT-PCR (Flu A&B, Covid) Nasopharyngeal Swab  (Tier 2 - Symptomatic/asymptomatic with Precautions )  Once,   STAT       Question Answer Comment  Is this test for diagnosis or screening Screening   Symptomatic for COVID-19 as defined by CDC No   Hospitalized for COVID-19 No   Admitted to ICU for COVID-19 No   Previously tested for COVID-19 Yes   Resident in a congregate (group) care setting No   Employed in healthcare setting No   Pregnant No   Has patient completed COVID vaccination(s) (2 doses of Pfizer/Moderna 1 dose of [employer]) Unknown      05/11/21 2257   Signed and Held  HIV Antibody (routine testing w rflx)  (HIV Antibody (Routine testing w reflex) panel)  Tomorrow morning,   R        Signed and Held   Signed and Held  Magnesium  Tomorrow morning,   R  Signed and Held   Signed and Held  Phosphorus  Tomorrow  morning,   R        Signed and Held   Signed and Held  CBC WITH DIFFERENTIAL  Tomorrow morning,   R        Signed and Held   Signed and Held  TSH  Tomorrow morning,   R        Signed and Held   Signed and Held  Comprehensive metabolic panel  Tomorrow morning,   R        Signed and Held          Vitals/Pain Today's Vitals   05/11/21 2150 05/11/21 2200 05/11/21 2333 05/12/21 0045  BP:  (!) 142/97    Pulse:  (!) 101    Resp:  (!) 28    Temp:      TempSrc:      SpO2:  98%    Weight:      Height:      PainSc: 1   0-No pain 2     Isolation Precautions No active isolations  Medications Medications  potassium chloride SA (KLOR-CON) CR tablet 40 mEq (has no administration in time range)  morphine 4 MG/ML injection 4 mg (4 mg Intravenous Given 05/11/21 2154)  ondansetron (ZOFRAN) injection 4 mg (4 mg Intravenous Given 05/11/21 2151)  sodium chloride 0.9 % bolus 1,000 mL (1,000 mLs Intravenous Bolus from Bag 05/11/21 2155)  iohexol (OMNIPAQUE) 350 MG/ML injection 80 mL (80 mLs Intravenous Contrast Given 05/11/21 2228)  morphine 4 MG/ML injection 4 mg (4 mg Intravenous Given 05/11/21 2348)    Mobility walks with device (cane)

## 2021-05-12 NOTE — Evaluation (Signed)
Physical Therapy Evaluation Patient Details Name: Misty Arnold MRN: [id number] DOB: 30-Oct-1971 Today's Date: 05/12/2021   History of Present Illness  Misty Arnold is a 50 y.o. female with medical history significant of  breast ca diagnosed in 2011, T8-9 metastatic tumor/S/P screw fixation.HTN,   ambulates with cane,         Presented  05/11/21 with chest pain ,shortness of breath .  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 Patient is very mobilie, Ambulated x 50' with quad cane. Patient is modified independnent PTA. Pt admitted with above diagnosis.  Pt currently with functional limitations due to the deficits listed below (see PT Problem List). Pt will benefit from skilled PT to increase their independence and safety with mobility to allow discharge to the venue listed below.       Follow Up Recommendations No PT follow up    Equipment Recommendations  None recommended by PT    Recommendations for Other Services       Precautions / Restrictions Precautions Precautions: Fall Precaution Comments: Right chest wall wound      Mobility  Bed Mobility Overal bed mobility: Independent                  Transfers Overall transfer level: Modified independent                  Ambulation/Gait Ambulation/Gait assistance: Supervision Gait Distance (Feet): 50 Feet Assistive device: Quad cane Gait Pattern/deviations: Step-through pattern;Decreased stride length;Decreased dorsiflexion - right Gait velocity: decr. Gait velocity interpretation: <1.31 ft/sec, indicative of household ambulator General Gait Details: noted to have a "dip"  to right during stance.  Stairs            Wheelchair Mobility    Modified Rankin (Stroke Patients Only)       Balance Overall balance assessment: Mild deficits observed, not formally tested                                            Pertinent Vitals/Pain Pain Assessment: No/denies pain    Home Living Family/patient expects to be discharged to:: Private residence Living Arrangements: Spouse/significant other;Children Available Help at Discharge: Available 24 hours/day Type of Home: House Home Access: Stairs to enter Entrance Stairs-Rails: None Entrance Stairs-Number of Steps: 2 Home Layout: One level Home Equipment: Environmental consultant - 2 wheels;Cane - quad      Prior Function Level of Independence: Independent with assistive device(s)               Hand Dominance        Extremity/Trunk Assessment   Upper Extremity Assessment Upper Extremity Assessment: Overall WFL for tasks assessed    Lower Extremity Assessment Lower Extremity Assessment: RLE deficits/detail;LLE deficits/detail RLE Deficits / Details: grossly 4/5,3/5 dorsiflexion, knee hyperextends at stance at times, decreased LT LLE Deficits / Details: grossly 4+/5 throughout    Cervical / Trunk Assessment Cervical / Trunk Assessment: Normal  Communication   Communication: No difficulties  Cognition Arousal/Alertness: Awake/alert Behavior During Therapy: WFL for tasks assessed/performed Overall Cognitive Status: Within Functional Limits for tasks assessed  General Comments      Exercises     Assessment/Plan    PT Assessment Patient needs continued PT services  PT Problem List Decreased strength;Decreased knowledge of precautions;Impaired sensation;Decreased balance       PT Treatment Interventions Therapeutic activities;Gait training;Patient/family education;Functional mobility training    PT Goals (Current goals can be found in the Care Plan section)  Acute Rehab PT Goals Patient Stated Goal: i want to do therapy. get stonger PT Goal Formulation: With patient Time For Goal Achievement: 05/26/21 Potential to Achieve Goals: Good    Frequency Min  2X/week   Barriers to discharge        Co-evaluation               AM-PAC PT "6 Clicks" Mobility  Outcome Measure Help needed turning from your back to your side while in a flat bed without using bedrails?: None Help needed moving from lying on your back to sitting on the side of a flat bed without using bedrails?: None Help needed moving to and from a bed to a chair (including a wheelchair)?: None Help needed standing up from a chair using your arms (e.g., wheelchair or bedside chair)?: None Help needed to walk in hospital room?: A Little Help needed climbing 3-5 steps with a railing? : A Little 6 Click Score: 22    End of Session   Activity Tolerance: Patient tolerated treatment well Patient left: in bed;with call bell/phone within reach Nurse Communication: Mobility status PT Visit Diagnosis: Muscle weakness (generalized) (M62.81);Unsteadiness on feet (R26.81)    Time: 2417-5301 PT Time Calculation (min) (ACUTE ONLY): 24 min   Charges:   PT Evaluation $PT Eval Low Complexity: 1 Low PT Treatments $Gait Training: 8-22 mins        Tresa Endo PT Acute Rehabilitation Services Pager [phone] Office [phone]   Claretha Cooper 05/12/2021, 12:17 PM

## 2021-05-12 NOTE — Consult Note (Signed)
WOC Nurse Consult Note: Patient receiving care in [facility] ED 21. Reason for Consult: chest wall wound Wound type: metastatic breast cancer with fungating tumor to right chest wall. Pressure Injury POA: Yes/No/NA Measurement: see photos Wound bed: see photos Drainage (amount, consistency, odor) small amount seen on dressings in photo Periwound: intact Dressing procedure/placement/frequency:  Gently cleanse right chest wound with saline, pat dry. Apply Aquacel Kellie Simmering [id number]) over the fungating tumor, then ABD pads, tape in place. [facility] nurse will not follow at this time.  Please re-consult the [place] team if needed.  Val Riles, RN, MSN, CWOCN, CNS-BC, pager [phone]

## 2021-05-13 ENCOUNTER — Other Ambulatory Visit (HOSPITAL_COMMUNITY): Payer: Self-pay

## 2021-05-13 LAB — CBC WITH DIFFERENTIAL/PLATELET
Abs Immature Granulocytes: 0 10*3/uL (ref 0.00–0.07)
Basophils Absolute: 0.1 10*3/uL (ref 0.0–0.1)
Basophils Relative: 3 %
Eosinophils Absolute: 0 10*3/uL (ref 0.0–0.5)
Eosinophils Relative: 2 %
HCT: 34 % — ABNORMAL LOW (ref 36.0–46.0)
Hemoglobin: 11.5 g/dL — ABNORMAL LOW (ref 12.0–15.0)
Immature Granulocytes: 0 %
Lymphocytes Relative: 43 %
Lymphs Abs: 0.8 10*3/uL (ref 0.7–4.0)
MCH: 33.2 pg (ref 26.0–34.0)
MCHC: 33.8 g/dL (ref 30.0–36.0)
MCV: 98.3 fL (ref 80.0–100.0)
Monocytes Absolute: 0.2 10*3/uL (ref 0.1–1.0)
Monocytes Relative: 8 %
Neutro Abs: 0.9 10*3/uL — ABNORMAL LOW (ref 1.7–7.7)
Neutrophils Relative %: 44 %
Platelets: 322 10*3/uL (ref 150–400)
RBC: 3.46 MIL/uL — ABNORMAL LOW (ref 3.87–5.11)
RDW: 16.1 % — ABNORMAL HIGH (ref 11.5–15.5)
WBC: 1.9 10*3/uL — ABNORMAL LOW (ref 4.0–10.5)
nRBC: 0 % (ref 0.0–0.2)

## 2021-05-13 LAB — BASIC METABOLIC PANEL
Anion gap: 8 (ref 5–15)
BUN: 17 mg/dL (ref 6–20)
CO2: 26 mmol/L (ref 22–32)
Calcium: 9.4 mg/dL (ref 8.9–10.3)
Chloride: 103 mmol/L (ref 98–111)
Creatinine, Ser: 1.02 mg/dL — ABNORMAL HIGH (ref 0.44–1.00)
GFR, Estimated: >60 mL/min (ref >60)
Glucose, Bld: 102 mg/dL — ABNORMAL HIGH (ref 70–99)
Potassium: 4.1 mmol/L (ref 3.5–5.1)
Sodium: 137 mmol/L (ref 135–145)

## 2021-05-13 LAB — MAGNESIUM: Magnesium: 2.3 mg/dL (ref 1.7–2.4)

## 2021-05-13 MED ORDER — HYDROMORPHONE HCL 1 MG/ML IJ SOLN: 1.0000 mg | INTRAMUSCULAR | Status: DC | PRN

## 2021-05-13 MED ORDER — AMLODIPINE BESYLATE 10 MG PO TABS
10.0000 mg | ORAL_TABLET | Freq: Every day | ORAL | 3 refills | Status: DC
  Filled 2021-05-13: qty 90, 90d supply, fill #0

## 2021-05-13 MED ORDER — OXYCODONE HCL 5 MG PO TABS
10.0000 mg | ORAL_TABLET | ORAL | Status: DC | PRN
  Administered 2021-05-13 (×2): 10 mg via ORAL
  Filled 2021-05-13 (×2): qty 2

## 2021-05-13 MED ORDER — OXYCODONE HCL 10 MG PO TABS
10.0000 mg | ORAL_TABLET | ORAL | 0 refills | Status: DC | PRN
  Filled 2021-05-13: qty 30, 4d supply, fill #0

## 2021-05-13 NOTE — Evaluation (Signed)
Occupational Therapy Evaluation Patient Details Name: Misty Arnold MRN: [id number] DOB: 05/17/1971 Today's Date: 05/13/2021    History of Present Illness Misty Arnold is a 50 y.o. female with medical history significant of  breast ca diagnosed in 2011, T8-9 metastatic tumor/S/P screw fixation.HTN,   ambulates with cane,         Presented  05/11/21 with chest pain ,shortness of breath .  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 All education has been completed and the patient has no further questions.  See below for any follow-up Occupational Therapy or equipment needs. OT is signing off. Thank you for this referral.     Follow Up Recommendations  No OT follow up    Equipment Recommendations  None recommended by OT    Recommendations for Other Services       Precautions / Restrictions Precautions Precautions: Fall Precaution Comments: Right chest wall wound Restrictions Weight Bearing Restrictions: No      Mobility Bed Mobility Overal bed mobility: Independent                  Transfers Overall transfer level: Modified independent                    Balance Overall balance assessment: Mild deficits observed, not formally tested                                         ADL either performed or assessed with clinical judgement   ADL Overall ADL's : At baseline                                       General ADL Comments: Pt able to demonstrate Indepednent bed mobility, Mod I sit<>stand with pt's own Quad cane, functional bathroom mobility including toilet transfer to standard height, toileting, LE dressing and standing at sink for grooming all Mod I/baseline.     Vision   Vision Assessment?: No apparent visual deficits     Perception     Praxis      Pertinent Vitals/Pain Pain  Assessment: No/denies pain     Hand Dominance Right   Extremity/Trunk Assessment Upper Extremity Assessment Upper Extremity Assessment: Overall WFL for tasks assessed   Lower Extremity Assessment Lower Extremity Assessment: Defer to PT evaluation   Cervical / Trunk Assessment Cervical / Trunk Assessment: Normal   Communication Communication Communication: No difficulties   Cognition Arousal/Alertness: Awake/alert Behavior During Therapy: WFL for tasks assessed/performed Overall Cognitive Status: Within Functional Limits for tasks assessed                                     General Comments       Exercises     Shoulder Instructions      Home Living Family/patient expects to be discharged to:: Private residence Living Arrangements: Spouse/significant other;Children Available Help at Discharge: Available 24 hours/day Type of Home: House Home Access: Stairs to enter [employer] of Steps: 2 Entrance Stairs-Rails: None Home Layout: One level     Bathroom Shower/Tub: Occupational psychologist: Handicapped height  Home Equipment: [place] - 2 wheels;[place] held shower head;Bedside commode;Shower seat;Wheelchair - manual          Prior Functioning/Environment Level of Independence: Independent with assistive device(s)                 OT Problem List: Decreased activity tolerance;Pain      OT Treatment/Interventions:      OT Goals(Current goals can be found in the care plan section) Acute Rehab OT Goals Patient Stated Goal: Increase strength OT Goal Formulation: With patient Potential to Achieve Goals: Good  OT Frequency:     Barriers to D/C:            Co-evaluation              AM-PAC OT "6 Clicks" Daily Activity     Outcome Measure Help from another person eating meals?: None Help from another person taking care of personal grooming?: None Help from another person toileting, which includes  using toliet, bedpan, or urinal?: None Help from another person bathing (including washing, rinsing, drying)?: None Help from another person to put on and taking off regular upper body clothing?: None Help from another person to put on and taking off regular lower body clothing?: None 6 Click Score: 24   End of Session Equipment Utilized During Treatment: Other (comment) (Quad cane (pt's own))  Activity Tolerance: Patient tolerated treatment well Patient left: in chair;with call bell/phone within reach  OT Visit Diagnosis: Pain Pain - Right/Left: Right Pain - part of body:  (breast wound)                Time: 3300-7622 OT Time Calculation (min): 12 min Charges:  OT General Charges $OT Visit: 1 Visit OT Evaluation $OT Eval Low Complexity: 1 Low  Misty Arnold, [place] Acute Rehab Services Office: [phone] 05/13/2021  [name] 05/13/2021, 9:38 AM

## 2021-05-13 NOTE — Discharge Summary (Signed)
[facility] Physician Discharge Summary   Misty Arnold AYT:[id number] DOB: April 13, 1971 DOA: 05/11/2021  PCP: Nicholas Lose, MD  Admit date: 05/11/2021 Discharge date:  05/13/2021  Admitted From: home Disposition:  home Discharging physician: Dwyane Dee, MD  Recommendations for Outpatient Follow-up:  Recommend referral to palliative care outpatient to initiate some goals of care.  Patient exhibiting large amounts of bargaining with her diagnosis and there may still be some components of denial in regards to her prognosis, despite length of history of diagnosis  Home Health:  Equipment/Devices:   Patient discharged to home in Discharge Condition: stable Risk of unplanned readmission score: Unplanned Admission- Pilot do not use: 14.37  CODE STATUS: Full Diet recommendation:  Diet Orders (From admission, onward)     Start     Ordered   05/13/21 0000  Diet - low sodium heart healthy        05/13/21 1000   05/12/21 0136  Diet Heart Room service appropriate? Yes; Fluid consistency: Thin  Diet effective now       Question Answer Comment  Room service appropriate? Yes   Fluid consistency: Thin      05/12/21 0135            Hospital Course:  Ms. 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 She has had ongoing difficulty with evolution of her fungating right breast mass.  She has been under care of oncology since diagnosis in 2011.  She has declined palliative care/hospice recommendations recently. CTA was obtained on admission which was negative for PE and showed ongoing sequela of her metastatic breast cancer including bilateral soft tissue breast masses, greater on the right.  Also noted her axillary and supraclavicular lymphadenopathy along with multiple bony metastases.  AKI - baseline creatinine ~ 0.8 - patient presents with increase in creat >0.3 mg/dL above  baseline, creat increase >1.5x baseline presumed to have occurred within past 7 days PTA - creat 1.24 on admission - improved with IVF -Ongoing nutrition /oral intake encouraged and recommended at discharge   Stage IV Breast cancer - follows with Dr. Lindi Adie outpatient - dx 2011. 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  On day of discharge, patient was felt deemed stable for discharge. Patient/family member advised to call PCP or come back to ER if needed.   Principal Diagnosis: <principal problem not specified>  Discharge Diagnoses: Active Hospital Problems   Diagnosis Date Noted   Wound infection 05/12/2021   Cancer related pain 05/12/2021   AKI (acute kidney injury) ([place]) 05/11/2021   Weakness of both legs    Metastatic breast cancer [facility])     Resolved Hospital Problems  No resolved problems to display.    Discharge Instructions  Diet - low sodium heart healthy   Complete by: As directed    Discharge wound care:   Complete by: As directed    Cleanse chest wound with saline, pat dry. Apply aquacel dressing over wound, then ABD pads, then tape in place.   Increase activity slowly   Complete by: As directed       Allergies as of 05/13/2021       Reactions   Amoxicillin    Other reaction(s):  rash/itching   Penicillins Rash   Did it involve swelling of the face/tongue/throat, SOB, or low BP? no Did it involve sudden or severe rash/hives, skin peeling, or any reaction on the inside of your mouth or nose? Yes Did you need to seek medical attention at a hospital or doctor's office? No When did it last happen? early 20's     If all above answers are "NO", may proceed with cephalosporin use.   Pork-derived Products    Penicillin G    Tamoxifen Hypertension        Medication List     STOP taking these medications    sucralfate 1 g tablet Commonly known as: Carafate       TAKE these medications    acetaminophen 325 MG tablet Commonly known as: TYLENOL Take 2 tablets (650 mg total) by mouth every 4 (four) hours as needed for mild pain ((score 1 to 3) or temp > 100.5).   amLODipine 10 MG tablet Commonly known as: NORVASC Take 1 tablet (10 mg total) by mouth daily.   bisacodyl 10 MG suppository Commonly known as: DULCOLAX Place 1 suppository (10 mg total) rectally daily as needed for moderate constipation.   BLACK CURRANT SEED OIL PO Take by mouth.   [employer] D3 125 MCG (5000 UT) capsule Generic drug: Cholecalciferol Take 1 capsule (5,000 Units total) by mouth 2 (two) times daily.   diazepam 5 MG tablet Commonly known as: VALIUM Take 1-2 tablets (5-10 mg total) by mouth every 6 (six) hours as needed for muscle spasms.   ESTER C PO Take 1 tablet by mouth with breakfast, with lunch, and with evening meal.   Flaxseed Oil Oil Take 5 mLs by mouth in the morning, at noon, and at bedtime.   goserelin 3.6 MG injection Commonly known as: ZOLADEX Inject 3.6 mg into the skin every 28 (twenty-eight) days.   Ibrance 125 MG tablet Generic drug: palbociclib TAKE 1 TABLET (125 MG TOTAL) BY MOUTH DAILY. TAKE FOR 21 DAYS ON, 7 DAYS OFF, REPEAT EVERY 28 DAYS.   letrozole 2.5 MG tablet Commonly known as: FEMARA TAKE 1 TABLET BY MOUTH EVERY DAY   lidocaine-prilocaine  cream Commonly known as: EMLA APPLY TOPICALLY AS NEEDED.   Oxycodone HCl 10 MG Tabs Take 1 tablet (10 mg total) by mouth every 3 (three) hours as needed. What changed: reasons to take this   [place] Calcium w/D 500-200 MG-UNIT Tabs TAKE 1 TABLET BY MOUTH TWICE A DAY WITH MEALS   polyethylene glycol 17 g packet Commonly known as: MIRALAX / GLYCOLAX Take 17 g by mouth daily as needed for mild constipation.   VITAMIN C PO Take by mouth.               Discharge Care Instructions  (From admission, onward)           Start     Ordered   05/13/21 0000  Discharge wound care:       Comments: Cleanse chest wound with saline,  pat dry. Apply aquacel dressing over wound, then ABD pads, then tape in place.   05/13/21 1000            Allergies  Allergen Reactions   Amoxicillin     Other reaction(s): rash/itching   Penicillins Rash    Did it involve swelling of the face/tongue/throat, SOB, or low BP? no Did it involve sudden or severe rash/hives, skin peeling, or any reaction on the inside of your mouth or nose? Yes Did you need to seek medical attention at a hospital or doctor's office? No When did it last happen? early 20's     If all above answers are "NO", may proceed with cephalosporin use.   Pork-Derived Products    Penicillin G    Tamoxifen Hypertension    Consultations: Oncology  Discharge Exam: BP 118/76 (BP Location: Right Arm)   Pulse (!) 107   Temp 97.7 F (36.5 C) (Oral)   Resp 18   Ht 5\' 4"  (1.626 m)   Wt 55.5 kg   SpO2 100%   BMI 20.98 kg/m  General appearance: alert, cooperative, fatigued, and no distress Head: Normocephalic, without obvious abnormality, atraumatic Eyes:  EOMI Lungs: clear to auscultation bilaterally Heart: regular rate and rhythm and S1, S2 normal Chest: see media tab pic; large fungating lesion noted along right chest wall; unable to express purulence; mass is firm/hard Abdomen: normal findings: bowel sounds normal  and soft, non-tender Extremities:  no edema Skin: mobility and turgor normal Neurologic: moves all 4 extremities  The results of significant diagnostics from this hospitalization (including imaging, microbiology, ancillary and laboratory) are listed below for reference.   Microbiology: Recent Results (from the past 240 hour(s))  Resp Panel by RT-PCR (Flu A&B, Covid) Nasopharyngeal Swab     Status: None   Collection Time: 05/11/21 10:58 PM   Specimen: Nasopharyngeal Swab; Nasopharyngeal(NP) swabs in vial transport medium  Result Value Ref Range Status   SARS Coronavirus 2 by RT PCR NEGATIVE NEGATIVE Final    Comment: (NOTE) SARS-CoV-2 target nucleic acids are NOT DETECTED.  The SARS-CoV-2 RNA is generally detectable in upper respiratory specimens during the acute phase of infection. The lowest concentration of SARS-CoV-2 viral copies this assay can detect is 138 copies/mL. A negative result does not preclude SARS-Cov-2 infection and should not be used as the sole basis for treatment or other patient management decisions. A negative result may occur with  improper specimen collection/handling, submission of specimen other than nasopharyngeal swab, presence of viral mutation(s) within the areas targeted by this assay, and inadequate number of viral copies(<138 copies/mL). A negative result must be combined with clinical observations, patient history, and epidemiological information. The expected result is Negative.  Fact Sheet for Patients:  EntrepreneurPulse.com.au  Fact Sheet for Healthcare Providers:  IncredibleEmployment.be  This test is no t yet approved or cleared by the [place] FDA and  has been authorized for detection and/or diagnosis of SARS-CoV-2 by FDA under an Emergency Use Authorization (EUA). This EUA will remain  in effect (meaning this test can be used) for the duration of the COVID-19 declaration under Section 564(b)(1) of  the Act, 21 U.S.C.section 360bbb-3(b)(1), unless the authorization is terminated  or revoked sooner.       Influenza A by PCR NEGATIVE NEGATIVE Final   Influenza B by PCR NEGATIVE NEGATIVE Final    Comment: (NOTE) The Xpert Xpress SARS-CoV-2/FLU/RSV plus assay is intended as an aid in the diagnosis of influenza from Nasopharyngeal swab specimens and should  not be used as a sole basis for treatment. Nasal washings and aspirates are unacceptable for Xpert Xpress SARS-CoV-2/FLU/RSV testing.  Fact Sheet for Patients: EntrepreneurPulse.com.au  Fact Sheet for Healthcare Providers: IncredibleEmployment.be  This test is not yet approved or cleared by the [place] FDA and has been authorized for detection and/or diagnosis of SARS-CoV-2 by FDA under an Emergency Use Authorization (EUA). This EUA will remain in effect (meaning this test can be used) for the duration of the COVID-19 declaration under Section 564(b)(1) of the Act, 21 U.S.C. section 360bbb-3(b)(1), unless the authorization is terminated or revoked.  Performed at [facility], [place] [address], [place], [place] [address]      Labs: BNP (last 3 results) Recent Labs    05/11/21 2124  BNP 09.3   Basic Metabolic Panel: Recent Labs  Lab 05/11/21 2124 05/12/21 0443 05/13/21 0541  NA 142 142 137  K 3.5 3.9 4.1  CL 104 107 103  CO2 27 27 26   GLUCOSE 121* 115* 102*  BUN 17 13 17   CREATININE 1.24* 0.91 1.02*  CALCIUM 9.4 9.2 9.4  MG 2.4 2.4 2.3  PHOS 4.1 3.5  --    Liver Function Tests: Recent Labs  Lab 05/12/21 0443  AST 23  ALT 13  ALKPHOS 75  BILITOT 0.4  PROT 7.0  ALBUMIN 4.0   No results for input(s): LIPASE, AMYLASE in the last 168 hours. No results for input(s): AMMONIA in the last 168 hours. CBC: Recent Labs  Lab 05/11/21 2124 05/12/21 0443 05/13/21 0541  WBC 2.6* 2.2* 1.9*  NEUTROABS 1.3* 1.0* 0.9*  HGB 12.7 12.2 11.5*  HCT 37.4  36.0 34.0*  MCV 97.1 98.1 98.3  PLT 350 327 322   Cardiac Enzymes: Recent Labs  Lab 05/11/21 2124  CKTOTAL 90   BNP: Invalid input(s): POCBNP CBG: No results for input(s): GLUCAP in the last 168 hours. D-Dimer No results for input(s): DDIMER in the last 72 hours. Hgb A1c No results for input(s): HGBA1C in the last 72 hours. Lipid Profile No results for input(s): CHOL, HDL, LDLCALC, TRIG, CHOLHDL, LDLDIRECT in the last 72 hours. Thyroid function studies Recent Labs    05/12/21 0443  TSH 3.520   Anemia work up No results for input(s): VITAMINB12, FOLATE, FERRITIN, TIBC, IRON, RETICCTPCT in the last 72 hours. Urinalysis    Component Value Date/Time   COLORURINE YELLOW 05/11/2021 2323   APPEARANCEUR CLEAR 05/11/2021 2323   LABSPEC 1.020 05/11/2021 2323   PHURINE 5.0 05/11/2021 2323   GLUCOSEU NEGATIVE 05/11/2021 2323   HGBUR NEGATIVE 05/11/2021 2323   BILIRUBINUR NEGATIVE 05/11/2021 2323   KETONESUR 5 (A) 05/11/2021 2323   PROTEINUR NEGATIVE 05/11/2021 2323   NITRITE NEGATIVE 05/11/2021 2323   LEUKOCYTESUR NEGATIVE 05/11/2021 2323   Sepsis Labs Invalid input(s): PROCALCITONIN,  WBC,  LACTICIDVEN Microbiology Recent Results (from the past 240 hour(s))  Resp Panel by RT-PCR (Flu A&B, Covid) Nasopharyngeal Swab     Status: None   Collection Time: 05/11/21 10:58 PM   Specimen: Nasopharyngeal Swab; Nasopharyngeal(NP) swabs in vial transport medium  Result Value Ref Range Status   SARS Coronavirus 2 by RT PCR NEGATIVE NEGATIVE Final    Comment: (NOTE) SARS-CoV-2 target nucleic acids are NOT DETECTED.  The SARS-CoV-2 RNA is generally detectable in upper respiratory specimens during the acute phase of infection. The lowest concentration of SARS-CoV-2 viral copies this assay can detect is 138 copies/mL. A negative result does not preclude SARS-Cov-2 infection and should not be used as  the sole basis for treatment or other patient management decisions. A negative result  may occur with  improper specimen collection/handling, submission of specimen other than nasopharyngeal swab, presence of viral mutation(s) within the areas targeted by this assay, and inadequate number of viral copies(<138 copies/mL). A negative result must be combined with clinical observations, patient history, and epidemiological information. The expected result is Negative.  Fact Sheet for Patients:  EntrepreneurPulse.com.au  Fact Sheet for Healthcare Providers:  IncredibleEmployment.be  This test is no t yet approved or cleared by the [place] FDA and  has been authorized for detection and/or diagnosis of SARS-CoV-2 by FDA under an Emergency Use Authorization (EUA). This EUA will remain  in effect (meaning this test can be used) for the duration of the COVID-19 declaration under Section 564(b)(1) of the Act, 21 U.S.C.section 360bbb-3(b)(1), unless the authorization is terminated  or revoked sooner.       Influenza A by PCR NEGATIVE NEGATIVE Final   Influenza B by PCR NEGATIVE NEGATIVE Final    Comment: (NOTE) The Xpert Xpress SARS-CoV-2/FLU/RSV plus assay is intended as an aid in the diagnosis of influenza from Nasopharyngeal swab specimens and should not be used as a sole basis for treatment. Nasal washings and aspirates are unacceptable for Xpert Xpress SARS-CoV-2/FLU/RSV testing.  Fact Sheet for Patients: EntrepreneurPulse.com.au  Fact Sheet for Healthcare Providers: IncredibleEmployment.be  This test is not yet approved or cleared by the [place] FDA and has been authorized for detection and/or diagnosis of SARS-CoV-2 by FDA under an Emergency Use Authorization (EUA). This EUA will remain in effect (meaning this test can be used) for the duration of the COVID-19 declaration under Section 564(b)(1) of the Act, 21 U.S.C. section 360bbb-3(b)(1), unless the authorization is terminated  or revoked.  Performed at [facility], [place] [address] [place], [place] [address]     Procedures/Studies: CT Angio Chest PE W and/or Wo Contrast  Result Date: 05/11/2021 CLINICAL DATA:  Pulmonary embolus suspected with high probability. Chest pain, muscle pain, and shortness of breath. History of metastatic breast cancer. EXAM: CT ANGIOGRAPHY CHEST WITH CONTRAST TECHNIQUE: Multidetector CT imaging of the chest was performed using the standard protocol during bolus administration of intravenous contrast. Multiplanar CT image reconstructions and MIPs were obtained to evaluate the vascular anatomy. CONTRAST:  37mL OMNIPAQUE IOHEXOL 350 MG/ML SOLN COMPARISON:  03/16/2021 FINDINGS: Cardiovascular: Motion artifact limits the examination. There is good opacification of the central and segmental pulmonary arteries. No focal filling defects. No evidence of significant pulmonary embolus. Normal heart size. Normal caliber thoracic aorta. No evidence of aortic dissection. Great vessel origins are patent. 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 Mediastinum/Nodes: Esophagus is decompressed. No significant lymphadenopathy in the mediastinum. Large lobulated mass demonstrated in the right breast corresponding to known cancer. There is suggestion of a smaller mass in the left breast. Extensive lymphadenopathy in both axillary regions. Right axillary lymph nodes measure up to about 4.4 cm in diameter. Left axillary lymph nodes measure up to about 3.6 cm in diameter. Mass or lymphadenopathy in the soft tissues anterior to the clavicular heads. Lungs/Pleura: No consolidation, airspace disease, or nodularity demonstrated in the lungs. No pleural effusions. No pneumothorax. Upper Abdomen: No acute abnormalities demonstrated in the visualized upper abdomen. Musculoskeletal: Multiple bone metastases demonstrating bone destruction and soft tissue masses in the  midthoracic spine, post fixation, in the manubrium, clavicular heads, and right posterior ribs. Similar appearance to the previous study. Review of  the MIP images confirms the above findings. IMPRESSION: 1. No evidence of significant pulmonary embolus. 2. Lungs are clear. 3. Sequela of metastatic breast cancer including bilateral soft tissue breast masses, greater on the right. Axillary and supraclavicular lymphadenopathy. Multiple bone metastases. Electronically Signed   By: Lucienne Capers M.D.   On: 05/11/2021 22:44     Time coordinating discharge: Over 30 minutes    Dwyane Dee, MD  [facility] 05/13/2021, 3:49 PM

## 2021-05-15 ENCOUNTER — Telehealth: Payer: Self-pay | Admitting: Hematology and Oncology

## 2021-05-15 ENCOUNTER — Telehealth: Payer: Self-pay | Admitting: *Deleted

## 2021-05-15 ENCOUNTER — Encounter: Payer: Self-pay | Admitting: Hematology and Oncology

## 2021-05-15 NOTE — Telephone Encounter (Signed)
Pt states she was discharged from hospital on Saturday. Is going to send Dr Lindi Adie a link on a new trial medication. States she has new bone cancer.   Message to scheduler for an appt this week or next week.

## 2021-05-15 NOTE — Telephone Encounter (Signed)
Scheduled appointment per 07/11 sch msg. Left message.

## 2021-05-16 ENCOUNTER — Encounter: Payer: Self-pay | Admitting: Hematology and Oncology

## 2021-05-16 ENCOUNTER — Other Ambulatory Visit: Payer: Self-pay | Admitting: *Deleted

## 2021-05-16 ENCOUNTER — Telehealth: Payer: Self-pay | Admitting: *Deleted

## 2021-05-16 DIAGNOSIS — C50411 Malignant neoplasm of upper-outer quadrant of right female breast: Secondary | ICD-10-CM

## 2021-05-16 DIAGNOSIS — Z170 Estrogen receptor positive status [ER+]: Secondary | ICD-10-CM

## 2021-05-16 NOTE — Telephone Encounter (Signed)
Pt requesting to continue to be seen by [facility] for breast wound management post discharge.   Referral faxed to [facility] for approval. [facility] provided care for patient [facility]) [phone]

## 2021-05-16 NOTE — Progress Notes (Signed)
No note

## 2021-05-17 ENCOUNTER — Telehealth: Payer: Self-pay

## 2021-05-17 NOTE — Telephone Encounter (Signed)
RN left message for patient to call back.   Re: MD recommendations for Guardant 360.

## 2021-05-22 ENCOUNTER — Inpatient Hospital Stay: Payer: Medicare PPO | Attending: Hematology and Oncology

## 2021-05-22 ENCOUNTER — Other Ambulatory Visit (HOSPITAL_COMMUNITY): Payer: Self-pay

## 2021-05-22 ENCOUNTER — Ambulatory Visit: Payer: Medicare PPO

## 2021-05-22 DIAGNOSIS — C50411 Malignant neoplasm of upper-outer quadrant of right female breast: Secondary | ICD-10-CM | POA: Insufficient documentation

## 2021-05-22 DIAGNOSIS — Z923 Personal history of irradiation: Secondary | ICD-10-CM | POA: Insufficient documentation

## 2021-05-22 DIAGNOSIS — Z9011 Acquired absence of right breast and nipple: Secondary | ICD-10-CM | POA: Insufficient documentation

## 2021-05-22 DIAGNOSIS — C7951 Secondary malignant neoplasm of bone: Secondary | ICD-10-CM | POA: Insufficient documentation

## 2021-05-22 DIAGNOSIS — Z170 Estrogen receptor positive status [ER+]: Secondary | ICD-10-CM | POA: Insufficient documentation

## 2021-05-22 DIAGNOSIS — E538 Deficiency of other specified B group vitamins: Secondary | ICD-10-CM | POA: Insufficient documentation

## 2021-05-22 DIAGNOSIS — Z79811 Long term (current) use of aromatase inhibitors: Secondary | ICD-10-CM | POA: Insufficient documentation

## 2021-05-22 DIAGNOSIS — Z79899 Other long term (current) drug therapy: Secondary | ICD-10-CM | POA: Insufficient documentation

## 2021-05-22 DIAGNOSIS — C779 Secondary and unspecified malignant neoplasm of lymph node, unspecified: Secondary | ICD-10-CM | POA: Insufficient documentation

## 2021-05-23 DIAGNOSIS — C50411 Malignant neoplasm of upper-outer quadrant of right female breast: Secondary | ICD-10-CM | POA: Diagnosis not present

## 2021-05-23 DIAGNOSIS — I10 Essential (primary) hypertension: Secondary | ICD-10-CM | POA: Diagnosis not present

## 2021-05-23 DIAGNOSIS — Z4800 Encounter for change or removal of nonsurgical wound dressing: Secondary | ICD-10-CM | POA: Diagnosis not present

## 2021-05-23 DIAGNOSIS — Z9011 Acquired absence of right breast and nipple: Secondary | ICD-10-CM | POA: Diagnosis not present

## 2021-05-23 DIAGNOSIS — Z170 Estrogen receptor positive status [ER+]: Secondary | ICD-10-CM | POA: Diagnosis not present

## 2021-05-23 DIAGNOSIS — C7951 Secondary malignant neoplasm of bone: Secondary | ICD-10-CM | POA: Diagnosis not present

## 2021-05-24 ENCOUNTER — Telehealth: Payer: Self-pay

## 2021-05-24 ENCOUNTER — Other Ambulatory Visit: Payer: Self-pay

## 2021-05-24 DIAGNOSIS — C50919 Malignant neoplasm of unspecified site of unspecified female breast: Secondary | ICD-10-CM

## 2021-05-24 DIAGNOSIS — C7949 Secondary malignant neoplasm of other parts of nervous system: Secondary | ICD-10-CM

## 2021-05-24 DIAGNOSIS — Z170 Estrogen receptor positive status [ER+]: Secondary | ICD-10-CM

## 2021-05-24 DIAGNOSIS — C50411 Malignant neoplasm of upper-outer quadrant of right female breast: Secondary | ICD-10-CM

## 2021-05-24 NOTE — Telephone Encounter (Signed)
Patient inquiring to see if Dr. Lindi Adie would review new drug trial medication called Jemperli.   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    RN notified patient, patient agreeable.  Pt is scheduled for lab on 7/22 - orders added.

## 2021-05-25 NOTE — Progress Notes (Signed)
Patient Care Team: Nicholas Lose, MD as PCP - General (Hematology and Oncology)  DIAGNOSIS:    ICD-10-CM   1. Malignant neoplasm of upper-outer quadrant of right breast in female, estrogen receptor positive ([place])  C50.411 SCHEDULING COMMUNICATION INJECTION   Z17.0 Treatment conditions    goserelin (ZOLADEX) injection 3.6 mg    Treatment conditions    SCHEDULING COMMUNICATION INJECTION    Treatment conditions    cyanocobalamin ((VITAMIN B-12)) injection 1,000 mcg    Treatment conditions      SUMMARY OF ONCOLOGIC HISTORY: Oncology History  Breast cancer of upper-outer quadrant of right female breast ([place])  09/21/2010 Mammogram   right breast linear and segmental pleomorphic calcifications from 4:00 to 6:00 position ultrasound revealed 1.6 cm and 1.1 cm masses    09/27/2010 Initial Biopsy   ultrasound-guided biopsy of all masses showed DCIS grade 2; patient went to cancer treatment centers of [place] for second opinion and delayed therapy    01/26/2011 Surgery   right mastectomy followed by reconstruction: Invasive ductal carcinoma T1 N1 MIC M0 stage IB ER/PR positive HER-2 negative, BRCA negative, Oncotype DX low risk    05/29/2012 Procedure   right chest wall nodule excision done on 06/24/2012 showed metastatic carcinoma margins were positive, but CT scan no metastatic disease, recommended chemotherapy but patient refused also refused reexcision    04/28/2013 Treatment Plan Change   patient went to [place] for alternative treatments and took herbal medications, tonics etc. But she could not afford these trips.    01/08/2014 Breast MRI   right breast multiple enhancing masses within the soft tissues largest 5.6 cm in wall skin surface and the capsule of the silicone prosthesis, contiguous nodules involving in inferomedial breast and tired and subcentimeter nodules across the midline    08/29/2015 Surgery   Right mastectomy: IDC with invol of skin and skin ulceration, breast  capsule inv cancer, superior medial margin positive, 1/1 LN positive, grade 3, 14.3 cm, 3.5 cm, ALI, chest wall involv, ER 90-100%, PR 80-90%, HER-2 negative, Ki 67 40-50% T4CN1 (St 3B)    03/05/2016 -  Anti-estrogen oral therapy   Tamoxifen 20 mg daily stopped due to headache and uncontrolled hypertension. Decrease to 10 mg daily 04/03/2016, stopped June 2017 and took an estrogen metabolizer over-the-counter; started Aromasin April 2018 from a [place] physician, Zoladex started on 05/2017 and switched to Letrozole in 09/2017   01/04/2020 Surgery   Patient presented to the ED on 01/04/20 for worsening lower extremity numbness and underwent a decompressive laminectomy with tumor resection at T9 that was complicated with acute blood loss anemia and leukocytosis.   01/28/2020 - 02/15/2020 Radiation Therapy   Palliative radiation at site of thoracic tumor resection   03/03/2020 Miscellaneous    Ibrance with exemestane and Zoladex    Miscellaneous   Guardant 360: ESR 1 mutations, PI K3 CA mutation, EGFR mutation, T p53 mutation   10/11/2020 Miscellaneous   Alpelisib with letrozole and Zoladex     CHIEF COMPLIANT: Follow-up of metastatic breast cancer  INTERVAL HISTORY: Misty Arnold is a 50 y.o. with above-mentioned history of metastatic breast cancer who is currently on alpelisib with letrozole and Zoladex. She reports to the clinic today for follow-up.  She tells me that the pain in the breast has improved.    ALLERGIES:  is allergic to amoxicillin, penicillins, pork-derived products, penicillin g, and tamoxifen.  MEDICATIONS:  Current Outpatient Medications  Medication Sig Dispense Refill   acetaminophen (TYLENOL) 325 MG tablet Take 2 tablets (650  mg total) by mouth every 4 (four) hours as needed for mild pain ((score 1 to 3) or temp > 100.5). 30 tablet 0   amLODipine (NORVASC) 10 MG tablet Take 1 tablet (10 mg total) by mouth daily. 90 tablet 3   Ascorbic Acid (VITAMIN C PO) Take by mouth.      Bioflavonoid Products (ESTER C PO) Take 1 tablet by mouth with breakfast, with lunch, and with evening meal.      bisacodyl (DULCOLAX) 10 MG suppository Place 1 suppository (10 mg total) rectally daily as needed for moderate constipation. 12 suppository 0   BLACK CURRANT SEED OIL PO Take by mouth.     Calcium Carb-Cholecalciferol (OYSTER SHELL CALCIUM W/D) 500-200 MG-UNIT TABS TAKE 1 TABLET BY MOUTH TWICE A DAY WITH MEALS 180 tablet 1   Cholecalciferol ([employer] D3) 125 MCG (5000 UT) capsule Take 1 capsule (5,000 Units total) by mouth 2 (two) times daily. 180 capsule 3   diazepam (VALIUM) 5 MG tablet Take 1-2 tablets (5-10 mg total) by mouth every 6 (six) hours as needed for muscle spasms. 30 tablet 0   Flaxseed Oil OIL Take 5 mLs by mouth in the morning, at noon, and at bedtime.      goserelin (ZOLADEX) 3.6 MG injection Inject 3.6 mg into the skin every 28 (twenty-eight) days.     letrozole (FEMARA) 2.5 MG tablet TAKE 1 TABLET BY MOUTH EVERY DAY 90 tablet 1   lidocaine-prilocaine (EMLA) cream APPLY TOPICALLY AS NEEDED. 30 g 1   Oxycodone HCl 10 MG TABS Take 1 tablet (10 mg total) by mouth every 3 (three) hours as needed. 30 tablet 0   palbociclib (IBRANCE) 125 MG tablet TAKE 1 TABLET (125 MG TOTAL) BY MOUTH DAILY. TAKE FOR 21 DAYS ON, 7 DAYS OFF, REPEAT EVERY 28 DAYS. 21 tablet 2   polyethylene glycol (MIRALAX / GLYCOLAX) 17 g packet Take 17 g by mouth daily as needed for mild constipation. 14 each 0   No current facility-administered medications for this visit.    PHYSICAL EXAMINATION: ECOG PERFORMANCE STATUS: 1 - Symptomatic but completely ambulatory  Vitals:   05/26/21 1531  BP: 138/84  Pulse: (!) 107  Resp: 18  Temp: 97.7 F (36.5 C)  SpO2: 100%   Filed Weights   05/26/21 1531  Weight: 127 lb 9.6 oz (57.9 kg)    BREAST: No palpable masses or nodules in either right or left breasts. No palpable axillary supraclavicular or infraclavicular adenopathy no breast tenderness or nipple  discharge. (exam performed in the presence of a chaperone)  LABORATORY DATA:  I have reviewed the data as listed CMP Latest Ref Rng & Units 05/26/2021 05/13/2021 05/12/2021  Glucose 70 - 99 mg/dL 141(H) 102(H) 115(H)  BUN 6 - 20 mg/dL _0 Creatinine 0.44 - 1.00 mg/dL 1.11(H) 1.02(H) 0.91  Sodium 135 - 145 mmol/L 143 137 142  Potassium 3.5 - 5.1 mmol/L 4.1 4.1 3.9  Chloride 98 - 111 mmol/L 106 103 107  CO2 22 - 32 mmol/L _1 Calcium 8.9 - 10.3 mg/dL 9.4 9.4 9.2  Total Protein 6.5 - 8.1 g/dL 7.3 - 7.0  Total Bilirubin 0.3 - 1.2 mg/dL 0.3 - 0.4  Alkaline Phos 38 - 126 U/L 87 - 75  AST 15 - 41 U/L 29 - 23  ALT 0 - 44 U/L 14 - 13    Lab Results  Component Value Date   WBC 2.5 (L) 05/26/2021   HGB 12.0 05/26/2021  HCT 34.8 (L) 05/26/2021   MCV 98.0 05/26/2021   PLT 328 05/26/2021   NEUTROABS 1.6 (L) 05/26/2021    ASSESSMENT & PLAN:  Breast cancer of upper-outer quadrant of right female breast ([place]) Recurrent right breast cancer initially DCIS treated with mastectomy followed by reconstruction and later developed chest wall recurrence in 2013 ER/PR positive HER-2 negative, patient underwent alternative therapies in [place] but could not afford the trips and hence she has not been on any breast cancer therapy for a long time.   Treatment summary: 1. Right mastectomy 08/29/2015 at [facility] (Dr.Singh): IDC with invol of skin and skin ulceration, breast capsule inv cancer, superior medial margin positive, 1/1 LN positive, grade 3, 14.3 cm, 3.5 cm, ALI, chest wall involv, ER 90-100%, PR 80-90%, HER-2 negative, Ki 67 40-50% T4CN1 (St 3B) 2. Patient started tamoxifen but developed severe headache with severe hypertension and tamoxifen was discontinued 03/17/2016.  3. 02/2017 started on Exemestane, Zoladex started 05/29/2017 4.  Hospitalization for paraplegia: T9 vertebral body compression status post decompressive laminectomy and tumor resection 01/04/2020 5.  Palliative radiation to the  thoracic spine 01/28/2020 6.  Ibrance with letrozole started April 2021 ----------------------------------------------------------------------------------------------------------------- 03/17/2021: CT CAP: Redemonstrated bulky mass right breast with multiple metastatic lymph nodes and soft tissue lesions around the chest wall enlarged compared to before.  Multiple expansile mixed lytic and sclerotic bone lesions, worsening metastatic disease in the large lytic lesion posterior aspect of T8 and T9 vertebral bodies  05/11/2021: CT angiogram: Metastatic breast cancer soft tissue breast masses greater on the right, axillary supraclavicular lymphadenopathy, multiple bone metastases  Fatigue: Start B-12 injections monthly, Faslodex monthly Patient was interested to find out more about immunotherapy with Dostarlimab.  I discussed with her that immunotherapy would work only in patients with PD-L1 positive on her MSI high. We are performing guardant 360 today. However I would like to obtain a tissue so that we can send for Caris molecular test.  RTC monthly for injections      Orders Placed This Encounter  Procedures   SCHEDULING COMMUNICATION INJECTION    1 hour injection apppointment   Treatment conditions    Provider to add parameters.    Standing Status:   Standing    Number of Occurrences:   1    Order Specific Question:   Did you provide treatment parameters in the comments section?    Answer:   No   SCHEDULING COMMUNICATION INJECTION    Schedule 1 hour injection appointment   Treatment conditions    Provider to add parameters.    Standing Status:   Standing    Number of Occurrences:   1    Order Specific Question:   Did you provide treatment parameters in the comments section?    Answer:   No    The patient has a good understanding of the overall plan. she agrees with it. she will call with any problems that may develop before the next visit here.  Total time spent: 30 mins including  face to face time and time spent for planning, charting and coordination of care  Rulon Eisenmenger, MD, MPH 05/26/2021  I, Thana Ates, am acting as scribe for Dr. Nicholas Lose.  I have reviewed the above documentation for accuracy and completeness, and I agree with the above.

## 2021-05-26 ENCOUNTER — Inpatient Hospital Stay: Payer: Medicare PPO

## 2021-05-26 ENCOUNTER — Inpatient Hospital Stay (HOSPITAL_BASED_OUTPATIENT_CLINIC_OR_DEPARTMENT_OTHER): Payer: Medicare PPO | Admitting: Hematology and Oncology

## 2021-05-26 ENCOUNTER — Encounter: Payer: Self-pay | Admitting: Hematology and Oncology

## 2021-05-26 ENCOUNTER — Other Ambulatory Visit: Payer: Self-pay

## 2021-05-26 VITALS — BP 138/84 | HR 107 | Temp 97.7°F | Resp 18 | Ht 64.0 in | Wt 127.6 lb

## 2021-05-26 DIAGNOSIS — C50919 Malignant neoplasm of unspecified site of unspecified female breast: Secondary | ICD-10-CM

## 2021-05-26 DIAGNOSIS — Z79899 Other long term (current) drug therapy: Secondary | ICD-10-CM | POA: Diagnosis not present

## 2021-05-26 DIAGNOSIS — Z170 Estrogen receptor positive status [ER+]: Secondary | ICD-10-CM | POA: Diagnosis not present

## 2021-05-26 DIAGNOSIS — Z923 Personal history of irradiation: Secondary | ICD-10-CM | POA: Diagnosis not present

## 2021-05-26 DIAGNOSIS — C779 Secondary and unspecified malignant neoplasm of lymph node, unspecified: Secondary | ICD-10-CM | POA: Diagnosis not present

## 2021-05-26 DIAGNOSIS — Z9011 Acquired absence of right breast and nipple: Secondary | ICD-10-CM | POA: Diagnosis not present

## 2021-05-26 DIAGNOSIS — C7949 Secondary malignant neoplasm of other parts of nervous system: Secondary | ICD-10-CM

## 2021-05-26 DIAGNOSIS — C50411 Malignant neoplasm of upper-outer quadrant of right female breast: Secondary | ICD-10-CM | POA: Diagnosis not present

## 2021-05-26 DIAGNOSIS — C7951 Secondary malignant neoplasm of bone: Secondary | ICD-10-CM | POA: Diagnosis not present

## 2021-05-26 DIAGNOSIS — Z79811 Long term (current) use of aromatase inhibitors: Secondary | ICD-10-CM | POA: Diagnosis not present

## 2021-05-26 DIAGNOSIS — E538 Deficiency of other specified B group vitamins: Secondary | ICD-10-CM | POA: Diagnosis not present

## 2021-05-26 LAB — CBC WITH DIFFERENTIAL (CANCER CENTER ONLY)
Abs Immature Granulocytes: 0 10*3/uL (ref 0.00–0.07)
Basophils Absolute: 0.1 10*3/uL (ref 0.0–0.1)
Basophils Relative: 2 %
Eosinophils Absolute: 0.1 10*3/uL (ref 0.0–0.5)
Eosinophils Relative: 2 %
HCT: 34.8 % — ABNORMAL LOW (ref 36.0–46.0)
Hemoglobin: 12 g/dL (ref 12.0–15.0)
Immature Granulocytes: 0 %
Lymphocytes Relative: 29 %
Lymphs Abs: 0.7 10*3/uL (ref 0.7–4.0)
MCH: 33.8 pg (ref 26.0–34.0)
MCHC: 34.5 g/dL (ref 30.0–36.0)
MCV: 98 fL (ref 80.0–100.0)
Monocytes Absolute: 0.1 10*3/uL (ref 0.1–1.0)
Monocytes Relative: 5 %
Neutro Abs: 1.6 10*3/uL — ABNORMAL LOW (ref 1.7–7.7)
Neutrophils Relative %: 62 %
Platelet Count: 328 10*3/uL (ref 150–400)
RBC: 3.55 MIL/uL — ABNORMAL LOW (ref 3.87–5.11)
RDW: 14.8 % (ref 11.5–15.5)
WBC Count: 2.5 10*3/uL — ABNORMAL LOW (ref 4.0–10.5)
nRBC: 0 % (ref 0.0–0.2)
nRBC: 0 /100 WBC

## 2021-05-26 LAB — CMP (CANCER CENTER ONLY)
ALT: 14 U/L (ref 0–44)
AST: 29 U/L (ref 15–41)
Albumin: 3.8 g/dL (ref 3.5–5.0)
Alkaline Phosphatase: 87 U/L (ref 38–126)
Anion gap: 12 (ref 5–15)
BUN: 10 mg/dL (ref 6–20)
CO2: 25 mmol/L (ref 22–32)
Calcium: 9.4 mg/dL (ref 8.9–10.3)
Chloride: 106 mmol/L (ref 98–111)
Creatinine: 1.11 mg/dL — ABNORMAL HIGH (ref 0.44–1.00)
GFR, Estimated: >60 mL/min
Glucose, Bld: 141 mg/dL — ABNORMAL HIGH (ref 70–99)
Potassium: 4.1 mmol/L (ref 3.5–5.1)
Sodium: 143 mmol/L (ref 135–145)
Total Bilirubin: 0.3 mg/dL (ref 0.3–1.2)
Total Protein: 7.3 g/dL (ref 6.5–8.1)

## 2021-05-26 MED ORDER — CYANOCOBALAMIN 1000 MCG/ML IJ SOLN
1000.0000 ug | Freq: Once | INTRAMUSCULAR | Status: AC
  Administered 2021-05-26: 1000 ug via INTRAMUSCULAR

## 2021-05-26 MED ORDER — CYANOCOBALAMIN 1000 MCG/ML IJ SOLN
INTRAMUSCULAR | Status: AC
  Filled 2021-05-26: qty 1

## 2021-05-26 MED ORDER — GOSERELIN ACETATE 3.6 MG ~~LOC~~ IMPL
3.6000 mg | DRUG_IMPLANT | Freq: Once | SUBCUTANEOUS | Status: AC
  Administered 2021-05-26: 3.6 mg via SUBCUTANEOUS

## 2021-05-26 MED ORDER — GOSERELIN ACETATE 3.6 MG ~~LOC~~ IMPL
DRUG_IMPLANT | SUBCUTANEOUS | Status: AC
  Filled 2021-05-26: qty 3.6

## 2021-05-26 NOTE — Assessment & Plan Note (Signed)
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  Treatment summary: 1.Right mastectomy 08/29/2015 at [facility] (Dr.Singh): IDC with invol of skin and skin ulceration, breast capsule inv cancer, superior medial margin positive, 1/1 LN positive, grade 3, 14.3 cm, 3.5 cm, ALI, chest wall involv, ER 90-100%, PR 80-90%, HER-2 negative, Ki 67 40-50% T4CN1 (St 3B) 2.Patient started tamoxifen but developed severe headache with severe hypertension and tamoxifen was discontinued 03/17/2016. 3.02/2017 started on Exemestane, Zoladex started 05/29/2017 4.Hospitalization for paraplegia: T9 vertebral body compression status post decompressive laminectomy and tumor resection 01/04/2020 5.Palliative radiation to the thoracic spine 01/28/2020 6.Ibrance with letrozole started April 2021 ----------------------------------------------------------------------------------------------------------------- 03/17/2021: CT CAP: Redemonstrated bulky mass right breast with multiple metastatic lymph nodes and soft tissue lesions around the chest wall enlarged compared to before.  Multiple expansile mixed lytic and sclerotic bone lesions, worsening metastatic disease in the large lytic lesion posterior aspect of T8 and T9 vertebral bodies  05/11/2021: CT angiogram: Metastatic breast cancer soft tissue breast masses greater on the right, axillary supraclavicular lymphadenopathy, multiple bone metastases  Patient is reluctant to switch to alpelisib.  However she wants to continue with Ibrance for now.  Fatigue: start B 12 injections monthly RTC monthly for injections and q 3 month for MD visits

## 2021-05-29 ENCOUNTER — Other Ambulatory Visit: Payer: Self-pay | Admitting: *Deleted

## 2021-05-29 ENCOUNTER — Other Ambulatory Visit (HOSPITAL_COMMUNITY): Payer: Self-pay

## 2021-05-29 DIAGNOSIS — Z170 Estrogen receptor positive status [ER+]: Secondary | ICD-10-CM

## 2021-05-29 DIAGNOSIS — C50411 Malignant neoplasm of upper-outer quadrant of right female breast: Secondary | ICD-10-CM

## 2021-05-30 ENCOUNTER — Other Ambulatory Visit (HOSPITAL_COMMUNITY): Payer: Self-pay

## 2021-05-30 ENCOUNTER — Other Ambulatory Visit: Payer: Self-pay | Admitting: *Deleted

## 2021-05-30 DIAGNOSIS — Z170 Estrogen receptor positive status [ER+]: Secondary | ICD-10-CM

## 2021-05-30 DIAGNOSIS — C50411 Malignant neoplasm of upper-outer quadrant of right female breast: Secondary | ICD-10-CM

## 2021-05-31 ENCOUNTER — Other Ambulatory Visit (HOSPITAL_COMMUNITY): Payer: Self-pay

## 2021-06-01 ENCOUNTER — Other Ambulatory Visit: Payer: Self-pay

## 2021-06-01 DIAGNOSIS — C50411 Malignant neoplasm of upper-outer quadrant of right female breast: Secondary | ICD-10-CM

## 2021-06-01 DIAGNOSIS — Z170 Estrogen receptor positive status [ER+]: Secondary | ICD-10-CM

## 2021-06-01 NOTE — Progress Notes (Signed)
Pt called and asks for wound care referral as she was changing her dressing and her tumor "began to bleed for 4 hours." Pt asks to have wound help her control the bleeding and help with dressings. Referral entered. Pt understands she should be receiving a call.

## 2021-06-03 DIAGNOSIS — Z170 Estrogen receptor positive status [ER+]: Secondary | ICD-10-CM | POA: Diagnosis not present

## 2021-06-03 DIAGNOSIS — C50411 Malignant neoplasm of upper-outer quadrant of right female breast: Secondary | ICD-10-CM | POA: Diagnosis not present

## 2021-06-03 DIAGNOSIS — C50919 Malignant neoplasm of unspecified site of unspecified female breast: Secondary | ICD-10-CM | POA: Diagnosis not present

## 2021-06-03 DIAGNOSIS — C7951 Secondary malignant neoplasm of bone: Secondary | ICD-10-CM | POA: Diagnosis not present

## 2021-06-06 DIAGNOSIS — Z170 Estrogen receptor positive status [ER+]: Secondary | ICD-10-CM | POA: Diagnosis not present

## 2021-06-06 DIAGNOSIS — C50411 Malignant neoplasm of upper-outer quadrant of right female breast: Secondary | ICD-10-CM | POA: Diagnosis not present

## 2021-06-06 DIAGNOSIS — C50919 Malignant neoplasm of unspecified site of unspecified female breast: Secondary | ICD-10-CM | POA: Diagnosis not present

## 2021-06-06 DIAGNOSIS — C7951 Secondary malignant neoplasm of bone: Secondary | ICD-10-CM | POA: Diagnosis not present

## 2021-06-07 ENCOUNTER — Other Ambulatory Visit: Payer: Self-pay | Admitting: Hematology and Oncology

## 2021-06-07 ENCOUNTER — Telehealth: Payer: Self-pay

## 2021-06-07 DIAGNOSIS — C50411 Malignant neoplasm of upper-outer quadrant of right female breast: Secondary | ICD-10-CM

## 2021-06-07 DIAGNOSIS — Z170 Estrogen receptor positive status [ER+]: Secondary | ICD-10-CM

## 2021-06-07 NOTE — Telephone Encounter (Signed)
RN was notified that Misty Arnold was having difficulties contacting patient to set up biopsy.   RN was able to contact patient, provided number and explained importance of setting up appointments.  Patient verbalized understanding and agreed to place call to schedule appointments.

## 2021-06-14 DIAGNOSIS — Z4800 Encounter for change or removal of nonsurgical wound dressing: Secondary | ICD-10-CM | POA: Diagnosis not present

## 2021-06-14 DIAGNOSIS — Z170 Estrogen receptor positive status [ER+]: Secondary | ICD-10-CM | POA: Diagnosis not present

## 2021-06-14 DIAGNOSIS — Z9011 Acquired absence of right breast and nipple: Secondary | ICD-10-CM | POA: Diagnosis not present

## 2021-06-14 DIAGNOSIS — C7951 Secondary malignant neoplasm of bone: Secondary | ICD-10-CM | POA: Diagnosis not present

## 2021-06-14 DIAGNOSIS — C50411 Malignant neoplasm of upper-outer quadrant of right female breast: Secondary | ICD-10-CM | POA: Diagnosis not present

## 2021-06-14 DIAGNOSIS — I10 Essential (primary) hypertension: Secondary | ICD-10-CM | POA: Diagnosis not present

## 2021-06-14 LAB — GUARDANT 360

## 2021-06-15 ENCOUNTER — Other Ambulatory Visit: Payer: Medicare PPO

## 2021-06-15 ENCOUNTER — Other Ambulatory Visit: Payer: Self-pay

## 2021-06-15 ENCOUNTER — Ambulatory Visit
Admission: RE | Admit: 2021-06-15 | Discharge: 2021-06-15 | Disposition: A | Discharge status: Home / Self Care | Payer: Medicare PPO | Source: Ambulatory Visit | Attending: Adult Health | Admitting: Adult Health

## 2021-06-15 ENCOUNTER — Encounter: Payer: Self-pay | Admitting: Hematology and Oncology

## 2021-06-15 DIAGNOSIS — C773 Secondary and unspecified malignant neoplasm of axilla and upper limb lymph nodes: Secondary | ICD-10-CM | POA: Diagnosis not present

## 2021-06-15 DIAGNOSIS — C7989 Secondary malignant neoplasm of other specified sites: Secondary | ICD-10-CM | POA: Diagnosis not present

## 2021-06-15 DIAGNOSIS — C50919 Malignant neoplasm of unspecified site of unspecified female breast: Secondary | ICD-10-CM | POA: Diagnosis not present

## 2021-06-15 DIAGNOSIS — Z170 Estrogen receptor positive status [ER+]: Secondary | ICD-10-CM

## 2021-06-15 DIAGNOSIS — C50911 Malignant neoplasm of unspecified site of right female breast: Secondary | ICD-10-CM | POA: Diagnosis not present

## 2021-06-15 DIAGNOSIS — C50411 Malignant neoplasm of upper-outer quadrant of right female breast: Secondary | ICD-10-CM

## 2021-06-16 NOTE — Progress Notes (Signed)
Caris request successfully faxed on accession number [id number].

## 2021-06-19 DIAGNOSIS — C50411 Malignant neoplasm of upper-outer quadrant of right female breast: Secondary | ICD-10-CM | POA: Diagnosis not present

## 2021-06-19 DIAGNOSIS — Z170 Estrogen receptor positive status [ER+]: Secondary | ICD-10-CM | POA: Diagnosis not present

## 2021-06-19 DIAGNOSIS — I10 Essential (primary) hypertension: Secondary | ICD-10-CM | POA: Diagnosis not present

## 2021-06-19 DIAGNOSIS — C7951 Secondary malignant neoplasm of bone: Secondary | ICD-10-CM | POA: Diagnosis not present

## 2021-06-19 DIAGNOSIS — Z4800 Encounter for change or removal of nonsurgical wound dressing: Secondary | ICD-10-CM | POA: Diagnosis not present

## 2021-06-19 DIAGNOSIS — Z9011 Acquired absence of right breast and nipple: Secondary | ICD-10-CM | POA: Diagnosis not present

## 2021-06-20 DIAGNOSIS — Z170 Estrogen receptor positive status [ER+]: Secondary | ICD-10-CM | POA: Diagnosis not present

## 2021-06-20 DIAGNOSIS — C7951 Secondary malignant neoplasm of bone: Secondary | ICD-10-CM | POA: Diagnosis not present

## 2021-06-20 DIAGNOSIS — C50411 Malignant neoplasm of upper-outer quadrant of right female breast: Secondary | ICD-10-CM | POA: Diagnosis not present

## 2021-06-20 DIAGNOSIS — Z9011 Acquired absence of right breast and nipple: Secondary | ICD-10-CM | POA: Diagnosis not present

## 2021-06-20 DIAGNOSIS — I10 Essential (primary) hypertension: Secondary | ICD-10-CM | POA: Diagnosis not present

## 2021-06-20 DIAGNOSIS — Z4800 Encounter for change or removal of nonsurgical wound dressing: Secondary | ICD-10-CM | POA: Diagnosis not present

## 2021-06-21 ENCOUNTER — Other Ambulatory Visit (HOSPITAL_COMMUNITY): Payer: Self-pay

## 2021-06-21 ENCOUNTER — Other Ambulatory Visit: Payer: Self-pay | Admitting: Hematology and Oncology

## 2021-06-21 DIAGNOSIS — Z170 Estrogen receptor positive status [ER+]: Secondary | ICD-10-CM | POA: Diagnosis not present

## 2021-06-21 DIAGNOSIS — C50919 Malignant neoplasm of unspecified site of unspecified female breast: Secondary | ICD-10-CM | POA: Diagnosis not present

## 2021-06-21 DIAGNOSIS — C7951 Secondary malignant neoplasm of bone: Secondary | ICD-10-CM | POA: Diagnosis not present

## 2021-06-21 DIAGNOSIS — C50411 Malignant neoplasm of upper-outer quadrant of right female breast: Secondary | ICD-10-CM | POA: Diagnosis not present

## 2021-06-21 NOTE — Progress Notes (Signed)
Patient Care Team: Misty Lose, MD as PCP - General (Hematology and Oncology)  DIAGNOSIS:    ICD-10-CM   1. Malignant neoplasm of upper-outer quadrant of right breast in female, estrogen receptor positive ([place])  C50.411    Z17.0       SUMMARY OF ONCOLOGIC HISTORY: Oncology History  Breast cancer of upper-outer quadrant of right female breast (Misty Arnold)  09/21/2010 Mammogram   right breast linear and segmental pleomorphic calcifications from 4:00 to 6:00 position ultrasound revealed 1.6 cm and 1.1 cm masses   09/27/2010 Initial Biopsy   ultrasound-guided biopsy of all masses showed DCIS grade 2; patient went to cancer treatment centers of [place] for second opinion and delayed therapy   01/26/2011 Surgery   right mastectomy followed by reconstruction: 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 But she could not afford these trips.   01/08/2014 Breast MRI   right breast multiple enhancing masses within the soft tissues largest 5.6 cm in wall skin surface and the capsule of the silicone prosthesis, contiguous nodules involving in inferomedial breast and tired and subcentimeter nodules across the midline   08/29/2015 Surgery   Right mastectomy: IDC with invol of skin and skin ulceration, breast capsule inv cancer, superior medial margin positive, 1/1 LN positive, grade 3, 14.3 cm, 3.5 cm, ALI, chest wall involv, ER 90-100%, PR 80-90%, HER-2 negative, Ki 67 40-50% T4CN1 (St 3B)   03/05/2016 -  Anti-estrogen oral therapy   Tamoxifen 20 mg daily stopped due to headache  and uncontrolled hypertension. Decrease to 10 mg daily 04/03/2016, stopped June 2017 and took an estrogen metabolizer over-the-counter; started Aromasin April 2018 from a [place] physician, Zoladex started on 05/2017 and switched to Letrozole in 09/2017   01/04/2020 Surgery   Patient presented to the ED on 01/04/20 for worsening lower extremity numbness and underwent a decompressive laminectomy with tumor resection at T9 that was complicated with acute blood loss anemia and leukocytosis.   01/28/2020 - 02/15/2020 Radiation Therapy   Palliative radiation at site of thoracic tumor resection   03/03/2020 Miscellaneous    Ibrance with exemestane and Zoladex    Miscellaneous   Guardant 360: ESR 1 mutations, PI K3 CA mutation, EGFR mutation, T p53 mutation   10/11/2020 Miscellaneous   Alpelisib with letrozole and Zoladex     CHIEF COMPLIANT: Follow-up of metastatic breast cancer  INTERVAL HISTORY: Misty Arnold is a 50 y.o. with above-mentioned history of metastatic breast cancer who is currently on alpelisib with letrozole and Zoladex. She reports to the clinic today for follow-up.   ALLERGIES:  is allergic to amoxicillin, penicillins, pork-derived products, penicillin g, and tamoxifen.  MEDICATIONS:  Current Outpatient Medications  Medication Sig Dispense Refill   acetaminophen (TYLENOL) 325 MG tablet Take 2 tablets (650 mg total) by mouth every 4 (four) hours as needed for mild pain ((score 1 to 3) or temp > 100.5). 30 tablet 0   amLODipine (NORVASC) 10 MG tablet Take 1 tablet (10 mg total) by mouth daily. 90 tablet 3   Ascorbic Acid (VITAMIN C PO) Take by mouth.     Bioflavonoid Products (ESTER  C PO) Take 1 tablet by mouth with breakfast, with lunch, and with evening meal.      bisacodyl (DULCOLAX) 10 MG suppository Place 1 suppository (10 mg total) rectally daily as needed for moderate constipation. 12 suppository 0   BLACK CURRANT SEED OIL PO Take by mouth.     Calcium Carb-Cholecalciferol  (OYSTER SHELL CALCIUM W/D) 500-200 MG-UNIT TABS TAKE 1 TABLET BY MOUTH TWICE A DAY WITH MEALS 180 tablet 1   Cholecalciferol (CVS D3) 125 MCG (5000 UT) capsule Take 1 capsule (5,000 Units total) by mouth 2 (two) times daily. 180 capsule 3   diazepam (VALIUM) 5 MG tablet Take 1-2 tablets (5-10 mg total) by mouth every 6 (six) hours as needed for muscle spasms. 30 tablet 0   Flaxseed Oil OIL Take 5 mLs by mouth in the morning, at noon, and at bedtime.      goserelin (ZOLADEX) 3.6 MG injection Inject 3.6 mg into the skin every 28 (twenty-eight) days.     letrozole (FEMARA) 2.5 MG tablet TAKE 1 TABLET BY MOUTH EVERY DAY 90 tablet 1   lidocaine-prilocaine (EMLA) cream APPLY TOPICALLY AS NEEDED. 30 g 1   Oxycodone HCl 10 MG TABS Take 1 tablet (10 mg total) by mouth every 3 (three) hours as needed. 30 tablet 0   palbociclib (IBRANCE) 125 MG tablet TAKE 1 TABLET (125 MG TOTAL) BY MOUTH DAILY. TAKE FOR 21 DAYS ON, 7 DAYS OFF, REPEAT EVERY 28 DAYS. 21 tablet 2   polyethylene glycol (MIRALAX / GLYCOLAX) 17 g packet Take 17 g by mouth daily as needed for mild constipation. 14 each 0   No current facility-administered medications for this visit.    PHYSICAL EXAMINATION: ECOG PERFORMANCE STATUS: 1 - Symptomatic but completely ambulatory  There were no vitals filed for this visit. There were no vitals filed for this visit.   LABORATORY DATA:  I have reviewed the data as listed CMP Latest Ref Rng & Units 05/26/2021 05/13/2021 05/12/2021  Glucose 70 - 99 mg/dL 141(H) 102(H) 115(H)  BUN 6 - 20 mg/dL _0 Creatinine 0.44 - 1.00 mg/dL 1.11(H) 1.02(H) 0.91  Sodium 135 - 145 mmol/L 143 137 142  Potassium 3.5 - 5.1 mmol/L 4.1 4.1 3.9  Chloride 98 - 111 mmol/L 106 103 107  CO2 22 - 32 mmol/L _1 Calcium 8.9 - 10.3 mg/dL 9.4 9.4 9.2  Total Protein 6.5 - 8.1 g/dL 7.3 - 7.0  Total Bilirubin 0.3 - 1.2 mg/dL 0.3 - 0.4  Alkaline Phos 38 - 126 U/L 87 - 75  AST 15 - 41 U/L 29 - 23  ALT 0 - 44 U/L 14 - 13     Lab Results  Component Value Date   WBC 2.5 (L) 05/26/2021   HGB 12.0 05/26/2021   HCT 34.8 (L) 05/26/2021   MCV 98.0 05/26/2021   PLT 328 05/26/2021   NEUTROABS 1.6 (L) 05/26/2021    ASSESSMENT & PLAN:  Breast cancer of upper-outer quadrant of right female breast ([place]) Recurrent right breast cancer initially DCIS treated with mastectomy followed by reconstruction and later developed chest wall recurrence in 2013 ER/PR positive HER-2 negative, patient underwent alternative therapies in [place] but could not afford the trips and hence she has not been on any breast cancer therapy for a long time.   Treatment summary: 1. Right mastectomy 08/29/2015 at [facility] (Dr.Singh): IDC with invol of skin and skin ulceration, breast capsule inv cancer, superior medial margin positive, 1/1 LN positive,  grade 3, 14.3 cm, 3.5 cm, ALI, chest wall involv, ER 90-100%, PR 80-90%, HER-2 negative, Ki 67 40-50% T4CN1 (St 3B) 2. Patient started tamoxifen but developed severe headache with severe hypertension and tamoxifen was discontinued 03/17/2016.  3. 02/2017 started on Exemestane, Zoladex started 05/29/2017 4.  Hospitalization for paraplegia: T9 vertebral body compression status post decompressive laminectomy and tumor resection 01/04/2020 5.  Palliative radiation to the thoracic spine 01/28/2020 6.  Ibrance with letrozole started April 2021 ----------------------------------------------------------------------------------------------------------------- 03/17/2021: CT CAP: Redemonstrated bulky mass right breast with multiple metastatic lymph nodes and soft tissue lesions around the chest wall enlarged compared to before.  Multiple expansile mixed lytic and sclerotic bone lesions, worsening metastatic disease in the large lytic lesion posterior aspect of T8 and T9 vertebral bodies   05/11/2021: CT angiogram: Metastatic breast cancer soft tissue breast masses greater on the right, axillary supraclavicular  lymphadenopathy, multiple bone metastases   Fatigue: Start B-12 injections monthly, Faslodex monthly  Right axillary lymph node biopsy 06/15/2021: Metastatic mammary carcinoma, sent for Caris molecular testing.  Awaiting the results of these tests to determine if a change in treatment is warranted. Clinically patient thinks that the tumor is shrinking.  Guardant 360 05/30/2021: No reportable alterations  Continue with monthly B12 and do Zoladex injections.  No orders of the defined types were placed in this encounter.  The patient has a good understanding of the overall plan. she agrees with it. she will call with any problems that may develop before the next visit here.  Total time spent: 30 mins including face to face time and time spent for planning, charting and coordination of care  Vinay K Gudena, MD, MPH 06/22/2021  I, Kirstyn Evans, am acting as scribe for Dr. Vinay Gudena.  I have reviewed the above documentation for accuracy and completeness, and I agree with the above.       

## 2021-06-22 ENCOUNTER — Inpatient Hospital Stay: Payer: Medicare PPO

## 2021-06-22 ENCOUNTER — Encounter: Payer: Self-pay | Admitting: Hematology and Oncology

## 2021-06-22 ENCOUNTER — Other Ambulatory Visit (HOSPITAL_COMMUNITY): Payer: Self-pay

## 2021-06-22 ENCOUNTER — Other Ambulatory Visit: Payer: Self-pay

## 2021-06-22 ENCOUNTER — Other Ambulatory Visit: Payer: Self-pay | Admitting: Hematology and Oncology

## 2021-06-22 ENCOUNTER — Inpatient Hospital Stay: Payer: Medicare PPO | Attending: Hematology and Oncology

## 2021-06-22 ENCOUNTER — Inpatient Hospital Stay (HOSPITAL_BASED_OUTPATIENT_CLINIC_OR_DEPARTMENT_OTHER): Payer: Medicare PPO | Admitting: Hematology and Oncology

## 2021-06-22 DIAGNOSIS — Z170 Estrogen receptor positive status [ER+]: Secondary | ICD-10-CM

## 2021-06-22 DIAGNOSIS — C50411 Malignant neoplasm of upper-outer quadrant of right female breast: Secondary | ICD-10-CM | POA: Diagnosis not present

## 2021-06-22 DIAGNOSIS — Z9011 Acquired absence of right breast and nipple: Secondary | ICD-10-CM | POA: Insufficient documentation

## 2021-06-22 DIAGNOSIS — G8220 Paraplegia, unspecified: Secondary | ICD-10-CM | POA: Insufficient documentation

## 2021-06-22 DIAGNOSIS — C7951 Secondary malignant neoplasm of bone: Secondary | ICD-10-CM | POA: Insufficient documentation

## 2021-06-22 DIAGNOSIS — C779 Secondary and unspecified malignant neoplasm of lymph node, unspecified: Secondary | ICD-10-CM | POA: Insufficient documentation

## 2021-06-22 DIAGNOSIS — Z79899 Other long term (current) drug therapy: Secondary | ICD-10-CM | POA: Insufficient documentation

## 2021-06-22 DIAGNOSIS — C7949 Secondary malignant neoplasm of other parts of nervous system: Secondary | ICD-10-CM

## 2021-06-22 LAB — CMP (CANCER CENTER ONLY)
ALT: 16 U/L (ref 0–44)
AST: 28 U/L (ref 15–41)
Albumin: 3.9 g/dL (ref 3.5–5.0)
Alkaline Phosphatase: 99 U/L (ref 38–126)
Anion gap: 11 (ref 5–15)
BUN: 15 mg/dL (ref 6–20)
CO2: 27 mmol/L (ref 22–32)
Calcium: 9.6 mg/dL (ref 8.9–10.3)
Chloride: 103 mmol/L (ref 98–111)
Creatinine: 1.19 mg/dL — ABNORMAL HIGH (ref 0.44–1.00)
GFR, Estimated: 55 mL/min — ABNORMAL LOW (ref >60)
Glucose, Bld: 117 mg/dL — ABNORMAL HIGH (ref 70–99)
Potassium: 3.9 mmol/L (ref 3.5–5.1)
Sodium: 141 mmol/L (ref 135–145)
Total Bilirubin: 0.4 mg/dL (ref 0.3–1.2)
Total Protein: 7.4 g/dL (ref 6.5–8.1)

## 2021-06-22 LAB — CBC WITH DIFFERENTIAL (CANCER CENTER ONLY)
Abs Immature Granulocytes: 0.02 10*3/uL (ref 0.00–0.07)
Basophils Absolute: 0.1 10*3/uL (ref 0.0–0.1)
Basophils Relative: 2 %
Eosinophils Absolute: 0 10*3/uL (ref 0.0–0.5)
Eosinophils Relative: 1 %
HCT: 34.1 % — ABNORMAL LOW (ref 36.0–46.0)
Hemoglobin: 11.7 g/dL — ABNORMAL LOW (ref 12.0–15.0)
Immature Granulocytes: 1 %
Lymphocytes Relative: 27 %
Lymphs Abs: 0.8 10*3/uL (ref 0.7–4.0)
MCH: 33.9 pg (ref 26.0–34.0)
MCHC: 34.3 g/dL (ref 30.0–36.0)
MCV: 98.8 fL (ref 80.0–100.0)
Monocytes Absolute: 0.3 10*3/uL (ref 0.1–1.0)
Monocytes Relative: 9 %
Neutro Abs: 1.9 10*3/uL (ref 1.7–7.7)
Neutrophils Relative %: 60 %
Platelet Count: 358 10*3/uL (ref 150–400)
RBC: 3.45 MIL/uL — ABNORMAL LOW (ref 3.87–5.11)
RDW: 14.9 % (ref 11.5–15.5)
WBC Count: 3.1 10*3/uL — ABNORMAL LOW (ref 4.0–10.5)
nRBC: 0 % (ref 0.0–0.2)

## 2021-06-22 MED ORDER — GOSERELIN ACETATE 3.6 MG ~~LOC~~ IMPL
DRUG_IMPLANT | SUBCUTANEOUS | Status: AC
  Filled 2021-06-22: qty 3.6

## 2021-06-22 MED ORDER — CYANOCOBALAMIN 1000 MCG/ML IJ SOLN
1000.0000 ug | Freq: Once | INTRAMUSCULAR | Status: AC
  Administered 2021-06-22: 1000 ug via INTRAMUSCULAR

## 2021-06-22 MED ORDER — CYANOCOBALAMIN 1000 MCG/ML IJ SOLN
INTRAMUSCULAR | Status: AC
  Filled 2021-06-22: qty 1

## 2021-06-22 MED ORDER — GOSERELIN ACETATE 3.6 MG ~~LOC~~ IMPL
3.6000 mg | DRUG_IMPLANT | Freq: Once | SUBCUTANEOUS | Status: AC
  Administered 2021-06-22: 3.6 mg via SUBCUTANEOUS

## 2021-06-22 MED ORDER — PALBOCICLIB 125 MG PO TABS
ORAL_TABLET | ORAL | 2 refills | Status: DC
  Filled 2021-06-22: qty 21, 28d supply, fill #0
  Filled 2021-07-19: qty 21, 28d supply, fill #1
  Filled 2021-08-15: qty 21, 28d supply, fill #2

## 2021-06-22 NOTE — Patient Instructions (Signed)
Goserelin injection What is this medication? GOSERELIN (GOE se rel in) is similar to a hormone found in the body. It lowers the amount of sex hormones that the body makes. Men will have lower testosterone levels and women will have lower estrogen levels while taking this medicine. In men, this medicine is used to treat prostate cancer; the injection is either given once per month or once every 12 weeks. A once per month injection (only) is used to treat women with endometriosis, dysfunctionaluterine bleeding, or advanced breast cancer. This medicine may be used for other purposes; ask your health care provider orpharmacist if you have questions. COMMON BRAND NAME(S): Zoladex What should I tell my care team before I take this medication? They need to know if you have any of these conditions: bone problems diabetes heart disease history of irregular heartbeat an unusual or allergic reaction to goserelin, other medicines, foods, dyes, or preservatives pregnant or trying to get pregnant breast-feeding How should I use this medication? This medicine is for injection under the skin. It is given by a health careprofessional in a hospital or clinic setting. Talk to your pediatrician regarding the use of this medicine in children.Special care may be needed. Overdosage: If you think you have taken too much of this medicine contact apoison control center or emergency room at once. NOTE: This medicine is only for you. Do not share this medicine with others. What if I miss a dose? It is important not to miss your dose. Call your doctor or health careprofessional if you are unable to keep an appointment. What may interact with this medication? Do not take this medicine with any of the following medications: cisapride dronedarone pimozide thioridazine This medicine may also interact with the following medications: other medicines that prolong the QT interval (an abnormal heart rhythm) This list may not  describe all possible interactions. Give your health care provider a list of all the medicines, herbs, non-prescription drugs, or dietary supplements you use. Also tell them if you smoke, drink alcohol, or use illegaldrugs. Some items may interact with your medicine. What should I watch for while using this medication? Visit your doctor or health care provider for regular checks on your progress. Your symptoms may appear to get worse during the first weeks of this therapy. Tell your doctor or healthcare provider if your symptoms do not start to getbetter or if they get worse after this time. Your bones may get weaker if you take this medicine for a long time. If you smoke or frequently drink alcohol you may increase your risk of bone loss. A family history of osteoporosis, chronic use of drugs for seizures (convulsions), or corticosteroids can also increase your risk of bone loss.Talk to your doctor about how to keep your bones strong. This medicine should stop regular monthly menstruation in women. Tell yourdoctor if you continue to menstruate. Women should not become pregnant while taking this medicine or for 12 weeks after stopping this medicine. Women should inform their doctor if they wish to become pregnant or think they might be pregnant. There is a potential for serious side effects to an unborn child. Talk to your health care professional or pharmacist for more information. Do not breast-feed an infant while takingthis medicine. Men should inform their doctors if they wish to father a child. This medicine may lower sperm counts. Talk to your health care professional or pharmacist formore information. This medicine may increase blood sugar. Ask your healthcare provider if changesin diet or medicines  are needed if you have diabetes. What side effects may I notice from receiving this medication? Side effects that you should report to your doctor or health care professionalas soon as  possible: allergic reactions like skin rash, itching or hives, swelling of the face, lips, or tongue bone pain breathing problems changes in vision chest pain feeling faint or lightheaded, falls fever, chills pain, swelling, warmth in the leg pain, tingling, numbness in the hands or feet signs and symptoms of high blood sugar such as being more thirsty or hungry or having to urinate more than normal. You may also feel very tired or have blurry vision signs and symptoms of low blood pressure like dizziness; feeling faint or lightheaded, falls; unusually weak or tired stomach pain swelling of the ankles, feet, hands trouble passing urine or change in the amount of urine unusually high or low blood pressure unusually weak or tired Side effects that usually do not require medical attention (report to yourdoctor or health care professional if they continue or are bothersome): change in sex drive or performance changes in breast size in both males and females changes in emotions or moods headache hot flashes irritation at site where injected loss of appetite skin problems like acne, dry skin vaginal dryness This list may not describe all possible side effects. Call your doctor for medical advice about side effects. You may report side effects to FDA at1-800-FDA-1088. Where should I keep my medication? This drug is given in a hospital or clinic and will not be stored at home. NOTE: This sheet is a summary. It may not cover all possible information. If you have questions about this medicine, talk to your doctor, pharmacist, orhealth care provider. Vitamin B12 Deficiency Vitamin B12 deficiency occurs when the body does not have enough vitamin B12, which is an important vitamin. The body needs this vitamin: To make red blood cells. To make DNA. This is the genetic material inside cells. To help the nerves work properly so they can carry messages from the brain to the body. Vitamin B12  deficiency can cause various health problems, such as a low red blood cell count (anemia) or nerve damage. What are the causes? This condition may be caused by: Not eating enough foods that contain vitamin B12. Not having enough stomach acid and digestive fluids to properly absorb vitamin B12 from the food that you eat. Certain digestive system diseases that make it hard to absorb vitamin B12. These diseases include Crohn's disease, chronic pancreatitis, and cystic fibrosis. A condition in which the body does not make enough of a protein (intrinsic factor), resulting in too few red blood cells (pernicious anemia). Having a surgery in which part of the stomach or small intestine is removed. Taking certain medicines that make it hard for the body to absorb vitamin B12. These medicines include: Heartburn medicines (antacids and proton pump inhibitors). Certain antibiotic medicines. Some medicines that are used to treat diabetes, tuberculosis, gout, or high cholesterol. What increases the risk? The following factors may make you more likely to develop a B12 deficiency: Being older than age 52. Eating a vegetarian or vegan diet, especially while you are pregnant. Eating a poor diet while you are pregnant. Taking certain medicines. Having alcoholism. What are the signs or symptoms? In some cases, there are no symptoms of this condition. If the condition leads to anemia or nerve damage, various symptoms can occur, such as: Weakness. Fatigue. Loss of appetite. Weight loss. Numbness or tingling in your hands and feet.  Redness and burning of the tongue. Confusion or memory problems. Depression. Sensory problems, such as color blindness, ringing in the ears, or loss of taste. Diarrhea or constipation. Trouble walking. If anemia is severe, symptoms can include: Shortness of breath. Dizziness. Rapid heart rate (tachycardia). How is this diagnosed? This condition may be diagnosed with a blood  test to measure the level of vitamin B12 in your blood. You may also have other tests, including: A group of tests that measure certain characteristics of blood cells (complete blood count, CBC). A blood test to measure intrinsic factor. A procedure where a thin tube with a camera on the end is used to look into your stomach or intestines (endoscopy). Other tests may be needed to discover the cause of B12 deficiency. How is this treated? Treatment for this condition depends on the cause. This condition may be treated by: Changing your eating and drinking habits, such as: Eating more foods that contain vitamin B12. Drinking less alcohol or no alcohol. Getting vitamin B12 injections. Taking vitamin B12 supplements. Your health care provider will tell you which dosage is best for you. Follow these instructions at home: Eating and drinking  Eat lots of healthy foods that contain vitamin B12, including: Meats and poultry. This includes beef, pork, chicken, [place], and organ meats, such as liver. Seafood. This includes clams, rainbow trout, salmon, tuna, and haddock. Eggs. Cereal and dairy products that are fortified. This means that vitamin B12 has been added to the food. Check the label on the package to see if the food is fortified. The items listed above may not be a complete list of recommended foods and beverages. Contact a dietitian for more information. General instructions Get any injections that are prescribed by your health care provider. Take supplements only as told by your health care provider. Follow the directions carefully. Do not drink alcohol if your health care provider tells you not to. In some cases, you may only be asked to limit alcohol use. Keep all follow-up visits as told by your health care provider. This is important. Contact a health care provider if: Your symptoms come back. Get help right away if you: Develop shortness of breath. Have a rapid heart rate. Have  chest pain. Become dizzy or lose consciousness. Summary Vitamin B12 deficiency occurs when the body does not have enough vitamin B12. The main causes of vitamin B12 deficiency include dietary deficiency, digestive diseases, pernicious anemia, and having a surgery in which part of the stomach or small intestine is removed. In some cases, there are no symptoms of this condition. If the condition leads to anemia or nerve damage, various symptoms can occur, such as weakness, shortness of breath, and numbness. Treatment may include getting vitamin B12 injections or taking vitamin B12 supplements. Eat lots of healthy foods that contain vitamin B12. This information is not intended to replace advice given to you by your health care provider. Make sure you discuss any questions you have with your healthcare provider. Document Revised: 04/10/2019 Document Reviewed: 07/01/2018 Elsevier Patient Education  2022 [place].   2022 Elsevier/Gold Standard (2019-02-09 14:05:56)

## 2021-06-22 NOTE — Assessment & Plan Note (Signed)
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  Treatment summary: 1.Right mastectomy 08/29/2015 at [facility] (Dr.Singh): IDC with invol of skin and skin ulceration, breast capsule inv cancer, superior medial margin positive, 1/1 LN positive, grade 3, 14.3 cm, 3.5 cm, ALI, chest wall involv, ER 90-100%, PR 80-90%, HER-2 negative, Ki 67 40-50% T4CN1 (St 3B) 2.Patient started tamoxifen but developed severe headache with severe hypertension and tamoxifen was discontinued 03/17/2016. 3.02/2017 started on Exemestane, Zoladex started 05/29/2017 4.Hospitalization for paraplegia: T9 vertebral body compression status post decompressive laminectomy and tumor resection 01/04/2020 5.Palliative radiation to the thoracic spine 01/28/2020 6.Ibrance with letrozole started April 2021 ----------------------------------------------------------------------------------------------------------------- 03/17/2021: CT CAP: Redemonstrated bulky mass right breast with multiple metastatic lymph nodes and soft tissue lesions around the chest wall enlarged compared to before. Multiple expansile mixed lytic and sclerotic bone lesions, worsening metastatic disease in the large lytic lesion posterior aspect of T8 and T9 vertebral bodies  05/11/2021: CT angiogram: Metastatic breast cancer soft tissue breast masses greater on the right, axillary supraclavicular lymphadenopathy, multiple bone metastases  Fatigue: Start B-12 injections monthly, Faslodex monthly  Right axillary lymph node biopsy 06/15/2021: Metastatic mammary carcinoma, sent for [facility] molecular testing.  Guardant 360 05/30/2021: No reportable alterations

## 2021-06-23 ENCOUNTER — Encounter (HOSPITAL_COMMUNITY): Payer: Self-pay

## 2021-06-27 ENCOUNTER — Other Ambulatory Visit (HOSPITAL_COMMUNITY): Payer: Self-pay

## 2021-06-28 DIAGNOSIS — C50411 Malignant neoplasm of upper-outer quadrant of right female breast: Secondary | ICD-10-CM | POA: Diagnosis not present

## 2021-06-28 DIAGNOSIS — C7951 Secondary malignant neoplasm of bone: Secondary | ICD-10-CM | POA: Diagnosis not present

## 2021-06-28 DIAGNOSIS — Z9011 Acquired absence of right breast and nipple: Secondary | ICD-10-CM | POA: Diagnosis not present

## 2021-06-28 DIAGNOSIS — I10 Essential (primary) hypertension: Secondary | ICD-10-CM | POA: Diagnosis not present

## 2021-06-28 DIAGNOSIS — Z170 Estrogen receptor positive status [ER+]: Secondary | ICD-10-CM | POA: Diagnosis not present

## 2021-06-28 DIAGNOSIS — Z4800 Encounter for change or removal of nonsurgical wound dressing: Secondary | ICD-10-CM | POA: Diagnosis not present

## 2021-07-03 ENCOUNTER — Other Ambulatory Visit: Payer: Self-pay

## 2021-07-03 ENCOUNTER — Encounter (HOSPITAL_BASED_OUTPATIENT_CLINIC_OR_DEPARTMENT_OTHER): Payer: Medicare PPO | Attending: Internal Medicine | Admitting: Internal Medicine

## 2021-07-03 DIAGNOSIS — C50919 Malignant neoplasm of unspecified site of unspecified female breast: Secondary | ICD-10-CM

## 2021-07-03 DIAGNOSIS — Z888 Allergy status to other drugs, medicaments and biological substances status: Secondary | ICD-10-CM | POA: Diagnosis not present

## 2021-07-03 DIAGNOSIS — Z880 Allergy status to penicillin: Secondary | ICD-10-CM | POA: Insufficient documentation

## 2021-07-03 DIAGNOSIS — Z8349 Family history of other endocrine, nutritional and metabolic diseases: Secondary | ICD-10-CM | POA: Diagnosis not present

## 2021-07-03 DIAGNOSIS — Z853 Personal history of malignant neoplasm of breast: Secondary | ICD-10-CM | POA: Insufficient documentation

## 2021-07-03 DIAGNOSIS — Z923 Personal history of irradiation: Secondary | ICD-10-CM | POA: Diagnosis not present

## 2021-07-03 DIAGNOSIS — L98499 Non-pressure chronic ulcer of skin of other sites with unspecified severity: Secondary | ICD-10-CM | POA: Insufficient documentation

## 2021-07-03 DIAGNOSIS — S21109A Unspecified open wound of unspecified front wall of thorax without penetration into thoracic cavity, initial encounter: Secondary | ICD-10-CM | POA: Diagnosis not present

## 2021-07-03 DIAGNOSIS — Z833 Family history of diabetes mellitus: Secondary | ICD-10-CM | POA: Insufficient documentation

## 2021-07-03 DIAGNOSIS — C7949 Secondary malignant neoplasm of other parts of nervous system: Secondary | ICD-10-CM | POA: Diagnosis not present

## 2021-07-03 DIAGNOSIS — Z9011 Acquired absence of right breast and nipple: Secondary | ICD-10-CM | POA: Insufficient documentation

## 2021-07-03 DIAGNOSIS — Z91014 Allergy to mammalian meats: Secondary | ICD-10-CM | POA: Insufficient documentation

## 2021-07-03 DIAGNOSIS — C50411 Malignant neoplasm of upper-outer quadrant of right female breast: Secondary | ICD-10-CM | POA: Diagnosis not present

## 2021-07-03 DIAGNOSIS — Z8249 Family history of ischemic heart disease and other diseases of the circulatory system: Secondary | ICD-10-CM | POA: Insufficient documentation

## 2021-07-03 DIAGNOSIS — C773 Secondary and unspecified malignant neoplasm of axilla and upper limb lymph nodes: Secondary | ICD-10-CM | POA: Diagnosis not present

## 2021-07-03 NOTE — Progress Notes (Signed)
Misty Arnold, Misty Arnold ([id number]) Visit Report for 07/03/2021 Abuse/Suicide Risk Screen Details Patient Name: Date of Service: Misty Arnold, Misty Arnold 07/03/2021 1:15 PM Medical Record Number: [id number] Patient Account Number: [id number] Date of Birth/Sex: Treating RN: November 28, 1970 (50 y.o. Elam Dutch Primary Care Emett Stapel: Nicholas Lose Other Clinician: Referring Dalton Mille: Treating Melady Chow/Extender: Vanessa Barbara Weeks in Treatment: 0 Abuse/Suicide Risk Screen Items Answer ABUSE RISK SCREEN: Has anyone close to you tried to hurt or harm you recentlyo No Do you feel uncomfortable with anyone in your familyo No Has anyone forced you do things that you didnt want to doo No Electronic Signature(s) Signed: 07/03/2021 5:42:12 PM By: Baruch Gouty RN, BSN Entered By: Baruch Gouty on 07/03/2021 13:58:04 -------------------------------------------------------------------------------- Activities of Daily Living Details Patient Name: Date of Service: Misty Arnold, Misty Arnold 07/03/2021 1:15 PM Medical Record Number: [id number] Patient Account Number: [id number] Date of Birth/Sex: Treating RN: 1971/02/21 (50 y.o. Elam Dutch Primary Care Joesphine Schemm: Nicholas Lose Other Clinician: Referring Adrieana Fennelly: Treating Ladora Osterberg/Extender: Vanessa Barbara Weeks in Treatment: 0 Activities of Daily Living Items Answer Activities of Daily Living (Please select one for each item) Drive Automobile Completely Able T Medications ake Completely Able Use T elephone Completely Able Care for Appearance Completely Able Use T oilet Completely Able Bath / Shower Completely Able Dress Self Completely Able Feed Self Completely Able Walk Completely Able Get In / Out Bed Completely Able Housework Completely Able Prepare Meals Completely Able Handle Money Completely Able Shop for Self Completely Able Electronic Signature(s) Signed: 07/03/2021 5:42:12 PM By: Baruch Gouty RN,  BSN Entered By: Baruch Gouty on 07/03/2021 13:58:26 -------------------------------------------------------------------------------- Education Screening Details Patient Name: Date of Service: Misty Arnold, Misty Arnold 07/03/2021 1:15 PM Medical Record Number: [id number] Patient Account Number: [id number] Date of Birth/Sex: Treating RN: 06/15/1971 (50 y.o. Elam Dutch Primary Care Perley Arthurs: Nicholas Lose Other Clinician: Referring Osbaldo Mark: Treating Jerae Izard/Extender: Vanessa Barbara Weeks in Treatment: 0 Primary Learner Assessed: Patient Learning Preferences/Education Level/Primary Language Learning Preference: Explanation, Demonstration, Printed Material Highest Education Level: High School Preferred Language: English Cognitive Barrier Language Barrier: No Translator Needed: No Memory Deficit: No Emotional Barrier: No Cultural/Religious Beliefs Affecting Medical Care: No Physical Barrier Impaired Vision: No Impaired Hearing: No Decreased Hand dexterity: No Knowledge/Comprehension Knowledge Level: High Comprehension Level: High Ability to understand written instructions: High Ability to understand verbal instructions: High Motivation Anxiety Level: Calm Cooperation: Cooperative Education Importance: Acknowledges Need Interest in Health Problems: Asks Questions Perception: Coherent Willingness to Engage in Self-Management High Activities: Readiness to Engage in Self-Management High Activities: Electronic Signature(s) Signed: 07/03/2021 5:42:12 PM By: Baruch Gouty RN, BSN Entered By: Baruch Gouty on 07/03/2021 13:58:59 -------------------------------------------------------------------------------- Fall Risk Assessment Details Patient Name: Date of Service: Misty Arnold, Misty Arnold 07/03/2021 1:15 PM Medical Record Number: [id number] Patient Account Number: [id number] Date of Birth/Sex: Treating RN: 09-Jun-1971 (50 y.o. Elam Dutch Primary Care  Velma Hanna: Nicholas Lose Other Clinician: Referring Jasmane Brockway: Treating Jazari Ober/Extender: Vanessa Barbara Weeks in Treatment: 0 Fall Risk Assessment Items Have you had 2 or more falls in the last 12 monthso 0 Yes Have you had any fall that resulted in injury in the last 12 monthso 0 No FALLS RISK SCREEN History of falling - immediate or within 3 months 0 No Secondary diagnosis (Do you have 2 or more medical diagnoseso) 0 No Ambulatory aid None/bed rest/wheelchair/nurse 0 No Crutches/cane/walker 15 Yes Furniture 0 No Intravenous therapy Access/Saline/Heparin Lock 0 No Gait/Transferring Normal/ bed rest/ wheelchair 0 No Weak (short steps with or without shuffle, stooped but  able to lift head while walking, may seek 10 Yes support from furniture) Impaired (short steps with shuffle, may have difficulty arising from chair, head down, impaired 0 No balance) Mental Status Oriented to own ability 0 Yes Electronic Signature(s) Signed: 07/03/2021 5:42:12 PM By: Baruch Gouty RN, BSN Entered By: Baruch Gouty on 07/03/2021 13:59:27 -------------------------------------------------------------------------------- Foot Assessment Details Patient Name: Date of Service: Misty Arnold, Misty Arnold 07/03/2021 1:15 PM Medical Record Number: [id number] Patient Account Number: [id number] Date of Birth/Sex: Treating RN: 11-May-1971 (50 y.o. Elam Dutch Primary Care Vidya Bamford: Nicholas Lose Other Clinician: Referring Fortino Haag: Treating Tija Biss/Extender: Vanessa Barbara Weeks in Treatment: 0 Foot Assessment Items Site Locations + = Sensation present, - = Sensation absent, C = Callus, U = Ulcer R = Redness, W = Warmth, M = Maceration, PU = Pre-ulcerative lesion F = Fissure, S = Swelling, D = Dryness Assessment Right: Left: Other Deformity: No No Prior Foot Ulcer: No No Prior Amputation: No No Charcot Joint: No No Ambulatory Status: Ambulatory With  Help Assistance Device: Cane Gait: Steady Electronic Signature(s) Signed: 07/03/2021 5:42:12 PM By: Baruch Gouty RN, BSN Entered By: Baruch Gouty on 07/03/2021 14:01:53 -------------------------------------------------------------------------------- Nutrition Risk Screening Details Patient Name: Date of Service: Misty Arnold, Misty Arnold 07/03/2021 1:15 PM Medical Record Number: [id number] Patient Account Number: [id number] Date of Birth/Sex: Treating RN: August 02, 1971 (50 y.o. Elam Dutch Primary Care Natane Heward: Nicholas Lose Other Clinician: Referring Dravon Nott: Treating Takao Lizer/Extender: Vanessa Barbara Weeks in Treatment: 0 Height (in): 65 Weight (lbs): 125 Body Mass Index (BMI): 20.8 Nutrition Risk Screening Items Score Screening NUTRITION RISK SCREEN: I have an illness or condition that made me change the kind and/or amount of food I eat 0 No I eat fewer than two meals per day 0 No I eat few fruits and vegetables, or milk products 0 No I have three or more drinks of beer, liquor or wine almost every day 0 No I have tooth or mouth problems that make it hard for me to eat 0 No I don't always have enough money to buy the food I need 0 No I eat alone most of the time 0 No I take three or more different prescribed or over-the-counter drugs a day 0 No Without wanting to, I have lost or gained 10 pounds in the last six months 0 No I am not always physically able to shop, cook and/or feed myself 0 No Nutrition Protocols Good Risk Protocol 0 No interventions needed Moderate Risk Protocol High Risk Proctocol Risk Level: Good Risk Score: 0 Electronic Signature(s) Signed: 07/03/2021 5:42:12 PM By: Baruch Gouty RN, BSN Entered By: Baruch Gouty on 07/03/2021 14:01:33

## 2021-07-04 NOTE — Progress Notes (Signed)
SERIAH, BROTZMAN ([id number]) Visit Report for 07/03/2021 Chief Complaint Document Details Patient Name: Date of Service: Misty Arnold, Misty Arnold 07/03/2021 1:15 PM Medical Record Number: [id number] Patient Account Number: [id number] Date of Birth/Sex: Treating RN: 1970-12-25 (50 y.o. Sue Lush Primary Care Provider: Nicholas Lose Other Clinician: Referring Provider: Treating Provider/Extender: Vanessa Barbara Weeks in Treatment: 0 Information Obtained from: Patient Chief Complaint Metastatic breast cancer with open wound to previous mastectomy site Electronic Signature(s) Signed: 07/04/2021 12:00:37 PM By: Kalman Shan DO Entered By: Kalman Shan on 07/04/2021 11:44:23 -------------------------------------------------------------------------------- HPI Details Patient Name: Date of Service: Misty Arnold, Misty Arnold 07/03/2021 1:15 PM Medical Record Number: [id number] Patient Account Number: [id number] Date of Birth/Sex: Treating RN: 16-Dec-1970 (50 y.o. Sue Lush Primary Care Provider: Nicholas Lose Other Clinician: Referring Provider: Treating Provider/Extender: Vanessa Barbara Weeks in Treatment: 0 History of Present Illness HPI Description: Admission 8/29 Misty Arnold is a 50 year old female with a past medical history of metastatic breast cancer (ER/PR positive and HER-2 neg, BRCA neg) status post right mastectomy diagnosed in 2011. She currently takes [place] and zoladex. She states she has only taken oral chemotherapy for her treatment. She would like to follow in our clinic for supplies to help with drainage coming from her right mastectomy surgical site. This has been an ongoing issue for 2 years. 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 She is also hopeful that this will eventually close and heal. She currently denies systemic signs of infection. She has chronic pain for which she takes oxycodone  as needed but rarely does this. For her wound care she is using Xeroform and calcium alginate. She reports having to change the dressing twice daily with a total of 6 ABD pads due to drainage. Electronic Signature(s) Signed: 07/04/2021 12:00:37 PM By: Kalman Shan DO Entered By: Kalman Shan on 07/04/2021 11:59:20 -------------------------------------------------------------------------------- Physical Exam Details Patient Name: Date of Service: Misty Arnold, Misty Arnold 07/03/2021 1:15 PM Medical Record Number: [id number] Patient Account Number: [id number] Date of Birth/Sex: Treating RN: 04/27/1971 (50 y.o. Sue Lush Primary Care Provider: Other Clinician: Nicholas Lose Referring Provider: Treating Provider/Extender: Vanessa Barbara Weeks in Treatment: 0 Constitutional respirations regular, non-labored and within target range for patient.Marland Kitchen Psychiatric pleasant and cooperative. Notes Multiple large masses noted at right mastectomy site. Serous drainage noted. Electronic Signature(s) Signed: 07/04/2021 12:00:37 PM By: Kalman Shan DO Entered By: Kalman Shan on 07/04/2021 11:50:34 -------------------------------------------------------------------------------- Physician Orders Details Patient Name: Date of Service: Misty Arnold, Misty Arnold 07/03/2021 1:15 PM Medical Record Number: [id number] Patient Account Number: [id number] Date of Birth/Sex: Treating RN: [birth year] (50 y.o. 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 - with Dr. Heber [place] Bathing/ Shower/ Hygiene May shower and wash wound with soap and water. - when changing dressing Additional Orders / Instructions Follow Nutritious Diet - Recommend 100-120g of protein daily [place] Orders/Instructions: - [facility]: 1-2x/week for wound care. If possible can HH look into getting and abdominal/chest binder. Wound Treatment Wound #1 - Breast (mastectomy site) Wound Laterality: Right Cleanser: Soap  and Water [facility]) 2 x Per Day/30 Days Discharge Instructions: May shower and wash wound with dial antibacterial soap and water prior to dressing change. Cleanser: Wound Cleanser (Home Health) 2 x Per Day/30 Days Discharge Instructions: Cleanse the wound with wound cleanser prior to applying a clean dressing using gauze sponges, not tissue or cotton balls. Prim Dressing: KerraCel Ag Gelling Fiber Dressing, 4x5 in (silver alginate) (Home Health) 2 x Per Day/30 Days ary Discharge Instructions: Apply silver alginate to wound bed as instructed Secondary Dressing: ABD Pad, 8x10 (Home Health) 2 x Per Day/30 Days Discharge Instructions: Apply over primary dressing as directed. Secondary Dressing: Zetuvit Plus 4x8 in 2 x Per Day/30 Days Discharge Instructions: Or equivalent super absorbant dressing Secured With: mesh netting ([place]) 2 x Per Day/30 Days Add-Ons: Abdominal/Chest Binder ([place]) 2 x Per Day/30 Days Electronic Signature(s) Signed: 07/04/2021 12:00:37 PM By: Kalman Shan DO Previous Signature: 07/03/2021 3:10:42 PM Version By: Lorrin Jackson Entered By: Kalman Shan on 07/04/2021 11:50:51 -------------------------------------------------------------------------------- Problem List Details Patient Name: Date of Service: Misty Arnold, Misty Arnold 07/03/2021 1:15 PM Medical Record Number: [id number] Patient Account Number:  [id number] Date of Birth/Sex: Treating RN: 1971/07/29 (50 y.o. 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 Sue Lush Primary Care Provider: Nicholas Lose Other Clinician: Referring Provider: Treating Provider/Extender: Vanessa Barbara Weeks in Treatment: 0 Subjective Chief Complaint Information obtained from Patient Metastatic breast cancer with open wound to previous mastectomy site History of Present Illness (HPI) Admission 8/29 Misty Arnold is a 50 year old female with a past medical history of metastatic breast cancer (ER/PR positive and HER-2 neg, BRCA neg) status post right mastectomy diagnosed in 2011. She currently takes [place] and zoladex. She states she has only taken oral chemotherapy for her treatment. She would like to follow in our clinic for supplies to help with drainage coming from her right mastectomy surgical site. This has been an ongoing issue for 2 years.  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 She is also hopeful that this will eventually close and heal. She currently denies systemic signs of infection. She has chronic pain for which she takes oxycodone as needed but rarely does this. For her wound care she is using Xeroform and calcium alginate. She reports having to change the dressing twice daily with a total of 6 ABD pads due to drainage. Patient History Information obtained from Patient. Allergies amoxicillin (Reaction: ubnknown), penicillin (Reaction: hives), gelatin, pork, tamoxifen Family History Cancer - Maternal Grandparents,Paternal Grandparents, Diabetes - Mother,Father,Maternal Grandparents,Paternal Grandparents, Heart Disease - Mother,Paternal Grandparents, Hypertension - Mother,Father,Paternal Grandparents, Stroke - Mother,Paternal Grandparents, Thyroid Problems - Paternal Grandparents, No family history of Hereditary Spherocytosis, Kidney Disease, Lung Disease, Seizures, Tuberculosis. Social History Never smoker, Marital Status -  Divorced, Alcohol Use - Never, Drug Use - No History, Caffeine Use - Rarely. Medical History Hematologic/Lymphatic Patient has history of Anemia Cardiovascular Patient has history of Hypertension Endocrine Denies history of Type I Diabetes, Type II Diabetes Integumentary (Skin) Denies history of History of Burn Musculoskeletal Patient has history of Osteoarthritis Neurologic Denies history of Dementia, Neuropathy, Quadriplegia, Paraplegia, Seizure Disorder Oncologic Patient has history of Received Chemotherapy, Received Radiation - 2021 Psychiatric Denies history of Anorexia/bulimia, Confinement Anxiety Hospitalization/Surgery History - right mastectomy. - T9 laminectomy and tumor resection. - hysterectomy. Medical A Surgical History Notes nd Oncologic metastatic breast CA Review of Systems (ROS) Constitutional Symptoms (General Health) Complains or  has symptoms of Fatigue - d/t chemo. Denies complaints or symptoms of Fever, Chills, Marked Weight Change. Eyes Denies complaints or symptoms of Dry Eyes, Vision Changes, Glasses / Contacts. Ear/Nose/Mouth/Throat Denies complaints or symptoms of Chronic sinus problems or rhinitis. Respiratory Denies complaints or symptoms of Chronic or frequent coughs, Shortness of Breath. Cardiovascular Denies complaints or symptoms of Chest pain. Gastrointestinal Denies complaints or symptoms of Frequent diarrhea, Nausea, Vomiting. Endocrine Denies complaints or symptoms of Heat/cold intolerance. Genitourinary Denies complaints or symptoms of Frequent urination. Integumentary (Skin) Complains or has symptoms of Wounds - right breast. Musculoskeletal Complains or has symptoms of Muscle Weakness. Neurologic Complains or has symptoms of Numbness/parasthesias - bil legs. Psychiatric Denies complaints or symptoms of Claustrophobia, Suicidal. Objective Constitutional respirations regular, non-labored and within target range for patient.. Vitals Time Taken: 1:43 PM, Height: 65 in, Source: Stated, Weight: 125 lbs, Source: Stated, BMI: 20.8, Temperature: 98.2 F, Pulse: 106 bpm, Respiratory Rate: 18 breaths/min, Blood Pressure: 149/93 mmHg. Psychiatric pleasant and cooperative. General Notes: Multiple large masses noted at right mastectomy site. Serous drainage noted. Integumentary (Hair, Skin) Wound #1 status is Open. Original cause of wound was Gradually Appeared. The date acquired was: 11/05/2018. The wound is located on the Right Breast (mastectomy site). The wound measures 14.5cm length x 11.5cm width x 1.7cm depth; 130.965cm^2 area and 222.641cm^3 volume. There is Fat Layer (Subcutaneous Tissue) exposed. There is no tunneling or undermining noted. There is a large amount of serous drainage noted. The wound margin is distinct with the outline attached to the wound base. There is medium (34-66%) red,  pink, hyper - granulation within the wound bed. There is a medium (34-66%) amount of necrotic tissue within the wound bed including Adherent Slough. 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 She declined chemotherapy infusions for treatment. Her diagnosis was in 2011. She had a right mastectomy in 2012 and repeat in 2016 due to IDC with involvement of skin. Patient presents with multiple masses to her right mastectomy site with drainage. This has been ongoing for the past 2 years. Patient presents for recommendations on wound care supplies. She is hopeful that this will close. We had a long discussion on the barriers to wound healing and in her case her cancer would not allow this to heal and close. She is still hopeful that she will eventually have skin grafts. At this time I recommended continuing with silver alginate but not use Xeroform. I also recommended zetuvit as an absorbent dressings to help with the drainage. Patient asked about a vest that she could use for added protection. I am unaware of a  vest however I did recommend a possible abdominal/chest binder that she can use to her chest to help keep the dressings in place and she can add extra padding to this area.There were no obvious signs of infection on exam. Patient would like to follow-up monthly and I think this is reasonable. I asked her to let our clinic know if there are any questions or concerns in the meantime. 47 minutes was spent on the patient encounter including face-to-face, EMR review and coordination of care. Plan Follow-up Appointments: Return appointment in 1 month. - with Dr. Marigene Ehlers Shower/ Hygiene: May shower and  wash wound with soap and water. - when changing dressing Additional Orders / Instructions: Follow Nutritious Diet - Recommend 100-120g of protein daily Home Health: Other Home Health Orders/Instructions: - [facility]: 1-2x/week for wound care. If possible can HH look into getting and abdominal/chest binder. WOUND #1: - Breast (mastectomy site) Wound Laterality: Right Cleanser: Soap and Water (Home Health) 2 x Per Day/30 Days Discharge Instructions: May shower and wash wound with dial antibacterial soap and water prior to dressing change. Cleanser: Wound Cleanser (Home Health) 2 x Per Day/30 Days Discharge Instructions: Cleanse the wound with wound cleanser prior to applying a clean dressing using gauze sponges, not tissue or cotton balls. Prim Dressing: KerraCel Ag Gelling Fiber Dressing, 4x5 in (silver alginate) (Home Health) 2 x Per Day/30 Days ary Discharge Instructions: Apply silver alginate to wound bed as instructed Secondary Dressing: ABD Pad, 8x10 (Home Health) 2 x Per Day/30 Days Discharge Instructions: Apply over primary dressing as directed. Secondary Dressing: Zetuvit Plus 4x8 in 2 x Per Day/30 Days Discharge Instructions: Or equivalent super absorbant dressing Secured With: mesh netting (Home Health) 2 x Per Day/30 Days Add-Ons: Abdominal/Chest Binder ([place]) 2 x Per Day/30 Days 1. Silver alginate and zetuvit 2. Follow-up in 1 month Electronic Signature(s) Signed: 07/04/2021 12:00:37 PM By: Kalman Shan DO Entered By: Kalman Shan on 07/04/2021 11:59:39 -------------------------------------------------------------------------------- HxROS Details Patient Name: Date of Service: Misty Arnold, Misty Arnold 07/03/2021 1:15 PM Medical Record Number: [id number] Patient Account Number: [id number] Date of Birth/Sex: Treating RN: [birth year] (50 y.o. Elam Dutch Primary Care Provider: Nicholas Lose Other Clinician: Referring Provider: Treating Provider/Extender:  Vanessa Barbara Weeks in Treatment: 0 Information Obtained From Patient Constitutional Symptoms ([place]) Complaints and Symptoms: Positive for: Fatigue - d/t chemo Negative for: Fever; Chills; Marked Weight Change Eyes Complaints and Symptoms: Negative for: Dry Eyes; Vision Changes; Glasses / Contacts Ear/Nose/Mouth/Throat Complaints and Symptoms: Negative for: Chronic sinus problems or rhinitis Respiratory Complaints and Symptoms: Negative for: Chronic or frequent coughs; Shortness of Breath Cardiovascular Complaints and Symptoms: Negative for: Chest pain Medical History: Positive for: Hypertension Gastrointestinal Complaints and Symptoms: Negative for: Frequent diarrhea; Nausea; Vomiting Endocrine Complaints and Symptoms: Negative for: Heat/cold intolerance Medical History: Negative for: Type I Diabetes; Type II Diabetes Genitourinary Complaints and Symptoms: Negative for: Frequent urination Integumentary (Skin) Complaints and Symptoms: Positive for: Wounds - right breast Medical History: Negative for: History of Burn Musculoskeletal Complaints and Symptoms: Positive for: Muscle Weakness Medical History: Positive for: Osteoarthritis Neurologic Complaints and Symptoms: Positive for: Numbness/parasthesias - bil legs Medical History: Negative for: Dementia; Neuropathy; Quadriplegia; Paraplegia; Seizure Disorder Psychiatric Complaints and Symptoms: Negative for: Claustrophobia; Suicidal Medical History: Negative for: Anorexia/bulimia; Confinement Anxiety Hematologic/Lymphatic Medical History: Positive for: Anemia Immunological Oncologic Medical History: Positive for: Received Chemotherapy; Received Radiation - 2021 Past Medical History Notes: metastatic breast CA Immunizations Pneumococcal Vaccine: Received Pneumococcal Vaccination: No Implantable Devices None Hospitalization /  Surgery History Type of  Hospitalization/Surgery right mastectomy T9 laminectomy and tumor resection hysterectomy Family and Social History Cancer: Yes - Maternal Grandparents,Paternal Grandparents; Diabetes: Yes - Mother,Father,Maternal Grandparents,Paternal Grandparents; Heart Disease: Yes - Mother,Paternal Grandparents; Hereditary Spherocytosis: No; Hypertension: Yes - Mother,Father,Paternal Grandparents; Kidney Disease: No; Lung Disease: No; Seizures: No; Stroke: Yes - Mother,Paternal Grandparents; Thyroid Problems: Yes - Paternal Grandparents; Tuberculosis: No; Never smoker; Marital Status - Divorced; Alcohol Use: Never; Drug Use: No History; Caffeine Use: Rarely; Financial Concerns: No; Food, Clothing or Shelter Needs: No; Support System Lacking: No; Transportation Concerns: Astronomer) Signed: 07/03/2021 5:42:12 PM By: Baruch Gouty RN, BSN Signed: 07/04/2021 12:00:37 PM By: Kalman Shan DO Entered By: Baruch Gouty on 07/03/2021 13:57:52 -------------------------------------------------------------------------------- SuperBill Details Patient Name: Date of Service: Misty Arnold, Misty Arnold 07/03/2021 Medical Record Number: [id number] Patient Account Number: [id number] Date of Birth/Sex: Treating RN: Nov 09, 1970 (50 y.o. 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 Facility Procedures CPT4 Code: [id number] Description: 99214 - WOUND CARE VISIT-LEV 4 EST PT Modifier: 25 Quantity: 1 Physician Procedures : CPT4 Code Description Modifier [id number]  [id number] - WC PHYS LEVEL 4 - NEW PT ICD-10 Diagnosis Description S21.109A Unspecified open wound of unspecified front wall of thorax without penetration into thoracic cavity, ini encounter C50.919 Malignant neoplasm  of unspecified site of unspecified female breast 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

## 2021-07-04 NOTE — Progress Notes (Signed)
SHANNIN, MAYO ([id number]) Visit Report for 07/03/2021 Allergy List Details Patient Name: Date of Service: SHAKIAH, BRINLEE 07/03/2021 1:15 PM Medical Record Number: [id number] Patient Account Number: [id number] Date of Birth/Sex: Treating RN: 10-01-1971 (50 y.o. Elam Dutch Primary Care Zalyn Amend: Nicholas Lose Other Clinician: Referring Wilmar Prabhakar: Treating Kwaku Mostafa/Extender: Vanessa Barbara Weeks in Treatment: 0 Allergies Active Allergies amoxicillin Reaction: ubnknown penicillin Reaction: hives gelatin, pork tamoxifen Allergy Notes Electronic Signature(s) Signed: 07/03/2021 5:42:12 PM By: Baruch Gouty RN, BSN Entered By: Baruch Gouty on 07/03/2021 13:47:40 -------------------------------------------------------------------------------- Arrival Information Details Patient Name: Date of Service: MYELLE, KALAMA 07/03/2021 1:15 PM Medical Record Number: [id number] Patient Account Number: [id number] Date of Birth/Sex: Treating RN: 1971/04/14 (50 y.o. Elam Dutch Primary Care Tyrone Balash: Nicholas Lose Other Clinician: Referring Alexica Schlossberg: Treating Oluwakemi Salsberry/Extender: Vanessa Barbara Weeks in Treatment: 0 Visit Information Patient Arrived: Cane Arrival Time: 13:36 Accompanied By: self Transfer Assistance: None Patient Identification Verified: Yes Secondary Verification Process Completed: Yes Patient Requires Transmission-Based Precautions: No Patient Has Alerts: No Electronic Signature(s) Signed: 07/03/2021 5:42:12 PM By: Baruch Gouty RN, BSN Entered By: Baruch Gouty on 07/03/2021 13:41:13 -------------------------------------------------------------------------------- Clinic Level of Care Assessment Details Patient Name: Date of Service: IDELIA, BOYZO 07/03/2021 1:15 PM Medical Record Number: [id number] Patient Account Number: [id number] Date of Birth/Sex: Treating RN: [birth year] (50 y.o. Sue Lush Primary  Care Lestat Golob: Nicholas Lose Other Clinician: Referring Airika Alkhatib: Treating Jermane Brayboy/Extender: Vanessa Barbara Weeks in Treatment: 0 Clinic Level of Care Assessment Items TOOL 2 Quantity Score X- 1 0 Use when only an EandM is performed on the INITIAL visit ASSESSMENTS - Nursing Assessment / Reassessment X- 1 20 General Physical Exam (combine w/ comprehensive assessment (listed just below) when performed on new pt. evals) X- 1 25 Comprehensive Assessment (HX, ROS, Risk Assessments, Wounds Hx, etc.) ASSESSMENTS - Wound and Skin A ssessment / Reassessment X - Simple Wound Assessment / Reassessment - one wound 1 5 '[]'$  - 0 Complex Wound Assessment / Reassessment - multiple wounds '[]'$  - 0 Dermatologic / Skin Assessment (not related to wound area) ASSESSMENTS - Ostomy and/or Continence Assessment and Care '[]'$  - 0 Incontinence Assessment and Management '[]'$  - 0 Ostomy Care Assessment and Management (repouching, etc.) PROCESS - Coordination of Care '[]'$  - 0 Simple Patient / Family Education for ongoing care X- 1 20 Complex (extensive) Patient / Family Education for ongoing care X- 1 10 Staff obtains Programmer, systems, Records, T Results / Process Orders est X- 1 10 Staff telephones HHA, Nursing Homes / Clarify orders / etc '[]'$  - 0 Routine Transfer to another Facility (non-emergent condition) '[]'$  - 0 Routine Hospital Admission (non-emergent condition) '[]'$  - 0 New Admissions / Biomedical engineer / Ordering NPWT Apligraf, etc. , '[]'$  - 0 Emergency Hospital Admission (emergent condition) '[]'$  - 0 Simple Discharge Coordination '[]'$  - 0 Complex (extensive) Discharge Coordination PROCESS - Special Needs '[]'$  - 0 Pediatric / Minor Patient Management '[]'$  - 0 Isolation Patient Management '[]'$  - 0 Hearing / Language / Visual special needs '[]'$  - 0 Assessment of Community assistance (transportation, D/C planning, etc.) '[]'$  - 0 Additional assistance / Altered mentation '[]'$  - 0 Support  Surface(s) Assessment (bed, cushion, seat, etc.) INTERVENTIONS - Wound Cleansing / Measurement X- 1 5 Wound Imaging (photographs - any number of wounds) '[]'$  - 0 Wound Tracing (instead of photographs) X- 1 5 Simple Wound Measurement - one wound '[]'$  - 0 Complex Wound Measurement - multiple wounds X- 1 5 Simple Wound Cleansing - one wound '[]'$  -  0 Complex Wound Cleansing - multiple wounds INTERVENTIONS - Wound Dressings '[]'$  - 0 Small Wound Dressing one or multiple wounds X- 1 15 Medium Wound Dressing one or multiple wounds '[]'$  - 0 Large Wound Dressing one or multiple wounds '[]'$  - 0 Application of Medications - injection INTERVENTIONS - Miscellaneous '[]'$  - 0 External ear exam '[]'$  - 0 Specimen Collection (cultures, biopsies, blood, body fluids, etc.) '[]'$  - 0 Specimen(s) / Culture(s) sent or taken to Lab for analysis '[]'$  - 0 Patient Transfer (multiple staff / Harrel Lemon Lift / Similar devices) '[]'$  - 0 Simple Staple / Suture removal (25 or less) '[]'$  - 0 Complex Staple / Suture removal (26 or more) '[]'$  - 0 Hypo / Hyperglycemic Management (close monitor of Blood Glucose) '[]'$  - 0 Ankle / Brachial Index (ABI) - do not check if billed separately Has the patient been seen at the hospital within the last three years: Yes Total Score: 120 Level Of Care: New/Established - Level 4 Electronic Signature(s) Signed: 07/03/2021 5:25:35 PM By: Lorrin Jackson Entered By: Lorrin Jackson on 07/03/2021 14:39:07 -------------------------------------------------------------------------------- Lower Extremity Assessment Details Patient Name: Date of Service: LAKIE, ARNOLD 07/03/2021 1:15 PM Medical Record Number: [id number] Patient Account Number: [id number] Date of Birth/Sex: Treating RN: 12-17-1970 (50 y.o. Debby Bud Primary Care Anamari Galeas: Nicholas Lose Other Clinician: Referring Allysson Rinehimer: Treating Navaya Wiatrek/Extender: Vanessa Barbara Weeks in Treatment: 0 Electronic  Signature(s) Signed: 07/03/2021 5:13:20 PM By: Deon Pilling Signed: 07/03/2021 5:42:12 PM By: Baruch Gouty RN, BSN Entered By: Baruch Gouty on 07/03/2021 14:01:59 -------------------------------------------------------------------------------- Multi Wound Chart Details Patient Name: Date of Service: TRENEE, MATTE 07/03/2021 1:15 PM Medical Record Number: [id number] Patient Account Number: [id number] Date of Birth/Sex: Treating RN: Dec 25, 1970 (50 y.o. Sue Lush Primary Care Reine Bristow: Nicholas Lose Other Clinician: Referring Dave Mergen: Treating Zaidyn Claire/Extender: Vanessa Barbara Weeks in Treatment: 0 Vital Signs Height(in): 65 Pulse(bpm): 106 Weight(lbs): 125 Blood Pressure(mmHg): 149/93 Body Mass Index(BMI): 21 Temperature(F): 98.2 Respiratory Rate(breaths/min): 18 Photos: [N/A:N/A] Right Breast (mastectomy site) N/A N/A Wound Location: Gradually Appeared N/A N/A Wounding Event: Malignant Wound N/A N/A Primary Etiology: Anemia, Hypertension, Osteoarthritis, N/A N/A Comorbid History: Received Chemotherapy, Received Radiation 11/05/2018 N/A N/A Date Acquired: 0 N/A N/A Weeks of Treatment: Open N/A N/A Wound Status: 14.5x11.5x1.7 N/A N/A Measurements L x W x D (cm) 130.965 N/A N/A A (cm) : rea 222.641 N/A N/A Volume (cm) : 0.00% N/A N/A % Reduction in Area: 0.00% N/A N/A % Reduction in Volume: Full Thickness Without Exposed N/A N/A Classification: Support Structures Large N/A N/A Exudate Amount: Serous N/A N/A Exudate Type: amber N/A N/A Exudate Color: Distinct, outline attached N/A N/A Wound Margin: Medium (34-66%) N/A N/A Granulation Amount: Red, Pink, Hyper-granulation N/A N/A Granulation Quality: Medium (34-66%) N/A N/A Necrotic Amount: Fat Layer (Subcutaneous Tissue): Yes N/A N/A Exposed Structures: Fascia: No Tendon: No Muscle: No Joint: No Bone: No Small (1-33%) N/A N/A Epithelialization: Treatment  Notes Wound #1 (Breast (mastectomy site)) Wound Laterality: Right Cleanser Soap and Water Discharge Instruction: May shower and wash wound with dial antibacterial soap and water prior to dressing change. Wound Cleanser Discharge Instruction: Cleanse the wound with wound cleanser prior to applying a clean dressing using gauze sponges, not tissue or cotton balls. Peri-Wound Care Topical Primary Dressing KerraCel Ag Gelling Fiber Dressing, 4x5 in (silver alginate) Discharge Instruction: Apply silver alginate to wound bed as instructed Secondary Dressing ABD Pad, 8x10 Discharge Instruction: Apply over primary dressing as directed. Zetuvit Plus 4x8 in Discharge Instruction: Apply over primary  dressing as directed. Secured With mesh netting Compression Wrap Compression Stockings Add-Ons Architect) Signed: 07/04/2021 12:00:37 PM By: Kalman Shan DO Signed: 07/04/2021 5:23:58 PM By: Lorrin Jackson Previous Signature: 07/03/2021 5:25:35 PM Version By: Fara Chute By: Kalman Shan on 07/04/2021 11:44:08 -------------------------------------------------------------------------------- Multi-Disciplinary Care Plan Details Patient Name: Date of Service: LATORIE, VECELLIO 07/03/2021 1:15 PM Medical Record Number: [id number] Patient Account Number: [id number] Date of Birth/Sex: Treating RN: [birth year] (50 y.o. Sue Lush Primary Care Veverly Larimer: Nicholas Lose Other Clinician: Referring Wyn Nettle: Treating Maribel Hadley/Extender: Vanessa Barbara Weeks in Treatment: 0 Active Inactive Wound/Skin Impairment Nursing Diagnoses: Impaired tissue integrity Goals: Patient/caregiver will verbalize understanding of skin care regimen Date Initiated: 07/03/2021 Target Resolution Date: 07/31/2021 Goal Status: Active Ulcer/skin breakdown will have a volume reduction of 30% by week 4 Date Initiated: 07/03/2021 Target Resolution Date:  07/31/2021 Goal Status: Active Interventions: Assess patient/caregiver ability to obtain necessary supplies Assess patient/caregiver ability to perform ulcer/skin care regimen upon admission and as needed Assess ulceration(s) every visit Provide education on ulcer and skin care Treatment Activities: Topical wound management initiated : 07/03/2021 Notes: Electronic Signature(s) Signed: 07/03/2021 5:25:35 PM By: Lorrin Jackson Entered By: Lorrin Jackson on 07/03/2021 14:37:45 -------------------------------------------------------------------------------- Pain Assessment Details Patient Name: Date of Service: ANASTASIJA, COCKBURN 07/03/2021 1:15 PM Medical Record Number: [id number] Patient Account Number: [id number] Date of Birth/Sex: Treating RN: [birth year] (50 y.o. Debby Bud Primary Care Zyshonne Malecha: Nicholas Lose Other Clinician: Referring Reonna Finlayson: Treating Marea Reasner/Extender: Vanessa Barbara Weeks in Treatment: 0 Active Problems Location of Pain Severity and Description of Pain Patient Has Paino No Site Locations Rate the pain. Current Pain Level: 0 Pain Management and Medication Current Pain Management: Medication: No Cold Application: No Rest: No Massage: No Activity: No T.E.N.S.: No Heat Application: No Leg drop or elevation: No Is the Current Pain Management Adequate: Adequate How does your wound impact your activities of daily livingo Sleep: No Bathing: No Appetite: No Relationship With Others: No Bladder Continence: No Emotions: No Bowel Continence: No Work: No Toileting: No Drive: No Dressing: No Hobbies: No Electronic Signature(s) Signed: 07/03/2021 5:13:20 PM By: Deon Pilling Entered By: Deon Pilling on 07/03/2021 13:58:54 -------------------------------------------------------------------------------- Patient/Caregiver Education Details Patient Name: Date of Service: Roe Coombs 8/29/2022andnbsp1:15 PM Medical Record  Number: [id number] Patient Account Number: [id number] Date of Birth/Gender: Treating RN: 04-19-1971 (50 y.o. Sue Lush Primary Care Physician: Nicholas Lose Other Clinician: Referring Physician: Treating Physician/Extender: Vanessa Barbara Weeks in Treatment: 0 Education Assessment Education Provided To: Patient Education Topics Provided Wound/Skin Impairment: Methods: Demonstration, Explain/Verbal, Printed Responses: State content correctly Electronic Signature(s) Signed: 07/03/2021 5:25:35 PM By: Lorrin Jackson Entered By: Lorrin Jackson on 07/03/2021 14:37:58 -------------------------------------------------------------------------------- Wound Assessment Details Patient Name: Date of Service: VICI, MARZE 07/03/2021 1:15 PM Medical Record Number: [id number] Patient Account Number: [id number] Date of Birth/Sex: Treating RN: Jul 13, 1971 (50 y.o. Helene Shoe, Tammi Klippel Primary Care Tabor Bartram: Nicholas Lose Other Clinician: Referring Derian Pfost: Treating Saree Krogh/Extender: Vanessa Barbara Weeks in Treatment: 0 Wound Status Wound Number: 1 Primary Malignant Wound Etiology: Wound Location: Right Breast (mastectomy site) Wound Open Wounding Event: Gradually Appeared Status: Date Acquired: 11/05/2018 Comorbid Anemia, Hypertension, Osteoarthritis, Received Chemotherapy, Weeks Of Treatment: 0 History: Received Radiation Clustered Wound: No Photos Wound Measurements Length: (cm) 14.5 Width: (cm) 11.5 Depth: (cm) 1.7 Area: (cm) 130.965 Volume: (cm) 222.641 % Reduction in Area: 0% % Reduction in Volume: 0% Epithelialization: Small (1-33%) Tunneling: No Undermining: No Wound Description Classification: Full Thickness Without Exposed Support Structures  Wound Margin: Distinct, outline attached Exudate Amount: Large Exudate Type: Serous Exudate Color: amber Foul Odor After Cleansing: No Slough/Fibrino Yes Wound Bed Granulation Amount:  Medium (34-66%) Exposed Structure Granulation Quality: Red, Pink, Hyper-granulation Fascia Exposed: No Necrotic Amount: Medium (34-66%) Fat Layer (Subcutaneous Tissue) Exposed: Yes Necrotic Quality: Adherent Slough Tendon Exposed: No Muscle Exposed: No Joint Exposed: No Bone Exposed: No Treatment Notes Wound #1 (Breast (mastectomy site)) Wound Laterality: Right Cleanser Soap and Water Discharge Instruction: May shower and wash wound with dial antibacterial soap and water prior to dressing change. Wound Cleanser Discharge Instruction: Cleanse the wound with wound cleanser prior to applying a clean dressing using gauze sponges, not tissue or cotton balls. Peri-Wound Care Topical Primary Dressing KerraCel Ag Gelling Fiber Dressing, 4x5 in (silver alginate) Discharge Instruction: Apply silver alginate to wound bed as instructed Secondary Dressing ABD Pad, 8x10 Discharge Instruction: Apply over primary dressing as directed. Zetuvit Plus 4x8 in Discharge Instruction: Apply over primary dressing as directed. Secured With mesh netting Compression Wrap Compression Stockings Add-Ons Architect) Signed: 07/03/2021 5:13:20 PM By: Deon Pilling Entered By: Deon Pilling on 07/03/2021 13:58:21 -------------------------------------------------------------------------------- Vitals Details Patient Name: Date of Service: SHAKYA, FEUER 07/03/2021 1:15 PM Medical Record Number: [id number] Patient Account Number: [id number] Date of Birth/Sex: Treating RN: July 28, 1971 (50 y.o. Elam Dutch Primary Care Kayhan Boardley: Nicholas Lose Other Clinician: Referring Dlynn Ranes: Treating Jabre Heo/Extender: Vanessa Barbara Weeks in Treatment: 0 Vital Signs Time Taken: 13:43 Temperature (F): 98.2 Height (in): 65 Pulse (bpm): 106 Source: Stated Respiratory Rate (breaths/min): 18 Weight (lbs): 125 Blood Pressure (mmHg): 149/93 Source:  Stated Reference Range: 80 - 120 mg / dl Body Mass Index (BMI): 20.8 Electronic Signature(s) Signed: 07/03/2021 5:42:12 PM By: Baruch Gouty RN, BSN Entered By: Baruch Gouty on 07/03/2021 13:44:23

## 2021-07-06 DIAGNOSIS — C773 Secondary and unspecified malignant neoplasm of axilla and upper limb lymph nodes: Secondary | ICD-10-CM | POA: Diagnosis not present

## 2021-07-06 DIAGNOSIS — C50411 Malignant neoplasm of upper-outer quadrant of right female breast: Secondary | ICD-10-CM | POA: Diagnosis not present

## 2021-07-14 ENCOUNTER — Telehealth: Payer: Self-pay | Admitting: *Deleted

## 2021-07-14 DIAGNOSIS — C50411 Malignant neoplasm of upper-outer quadrant of right female breast: Secondary | ICD-10-CM | POA: Diagnosis not present

## 2021-07-14 DIAGNOSIS — Z4800 Encounter for change or removal of nonsurgical wound dressing: Secondary | ICD-10-CM | POA: Diagnosis not present

## 2021-07-14 DIAGNOSIS — Z9011 Acquired absence of right breast and nipple: Secondary | ICD-10-CM | POA: Diagnosis not present

## 2021-07-14 DIAGNOSIS — I10 Essential (primary) hypertension: Secondary | ICD-10-CM | POA: Diagnosis not present

## 2021-07-14 DIAGNOSIS — Z170 Estrogen receptor positive status [ER+]: Secondary | ICD-10-CM | POA: Diagnosis not present

## 2021-07-14 DIAGNOSIS — C7951 Secondary malignant neoplasm of bone: Secondary | ICD-10-CM | POA: Diagnosis not present

## 2021-07-14 NOTE — Telephone Encounter (Signed)
Received call from pt with complaint of severe pain.  Pt states she is concerned with taking Oxycodone and is only taking it every 12 hours.  Prescription is prescribed as every 3 hours PRN.  Pt educated pt on taking Oxycodone 10 mg p.o more frequently to avoid experiencing severe pain.  Pt agreeable and states she will take Oxycodone q6 hours to help control pain.  Pt also requesting telephone visit with MD to discuss Caris results.  Appt scheduled and pt verbalized understanding of date and time.

## 2021-07-15 NOTE — Progress Notes (Signed)
HEMATOLOGY-ONCOLOGY TELEPHONE VISIT PROGRESS NOTE  I connected with Misty Arnold on 07/17/2021 at  2:30 PM EDT by telephone and verified that I am speaking with the correct person using two identifiers.  I discussed the limitations, risks, security and privacy concerns of performing an evaluation and management service by telephone and the availability of in person appointments.  I also discussed with the patient that there may be a patient responsible charge related to this service. The patient expressed understanding and agreed to proceed.   History of Present Illness: Misty Arnold is a 50 y.o. female with above-mentioned history of metastatic breast cancer who is currently  letrozole and Zoladex. She presents via telephone today for follow-up.  She is tolerating her treatment reasonably well but she has a fungating tumor that often bleeds and has a foul-smelling odor.  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 But she could not afford these trips.   01/08/2014 Breast MRI   right  breast multiple enhancing masses within the soft tissues largest 5.6 cm in wall skin surface and the capsule of the silicone prosthesis, contiguous nodules involving in inferomedial breast and tired and subcentimeter nodules across the midline   08/29/2015 Surgery   Right mastectomy: IDC with invol of skin and skin ulceration, breast capsule inv cancer, superior medial margin positive, 1/1 LN positive, grade 3, 14.3 cm, 3.5 cm, ALI, chest wall involv, ER 90-100%, PR 80-90%, HER-2 negative, Ki 67 40-50% T4CN1 (St 3B)   03/05/2016 -  Anti-estrogen oral therapy   Tamoxifen 20 mg daily stopped due to headache and uncontrolled hypertension. Decrease to 10 mg daily 04/03/2016, stopped June 2017 and took an estrogen metabolizer over-the-counter; started Aromasin April 2018 from a [place] physician, Zoladex started on 05/2017 and switched to Letrozole in 09/2017   01/04/2020 Surgery   Patient presented to the ED on 01/04/20 for worsening lower extremity numbness and underwent a decompressive laminectomy with tumor resection at T9 that was complicated with acute blood loss anemia and leukocytosis.   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   Treatment summary: 1. Right  mastectomy 08/29/2015 at [facility] (Dr.Singh): IDC with invol of skin and skin ulceration, breast capsule inv cancer, superior medial margin positive, 1/1 LN positive, grade 3, 14.3 cm, 3.5 cm, ALI, chest wall involv, ER 90-100%, PR 80-90%, HER-2 negative, Ki 67 40-50% T4CN1 (St 3B) 2. Patient started tamoxifen but developed severe headache with severe hypertension and tamoxifen was discontinued 03/17/2016.  3. 02/2017 started on Exemestane, Zoladex started 05/29/2017 4.  Hospitalization for paraplegia: T9 vertebral body compression status post decompressive laminectomy and tumor resection 01/04/2020 5.  Palliative radiation to the thoracic spine 01/28/2020 6.  Ibrance with letrozole started April 2021 ----------------------------------------------------------------------------------------------------------------- 03/17/2021: CT CAP: Redemonstrated bulky mass right breast with multiple metastatic lymph nodes and soft tissue lesions around the chest wall enlarged compared to before.  Multiple expansile mixed lytic and sclerotic bone lesions, worsening metastatic disease in the large lytic lesion posterior aspect of T8 and T9 vertebral bodies   05/11/2021: CT angiogram: Metastatic breast cancer soft tissue breast masses greater on the right, axillary supraclavicular lymphadenopathy, multiple bone metastases   Fatigue: Start B-12 injections monthly, Faslodex monthly   Right axillary lymph node biopsy 06/15/2021: Metastatic mammary carcinoma, sent for Caris molecular testing.  Awaiting the results of these tests to determine if a change in treatment is warranted. Clinically patient thinks that the tumor is shrinking.   Guardant 360 05/30/2021: No reportable alterations Caris molecular testing: ER/PR positive, HER2 low, TMB low,: Recommended switching treatment to [place].  She will read about it and will inform us of her decision. continue with monthly B12 and do Zoladex injections.    I discussed the  assessment and treatment plan with the patient. The patient was provided an opportunity to ask questions and all were answered. The patient agreed with the plan and demonstrated an understanding of the instructions. The patient was advised to call back or seek an in-person evaluation if the symptoms worsen or if the condition fails to improve as anticipated.   Total time spent: 15 mins including non-face to face time and time spent for planning, charting and coordination of care  Rulon Eisenmenger, MD 07/17/2021    I, Thana Ates, am acting as scribe for Nicholas Lose, MD.  I have reviewed the above documentation for accuracy and completeness, and I agree with the above.

## 2021-07-17 ENCOUNTER — Inpatient Hospital Stay: Payer: Medicare PPO | Attending: Hematology and Oncology | Admitting: Hematology and Oncology

## 2021-07-17 DIAGNOSIS — Z5111 Encounter for antineoplastic chemotherapy: Secondary | ICD-10-CM | POA: Insufficient documentation

## 2021-07-17 DIAGNOSIS — C50411 Malignant neoplasm of upper-outer quadrant of right female breast: Secondary | ICD-10-CM | POA: Diagnosis not present

## 2021-07-17 DIAGNOSIS — Z79899 Other long term (current) drug therapy: Secondary | ICD-10-CM | POA: Insufficient documentation

## 2021-07-17 DIAGNOSIS — Z170 Estrogen receptor positive status [ER+]: Secondary | ICD-10-CM | POA: Diagnosis not present

## 2021-07-17 DIAGNOSIS — C7951 Secondary malignant neoplasm of bone: Secondary | ICD-10-CM | POA: Insufficient documentation

## 2021-07-17 DIAGNOSIS — C773 Secondary and unspecified malignant neoplasm of axilla and upper limb lymph nodes: Secondary | ICD-10-CM | POA: Insufficient documentation

## 2021-07-17 NOTE — Assessment & Plan Note (Signed)
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  Treatment summary: 1.Right mastectomy 08/29/2015 at [facility] (Dr.Singh): IDC with invol of skin and skin ulceration, breast capsule inv cancer, superior medial margin positive, 1/1 LN positive, grade 3, 14.3 cm, 3.5 cm, ALI, chest wall involv, ER 90-100%, PR 80-90%, HER-2 negative, Ki 67 40-50% T4CN1 (St 3B) 2.Patient started tamoxifen but developed severe headache with severe hypertension and tamoxifen was discontinued 03/17/2016. 3.02/2017 started on Exemestane, Zoladex started 05/29/2017 4.Hospitalization for paraplegia: T9 vertebral body compression status post decompressive laminectomy and tumor resection 01/04/2020 5.Palliative radiation to the thoracic spine 01/28/2020 6.Ibrance with letrozole started April 2021 ----------------------------------------------------------------------------------------------------------------- 03/17/2021: CT CAP: Redemonstrated bulky mass right breast with multiple metastatic lymph nodes and soft tissue lesions around the chest wall enlarged compared to before. Multiple expansile mixed lytic and sclerotic bone lesions, worsening metastatic disease in the large lytic lesion posterior aspect of T8 and T9 vertebral bodies  05/11/2021: CT angiogram: Metastatic breast cancer soft tissue breast masses greater on the right, axillary supraclavicular lymphadenopathy, multiple bone metastases  Fatigue:Start B-12 injections monthly, Faslodex monthly  Right axillary lymph node biopsy 06/15/2021: Metastatic mammary carcinoma, sent for Caris molecular testing.  Awaiting the results of these tests to determine if a change in treatment is  warranted. Clinically patient thinks that the tumor is shrinking.  Guardant 360 05/30/2021: No reportable alterations  Continue with monthly B12 and do Zoladex injections. 

## 2021-07-19 ENCOUNTER — Encounter (HOSPITAL_COMMUNITY): Payer: Self-pay

## 2021-07-19 ENCOUNTER — Other Ambulatory Visit (HOSPITAL_COMMUNITY): Payer: Self-pay

## 2021-07-19 DIAGNOSIS — Z4800 Encounter for change or removal of nonsurgical wound dressing: Secondary | ICD-10-CM | POA: Diagnosis not present

## 2021-07-19 DIAGNOSIS — I10 Essential (primary) hypertension: Secondary | ICD-10-CM | POA: Diagnosis not present

## 2021-07-19 DIAGNOSIS — C7951 Secondary malignant neoplasm of bone: Secondary | ICD-10-CM | POA: Diagnosis not present

## 2021-07-19 DIAGNOSIS — Z9011 Acquired absence of right breast and nipple: Secondary | ICD-10-CM | POA: Diagnosis not present

## 2021-07-19 DIAGNOSIS — C50411 Malignant neoplasm of upper-outer quadrant of right female breast: Secondary | ICD-10-CM | POA: Diagnosis not present

## 2021-07-19 DIAGNOSIS — Z170 Estrogen receptor positive status [ER+]: Secondary | ICD-10-CM | POA: Diagnosis not present

## 2021-07-20 ENCOUNTER — Other Ambulatory Visit (HOSPITAL_COMMUNITY): Payer: Self-pay

## 2021-07-22 NOTE — Progress Notes (Signed)
Patient Care Team: Nicholas Lose, MD as PCP - General (Hematology and Oncology)  DIAGNOSIS:    ICD-10-CM   1. Metastatic breast cancer ([place])  C50.919     2. Malignant neoplasm of upper-outer quadrant of right breast in female, estrogen receptor positive ([place])  C50.411    Z17.0       SUMMARY OF ONCOLOGIC HISTORY: Oncology History  Breast cancer of upper-outer quadrant of right female breast ([place])  09/21/2010 Mammogram   right breast linear and segmental pleomorphic calcifications from 4:00 to 6:00 position ultrasound revealed 1.6 cm and 1.1 cm masses   09/27/2010 Initial Biopsy   ultrasound-guided biopsy of all masses showed DCIS grade 2; patient went to cancer treatment centers of [place] for second opinion and delayed therapy   01/26/2011 Surgery   right mastectomy followed by reconstruction: 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 But she could not afford these trips.   01/08/2014 Breast MRI   right breast multiple enhancing masses within the soft tissues largest 5.6 cm in wall skin surface and the capsule of the silicone prosthesis, contiguous nodules involving in inferomedial breast and tired and subcentimeter nodules across the midline   08/29/2015 Surgery   Right mastectomy: IDC with invol of skin and skin ulceration, breast capsule inv cancer, superior medial margin positive, 1/1 LN positive, grade 3, 14.3 cm, 3.5 cm, ALI, chest wall involv, ER 90-100%, PR 80-90%, HER-2 negative, Ki 67 40-50% T4CN1 (St 3B)   03/05/2016 -  Anti-estrogen oral therapy    Tamoxifen 20 mg daily stopped due to headache and uncontrolled hypertension. Decrease to 10 mg daily 04/03/2016, stopped June 2017 and took an estrogen metabolizer over-the-counter; started Aromasin April 2018 from a [place] physician, Zoladex started on 05/2017 and switched to Letrozole in 09/2017   01/04/2020 Surgery   Patient presented to the ED on 01/04/20 for worsening lower extremity numbness and underwent a decompressive laminectomy with tumor resection at T9 that was complicated with acute blood loss anemia and leukocytosis.   01/28/2020 - 02/15/2020 Radiation Therapy   Palliative radiation at site of thoracic tumor resection   03/03/2020 Miscellaneous    Ibrance with exemestane and Zoladex    Miscellaneous   Guardant 360: ESR 1 mutations, PI K3 CA mutation, EGFR mutation, T p53 mutation   10/11/2020 Miscellaneous   Alpelisib with letrozole and Zoladex     CHIEF COMPLIANT: Follow-up of metastatic breast cancer   INTERVAL HISTORY: Misty Arnold is a 50 y.o. with above-mentioned history of metastatic breast cancer who is currently  letrozole and Zoladex. She presents to the clinic today for follow-up.  She has profound wound care issues for which she is seeing wound care clinic.  They are going to start her on some wound dressings that will help her.  She is here today to discuss the role of Enhertu and treatment of her metastatic breast cancer.  ALLERGIES:  is allergic to amoxicillin, penicillins, pork-derived products, penicillin g, and tamoxifen.  MEDICATIONS:  Current Outpatient Medications  Medication Sig Dispense Refill   acetaminophen (TYLENOL) 325 MG tablet Take 2 tablets (650 mg total) by  mouth every 4 (four) hours as needed for mild pain ((score 1 to 3) or temp > 100.5). 30 tablet 0   amLODipine (NORVASC) 10 MG tablet Take 1 tablet (10 mg total) by mouth daily. 90 tablet 3   Ascorbic Acid (VITAMIN C PO) Take by mouth.     Bioflavonoid Products (ESTER C PO) Take 1 tablet by  mouth with breakfast, with lunch, and with evening meal.      bisacodyl (DULCOLAX) 10 MG suppository Place 1 suppository (10 mg total) rectally daily as needed for moderate constipation. 12 suppository 0   BLACK CURRANT SEED OIL PO Take by mouth.     Calcium Carb-Cholecalciferol (OYSTER SHELL CALCIUM W/D) 500-200 MG-UNIT TABS TAKE 1 TABLET BY MOUTH TWICE A DAY WITH MEALS 180 tablet 1   Cholecalciferol ([employer] D3) 125 MCG (5000 UT) capsule Take 1 capsule (5,000 Units total) by mouth 2 (two) times daily. 180 capsule 3   diazepam (VALIUM) 5 MG tablet Take 1-2 tablets (5-10 mg total) by mouth every 6 (six) hours as needed for muscle spasms. 30 tablet 0   Flaxseed Oil OIL Take 5 mLs by mouth in the morning, at noon, and at bedtime.      goserelin (ZOLADEX) 3.6 MG injection Inject 3.6 mg into the skin every 28 (twenty-eight) days.     letrozole (FEMARA) 2.5 MG tablet TAKE 1 TABLET BY MOUTH EVERY DAY 90 tablet 1   lidocaine-prilocaine (EMLA) cream APPLY TOPICALLY AS NEEDED. 30 g 1   Oxycodone HCl 10 MG TABS Take 1 tablet (10 mg total) by mouth every 3 (three) hours as needed. 30 tablet 0   palbociclib (IBRANCE) 125 MG tablet TAKE 1 TABLET (125 MG TOTAL) BY MOUTH DAILY. TAKE FOR 21 DAYS ON, 7 DAYS OFF, REPEAT EVERY 28 DAYS. 21 tablet 2   polyethylene glycol (MIRALAX / GLYCOLAX) 17 g packet Take 17 g by mouth daily as needed for mild constipation. 14 each 0   No current facility-administered medications for this visit.    PHYSICAL EXAMINATION: ECOG PERFORMANCE STATUS: 2 - Symptomatic, <50% confined to bed  Vitals:   07/24/21 1540  BP: (!) 150/84  Pulse: 94  Resp: 18  Temp: 97.7 F (36.5 C)  SpO2: 100%   Filed Weights   07/24/21 1540  Weight: 128 lb 6.4 oz (58.2 kg)       LABORATORY DATA:  I have reviewed the data as listed CMP Latest Ref Rng & Units 06/22/2021 05/26/2021 05/13/2021  Glucose 70 - 99 mg/dL 117(H) 141(H) 102(H)  BUN 6 - 20 mg/dL 15 10 17   Creatinine 0.44 - 1.00 mg/dL 1.19(H)  1.11(H) 1.02(H)  Sodium 135 - 145 mmol/L 141 143 137  Potassium 3.5 - 5.1 mmol/L 3.9 4.1 4.1  Chloride 98 - 111 mmol/L 103 106 103  CO2 22 - 32 mmol/L 27 25 26   Calcium 8.9 - 10.3 mg/dL 9.6 9.4 9.4  Total Protein 6.5 - 8.1 g/dL 7.4 7.3 -  Total Bilirubin 0.3 - 1.2 mg/dL 0.4 0.3 -  Alkaline Phos 38 - 126 U/L 99 87 -  AST 15 - 41 U/L 28 29 -  ALT 0 - 44 U/L 16 14 -    Lab Results  Component Value Date   WBC 3.1 (L) 06/22/2021   HGB 11.7 (L) 06/22/2021   HCT 34.1 (L) 06/22/2021   MCV 98.8 06/22/2021   PLT 358 06/22/2021   NEUTROABS 1.9 06/22/2021    ASSESSMENT & PLAN:  Breast cancer of upper-outer quadrant  of right female breast ([place]) Recurrent right breast cancer initially DCIS treated with mastectomy followed by reconstruction and later developed chest wall recurrence in 2013 ER/PR positive HER-2 negative, patient underwent alternative therapies in [place] but could not afford the trips and hence she has not been on any breast cancer therapy for a long time.   Treatment summary: 1. Right mastectomy 08/29/2015 at [facility] (Dr.Singh): IDC with invol of skin and skin ulceration, breast capsule inv cancer, superior medial margin positive, 1/1 LN positive, grade 3, 14.3 cm, 3.5 cm, ALI, chest wall involv, ER 90-100%, PR 80-90%, HER-2 negative, Ki 67 40-50% T4CN1 (St 3B) 2. Patient started tamoxifen but developed severe headache with severe hypertension and tamoxifen was discontinued 03/17/2016.  3. 02/2017 started on Exemestane, Zoladex started 05/29/2017 4.  Hospitalization for paraplegia: T9 vertebral body compression status post decompressive laminectomy and tumor resection 01/04/2020 5.  Palliative radiation to the thoracic spine 01/28/2020 6.  Ibrance with letrozole started April 2021 discontinued 07/24/2021 7.  Enhertu starting 07/31/2021 every 4 weeks with Zoladex ----------------------------------------------------------------------------------------------------------------- 05/11/2021: CT  angiogram: Metastatic breast cancer soft tissue breast masses greater on the right, axillary supraclavicular lymphadenopathy, multiple bone metastases   Treatment plan: Enhertu, Zoladex monthly (because of Zoladex is every 4 weeks, we decided to treat her with Enhertu every 4 weeks as well since she has significant difficulties with getting around for appointments.   Right axillary lymph node biopsy 06/15/2021: Metastatic mammary carcinoma,   Guardant 360 05/30/2021: No reportable alterations Caris molecular testing: ER/PR positive, HER2 low, TMB low,: Recommended switching treatment to Enhertu.  She will read about it and will inform us of her decision.  Return to clinic with the start of Enhertu. No orders of the defined types were placed in this encounter.  The patient has a good understanding of the overall plan. she agrees with it. she will call with any problems that may develop before the next visit here.  Total time spent: 30 mins including face to face time and time spent for planning, charting and coordination of care  Rulon Eisenmenger, MD, MPH 07/24/2021  I, Misty Arnold, am acting as scribe for Dr. Nicholas Lose.  I have reviewed the above documentation for accuracy and completeness, and I agree with the above.

## 2021-07-24 ENCOUNTER — Other Ambulatory Visit: Payer: Self-pay

## 2021-07-24 ENCOUNTER — Encounter (HOSPITAL_BASED_OUTPATIENT_CLINIC_OR_DEPARTMENT_OTHER): Payer: Medicare PPO | Attending: Internal Medicine | Admitting: Internal Medicine

## 2021-07-24 ENCOUNTER — Inpatient Hospital Stay (HOSPITAL_BASED_OUTPATIENT_CLINIC_OR_DEPARTMENT_OTHER): Payer: Medicare PPO | Admitting: Hematology and Oncology

## 2021-07-24 ENCOUNTER — Inpatient Hospital Stay: Payer: Medicare PPO

## 2021-07-24 VITALS — BP 150/84 | HR 94 | Temp 97.7°F | Resp 18 | Ht 64.0 in | Wt 128.4 lb

## 2021-07-24 DIAGNOSIS — Z9011 Acquired absence of right breast and nipple: Secondary | ICD-10-CM | POA: Insufficient documentation

## 2021-07-24 DIAGNOSIS — X58XXXA Exposure to other specified factors, initial encounter: Secondary | ICD-10-CM | POA: Insufficient documentation

## 2021-07-24 DIAGNOSIS — G8929 Other chronic pain: Secondary | ICD-10-CM | POA: Diagnosis not present

## 2021-07-24 DIAGNOSIS — Z809 Family history of malignant neoplasm, unspecified: Secondary | ICD-10-CM | POA: Insufficient documentation

## 2021-07-24 DIAGNOSIS — Z90710 Acquired absence of both cervix and uterus: Secondary | ICD-10-CM | POA: Insufficient documentation

## 2021-07-24 DIAGNOSIS — C50411 Malignant neoplasm of upper-outer quadrant of right female breast: Secondary | ICD-10-CM | POA: Diagnosis not present

## 2021-07-24 DIAGNOSIS — S21109A Unspecified open wound of unspecified front wall of thorax without penetration into thoracic cavity, initial encounter: Secondary | ICD-10-CM

## 2021-07-24 DIAGNOSIS — Z170 Estrogen receptor positive status [ER+]: Secondary | ICD-10-CM

## 2021-07-24 DIAGNOSIS — Z79899 Other long term (current) drug therapy: Secondary | ICD-10-CM | POA: Diagnosis not present

## 2021-07-24 DIAGNOSIS — Z5111 Encounter for antineoplastic chemotherapy: Secondary | ICD-10-CM | POA: Diagnosis present

## 2021-07-24 DIAGNOSIS — C7951 Secondary malignant neoplasm of bone: Secondary | ICD-10-CM | POA: Diagnosis not present

## 2021-07-24 DIAGNOSIS — C50919 Malignant neoplasm of unspecified site of unspecified female breast: Secondary | ICD-10-CM | POA: Diagnosis not present

## 2021-07-24 DIAGNOSIS — C773 Secondary and unspecified malignant neoplasm of axilla and upper limb lymph nodes: Secondary | ICD-10-CM | POA: Diagnosis not present

## 2021-07-24 DIAGNOSIS — C7949 Secondary malignant neoplasm of other parts of nervous system: Secondary | ICD-10-CM | POA: Diagnosis not present

## 2021-07-24 MED ORDER — ONDANSETRON HCL 8 MG PO TABS: 8.0000 mg | ORAL_TABLET | Freq: Two times a day (BID) | ORAL | 1 refills | Status: DC | PRN

## 2021-07-24 MED ORDER — PROCHLORPERAZINE MALEATE 10 MG PO TABS: 10.0000 mg | ORAL_TABLET | Freq: Four times a day (QID) | ORAL | 1 refills | Status: DC | PRN

## 2021-07-24 NOTE — Progress Notes (Addendum)
Misty Arnold ([id number]) Visit Report for 07/24/2021 Chief Complaint Document Details Patient Name: Date of Service: Misty Arnold, Misty Arnold 07/24/2021 2:00 PM Medical Record Number: [id number] Patient Account Number: [id number] Date of Birth/Sex: Treating RN: 1971/05/22 (50 y.o. Sue Lush Primary Care Provider: Nicholas Lose Other Clinician: Referring Provider: Treating Provider/Extender: Vanessa Barbara Weeks in Treatment: 3 Information Obtained from: Patient Chief Complaint Metastatic breast cancer with open wound to previous mastectomy site Electronic Signature(s) Signed: 07/24/2021 3:26:23 PM By: Kalman Shan DO Entered By: Kalman Shan on 07/24/2021 15:10:39 -------------------------------------------------------------------------------- HPI Details Patient Name: Date of Service: Misty Arnold, Misty Arnold 07/24/2021 2:00 PM Medical Record Number: [id number] Patient Account Number: [id number] Date of Birth/Sex: Treating RN: 1971-06-17 (50 y.o. Sue Lush Primary Care Provider: Nicholas Lose Other Clinician: Referring Provider: Treating Provider/Extender: Vanessa Barbara Weeks in Treatment: 3 History of Present Illness HPI Description: Admission 8/29 Misty Arnold is a 50 year old female with a past medical history of metastatic breast cancer (ER/PR positive and HER-2 neg, BRCA neg) status post right mastectomy diagnosed in 2011. She currently takes [place] and zoladex. She states she has only taken oral chemotherapy for her treatment. She would like to follow in our clinic for supplies to help with drainage coming from her right mastectomy surgical site. This has been an ongoing issue for 2 years. 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 She is also hopeful that this will eventually close and heal. She currently denies systemic signs of infection. She has chronic pain for which she takes oxycodone  as needed but rarely does this. For her wound care she is using Xeroform and calcium alginate. She reports having to change the dressing twice daily with a total of 6 ABD pads due to drainage. 9/19; patient presents for 1 month follow-up. She has had home health up until recently. She would like to obtain supplies from our wound care supplier. She has been using silver alginate with dressing changes and requires multiple ABD pads throughout the day. She reports stability in her wound/breast cancer. She currently denies signs of infection. Electronic Signature(s) Signed: 07/24/2021 3:26:23 PM By: Kalman Shan DO Entered By: Kalman Shan on 07/24/2021 15:11:53 -------------------------------------------------------------------------------- Physical Exam Details Patient Name: Date of Service: Misty Arnold 07/24/2021 2:00 PM Medical Record Number: [id number] Patient Account Number: [id number] Date of Birth/Sex: Treating RN: [birth year] (50 y.o. Sue Lush Primary Care Provider: Nicholas Lose Other Clinician: Referring Provider: Treating Provider/Extender: Vanessa Barbara Weeks in Treatment: 3 Constitutional respirations regular, non-labored and within target range for patient.Marland Kitchen Psychiatric pleasant and cooperative. Notes Multiple large masses noted at right mastectomy site. Serous drainage noted. Electronic Signature(s) Signed: 07/24/2021 3:26:23 PM By: Kalman Shan DO Entered By: Kalman Shan on 07/24/2021 15:12:37 -------------------------------------------------------------------------------- Physician Orders Details Patient Name: Date of Service: Misty Arnold, [place] 07/24/2021 2:00 PM Medical Record Number: [id number] Patient Account Number: [id number] Date of Birth/Sex: Treating RN: May 25, 1971 (50 y.o. 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 - with Dr. Heber [place] Bathing/ Shower/ Hygiene May shower and wash wound with  soap and water. - when changing dressing Additional Orders / Instructions Follow Nutritious Diet - Recommend 100-120g of protein daily Wound Treatment Wound #1 - Breast (mastectomy site) Wound Laterality: Right Cleanser: Soap and Water 2 x Per Day/30 Days Discharge Instructions: May shower and wash wound with dial antibacterial soap and water prior to dressing change. Cleanser: Wound Cleanser 2 x Per Day/30 Days Discharge Instructions: Cleanse the wound with wound cleanser prior to applying a clean dressing using gauze sponges, not tissue or cotton balls. Prim Dressing: KerraCel Ag Gelling Fiber Dressing, 4x5 in (silver alginate) (DME) (Generic) 2 x Per Day/30 Days ary Discharge Instructions: Apply silver alginate to wound bed and pack lightly into hole at center of ulcer (requires 2 pieces per dressing change) Secondary Dressing: ABD Pad, 8x10 (DME) (Generic) 2 x Per Day/30 Days Discharge Instructions: Apply over primary dressing (2 per dressing change) Secondary Dressing: Zetuvit Plus 4x8 in 2 x Per Day/30 Days Discharge Instructions: Or equivalent super absorbant dressing Secondary Dressing: Xtrasorb Classic Super Absorbent Dressing, 6x9 (in/in) (DME) (Generic) 2 x Per Day/30 Days Discharge Instructions: Apply over primary dressing as directed. Secured With: mesh netting 2 x Per Day/30 Days Add-Ons: Abdominal/Chest Binder 2 x Per Day/30 Days Electronic Signature(s) Signed: 07/24/2021  3:26:23 PM By: Kalman Shan DO Entered By: Kalman Shan on 07/24/2021 15:12:56 -------------------------------------------------------------------------------- Problem List Details Patient Name: Date of Service: Misty Arnold, [place] 07/24/2021 2:00 PM Medical Record Number: [id number] Patient Account Number: [id number] Date of Birth/Sex: Treating RN: 06-07-1971 (50 y.o. 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 Sue Lush Primary Care Provider: Nicholas Lose Other Clinician: Referring Provider: Treating Provider/Extender: Vanessa Barbara Weeks in Treatment: 3 Subjective Chief Complaint Information obtained from Patient Metastatic breast cancer with open wound to previous mastectomy site History of Present Illness (HPI) Admission 8/29 Ms. Misty Arnold is a 50 year old female with a past medical history of metastatic breast cancer (ER/PR positive and HER-2 neg, BRCA neg)  status post right mastectomy diagnosed in 2011. She currently takes [place] and zoladex. She states she has only taken oral chemotherapy for her treatment. She would like to follow in our clinic for supplies to help with drainage coming from her right mastectomy surgical site. This has been an ongoing issue for 2 years. 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 She is also hopeful that this will eventually close and heal. She currently denies systemic signs of infection. She has chronic pain for which she takes oxycodone as needed but rarely does this. For her wound care she is using Xeroform and calcium alginate. She reports having to change the dressing twice daily with a total of 6 ABD pads due to drainage. 9/19; patient presents for 1 month follow-up. She has  had home health up until recently. She would like to obtain supplies from our wound care supplier. She has been using silver alginate with dressing changes and requires multiple ABD pads throughout the day. She reports stability in her wound/breast cancer. She currently denies signs of infection. Patient History Information obtained from Patient. Family History Cancer - Maternal Grandparents,Paternal Grandparents, Diabetes - Mother,Father,Maternal Grandparents,Paternal Grandparents, Heart Disease - Mother,Paternal Grandparents, Hypertension - Mother,Father,Paternal Grandparents, Stroke - Mother,Paternal Grandparents, Thyroid Problems - Paternal Grandparents, No family history of Hereditary Spherocytosis, Kidney Disease, Lung Disease, Seizures, Tuberculosis. Social History Never smoker, Marital Status - Divorced, Alcohol Use - Never, Drug Use - No History, Caffeine Use - Rarely. Medical History Hematologic/Lymphatic Patient has history of Anemia Cardiovascular Patient has history of Hypertension Endocrine Denies history of Type I Diabetes, Type II Diabetes Integumentary (Skin) Denies history of  History of Burn Musculoskeletal Patient has history of Osteoarthritis Neurologic Denies history of Dementia, Neuropathy, Quadriplegia, Paraplegia, Seizure Disorder Oncologic Patient has history of Received Chemotherapy, Received Radiation - 2021 Psychiatric Denies history of Anorexia/bulimia, Confinement Anxiety Hospitalization/Surgery History - right mastectomy. - T9 laminectomy and tumor resection. - hysterectomy. Medical A Surgical History Notes nd Oncologic metastatic breast CA Objective Constitutional respirations regular, non-labored and within target range for patient.. Vitals Time Taken: 2:30 PM, Height: 65 in, Weight: 125 lbs, BMI: 20.8, Temperature: 98.6 F, Pulse: 99 bpm, Respiratory Rate: 18 breaths/min, Blood Pressure: 121/79 mmHg. Psychiatric pleasant and cooperative. General Notes: Multiple large masses noted at right mastectomy site. Serous drainage noted. Integumentary (Hair, Skin) Wound #1 status is Open. Original cause of wound was Gradually Appeared. The date acquired was: 11/05/2018. The wound has been in treatment 3 weeks. The wound is located on the Right Breast (mastectomy site). The wound measures 14cm length x 12cm width x 2.5cm depth; 131.947cm^2 area and 329.867cm^3 volume. There is Fat Layer (Subcutaneous Tissue) exposed. There is no tunneling or undermining noted. There is a large amount of serous drainage noted. The wound margin is distinct with the outline attached to the wound base. There is medium (34-66%) red, pink, hyper - granulation within the wound bed. There is a medium (34-66%) amount of necrotic tissue within the wound bed including Adherent Slough. 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 She follows in our clinic for wound care dressings. Since she no longer has home health we will order supplies for her. I recommended continuing silver alginate but will add Xtrasorb to help with drainage. Lesions look stable. Follow-up in 1 month. Plan Follow-up Appointments: Return appointment in 1 month. - with Dr. Marigene Ehlers Shower/ Hygiene: May shower and wash wound with soap and water. - when changing dressing Additional Orders / Instructions: Follow Nutritious Diet - Recommend 100-120g of protein daily WOUND #1: - Breast (mastectomy site) Wound Laterality: Right Cleanser: Soap and Water 2 x Per Day/30 Days Discharge Instructions: May shower and wash wound with dial antibacterial soap and water prior to dressing change. Cleanser: Wound Cleanser 2 x Per Day/30 Days Discharge Instructions: Cleanse the wound with wound cleanser prior to applying a clean dressing using gauze sponges, not tissue or cotton balls. Prim Dressing: KerraCel Ag Gelling Fiber Dressing, 4x5 in (silver alginate) (DME) (Generic) 2 x Per Day/30 Days ary Discharge Instructions:  Apply silver alginate to wound bed and pack lightly into hole at center of ulcer (requires 2 pieces per dressing change) Secondary Dressing: ABD Pad, 8x10 (DME) (Generic) 2 x Per Day/30 Days Discharge Instructions: Apply over primary dressing (2 per dressing change) Secondary Dressing: Zetuvit Plus 4x8 in 2 x Per Day/30 Days Discharge Instructions: Or equivalent super absorbant dressing Secondary Dressing: Xtrasorb Classic Super Absorbent Dressing, 6x9 (in/in) (DME) (Generic) 2 x Per Day/30 Days Discharge Instructions: Apply over primary dressing as directed. Secured With: mesh netting 2 x Per Day/30 Days Add-Ons: Abdominal/Chest Binder 2 x Per Day/30 Days 1. Silver alginate and Xtrasorb 2. Follow-up in 1 month 3. Patient knows to call with any questions or concerns  and can be seen sooner Electronic Signature(s) Signed: 07/24/2021 3:26:23 PM By: Kalman Shan DO Entered By: Kalman Shan on 07/24/2021 15:19:23 -------------------------------------------------------------------------------- HxROS Details Patient Name: Date of Service: Misty Arnold, [place] 07/24/2021 2:00 PM Medical Record Number: [id number] Patient Account Number: [id number] Date of Birth/Sex: Treating RN: 1970/12/18 (49 y.o. Sue Lush Primary Care Provider: Nicholas Lose Other Clinician: Referring Provider: Treating Provider/Extender: Vanessa Barbara Weeks in Treatment: 3 Information Obtained From Patient Hematologic/Lymphatic Medical History: Positive for: Anemia Cardiovascular Medical History: Positive for: Hypertension Endocrine Medical History: Negative for: Type I Diabetes; Type II Diabetes Integumentary (Skin) Medical History: Negative for: History of Burn Musculoskeletal Medical History: Positive for: Osteoarthritis Neurologic Medical History: Negative for: Dementia; Neuropathy; Quadriplegia; Paraplegia; Seizure Disorder Oncologic Medical History: Positive for: Received Chemotherapy; Received Radiation - 2021 Past Medical History Notes: metastatic breast CA Psychiatric Medical History: Negative for: Anorexia/bulimia; Confinement Anxiety Immunizations Pneumococcal Vaccine: Received Pneumococcal Vaccination: No Implantable Devices None Hospitalization / Surgery History Type of Hospitalization/Surgery right mastectomy T9 laminectomy and tumor resection hysterectomy Family and Social History Cancer: Yes - Maternal Grandparents,Paternal Grandparents; Diabetes: Yes - Mother,Father,Maternal Grandparents,Paternal Grandparents; Heart Disease: Yes - Mother,Paternal Grandparents; Hereditary Spherocytosis: No; Hypertension: Yes - Mother,Father,Paternal Grandparents; Kidney Disease: No; Lung Disease: No; Seizures: No; Stroke: Yes -  Mother,Paternal Grandparents; Thyroid Problems: Yes - Paternal Grandparents; Tuberculosis: No; Never smoker; Marital Status - Divorced; Alcohol Use: Never; Drug Use: No History; Caffeine Use: Rarely; Financial Concerns: No; Food, Clothing or Shelter Needs: No; Support System Lacking: No; Transportation Concerns: Yes Electronic Signature(s) Signed: 07/24/2021 3:26:23 PM By: Kalman Shan DO Signed: 07/24/2021 5:01:42 PM By: Lorrin Jackson Entered By: Kalman Shan on 07/24/2021 15:12:02 -------------------------------------------------------------------------------- SuperBill Details Patient Name: Date of Service: Roe Coom[place] 07/24/2021 Medical Record Number: [id number] Patient Account Number: [id number] Date of Birth/Sex: Treating RN: 02/26/1971 (50 y.o. 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 Facility Procedures CPT4 Code: [id number] Description: 99213 - WOUND CARE VISIT-LEV 3 EST PT Modifier: Quantity: 1 Physician Procedures : CPT4 Code Description Modifier [id number] - WC PHYS LEVEL 3 - EST PT ICD-10 Diagnosis Description S21.109A Unspecified open wound of unspecified front wall of thorax without penetration into thoracic cavity, ini encounter C50.919 Malignant neoplasm  of unspecified site of unspecified female breast 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

## 2021-07-24 NOTE — Assessment & Plan Note (Signed)
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  Treatment summary: 1.Right mastectomy 08/29/2015 at [facility] (Dr.Singh): IDC with invol of skin and skin ulceration, breast capsule inv cancer, superior medial margin positive, 1/1 LN positive, grade 3, 14.3 cm, 3.5 cm, ALI, chest wall involv, ER 90-100%, PR 80-90%, HER-2 negative, Ki 67 40-50% T4CN1 (St 3B) 2.Patient started tamoxifen but developed severe headache with severe hypertension and tamoxifen was discontinued 03/17/2016. 3.02/2017 started on Exemestane, Zoladex started 05/29/2017 4.Hospitalization for paraplegia: T9 vertebral body compression status post decompressive laminectomy and tumor resection 01/04/2020 5.Palliative radiation to the thoracic spine 01/28/2020 6.Ibrance with letrozole started April 2021 ----------------------------------------------------------------------------------------------------------------- 05/11/2021: CT angiogram: Metastatic breast cancer soft tissue breast masses greater on the right, axillary supraclavicular lymphadenopathy, multiple bone metastases  Fatigue:Start B-12 injections monthly, Faslodex monthly  Right axillary lymph node biopsy 06/15/2021: Metastatic mammary carcinoma,  Guardant 360 05/30/2021: No reportable alterations Caris molecular testing: ER/PR positive, HER2 low, TMB low,: Recommended switching treatment to Enhertu.  She will read about it and will inform us of her decision.  continue with monthly B12 and do Zoladex injections.

## 2021-07-24 NOTE — Progress Notes (Signed)
START OFF PATHWAY REGIMEN - Breast   [id number]:Fam-trastuzumab deruxtecan-nxki 5.4 mg/kg IV D1 q21 Days:   A cycle is every 21 days:     Fam-trastuzumab deruxtecan-nxki   **Always confirm dose/schedule in your pharmacy ordering system**  Patient Characteristics: Distant Metastases or Locoregional Recurrent Disease - Unresected or Locally Advanced Unresectable Disease Progressing after Neoadjuvant and Local Therapies, HER2 Low/Negative/Unknown, ER Positive, Chemotherapy, HER2 Negative/Unknown, Third Line and  Beyond, Prior or Contraindicated Anthracycline and Prior Eribulin Therapeutic Status: Distant Metastases HER2 Status: Negative (-) ER Status: Positive (+) PR Status: Positive (+) Therapy Approach Indicated: Standard Chemotherapy/Endocrine Therapy Line of Therapy: Third Line and Beyond Intent of Therapy: Non-Curative / Palliative Intent, Discussed with Patient

## 2021-07-25 ENCOUNTER — Telehealth: Payer: Self-pay | Admitting: Hematology and Oncology

## 2021-07-25 DIAGNOSIS — S21109A Unspecified open wound of unspecified front wall of thorax without penetration into thoracic cavity, initial encounter: Secondary | ICD-10-CM | POA: Diagnosis not present

## 2021-07-25 DIAGNOSIS — C7949 Secondary malignant neoplasm of other parts of nervous system: Secondary | ICD-10-CM | POA: Diagnosis not present

## 2021-07-25 NOTE — Progress Notes (Signed)
Misty Arnold, Misty Arnold ([id number]) Visit Report for 07/24/2021 Arrival Information Details Patient Name: Date of Service: Misty Arnold 07/24/2021 2:00 PM Medical Record Number: [id number] Patient Account Number: [id number] Date of Birth/Sex: Treating RN: 05/12/1971 (50 y.o. Sue Lush Primary Care Dominigue Gellner: Nicholas Lose Other Clinician: Referring Tabia Landowski: Treating Mackensie Pilson/Extender: Vanessa Barbara Weeks in Treatment: 3 Visit Information History Since Last Visit Added or deleted any medications: No Patient Arrived: Kasandra Knudsen Any new allergies or adverse reactions: No Arrival Time: 14:24 Had a fall or experienced change in No Accompanied By: self activities of daily living that may affect Transfer Assistance: None risk of falls: Patient Identification Verified: Yes Signs or symptoms of abuse/neglect since last visito No Secondary Verification Process Completed: Yes Hospitalized since last visit: No Patient Requires Transmission-Based Precautions: No Implantable device outside of the clinic excluding No Patient Has Alerts: No cellular tissue based products placed in the center since last visit: Has Dressing in Place as Prescribed: Yes Pain Present Now: Yes Electronic Signature(s) Signed: 07/25/2021 4:02:45 PM By: Sandre Kitty Entered By: Sandre Kitty on 07/24/2021 14:28:25 -------------------------------------------------------------------------------- Clinic Level of Care Assessment Details Patient Name: Date of Service: Misty Arnold 07/24/2021 2:00 PM Medical Record Number: [id number] Patient Account Number: [id number] Date of Birth/Sex: Treating RN: 1971-03-28 (50 y.o. Elam Dutch Primary Care Medford Staheli: Nicholas Lose Other Clinician: Referring Aleksa Collinsworth: Treating Abyan Cadman/Extender: Vanessa Barbara Weeks in Treatment: 3 Clinic Level of Care Assessment Items TOOL 4 Quantity Score []  - 0 Use when only an EandM is performed  on FOLLOW-UP visit ASSESSMENTS - Nursing Assessment / Reassessment X- 1 10 Reassessment of Co-morbidities (includes updates in patient status) X- 1 5 Reassessment of Adherence to Treatment Plan ASSESSMENTS - Wound and Skin A ssessment / Reassessment X - Simple Wound Assessment / Reassessment - one wound 1 5 []  - 0 Complex Wound Assessment / Reassessment - multiple wounds []  - 0 Dermatologic / Skin Assessment (not related to wound area) ASSESSMENTS - Focused Assessment []  - 0 Circumferential Edema Measurements - multi extremities []  - 0 Nutritional Assessment / Counseling / Intervention []  - 0 Lower Extremity Assessment (monofilament, tuning fork, pulses) []  - 0 Peripheral Arterial Disease Assessment (using hand held doppler) ASSESSMENTS - Ostomy and/or Continence Assessment and Care []  - 0 Incontinence Assessment and Management []  - 0 Ostomy Care Assessment and Management (repouching, etc.) PROCESS - Coordination of Care X - Simple Patient / Family Education for ongoing care 1 15 []  - 0 Complex (extensive) Patient / Family Education for ongoing care X- 1 10 Staff obtains Programmer, systems, Records, T Results / Process Orders est []  - 0 Staff telephones HHA, Nursing Homes / Clarify orders / etc []  - 0 Routine Transfer to another Facility (non-emergent condition) []  - 0 Routine Hospital Admission (non-emergent condition) []  - 0 New Admissions / Biomedical engineer / Ordering NPWT Apligraf, etc. , []  - 0 Emergency Hospital Admission (emergent condition) X- 1 10 Simple Discharge Coordination []  - 0 Complex (extensive) Discharge Coordination PROCESS - Special Needs []  - 0 Pediatric / Minor Patient Management []  - 0 Isolation Patient Management []  - 0 Hearing / Language / Visual special needs []  - 0 Assessment of Community assistance (transportation, D/C planning, etc.) []  - 0 Additional assistance / Altered mentation []  - 0 Support Surface(s) Assessment (bed,  cushion, seat, etc.) INTERVENTIONS - Wound Cleansing / Measurement X - Simple Wound Cleansing - one wound 1 5 []  - 0 Complex Wound Cleansing - multiple wounds X- 1 5 Wound Imaging (  photographs - any number of wounds) []  - 0 Wound Tracing (instead of photographs) X- 1 5 Simple Wound Measurement - one wound []  - 0 Complex Wound Measurement - multiple wounds INTERVENTIONS - Wound Dressings []  - 0 Small Wound Dressing one or multiple wounds X- 1 15 Medium Wound Dressing one or multiple wounds []  - 0 Large Wound Dressing one or multiple wounds []  - 0 Application of Medications - topical []  - 0 Application of Medications - injection INTERVENTIONS - Miscellaneous []  - 0 External ear exam []  - 0 Specimen Collection (cultures, biopsies, blood, body fluids, etc.) []  - 0 Specimen(s) / Culture(s) sent or taken to Lab for analysis []  - 0 Patient Transfer (multiple staff / Civil Service fast streamer / Similar devices) []  - 0 Simple Staple / Suture removal (25 or less) []  - 0 Complex Staple / Suture removal (26 or more) []  - 0 Hypo / Hyperglycemic Management (close monitor of Blood Glucose) []  - 0 Ankle / Brachial Index (ABI) - do not check if billed separately X- 1 5 Vital Signs Has the patient been seen at the hospital within the last three years: Yes Total Score: 90 Level Of Care: New/Established - Level 3 Electronic Signature(s) Signed: 07/24/2021 4:58:58 PM By: Baruch Gouty RN, BSN Entered By: Baruch Gouty on 07/24/2021 15:20:36 -------------------------------------------------------------------------------- Multi Wound Chart Details Patient Name: Date of Service: Misty Arnold, [place] 07/24/2021 2:00 PM Medical Record Number: [id number] Patient Account Number: [id number] Date of Birth/Sex: Treating RN: May 26, 1971 (50 y.o. Sue Lush Primary Care Kurt Azimi: Nicholas Lose Other Clinician: Referring Susannah Carbin: Treating Doye Montilla/Extender: Vanessa Barbara Weeks in  Treatment: 3 Vital Signs Height(in): 65 Pulse(bpm): 99 Weight(lbs): 125 Blood Pressure(mmHg): 121/79 Body Mass Index(BMI): 21 Temperature(F): 98.6 Respiratory Rate(breaths/min): 18 Photos: [N/A:N/A] Right Breast (mastectomy site) N/A N/A Wound Location: Gradually Appeared N/A N/A Wounding Event: Malignant Wound N/A N/A Primary Etiology: Anemia, Hypertension, Osteoarthritis, N/A N/A Comorbid History: Received Chemotherapy, Received Radiation 11/05/2018 N/A N/A Date Acquired: 3 N/A N/A Weeks of Treatment: Open N/A N/A Wound Status: 14x12x2.5 N/A N/A Measurements L x W x D (cm) 131.947 N/A N/A A (cm) : rea 329.867 N/A N/A Volume (cm) : -0.70% N/A N/A % Reduction in Area: -48.20% N/A N/A % Reduction in Volume: Full Thickness Without Exposed N/A N/A Classification: Support Structures Large N/A N/A Exudate Amount: Serous N/A N/A Exudate Type: amber N/A N/A Exudate Color: Distinct, outline attached N/A N/A Wound Margin: Medium (34-66%) N/A N/A Granulation Amount: Red, Pink, Hyper-granulation N/A N/A Granulation Quality: Medium (34-66%) N/A N/A Necrotic Amount: Fat Layer (Subcutaneous Tissue): Yes N/A N/A Exposed Structures: Fascia: No Tendon: No Muscle: No Joint: No Bone: No None N/A N/A Epithelialization: Treatment Notes Electronic Signature(s) Signed: 07/24/2021 3:26:23 PM By: Kalman Shan DO Signed: 07/24/2021 5:01:42 PM By: Lorrin Jackson Entered By: Kalman Shan on 07/24/2021 15:10:31 -------------------------------------------------------------------------------- Multi-Disciplinary Care Plan Details Patient Name: Date of Service: Misty Arnold, [place] 07/24/2021 2:00 PM Medical Record Number: [id number] Patient Account Number: [id number] Date of Birth/Sex: Treating RN: [birth year] (50 y.o. Elam Dutch Primary Care Tanor Glaspy: Nicholas Lose Other Clinician: Referring Tanaia Hawkey: Treating Ulric Salzman/Extender: Vanessa Barbara Weeks in Treatment: 3 Active Inactive Malignancy/Atypical Etiology Nursing Diagnoses: Knowledge deficit related to disease process and management of malignancy Goals: Patient/caregiver will verbalize understanding of disease process and disease management of malignancy Date Initiated: 07/24/2021 Target Resolution Date: 08/21/2021 Goal Status: Active Interventions: Assess patient and family medical history for signs and symptoms of malignancy/atypical etiology upon admission Provide education on malignant ulcerations Notes:  Wound/Skin Impairment Nursing Diagnoses: Impaired tissue integrity Goals: Patient/caregiver will verbalize understanding of skin care regimen Date Initiated: 07/03/2021 Target Resolution Date: 07/31/2021 Goal Status: Active Ulcer/skin breakdown will have a volume reduction of 30% by week 4 Date Initiated: 07/03/2021 Target Resolution Date: 07/31/2021 Goal Status: Active Interventions: Assess patient/caregiver ability to obtain necessary supplies Assess patient/caregiver ability to perform ulcer/skin care regimen upon admission and as needed Assess ulceration(s) every visit Provide education on ulcer and skin care Treatment Activities: Topical wound management initiated : 07/03/2021 Notes: Electronic Signature(s) Signed: 07/24/2021 4:58:58 PM By: Baruch Gouty RN, BSN Entered By: Baruch Gouty on 07/24/2021 14:52:24 -------------------------------------------------------------------------------- Pain Assessment Details Patient Name: Date of Service: Misty Arnold, [place] 07/24/2021 2:00 PM Medical Record Number: [id number] Patient Account Number: [id number] Date of Birth/Sex: Treating RN: Apr 27, 1971 (50 y.o. Sue Lush Primary Care Brendan Gruwell: Nicholas Lose Other Clinician: Referring Nyaisha Simao: Treating Bohden Dung/Extender: Vanessa Barbara Weeks in Treatment: 3 Active Problems Location of Pain Severity and Description of  Pain Patient Has Paino No Site Locations Rate the pain. Current Pain Level: 0 Pain Management and Medication Current Pain Management: Electronic Signature(s) Signed: 07/24/2021 4:58:58 PM By: Baruch Gouty RN, BSN Signed: 07/24/2021 5:01:42 PM By: Lorrin Jackson Entered By: Baruch Gouty on 07/24/2021 14:45:36 -------------------------------------------------------------------------------- Patient/Caregiver Education Details Patient Name: Date of Service: Roe Coombs 9/19/2022andnbsp2:00 PM Medical Record Number: [id number] Patient Account Number: [id number] Date of Birth/Gender: Treating RN: [birth year] (50 y.o. Elam Dutch Primary Care Physician: Nicholas Lose Other Clinician: Referring Physician: Treating Physician/Extender: Carolan Shiver in Treatment: 3 Education Assessment Education Provided To: Patient Education Topics Provided Malignant/Atypical Wounds: Methods: Explain/Verbal Responses: Reinforcements needed, State content correctly Wound/Skin Impairment: Methods: Explain/Verbal Responses: Reinforcements needed, State content correctly Electronic Signature(s) Signed: 07/24/2021 4:58:58 PM By: Baruch Gouty RN, BSN Entered By: Baruch Gouty on 07/24/2021 14:52:47 -------------------------------------------------------------------------------- Wound Assessment Details Patient Name: Date of Service: Misty Arnold, [place] 07/24/2021 2:00 PM Medical Record Number: [id number] Patient Account Number: [id number] Date of Birth/Sex: Treating RN: [birth year] (50 y.o. Elam Dutch Primary Care Paullette Mckain: Nicholas Lose Other Clinician: Referring Markell Schrier: Treating Lendell Gallick/Extender: Vanessa Barbara Weeks in Treatment: 3 Wound Status Wound Number: 1 Primary Malignant Wound Etiology: Wound Location: Right Breast (mastectomy site) Wound Open Wounding Event: Gradually Appeared Status: Date Acquired:  11/05/2018 Comorbid Anemia, Hypertension, Osteoarthritis, Received Chemotherapy, Weeks Of Treatment: 3 History: Received Radiation Clustered Wound: No Photos Wound Measurements Length: (cm) 14 Width: (cm) 12 Depth: (cm) 2.5 Area: (cm) 131.947 Volume: (cm) 329.867 % Reduction in Area: -0.7% % Reduction in Volume: -48.2% Epithelialization: None Tunneling: No Undermining: No Wound Description Classification: Full Thickness Without Exposed Support Structures Wound Margin: Distinct, outline attached Exudate Amount: Large Exudate Type: Serous Exudate Color: amber Foul Odor After Cleansing: No Slough/Fibrino Yes Wound Bed Granulation Amount: Medium (34-66%) Exposed Structure Granulation Quality: Red, Pink, Hyper-granulation Fascia Exposed: No Necrotic Amount: Medium (34-66%) Fat Layer (Subcutaneous Tissue) Exposed: Yes Necrotic Quality: Adherent Slough Tendon Exposed: No Muscle Exposed: No Joint Exposed: No Bone Exposed: No Electronic Signature(s) Signed: 07/24/2021 4:58:58 PM By: Baruch Gouty RN, BSN Entered By: Baruch Gouty on 07/24/2021 14:50:37 -------------------------------------------------------------------------------- Vitals Details Patient Name: Date of Service: Misty Arnold, [place] 07/24/2021 2:00 PM Medical Record Number: [id number] Patient Account Number: [id number] Date of Birth/Sex: Treating RN: 03-10-1971 (50 y.o. Sue Lush Primary Care Eulla Kochanowski: Nicholas Lose Other Clinician: Referring Romanita Fager: Treating Taylia Berber/Extender: Vanessa Barbara Weeks in Treatment: 3 Vital Signs Time Taken: 14:30 Temperature (F): 98.6 Height (in): 65 Pulse (bpm): 99  Weight (lbs): 125 Respiratory Rate (breaths/min): 18 Body Mass Index (BMI): 20.8 Blood Pressure (mmHg): 121/79 Reference Range: 80 - 120 mg / dl Electronic Signature(s) Signed: 07/25/2021 4:02:45 PM By: Sandre Kitty Entered By: Sandre Kitty on 07/24/2021 14:32:33

## 2021-07-25 NOTE — Telephone Encounter (Signed)
Scheduled appointment per 09/19 los. Left messgae.

## 2021-07-26 ENCOUNTER — Other Ambulatory Visit: Payer: Self-pay | Admitting: Hematology and Oncology

## 2021-07-26 NOTE — Progress Notes (Signed)
We are discontinuing Zoladex and Faslodex. Patient will be on single agent Enhertu.

## 2021-07-28 MED FILL — Dexamethasone Sodium Phosphate Inj 100 MG/10ML: INTRAMUSCULAR | Qty: 1 | Status: AC

## 2021-07-31 ENCOUNTER — Inpatient Hospital Stay: Payer: Medicare PPO

## 2021-07-31 ENCOUNTER — Inpatient Hospital Stay: Payer: Medicare PPO | Admitting: Hematology and Oncology

## 2021-07-31 ENCOUNTER — Other Ambulatory Visit: Payer: Medicare PPO

## 2021-07-31 ENCOUNTER — Ambulatory Visit: Payer: Medicare PPO | Admitting: Hematology and Oncology

## 2021-07-31 NOTE — Assessment & Plan Note (Deleted)
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  Treatment summary: 1.Right mastectomy 08/29/2015 at [facility] (Dr.Singh): IDC with invol of skin and skin ulceration, breast capsule inv cancer, superior medial margin positive, 1/1 LN positive, grade 3, 14.3 cm, 3.5 cm, ALI, chest wall involv, ER 90-100%, PR 80-90%, HER-2 negative, Ki 67 40-50% T4CN1 (St 3B) 2.Patient started tamoxifen but developed severe headache with severe hypertension and tamoxifen was discontinued 03/17/2016. 3.02/2017 started on Exemestane, Zoladex started 05/29/2017 4.Hospitalization for paraplegia: T9 vertebral body compression status post decompressive laminectomy and tumor resection 01/04/2020 5.Palliative radiation to the thoracic spine 01/28/2020 6.Ibrance with letrozole started April 2021 discontinued 07/24/2021 7.  Enhertu started 07/31/2021 every 3 weeks  ----------------------------------------------------------------------------------------------------------------- 05/11/2021: CT angiogram: Metastatic breast cancer soft tissue breast masses greater on the right, axillary supraclavicular lymphadenopathy, multiple bone metastases  Treatment plan:Enhertu  Right axillary lymph node biopsy 06/15/2021: Metastatic mammary carcinoma,  Guardant 360 05/30/2021: No reportable alterations [facility] molecular testing: ER/PR positive, HER2 low, TMB low.  Return to clinic in 3 weeks for next cycle of treatment.

## 2021-08-01 ENCOUNTER — Other Ambulatory Visit: Payer: Self-pay | Admitting: *Deleted

## 2021-08-01 ENCOUNTER — Other Ambulatory Visit: Payer: Self-pay

## 2021-08-01 ENCOUNTER — Other Ambulatory Visit: Payer: Self-pay | Admitting: Hematology and Oncology

## 2021-08-01 ENCOUNTER — Other Ambulatory Visit (HOSPITAL_COMMUNITY): Payer: Self-pay

## 2021-08-01 ENCOUNTER — Inpatient Hospital Stay: Payer: Medicare PPO

## 2021-08-01 ENCOUNTER — Inpatient Hospital Stay (HOSPITAL_BASED_OUTPATIENT_CLINIC_OR_DEPARTMENT_OTHER): Payer: Medicare PPO | Admitting: Hematology and Oncology

## 2021-08-01 VITALS — BP 140/90 | HR 104 | Temp 98.2°F | Resp 18

## 2021-08-01 DIAGNOSIS — Z170 Estrogen receptor positive status [ER+]: Secondary | ICD-10-CM

## 2021-08-01 DIAGNOSIS — Z79899 Other long term (current) drug therapy: Secondary | ICD-10-CM | POA: Diagnosis not present

## 2021-08-01 DIAGNOSIS — C773 Secondary and unspecified malignant neoplasm of axilla and upper limb lymph nodes: Secondary | ICD-10-CM | POA: Diagnosis not present

## 2021-08-01 DIAGNOSIS — C50919 Malignant neoplasm of unspecified site of unspecified female breast: Secondary | ICD-10-CM

## 2021-08-01 DIAGNOSIS — C7951 Secondary malignant neoplasm of bone: Secondary | ICD-10-CM | POA: Diagnosis not present

## 2021-08-01 DIAGNOSIS — C50411 Malignant neoplasm of upper-outer quadrant of right female breast: Secondary | ICD-10-CM

## 2021-08-01 DIAGNOSIS — Z5111 Encounter for antineoplastic chemotherapy: Secondary | ICD-10-CM | POA: Diagnosis not present

## 2021-08-01 MED ORDER — CVS D3 125 MCG (5000 UT) PO CAPS: 5000.0000 [IU] | ORAL_CAPSULE | Freq: Two times a day (BID) | ORAL | 3 refills | Status: DC

## 2021-08-01 MED ORDER — OXYCODONE HCL 10 MG PO TABS
10.0000 mg | ORAL_TABLET | ORAL | 0 refills | Status: DC | PRN
  Filled 2021-08-01: qty 30, 4d supply, fill #0

## 2021-08-01 MED ORDER — GOSERELIN ACETATE 3.6 MG ~~LOC~~ IMPL
3.6000 mg | DRUG_IMPLANT | Freq: Once | SUBCUTANEOUS | Status: AC
  Administered 2021-08-01: 3.6 mg via SUBCUTANEOUS
  Filled 2021-08-01: qty 3.6

## 2021-08-01 MED ORDER — DIAZEPAM 5 MG PO TABS
5.0000 mg | ORAL_TABLET | Freq: Four times a day (QID) | ORAL | 3 refills | Status: DC | PRN
  Filled 2021-08-01: qty 30, 9d supply, fill #0

## 2021-08-01 NOTE — Patient Instructions (Signed)
Goserelin injection What is this medication? GOSERELIN (GOE se rel in) is similar to a hormone found in the body. It lowers the amount of sex hormones that the body makes. Men will have lower testosterone levels and women will have lower estrogen levels while taking this medicine. In men, this medicine is used to treat prostate cancer; the injection is either given once per month or once every 12 weeks. A once per month injection (only) is used to treat women with endometriosis, dysfunctional uterine bleeding, or advanced breast cancer. This medicine may be used for other purposes; ask your health care provider or pharmacist if you have questions. COMMON BRAND NAME(S): Zoladex What should I tell my care team before I take this medication? They need to know if you have any of these conditions: bone problems diabetes heart disease history of irregular heartbeat an unusual or allergic reaction to goserelin, other medicines, foods, dyes, or preservatives pregnant or trying to get pregnant breast-feeding How should I use this medication? This medicine is for injection under the skin. It is given by a health care professional in a hospital or clinic setting. Talk to your pediatrician regarding the use of this medicine in children. Special care may be needed. Overdosage: If you think you have taken too much of this medicine contact a poison control center or emergency room at once. NOTE: This medicine is only for you. Do not share this medicine with others. What if I miss a dose? It is important not to miss your dose. Call your doctor or health care professional if you are unable to keep an appointment. What may interact with this medication? Do not take this medicine with any of the following medications: cisapride dronedarone pimozide thioridazine This medicine may also interact with the following medications: other medicines that prolong the QT interval (an abnormal heart rhythm) This list  may not describe all possible interactions. Give your health care provider a list of all the medicines, herbs, non-prescription drugs, or dietary supplements you use. Also tell them if you smoke, drink alcohol, or use illegal drugs. Some items may interact with your medicine. What should I watch for while using this medication? Visit your doctor or health care provider for regular checks on your progress. Your symptoms may appear to get worse during the first weeks of this therapy. Tell your doctor or healthcare provider if your symptoms do not start to get better or if they get worse after this time. Your bones may get weaker if you take this medicine for a long time. If you smoke or frequently drink alcohol you may increase your risk of bone loss. A family history of osteoporosis, chronic use of drugs for seizures (convulsions), or corticosteroids can also increase your risk of bone loss. Talk to your doctor about how to keep your bones strong. This medicine should stop regular monthly menstruation in women. Tell your doctor if you continue to menstruate. Women should not become pregnant while taking this medicine or for 12 weeks after stopping this medicine. Women should inform their doctor if they wish to become pregnant or think they might be pregnant. There is a potential for serious side effects to an unborn child. Talk to your health care professional or pharmacist for more information. Do not breast-feed an infant while taking this medicine. Men should inform their doctors if they wish to father a child. This medicine may lower sperm counts. Talk to your health care professional or pharmacist for more information. This medicine may   increase blood sugar. Ask your healthcare provider if changes in diet or medicines are needed if you have diabetes. What side effects may I notice from receiving this medication? Side effects that you should report to your doctor or health care professional as soon as  possible: allergic reactions like skin rash, itching or hives, swelling of the face, lips, or tongue bone pain breathing problems changes in vision chest pain feeling faint or lightheaded, falls fever, chills pain, swelling, warmth in the leg pain, tingling, numbness in the hands or feet signs and symptoms of high blood sugar such as being more thirsty or hungry or having to urinate more than normal. You may also feel very tired or have blurry vision signs and symptoms of low blood pressure like dizziness; feeling faint or lightheaded, falls; unusually weak or tired stomach pain swelling of the ankles, feet, hands trouble passing urine or change in the amount of urine unusually high or low blood pressure unusually weak or tired Side effects that usually do not require medical attention (report to your doctor or health care professional if they continue or are bothersome): change in sex drive or performance changes in breast size in both males and females changes in emotions or moods headache hot flashes irritation at site where injected loss of appetite skin problems like acne, dry skin vaginal dryness This list may not describe all possible side effects. Call your doctor for medical advice about side effects. You may report side effects to FDA at 1-800-FDA-1088. Where should I keep my medication? This drug is given in a hospital or clinic and will not be stored at home. NOTE: This sheet is a summary. It may not cover all possible information. If you have questions about this medicine, talk to your doctor, pharmacist, or health care provider.  2022 [employer]vier/Gold Standard (2019-02-09 14:05:56)  

## 2021-08-01 NOTE — Progress Notes (Signed)
Patient Care Team: Nicholas Lose, MD as PCP - General (Hematology and Oncology)  DIAGNOSIS:  Encounter Diagnoses  Name Primary?   Metastatic breast cancer ([place]) Yes   Malignant neoplasm of upper-outer quadrant of right breast in female, estrogen receptor positive ([place])     SUMMARY OF ONCOLOGIC HISTORY: Oncology History  Breast cancer of upper-outer quadrant of right female breast ([place])  09/21/2010 Mammogram   right breast linear and segmental pleomorphic calcifications from 4:00 to 6:00 position ultrasound revealed 1.6 cm and 1.1 cm masses   09/27/2010 Initial Biopsy   ultrasound-guided biopsy of all masses showed DCIS grade 2; patient went to cancer treatment centers of [place] for second opinion and delayed therapy   01/26/2011 Surgery   right mastectomy followed by reconstruction: 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 But she could not afford these trips.   01/08/2014 Breast MRI   right breast multiple enhancing masses within the soft tissues largest 5.6 cm in wall skin surface and the capsule of the silicone prosthesis, contiguous nodules involving in inferomedial breast and tired and subcentimeter nodules across the midline   08/29/2015 Surgery   Right mastectomy: IDC with invol of skin and skin ulceration, breast capsule inv cancer, superior medial margin positive, 1/1 LN positive, grade 3, 14.3 cm, 3.5 cm, ALI, chest wall involv, ER 90-100%, PR 80-90%, HER-2 negative, Ki 67 40-50% T4CN1 (St 3B)   03/05/2016 -  Anti-estrogen oral therapy   Tamoxifen  20 mg daily stopped due to headache and uncontrolled hypertension. Decrease to 10 mg daily 04/03/2016, stopped June 2017 and took an estrogen metabolizer over-the-counter; started Aromasin April 2018 from a [place] physician, Zoladex started on 05/2017 and switched to Letrozole in 09/2017   01/04/2020 Surgery   Patient presented to the ED on 01/04/20 for worsening lower extremity numbness and underwent a decompressive laminectomy with tumor resection at T9 that was complicated with acute blood loss anemia and leukocytosis.   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  She tells me that she is changing some of her diets and using different essential oils and she wants to try to see if that makes a difference before she goes on to systemic chemotherapy options.   ALLERGIES:  is allergic to amoxicillin, penicillins, pork-derived products, penicillin g, and tamoxifen.  MEDICATIONS:  Current Outpatient Medications  Medication Sig Dispense Refill   acetaminophen (TYLENOL) 325 MG tablet Take 2 tablets (650 mg total) by mouth every 4 (four) hours as needed for mild pain ((score  1 to 3) or temp > 100.5). 30 tablet 0   amLODipine (NORVASC) 10 MG tablet Take 1 tablet (10 mg total) by mouth daily. 90 tablet 3   Ascorbic Acid (VITAMIN C PO) Take by mouth.     Bioflavonoid Products (ESTER C PO) Take 1 tablet by  mouth with breakfast, with lunch, and with evening meal.      bisacodyl (DULCOLAX) 10 MG suppository Place 1 suppository (10 mg total) rectally daily as needed for moderate constipation. 12 suppository 0   BLACK CURRANT SEED OIL PO Take by mouth.     Calcium Carb-Cholecalciferol (OYSTER SHELL CALCIUM W/D) 500-200 MG-UNIT TABS TAKE 1 TABLET BY MOUTH TWICE A DAY WITH MEALS 180 tablet 1   Cholecalciferol ([employer] D3) 125 MCG (5000 UT) capsule Take 1 capsule (5,000 Units total) by mouth 2 (two) times daily. 180 capsule 3   diazepam (VALIUM) 5 MG tablet Take 1-2 tablets (5-10 mg total) by mouth every 6 (six) hours as needed for muscle spasms. 30 tablet 3   Flaxseed Oil OIL Take 5 mLs by mouth in the morning, at noon, and at bedtime.      goserelin (ZOLADEX) 3.6 MG injection Inject 3.6 mg into the skin every 28 (twenty-eight) days.     letrozole (FEMARA) 2.5 MG tablet TAKE 1 TABLET BY MOUTH EVERY DAY 90 tablet 1   lidocaine-prilocaine (EMLA) cream APPLY TOPICALLY AS NEEDED. 30 g 1   Oxycodone HCl 10 MG TABS Take 1 tablet (10 mg total) by mouth every 3 (three) hours as needed. 30 tablet 0   palbociclib (IBRANCE) 125 MG tablet TAKE 1 TABLET (125 MG TOTAL) BY MOUTH DAILY. TAKE FOR 21 DAYS ON, 7 DAYS OFF, REPEAT EVERY 28 DAYS. 21 tablet 2   polyethylene glycol (MIRALAX / GLYCOLAX) 17 g packet Take 17 g by mouth daily as needed for mild constipation. 14 each 0   No current facility-administered medications for this visit.    PHYSICAL EXAMINATION: ECOG PERFORMANCE STATUS: 2 - Symptomatic, <50% confined to bed  There were no vitals filed for this visit. There were no vitals filed for this visit.   LABORATORY DATA:  I have reviewed the data as listed CMP Latest Ref Rng & Units 06/22/2021 05/26/2021 05/13/2021  Glucose 70 - 99 mg/dL 117(H) 141(H) 102(H)  BUN 6 - 20 mg/dL _0 Creatinine 0.44 - 1.00 mg/dL 1.19(H) 1.11(H) 1.02(H)  Sodium 135 - 145 mmol/L 141 143 137  Potassium 3.5 - 5.1 mmol/L 3.9 4.1 4.1   Chloride 98 - 111 mmol/L 103 106 103  CO2 22 - 32 mmol/L _1 Calcium 8.9 - 10.3 mg/dL 9.6 9.4 9.4  Total Protein 6.5 - 8.1 g/dL 7.4 7.3 -  Total Bilirubin 0.3 - 1.2 mg/dL 0.4 0.3 -  Alkaline Phos 38 - 126 U/L 99 87 -  AST 15 - 41 U/L 28 29 -  ALT 0 - 44 U/L 16 14 -    Lab Results  Component Value Date   WBC 3.1 (L) 06/22/2021   HGB 11.7 (L) 06/22/2021   HCT 34.1 (L) 06/22/2021   MCV 98.8 06/22/2021   PLT 358 06/22/2021   NEUTROABS 1.9 06/22/2021    ASSESSMENT & PLAN:  Breast cancer of upper-outer quadrant of right female breast ([place]) Recurrent right breast cancer initially DCIS treated with mastectomy followed by reconstruction and later developed chest wall recurrence in 2013 ER/PR positive HER-2 negative, patient underwent alternative therapies in [place] but could not afford  the trips and hence she has not been on any breast cancer therapy for a long time.   Treatment summary: 1. Right mastectomy 08/29/2015 at [facility] (Dr.Singh): IDC with invol of skin and skin ulceration, breast capsule inv cancer, superior medial margin positive, 1/1 LN positive, grade 3, 14.3 cm, 3.5 cm, ALI, chest wall involv, ER 90-100%, PR 80-90%, HER-2 negative, Ki 67 40-50% T4CN1 (St 3B) 2. Patient started tamoxifen but developed severe headache with severe hypertension and tamoxifen was discontinued 03/17/2016.  3. 02/2017 started on Exemestane, Zoladex started 05/29/2017 4.  Hospitalization for paraplegia: T9 vertebral body compression status post decompressive laminectomy and tumor resection 01/04/2020 5.  Palliative radiation to the thoracic spine 01/28/2020 6.  Ibrance with letrozole started April 2021 discontinued 07/24/2021 7.  Enhertu started 07/31/2021 every 3 weeks  ----------------------------------------------------------------------------------------------------------------- 05/11/2021: CT angiogram: Metastatic breast cancer soft tissue breast masses greater on the right, axillary supraclavicular  lymphadenopathy, multiple bone metastases   Treatment plan: Patient decided not to initiate Enhertu at this time. Therefore I discontinued Enhertu and initiated back on Zoladex with letrozole and Ibrance.   Right axillary lymph node biopsy 06/15/2021: Metastatic mammary carcinoma,   Guardant 360 05/30/2021: No reportable alterations Caris molecular testing: ER/PR positive, HER2 low, TMB low.  Return to clinic monthly for injection and follow-ups. If she decides to receive Enhertu, we will then place those orders and restart the plan.   No orders of the defined types were placed in this encounter.  The patient has a good understanding of the overall plan. she agrees with it. she will call with any problems that may develop before the next visit here. Total time spent: 30 mins including face to face time and time spent for planning, charting and co-ordination of care   Harriette Ohara, MD 08/01/21

## 2021-08-01 NOTE — Assessment & Plan Note (Signed)
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  Treatment summary: 1.Right mastectomy 08/29/2015 at [facility] (Dr.Singh): IDC with invol of skin and skin ulceration, breast capsule inv cancer, superior medial margin positive, 1/1 LN positive, grade 3, 14.3 cm, 3.5 cm, ALI, chest wall involv, ER 90-100%, PR 80-90%, HER-2 negative, Ki 67 40-50% T4CN1 (St 3B) 2.Patient started tamoxifen but developed severe headache with severe hypertension and tamoxifen was discontinued 03/17/2016. 3.02/2017 started on Exemestane, Zoladex started 05/29/2017 4.Hospitalization for paraplegia: T9 vertebral body compression status post decompressive laminectomy and tumor resection 01/04/2020 5.Palliative radiation to the thoracic spine 01/28/2020 6.Ibrance with letrozole started April 2021 discontinued 07/24/2021 7.  Enhertu started 07/31/2021 every 3 weeks  ----------------------------------------------------------------------------------------------------------------- 05/11/2021: CT angiogram: Metastatic breast cancer soft tissue breast masses greater on the right, axillary supraclavicular lymphadenopathy, multiple bone metastases  Treatment plan:Patient decided not to initiate Enhertu at this time. Therefore I discontinued Enhertu and initiated back on Zoladex with letrozole and Ibrance.  Right axillary lymph node biopsy 06/15/2021: Metastatic mammary carcinoma,  Guardant 360 05/30/2021: No reportable alterations Caris molecular testing: ER/PR positive, HER2 low, TMB low.  Return to clinic in 3 weeks for next cycle of treatment.

## 2021-08-02 ENCOUNTER — Telehealth: Payer: Self-pay | Admitting: Hematology and Oncology

## 2021-08-02 NOTE — Telephone Encounter (Signed)
Scheduled appointment per 09/27 los. Patient is aware.

## 2021-08-07 ENCOUNTER — Other Ambulatory Visit: Payer: Self-pay | Admitting: *Deleted

## 2021-08-07 ENCOUNTER — Other Ambulatory Visit: Payer: Self-pay | Admitting: Hematology and Oncology

## 2021-08-07 ENCOUNTER — Other Ambulatory Visit (HOSPITAL_COMMUNITY): Payer: Self-pay

## 2021-08-07 MED ORDER — DIAZEPAM 5 MG PO TABS: 5.0000 mg | ORAL_TABLET | Freq: Two times a day (BID) | ORAL | 3 refills | Status: DC | PRN

## 2021-08-07 MED ORDER — OXYCODONE HCL 10 MG PO TABS: 10.0000 mg | ORAL_TABLET | ORAL | 0 refills | Status: DC | PRN

## 2021-08-15 ENCOUNTER — Other Ambulatory Visit (HOSPITAL_COMMUNITY): Payer: Self-pay

## 2021-08-16 DIAGNOSIS — S21109A Unspecified open wound of unspecified front wall of thorax without penetration into thoracic cavity, initial encounter: Secondary | ICD-10-CM | POA: Diagnosis not present

## 2021-08-16 DIAGNOSIS — C7949 Secondary malignant neoplasm of other parts of nervous system: Secondary | ICD-10-CM | POA: Diagnosis not present

## 2021-08-23 ENCOUNTER — Ambulatory Visit: Payer: Medicare PPO

## 2021-08-25 ENCOUNTER — Other Ambulatory Visit: Payer: Self-pay

## 2021-08-25 ENCOUNTER — Encounter (HOSPITAL_COMMUNITY): Payer: Self-pay | Admitting: Emergency Medicine

## 2021-08-25 ENCOUNTER — Inpatient Hospital Stay (HOSPITAL_COMMUNITY)
Admission: EM | Admit: 2021-08-25 | Discharge: 2021-09-05 | DRG: 863 | Disposition: A | Discharge status: Home / Self Care | Payer: Medicare PPO | Attending: Family Medicine | Admitting: Family Medicine

## 2021-08-25 ENCOUNTER — Telehealth: Payer: Self-pay | Admitting: *Deleted

## 2021-08-25 DIAGNOSIS — Z9011 Acquired absence of right breast and nipple: Secondary | ICD-10-CM

## 2021-08-25 DIAGNOSIS — G893 Neoplasm related pain (acute) (chronic): Secondary | ICD-10-CM | POA: Diagnosis present

## 2021-08-25 DIAGNOSIS — C7951 Secondary malignant neoplasm of bone: Secondary | ICD-10-CM | POA: Diagnosis present

## 2021-08-25 DIAGNOSIS — Z83438 Family history of other disorder of lipoprotein metabolism and other lipidemia: Secondary | ICD-10-CM | POA: Diagnosis not present

## 2021-08-25 DIAGNOSIS — T8142XA Infection following a procedure, deep incisional surgical site, initial encounter: Secondary | ICD-10-CM | POA: Diagnosis present

## 2021-08-25 DIAGNOSIS — Z515 Encounter for palliative care: Secondary | ICD-10-CM | POA: Diagnosis not present

## 2021-08-25 DIAGNOSIS — Z853 Personal history of malignant neoplasm of breast: Secondary | ICD-10-CM

## 2021-08-25 DIAGNOSIS — C50411 Malignant neoplasm of upper-outer quadrant of right female breast: Secondary | ICD-10-CM | POA: Diagnosis present

## 2021-08-25 DIAGNOSIS — M62838 Other muscle spasm: Secondary | ICD-10-CM | POA: Diagnosis not present

## 2021-08-25 DIAGNOSIS — Y838 Other surgical procedures as the cause of abnormal reaction of the patient, or of later complication, without mention of misadventure at the time of the procedure: Secondary | ICD-10-CM | POA: Diagnosis present

## 2021-08-25 DIAGNOSIS — C7981 Secondary malignant neoplasm of breast: Secondary | ICD-10-CM | POA: Diagnosis not present

## 2021-08-25 DIAGNOSIS — S21009A Unspecified open wound of unspecified breast, initial encounter: Secondary | ICD-10-CM | POA: Diagnosis not present

## 2021-08-25 DIAGNOSIS — Z823 Family history of stroke: Secondary | ICD-10-CM

## 2021-08-25 DIAGNOSIS — Z28310 Unvaccinated for covid-19: Secondary | ICD-10-CM

## 2021-08-25 DIAGNOSIS — E876 Hypokalemia: Secondary | ICD-10-CM | POA: Diagnosis not present

## 2021-08-25 DIAGNOSIS — R52 Pain, unspecified: Secondary | ICD-10-CM | POA: Diagnosis not present

## 2021-08-25 DIAGNOSIS — D849 Immunodeficiency, unspecified: Secondary | ICD-10-CM | POA: Diagnosis present

## 2021-08-25 DIAGNOSIS — F419 Anxiety disorder, unspecified: Secondary | ICD-10-CM | POA: Diagnosis present

## 2021-08-25 DIAGNOSIS — Z803 Family history of malignant neoplasm of breast: Secondary | ICD-10-CM

## 2021-08-25 DIAGNOSIS — Z888 Allergy status to other drugs, medicaments and biological substances status: Secondary | ICD-10-CM

## 2021-08-25 DIAGNOSIS — Z5320 Procedure and treatment not carried out because of patient's decision for unspecified reasons: Secondary | ICD-10-CM | POA: Diagnosis present

## 2021-08-25 DIAGNOSIS — Z20822 Contact with and (suspected) exposure to covid-19: Secondary | ICD-10-CM | POA: Diagnosis present

## 2021-08-25 DIAGNOSIS — Z8249 Family history of ischemic heart disease and other diseases of the circulatory system: Secondary | ICD-10-CM

## 2021-08-25 DIAGNOSIS — Z91018 Allergy to other foods: Secondary | ICD-10-CM

## 2021-08-25 DIAGNOSIS — Z800 Family history of malignant neoplasm of digestive organs: Secondary | ICD-10-CM

## 2021-08-25 DIAGNOSIS — Z79899 Other long term (current) drug therapy: Secondary | ICD-10-CM

## 2021-08-25 DIAGNOSIS — C7949 Secondary malignant neoplasm of other parts of nervous system: Secondary | ICD-10-CM | POA: Diagnosis not present

## 2021-08-25 DIAGNOSIS — Z7189 Other specified counseling: Secondary | ICD-10-CM | POA: Diagnosis not present

## 2021-08-25 DIAGNOSIS — G9520 Unspecified cord compression: Secondary | ICD-10-CM | POA: Diagnosis present

## 2021-08-25 DIAGNOSIS — Z833 Family history of diabetes mellitus: Secondary | ICD-10-CM

## 2021-08-25 DIAGNOSIS — S21109A Unspecified open wound of unspecified front wall of thorax without penetration into thoracic cavity, initial encounter: Secondary | ICD-10-CM | POA: Diagnosis not present

## 2021-08-25 DIAGNOSIS — M726 Necrotizing fasciitis: Secondary | ICD-10-CM | POA: Diagnosis not present

## 2021-08-25 DIAGNOSIS — I10 Essential (primary) hypertension: Secondary | ICD-10-CM | POA: Diagnosis present

## 2021-08-25 DIAGNOSIS — Z923 Personal history of irradiation: Secondary | ICD-10-CM

## 2021-08-25 DIAGNOSIS — C50919 Malignant neoplasm of unspecified site of unspecified female breast: Secondary | ICD-10-CM | POA: Diagnosis not present

## 2021-08-25 DIAGNOSIS — L089 Local infection of the skin and subcutaneous tissue, unspecified: Secondary | ICD-10-CM | POA: Diagnosis not present

## 2021-08-25 DIAGNOSIS — G4700 Insomnia, unspecified: Secondary | ICD-10-CM | POA: Diagnosis present

## 2021-08-25 DIAGNOSIS — Z79811 Long term (current) use of aromatase inhibitors: Secondary | ICD-10-CM

## 2021-08-25 DIAGNOSIS — Z880 Allergy status to penicillin: Secondary | ICD-10-CM

## 2021-08-25 DIAGNOSIS — T148XXA Other injury of unspecified body region, initial encounter: Secondary | ICD-10-CM | POA: Diagnosis not present

## 2021-08-25 LAB — CBC WITH DIFFERENTIAL/PLATELET
Abs Immature Granulocytes: 0.01 10*3/uL (ref 0.00–0.07)
Basophils Absolute: 0.1 10*3/uL (ref 0.0–0.1)
Basophils Relative: 3 %
Eosinophils Absolute: 0.1 10*3/uL (ref 0.0–0.5)
Eosinophils Relative: 3 %
HCT: 33 % — ABNORMAL LOW (ref 36.0–46.0)
Hemoglobin: 11.1 g/dL — ABNORMAL LOW (ref 12.0–15.0)
Immature Granulocytes: 0 %
Lymphocytes Relative: 29 %
Lymphs Abs: 0.7 10*3/uL (ref 0.7–4.0)
MCH: 32.8 pg (ref 26.0–34.0)
MCHC: 33.6 g/dL (ref 30.0–36.0)
MCV: 97.6 fL (ref 80.0–100.0)
Monocytes Absolute: 0.3 10*3/uL (ref 0.1–1.0)
Monocytes Relative: 11 %
Neutro Abs: 1.3 10*3/uL — ABNORMAL LOW (ref 1.7–7.7)
Neutrophils Relative %: 54 %
Platelets: 279 10*3/uL (ref 150–400)
RBC: 3.38 MIL/uL — ABNORMAL LOW (ref 3.87–5.11)
RDW: 15.2 % (ref 11.5–15.5)
WBC: 2.4 10*3/uL — ABNORMAL LOW (ref 4.0–10.5)
nRBC: 0 % (ref 0.0–0.2)

## 2021-08-25 LAB — BASIC METABOLIC PANEL
Anion gap: 10 (ref 5–15)
BUN: 14 mg/dL (ref 6–20)
CO2: 25 mmol/L (ref 22–32)
Calcium: 8.9 mg/dL (ref 8.9–10.3)
Chloride: 103 mmol/L (ref 98–111)
Creatinine, Ser: 1 mg/dL (ref 0.44–1.00)
GFR, Estimated: >60 mL/min (ref >60)
Glucose, Bld: 109 mg/dL — ABNORMAL HIGH (ref 70–99)
Potassium: 3.6 mmol/L (ref 3.5–5.1)
Sodium: 138 mmol/L (ref 135–145)

## 2021-08-25 LAB — LACTIC ACID, PLASMA: Lactic Acid, Venous: 1.2 mmol/L (ref 0.5–1.9)

## 2021-08-25 NOTE — ED Provider Notes (Signed)
Emergency Medicine Provider Triage Evaluation Note  Misty Arnold , a 50 y.o. female  was evaluated in triage.  Pt complains of increased drainage from breast wound.  She is followed by the wound clinic, but requested noon today.  She is also not received some of her supplies to help her adequately care for the wound at home, including nonadhesive dressings.  Over the last 3 to 4 days, she has had increased drainage with a change in color (drainage is now more green), that has been more malodorous.  She has also had more bleeding from the wound, but suspect that this is because she is out of the nonadherent dressings that she was previously using.  She has been more cold, but is unsure if this is worsened over the last few days.  No documented fever.  No shortness of breath.  She has been having chest pain, but thinks that this is secondary to metastasis in her ribs.  She was advised by oncology to come to the ER for further work-up and evaluation.  Review of Systems  Positive: Wounds, increased drainage, chills Negative: Fever, rash, shortness of breath  Physical Exam  BP 120/79 (BP Location: Right Arm)   Pulse (!) 115   Temp 98.1 F (36.7 C) (Oral)   Resp 16   Ht 5\' 5"  (1.651 m)   Wt 60 kg   SpO2 99%   BMI 22.01 kg/m  Gen:   Awake, no distress   Resp:  Normal effort  MSK:   Moves extremities without difficulty  Other:  Draining wound noted to the right breast.  There is malodorous drainage.  Minimal bleeding noted from the wound.  ABD is saturated.  Medical Decision Making  Medically screening exam initiated at 10:15 PM.  Appropriate orders placed.  Clorissa Gruenberg was informed that the remainder of the evaluation will be completed by another provider, this initial triage assessment does not replace that evaluation, and the importance of remaining in the ED until their evaluation is complete.  Labs have been ordered.  She will require further work-up evaluation in the emergency  department.   Joline Maxcy A, PA-C 08/25/21 2225    Gareth Morgan, MD 08/28/21 1816

## 2021-08-25 NOTE — ED Notes (Signed)
Right breast wound redressed with nonadherent dressing and ABD pad x2.

## 2021-08-25 NOTE — Telephone Encounter (Signed)
Received call from pt stating during chest wound change today she noticed the dressing to have a green tinge and foul odor as well as increase in pain in that area not alleviated by current narcotics.  Pt denies fever at this time.  Pt states she has attempted to call the [facility] several times today with no return call and is requesting advice from MD. Per MD pt needing to be seen in ED for further evaluation and treatment.  Pt verbalized understanding and RN placed call to ED charge Thurmond Butts with update on pt condition.

## 2021-08-25 NOTE — ED Triage Notes (Signed)
Patient reports increasing malodorous greenish drainage at right breast cancer surgical wound this week with pain radiating to back , denies fever or chills .

## 2021-08-26 ENCOUNTER — Encounter (HOSPITAL_COMMUNITY): Payer: Self-pay | Admitting: Internal Medicine

## 2021-08-26 DIAGNOSIS — T148XXA Other injury of unspecified body region, initial encounter: Secondary | ICD-10-CM | POA: Diagnosis present

## 2021-08-26 DIAGNOSIS — I10 Essential (primary) hypertension: Secondary | ICD-10-CM | POA: Diagnosis present

## 2021-08-26 DIAGNOSIS — L089 Local infection of the skin and subcutaneous tissue, unspecified: Secondary | ICD-10-CM | POA: Diagnosis present

## 2021-08-26 LAB — RESP PANEL BY RT-PCR (FLU A&B, COVID) ARPGX2
Influenza A by PCR: NEGATIVE
Influenza B by PCR: NEGATIVE
SARS Coronavirus 2 by RT PCR: NEGATIVE

## 2021-08-26 MED ORDER — VANCOMYCIN HCL IN DEXTROSE 1-5 GM/200ML-% IV SOLN
1000.0000 mg | Freq: Once | INTRAVENOUS | Status: AC
  Administered 2021-08-26: 1000 mg via INTRAVENOUS
  Filled 2021-08-26: qty 200

## 2021-08-26 MED ORDER — AMLODIPINE BESYLATE 10 MG PO TABS
10.0000 mg | ORAL_TABLET | Freq: Every day | ORAL | Status: DC
  Administered 2021-08-26 – 2021-09-05 (×11): 10 mg via ORAL
  Filled 2021-08-26 (×9): qty 1
  Filled 2021-08-26: qty 2
  Filled 2021-08-26: qty 1

## 2021-08-26 MED ORDER — ENOXAPARIN SODIUM 40 MG/0.4ML IJ SOSY
40.0000 mg | PREFILLED_SYRINGE | INTRAMUSCULAR | Status: DC
  Administered 2021-08-26 – 2021-09-05 (×11): 40 mg via SUBCUTANEOUS
  Filled 2021-08-26 (×11): qty 0.4

## 2021-08-26 MED ORDER — CLINDAMYCIN HCL 300 MG PO CAPS: 300.0000 mg | ORAL_CAPSULE | Freq: Four times a day (QID) | ORAL | 0 refills | Status: DC

## 2021-08-26 MED ORDER — PALBOCICLIB 125 MG PO TABS: 125.0000 mg | ORAL_TABLET | ORAL | Status: DC

## 2021-08-26 MED ORDER — DEXAMETHASONE 0.5 MG PO TABS
1.0000 mg | ORAL_TABLET | Freq: Every day | ORAL | Status: DC | PRN
  Administered 2021-08-31: 1 mg via ORAL
  Filled 2021-08-26: qty 2

## 2021-08-26 MED ORDER — CLINDAMYCIN PHOSPHATE 600 MG/50ML IV SOLN: 600.0000 mg | Freq: Once | INTRAVENOUS | Status: DC

## 2021-08-26 MED ORDER — LACTATED RINGERS IV SOLN: INTRAVENOUS | Status: DC

## 2021-08-26 MED ORDER — POLYETHYLENE GLYCOL 3350 17 G PO PACK
17.0000 g | PACK | Freq: Every day | ORAL | Status: DC | PRN
  Administered 2021-08-26 – 2021-08-28 (×2): 17 g via ORAL
  Filled 2021-08-26 (×2): qty 1

## 2021-08-26 MED ORDER — LETROZOLE 2.5 MG PO TABS
2.5000 mg | ORAL_TABLET | Freq: Every day | ORAL | Status: DC
  Administered 2021-08-26 – 2021-09-05 (×11): 2.5 mg via ORAL
  Filled 2021-08-26 (×11): qty 1

## 2021-08-26 MED ORDER — ASCORBIC ACID 500 MG PO TABS
1000.0000 mg | ORAL_TABLET | Freq: Every day | ORAL | Status: DC
  Administered 2021-08-26 – 2021-09-05 (×11): 1000 mg via ORAL
  Filled 2021-08-26 (×11): qty 2

## 2021-08-26 MED ORDER — SODIUM CHLORIDE 0.9 % IV SOLN: 2.0000 g | Freq: Three times a day (TID) | INTRAVENOUS | Status: DC

## 2021-08-26 MED ORDER — SODIUM CHLORIDE 0.9 % IV SOLN
2.0000 g | Freq: Two times a day (BID) | INTRAVENOUS | Status: DC
  Administered 2021-08-26 – 2021-08-27 (×4): 2 g via INTRAVENOUS
  Filled 2021-08-26 (×5): qty 2

## 2021-08-26 MED ORDER — ACETAMINOPHEN 650 MG RE SUPP: 650.0000 mg | Freq: Four times a day (QID) | RECTAL | Status: DC | PRN

## 2021-08-26 MED ORDER — MORPHINE SULFATE (PF) 2 MG/ML IV SOLN
2.0000 mg | INTRAVENOUS | Status: DC | PRN
  Administered 2021-08-26 – 2021-08-28 (×6): 2 mg via INTRAVENOUS
  Filled 2021-08-26 (×6): qty 1

## 2021-08-26 MED ORDER — DIAZEPAM 5 MG PO TABS
5.0000 mg | ORAL_TABLET | Freq: Two times a day (BID) | ORAL | Status: DC | PRN
  Administered 2021-08-28 – 2021-08-29 (×2): 5 mg via ORAL
  Filled 2021-08-26 (×2): qty 1

## 2021-08-26 MED ORDER — BISACODYL 10 MG RE SUPP
10.0000 mg | Freq: Every day | RECTAL | Status: DC | PRN
  Administered 2021-08-29: 10 mg via RECTAL
  Filled 2021-08-26: qty 1

## 2021-08-26 MED ORDER — FLAXSEED OIL OIL: 5.0000 mL | TOPICAL_OIL | Freq: Three times a day (TID) | Status: DC

## 2021-08-26 MED ORDER — DOCUSATE SODIUM 100 MG PO CAPS
100.0000 mg | ORAL_CAPSULE | Freq: Two times a day (BID) | ORAL | Status: DC
  Administered 2021-08-26 – 2021-09-05 (×21): 100 mg via ORAL
  Filled 2021-08-26 (×21): qty 1

## 2021-08-26 MED ORDER — ONDANSETRON HCL 4 MG/2ML IJ SOLN: 4.0000 mg | Freq: Four times a day (QID) | INTRAMUSCULAR | Status: DC | PRN

## 2021-08-26 MED ORDER — ONDANSETRON HCL 4 MG PO TABS: 4.0000 mg | ORAL_TABLET | Freq: Four times a day (QID) | ORAL | Status: DC | PRN

## 2021-08-26 MED ORDER — CALCIUM CARBONATE-VITAMIN D 500-200 MG-UNIT PO TABS
1.0000 | ORAL_TABLET | Freq: Two times a day (BID) | ORAL | Status: DC
  Administered 2021-08-26 – 2021-09-04 (×18): 1 via ORAL
  Filled 2021-08-26 (×42): qty 1

## 2021-08-26 MED ORDER — OXYCODONE HCL 5 MG PO TABS
10.0000 mg | ORAL_TABLET | ORAL | Status: DC | PRN
  Administered 2021-08-26 – 2021-09-04 (×19): 10 mg via ORAL
  Filled 2021-08-26 (×20): qty 2

## 2021-08-26 MED ORDER — VITAMIN D 25 MCG (1000 UNIT) PO TABS
5000.0000 [IU] | ORAL_TABLET | Freq: Two times a day (BID) | ORAL | Status: DC
  Administered 2021-08-26 – 2021-09-05 (×22): 5000 [IU] via ORAL
  Filled 2021-08-26 (×22): qty 5

## 2021-08-26 MED ORDER — VANCOMYCIN HCL 1250 MG/250ML IV SOLN
1250.0000 mg | INTRAVENOUS | Status: DC
  Administered 2021-08-27 – 2021-08-28 (×2): 1250 mg via INTRAVENOUS
  Filled 2021-08-26 (×3): qty 250

## 2021-08-26 MED ORDER — CLINDAMYCIN HCL 150 MG PO CAPS
300.0000 mg | ORAL_CAPSULE | Freq: Once | ORAL | Status: DC
  Filled 2021-08-26: qty 2

## 2021-08-26 MED ORDER — HYDROMORPHONE HCL 1 MG/ML IJ SOLN
1.0000 mg | Freq: Once | INTRAMUSCULAR | Status: AC
  Administered 2021-08-26: 1 mg via INTRAVENOUS
  Filled 2021-08-26: qty 1

## 2021-08-26 MED ORDER — ACETAMINOPHEN 325 MG PO TABS
650.0000 mg | ORAL_TABLET | Freq: Four times a day (QID) | ORAL | Status: DC | PRN
  Administered 2021-08-27 – 2021-09-03 (×2): 650 mg via ORAL
  Filled 2021-08-26: qty 2

## 2021-08-26 MED ORDER — HYDRALAZINE HCL 20 MG/ML IJ SOLN: 5.0000 mg | INTRAMUSCULAR | Status: DC | PRN

## 2021-08-26 NOTE — Progress Notes (Signed)
Pharmacy Antibiotic Note  Misty Arnold is a 50 y.o. female admitted on 08/25/2021 with cellulitis.  Pharmacy has been consulted for vancomycin dosing.  50 yof with a history of  HTN and metastatic breast cancer to bone presenting with wound infection. Patient presenting with wound infection.  SCr 1 WBC 2.4; LA 1.2  Plan: Cefepime 2g q12h Vancomycin 1000mg  given in ED Will start vancomycin 1250 mg q24hr (eAUC 540) Trend WBC, fever, renal function Levels at steady state F/u cultures De-escalate when able  Height: 5\' 5"  (165.1 cm) Weight: 60 kg (132 lb 4.4 oz) IBW/kg (Calculated) : 57  Temp (24hrs), Avg:98.2 F (36.8 C), Min:98.1 F (36.7 C), Max:98.2 F (36.8 C)  Recent Labs  Lab 08/25/21 2236  WBC 2.4*  CREATININE 1.00  LATICACIDVEN 1.2    Estimated Creatinine Clearance: 59.2 mL/min (by C-G formula based on SCr of 1 mg/dL).    Allergies  Allergen Reactions   Amoxicillin     Other reaction(s): rash/itching   Penicillins Rash    Did it involve swelling of the face/tongue/throat, SOB, or low BP? no Did it involve sudden or severe rash/hives, skin peeling, or any reaction on the inside of your mouth or nose? Yes Did you need to seek medical attention at a hospital or doctor's office? No When did it last happen? early 20's     If all above answers are "NO", may proceed with cephalosporin use.   Pork-Derived Products    Penicillin G    Tamoxifen Hypertension   Antimicrobials this admission: cefepime 10/22 >>  vancomycin 10/22 >>   Microbiology results: Pending  Thank you for allowing pharmacy to be a part of this patient's care.  Lorelei Pont, PharmD, BCPS 08/26/2021 1:42 PM ED Clinical Pharmacist -  [phone]

## 2021-08-26 NOTE — ED Provider Notes (Addendum)
[facility] EMERGENCY DEPARTMENT Provider Note   CSN: [id number] Arrival date & time: 08/25/21  1947     History Chief Complaint  Patient presents with   Infected Breast Wound    Misty Arnold is a 50 y.o. female.  Patient is a 50 year old female with past medical history of breast cancer, hypertension, back surgery secondary to metastasis to the spine.  Patient presenting today with complaints of drainage from her breast cancer.  Patient has an open, cancerous wound to the right breast that has been treated by wound care.  She is now having more drainage and having to change the dressings more frequently.  She has run out of her wound care supplies due to this.  She denies any fevers or chills.  The history is provided by the patient.      Past Medical History:  Diagnosis Date   Abnormal uterine bleeding    Anemia    Breast cancer ([place])    Depression    Fibroid    Hormone disorder    Hypertension    Infertility, female     Patient Active Problem List   Diagnosis Date Noted   Wound infection 05/12/2021   Cancer related pain 05/12/2021   AKI (acute kidney injury) ([place]) 05/11/2021   Palliative care patient 02/15/2020   Myelopathy ([place]) 01/08/2020   Incomplete paraplegia ([place]) 01/08/2020   Epidural mass    Metastatic breast cancer ([facility])    Weakness of both legs    Leucocytosis    Acute blood loss anemia    Neurogenic bladder    Neurogenic bowel    S/P laminectomy 01/04/2020   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[place]) 01/04/2020   Breast cancer of upper-outer quadrant of right female breast ([place]) 11/02/2014    Past Surgical History:  Procedure Laterality Date   ABDOMINAL HYSTERECTOMY     BREAST SURGERY     LAMINECTOMY N/A 01/04/2020   Procedure: Thoracic Eight, Thoracic Nine THORACIC LAMINECTOMY FOR TUMOR with Thoracic Seven-Thoracic Ten instrumentation;  Surgeon: [name], MD;  Location: [facility];  Service: Neurosurgery;  Laterality: N/A;   Thoracic Eight, Thoracic Nine THORACIC LAMINECTOMY FOR TUMOR with Thoracic Seven-Thoracic Ten instrumentation   MASTECTOMY     salivary gland stone       OB History     Gravida  0   Para  0   Term  0   Preterm  0   AB  0   Living  0      SAB  0   IAB  0   Ectopic  0   Multiple  0   Live Births  0           Family History  Problem Relation Age of Onset   Cancer Maternal Aunt        breast   Stroke Maternal Aunt    Heart attack Maternal Aunt    Cancer Maternal Grandfather    Colon cancer Maternal Grandfather    Stroke Maternal Grandfather    Cancer Paternal Grandfather    Colon cancer Paternal Grandfather    Stroke Mother    Heart attack Mother    Diabetes Mother    Hyperlipidemia Mother    Heart disease Mother    Diabetes Father    Stroke Father    Stroke Paternal Grandmother    Heart attack Paternal Grandmother    Heart failure Paternal Grandmother    Diabetes Paternal Grandmother    Hyperlipidemia Paternal  Grandmother    CAD Paternal Grandmother    Stroke Maternal Grandmother    Breast cancer Maternal Aunt     Social History   Tobacco Use   Smoking status: Never   Smokeless tobacco: Never  Vaping Use   Vaping Use: Never used  Substance Use Topics   Alcohol use: No   Drug use: No    Home Medications Prior to Admission medications   Medication Sig Start Date End Date Taking? Authorizing Provider  acetaminophen (TYLENOL) 325 MG tablet Take 2 tablets (650 mg total) by mouth every 4 (four) hours as needed for mild pain ((score 1 to 3) or temp > 100.5). 01/08/20   Viona Gilmore D, NP  amLODipine (NORVASC) 10 MG tablet Take 1 tablet (10 mg total) by mouth daily. 05/13/21   Dwyane Dee, MD  Ascorbic Acid (VITAMIN C PO) Take by mouth.    [provider]  Bioflavonoid Products (ESTER C PO) Take 1 tablet by mouth with breakfast, with lunch, and with evening meal.     [provider]  bisacodyl (DULCOLAX) 10 MG suppository  Place 1 suppository (10 mg total) rectally daily as needed for moderate constipation. 01/08/20   Viona Gilmore D, NP  BLACK CURRANT SEED OIL PO Take by mouth.    [provider]  Calcium Carb-Cholecalciferol (OYSTER SHELL CALCIUM W/D) 500-200 MG-UNIT TABS TAKE 1 TABLET BY MOUTH TWICE A DAY WITH MEALS 03/23/21   Nicholas Lose, MD  Cholecalciferol (VITAMIN D3) 125 MCG (5000 UT) CAPS TAKE 1 CAPSULE (5,000 UNITS TOTAL) BY MOUTH 2 (TWO) TIMES DAILY. 08/07/21   Nicholas Lose, MD  diazepam (VALIUM) 5 MG tablet Take 1 tablet (5 mg total) by mouth every 12 (twelve) hours as needed for muscle spasms. 08/07/21   Nicholas Lose, MD  Flaxseed Oil OIL Take 5 mLs by mouth in the morning, at noon, and at bedtime.     [provider]  goserelin (ZOLADEX) 3.6 MG injection Inject 3.6 mg into the skin every 28 (twenty-eight) days.    [provider]  letrozole [facility]) 2.5 MG tablet TAKE 1 TABLET BY MOUTH EVERY DAY 09/20/20   Nicholas Lose, MD  lidocaine-prilocaine (EMLA) cream APPLY TOPICALLY AS NEEDED. 12/22/20 12/22/21  Nicholas Lose, MD  Oxycodone HCl 10 MG TABS Take 1 tablet (10 mg total) by mouth every 3 (three) hours as needed ((score 7 to 10)). 08/07/21   Nicholas Lose, MD  palbociclib (IBRANCE) 125 MG tablet TAKE 1 TABLET (125 MG TOTAL) BY MOUTH DAILY. TAKE FOR 21 DAYS ON, 7 DAYS OFF, REPEAT EVERY 28 DAYS. 06/22/21 06/22/22  Nicholas Lose, MD  polyethylene glycol (MIRALAX / GLYCOLAX) 17 g packet Take 17 g by mouth daily as needed for mild constipation. 01/22/20   Bayard Hugger, NP  prochlorperazine (COMPAZINE) 10 MG tablet Take 1 tablet (10 mg total) by mouth every 6 (six) hours as needed (Nausea or vomiting). 07/24/21 08/01/21  Nicholas Lose, MD    Allergies    Amoxicillin, Penicillins, Pork-derived products, Penicillin g, and Tamoxifen  Review of Systems   Review of Systems  All other systems reviewed and are negative.  Physical Exam Updated Vital Signs BP (!) 138/99   Pulse (!) 106    Temp 98.1 F (36.7 C) (Oral)   Resp 18   Ht 5\' 5"  (1.651 m)   Wt 60 kg   SpO2 99%   BMI 22.01 kg/m   Physical Exam Vitals and nursing note reviewed.  Constitutional:  General: She is not in acute distress.    Appearance: Normal appearance. She is not ill-appearing.  HENT:     Head: Normocephalic and atraumatic.  Pulmonary:     Effort: Pulmonary effort is normal.  Skin:    General: Skin is warm and dry.     Comments: Examination of the breast shows a large, open, cancerous area.  There is greenish drainage noted.  Neurological:     Mental Status: She is alert and oriented to person, place, and time.    ED Results / Procedures / Treatments   Labs (all labs ordered are listed, but only abnormal results are displayed) Labs Reviewed  CBC WITH DIFFERENTIAL/PLATELET - Abnormal; Notable for the following components:      Result Value   WBC 2.4 (*)    RBC 3.38 (*)    Hemoglobin 11.1 (*)    HCT 33.0 (*)    Neutro Abs 1.3 (*)    All other components within normal limits  BASIC METABOLIC PANEL - Abnormal; Notable for the following components:   Glucose, Bld 109 (*)    All other components within normal limits  LACTIC ACID, PLASMA    EKG None  Radiology No results found.  Procedures Procedures   Medications Ordered in ED Medications - No data to display  ED Course  I have reviewed the triage vital signs and the nursing notes.  Pertinent labs & imaging results that were available during my care of the patient were reviewed by me and considered in my medical decision making (see chart for details).    MDM Rules/Calculators/A&P  Patient with history of metastatic breast cancer presenting with drainage from her open wound.  Patient will be given antibiotics in case this is cellulitis/infection.  Dressing supplies also given.    She tells me she was sent here by her oncologist and her oncologist nurse for admission for wound care, IV antibiotics, and pain  control.  I have spoken with the hospitalist who will evaluate.  Final Clinical Impression(s) / ED Diagnoses Final diagnoses:  None    Rx / DC Orders ED Discharge Orders     None        Veryl Speak, MD 08/26/21 8099    Veryl Speak, MD 08/26/21 [phone]

## 2021-08-26 NOTE — ED Notes (Signed)
Pt resting in stretcher. Bandages intact to chest wound. Pt denies needs, reports mild pain. Requesting something to eat prior to pain medication admin. RN to reach out to hospitalist team to clarify diet order. Pt verbalizes understanding. VSS, no distress noted, call light in reach.

## 2021-08-26 NOTE — H&P (Signed)
History and Physical    Hillarie Harrigan JKK:[id number] DOB: 1970/11/09 DOA: 08/25/2021  PCP: Nicholas Lose, MD Consultants:  [facility] - neurosurgery; [facility] - pain management; wound care Patient coming from:  Home - lives with boyfriend or mother; NOK: Boyfriend, Eula Flax, [phone]  Chief Complaint: Wound infection  HPI: Misty Arnold is a 50 y.o. female with medical history significant of HTN and metastatic breast cancer to bone presenting with wound infection.  She seemed to be doing ok since her surgery.  Dr. Trenton Gammon said it would be 2 years before she could walk due to spinal mets and surgery.  The breast cancer just keeps coming back.  She had HHN but she has been taking care of the wound herself.  Dr. Lindi Adie referred her to wound care.  Wound care sent her all the supplies she needed the first time.  She required more supplies than were provided through Medicare.  2 weeks ago, she needed new supplies and the box came without everything needed.  When she took off the ABD pads it would stick to the wound which was causing bleeding and irritation.  She tried to get more supplies without success - Medicare wouldn't provide the supplies.  She started noticing a color change in her fluid and decided to come to the ER.  The fluid amount is increasing, needs to change bandages q5h.  It was previously light yellow and is now light green.  Dr. Geralyn Flash nurse recommended that she come to the ER.  She has not had pain meds since 5pm yesterday.  Within the last year, she is having increasing pain.  They have told her she shouldn't wait so long.  There is some cancer in her rib and back and this makes the pain worse at night.  This is year 39 of her cancer.  In 2011, she had stage 0 and had a mastectomy.  She does a lot of natural treatments and thinks she could have reversed it; she now regrets the mastectomy.  She has had multiple surgeries.  In 2016, she had a TRAM procedure.  When it recurred there was  no muscle left and it has been open since 2019.  Her back surgery was in 01/2020 with a tumor resection.  She is walking with a cane, was out of the wheelchair in 4 months after rigorous PT.  The pain takes a lot of her energy and she feels weaker now.  She wakes up in the middle of the night with pain.  She has never done IV chemo but takes monthly Zoladex and Letrozole and Ibrance daily.  It has been working but if she is a little stressed nothing works - as long as she keeps the stress at [place] the cancer gets better.  She took 3 weeks of back radiation, refused other.    ED Course: Carryover, per Dr. Marlowe Sax:  50 year old with metastatic breast cancer --> infected fungating chest wall mass and was sent to be admitted for IV antibiotics and pain control by oncology.  Slightly tachycardic.  WBC 2.4, lactate normal.  Patient was given vancomycin.  Review of Systems: As per HPI; otherwise review of systems reviewed and negative.   Ambulatory Status:  Ambulates with a cane  COVID Vaccine Status:  None  Past Medical History:  Diagnosis Date   Abnormal uterine bleeding    Anemia    Breast cancer ([place])    Depression    Fibroid    Hormone disorder    Hypertension  Infertility, female     Past Surgical History:  Procedure Laterality Date   ABDOMINAL HYSTERECTOMY     BREAST SURGERY     LAMINECTOMY N/A 01/04/2020   Procedure: Thoracic Eight, Thoracic Nine THORACIC LAMINECTOMY FOR TUMOR with Thoracic Seven-Thoracic Ten instrumentation;  Surgeon: [name], MD;  Location: [place];  Service: Neurosurgery;  Laterality: N/A;  Thoracic Eight, Thoracic Nine THORACIC LAMINECTOMY FOR TUMOR with Thoracic Seven-Thoracic Ten instrumentation   MASTECTOMY     salivary gland stone      Social History   Socioeconomic History   Marital status: Divorced    Spouse name: Not on file   Number of children: Not on file   Years of education: 12   Highest education level: Not on file  Occupational History    Occupation: disabled  Tobacco Use   Smoking status: Never   Smokeless tobacco: Never  Vaping Use   Vaping Use: Never used  Substance and Sexual Activity   Alcohol use: Not Currently    Comment: rare   Drug use: No   Sexual activity: Yes    Partners: Male    Birth control/protection: Surgical, Condom  Other Topics Concern   Not on file  Social History Narrative   Lives at home with her grandmother   Right handed   Caffeine seldom, coffee once or twice monthly   Social Determinants of Health   Financial Resource Strain: Not on file  Food Insecurity: Not on file  Transportation Needs: Not on file  Physical Activity: Not on file  Stress: Not on file  Social Connections: Not on file  Intimate Partner Violence: Not on file    Allergies  Allergen Reactions   Amoxicillin     Other reaction(s): rash/itching   Penicillins Rash    Did it involve swelling of the face/tongue/throat, SOB, or low BP? no Did it involve sudden or severe rash/hives, skin peeling, or any reaction on the inside of your mouth or nose? Yes Did you need to seek medical attention at a hospital or doctor's office? No When did it last happen? early 20's     If all above answers are "NO", may proceed with cephalosporin use.   Pork-Derived Products    Penicillin G    Tamoxifen Hypertension    Family History  Problem Relation Age of Onset   Stroke Mother    Heart attack Mother    Diabetes Mother    Hyperlipidemia Mother    Heart disease Mother    Diabetes Father    Stroke Father    Stroke Maternal Grandmother    Cancer Maternal Grandfather    Colon cancer Maternal Grandfather    Stroke Maternal Grandfather    Stroke Paternal Grandmother    Heart attack Paternal Grandmother    Heart failure Paternal Grandmother    Diabetes Paternal Grandmother    Hyperlipidemia Paternal Grandmother    CAD Paternal Grandmother    Cancer Paternal Grandfather    Colon cancer Paternal Grandfather    Cancer Maternal  Aunt 27       breast   Stroke Maternal Aunt    Heart attack Maternal Aunt    Breast cancer Maternal Aunt     Prior to Admission medications   Medication Sig Start Date End Date Taking? Authorizing Provider  acetaminophen (TYLENOL) 325 MG tablet Take 2 tablets (650 mg total) by mouth every 4 (four) hours as needed for mild pain ((score 1 to 3) or temp > 100.5). 01/08/20  Yes  Viona Gilmore D, NP  amLODipine (NORVASC) 10 MG tablet Take 1 tablet (10 mg total) by mouth daily. 05/13/21  Yes Dwyane Dee, MD  Ascorbic Acid (VITAMIN C PO) Take 1,000 mg by mouth daily.   Yes [provider]  bisacodyl (DULCOLAX) 10 MG suppository Place 1 suppository (10 mg total) rectally daily as needed for moderate constipation. 01/08/20  Yes Viona Gilmore D, NP  Calcium Carb-Cholecalciferol (OYSTER SHELL CALCIUM W/D) 500-200 MG-UNIT TABS TAKE 1 TABLET BY MOUTH TWICE A DAY WITH MEALS Patient taking differently: Take 1 tablet by mouth 2 (two) times daily with a meal. 03/23/21  Yes Nicholas Lose, MD  Cholecalciferol (VITAMIN D3) 125 MCG (5000 UT) CAPS TAKE 1 CAPSULE (5,000 UNITS TOTAL) BY MOUTH 2 (TWO) TIMES DAILY. Patient taking differently: Take 5,000 Units by mouth in the morning and at bedtime. 08/07/21  Yes Nicholas Lose, MD  clindamycin (CLEOCIN) 300 MG capsule Take 1 capsule (300 mg total) by mouth 4 (four) times daily. X 7 days 08/26/21  Yes Delo, Nathaneil Canary, MD  dexamethasone (DECADRON) 1 MG tablet Take 1 mg by mouth daily as needed (when appetite  is low). 08/07/21  Yes [provider]  diazepam (VALIUM) 5 MG tablet Take 1 tablet (5 mg total) by mouth every 12 (twelve) hours as needed for muscle spasms. 08/07/21  Yes Nicholas Lose, MD  Flaxseed Oil OIL Take 5 mLs by mouth in the morning, at noon, and at bedtime.    Yes [provider]  goserelin (ZOLADEX) 3.6 MG injection Inject 3.6 mg into the skin every 28 (twenty-eight) days.   Yes [provider]  letrozole (Naomi) 2.5 MG  tablet TAKE 1 TABLET BY MOUTH EVERY DAY Patient taking differently: Take 2.5 mg by mouth daily. 09/20/20  Yes Nicholas Lose, MD  lidocaine-prilocaine (EMLA) cream APPLY TOPICALLY AS NEEDED. Patient taking differently: Apply 1 application topically See admin instructions. Use with zoladex injection 12/22/20 12/22/21 Yes Nicholas Lose, MD  Oxycodone HCl 10 MG TABS Take 1 tablet (10 mg total) by mouth every 3 (three) hours as needed ((score 7 to 10)). 08/07/21  Yes Nicholas Lose, MD  palbociclib (IBRANCE) 125 MG tablet TAKE 1 TABLET (125 MG TOTAL) BY MOUTH DAILY. TAKE FOR 21 DAYS ON, 7 DAYS OFF, REPEAT EVERY 28 DAYS. Patient taking differently: Take 125 mg by mouth See admin instructions. Take for 21 days, then off for 7 days, repeat every 28 days 06/22/21 06/22/22 Yes Nicholas Lose, MD  polyethylene glycol (MIRALAX / GLYCOLAX) 17 g packet Take 17 g by mouth daily as needed for mild constipation. 01/22/20  Yes Bayard Hugger, NP  prochlorperazine (COMPAZINE) 10 MG tablet Take 1 tablet (10 mg total) by mouth every 6 (six) hours as needed (Nausea or vomiting). 07/24/21 08/01/21  Nicholas Lose, MD    Physical Exam: Vitals:   08/26/21 0400 08/26/21 0515 08/26/21 0730 08/26/21 1030  BP: 127/89 126/89 119/84 (!) 138/97  Pulse: (!) 104 (!) 102 84 97  Resp: 17 17 16 15   Temp:   98.2 F (36.8 C)   TempSrc:   Oral   SpO2: 99% 98% 97% 99%  Weight:      Height:         General:  Appears calm and comfortable and is in NAD Eyes:  PERRL, EOMI, normal lids, iris ENT:  grossly normal hearing, lips & tongue, mmm; appropriate dentition Neck:  no LAD, masses or thyromegaly Cardiovascular:  RRR, no m/r/g. No LE edema.  Respiratory:   CTA  bilaterally with no wheezes/rales/rhonchi.  Normal respiratory effort. Abdomen:  soft, NT, ND Skin:  s/p R mastectomy with necrotic mass extending along R chest wall, foul-smelling significant drainage from chest wall ulceration     Musculoskeletal:  mildly decreased tone  BUE < BLE, good ROM, no bony abnormality Psychiatric:  grossly normal mood and affect, speech fluent and appropriate, AOx3, ?mild delusions Neurologic:  CN 2-12 grossly intact, moves all extremities in coordinated fashion    Radiological Exams on Admission: Independently reviewed - see discussion in A/P where applicable  No results found.  EKG: not done   Labs on Admission: I have personally reviewed the available labs and imaging studies at the time of the admission.  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)   Essential hypertension   Infected wound -Patient with chest wall chronic wound associated with malignancy -Appears to have superinfection -Will cover with Cefepime and Vanc for now -No current concern for sepsis -She is immunocompromised -Wound care consult  Metastatic breast cancer -Patient has preferred holistic practices and so has declined some traditional therapies -She appears to believe that her metastatic disease could have been prevented with natural therapies had she not undergone mastectomy -She refused radiation treatment but did undergo limited palliative rads tx after spinal meds -She is currently on Zoladex, Ibrance, and Letrozole -She does not appear to recognize the severity of her condition -Will place palliative care consult -She is a full code at this time -Will continue her home herbals as well as Valium, Oxycodone -She takes Decadron when needed for appetite suppression  HTN -Continue Norvasc     Note: This patient has been tested and is negative for the novel coronavirus COVID-19. The patient has NOT been vaccinated against COVID-19.   Level of care: Med-Surg DVT prophylaxis:  Lovenox  Code Status:  Full - confirmed with  patient Family Communication: None present Disposition Plan:  The patient is from: home  Anticipated d/c is to: home, possibly with [facility] services once her cardiology issues have been resolved.  Anticipated d/c date will depend on clinical response to treatment, but possibly as early as tomorrow if she has excellent response to treatment  Patient is currently: acutely ill Consults called: Palliative care; wound consult; nutrition consult  Admission status:  It is my clinical opinion that referral for OBSERVATION is reasonable and necessary in this patient based on the above information provided. The aforementioned taken together are felt to place the patient at high risk for further clinical deterioration. However it is anticipated that the patient may be medically stable for discharge from the hospital within 24 to 48 hours.    Karmen Bongo MD [facility]   How to contact the [facility] Attending or Consulting provider [place] or covering provider during after hours [place], for this patient?  Check the care team in [facility] and look for a) attending/consulting [facility] provider listed and b) the [facility] team listed Log into [email or web address] and use [facility] universal password to access. If you do not have the password, please contact the hospital operator. Locate the [facility] provider you are looking for under [facility] and page to a number that you can be directly reached. If you still have difficulty reaching the provider, please page the [facility] (Director on Call)  for the Hospitalists listed on amion for assistance.   08/26/2021, 12:37 PM

## 2021-08-26 NOTE — Progress Notes (Signed)
Infected wound to right chest and upper abdominal area cleaned and redressed with petroleum gauze, NA dressing and covered with ABD dressing. Wound area thoroughly cleaned with saline moistened gauze before applying the dressings

## 2021-08-26 NOTE — Discharge Instructions (Addendum)
Continue local wound care as previously recommended by your wound care doctor.  Follow instructions provided by the nurses for dressing changes.  Follow-up with your wound care doctor and your cancer doctor.

## 2021-08-27 DIAGNOSIS — Z515 Encounter for palliative care: Secondary | ICD-10-CM | POA: Diagnosis not present

## 2021-08-27 DIAGNOSIS — C7949 Secondary malignant neoplasm of other parts of nervous system: Secondary | ICD-10-CM | POA: Diagnosis not present

## 2021-08-27 DIAGNOSIS — R52 Pain, unspecified: Secondary | ICD-10-CM | POA: Diagnosis not present

## 2021-08-27 DIAGNOSIS — L089 Local infection of the skin and subcutaneous tissue, unspecified: Secondary | ICD-10-CM | POA: Diagnosis not present

## 2021-08-27 DIAGNOSIS — Z7189 Other specified counseling: Secondary | ICD-10-CM

## 2021-08-27 DIAGNOSIS — T148XXA Other injury of unspecified body region, initial encounter: Secondary | ICD-10-CM | POA: Diagnosis not present

## 2021-08-27 LAB — BASIC METABOLIC PANEL
Anion gap: 7 (ref 5–15)
BUN: 9 mg/dL (ref 6–20)
CO2: 27 mmol/L (ref 22–32)
Calcium: 9.1 mg/dL (ref 8.9–10.3)
Chloride: 103 mmol/L (ref 98–111)
Creatinine, Ser: 0.97 mg/dL (ref 0.44–1.00)
GFR, Estimated: >60 mL/min (ref >60)
Glucose, Bld: 125 mg/dL — ABNORMAL HIGH (ref 70–99)
Potassium: 3.7 mmol/L (ref 3.5–5.1)
Sodium: 137 mmol/L (ref 135–145)

## 2021-08-27 LAB — CBC
HCT: 32.3 % — ABNORMAL LOW (ref 36.0–46.0)
Hemoglobin: 10.8 g/dL — ABNORMAL LOW (ref 12.0–15.0)
MCH: 32.2 pg (ref 26.0–34.0)
MCHC: 33.4 g/dL (ref 30.0–36.0)
MCV: 96.4 fL (ref 80.0–100.0)
Platelets: 275 10*3/uL (ref 150–400)
RBC: 3.35 MIL/uL — ABNORMAL LOW (ref 3.87–5.11)
RDW: 15.1 % (ref 11.5–15.5)
WBC: 2.2 10*3/uL — ABNORMAL LOW (ref 4.0–10.5)
nRBC: 0 % (ref 0.0–0.2)

## 2021-08-27 MED ORDER — OXYCODONE HCL ER 10 MG PO T12A
10.0000 mg | EXTENDED_RELEASE_TABLET | Freq: Two times a day (BID) | ORAL | Status: DC
  Administered 2021-08-27 – 2021-08-28 (×2): 10 mg via ORAL
  Filled 2021-08-27 (×2): qty 1

## 2021-08-27 MED ORDER — DIPHENHYDRAMINE HCL 25 MG PO CAPS
25.0000 mg | ORAL_CAPSULE | Freq: Once | ORAL | Status: AC
  Administered 2021-08-27: 25 mg via ORAL
  Filled 2021-08-27: qty 1

## 2021-08-27 MED ORDER — PALBOCICLIB 125 MG PO TABS
125.0000 mg | ORAL_TABLET | Freq: Every day | ORAL | Status: AC
  Administered 2021-08-27 – 2021-09-01 (×5): 125 mg via ORAL
  Filled 2021-08-27 (×5): qty 1

## 2021-08-27 MED ORDER — METRONIDAZOLE 0.75 % EX GEL
Freq: Every day | CUTANEOUS | Status: DC
  Filled 2021-08-27 (×2): qty 45

## 2021-08-27 NOTE — TOC Initial Note (Signed)
Transition of Care [facility]) - Initial/Assessment Note    Patient Details  Name: Misty Arnold MRN: [id number] Date of Birth: Dec 20, 1970  Transition of Care [facility]) CM/SW Contact:    Bartholomew Crews, RN Phone Number: [phone] 08/27/2021, 3:21 PM  Clinical Narrative:                  Spoke with patient at the bedside following palliative care visit. Patient has been fighting breast cancer x 10 years. Her family is supportive, and she has a boyfriend who helps her as needed.   Currently has wound to right chest, and is followed by [facility] wound care center. She does dressing changes herself. She has had [facility] nursing in the past, but did not find the service helpful at the time. She stated that her last shipment of supplies was incomplete, which challenged her with doing her dressing changes as she should.   TOC is following for transition needs.   Expected Discharge Plan: Home/Self Care Barriers to Discharge: Continued Medical Work up   Patient Goals and CMS Choice Patient states their goals for this hospitalization and ongoing recovery are:: return home CMS Medicare.gov Compare Post Acute Care list provided to:: Patient    Expected Discharge Plan and Services Expected Discharge Plan: Home/Self Care   Discharge Planning Services: CM Consult   Living arrangements for the past 2 months: Single Family Home                                      Prior Living Arrangements/Services Living arrangements for the past 2 months: Single Family Home Lives with:: Self Patient language and need for interpreter reviewed:: Yes Do you feel safe going back to the place where you live?: Yes      Need for Family Participation in Patient Care: No (Comment)     Criminal Activity/Legal Involvement Pertinent to Current Situation/Hospitalization: No - Comment as needed  Activities of Daily Living      Permission Sought/Granted                  Emotional Assessment Appearance::  Appears stated age Attitude/Demeanor/Rapport: Engaged Affect (typically observed): Accepting Orientation: : Oriented to Self, Oriented to Place, Oriented to  Time, Oriented to Situation Alcohol / Substance Use: Not Applicable Psych Involvement: No (comment)  Admission diagnosis:  Infected wound [T14.8XXA, L08.9] Wound infection [T14.8XXA, L08.9] Metastatic breast cancer ([place]) [C50.919] Patient Active Problem List   Diagnosis Date Noted   Infected wound 08/26/2021   Essential hypertension 08/26/2021   Wound infection 05/12/2021   Cancer related pain 05/12/2021   AKI (acute kidney injury) ([place]) 05/11/2021   Palliative care patient 02/15/2020   Myelopathy ([place]) 01/08/2020   Incomplete paraplegia ([place]) 01/08/2020   Epidural mass    Metastatic breast cancer ([place])    Weakness of both legs    Leucocytosis    Acute blood loss anemia    Neurogenic bladder    Neurogenic bowel    S/P laminectomy 01/04/2020   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[place]) 01/04/2020   Breast cancer of upper-outer quadrant of right female breast ([place]) 11/02/2014   PCP:  Nicholas Lose, MD Pharmacy:   [id number] [place], [address] [place] [address] Phone: [phone] Fax: [phone]     Social Determinants of Health (SDOH) Interventions    Readmission Risk Interventions No flowsheet  data found.

## 2021-08-27 NOTE — Care Management Obs Status (Signed)
[place] NOTIFICATION   Patient Details  Name: Misty Arnold MRN: [id number] Date of Birth: August 24, 1971   Medicare Observation Status Notification Given:  Yes    Bartholomew Crews, RN 08/27/2021, 3:19 PM

## 2021-08-27 NOTE — Consult Note (Signed)
[facility] Nurse Consult Note: Reason for Consult: Right breast mastectomy wound. Patient is followed in the outpatient setting by Dr. Heber [place] at the outpatient wound care center. Last seen in that office on 07/24/21. Wound type: Neoplastic Pressure Injury POA: NA Measurement:Per Dr. Heber [place] on 07/24/21:  14cm x 12cm with deepest depth measuring 2.5cm Wound bed: Red, yellow Drainage (amount, consistency, odor) : Large amber colored serous Periwound: with evidence of previous contraction, scarring Dressing procedure/placement/frequency: I will continue the POC implement and in place by Dr. Heber [place], ie., using a silver hydrofiber (Aquacel Ag+ Advantage, [place] # [phone]) to manage exudate and topping with ABD pads and securing with a breast binder. Dr. Heber [place] allows the patient to shower and wash the wound with soap and water and also uses a wound cleanser, which I do not have in house. On top of the ABD pads, Dr. Heber [place] also uses superabsorbent dressings/pads.  We do not carry these in house. Dressing changes are ordered for daily, but may also be performed PRN for drainage strike-through. If supplies are an issue at home, many patients use infant diapers to contain drainage and maintain skin integrity as they are effective and lower in cost than superabsorbent dressings/pads. I submit this only for consideration.  I recommend continuation of appointments with Dr. Heber [place] at the prescribed intervals.  [place] nursing team will not follow, but will remain available to this patient, the nursing and medical teams.  Please re-consult if needed. Thanks, Maudie Flakes, MSN, RN, [place], Arther Abbott  Pager# [phone]

## 2021-08-27 NOTE — Progress Notes (Signed)
PROGRESS NOTE    Misty Arnold  ZOX:[id number] DOB: 04-22-1971 DOA: 08/25/2021 PCP: Nicholas Lose, MD  Outpatient Specialists:   Brief Narrative:  Aspect H&P done by Dr. Karmen Bongo: "Misty Arnold is a 50 y.o. female with medical history significant of HTN and metastatic breast cancer to bone presenting with wound infection.  She seemed to be doing ok since her surgery.  Dr. Trenton Gammon said it would be 2 years before she could walk due to spinal mets and surgery.  The breast cancer just keeps coming back.  She had HHN but she has been taking care of the wound herself.  Dr. Lindi Adie referred her to wound care.  Wound care sent her all the supplies she needed the first time.  She required more supplies than were provided through Medicare.  2 weeks ago, she needed new supplies and the box came without everything needed.  When she took off the ABD pads it would stick to the wound which was causing bleeding and irritation.  She tried to get more supplies without success - Medicare wouldn't provide the supplies.  She started noticing a color change in her fluid and decided to come to the ER.  The fluid amount is increasing, needs to change bandages q5h.  It was previously light yellow and is now light green.  Dr. Geralyn Flash nurse recommended that she come to the ER.  She has not had pain meds since 5pm yesterday.   Within the last year, she is having increasing pain.  They have told her she shouldn't wait so long.  There is some cancer in her rib and back and this makes the pain worse at night.  This is year 57 of her cancer.  In 2011, she had stage 0 and had a mastectomy.  She does a lot of natural treatments and thinks she could have reversed it; she now regrets the mastectomy.  She has had multiple surgeries.  In 2016, she had a TRAM procedure.  When it recurred there was no muscle left and it has been open since 2019.  Her back surgery was in 01/2020 with a tumor resection.  She is walking with a cane, was  out of the wheelchair in 4 months after rigorous PT.  The pain takes a lot of her energy and she feels weaker now.  She wakes up in the middle of the night with pain.  She has never done IV chemo but takes monthly Zoladex and Letrozole and Ibrance daily.  It has been working but if she is a little stressed nothing works - as long as she keeps the stress at [place] the cancer gets better.  She took 3 weeks of back radiation, refused other.       ED Course: Carryover, per Dr. Marlowe Sax:   50 year old with metastatic breast cancer --> infected fungating chest wall mass and was sent to be admitted for IV antibiotics and pain control by oncology.  Slightly tachycardic.  WBC 2.4, lactate normal.  Patient was given vancomycin".  08/27/2021: Patient seen.  Available records reviewed.  Patient has no new complaints.  We will continue wound care.  Depending on hospital course, may consult the surgical team if debridement as needed..   Assessment & Plan:   Principal Problem:   Infected wound Active Problems:   Breast cancer of upper-outer quadrant of right female breast ([place])   Metastasis of neoplasm to spinal canal [facility])   Essential hypertension   Infected wound -Patient with chest wall chronic  wound associated with malignancy -Patient is currently on treatment for superimposed infection.   -Patient is currently on IV vancomycin, IV cefepime and metronidazole gel. -Wound care team has been consulted. -Patient is immunocompromised -We will have a low threshold to consult the surgical team if debridement is needed.     Metastatic breast cancer -She refused radiation treatment but did undergo limited palliative rads tx after spinal meds -She is currently on Zoladex, Ibrance, and Letrozole -She does not appear to recognize the severity of her condition -Palliative care input is appreciated.   -She is a full code at this time -Optimize pain control.   HTN -Controlled on Norvasc.    DVT  prophylaxis: Subcutaneous Lovenox Code Status: Full code Family Communication:  Disposition Plan: Home eventually   Consultants:  Have a low threshold to consult the surgical team if debridement is needed  Procedures:  None  Antimicrobials:  IV vancomycin IV cefepime Metronidazole gel   Subjective: No new complaints. No fever or chills  Objective: Vitals:   08/27/21 0535 08/27/21 0928 08/27/21 1259 08/27/21 1743  BP: 121/83 130/84 123/79 (!) 136/93  Pulse: 89 96 100 (!) 110  Resp: 18 17 18 18   Temp: (!) 97.5 F (36.4 C) 97.9 F (36.6 C) 97.7 F (36.5 C) 97.6 F (36.4 C)  TempSrc: Oral Oral Axillary Axillary  SpO2: 99% 100% 100% 100%  Weight:      Height:        Intake/Output Summary (Last 24 hours) at 08/27/2021 1959 Last data filed at 08/27/2021 1500 Gross per 24 hour  Intake 2221.2 ml  Output --  Net 2221.2 ml   Filed Weights   08/25/21 2157  Weight: 60 kg    Examination: General exam: Appears calm and comfortable.  Patient is not in any distress. Respiratory system: Clear to auscultation.  Cardiovascular system: S1 & S2 heard Gastrointestinal system: Abdomen is soft and nontender.   Central nervous system: Alert and oriented.  Patient moves all extremities.   Extremities: No leg edema.  r. Skin:  s/p R mastectomy with necrotic mass extending along R chest wall, foul-smelling significant drainage from chest wall ulceration      Data Reviewed: I have personally reviewed following labs and imaging studies  CBC: Recent Labs  Lab 08/25/21 2236 08/27/21 0100  WBC 2.4* 2.2*  NEUTROABS 1.3*  --   HGB 11.1* 10.8*  HCT 33.0* 32.3*  MCV 97.6 96.4  PLT 279 884   Basic Metabolic Panel: Recent Labs  Lab 08/25/21 2236 08/27/21 0100  NA 138 137  K 3.6 3.7  CL 103 103  CO2 25 27  GLUCOSE 109* 125*  BUN 14 9  CREATININE 1.00 0.97  CALCIUM 8.9 9.1   GFR: Estimated Creatinine Clearance: 61 mL/min (by C-G formula based on SCr of 0.97  mg/dL). Liver Function Tests: No results for input(s): AST, ALT, ALKPHOS, BILITOT, PROT, ALBUMIN in the last 168 hours. No results for input(s): LIPASE, AMYLASE in the last 168 hours. No results for input(s): AMMONIA in the last 168 hours. Coagulation Profile: No results for input(s): INR, PROTIME in the last 168 hours. Cardiac Enzymes: No results for input(s): CKTOTAL, CKMB, CKMBINDEX, TROPONINI in the last 168 hours. BNP (last 3 results) No results for input(s): PROBNP in the last 8760 hours. HbA1C: No results for input(s): HGBA1C in the last 72 hours. CBG: No results for input(s): GLUCAP in the last 168 hours. Lipid Profile: No results for input(s): CHOL, HDL, LDLCALC, TRIG, CHOLHDL, LDLDIRECT in  the last 72 hours. Thyroid Function Tests: No results for input(s): TSH, T4TOTAL, FREET4, T3FREE, THYROIDAB in the last 72 hours. Anemia Panel: No results for input(s): VITAMINB12, FOLATE, FERRITIN, TIBC, IRON, RETICCTPCT in the last 72 hours. Urine analysis:    Component Value Date/Time   COLORURINE YELLOW 05/11/2021 2323   APPEARANCEUR CLEAR 05/11/2021 2323   LABSPEC 1.020 05/11/2021 2323   PHURINE 5.0 05/11/2021 2323   GLUCOSEU NEGATIVE 05/11/2021 2323   HGBUR NEGATIVE 05/11/2021 2323   BILIRUBINUR NEGATIVE 05/11/2021 2323   KETONESUR 5 (A) 05/11/2021 2323   PROTEINUR NEGATIVE 05/11/2021 2323   NITRITE NEGATIVE 05/11/2021 2323   LEUKOCYTESUR NEGATIVE 05/11/2021 2323   Sepsis Labs: @LABRCNTIP (procalcitonin:4,lacticidven:4)  ) Recent Results (from the past 240 hour(s))  Resp Panel by RT-PCR (Flu A&B, Covid) Nasopharyngeal Swab     Status: None   Collection Time: 08/26/21  8:10 AM   Specimen: Nasopharyngeal Swab; Nasopharyngeal(NP) swabs in vial transport medium  Result Value Ref Range Status   SARS Coronavirus 2 by RT PCR NEGATIVE NEGATIVE Final    Comment: (NOTE) SARS-CoV-2 target nucleic acids are NOT DETECTED.  The SARS-CoV-2 RNA is generally detectable in upper  respiratory specimens during the acute phase of infection. The lowest concentration of SARS-CoV-2 viral copies this assay can detect is 138 copies/mL. A negative result does not preclude SARS-Cov-2 infection and should not be used as the sole basis for treatment or other patient management decisions. A negative result may occur with  improper specimen collection/handling, submission of specimen other than nasopharyngeal swab, presence of viral mutation(s) within the areas targeted by this assay, and inadequate number of viral copies(<138 copies/mL). A negative result must be combined with clinical observations, patient history, and epidemiological information. The expected result is Negative.  Fact Sheet for Patients:  EntrepreneurPulse.com.au  Fact Sheet for Healthcare Providers:  IncredibleEmployment.be  This test is no t yet approved or cleared by the [place] FDA and  has been authorized for detection and/or diagnosis of SARS-CoV-2 by FDA under an Emergency Use Authorization (EUA). This EUA will remain  in effect (meaning this test can be used) for the duration of the COVID-19 declaration under Section 564(b)(1) of the Act, 21 U.S.C.section 360bbb-3(b)(1), unless the authorization is terminated  or revoked sooner.       Influenza A by PCR NEGATIVE NEGATIVE Final   Influenza B by PCR NEGATIVE NEGATIVE Final    Comment: (NOTE) The Xpert Xpress SARS-CoV-2/FLU/RSV plus assay is intended as an aid in the diagnosis of influenza from Nasopharyngeal swab specimens and should not be used as a sole basis for treatment. Nasal washings and aspirates are unacceptable for Xpert Xpress SARS-CoV-2/FLU/RSV testing.  Fact Sheet for Patients: EntrepreneurPulse.com.au  Fact Sheet for Healthcare Providers: IncredibleEmployment.be  This test is not yet approved or cleared by the [place] FDA and has been  authorized for detection and/or diagnosis of SARS-CoV-2 by FDA under an Emergency Use Authorization (EUA). This EUA will remain in effect (meaning this test can be used) for the duration of the COVID-19 declaration under Section 564(b)(1) of the Act, 21 U.S.C. section 360bbb-3(b)(1), unless the authorization is terminated or revoked.  Performed at [facility], [place] [address] [place], [place] [address]          Radiology Studies: No results found.      Scheduled Meds:  amLODipine  10 mg Oral Daily   vitamin C  1,000 mg Oral Daily   calcium-vitamin D  1 tablet Oral BID WC  cholecalciferol  5,000 Units Oral BID   docusate sodium  100 mg Oral BID   enoxaparin (LOVENOX) injection  40 mg Subcutaneous Q24H   letrozole  2.5 mg Oral Daily   metroNIDAZOLE   Topical Daily   oxyCODONE  10 mg Oral Q12H   palbociclib  125 mg Oral Daily   Continuous Infusions:  ceFEPime (MAXIPIME) IV 2 g (08/27/21 0954)   lactated ringers 75 mL/hr at 08/27/21 0630   vancomycin Stopped (08/27/21 0535)     LOS: 0 days    Time spent: 25 minutes    Dana Allan, MD  [facility] Pager #: [phone] 7PM-7AM contact night coverage as above

## 2021-08-27 NOTE — Consult Note (Addendum)
Palliative Medicine Inpatient Consult Note  Consulting Provider: Karmen Bongo, MD  Reason for consult:   Question Answer  Reason for Consult? breast cancer wound   HPI:  Per intake H&P --> Misty Arnold is a 50 y.o. female with medical history significant of HTN and metastatic breast cancer to bone presenting with wound infection.  She seemed to be doing ok since her surgery.  Dr. Trenton Gammon said it would be 2 years before she could walk due to spinal mets and surgery.  The breast cancer just keeps coming back.  She had HHN but she has been taking care of the wound herself.  Dr. Lindi Adie referred her to wound care.   Palliative care has been consulted for management of symptoms in the setting of metastatic breast cancer and a large exudative wound on her right chest from a necrotic mass.  Clinical Assessment/Goals of Care:  *Please note that this is a verbal dictation therefore any spelling or grammatical errors are due to the "[employer] [employer]" system interpretation.  I have reviewed medical records including EPIC notes, labs and imaging, received report from bedside RN, assessed the patient who is sitting up in bed in no acute distress.    I met with Misty Arnold to further discuss diagnosis prognosis, GOC, EOL wishes, disposition and options.   I introduced Palliative Medicine as specialized medical care for people living with serious illness. It focuses on providing relief from the symptoms and stress of a serious illness. The goal is to improve quality of life for both the patient and the family.  Misty Arnold shares with me that she is from [place], [place].  She is divorced and has a boyfriend.  She has no children.  She has a family who is very supportive of her inclusive of her mother, her grandmother, and her older brother.  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  She shares that she loved her job and unfortunately when her health started declining due to  cancer in 2011 she had to leave her job and since then has been on disability.  She does not have an overt faith basis.  Prior to admission [name] had been living with her mother.  She shares that unfortunately its been a very stressful time as her mother has recently suffered a stroke and her grandmother has suffered a heart attack.  Misty Arnold's brother helps care for her mother. Misty Arnold is mobile with a cane.  She can attend to all basic activities of daily living.  She no longer drives in the setting of spinal surgery.  A detailed discussion was had today regarding advanced directives, Chelsey think she completed these though she cannot be for certain.  We reviewed that we can create a new advanced directive while she is an inpatient.   Concepts specific to code status, artifical feeding and hydration, continued IV antibiotics and rehospitalization was had. Misty Arnold is very clear about her ongoing desire to live and except treatments as they are offered.  From an oncological perspective Misty Arnold shares that she had initially begun treatment for her breast cancer at [facility] [place] though she became quite just concerned when she was placed under anesthesia for removal of a small breast mass which when she woke up had not been removed.  She had changed care providers then and has since been receiving oncological care and support from Dr. Vernetta Honey.  From a treatment perspective she is presently receiving Zoladex with letrozole and  Ibrance. Misty Arnold is also a utilizing holistic modalities of care for her cancer.  She has been to [place] for 6 months and utilize cannabis oil.  She shares the importance of detoxifying your system and staying away from acidic foods.  From a symptom perspective Misty Arnold shares:  1.)  Consistent pain in her back this is remedied in her home environment with oxycodone which does help.  Since hospitalized she has been rotating oxycodone and IV morphine.   She shares that she is never been on a long-acting opioid.  2.)  From an anxiety/stress perspective Misty Arnold tries to utilize the sunshine and other management modalities for stress.   3.)  From the perspective of constipation Misty Arnold does not suffer from this as her clean diet allows for regular bowel movements.  She also utilizes coffee enemas which help promote peristalsis.  4.) Wound care, patient was supposed to follow-up at an outpatient wound clinic.  Has been providing herself wound care regularly.Wound care will evaluate while hospitalized.  5.)  Muscular weakness and deconditioning is managed through physical therapy in the home.  6.)  Social stressors such as Misty Arnold's grandmother and mother's failing health are hard to contend with. Misty Arnold has endorsed in the past feeling down and at 1 point was going to initiate on antidepressants though she decided to identify other ways to handle her depression. Misty Arnold does go to a support group though she shares its not very robust as her town is very small. Misty Arnold would like to live closer to the [place] area as she receives her medical care here in the drive of an hour and 40 minutes from [place] can be very cumbersome on her family.  We discussed having the social work team help work with her to identify if there is anything from that perspective that could be done.  7.)  From a nutritional standpoint when appetite is low Misty Arnold takes dexamethasone.  8.) Muscle spasms Misty Arnold utilizes Valium though, infrequently.   Misty Arnold is very much interested in our outpatient palliative care oncology clinic to continue with symptomatic support.  I shared that I will make referral this evening.  Discussed the importance of continued conversation with family and their  medical providers regarding overall plan of care and treatment options, ensuring decisions are within the context of the patients values and GOCs.  Decision  Maker: Misty Arnold can make decisions for herself at this time.  SUMMARY OF RECOMMENDATIONS   Full Code/ Full Scope of Care  Continue current measures to support care  Referral to OP Palliative Oncology clinic - Have messaged Athena Cousar to make aware  Referral for anxiety cognitive behavioral therapy on discharge - will refer to psychologist Elias Else [phone]  Symptom needs as below  Ongoing PMT support, I will not be present tomorrow though I will request my colleague, Wadie Lessen check in on [place]: FULL CODE  Symptom Management:  Generalized Pain from spine mets: - Patient has had 57 oral morphine equivalents in the last 12 hours - complains of pain waking her at night. Will provide low dose oxycontin to identify if symptoms are better managed - Continue oxycodone 69m PO Q3H PRN - Continue morphine 252mIV Q2H PRN   Anxiety & Stress: - Would benefit from cognitive behavioral therapy as does not wish to take medications for this. Will refer for cognitive behavioral therapy. - Patient utilizes aromatherapy oils and detoxifying foot therapy on her own  Bowel Regularity: - Continue Miralax PRN -  Patient will continue coffee enemas on DC  Wound Care: - Appreciate wound care consult  - metronidazole 0.75 % gel daily x 14 days total  Muscular Weakness: - PT/OT  Nutrition: - Dietary consult - Receiving decadron 48m PO PRN  Muscle Spasms: - Continue valium 559mPO Q12H PRN  Social: - Patient is interested in moving closer to her doctors but lives on a fixed income. Appreciate MSW to help navigate the possibilities.  Emotional Support: - Patient is a member of a support group though it is limited in her small town - Will provide additional resources for support groups of woman contending with breast cancer  Advance Care Planning: - Appreciate Chaplain to help complete advance directives   Palliative Prophylaxis:  Oral  care, Mobility  Additional Recommendations (Limitations, Scope, Preferences): Full supportive care   Psycho-social/Spiritual:  Desire for further Chaplaincy support: No Additional Recommendations: Review of Palliative role in cancer patients. Education on symptom management.    Prognosis: Unclear, had has 2 IP admissions in the past 6 months.   Discharge Planning: Discharge will be to home when medically optimized.   Vitals:   08/27/21 0928 08/27/21 1259  BP: 130/84 123/79  Pulse: 96 100  Resp: 17 18  Temp: 97.9 F (36.6 C) 97.7 F (36.5 C)  SpO2: 100% 100%    Intake/Output Summary (Last 24 hours) at 08/27/2021 1359 Last data filed at 08/27/2021 0900 Gross per 24 hour  Intake 1342.02 ml  Output --  Net 1342.02 ml   Last Weight  Most recent update: 08/25/2021  9:57 PM    Weight  60 kg (132 lb 4.4 oz)            Gen:  Middle aged AA F in NAD HEENT: moist mucous membranes CV: Regular rate and rhythm  PULM: On RA, breathing is even and non-labored ABD: (+) large wound R chest extending to abdomen, covered with dressing EXT: No edema  Neuro: Alert and oriented x3   PPS: 60%   This conversation/these recommendations were discussed with patient primary care team, Dr. OgMarthenia RollingTime In: 1545 Time Out: 1716 Total Time: 91 minutes Greater than 50%  of this time was spent counseling and coordinating care related to the above assessment and plan.  [place] Team Cell Phone: [phone] utilize secure chat with additional questions, if there is no response within 30 minutes please call the above phone number  Palliative Medicine Team providers are available by phone from 7am to 7pm daily and can be reached through the team cell phone.  Should this patient require assistance outside of these hours, please call the patient's attending physician.

## 2021-08-27 NOTE — Progress Notes (Signed)
Wound dressing changed this evening with reduced odor noted. Metronidazole ointment applied all over wound and covered with petroleum gauge, NA dressing and covered with ABD pads to contain the drainage

## 2021-08-28 ENCOUNTER — Ambulatory Visit: Payer: Medicare PPO

## 2021-08-28 ENCOUNTER — Ambulatory Visit: Payer: Medicare PPO | Admitting: Hematology and Oncology

## 2021-08-28 ENCOUNTER — Other Ambulatory Visit: Payer: Medicare PPO

## 2021-08-28 DIAGNOSIS — Z823 Family history of stroke: Secondary | ICD-10-CM | POA: Diagnosis not present

## 2021-08-28 DIAGNOSIS — Z20822 Contact with and (suspected) exposure to covid-19: Secondary | ICD-10-CM | POA: Diagnosis present

## 2021-08-28 DIAGNOSIS — Z28310 Unvaccinated for covid-19: Secondary | ICD-10-CM | POA: Diagnosis not present

## 2021-08-28 DIAGNOSIS — Z79899 Other long term (current) drug therapy: Secondary | ICD-10-CM | POA: Diagnosis not present

## 2021-08-28 DIAGNOSIS — L089 Local infection of the skin and subcutaneous tissue, unspecified: Secondary | ICD-10-CM

## 2021-08-28 DIAGNOSIS — Z853 Personal history of malignant neoplasm of breast: Secondary | ICD-10-CM | POA: Diagnosis not present

## 2021-08-28 DIAGNOSIS — Y838 Other surgical procedures as the cause of abnormal reaction of the patient, or of later complication, without mention of misadventure at the time of the procedure: Secondary | ICD-10-CM | POA: Diagnosis present

## 2021-08-28 DIAGNOSIS — C7951 Secondary malignant neoplasm of bone: Secondary | ICD-10-CM | POA: Diagnosis present

## 2021-08-28 DIAGNOSIS — Z5320 Procedure and treatment not carried out because of patient's decision for unspecified reasons: Secondary | ICD-10-CM | POA: Diagnosis present

## 2021-08-28 DIAGNOSIS — Z515 Encounter for palliative care: Secondary | ICD-10-CM | POA: Diagnosis not present

## 2021-08-28 DIAGNOSIS — T148XXA Other injury of unspecified body region, initial encounter: Secondary | ICD-10-CM | POA: Diagnosis not present

## 2021-08-28 DIAGNOSIS — C50919 Malignant neoplasm of unspecified site of unspecified female breast: Secondary | ICD-10-CM

## 2021-08-28 DIAGNOSIS — D849 Immunodeficiency, unspecified: Secondary | ICD-10-CM | POA: Diagnosis present

## 2021-08-28 DIAGNOSIS — Z800 Family history of malignant neoplasm of digestive organs: Secondary | ICD-10-CM | POA: Diagnosis not present

## 2021-08-28 DIAGNOSIS — S21009A Unspecified open wound of unspecified breast, initial encounter: Secondary | ICD-10-CM | POA: Diagnosis present

## 2021-08-28 DIAGNOSIS — Z833 Family history of diabetes mellitus: Secondary | ICD-10-CM | POA: Diagnosis not present

## 2021-08-28 DIAGNOSIS — Z8249 Family history of ischemic heart disease and other diseases of the circulatory system: Secondary | ICD-10-CM | POA: Diagnosis not present

## 2021-08-28 DIAGNOSIS — C50411 Malignant neoplasm of upper-outer quadrant of right female breast: Secondary | ICD-10-CM | POA: Diagnosis present

## 2021-08-28 DIAGNOSIS — G4700 Insomnia, unspecified: Secondary | ICD-10-CM | POA: Diagnosis present

## 2021-08-28 DIAGNOSIS — G9520 Unspecified cord compression: Secondary | ICD-10-CM | POA: Diagnosis present

## 2021-08-28 DIAGNOSIS — Z83438 Family history of other disorder of lipoprotein metabolism and other lipidemia: Secondary | ICD-10-CM | POA: Diagnosis not present

## 2021-08-28 DIAGNOSIS — Z803 Family history of malignant neoplasm of breast: Secondary | ICD-10-CM | POA: Diagnosis not present

## 2021-08-28 DIAGNOSIS — Z7189 Other specified counseling: Secondary | ICD-10-CM | POA: Diagnosis not present

## 2021-08-28 DIAGNOSIS — G893 Neoplasm related pain (acute) (chronic): Secondary | ICD-10-CM | POA: Diagnosis present

## 2021-08-28 DIAGNOSIS — F419 Anxiety disorder, unspecified: Secondary | ICD-10-CM | POA: Diagnosis present

## 2021-08-28 DIAGNOSIS — Z9011 Acquired absence of right breast and nipple: Secondary | ICD-10-CM | POA: Diagnosis not present

## 2021-08-28 DIAGNOSIS — I10 Essential (primary) hypertension: Secondary | ICD-10-CM | POA: Diagnosis present

## 2021-08-28 DIAGNOSIS — Z79811 Long term (current) use of aromatase inhibitors: Secondary | ICD-10-CM | POA: Diagnosis not present

## 2021-08-28 DIAGNOSIS — E876 Hypokalemia: Secondary | ICD-10-CM | POA: Diagnosis not present

## 2021-08-28 DIAGNOSIS — T8142XA Infection following a procedure, deep incisional surgical site, initial encounter: Secondary | ICD-10-CM | POA: Diagnosis present

## 2021-08-28 DIAGNOSIS — M62838 Other muscle spasm: Secondary | ICD-10-CM | POA: Diagnosis not present

## 2021-08-28 MED ORDER — BOOST / RESOURCE BREEZE PO LIQD CUSTOM
1.0000 | Freq: Three times a day (TID) | ORAL | Status: DC
  Administered 2021-08-29 – 2021-09-05 (×21): 1 via ORAL

## 2021-08-28 MED ORDER — SODIUM CHLORIDE 0.9 % IV SOLN
2.0000 g | Freq: Three times a day (TID) | INTRAVENOUS | Status: DC
  Administered 2021-08-28 – 2021-08-29 (×3): 2 g via INTRAVENOUS
  Filled 2021-08-28 (×2): qty 2

## 2021-08-28 MED ORDER — PROSOURCE PLUS PO LIQD
30.0000 mL | Freq: Three times a day (TID) | ORAL | Status: DC
  Administered 2021-08-28 – 2021-09-05 (×25): 30 mL via ORAL
  Filled 2021-08-28 (×25): qty 30

## 2021-08-28 MED ORDER — OXYCODONE HCL ER 15 MG PO T12A
15.0000 mg | EXTENDED_RELEASE_TABLET | Freq: Two times a day (BID) | ORAL | Status: DC
Start: 2021-08-28 — End: 2021-09-03
  Administered 2021-08-28 – 2021-09-02 (×11): 15 mg via ORAL
  Filled 2021-08-28 (×11): qty 1

## 2021-08-28 MED ORDER — MORPHINE SULFATE (PF) 2 MG/ML IV SOLN
2.0000 mg | INTRAVENOUS | Status: DC | PRN
  Administered 2021-08-29 – 2021-09-05 (×10): 2 mg via INTRAVENOUS
  Filled 2021-08-28 (×10): qty 1

## 2021-08-28 MED ORDER — ADULT MULTIVITAMIN W/MINERALS CH
1.0000 | ORAL_TABLET | Freq: Every day | ORAL | Status: DC
  Administered 2021-08-28 – 2021-09-05 (×9): 1 via ORAL
  Filled 2021-08-28 (×9): qty 1

## 2021-08-28 NOTE — Evaluation (Signed)
Occupational Therapy Evaluation Patient Details Name: Misty Arnold MRN: [id number] DOB: 03-Apr-1971 Today's Date: 08/28/2021   History of Present Illness Pt. is 50 yr old F admitted on 10/21 for infected R breast CA surgical wound. PMH: anemia, depression, HTN, breast CA x 10 yrs with mets to spine and ribs   Clinical Impression   Pt presents with pain and slight balance deficits. Pt exhibited safety and independence with ADLs and functional transfers/mobility during eval. Pt drives and reports she is at her baseline for occupational performance. Pt may benefit from [facility] nursing for would care, but should be safe to return home without any further skilled OT services once medically cleared. No further skilled OT needs at this time. Will sign off.     Recommendations for follow up therapy are one component of a multi-disciplinary discharge planning process, led by the attending physician.  Recommendations may be updated based on patient status, additional functional criteria and insurance authorization.   Follow Up Recommendations  No OT follow up    Assistance Recommended at Discharge Other (comment) (Pt may benefit from [facility] for wound care)  Functional Status Assessment  Patient has not had a recent decline in their functional status  Equipment Recommendations  None recommended by OT    Recommendations for Other Services PT consult     Precautions / Restrictions Precautions Precautions: None Restrictions Weight Bearing Restrictions: No      Mobility Bed Mobility Overal bed mobility: Independent                  Transfers Overall transfer level: Modified independent Equipment used: None               General transfer comment: Pt has quad cane that she uses for long distances and community mobility. Ambulates in room with IV pole during session.      Balance Overall balance assessment: Mild deficits observed, not formally tested                                          ADL either performed or assessed with clinical judgement   ADL Overall ADL's : Modified independent;At baseline                                       General ADL Comments: Pt demonstrated safety and independence with ADLs and functional transfers during eval. Pt reports that she is at her baseline for occupational performance.     Vision   Vision Assessment?: No apparent visual deficits     Perception     Praxis      Pertinent Vitals/Pain Pain Assessment: 0-10 Pain Score: 7  Pain Location: back Pain Descriptors / Indicators: Aching;Discomfort;Guarding;Sore Pain Intervention(s): Monitored during session;Repositioned     Hand Dominance Right   Extremity/Trunk Assessment Upper Extremity Assessment Upper Extremity Assessment: Overall WFL for tasks assessed   Lower Extremity Assessment Lower Extremity Assessment: Defer to PT evaluation       Communication Communication Communication: No difficulties   Cognition Arousal/Alertness: Awake/alert Behavior During Therapy: WFL for tasks assessed/performed Overall Cognitive Status: Within Functional Limits for tasks assessed  General Comments       Exercises     Shoulder Instructions      Home Living Family/patient expects to be discharged to:: Private residence Living Arrangements: Spouse/significant other;Parent Available Help at Discharge: Available 24 hours/day Type of Home: House Home Access: Stairs to enter [employer] of Steps: 2 Entrance Stairs-Rails: None Home Layout: One level     Bathroom Shower/Tub: Teacher, early years/pre: Standard     Home Equipment: Conservation officer, nature (2 wheels);Cane - quad;BSC;Shower seat;Grab bars - tub/shower;Toilet riser;Wheelchair - manual          Prior Functioning/Environment Prior Level of Function : Independent/Modified Independent;Driving                         OT Problem List: Impaired balance (sitting and/or standing);Pain      OT Treatment/Interventions:      OT Goals(Current goals can be found in the care plan section) Acute Rehab OT Goals Patient Stated Goal: return home OT Goal Formulation: With patient  OT Frequency:     Barriers to D/C:            Co-evaluation              AM-PAC OT "6 Clicks" Daily Activity     Outcome Measure Help from another person eating meals?: None Help from another person taking care of personal grooming?: None Help from another person toileting, which includes using toliet, bedpan, or urinal?: None Help from another person bathing (including washing, rinsing, drying)?: None Help from another person to put on and taking off regular upper body clothing?: None Help from another person to put on and taking off regular lower body clothing?: None 6 Click Score: 24   End of Session Nurse Communication: Mobility status  Activity Tolerance: Patient tolerated treatment well Patient left: in chair;with call bell/phone within reach  OT Visit Diagnosis: Unsteadiness on feet (R26.81);Pain                Time: 6195-0932 OT Time Calculation (min): 21 min Charges:  OT General Charges $OT Visit: 1 Visit OT Evaluation $OT Eval Low Complexity: 1 Low  Shamond Skelton C, OT/L  Acute Rehab [place] 08/28/2021, 9:58 AM

## 2021-08-28 NOTE — Progress Notes (Addendum)
Daily Progress Note   Patient Name: Misty Arnold       Date: 08/28/2021 DOB: 02-06-1971  Age: 50 y.o. MRN#: [id number] Attending Physician: Darliss Cheney, MD Primary Care Physician: Nicholas Lose, MD Admit Date: 08/25/2021  Reason for Consultation/Follow-up: Establishing goals of care and Pain control  Today's discussion  This nurse practitioner reviewed medical record, received report from team and then assessed patient at bedside along with her mother for continued conversation regarding symptom management secondary to her right chest wall wound/2/2 breast cancer.  Easy conversation today with Ms Leisinger as she shares her journey over the past 10 years since the diagnosis of her breast cancer. Therapeutic listening and emotional support.  She feels that the extended release oxycodone  did not "really hold" her pain under control.  Education offered on pain management strategies.    Will increase Oxycodone ER, strive to utilize oral agents in preparation for home care plan.  Encouraged patient to ask for and use medications as prescribed   Education on med adjustments and Ms jolee critcher understanding and is in agreement with plan.  F/u in the morning for efficacy   Length of Stay: 0  Current Medications: Scheduled Meds:  . amLODipine  10 mg Oral Daily  . vitamin C  1,000 mg Oral Daily  . calcium-vitamin D  1 tablet Oral BID WC  . cholecalciferol  5,000 Units Oral BID  . docusate sodium  100 mg Oral BID  . enoxaparin (LOVENOX) injection  40 mg Subcutaneous Q24H  . letrozole  2.5 mg Oral Daily  . metroNIDAZOLE   Topical Daily  . oxyCODONE  10 mg Oral Q12H  . palbociclib  125 mg Oral Daily    Continuous Infusions: . ceFEPime (MAXIPIME) IV 2 g (08/27/21 2125)  . lactated  ringers 75 mL/hr at 08/27/21 2128  . vancomycin 1,250 mg (08/28/21 0358)    PRN Meds: acetaminophen **OR** acetaminophen, bisacodyl, dexamethasone, diazepam, hydrALAZINE, morphine injection, ondansetron **OR** ondansetron (ZOFRAN) IV, oxyCODONE, polyethylene glycol  Physical Exam          Vital Signs: BP 122/77 (BP Location: Left Arm)   Pulse (!) 107   Temp 97.8 F (36.6 C) (Oral)   Resp 18   Ht 5\' 5"  (1.651 m)   Wt 60 kg   SpO2 99%   BMI  22.01 kg/m  SpO2: SpO2: 99 % O2 Device: O2 Device: Room Air O2 Flow Rate:    Intake/output summary:  Intake/Output Summary (Last 24 hours) at 08/28/2021 0939 Last data filed at 08/27/2021 1500 Gross per 24 hour  Intake 1159.92 ml  Output --  Net 1159.92 ml   LBM: Last BM Date: 08/25/21 Baseline Weight: Weight: 60 kg Most recent weight: Weight: 60 kg       Palliative Assessment/Data:      Patient Active Problem List   Diagnosis Date Noted  . Infected wound 08/26/2021  . Essential hypertension 08/26/2021  . Wound infection 05/12/2021  . Cancer related pain 05/12/2021  . AKI (acute kidney injury) ([place]) 05/11/2021  . Palliative care patient 02/15/2020  . Myelopathy ([place]) 01/08/2020  . Incomplete paraplegia ([place]) 01/08/2020  . Epidural mass   . Metastatic breast cancer ([place])   . Weakness of both legs   . Leucocytosis   . Acute blood loss anemia   . Neurogenic bladder   . Neurogenic bowel   . S/P laminectomy 01/04/2020  . 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 Breast cancer of upper-outer quadrant of right female breast ([place]) 11/02/2014    Palliative Care Assessment & Plan   Patient Profile: 50 y.o. female with medical history significant of HTN and metastatic breast cancer to bone presenting with wound infection.    Diagnosed in 2011 s/p right mastectomy and reconstruction 01/2011. Margins were positive but no metastatic disease at that time. Patient was offered chemotherapy but refused and went to  [place] for alternative treatments. In 2016 she underwent recurrent R mastectomy and attempted anti-estrogen therapy but stopped due to headache/HTN. She had lower extremity weakness due to metastatic tumor at T9 requiring decompression by neurosurgery. She has undergone palliative radiation and multiple forms of hormone therapy from 2021-2022 but has progressive disease including a large chest wall mass she is managing at home with wound care.  General surgery consult today for possible debridement of chest wall wound.   Patient has sought cancer care at various cancer centers and multiple oncologists over the past ten years.  She has incorporated alternative treatments into her care plan .  She speaks to eating and drinking alternative diet foods and drinks     Dr. Lindi Adie is her current oncologist   Palliative care has been consulted for management of symptoms in the setting of metastatic breast cancer and a large exudative wound on her right chest from a necrotic mass.  Recommendations/Plan: Pain management:       Increase Oxycodone ER 15 mg every 12 hours scheduled       Oxycodone IR , oral, 10 every 3 hours prn        Morphine 2 mg IV  every 4 hrs as needed ( always try oral agents first)--  Education offered on the importance of consideration and documentation of advance care planning documents.   Goals of Care and Additional Recommendations:  Full Code Continue cancer care under oncologist Dr Sonny Dandy [facility] Wound care with Dr [facility] [place]     Discharge Planning: Home when medically stable     Thank you for allowing the Palliative Medicine Team to assist in the care of this patient.   Time In:  1000 Time Out: 1045 Total Time 45 Prolonged Time Billed  no       Greater than 50%  of this time was spent counseling and coordinating care related to the  above assessment and plan.  Wadie Lessen, NP  Please contact Palliative Medicine Team phone at  [phone] for questions and concerns.

## 2021-08-28 NOTE — Progress Notes (Addendum)
PROGRESS NOTE    Svara Twyman  OZH:[id number] DOB: Jan 31, 1971 DOA: 08/25/2021 PCP: Nicholas Lose, MD  Outpatient Specialists:   Brief Narrative:  Aspect H&P done by Dr. Karmen Bongo: "Jerrica Thorman is a 50 y.o. female with medical history significant of HTN and metastatic breast cancer to bone presenting with wound infection.  She seemed to be doing ok since her surgery.  Dr. Trenton Gammon said it would be 2 years before she could walk due to spinal mets and surgery.  The breast cancer just keeps coming back.  She had HHN but she has been taking care of the wound herself.  Dr. Lindi Adie referred her to wound care.  Wound care sent her all the supplies she needed the first time.  She required more supplies than were provided through Medicare.  2 weeks ago, she needed new supplies and the box came without everything needed.  When she took off the ABD pads it would stick to the wound which was causing bleeding and irritation.  She tried to get more supplies without success - Medicare wouldn't provide the supplies.  She started noticing a color change in her fluid and decided to come to the ER.  The fluid amount is increasing, needs to change bandages q5h.  It was previously light yellow and is now light green.  Dr. Geralyn Flash nurse recommended that she come to the ER.  She has not had pain meds since 5pm yesterday.   Within the last year, she is having increasing pain.  They have told her she shouldn't wait so long.  There is some cancer in her rib and back and this makes the pain worse at night.  This is year 50 of her cancer.  In 2011, she had stage 0 and had a mastectomy.  She does a lot of natural treatments and thinks she could have reversed it; she now regrets the mastectomy.  She has had multiple surgeries.  In 2016, she had a TRAM procedure.  When it recurred there was no muscle left and it has been open since 2019.  Her back surgery was in 01/2020 with a tumor resection.  She is walking with a cane, was  out of the wheelchair in 4 months after rigorous PT.  The pain takes a lot of her energy and she feels weaker now.  She wakes up in the middle of the night with pain.  She has never done IV chemo but takes monthly Zoladex and Letrozole and Ibrance daily.  It has been working but if she is a little stressed nothing works - as long as she keeps the stress at [place] the cancer gets better.  She took 3 weeks of back radiation, refused other.       ED Course: [place], per Dr. Marlowe Sax:   50 year old with metastatic breast cancer --> infected fungating chest wall mass and was sent to be admitted for IV antibiotics and pain control by oncology.  Slightly tachycardic.  WBC 2.4, lactate normal.  Patient was given vancomycin".  08/27/2021: Patient seen.  Available records reviewed.  Patient has no new complaints.  We will continue wound care.  Depending on hospital course, may consult the surgical team if debridement as needed..   Assessment & Plan:   Principal Problem:   Infected wound Active Problems:   Breast cancer of upper-outer quadrant of right female breast ([place])   Metastasis of neoplasm to spinal canal [facility])   Essential hypertension   Infected wound of the right breast/chest wall -  Patient with chest wall chronic wound associated with malignancy -Patient is currently on IV vancomycin, IV cefepime and metronidazole gel. -Wound care team has been consulted. -Patient is immunocompromised.  I have consulted ID as well as general surgery as she may need some debridement.   Metastatic breast cancer -She refused radiation treatment but did undergo limited palliative rads tx after spinal meds -She is currently on Zoladex, Ibrance, and Letrozole -Palliative care input is appreciated.   -She is a full code at this time -Optimize pain control.   HTN -Controlled on Norvasc.    DVT prophylaxis: Subcutaneous Lovenox Code Status: Full code Family Communication: None.  Disposition Plan: Home  eventually   Consultants:  General surgery ID  Procedures:  None  Antimicrobials:  IV vancomycin IV cefepime Metronidazole gel   Subjective:  Patient seen and examined.  She tells me that she has noticed improvement in the drainage in her wound since she has been started on IV antibiotics.  No other complaint.  Objective: Vitals:   08/27/21 1743 08/27/21 2015 08/28/21 0351 08/28/21 0821  BP: (!) 136/93 116/83 (!) 143/82 122/77  Pulse: (!) 110 (!) 106 90 (!) 107  Resp: 18 18 18 18   Temp: 97.6 F (36.4 C) 98.4 F (36.9 C) 97.7 F (36.5 C) 97.8 F (36.6 C)  TempSrc: Axillary Oral Oral Oral  SpO2: 100% 100% 100% 99%  Weight:      Height:        Intake/Output Summary (Last 24 hours) at 08/28/2021 1142 Last data filed at 08/27/2021 1500 Gross per 24 hour  Intake 1159.92 ml  Output --  Net 1159.92 ml    Filed Weights   08/25/21 2157  Weight: 60 kg    Examination:  General exam: Appears calm and comfortable  Respiratory system: Clear to auscultation. Respiratory effort normal. Cardiovascular system: S1 & S2 heard, RRR. No JVD, murmurs, rubs, gallops or clicks. No pedal edema. Gastrointestinal system: Abdomen is nondistended, soft and nontender. No organomegaly or masses felt. Normal bowel sounds heard. Central nervous system: Alert and oriented. No focal neurological deficits. Extremities: Symmetric 5 x 5 power. Skin: Pictures below.   Psychiatry: Judgement and insight appear normal. Mood & affect appropriate.        Data Reviewed: I have personally reviewed following labs and imaging studies  CBC: Recent Labs  Lab 08/25/21 2236 08/27/21 0100  WBC 2.4* 2.2*  NEUTROABS 1.3*  --   HGB 11.1* 10.8*  HCT 33.0* 32.3*  MCV 97.6 96.4  PLT 279 379    Basic Metabolic Panel: Recent Labs  Lab 08/25/21 2236 08/27/21 0100  NA 138 137  K 3.6 3.7  CL 103 103  CO2 25 27  GLUCOSE 109* 125*  BUN 14 9  CREATININE 1.00 0.97  CALCIUM 8.9 9.1     GFR: Estimated Creatinine Clearance: 61 mL/min (by C-G formula based on SCr of 0.97 mg/dL). Liver Function Tests: No results for input(s): AST, ALT, ALKPHOS, BILITOT, PROT, ALBUMIN in the last 168 hours. No results for input(s): LIPASE, AMYLASE in the last 168 hours. No results for input(s): AMMONIA in the last 168 hours. Coagulation Profile: No results for input(s): INR, PROTIME in the last 168 hours. Cardiac Enzymes: No results for input(s): CKTOTAL, CKMB, CKMBINDEX, TROPONINI in the last 168 hours. BNP (last 3 results) No results for input(s): PROBNP in the last 8760 hours. HbA1C: No results for input(s): HGBA1C in the last 72 hours. CBG: No results for input(s): GLUCAP in the last 168  hours. Lipid Profile: No results for input(s): CHOL, HDL, LDLCALC, TRIG, CHOLHDL, LDLDIRECT in the last 72 hours. Thyroid Function Tests: No results for input(s): TSH, T4TOTAL, FREET4, T3FREE, THYROIDAB in the last 72 hours. Anemia Panel: No results for input(s): VITAMINB12, FOLATE, FERRITIN, TIBC, IRON, RETICCTPCT in the last 72 hours. Urine analysis:    Component Value Date/Time   COLORURINE YELLOW 05/11/2021 2323   APPEARANCEUR CLEAR 05/11/2021 2323   LABSPEC 1.020 05/11/2021 2323   PHURINE 5.0 05/11/2021 2323   GLUCOSEU NEGATIVE 05/11/2021 2323   HGBUR NEGATIVE 05/11/2021 2323   BILIRUBINUR NEGATIVE 05/11/2021 2323   KETONESUR 5 (A) 05/11/2021 2323   PROTEINUR NEGATIVE 05/11/2021 2323   NITRITE NEGATIVE 05/11/2021 2323   LEUKOCYTESUR NEGATIVE 05/11/2021 2323   Sepsis Labs: @LABRCNTIP (procalcitonin:4,lacticidven:4)  ) Recent Results (from the past 240 hour(s))  Resp Panel by RT-PCR (Flu A&B, Covid) Nasopharyngeal Swab     Status: None   Collection Time: 08/26/21  8:10 AM   Specimen: Nasopharyngeal Swab; Nasopharyngeal(NP) swabs in vial transport medium  Result Value Ref Range Status   SARS Coronavirus 2 by RT PCR NEGATIVE NEGATIVE Final    Comment: (NOTE) SARS-CoV-2 target  nucleic acids are NOT DETECTED.  The SARS-CoV-2 RNA is generally detectable in upper respiratory specimens during the acute phase of infection. The lowest concentration of SARS-CoV-2 viral copies this assay can detect is 138 copies/mL. A negative result does not preclude SARS-Cov-2 infection and should not be used as the sole basis for treatment or other patient management decisions. A negative result may occur with  improper specimen collection/handling, submission of specimen other than nasopharyngeal swab, presence of viral mutation(s) within the areas targeted by this assay, and inadequate number of viral copies(<138 copies/mL). A negative result must be combined with clinical observations, patient history, and epidemiological information. The expected result is Negative.  Fact Sheet for Patients:  EntrepreneurPulse.com.au  Fact Sheet for Healthcare Providers:  IncredibleEmployment.be  This test is no t yet approved or cleared by the [place] FDA and  has been authorized for detection and/or diagnosis of SARS-CoV-2 by FDA under an Emergency Use Authorization (EUA). This EUA will remain  in effect (meaning this test can be used) for the duration of the COVID-19 declaration under Section 564(b)(1) of the Act, 21 U.S.C.section 360bbb-3(b)(1), unless the authorization is terminated  or revoked sooner.       Influenza A by PCR NEGATIVE NEGATIVE Final   Influenza B by PCR NEGATIVE NEGATIVE Final    Comment: (NOTE) The Xpert Xpress SARS-CoV-2/FLU/RSV plus assay is intended as an aid in the diagnosis of influenza from Nasopharyngeal swab specimens and should not be used as a sole basis for treatment. Nasal washings and aspirates are unacceptable for Xpert Xpress SARS-CoV-2/FLU/RSV testing.  Fact Sheet for Patients: EntrepreneurPulse.com.au  Fact Sheet for Healthcare  Providers: IncredibleEmployment.be  This test is not yet approved or cleared by the [place] FDA and has been authorized for detection and/or diagnosis of SARS-CoV-2 by FDA under an Emergency Use Authorization (EUA). This EUA will remain in effect (meaning this test can be used) for the duration of the COVID-19 declaration under Section 564(b)(1) of the Act, 21 U.S.C. section 360bbb-3(b)(1), unless the authorization is terminated or revoked.  Performed at [facility], [place] [address] [place], [place] [address]     Radiology Studies: No results found.   Scheduled Meds:  amLODipine  10 mg Oral Daily   vitamin C  1,000 mg Oral Daily   calcium-vitamin D  1 tablet Oral BID WC   cholecalciferol  5,000 Units Oral BID   docusate sodium  100 mg Oral BID   enoxaparin (LOVENOX) injection  40 mg Subcutaneous Q24H   letrozole  2.5 mg Oral Daily   metroNIDAZOLE   Topical Daily   oxyCODONE  10 mg Oral Q12H   palbociclib  125 mg Oral Daily   Continuous Infusions:  ceFEPime (MAXIPIME) IV 2 g (08/28/21 1000)   lactated ringers 75 mL/hr at 08/27/21 2128   vancomycin 1,250 mg (08/28/21 0358)     LOS: 0 days   Time spent: 35 minutes Darliss Cheney, MD  [facility] 7PM-7AM contact night coverage as above

## 2021-08-28 NOTE — Consult Note (Signed)
[facility]  Total days of antibiotics 4               Reason for Consult: chest wall wound   Referring Physician: pahwani  Principal Problem:   Infected wound Active Problems:   Breast cancer of upper-outer quadrant of right female breast ([place])   Metastasis of neoplasm to spinal canal [facility])   Essential hypertension   Breast wound    HPI: Misty Arnold is a 50 y.o. 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 She has not had any recent abtx in the past year. She has been using silver algenate type of dressing to absorb worsening drainage. She denies it being purulent or worsening redness to chest wall. She states that in the last week, the home health agency did not deliver her typical dressing and only had abdominal pads to absorb dressing. She noticed increasing drainage and greenish tint to drainage which brought her in for evaluation. She denies fevers, rigors,   Surgery evaluated patient and felt that not a candidate for debridement but wound is likely residual from tumor necrosis. See admit pictures  She was initially started on vanco/cefepime and seen by wound care. She did not have fevers. Wbc, leukopenia but stable.  Past Medical History:  Diagnosis Date   Abnormal uterine bleeding    Anemia    Breast cancer ([place])    Depression    Fibroid    Hormone disorder    Hypertension    Infertility, female     Allergies:  Allergies  Allergen Reactions   Amoxicillin     Other reaction(s): rash/itching   Penicillins Rash    Did it involve swelling of the face/tongue/throat, SOB, or low BP? no Did it involve sudden or severe rash/hives, skin peeling, or any  reaction on the inside of your mouth or nose? Yes Did you need to seek medical attention at a hospital or doctor's office? No When did it last happen? early 20's     If all above answers are "NO", may proceed with cephalosporin use.   Pork-Derived Products    Penicillin G    Tamoxifen Hypertension    MEDICATIONS:  (feeding supplement) PROSource Plus  30 mL Oral TID BM   amLODipine  10 mg Oral Daily   vitamin C  1,000 mg Oral Daily   calcium-vitamin D  1 tablet Oral BID WC   cholecalciferol  5,000 Units Oral BID   docusate sodium  100 mg Oral BID   enoxaparin (LOVENOX) injection  40 mg Subcutaneous Q24H   feeding supplement  1 Container Oral TID BM   letrozole  2.5 mg Oral Daily   metroNIDAZOLE   Topical Daily   multivitamin with minerals  1 tablet Oral Daily   oxyCODONE  15 mg Oral Q12H   palbociclib  125 mg Oral Daily    Social History   Tobacco Use   Smoking status: Never   Smokeless tobacco: Never  Vaping Use   Vaping Use: Never used  Substance Use Topics   Alcohol use: Not Currently    Comment: rare   Drug use: No    Family History  Problem Relation Age of Onset   Stroke Mother  Heart attack Mother    Diabetes Mother    Hyperlipidemia Mother    Heart disease Mother    Diabetes Father    Stroke Father    Stroke Maternal Grandmother    Cancer Maternal Grandfather    Colon cancer Maternal Grandfather    Stroke Maternal Grandfather    Stroke Paternal Grandmother    Heart attack Paternal Grandmother    Heart failure Paternal Grandmother    Diabetes Paternal Grandmother    Hyperlipidemia Paternal Grandmother    CAD Paternal Grandmother    Cancer Paternal Grandfather    Colon cancer Paternal Grandfather    Cancer Maternal Aunt 27       breast   Stroke Maternal Aunt    Heart attack Maternal Aunt    Breast cancer Maternal Aunt      Review of Systems  Constitutional: +fatigue. Negative for fever, chills, diaphoresis, activity change, appetite change,   and unexpected weight change.  HENT: Negative for congestion, sore throat, rhinorrhea, sneezing, trouble swallowing and sinus pressure.  Eyes: Negative for photophobia and visual disturbance.  Respiratory: Negative for cough, chest tightness, shortness of breath, wheezing and stridor.  Cardiovascular: Negative for chest pain, palpitations and leg swelling.  Gastrointestinal: Negative for nausea, vomiting, abdominal pain, diarrhea, constipation, blood in stool, abdominal distention and anal bleeding.  Genitourinary: Negative for dysuria, hematuria, flank pain and difficulty urinating.  Musculoskeletal: Negative for myalgias, back pain, joint swelling, arthralgias and gait problem.  Skin: Negative for color change, pallor, rash and wound.  Neurological: Negative for dizziness, tremors, weakness and light-headedness.  Hematological: Negative for adenopathy. Does not bruise/bleed easily.  Psychiatric/Behavioral: Negative for behavioral problems, confusion, sleep disturbance, dysphoric mood, decreased concentration and agitation.    OBJECTIVE: Temp:  [97.7 F (36.5 C)-98.4 F (36.9 C)] 97.8 F (36.6 C) (10/24 1724) Pulse Rate:  [90-107] 102 (10/24 1724) Resp:  [18] 18 (10/24 1724) BP: (113-143)/(75-83) 113/75 (10/24 1724) SpO2:  [98 %-100 %] 98 % (10/24 1724) Physical Exam  Constitutional:  oriented to person, place, and time. appears well-developed and well-nourished. No distress.  HENT: [place]/AT, PERRLA, no scleral icterus Mouth/Throat: Oropharynx is clear and moist. No oropharyngeal exudate.  Cardiovascular: Normal rate, regular rhythm and normal heart sounds. Exam reveals no gallop and no friction rub.  No murmur heard.  Pulmonary/Chest: Effort normal and breath sounds normal. No respiratory distress.  has no wheezes.  Neck = supple, no nuchal rigidity Chest wall = large 15cm wound with yellowish drainage, soaked bandages Abdominal: Soft. Bowel sounds are normal.  exhibits no distension.  There is no tenderness.  Lymphadenopathy: no cervical adenopathy. No axillary adenopathy Neurological: alert and oriented to person, place, and time.  Skin: Skin is warm and dry. No rash noted. No erythema.  Psychiatric: a normal mood and affect.  behavior is normal.    LABS: Results for orders placed or performed during the hospital encounter of 08/25/21 (from the past 48 hour(s))  Basic metabolic panel     Status: Abnormal   Collection Time: 08/27/21  1:00 AM  Result Value Ref Range   Sodium 137 135 - 145 mmol/L   Potassium 3.7 3.5 - 5.1 mmol/L   Chloride 103 98 - 111 mmol/L   CO2 27 22 - 32 mmol/L   Glucose, Bld 125 (H) 70 - 99 mg/dL    Comment: Glucose reference range applies only to samples taken after fasting for at least 8 hours.   BUN 9 6 - 20 mg/dL   Creatinine, Ser  0.97 0.44 - 1.00 mg/dL   Calcium 9.1 8.9 - 10.3 mg/dL   GFR, Estimated >60 >60 mL/min    Comment: (NOTE) Calculated using the CKD-EPI Creatinine Equation (2021)    Anion gap 7 5 - 15    Comment: Performed at [facility] [address] [place], [place] [address]  CBC     Status: Abnormal   Collection Time: 08/27/21  1:00 AM  Result Value Ref Range   WBC 2.2 (L) 4.0 - 10.5 K/uL   RBC 3.35 (L) 3.87 - 5.11 MIL/uL   Hemoglobin 10.8 (L) 12.0 - 15.0 g/dL   HCT 32.3 (L) 36.0 - 46.0 %   MCV 96.4 80.0 - 100.0 fL   MCH 32.2 26.0 - 34.0 pg   MCHC 33.4 30.0 - 36.0 g/dL   RDW 15.1 11.5 - 15.5 %   Platelets 275 150 - 400 K/uL   nRBC 0.0 0.0 - 0.2 %    Comment: Performed at [facility], [place] [address], [place], [place] [address]    MICRO: none IMAGING: No results found.  reviewed  Assessment/Plan:  50yo F with metastatic breast cancer with large chest wall soft tissue metastasis with significant drainage, foul-smelling. Increased in the last week due to lack of proper supplies. Does not appear to have bacterial superinfection.  - recommend to continue with wound care that absorbs drainage  alginate type of dressing, with silver- as anti-microbial - would stop iv abtx and observe off of abtx - continue with local wound care. May need more frequent dressing change to avoid macerated aspect of wound - if signs of fever, worsening purulence, or erythema of the chest wall, then would be a candidate for antibiotics.  Elzie Rings [facility] [phone]

## 2021-08-28 NOTE — Consult Note (Addendum)
[facility] Surgery Consult Note  Misty Arnold 06/19/1971  [id number].    Requesting MD: Doristine Bosworth, MD Chief Complaint/Reason for Consult: metastatic breast cancer with necrotic chest wall mass \ HPI:  Ms. Misty Arnold is a 50 y/oF with a PMH recurrent breast cancer of the right breast (DCIS), first diagnosed in 2011 s/p right mastectomy and reconstruction 01/2011. Margins were positive but no metastatic disease at that time. Patient was offered chemotherapy but refused and went to [place] for alternative treatments. In 2016 she underwent recurrent R mastectomy and attempted anti-estrogen therapy but stopped due to headache/HTN. She had lower extremity weakness due to metastatic tumor at T9 requiring decompression by neurosurgery. She has undergone palliative radiation and multiple forms of hormone therapy from 2021-2022 but has progressive disease including a large chest wall mass she is managing at home with wound care. She tells me today that her wound had been getting better, she has been seen by wound care physician (Dr. Heber Spencer) for the last month, but she ran out of supplies, and also noticed some green drainage, so she came to the hospital. General surgery has been asked to evaluate for possible debridement of chest wall. Today the patient tells me that she does not want any more surgery, even if it is recommended. Her mother is at the bedside.   ROS: Review of Systems  Constitutional:  Positive for weight loss.  HENT: Negative.    Eyes: Negative.   Respiratory: Negative.    Cardiovascular:  Positive for palpitations.  Gastrointestinal: Negative.   Genitourinary: Negative.   Musculoskeletal:  Positive for myalgias.  Skin: Negative.   Neurological: Negative.   Endo/Heme/Allergies: Negative.   Psychiatric/Behavioral: Negative.     Family History  Problem Relation Age of Onset   Stroke Mother    Heart attack Mother    Diabetes Mother    Hyperlipidemia Mother    Heart  disease Mother    Diabetes Father    Stroke Father    Stroke Maternal Grandmother    Cancer Maternal Grandfather    Colon cancer Maternal Grandfather    Stroke Maternal Grandfather    Stroke Paternal Grandmother    Heart attack Paternal Grandmother    Heart failure Paternal Grandmother    Diabetes Paternal Grandmother    Hyperlipidemia Paternal Grandmother    CAD Paternal Grandmother    Cancer Paternal Grandfather    Colon cancer Paternal Grandfather    Cancer Maternal Aunt 27       breast   Stroke Maternal Aunt    Heart attack Maternal Aunt    Breast cancer Maternal Aunt     Past Medical History:  Diagnosis Date   Abnormal uterine bleeding    Anemia    Breast cancer ([place])    Depression    Fibroid    Hormone disorder    Hypertension    Infertility, female     Past Surgical History:  Procedure Laterality Date   ABDOMINAL HYSTERECTOMY     BREAST SURGERY     LAMINECTOMY N/A 01/04/2020   Procedure: Thoracic Eight, Thoracic Nine THORACIC LAMINECTOMY FOR TUMOR with Thoracic Seven-Thoracic Ten instrumentation;  Surgeon: [name], MD;  Location: [facility];  Service: Neurosurgery;  Laterality: N/A;  Thoracic Eight, Thoracic Nine THORACIC LAMINECTOMY FOR TUMOR with Thoracic Seven-Thoracic Ten instrumentation   MASTECTOMY     salivary gland stone      Social History:  reports that she has never smoked. She has never used smokeless tobacco. She reports that she  does not currently use alcohol. She reports that she does not use drugs.  Allergies:  Allergies  Allergen Reactions   Amoxicillin     Other reaction(s): rash/itching   Penicillins Rash    Did it involve swelling of the face/tongue/throat, SOB, or low BP? no Did it involve sudden or severe rash/hives, skin peeling, or any reaction on the inside of your mouth or nose? Yes Did you need to seek medical attention at a hospital or doctor's office? No When did it last happen? early 20's     If all above answers are "NO", may  proceed with cephalosporin use.   Pork-Derived Products    Penicillin G    Tamoxifen Hypertension    Medications Prior to Admission  Medication Sig Dispense Refill   acetaminophen (TYLENOL) 325 MG tablet Take 2 tablets (650 mg total) by mouth every 4 (four) hours as needed for mild pain ((score 1 to 3) or temp > 100.5). 30 tablet 0   amLODipine (NORVASC) 10 MG tablet Take 1 tablet (10 mg total) by mouth daily. 90 tablet 3   Ascorbic Acid (VITAMIN C PO) Take 1,000 mg by mouth daily.     bisacodyl (DULCOLAX) 10 MG suppository Place 1 suppository (10 mg total) rectally daily as needed for moderate constipation. 12 suppository 0   Calcium Carb-Cholecalciferol (OYSTER SHELL CALCIUM W/D) 500-200 MG-UNIT TABS TAKE 1 TABLET BY MOUTH TWICE A DAY WITH MEALS (Patient taking differently: Take 1 tablet by mouth 2 (two) times daily with a meal.) 180 tablet 1   Cholecalciferol (VITAMIN D3) 125 MCG (5000 UT) CAPS TAKE 1 CAPSULE (5,000 UNITS TOTAL) BY MOUTH 2 (TWO) TIMES DAILY. (Patient taking differently: Take 5,000 Units by mouth in the morning and at bedtime.) 180 capsule 3   dexamethasone (DECADRON) 1 MG tablet Take 1 mg by mouth daily as needed (when appetite  is low).     diazepam (VALIUM) 5 MG tablet Take 1 tablet (5 mg total) by mouth every 12 (twelve) hours as needed for muscle spasms. 60 tablet 3   Flaxseed Oil OIL Take 5 mLs by mouth in the morning, at noon, and at bedtime.      goserelin (ZOLADEX) 3.6 MG injection Inject 3.6 mg into the skin every 28 (twenty-eight) days.     letrozole (FEMARA) 2.5 MG tablet TAKE 1 TABLET BY MOUTH EVERY DAY (Patient taking differently: Take 2.5 mg by mouth daily.) 90 tablet 1   lidocaine-prilocaine (EMLA) cream APPLY TOPICALLY AS NEEDED. (Patient taking differently: Apply 1 application topically See admin instructions. Use with zoladex injection) 30 g 1   Oxycodone HCl 10 MG TABS Take 1 tablet (10 mg total) by mouth every 3 (three) hours as needed ((score 7 to 10)).  180 tablet 0   palbociclib (IBRANCE) 125 MG tablet TAKE 1 TABLET (125 MG TOTAL) BY MOUTH DAILY. TAKE FOR 21 DAYS ON, 7 DAYS OFF, REPEAT EVERY 28 DAYS. (Patient taking differently: Take 125 mg by mouth See admin instructions. Take for 21 days, then off for 7 days, repeat every 28 days) 21 tablet 2   polyethylene glycol (MIRALAX / GLYCOLAX) 17 g packet Take 17 g by mouth daily as needed for mild constipation. 14 each 0    Blood pressure 122/77, pulse (!) 107, temperature 97.8 F (36.6 C), temperature source Oral, resp. rate 18, height 5\' 5"  (1.651 m), weight 60 kg, SpO2 99 %. Physical Exam: Constitutional: NAD; conversant; thin female Eyes: Moist conjunctiva; no lid lag; anicteric; PERRL Neck: Trachea  midline; no thyromegaly Chest wall: there is a large, ~15cm necrotic chest wall mass surrounded by several satellite lesions, majority of wound bed is yellow/fibrinous with some bleeding red areas as well. Significant amt of drainage is present. The drainage is mostly dark and serous but some yellow exudate is also coming off on the dressings.  Lungs: Normal respiratory effort; CTAB CV: RRR; no palpable thrills; no pitting edema GI: Abd soft; no palpable hepatosplenomegaly MSK: Normal gait; no clubbing/cyanosis Psychiatric: Appropriate affect; alert and oriented x3 Lymphatic: there is chest wall lymphadenopathy   Results for orders placed or performed during the hospital encounter of 08/25/21 (from the past 48 hour(s))  Basic metabolic panel     Status: Abnormal   Collection Time: 08/27/21  1:00 AM  Result Value Ref Range   Sodium 137 135 - 145 mmol/L   Potassium 3.7 3.5 - 5.1 mmol/L   Chloride 103 98 - 111 mmol/L   CO2 27 22 - 32 mmol/L   Glucose, Bld 125 (H) 70 - 99 mg/dL    Comment: Glucose reference range applies only to samples taken after fasting for at least 8 hours.   BUN 9 6 - 20 mg/dL   Creatinine, Ser 0.97 0.44 - 1.00 mg/dL   Calcium 9.1 8.9 - 10.3 mg/dL   GFR, Estimated >60 >60  mL/min    Comment: (NOTE) Calculated using the CKD-EPI Creatinine Equation (2021)    Anion gap 7 5 - 15    Comment: Performed at [facility] [address], [place], [place] [address]  CBC     Status: Abnormal   Collection Time: 08/27/21  1:00 AM  Result Value Ref Range   WBC 2.2 (L) 4.0 - 10.5 K/uL   RBC 3.35 (L) 3.87 - 5.11 MIL/uL   Hemoglobin 10.8 (L) 12.0 - 15.0 g/dL   HCT 32.3 (L) 36.0 - 46.0 %   MCV 96.4 80.0 - 100.0 fL   MCH 32.2 26.0 - 34.0 pg   MCHC 33.4 30.0 - 36.0 g/dL   RDW 15.1 11.5 - 15.5 %   Platelets 275 150 - 400 K/uL   nRBC 0.0 0.0 - 0.2 %    Comment: Performed at [facility], [place] [address] [place], [place] [address]   No results found.   Assessment/Plan Ms. Misty Arnold is a 50 y/o female with metastatic breast cancer and a large, necrotic chest wall wound as a result of chest wall soft tissue metastasis. While the chest wall wound is large, foul-smelling, and has a lot of drainage, I do not see any signs of significant superinfection, nor do I think it would benefit from debridement. Debridement would not offer any palliation, as it would leave her with a large, painful wound that would likely not heal in the setting of her cancer. Based on her exam, I do think a majority of her wound drainage is just from tumor necrosis, not purulence from infection. There is not  cellulitis. There have been no wound cultures thus far. Unfortunately I do not see any helpful surgical options for this patient. Recommend continued wound care, treatment, and pain control per palliative care, oncology, and wound care physician.   Jill Alexanders, PA-C [facility] Please see Amion for pager number during day hours 7:00am-4:30pm 08/28/2021, 2:04 PM

## 2021-08-28 NOTE — Progress Notes (Signed)
Pharmacy Antibiotic Note  Misty Arnold is a 50 y.o. female admitted on 08/25/2021 with cellulitis.  Pharmacy has been consulted for vancomycin dosing.  50 yof with a history of  HTN and metastatic breast cancer to bone presenting with wound infection. Currently on D#3 of broad spectrum antibiotics.   SCr 0.97 (Stable) WBC 2.2; LA 1.2  Plan: Increase Cefepime to 2g q8h Continue vancomycin 1250 mg q24hr (eAUC 540) Trend WBC, fever, renal function Levels at steady state No cultures ordered  De-escalate when able  Height: 5\' 5"  (165.1 cm) Weight: 60 kg (132 lb 4.4 oz) IBW/kg (Calculated) : 57  Temp (24hrs), Avg:97.8 F (36.6 C), Min:97.6 F (36.4 C), Max:98.4 F (36.9 C)  Recent Labs  Lab 08/25/21 2236 08/27/21 0100  WBC 2.4* 2.2*  CREATININE 1.00 0.97  LATICACIDVEN 1.2  --      Estimated Creatinine Clearance: 61 mL/min (by C-G formula based on SCr of 0.97 mg/dL).    Allergies  Allergen Reactions   Amoxicillin     Other reaction(s): rash/itching   Penicillins Rash    Did it involve swelling of the face/tongue/throat, SOB, or low BP? no Did it involve sudden or severe rash/hives, skin peeling, or any reaction on the inside of your mouth or nose? Yes Did you need to seek medical attention at a hospital or doctor's office? No When did it last happen? early 20's     If all above answers are "NO", may proceed with cephalosporin use.   Pork-Derived Products    Penicillin G    Tamoxifen Hypertension   Antimicrobials this admission: cefepime 10/22 >>  vancomycin 10/22 >>   Microbiology results:   Thank you for allowing pharmacy to be a part of this patient's care.  Albertina Parr, PharmD., BCPS, BCCCP Clinical Pharmacist Please refer to [facility] for unit-specific pharmacist

## 2021-08-28 NOTE — Evaluation (Signed)
Physical Therapy Evaluation Patient Details Name: Misty Arnold MRN: [id number] DOB: [birth year] Today's Date: 08/28/2021  History of Present Illness  Pt. is 50 yr old F admitted on 10/21 for infected R breast CA surgical wound. PMH: anemia, depression, HTN, breast CA x 10 yrs with mets to spine and ribs  Clinical Impression  Pt. Demos mod I and safety with transfer and amb.  Not appropriate for skilled PT in acute care at this time.  Will refer to mobility team to make sure pt. Does not have decline in function while waiting for further treatment/potential surgery in acute care.  Please reconsult during hospital stay if there is a change in status.      Recommendations for follow up therapy are one component of a multi-disciplinary discharge planning process, led by the attending physician.  Recommendations may be updated based on patient status, additional functional criteria and insurance authorization.  Follow Up Recommendations No PT follow up    Assistance Recommended at Discharge None  Functional Status Assessment Patient has not had a recent decline in their functional status  Equipment Recommendations  Other (comment) (Pt has quad cane; may need new one that is not bent as current cane cannot be adjusted to proper height)    Recommendations for Other Services Other (comment) (Mobility team)     Precautions / Restrictions Precautions Precautions: None Restrictions Weight Bearing Restrictions: No      Mobility  Bed Mobility Overal bed mobility: Independent               Patient Response: Cooperative  Transfers Overall transfer level: Independent Equipment used: None               General transfer comment: Pt has quad cane that she uses for long distances and community mobility. Ambulates in room with IV pole during session.    Ambulation/Gait Ambulation/Gait assistance: Modified independent (Device/Increase time) Gait Distance (Feet): 600  Feet Assistive device: Quad cane Gait Pattern/deviations: WFL(Within Functional Limits)     General Gait Details: Quad cane is too high, but unable to adjust due to bend in cane.  Stairs            Wheelchair Mobility    Modified Rankin (Stroke Patients Only)       Balance Overall balance assessment: Independent                                           Pertinent Vitals/Pain Pain Assessment: 0-10 Pain Score: 2  Pain Location: Abd; R side Pain Descriptors / Indicators: Tender;Discomfort Pain Intervention(s): Monitored during session    Home Living Family/patient expects to be discharged to:: Private residence Living Arrangements: Other relatives Available Help at Discharge: Family (Lives with elderly relative - states she is currently in rehab and pt. is home alone) Type of Home: House Home Access: Stairs to enter Entrance Stairs-Rails: None Entrance Stairs-Number of Steps: 2   Home Layout: One level Home Equipment: Cane - quad      Prior Function Prior Level of Function : Independent/Modified Independent             Mobility Comments: Uses SB[facility] for community amb, no AD in home ADLs Comments: Independent     Hand Dominance   Dominant Hand: Right    Extremity/Trunk Assessment   Upper Extremity Assessment Upper Extremity Assessment: Overall WFL for tasks assessed  Lower Extremity Assessment Lower Extremity Assessment: Overall WFL for tasks assessed       Communication   Communication: No difficulties  Cognition Arousal/Alertness: Awake/alert Behavior During Therapy: WFL for tasks assessed/performed Overall Cognitive Status: Within Functional Limits for tasks assessed                                 General Comments: Alert and oriented x 3.  Agreeable to work with PT.        General Comments General comments (skin integrity, edema, etc.): Encouraged to continue to mobilize during admission.  Demos  understanding.    Exercises     Assessment/Plan    PT Assessment Patient does not need any further PT services (Will refer to mobility team for continued ambulation)  PT Problem List         PT Treatment Interventions      PT Goals (Current goals can be found in the Care Plan section)  Acute Rehab PT Goals Patient Stated Goal: Pt's goal is to maintain independence PT Goal Formulation: With patient Time For Goal Achievement: 09/11/21 Potential to Achieve Goals: Good    Frequency     Barriers to discharge        Co-evaluation               AM-PAC PT "6 Clicks" Mobility  Outcome Measure Help needed turning from your back to your side while in a flat bed without using bedrails?: None Help needed moving from lying on your back to sitting on the side of a flat bed without using bedrails?: None Help needed moving to and from a bed to a chair (including a wheelchair)?: None Help needed standing up from a chair using your arms (e.g., wheelchair or bedside chair)?: None Help needed to walk in hospital room?: None Help needed climbing 3-5 steps with a railing? : None 6 Click Score: 24    End of Session Equipment Utilized During Treatment: Gait belt Activity Tolerance: Patient tolerated treatment well Patient left: Other (comment) (in bathroom)   PT Visit Diagnosis: Other abnormalities of gait and mobility (R26.89)    Time: 4696-2952 PT Time Calculation (min) (ACUTE ONLY): 19 min   Charges:   PT Evaluation $PT Eval Low Complexity: 1 Low          Clydette Privitera A. Nyles Mitton, PT, DPT Acute Rehabilitation Services Office: [place] 08/28/2021, 10:39 AM

## 2021-08-28 NOTE — Consult Note (Signed)
[place] Nurse Consult Note: Dr. Doristine Bosworth requested input and recommendation regarding consulting [facility] for assessment and evaluation for possible debridement of wound, input to and oversight of wound care. I endorse this idea as the patient's wound is considered non healable given the neoplastic etiology and the amount of nonviable tissue in the wound contributing to drainage/odor.  Patient is currently being followed by the outpatient [place] at [facility] by Dr. Heber Hampshire.   This recommendation is sent to Dr. Doristine Bosworth via [employer] as well as being documented here in the EHR.  [place] nursing team will not follow, but will remain available to this patient, the nursing and medical teams.  Please re-consult if needed. Thanks, Maudie Flakes, MSN, RN, [place], Arther Abbott  Pager# [phone]

## 2021-08-28 NOTE — Progress Notes (Signed)
Initial Nutrition Assessment  DOCUMENTATION CODES:   Not applicable  INTERVENTION:   -MVI with minerals daily -Boost Breeze po TID, each supplement provides 250 kcal and 9 grams of protein  -30 ml Prosource Plus TID, each supplement provides 100 kcals and 15 grams protein  NUTRITION DIAGNOSIS:   Increased nutrient needs related to wound healing as evidenced by estimated needs.  GOAL:   Patient will meet greater than or equal to 90% of their needs  MONITOR:   PO intake, Supplement acceptance, Labs, Weight trends, Skin, I & O's  REASON FOR ASSESSMENT:   Consult Assessment of nutrition requirement/status  ASSESSMENT:   Misty Arnold is a 50 y.o. female with medical history significant of HTN and metastatic breast cancer to bone presenting with wound infection.  She seemed to be doing ok since her surgery.  Dr. Trenton Gammon said it would be 2 years before she could walk due to spinal mets and surgery.  The breast cancer just keeps coming back.  She had HHN but she has been taking care of the wound herself.  Dr. Lindi Adie referred her to wound care.  Wound care sent her all the supplies she needed the first time.  She required more supplies than were provided through Medicare.  2 weeks ago, she needed new supplies and the box came without everything needed.  When she took off the ABD pads it would stick to the wound which was causing bleeding and irritation.  She tried to get more supplies without success - Medicare wouldn't provide the supplies.  She started noticing a color change in her fluid and decided to come to the ER.  The fluid amount is increasing, needs to change bandages q5h.  It was previously light yellow and is now light green.  Dr. Geralyn Flash nurse recommended that she come to the ER.  She has not had pain meds since 5pm yesterday.  Pt admitted with infected wound secondary to metastatic breast cancer to bone.   Reviewed I/O's: +1.4 L x 24 hours and +2.8 L since admission  Spoke  with pt and mother at bedside and spoke extensively with them (assessed with Palliative Care Team). Pt reports that she has a good appetite and usually consumes 3 meals per day (Breakfast: cream of wheat, yogurt, boiled eggs, fruit; Lunch and Dinner: salmon or grass fed beef, salad and/or vegetables). Pt also enjoys drinking detox teas with dandelion leaf. Noted meal completion 25-100%.   Pt reports that she has had difficulty with wound over the past few months, since following at the wound center. Pt is very focused on her wound care and makes sure she cleanses the site well. Per pt, she has had difficulty obtaining the supplies that she needed, which has impeded her wound healing.   Per pt, she lost down to 90#, but has regained wt over the past several months (up to baseline of 125#). Reviewed wt hx; noted no wt loss over the past 8 months.   Discussed importance of good meal and supplement intake to promote healing. Pt reports she does not tolerate milky supplements well and is interested in [employer].   Medications reviewed and include vitamin C, vitamin D3, colace, and lactated ringers infusion @ 75 ml/hr.   Labs reviewed.   NUTRITION - FOCUSED PHYSICAL EXAM:  Flowsheet Row Most Recent Value  Orbital Region No depletion  Upper Arm Region Mild depletion  Thoracic and Lumbar Region No depletion  Buccal Region No depletion  Temple Region Mild depletion  Clavicle Bone Region Moderate depletion  Clavicle and Acromion Bone Region No depletion  Scapular Bone Region No depletion  Dorsal Hand No depletion  Patellar Region No depletion  Anterior Thigh Region No depletion  Posterior Calf Region No depletion  Edema (RD Assessment) Moderate  Hair Reviewed  Eyes Reviewed  Mouth Reviewed  Skin Reviewed  Nails Reviewed       Diet Order:   Diet Order             Diet regular Room service appropriate? Yes; Fluid consistency: Thin  Diet effective now                    EDUCATION NEEDS:   Education needs have been addressed  Skin:  Skin Assessment: Skin Integrity Issues: Skin Integrity Issues:: Other (Comment) Other: rt breast wound  Last BM:  08/25/21  Height:   Ht Readings from Last 1 Encounters:  08/25/21 5\' 5"  (1.651 m)    Weight:   Wt Readings from Last 1 Encounters:  08/25/21 60 kg    Ideal Body Weight:  56.8 kg  BMI:  Body mass index is 22.01 kg/m.  Estimated Nutritional Needs:   Kcal:  1900-2100  Protein:  105-120 grams  Fluid:  > 1.9 L    Loistine Chance, RD, LDN, [place] Registered Dietitian II Certified Diabetes Care and Education Specialist Please refer to [facility] for RD and/or RD on-call/weekend/after hours pager

## 2021-08-28 NOTE — Progress Notes (Signed)
Mobility Specialist Progress Note:   08/28/21 1413  Mobility  Activity Ambulated in hall  Level of Assistance Standby assist, set-up cues, supervision of patient - no hands on  Assistive Device Four point cane  Distance Ambulated (ft) 600 ft  Mobility Ambulated with assistance in hallway  Mobility Response Tolerated well  Mobility performed by Mobility specialist   Pt received in chair willing to participate in mobility. No complaints of pain and asymptomatic throughout walk. Pt requesting if there is something for her chest that can hold the gauze in place. Pt returned to chair with call bell in reach, all needs met and family present.  [facility] Health and safety inspector Phone [phone]

## 2021-08-29 ENCOUNTER — Encounter (HOSPITAL_BASED_OUTPATIENT_CLINIC_OR_DEPARTMENT_OTHER): Payer: Medicare PPO | Admitting: Internal Medicine

## 2021-08-29 ENCOUNTER — Inpatient Hospital Stay: Payer: Medicare PPO

## 2021-08-29 DIAGNOSIS — Z7189 Other specified counseling: Secondary | ICD-10-CM | POA: Diagnosis not present

## 2021-08-29 DIAGNOSIS — L089 Local infection of the skin and subcutaneous tissue, unspecified: Secondary | ICD-10-CM | POA: Diagnosis not present

## 2021-08-29 DIAGNOSIS — Z515 Encounter for palliative care: Secondary | ICD-10-CM | POA: Diagnosis not present

## 2021-08-29 DIAGNOSIS — T148XXA Other injury of unspecified body region, initial encounter: Secondary | ICD-10-CM | POA: Diagnosis not present

## 2021-08-29 LAB — CBC WITH DIFFERENTIAL/PLATELET
Abs Immature Granulocytes: 0.01 10*3/uL (ref 0.00–0.07)
Basophils Absolute: 0.1 10*3/uL (ref 0.0–0.1)
Basophils Relative: 3 %
Eosinophils Absolute: 0 10*3/uL (ref 0.0–0.5)
Eosinophils Relative: 1 %
HCT: 30.8 % — ABNORMAL LOW (ref 36.0–46.0)
Hemoglobin: 10.4 g/dL — ABNORMAL LOW (ref 12.0–15.0)
Immature Granulocytes: 0 %
Lymphocytes Relative: 18 %
Lymphs Abs: 0.4 10*3/uL — ABNORMAL LOW (ref 0.7–4.0)
MCH: 32.4 pg (ref 26.0–34.0)
MCHC: 33.8 g/dL (ref 30.0–36.0)
MCV: 96 fL (ref 80.0–100.0)
Monocytes Absolute: 0.1 10*3/uL (ref 0.1–1.0)
Monocytes Relative: 6 %
Neutro Abs: 1.8 10*3/uL (ref 1.7–7.7)
Neutrophils Relative %: 72 %
Platelets: 318 10*3/uL (ref 150–400)
RBC: 3.21 MIL/uL — ABNORMAL LOW (ref 3.87–5.11)
RDW: 14.8 % (ref 11.5–15.5)
WBC: 2.4 10*3/uL — ABNORMAL LOW (ref 4.0–10.5)
nRBC: 0 % (ref 0.0–0.2)

## 2021-08-29 LAB — BASIC METABOLIC PANEL
Anion gap: 7 (ref 5–15)
BUN: 8 mg/dL (ref 6–20)
CO2: 29 mmol/L (ref 22–32)
Calcium: 9.5 mg/dL (ref 8.9–10.3)
Chloride: 100 mmol/L (ref 98–111)
Creatinine, Ser: 0.87 mg/dL (ref 0.44–1.00)
GFR, Estimated: >60 mL/min (ref >60)
Glucose, Bld: 119 mg/dL — ABNORMAL HIGH (ref 70–99)
Potassium: 3.5 mmol/L (ref 3.5–5.1)
Sodium: 136 mmol/L (ref 135–145)

## 2021-08-29 LAB — MAGNESIUM: Magnesium: 2.1 mg/dL (ref 1.7–2.4)

## 2021-08-29 MED ORDER — GOSERELIN ACETATE 10.8 MG ~~LOC~~ IMPL: 10.8000 mg | DRUG_IMPLANT | Freq: Once | SUBCUTANEOUS | Status: DC

## 2021-08-29 MED ORDER — GOSERELIN ACETATE 3.6 MG ~~LOC~~ IMPL
3.6000 mg | DRUG_IMPLANT | Freq: Once | SUBCUTANEOUS | Status: AC
  Administered 2021-08-30: 3.6 mg via SUBCUTANEOUS
  Filled 2021-08-29: qty 3.6

## 2021-08-29 NOTE — Progress Notes (Signed)
PROGRESS NOTE    Kelliann Pendergraph  GEX:[id number] DOB: 02-18-1971 DOA: 08/25/2021 PCP: Nicholas Lose, MD  Outpatient Specialists:   Brief Narrative:  Esthela Brandner is a 50 y.o. female with medical history significant of HTN and metastatic breast cancer to bone presented with worsening right chest wall wound.  She seemed to be doing ok since her surgery.  Dr. Trenton Gammon said it would be 2 years before she could walk due to spinal mets and surgery.  Dr. Lindi Adie referred her to wound care.  Wound care sent her all the supplies she needed the first time.  She required more supplies than were provided through Medicare.  2 weeks ago, she needed new supplies and the box came without everything needed.  When she took off the ABD pads it would stick to the wound which was causing bleeding and irritation.  She tried to get more supplies without success - Medicare wouldn't provide the supplies.  She started noticing a color change in her fluid and decided to come to the ER.  The fluid amount is increasing, needs to change ABD pads q5h.  It was previously light yellow and is now light green.  Dr. Geralyn Flash nurse recommended that she come to the ER.  She has had multiple surgeries.  In 2016, she had a TRAM procedure.  Assessment & Plan:   Principal Problem:   Infected wound Active Problems:   Breast cancer of upper-outer quadrant of right female breast ([place])   Metastasis of neoplasm to spinal canal [facility])   Essential hypertension   Breast wound    wound of the right breast/chest wall -Patient with chest wall chronic wound associated with malignancy -Patient was started on IV vancomycin, IV cefepime and metronidazole gel.  Wound care consulted.  Wound care resumed her outpatient wound dressings.  Due to concern of the need of debridement, general surgery was consulted however they opined that debridement may cause further wound which may not heal and that she does not need any debridement at this point in time.   ID was consulted who opined that patient's wound does not seem to have any sort of superinfection or bacterial infection and decided to discontinue antibiotics and watch.  If she were to develop fever, purulent discharge or erythema of the wound then she will be candidate for antibiotics.   Metastatic breast cancer -She refused radiation treatment but did undergo limited palliative rads tx after spinal meds -She is currently on Zoladex, Ibrance, and Letrozole -Palliative care input is appreciated.  She was seen by chaplain as well. -She is a full code at this time Pain is controlled.   HTN -Controlled on Norvasc.    DVT prophylaxis: Subcutaneous Lovenox Code Status: Full code Family Communication: Her caregiver over the phone. Disposition Plan: Home eventually   Consultants:  General surgery ID  Procedures:  None  Antimicrobials:  IV vancomycin IV cefepime Metronidazole gel   Subjective:  Patient seen and examined.  She was with the chaplain at that point in time and cheerful, her caregiver was on the speaker phone on the other side.  Patient has no complaints.  Her drainage has improved.  Objective: Vitals:   08/28/21 1724 08/28/21 2009 08/29/21 0447 08/29/21 0803  BP: 113/75 (!) 135/91 108/75 121/85  Pulse: (!) 102 (!) 105 85 (!) 105  Resp: 18   18  Temp: 97.8 F (36.6 C) (!) 97.5 F (36.4 C) 98.6 F (37 C) 98 F (36.7 C)  TempSrc: Oral Oral Oral Oral  SpO2: 98% 100% 99% 100%  Weight:      Height:        Intake/Output Summary (Last 24 hours) at 08/29/2021 1125 Last data filed at 08/29/2021 0930 Gross per 24 hour  Intake 2074.53 ml  Output --  Net 2074.53 ml    Filed Weights   08/25/21 2157  Weight: 60 kg    Examination:  General exam: Appears calm and comfortable  Respiratory system: Clear to auscultation. Respiratory effort normal. Cardiovascular system: S1 & S2 heard, RRR. No JVD, murmurs, rubs, gallops or clicks. No pedal  edema. Gastrointestinal system: Abdomen is nondistended, soft and nontender. No organomegaly or masses felt. Normal bowel sounds heard. Central nervous system: Alert and oriented. No focal neurological deficits. Extremities: Symmetric 5 x 5 power. Skin: as pictured below. Psychiatry: Judgement and insight appear normal. Mood & affect appropriate.         Data Reviewed: I have personally reviewed following labs and imaging studies  CBC: Recent Labs  Lab 08/25/21 2236 08/27/21 0100 08/29/21 0832  WBC 2.4* 2.2* 2.4*  NEUTROABS 1.3*  --  1.8  HGB 11.1* 10.8* 10.4*  HCT 33.0* 32.3* 30.8*  MCV 97.6 96.4 96.0  PLT 279 275 161    Basic Metabolic Panel: Recent Labs  Lab 08/25/21 2236 08/27/21 0100 08/29/21 0832  NA 138 137 136  K 3.6 3.7 3.5  CL 103 103 100  CO2 25 27 29   GLUCOSE 109* 125* 119*  BUN 14 9 8   CREATININE 1.00 0.97 0.87  CALCIUM 8.9 9.1 9.5  MG  --   --  2.1    GFR: Estimated Creatinine Clearance: 68.1 mL/min (by C-G formula based on SCr of 0.87 mg/dL). Liver Function Tests: No results for input(s): AST, ALT, ALKPHOS, BILITOT, PROT, ALBUMIN in the last 168 hours. No results for input(s): LIPASE, AMYLASE in the last 168 hours. No results for input(s): AMMONIA in the last 168 hours. Coagulation Profile: No results for input(s): INR, PROTIME in the last 168 hours. Cardiac Enzymes: No results for input(s): CKTOTAL, CKMB, CKMBINDEX, TROPONINI in the last 168 hours. BNP (last 3 results) No results for input(s): PROBNP in the last 8760 hours. HbA1C: No results for input(s): HGBA1C in the last 72 hours. CBG: No results for input(s): GLUCAP in the last 168 hours. Lipid Profile: No results for input(s): CHOL, HDL, LDLCALC, TRIG, CHOLHDL, LDLDIRECT in the last 72 hours. Thyroid Function Tests: No results for input(s): TSH, T4TOTAL, FREET4, T3FREE, THYROIDAB in the last 72 hours. Anemia Panel: No results for input(s): VITAMINB12, FOLATE, FERRITIN, TIBC, IRON,  RETICCTPCT in the last 72 hours. Urine analysis:    Component Value Date/Time   COLORURINE YELLOW 05/11/2021 2323   APPEARANCEUR CLEAR 05/11/2021 2323   LABSPEC 1.020 05/11/2021 2323   PHURINE 5.0 05/11/2021 2323   GLUCOSEU NEGATIVE 05/11/2021 2323   HGBUR NEGATIVE 05/11/2021 2323   BILIRUBINUR NEGATIVE 05/11/2021 2323   KETONESUR 5 (A) 05/11/2021 2323   PROTEINUR NEGATIVE 05/11/2021 2323   NITRITE NEGATIVE 05/11/2021 2323   LEUKOCYTESUR NEGATIVE 05/11/2021 2323   Sepsis Labs: @LABRCNTIP (procalcitonin:4,lacticidven:4)  ) Recent Results (from the past 240 hour(s))  Resp Panel by RT-PCR (Flu A&B, Covid) Nasopharyngeal Swab     Status: None   Collection Time: 08/26/21  8:10 AM   Specimen: Nasopharyngeal Swab; Nasopharyngeal(NP) swabs in vial transport medium  Result Value Ref Range Status   SARS Coronavirus 2 by RT PCR NEGATIVE NEGATIVE Final    Comment: (NOTE) SARS-CoV-2 target nucleic acids are NOT  DETECTED.  The SARS-CoV-2 RNA is generally detectable in upper respiratory specimens during the acute phase of infection. The lowest concentration of SARS-CoV-2 viral copies this assay can detect is 138 copies/mL. A negative result does not preclude SARS-Cov-2 infection and should not be used as the sole basis for treatment or other patient management decisions. A negative result may occur with  improper specimen collection/handling, submission of specimen other than nasopharyngeal swab, presence of viral mutation(s) within the areas targeted by this assay, and inadequate number of viral copies(<138 copies/mL). A negative result must be combined with clinical observations, patient history, and epidemiological information. The expected result is Negative.  Fact Sheet for Patients:  EntrepreneurPulse.com.au  Fact Sheet for Healthcare Providers:  IncredibleEmployment.be  This test is no t yet approved or cleared by the [place] FDA and   has been authorized for detection and/or diagnosis of SARS-CoV-2 by FDA under an Emergency Use Authorization (EUA). This EUA will remain  in effect (meaning this test can be used) for the duration of the COVID-19 declaration under Section 564(b)(1) of the Act, 21 U.S.C.section 360bbb-3(b)(1), unless the authorization is terminated  or revoked sooner.       Influenza A by PCR NEGATIVE NEGATIVE Final   Influenza B by PCR NEGATIVE NEGATIVE Final    Comment: (NOTE) The Xpert Xpress SARS-CoV-2/FLU/RSV plus assay is intended as an aid in the diagnosis of influenza from Nasopharyngeal swab specimens and should not be used as a sole basis for treatment. Nasal washings and aspirates are unacceptable for Xpert Xpress SARS-CoV-2/FLU/RSV testing.  Fact Sheet for Patients: EntrepreneurPulse.com.au  Fact Sheet for Healthcare Providers: IncredibleEmployment.be  This test is not yet approved or cleared by the [place] FDA and has been authorized for detection and/or diagnosis of SARS-CoV-2 by FDA under an Emergency Use Authorization (EUA). This EUA will remain in effect (meaning this test can be used) for the duration of the COVID-19 declaration under Section 564(b)(1) of the Act, 21 U.S.C. section 360bbb-3(b)(1), unless the authorization is terminated or revoked.  Performed at [facility], [place] [address], [place], [place] [address]     Radiology Studies: No results found.   Scheduled Meds:  (feeding supplement) PROSource Plus  30 mL Oral TID BM   amLODipine  10 mg Oral Daily   vitamin C  1,000 mg Oral Daily   calcium-vitamin D  1 tablet Oral BID WC   cholecalciferol  5,000 Units Oral BID   docusate sodium  100 mg Oral BID   enoxaparin (LOVENOX) injection  40 mg Subcutaneous Q24H   feeding supplement  1 Container Oral TID BM   letrozole  2.5 mg Oral Daily   metroNIDAZOLE   Topical Daily   multivitamin with minerals  1 tablet Oral  Daily   oxyCODONE  15 mg Oral Q12H   palbociclib  125 mg Oral Daily   Continuous Infusions:  lactated ringers 75 mL/hr at 08/29/21 0534     LOS: 1 day   Time spent: 30 minutes Darliss Cheney, MD  [facility] 7PM-7AM contact night coverage as above

## 2021-08-29 NOTE — Progress Notes (Signed)
Mobility Specialist Progress Note:  08/29/21 1400  Mobility  Activity Ambulated in hall  Level of Assistance Standby assist, set-up cues, supervision of patient - no hands on  Assistive Device Four point cane  Distance Ambulated (ft) 600 ft  Mobility Ambulated with assistance in hallway  Mobility Response Tolerated well  Mobility performed by Mobility specialist  $Mobility charge 1 Mobility   Pt received EOB willing to participate in mobility. No complaints of pain and asymptomatic throughout ambulation. Pt returned to EOB with call bell in reach and all needs met.   [facility] Health and safety inspector Phone [phone]

## 2021-08-29 NOTE — Progress Notes (Signed)
Palliative Medicine Inpatient Follow Up Note  Consulting Provider: Karmen Bongo, MD   Reason for consult:   Question Answer  Reason for Consult? breast cancer wound    HPI:  Per intake H&P --> Misty Arnold is a 50 y.o. female with medical history significant of HTN and metastatic breast cancer to bone presenting with wound infection.  She seemed to be doing ok since her surgery.  Dr. Trenton Gammon said it would be 2 years before she could walk due to spinal mets and surgery.  The breast cancer just keeps coming back.  She had HHN but she has been taking care of the wound herself.  Dr. Lindi Adie referred her to wound care.    Palliative care has been consulted for management of symptoms in the setting of metastatic breast cancer and a large exudative wound on her right chest from a necrotic mass.  Today's Discussion (08/29/2021):  *Please note that this is a verbal dictation therefore any spelling or grammatical errors are due to the "[place] [employer]" system interpretation.  Chart reviewed.   I met with [name] at bedside. She was upright and preparing to order breakfast. I asked her how her pain was and she shares that from her perspective she is still having breakthrough pain at 3AM - she was provided x1 dose of IV morphine overnight. She expresses that this was helpful though she knows she cannot have this in her home. We discussed that she does not know if the Oxycontin is working though she shares that she has had difficulty in the setting of her constipation ad she feels this too may have been a contributor to generalized discomfort.   We reviewed a plan to continue the OxyContin as she has only had three total doses. The goal would be to better identify if an achievable effect for pain management is obtainable.  From the perspective of constipation Dwight shares that she got a suppository and this morning was finally able to have a substantial bowel movement.  We reviewed that  [place] worked throughout the day yesterday to ensure that she would have adequate supplies to address her complex dressing changes in her home. She states that the wound clinic is going to allow her to order supplies on her own thus increasing the likelihood of success in terms of caring for her wound.   From a physical standpoint Starletta has mobilized with PT and finds that this was a helpful intervention. She expresses hope to mobilize further.   Annella shares with me that if additional appointments are set up after discharge they will need to be co-horted into the same day as her ability to get to and from [facility] is difficult being that she cannot drive on her own.   We reviewed that Tamar from an emotional perspective feels fairly today. She remains optimistic for the future.   Questions and concerns addressed   Objective Assessment: Vital Signs Vitals:   08/28/21 2009 08/29/21 0447  BP: (!) 135/91 108/75  Pulse: (!) 105 85  Resp:    Temp: (!) 97.5 F (36.4 C) 98.6 F (37 C)  SpO2: 100% 99%    Intake/Output Summary (Last 24 hours) at 08/29/2021 0746 Last data filed at 08/28/2021 1800 Gross per 24 hour  Intake 1834.53 ml  Output --  Net 1834.53 ml   Last Weight  Most recent update: 08/25/2021  9:57 PM    Weight  60 kg (132 lb 4.4 oz)  Gen:  Middle aged [place] F in NAD HEENT: moist mucous membranes CV: Irregular rate and rhythm  PULM: On RA, breathing is even and non-labored ABD: (+) large wound R chest extending to abdomen, covered with dressing EXT: No edema  Neuro: Alert and oriented x3   SUMMARY OF RECOMMENDATIONS   Full Code/ Full Scope of Care   Continue current measures to support care   Referral to OP Palliative Oncology clinic - Have spoken to [facility] via telephone to make aware   Referral for anxiety cognitive behavioral therapy on discharge - will refer to psychologist Elias Else [phone] --> Left a VM today    Symptom needs as below   Ongoing PMT support    Code Status/Advance Care Planning: FULL CODE   Symptom Management:  Generalized Pain from spine mets: - Oxycontin 63m PO Q12H - Continue oxycodone 1230mPO Q3H PRN - Continue morphine 30m64mV Q2H PRN   Anxiety & Stress: - Would benefit from cognitive behavioral therapy as does not wish to take medications for this. Will refer for cognitive behavioral therapy. - Patient utilizes aromatherapy oils and detoxifying foot therapy on her own   Bowel Regularity: - Continue Miralax PRN - Patient will continue coffee enemas on DC   Wound Care: - Appreciate wound care consult  - metronidazole 0.75 % gel daily x 14 days total   Muscular Weakness: - PT/OT   Nutrition: - Dietary consult - Receiving decadron 1mg58m PRN   Muscle Spasms: - Continue valium 5mg 55mQ12H PRN   Social: - Patient is interested in moving closer to her doctors but lives on a fixed income. Appreciate MSW to help navigate the possibilities.   Emotional Support: - Patient is a member of a support group though it is limited in her small town - Will provide additional resources for support groups of woman contending with breast cancer   Advance Care Planning: - Appreciate Chaplain to help complete advance directives  Time Spent: 55 minutes Greater than 50% of the time was spent in counseling and coordination of care ______________________________________________________________________________________ [facility] Team Cell Phone: [phone] utilize secure chat with additional questions, if there is no response within 30 minutes please call the above phone number  Palliative Medicine Team providers are available by phone from 7am to 7pm daily and can be reached through the team cell phone.  Should this patient require assistance outside of these hours, please call the patient's attending physician.

## 2021-08-29 NOTE — Progress Notes (Signed)
This chaplain responded to PMT consult for creating/updating the Pt. Advance Directive.  The chaplain recognizes the Pt. desire to postpone the AD discussion and offer the Pt. a space for reflective listening.   The Pt. describes herself as a spiritual person.  The chaplain understands the Pt. faith and significant relationships grow stronger  with and through God's love.  The Pt. references Eula Flax as her companion on the cancer journey.  The chaplain is present with the Pt. and Marjory Lies on speaker phone as the Pt. emotionally reflects on the significance of the spiritual care visit and naming her desire to focus on "joy" in her life.  The chaplain is available for F/U spiritual care as desired by the Pt.  Chaplain Sallyanne Kuster [phone]

## 2021-08-29 NOTE — Progress Notes (Signed)
Patient Care Team: Nicholas Lose, MD as PCP - General (Hematology and Oncology)  DIAGNOSIS: Admitted to the hospital with wound site infection related to fungating metastatic breast cancer in the chest wall.  Over the years she has had worsening of the fungating chest wall tumor.  Most of the time she is able to manage by herself with cleaning and dressing.  Few days ago she noticed a change in smell and she ran out of some of the dressing change material.  Because of concern for wound infection she came to the emergency room and was admitted to the hospital for IV antibiotic care.   SUMMARY OF ONCOLOGIC HISTORY: Oncology History  Breast cancer of upper-outer quadrant of right female breast ([place])  09/21/2010 Mammogram   right breast linear and segmental pleomorphic calcifications from 4:00 to 6:00 position ultrasound revealed 1.6 cm and 1.1 cm masses   09/27/2010 Initial Biopsy   ultrasound-guided biopsy of all masses showed DCIS grade 2; patient went to cancer treatment centers of [place] for second opinion and delayed therapy   01/26/2011 Surgery   right mastectomy followed by reconstruction: 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 But she could not afford these trips.   01/08/2014 Breast MRI   right breast multiple enhancing masses within the soft tissues largest 5.6 cm in wall skin surface and the capsule of the silicone prosthesis, contiguous nodules involving in inferomedial breast and tired and subcentimeter nodules across the midline   08/29/2015 Surgery    Right mastectomy: IDC with invol of skin and skin ulceration, breast capsule inv cancer, superior medial margin positive, 1/1 LN positive, grade 3, 14.3 cm, 3.5 cm, ALI, chest wall involv, ER 90-100%, PR 80-90%, HER-2 negative, Ki 67 40-50% T4CN1 (St 3B)   03/05/2016 -  Anti-estrogen oral therapy   Tamoxifen 20 mg daily stopped due to headache and uncontrolled hypertension. Decrease to 10 mg daily 04/03/2016, stopped June 2017 and took an estrogen metabolizer over-the-counter; started Aromasin April 2018 from a [place] physician, Zoladex started on 05/2017 and switched to Letrozole in 09/2017   01/04/2020 Surgery   Patient presented to the ED on 01/04/20 for worsening lower extremity numbness and underwent a decompressive laminectomy with tumor resection at T9 that was complicated with acute blood loss anemia and leukocytosis.   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  MEDICATIONS:  Current Facility-Administered Medications  Medication Dose Route Frequency Provider Last Rate Last Admin   (feeding supplement) PROSource Plus liquid 30 mL  30 mL Oral TID BM Pahwani, Ravi, MD   30 mL at 08/29/21 1302   acetaminophen (TYLENOL) tablet 650 mg  650  mg Oral Q6H PRN Karmen Bongo, MD   650 mg at 08/27/21 9509   Or   acetaminophen (TYLENOL) suppository 650 mg  650 mg Rectal Q6H PRN Karmen Bongo, MD       amLODipine (NORVASC) tablet 10 mg  10 mg Oral Daily Karmen Bongo, MD   10 mg at 08/29/21 0945   ascorbic acid (VITAMIN C) tablet 1,000 mg   1,000 mg Oral Daily Karmen Bongo, MD   1,000 mg at 08/29/21 0945   bisacodyl (DULCOLAX) suppository 10 mg  10 mg Rectal Daily PRN Karmen Bongo, MD   10 mg at 08/29/21 0347   calcium-vitamin D (OSCAL WITH D) 500-200 MG-UNIT per tablet 1 tablet  1 tablet Oral BID WC Karmen Bongo, MD   1 tablet at 08/29/21 3267   cholecalciferol (VITAMIN D3) tablet 5,000 Units  5,000 Units Oral BID Karmen Bongo, MD   5,000 Units at 08/29/21 0945   dexamethasone (DECADRON) tablet 1 mg  1 mg Oral Daily PRN Karmen Bongo, MD       diazepam (VALIUM) tablet 5 mg  5 mg Oral Q12H PRN Karmen Bongo, MD   5 mg at 08/28/21 2045   docusate sodium (COLACE) capsule 100 mg  100 mg Oral BID Karmen Bongo, MD   100 mg at 08/29/21 0945   enoxaparin (LOVENOX) injection 40 mg  40 mg Subcutaneous Q24H Karmen Bongo, MD   40 mg at 08/29/21 1302   feeding supplement (BOOST / RESOURCE BREEZE) liquid 1 Container  1 Container Oral TID BM Darliss Cheney, MD   1 Container at 08/29/21 0947   hydrALAZINE (APRESOLINE) injection 5 mg  5 mg Intravenous Q4H PRN Karmen Bongo, MD       lactated ringers infusion   Intravenous Continuous Karmen Bongo, MD 75 mL/hr at 08/29/21 0534 New Bag at 08/29/21 0534   letrozole [facility]) tablet 2.5 mg  2.5 mg Oral Daily Karmen Bongo, MD   2.5 mg at 08/29/21 0945   metroNIDAZOLE (METROGEL) 0.75 % gel   Topical Daily Rosezella Rumpf, NP   Given at 08/29/21 1245   morphine 2 MG/ML injection 2 mg  2 mg Intravenous Q4H PRN Knox Royalty, NP   2 mg at 08/29/21 0315   multivitamin with minerals tablet 1 tablet  1 tablet Oral Daily Darliss Cheney, MD   1 tablet at 08/29/21 0944   ondansetron (ZOFRAN) tablet 4 mg  4 mg Oral Q6H PRN Karmen Bongo, MD       Or   ondansetron [facility]) injection 4 mg  4 mg Intravenous Q6H PRN Karmen Bongo, MD       oxyCODONE (Oxy IR/ROXICODONE) immediate release tablet 10 mg  10 mg Oral Q3H PRN Karmen Bongo, MD   10 mg at 08/29/21 1302   oxyCODONE  (OXYCONTIN) 12 hr tablet 15 mg  15 mg Oral Q12H Knox Royalty, NP   15 mg at 08/29/21 8099   palbociclib (IBRANCE) tablet 125 mg  125 mg Oral Daily Karmen Bongo, MD   125 mg at 08/29/21 1521   polyethylene glycol (MIRALAX / GLYCOLAX) packet 17 g  17 g Oral Daily PRN Karmen Bongo, MD   17 g at 08/28/21 0954    PHYSICAL EXAMINATION: ECOG PERFORMANCE STATUS: 2 - Symptomatic, <50% confined to bed  Vitals:   08/29/21 0803 08/29/21 1557  BP: 121/85 103/69  Pulse: (!) 105 (!) 103  Resp: 18 19  Temp: 98 F (36.7 C) 98.6 F (37 C)  SpO2: 100%  100%   Filed Weights   08/25/21 2157  Weight: 132 lb 4.4 oz (60 kg)       LABORATORY DATA:  I have reviewed the data as listed CMP Latest Ref Rng & Units 08/29/2021 08/27/2021 08/25/2021  Glucose 70 - 99 mg/dL 119(H) 125(H) 109(H)  BUN 6 - 20 mg/dL 8 9 14   Creatinine 0.44 - 1.00 mg/dL 0.87 0.97 1.00  Sodium 135 - 145 mmol/L 136 137 138  Potassium 3.5 - 5.1 mmol/L 3.5 3.7 3.6  Chloride 98 - 111 mmol/L 100 103 103  CO2 22 - 32 mmol/L 29 27 25   Calcium 8.9 - 10.3 mg/dL 9.5 9.1 8.9  Total Protein 6.5 - 8.1 g/dL - - -  Total Bilirubin 0.3 - 1.2 mg/dL - - -  Alkaline Phos 38 - 126 U/L - - -  AST 15 - 41 U/L - - -  ALT 0 - 44 U/L - - -    Lab Results  Component Value Date   WBC 2.4 (L) 08/29/2021   HGB 10.4 (L) 08/29/2021   HCT 30.8 (L) 08/29/2021   MCV 96.0 08/29/2021   PLT 318 08/29/2021   NEUTROABS 1.8 08/29/2021    ASSESSMENT & PLAN:   Metastatic breast cancer: Slowly progressing on previous treatments.  Patient has many other treatment options but she has elected not to see them.  She wants to continue with antiestrogen therapy along with Zoladex injections and Ibrance. Wound infections: Appreciate wound care. Spinal cord compression: Previously radiated and had surgery as well.  She has made a remarkable improvement from inability to walk.  We will try to administer Zoladex injections so as to not to delay her treatment  plan.  The patient has a good understanding of the overall plan. she agrees with it. she will call with any problems that may develop before the next visit here. Total time spent: 30 mins including face to face time and time spent for planning, charting and co-ordination of care   Harriette Ohara, MD @T @

## 2021-08-29 NOTE — Progress Notes (Signed)
    [facility] for Infectious Disease    Date of Admission:  08/25/2021   Total days of antibiotics 2   ID: Misty Arnold is a 50 y.o. 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 Noticed decreased output with dressing changes +/- antibiotics. Abtx stopped last night. Having slightly more discharge this afternoon. No foul smelling drainage.  Medications:   (feeding supplement) PROSource Plus  30 mL Oral TID BM   amLODipine  10 mg Oral Daily   vitamin C  1,000 mg Oral Daily   calcium-vitamin D  1 tablet Oral BID WC   cholecalciferol  5,000 Units Oral BID   docusate sodium  100 mg Oral BID   enoxaparin (LOVENOX) injection  40 mg Subcutaneous Q24H   feeding supplement  1 Container Oral TID BM   goserelin  3.6 mg Subcutaneous Once   letrozole  2.5 mg Oral Daily   metroNIDAZOLE   Topical Daily   multivitamin with minerals  1 tablet Oral Daily   oxyCODONE  15 mg Oral Q12H   palbociclib  125 mg Oral Daily    Objective: Vital signs in last 24 hours: Temp:  [98 F (36.7 C)-98.8 F (37.1 C)] 98.5 F (36.9 C) (10/25 2054) Pulse Rate:  [85-108] 96 (10/25 2054) Resp:  [16-19] 16 (10/25 2054) BP: (103-121)/(68-85) 104/68 (10/25 2054) SpO2:  [98 %-100 %] 100 % (10/25 2054) Physical Exam  Constitutional:  oriented to person, place, and time. appears well-developed and well-nourished. No distress.  HENT: [place]/AT, PERRLA, no scleral icterus Mouth/Throat: Oropharynx is clear and moist. No oropharyngeal exudate.  Cardiovascular: Normal rate, regular rhythm and normal heart sounds. Exam reveals no gallop and no friction rub.  No murmur heard.  Pulmonary/Chest: Effort normal and breath sounds normal. No respiratory distress.   has no wheezes.  Chest wall = less maceration in wound bed Abdominal: Soft. Bowel sounds are normal.  exhibits no distension. There is no tenderness.  Lymphadenopathy: no cervical adenopathy. No axillary adenopathy Neurological: alert and oriented to person, place, and time.  Skin: Skin is warm and dry. No rash noted. No erythema.  Psychiatric: a normal mood and affect.  behavior is normal.    Lab Results Recent Labs    08/27/21 0100 08/29/21 0832  WBC 2.2* 2.4*  HGB 10.8* 10.4*  HCT 32.3* 30.8*  NA 137 136  K 3.7 3.5  CL 103 100  CO2 27 29  BUN 9 8  CREATININE 0.97 0.87   No results found for: [place]  Microbiology: none Studies/Results: No results found.   Assessment/Plan: Chronic chest wall wound = continue with wound care that includes silver alginate to help absorb drainage. Will plan to monitor off of abtx. Will arrange for follow up appt to see that still having good response since getting back on silver alginate, non adhesive dressing.   [facility] for Infectious Diseases Pager: [phone]  08/29/2021, 10:14 PM

## 2021-08-30 DIAGNOSIS — L089 Local infection of the skin and subcutaneous tissue, unspecified: Secondary | ICD-10-CM | POA: Diagnosis not present

## 2021-08-30 DIAGNOSIS — T148XXA Other injury of unspecified body region, initial encounter: Secondary | ICD-10-CM | POA: Diagnosis not present

## 2021-08-30 DIAGNOSIS — Z515 Encounter for palliative care: Secondary | ICD-10-CM | POA: Diagnosis not present

## 2021-08-30 LAB — CBC WITH DIFFERENTIAL/PLATELET
Abs Immature Granulocytes: 0 10*3/uL (ref 0.00–0.07)
Basophils Absolute: 0 10*3/uL (ref 0.0–0.1)
Basophils Relative: 3 %
Eosinophils Absolute: 0 10*3/uL (ref 0.0–0.5)
Eosinophils Relative: 2 %
HCT: 27.1 % — ABNORMAL LOW (ref 36.0–46.0)
Hemoglobin: 9 g/dL — ABNORMAL LOW (ref 12.0–15.0)
Lymphocytes Relative: 36 %
Lymphs Abs: 0.5 10*3/uL — ABNORMAL LOW (ref 0.7–4.0)
MCH: 32.1 pg (ref 26.0–34.0)
MCHC: 33.2 g/dL (ref 30.0–36.0)
MCV: 96.8 fL (ref 80.0–100.0)
Monocytes Absolute: 0.1 10*3/uL (ref 0.1–1.0)
Monocytes Relative: 5 %
Neutro Abs: 0.8 10*3/uL — ABNORMAL LOW (ref 1.7–7.7)
Neutrophils Relative %: 54 %
Platelets: 296 10*3/uL (ref 150–400)
RBC: 2.8 MIL/uL — ABNORMAL LOW (ref 3.87–5.11)
RDW: 15 % (ref 11.5–15.5)
WBC: 1.5 10*3/uL — ABNORMAL LOW (ref 4.0–10.5)
nRBC: 0 % (ref 0.0–0.2)
nRBC: 0 /100 WBC

## 2021-08-30 LAB — BASIC METABOLIC PANEL
Anion gap: 5 (ref 5–15)
BUN: 11 mg/dL (ref 6–20)
CO2: 29 mmol/L (ref 22–32)
Calcium: 8.9 mg/dL (ref 8.9–10.3)
Chloride: 105 mmol/L (ref 98–111)
Creatinine, Ser: 0.81 mg/dL (ref 0.44–1.00)
GFR, Estimated: >60 mL/min (ref >60)
Glucose, Bld: 120 mg/dL — ABNORMAL HIGH (ref 70–99)
Potassium: 3.6 mmol/L (ref 3.5–5.1)
Sodium: 139 mmol/L (ref 135–145)

## 2021-08-30 LAB — PATHOLOGIST SMEAR REVIEW

## 2021-08-30 MED ORDER — POLYETHYLENE GLYCOL 3350 17 G PO PACK
17.0000 g | PACK | Freq: Two times a day (BID) | ORAL | Status: DC
  Administered 2021-08-30 – 2021-09-05 (×12): 17 g via ORAL
  Filled 2021-08-30 (×12): qty 1

## 2021-08-30 NOTE — Progress Notes (Signed)
This chaplain followed up with Pt. Advance Directive education and completing of the Pt. AD.  The chaplain is present with the notary and two witnesses for the notarizing of the Pt. AD:  HCPOA.  The Pt. named [name], II as her healthcare agent.  If the healthcare agent is unable or unwilling to serve in this role the Pt. names [name].  The chaplain returned the original AD to the Pt. along with two copies.  A copy of the AD was scanned into the Pt. EMR.  Per the Pt. request, the chaplain understands the PMT-NP [name] will update the Pt. Goals of Care in [place].  This chaplain is available for F/U spiritual care as needed.  Chaplain Sallyanne Kuster [phone]

## 2021-08-30 NOTE — Progress Notes (Signed)
 Palliative Medicine Inpatient Follow Up Note  Consulting Provider: Yates, Jennifer, MD   Reason for consult:   Question Answer  Reason for Consult? breast cancer wound    HPI:  Per intake H&P --> Misty Arnold is a 50 y.o. female with medical history significant of HTN and metastatic breast cancer to bone presenting with wound infection.  She seemed to be doing ok since her surgery.  Dr. Poole said it would be 2 years before she could walk due to spinal mets and surgery.  The breast cancer just keeps coming back.  She had HHN but she has been taking care of the wound herself.  Dr. Gudena referred her to wound care.    Palliative care has been consulted for management of symptoms in the setting of metastatic breast cancer and a large exudative wound on her right chest from a necrotic mass.  Today's Discussion (08/30/2021):  *Please note that this is a verbal dictation therefore any spelling or grammatical errors are due to the "[employer]" system interpretation.  Chart reviewed. It appears per review with clinical pharmacist, Emma Ueland that patients pain continues to be better controlled since OxyContin was introduced.   I met with Misty Arnold at bedside this morning. She was sitting at the side of the bed. She is in good spirits this morning. She expresses that she is feeling fairly well this morning. She says that she has a few questions and would appreciate some coordination with reaching out to the MSW Michelle Zavala who had seen her in March of last year to further identify financial resources for a variety of things (lodging etc.) I stated that I would certainly e-mail her.  We reviewed the management of pain and constipation which appear to be working well at this time. The only difference is that Misty Arnold receives Miralax BID at home. She shares that bowels move more easily at home in the setting of her detox teas and coffee enemas.  From the perspective of wound care,  patients dressing has been changed appropriately and she will continue this at home and through the wound center. She is in a place where she feels her supplies will be provided properly.  In regards to psychological support. I shared that I plan to follow up with the resources at [place] to aid in seeing someone regularly to express her thoughts, fears, and emotions.  We reviewed the plan for continue care through out Palliative - Oncology clinic.   Advanced Directives will be completed this afternoon with our Palliative Chaplain, Ellen.   Questions and concerns addressed.  Palliative support provided.  Objective Assessment: Vital Signs Vitals:   08/30/21 0435 08/30/21 0802  BP: 101/68 117/78  Pulse: 87 85  Resp:  18  Temp: 98.9 F (37.2 C) 98 F (36.7 C)  SpO2: 100% 99%    Intake/Output Summary (Last 24 hours) at 08/30/2021 1307 Last data filed at 08/30/2021 0921 Gross per 24 hour  Intake 2008.68 ml  Output --  Net 2008.68 ml    Last Weight  Most recent update: 08/25/2021  9:57 PM    Weight  60 kg (132 lb 4.4 oz)            Gen:  Middle aged AA F in NAD HEENT: moist mucous membranes CV: Irregular rate and rhythm  PULM: On RA, breathing is even and non-labored ABD: (+) large wound R chest extending to abdomen, covered with dressing EXT: No edema  Neuro: Alert and oriented   x3   SUMMARY OF RECOMMENDATIONS   Full Code/ Full Scope of Care   Continue current measures to support care   Referral to OP Palliative Oncology clinic - Have spoken to Athena Cousar via telephone to make aware   Referral for anxiety cognitive behavioral therapy on discharge - will refer to psychologist Matt Schooler [phone] --> spoke over the phone today. Emailed Dr. Gudena to make a referral.   Symptom needs as below   Ongoing PMT support    Code Status/Advance Care Planning: FULL CODE   Symptom Management:  Generalized Pain from spine mets: - Oxycontin 15mg PO Q12H -  Continue oxycodone 10mg PO Q3H PRN - Continue morphine 2mg IV Q2H PRN for breakthrough pain   Anxiety & Stress: - Would benefit from cognitive behavioral therapy as does not wish to take medications for this. Will refer for cognitive behavioral therapy. - Patient utilizes aromatherapy oils and detoxifying foot therapy on her own   Bowel Regularity: - Continue Miralax 17g PO BID - Patient will continue coffee enemas on DC   Wound Care: - Appreciate wound care consult  - metronidazole 0.75 % gel daily x 14 days total   Muscular Weakness: - PT/OT   Nutrition: - Dietary consult - Receiving decadron 1mg PO PRN   Muscle Spasms: - Continue valium 5mg PO Q12H PRN   Social: - Patient is interested in moving closer to her doctors but lives on a fixed income. Appreciate MSW to help navigate the possibilities.   Emotional Support: - Patient is a member of a support group though it is limited in her small town - Will provide additional resources for support groups of woman contending with breast cancer   Advance Care Planning: - Appreciate Chaplain to help complete advance directives  Time Spent: 35 minutes Greater than 50% of the time was spent in counseling and coordination of care ______________________________________________________________________________________ Michelle Ferolito [place] Palliative Medicine Team Team Cell Phone: [phone] Please utilize secure chat with additional questions, if there is no response within 30 minutes please call the above phone number  Palliative Medicine Team providers are available by phone from 7am to 7pm daily and can be reached through the team cell phone.  Should this patient require assistance outside of these hours, please call the patient's attending physician.     

## 2021-08-30 NOTE — Progress Notes (Signed)
Mobility Specialist Progress Note:   08/30/21 1200  Mobility  Activity Ambulated in hall  Level of Assistance Modified independent, requires aide device or extra time  Assistive Device Four point cane  Distance Ambulated (ft) 550 ft  Mobility Ambulated independently in hallway  Mobility Response Tolerated well  Mobility performed by Mobility specialist  $Mobility charge 1 Mobility   Pt asx during ambulation. Pt stated she was focusing on heel toe with her R foot, d/t great toe usually being flexed during amb.   Nelta Numbers Mobility Specialist  Phone [phone]

## 2021-08-30 NOTE — Progress Notes (Signed)
PROGRESS NOTE    Misty Arnold  NID:[id number] DOB: [birth year] DOA: 08/25/2021 PCP: Nicholas Lose, MD  Outpatient Specialists:   Brief Narrative:  Misty Arnold is a 50 y.o. female with medical history significant of HTN and metastatic breast cancer to bone presented with worsening right chest wall wound.  She seemed to be doing ok since her surgery.  Dr. Trenton Gammon said it would be 2 years before she could walk due to spinal mets and surgery.  Dr. Lindi Adie referred her to wound care.  Wound care sent her all the supplies she needed the first time.  She required more supplies than were provided through Medicare.  2 weeks ago, she needed new supplies and the box came without everything needed.  When she took off the ABD pads it would stick to the wound which was causing bleeding and irritation.  She tried to get more supplies without success - Medicare wouldn't provide the supplies.  She started noticing a color change in her fluid and decided to come to the ER.  The fluid amount is increasing, needs to change ABD pads q5h.  It was previously light yellow and is now light green.  Dr. Geralyn Flash nurse recommended that she come to the ER.  She has had multiple surgeries.  In 2016, she had a TRAM procedure.  Assessment & Plan:   Principal Problem:   Infected wound Active Problems:   Breast cancer of upper-outer quadrant of right female breast ([place])   Metastasis of neoplasm to spinal canal [facility])   Essential hypertension   Breast wound    wound of the right breast/chest wall -Patient with chest wall chronic wound associated with malignancy -Patient was started on IV vancomycin, IV cefepime and metronidazole gel.  Wound care consulted.  Wound care resumed her outpatient wound dressings.  Due to concern of the need of debridement, general surgery was consulted however they opined that debridement may cause further worsening of wound which may not heal and that she does not need any debridement at this  point in time.  ID was consulted who opined that patient's wound does not seem to have any sort of superinfection or bacterial infection and decided to discontinue antibiotics and watch.  Antibiotics discontinued on the evening of 08/28/2021.  If she were to develop fever, purulent discharge or erythema of the wound then she will be candidate for antibiotics.  Patient tells me that she feels like her drainage is slightly increased compared to yesterday.  No fever though.  We will watch overnight.  If no change in drainage and no fever, possible discharge tomorrow.  Patient agreeable with this plan.   Metastatic breast cancer -She refused radiation treatment but did undergo limited palliative rads tx after spinal meds -She is currently on Zoladex, Ibrance, and Letrozole -Palliative care input is appreciated.  She was seen by chaplain as well. -She is a full code at this time Pain is controlled.  She receives Zoladex on 08/29/2021 as inpatient.   HTN -Controlled on Norvasc.    DVT prophylaxis: Subcutaneous Lovenox Code Status: Full code Family Communication: Her caregiver over the phone. Disposition Plan: Home eventually   Consultants:  General surgery ID  Procedures:  None  Antimicrobials:  IV vancomycin IV cefepime Metronidazole gel   Subjective:  Patient seen and examined.  She has no new complaint, she feels like her drainage from the wound is slightly increased.  Objective: Vitals:   08/29/21 1952 08/29/21 2054 08/30/21 0435 08/30/21 0802  BP: 119/84  104/68 101/68 117/78  Pulse: (!) 108 96 87 85  Resp:  16  18  Temp: 98.8 F (37.1 C) 98.5 F (36.9 C) 98.9 F (37.2 C) 98 F (36.7 C)  TempSrc: Oral Oral Oral Oral  SpO2: 98% 100% 100% 99%  Weight:      Height:        Intake/Output Summary (Last 24 hours) at 08/30/2021 1101 Last data filed at 08/30/2021 2426 Gross per 24 hour  Intake 2008.68 ml  Output --  Net 2008.68 ml    Filed Weights   08/25/21 2157   Weight: 60 kg    Examination:  General exam: Appears calm and comfortable  Respiratory system: Clear to auscultation. Respiratory effort normal. Cardiovascular system: S1 & S2 heard, RRR. No JVD, murmurs, rubs, gallops or clicks. No pedal edema. Gastrointestinal system: Abdomen is nondistended, soft and nontender. No organomegaly or masses felt. Normal bowel sounds heard. Central nervous system: Alert and oriented. No focal neurological deficits. Extremities: Symmetric 5 x 5 power. Skin: As below in pictures Psychiatry: Judgement and insight appear normal. Mood & affect appropriate.          Data Reviewed: I have personally reviewed following labs and imaging studies  CBC: Recent Labs  Lab 08/25/21 2236 08/27/21 0100 08/29/21 0832 08/30/21 0209  WBC 2.4* 2.2* 2.4* 1.5*  NEUTROABS 1.3*  --  1.8 0.8*  HGB 11.1* 10.8* 10.4* 9.0*  HCT 33.0* 32.3* 30.8* 27.1*  MCV 97.6 96.4 96.0 96.8  PLT 279 275 318 834    Basic Metabolic Panel: Recent Labs  Lab 08/25/21 2236 08/27/21 0100 08/29/21 0832 08/30/21 0209  NA 138 137 136 139  K 3.6 3.7 3.5 3.6  CL 103 103 100 105  CO2 25 27 29 29   GLUCOSE 109* 125* 119* 120*  BUN 14 9 8 11   CREATININE 1.00 0.97 0.87 0.81  CALCIUM 8.9 9.1 9.5 8.9  MG  --   --  2.1  --     GFR: Estimated Creatinine Clearance: 73.1 mL/min (by C-G formula based on SCr of 0.81 mg/dL). Liver Function Tests: No results for input(s): AST, ALT, ALKPHOS, BILITOT, PROT, ALBUMIN in the last 168 hours. No results for input(s): LIPASE, AMYLASE in the last 168 hours. No results for input(s): AMMONIA in the last 168 hours. Coagulation Profile: No results for input(s): INR, PROTIME in the last 168 hours. Cardiac Enzymes: No results for input(s): CKTOTAL, CKMB, CKMBINDEX, TROPONINI in the last 168 hours. BNP (last 3 results) No results for input(s): PROBNP in the last 8760 hours. HbA1C: No results for input(s): HGBA1C in the last 72 hours. CBG: No results  for input(s): GLUCAP in the last 168 hours. Lipid Profile: No results for input(s): CHOL, HDL, LDLCALC, TRIG, CHOLHDL, LDLDIRECT in the last 72 hours. Thyroid Function Tests: No results for input(s): TSH, T4TOTAL, FREET4, T3FREE, THYROIDAB in the last 72 hours. Anemia Panel: No results for input(s): VITAMINB12, FOLATE, FERRITIN, TIBC, IRON, RETICCTPCT in the last 72 hours. Urine analysis:    Component Value Date/Time   COLORURINE YELLOW 05/11/2021 2323   APPEARANCEUR CLEAR 05/11/2021 2323   LABSPEC 1.020 05/11/2021 2323   PHURINE 5.0 05/11/2021 2323   GLUCOSEU NEGATIVE 05/11/2021 2323   HGBUR NEGATIVE 05/11/2021 2323   BILIRUBINUR NEGATIVE 05/11/2021 2323   KETONESUR 5 (A) 05/11/2021 2323   PROTEINUR NEGATIVE 05/11/2021 2323   NITRITE NEGATIVE 05/11/2021 2323   LEUKOCYTESUR NEGATIVE 05/11/2021 2323   Sepsis Labs: @LABRCNTIP (procalcitonin:4,lacticidven:4)  ) Recent Results (from the past 240  hour(s))  Resp Panel by RT-PCR (Flu A&B, Covid) Nasopharyngeal Swab     Status: None   Collection Time: 08/26/21  8:10 AM   Specimen: Nasopharyngeal Swab; Nasopharyngeal(NP) swabs in vial transport medium  Result Value Ref Range Status   SARS Coronavirus 2 by RT PCR NEGATIVE NEGATIVE Final    Comment: (NOTE) SARS-CoV-2 target nucleic acids are NOT DETECTED.  The SARS-CoV-2 RNA is generally detectable in upper respiratory specimens during the acute phase of infection. The lowest concentration of SARS-CoV-2 viral copies this assay can detect is 138 copies/mL. A negative result does not preclude SARS-Cov-2 infection and should not be used as the sole basis for treatment or other patient management decisions. A negative result may occur with  improper specimen collection/handling, submission of specimen other than nasopharyngeal swab, presence of viral mutation(s) within the areas targeted by this assay, and inadequate number of viral copies(<138 copies/mL). A negative result must be  combined with clinical observations, patient history, and epidemiological information. The expected result is Negative.  Fact Sheet for Patients:  EntrepreneurPulse.com.au  Fact Sheet for Healthcare Providers:  IncredibleEmployment.be  This test is no t yet approved or cleared by the [place] FDA and  has been authorized for detection and/or diagnosis of SARS-CoV-2 by FDA under an Emergency Use Authorization (EUA). This EUA will remain  in effect (meaning this test can be used) for the duration of the COVID-19 declaration under Section 564(b)(1) of the Act, 21 U.S.C.section 360bbb-3(b)(1), unless the authorization is terminated  or revoked sooner.       Influenza A by PCR NEGATIVE NEGATIVE Final   Influenza B by PCR NEGATIVE NEGATIVE Final    Comment: (NOTE) The Xpert Xpress SARS-CoV-2/FLU/RSV plus assay is intended as an aid in the diagnosis of influenza from Nasopharyngeal swab specimens and should not be used as a sole basis for treatment. Nasal washings and aspirates are unacceptable for Xpert Xpress SARS-CoV-2/FLU/RSV testing.  Fact Sheet for Patients: EntrepreneurPulse.com.au  Fact Sheet for Healthcare Providers: IncredibleEmployment.be  This test is not yet approved or cleared by the [place] FDA and has been authorized for detection and/or diagnosis of SARS-CoV-2 by FDA under an Emergency Use Authorization (EUA). This EUA will remain in effect (meaning this test can be used) for the duration of the COVID-19 declaration under Section 564(b)(1) of the Act, 21 U.S.C. section 360bbb-3(b)(1), unless the authorization is terminated or revoked.  Performed at [facility], [place] [address], [place], [place] [address]     Radiology Studies: No results found.   Scheduled Meds:  (feeding supplement) PROSource Plus  30 mL Oral TID BM   amLODipine  10 mg Oral Daily   vitamin C  1,000  mg Oral Daily   calcium-vitamin D  1 tablet Oral BID WC   cholecalciferol  5,000 Units Oral BID   docusate sodium  100 mg Oral BID   enoxaparin (LOVENOX) injection  40 mg Subcutaneous Q24H   feeding supplement  1 Container Oral TID BM   goserelin  3.6 mg Subcutaneous Once   letrozole  2.5 mg Oral Daily   metroNIDAZOLE   Topical Daily   multivitamin with minerals  1 tablet Oral Daily   oxyCODONE  15 mg Oral Q12H   palbociclib  125 mg Oral Daily   Continuous Infusions:  lactated ringers 75 mL/hr at 08/30/21 0722     LOS: 2 days   Time spent: 28 minutes Darliss Cheney, MD  [facility] 7PM-7AM contact night coverage as  above

## 2021-08-31 ENCOUNTER — Telehealth: Payer: Self-pay | Admitting: *Deleted

## 2021-08-31 DIAGNOSIS — L089 Local infection of the skin and subcutaneous tissue, unspecified: Secondary | ICD-10-CM | POA: Diagnosis not present

## 2021-08-31 DIAGNOSIS — T148XXA Other injury of unspecified body region, initial encounter: Secondary | ICD-10-CM | POA: Diagnosis not present

## 2021-08-31 LAB — CBC WITH DIFFERENTIAL/PLATELET
Abs Immature Granulocytes: 0 10*3/uL (ref 0.00–0.07)
Basophils Absolute: 0.1 10*3/uL (ref 0.0–0.1)
Basophils Relative: 4 %
Eosinophils Absolute: 0 10*3/uL (ref 0.0–0.5)
Eosinophils Relative: 1 %
HCT: 28.7 % — ABNORMAL LOW (ref 36.0–46.0)
Hemoglobin: 9.6 g/dL — ABNORMAL LOW (ref 12.0–15.0)
Lymphocytes Relative: 17 %
Lymphs Abs: 0.4 10*3/uL — ABNORMAL LOW (ref 0.7–4.0)
MCH: 32.4 pg (ref 26.0–34.0)
MCHC: 33.4 g/dL (ref 30.0–36.0)
MCV: 97 fL (ref 80.0–100.0)
Monocytes Absolute: 0.1 10*3/uL (ref 0.1–1.0)
Monocytes Relative: 3 %
Neutro Abs: 1.9 10*3/uL (ref 1.7–7.7)
Neutrophils Relative %: 75 %
Platelets: 350 10*3/uL (ref 150–400)
RBC: 2.96 MIL/uL — ABNORMAL LOW (ref 3.87–5.11)
RDW: 15.4 % (ref 11.5–15.5)
WBC: 2.5 10*3/uL — ABNORMAL LOW (ref 4.0–10.5)
nRBC: 0 % (ref 0.0–0.2)
nRBC: 0 /100 WBC

## 2021-08-31 LAB — BASIC METABOLIC PANEL
Anion gap: 7 (ref 5–15)
BUN: 13 mg/dL (ref 6–20)
CO2: 26 mmol/L (ref 22–32)
Calcium: 8.7 mg/dL — ABNORMAL LOW (ref 8.9–10.3)
Chloride: 105 mmol/L (ref 98–111)
Creatinine, Ser: 0.77 mg/dL (ref 0.44–1.00)
GFR, Estimated: >60 mL/min (ref >60)
Glucose, Bld: 125 mg/dL — ABNORMAL HIGH (ref 70–99)
Potassium: 3.4 mmol/L — ABNORMAL LOW (ref 3.5–5.1)
Sodium: 138 mmol/L (ref 135–145)

## 2021-08-31 MED ORDER — POTASSIUM CHLORIDE CRYS ER 20 MEQ PO TBCR
40.0000 meq | EXTENDED_RELEASE_TABLET | Freq: Once | ORAL | Status: AC
  Administered 2021-08-31: 40 meq via ORAL
  Filled 2021-08-31: qty 2

## 2021-08-31 MED ORDER — VANCOMYCIN HCL 750 MG/150ML IV SOLN
750.0000 mg | Freq: Two times a day (BID) | INTRAVENOUS | Status: DC
  Administered 2021-09-01 – 2021-09-03 (×5): 750 mg via INTRAVENOUS
  Filled 2021-08-31 (×5): qty 150

## 2021-08-31 MED ORDER — VANCOMYCIN HCL 1250 MG/250ML IV SOLN
1250.0000 mg | Freq: Once | INTRAVENOUS | Status: AC
  Administered 2021-08-31: 1250 mg via INTRAVENOUS
  Filled 2021-08-31: qty 250

## 2021-08-31 MED ORDER — SODIUM CHLORIDE 0.9 % IV SOLN
2.0000 g | Freq: Three times a day (TID) | INTRAVENOUS | Status: DC
  Administered 2021-08-31 – 2021-09-03 (×8): 2 g via INTRAVENOUS
  Filled 2021-08-31 (×8): qty 2

## 2021-08-31 NOTE — Progress Notes (Addendum)
PROGRESS NOTE    Misty Arnold  AQT:[id number] DOB: 09/04/1971 DOA: 08/25/2021 PCP: Nicholas Lose, MD  Outpatient Specialists:   Brief Narrative:  Misty Arnold is a 50 y.o. female with medical history significant of HTN and metastatic breast cancer to bone presented with worsening right chest wall wound.  She seemed to be doing ok since her surgery.  Dr. Trenton Gammon said it would be 2 years before she could walk due to spinal mets and surgery.  Dr. Lindi Adie referred her to wound care.  Wound care sent her all the supplies she needed the first time.  She required more supplies than were provided through Medicare.  2 weeks ago, she needed new supplies and the box came without everything needed.  When she took off the ABD pads it would stick to the wound which was causing bleeding and irritation.  She tried to get more supplies without success - Medicare wouldn't provide the supplies.  She started noticing a color change in her fluid and decided to come to the ER.  The fluid amount is increasing, needs to change ABD pads q5h.  It was previously light yellow and is now light green.  Dr. Geralyn Flash nurse recommended that she come to the ER.  She has had multiple surgeries.  In 2016, she had a TRAM procedure.  Assessment & Plan:   Principal Problem:   Infected wound Active Problems:   Breast cancer of upper-outer quadrant of right female breast ([place])   Metastasis of neoplasm to spinal canal [facility])   Essential hypertension   Breast wound    wound of the right breast/chest wall -Patient with chest wall chronic wound associated with malignancy -Patient was started on IV vancomycin, IV cefepime and metronidazole gel.  Wound care consulted.  Wound care resumed her outpatient wound dressings.  Due to concern of the need of debridement, general surgery was consulted however they opined that debridement may cause further worsening of wound which may not heal and that she does not need any debridement at this  point in time.  ID was consulted who opined that patient's wound does not seem to have any sort of superinfection or bacterial infection and decided to discontinue antibiotics and watch.  Antibiotics discontinued on the evening of 08/28/2021.  If she were to develop fever, purulent discharge or erythema of the wound then she will be candidate for antibiotics.  Patient has not had any fever, leukocytosis however patient believes that her drainage has increased and now it has turned to be greenish in color.  She is not comfortable going home.  When I went to see her, she was getting ready to eat her breakfast and did not want to have the dressing changed so I could personally visualize.   Metastatic breast cancer -She refused radiation treatment but did undergo limited palliative rads tx after spinal meds -She is currently on Zoladex, Ibrance, and Letrozole -Palliative care input is appreciated.  She was seen by chaplain as well. -She is a full code at this time Pain is controlled.  She receives Zoladex on 08/29/2021 as inpatient.   HTN -Controlled on Norvasc.    Hypokalemia: 3.4.  Will replenish.  DVT prophylaxis: Subcutaneous Lovenox Code Status: Full code Family Communication: Her caregiver over the phone. Disposition Plan: Home eventually   Consultants:  General surgery ID  Procedures:  None  Antimicrobials:  IV vancomycin IV cefepime Metronidazole gel   Subjective:  Seen and examined.  Patient was comfortable and walking in the room  and was getting ready to sit in the chair to eat her breakfast.  She claimed that yesterday, her dressing change was delayed for several hours and was done at 7 PM in the evening, according to her, she has now more greenish colored drainage.  She is not comfortable going home until her drainage improves.  Objective: Vitals:   08/30/21 0802 08/30/21 1820 08/31/21 0500 08/31/21 0808  BP: 117/78 120/66 130/75 124/84  Pulse: 85 81 68 88  Resp: 18  19 17 20   Temp: 98 F (36.7 C) 98.3 F (36.8 C) 98.2 F (36.8 C) (!) 97.5 F (36.4 C)  TempSrc: Oral Oral Oral Oral  SpO2: 99% 100% 99% 100%  Weight:      Height:        Intake/Output Summary (Last 24 hours) at 08/31/2021 1325 Last data filed at 08/31/2021 1045 Gross per 24 hour  Intake 900 ml  Output --  Net 900 ml    Filed Weights   08/25/21 2157  Weight: 60 kg    Examination:  General exam: Appears calm and comfortable  Respiratory system: Clear to auscultation. Respiratory effort normal. Cardiovascular system: S1 & S2 heard, RRR. No JVD, murmurs, rubs, gallops or clicks. No pedal edema. Gastrointestinal system: Abdomen is nondistended, soft and nontender. No organomegaly or masses felt. Normal bowel sounds heard. Central nervous system: Alert and oriented. No focal neurological deficits. Extremities: Symmetric 5 x 5 power. Skin: As below. Psychiatry: Judgement and insight appear normal. Mood & affect appropriate.        Data Reviewed: I have personally reviewed following labs and imaging studies  CBC: Recent Labs  Lab 08/25/21 2236 08/27/21 0100 08/29/21 0832 08/30/21 0209 08/31/21 0055  WBC 2.4* 2.2* 2.4* 1.5* 2.5*  NEUTROABS 1.3*  --  1.8 0.8* 1.9  HGB 11.1* 10.8* 10.4* 9.0* 9.6*  HCT 33.0* 32.3* 30.8* 27.1* 28.7*  MCV 97.6 96.4 96.0 96.8 97.0  PLT 279 275 318 296 161    Basic Metabolic Panel: Recent Labs  Lab 08/25/21 2236 08/27/21 0100 08/29/21 0832 08/30/21 0209 08/31/21 0055  NA 138 137 136 139 138  K 3.6 3.7 3.5 3.6 3.4*  CL 103 103 100 105 105  CO2 25 27 29 29 26   GLUCOSE 109* 125* 119* 120* 125*  BUN 14 9 8 11 13   CREATININE 1.00 0.97 0.87 0.81 0.77  CALCIUM 8.9 9.1 9.5 8.9 8.7*  MG  --   --  2.1  --   --     GFR: Estimated Creatinine Clearance: 74 mL/min (by C-G formula based on SCr of 0.77 mg/dL). Liver Function Tests: No results for input(s): AST, ALT, ALKPHOS, BILITOT, PROT, ALBUMIN in the last 168 hours. No results for  input(s): LIPASE, AMYLASE in the last 168 hours. No results for input(s): AMMONIA in the last 168 hours. Coagulation Profile: No results for input(s): INR, PROTIME in the last 168 hours. Cardiac Enzymes: No results for input(s): CKTOTAL, CKMB, CKMBINDEX, TROPONINI in the last 168 hours. BNP (last 3 results) No results for input(s): PROBNP in the last 8760 hours. HbA1C: No results for input(s): HGBA1C in the last 72 hours. CBG: No results for input(s): GLUCAP in the last 168 hours. Lipid Profile: No results for input(s): CHOL, HDL, LDLCALC, TRIG, CHOLHDL, LDLDIRECT in the last 72 hours. Thyroid Function Tests: No results for input(s): TSH, T4TOTAL, FREET4, T3FREE, THYROIDAB in the last 72 hours. Anemia Panel: No results for input(s): VITAMINB12, FOLATE, FERRITIN, TIBC, IRON, RETICCTPCT in the last 72  hours. Urine analysis:    Component Value Date/Time   COLORURINE YELLOW 05/11/2021 2323   APPEARANCEUR CLEAR 05/11/2021 2323   LABSPEC 1.020 05/11/2021 2323   PHURINE 5.0 05/11/2021 2323   GLUCOSEU NEGATIVE 05/11/2021 2323   HGBUR NEGATIVE 05/11/2021 2323   BILIRUBINUR NEGATIVE 05/11/2021 2323   KETONESUR 5 (A) 05/11/2021 2323   PROTEINUR NEGATIVE 05/11/2021 2323   NITRITE NEGATIVE 05/11/2021 2323   LEUKOCYTESUR NEGATIVE 05/11/2021 2323   Sepsis Labs: @LABRCNTIP (procalcitonin:4,lacticidven:4)  ) Recent Results (from the past 240 hour(s))  Resp Panel by RT-PCR (Flu A&B, Covid) Nasopharyngeal Swab     Status: None   Collection Time: 08/26/21  8:10 AM   Specimen: Nasopharyngeal Swab; Nasopharyngeal(NP) swabs in vial transport medium  Result Value Ref Range Status   SARS Coronavirus 2 by RT PCR NEGATIVE NEGATIVE Final    Comment: (NOTE) SARS-CoV-2 target nucleic acids are NOT DETECTED.  The SARS-CoV-2 RNA is generally detectable in upper respiratory specimens during the acute phase of infection. The lowest concentration of SARS-CoV-2 viral copies this assay can detect is 138  copies/mL. A negative result does not preclude SARS-Cov-2 infection and should not be used as the sole basis for treatment or other patient management decisions. A negative result may occur with  improper specimen collection/handling, submission of specimen other than nasopharyngeal swab, presence of viral mutation(s) within the areas targeted by this assay, and inadequate number of viral copies(<138 copies/mL). A negative result must be combined with clinical observations, patient history, and epidemiological information. The expected result is Negative.  Fact Sheet for Patients:  EntrepreneurPulse.com.au  Fact Sheet for Healthcare Providers:  IncredibleEmployment.be  This test is no t yet approved or cleared by the [place] FDA and  has been authorized for detection and/or diagnosis of SARS-CoV-2 by FDA under an Emergency Use Authorization (EUA). This EUA will remain  in effect (meaning this test can be used) for the duration of the COVID-19 declaration under Section 564(b)(1) of the Act, 21 U.S.C.section 360bbb-3(b)(1), unless the authorization is terminated  or revoked sooner.       Influenza A by PCR NEGATIVE NEGATIVE Final   Influenza B by PCR NEGATIVE NEGATIVE Final    Comment: (NOTE) The Xpert Xpress SARS-CoV-2/FLU/RSV plus assay is intended as an aid in the diagnosis of influenza from Nasopharyngeal swab specimens and should not be used as a sole basis for treatment. Nasal washings and aspirates are unacceptable for Xpert Xpress SARS-CoV-2/FLU/RSV testing.  Fact Sheet for Patients: EntrepreneurPulse.com.au  Fact Sheet for Healthcare Providers: IncredibleEmployment.be  This test is not yet approved or cleared by the [place] FDA and has been authorized for detection and/or diagnosis of SARS-CoV-2 by FDA under an Emergency Use Authorization (EUA). This EUA will remain in effect (meaning  this test can be used) for the duration of the COVID-19 declaration under Section 564(b)(1) of the Act, 21 U.S.C. section 360bbb-3(b)(1), unless the authorization is terminated or revoked.  Performed at [facility], [place] [address], [place], [place] [address]     Radiology Studies: No results found.   Scheduled Meds:  (feeding supplement) PROSource Plus  30 mL Oral TID BM   amLODipine  10 mg Oral Daily   vitamin C  1,000 mg Oral Daily   calcium-vitamin D  1 tablet Oral BID WC   cholecalciferol  5,000 Units Oral BID   docusate sodium  100 mg Oral BID   enoxaparin (LOVENOX) injection  40 mg Subcutaneous Q24H   feeding supplement  1  Container Oral TID BM   letrozole  2.5 mg Oral Daily   metroNIDAZOLE   Topical Daily   multivitamin with minerals  1 tablet Oral Daily   oxyCODONE  15 mg Oral Q12H   palbociclib  125 mg Oral Daily   polyethylene glycol  17 g Oral BID   Continuous Infusions:     LOS: 3 days   Time spent: 26 minutes Darliss Cheney, MD  [facility] 7PM-7AM contact night coverage as above

## 2021-08-31 NOTE — Progress Notes (Signed)
ID PROGRESS NOTE  24hr - more drainage output in the last 24-48hrs without abtx.  S: afebrile but more wound drainage,  O: BP 124/84 (BP Location: Left Arm)   Pulse 88   Temp (!) 97.5 F (36.4 C) (Oral)   Resp 20   Ht 5\' 5"  (1.651 m)   Wt 60 kg   SpO2 100%   BMI 22.01 kg/m  Skin = serous drainage, and maceration and exudate.  A/P: - will restart iv abtx then transitioning to orals for home - continue with wound care, would recommend less metronidazole ointment  Ulonda Klosowski B. [facility] [phone]

## 2021-08-31 NOTE — Care Management Important Message (Signed)
Important Message  Patient Details  Name: Misty Arnold MRN: [id number] Date of Birth: Jul 15, 1971   Medicare Important Message Given:  Yes     Kaylee Trivett [address] 08/31/2021, 2:46 PM

## 2021-08-31 NOTE — Progress Notes (Signed)
Pharmacy Antibiotic Note  Misty Arnold is a 50 y.o. female admitted on 08/25/2021 with cellulitis.   ID following and plans restart iv abtx then transitioning to orals for home.  Pharmacy has been consulted 10/27 to restart  vancomycin and cefepime dosing for wound infection.  59 yof with a history of  HTN and metastatic breast cancer to bone presenting with wound infection.   SCr 0.77 (Stable),  CrCl 74 ml/min   Plan: Restart Cefepime to 2g q8h  Restart Vancomycin 1250 mg  x1 then 750 mg IV q12h (eAUC 527), Scr used 0.8 Trend WBC, fever, renal function Levels at steady state De-escalate when able  Height: 5\' 5"  (165.1 cm) Weight: 60 kg (132 lb 4.4 oz) IBW/kg (Calculated) : 57  Temp (24hrs), Avg:98.3 F (36.8 C), Min:97.5 F (36.4 C), Max:99 F (37.2 C)  Recent Labs  Lab 08/25/21 2236 08/27/21 0100 08/29/21 0832 08/30/21 0209 08/31/21 0055  WBC 2.4* 2.2* 2.4* 1.5* 2.5*  CREATININE 1.00 0.97 0.87 0.81 0.77  LATICACIDVEN 1.2  --   --   --   --      Estimated Creatinine Clearance: 74 mL/min (by C-G formula based on SCr of 0.77 mg/dL).    Allergies  Allergen Reactions   Amoxicillin     Other reaction(s): rash/itching   Penicillins Rash    Did it involve swelling of the face/tongue/throat, SOB, or low BP? no Did it involve sudden or severe rash/hives, skin peeling, or any reaction on the inside of your mouth or nose? Yes Did you need to seek medical attention at a hospital or doctor's office? No When did it last happen? early 20's     If all above answers are "NO", may proceed with cephalosporin use.   Pork-Derived Products    Penicillin G    Tamoxifen Hypertension    Thank you for allowing pharmacy to be a part of this patient's care.  Nicole Cella, RPh Clinical Pharmacist  Please refer to [facility] for unit-specific pharmacist   08/31/2021 5:20 PM

## 2021-08-31 NOTE — Progress Notes (Signed)
Mobility Specialist Progress Note:   08/31/21 1210  Mobility  Activity Ambulated in hall  Level of Assistance Independent  Assistive Device Four point cane  Distance Ambulated (ft) 550 ft  Mobility Ambulated independently in hallway  Mobility Response Tolerated well  Mobility performed by Mobility specialist  $Mobility charge 1 Mobility   Pt asx during ambulation. Left sitting EOB. Encouraged amb this afternoon. Will see again today if schedule permits.   Nelta Numbers Mobility Specialist  Phone [phone]

## 2021-08-31 NOTE — Plan of Care (Signed)

## 2021-08-31 NOTE — Telephone Encounter (Signed)
Received call from pt requesting to update MD on current hospitalization.  Pt states chest wound is beginning to develop a green discharge and she is experiencing severe 15/10 back pain. Pt states she has alerted her Neurosurgeon Dr. [name] regarding severe back pain and waiting for response from his office.  Pt also states wound care is addressing the green discharge with her chest wound.  Pt states she is not stable at this time for discharge and would like to update Oncologist.  RN will notify MD.

## 2021-08-31 NOTE — Progress Notes (Signed)
Mobility Specialist Progress Note:   08/31/21 1600  Mobility  Activity Ambulated in hall  Level of Assistance Independent  Assistive Device Four point cane  Distance Ambulated (ft) 550 ft  Mobility Ambulated independently in hallway  Mobility Response Tolerated well  Mobility performed by Mobility specialist  $Mobility charge 1 Mobility   2nd session with pt today. Pt asx during ambulation.   Nelta Numbers Mobility Specialist  Phone [phone]

## 2021-09-01 DIAGNOSIS — T148XXA Other injury of unspecified body region, initial encounter: Secondary | ICD-10-CM | POA: Diagnosis not present

## 2021-09-01 DIAGNOSIS — L089 Local infection of the skin and subcutaneous tissue, unspecified: Secondary | ICD-10-CM | POA: Diagnosis not present

## 2021-09-01 LAB — BASIC METABOLIC PANEL
Anion gap: 4 — ABNORMAL LOW (ref 5–15)
BUN: 16 mg/dL (ref 6–20)
CO2: 26 mmol/L (ref 22–32)
Calcium: 9 mg/dL (ref 8.9–10.3)
Chloride: 105 mmol/L (ref 98–111)
Creatinine, Ser: 0.66 mg/dL (ref 0.44–1.00)
GFR, Estimated: >60 mL/min (ref >60)
Glucose, Bld: 111 mg/dL — ABNORMAL HIGH (ref 70–99)
Potassium: 3.9 mmol/L (ref 3.5–5.1)
Sodium: 135 mmol/L (ref 135–145)

## 2021-09-01 LAB — CBC WITH DIFFERENTIAL/PLATELET
Abs Immature Granulocytes: 0 10*3/uL (ref 0.00–0.07)
Basophils Absolute: 0 10*3/uL (ref 0.0–0.1)
Basophils Relative: 1 %
Eosinophils Absolute: 0 10*3/uL (ref 0.0–0.5)
Eosinophils Relative: 0 %
HCT: 28.4 % — ABNORMAL LOW (ref 36.0–46.0)
Hemoglobin: 9.6 g/dL — ABNORMAL LOW (ref 12.0–15.0)
Lymphocytes Relative: 13 %
Lymphs Abs: 0.4 10*3/uL — ABNORMAL LOW (ref 0.7–4.0)
MCH: 32.8 pg (ref 26.0–34.0)
MCHC: 33.8 g/dL (ref 30.0–36.0)
MCV: 96.9 fL (ref 80.0–100.0)
Monocytes Absolute: 0.1 10*3/uL (ref 0.1–1.0)
Monocytes Relative: 3 %
Neutro Abs: 2.3 10*3/uL (ref 1.7–7.7)
Neutrophils Relative %: 83 %
Platelets: 357 10*3/uL (ref 150–400)
RBC: 2.93 MIL/uL — ABNORMAL LOW (ref 3.87–5.11)
RDW: 15.4 % (ref 11.5–15.5)
WBC: 2.8 10*3/uL — ABNORMAL LOW (ref 4.0–10.5)
nRBC: 0 % (ref 0.0–0.2)
nRBC: 0 /100 WBC

## 2021-09-01 NOTE — Progress Notes (Signed)
Mobility Specialist Progress Note:   09/01/21 1400  Mobility  Activity Ambulated in hall  Level of Assistance Independent  Assistive Device Four point cane  Distance Ambulated (ft) 590 ft  Mobility Ambulated independently in hallway  Mobility Response Tolerated well  Mobility performed by Mobility specialist  Bed Position Chair  $Mobility charge 1 Mobility   Pt asx during ambulation. Will see again today is schedule permits.   Nelta Numbers Mobility Specialist  Phone [phone]

## 2021-09-01 NOTE — Progress Notes (Signed)
Mobility Specialist Progress Note:   09/01/21 1610  Mobility  Activity Ambulated in hall  Level of Assistance Independent  Assistive Device Four point cane  Distance Ambulated (ft) 570 ft  Mobility Ambulated independently in hallway  Mobility Response Tolerated well  Mobility performed by Mobility specialist  Bed Position Chair  $Mobility charge 1 Mobility   Second session today. Pt asx during ambulation.  Nelta Numbers Mobility Specialist  Phone [phone]

## 2021-09-01 NOTE — Progress Notes (Signed)
    [facility] for Infectious Disease    Date of Admission:  08/25/2021   Total days of antibiotics 3/vanco and cefepime          ID: Misty Arnold is a 50 y.o. female with chest wall wound Principal Problem:   Infected wound Active Problems:   Breast cancer of upper-outer quadrant of right female breast ([place])   Metastasis of neoplasm to spinal canal [facility])   Essential hypertension   Breast wound    Subjective: Afebrile, less drainage since restarting abtx; good appetite, sleepy today.  Medications:   (feeding supplement) PROSource Plus  30 mL Oral TID BM   amLODipine  10 mg Oral Daily   vitamin C  1,000 mg Oral Daily   calcium-vitamin D  1 tablet Oral BID WC   cholecalciferol  5,000 Units Oral BID   docusate sodium  100 mg Oral BID   enoxaparin (LOVENOX) injection  40 mg Subcutaneous Q24H   feeding supplement  1 Container Oral TID BM   letrozole  2.5 mg Oral Daily   metroNIDAZOLE   Topical Daily   multivitamin with minerals  1 tablet Oral Daily   oxyCODONE  15 mg Oral Q12H   polyethylene glycol  17 g Oral BID    Objective: Vital signs in last 24 hours: Temp:  [97.8 F (36.6 C)-99 F (37.2 C)] 97.8 F (36.6 C) (10/28 0755) Pulse Rate:  [93-110] 93 (10/28 0755) Resp:  [17-18] 18 (10/28 0755) BP: (117-126)/(79-84) 120/82 (10/28 0755) SpO2:  [98 %-100 %] 100 % (10/28 0755)  Physical Exam  Constitutional:  oriented to person, place, and time. appears well-developed and mal-nourished. No distress.  HENT: [place]/AT, PERRLA, no scleral icterus Mouth/Throat: Oropharynx is clear and moist. No oropharyngeal exudate.  Cardiovascular: Normal rate, regular rhythm and normal heart sounds. Exam reveals no gallop and no friction rub.  No murmur heard.  Pulmonary/Chest: Effort normal and breath sounds normal. No respiratory distress.  has no wheezes.  Neck = supple, no nuchal rigidity Abdominal: Soft. Bowel sounds are normal.  exhibits no distension. There is no tenderness.   Chest wall = still heavy exudate in wound bed. No foul drainage no greenish discharge Neurological: alert and oriented to person, place, and time.  Skin: Skin is warm and dry. No rash noted. No erythema.  Psychiatric: a normal mood and affect.  behavior is normal.    Lab Results Recent Labs    08/31/21 0055 09/01/21 0201  WBC 2.5* 2.8*  HGB 9.6* 9.6*  HCT 28.7* 28.4*  NA 138 135  K 3.4* 3.9  CL 105 105  CO2 26 26  BUN 13 16  CREATININE 0.77 0.66   Liver Panel No results for input(s): PROT, ALBUMIN, AST, ALT, ALKPHOS, BILITOT, BILIDIR, IBILI in the last 72 hours. Sedimentation Rate No results for input(s): ESRSEDRATE in the last 72 hours. C-Reactive Protein No results for input(s): CRP in the last 72 hours.  Microbiology: --------- Studies/Results: No results found.   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 Continue with wound care recommendations. Recommend to continue on vancomycin/cefepime while here and transition to doxycycline 100mg  bid plus levofloxacin 750mg  for a total of 10 days including what she has received here.   Will sign off.  [facility] for Infectious Diseases Pager: [phone]  09/01/2021, 3:30 PM

## 2021-09-01 NOTE — Progress Notes (Signed)
Nutrition Follow-up  DOCUMENTATION CODES:   Not applicable  INTERVENTION:   -Continue MVI with minerals daily -Continue 30 ml Prosource Plus TID, each supplement provides  -Continue Boost Breeze po TID, each supplement provides 250 kcal and 9 grams of protein   NUTRITION DIAGNOSIS:   Increased nutrient needs related to wound healing as evidenced by estimated needs.  Ongoing  GOAL:   Patient will meet greater than or equal to 90% of their needs  Progressing  MONITOR:   PO intake, Supplement acceptance, Labs, Weight trends, Skin, I & O's  REASON FOR ASSESSMENT:   Consult Assessment of nutrition requirement/status  ASSESSMENT:   Misty Arnold is a 49 y.o. female with medical history significant of HTN and metastatic breast cancer to bone presenting with wound infection.  She seemed to be doing ok since her surgery.  Dr. Trenton Gammon said it would be 2 years before she could walk due to spinal mets and surgery.  The breast cancer just keeps coming back.  She had HHN but she has been taking care of the wound herself.  Dr. Lindi Adie referred her to wound care.  Wound care sent her all the supplies she needed the first time.  She required more supplies than were provided through Medicare.  2 weeks ago, she needed new supplies and the box came without everything needed.  When she took off the ABD pads it would stick to the wound which was causing bleeding and irritation.  She tried to get more supplies without success - Medicare wouldn't provide the supplies.  She started noticing a color change in her fluid and decided to come to the ER.  The fluid amount is increasing, needs to change bandages q5h.  It was previously light yellow and is now light green.  Dr. Geralyn Flash nurse recommended that she come to the ER.  She has not had pain meds since 5pm yesterday.  Reviewed I/O's: +1.2 L x 24 hours and +8.5 L since admission  Per general surgery notes, no plan for surgical interventions.   Pt  unavailable at time of visit. Attempted to speak with pt via call to hospital room phone, however, unable to reach.   Pt remains with good appetite. Noted meal completion 50-100%. Pt is accepting both Boost Breeze and Prosource Plus supplements. Of note, pt historically does not tolerate milky supplements well.   Medications reviewed and include vitamin C, colace, and miralax. Constipation has improved with bowel regimen. Per palliative care notes, pt also consumes detox teas and uses coffee enemas at home to promote bowel regularity.   Labs reviewed.   Diet Order:   Diet Order             Diet regular Room service appropriate? Yes; Fluid consistency: Thin  Diet effective now                   EDUCATION NEEDS:   Education needs have been addressed  Skin:  Skin Assessment: Skin Integrity Issues: Skin Integrity Issues:: Other (Comment) Other: rt breast wound  Last BM:  08/30/21  Height:   Ht Readings from Last 1 Encounters:  08/25/21 5\' 5"  (1.651 m)    Weight:   Wt Readings from Last 1 Encounters:  08/25/21 60 kg    Ideal Body Weight:  56.8 kg  BMI:  Body mass index is 22.01 kg/m.  Estimated Nutritional Needs:   Kcal:  1900-2100  Protein:  105-120 grams  Fluid:  > 1.9 L    [name]  W, RD, LDN, [place] Registered Dietitian II Certified Diabetes Care and Education Specialist Please refer to [facility] for RD and/or RD on-call/weekend/after hours pager

## 2021-09-01 NOTE — Progress Notes (Signed)
PROGRESS NOTE    Misty Arnold  EVO:[id number] DOB: [birth year] DOA: 08/25/2021 PCP: Nicholas Lose, MD  Outpatient Specialists:   Brief Narrative:  Misty Arnold is a 50 y.o. female with medical history significant of HTN and metastatic breast cancer to bone presented with worsening right chest wall wound.  She seemed to be doing ok since her surgery.  Dr. Trenton Gammon said it would be 2 years before she could walk due to spinal mets and surgery.  Dr. Lindi Adie referred her to wound care.  Wound care sent her all the supplies she needed the first time.  She required more supplies than were provided through Medicare.  2 weeks ago, she needed new supplies and the box came without everything needed.  When she took off the ABD pads it would stick to the wound which was causing bleeding and irritation.  She tried to get more supplies without success - Medicare wouldn't provide the supplies.  She started noticing a color change in her fluid and decided to come to the ER.  The fluid amount is increasing, needs to change ABD pads q5h.  It was previously light yellow and is now light green.  Dr. Geralyn Flash nurse recommended that she come to the ER.  She has had multiple surgeries.  In 2016, she had a TRAM procedure.  Assessment & Plan:   Principal Problem:   Infected wound Active Problems:   Breast cancer of upper-outer quadrant of right female breast ([place])   Metastasis of neoplasm to spinal canal [facility])   Essential hypertension   Breast wound    wound of the right breast/chest wall -Patient with chest wall chronic wound associated with malignancy -Patient was started on IV vancomycin, IV cefepime and metronidazole gel.  Wound care consulted.  Wound care resumed her outpatient wound dressings.  Due to concern of the need of debridement, general surgery was consulted however they opined that debridement may cause further worsening of wound which may not heal and that she does not need any debridement at this  point in time.  ID was consulted who opined that patient's wound does not seem to have any sort of superinfection or bacterial infection and decided to discontinue antibiotics and watch.  Antibiotics discontinued on the evening of 08/28/2021.  If she were to develop fever, purulent discharge or erythema of the wound then she will be candidate for antibiotics.  Patient claimed that her drainage was increased and became more greenish after stopping of the antibiotics.  Discussed with ID, they took a deep culture from the wound and reinitiated the antibiotics.  Patient is happy with this plan.   Metastatic breast cancer -She refused radiation treatment but did undergo limited palliative rads tx after spinal meds -She is currently on Zoladex, Ibrance, and Letrozole -Palliative care input is appreciated.  She was seen by chaplain as well. -She is a full code at this time Pain is controlled.  She received Zoladex on 08/29/2021 as inpatient.   HTN -Controlled on Norvasc.    Hypokalemia: Resolved.  DVT prophylaxis: Subcutaneous Lovenox Code Status: Full code Family Communication: Her caregiver over the phone. Disposition Plan: Home eventually   Consultants:  General surgery ID  Procedures:  None  Antimicrobials:  IV vancomycin IV cefepime Metronidazole gel   Subjective: Patient seen and examined.  She has no new complaint.  She is waiting for her dressing change to see if drainage is improving.  Objective: Vitals:   08/31/21 2993 08/31/21 1638 08/31/21 2117 09/01/21 7169  BP: 124/84 117/79 126/84 120/82  Pulse: 88 (!) 109 (!) 110 93  Resp: 20 18 17 18   Temp: (!) 97.5 F (36.4 C) 99 F (37.2 C) 98.3 F (36.8 C) 97.8 F (36.6 C)  TempSrc: Oral Oral  Oral  SpO2: 100% 100% 98% 100%  Weight:      Height:        Intake/Output Summary (Last 24 hours) at 09/01/2021 1150 Last data filed at 09/01/2021 0832 Gross per 24 hour  Intake 940 ml  Output --  Net 940 ml    Filed  Weights   08/25/21 2157  Weight: 60 kg    Examination:  General exam: Appears calm and comfortable  Respiratory system: Clear to auscultation. Respiratory effort normal. Cardiovascular system: S1 & S2 heard, RRR. No JVD, murmurs, rubs, gallops or clicks. No pedal edema. Gastrointestinal system: Abdomen is nondistended, soft and nontender. No organomegaly or masses felt. Normal bowel sounds heard. Central nervous system: Alert and oriented. No focal neurological deficits. Extremities: Symmetric 5 x 5 power. Skin: As pictured below Psychiatry: Judgement and insight appear normal. Mood & affect appropriate.      Data Reviewed: I have personally reviewed following labs and imaging studies  CBC: Recent Labs  Lab 08/25/21 2236 08/27/21 0100 08/29/21 0832 08/30/21 0209 08/31/21 0055 09/01/21 0201  WBC 2.4* 2.2* 2.4* 1.5* 2.5* 2.8*  NEUTROABS 1.3*  --  1.8 0.8* 1.9 2.3  HGB 11.1* 10.8* 10.4* 9.0* 9.6* 9.6*  HCT 33.0* 32.3* 30.8* 27.1* 28.7* 28.4*  MCV 97.6 96.4 96.0 96.8 97.0 96.9  PLT 279 275 318 296 350 680    Basic Metabolic Panel: Recent Labs  Lab 08/27/21 0100 08/29/21 0832 08/30/21 0209 08/31/21 0055 09/01/21 0201  NA 137 136 139 138 135  K 3.7 3.5 3.6 3.4* 3.9  CL 103 100 105 105 105  CO2 27 29 29 26 26   GLUCOSE 125* 119* 120* 125* 111*  BUN 9 8 11 13 16   CREATININE 0.97 0.87 0.81 0.77 0.66  CALCIUM 9.1 9.5 8.9 8.7* 9.0  MG  --  2.1  --   --   --     GFR: Estimated Creatinine Clearance: 74 mL/min (by C-G formula based on SCr of 0.66 mg/dL). Liver Function Tests: No results for input(s): AST, ALT, ALKPHOS, BILITOT, PROT, ALBUMIN in the last 168 hours. No results for input(s): LIPASE, AMYLASE in the last 168 hours. No results for input(s): AMMONIA in the last 168 hours. Coagulation Profile: No results for input(s): INR, PROTIME in the last 168 hours. Cardiac Enzymes: No results for input(s): CKTOTAL, CKMB, CKMBINDEX, TROPONINI in the last 168 hours. BNP  (last 3 results) No results for input(s): PROBNP in the last 8760 hours. HbA1C: No results for input(s): HGBA1C in the last 72 hours. CBG: No results for input(s): GLUCAP in the last 168 hours. Lipid Profile: No results for input(s): CHOL, HDL, LDLCALC, TRIG, CHOLHDL, LDLDIRECT in the last 72 hours. Thyroid Function Tests: No results for input(s): TSH, T4TOTAL, FREET4, T3FREE, THYROIDAB in the last 72 hours. Anemia Panel: No results for input(s): VITAMINB12, FOLATE, FERRITIN, TIBC, IRON, RETICCTPCT in the last 72 hours. Urine analysis:    Component Value Date/Time   COLORURINE YELLOW 05/11/2021 2323   APPEARANCEUR CLEAR 05/11/2021 2323   LABSPEC 1.020 05/11/2021 2323   PHURINE 5.0 05/11/2021 2323   GLUCOSEU NEGATIVE 05/11/2021 2323   HGBUR NEGATIVE 05/11/2021 2323   BILIRUBINUR NEGATIVE 05/11/2021 2323   KETONESUR 5 (A) 05/11/2021 2323   PROTEINUR  NEGATIVE 05/11/2021 2323   NITRITE NEGATIVE 05/11/2021 2323   LEUKOCYTESUR NEGATIVE 05/11/2021 2323   Sepsis Labs: @LABRCNTIP (procalcitonin:4,lacticidven:4)  ) Recent Results (from the past 240 hour(s))  Resp Panel by RT-PCR (Flu A&B, Covid) Nasopharyngeal Swab     Status: None   Collection Time: 08/26/21  8:10 AM   Specimen: Nasopharyngeal Swab; Nasopharyngeal(NP) swabs in vial transport medium  Result Value Ref Range Status   SARS Coronavirus 2 by RT PCR NEGATIVE NEGATIVE Final    Comment: (NOTE) SARS-CoV-2 target nucleic acids are NOT DETECTED.  The SARS-CoV-2 RNA is generally detectable in upper respiratory specimens during the acute phase of infection. The lowest concentration of SARS-CoV-2 viral copies this assay can detect is 138 copies/mL. A negative result does not preclude SARS-Cov-2 infection and should not be used as the sole basis for treatment or other patient management decisions. A negative result may occur with  improper specimen collection/handling, submission of specimen other than nasopharyngeal swab,  presence of viral mutation(s) within the areas targeted by this assay, and inadequate number of viral copies(<138 copies/mL). A negative result must be combined with clinical observations, patient history, and epidemiological information. The expected result is Negative.  Fact Sheet for Patients:  EntrepreneurPulse.com.au  Fact Sheet for Healthcare Providers:  IncredibleEmployment.be  This test is no t yet approved or cleared by the [place] FDA and  has been authorized for detection and/or diagnosis of SARS-CoV-2 by FDA under an Emergency Use Authorization (EUA). This EUA will remain  in effect (meaning this test can be used) for the duration of the COVID-19 declaration under Section 564(b)(1) of the Act, 21 U.S.C.section 360bbb-3(b)(1), unless the authorization is terminated  or revoked sooner.       Influenza A by PCR NEGATIVE NEGATIVE Final   Influenza B by PCR NEGATIVE NEGATIVE Final    Comment: (NOTE) The Xpert Xpress SARS-CoV-2/FLU/RSV plus assay is intended as an aid in the diagnosis of influenza from Nasopharyngeal swab specimens and should not be used as a sole basis for treatment. Nasal washings and aspirates are unacceptable for Xpert Xpress SARS-CoV-2/FLU/RSV testing.  Fact Sheet for Patients: EntrepreneurPulse.com.au  Fact Sheet for Healthcare Providers: IncredibleEmployment.be  This test is not yet approved or cleared by the [place] FDA and has been authorized for detection and/or diagnosis of SARS-CoV-2 by FDA under an Emergency Use Authorization (EUA). This EUA will remain in effect (meaning this test can be used) for the duration of the COVID-19 declaration under Section 564(b)(1) of the Act, 21 U.S.C. section 360bbb-3(b)(1), unless the authorization is terminated or revoked.  Performed at [facility], [place] [address], [place], [place] [address]     Radiology  Studies: No results found.   Scheduled Meds:  (feeding supplement) PROSource Plus  30 mL Oral TID BM   amLODipine  10 mg Oral Daily   vitamin C  1,000 mg Oral Daily   calcium-vitamin D  1 tablet Oral BID WC   cholecalciferol  5,000 Units Oral BID   docusate sodium  100 mg Oral BID   enoxaparin (LOVENOX) injection  40 mg Subcutaneous Q24H   feeding supplement  1 Container Oral TID BM   letrozole  2.5 mg Oral Daily   metroNIDAZOLE   Topical Daily   multivitamin with minerals  1 tablet Oral Daily   oxyCODONE  15 mg Oral Q12H   polyethylene glycol  17 g Oral BID   Continuous Infusions:  ceFEPime (MAXIPIME) IV 2 g (09/01/21 0631)   vancomycin  750 mg (09/01/21 0431)      LOS: 4 days   Time spent: 25 minutes Darliss Cheney, MD  [facility] 7PM-7AM contact night coverage as above

## 2021-09-02 DIAGNOSIS — Z515 Encounter for palliative care: Secondary | ICD-10-CM

## 2021-09-02 NOTE — Progress Notes (Signed)
Palliative Medicine Inpatient Follow Up Note  Consulting Provider: Karmen Bongo, MD   Reason for consult:   Question Answer  Reason for Consult? breast cancer wound    HPI:  Per intake H&P --> Misty Arnold is a 50 y.o. female with medical history significant of HTN and metastatic breast cancer to bone presenting with wound infection.  She seemed to be doing ok since her surgery.  Dr. Trenton Gammon said it would be 2 years before she could walk due to spinal mets and surgery.  The breast cancer just keeps coming back.  She had HHN but she has been taking care of the wound herself.  Dr. Lindi Adie referred her to wound care.    Palliative care has been consulted for management of symptoms in the setting of metastatic breast cancer and a large exudative wound on her right chest from a necrotic mass.  Today's Discussion (09/02/2021):  *Please note that this is a verbal dictation therefore any spelling or grammatical errors are due to the "[place] [facility]" system interpretation.  Chart reviewed.   I met with Misty Arnold at bedside.  She shares with me that she feels like she was making steps forward until Wednesday when she had a very for encounter with a staff member who from her perspective provided very suboptimal care.  This unfortunately led to a series of events which Misty Arnold believes have prolonged her hospital course.  I offered time for her to express the difficulties endured during these encounters.  I provided support through therapeutic listening.  Misty Arnold endorses that her pain has not been as well managed as she skipped a day of pain medication due to the above described situation.  She expresses that today she feels like she is getting back on track and making up for lost time.  She is very much aware of the importance of taking her pain medications as needed and as prescribed so that she does not feel as symptomatic.  Otherwise Misty Arnold remains optimistic for improvements and  potential discharge at the beginning of next week.  Palliative support provided.  Objective Assessment: Vital Signs Vitals:   09/01/21 2023 09/02/21 0936  BP: 108/71 102/72  Pulse: 97 97  Resp: 20 18  Temp: 97.6 F (36.4 C) 98.4 F (36.9 C)  SpO2: 100% 100%    Intake/Output Summary (Last 24 hours) at 09/02/2021 1529 Last data filed at 09/02/2021 0228 Gross per 24 hour  Intake 1000 ml  Output --  Net 1000 ml    Last Weight  Most recent update: 08/25/2021  9:57 PM    Weight  60 kg (132 lb 4.4 oz)            Gen:  Middle aged Misty Arnold F in NAD HEENT: moist mucous membranes CV: Irregular rate and rhythm  PULM: On RA, breathing is even and non-labored ABD: (+) large wound R chest extending to abdomen, covered with dressing EXT: No edema  Neuro: Alert and oriented x3   SUMMARY OF RECOMMENDATIONS   Full Code/ Full Scope of Care   Continue current measures to support care   Referral to OP Palliative Oncology clinic - Have spoken to [facility] via telephone to make aware   Referral for anxiety cognitive behavioral therapy on discharge - will refer to psychologist Elias Else [phone] --> Referral to be made through Oncology. Elias Else and [employer] had communicated regarding this.    Symptom needs as below   Ongoing PMT support    Code  Status/Advance Care Planning: FULL CODE   Symptom Management:  Generalized Pain from spine mets: - Oxycontin 35m PO Q12H - Continue oxycodone 16mPO Q3H PRN - Continue morphine 42m8mV Q2H PRN for breakthrough pain   Anxiety & Stress: - Would benefit from cognitive behavioral therapy as does not wish to take medications for this. Will refer for cognitive behavioral therapy. - Patient utilizes aromatherapy oils and detoxifying foot therapy on her own   Bowel Regularity: - Continue Miralax 17g PO BID - Patient will continue coffee enemas on DC   Wound Care: - Appreciate wound care consult  - metronidazole 0.75 %  gel daily x 14 days total   Muscular Weakness: - PT/OT   Nutrition: - Dietary consult - Receiving decadron 1mg10m PRN   Muscle Spasms: - Continue valium 5mg 42mQ12H PRN   Social: - Patient is interested in moving closer to her doctors but lives on a fixed income. Appreciate MSW to help navigate the possibilities.    Emotional Support: - Patient is a member of a support group though it is limited in her small town - Will provide additional resources for support groups of woman contending with breast cancer   Advance Care Planning: - Advance Directives complete  Time Spent: 53 minutes Greater than 50% of the time was spent in counseling and coordination of care ______________________________________________________________________________________ [place] Team Cell Phone: [phone] utilize secure chat with additional questions, if there is no response within 30 minutes please call the above phone number  Palliative Medicine Team providers are available by phone from 7am to 7pm daily and can be reached through the team cell phone.  Should this patient require assistance outside of these hours, please call the patient's attending physician.

## 2021-09-02 NOTE — Plan of Care (Signed)
  Problem: Nutrition: Goal: Adequate nutrition will be maintained Outcome: Progressing   Problem: Pain Managment: Goal: General experience of comfort will improve Outcome: Progressing   

## 2021-09-02 NOTE — Progress Notes (Signed)
PROGRESS NOTE    Misty Arnold  RDE:[id number] DOB: 15-Nov-1970 DOA: 08/25/2021 PCP: Nicholas Lose, MD  Outpatient Specialists:   Brief Narrative:  Misty Arnold is a 50 y.o. female with medical history significant of HTN and metastatic breast cancer to bone presented with worsening right chest wall wound.  She seemed to be doing ok since her surgery.  Dr. Trenton Gammon said it would be 2 years before she could walk due to spinal mets and surgery.  Dr. Lindi Adie referred her to wound care.  Wound care sent her all the supplies she needed the first time.  She required more supplies than were provided through Medicare.  2 weeks ago, she needed new supplies and the box came without everything needed.  When she took off the ABD pads it would stick to the wound which was causing bleeding and irritation.  She tried to get more supplies without success - Medicare wouldn't provide the supplies.  She started noticing a color change in her fluid and decided to come to the ER.  The fluid amount is increasing, needs to change ABD pads q5h.  It was previously light yellow and is now light green.  Dr. Geralyn Flash nurse recommended that she come to the ER.  She has had multiple surgeries.  In 2016, she had a TRAM procedure.  Assessment & Plan:   Principal Problem:   Infected wound Active Problems:   Breast cancer of upper-outer quadrant of right female breast ([place])   Metastasis of neoplasm to spinal canal [facility])   Essential hypertension   Breast wound    wound of the right breast/chest wall -Patient with chest wall chronic wound associated with malignancy -Patient was started on IV vancomycin, IV cefepime and metronidazole gel.  Wound care consulted.  Wound care resumed her outpatient wound dressings.  Due to concern of the need of debridement, general surgery was consulted however they opined that debridement may cause further worsening of wound which may not heal and that she does not need any debridement at this  point in time.  ID was consulted who opined that patient's wound does not seem to have any sort of superinfection or bacterial infection and decided to discontinue antibiotics and watch.  Antibiotics discontinued on the evening of 08/28/2021.  If she were to develop fever, purulent discharge or erythema of the wound then she will be candidate for antibiotics.  Patient claimed that her drainage was increased and became more greenish after stopping of the antibiotics.  Discussed with ID, they took a deep culture from the wound and reinitiated the antibiotics.  She tells me that her dressing needed to be changed twice yesterday due to high amount of drainage.  She wants to continue IV antibiotics today and see how it goes and then transition to oral antibiotics tomorrow and watching another night before she can go home on Monday.   Metastatic breast cancer -She refused radiation treatment but did undergo limited palliative rads tx after spinal meds -She is currently on Zoladex, Ibrance, and Letrozole -Palliative care input is appreciated.  She was seen by chaplain as well. -She is a full code at this time Pain is controlled.  She received Zoladex on 08/29/2021 as inpatient.   HTN -Controlled on Norvasc.    Hypokalemia: Resolved.  DVT prophylaxis: Subcutaneous Lovenox Code Status: Full code Family Communication:  Disposition Plan: Home eventually   Consultants:  General surgery ID  Procedures:  None  Antimicrobials:  IV vancomycin IV cefepime Metronidazole gel  Subjective: Patient seen and examined.  She complains of back pain.  She tells me that her drainage was still high yesterday.  She tells me that she called Dr. Irven [place] office and requested that he comes and evaluates her for her back pain but she was very upset and disappointed that they actually told her to discuss with physicians worsening in the hospital.  She is upset because he has been her doctor and has established  relationship with Dr. Trenton Gammon.  Objective: Vitals:   09/01/21 0755 09/01/21 1533 09/01/21 2023 09/02/21 0936  BP: 120/82 116/72 108/71 102/72  Pulse: 93 (!) 106 97 97  Resp: 18 20 20 18   Temp: 97.8 F (36.6 C) (!) 97.5 F (36.4 C) 97.6 F (36.4 C) 98.4 F (36.9 C)  TempSrc: Oral Oral Oral Oral  SpO2: 100% 100% 100% 100%  Weight:      Height:        Intake/Output Summary (Last 24 hours) at 09/02/2021 1350 Last data filed at 09/02/2021 0228 Gross per 24 hour  Intake 1300 ml  Output --  Net 1300 ml    Filed Weights   08/25/21 2157  Weight: 60 kg    Examination:  General exam: Appears calm and comfortable  Respiratory system: Clear to auscultation. Respiratory effort normal. Cardiovascular system: S1 & S2 heard, RRR. No JVD, murmurs, rubs, gallops or clicks. No pedal edema. Gastrointestinal system: Abdomen is nondistended, soft and nontender. No organomegaly or masses felt. Normal bowel sounds heard. Central nervous system: Alert and oriented. No focal neurological deficits. Extremities: Symmetric 5 x 5 power. Skin: As pictured below. Psychiatry: Judgement and insight appear normal. Mood & affect appropriate.       Data Reviewed: I have personally reviewed following labs and imaging studies  CBC: Recent Labs  Lab 08/27/21 0100 08/29/21 0832 08/30/21 0209 08/31/21 0055 09/01/21 0201  WBC 2.2* 2.4* 1.5* 2.5* 2.8*  NEUTROABS  --  1.8 0.8* 1.9 2.3  HGB 10.8* 10.4* 9.0* 9.6* 9.6*  HCT 32.3* 30.8* 27.1* 28.7* 28.4*  MCV 96.4 96.0 96.8 97.0 96.9  PLT 275 318 296 350 397    Basic Metabolic Panel: Recent Labs  Lab 08/27/21 0100 08/29/21 0832 08/30/21 0209 08/31/21 0055 09/01/21 0201  NA 137 136 139 138 135  K 3.7 3.5 3.6 3.4* 3.9  CL 103 100 105 105 105  CO2 27 29 29 26 26   GLUCOSE 125* 119* 120* 125* 111*  BUN 9 8 11 13 16   CREATININE 0.97 0.87 0.81 0.77 0.66  CALCIUM 9.1 9.5 8.9 8.7* 9.0  MG  --  2.1  --   --   --     GFR: Estimated Creatinine  Clearance: 74 mL/min (by C-G formula based on SCr of 0.66 mg/dL). Liver Function Tests: No results for input(s): AST, ALT, ALKPHOS, BILITOT, PROT, ALBUMIN in the last 168 hours. No results for input(s): LIPASE, AMYLASE in the last 168 hours. No results for input(s): AMMONIA in the last 168 hours. Coagulation Profile: No results for input(s): INR, PROTIME in the last 168 hours. Cardiac Enzymes: No results for input(s): CKTOTAL, CKMB, CKMBINDEX, TROPONINI in the last 168 hours. BNP (last 3 results) No results for input(s): PROBNP in the last 8760 hours. HbA1C: No results for input(s): HGBA1C in the last 72 hours. CBG: No results for input(s): GLUCAP in the last 168 hours. Lipid Profile: No results for input(s): CHOL, HDL, LDLCALC, TRIG, CHOLHDL, LDLDIRECT in the last 72 hours. Thyroid Function Tests: No results for input(s):  TSH, T4TOTAL, FREET4, T3FREE, THYROIDAB in the last 72 hours. Anemia Panel: No results for input(s): VITAMINB12, FOLATE, FERRITIN, TIBC, IRON, RETICCTPCT in the last 72 hours. Urine analysis:    Component Value Date/Time   COLORURINE YELLOW 05/11/2021 2323   APPEARANCEUR CLEAR 05/11/2021 2323   LABSPEC 1.020 05/11/2021 2323   PHURINE 5.0 05/11/2021 2323   GLUCOSEU NEGATIVE 05/11/2021 2323   HGBUR NEGATIVE 05/11/2021 2323   BILIRUBINUR NEGATIVE 05/11/2021 2323   KETONESUR 5 (A) 05/11/2021 2323   PROTEINUR NEGATIVE 05/11/2021 2323   NITRITE NEGATIVE 05/11/2021 2323   LEUKOCYTESUR NEGATIVE 05/11/2021 2323   Sepsis Labs: @LABRCNTIP (procalcitonin:4,lacticidven:4)  ) Recent Results (from the past 240 hour(s))  Resp Panel by RT-PCR (Flu A&B, Covid) Nasopharyngeal Swab     Status: None   Collection Time: 08/26/21  8:10 AM   Specimen: Nasopharyngeal Swab; Nasopharyngeal(NP) swabs in vial transport medium  Result Value Ref Range Status   SARS Coronavirus 2 by RT PCR NEGATIVE NEGATIVE Final    Comment: (NOTE) SARS-CoV-2 target nucleic acids are NOT  DETECTED.  The SARS-CoV-2 RNA is generally detectable in upper respiratory specimens during the acute phase of infection. The lowest concentration of SARS-CoV-2 viral copies this assay can detect is 138 copies/mL. A negative result does not preclude SARS-Cov-2 infection and should not be used as the sole basis for treatment or other patient management decisions. A negative result may occur with  improper specimen collection/handling, submission of specimen other than nasopharyngeal swab, presence of viral mutation(s) within the areas targeted by this assay, and inadequate number of viral copies(<138 copies/mL). A negative result must be combined with clinical observations, patient history, and epidemiological information. The expected result is Negative.  Fact Sheet for Patients:  EntrepreneurPulse.com.au  Fact Sheet for Healthcare Providers:  IncredibleEmployment.be  This test is no t yet approved or cleared by the [place] FDA and  has been authorized for detection and/or diagnosis of SARS-CoV-2 by FDA under an Emergency Use Authorization (EUA). This EUA will remain  in effect (meaning this test can be used) for the duration of the COVID-19 declaration under Section 564(b)(1) of the Act, 21 U.S.C.section 360bbb-3(b)(1), unless the authorization is terminated  or revoked sooner.       Influenza A by PCR NEGATIVE NEGATIVE Final   Influenza B by PCR NEGATIVE NEGATIVE Final    Comment: (NOTE) The Xpert Xpress SARS-CoV-2/FLU/RSV plus assay is intended as an aid in the diagnosis of influenza from Nasopharyngeal swab specimens and should not be used as a sole basis for treatment. Nasal washings and aspirates are unacceptable for Xpert Xpress SARS-CoV-2/FLU/RSV testing.  Fact Sheet for Patients: EntrepreneurPulse.com.au  Fact Sheet for Healthcare Providers: IncredibleEmployment.be  This test is not yet  approved or cleared by the [place] FDA and has been authorized for detection and/or diagnosis of SARS-CoV-2 by FDA under an Emergency Use Authorization (EUA). This EUA will remain in effect (meaning this test can be used) for the duration of the COVID-19 declaration under Section 564(b)(1) of the Act, 21 U.S.C. section 360bbb-3(b)(1), unless the authorization is terminated or revoked.  Performed at [facility], [place] [address], [place], [place] [address]     Radiology Studies: No results found.   Scheduled Meds:  (feeding supplement) PROSource Plus  30 mL Oral TID BM   amLODipine  10 mg Oral Daily   vitamin C  1,000 mg Oral Daily   calcium-vitamin D  1 tablet Oral BID WC   cholecalciferol  5,000 Units Oral  BID   docusate sodium  100 mg Oral BID   enoxaparin (LOVENOX) injection  40 mg Subcutaneous Q24H   feeding supplement  1 Container Oral TID BM   letrozole  2.5 mg Oral Daily   metroNIDAZOLE   Topical Daily   multivitamin with minerals  1 tablet Oral Daily   oxyCODONE  15 mg Oral Q12H   polyethylene glycol  17 g Oral BID   Continuous Infusions:  ceFEPime (MAXIPIME) IV 2 g (09/02/21 0506)   vancomycin 750 mg (09/02/21 0601)      LOS: 5 days   Time spent: 27 minutes Darliss Cheney, MD  [facility] 7PM-7AM contact night coverage as above

## 2021-09-03 DIAGNOSIS — Z7189 Other specified counseling: Secondary | ICD-10-CM

## 2021-09-03 MED ORDER — LEVOFLOXACIN 500 MG PO TABS
750.0000 mg | ORAL_TABLET | Freq: Every day | ORAL | Status: DC
  Administered 2021-09-03 – 2021-09-05 (×3): 750 mg via ORAL
  Filled 2021-09-03 (×3): qty 2

## 2021-09-03 MED ORDER — MELATONIN 3 MG PO TABS
3.0000 mg | ORAL_TABLET | Freq: Every day | ORAL | Status: DC
  Administered 2021-09-03 – 2021-09-04 (×2): 3 mg via ORAL
  Filled 2021-09-03 (×2): qty 1

## 2021-09-03 MED ORDER — DOXYCYCLINE HYCLATE 100 MG PO TABS
100.0000 mg | ORAL_TABLET | Freq: Two times a day (BID) | ORAL | Status: DC
  Administered 2021-09-03 – 2021-09-05 (×5): 100 mg via ORAL
  Filled 2021-09-03 (×5): qty 1

## 2021-09-03 MED ORDER — OXYCODONE HCL ER 10 MG PO T12A
20.0000 mg | EXTENDED_RELEASE_TABLET | Freq: Two times a day (BID) | ORAL | Status: DC
  Administered 2021-09-03 – 2021-09-05 (×5): 20 mg via ORAL
  Filled 2021-09-03 (×5): qty 2

## 2021-09-03 NOTE — Progress Notes (Signed)
PROGRESS NOTE    Misty Arnold  OTL:[id number] DOB: 11-30-1970 DOA: 08/25/2021 PCP: Nicholas Lose, MD  Outpatient Specialists:   Brief Narrative:  Misty Arnold is a 50 y.o. female with medical history significant of HTN and metastatic breast cancer to bone presented with worsening right chest wall wound.  She seemed to be doing ok since her surgery.  Dr. Trenton Gammon said it would be 2 years before she could walk due to spinal mets and surgery.  Dr. Lindi Adie referred her to wound care.  Wound care sent her all the supplies she needed the first time.  She required more supplies than were provided through Medicare.  2 weeks ago, she needed new supplies and the box came without everything needed.  When she took off the ABD pads it would stick to the wound which was causing bleeding and irritation.  She tried to get more supplies without success - Medicare wouldn't provide the supplies.  She started noticing a color change in her fluid and decided to come to the ER.  The fluid amount is increasing, needs to change ABD pads q5h.  It was previously light yellow and is now light green.  Dr. Geralyn Flash nurse recommended that she come to the ER.  She has had multiple surgeries.  In 2016, she had a TRAM procedure.  Assessment & Plan:   Principal Problem:   Infected wound Active Problems:   Breast cancer of upper-outer quadrant of right female breast ([place])   Metastasis of neoplasm to spinal canal [facility])   Essential hypertension   Breast wound    wound of the right breast/chest wall -Patient with chest wall chronic wound associated with malignancy -Patient was started on IV vancomycin, IV cefepime and metronidazole gel.  Wound care consulted.  Wound care resumed her outpatient wound dressings.  Due to concern of the need of debridement, general surgery was consulted however they opined that debridement may cause further worsening of wound which may not heal and that she does not need any debridement at this  point in time.  ID was consulted who opined that patient's wound does not seem to have any sort of superinfection or bacterial infection and decided to discontinue antibiotics and watch.  Antibiotics discontinued on the evening of 08/28/2021.  If she were to develop fever, purulent discharge or erythema of the wound then she will be candidate for antibiotics.  Patient claimed that her drainage was increased and became more greenish after stopping of the antibiotics.  Discussed with ID, they took a deep culture from the wound and reinitiated the antibiotics.  She tells me her drainage has improved since starting of IV antibiotics.  She is agreeable with the plan we set up yesterday which is starting her on oral antibiotics, ID recommended doxycycline plus Levaquin and watching her overnight and possibly discharging her home tomorrow.   Metastatic breast cancer -She refused radiation treatment but did undergo limited palliative rads tx after spinal meds -She is currently on Zoladex, Ibrance, and Letrozole -Palliative care input is appreciated.  She was seen by chaplain as well. -She is a full code at this time Pain is controlled.  She received Zoladex on 08/29/2021 as inpatient.   HTN -Controlled on Norvasc.    Hypokalemia: Resolved.  DVT prophylaxis: Subcutaneous Lovenox Code Status: Full code Family Communication:  Disposition Plan: Home hopefully tomorrow.   Consultants:  General surgery ID  Procedures:  None  Antimicrobials:  IV vancomycin IV cefepime Metronidazole gel   Subjective: Patient seen  and examined.  She has no new complaint today.  Objective: Vitals:   09/02/21 1729 09/02/21 2129 09/03/21 0328 09/03/21 0808  BP: 122/79 118/78 123/80 108/78  Pulse: (!) 108 (!) 107 (!) 102 89  Resp: 18 19 18 18   Temp: 98 F (36.7 C) 98.8 F (37.1 C) 98.3 F (36.8 C) 98.3 F (36.8 C)  TempSrc: Oral Oral Oral Oral  SpO2: 100% 98% 100% 98%  Weight:      Height:         Intake/Output Summary (Last 24 hours) at 09/03/2021 1129 Last data filed at 09/03/2021 0329 Gross per 24 hour  Intake 540 ml  Output --  Net 540 ml    Filed Weights   08/25/21 2157  Weight: 60 kg    Examination:  General exam: Appears calm and comfortable  Respiratory system: Clear to auscultation. Respiratory effort normal. Cardiovascular system: S1 & S2 heard, RRR. No JVD, murmurs, rubs, gallops or clicks. No pedal edema. Gastrointestinal system: Abdomen is nondistended, soft and nontender. No organomegaly or masses felt. Normal bowel sounds heard. Central nervous system: Alert and oriented. No focal neurological deficits. Extremities: Symmetric 5 x 5 power. Psychiatry: Judgement and insight appear normal. Mood & affect appropriate.       Data Reviewed: I have personally reviewed following labs and imaging studies  CBC: Recent Labs  Lab 08/29/21 0832 08/30/21 0209 08/31/21 0055 09/01/21 0201  WBC 2.4* 1.5* 2.5* 2.8*  NEUTROABS 1.8 0.8* 1.9 2.3  HGB 10.4* 9.0* 9.6* 9.6*  HCT 30.8* 27.1* 28.7* 28.4*  MCV 96.0 96.8 97.0 96.9  PLT 318 296 350 657    Basic Metabolic Panel: Recent Labs  Lab 08/29/21 0832 08/30/21 0209 08/31/21 0055 09/01/21 0201  NA 136 139 138 135  K 3.5 3.6 3.4* 3.9  CL 100 105 105 105  CO2 29 29 26 26   GLUCOSE 119* 120* 125* 111*  BUN 8 11 13 16   CREATININE 0.87 0.81 0.77 0.66  CALCIUM 9.5 8.9 8.7* 9.0  MG 2.1  --   --   --     GFR: Estimated Creatinine Clearance: 74 mL/min (by C-G formula based on SCr of 0.66 mg/dL). Liver Function Tests: No results for input(s): AST, ALT, ALKPHOS, BILITOT, PROT, ALBUMIN in the last 168 hours. No results for input(s): LIPASE, AMYLASE in the last 168 hours. No results for input(s): AMMONIA in the last 168 hours. Coagulation Profile: No results for input(s): INR, PROTIME in the last 168 hours. Cardiac Enzymes: No results for input(s): CKTOTAL, CKMB, CKMBINDEX, TROPONINI in the last 168  hours. BNP (last 3 results) No results for input(s): PROBNP in the last 8760 hours. HbA1C: No results for input(s): HGBA1C in the last 72 hours. CBG: No results for input(s): GLUCAP in the last 168 hours. Lipid Profile: No results for input(s): CHOL, HDL, LDLCALC, TRIG, CHOLHDL, LDLDIRECT in the last 72 hours. Thyroid Function Tests: No results for input(s): TSH, T4TOTAL, FREET4, T3FREE, THYROIDAB in the last 72 hours. Anemia Panel: No results for input(s): VITAMINB12, FOLATE, FERRITIN, TIBC, IRON, RETICCTPCT in the last 72 hours. Urine analysis:    Component Value Date/Time   COLORURINE YELLOW 05/11/2021 2323   APPEARANCEUR CLEAR 05/11/2021 2323   LABSPEC 1.020 05/11/2021 2323   PHURINE 5.0 05/11/2021 2323   GLUCOSEU NEGATIVE 05/11/2021 2323   HGBUR NEGATIVE 05/11/2021 2323   BILIRUBINUR NEGATIVE 05/11/2021 2323   KETONESUR 5 (A) 05/11/2021 2323   PROTEINUR NEGATIVE 05/11/2021 2323   NITRITE NEGATIVE 05/11/2021 2323  LEUKOCYTESUR NEGATIVE 05/11/2021 2323   Sepsis Labs: @LABRCNTIP (procalcitonin:4,lacticidven:4)  ) Recent Results (from the past 240 hour(s))  Resp Panel by RT-PCR (Flu A&B, Covid) Nasopharyngeal Swab     Status: None   Collection Time: 08/26/21  8:10 AM   Specimen: Nasopharyngeal Swab; Nasopharyngeal(NP) swabs in vial transport medium  Result Value Ref Range Status   SARS Coronavirus 2 by RT PCR NEGATIVE NEGATIVE Final    Comment: (NOTE) SARS-CoV-2 target nucleic acids are NOT DETECTED.  The SARS-CoV-2 RNA is generally detectable in upper respiratory specimens during the acute phase of infection. The lowest concentration of SARS-CoV-2 viral copies this assay can detect is 138 copies/mL. A negative result does not preclude SARS-Cov-2 infection and should not be used as the sole basis for treatment or other patient management decisions. A negative result may occur with  improper specimen collection/handling, submission of specimen other than nasopharyngeal  swab, presence of viral mutation(s) within the areas targeted by this assay, and inadequate number of viral copies(<138 copies/mL). A negative result must be combined with clinical observations, patient history, and epidemiological information. The expected result is Negative.  Fact Sheet for Patients:  EntrepreneurPulse.com.au  Fact Sheet for Healthcare Providers:  IncredibleEmployment.be  This test is no t yet approved or cleared by the [place] FDA and  has been authorized for detection and/or diagnosis of SARS-CoV-2 by FDA under an Emergency Use Authorization (EUA). This EUA will remain  in effect (meaning this test can be used) for the duration of the COVID-19 declaration under Section 564(b)(1) of the Act, 21 U.S.C.section 360bbb-3(b)(1), unless the authorization is terminated  or revoked sooner.       Influenza A by PCR NEGATIVE NEGATIVE Final   Influenza B by PCR NEGATIVE NEGATIVE Final    Comment: (NOTE) The Xpert Xpress SARS-CoV-2/FLU/RSV plus assay is intended as an aid in the diagnosis of influenza from Nasopharyngeal swab specimens and should not be used as a sole basis for treatment. Nasal washings and aspirates are unacceptable for Xpert Xpress SARS-CoV-2/FLU/RSV testing.  Fact Sheet for Patients: EntrepreneurPulse.com.au  Fact Sheet for Healthcare Providers: IncredibleEmployment.be  This test is not yet approved or cleared by the [place] FDA and has been authorized for detection and/or diagnosis of SARS-CoV-2 by FDA under an Emergency Use Authorization (EUA). This EUA will remain in effect (meaning this test can be used) for the duration of the COVID-19 declaration under Section 564(b)(1) of the Act, 21 U.S.C. section 360bbb-3(b)(1), unless the authorization is terminated or revoked.  Performed at [facility], [place] [address] [place], [place] [address]     Radiology  Studies: No results found.   Scheduled Meds:  (feeding supplement) PROSource Plus  30 mL Oral TID BM   amLODipine  10 mg Oral Daily   vitamin C  1,000 mg Oral Daily   calcium-vitamin D  1 tablet Oral BID WC   cholecalciferol  5,000 Units Oral BID   docusate sodium  100 mg Oral BID   doxycycline  100 mg Oral Q12H   enoxaparin (LOVENOX) injection  40 mg Subcutaneous Q24H   feeding supplement  1 Container Oral TID BM   letrozole  2.5 mg Oral Daily   levofloxacin  750 mg Oral Daily   melatonin  3 mg Oral QHS   multivitamin with minerals  1 tablet Oral Daily   oxyCODONE  20 mg Oral Q12H   polyethylene glycol  17 g Oral BID   Continuous Infusions:  LOS: 6 days   Time spent: 25 minutes Darliss Cheney, MD  [facility] 7PM-7AM contact night coverage as above

## 2021-09-03 NOTE — Progress Notes (Signed)
Pharmacy Antibiotic Note  Misty Arnold is a 50 y.o. female admitted on 08/25/2021 with cellulitis. Pharmacy has been consulted 10/27 to restart  vancomycin and cefepime dosing for wound infection.  40 yof with a history of  HTN and metastatic breast cancer to bone presenting with wound infection. Scr stable and patient clinically improving. ID has signed off but planning to transition to doxy/levofloxacin for a total of 10 days of antibiotics including antibiotics while inpatient.    Plan: Continue Cefepime to 2g q8h  Continue Vancomycin 750 mg IV q12h (eAUC 465, Scr Used 0.7) Trend WBC, fever, renal function Plan for transition to orals at discharge in the coming days  Height: 5\' 5"  (165.1 cm) Weight: 60 kg (132 lb 4.4 oz) IBW/kg (Calculated) : 57  Temp (24hrs), Avg:98.4 F (36.9 C), Min:98 F (36.7 C), Max:98.8 F (37.1 C)  Recent Labs  Lab 08/29/21 0832 08/30/21 0209 08/31/21 0055 09/01/21 0201  WBC 2.4* 1.5* 2.5* 2.8*  CREATININE 0.87 0.81 0.77 0.66     Estimated Creatinine Clearance: 74 mL/min (by C-G formula based on SCr of 0.66 mg/dL).    Allergies  Allergen Reactions   Amoxicillin     Other reaction(s): rash/itching   Penicillins Rash    Did it involve swelling of the face/tongue/throat, SOB, or low BP? no Did it involve sudden or severe rash/hives, skin peeling, or any reaction on the inside of your mouth or nose? Yes Did you need to seek medical attention at a hospital or doctor's office? No When did it last happen? early 20's     If all above answers are "NO", may proceed with cephalosporin use.   Pork-Derived Products    Penicillin G    Tamoxifen Hypertension    Thank you for allowing pharmacy to be a part of this patient's care.  Cephus Slater, PharmD, MBA Pharmacy Resident [phone] 09/03/2021 9:26 AM

## 2021-09-03 NOTE — Plan of Care (Signed)
  Problem: Nutrition: Goal: Adequate nutrition will be maintained Outcome: Progressing   Problem: Pain Managment: Goal: General experience of comfort will improve Outcome: Progressing   

## 2021-09-03 NOTE — Consult Note (Addendum)
[facility] Nurse Consult Note: Reason for Consult:Patient education Bedside RN contacted [facility] Nurse regarding consult for patient education. Patient education regarding dressing changes can be performed by the Bedside RN. Reportable signs and symptoms would be reviewed by the discharging provider. Patient has been seen by the outpatient wound care center by Dr. Heber Witmer. Recommend continuation of those visits.   [facility] nursing team will not follow, but will remain available to this patient, the nursing and medical teams.  Please re-consult if needed. Thanks, Maudie Flakes, MSN, RN, [place], Arther Abbott  Pager# [phone]

## 2021-09-03 NOTE — Progress Notes (Signed)
 Palliative Medicine Inpatient Follow Up Note  Consulting Provider: Yates, Jennifer, MD   Reason for consult:   Question Answer  Reason for Consult? breast cancer wound    HPI:  Per intake H&P --> Misty Arnold is a 50 y.o. female with medical history significant of HTN and metastatic breast cancer to bone presenting with wound infection.  She seemed to be doing ok since her surgery.  Dr. Poole said it would be 2 years before she could walk due to spinal mets and surgery.  The breast cancer just keeps coming back.  She had HHN but she has been taking care of the wound herself.  Dr. Gudena referred her to wound care.    Palliative care has been consulted for management of symptoms in the setting of metastatic breast cancer and a large exudative wound on her right chest from a necrotic mass.  Today's Discussion (09/03/2021):  *Please note that this is a verbal dictation therefore any spelling or grammatical errors are due to the "Dragon Medical One" system interpretation.  Chart reviewed.   I spoke to Misty Arnold's nightime RN we reviewed that Misty Arnold had woken up around 2 AM and had a difficult time falling back asleep.  She shares that she provided some additional morphine which appeared to have helped.  Misty Arnold appeared to be sleeping at the time I rounded. _______________________________________________________  I met with Misty Arnold in the afternoon. She expresses that she did have a difficult time getting to sleep.  She expresses that her pain seems to be getting under control as she is playing catch up from the day she did not receive any medications.  Misty Arnold shares with me that she will possibly discharge tomorrow.  She reviewed that a few things need to occur first,  she needs to start on oral antibiotics to verify they will be effective. Secondly, she would like to meet with the wound care team to identify what is and is not normal in terms of discharge from the wound.  Misty Arnold has an increase in drainage as compared to the days prior and questions if this is normal.  We reviewed the plan to ask the wound care team to come by and provide education and instructions on what to do with the wound and when to be concerned.  Provided social and emotional support at bedside.  Patient expresses excitement at the idea of going home but also apprehension as she does not want to have to return to the hospital prematurely for additional care.  Palliative support provided.  Objective Assessment: Vital Signs Vitals:   09/02/21 2129 09/03/21 0328  BP: 118/78 123/80  Pulse: (!) 107 (!) 102  Resp: 19 18  Temp: 98.8 F (37.1 C) 98.3 F (36.8 C)  SpO2: 98% 100%    Intake/Output Summary (Last 24 hours) at 09/03/2021 0759 Last data filed at 09/03/2021 0329 Gross per 24 hour  Intake 540 ml  Output --  Net 540 ml    Last Weight  Most recent update: 08/25/2021  9:57 PM    Weight  60 kg (132 lb 4.4 oz)            Gen:  Middle aged AA F in NAD HEENT: moist mucous membranes CV: Irregular rate and rhythm  PULM: On RA, breathing is even and non-labored ABD: (+) large wound R chest extending to abdomen, covered with dressing EXT: No edema  Neuro: Alert and oriented x3   SUMMARY OF RECOMMENDATIONS   Full Code/ Full Scope   of Care   Continue current measures to support care   Referral to OP Palliative Oncology clinic - Have spoken to Athena Cousar via telephone to make aware   Referral for anxiety cognitive behavioral therapy on discharge - will refer to psychologist Matt Schooler [phone] --> Referral to be made through Oncology. Matt Schooler and Athena Cousar had communicated regarding this.    Symptom needs as below   Ongoing PMT support    Code Status/Advance Care Planning: FULL CODE   Symptom Management:  Generalized Pain from spine mets: - Increase Oxycontin 20mg PO Q12H - Continue oxycodone 10mg PO Q3H PRN - Continue morphine 2mg IV Q2H  PRN for breakthrough pain   Anxiety & Stress: - Would benefit from cognitive behavioral therapy as does not wish to take medications for this. Will refer for cognitive behavioral therapy. - Patient utilizes aromatherapy oils and detoxifying foot therapy on her own   Bowel Regularity: - Continue Miralax 17g PO BID - Patient will continue coffee enemas on DC   Wound Care: - Appreciate wound care providing additional education on wound care - metronidazole 0.75 % gel daily x 7 additional days    Muscular Weakness: - PT/OT   Nutrition: - Dietary consult - Receiving decadron 1mg PO PRN   Muscle Spasms: - Continue valium 5mg PO Q12H PRN  Insomnia: - Melatonin 3mg PO    Social: - Patient is interested in moving closer to her doctors but lives on a fixed income. Appreciate MSW to help navigate the possibilities.    Emotional Support: - Patient is a member of a support group though it is limited in her small town - Will provide additional resources for support groups of woman contending with breast cancer   Advance Care Planning: - Advance Directives complete  Time Spent: 45 minutes Greater than 50% of the time was spent in counseling and coordination of care ______________________________________________________________________________________ Michelle Ferolito Dubberly Palliative Medicine Team Team Cell Phone: [phone] Please utilize secure chat with additional questions, if there is no response within 30 minutes please call the above phone number  Palliative Medicine Team providers are available by phone from 7am to 7pm daily and can be reached through the team cell phone.  Should this patient require assistance outside of these hours, please call the patient's attending physician.     

## 2021-09-04 ENCOUNTER — Other Ambulatory Visit (HOSPITAL_COMMUNITY): Payer: Self-pay

## 2021-09-04 LAB — CBC WITH DIFFERENTIAL/PLATELET
Abs Immature Granulocytes: 0 10*3/uL (ref 0.00–0.07)
Basophils Absolute: 0 10*3/uL (ref 0.0–0.1)
Basophils Relative: 2 %
Eosinophils Absolute: 0 10*3/uL (ref 0.0–0.5)
Eosinophils Relative: 0 %
HCT: 28.6 % — ABNORMAL LOW (ref 36.0–46.0)
Hemoglobin: 9.6 g/dL — ABNORMAL LOW (ref 12.0–15.0)
Lymphocytes Relative: 36 %
Lymphs Abs: 0.7 10*3/uL (ref 0.7–4.0)
MCH: 32.9 pg (ref 26.0–34.0)
MCHC: 33.6 g/dL (ref 30.0–36.0)
MCV: 97.9 fL (ref 80.0–100.0)
Monocytes Absolute: 0.1 10*3/uL (ref 0.1–1.0)
Monocytes Relative: 5 %
Neutro Abs: 1.1 10*3/uL — ABNORMAL LOW (ref 1.7–7.7)
Neutrophils Relative %: 57 %
Platelets: 426 10*3/uL — ABNORMAL HIGH (ref 150–400)
RBC: 2.92 MIL/uL — ABNORMAL LOW (ref 3.87–5.11)
RDW: 15.9 % — ABNORMAL HIGH (ref 11.5–15.5)
WBC: 2 10*3/uL — ABNORMAL LOW (ref 4.0–10.5)
nRBC: 0 % (ref 0.0–0.2)
nRBC: 0 /100 WBC

## 2021-09-04 LAB — BASIC METABOLIC PANEL
Anion gap: 4 — ABNORMAL LOW (ref 5–15)
BUN: 14 mg/dL (ref 6–20)
CO2: 28 mmol/L (ref 22–32)
Calcium: 9.3 mg/dL (ref 8.9–10.3)
Chloride: 103 mmol/L (ref 98–111)
Creatinine, Ser: 0.77 mg/dL (ref 0.44–1.00)
GFR, Estimated: >60 mL/min (ref >60)
Glucose, Bld: 86 mg/dL (ref 70–99)
Potassium: 4.4 mmol/L (ref 3.5–5.1)
Sodium: 135 mmol/L (ref 135–145)

## 2021-09-04 LAB — MAGNESIUM: Magnesium: 2.4 mg/dL (ref 1.7–2.4)

## 2021-09-04 MED ORDER — LEVOFLOXACIN 750 MG PO TABS
750.0000 mg | ORAL_TABLET | Freq: Every day | ORAL | 0 refills | Status: AC
  Filled 2021-09-04: qty 5, 5d supply, fill #0

## 2021-09-04 MED ORDER — ONDANSETRON 4 MG PO TBDP
4.0000 mg | ORAL_TABLET | Freq: Three times a day (TID) | ORAL | 0 refills | Status: DC | PRN
  Filled 2021-09-04: qty 20, 7d supply, fill #0

## 2021-09-04 MED ORDER — DOXYCYCLINE HYCLATE 100 MG PO TABS
100.0000 mg | ORAL_TABLET | Freq: Two times a day (BID) | ORAL | 0 refills | Status: AC
  Filled 2021-09-04: qty 10, 5d supply, fill #0

## 2021-09-04 NOTE — Progress Notes (Signed)
Mobility Specialist Progress Note:   09/04/21 1040  Mobility  Activity Ambulated in hall  Level of Assistance Independent  Assistive Device Four point cane  Distance Ambulated (ft) 570 ft  Mobility Ambulated independently in hallway  Mobility Response Tolerated well  Mobility performed by Mobility specialist  $Mobility charge 1 Mobility   Pt asx during ambulation. Will see for second session before d/c.  Nelta Numbers Mobility Specialist  Phone [phone]

## 2021-09-04 NOTE — TOC Transition Note (Signed)
Transition of Care [facility]) - CM/SW Discharge Note   Patient Details  Name: Dyasia Firestine MRN: [id number] Date of Birth: Dec 11, 1970  Transition of Care [facility]) CM/SW Contact:  Bartholomew Crews, RN Phone Number: [phone] 09/04/2021, 11:17 AM   Clinical Narrative:     Patient to transition home today. No TOC needs identified at this time.   Final next level of care: Home/Self Care Barriers to Discharge: No Barriers Identified   Patient Goals and CMS Choice Patient states their goals for this hospitalization and ongoing recovery are:: return home CMS Medicare.gov Compare Post Acute Care list provided to:: Patient    Discharge Placement                       Discharge Plan and Services   Discharge Planning Services: CM Consult                                 Social Determinants of Health (SDOH) Interventions     Readmission Risk Interventions No flowsheet data found.

## 2021-09-04 NOTE — Progress Notes (Signed)
Mobility Specialist Progress Note:   09/04/21 1400  Mobility  Activity Ambulated in hall  Level of Assistance Independent  Assistive Device Four point cane  Distance Ambulated (ft) 560 ft  Mobility Ambulated independently in hallway  Mobility performed by Mobility specialist  $Mobility charge 1 Mobility   Pt asx during ambulation. No N/V, no pain.   Nelta Numbers Mobility Specialist  Phone [phone]

## 2021-09-04 NOTE — Progress Notes (Signed)
I was reported that patient had vomiting and could not tolerate her p.o. antibiotics.  She is not comfortable going home today.  Plan to keep her overnight.  Possible discharge tomorrow.

## 2021-09-04 NOTE — Discharge Summary (Signed)
Physician Discharge Summary  Misty Arnold OJJ:[id number] DOB: 06-22-1971 DOA: 08/25/2021  PCP: Nicholas Lose, MD  Admit date: 08/25/2021 Discharge date: 09/04/2021 30 Day Unplanned Readmission Risk Score    Flowsheet Row ED to Hosp-Admission (Current) from 08/25/2021 in [facility]  30 Day Unplanned Readmission Risk Score (%) 20.85 Filed at 09/04/2021 0801       This score is the patient's risk of an unplanned readmission within 30 days of being discharged (0 -100%). The score is based on dignosis, age, lab data, medications, orders, and past utilization.   Low:  0-14.9   Medium: 15-21.9   High: 22-29.9   Extreme: 30 and above          Admitted From: Home Disposition: Home  Recommendations for Outpatient Follow-up:  Follow up with PCP in 1-2 weeks Please obtain BMP/CBC in one week Follow-up with your oncologist Follow-up with your wound doctor as soon as possible Follow-up with ID in 1 week Please follow up with your PCP on the following pending results: Unresulted Labs (From admission, onward)    None         Home Health: None Equipment/Devices: None  Discharge Condition: Stable CODE STATUS: Full code Diet recommendation: Cardiac  Subjective: Seen and examined.  Feels much better.  In good spirits.  She tells me that she had some nausea yesterday since she was started on oral antibiotics but no vomiting.  She wants to see how she tolerates her breakfast before she can go today.  I offered her Zofran ODT as a backup option and she was very happy about that.  She tells me that the drainage from the wound has improved.  Brief/Interim Summary: Misty Arnold is a 50 y.o. female with medical history significant of HTN and metastatic breast cancer to bone presented with worsening right chest wall wound.  She seemed to be doing ok since her surgery.  Dr. Lindi Adie referred her to wound care.  Wound care sent her all the supplies she needed  the first time.  She required more supplies than were provided through Medicare.  2 weeks ago, she needed new supplies and the box came without everything needed.  When she took off the ABD pads it would stick to the wound which was causing bleeding and irritation.  She tried to get more supplies without success - Medicare wouldn't provide the supplies.  She started noticing a color change in her fluid and decided to come to the ER.  The fluid amount is increasing, needed to change ABD pads q5h.  It was previously light yellow and is now light green.  Dr. Geralyn Flash nurse recommended that she come to the ER.  She has had multiple surgeries.  In 2016, she had a TRAM procedure.  She was admitted to hospital service. -Patient was started on IV vancomycin, IV cefepime and metronidazole gel.  Wound care consulted.  Wound care resumed her outpatient wound dressings.  Due to concern of the need of debridement, general surgery was consulted however they opined that debridement may cause further worsening of wound which may not heal and that she does not need any debridement at this point in time.  ID was consulted who opined that patient's wound does not seem to have any sort of superinfection or bacterial infection and decided to discontinue antibiotics and watch.  Antibiotics discontinued on the evening of 08/28/2021.  If she were to develop fever, purulent discharge or erythema of the wound then  she will be candidate for antibiotics.  Patient claimed that her drainage was increased and became more greenish after stopping of the antibiotics.  Discussed with ID, they took a deep culture from the wound and reinitiated the antibiotics on 08/31/2021.  Patient claims that her drainage has improved.  ID recommended transitioning to oral doxycycline and Levaquin at the time of discharge to complete total 10 days of antibiotics.  Patient wanted to try oral antibiotics before discharge so she was started on those yesterday.  She  tolerated fairly well with some nausea but no vomiting.  She is agreeable to the discharge today.  She has been prescribed 5 more days of oral antibiotics.  We will follow-up with all her physicians and ID.   Metastatic breast cancer -She refused radiation treatment but did undergo limited palliative rads tx after spinal meds -She is currently on Zoladex, Ibrance, and Letrozole -Palliative care input is appreciated.  She was seen by chaplain as well. -She is a full code at this time Pain is controlled.  She received Zoladex on 08/29/2021 as inpatient.   HTN -Controlled on Norvasc.     Hypokalemia: Resolved.  Discharge Diagnoses:  Principal Problem:   Infected wound Active Problems:   Breast cancer of upper-outer quadrant of right female breast ([place])   Metastasis of neoplasm to spinal canal [facility])   Essential hypertension   Breast wound    Discharge Instructions   Allergies as of 09/04/2021       Reactions   Amoxicillin    Other reaction(s): rash/itching   Penicillins Rash   Did it involve swelling of the face/tongue/throat, SOB, or low BP? no Did it involve sudden or severe rash/hives, skin peeling, or any reaction on the inside of your mouth or nose? Yes Did you need to seek medical attention at a hospital or doctor's office? No When did it last happen? early 20's     If all above answers are "NO", may proceed with cephalosporin use.   Pork-derived Products    Penicillin G    Tamoxifen Hypertension        Medication List     TAKE these medications    acetaminophen 325 MG tablet Commonly known as: TYLENOL Take 2 tablets (650 mg total) by mouth every 4 (four) hours as needed for mild pain ((score 1 to 3) or temp > 100.5).   amLODipine 10 MG tablet Commonly known as: NORVASC Take 1 tablet (10 mg total) by mouth daily.   bisacodyl 10 MG suppository Commonly known as: DULCOLAX Place 1 suppository (10 mg total) rectally daily as needed for moderate  constipation.   dexamethasone 1 MG tablet Commonly known as: DECADRON Take 1 mg by mouth daily as needed (when appetite  is low).   diazepam 5 MG tablet Commonly known as: VALIUM Take 1 tablet (5 mg total) by mouth every 12 (twelve) hours as needed for muscle spasms.   doxycycline 100 MG tablet Commonly known as: VIBRA-TABS Take 1 tablet (100 mg total) by mouth every 12 (twelve) hours for 5 days.   Flaxseed Oil Oil Take 5 mLs by mouth in the morning, at noon, and at bedtime.   goserelin 3.6 MG injection Commonly known as: ZOLADEX Inject 3.6 mg into the skin every 28 (twenty-eight) days.   Ibrance 125 MG tablet Generic drug: palbociclib TAKE 1 TABLET (125 MG TOTAL) BY MOUTH DAILY. TAKE FOR 21 DAYS ON, 7 DAYS OFF, REPEAT EVERY 28 DAYS. What changed:  how much to take  when to take this additional instructions   letrozole 2.5 MG tablet Commonly known as: FEMARA TAKE 1 TABLET BY MOUTH EVERY DAY   levofloxacin 750 MG tablet Commonly known as: LEVAQUIN Take 1 tablet (750 mg total) by mouth daily for 5 days. Start taking on: September 05, 2021   lidocaine-prilocaine cream Commonly known as: EMLA APPLY TOPICALLY AS NEEDED. What changed:  how much to take how to take this when to take this additional instructions   ondansetron 4 MG disintegrating tablet Commonly known as: Zofran ODT Take 1 tablet (4 mg total) by mouth every 8 (eight) hours as needed for nausea or vomiting.   Oxycodone HCl 10 MG Tabs Take 1 tablet (10 mg total) by mouth every 3 (three) hours as needed ((score 7 to 10)).   Oyster Shell Calcium w/D 500-200 MG-UNIT Tabs TAKE 1 TABLET BY MOUTH TWICE A DAY WITH MEALS   polyethylene glycol 17 g packet Commonly known as: MIRALAX / GLYCOLAX Take 17 g by mouth daily as needed for mild constipation.   VITAMIN C PO Take 1,000 mg by mouth daily.   Vitamin D3 125 MCG (5000 UT) Caps TAKE 1 CAPSULE (5,000 UNITS TOTAL) BY MOUTH 2 (TWO) TIMES DAILY. What changed:  See the new instructions.        Follow-up Information     [facility] .   Specialty: Emergency Medicine Why: As needed, If symptoms worsen Contact information: [address] [id number] [place]        Nicholas Lose, MD Follow up in 1 week(s).   Specialty: Hematology and Oncology Contact information: [place] [id number] [phone]         Carlyle Basques, MD Follow up in 1 week(s).   Specialty: Infectious Diseases Contact information: [place] [address] [place] [address] [phone]                Allergies  Allergen Reactions   Amoxicillin     Other reaction(s): rash/itching   Penicillins Rash    Did it involve swelling of the face/tongue/throat, SOB, or low BP? no Did it involve sudden or severe rash/hives, skin peeling, or any reaction on the inside of your mouth or nose? Yes Did you need to seek medical attention at a hospital or doctor's office? No When did it last happen? early 20's     If all above answers are "NO", may proceed with cephalosporin use.   Pork-Derived Products    Penicillin G    Tamoxifen Hypertension    Consultations: Oncology, wound care and ID   Procedures/Studies: No results found.   Discharge Exam: Vitals:   09/04/21 0555 09/04/21 0823  BP: 116/74 124/85  Pulse: 78 98  Resp:  16  Temp: 98.6 F (37 C) 99.5 F (37.5 C)  SpO2: 100% 100%   Vitals:   09/03/21 1546 09/03/21 2016 09/04/21 0555 09/04/21 0823  BP: 103/76 117/85 116/74 124/85  Pulse: (!) 104 (!) 106 78 98  Resp: 18 18  16   Temp: 97.6 F (36.4 C) 98.9 F (37.2 C) 98.6 F (37 C) 99.5 F (37.5 C)  TempSrc: Oral Oral Oral Oral  SpO2: 100% 100% 100% 100%  Weight:      Height:        General: Pt is alert, awake, not in acute distress Cardiovascular: RRR, S1/S2 +, no rubs, no gallops Respiratory: CTA bilaterally,  no wheezing, no rhonchi Abdominal: Soft,  NT, ND, bowel sounds + Extremities: no edema, no cyanosis    The results of significant diagnostics from this hospitalization (including imaging, microbiology, ancillary and laboratory) are listed below for reference.     Microbiology: Recent Results (from the past 240 hour(s))  Resp Panel by RT-PCR (Flu A&B, Covid) Nasopharyngeal Swab     Status: None   Collection Time: 08/26/21  8:10 AM   Specimen: Nasopharyngeal Swab; Nasopharyngeal(NP) swabs in vial transport medium  Result Value Ref Range Status   SARS Coronavirus 2 by RT PCR NEGATIVE NEGATIVE Final    Comment: (NOTE) SARS-CoV-2 target nucleic acids are NOT DETECTED.  The SARS-CoV-2 RNA is generally detectable in upper respiratory specimens during the acute phase of infection. The lowest concentration of SARS-CoV-2 viral copies this assay can detect is 138 copies/mL. A negative result does not preclude SARS-Cov-2 infection and should not be used as the sole basis for treatment or other patient management decisions. A negative result may occur with  improper specimen collection/handling, submission of specimen other than nasopharyngeal swab, presence of viral mutation(s) within the areas targeted by this assay, and inadequate number of viral copies(<138 copies/mL). A negative result must be combined with clinical observations, patient history, and epidemiological information. The expected result is Negative.  Fact Sheet for Patients:  EntrepreneurPulse.com.au  Fact Sheet for Healthcare Providers:  IncredibleEmployment.be  This test is no t yet approved or cleared by the [place] FDA and  has been authorized for detection and/or diagnosis of SARS-CoV-2 by FDA under an Emergency Use Authorization (EUA). This EUA will remain  in effect (meaning this test can be used) for the duration of the COVID-19 declaration under Section 564(b)(1) of the  Act, 21 U.S.C.section 360bbb-3(b)(1), unless the authorization is terminated  or revoked sooner.       Influenza A by PCR NEGATIVE NEGATIVE Final   Influenza B by PCR NEGATIVE NEGATIVE Final    Comment: (NOTE) The Xpert Xpress SARS-CoV-2/FLU/RSV plus assay is intended as an aid in the diagnosis of influenza from Nasopharyngeal swab specimens and should not be used as a sole basis for treatment. Nasal washings and aspirates are unacceptable for Xpert Xpress SARS-CoV-2/FLU/RSV testing.  Fact Sheet for Patients: EntrepreneurPulse.com.au  Fact Sheet for Healthcare Providers: IncredibleEmployment.be  This test is not yet approved or cleared by the [place] FDA and has been authorized for detection and/or diagnosis of SARS-CoV-2 by FDA under an Emergency Use Authorization (EUA). This EUA will remain in effect (meaning this test can be used) for the duration of the COVID-19 declaration under Section 564(b)(1) of the Act, 21 U.S.C. section 360bbb-3(b)(1), unless the authorization is terminated or revoked.  Performed at [facility], [place] [address] [place], [place] [address]      Labs: BNP (last 3 results) Recent Labs    05/11/21 2124  BNP 78.2   Basic Metabolic Panel: Recent Labs  Lab 08/29/21 0832 08/30/21 0209 08/31/21 0055 09/01/21 0201 09/04/21 0104  NA 136 139 138 135 135  K 3.5 3.6 3.4* 3.9 4.4  CL 100 105 105 105 103  CO2 29 29 26 26 28   GLUCOSE 119* 120* 125* 111* 86  BUN 8 11 13 16 14   CREATININE 0.87 0.81 0.77 0.66 0.77  CALCIUM 9.5 8.9 8.7* 9.0 9.3  MG 2.1  --   --   --  2.4   Liver Function Tests: No results for input(s): AST, ALT, ALKPHOS, BILITOT, PROT, ALBUMIN in the last 168 hours. No results for  input(s): LIPASE, AMYLASE in the last 168 hours. No results for input(s): AMMONIA in the last 168 hours. CBC: Recent Labs  Lab 08/29/21 0832 08/30/21 0209 08/31/21 0055 09/01/21 0201 09/04/21 0104   WBC 2.4* 1.5* 2.5* 2.8* 2.0*  NEUTROABS 1.8 0.8* 1.9 2.3 1.1*  HGB 10.4* 9.0* 9.6* 9.6* 9.6*  HCT 30.8* 27.1* 28.7* 28.4* 28.6*  MCV 96.0 96.8 97.0 96.9 97.9  PLT 318 296 350 357 426*   Cardiac Enzymes: No results for input(s): CKTOTAL, CKMB, CKMBINDEX, TROPONINI in the last 168 hours. BNP: Invalid input(s): POCBNP CBG: No results for input(s): GLUCAP in the last 168 hours. D-Dimer No results for input(s): DDIMER in the last 72 hours. Hgb A1c No results for input(s): HGBA1C in the last 72 hours. Lipid Profile No results for input(s): CHOL, HDL, LDLCALC, TRIG, CHOLHDL, LDLDIRECT in the last 72 hours. Thyroid function studies No results for input(s): TSH, T4TOTAL, T3FREE, THYROIDAB in the last 72 hours.  Invalid input(s): FREET3 Anemia work up No results for input(s): VITAMINB12, FOLATE, FERRITIN, TIBC, IRON, RETICCTPCT in the last 72 hours. Urinalysis    Component Value Date/Time   COLORURINE YELLOW 05/11/2021 2323   APPEARANCEUR CLEAR 05/11/2021 2323   LABSPEC 1.020 05/11/2021 2323   PHURINE 5.0 05/11/2021 2323   GLUCOSEU NEGATIVE 05/11/2021 2323   HGBUR NEGATIVE 05/11/2021 2323   BILIRUBINUR NEGATIVE 05/11/2021 2323   KETONESUR 5 (A) 05/11/2021 2323   PROTEINUR NEGATIVE 05/11/2021 2323   NITRITE NEGATIVE 05/11/2021 2323   LEUKOCYTESUR NEGATIVE 05/11/2021 2323   Sepsis Labs Invalid input(s): PROCALCITONIN,  WBC,  LACTICIDVEN Microbiology Recent Results (from the past 240 hour(s))  Resp Panel by RT-PCR (Flu A&B, Covid) Nasopharyngeal Swab     Status: None   Collection Time: 08/26/21  8:10 AM   Specimen: Nasopharyngeal Swab; Nasopharyngeal(NP) swabs in vial transport medium  Result Value Ref Range Status   SARS Coronavirus 2 by RT PCR NEGATIVE NEGATIVE Final    Comment: (NOTE) SARS-CoV-2 target nucleic acids are NOT DETECTED.  The SARS-CoV-2 RNA is generally detectable in upper respiratory specimens during the acute phase of infection. The lowest concentration of  SARS-CoV-2 viral copies this assay can detect is 138 copies/mL. A negative result does not preclude SARS-Cov-2 infection and should not be used as the sole basis for treatment or other patient management decisions. A negative result may occur with  improper specimen collection/handling, submission of specimen other than nasopharyngeal swab, presence of viral mutation(s) within the areas targeted by this assay, and inadequate number of viral copies(<138 copies/mL). A negative result must be combined with clinical observations, patient history, and epidemiological information. The expected result is Negative.  Fact Sheet for Patients:  EntrepreneurPulse.com.au  Fact Sheet for Healthcare Providers:  IncredibleEmployment.be  This test is no t yet approved or cleared by the [place] FDA and  has been authorized for detection and/or diagnosis of SARS-CoV-2 by FDA under an Emergency Use Authorization (EUA). This EUA will remain  in effect (meaning this test can be used) for the duration of the COVID-19 declaration under Section 564(b)(1) of the Act, 21 U.S.C.section 360bbb-3(b)(1), unless the authorization is terminated  or revoked sooner.       Influenza A by PCR NEGATIVE NEGATIVE Final   Influenza B by PCR NEGATIVE NEGATIVE Final    Comment: (NOTE) The Xpert Xpress SARS-CoV-2/FLU/RSV plus assay is intended as an aid in the diagnosis of influenza from Nasopharyngeal swab specimens and should not be used as a sole basis for treatment. Nasal washings  and aspirates are unacceptable for Xpert Xpress SARS-CoV-2/FLU/RSV testing.  Fact Sheet for Patients: EntrepreneurPulse.com.au  Fact Sheet for Healthcare Providers: IncredibleEmployment.be  This test is not yet approved or cleared by the [place] FDA and has been authorized for detection and/or diagnosis of SARS-CoV-2 by FDA under an Emergency Use  Authorization (EUA). This EUA will remain in effect (meaning this test can be used) for the duration of the COVID-19 declaration under Section 564(b)(1) of the Act, 21 U.S.C. section 360bbb-3(b)(1), unless the authorization is terminated or revoked.  Performed at [facility], [place] [address] [place], [place] [address]      Time coordinating discharge: Over 30 minutes  SIGNED:   Darliss Cheney, MD  [facility] 09/04/2021, 10:41 AM  If 7PM-7AM, please contact night-coverage [email or web address]

## 2021-09-05 NOTE — Progress Notes (Signed)
Mobility Specialist Progress Note:   09/05/21 1430  Mobility  Activity Ambulated in hall  Level of Assistance Independent  Assistive Device Four point cane  Distance Ambulated (ft) 570 ft  Mobility Ambulated independently in hallway  Mobility Response Tolerated well  Mobility performed by Mobility specialist  $Mobility charge 1 Mobility   Second mobility session today. Pt asx during ambulation, excited for d/c.  Nelta Numbers Mobility Specialist  Phone [phone]

## 2021-09-05 NOTE — Discharge Summary (Signed)
Physician Discharge Summary  Veralyn Lopp FIE:[id number] DOB: 09/16/1971 DOA: 08/25/2021  PCP: Nicholas Lose, MD  Admit date: 08/25/2021 Discharge date: 09/05/2021 30 Day Unplanned Readmission Risk Score    Flowsheet Row ED to Hosp-Admission (Current) from 08/25/2021 in [facility]  30 Day Unplanned Readmission Risk Score (%) 20.85 Filed at 09/04/2021 0801       This score is the patient's risk of an unplanned readmission within 30 days of being discharged (0 -100%). The score is based on dignosis, age, lab data, medications, orders, and past utilization.   Low:  0-14.9   Medium: 15-21.9   High: 22-29.9   Extreme: 30 and above          Admitted From: Home Disposition: Home  Recommendations for Outpatient Follow-up:  Follow up with PCP in 1-2 weeks Please obtain BMP/CBC in one week Follow-up with your oncologist Follow-up with your wound doctor as soon as possible Follow-up with ID in 1 week Please follow up with your PCP on the following pending results: Unresulted Labs (From admission, onward)    None         Home Health: None Equipment/Devices: None  Discharge Condition: Stable CODE STATUS: Full code Diet recommendation: Cardiac  Subjective: Seen and examined.  Feels much better.  In good spirits.  She tells me that she had some nausea yesterday since she was started on oral antibiotics but no vomiting.  She wants to see how she tolerates her breakfast before she can go today.  I offered her Zofran ODT as a backup option and she was very happy about that.  She tells me that the drainage from the wound has improved.  Brief/Interim Summary: Misty Arnold is a 50 y.o. female with medical history significant of HTN and metastatic breast cancer to bone presented with worsening right chest wall wound.  She seemed to be doing ok since her surgery.  Dr. Lindi Adie referred her to wound care.  Wound care sent her all the supplies she needed  the first time.  She required more supplies than were provided through Medicare.  2 weeks ago, she needed new supplies and the box came without everything needed.  When she took off the ABD pads it would stick to the wound which was causing bleeding and irritation.  She tried to get more supplies without success - Medicare wouldn't provide the supplies.  She started noticing a color change in her fluid and decided to come to the ER.  The fluid amount is increasing, needed to change ABD pads q5h.  It was previously light yellow and is now light green.  Dr. Geralyn Flash nurse recommended that she come to the ER.  She has had multiple surgeries.  In 2016, she had a TRAM procedure.  She was admitted to hospital service. -Patient was started on IV vancomycin, IV cefepime and metronidazole gel.  Wound care consulted.  Wound care resumed her outpatient wound dressings.  Due to concern of the need of debridement, general surgery was consulted however they opined that debridement may cause further worsening of wound which may not heal and that she does not need any debridement at this point in time.  ID was consulted who opined that patient's wound does not seem to have any sort of superinfection or bacterial infection and decided to discontinue antibiotics and watch.  Antibiotics discontinued on the evening of 08/28/2021.  If she were to develop fever, purulent discharge or erythema of the wound then  she will be candidate for antibiotics.  Patient claimed that her drainage was increased and became more greenish after stopping of the antibiotics.  Discussed with ID, they took a deep culture from the wound and reinitiated the antibiotics on 08/31/2021.  Patient claims that her drainage has improved.  ID recommended transitioning to oral doxycycline and Levaquin at the time of discharge to complete total 10 days of antibiotics.  Patient wanted to try oral antibiotics before discharge so she was started on those yesterday.  She  tolerated fairly well with some nausea but no vomiting.  She is agreeable to the discharge today.  She has been prescribed 5 more days of oral antibiotics.  We will follow-up with all her physicians and ID.   Metastatic breast cancer -She refused radiation treatment but did undergo limited palliative rads tx after spinal meds -She is currently on Zoladex, Ibrance, and Letrozole -Palliative care input is appreciated.  She was seen by chaplain as well. -She is a full code at this time Pain is controlled.  She received Zoladex on 08/29/2021 as inpatient.   HTN -Controlled on Norvasc.     Hypokalemia: Resolved.  ADDENDUM: Patient was initially discharged yesterday but after discharge paperwork was completed, patient experienced vomiting after taking her antibiotics, she did not feel that she would be able to tolerate antibiotic so was not comfortable going home, discharge was held, she is feeling better today.  She is able to tolerate antibiotics without any nausea.  She is willing to go home.  She is being discharged in stable condition.  No change from yesterday.  Discharge Diagnoses:  Principal Problem:   Infected wound Active Problems:   Breast cancer of upper-outer quadrant of right female breast ([place])   Metastasis of neoplasm to spinal canal [facility])   Essential hypertension   Breast wound    Discharge Instructions   Allergies as of 09/05/2021       Reactions   Amoxicillin    Other reaction(s): rash/itching   Penicillins Rash   Did it involve swelling of the face/tongue/throat, SOB, or low BP? no Did it involve sudden or severe rash/hives, skin peeling, or any reaction on the inside of your mouth or nose? Yes Did you need to seek medical attention at a hospital or doctor's office? No When did it last happen? early 20's     If all above answers are "NO", may proceed with cephalosporin use.   Pork-derived Products    Penicillin G    Tamoxifen Hypertension        Medication  List     TAKE these medications    acetaminophen 325 MG tablet Commonly known as: TYLENOL Take 2 tablets (650 mg total) by mouth every 4 (four) hours as needed for mild pain ((score 1 to 3) or temp > 100.5).   amLODipine 10 MG tablet Commonly known as: NORVASC Take 1 tablet (10 mg total) by mouth daily.   bisacodyl 10 MG suppository Commonly known as: DULCOLAX Place 1 suppository (10 mg total) rectally daily as needed for moderate constipation.   dexamethasone 1 MG tablet Commonly known as: DECADRON Take 1 mg by mouth daily as needed (when appetite  is low).   diazepam 5 MG tablet Commonly known as: VALIUM Take 1 tablet (5 mg total) by mouth every 12 (twelve) hours as needed for muscle spasms.   doxycycline 100 MG tablet Commonly known as: VIBRA-TABS Take 1 tablet (100 mg total) by mouth every 12 (twelve) hours for 5  days.   Flaxseed Oil Oil Take 5 mLs by mouth in the morning, at noon, and at bedtime.   goserelin 3.6 MG injection Commonly known as: ZOLADEX Inject 3.6 mg into the skin every 28 (twenty-eight) days.   Ibrance 125 MG tablet Generic drug: palbociclib TAKE 1 TABLET (125 MG TOTAL) BY MOUTH DAILY. TAKE FOR 21 DAYS ON, 7 DAYS OFF, REPEAT EVERY 28 DAYS. What changed:  how much to take when to take this additional instructions   letrozole 2.5 MG tablet Commonly known as: FEMARA TAKE 1 TABLET BY MOUTH EVERY DAY   levofloxacin 750 MG tablet Commonly known as: LEVAQUIN Take 1 tablet (750 mg total) by mouth daily for 5 days.   lidocaine-prilocaine cream Commonly known as: EMLA APPLY TOPICALLY AS NEEDED. What changed:  how much to take how to take this when to take this additional instructions   ondansetron 4 MG disintegrating tablet Commonly known as: Zofran ODT Take 1 tablet (4 mg total) by mouth every 8 (eight) hours as needed for nausea or vomiting.   Oxycodone HCl 10 MG Tabs Take 1 tablet (10 mg total) by mouth every 3 (three) hours as needed  ((score 7 to 10)).   Oyster Shell Calcium w/D 500-200 MG-UNIT Tabs TAKE 1 TABLET BY MOUTH TWICE A DAY WITH MEALS   polyethylene glycol 17 g packet Commonly known as: MIRALAX / GLYCOLAX Take 17 g by mouth daily as needed for mild constipation.   VITAMIN C PO Take 1,000 mg by mouth daily.   Vitamin D3 125 MCG (5000 UT) Caps TAKE 1 CAPSULE (5,000 UNITS TOTAL) BY MOUTH 2 (TWO) TIMES DAILY. What changed: See the new instructions.        Follow-up Information     [facility] .   Specialty: Emergency Medicine Why: As needed, If symptoms worsen Contact information: [address] [id number] [place]        Nicholas Lose, MD Follow up in 1 week(s).   Specialty: Hematology and Oncology Contact information: [place] [address] [phone]         Carlyle Basques, MD Follow up in 1 week(s).   Specialty: Infectious Diseases Contact information: [address] [place] [address] [phone]                Allergies  Allergen Reactions   Amoxicillin     Other reaction(s): rash/itching   Penicillins Rash    Did it involve swelling of the face/tongue/throat, SOB, or low BP? no Did it involve sudden or severe rash/hives, skin peeling, or any reaction on the inside of your mouth or nose? Yes Did you need to seek medical attention at a hospital or doctor's office? No When did it last happen? early 20's     If all above answers are "NO", may proceed with cephalosporin use.   Pork-Derived Products    Penicillin G    Tamoxifen Hypertension    Consultations: Oncology, wound care and ID   Procedures/Studies: No results found.   Discharge Exam: Vitals:   09/05/21 0407 09/05/21 0814  BP: 124/85 114/84  Pulse: 88 73  Resp:  17  Temp: 98.1 F (36.7 C) 98.6 F (37 C)  SpO2: 100% 99%   Vitals:   09/04/21 1627 09/04/21  2017 09/05/21 0407 09/05/21 0814  BP: 111/75 123/83 124/85 114/84  Pulse: 93 87 88 73  Resp: 16 16  17   Temp: 98.2  F (36.8 C) 98.2 F (36.8 C) 98.1 F (36.7 C) 98.6 F (37 C)  TempSrc: Oral Oral Oral Oral  SpO2: 100% 100% 100% 99%  Weight:      Height:        General: Pt is alert, awake, not in acute distress Cardiovascular: RRR, S1/S2 +, no rubs, no gallops Respiratory: CTA bilaterally, no wheezing, no rhonchi Abdominal: Soft, NT, ND, bowel sounds + Extremities: no edema, no cyanosis    The results of significant diagnostics from this hospitalization (including imaging, microbiology, ancillary and laboratory) are listed below for reference.     Microbiology: No results found for this or any previous visit (from the past 240 hour(s)).    Labs: BNP (last 3 results) Recent Labs    05/11/21 2124  BNP 03.4   Basic Metabolic Panel: Recent Labs  Lab 08/30/21 0209 08/31/21 0055 09/01/21 0201 09/04/21 0104  NA 139 138 135 135  K 3.6 3.4* 3.9 4.4  CL 105 105 105 103  CO2 29 26 26 28   GLUCOSE 120* 125* 111* 86  BUN 11 13 16 14   CREATININE 0.81 0.77 0.66 0.77  CALCIUM 8.9 8.7* 9.0 9.3  MG  --   --   --  2.4   Liver Function Tests: No results for input(s): AST, ALT, ALKPHOS, BILITOT, PROT, ALBUMIN in the last 168 hours. No results for input(s): LIPASE, AMYLASE in the last 168 hours. No results for input(s): AMMONIA in the last 168 hours. CBC: Recent Labs  Lab 08/30/21 0209 08/31/21 0055 09/01/21 0201 09/04/21 0104  WBC 1.5* 2.5* 2.8* 2.0*  NEUTROABS 0.8* 1.9 2.3 1.1*  HGB 9.0* 9.6* 9.6* 9.6*  HCT 27.1* 28.7* 28.4* 28.6*  MCV 96.8 97.0 96.9 97.9  PLT 296 350 357 426*   Cardiac Enzymes: No results for input(s): CKTOTAL, CKMB, CKMBINDEX, TROPONINI in the last 168 hours. BNP: Invalid input(s): POCBNP CBG: No results for input(s): GLUCAP in the last 168 hours. D-Dimer No results for input(s): DDIMER in the last 72 hours. Hgb A1c No results for  input(s): HGBA1C in the last 72 hours. Lipid Profile No results for input(s): CHOL, HDL, LDLCALC, TRIG, CHOLHDL, LDLDIRECT in the last 72 hours. Thyroid function studies No results for input(s): TSH, T4TOTAL, T3FREE, THYROIDAB in the last 72 hours.  Invalid input(s): FREET3 Anemia work up No results for input(s): VITAMINB12, FOLATE, FERRITIN, TIBC, IRON, RETICCTPCT in the last 72 hours. Urinalysis    Component Value Date/Time   COLORURINE YELLOW 05/11/2021 2323   APPEARANCEUR CLEAR 05/11/2021 2323   LABSPEC 1.020 05/11/2021 2323   PHURINE 5.0 05/11/2021 2323   GLUCOSEU NEGATIVE 05/11/2021 2323   HGBUR NEGATIVE 05/11/2021 2323   BILIRUBINUR NEGATIVE 05/11/2021 2323   KETONESUR 5 (A) 05/11/2021 2323   PROTEINUR NEGATIVE 05/11/2021 2323   NITRITE NEGATIVE 05/11/2021 2323   LEUKOCYTESUR NEGATIVE 05/11/2021 2323   Sepsis Labs Invalid input(s): PROCALCITONIN,  WBC,  LACTICIDVEN Microbiology No results found for this or any previous visit (from the past 240 hour(s)).    Time coordinating discharge: Over 30 minutes  SIGNED:   Darliss Cheney, MD  [facility] 09/05/2021, 12:40 PM  If 7PM-7AM, please contact night-coverage [email or web address]

## 2021-09-05 NOTE — Consult Note (Signed)
[facility] CM Inpatient Consult   09/05/2021  Misty Arnold Aug 14, 1971 [id number]  [place] Management   Patient screened for hospitalization with noted extreme high risk score for unplanned readmission for post hospital care needs. No noted primary care physician on file. [facility] CM works with primary provider office to assist with care coordination needs. [facility] Management does not follow post hospital.  Netta Cedars, MSN, RN [facility] Phone [phone]  Toll free office [phone]

## 2021-09-05 NOTE — Progress Notes (Signed)
Mobility Specialist Progress Note:   09/05/21 1045  Mobility  Activity Ambulated in hall  Level of Assistance Independent  Assistive Device Four point cane  Distance Ambulated (ft) 570 ft  Mobility Ambulated independently in hallway  Mobility Response Tolerated well  Mobility performed by Mobility specialist  $Mobility charge 1 Mobility   Pt asx during ambulation, no N/V, no pain. Will see for second mobility session later today if schedule permits.   Nelta Numbers Mobility Specialist  Phone [phone]

## 2021-09-05 NOTE — Care Management Important Message (Signed)
Important Message  Patient Details  Name: Misty Arnold MRN: [id number] Date of Birth: [birth year]   Medicare Important Message Given:  Yes     Benett Swoyer 09/05/2021, 4:30 PM

## 2021-09-05 NOTE — Progress Notes (Signed)
Medications returned to patient from pharmacy. Patient given medication for home. Discharge instructions given to patient. IV removed, patient discharged

## 2021-09-07 ENCOUNTER — Telehealth: Payer: Self-pay

## 2021-09-07 NOTE — Telephone Encounter (Signed)
Returned call to pt regarding VM left.  Pt concerns for survival chances, questions regarding treatment plan and overall goals, requested appointment with MD.  I scheduled visit for pt to meet virtually with Dr. Lindi Adie on 11/7

## 2021-09-09 NOTE — Progress Notes (Signed)
HEMATOLOGY-ONCOLOGY TELEPHONE VISIT PROGRESS NOTE  MyChart video visit did not work so ended up making a phone call instead.  CHIEF COMPLIANT: Follow-up of metastatic breast cancer  INTERVAL HISTORY: Misty Arnold is a 50 y.o. female with above-mentioned history of metastatic breast cancer who was progressing on hormone therapy treatments and was recommended Enhertu but she declined. She presents via MyChart today for follow-up.  She does feel that since she left the hospital she has been resting for up to 10 hours a day.  Pain is more manageable.  Denies any fevers or chills.  Oncology History  Breast cancer of upper-outer quadrant of right female breast ([place])  09/21/2010 Mammogram   right breast linear and segmental pleomorphic calcifications from 4:00 to 6:00 position ultrasound revealed 1.6 cm and 1.1 cm masses   09/27/2010 Initial Biopsy   ultrasound-guided biopsy of all masses showed DCIS grade 2; patient went to cancer treatment centers of [place] for second opinion and delayed therapy   01/26/2011 Surgery   right mastectomy followed by reconstruction: 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 But she could not afford these trips.   01/08/2014 Breast MRI   right breast multiple enhancing masses within the soft tissues largest 5.6 cm in wall skin surface and the capsule of the silicone prosthesis, contiguous nodules involving in inferomedial breast and tired and subcentimeter nodules across the midline   08/29/2015 Surgery   Right mastectomy: IDC with invol  of skin and skin ulceration, breast capsule inv cancer, superior medial margin positive, 1/1 LN positive, grade 3, 14.3 cm, 3.5 cm, ALI, chest wall involv, ER 90-100%, PR 80-90%, HER-2 negative, Ki 67 40-50% T4CN1 (St 3B)   03/05/2016 -  Anti-estrogen oral therapy   Tamoxifen 20 mg daily stopped due to headache and uncontrolled hypertension. Decrease to 10 mg daily 04/03/2016, stopped June 2017 and took an estrogen metabolizer over-the-counter; started Aromasin April 2018 from a [place] physician, Zoladex started on 05/2017 and switched to Letrozole in 09/2017   01/04/2020 Surgery   Patient presented to the ED on 01/04/20 for worsening lower extremity numbness and underwent a decompressive laminectomy with tumor resection at T9 that was complicated with acute blood loss anemia and leukocytosis.   01/28/2020 - 02/15/2020 Radiation Therapy   Palliative radiation at site of thoracic tumor resection   03/03/2020 Miscellaneous    Ibrance with letrozole and Zoladex    Miscellaneous   Guardant 360: ESR 1 mutations, PI K3 CA mutation, EGFR mutation, T p53 mutation   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[place])  01/04/2020 Initial Diagnosis   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[place])     Observations/Objective:  There were no vitals filed for this visit. There is no height or weight on file to calculate BMI.  I have reviewed the data as listed CMP Latest Ref Rng & Units 09/04/2021 09/01/2021 08/31/2021  Glucose 70 - 99 mg/dL 86 111(H) 125(H)  BUN 6 - 20 mg/dL 14 16 13   Creatinine 0.44 - 1.00 mg/dL 0.77 0.66 0.77  Sodium 135 - 145 mmol/L 135 135 138  Potassium 3.5 - 5.1 mmol/L  4.4 3.9 3.4(L)  Chloride 98 - 111 mmol/L 103 105 105  CO2 22 - 32 mmol/L 28 26 26   Calcium 8.9 - 10.3 mg/dL 9.3 9.0 8.7(L)  Total Protein 6.5 - 8.1 g/dL - - -  Total Bilirubin 0.3 - 1.2 mg/dL - - -  Alkaline Phos 38 - 126 U/L - - -  AST 15 - 41 U/L - - -  ALT 0 - 44 U/L - - -    Lab Results  Component Value Date   WBC  2.0 (L) 09/04/2021   HGB 9.6 (L) 09/04/2021   HCT 28.6 (L) 09/04/2021   MCV 97.9 09/04/2021   PLT 426 (H) 09/04/2021   NEUTROABS 1.1 (L) 09/04/2021      Assessment Plan:  Breast cancer of upper-outer quadrant of right female breast ([place]) Recurrent right breast cancer initially DCIS treated with mastectomy followed by reconstruction and later developed chest wall recurrence in 2013 ER/PR positive HER-2 negative, patient underwent alternative therapies in [place] but could not afford the trips and hence she has not been on any breast cancer therapy for a long time.   Treatment summary: 1. Right mastectomy 08/29/2015 at [facility] (Dr.Singh): IDC with invol of skin and skin ulceration, breast capsule inv cancer, superior medial margin positive, 1/1 LN positive, grade 3, 14.3 cm, 3.5 cm, ALI, chest wall involv, ER 90-100%, PR 80-90%, HER-2 negative, Ki 67 40-50% T4CN1 (St 3B) 2. Patient started tamoxifen but developed severe headache with severe hypertension and tamoxifen was discontinued 03/17/2016.  3. 02/2017 started on Exemestane, Zoladex started 05/29/2017 4.  Hospitalization for paraplegia: T9 vertebral body compression status post decompressive laminectomy and tumor resection 01/04/2020 5.  Palliative radiation to the thoracic spine 01/28/2020 6.  Ibrance with letrozole started April 2021 discontinued 07/24/2021 7.  Enhertu started 07/31/2021 every 3 weeks  ----------------------------------------------------------------------------------------------------------------- 05/11/2021: CT angiogram: Metastatic breast cancer soft tissue breast masses greater on the right, axillary supraclavicular lymphadenopathy, multiple bone metastases   Hospitalization 08/25/2021-09/05/2021: Worsening right chest wall pain and infection (patient has appointments with palliative care as well as infectious disease)   Treatment plan: Patient decided not to initiate Enhertu at this time.  She will continue on Zoladex  injections for ovarian function suppression along with letrozole and Ibrance. She fully understands that this regimen does not appear to be controlling her disease but she does not want to change to any other regimen at this time.    I discussed the assessment and treatment plan with the patient. The patient was provided an opportunity to ask questions and all were answered. The patient agreed with the plan and demonstrated an understanding of the instructions. The patient was advised to call back or seek an in-person evaluation if the symptoms worsen or if the condition fails to improve as anticipated.   Total time spent: 12 minutes including face-to-face MyChart video visit time and time spent for planning, charting and coordination of care  Rulon Eisenmenger, MD 09/11/2021  I, Thana Ates am acting as scribe for Nicholas Lose, MD.  I have reviewed the above documentation for accuracy and completeness, and I agree with the above.

## 2021-09-11 ENCOUNTER — Inpatient Hospital Stay: Payer: Medicare PPO | Attending: Hematology and Oncology | Admitting: Hematology and Oncology

## 2021-09-11 ENCOUNTER — Telehealth: Payer: Self-pay

## 2021-09-11 ENCOUNTER — Other Ambulatory Visit (HOSPITAL_COMMUNITY): Payer: Self-pay

## 2021-09-11 ENCOUNTER — Encounter: Payer: Self-pay | Admitting: Hematology and Oncology

## 2021-09-11 DIAGNOSIS — C50411 Malignant neoplasm of upper-outer quadrant of right female breast: Secondary | ICD-10-CM | POA: Diagnosis not present

## 2021-09-11 DIAGNOSIS — Z170 Estrogen receptor positive status [ER+]: Secondary | ICD-10-CM | POA: Insufficient documentation

## 2021-09-11 DIAGNOSIS — C50919 Malignant neoplasm of unspecified site of unspecified female breast: Secondary | ICD-10-CM | POA: Diagnosis not present

## 2021-09-11 DIAGNOSIS — Z9011 Acquired absence of right breast and nipple: Secondary | ICD-10-CM | POA: Insufficient documentation

## 2021-09-11 DIAGNOSIS — C7951 Secondary malignant neoplasm of bone: Secondary | ICD-10-CM | POA: Insufficient documentation

## 2021-09-11 DIAGNOSIS — Z79899 Other long term (current) drug therapy: Secondary | ICD-10-CM | POA: Insufficient documentation

## 2021-09-11 NOTE — Telephone Encounter (Signed)
Oral Oncology Patient Advocate Encounter   Was successful in securing patient an $27 grant from Patient [facility]) to provide copayment coverage for Ibrance.  This will keep the out of pocket expense at $0.     I have spoken with the patient.    The billing information is as follows and has been shared with [place].   Member ID: [id number] Group ID: [id number] RxBin: [id number] Dates of Eligibility: 06/13/21 through 06/12/22  Fund:  [place] Patient [place] Phone [phone] Fax [phone] 09/11/2021 4:50 PM

## 2021-09-11 NOTE — Assessment & Plan Note (Signed)
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  Treatment summary: 1.Right mastectomy 08/29/2015 at [place] (Dr.Singh): IDC with invol of skin and skin ulceration, breast capsule inv cancer, superior medial margin positive, 1/1 LN positive, grade 3, 14.3 cm, 3.5 cm, ALI, chest wall involv, ER 90-100%, PR 80-90%, HER-2 negative, Ki 67 40-50% T4CN1 (St 3B) 2.Patient started tamoxifen but developed severe headache with severe hypertension and tamoxifen was discontinued 03/17/2016. 3.02/2017 started on Exemestane, Zoladex started 05/29/2017 4.Hospitalization for paraplegia: T9 vertebral body compression status post decompressive laminectomy and tumor resection 01/04/2020 5.Palliative radiation to the thoracic spine 01/28/2020 6.Ibrance with letrozole started April 2021discontinued 07/24/2021 7.Enhertu started 07/31/2021 every 3 weeks  ----------------------------------------------------------------------------------------------------------------- 05/11/2021: CT angiogram: Metastatic breast cancer soft tissue breast masses greater on the right, axillary supraclavicular lymphadenopathy, multiple bone metastases   Hospitalization 08/25/2021-09/05/2021: Worsening right chest wall pain and infection  Treatment plan:Patient decided not to initiate Enhertu at this time.  She will continue on Zoladex injections for ovarian function suppression along with letrozole and Ibrance. She fully understands that this regimen does not appear to be controlling her disease but she does not want to change to any other regimen at this time.

## 2021-09-12 ENCOUNTER — Inpatient Hospital Stay (HOSPITAL_BASED_OUTPATIENT_CLINIC_OR_DEPARTMENT_OTHER): Payer: Medicare PPO | Admitting: Nurse Practitioner

## 2021-09-12 DIAGNOSIS — R531 Weakness: Secondary | ICD-10-CM | POA: Diagnosis not present

## 2021-09-12 DIAGNOSIS — C50411 Malignant neoplasm of upper-outer quadrant of right female breast: Secondary | ICD-10-CM

## 2021-09-12 DIAGNOSIS — Z515 Encounter for palliative care: Secondary | ICD-10-CM

## 2021-09-12 DIAGNOSIS — Z170 Estrogen receptor positive status [ER+]: Secondary | ICD-10-CM | POA: Diagnosis not present

## 2021-09-12 DIAGNOSIS — C7949 Secondary malignant neoplasm of other parts of nervous system: Secondary | ICD-10-CM | POA: Diagnosis not present

## 2021-09-12 DIAGNOSIS — K5900 Constipation, unspecified: Secondary | ICD-10-CM

## 2021-09-12 DIAGNOSIS — G893 Neoplasm related pain (acute) (chronic): Secondary | ICD-10-CM

## 2021-09-12 NOTE — Progress Notes (Signed)
[place]  [phone] [phone]   Name: Misty Arnold Date: 09/12/2021 MRN: [id number]  DOB: 04/01/1971  Patient Care Team: Nicholas Lose, MD as PCP - General (Hematology and Oncology)    I connected with Misty Arnold on 09/12/21 at  2:45 PM EST by My Chart Video/Audio and verified that I am speaking with the correct person using two identifiers.   I discussed the limitations, risks, security and privacy concerns of performing an evaluation and management service by telemedicine and the availability of in-person appointments. I also discussed with the patient that there may be a patient responsible charge related to this service. The patient expressed understanding and agreed to proceed.   Other persons participating in the visit and their role in the encounter: N/A    Patient's location: Home   Provider's location: Office    Chief Complaint: Chronic cancer related pain, weakness    REASON FOR CONSULTATION: Misty Arnold is a 50 y.o. 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  Palliative ask to see for symptom management and goals of care.    SOCIAL HISTORY:     reports that she has never smoked. She has never used smokeless tobacco. She reports that she does not currently use alcohol. She reports that she does not use drugs.  ADVANCE DIRECTIVES:  Advance directives completed during recent hospitalization 08/27/2021.  Documents are on file and have been reviewed personally and [name].  CODE STATUS: Full code  PAST MEDICAL HISTORY: Past Medical History:  Diagnosis Date   Abnormal uterine bleeding    Anemia    Breast cancer ([place])    Depression    Fibroid    Hormone disorder    Hypertension     Infertility, female     PAST SURGICAL HISTORY:  Past Surgical History:  Procedure Laterality Date   ABDOMINAL HYSTERECTOMY     BREAST SURGERY     LAMINECTOMY N/A 01/04/2020   Procedure: Thoracic Eight, Thoracic Nine THORACIC LAMINECTOMY FOR TUMOR with Thoracic Seven-Thoracic Ten instrumentation;  Surgeon: [name], MD;  Location: [facility];  Service: Neurosurgery;  Laterality: N/A;  Thoracic Eight, Thoracic Nine THORACIC LAMINECTOMY FOR TUMOR with Thoracic Seven-Thoracic Ten instrumentation   MASTECTOMY     salivary gland stone      HEMATOLOGY/ONCOLOGY HISTORY:  Oncology History  Breast cancer of upper-outer quadrant of right female breast ([place])  09/21/2010 Mammogram   right breast linear and segmental pleomorphic calcifications from 4:00 to 6:00 position ultrasound revealed 1.6 cm and 1.1 cm masses   09/27/2010 Initial Biopsy   ultrasound-guided biopsy of all masses showed DCIS grade 2; patient went to cancer treatment centers of [place] for second opinion and delayed therapy   01/26/2011 Surgery   right mastectomy followed by reconstruction: 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 But she could not afford these trips.   01/08/2014 Breast MRI   right breast multiple enhancing masses within  the soft tissues largest 5.6 cm in wall skin surface and the capsule of the silicone prosthesis, contiguous nodules involving in inferomedial breast and tired and subcentimeter nodules across the midline   08/29/2015 Surgery   Right mastectomy: IDC with invol of skin and skin  ulceration, breast capsule inv cancer, superior medial margin positive, 1/1 LN positive, grade 3, 14.3 cm, 3.5 cm, ALI, chest wall involv, ER 90-100%, PR 80-90%, HER-2 negative, Ki 67 40-50% T4CN1 (St 3B)   03/05/2016 -  Anti-estrogen oral therapy   Tamoxifen 20 mg daily stopped due to headache and uncontrolled hypertension. Decrease to 10 mg daily 04/03/2016, stopped June 2017 and took an estrogen metabolizer over-the-counter; started Aromasin April 2018 from a [place] physician, Zoladex started on 05/2017 and switched to Letrozole in 09/2017   01/04/2020 Surgery   Patient presented to the ED on 01/04/20 for worsening lower extremity numbness and underwent a decompressive laminectomy with tumor resection at T9 that was complicated with acute blood loss anemia and leukocytosis.   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  MEDICATIONS:  Current Outpatient Medications  Medication Sig Dispense Refill   acetaminophen (TYLENOL) 325 MG tablet Take 2 tablets (650 mg total) by mouth every 4 (four) hours as needed for mild pain ((score 1 to 3) or temp > 100.5). 30 tablet 0   amLODipine (NORVASC) 10 MG tablet Take 1 tablet (10 mg total) by mouth daily. 90 tablet 3   Ascorbic Acid (VITAMIN C PO) Take 1,000 mg by mouth daily.     bisacodyl (DULCOLAX) 10 MG suppository Place 1 suppository (10 mg total) rectally daily as needed for moderate constipation. 12 suppository 0   Calcium Carb-Cholecalciferol (OYSTER SHELL CALCIUM W/D) 500-200 MG-UNIT TABS TAKE 1 TABLET  BY MOUTH TWICE A DAY WITH MEALS (Patient taking differently: Take 1 tablet by mouth 2 (two) times daily with a meal.) 180 tablet 1   Cholecalciferol (VITAMIN D3) 125 MCG (5000 UT) CAPS TAKE 1 CAPSULE (5,000 UNITS TOTAL) BY MOUTH 2 (TWO) TIMES DAILY. (Patient taking differently: Take 5,000 Units by mouth in the morning and at bedtime.) 180 capsule 3   dexamethasone (DECADRON) 1 MG tablet Take 1 mg by mouth daily as needed (when appetite  is low).     diazepam (VALIUM) 5 MG tablet Take 1 tablet (5 mg total) by mouth every 12 (twelve) hours as needed for muscle spasms. 60 tablet 3   Flaxseed Oil OIL Take 5 mLs by mouth in the morning, at noon, and at bedtime.      goserelin (ZOLADEX) 3.6 MG injection Inject 3.6 mg into the skin every 28 (twenty-eight) days.     letrozole (FEMARA) 2.5 MG tablet TAKE 1 TABLET BY MOUTH EVERY DAY (Patient taking differently: Take 2.5 mg by mouth daily.) 90 tablet 1   lidocaine-prilocaine (EMLA) cream APPLY TOPICALLY AS NEEDED. (Patient taking differently: Apply 1 application topically See admin instructions. Use with zoladex injection) 30 g 1   ondansetron (ZOFRAN ODT) 4 MG disintegrating tablet Take 1 tablet (4 mg total) by  mouth every 8 (eight) hours as needed for nausea or vomiting. 20 tablet 0   Oxycodone HCl 10 MG TABS Take 1 tablet (10 mg total) by mouth every 3 (three) hours as needed ((score 7 to 10)). 180 tablet 0   palbociclib (IBRANCE) 125 MG tablet TAKE 1 TABLET (125 MG TOTAL) BY MOUTH DAILY. TAKE FOR 21 DAYS ON, 7 DAYS OFF, REPEAT EVERY 28 DAYS. (Patient taking differently: Take 125 mg by mouth See admin instructions. Take for 21 days, then off for 7 days, repeat every 28 days) 21 tablet 2   polyethylene glycol (MIRALAX / GLYCOLAX) 17 g packet Take 17 g by mouth daily as needed for mild constipation. 14 each 0   No current facility-administered medications for this visit.    VITAL SIGNS: There were no vitals taken for this visit. There were no vitals filed  for this visit.  Estimated body mass index is 22.01 kg/m as calculated from the following:   Height as of 08/25/21: 5' 5"  (1.651 m).   Weight as of 08/25/21: 132 lb 4.4 oz (60 kg).  LABS: CBC:    Component Value Date/Time   WBC 2.0 (L) 09/04/2021 0104   HGB 9.6 (L) 09/04/2021 0104   HGB 11.7 (L) 06/22/2021 1514   HGB 13.1 11/01/2017 1417   HCT 28.6 (L) 09/04/2021 0104   HCT 40.1 11/01/2017 1417   PLT 426 (H) 09/04/2021 0104   PLT 358 06/22/2021 1514   PLT 227 11/01/2017 1417   MCV 97.9 09/04/2021 0104   MCV 89.3 11/01/2017 1417   NEUTROABS 1.1 (L) 09/04/2021 0104   NEUTROABS 4.1 11/01/2017 1417   LYMPHSABS 0.7 09/04/2021 0104   LYMPHSABS 2.5 11/01/2017 1417   MONOABS 0.1 09/04/2021 0104   MONOABS 0.7 11/01/2017 1417   EOSABS 0.0 09/04/2021 0104   EOSABS 0.1 11/01/2017 1417   BASOSABS 0.0 09/04/2021 0104   BASOSABS 0.0 11/01/2017 1417   Comprehensive Metabolic Panel:    Component Value Date/Time   NA 135 09/04/2021 0104   NA 141 11/01/2017 1417   K 4.4 09/04/2021 0104   K 3.5 11/01/2017 1417   CL 103 09/04/2021 0104   CO2 28 09/04/2021 0104   CO2 26 11/01/2017 1417   BUN 14 09/04/2021 0104   BUN 15.3 11/01/2017 1417   CREATININE 0.77 09/04/2021 0104   CREATININE 1.19 (H) 06/22/2021 1514   CREATININE 0.8 11/01/2017 1417   GLUCOSE 86 09/04/2021 0104   GLUCOSE 81 11/01/2017 1417   CALCIUM 9.3 09/04/2021 0104   CALCIUM 8.9 11/01/2017 1417   AST 28 06/22/2021 1514   AST 12 11/01/2017 1417   ALT 16 06/22/2021 1514   ALT 14 11/01/2017 1417   ALKPHOS 99 06/22/2021 1514   ALKPHOS 121 11/01/2017 1417   BILITOT 0.4 06/22/2021 1514   BILITOT 0.23 11/01/2017 1417   PROT 7.4 06/22/2021 1514   PROT 6.5 11/01/2017 1417   ALBUMIN 3.9 06/22/2021 1514   ALBUMIN 3.6 11/01/2017 1417    RADIOGRAPHIC STUDIES: No results found.  PERFORMANCE STATUS (ECOG) : 2 - Symptomatic, <50% confined to bed  Review of Systems  Constitutional:  Positive for fatigue.  Musculoskeletal:   Positive for arthralgias.  Skin:  Positive for wound.  Neurological:  Positive for weakness.  Psychiatric/Behavioral:         Anxiety   Unless otherwise noted, a complete review of systems is negative.   IMPRESSION:  Ms. Stubbe was seen by our palliative team during her recent hospitalization to assist with symptom  management and goals of care.  Today is my initial encounter with patient for a postdischarge follow-up.  Didi is divorced and has a boyfriend.  She is originally from [place].  She has no children however has a very supportive family which includes her mother, grandmother and older brother.  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  Patient shares she is ambulatory in the home with a cane at times.  No recent falls.  She does still actively drive and able to perform most ADLs independently with frequent rest breaks due to weakness and fatigue.  States her appetite is fair and generally fluctuates based on her severity of pain and weakness.  Detailed discussion was had regarding patient's current pain regimen.  Patient is appreciative of current regimen with oxycodone as this is controlling her pain as best possible.  She does endorse anxiety and occasional restlessness due to pain.  Education provided on the use of ways to cope with anxiety. She also has Valium in the home which she does not use frequently. Patient does endorse occasional constipation. Education provided on the use of Miralax daily to prevent constipation in the setting of cancer and opioid use.   We discussed patient's overall appetite and ways to increase nutrition including small frequent meals, increase protein intake, peanut butter, and protein shakes. She verbalized understanding and appreciation.   All questions answered and support provided.   PLAN: Continue with Oxycodone as needed.  Oxycontin  20 mg twice daily for long-acting pain Small frequent meals during the day. Discussed increase protein (shakes, powder, peanut butter etc.).  Valium as needed for muscle spasms/anxiety.  Miralax daily for bowel regimen  I will plan to see patient back in collaboration with oncology appointments on 09/26/2021.    Patient expressed understanding and was in agreement with this plan. She also understands that She can call the clinic at any time with any questions, concerns, or complaints.     Time Total: 40 min.   Visit consisted of counseling and education dealing with the complex and emotionally intense issues of symptom management and palliative care in the setting of serious and potentially life-threatening illness.Greater than 50%  of this time was spent counseling and coordinating care related to the above assessment and plan.  Signed by: [name], AGPCNP-BC Palliative Medicine Team

## 2021-09-13 ENCOUNTER — Encounter: Payer: Self-pay | Admitting: Hematology and Oncology

## 2021-09-13 ENCOUNTER — Other Ambulatory Visit: Payer: Self-pay

## 2021-09-13 ENCOUNTER — Telehealth: Payer: Medicare PPO | Admitting: Internal Medicine

## 2021-09-18 DIAGNOSIS — S21109A Unspecified open wound of unspecified front wall of thorax without penetration into thoracic cavity, initial encounter: Secondary | ICD-10-CM | POA: Diagnosis not present

## 2021-09-18 DIAGNOSIS — C7949 Secondary malignant neoplasm of other parts of nervous system: Secondary | ICD-10-CM | POA: Diagnosis not present

## 2021-09-19 ENCOUNTER — Other Ambulatory Visit (HOSPITAL_COMMUNITY): Payer: Self-pay

## 2021-09-21 ENCOUNTER — Other Ambulatory Visit (HOSPITAL_COMMUNITY): Payer: Self-pay

## 2021-09-22 ENCOUNTER — Ambulatory Visit: Payer: Medicare PPO

## 2021-09-25 ENCOUNTER — Ambulatory Visit: Payer: Medicare PPO

## 2021-09-25 ENCOUNTER — Ambulatory Visit: Payer: Medicare PPO | Admitting: Hematology and Oncology

## 2021-09-25 ENCOUNTER — Other Ambulatory Visit: Payer: Medicare PPO

## 2021-09-26 ENCOUNTER — Inpatient Hospital Stay: Payer: Medicare PPO | Admitting: Nurse Practitioner

## 2021-09-26 ENCOUNTER — Inpatient Hospital Stay: Payer: Medicare PPO

## 2021-09-26 ENCOUNTER — Inpatient Hospital Stay: Payer: Medicare PPO | Admitting: Hematology and Oncology

## 2021-09-26 DIAGNOSIS — C7949 Secondary malignant neoplasm of other parts of nervous system: Secondary | ICD-10-CM | POA: Diagnosis not present

## 2021-09-26 DIAGNOSIS — S21109A Unspecified open wound of unspecified front wall of thorax without penetration into thoracic cavity, initial encounter: Secondary | ICD-10-CM | POA: Diagnosis not present

## 2021-10-02 ENCOUNTER — Other Ambulatory Visit: Payer: Self-pay | Admitting: *Deleted

## 2021-10-02 DIAGNOSIS — C50411 Malignant neoplasm of upper-outer quadrant of right female breast: Secondary | ICD-10-CM

## 2021-10-02 NOTE — Progress Notes (Signed)
Patient Care Team: Nicholas Lose, MD as PCP - General (Hematology and Oncology)  DIAGNOSIS:    ICD-10-CM   1. Malignant neoplasm of upper-outer quadrant of right breast in female, estrogen receptor positive ([place])  C50.411    Z17.0       SUMMARY OF ONCOLOGIC HISTORY: Oncology History  Breast cancer of upper-outer quadrant of right female breast ([place])  09/21/2010 Mammogram   right breast linear and segmental pleomorphic calcifications from 4:00 to 6:00 position ultrasound revealed 1.6 cm and 1.1 cm masses   09/27/2010 Initial Biopsy   ultrasound-guided biopsy of all masses showed DCIS grade 2; patient went to cancer treatment centers of [place] for second opinion and delayed therapy   01/26/2011 Surgery   right mastectomy followed by reconstruction: 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 But she could not afford these trips.   01/08/2014 Breast MRI   right breast multiple enhancing masses within the soft tissues largest 5.6 cm in wall skin surface and the capsule of the silicone prosthesis, contiguous nodules involving in inferomedial breast and tired and subcentimeter nodules across the midline   08/29/2015 Surgery   Right mastectomy: IDC with invol of skin and skin ulceration, breast capsule inv cancer, superior medial margin positive, 1/1 LN positive, grade 3, 14.3 cm, 3.5 cm, ALI, chest wall involv, ER 90-100%, PR 80-90%, HER-2 negative, Ki 67 40-50% T4CN1 (St 3B)   03/05/2016 -  Anti-estrogen oral therapy   Tamoxifen 20 mg daily stopped due to headache  and uncontrolled hypertension. Decrease to 10 mg daily 04/03/2016, stopped June 2017 and took an estrogen metabolizer over-the-counter; started Aromasin April 2018 from a [place] physician, Zoladex started on 05/2017 and switched to Letrozole in 09/2017   01/04/2020 Surgery   Patient presented to the ED on 01/04/20 for worsening lower extremity numbness and underwent a decompressive laminectomy with tumor resection at T9 that was complicated with acute blood loss anemia and leukocytosis.   01/28/2020 - 02/15/2020 Radiation Therapy   Palliative radiation at site of thoracic tumor resection   03/03/2020 Miscellaneous    Ibrance with letrozole and Zoladex    Miscellaneous   Guardant 360: ESR 1 mutations, PI K3 CA mutation, EGFR mutation, T p53 mutation   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[place])  01/04/2020 Initial Diagnosis   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)     CHIEF COMPLIANT: Follow-up of metastatic breast cancer  INTERVAL HISTORY: Misty Arnold is a 50 y.o. with above-mentioned history of metastatic breast cancer who was progressing on hormone therapy treatments and was recommended Enhertu but she declined. She presents to the clinic today for follow-up.  She is complaining of worsening discomfort in the right breast and chest wall.  She reports that the tissue is starting to become sticky and she is concerned that it is getting infected.  She has been managing her own wound care all along.  She is currently off Ibrance because she did not answer some phone calls from pharmacy.  She finally got Ibrance and she is starting to take them now.  ALLERGIES:  is allergic to amoxicillin,  penicillins, pork-derived products, penicillin g, and tamoxifen.  MEDICATIONS:  Current Outpatient Medications  Medication Sig Dispense Refill   acetaminophen (TYLENOL) 325 MG tablet Take 2 tablets (650 mg total) by mouth every 4 (four) hours as needed for mild pain ((score 1 to 3) or temp > 100.5). 30  tablet 0   amLODipine (NORVASC) 10 MG tablet Take 1 tablet (10 mg total) by mouth daily. 90 tablet 3   Ascorbic Acid (VITAMIN C PO) Take 1,000 mg by mouth daily.     bisacodyl (DULCOLAX) 10 MG suppository Place 1 suppository (10 mg total) rectally daily as needed for moderate constipation. 12 suppository 0   Calcium Carb-Cholecalciferol (OYSTER SHELL CALCIUM W/D) 500-200 MG-UNIT TABS TAKE 1 TABLET BY MOUTH TWICE A DAY WITH MEALS (Patient taking differently: Take 1 tablet by mouth 2 (two) times daily with a meal.) 180 tablet 1   Cholecalciferol (VITAMIN D3) 125 MCG (5000 UT) CAPS TAKE 1 CAPSULE (5,000 UNITS TOTAL) BY MOUTH 2 (TWO) TIMES DAILY. (Patient taking differently: Take 5,000 Units by mouth in the morning and at bedtime.) 180 capsule 3   dexamethasone (DECADRON) 1 MG tablet Take 1 mg by mouth daily as needed (when appetite  is low).     diazepam (VALIUM) 5 MG tablet Take 1 tablet (5 mg total) by mouth every 12 (twelve) hours as needed for muscle spasms. 60 tablet 3   Flaxseed Oil OIL Take 5 mLs by mouth in the morning, at noon, and at bedtime.      goserelin (ZOLADEX) 3.6 MG injection Inject 3.6 mg into the skin every 28 (twenty-eight) days.     letrozole (FEMARA) 2.5 MG tablet TAKE 1 TABLET BY MOUTH EVERY DAY (Patient taking differently: Take 2.5 mg by mouth daily.) 90 tablet 1   lidocaine-prilocaine (EMLA) cream APPLY TOPICALLY AS NEEDED. (Patient taking differently: Apply 1 application topically See admin instructions. Use with zoladex injection) 30 g 1   ondansetron (ZOFRAN ODT) 4 MG disintegrating tablet Take 1 tablet (4 mg total) by mouth every 8 (eight) hours as needed for nausea or vomiting. 20 tablet 0   Oxycodone HCl 10 MG TABS Take 1 tablet (10 mg total) by mouth every 3 (three) hours as needed ((score 7 to 10)). 180 tablet 0   palbociclib (IBRANCE) 125 MG tablet TAKE 1 TABLET (125 MG TOTAL) BY MOUTH DAILY. TAKE FOR 21 DAYS ON, 7 DAYS OFF, REPEAT EVERY 28 DAYS. (Patient taking  differently: Take 125 mg by mouth See admin instructions. Take for 21 days, then off for 7 days, repeat every 28 days) 21 tablet 2   polyethylene glycol (MIRALAX / GLYCOLAX) 17 g packet Take 17 g by mouth daily as needed for mild constipation. 14 each 0   No current facility-administered medications for this visit.    PHYSICAL EXAMINATION: ECOG PERFORMANCE STATUS: 1 - Symptomatic but completely ambulatory  Vitals:   10/03/21 1423  BP: 136/74  Pulse: (!) 126  Resp: 19  Temp: 98.5 F (36.9 C)  SpO2: 99%   Filed Weights   10/03/21 1423  Weight: 120 lb 4.8 oz (54.6 kg)      LABORATORY DATA:  I have reviewed the data as listed CMP Latest Ref Rng & Units 09/04/2021 09/01/2021 08/31/2021  Glucose 70 - 99 mg/dL 86 111(H) 125(H)  BUN 6 - 20 mg/dL _0 Creatinine 0.44 - 1.00 mg/dL 0.77 0.66 0.77  Sodium 135 - 145 mmol/L 135 135 138  Potassium 3.5 - 5.1 mmol/L 4.4 3.9  3.4(L)  Chloride 98 - 111 mmol/L 103 105 105  CO2 22 - 32 mmol/L _0 Calcium 8.9 - 10.3 mg/dL 9.3 9.0 8.7(L)  Total Protein 6.5 - 8.1 g/dL - - -  Total Bilirubin 0.3 - 1.2 mg/dL - - -  Alkaline Phos 38 - 126 U/L - - -  AST 15 - 41 U/L - - -  ALT 0 - 44 U/L - - -    Lab Results  Component Value Date   WBC 7.9 10/03/2021   HGB 11.1 (L) 10/03/2021   HCT 33.2 (L) 10/03/2021   MCV 90.7 10/03/2021   PLT 476 (H) 10/03/2021   NEUTROABS 6.2 10/03/2021    ASSESSMENT & PLAN:  Breast cancer of upper-outer quadrant of right female breast ([place]) Recurrent right breast cancer initially DCIS treated with mastectomy followed by reconstruction and later developed chest wall recurrence in 2013 ER/PR positive HER-2 negative, patient underwent alternative therapies in [place] but could not afford the trips and hence she has not been on any breast cancer therapy for a long time.   Treatment summary: 1. Right mastectomy 08/29/2015 at [facility] (Dr.Singh): IDC with invol of skin and skin ulceration, breast capsule inv  cancer, superior medial margin positive, 1/1 LN positive, grade 3, 14.3 cm, 3.5 cm, ALI, chest wall involv, ER 90-100%, PR 80-90%, HER-2 negative, Ki 67 40-50% T4CN1 (St 3B) 2. Patient started tamoxifen but developed severe headache with severe hypertension and tamoxifen was discontinued 03/17/2016.  3. 02/2017 started on Exemestane, Zoladex started 05/29/2017 4.  Hospitalization for paraplegia: T9 vertebral body compression status post decompressive laminectomy and tumor resection 01/04/2020 5.  Palliative radiation to the thoracic spine 01/28/2020 6.  Ibrance with letrozole started April 2021 discontinued 07/24/2021 7.  Enhertu started 07/31/2021 every 3 weeks  ----------------------------------------------------------------------------------------------------------------- 05/11/2021: CT angiogram: Metastatic breast cancer soft tissue breast masses greater on the right, axillary supraclavicular lymphadenopathy, multiple bone metastases    Hospitalization 08/25/2021-09/05/2021: Worsening right chest wall pain and infection (patient has appointments with palliative care as well as infectious disease)   Treatment plan: Patient decided not to initiate Enhertu at this time.  She will continue on Zoladex injections for ovarian function suppression along with letrozole and Ibrance.   Wound infection: It appears that she is getting recurrent wound infections.  It is for this reason I am recommending that she get transferred to the emergency room and then get admitted for worsening chest wall wound infection. Palliative care is seeing her today. She is also getting injections Zoladex today.   No orders of the defined types were placed in this encounter.  The patient has a good understanding of the overall plan. she agrees with it. she will call with any problems that may develop before the next visit here.  Total time spent: 20 mins including face to face time and time spent for planning, charting and  coordination of care  Rulon Eisenmenger, MD, MPH 10/03/2021  I, Thana Ates, am acting as scribe for Dr. Nicholas Lose.  I have reviewed the above documentation for accuracy and completeness, and I agree with the above.

## 2021-10-03 ENCOUNTER — Inpatient Hospital Stay: Payer: Medicare PPO

## 2021-10-03 ENCOUNTER — Inpatient Hospital Stay (HOSPITAL_COMMUNITY)
Admission: AD | Admit: 2021-10-03 | Discharge: 2021-10-16 | DRG: 872 | Disposition: A | Discharge status: Home / Self Care | Payer: Medicare PPO | Source: Ambulatory Visit | Attending: Internal Medicine | Admitting: Internal Medicine

## 2021-10-03 ENCOUNTER — Inpatient Hospital Stay (HOSPITAL_BASED_OUTPATIENT_CLINIC_OR_DEPARTMENT_OTHER): Payer: Medicare PPO | Admitting: Hematology and Oncology

## 2021-10-03 ENCOUNTER — Inpatient Hospital Stay (HOSPITAL_BASED_OUTPATIENT_CLINIC_OR_DEPARTMENT_OTHER): Payer: Medicare PPO | Admitting: Nurse Practitioner

## 2021-10-03 ENCOUNTER — Encounter (HOSPITAL_COMMUNITY): Payer: Self-pay | Admitting: Internal Medicine

## 2021-10-03 ENCOUNTER — Other Ambulatory Visit: Payer: Self-pay

## 2021-10-03 ENCOUNTER — Other Ambulatory Visit: Payer: Self-pay | Admitting: *Deleted

## 2021-10-03 VITALS — BP 131/88 | HR 122 | Temp 100.3°F | Resp 21

## 2021-10-03 DIAGNOSIS — E876 Hypokalemia: Secondary | ICD-10-CM | POA: Diagnosis not present

## 2021-10-03 DIAGNOSIS — Z170 Estrogen receptor positive status [ER+]: Secondary | ICD-10-CM

## 2021-10-03 DIAGNOSIS — Z8249 Family history of ischemic heart disease and other diseases of the circulatory system: Secondary | ICD-10-CM | POA: Diagnosis not present

## 2021-10-03 DIAGNOSIS — R599 Enlarged lymph nodes, unspecified: Secondary | ICD-10-CM | POA: Diagnosis not present

## 2021-10-03 DIAGNOSIS — R530 Neoplastic (malignant) related fatigue: Secondary | ICD-10-CM

## 2021-10-03 DIAGNOSIS — C50411 Malignant neoplasm of upper-outer quadrant of right female breast: Secondary | ICD-10-CM

## 2021-10-03 DIAGNOSIS — I96 Gangrene, not elsewhere classified: Secondary | ICD-10-CM | POA: Diagnosis not present

## 2021-10-03 DIAGNOSIS — C801 Malignant (primary) neoplasm, unspecified: Secondary | ICD-10-CM | POA: Diagnosis present

## 2021-10-03 DIAGNOSIS — G96198 Other disorders of meninges, not elsewhere classified: Secondary | ICD-10-CM | POA: Diagnosis present

## 2021-10-03 DIAGNOSIS — G893 Neoplasm related pain (acute) (chronic): Secondary | ICD-10-CM | POA: Diagnosis not present

## 2021-10-03 DIAGNOSIS — Z853 Personal history of malignant neoplasm of breast: Secondary | ICD-10-CM

## 2021-10-03 DIAGNOSIS — J910 Malignant pleural effusion: Secondary | ICD-10-CM | POA: Diagnosis present

## 2021-10-03 DIAGNOSIS — L0889 Other specified local infections of the skin and subcutaneous tissue: Secondary | ICD-10-CM | POA: Diagnosis present

## 2021-10-03 DIAGNOSIS — E860 Dehydration: Secondary | ICD-10-CM | POA: Diagnosis present

## 2021-10-03 DIAGNOSIS — Z833 Family history of diabetes mellitus: Secondary | ICD-10-CM

## 2021-10-03 DIAGNOSIS — Z79899 Other long term (current) drug therapy: Secondary | ICD-10-CM

## 2021-10-03 DIAGNOSIS — I10 Essential (primary) hypertension: Secondary | ICD-10-CM | POA: Diagnosis present

## 2021-10-03 DIAGNOSIS — R29898 Other symptoms and signs involving the musculoskeletal system: Secondary | ICD-10-CM

## 2021-10-03 DIAGNOSIS — C779 Secondary and unspecified malignant neoplasm of lymph node, unspecified: Secondary | ICD-10-CM | POA: Diagnosis present

## 2021-10-03 DIAGNOSIS — Z91014 Allergy to mammalian meats: Secondary | ICD-10-CM

## 2021-10-03 DIAGNOSIS — E440 Moderate protein-calorie malnutrition: Secondary | ICD-10-CM | POA: Diagnosis present

## 2021-10-03 DIAGNOSIS — J90 Pleural effusion, not elsewhere classified: Secondary | ICD-10-CM | POA: Diagnosis not present

## 2021-10-03 DIAGNOSIS — M869 Osteomyelitis, unspecified: Secondary | ICD-10-CM | POA: Diagnosis present

## 2021-10-03 DIAGNOSIS — C7951 Secondary malignant neoplasm of bone: Secondary | ICD-10-CM | POA: Insufficient documentation

## 2021-10-03 DIAGNOSIS — C7989 Secondary malignant neoplasm of other specified sites: Secondary | ICD-10-CM | POA: Diagnosis present

## 2021-10-03 DIAGNOSIS — Z888 Allergy status to other drugs, medicaments and biological substances status: Secondary | ICD-10-CM

## 2021-10-03 DIAGNOSIS — R0789 Other chest pain: Secondary | ICD-10-CM

## 2021-10-03 DIAGNOSIS — Z880 Allergy status to penicillin: Secondary | ICD-10-CM

## 2021-10-03 DIAGNOSIS — C7949 Secondary malignant neoplasm of other parts of nervous system: Secondary | ICD-10-CM | POA: Diagnosis not present

## 2021-10-03 DIAGNOSIS — Z7189 Other specified counseling: Secondary | ICD-10-CM | POA: Diagnosis not present

## 2021-10-03 DIAGNOSIS — G9589 Other specified diseases of spinal cord: Secondary | ICD-10-CM | POA: Diagnosis present

## 2021-10-03 DIAGNOSIS — J9811 Atelectasis: Secondary | ICD-10-CM | POA: Diagnosis not present

## 2021-10-03 DIAGNOSIS — G959 Disease of spinal cord, unspecified: Secondary | ICD-10-CM | POA: Diagnosis present

## 2021-10-03 DIAGNOSIS — Z803 Family history of malignant neoplasm of breast: Secondary | ICD-10-CM

## 2021-10-03 DIAGNOSIS — Z6820 Body mass index (BMI) 20.0-20.9, adult: Secondary | ICD-10-CM

## 2021-10-03 DIAGNOSIS — Z9011 Acquired absence of right breast and nipple: Secondary | ICD-10-CM | POA: Insufficient documentation

## 2021-10-03 DIAGNOSIS — Z83438 Family history of other disorder of lipoprotein metabolism and other lipidemia: Secondary | ICD-10-CM

## 2021-10-03 DIAGNOSIS — Z8349 Family history of other endocrine, nutritional and metabolic diseases: Secondary | ICD-10-CM

## 2021-10-03 DIAGNOSIS — G8222 Paraplegia, incomplete: Secondary | ICD-10-CM | POA: Diagnosis not present

## 2021-10-03 DIAGNOSIS — R5081 Fever presenting with conditions classified elsewhere: Secondary | ICD-10-CM | POA: Diagnosis not present

## 2021-10-03 DIAGNOSIS — Z823 Family history of stroke: Secondary | ICD-10-CM

## 2021-10-03 DIAGNOSIS — Z515 Encounter for palliative care: Secondary | ICD-10-CM

## 2021-10-03 DIAGNOSIS — Z800 Family history of malignant neoplasm of digestive organs: Secondary | ICD-10-CM

## 2021-10-03 DIAGNOSIS — L089 Local infection of the skin and subcutaneous tissue, unspecified: Secondary | ICD-10-CM | POA: Diagnosis present

## 2021-10-03 DIAGNOSIS — C50919 Malignant neoplasm of unspecified site of unspecified female breast: Secondary | ICD-10-CM | POA: Diagnosis not present

## 2021-10-03 DIAGNOSIS — T148XXA Other injury of unspecified body region, initial encounter: Secondary | ICD-10-CM | POA: Diagnosis present

## 2021-10-03 DIAGNOSIS — R531 Weakness: Secondary | ICD-10-CM | POA: Diagnosis not present

## 2021-10-03 DIAGNOSIS — A419 Sepsis, unspecified organism: Principal | ICD-10-CM | POA: Diagnosis present

## 2021-10-03 DIAGNOSIS — C773 Secondary and unspecified malignant neoplasm of axilla and upper limb lymph nodes: Secondary | ICD-10-CM | POA: Diagnosis not present

## 2021-10-03 DIAGNOSIS — Z86718 Personal history of other venous thrombosis and embolism: Secondary | ICD-10-CM

## 2021-10-03 DIAGNOSIS — Z90710 Acquired absence of both cervix and uterus: Secondary | ICD-10-CM

## 2021-10-03 LAB — CBC WITH DIFFERENTIAL (CANCER CENTER ONLY)
Abs Immature Granulocytes: 0.03 10*3/uL (ref 0.00–0.07)
Basophils Absolute: 0 10*3/uL (ref 0.0–0.1)
Basophils Relative: 1 %
Eosinophils Absolute: 0 10*3/uL (ref 0.0–0.5)
Eosinophils Relative: 0 %
HCT: 33.2 % — ABNORMAL LOW (ref 36.0–46.0)
Hemoglobin: 11.1 g/dL — ABNORMAL LOW (ref 12.0–15.0)
Immature Granulocytes: 0 %
Lymphocytes Relative: 12 %
Lymphs Abs: 1 10*3/uL (ref 0.7–4.0)
MCH: 30.3 pg (ref 26.0–34.0)
MCHC: 33.4 g/dL (ref 30.0–36.0)
MCV: 90.7 fL (ref 80.0–100.0)
Monocytes Absolute: 0.7 10*3/uL (ref 0.1–1.0)
Monocytes Relative: 8 %
Neutro Abs: 6.2 10*3/uL (ref 1.7–7.7)
Neutrophils Relative %: 79 %
Platelet Count: 476 10*3/uL — ABNORMAL HIGH (ref 150–400)
RBC: 3.66 MIL/uL — ABNORMAL LOW (ref 3.87–5.11)
RDW: 15.3 % (ref 11.5–15.5)
WBC Count: 7.9 10*3/uL (ref 4.0–10.5)
nRBC: 0 % (ref 0.0–0.2)

## 2021-10-03 LAB — CMP (CANCER CENTER ONLY)
ALT: 29 U/L (ref 0–44)
AST: 40 U/L (ref 15–41)
Albumin: 3.7 g/dL (ref 3.5–5.0)
Alkaline Phosphatase: 78 U/L (ref 38–126)
Anion gap: 8 (ref 5–15)
BUN: 13 mg/dL (ref 6–20)
CO2: 28 mmol/L (ref 22–32)
Calcium: 9.9 mg/dL (ref 8.9–10.3)
Chloride: 102 mmol/L (ref 98–111)
Creatinine: 0.84 mg/dL (ref 0.44–1.00)
GFR, Estimated: >60 mL/min (ref >60)
Glucose, Bld: 84 mg/dL (ref 70–99)
Potassium: 3.6 mmol/L (ref 3.5–5.1)
Sodium: 138 mmol/L (ref 135–145)
Total Bilirubin: 0.4 mg/dL (ref 0.3–1.2)
Total Protein: 8 g/dL (ref 6.5–8.1)

## 2021-10-03 MED ORDER — SENNOSIDES-DOCUSATE SODIUM 8.6-50 MG PO TABS
1.0000 | ORAL_TABLET | Freq: Every day | ORAL | Status: DC
  Administered 2021-10-04 – 2021-10-15 (×12): 1 via ORAL
  Filled 2021-10-03 (×12): qty 1

## 2021-10-03 MED ORDER — VANCOMYCIN HCL 1000 MG/200ML IV SOLN
1000.0000 mg | Freq: Once | INTRAVENOUS | Status: AC
  Administered 2021-10-04: 1000 mg via INTRAVENOUS

## 2021-10-03 MED ORDER — DIAZEPAM 5 MG PO TABS
5.0000 mg | ORAL_TABLET | Freq: Four times a day (QID) | ORAL | Status: DC | PRN
  Administered 2021-10-07 – 2021-10-15 (×5): 5 mg via ORAL
  Filled 2021-10-03 (×6): qty 1

## 2021-10-03 MED ORDER — PREGABALIN 50 MG PO CAPS
ORAL_CAPSULE | ORAL | Status: AC
  Filled 2021-10-03: qty 1

## 2021-10-03 MED ORDER — SODIUM CHLORIDE 0.9% FLUSH
3.0000 mL | Freq: Two times a day (BID) | INTRAVENOUS | Status: DC
  Administered 2021-10-03 – 2021-10-14 (×10): 3 mL via INTRAVENOUS

## 2021-10-03 MED ORDER — SENNOSIDES-DOCUSATE SODIUM 8.6-50 MG PO TABS
ORAL_TABLET | ORAL | Status: AC
  Administered 2021-10-03: 1 via ORAL
  Filled 2021-10-03: qty 1

## 2021-10-03 MED ORDER — SODIUM CHLORIDE 0.9 % IV SOLN: INTRAVENOUS | Status: DC

## 2021-10-03 MED ORDER — VANCOMYCIN HCL 1250 MG/250ML IV SOLN
1250.0000 mg | INTRAVENOUS | Status: DC
Start: 2021-10-04 — End: 2021-10-05
  Administered 2021-10-04: 1250 mg via INTRAVENOUS
  Filled 2021-10-03: qty 250

## 2021-10-03 MED ORDER — MORPHINE SULFATE (PF) 4 MG/ML IV SOLN
INTRAVENOUS | Status: AC
  Filled 2021-10-03: qty 1

## 2021-10-03 MED ORDER — OXYCODONE HCL ER 15 MG PO T12A: 15.0000 mg | EXTENDED_RELEASE_TABLET | Freq: Two times a day (BID) | ORAL | 0 refills | Status: DC

## 2021-10-03 MED ORDER — METRONIDAZOLE 500 MG/100ML IV SOLN
500.0000 mg | Freq: Three times a day (TID) | INTRAVENOUS | Status: DC
  Administered 2021-10-03 – 2021-10-04 (×2): 500 mg via INTRAVENOUS
  Filled 2021-10-03: qty 100

## 2021-10-03 MED ORDER — PREGABALIN 50 MG PO CAPS
50.0000 mg | ORAL_CAPSULE | Freq: Every day | ORAL | Status: DC
  Administered 2021-10-03 – 2021-10-15 (×13): 50 mg via ORAL
  Filled 2021-10-03 (×12): qty 1

## 2021-10-03 MED ORDER — MORPHINE SULFATE (PF) 2 MG/ML IV SOLN
INTRAVENOUS | Status: AC
  Filled 2021-10-03: qty 1

## 2021-10-03 MED ORDER — MORPHINE SULFATE (PF) 4 MG/ML IV SOLN
4.0000 mg | INTRAVENOUS | Status: DC | PRN
  Administered 2021-10-03 – 2021-10-16 (×57): 4 mg via INTRAVENOUS
  Filled 2021-10-03 (×56): qty 1

## 2021-10-03 MED ORDER — THIAMINE HCL 100 MG/ML IJ SOLN
100.0000 mg | Freq: Every day | INTRAMUSCULAR | Status: DC
  Administered 2021-10-04 – 2021-10-06 (×3): 100 mg via INTRAVENOUS
  Filled 2021-10-03 (×2): qty 2

## 2021-10-03 MED ORDER — SODIUM CHLORIDE 0.9 % IV SOLN
2.0000 g | Freq: Three times a day (TID) | INTRAVENOUS | Status: DC
  Administered 2021-10-03 – 2021-10-11 (×23): 2 g via INTRAVENOUS
  Filled 2021-10-03 (×24): qty 2

## 2021-10-03 MED ORDER — GOSERELIN ACETATE 3.6 MG ~~LOC~~ IMPL
3.6000 mg | DRUG_IMPLANT | Freq: Once | SUBCUTANEOUS | Status: AC
Start: 2021-10-03 — End: 2021-10-03
  Administered 2021-10-03: 3.6 mg via SUBCUTANEOUS

## 2021-10-03 MED ORDER — OXYCODONE HCL ER 15 MG PO T12A
EXTENDED_RELEASE_TABLET | ORAL | Status: AC
  Administered 2021-10-03: 15 mg via ORAL
  Filled 2021-10-03: qty 1

## 2021-10-03 MED ORDER — OXYCODONE HCL ER 15 MG PO T12A
15.0000 mg | EXTENDED_RELEASE_TABLET | Freq: Two times a day (BID) | ORAL | Status: DC
  Administered 2021-10-04 – 2021-10-16 (×23): 15 mg via ORAL
  Filled 2021-10-03 (×24): qty 1

## 2021-10-03 MED ORDER — THIAMINE HCL 100 MG/ML IJ SOLN
INTRAMUSCULAR | Status: AC
  Administered 2021-10-03: 100 mg via INTRAVENOUS
  Filled 2021-10-03: qty 2

## 2021-10-03 NOTE — Progress Notes (Signed)
Patient arrived on the unit -her temp is 101 and she has sinus tachycardia. Will start Vancomycin, cefipime and flagyl for deep tissue infection related to malignant wound, blood cx X2 and obatin imaging to determine if there is deeper abscess or complications related to the wound.  Lane Hacker, DO Palliative Medicine

## 2021-10-03 NOTE — Progress Notes (Signed)
Per MD request RN placed Social Work referral.  Pt requesting list of community resources and possible help with finding an Assisted Living facility in the [place] area.

## 2021-10-03 NOTE — Progress Notes (Signed)
Pharmacy Antibiotic Note  Misty Arnold is a 50 y.o. female admitted on 10/03/2021 with recurrent wound infection.  Previous hospitalization from 08/25/21-09/05/21 for worsening R chest wall pain and infection.  PMH includes breast cancer (receiving Letrozole and Ibrance).  Pharmacy has been consulted for vancomycin and cefepime dosing.  Upon admission, patient is febrile (Tm 101) and tachycardic. MD ordering blood cultures and imaging to assess wound.  Plan: Cefepime 2g IV q8 hours Vancomycin 1000mg  IV x1, followed by Vancomycin 1250mg  IV q24 hours (eAUC 505, Scr 0.84) Monitor culture data, clinical improvement, vancomycin levels as indicated     Temp (24hrs), Avg:100.1 F (37.8 C), Min:98.5 F (36.9 C), Max:101.4 F (38.6 C)  Recent Labs  Lab 10/03/21 1407  WBC 7.9  CREATININE 0.84    Estimated Creatinine Clearance: 67.5 mL/min (by C-G formula based on SCr of 0.84 mg/dL).    Allergies  Allergen Reactions   Amoxicillin     Other reaction(s): rash/itching   Penicillins Rash    Did it involve swelling of the face/tongue/throat, SOB, or low BP? no Did it involve sudden or severe rash/hives, skin peeling, or any reaction on the inside of your mouth or nose? Yes Did you need to seek medical attention at a hospital or doctor's office? No When did it last happen? early 20's     If all above answers are "NO", may proceed with cephalosporin use.   Pork-Derived Products    Penicillin G    Tamoxifen Hypertension    Antimicrobials this admission: Vancomycin 11/29 >>  Cefepime 11/29 >>  Flagyl 11/29 >>  Dose adjustments this admission:   Microbiology results: 11/29 BCx:   Thank you for allowing pharmacy to be a part of this patient's care.  Dimple Nanas, PharmD 10/03/2021 7:52 PM

## 2021-10-03 NOTE — Progress Notes (Signed)
At this visit, we undressed Misty Arnold's wound dressing to assess her wound. It was red and white, oozing, and yellow crust around the edge. We redressed the wound with vaseline gauze and ABD pads. Secured dressing with breast binder. Misty Arnold stated she was having minimal pain at the time.

## 2021-10-03 NOTE — Progress Notes (Signed)
Misty Arnold  [phone] [phone]   Name: Misty Arnold Date: 10/03/2021 MRN: [id number]  DOB: 1971-07-16  Patient Care Team: Nicholas Lose, MD as PCP - General (Hematology and Oncology)    Misty Arnold is a 50 y.o. 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  Palliative ask to see for symptom management and goals of care.   SOCIAL HISTORY:     reports that she has never smoked. She has never used smokeless tobacco. She reports that she does not currently use alcohol. She reports that she does not use drugs.  ADVANCE DIRECTIVES:  Advance directives completed during recent hospitalization 08/27/2021.  Documents are on file and have been reviewed personally and [name].  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; patient went to cancer treatment centers of [place] for second opinion and delayed therapy   01/26/2011 Surgery   right mastectomy followed by reconstruction: Invasive ductal carcinoma T1 N1 MIC M0 stage IB ER/PR positive HER-2 negative, BRCA negative, Oncotype DX low risk   05/29/2012 Procedure   right chest wall nodule excision done on  06/24/2012 showed metastatic carcinoma margins were positive, but CT scan no metastatic disease, recommended chemotherapy but patient refused also refused reexcision   04/28/2013 Treatment Plan Change   patient went to [place] for alternative treatments and took herbal medications, tonics etc. But she could not afford these trips.   01/08/2014 Breast MRI   right breast multiple enhancing masses within the soft tissues largest 5.6 cm in wall skin surface and the capsule of the silicone prosthesis, contiguous nodules involving in inferomedial breast and tired and subcentimeter nodules across the midline   08/29/2015 Surgery   Right mastectomy: IDC with invol of skin and skin ulceration, breast capsule inv cancer, superior medial margin positive, 1/1 LN positive, grade 3, 14.3 cm, 3.5 cm, ALI, chest wall involv, ER 90-100%, PR 80-90%, HER-2 negative, Ki 67 40-50% T4CN1 (St 3B)   03/05/2016 -  Anti-estrogen oral therapy   Tamoxifen 20 mg daily stopped due to headache and uncontrolled hypertension. Decrease to 10 mg daily 04/03/2016, stopped June 2017 and took an estrogen metabolizer over-the-counter; started Aromasin April 2018 from a [place] physician, Zoladex started on 05/2017 and switched to Letrozole in 09/2017   01/04/2020 Surgery   Patient presented to the ED on 01/04/20 for worsening lower extremity numbness and underwent a decompressive laminectomy with tumor resection at T9 that was complicated with acute blood loss anemia and leukocytosis.   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  MEDICATIONS:  Current Outpatient Medications  Medication Sig Dispense Refill   oxyCODONE (OXYCONTIN) 15 mg 12 hr tablet Take 1 tablet (15 mg total) by mouth every 12 (twelve) hours. 60 tablet 0   acetaminophen (TYLENOL) 325 MG tablet Take 2 tablets (650 mg total) by mouth every 4 (four) hours as needed for mild pain ((score 1 to 3) or temp > 100.5). 30 tablet 0   amLODipine (NORVASC) 10 MG tablet Take 1 tablet (10 mg total) by mouth daily. 90 tablet 3   Ascorbic Acid (VITAMIN C PO) Take 1,000 mg by mouth daily.     bisacodyl (DULCOLAX) 10 MG suppository Place 1 suppository (10 mg total) rectally daily as needed for moderate constipation. 12 suppository 0   Calcium Carb-Cholecalciferol (OYSTER SHELL CALCIUM W/D) 500-200 MG-UNIT TABS TAKE 1 TABLET BY MOUTH TWICE A DAY WITH MEALS (Patient taking differently: Take 1 tablet by mouth 2 (two) times daily with a meal.) 180 tablet 1   Cholecalciferol (VITAMIN D3) 125 MCG (5000 UT) CAPS TAKE 1 CAPSULE (5,000 UNITS TOTAL) BY MOUTH 2 (TWO) TIMES DAILY. (Patient taking differently: Take 5,000 Units by mouth in the morning and at bedtime.) 180 capsule 3   dexamethasone (DECADRON) 1 MG tablet Take 1 mg by mouth daily as needed (when appetite  is low).     diazepam (VALIUM) 5 MG tablet Take 1 tablet (5 mg total) by mouth every 12 (twelve) hours as needed for muscle spasms. 60 tablet 3   Flaxseed Oil OIL Take 5 mLs by mouth in the morning, at noon, and at bedtime.      goserelin (ZOLADEX) 3.6 MG injection Inject 3.6 mg into the skin every 28 (twenty-eight) days.     letrozole (FEMARA) 2.5 MG tablet TAKE 1 TABLET BY MOUTH EVERY DAY (Patient taking differently: Take 2.5 mg by mouth daily.) 90 tablet 1   lidocaine-prilocaine (EMLA) cream APPLY TOPICALLY AS NEEDED. (Patient taking differently: Apply 1 application topically See admin instructions. Use with zoladex injection) 30 g 1   ondansetron (ZOFRAN ODT) 4 MG disintegrating  tablet Take 1 tablet (4 mg total) by mouth every 8 (eight) hours as needed for nausea or vomiting. 20 tablet 0   Oxycodone HCl 10 MG TABS Take 1 tablet (10 mg total) by mouth every 3 (three) hours as needed ((score 7 to 10)). 180 tablet 0   palbociclib (IBRANCE) 125 MG tablet TAKE 1 TABLET (125 MG TOTAL) BY MOUTH DAILY. TAKE FOR 21 DAYS ON, 7 DAYS OFF, REPEAT EVERY 28 DAYS. (Patient taking differently: Take 125 mg by mouth See admin instructions. Take for 21 days, then off for 7 days, repeat every 28 days) 21 tablet 2   polyethylene glycol (MIRALAX / GLYCOLAX) 17 g packet Take 17 g by mouth daily as needed for mild constipation. 14 each 0   No current facility-administered medications for this visit.    VITAL SIGNS: BP 131/88 (Patient Position: Sitting)   Pulse (!) 122 Comment: Manual; Nurse notified  Temp 100.3 F (37.9 C)   Resp (!) 21 Comment: Nurse notified  SpO2 100%  There were no vitals filed for this visit.  Estimated body mass index is 20.02 kg/m as calculated from the  following:   Height as of an earlier encounter on 10/03/21: 5' 5"  (1.651 m).   Weight as of an earlier encounter on 10/03/21: 120 lb 4.8 oz (54.6 kg).  LABS: CBC:    Component Value Date/Time   WBC 7.9 10/03/2021 1407   WBC 2.0 (L) 09/04/2021 0104   HGB 11.1 (L) 10/03/2021 1407   HGB 13.1 11/01/2017 1417   HCT 33.2 (L) 10/03/2021 1407   HCT 40.1 11/01/2017 1417   PLT 476 (H) 10/03/2021 1407   PLT 227 11/01/2017 1417   MCV 90.7 10/03/2021 1407   MCV 89.3 11/01/2017 1417   NEUTROABS 6.2 10/03/2021 1407   NEUTROABS 4.1 11/01/2017 1417   LYMPHSABS 1.0 10/03/2021 1407   LYMPHSABS 2.5 11/01/2017 1417   MONOABS 0.7 10/03/2021 1407   MONOABS 0.7 11/01/2017 1417   EOSABS 0.0 10/03/2021 1407   EOSABS 0.1 11/01/2017 1417   BASOSABS 0.0 10/03/2021 1407   BASOSABS 0.0 11/01/2017 1417   Comprehensive Metabolic Panel:    Component Value Date/Time   NA 138 10/03/2021 1407   NA 141 11/01/2017 1417   K 3.6  10/03/2021 1407   K 3.5 11/01/2017 1417   CL 102 10/03/2021 1407   CO2 28 10/03/2021 1407   CO2 26 11/01/2017 1417   BUN 13 10/03/2021 1407   BUN 15.3 11/01/2017 1417   CREATININE 0.84 10/03/2021 1407   CREATININE 0.8 11/01/2017 1417   GLUCOSE 84 10/03/2021 1407   GLUCOSE 81 11/01/2017 1417   CALCIUM 9.9 10/03/2021 1407   CALCIUM 8.9 11/01/2017 1417   AST 40 10/03/2021 1407   AST 12 11/01/2017 1417   ALT 29 10/03/2021 1407   ALT 14 11/01/2017 1417   ALKPHOS 78 10/03/2021 1407   ALKPHOS 121 11/01/2017 1417   BILITOT 0.4 10/03/2021 1407   BILITOT 0.23 11/01/2017 1417   PROT 8.0 10/03/2021 1407   PROT 6.5 11/01/2017 1417   ALBUMIN 3.7 10/03/2021 1407   ALBUMIN 3.6 11/01/2017 1417     PERFORMANCE STATUS (ECOG) : 2 - Symptomatic, <50% confined to bed   Physical Exam General: NAD, frail, ill-appearing  Cardiovascular: Tachycardic Pulmonary: diminished bilaterally Abdomen: soft, nontender, + bowel sounds Extremities: no edema, no joint deformities Skin: right sided fungating open chest/breast mass, oozing  Neurological: Weakness, AAO x3, mood appropriate   IMPRESSION: Patient presented to clinic today alone. Appears weak. Is complaining of severe right chest wall pain from fungating mass. Site assessed, cleansed, and dressing changed.   She reports poor appetite due to pain and discomfort. Generalized weakness. States she is requiring a significant amount of assistance with ADLs due to fatigue and weakness. She lives in the home with her elderly mother and patient's boyfriend. She is concerned about her ability to continue to care for herself. Emotional about her cancer and future prognosis. Emotional support provided.   We discussed Her current illness and what it means in the larger context of Her on-going co-morbidities. Natural disease trajectory and expectations were discussed.  I discussed the importance of continued conversation with family and their medical providers  regarding overall plan of care and treatment options, ensuring decisions are within the context of the patients values and GOCs.  PLAN: Unfortunately patient will need to be admitted in the hospital due to her uncontrolled pain, fungating mass, generalized weakness, and tachycardia. Patient will be directly admitted to [facility] for uncontrolled pain with plans to have Hospitalist assist with medical management.     Patient expressed understanding and was in agreement with  this plan. She also understands that She can call the clinic at any time with any questions, concerns, or complaints.     Time Total: 65 min   Visit consisted of counseling and education dealing with the complex and emotionally intense issues of symptom management and palliative care in the setting of serious and potentially life-threatening illness.Greater than 50%  of this time was spent counseling and coordinating care related to the above assessment and plan.  Signed by: [name], AGPCNP-BC Palliative Medicine Team

## 2021-10-03 NOTE — Assessment & Plan Note (Signed)
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  Treatment summary: 1.Right mastectomy 08/29/2015 at [facility] (Dr.Singh): IDC with invol of skin and skin ulceration, breast capsule inv cancer, superior medial margin positive, 1/1 LN positive, grade 3, 14.3 cm, 3.5 cm, ALI, chest wall involv, ER 90-100%, PR 80-90%, HER-2 negative, Ki 67 40-50% T4CN1 (St 3B) 2.Patient started tamoxifen but developed severe headache with severe hypertension and tamoxifen was discontinued 03/17/2016. 3.02/2017 started on Exemestane, Zoladex started 05/29/2017 4.Hospitalization for paraplegia: T9 vertebral body compression status post decompressive laminectomy and tumor resection 01/04/2020 5.Palliative radiation to the thoracic spine 01/28/2020 6.Ibrance with letrozole started April 2021discontinued 07/24/2021 7.Enhertu started 07/31/2021 every 3 weeks  ----------------------------------------------------------------------------------------------------------------- 05/11/2021: CT angiogram: Metastatic breast cancer soft tissue breast masses greater on the right, axillary supraclavicular lymphadenopathy, multiple bone metastases   Hospitalization 08/25/2021-09/05/2021: Worsening right chest wall pain and infection (patient has appointments with palliative care as well as infectious disease)  Treatment plan:Patient decided not to initiate Enhertu at this time.  She will continue on Zoladex injections for ovarian function suppression along with letrozole and Ibrance. She fully understands that this regimen does not appear to be controlling her disease but she does not want to change to any other regimen at this time.

## 2021-10-03 NOTE — Progress Notes (Signed)
Patient admitted directly from cancer center for infected chest wall wound and pain related to the fungating malignant mass. Will request transfer to hospitalist service tomorrow to assist with medical management. Plan for tonight is to start IV antibiotics and provide pain control.  Lane Hacker, DO Palliative Medicine

## 2021-10-04 DIAGNOSIS — Z515 Encounter for palliative care: Secondary | ICD-10-CM | POA: Diagnosis not present

## 2021-10-04 DIAGNOSIS — Z7189 Other specified counseling: Secondary | ICD-10-CM

## 2021-10-04 DIAGNOSIS — G893 Neoplasm related pain (acute) (chronic): Secondary | ICD-10-CM | POA: Diagnosis not present

## 2021-10-04 DIAGNOSIS — C7949 Secondary malignant neoplasm of other parts of nervous system: Secondary | ICD-10-CM

## 2021-10-04 DIAGNOSIS — C50411 Malignant neoplasm of upper-outer quadrant of right female breast: Secondary | ICD-10-CM | POA: Diagnosis not present

## 2021-10-04 LAB — CBC
HCT: 29.9 % — ABNORMAL LOW (ref 36.0–46.0)
Hemoglobin: 9.9 g/dL — ABNORMAL LOW (ref 12.0–15.0)
MCH: 30.5 pg (ref 26.0–34.0)
MCHC: 33.1 g/dL (ref 30.0–36.0)
MCV: 92 fL (ref 80.0–100.0)
Platelets: 381 10*3/uL (ref 150–400)
RBC: 3.25 MIL/uL — ABNORMAL LOW (ref 3.87–5.11)
RDW: 15.3 % (ref 11.5–15.5)
WBC: 6.3 10*3/uL (ref 4.0–10.5)
nRBC: 0 % (ref 0.0–0.2)

## 2021-10-04 LAB — COMPREHENSIVE METABOLIC PANEL
ALT: 21 U/L (ref 0–44)
AST: 30 U/L (ref 15–41)
Albumin: 2.9 g/dL — ABNORMAL LOW (ref 3.5–5.0)
Alkaline Phosphatase: 61 U/L (ref 38–126)
Anion gap: 9 (ref 5–15)
BUN: 10 mg/dL (ref 6–20)
CO2: 24 mmol/L (ref 22–32)
Calcium: 8.4 mg/dL — ABNORMAL LOW (ref 8.9–10.3)
Chloride: 106 mmol/L (ref 98–111)
Creatinine, Ser: 0.68 mg/dL (ref 0.44–1.00)
GFR, Estimated: >60 mL/min (ref >60)
Glucose, Bld: 107 mg/dL — ABNORMAL HIGH (ref 70–99)
Potassium: 3.5 mmol/L (ref 3.5–5.1)
Sodium: 139 mmol/L (ref 135–145)
Total Bilirubin: 0.4 mg/dL (ref 0.3–1.2)
Total Protein: 6.5 g/dL (ref 6.5–8.1)

## 2021-10-04 LAB — MAGNESIUM: Magnesium: 2 mg/dL (ref 1.7–2.4)

## 2021-10-04 MED ORDER — LIP MEDEX EX OINT
1.0000 "application " | TOPICAL_OINTMENT | CUTANEOUS | Status: DC | PRN
  Filled 2021-10-04: qty 7

## 2021-10-04 MED ORDER — METRONIDAZOLE 500 MG/100ML IV SOLN
500.0000 mg | Freq: Two times a day (BID) | INTRAVENOUS | Status: DC
  Administered 2021-10-04 – 2021-10-11 (×14): 500 mg via INTRAVENOUS
  Filled 2021-10-04 (×14): qty 100

## 2021-10-04 MED ORDER — DAKINS (1/4 STRENGTH) 0.125 % EX SOLN
Freq: Two times a day (BID) | CUTANEOUS | Status: AC | PRN
  Filled 2021-10-04: qty 473

## 2021-10-04 NOTE — Consult Note (Signed)
[facility] Nurse Consult Note: Reason for Consult: Right breast fungating tumor. Wound type: Neoplastic; non healing Pressure Injury POA: NA Measurement: see nursing flow sheets; large fungating tumor that covers the patient's entire left side; with depth  Wound bed: patient has performed her own She was previously followed by [facility] however I do not see any notes from Dr. Heber [place] since September 2022.  Periwound: with evidence of previous contraction, scarring from images in October 2022; no new images at this time  Dressing procedure/placement/frequency:  1. Silver hydrofiber (Aquacel Ag+ Advantage, [place] # [id number]) to manage exudate and topping with ABD pads and securing with a breast binder or mesh underwear (cut seam to make like tube top) Dressing changes are ordered for daily, but may also be performed PRN for drainage strike-through. POC is what was established by the [facility], the dressing that is currently being used is quite costly for a non healing wound but if this is managing odor and exudate would support continued use.  I typically use absorptive pads and could suggest even  the use of menstrual pads for use to contain cost a bit, patient may have home health or medical supplier covering expensive dressings at this time.  If odor is an issue crushed Flagyl can be used or Dakin's solution, both of which can be ordered per inpatient MDs.    Re consult if needed, will not follow at this time. Thanks  Ula Couvillon [employer], RN,CWOCN, CNS, [place] [phone])

## 2021-10-04 NOTE — H&P (Signed)
[facility] History and Physical  Misty Arnold BOF:[id number] DOB: November 01, 1971 DOA: 10/03/2021  Referring physician: Lindi Adie PCP: Nicholas Lose, MD   Chief Complaint: Pain, Fever, Weakness, draining wound  HPI: Misty Arnold is a 50 y.o. 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 She was seen in the outpatient cancer center palliative clinic today and was noted to have fever and possible wound infection. She has been failing at home and unable to care for herself and doing her own wound care.. She has progressive metastatic cancer on Ibrance.  Cancer history (from Dr. 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  1. Right mastectomy 08/29/2015 at [place] (Dr.Singh): IDC with invol of skin and skin ulceration, breast capsule inv cancer, superior medial margin positive, 1/1 LN positive, grade 3, 14.3 cm, 3.5 cm, ALI, chest wall involv, ER 90-100%, PR 80-90%, HER-2 negative, Ki 67 40-50% T4CN1 (St 3B) 2. Patient started tamoxifen but developed severe headache with severe hypertension and tamoxifen was discontinued 03/17/2016.  3. 02/2017 started on Exemestane, Zoladex started 05/29/2017 4.  Hospitalization for paraplegia: T9 vertebral body compression status post decompressive laminectomy and tumor resection 01/04/2020 5.  Palliative radiation to the thoracic spine 01/28/2020 6.  Ibrance with letrozole started April 2021 discontinued 07/24/2021 7.  Enhertu started 07/31/2021  every 3 weeks     Review of Systems:  Constitutional:  +weight loss, +night sweats, +Fevers, chills, fatigue.  HEENT:  No headaches, -Difficulty swallowing,+Tooth/dental problems,-Sore throat,   Cardio-vascular:  +chest pain, No Orthopnea, PND, swelling in lower extremities, anasarca, dizziness, palpitations  GI:  No heartburn, indigestion, abdominal pain, nausea, vomiting, diarrhea, change in bowel habits, loss of appetite  Resp:  +shortness of breath with exertion or at rest. No excess mucus, no productive cough, No non-productive cough, No coughing up of blood.No change in color of mucus.No wheezing.Skin:  no rash or lesions.  GU:  no dysuria, change in color of urine, no urgency or frequency. No flank pain.  Musculoskeletal:  No joint pain or swelling. +back pain.  Psych:  + depressed mood   No memory loss.   Past Medical History:  Diagnosis Date   Abnormal uterine bleeding    Anemia    Breast cancer ([place])    Depression    Fibroid    Hormone disorder    Hypertension    Infertility, female    Past Surgical History:  Procedure Laterality Date   ABDOMINAL HYSTERECTOMY     BREAST SURGERY     LAMINECTOMY N/A 01/04/2020   Procedure: Thoracic Eight, Thoracic Nine THORACIC LAMINECTOMY FOR TUMOR with Thoracic Seven-Thoracic Ten instrumentation;  Surgeon: [name], MD;  Location: [facility];  Service: Neurosurgery;  Laterality: N/A;  Thoracic Eight, Thoracic Nine THORACIC LAMINECTOMY FOR TUMOR with Thoracic Seven-Thoracic Ten instrumentation   MASTECTOMY     salivary gland stone     Social History:  reports that she has never smoked. She has never used smokeless tobacco. She reports that she does not currently use alcohol. She reports that she does not use drugs.  Allergies  Allergen Reactions  Amoxicillin     Other reaction(s): rash/itching   Penicillins Rash    Did it involve swelling of the face/tongue/throat, SOB, or low BP? no Did it involve sudden or severe rash/hives,  skin peeling, or any reaction on the inside of your mouth or nose? Yes Did you need to seek medical attention at a hospital or doctor's office? No When did it last happen? early 20's     If all above answers are "NO", may proceed with cephalosporin use.   Pork-Derived Products    Penicillin G    Tamoxifen Hypertension    Family History  Problem Relation Age of Onset   Stroke Mother    Heart attack Mother    Diabetes Mother    Hyperlipidemia Mother    Heart disease Mother    Diabetes Father    Stroke Father    Stroke Maternal Grandmother    Cancer Maternal Grandfather    Colon cancer Maternal Grandfather    Stroke Maternal Grandfather    Stroke Paternal Grandmother    Heart attack Paternal Grandmother    Heart failure Paternal Grandmother    Diabetes Paternal Grandmother    Hyperlipidemia Paternal Grandmother    CAD Paternal Grandmother    Cancer Paternal Grandfather    Colon cancer Paternal Grandfather    Cancer Maternal Aunt 27       breast   Stroke Maternal Aunt    Heart attack Maternal Aunt    Breast cancer Maternal Aunt      Prior to Admission medications   Medication Sig Start Date End Date Taking? Authorizing Provider  amLODipine (NORVASC) 10 MG tablet Take 1 tablet (10 mg total) by mouth daily. 05/13/21  Yes Dwyane Dee, MD  Ascorbic Acid (VITAMIN C PO) Take 1,000 mg by mouth daily.   Yes [provider]  bisacodyl (DULCOLAX) 10 MG suppository Place 1 suppository (10 mg total) rectally daily as needed for moderate constipation. 01/08/20  Yes Viona Gilmore D, NP  Calcium Carb-Cholecalciferol (OYSTER SHELL CALCIUM W/D) 500-200 MG-UNIT TABS TAKE 1 TABLET BY MOUTH TWICE A DAY WITH MEALS Patient taking differently: Take 1 tablet by mouth 2 (two) times daily with a meal. 03/23/21  Yes Nicholas Lose, MD  Cholecalciferol (VITAMIN D3) 125 MCG (5000 UT) CAPS TAKE 1 CAPSULE (5,000 UNITS TOTAL) BY MOUTH 2 (TWO) TIMES DAILY. Patient taking differently: Take 5,000 Units  by mouth in the morning and at bedtime. 08/07/21  Yes Nicholas Lose, MD  dexamethasone (DECADRON) 1 MG tablet Take 1 mg by mouth daily as needed (when appetite  is low). 08/07/21  Yes [provider]  diazepam (VALIUM) 5 MG tablet Take 1 tablet (5 mg total) by mouth every 12 (twelve) hours as needed for muscle spasms. 08/07/21  Yes Nicholas Lose, MD  goserelin (ZOLADEX) 3.6 MG injection Inject 3.6 mg into the skin every 28 (twenty-eight) days.   Yes [provider]  letrozole ([place]) 2.5 MG tablet TAKE 1 TABLET BY MOUTH EVERY DAY Patient taking differently: Take 2.5 mg by mouth daily. 09/20/20  Yes Nicholas Lose, MD  lidocaine-prilocaine (EMLA) cream APPLY TOPICALLY AS NEEDED. Patient taking differently: Apply 1 application topically See admin instructions. Use with zoladex injection 12/22/20 12/22/21 Yes Nicholas Lose, MD  ondansetron (ZOFRAN ODT) 4 MG disintegrating tablet Take 1 tablet (4 mg total) by mouth every 8 (eight) hours as needed for nausea or vomiting. 09/04/21  Yes Pahwani, Einar Grad, MD  palbociclib (IBRANCE) 125 MG tablet TAKE 1 TABLET (125 MG TOTAL) BY  MOUTH DAILY. TAKE FOR 21 DAYS ON, 7 DAYS OFF, REPEAT EVERY 28 DAYS. Patient taking differently: Take 125 mg by mouth See admin instructions. Take for 21 days, then off for 7 days, repeat every 28 days 06/22/21 06/22/22 Yes Nicholas Lose, MD  polyethylene glycol (MIRALAX / GLYCOLAX) 17 g packet Take 17 g by mouth daily as needed for mild constipation. 01/22/20  Yes Bayard Hugger, NP  oxyCODONE (OXYCONTIN) 15 mg 12 hr tablet Take 1 tablet (15 mg total) by mouth every 12 (twelve) hours. Patient not taking: Reported on 10/03/2021 10/03/21   Pickenpack-Cousar, Carlena Sax, NP  Oxycodone HCl 10 MG TABS Take 1 tablet (10 mg total) by mouth every 3 (three) hours as needed ((score 7 to 10)). Patient not taking: Reported on 10/03/2021 08/07/21   Nicholas Lose, MD  prochlorperazine (COMPAZINE) 10 MG tablet Take 1 tablet (10 mg total) by  mouth every 6 (six) hours as needed (Nausea or vomiting). 07/24/21 08/01/21  Nicholas Lose, MD   Physical Exam: Vitals:   10/03/21 1831 10/03/21 2147 10/04/21 0038 10/04/21 0459  BP: (!) 131/93 121/80 109/71 120/81  Pulse: (!) 126 (!) 127 (!) 127 (!) 117  Resp: 18 16 18 16   Temp: (!) 101.4 F (38.6 C) 99.8 F (37.7 C) 99.3 F (37.4 C) 98.7 F (37.1 C)  TempSrc: Oral Oral Oral Oral  SpO2: 100% 98% 98% 98%    Wt Readings from Last 3 Encounters:  10/03/21 54.6 kg  08/25/21 60 kg  07/24/21 58.2 kg    General:  Tearful,  uncomfortable Eyes: PERRL, normal lids, irises & conjunctiva ENT: grossly normal hearing, lips & tongue Neck: no LAD, masses or thyromegaly Cardiovascular: RRR, no m/r/g. No LE edema. Telemetry: SR, no arrhythmias  Respiratory: CTA bilaterally, no w/r/r. Normal respiratory effort. Abdomen: soft, ntnd Skin: massive chest wall wound-purulent and draining Musculoskeletal: LE weakness Psychiatric: anxious, depressed Neurologic: grossly non-focal similar to baseline          Labs on Admission:  Basic Metabolic Panel: Recent Labs  Lab 10/03/21 1407 10/04/21 0530  NA 138 139  K 3.6 3.5  CL 102 106  CO2 28 24  GLUCOSE 84 107*  BUN 13 10  CREATININE 0.84 0.68  CALCIUM 9.9 8.4*  MG  --  2.0   Liver Function Tests: Recent Labs  Lab 10/03/21 1407 10/04/21 0530  AST 40 30  ALT 29 21  ALKPHOS 78 61  BILITOT 0.4 0.4  PROT 8.0 6.5  ALBUMIN 3.7 2.9*   No results for input(s): LIPASE, AMYLASE in the last 168 hours. No results for input(s): AMMONIA in the last 168 hours. CBC: Recent Labs  Lab 10/03/21 1407 10/04/21 0530  WBC 7.9 6.3  NEUTROABS 6.2  --   HGB 11.1* 9.9*  HCT 33.2* 29.9*  MCV 90.7 92.0  PLT 476* 381   Cardiac Enzymes: No results for input(s): CKTOTAL, CKMB, CKMBINDEX, TROPONINI in the last 168 hours.  BNP (last 3 results) Recent Labs    05/11/21 2124  BNP 70.9    ProBNP (last 3 results) No results for input(s): PROBNP in  the last 8760 hours.  CBG: No results for input(s): GLUCAP in the last 168 hours.  Radiological Exams on Admission: No results found.  EKG: Independently reviewed.   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 She will need need medical management, possible surgical management of this wound depending on her goals of care. She is processing her disease progression and for now needs pain control before we can discuss next steps. WOC consult for complex wound management. Will obtain chest wall imaging.  2. Cancer related pain Will resume her oxycontin 15 BID and use Morphine 55m q2prn for breakthrough pain. Will consider wound bed pain options, also started lyrica QHS.  3. Malnutrition, dehydration Low albumin, thin, clinically appears dry. Will give NS at 100cc/hr and consult nutrition.    Code Status: Full code will have ongoing conversations with her about this. CPR given her chest wall would not be recommended or helpful to her in the event of cardiac arrest given her metastatic disease. DVT Prophylaxis: She has an allergy to pork products-will ask pharmacy to recommedn alternatives-she is high risk for VTE given malignany and immobility. Family Communication: Patient has full capacity Disposition Plan: She will need either a very high level of in home care and support or an assisited living type facility for care. Time spent: 50 min  EHoaglandPager [phone]

## 2021-10-04 NOTE — Progress Notes (Signed)
Patient performed own wound dressing care on right breast with normal saline, packed with aquacell and covered with aquacell AG, with ABD pads and secured with abdominal binder.

## 2021-10-04 NOTE — Progress Notes (Signed)
   Palliative Medicine Inpatient Follow Up Note     Chart Reviewed. Patient assessed at the bedside.   Ms. Balzarini is sitting on side of the bed. Continues to complain of weakness and fatigue. Reports some improve in her appetite. Continues to endorse pain in right breast/chest area however is appreciative this is much more controlled. She was seen by Wound Care Nurse earlier.   Understands plans for CT scans for further disease evaluation. She is tolerating IV antibiotics.   Her significant other is at bedside. Updates provided and all questions answered.   Patient is emotional expressing her appreciation of the care and tentativeness of her health needs. Emotional support provided.   Discussed the importance of continued conversation with family and their  medical providers regarding overall plan of care and treatment options, ensuring decisions are within the context of the patients values and GOCs.  Questions addressed and support provided.    Objective Assessment: Vital Signs Vitals:   10/04/21 1157 10/04/21 1430  BP: 119/85 (!) 125/91  Pulse: (!) 112 (!) 111  Resp: 18 18  Temp: 98.1 F (36.7 C) 98.5 F (36.9 C)  SpO2: 98% 96%    Intake/Output Summary (Last 24 hours) at 10/04/2021 1706 Last data filed at 10/04/2021 1600 Gross per 24 hour  Intake 2301.17 ml  Output --  Net 2301.17 ml   Last Weight  Most recent update: 10/04/2021  4:15 PM    Weight  54.5 kg (120 lb 3.2 oz)            Gen:  NAD, weak and frail appearing  HEENT: moist mucous membranes CV: Regular rate and rhythm, no murmurs rubs or gallops PULM: clear to auscultation bilaterally. No wheezes/rales/rhonchi, right sided large fungating breast mass, dressing clean/fry/intact and secured with binder ABD: soft/nontender/nondistended/normal bowel sounds EXT: No edema Neuro: Alert and oriented x3, mood appropriate  SUMMARY OF RECOMMENDATIONS   Continue with current plan of care  Pain much more  controlled. Will continue current regimen Patient is tolerating IV antibiotics Pending CT scans  PMT will continue to support and follow. Please secure chat for urgent needs.   Discussed with Dr. Hilma Favors   Time Total: 35 min.   Visit consisted of counseling and education dealing with the complex and emotionally intense issues of symptom management and palliative care in the setting of serious and potentially life-threatening illness.Greater than 50%  of this time was spent counseling and coordinating care related to the above assessment and plan.  [name], AGPCNP-BC  Palliative Medicine Team [phone]  Palliative Medicine Team providers are available by phone from 7am to 7pm daily and can be reached through the team cell phone. Should this patient require assistance outside of these hours, please call the patient's attending physician.

## 2021-10-05 ENCOUNTER — Inpatient Hospital Stay (HOSPITAL_COMMUNITY): Payer: Medicare PPO

## 2021-10-05 DIAGNOSIS — E440 Moderate protein-calorie malnutrition: Secondary | ICD-10-CM | POA: Diagnosis present

## 2021-10-05 MED ORDER — ENOXAPARIN SODIUM 40 MG/0.4ML IJ SOSY
40.0000 mg | PREFILLED_SYRINGE | INTRAMUSCULAR | Status: DC
  Administered 2021-10-05 – 2021-10-15 (×11): 40 mg via SUBCUTANEOUS
  Filled 2021-10-05 (×11): qty 0.4

## 2021-10-05 MED ORDER — VANCOMYCIN HCL 1000 MG/200ML IV SOLN
1000.0000 mg | INTRAVENOUS | Status: DC
  Administered 2021-10-05 – 2021-10-10 (×6): 1000 mg via INTRAVENOUS
  Filled 2021-10-05 (×6): qty 200

## 2021-10-05 MED ORDER — IOHEXOL 350 MG/ML SOLN
60.0000 mL | Freq: Once | INTRAVENOUS | Status: AC | PRN
  Administered 2021-10-05: 60 mL via INTRAVENOUS

## 2021-10-05 MED ORDER — PROSOURCE PLUS PO LIQD
30.0000 mL | Freq: Three times a day (TID) | ORAL | Status: DC
  Administered 2021-10-05 – 2021-10-12 (×20): 30 mL via ORAL
  Filled 2021-10-05 (×19): qty 30

## 2021-10-05 NOTE — Progress Notes (Signed)
Daily Progress Note   Patient Name: Misty Arnold       Date: 10/05/2021 DOB: 01-Jun-1971  Age: 50 y.o. MRN#: [id number] Attending Physician: Acquanetta Chain, DO Primary Care Physician: Nicholas Lose, MD Admit Date: 10/03/2021  Reason for Consultation/Follow-up: Pain control  Subjective: Misty Arnold reports feeling significantly better today. Less wound drainage and pain. She feels stronger and has been able to get out of bed. She feels like her appetite is coming back.  Length of Stay: 2  Current Medications: Scheduled Meds:  . (feeding supplement) PROSource Plus  30 mL Oral TID BM  . oxyCODONE  15 mg Oral Q12H  . pregabalin  50 mg Oral QHS  . senna-docusate  1 tablet Oral QHS  . sodium chloride flush  3 mL Intravenous Q12H  . thiamine injection  100 mg Intravenous Daily    Continuous Infusions: . sodium chloride 100 mL/hr at 10/05/21 1000  . ceFEPime (MAXIPIME) IV Stopped (10/05/21 0545)  . metronidazole 500 mg (10/05/21 0407)  . vancomycin      PRN Meds: diazepam, lip balm, morphine injection, sodium hypochlorite  Physical Exam          Vital Signs: BP 109/77   Pulse 95   Temp (!) 97.5 F (36.4 C) (Oral)   Resp 19   Ht 5' 5" (1.651 m)   Wt 54.5 kg   SpO2 93%   BMI 20.00 kg/m  SpO2: SpO2: 93 % O2 Device: O2 Device: Room Air O2 Flow Rate:    Intake/output summary:  Intake/Output Summary (Last 24 hours) at 10/05/2021 1224 Last data filed at 10/05/2021 6546 Gross per 24 hour  Intake 2745.35 ml  Output 1200 ml  Net 1545.35 ml   LBM: Last BM Date: 10/04/21 Baseline Weight: Weight: 54.5 kg Most recent weight: Weight: 54.5 kg       Palliative Assessment/Data:      Patient Active Problem List   Diagnosis Date Noted  . Malnutrition of moderate degree  10/05/2021  . Breast wound 08/28/2021  . Infected wound 08/26/2021  . Essential hypertension 08/26/2021  . Wound infection 05/12/2021  . Cancer related pain 05/12/2021  . AKI (acute kidney injury) ([place]) 05/11/2021  . Palliative care patient 02/15/2020  . Myelopathy ([place]) 01/08/2020  . Incomplete paraplegia ([place]) 01/08/2020  . Epidural mass   .  Metastatic breast cancer ([place])   . Weakness of both legs   . Leucocytosis   . Acute blood loss anemia   . Neurogenic bladder   . Neurogenic bowel   . S/P laminectomy 01/04/2020  . 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[place]) 01/04/2020  . Breast cancer of upper-outer quadrant of right female breast ([place]) 11/02/2014    Palliative Care Assessment & Plan   Patient Profile: Misty Arnold is a 50 y.o. 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 She was seen in the outpatient cancer center palliative clinic today and was noted to have fever and possible wound infection. She has been failing at home and unable to care for herself and doing her own wound care. She has progressive metastatic cancer on Ibrance.  Cancer history (from Dr. 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   1. Right mastectomy 08/29/2015 at [place] (Dr.Singh): IDC with invol of skin and skin ulceration, breast capsule inv cancer, superior medial margin positive, 1/1 LN positive, grade 3, 14.3 cm, 3.5 cm, ALI, chest wall involv, ER 90-100%, PR 80-90%, HER-2  negative, Ki 67 40-50% T4CN1 (St 3B) 2. Patient started tamoxifen but developed severe headache with severe hypertension and tamoxifen was discontinued 03/17/2016.  3. 02/2017 started on Exemestane, Zoladex started 05/29/2017 4.  Hospitalization for paraplegia: T9 vertebral body compression status post decompressive laminectomy and tumor resection 01/04/2020 5.  Palliative radiation to the thoracic spine 01/28/2020 6.  Ibrance with letrozole started April 2021 discontinued 07/24/2021 7.  Enhertu started 07/31/2021 every 3 weeks   Assessment: Infection of Malignant Chest Wall Wound, Fungating Mass Signs of systemic infection with fever, tachycardia and worsening pain and wound drainage have now improved on broad spectrum abx Vanc, cefipime and flagyl. CT scan ordered yesterday has not been done, but I am concerned about deeper source of infection.  WOC consult for complex wound management- need complete assessment-there is an area that may track into the deeper tissue and may need packing or opening in order to not be an ongoing infection tract. Waiting on chest wall imaging. Will explore all options for treating and managing this malignant wound. Continue current abx-they are clearly helping.   2. Cancer related pain Will resume her oxycontin 15 BID and use Morphine 9m q2prn for breakthrough pain. Will consider wound bed pain options, also started lyrica QHS- she has responded very well to this regimen- will continue.   3. Malnutrition, dehydration Low albumin, thin, clinically appears dry. Will give NS at 100cc/hr and consult nutrition. Appreciate Nutrition recs-she asking for a nutritional supplement that she got in rad onc-will try to provide for her if possible.     Code Status: Full code will have ongoing conversations with her about this. CPR given her chest wall would not be recommended or helpful to her in the event of cardiac arrest given her metastatic disease. DVT Prophylaxis: She has  tolerated Lovenox in past- will start this today for DVT prophylaxis Family Communication: Patient has full capacity Disposition Plan: She will need either a very high level of in  home care and support or an assisited living type facility for care- she is motivated to care for herself.      Greater than 50%  of this time was spent counseling and coordinating care related to the above assessment and plan.  Lane Hacker, DO  Please contact Palliative Medicine Team phone at [phone] for questions and concerns.

## 2021-10-05 NOTE — Progress Notes (Signed)
Initial Nutrition Assessment  DOCUMENTATION CODES:   Non-severe (moderate) malnutrition in context of chronic illness  INTERVENTION:   -Prosource Plus PO TID, each provides 100 kcals and 15g protein  -Encouraged good PO intakes  -Researched where pt can purchase Boost Soothe for home. Placed in discharge instructions.  NUTRITION DIAGNOSIS:   Moderate Malnutrition related to chronic illness, cancer and cancer related treatments as evidenced by percent weight loss, moderate muscle depletion.  GOAL:   Patient will meet greater than or equal to 90% of their needs  MONITOR:   PO intake, Supplement acceptance, Labs, Weight trends, I & O's, Skin  REASON FOR ASSESSMENT:   Malnutrition Screening Tool    ASSESSMENT:   50 y.o. 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 She was seen in the outpatient cancer center palliative clinic today and was noted to have fever and possible wound infection. She has been failing at home and unable to care for herself and doing her own wound care.. She has progressive metastatic cancer on Ibrance.  Patient reports feeling so much better since receiving IV antibiotics. States she was in a lot of pain PTA and wasn't able to eat like she knows she should. Pt conscientious about what she eats and tries to eat the best she can when appetite is good. Pt wants to resume the Prosource supplements she was ordered in a previous admission. States she doesn't want Boost Breeze given the sugar content. Wanted to see if we could supply Boost Soothe as she preferred the flavor of this. Reached out to [facility] to see if they had any in stock, none available. Placed information on where pt can order and have it shipped to her when she returns home.    This morning pt consumed boiled egg, sausage, yogurt and cereal with milk. Plans to eat her orange later.   Per pt, she weighed ~128 lbs in September 2022 which was confirmed in weight records.Pt has lost 8 lbs since, this is a 6% wt loss x 2.5 months which is significant for time frame.  Medications: Senokot, Thiamine  Labs reviewed.  NUTRITION - FOCUSED PHYSICAL EXAM:  Flowsheet Row Most Recent Value  Orbital Region No depletion  Upper Arm Region Mild depletion  Thoracic and Lumbar Region Unable to assess  Buccal Region No depletion  Temple Region Mild depletion  Clavicle Bone Region Moderate depletion  Clavicle and Acromion Bone Region Moderate depletion  Scapular Bone Region No depletion  Dorsal Hand No depletion  Patellar Region No depletion  Anterior Thigh Region No depletion  Posterior Calf Region No depletion  Edema (RD Assessment) None  Hair Unable to assess  [covered]  Eyes Reviewed  Mouth Reviewed  Skin Reviewed       Diet Order:   Diet Order             Diet regular Room service appropriate? Yes; Fluid consistency: Thin  Diet effective now                   EDUCATION NEEDS:   Education needs have been addressed  Skin:  Skin Assessment: Reviewed RN Assessment  Last BM:  11/30  Height:   Ht Readings from Last 1 Encounters:  10/04/21 5\' 5"  (1.651 m)    Weight:   Wt Readings from Last 1 Encounters:  10/04/21  54.5 kg    BMI:  Body mass index is 20 kg/m.  Estimated Nutritional Needs:   Kcal:  1850-2050  Protein:  85-100g  Fluid:  2L/day  Clayton Bibles, MS, RD, LDN Inpatient Clinical Dietitian Contact information available via Amion

## 2021-10-05 NOTE — Plan of Care (Signed)
  Problem: Education: Goal: Knowledge of General Education information will improve Description: Including pain rating scale, medication(s)/side effects and non-pharmacologic comfort measures Outcome: Progressing   Problem: Health Behavior/Discharge Planning: Goal: Ability to manage health-related needs will improve Outcome: Progressing   Problem: Clinical Measurements: Goal: Ability to maintain clinical measurements within normal limits will improve Outcome: Progressing Goal: Will remain free from infection Outcome: Progressing Goal: Diagnostic test results will improve Outcome: Progressing Goal: Respiratory complications will improve Outcome: Progressing Goal: Cardiovascular complication will be avoided Outcome: Progressing   Problem: Activity: Goal: Risk for activity intolerance will decrease Outcome: Progressing   Problem: Nutrition: Goal: Adequate nutrition will be maintained Outcome: Progressing   Problem: Coping: Goal: Level of anxiety will decrease Outcome: Progressing   Problem: Elimination: Goal: Will not experience complications related to bowel motility Outcome: Progressing Goal: Will not experience complications related to urinary retention Outcome: Progressing   Problem: Pain Managment: Goal: General experience of comfort will improve Outcome: Progressing   Problem: Safety: Goal: Ability to remain free from injury will improve Outcome: Progressing   Problem: Skin Integrity: Goal: Risk for impaired skin integrity will decrease Outcome: Progressing  Alert and oriented x 4. Continued to have pain, managed by current pain regimen. IV abt and fluids infused and tolerated well. Wound dressings changed per orders, pat participated. Will continue plan of care.

## 2021-10-05 NOTE — Progress Notes (Signed)
Pharmacy Antibiotic Note  Misty Arnold is a 50 y.o. female admitted on 10/03/2021 with recurrent wound infection.  Previous hospitalization from 08/25/21-09/05/21 for worsening R chest wall pain and infection.  PMH includes metastatic breast cancer (receiving Letrozole and Ibrance).  Pharmacy has been consulted for vancomycin and cefepime dosing for chest wall wound infection, fungating mass.   Today, 10/05/21 WBC WNL Afebrile SCr = 0.68. However, given paraplegia and that TBW < IBW, will round Scr to 1 for vancomycin dosing  Plan: Continue cefepime 2g IV q8 hours Reduce vancomycin dose to 1000 mg IV q24h for estimated AUC of 504 Follow renal function, culture data. Check vancomycin levels at steady state as needed  Height: 5\' 5"  (165.1 cm) Weight: 54.5 kg (120 lb 3.2 oz) IBW/kg (Calculated) : 57  Temp (24hrs), Avg:98 F (36.7 C), Min:97.2 F (36.2 C), Max:98.7 F (37.1 C)  Recent Labs  Lab 10/03/21 1407 10/04/21 0530  WBC 7.9 6.3  CREATININE 0.84 0.68     Estimated Creatinine Clearance: 70.8 mL/min (by C-G formula based on SCr of 0.68 mg/dL).    Allergies  Allergen Reactions   Amoxicillin     Other reaction(s): rash/itching   Penicillins Rash    Did it involve swelling of the face/tongue/throat, SOB, or low BP? no Did it involve sudden or severe rash/hives, skin peeling, or any reaction on the inside of your mouth or nose? Yes Did you need to seek medical attention at a hospital or doctor's office? No When did it last happen? early 20's     If all above answers are "NO", may proceed with cephalosporin use.   Pork-Derived Products    Penicillin G    Tamoxifen Hypertension    Antimicrobials this admission: Vancomycin 11/29 >>  Cefepime 11/29 >>  Flagyl 11/29 >>  Dose adjustments this admission: 12/1: Vancomycin 1250 q24h >> 1000 mg IV q24h  Microbiology results: 11/29 BCx: ngtd  Thank you for allowing pharmacy to be a part of this patient's care.  Lenis Noon, PharmD 10/05/21 10:45 AM

## 2021-10-05 NOTE — Discharge Instructions (Signed)
Where to buy Boost Soothe, flavor Peach+mint: -Amazon.com $29.99 per pack of 6 -[email or web address] on autoship $33.59 per case of 24 bottles **better deal** -[email or web address] -$52.87 per case of 24 bottles  (auto delivery will save 10%)

## 2021-10-06 LAB — CREATININE, SERUM
Creatinine, Ser: 0.68 mg/dL (ref 0.44–1.00)
GFR, Estimated: >60 mL/min (ref >60)

## 2021-10-06 MED ORDER — POLYETHYLENE GLYCOL 3350 17 G PO PACK
17.0000 g | PACK | Freq: Every day | ORAL | Status: DC
Start: 2021-10-06 — End: 2021-10-16
  Administered 2021-10-06 – 2021-10-16 (×10): 17 g via ORAL
  Filled 2021-10-06 (×10): qty 1

## 2021-10-06 MED ORDER — THIAMINE HCL 100 MG PO TABS
100.0000 mg | ORAL_TABLET | Freq: Every day | ORAL | Status: DC
  Administered 2021-10-07 – 2021-10-16 (×10): 100 mg via ORAL
  Filled 2021-10-06 (×10): qty 1

## 2021-10-06 NOTE — Progress Notes (Signed)
PHARMACIST - PHYSICIAN COMMUNICATION  DR:   Hilma Favors  CONCERNING: IV to Oral Route Change Policy  RECOMMENDATION: This patient is receiving thiamine by the intravenous route.  Based on criteria approved by the Pharmacy and Therapeutics Committee, the intravenous medication(s) is/are being converted to the equivalent oral dose form(s).   DESCRIPTION: These criteria include: The patient is eating (either orally or via tube) and/or has been taking other orally administered medications for a least 24 hours The patient has no evidence of active gastrointestinal bleeding or impaired GI absorption (gastrectomy, short bowel, patient on TNA or NPO).  If you have questions about this conversion, please contact the Pharmacy Department  []   [phone] )  [facility] []   [phone] )  [facility] []   [phone] )  [facility] []   [phone] )  [facility] [x]   [phone] )  [place], [facility] 10/06/2021 11:05 AM

## 2021-10-06 NOTE — Care Management Important Message (Signed)
Important Message  Patient Details No IM given Patient with Palliative Care. Name: Misty Arnold MRN: [id number] Date of Birth: [birth year]   Medicare Important Message Given:  No     Kerin Salen 10/06/2021, 10:57 AM

## 2021-10-07 DIAGNOSIS — E876 Hypokalemia: Secondary | ICD-10-CM

## 2021-10-07 DIAGNOSIS — G893 Neoplasm related pain (acute) (chronic): Secondary | ICD-10-CM | POA: Diagnosis not present

## 2021-10-07 DIAGNOSIS — C50411 Malignant neoplasm of upper-outer quadrant of right female breast: Secondary | ICD-10-CM

## 2021-10-07 DIAGNOSIS — Z170 Estrogen receptor positive status [ER+]: Secondary | ICD-10-CM | POA: Diagnosis not present

## 2021-10-07 LAB — CBC
HCT: 28 % — ABNORMAL LOW (ref 36.0–46.0)
Hemoglobin: 9 g/dL — ABNORMAL LOW (ref 12.0–15.0)
MCH: 29.6 pg (ref 26.0–34.0)
MCHC: 32.1 g/dL (ref 30.0–36.0)
MCV: 92.1 fL (ref 80.0–100.0)
Platelets: 363 10*3/uL (ref 150–400)
RBC: 3.04 MIL/uL — ABNORMAL LOW (ref 3.87–5.11)
RDW: 15.5 % (ref 11.5–15.5)
WBC: 4.7 10*3/uL (ref 4.0–10.5)
nRBC: 0 % (ref 0.0–0.2)

## 2021-10-07 LAB — BASIC METABOLIC PANEL
Anion gap: 6 (ref 5–15)
BUN: 7 mg/dL (ref 6–20)
CO2: 26 mmol/L (ref 22–32)
Calcium: 8.7 mg/dL — ABNORMAL LOW (ref 8.9–10.3)
Chloride: 106 mmol/L (ref 98–111)
Creatinine, Ser: 0.59 mg/dL (ref 0.44–1.00)
GFR, Estimated: >60 mL/min (ref >60)
Glucose, Bld: 93 mg/dL (ref 70–99)
Potassium: 3.1 mmol/L — ABNORMAL LOW (ref 3.5–5.1)
Sodium: 138 mmol/L (ref 135–145)

## 2021-10-07 LAB — VITAMIN D 25 HYDROXY (VIT D DEFICIENCY, FRACTURES): Vit D, 25-Hydroxy: 88.43 ng/mL (ref 30–100)

## 2021-10-07 MED ORDER — POTASSIUM CHLORIDE CRYS ER 20 MEQ PO TBCR: 20.0000 meq | EXTENDED_RELEASE_TABLET | Freq: Two times a day (BID) | ORAL | Status: DC

## 2021-10-07 MED ORDER — POTASSIUM CHLORIDE CRYS ER 20 MEQ PO TBCR
20.0000 meq | EXTENDED_RELEASE_TABLET | Freq: Two times a day (BID) | ORAL | Status: DC
  Administered 2021-10-07 – 2021-10-08 (×2): 20 meq via ORAL
  Filled 2021-10-07 (×2): qty 1

## 2021-10-07 MED ORDER — POTASSIUM CHLORIDE CRYS ER 20 MEQ PO TBCR
40.0000 meq | EXTENDED_RELEASE_TABLET | Freq: Once | ORAL | Status: AC
  Administered 2021-10-07: 40 meq via ORAL
  Filled 2021-10-07: qty 2

## 2021-10-07 NOTE — Progress Notes (Signed)
Daily Progress Note   Patient Name: Misty Arnold       Date: 10/07/2021 DOB: May 31, 1971  Age: 50 y.o. MRN#: [id number] Attending Physician: Acquanetta Chain, DO Primary Care Physician: Nicholas Lose, MD Admit Date: 10/03/2021  Reason for Consultation/Follow-up: Pain control  Subjective: No new issues or complaints today. She continues to feel better since admission. She has questions about her home meds. Pain is well controlled.  Length of Stay: 4  Current Medications: Scheduled Meds:  . (feeding supplement) PROSource Plus  30 mL Oral TID BM  . enoxaparin (LOVENOX) injection  40 mg Subcutaneous Q24H  . oxyCODONE  15 mg Oral Q12H  . polyethylene glycol  17 g Oral Daily  . pregabalin  50 mg Oral QHS  . senna-docusate  1 tablet Oral QHS  . sodium chloride flush  3 mL Intravenous Q12H  . thiamine  100 mg Oral Daily    Continuous Infusions: . sodium chloride 100 mL/hr at 10/06/21 1123  . ceFEPime (MAXIPIME) IV 2 g (10/07/21 7639)  . metronidazole 500 mg (10/07/21 0406)  . vancomycin 1,000 mg (10/06/21 2001)    PRN Meds: diazepam, lip balm, morphine injection, sodium hypochlorite  Physical Exam          Vital Signs: BP 133/87 (BP Location: Right Arm)   Pulse (!) 107   Temp 99.7 F (37.6 C) (Oral)   Resp 18   Ht _0  (1.651 m)   Wt 54.5 kg   SpO2 100%   BMI 20.00 kg/m  SpO2: SpO2: 100 % O2 Device: O2 Device: Room Air O2 Flow Rate:    Intake/output summary:  Intake/Output Summary (Last 24 hours) at 10/07/2021 0829 Last data filed at 10/06/2021 1605 Gross per 24 hour  Intake 992.02 ml  Output --  Net 992.02 ml    LBM: Last BM Date: 10/04/21 Baseline Weight: Weight: 54.5 kg Most recent weight: Weight: 54.5 kg       Palliative  Assessment/Data:      Patient Active Problem List   Diagnosis Date Noted  . Malnutrition of moderate degree 10/05/2021  . Breast wound 08/28/2021  . Infected wound 08/26/2021  . Essential hypertension 08/26/2021  . Wound infection 05/12/2021  . Cancer related pain 05/12/2021  . AKI (acute kidney injury) ([place]) 05/11/2021  . Palliative care patient 02/15/2020  .  Myelopathy ([place]) 01/08/2020  . Incomplete paraplegia ([place]) 01/08/2020  . Epidural mass   . Metastatic breast cancer ([place])   . Weakness of both legs   . Leucocytosis   . Acute blood loss anemia   . Neurogenic bladder   . Neurogenic bowel   . S/P laminectomy 01/04/2020  . 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 Breast cancer of upper-outer quadrant of right female breast ([place]) 11/02/2014    Palliative Care Assessment & Plan   Patient Profile: Misty Arnold is a 50 y.o. 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 She was seen in the outpatient cancer center palliative clinic today and was noted to have fever and possible wound infection. She has been failing at home and unable to care for herself and doing her own wound care. She has progressive metastatic cancer on Ibrance.  Cancer history (from Dr. 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   1. Right mastectomy 08/29/2015 at [facility] (Dr.Singh): IDC with invol of skin and skin ulceration, breast capsule inv cancer, superior medial  margin positive, 1/1 LN positive, grade 3, 14.3 cm, 3.5 cm, ALI, chest wall involv, ER 90-100%, PR 80-90%, HER-2 negative, Ki 67 40-50% T4CN1 (St 3B) 2. Patient started tamoxifen but developed severe headache with severe hypertension and tamoxifen was discontinued 03/17/2016.  3. 02/2017 started on Exemestane, Zoladex started 05/29/2017 4.  Hospitalization for paraplegia: T9 vertebral body compression status post decompressive laminectomy and tumor resection 01/04/2020 5.  Palliative radiation to the thoracic spine 01/28/2020 6.  Ibrance with letrozole started April 2021 discontinued 07/24/2021 7.  Enhertu started 07/31/2021 every 3 weeks   Assessment: Infection of Malignant Chest Wall Wound, Fungating Mass Signs of systemic infection with fever, tachycardia and worsening pain and wound drainage have now improved on broad spectrum abx Vanc, cefipime and flagyl. CT scan unfortunately confirms deeper disease and involvement of the bone structures of her chest wall and worsening ulcerations and overall interval progression of her cancer including a new liver lesion.  Maintain IV antibiotics given bone involvement Contacted Radiation Oncology who will see her Monday-(Dr. Kinard)  Continue current drg changes She would like to start Enhertu and wants to speak with Dr. Lindi Adie about this as soon as possible.   2. Cancer related pain Oxycontin 15 BID and use Morphine 67m q2prn for breakthrough pain.  She has responded very well to this regimen- will continue.   3. Malnutrition, dehydration Low albumin, thin, clinically appears dry. Will give NS at 100cc/hr and consult nutrition. Appreciate Nutrition recs-she asking for a nutritional supplement that she got in rad onc-will try to provide for her if possible.     Code Status: Full code will have ongoing conversations with her about this. CPR given her chest wall would not be recommended or helpful to her in the event of cardiac arrest given her metastatic  disease. DVT Prophylaxis: She has tolerated Lovenox in past- will start this today for DVT prophylaxis Family Communication: Patient has full capacity Disposition Plan: She will need either a very high level of  in home care and support or an assisited living type facility for care- she is motivated to care for herself but has significant needs. Will ask PT OT to see her for mobility recs.      Greater than 50%  of this time was spent counseling and coordinating care related to the above assessment and plan.  Lane Hacker, DO  Please contact Palliative Medicine Team phone at [phone] for questions and concerns.

## 2021-10-07 NOTE — Progress Notes (Signed)
Daily Progress Note   Patient Name: Misty Arnold       Date: 10/07/2021 DOB: October 10, 1971  Age: 50 y.o. MRN#: [id number] Attending Physician: Acquanetta Chain, DO Primary Care Physician: Nicholas Lose, MD Admit Date: 10/03/2021  Reason for Consultation/Follow-up: Pain control  Subjective: Chart reviewed including personal review of imaging.  I met today with Misty Arnold and her significant other, Marjory Lies.  We reviewed clinical course and discussed care plan moving forward.  She reports pain is well controlled on current regimen.  Overall, she reports that she is feeling much better than admission.  We reviewed plan for evaluation by oncology and radiation oncology.  She also tells me that she has also reached out to [facility] regarding their stereotactic radiosurgery program.  She would like to discuss this and other options for radiation management further with radiation oncology on Monday.  Length of Stay: 4  Current Medications: Scheduled Meds:  . (feeding supplement) PROSource Plus  30 mL Oral TID BM  . enoxaparin (LOVENOX) injection  40 mg Subcutaneous Q24H  . oxyCODONE  15 mg Oral Q12H  . polyethylene glycol  17 g Oral Daily  . potassium chloride  20 mEq Oral BID  . pregabalin  50 mg Oral QHS  . senna-docusate  1 tablet Oral QHS  . sodium chloride flush  3 mL Intravenous Q12H  . thiamine  100 mg Oral Daily    Continuous Infusions: . sodium chloride 100 mL/hr at 10/06/21 1123  . ceFEPime (MAXIPIME) IV 2 g (10/07/21 1321)  . metronidazole 500 mg (10/07/21 1533)  . vancomycin 1,000 mg (10/06/21 2001)    PRN Meds: diazepam, lip balm, morphine injection  Physical Exam        General: Alert, awake, in no acute distress.   HEENT: No bruits, no goiter, no JVD Heart: Regular rate  and rhythm. No murmur appreciated. Lungs: Good air movement, clear Abdomen: Soft, nontender, nondistended, positive bowel sounds.   Ext: No significant edema Skin: Warm and dry, chest wound not examined Neuro: Grossly intact, nonfocal.    Vital Signs: BP 129/89 (BP Location: Right Arm)   Pulse (!) 109   Temp 98.8 F (37.1 C) (Oral)   Resp 18   Ht 5' 5"  (1.651 m)   Wt 54.5 kg   SpO2 96%   BMI 20.00  kg/m  SpO2: SpO2: 96 % O2 Device: O2 Device: Room Air O2 Flow Rate:    Intake/output summary:  Intake/Output Summary (Last 24 hours) at 10/07/2021 1839 Last data filed at 10/07/2021 1828 Gross per 24 hour  Intake 235 ml  Output --  Net 235 ml    LBM: Last BM Date: 10/04/21 Baseline Weight: Weight: 54.5 kg Most recent weight: Weight: 54.5 kg       Palliative Assessment/Data:      Patient Active Problem List   Diagnosis Date Noted  . Malnutrition of moderate degree 10/05/2021  . Breast wound 08/28/2021  . Infected wound 08/26/2021  . Essential hypertension 08/26/2021  . Wound infection 05/12/2021  . Cancer related pain 05/12/2021  . AKI (acute kidney injury) ([place]) 05/11/2021  . Palliative care patient 02/15/2020  . Myelopathy ([place]) 01/08/2020  . Incomplete paraplegia ([place]) 01/08/2020  . Epidural mass   . Metastatic breast cancer ([place])   . Weakness of both legs   . Leucocytosis   . Acute blood loss anemia   . Neurogenic bladder   . Neurogenic bowel   . S/P laminectomy 01/04/2020  . 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 Breast cancer of upper-outer quadrant of right female breast ([place]) 11/02/2014    Palliative Care Assessment & Plan   Patient Profile: Misty Arnold is a 50 y.o. 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 She  was seen in the outpatient cancer center palliative clinic today and was noted to have fever and possible wound infection. She has been failing at home and unable to care for herself and doing her own wound care. She has progressive metastatic cancer on Ibrance.  Cancer history (from Dr. 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   1. Right mastectomy 08/29/2015 at [facility] (Dr.Singh): IDC with invol of skin and skin ulceration, breast capsule inv cancer, superior medial margin positive, 1/1 LN positive, grade 3, 14.3 cm, 3.5 cm, ALI, chest wall involv, ER 90-100%, PR 80-90%, HER-2 negative, Ki 67 40-50% T4CN1 (St 3B) 2. Patient started tamoxifen but developed severe headache with severe hypertension and tamoxifen was discontinued 03/17/2016.  3. 02/2017 started on Exemestane, Zoladex started 05/29/2017 4.  Hospitalization for paraplegia: T9 vertebral body compression status post decompressive laminectomy and tumor resection 01/04/2020 5.  Palliative radiation to the thoracic spine 01/28/2020 6.  Ibrance with letrozole started April 2021 discontinued 07/24/2021 7.  Enhertu started 07/31/2021 every 3 weeks   Assessment: Infection of Malignant Chest Wall Wound, Fungating Mass Signs of systemic infection with fever, tachycardia and worsening pain and wound drainage have now improved on broad spectrum abx Vanc, cefipime and flagyl. CT scan unfortunately confirms deeper disease and involvement of the bone structures of her chest wall and worsening ulcerations and overall interval progression of her cancer including a new liver lesion.  Continue IV antibiotics Plan for evaluation by radiation oncology on  Monday Continue current drg changes She would like to start on Enhertu and is hoping she and her significant other can discuss with Dr. Lindi Adie about this as  soon as possible.   2. Cancer related pain Oxycontin 15 BID and use Morphine 71m q2prn for breakthrough pain.  She has responded very well to this regimen- will continue.   3. Malnutrition, dehydration Low albumin, thin, clinically appears dry. Will give NS at 100cc/hr and consult nutrition. Appreciate Nutrition recs-she asking for a nutritional supplement that she got in rad onc-will try to provide for her if possible.  4. Hypokalemia Will replete.  Recheck BMP and Mg in AM.  5. Anemia Hgb downtrending.  Suspect acute drop is dilutional.  No signs of active bleeding.  Recheck in AM.     Code Status: Full code will have ongoing conversations with her about this. CPR given her chest wall would not be recommended or helpful to her in the event of cardiac arrest given her metastatic disease. DVT Prophylaxis: She has tolerated Lovenox in past- will start this today for DVT prophylaxis Family Communication: Patient has full capacity Disposition Plan: She will need either a very high level of in home care and support or an assisited living type facility for care- she is motivated to care for herself but has significant needs. Will ask PT OT to see her for mobility recs.  Total time: 50 minutes  Greater than 50%  of this time was spent counseling and coordinating care related to the above assessment and plan.  GMicheline Rough MD  Please contact Palliative Medicine Team phone at [phone] questions and concerns.

## 2021-10-08 DIAGNOSIS — C50411 Malignant neoplasm of upper-outer quadrant of right female breast: Secondary | ICD-10-CM | POA: Diagnosis not present

## 2021-10-08 DIAGNOSIS — Z170 Estrogen receptor positive status [ER+]: Secondary | ICD-10-CM | POA: Diagnosis not present

## 2021-10-08 DIAGNOSIS — G893 Neoplasm related pain (acute) (chronic): Secondary | ICD-10-CM | POA: Diagnosis not present

## 2021-10-08 DIAGNOSIS — E440 Moderate protein-calorie malnutrition: Secondary | ICD-10-CM

## 2021-10-08 LAB — CBC
HCT: 28.3 % — ABNORMAL LOW (ref 36.0–46.0)
Hemoglobin: 9.3 g/dL — ABNORMAL LOW (ref 12.0–15.0)
MCH: 30.4 pg (ref 26.0–34.0)
MCHC: 32.9 g/dL (ref 30.0–36.0)
MCV: 92.5 fL (ref 80.0–100.0)
Platelets: 351 10*3/uL (ref 150–400)
RBC: 3.06 MIL/uL — ABNORMAL LOW (ref 3.87–5.11)
RDW: 15.7 % — ABNORMAL HIGH (ref 11.5–15.5)
WBC: 5.9 10*3/uL (ref 4.0–10.5)
nRBC: 0 % (ref 0.0–0.2)

## 2021-10-08 LAB — BASIC METABOLIC PANEL
Anion gap: 5 (ref 5–15)
BUN: 9 mg/dL (ref 6–20)
CO2: 26 mmol/L (ref 22–32)
Calcium: 8.4 mg/dL — ABNORMAL LOW (ref 8.9–10.3)
Chloride: 105 mmol/L (ref 98–111)
Creatinine, Ser: 0.62 mg/dL (ref 0.44–1.00)
GFR, Estimated: >60 mL/min (ref >60)
Glucose, Bld: 105 mg/dL — ABNORMAL HIGH (ref 70–99)
Potassium: 3.8 mmol/L (ref 3.5–5.1)
Sodium: 136 mmol/L (ref 135–145)

## 2021-10-08 LAB — MAGNESIUM: Magnesium: 2.2 mg/dL (ref 1.7–2.4)

## 2021-10-08 NOTE — Progress Notes (Signed)
Pharmacy Antibiotic Note  Misty Arnold is a 50 y.o. female admitted on 10/03/2021 with recurrent wound infection.  Previous hospitalization from 08/25/21-09/05/21 for worsening R chest wall pain and infection.  PMH includes metastatic breast cancer (receiving Letrozole and Ibrance).  Pharmacy has been consulted for vancomycin and cefepime dosing for chest wall wound infection, fungating mass.   Today, 10/08/21 WBC WNL Afebrile SCr = 0.62. However, given paraplegia and that TBW < IBW, will round Scr to 1 for vancomycin dosing  Plan: Continue cefepime 2g IV q8 hours Continue vancomycin dose to 1000 mg IV q24h for estimated AUC of 504 Flagyl 500mg  IV q12 Follow renal function, culture data. Check vancomycin levels at steady state as needed  Height: 5\' 5"  (165.1 cm) Weight: 54.5 kg (120 lb 3.2 oz) IBW/kg (Calculated) : 57  Temp (24hrs), Avg:98.6 F (37 C), Min:98.1 F (36.7 C), Max:99 F (37.2 C)  Recent Labs  Lab 10/03/21 1407 10/04/21 0530 10/06/21 0608 10/07/21 1002 10/08/21 0547  WBC 7.9 6.3  --  4.7 5.9  CREATININE 0.84 0.68 0.68 0.59 0.62     Estimated Creatinine Clearance: 70.8 mL/min (by C-G formula based on SCr of 0.62 mg/dL).    Allergies  Allergen Reactions   Amoxicillin     Other reaction(s): rash/itching   Penicillins Rash    Did it involve swelling of the face/tongue/throat, SOB, or low BP? no Did it involve sudden or severe rash/hives, skin peeling, or any reaction on the inside of your mouth or nose? Yes Did you need to seek medical attention at a hospital or doctor's office? No When did it last happen? early 20's     If all above answers are "NO", may proceed with cephalosporin use.   Pork-Derived Products    Penicillin G    Tamoxifen Hypertension    Antimicrobials this admission: Vancomycin 11/29 >>  Cefepime 11/29 >>  Flagyl 11/29 >>  Dose adjustments this admission: 12/1: Vancomycin 1250 q24h >> 1000 mg IV q24h  Microbiology  results: 11/29 BCx: ngtd  Thank you for allowing pharmacy to be a part of this patient's care.  Minda Ditto, PharmD 10/08/21 2:10 PM

## 2021-10-08 NOTE — Progress Notes (Signed)
OT Cancellation Note  Patient Details Name: Misty Arnold MRN: [id number] DOB: 1970-12-29   Cancelled Treatment:    Reason Eval/Treat Not Completed: OT screened, no needs identified, will sign off. Patient reports no OT needs and that is at her baseline in regards to self care abilities.  [name] L Marianna Cid 10/08/2021, 8:34 AM

## 2021-10-08 NOTE — Progress Notes (Signed)
Daily Progress Note   Patient Name: Misty Arnold       Date: 10/08/2021 DOB: 05-30-1971  Age: 50 y.o. MRN#: [id number] Attending Physician: Misty Chain, DO Primary Care Physician: Misty Lose, MD Admit Date: 10/03/2021  Reason for Consultation/Follow-up: Pain control  Subjective:  I saw and examined Misty Arnold today.  Her significant other, Misty Arnold, was also at the bedside.  She continues to report feeling better than she did admission.  We reviewed plan for valuation by oncology and radiation oncology tomorrow.  She reports pain has been well controlled and denies any other needs or symptoms today.  Her only other concern is reported that she is peeing very frequently.  She reports having good intake and we discussed working to back down on the amount of IV fluid she is receiving.  Length of Stay: 5  Current Medications: Scheduled Meds:  . (feeding supplement) PROSource Plus  30 mL Oral TID BM  . enoxaparin (LOVENOX) injection  40 mg Subcutaneous Q24H  . oxyCODONE  15 mg Oral Q12H  . polyethylene glycol  17 g Oral Daily  . pregabalin  50 mg Oral QHS  . senna-docusate  1 tablet Oral QHS  . sodium chloride flush  3 mL Intravenous Q12H  . thiamine  100 mg Oral Daily    Continuous Infusions: . sodium chloride 75 mL/hr at 10/08/21 1523  . ceFEPime (MAXIPIME) IV 2 g (10/08/21 1314)  . metronidazole 500 mg (10/08/21 1524)  . vancomycin Stopped (10/07/21 2101)    PRN Meds: diazepam, lip balm, morphine injection  Physical Exam        General: Alert, awake, in no acute distress.   HEENT: No bruits, no goiter, no JVD Heart: Regular rate and rhythm. No murmur appreciated. Lungs: Good air movement, clear Abdomen: Soft, nontender, nondistended, positive bowel sounds.    Ext: No significant edema Skin: Warm and dry, chest wound not examined Neuro: Grossly intact, nonfocal.    Vital Signs: BP (!) 134/91 (BP Location: Right Arm)   Pulse (!) 109   Temp 99.5 F (37.5 C) (Oral)   Resp 17   Ht 5' 5"  (1.651 m)   Wt 54.5 kg   SpO2 96%   BMI 20.00 kg/m  SpO2: SpO2: 96 % O2 Device: O2 Device: Room Air O2 Flow Rate:  Intake/output summary:  Intake/Output Summary (Last 24 hours) at 10/08/2021 1930 Last data filed at 10/08/2021 0631 Gross per 24 hour  Intake 1600 ml  Output --  Net 1600 ml    LBM: Last BM Date: 10/04/21 Baseline Weight: Weight: 54.5 kg Most recent weight: Weight: 54.5 kg       Palliative Assessment/Data:      Patient Active Problem List   Diagnosis Date Noted  . Malnutrition of moderate degree 10/05/2021  . Breast wound 08/28/2021  . Infected wound 08/26/2021  . Essential hypertension 08/26/2021  . Wound infection 05/12/2021  . Cancer related pain 05/12/2021  . AKI (acute kidney injury) ([place]) 05/11/2021  . Palliative care patient 02/15/2020  . Myelopathy ([employer]) 01/08/2020  . Incomplete paraplegia ([place]) 01/08/2020  . Epidural mass   . Metastatic breast cancer ([place])   . Weakness of both legs   . Leucocytosis   . Acute blood loss anemia   . Neurogenic bladder   . Neurogenic bowel   . S/P laminectomy 01/04/2020  . 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 Breast cancer of upper-outer quadrant of right female breast ([place]) 11/02/2014    Palliative Care Assessment & Plan   Patient Profile: Misty Arnold is a 50 y.o. 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 She was seen in the outpatient cancer center palliative clinic today and was noted to have fever and possible wound infection. She  has been failing at home and unable to care for herself and doing her own wound care. She has progressive metastatic cancer on Ibrance.  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   1. Right mastectomy 08/29/2015 at [facility] (Misty Arnold): IDC with invol of skin and skin ulceration, breast capsule inv cancer, superior medial margin positive, 1/1 LN positive, grade 3, 14.3 cm, 3.5 cm, ALI, chest wall involv, ER 90-100%, PR 80-90%, HER-2 negative, Ki 67 40-50% T4CN1 (St 3B) 2. Patient started tamoxifen but developed severe headache with severe hypertension and tamoxifen was discontinued 03/17/2016.  3. 02/2017 started on Exemestane, Zoladex started 05/29/2017 4.  Hospitalization for paraplegia: T9 vertebral body compression status post decompressive laminectomy and tumor resection 01/04/2020 5.  Palliative radiation to the thoracic spine 01/28/2020 6.  Ibrance with letrozole started April 2021 discontinued 07/24/2021 7.  Enhertu started 07/31/2021 every 3 weeks   Assessment: Infection of Malignant Chest Wall Wound, Fungating Mass Signs of systemic infection with fever, tachycardia and worsening pain and wound drainage have now improved on broad spectrum abx Vanc, cefipime and flagyl. CT scan unfortunately confirms deeper disease and involvement of the bone structures of her chest wall and worsening ulcerations and overall interval progression of her cancer including a new liver lesion.  Continue IV antibiotics Plan for evaluation by radiation oncology on Monday Continue current drg changes She would like to start on Enhertu and is hoping she and her significant other can discuss  with Misty Arnold about this as soon as possible.   2. Cancer related pain Oxycontin 15 BID and use Morphine 50m q2prn for  breakthrough pain.  She has responded very well to this regimen- will continue.   3. Malnutrition, dehydration Low albumin, thin, clinically appears dry.  Reduced NS to 75 cc an hour. Appreciate Nutrition recs-she asking for a nutritional supplement that she got in rad onc-will try to provide for her if possible.  4. Hypokalemia Resolved.  Mag within normal limits.  5. Anemia Hgb stable.  Suspect acute drop was dilutional.  No signs of active bleeding.      Code Status: Full code will have ongoing conversations with her about this. CPR given her chest wall would not be recommended or helpful to her in the event of cardiac arrest given her metastatic disease. DVT Prophylaxis: She has tolerated Lovenox in past- will start this today for DVT prophylaxis Family Communication: Patient has full capacity Disposition Plan: She will need either a very high level of in home care and support or an assisited living type facility for care- she is motivated to care for herself but has significant needs. Will ask PT OT to see her for mobility recs.  Total time: 30 Greater than 50%  of this time was spent counseling and coordinating care related to the above assessment and plan.  Micheline Rough, MD  Please contact Palliative Medicine Team phone at [phone] for questions and concerns.

## 2021-10-08 NOTE — Evaluation (Addendum)
Physical Therapy Evaluation Patient Details Name: Misty Arnold MRN: [id number] DOB: 12/16/1970 Today's Date: 10/08/2021  History of Present Illness  Pt. is 50 yr old F admitted on 11/29 for CA related pain and infected R breast CA surgical wound. PMH: anemia, depression, HTN, breast CA x 10 yrs with mets to spine and ribs; and incomplete paraplegia 2* vertebral compression and now s/p tumor resection 3/21 with residual R LE deficits  Clinical Impression  Pt admitted as above and presenting with functional mobility limitations 2* mild ambulatory balance deficits and residual R LE deficits associated with spinal tumor resection 3/21.  Pt reports wc bound following surgery and progressed through RW, QC and now ambulating sans AD in home with use of quad cane outside.  Pt reports with repeated recent infections and hospitalizations she has noted increased R LE weakness and coordination deficits but with marked improvement since this admit 10/03/21.  Pt states "it feels like my toes just want to curl under" most notably when moving heel to toe with gait.  Pt then reports she has had HHPT and OPPT and described exercises PTs have shown her that have helped to alleviate these symptoms in past.  Pt also reports she uses wheelchair propulsion at home to increase UE and trunk strength and decrease back pain.  Pt encouraged to reinitiate therex program and wc provided for use while in hospital.  Pt referred to mobility team for ambulation.  PT to follow to monitor and to address higher level balance deficits      Recommendations for follow up therapy are one component of a multi-disciplinary discharge planning process, led by the attending physician.  Recommendations may be updated based on patient status, additional functional criteria and insurance authorization.  Follow Up Recommendations No PT follow up    Assistance Recommended at Discharge PRN  Functional Status Assessment Patient has had a recent  decline in their functional status and demonstrates the ability to make significant improvements in function in a reasonable and predictable amount of time.  Equipment Recommendations  None recommended by PT    Recommendations for Other Services       Precautions / Restrictions Precautions Precautions: Fall Restrictions Weight Bearing Restrictions: No      Mobility  Bed Mobility Overal bed mobility: Modified Independent                  Transfers Overall transfer level: Modified independent                      Ambulation/Gait Ambulation/Gait assistance: Modified independent (Device/Increase time) Gait Distance (Feet): 300 Feet Assistive device: Quad cane Gait Pattern/deviations: Step-through pattern;Shuffle;Wide base of support;Decreased step length - right Gait velocity: mod pace     General Gait Details: mild widening of BOS with noted increased shuffling step on R with increased distance ambulated.  Pt states it feels like her toes want to curl under ad she rolls heel to toe.  Stairs            Wheelchair Mobility    Modified Rankin (Stroke Patients Only)       Balance Overall balance assessment: Mild deficits observed, not formally tested                                           Pertinent Vitals/Pain Pain Assessment: 0-10 Pain Score: 2  Pain Location:  back Pain Descriptors / Indicators: Aching;Sore Pain Intervention(s): Limited activity within patient's tolerance;Monitored during session;Premedicated before session    [place] expects to be discharged to:: Private residence Living Arrangements: Non-relatives/Friends Available Help at Discharge: Family Type of Home: House Home Access: Stairs to enter Entrance Stairs-Rails: None Entrance Stairs-Number of Steps: 2   Home Layout: One level Home Equipment: Real (2 wheels);Wheelchair - manual      Prior Function Prior  Level of Function : Independent/Modified Independent             Mobility Comments: Uses [facility] for community amb, no AD in home ADLs Comments: Independent     Hand Dominance   Dominant Hand: Right    Extremity/Trunk Assessment   Upper Extremity Assessment Upper Extremity Assessment: Overall WFL for tasks assessed    Lower Extremity Assessment Lower Extremity Assessment: RLE deficits/detail RLE Deficits / Details: Strength 4+/5 knee and ankle and 4/5 hip but with noted mild decreased coordination RLE Coordination: decreased fine motor    Cervical / Trunk Assessment Cervical / Trunk Assessment: Normal  Communication   Communication: No difficulties  Cognition Arousal/Alertness: Awake/alert Behavior During Therapy: WFL for tasks assessed/performed Overall Cognitive Status: Within Functional Limits for tasks assessed                                          General Comments      Exercises     Assessment/Plan    PT Assessment Patient needs continued PT services  PT Problem List Decreased strength;Pain;Decreased coordination;Decreased balance       PT Treatment Interventions DME instruction;Gait training;Stair training;Functional mobility training;Therapeutic activities;Therapeutic exercise;Balance training    PT Goals (Current goals can be found in the Care Plan section)  Acute Rehab PT Goals Patient Stated Goal: Regain previous level of mobility PT Goal Formulation: With patient Time For Goal Achievement: 10/22/21 Potential to Achieve Goals: Good    Frequency Min 3X/week   Barriers to discharge        Co-evaluation               AM-PAC PT "6 Clicks" Mobility  Outcome Measure Help needed turning from your back to your side while in a flat bed without using bedrails?: None Help needed moving from lying on your back to sitting on the side of a flat bed without using bedrails?: None Help needed moving to and from a bed to a chair  (including a wheelchair)?: None Help needed standing up from a chair using your arms (e.g., wheelchair or bedside chair)?: None Help needed to walk in hospital room?: A Little Help needed climbing 3-5 steps with a railing? : A Little 6 Click Score: 22    End of Session Equipment Utilized During Treatment: Gait belt Activity Tolerance: Patient tolerated treatment well Patient left: in chair;with call bell/phone within reach;with family/visitor present Nurse Communication: Mobility status PT Visit Diagnosis: Other symptoms and signs involving the nervous system (R29.898)    Time: 2119-4174 PT Time Calculation (min) (ACUTE ONLY): 37 min   Charges:   PT Evaluation $PT Eval Low Complexity: 1 Low          Debe Coder PT Acute Rehabilitation Services Pager [phone] Office [phone]   Chika Cichowski 10/08/2021, 5:25 PM

## 2021-10-09 ENCOUNTER — Encounter: Payer: Self-pay | Admitting: *Deleted

## 2021-10-09 ENCOUNTER — Ambulatory Visit
Admit: 2021-10-09 | Discharge: 2021-10-09 | Disposition: A | Discharge status: Home / Self Care | Payer: Medicare PPO | Attending: Radiation Oncology | Admitting: Radiation Oncology

## 2021-10-09 ENCOUNTER — Other Ambulatory Visit: Payer: Self-pay | Admitting: Hematology and Oncology

## 2021-10-09 ENCOUNTER — Telehealth: Payer: Self-pay

## 2021-10-09 DIAGNOSIS — C50919 Malignant neoplasm of unspecified site of unspecified female breast: Secondary | ICD-10-CM

## 2021-10-09 DIAGNOSIS — C50411 Malignant neoplasm of upper-outer quadrant of right female breast: Secondary | ICD-10-CM

## 2021-10-09 DIAGNOSIS — Z515 Encounter for palliative care: Secondary | ICD-10-CM | POA: Diagnosis not present

## 2021-10-09 DIAGNOSIS — G893 Neoplasm related pain (acute) (chronic): Secondary | ICD-10-CM | POA: Diagnosis not present

## 2021-10-09 DIAGNOSIS — Z170 Estrogen receptor positive status [ER+]: Secondary | ICD-10-CM

## 2021-10-09 DIAGNOSIS — C7949 Secondary malignant neoplasm of other parts of nervous system: Secondary | ICD-10-CM

## 2021-10-09 DIAGNOSIS — C773 Secondary and unspecified malignant neoplasm of axilla and upper limb lymph nodes: Secondary | ICD-10-CM | POA: Diagnosis not present

## 2021-10-09 LAB — CULTURE, BLOOD (ROUTINE X 2)
Culture: NO GROWTH
Culture: NO GROWTH
Special Requests: ADEQUATE
Special Requests: ADEQUATE

## 2021-10-09 MED ORDER — LIDOCAINE-PRILOCAINE 2.5-2.5 % EX CREA: TOPICAL_CREAM | CUTANEOUS | 3 refills | Status: DC

## 2021-10-09 MED ORDER — VITAMIN D 25 MCG (1000 UNIT) PO TABS
5000.0000 [IU] | ORAL_TABLET | Freq: Two times a day (BID) | ORAL | Status: DC
  Administered 2021-10-09 – 2021-10-12 (×7): 5000 [IU] via ORAL
  Filled 2021-10-09 (×7): qty 5

## 2021-10-09 MED ORDER — ONDANSETRON HCL 8 MG PO TABS: 8.0000 mg | ORAL_TABLET | Freq: Two times a day (BID) | ORAL | 1 refills | Status: DC | PRN

## 2021-10-09 MED ORDER — PROCHLORPERAZINE MALEATE 10 MG PO TABS: 10.0000 mg | ORAL_TABLET | Freq: Four times a day (QID) | ORAL | 1 refills | Status: DC | PRN

## 2021-10-09 NOTE — Progress Notes (Signed)
Radiation Oncology         [phone]) [phone] ________________________________  Inpatient Re-Consultation  Name: Misty Arnold MRN: [id number]  Date: 10/09/2021  DOB: [birth year]  [id number], Loleta Dicker, MD  Acquanetta Chain, DO   REFERRING PHYSICIAN: Acquanetta Chain, DO  DIAGNOSIS: The encounter diagnosis was Metastatic breast cancer ([place]).  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   Metastatic poorly differentiated carcinoma   Stage IIIB (T4, cN1) Right Breast, Invasive Ductal Carcinoma, ER+ / PR +, Her-2 negative, Grade 3 (October of 2016)   Right chest wall nodule positive for metastatic carcinoma (March of 2013)   Stage IB (T1, N1, MIC) Right Breast UOQ, Invasive Ductal Carcinoma, ER+ / PR+ / Her2-, Grade 2 (November of 2011)  Interval since last radiation: 1 year, 7 months, and 23 days  Radiation treatment dates: 01/28/2020-02/15/2020  Site: thoracic spine Total fractions: 14/14 Fx Total dose: 35/35 Gy Gy/Fx: 2.5 Technique: 3D Mode/Energy: 10x  HISTORY OF PRESENT ILLNESS::Misty Arnold is a 50 y.o. female who is seen as a courtesy of Dr. Hilma Favors for re-evaluation and an opinion concerning radiation therapy as part of management for her diagnosed metastatic breast cancer, with recent progression of disease.  The patient was last seen by me for radiation treatment in April of 2021 directed at the thoracic spine.   The patient presented for routine follow-up at the outpatient cancer center palliative clinic on 10/03/21 and was noted to have fever and a possible wound infection of a malignant chest wall wound. Accordingly, the patient was instructed to present to the ED for admission and further evaluation. Upon hospital evaluation, the patient was found to display signs of systemic infection including fever, tachycardia, worsening pain, and wound drainage. Patient was started on broad spectrum vancomycin, cefipime and flagyl. Due to cancer related  pain, the patient was started on oxycontin and Morphine for breakthrough pain. (She was also started lyrica QHS).   Chest CT with contrast on 10/05/21 revealed an increase in size of the large irregular chest wall mass compared to prior imaging. New areas of ulceration and increased extension into the anterior right chest wall, and osseous destruction of the anterior right 4th-6th ribs were appreciated as well. Expansile osseous right rib and sternal lesions with bulky soft tissue components were also seen to have increased in size compared to prior exam, as well as increased axillary and supraclavicular adenopathy, and increased size of left breast mass. Additionally, a large lytic lesion involving the posterior aspect of vertebral bodies of T7-T9 displayed an increase in size compared to prior imaging. A new heterogenous lesion of the left hepatic lobe was appreciated as well, compatible with hepatic metastatic disease.  Head CT without contrast on 10/05/21 showed no evidence of intracranial metastatic disease.    Interval History since last visit in April of 2021  Patient underwent biopsy of right axillary lymph node on 06/15/21 which revealed metastatic mammary carcinoma  She maintained close follow up with Dr. Lindi Adie and was started on Ibrance with letrozole in April of 2021 which was discontinued on 07/24/21.  Enhertu was started 07/31/2021 every 3 weeks. Patient however decided to discontinue Enhertu on 10/03/21 and continued on Zoladex injections for ovarian function suppression along with letrozole and Ibrance.  Patient was also admitted on 08/25/2021-09/05/2021 with worsening right chest wall pain and infection.   Pertinent imaging since the patient was last seen includes:  --CT of the chest abdomen and pelvis on 08/29/2020 which revealed progression of disease defined by  enlargement of the primary chest wall tumor, enlarging contralateral lesions in the left breast, bilateral axillary and  subpectoral lymphadenopathy, and multiple osseous lesions in the thorax. New metastatic disease was also noted in the abdomen and pelvis. --CT of the chest abdomen and pelvis on 03/16/21 re-demonstrated the bulky mass centered about the right breast, as well as multiple metastatic lymph nodes and soft tissue lesions about the chest wall; noted as enlarged compared to prior CT. Multiple expansile, mixed lytic and sclerotic osseous lesions, including some with bulky soft tissue components, were also seen as enlarged compared to prior examination. Findings were noted as overall consistent with worsened metastatic disease. Also re-demonstrated was thoracic bridging fusion about a large lytic lesion of the posterior aspect of the T8 and T9 vertebral bodies. No evidence of distant metastatic disease in the abdomen or pelvis otherwise appreciated.  --CTA of the chest on 05/11/76 (prompted due to suspicion of pulmonary embolus) revealed no evidence of pulmonary embolus. CT did however show a sequela of metastatic breast cancer including bilateral soft tissue breast masses, (greater on the right),  axillary and supraclavicular lymphadenopathy, and multiple bony metastases.   PAST MEDICAL HISTORY:  Past Medical History:  Diagnosis Date   Abnormal uterine bleeding    Anemia    Breast cancer ([address])    Depression    Fibroid    Hormone disorder    Hypertension    Infertility, female     PAST SURGICAL HISTORY: Past Surgical History:  Procedure Laterality Date   ABDOMINAL HYSTERECTOMY     BREAST SURGERY     LAMINECTOMY N/A 01/04/2020   Procedure: Thoracic Eight, Thoracic Nine THORACIC LAMINECTOMY FOR TUMOR with Thoracic Seven-Thoracic Ten instrumentation;  Surgeon: [name], MD;  Location: [facility];  Service: Neurosurgery;  Laterality: N/A;  Thoracic Eight, Thoracic Nine THORACIC LAMINECTOMY FOR TUMOR with Thoracic Seven-Thoracic Ten instrumentation   MASTECTOMY     salivary gland stone      FAMILY  HISTORY:  Family History  Problem Relation Age of Onset   Stroke Mother    Heart attack Mother    Diabetes Mother    Hyperlipidemia Mother    Heart disease Mother    Diabetes Father    Stroke Father    Stroke Maternal Grandmother    Cancer Maternal Grandfather    Colon cancer Maternal Grandfather    Stroke Maternal Grandfather    Stroke Paternal Grandmother    Heart attack Paternal Grandmother    Heart failure Paternal Grandmother    Diabetes Paternal Grandmother    Hyperlipidemia Paternal Grandmother    CAD Paternal Grandmother    Cancer Paternal Grandfather    Colon cancer Paternal Grandfather    Cancer Maternal Aunt 27       breast   Stroke Maternal Aunt    Heart attack Maternal Aunt    Breast cancer Maternal Aunt     SOCIAL HISTORY:  Social History   Tobacco Use   Smoking status: Never   Smokeless tobacco: Never  Vaping Use   Vaping Use: Never used  Substance Use Topics   Alcohol use: Not Currently   Drug use: No    ALLERGIES:  Allergies  Allergen Reactions   Amoxicillin     Other reaction(s): rash/itching   Penicillins Rash    Did it involve swelling of the face/tongue/throat, SOB, or low BP? no Did it involve sudden or severe rash/hives, skin peeling, or any reaction on the inside of your mouth or nose? Yes Did  you need to seek medical attention at a hospital or doctor's office? No When did it last happen? early 20's     If all above answers are "NO", may proceed with cephalosporin use.   Pork-Derived Products    Penicillin G    Tamoxifen Hypertension    MEDICATIONS:  No current facility-administered medications for this encounter.   No current outpatient medications on file.   Facility-Administered Medications Ordered in Other Encounters  Medication Dose Route Frequency Provider Last Rate Last Admin   (feeding supplement) PROSource Plus liquid 30 mL  30 mL Oral TID BM Lane Hacker L, DO   30 mL at 10/08/21 2029   0.9 %  sodium chloride  infusion   Intravenous Continuous Micheline Rough, MD 75 mL/hr at 10/09/21 0024 Restarted at 10/09/21 0024   ceFEPIme (MAXIPIME) 2 g in sodium chloride 0.9 % 100 mL IVPB  2 g Intravenous Q8H Dimple Nanas, RPH 200 mL/hr at 10/09/21 0555 2 g at 10/09/21 0555   diazepam (VALIUM) tablet 5 mg  5 mg Oral Q6H PRN Lane Hacker L, DO   5 mg at 10/07/21 0219   enoxaparin (LOVENOX) injection 40 mg  40 mg Subcutaneous Q24H Lane Hacker L, DO   40 mg at 10/08/21 1501   lip balm (CARMEX) ointment 1 application  1 application Topical PRN Acquanetta Chain, DO       metroNIDAZOLE (FLAGYL) IVPB 500 mg  500 mg Intravenous Q12H Lane Hacker L, DO 100 mL/hr at 10/09/21 0405 500 mg at 10/09/21 0405   morphine 4 MG/ML injection 4 mg  4 mg Intravenous Q2H PRN Lane Hacker L, DO   4 mg at 10/09/21 0439   oxyCODONE (OXYCONTIN) 12 hr tablet 15 mg  15 mg Oral Q12H Golding, Elizabeth L, DO   15 mg at 10/08/21 2144   polyethylene glycol (MIRALAX / GLYCOLAX) packet 17 g  17 g Oral Daily Pickenpack-Cousar, Athena N, NP   17 g at 10/08/21 1111   pregabalin (LYRICA) capsule 50 mg  50 mg Oral QHS Lane Hacker L, DO   50 mg at 10/08/21 2144   senna-docusate (Senokot-S) tablet 1 tablet  1 tablet Oral QHS Lane Hacker L, DO   1 tablet at 10/08/21 2144   sodium chloride flush (NS) 0.9 % injection 3 mL  3 mL Intravenous Q12H Lane Hacker L, DO   3 mL at 10/08/21 2146   thiamine tablet 100 mg  100 mg Oral Daily Lenis Noon, RPH   100 mg at 10/08/21 1110   vancomycin (VANCOREADY) IVPB 1000 mg/200 mL  1,000 mg Intravenous Q24H Lenis Noon, [facility]   Stopped at 10/08/21 2131    REVIEW OF SYSTEMS:  A 10+ POINT REVIEW OF SYSTEMS WAS OBTAINED including neurology, dermatology, psychiatry, cardiac, respiratory, lymph, extremities, GI, GU, musculoskeletal, constitutional, reproductive, HEENT.  Reports some pain along the right chest area but this is reasonably controlled with pain medications at  the current time.  She does report drainage from the right chest wall area.  In addition when dressings were removed there were multiple areas of bleeding noted.   PHYSICAL EXAM:  General: Alert and oriented, in no acute distress, lying in her hospital bed HEENT: Head is normocephalic. Extraocular movements are intact. Oropharynx is clear. Neck: Neck is supple, no palpable cervical or supraclavicular lymphadenopathy. Heart: Regular in rate and rhythm with no murmurs, rubs, or gallops. Chest: Clear to auscultation bilaterally, with no rhonchi, wheezes, or rales. Abdomen: Soft, nontender, nondistended,  with no rigidity or guarding. Extremities: No cyanosis or edema. Lymphatics: see Neck Exam Skin: No concerning lesions. Musculoskeletal: symmetric strength and muscle tone throughout. Neurologic: Cranial nerves II through XII are grossly intact. No obvious focalities. Speech is fluent. Coordination is intact. Psychiatric: Judgment and insight are intact. Affect is appropriate. She reports being able to ambulate on her own without the assistance of a walker or cane denies any bowel or bladder incontinence or retention. The right chest wall has extensive recurrence with large tumor nodules which shows signs of drainage and bleeding.  She in addition has significant adenopathy in the right axillary region. And dressings were removed from the right chest wall area these tumors nodules showed oozing and bleeding.   ECOG = 3  0 - Asymptomatic (Fully active, able to carry on all predisease activities without restriction)  1 - Symptomatic but completely ambulatory (Restricted in physically strenuous activity but ambulatory and able to carry out work of a light or sedentary nature. For example, light housework, office work)  2 - Symptomatic, <50% in bed during the day (Ambulatory and capable of all self care but unable to carry out any work activities. Up and about more than 50% of waking hours)  3 -  Symptomatic, >50% in bed, but not bedbound (Capable of only limited self-care, confined to bed or chair 50% or more of waking hours)  4 - Bedbound (Completely disabled. Cannot carry on any self-care. Totally confined to bed or chair)  5 - Death   Eustace Pen MM, Creech RH, Tormey DC, et al. [phone]). "Toxicity and response criteria of the [facility]". Onward Oncol. 5 (6): 649-55  LABORATORY DATA:  Lab Results  Component Value Date   WBC 5.9 10/08/2021   HGB 9.3 (L) 10/08/2021   HCT 28.3 (L) 10/08/2021   MCV 92.5 10/08/2021   PLT 351 10/08/2021   NEUTROABS 6.2 10/03/2021   Lab Results  Component Value Date   NA 136 10/08/2021   K 3.8 10/08/2021   CL 105 10/08/2021   CO2 26 10/08/2021   GLUCOSE 105 (H) 10/08/2021   CREATININE 0.62 10/08/2021   CALCIUM 8.4 (L) 10/08/2021      RADIOGRAPHY: CT HEAD WO CONTRAST (5MM)  Result Date: 10/05/2021 CLINICAL DATA:  Breast cancer EXAM: CT HEAD WITHOUT CONTRAST TECHNIQUE: Contiguous axial images were obtained from the base of the skull through the vertex without intravenous contrast. COMPARISON:  Brain MRI 02/20/2018 FINDINGS: Brain: There is no evidence of acute intracranial hemorrhage, extra-axial fluid collection, or acute infarct. The parenchymal volume is normal. The ventricles are normal in size. There is no evidence of solid mass lesion on this noncontrast CT. There is no midline shift. Vascular: No hyperdense vessel or unexpected calcification. Skull: Normal. Negative for fracture or focal lesion. Sinuses/Orbits: The imaged paranasal sinuses are clear. The globes and orbits are unremarkable. Other: None. IMPRESSION: No evidence of intracranial metastatic disease on this noncontrast head CT. Note that if there is clinical concern for metastatic disease, MR of the head with and without contrast is recommended. Electronically Signed   By: Valetta Mole M.D.   On: 10/05/2021 13:57   CT CHEST W CONTRAST  Result Date:  10/05/2021 CLINICAL DATA:  Large irregular multi EXAM: CT CHEST WITH CONTRAST TECHNIQUE: Multidetector CT imaging of the chest was performed during intravenous contrast administration. CONTRAST:  52m OMNIPAQUE IOHEXOL 350 MG/ML SOLN COMPARISON:  CT chest, abdomen and pelvis dated Mar 16, 2021 FINDINGS: Cardiovascular: Normal heart size.  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 No pericardial effusion. No significant coronary artery calcifications or atherosclerotic disease of the thoracic aorta. No suspicious central filling defects of the pulmonary arteries. Mediastinum/Nodes: Esophagus and thyroid are unremarkable. Enlarged bilateral axillary lymph nodes and right supraclavicular lymph nodes, increased in size when compared with prior exam. Reference left axillary lymph node measuring 4.0 by 2.7 cm on series 2, image 39, previously 3.6 x 1.6 cm. Lungs/Pleura: Central airways are patent. Small loculated right pleural effusion with associated atelectasis, new compared to prior exam. No suspicious pulmonary nodules. Upper Abdomen: Heterogeneous enhancing lesion of the left lobe of the liver measuring approximately 3.1 x 1.8 cm on series 2, image 143, new compared to prior exam. Musculoskeletal: Large irregular right chest wall mass, largest component measures up to 10.1 by 5.9 cm, previously 9.3 x 5.0 cm. New areas of ulceration are seen. New osseous destruction of the adjacent 4th-6th ribs with invasion into the anterior right chest wall. Expansile osseous right rib and sternal lesions with bulky soft tissue components are increased in size compared to prior exam. Reference lesion of the posterolateral ninth rib measuring 3.4 x 2.7 cm on series 2, image 135, previously measured 2.9 x 1.8 cm. Soft tissue lesion of the left breast located on image 77 measuring 4.2 x 2.8 cm, previously 3.4 x 2.7 cm. Posterior fusion of T5-T10. Large lytic lesion involving the posterior aspect of vertebral bodies of T7-T9 is  increased in size when compared to the prior exam. IMPRESSION: 1. Large irregular right chest wall mass is increased in size when compared with prior exam. There are new areas of ulceration and increased extension into the anterior right chest wall and osseous destruction of the anterior right 4th-6th ribs. 2. Expansile osseous right rib and sternal lesions with bulky soft tissue components are increased in size compared to prior exam. 3. New loculated right pleural effusion. 4. Increased axillary and supraclavicular adenopathy and increased size of left breast mass. 5. Posterior fusion of T5-T10. Large lytic lesion involving the posterior aspect of vertebral bodies of T7-T9 is increased in size when compared to the prior exam. Evaluation of the spinal canal is limited due to streak artifact. 6. New heterogeneous lesion of the left hepatic lobe, compatible with hepatic metastatic disease. Electronically Signed   By: Yetta Glassman M.D.   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  We outlined the significance of recurrence along the right chest wall area.  Patient has been doing reading on her own and feels she may be a candidate for SRS/SBRT for her lesions along the right chest wall area.  She is requested referral of her information to [facility] for further evaluation of this potential treatment.  I discussed with the patient that given the extensive recurrence along the right chest wall area based on our experience with SBRT/SRS this would not be a situation for this type of treatment.  She is concerned with conventional treatment that a lot of her right lung might be treated with radiation therapy.  I discussed with her that may be a large electron field encompassing most of this disease might give her some tumor control and help to reduce some of the drainage  and bleeding with limited involvement of the underlying lung.  At this time the patient does not wish to set  up any treatment while here in the hospital.  After she has received recommendations from [place] she will then make her decision concerning potential palliative radiation therapy here in [place]    45 minutes of total time was spent for this patient encounter, including preparation, face-to-face counseling with the patient and coordination of care, physical exam, and documentation of the encounter.   ------------------------------------------------  Blair Promise, PhD, MD  This document serves as a record of services personally performed by Gery Pray, MD. It was created on his behalf by Roney Mans, a trained medical scribe. The creation of this record is based on the scribe's personal observations and the provider's statements to them. This document has been checked and approved by the attending provider.

## 2021-10-09 NOTE — Progress Notes (Signed)
   10/09/21 2208  Assess: MEWS Score  Temp 99.5 F (37.5 C)  BP (!) 166/83  Pulse Rate (!) 116  Resp 17  SpO2 95 %  Assess: MEWS Score  MEWS Temp 0  MEWS Systolic 0  MEWS Pulse 2  MEWS RR 0  MEWS LOC 0  MEWS Score 2  MEWS Score Color Yellow  Assess: if the MEWS score is Yellow or Red  Were vital signs taken at a resting state? Yes  Focused Assessment No change from prior assessment  Does the patient meet 2 or more of the SIRS criteria? Yes  Does the patient have a confirmed or suspected source of infection? Yes  Provider and Rapid Response Notified? No  MEWS guidelines implemented *See Row Information* Yes  Take Vital Signs  Increase Vital Sign Frequency  Yellow: Q 2hr X 2 then Q 4hr X 2, if remains yellow, continue Q 4hrs  Escalate  MEWS: Escalate Yellow: discuss with charge nurse/RN and consider discussing with provider and RRT  Notify: Charge Nurse/RN  Name of Charge Nurse/RN Notified Stephanie, RN  Date Charge Nurse/RN Notified 10/09/21  Time Charge Nurse/RN Notified 2215  Assess: SIRS CRITERIA  SIRS Temperature  0  SIRS Pulse 1  SIRS Respirations  0  SIRS WBC 0  SIRS Score Sum  1

## 2021-10-09 NOTE — Progress Notes (Signed)
Per pt request RN successfully faxed recent office notes, labs, pathology reports, and scan results to [facility] [phone]).  Pt states she is interested in undergoing stereotatic breast radiation therapy with [facility].

## 2021-10-09 NOTE — Progress Notes (Signed)
Daily Progress Note   Patient Name: Misty Arnold       Date: 10/09/2021 DOB: [birth year]  Age: 50 y.o. MRN#: [id number] Attending Physician: Acquanetta Chain, DO Primary Care Physician: Nicholas Lose, MD Admit Date: 10/03/2021  Reason for Consultation/Follow-up: Pain control  Subjective:  Chart reviewed and patient examined at bedside. [name] is sitting upright in bed eating lunch. No acute distress noted. States her pain is well controlled which she is appreciative of. No family at the bedside.   Appetite improved. She was seen by Dr. Sondra Come and Dr. Lindi Adie on today. [name] is now agreeable to starting systemic therapies with plans to initiate Enhertu on next week. She states she previously was not interested in treatment however she knows in order to continue to have some quality of life and to live as long as possible she will need to proceed.   She reports possible plans to go to [facility] for evaluation of stereotactic breast radiotherapy. Says her brother and his wife lives in the [place] area and she is hopeful they will accommodate her for what time she will need in that area although she also speaks of the possibility to assisted living at discharge.   [name] speaks of her appreciation of all the great support she has received since hospitalization.   All questions answered and support provided.   Length of Stay: 6  Current Medications: Scheduled Meds:  . (feeding supplement) PROSource Plus  30 mL Oral TID BM  . cholecalciferol  5,000 Units Oral BID  . enoxaparin (LOVENOX) injection  40 mg Subcutaneous Q24H  . oxyCODONE  15 mg Oral Q12H  . polyethylene glycol  17 g Oral Daily  . pregabalin  50 mg Oral QHS  . senna-docusate  1 tablet Oral QHS  . sodium chloride  flush  3 mL Intravenous Q12H  . thiamine  100 mg Oral Daily    Continuous Infusions: . sodium chloride 75 mL/hr at 10/09/21 0024  . ceFEPime (MAXIPIME) IV 2 g (10/09/21 1422)  . metronidazole 500 mg (10/09/21 0405)  . vancomycin Stopped (10/08/21 2131)    PRN Meds: diazepam, lip balm, morphine injection  Physical Exam        General: No acute distress   Heart: RRR Lungs: Good air movement, clear Abdomen: Soft, nontender, nondistended, positive bowel sounds.  Ext: No significant edema Skin: Warm and dry, chest wound not examined, dressing dry and intact Neuro: Grossly intact, nonfocal.AAO x3     Vital Signs: BP (!) 177/100 (BP Location: Left Leg)   Pulse (!) 109   Temp 98.6 F (37 C) (Oral)   Resp 18   Ht 5' 5"  (1.651 m)   Wt 54.5 kg   SpO2 98%   BMI 20.00 kg/m  SpO2: SpO2: 98 % O2 Device: O2 Device: Room Air O2 Flow Rate:    Intake/output summary:  Intake/Output Summary (Last 24 hours) at 10/09/2021 1606 Last data filed at 10/09/2021 1038 Gross per 24 hour  Intake 2838.47 ml  Output --  Net 2838.47 ml    LBM: Last BM Date: 10/08/21 Baseline Weight: Weight: 54.5 kg Most recent weight: Weight: 54.5 kg       Palliative Assessment/Data:      Patient Active Problem List   Diagnosis Date Noted  . Malnutrition of moderate degree 10/05/2021  . Breast wound 08/28/2021  . Infected wound 08/26/2021  . Essential hypertension 08/26/2021  . Wound infection 05/12/2021  . Cancer related pain 05/12/2021  . AKI (acute kidney injury) ([place]) 05/11/2021  . Palliative care patient 02/15/2020  . Myelopathy ([place]) 01/08/2020  . Incomplete paraplegia ([place]) 01/08/2020  . Epidural mass   . Metastatic breast cancer ([place])   . Weakness of both legs   . Leucocytosis   . Acute blood loss anemia   . Neurogenic bladder   . Neurogenic bowel   . S/P laminectomy 01/04/2020  . 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 Breast cancer of upper-outer quadrant of  right female breast ([place]) 11/02/2014    Palliative Care Assessment & Plan   Patient Profile: Misty Arnold is a 50 y.o. 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 She was seen in the outpatient cancer center palliative clinic today and was noted to have fever and possible wound infection. She has been failing at home and unable to care for herself and doing her own wound care. She has progressive metastatic cancer on Ibrance.  Cancer history (from Dr. 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   1. Right mastectomy 08/29/2015 at [facility] (Dr.Singh): IDC with invol of skin and skin ulceration, breast capsule inv cancer, superior medial margin positive, 1/1 LN positive, grade 3, 14.3 cm, 3.5 cm, ALI, chest wall involv, ER 90-100%, PR 80-90%, HER-2 negative, Ki 67 40-50% T4CN1 (St 3B) 2. Patient started tamoxifen but developed severe headache with severe hypertension and tamoxifen was discontinued 03/17/2016.  3. 02/2017 started on Exemestane, Zoladex started 05/29/2017 4.  Hospitalization for paraplegia: T9 vertebral body compression status post decompressive laminectomy and tumor resection 01/04/2020 5.  Palliative radiation to the thoracic spine 01/28/2020 6.  Ibrance with letrozole started April 2021 discontinued 07/24/2021 7.  Enhertu started 07/31/2021 every 3 weeks   Assessment: Infection of Malignant Chest Wall Wound, Fungating Mass Signs of systemic infection with fever,  tachycardia and worsening pain and wound drainage have now improved on broad spectrum abx Vanc, cefipime and  flagyl. CT scan unfortunately confirms deeper disease and involvement of the bone structures of her chest wall and worsening ulcerations and overall interval progression of her cancer including a new liver lesion.  Continue IV antibiotics Plan for evaluation by radiation oncology on Monday Continue current drg changes She would like to start on Enhertu and is hoping she and her significant other can discuss with Dr. Lindi Adie about this as soon as possible.   2. Cancer related pain Oxycontin 15 BID and use Morphine 27m q2prn for breakthrough pain.  She has responded very well to this regimen- will continue.   3. Malnutrition, dehydration Low albumin, thin, clinically appears dry.  Reduced NS to 75 cc an hour. Appreciate Nutrition recs-she asking for a nutritional supplement that she got in rad onc-will try to provide for her if possible.  4. Hypokalemia Resolved.  Mag within normal limits.  5. Anemia Hgb stable.  Suspect acute drop was dilutional.  No signs of active bleeding.      Code Status: Full code will have ongoing conversations with her about this. CPR given her chest wall would not be recommended or helpful to her in the event of cardiac arrest given her metastatic disease. DVT Prophylaxis: She has tolerated Lovenox in past- will start this today for DVT prophylaxis Family Communication: Patient has full capacity Disposition Plan: She will need either a very high level of in home care and support or an assisited living type facility for care- she is motivated to care for herself but has significant needs. Will ask PT OT to see her for mobility recs.  Total time: 30 Greater than 50%  of this time was spent counseling and coordinating care related to the above assessment and plan.  AJobe Gibbon NP  Please contact Palliative Medicine Team phone at [phone]  questions and concerns.

## 2021-10-09 NOTE — Progress Notes (Signed)
[place] Work  Clinical Social Work received referral for ALF placement.  Patient is currently inpatient at [facility].  CSW team as communicated with inpatient staff regarding referral.  Inpatient team will assess placement needs.  Johnnye Lana, MSW, LCSW, OSW-C Clinical Social Worker [facility] [phone]

## 2021-10-09 NOTE — Progress Notes (Signed)
Discussed with the patient about systemic treatment options because of progression of disease.  Previously recommended Enhertu and I still recommend the same medication at this time.  She is now agreeable and will is willing to receive it. We will plan to administer Enhertu starting next week assuming that she gets discharged from the hospital.

## 2021-10-09 NOTE — Progress Notes (Signed)
Chaplain tried to engage in an initial visit with Misty Arnold but she was using the bathroom at the time.  Chaplain will follow-up.     10/09/21 1400  Clinical Encounter Type  Visited With Patient not available  Visit Type Initial

## 2021-10-09 NOTE — Telephone Encounter (Signed)
Return pt call, left VM for pt stating wound care nurse should have comparable supplies while inpatient.  Call us back with further questions if needed.

## 2021-10-09 NOTE — Progress Notes (Signed)
OFF PATHWAY REGIMEN - Breast  No Change  Continue With Treatment as Ordered.  Original Decision Date/Time: 07/24/2021 16:02   [id number]:Fam-trastuzumab deruxtecan-nxki 5.4 mg/kg IV D1 q21 Days:   A cycle is every 21 days:     Fam-trastuzumab deruxtecan-nxki   **Always confirm dose/schedule in your pharmacy ordering system**  Patient Characteristics: Distant Metastases or Locoregional Recurrent Disease - Unresected or Locally Advanced Unresectable Disease Progressing after Neoadjuvant and Local Therapies, HER2 Low/Negative/Unknown, ER Positive, Chemotherapy, HER2 Negative/Unknown, Third Line and  Beyond, Prior or Contraindicated Anthracycline and Prior Eribulin Therapeutic Status: Distant Metastases HER2 Status: Negative (-) ER Status: Positive (+) PR Status: Positive (+) Therapy Approach Indicated: Standard Chemotherapy/Endocrine Therapy Line of Therapy: Third Line and Beyond Intent of Therapy: Non-Curative / Palliative Intent, Discussed with Patient

## 2021-10-10 ENCOUNTER — Inpatient Hospital Stay: Payer: Self-pay

## 2021-10-10 LAB — VANCOMYCIN, PEAK: Vancomycin Pk: 32 ug/mL (ref 30–40)

## 2021-10-10 LAB — VANCOMYCIN, TROUGH: Vancomycin Tr: <4 ug/mL — ABNORMAL LOW (ref 15–20)

## 2021-10-10 LAB — CREATININE, SERUM
Creatinine, Ser: 0.67 mg/dL (ref 0.44–1.00)
GFR, Estimated: >60 mL/min (ref >60)

## 2021-10-10 MED ORDER — VANCOMYCIN HCL 750 MG/150ML IV SOLN
750.0000 mg | Freq: Two times a day (BID) | INTRAVENOUS | Status: DC
  Administered 2021-10-11: 750 mg via INTRAVENOUS
  Filled 2021-10-10: qty 150

## 2021-10-10 NOTE — Progress Notes (Addendum)
Chaplain engaged in an initial visit with Misty Arnold.  Misty Arnold shared about her healthcare journey, work history and current needs.  Misty Arnold voiced that she was diagnosed with cancer in 2011.  Since that diagnosis, she completely changed her lifestyle to eat more natural and organic foods, as well as have more holistic practices.  She has now come to a place of recognizing that she needs chemo, but can also celebrate the ways in which she has been able to remain strong and in good spirits because of the dietary and lifestyle changes she did make.  Surviving is important for her. She also expressed that it has important for her to not let the stresses of life get to her.  She recognizes that her spiritual, mental, and emotional health impact her physical health.  Though she has felt fear and apprehension she combats that with positive thinking.   Misty Arnold also spent some time talking about her former job.  Though the monetary compensation was great, she recognized how stressful that position was for her and how it would have thwarted her healing journey.  She has had to make a number of changes in receiving disability benefits versus what she used to financially bring in.  She even voiced that she lost her home.  She expressed how cancer changes a number of things.  Misty Arnold loves her healthy lifestyle but noted how expensive that it can be.  Chaplain and Misty Arnold talked about finding resources through Social Work to help offset some costs.    Chaplain provided presence, support, and listening.    10/10/21 1100  Clinical Encounter Type  Visited With Patient  Visit Type Initial

## 2021-10-10 NOTE — Progress Notes (Signed)
Pharmacy Antibiotic Note  Misty Arnold is a 50 y.o. female admitted on 10/03/2021 with recurrent wound infection.  Previous hospitalization from 08/25/21-09/05/21 for worsening R chest wall pain and infection.  PMH includes metastatic breast cancer (receiving Letrozole and Ibrance PTA).  Pharmacy has been consulted for vancomycin and cefepime dosing for chest wall wound infection, fungating mass.   Today, 10/10/21 WBC WNL Afebrile SCr = 0.67 is stable. However, given paraplegia and that TBW < IBW, will round Scr to 0.8 for vancomycin dosing Checking levels today 12/5 vancomycin peak @ 2259 = 32 mcg/mL 12/6 vancomycin trough @ 2003 = < 4 (lab reran level and confirmed level < 4) Currently on Vancomycin 1gm IV q24h - last dose given 10/10/21 @ 20:17  Plan: Continue cefepime 2g IV q8 hours, flagyl 500 mg IV q12h Change Vancomycin to 750mg  IV q12h with estimated AUC of 605.  Follow renal function.   Height: 5\' 5"  (165.1 cm) Weight: 54.5 kg (120 lb 3.2 oz) IBW/kg (Calculated) : 57  Temp (24hrs), Avg:98.7 F (37.1 C), Min:98.5 F (36.9 C), Max:98.8 F (37.1 C)  Recent Labs  Lab 10/04/21 0530 10/06/21 0608 10/07/21 1002 10/08/21 0547 10/09/21 2259 10/10/21 0533 10/10/21 2003  WBC 6.3  --  4.7 5.9  --   --   --   CREATININE 0.68 0.68 0.59 0.62  --  0.67  --   VANCOTROUGH  --   --   --   --   --   --  <4*  VANCOPEAK  --   --   --   --  32  --   --      Estimated Creatinine Clearance: 70.8 mL/min (by C-G formula based on SCr of 0.67 mg/dL).    Allergies  Allergen Reactions   Amoxicillin     Other reaction(s): rash/itching   Penicillins Rash    Did it involve swelling of the face/tongue/throat, SOB, or low BP? no Did it involve sudden or severe rash/hives, skin peeling, or any reaction on the inside of your mouth or nose? Yes Did you need to seek medical attention at a hospital or doctor's office? No When did it last happen? early 20's     If all above answers are "NO", may  proceed with cephalosporin use.   Pork-Derived Products    Penicillin G    Tamoxifen Hypertension    Antimicrobials this admission: Vancomycin 11/29 >>  Cefepime 11/29 >>  Flagyl 11/29 >>  Dose adjustments this admission: 12/1: Vancomycin 1250 q24h >> 1000 mg IV q24h 12/6 Change Vanc to 750mg  IV q12h  Microbiology results: 11/29 BCx: ngF  Thank you for allowing pharmacy to be a part of this patient's care.  Everette Rank, PharmD 10/10/21 11:30 PM

## 2021-10-10 NOTE — Progress Notes (Signed)
Pharmacy Antibiotic Note  Rielyn Krupinski is a 50 y.o. female admitted on 10/03/2021 with recurrent wound infection.  Previous hospitalization from 08/25/21-09/05/21 for worsening R chest wall pain and infection.  PMH includes metastatic breast cancer (receiving Letrozole and Ibrance PTA).  Pharmacy has been consulted for vancomycin and cefepime dosing for chest wall wound infection, fungating mass.   Today, 10/10/21 WBC WNL Afebrile SCr = 0.67 is stable. However, given paraplegia and that TBW < IBW, will round Scr to 1 for vancomycin dosing  Plan: Continue cefepime 2g IV q8 hours, flagyl 500 mg IV q12h Continue vancomycin 1000 mg IV q24h for estimated AUC of 504. Checking levels today 12/5 vancomycin peak @ 2259 = 32 mcg/mL 12/6 vancomycin trough ordered @ 1930 tonight Follow renal function. Calculate vancomycin AUC and adjust dose as needed once VT obtained  Height: 5\' 5"  (165.1 cm) Weight: 54.5 kg (120 lb 3.2 oz) IBW/kg (Calculated) : 57  Temp (24hrs), Avg:98.7 F (37.1 C), Min:98.4 F (36.9 C), Max:99.5 F (37.5 C)  Recent Labs  Lab 10/03/21 1407 10/04/21 0530 10/06/21 0608 10/07/21 1002 10/08/21 0547 10/09/21 2259 10/10/21 0533  WBC 7.9 6.3  --  4.7 5.9  --   --   CREATININE 0.84 0.68 0.68 0.59 0.62  --  0.67  VANCOPEAK  --   --   --   --   --  32  --      Estimated Creatinine Clearance: 70.8 mL/min (by C-G formula based on SCr of 0.67 mg/dL).    Allergies  Allergen Reactions   Amoxicillin     Other reaction(s): rash/itching   Penicillins Rash    Did it involve swelling of the face/tongue/throat, SOB, or low BP? no Did it involve sudden or severe rash/hives, skin peeling, or any reaction on the inside of your mouth or nose? Yes Did you need to seek medical attention at a hospital or doctor's office? No When did it last happen? early 20's     If all above answers are "NO", may proceed with cephalosporin use.   Pork-Derived Products    Penicillin G    Tamoxifen  Hypertension    Antimicrobials this admission: Vancomycin 11/29 >>  Cefepime 11/29 >>  Flagyl 11/29 >>  Dose adjustments this admission: 12/1: Vancomycin 1250 q24h >> 1000 mg IV q24h  Microbiology results: 11/29 BCx: ngF  Thank you for allowing pharmacy to be a part of this patient's care.  Lenis Noon, PharmD 10/10/21 11:17 AM

## 2021-10-10 NOTE — Progress Notes (Signed)
Physical Therapy Treatment Patient Details Name: Misty Arnold MRN: [id number] DOB: 09/14/1971 Today's Date: 10/10/2021   History of Present Illness Pt. is 50 yr old F admitted on 11/29 for CA related pain and infected R breast CA surgical wound. PMH: anemia, depression, HTN, breast CA x 10 yrs with mets to spine and ribs; and incomplete paraplegia 2* vertebral compression and now s/p tumor resection 3/21 with residual R LE deficits    PT Comments    Patient progressing very well with mobility and Supervision/Mod Independent for mobility in room and ambulation in hallway with no device. Patient ambulated ~ 400' with no LOB and completed stair mobility with safe sequencing and no cues needed. Educated pt on HEP for Rt LE strengthening and balance training. She no longer requires acute PT services and is safe to mobilize with RN/NT staff. Will refer to mobility team for additional activity during hospital stay. Acute PT signing off at this time.    Recommendations for follow up therapy are one component of a multi-disciplinary discharge planning process, led by the attending physician.  Recommendations may be updated based on patient status, additional functional criteria and insurance authorization.  Follow Up Recommendations  No PT follow up     Assistance Recommended at Discharge PRN  Equipment Recommendations  None recommended by PT    Recommendations for Other Services       Precautions / Restrictions Precautions Precautions: Fall Restrictions Weight Bearing Restrictions: No     Mobility  Bed Mobility Overal bed mobility: Modified Independent                  Transfers Overall transfer level: Modified independent                      Ambulation/Gait Ambulation/Gait assistance: Supervision;Modified independent (Device/Increase time) Gait Distance (Feet): 400 Feet Assistive device: None Gait Pattern/deviations: Step-through pattern;Shuffle;Wide base of  support;Decreased step length - right Gait velocity: mod pace     General Gait Details: pt with slight trendelenburt of Rt hip, slight hitchin gait but pt denied pain.  Supervision for safety with no device. pt reaching for hall rail to steady balance while negotiating obstacle of power cord; no LOB noted.   Stairs Stairs: Yes Stairs assistance: Supervision;Modified independent (Device/Increase time) Stair Management: One rail Left;Step to pattern;Forwards Number of Stairs: 5 General stair comments: pt using single rail, no cues needed and pt steady throughout.   Wheelchair Mobility    Modified Rankin (Stroke Patients Only)       Balance Overall balance assessment: Mild deficits observed, not formally tested                                          Cognition Arousal/Alertness: Awake/alert Behavior During Therapy: WFL for tasks assessed/performed Overall Cognitive Status: Within Functional Limits for tasks assessed                                          Exercises Other Exercises Other Exercises: 1x15 reps bil LE heel raises and toe raises with bil UE support Other Exercises: 15 reps hip hike/drop for Rt gluteus medius strengthening on step Other Exercises: 10 reps Rt SL sit<>stand with Lt heel touch to steady balance Other Exercises: 10 reps Rt hip abduction   in sidelying    General Comments        Pertinent Vitals/Pain Pain Assessment: No/denies pain Pain Intervention(s): Monitored during session    Home Living                          Prior Function            PT Goals (current goals can now be found in the care plan section) Acute Rehab PT Goals Patient Stated Goal: Regain previous level of mobility PT Goal Formulation: With patient Time For Goal Achievement: 10/22/21 Potential to Achieve Goals: Good Progress towards PT goals: Goals met/education completed, patient discharged from PT    Frequency    Min  3X/week      PT Plan Current plan remains appropriate    Co-evaluation              AM-PAC PT "6 Clicks" Mobility   Outcome Measure  Help needed turning from your back to your side while in a flat bed without using bedrails?: None Help needed moving from lying on your back to sitting on the side of a flat bed without using bedrails?: None Help needed moving to and from a bed to a chair (including a wheelchair)?: None Help needed standing up from a chair using your arms (e.g., wheelchair or bedside chair)?: None Help needed to walk in hospital room?: A Little Help needed climbing 3-5 steps with a railing? : A Little 6 Click Score: 22    End of Session Equipment Utilized During Treatment: Gait belt Activity Tolerance: Patient tolerated treatment well Patient left: in chair;with call bell/phone within reach;with family/visitor present Nurse Communication: Mobility status PT Visit Diagnosis: Other symptoms and signs involving the nervous system (R29.898)     Time: 1044-1109 PT Time Calculation (min) (ACUTE ONLY): 25 min  Charges:  $Gait Training: 8-22 mins $Therapeutic Exercise: 8-22 mins                     Rachel Q. PT, DPT Acute Rehabilitation Services Office [phone] Pager [phone]    Rachel E Quinn-Brown 10/10/2021, 12:40 PM  

## 2021-10-10 NOTE — Progress Notes (Signed)
Pt has an order for PICC placement. Consent was obtained but pt prefers to be done in the morning. RN unit was notified. Has lt piv which is adequate access for tonight. PICC RN will follow up in am.

## 2021-10-10 NOTE — Consult Note (Signed)
[facility]                                                                                        Patient Identification: Patient Name: Misty Arnold MRN: [id number] [place] Date: 10/03/2021  6:03 PM Today's Date: 10/10/2021 Reason for consult: concern for osteomyelitis  Requesting provider: Lane Hacker   Principal Problem:   Cancer related pain Active Problems:   Breast cancer of upper-outer quadrant of right female breast ([place])   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)   Epidural mass   Myelopathy ([place])   Incomplete paraplegia ([place])   Palliative care patient   Malnutrition of moderate degree   Antibiotics: Vancomycin 11/29-current                     Cefepime 11/29-current                     Metronidazole 11/29-current   Lines/Hardware:  Assessment Chronic deep Rt sided chest wall wound in the setting of metastatic breast ca - febrile on admission with reported h/o light green discharge on admission. No erythema or drainage noted. No leukocytosis.   Loculated Rt Pleural effusion - appears comfortable in RA, likely malignant, continue to monitor  Metastatic Breast ca - Plan for [place] next week and follow up with Radiation at [facility]. Oncology following   Recommendations  Continue wound care. She was out of aquacel for few days and needs help with her dressing supplies. She is struggling to get her dressing needs.  Will DC Vancomycin/cefepime ->Plan for one dose of oritavancin and levofoxacin for 5 more days to treat as a probable skin and soft tissue infection Discussed with Dr Hilma Favors Management of metastatic breast ca per Oncology  ID will sign off.   Rest of the management as per the primary team. Please call with questions or concerns.  Thank you for the consult  Rosiland Oz, MD Infectious Disease Physician [facility] for Infectious  Disease [address] [place], [place] [address] Phone: [phone]  Fax: [phone]  __________________________________________________________________________________________________________ HPI and Hospital Course: 50 Y O female with metastatic breast ca ( rt breast ca s/p rt mastectomy followed by reconstruction 01/2011  followed by rt chest wall nodule excision 8/20 with positive margins ( metastatic ca) with progressing lesions on MRI 01/2014 followed by Rt mastectomy 08/2015,  decompressive laminectomy with tumor resection at T9 01/2020, Palliative radiation 01/2020-02/2020 followed my metastasis to spinal canal in 01/2020) who is progressing on hormone therapy was admitted for concern of right chest wall infection by Dr. Nyoka Cowden.  She tells she noticed change in color of drainage from her right chest wound for the last couple of days(light green) prior to admission.  Denies any fever, chills.  Denies nausea, vomiting or diarrhea.  She states she has been doing her own dressing changes because wound care is not spending as much time for her wound care.  She is also having issues with getting all her supplies for her wound care. She was out of aquacel for few days. Of note she  was recently admitted 10/22-11/1 for concern of worsening wound infection.  Received IV vancomycin/cefepime which was transitioned to doxycycline and levofloxacin on discharge to complete 10 days of treatment.  Patient states she noticed light greenish discoloration of the discharge after she stopped taking her antibiotics.   At ED , febrile with T max 101.4, WBC 7.9 11/29 blood cx no growth in both sets  Imagings as below  ROS: General- Denies fever, chills, loss of appetite and loss of weight HEENT - Denies headache, blurry vision, neck pain, sinus pain Chest - Denies any chest pain, SOB or cough CVS- Denies any dizziness/lightheadedness, syncopal attacks, palpitations Abdomen- Denies any nausea, vomiting,  abdominal pain, hematochezia and diarrhea Neuro - Denies any weakness, numbness, tingling sensation Psych - Denies any changes in mood irritability or depressive symptoms GU- Denies any burning, dysuria, hematuria or increased frequency of urination Skin - rt chest open wound  MSK - denies any joint pain/swelling or restricted ROM   Past Medical History:  Diagnosis Date   Abnormal uterine bleeding    Anemia    Breast cancer ([place])    Depression    Fibroid    Hormone disorder    Hypertension    Infertility, female    Past Surgical History:  Procedure Laterality Date   ABDOMINAL HYSTERECTOMY     BREAST SURGERY     LAMINECTOMY N/A 01/04/2020   Procedure: Thoracic Eight, Thoracic Nine THORACIC LAMINECTOMY FOR TUMOR with Thoracic Seven-Thoracic Ten instrumentation;  Surgeon: [name], MD;  Location: [facility];  Service: Neurosurgery;  Laterality: N/A;  Thoracic Eight, Thoracic Nine THORACIC LAMINECTOMY FOR TUMOR with Thoracic Seven-Thoracic Ten instrumentation   MASTECTOMY     salivary gland stone       Scheduled Meds:  (feeding supplement) PROSource Plus  30 mL Oral TID BM   cholecalciferol  5,000 Units Oral BID   enoxaparin (LOVENOX) injection  40 mg Subcutaneous Q24H   oxyCODONE  15 mg Oral Q12H   polyethylene glycol  17 g Oral Daily   pregabalin  50 mg Oral QHS   senna-docusate  1 tablet Oral QHS   sodium chloride flush  3 mL Intravenous Q12H   thiamine  100 mg Oral Daily   Continuous Infusions:  sodium chloride 75 mL/hr at 10/10/21 0315   ceFEPime (MAXIPIME) IV 2 g (10/10/21 0552)   metronidazole 500 mg (10/10/21 0316)   vancomycin 1,000 mg (10/09/21 2045)   PRN Meds:.diazepam, lip balm, morphine injection  Allergies  Allergen Reactions   Amoxicillin     Other reaction(s): rash/itching   Penicillins Rash    Did it involve swelling of the face/tongue/throat, SOB, or low BP? no Did it involve sudden or severe rash/hives, skin peeling, or any reaction on the inside of  your mouth or nose? Yes Did you need to seek medical attention at a hospital or doctor's office? No When did it last happen? early 20's     If all above answers are "NO", may proceed with cephalosporin use.   Pork-Derived Products    Penicillin G    Tamoxifen Hypertension   Social History   Socioeconomic History   Marital status: Divorced    Spouse name: Not on file   Number of children: Not on file   Years of education: 12   Highest education level: Not on file  Occupational History   Occupation: disabled  Tobacco Use   Smoking status: Never   Smokeless tobacco: Never  Vaping Use   Vaping Use:  Never used  Substance and Sexual Activity   Alcohol use: Not Currently   Drug use: No   Sexual activity: Yes    Partners: Male    Birth control/protection: Surgical, Condom  Other Topics Concern   Not on file  Social History Narrative   Lives at home with her grandmother   Right handed   Caffeine seldom, coffee once or twice monthly   Social Determinants of Health   Financial Resource Strain: Not on file  Food Insecurity: Not on file  Transportation Needs: Not on file  Physical Activity: Not on file  Stress: Not on file  Social Connections: Not on file  Intimate Partner Violence: Not on file   Family History  Problem Relation Age of Onset   Stroke Mother    Heart attack Mother    Diabetes Mother    Hyperlipidemia Mother    Heart disease Mother    Diabetes Father    Stroke Father    Stroke Maternal Grandmother    Cancer Maternal Grandfather    Colon cancer Maternal Grandfather    Stroke Maternal Grandfather    Stroke Paternal Grandmother    Heart attack Paternal Grandmother    Heart failure Paternal Grandmother    Diabetes Paternal Grandmother    Hyperlipidemia Paternal Grandmother    CAD Paternal Grandmother    Cancer Paternal Grandfather    Colon cancer Paternal Grandfather    Cancer Maternal Aunt 27       breast   Stroke Maternal Aunt    Heart attack  Maternal Aunt    Breast cancer Maternal Aunt     Vitals  BP 128/88 (BP Location: Left Arm)   Pulse 91   Temp 98.3 F (36.8 C) (Oral)   Resp 20   Ht 5\' 5"  (1.651 m)   Wt 54.5 kg   SpO2 95%   BMI 20.00 kg/m    Physical Exam Constitutional:  Not in acute distress, sitting in bed    Comments:   Cardiovascular:     Rate and Rhythm: Normal rate and regular rhythm.     Heart sounds:   Pulmonary:     Effort: Pulmonary effort is normal.     Comments: clear lung sounds bilaterally   Abdominal:     Palpations: Abdomen is soft.     Tenderness: non tender and non distended   Musculoskeletal:        General: No swelling or tenderness.   Skin:    Comments:    Neurological:     General: No focal deficit present.   Psychiatric:        Mood and Affect: Mood normal.    Pertinent Microbiology Results for orders placed or performed during the hospital encounter of 10/03/21  Culture, blood (Routine X 2) w Reflex to ID Panel     Status: None   Collection Time: 10/03/21  7:56 PM   Specimen: BLOOD RIGHT ARM  Result Value Ref Range Status   Specimen Description   Final    BLOOD RIGHT ARM Performed at [facility], [place] [address], [place], [place] [address]    Special Requests   Final    BOTTLES DRAWN AEROBIC ONLY Blood Culture adequate volume Performed at [facility] [address], [place], [place] [address]    Culture   Final    NO GROWTH 5 DAYS Performed at [facility], [place] [address], [place],  [address]    Report Status 10/09/2021  FINAL  Final  Culture, blood (Routine X 2) w Reflex to ID Panel     Status: None   Collection Time: 10/03/21  7:56 PM   Specimen: BLOOD RIGHT ARM  Result Value Ref Range Status   Specimen Description   Final    BLOOD RIGHT ARM Performed at [facility] [address], [place], [place] [address]    Special Requests   Final    BOTTLES DRAWN AEROBIC ONLY Blood  Culture adequate volume Performed at [facility] [address], [place], [place] [address]    Culture   Final    NO GROWTH 5 DAYS Performed at [facility], [place] [address], [place], [place] [address]    Report Status 10/09/2021 FINAL  Final      Pertinent Lab seen by me: CBC Latest Ref Rng & Units 10/08/2021 10/07/2021 10/04/2021  WBC 4.0 - 10.5 K/uL 5.9 4.7 6.3  Hemoglobin 12.0 - 15.0 g/dL 9.3(L) 9.0(L) 9.9(L)  Hematocrit 36.0 - 46.0 % 28.3(L) 28.0(L) 29.9(L)  Platelets 150 - 400 K/uL 351 363 381   CMP Latest Ref Rng & Units 10/10/2021 10/08/2021 10/07/2021  Glucose 70 - 99 mg/dL - 105(H) 93  BUN 6 - 20 mg/dL - 9 7  Creatinine 0.44 - 1.00 mg/dL 0.67 0.62 0.59  Sodium 135 - 145 mmol/L - 136 138  Potassium 3.5 - 5.1 mmol/L - 3.8 3.1(L)  Chloride 98 - 111 mmol/L - 105 106  CO2 22 - 32 mmol/L - 26 26  Calcium 8.9 - 10.3 mg/dL - 8.4(L) 8.7(L)  Total Protein 6.5 - 8.1 g/dL - - -  Total Bilirubin 0.3 - 1.2 mg/dL - - -  Alkaline Phos 38 - 126 U/L - - -  AST 15 - 41 U/L - - -  ALT 0 - 44 U/L - - -     Pertinent Imagings/Other Imagings Plain films and CT images have been personally visualized and interpreted; radiology reports have been reviewed. Decision making incorporated into the Impression / Recommendations. CT HEAD WO CONTRAST (5MM)  Result Date: 10/05/2021 CLINICAL DATA:  Breast cancer EXAM: CT HEAD WITHOUT CONTRAST TECHNIQUE: Contiguous axial images were obtained from the base of the skull through the vertex without intravenous contrast. COMPARISON:  Brain MRI 02/20/2018 FINDINGS: Brain: There is no evidence of acute intracranial hemorrhage, extra-axial fluid collection, or acute infarct. The parenchymal volume is normal. The ventricles are normal in size. There is no evidence of solid mass lesion on this noncontrast CT. There is no midline shift. Vascular: No hyperdense vessel or unexpected calcification. Skull: Normal. Negative for fracture or focal  lesion. Sinuses/Orbits: The imaged paranasal sinuses are clear. The globes and orbits are unremarkable. Other: None. IMPRESSION: No evidence of intracranial metastatic disease on this noncontrast head CT. Note that if there is clinical concern for metastatic disease, MR of the head with and without contrast is recommended. Electronically Signed   By: Valetta Mole M.D.   On: 10/05/2021 13:57   CT CHEST W CONTRAST  Result Date: 10/05/2021 CLINICAL DATA:  Large irregular multi EXAM: CT CHEST WITH CONTRAST TECHNIQUE: Multidetector CT imaging of the chest was performed during intravenous contrast administration. CONTRAST:  60mL OMNIPAQUE IOHEXOL 350 MG/ML SOLN COMPARISON:  CT chest, abdomen and pelvis dated Mar 16, 2021 FINDINGS: Cardiovascular: Normal heart size. 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 No pericardial effusion. No significant coronary artery calcifications or atherosclerotic disease of the thoracic aorta. No suspicious central filling defects  of the pulmonary arteries. Mediastinum/Nodes: Esophagus and thyroid are unremarkable. Enlarged bilateral axillary lymph nodes and right supraclavicular lymph nodes, increased in size when compared with prior exam. Reference left axillary lymph node measuring 4.0 by 2.7 cm on series 2, image 39, previously 3.6 x 1.6 cm. Lungs/Pleura: Central airways are patent. Small loculated right pleural effusion with associated atelectasis, new compared to prior exam. No suspicious pulmonary nodules. Upper Abdomen: Heterogeneous enhancing lesion of the left lobe of the liver measuring approximately 3.1 x 1.8 cm on series 2, image 143, new compared to prior exam. Musculoskeletal: Large irregular right chest wall mass, largest component measures up to 10.1 by 5.9 cm, previously 9.3 x 5.0 cm. New areas of ulceration are seen. New osseous destruction of the adjacent 4th-6th ribs with invasion into the anterior right chest wall. Expansile osseous right rib and sternal  lesions with bulky soft tissue components are increased in size compared to prior exam. Reference lesion of the posterolateral ninth rib measuring 3.4 x 2.7 cm on series 2, image 135, previously measured 2.9 x 1.8 cm. Soft tissue lesion of the left breast located on image 77 measuring 4.2 x 2.8 cm, previously 3.4 x 2.7 cm. Posterior fusion of T5-T10. Large lytic lesion involving the posterior aspect of vertebral bodies of T7-T9 is increased in size when compared to the prior exam. IMPRESSION: 1. Large irregular right chest wall mass is increased in size when compared with prior exam. There are new areas of ulceration and increased extension into the anterior right chest wall and osseous destruction of the anterior right 4th-6th ribs. 2. Expansile osseous right rib and sternal lesions with bulky soft tissue components are increased in size compared to prior exam. 3. New loculated right pleural effusion. 4. Increased axillary and supraclavicular adenopathy and increased size of left breast mass. 5. Posterior fusion of T5-T10. Large lytic lesion involving the posterior aspect of vertebral bodies of T7-T9 is increased in size when compared to the prior exam. Evaluation of the spinal canal is limited due to streak artifact. 6. New heterogeneous lesion of the left hepatic lobe, compatible with hepatic metastatic disease. Electronically Signed   By: Yetta Glassman M.D.   On: 10/05/2021 14:16      I spent more than 70  minutes for this patient encounter including review of prior medical records/discussing diagnostics and treatment plan with the patient/family/coordinate care with primary/other specialits with greater than 50% of time in face to face encounter.   Electronically signed by:   Rosiland Oz, MD Infectious Disease Physician [facility] for Infectious Disease Pager: [phone]

## 2021-10-11 ENCOUNTER — Telehealth: Payer: Self-pay | Admitting: Hematology and Oncology

## 2021-10-11 MED ORDER — ORITAVANCIN DIPHOSPHATE 400 MG IV SOLR
1200.0000 mg | Freq: Once | INTRAVENOUS | Status: AC
  Administered 2021-10-11: 1200 mg via INTRAVENOUS
  Filled 2021-10-11: qty 120

## 2021-10-11 MED ORDER — LEVOFLOXACIN 750 MG PO TABS
750.0000 mg | ORAL_TABLET | Freq: Every day | ORAL | Status: DC
  Administered 2021-10-11 – 2021-10-14 (×4): 750 mg via ORAL
  Filled 2021-10-11 (×4): qty 1

## 2021-10-11 NOTE — Progress Notes (Signed)
   10/11/21 1100  Mobility  Activity Ambulated in hall  Level of Assistance Standby assist, set-up cues, supervision of patient - no hands on  Assistive Device Other (Comment) (IV pole)  Distance Ambulated (ft) 350 ft  Mobility Ambulated with assistance in hallway  Mobility Response Tolerated well  Mobility performed by Mobility specialist  $Mobility charge 1 Mobility   Pt agreeable to mobilize this morning. Prior to session, pt requested to use bathroom. She ambulated about 353ft in hall with IV pole, tolerated well. No complaints, however she did note that he right leg was a bit weaker than her left but this is normal for her current circumstances. Left pt EOB, as she requested. RN notified of session.  [facility] Specialist Acute Rehab Services Office: [phone]

## 2021-10-11 NOTE — Progress Notes (Signed)
PICC not needed at this time per Drs Hilma Favors and [place].

## 2021-10-11 NOTE — Progress Notes (Signed)
Inpatient Rehab Admissions Coordinator:   Consult received and chart reviewed.  Note that pt mobilizing with PT up to 400' without assist or DME.  Recommendations are for no therapy f/u.  In order to qualify for CIR, pt would need to demonstrate need for aggressive therapy in at least 2 disciplines.  She is not a candidate at this time, will sign off.    Shann Medal, PT, DPT Admissions Coordinator [phone] 10/11/21  11:35 AM

## 2021-10-11 NOTE — Progress Notes (Signed)
Per Collene Mares MD,  hold PICC placement until further notice.

## 2021-10-11 NOTE — TOC Initial Note (Signed)
Transition of Care [facility]) - Initial/Assessment Note    Patient Details  Name: Misty Arnold MRN: [id number] Date of Birth: 27-Jun-1971  Transition of Care [facility]) CM/SW Contact:    Lynnell Catalan, RN Phone Number: 10/11/2021, 3:46 PM  Clinical Narrative:                  Spoke with pt at length for dc planning. Pt lives with her boyfriend 2 hours away in [facility] [place]. She comes to [facility] for any medical care needed. Pt states that she will be beginning chemo on Monday and it is scheduled every 4 weeks. She states her boyfriend is able to take her to those appointments. Unsure at this time how long pt will need IV abx. It was explained to pt that if she needs to dc with IV abx she can either have the IV abx at home or she could go to SNF. Pt states she is very hesitant to go to SNF because she is worried about the cleanliness of SNF facilities and her susceptibility to infection.  MD made aware of conversation. TOC will continue to follow.         Activities of Daily Living Home Assistive Devices/Equipment: Cane (specify quad or straight), Walker (specify type) ADL Screening (condition at time of admission) Patient's cognitive ability adequate to safely complete daily activities?: Yes Is the patient deaf or have difficulty hearing?: No Does the patient have difficulty seeing, even when wearing glasses/contacts?: No Does the patient have difficulty concentrating, remembering, or making decisions?: Yes (due to pain) Patient able to express need for assistance with ADLs?: Yes Does the patient have difficulty dressing or bathing?: No Independently performs ADLs?: No Communication: Independent Dressing (OT): Independent Grooming: Independent Feeding: Independent Bathing: Independent Toileting: Needs assistance Is this a change from baseline?: Pre-admission baseline In/Out Bed: Needs assistance Is this a change from baseline?: Pre-admission baseline Walks in Home: Needs  assistance Is this a change from baseline?: Pre-admission baseline Does the patient have difficulty walking or climbing stairs?: Yes Weakness of Legs: Both Weakness of Arms/Hands: Both  Permission Sought/Granted                  Emotional Assessment              Admission diagnosis:  Cancer related pain [G89.3] Patient Active Problem List   Diagnosis Date Noted   Malnutrition of moderate degree 10/05/2021   Breast wound 08/28/2021   Infected wound 08/26/2021   Essential hypertension 08/26/2021   Wound infection 05/12/2021   Cancer related pain 05/12/2021   AKI (acute kidney injury) ([place]) 05/11/2021   Palliative care patient 02/15/2020   Myelopathy ([place]) 01/08/2020   Incomplete paraplegia ([place]) 01/08/2020   Epidural mass    Metastatic breast cancer ([place])    Weakness of both legs    Leucocytosis    Acute blood loss anemia    Neurogenic bladder    Neurogenic bowel    S/P laminectomy 01/04/2020   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 [address] Phone: [phone] Fax: [phone]  [facility] [address] [place] [address] Phone: [phone] Fax: [place] [address] Phone: [phone] Fax: [phone]  Social Determinants of Health (SDOH) Interventions    Readmission Risk Interventions No flowsheet data found.   

## 2021-10-11 NOTE — Progress Notes (Signed)
Daily Progress Note   Patient Name: Misty Arnold       Date: 10/11/2021 DOB: 24-Dec-1970  Age: 50 y.o. MRN#: [id number] Attending Physician: Acquanetta Chain, DO Primary Care Physician: Nicholas Lose, MD Admit Date: 10/03/2021  Reason for Consultation/Follow-up: Pain control  Subjective: No new issues or complaints today. She continues to feel better since admission. Decreased drainage from her wound, however she reports that yesterday there was a large amount of bleeding when drg was removed-it saturated her bed.  Length of Stay: 8  Current Medications: Scheduled Meds:  . (feeding supplement) PROSource Plus  30 mL Oral TID BM  . cholecalciferol  5,000 Units Oral BID  . enoxaparin (LOVENOX) injection  40 mg Subcutaneous Q24H  . oxyCODONE  15 mg Oral Q12H  . polyethylene glycol  17 g Oral Daily  . pregabalin  50 mg Oral QHS  . senna-docusate  1 tablet Oral QHS  . sodium chloride flush  3 mL Intravenous Q12H  . thiamine  100 mg Oral Daily    Continuous Infusions: . sodium chloride 75 mL/hr at 10/11/21 0807  . ceFEPime (MAXIPIME) IV Stopped (10/11/21 0654)  . metronidazole 500 mg (10/11/21 0433)  . vancomycin 750 mg (10/11/21 1019)    PRN Meds: diazepam, lip balm, morphine injection  Physical Exam          Vital Signs: BP 128/88 (BP Location: Left Arm)   Pulse 91   Temp 98.3 F (36.8 C) (Oral)   Resp 20   Ht 5' 5"  (1.651 m)   Wt 54.5 kg   SpO2 95%   BMI 20.00 kg/m  SpO2: SpO2: 95 % O2 Device: O2 Device: Room Air O2 Flow Rate:    Intake/output summary:  Intake/Output Summary (Last 24 hours) at 10/11/2021 1111 Last data filed at 10/11/2021 2924 Gross per 24 hour  Intake 3698.72 ml  Output --  Net 3698.72 ml    LBM: Last BM Date: 10/10/21 Baseline Weight:  Weight: 54.5 kg Most recent weight: Weight: 54.5 kg       Palliative Assessment/Data:      Patient Active Problem List   Diagnosis Date Noted  . Malnutrition of moderate degree 10/05/2021  . Breast wound 08/28/2021  . Infected wound 08/26/2021  . Essential hypertension 08/26/2021  . Wound infection 05/12/2021  . Cancer  related pain 05/12/2021  . AKI (acute kidney injury) ([place]) 05/11/2021  . Palliative care patient 02/15/2020  . Myelopathy ([place]) 01/08/2020  . Incomplete paraplegia ([place]) 01/08/2020  . Epidural mass   . Metastatic breast cancer ([place])   . Weakness of both legs   . Leucocytosis   . Acute blood loss anemia   . Neurogenic bladder   . Neurogenic bowel   . S/P laminectomy 01/04/2020  . 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 Breast cancer of upper-outer quadrant of right female breast ([place]) 11/02/2014    Palliative Care Assessment & Plan   Patient Profile: Misty Arnold is a 49 y.o. 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 She was seen in the outpatient cancer center palliative clinic today and was noted to have fever and possible wound infection. She has been failing at home and unable to care for herself and doing her own wound care. She has progressive metastatic cancer on Ibrance.  Cancer history (from Dr. 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   1. Right mastectomy 08/29/2015 at [place] (Dr.Singh): IDC with invol of  skin and skin ulceration, breast capsule inv cancer, superior medial margin positive, 1/1 LN positive, grade 3, 14.3 cm, 3.5 cm, ALI, chest wall involv, ER 90-100%, PR 80-90%, HER-2 negative, Ki 67 40-50% T4CN1 (St 3B) 2. Patient started tamoxifen but developed severe headache with severe hypertension and tamoxifen was discontinued 03/17/2016.  3. 02/2017 started on Exemestane, Zoladex started 05/29/2017 4.  Hospitalization for paraplegia: T9 vertebral body compression status post decompressive laminectomy and tumor resection 01/04/2020 5.  Palliative radiation to the thoracic spine 01/28/2020 6.  Ibrance with letrozole started April 2021 discontinued 07/24/2021 7.  Enhertu started 07/31/2021 every 3 weeks   Assessment: Infection of Malignant Chest Wall Wound, Fungating Mass Signs of systemic infection with fever, tachycardia and worsening pain and wound drainage have improved on broad spectrum abx Vanc, cefipime and flagyl. CT scan unfortunately confirms deeper disease and involvement of the bone structures of her chest wall and worsening ulcerations and overall interval progression of her cancer including a new liver lesion.  Maintain IV antibiotics given bone involvement- I have placed ID consult and awaiting recommendations. Will order PICC n anticipation of IV abx need. Appreciate Radiation Oncology consultation, Misty Arnold wants to keep her appt at [facility] with rad onc for [facility] tx although Dr, Sondra Come is concerned that her extensive issues may not be amenable to this intervention and has made recs for standard treatment. Continue current drg changes- getting supplies has been an issue for her She would like to start Enhertu and Dr. Lindi Adie would like to start first tx on Monday. I am concerned about bleeding areas of her wound-this will need ongoing monitoring and careful attention to wound care.  2. Cancer related pain Oxycontin 15 BID and use Morphine 87m q2prn for breakthrough pain.  She has responded  very well to this regimen- will continue. Will transition to oral morphine for breakthrough at discharge.   3. Malnutrition, dehydration Low albumin, thin, clinically appears dry- now  taking PO well.  Appreciate Nutrition recs-she asking for a nutritional supplement that she got in rad onc-will try to provide for her if possible. Boost Mint Peach oral supplement.     Code Status: Full code will have ongoing conversations with her about this. CPR given her chest wall would not be recommended or helpful to her in the event of cardiac arrest given her metastatic disease. DVT Prophylaxis: She has tolerated Lovenox in past- will start this today for DVT prophylaxis Family Communication: Patient has full capacity Disposition Plan: She will need either a very high level of in home care and support or an assisited living type facility for care eventually- she is motivated to care for herself but has significant needs. Will ask PT OT to see her for mobility recs.I think at least short term she would benefit from CIR to help with her pain control, wound care and mobility issues and ongoing medical oversight to maximize her QOL and functional status.If COR nopt possible a short term SNF stay may be helpful. Will ask TOC to help with discharge plan.      Greater than 50%  of this time was spent counseling and coordinating care related to the above assessment and plan.  Lane Hacker, DO  Please contact Palliative Medicine Team phone at [phone] for questions and concerns.

## 2021-10-11 NOTE — Telephone Encounter (Signed)
Scheduled per sch msg. Called and spoke with patient. Confirmed appt  

## 2021-10-11 NOTE — Progress Notes (Signed)
Spoke with Verline Lema RN regarding number of lumens may need for discharge. Agreed that a single lumen is sufficient for home antibiotics.

## 2021-10-12 DIAGNOSIS — T148XXA Other injury of unspecified body region, initial encounter: Secondary | ICD-10-CM | POA: Diagnosis present

## 2021-10-12 DIAGNOSIS — C801 Malignant (primary) neoplasm, unspecified: Secondary | ICD-10-CM | POA: Diagnosis present

## 2021-10-12 LAB — CBC
HCT: 29.3 % — ABNORMAL LOW (ref 36.0–46.0)
Hemoglobin: 9.6 g/dL — ABNORMAL LOW (ref 12.0–15.0)
MCH: 30 pg (ref 26.0–34.0)
MCHC: 32.8 g/dL (ref 30.0–36.0)
MCV: 91.6 fL (ref 80.0–100.0)
Platelets: 346 10*3/uL (ref 150–400)
RBC: 3.2 MIL/uL — ABNORMAL LOW (ref 3.87–5.11)
RDW: 16.2 % — ABNORMAL HIGH (ref 11.5–15.5)
WBC: 5.7 10*3/uL (ref 4.0–10.5)
nRBC: 0 % (ref 0.0–0.2)

## 2021-10-12 LAB — COMPREHENSIVE METABOLIC PANEL
ALT: 69 U/L — ABNORMAL HIGH (ref 0–44)
AST: 84 U/L — ABNORMAL HIGH (ref 15–41)
Albumin: 3 g/dL — ABNORMAL LOW (ref 3.5–5.0)
Alkaline Phosphatase: 66 U/L (ref 38–126)
Anion gap: 7 (ref 5–15)
BUN: 10 mg/dL (ref 6–20)
CO2: 28 mmol/L (ref 22–32)
Calcium: 8.9 mg/dL (ref 8.9–10.3)
Chloride: 103 mmol/L (ref 98–111)
Creatinine, Ser: 0.64 mg/dL (ref 0.44–1.00)
GFR, Estimated: >60 mL/min (ref >60)
Glucose, Bld: 114 mg/dL — ABNORMAL HIGH (ref 70–99)
Potassium: 3.3 mmol/L — ABNORMAL LOW (ref 3.5–5.1)
Sodium: 138 mmol/L (ref 135–145)
Total Bilirubin: 0.4 mg/dL (ref 0.3–1.2)
Total Protein: 6.5 g/dL (ref 6.5–8.1)

## 2021-10-12 LAB — TSH: TSH: 2.046 u[IU]/mL (ref 0.350–4.500)

## 2021-10-12 LAB — MAGNESIUM: Magnesium: 2.1 mg/dL (ref 1.7–2.4)

## 2021-10-12 MED ORDER — POTASSIUM CHLORIDE CRYS ER 20 MEQ PO TBCR
20.0000 meq | EXTENDED_RELEASE_TABLET | Freq: Two times a day (BID) | ORAL | Status: DC
  Administered 2021-10-12 – 2021-10-16 (×9): 20 meq via ORAL
  Filled 2021-10-12 (×9): qty 1

## 2021-10-12 MED ORDER — PROSOURCE PLUS PO LIQD
30.0000 mL | Freq: Three times a day (TID) | ORAL | Status: DC
  Administered 2021-10-12 – 2021-10-16 (×11): 30 mL via ORAL
  Filled 2021-10-12 (×9): qty 30

## 2021-10-12 MED ORDER — MORPHINE SULFATE 15 MG PO TABS
15.0000 mg | ORAL_TABLET | ORAL | Status: DC | PRN
  Administered 2021-10-12 (×2): 15 mg via ORAL
  Filled 2021-10-12 (×3): qty 1

## 2021-10-12 MED ORDER — VITAMIN D 25 MCG (1000 UNIT) PO TABS
5000.0000 [IU] | ORAL_TABLET | Freq: Every day | ORAL | Status: DC
  Administered 2021-10-13 – 2021-10-16 (×4): 5000 [IU] via ORAL
  Filled 2021-10-12 (×4): qty 5

## 2021-10-12 MED ORDER — ATENOLOL 50 MG PO TABS
50.0000 mg | ORAL_TABLET | Freq: Two times a day (BID) | ORAL | Status: DC
  Administered 2021-10-12 – 2021-10-16 (×9): 50 mg via ORAL
  Filled 2021-10-12 (×9): qty 1

## 2021-10-12 NOTE — Progress Notes (Signed)
Daily Progress Note   Patient Name: Misty Arnold       Date: 10/12/2021 DOB: 06/30/1971  Age: 50 y.o. MRN#: [id number] Attending Physician: Acquanetta Chain, DO Primary Care Physician: Nicholas Lose, MD Admit Date: 10/03/2021  Reason for Consultation/Follow-up: Pain control  Subjective: No new issues or complaints today. She is considering her options for managing her care when she leaves the hospital and trying to work out details of where she is going to stay-she gets  most of her tx here in [place] and travels here from [place] which has been challenging. 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 Her wound requires careful attention and management and she has been overwhelmed with managing her cancer and stressors in her life.  Length of Stay: 9  Current Medications: Scheduled Meds:  . (feeding supplement) PROSource Plus  30 mL Oral TID BM  . cholecalciferol  5,000 Units Oral BID  . enoxaparin (LOVENOX) injection  40 mg Subcutaneous Q24H  . levofloxacin  750 mg Oral Daily  . oxyCODONE  15 mg Oral Q12H  . polyethylene glycol  17 g Oral Daily  . pregabalin  50 mg Oral QHS  . senna-docusate  1 tablet Oral QHS  . sodium chloride flush  3 mL Intravenous Q12H  . thiamine  100 mg Oral Daily    Continuous Infusions: . sodium chloride Stopped (10/11/21 1503)    PRN Meds: diazepam, lip balm, morphine injection  Physical Exam          Vital Signs: BP (!) 155/103 (BP Location: Left Arm)   Pulse (!) 103   Temp (!) 97.4 F (36.3 C) (Oral)   Resp 18   Ht 5' 5"  (1.651 m)   Wt 54.5 kg   SpO2 98%   BMI 20.00 kg/m  SpO2: SpO2: 98 % O2 Device: O2 Device: Room Air O2 Flow Rate:    Intake/output summary:  Intake/Output Summary (Last 24 hours) at 10/12/2021  0757 Last data filed at 10/11/2021 1526 Gross per 24 hour  Intake 4133.51 ml  Output --  Net 4133.51 ml    LBM: Last BM Date: 10/10/21 Baseline Weight: Weight: 54.5 kg Most recent weight: Weight: 54.5 kg       Palliative Assessment/Data:      Patient Active Problem List  Diagnosis Date Noted  . Malnutrition of moderate degree 10/05/2021  . Breast wound 08/28/2021  . Infected wound 08/26/2021  . Essential hypertension 08/26/2021  . Wound infection 05/12/2021  . Cancer related pain 05/12/2021  . AKI (acute kidney injury) ([place]) 05/11/2021  . Palliative care patient 02/15/2020  . Myelopathy ([place]) 01/08/2020  . Incomplete paraplegia ([place]) 01/08/2020  . Epidural mass   . Metastatic breast cancer ([place])   . Weakness of both legs   . Leucocytosis   . Acute blood loss anemia   . Neurogenic bladder   . Neurogenic bowel   . S/P laminectomy 01/04/2020  . 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 Breast cancer of upper-outer quadrant of right female breast ([place]) 11/02/2014    Palliative Care Assessment & Plan   Patient Profile: Misty Arnold is a 50 y.o. 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 She was seen in the outpatient cancer center palliative clinic today and was noted to have fever and possible wound infection. She has been failing at home and unable to care for herself and doing her own wound care. She has progressive metastatic cancer on Ibrance.  Cancer history (from Dr. 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   1. Right mastectomy 08/29/2015 at [place] (Dr.Singh): IDC with invol of skin and skin ulceration, breast capsule inv cancer, superior medial margin positive, 1/1 LN positive, grade 3, 14.3 cm, 3.5 cm, ALI, chest wall involv, ER 90-100%, PR 80-90%, HER-2 negative, Ki 67 40-50% T4CN1 (St 3B) 2. Patient started tamoxifen but developed severe headache with severe hypertension and tamoxifen was discontinued 03/17/2016.  3. 02/2017 started on Exemestane, Zoladex started 05/29/2017 4.  Hospitalization for paraplegia: T9 vertebral body compression status post decompressive laminectomy and tumor resection 01/04/2020 5.  Palliative radiation to the thoracic spine 01/28/2020 6.  Ibrance with letrozole started April 2021 discontinued 07/24/2021 7.  Enhertu started 07/31/2021 every 3 weeks   Assessment: Infection of Malignant Chest Wall Wound, Fungating Mass Signs of systemic infection with fever, tachycardia and worsening pain and wound drainage have improved on broad spectrum abx Vanc, cefipime and flagyl. CT scan unfortunately confirms deeper disease and involvement of the bone structures of her chest wall and worsening ulcerations and overall interval progression of her cancer including a new liver lesion.  Antibiotic Regimen outlined by ID Appreciate Radiation Oncology consultation, Mollie Germany wants to keep her appt at [facility] with rad onc for [facility] tx although Dr, Sondra Come is concerned that her extensive issues may not be amenable to this intervention and has made recs for standard treatment. Continue current drg changes- getting supplies has been an issue for her will ask TOC to help. She would like to start Enhertu and Dr. Lindi Adie would like to start first tx on Monday. I am concerned about bleeding areas of her wound-this will need ongoing monitoring and careful attention to wound care.  2. Cancer related pain Oxycontin 15 BID and  use Morphine 70m q2prn for breakthrough pain.  She has responded very well to this regimen- will continue. Will  transition to oral morphine for breakthrough at discharge.   3. Malnutrition, dehydration Low albumin, thin, clinically appeared dry on admission- now taking PO well.  Appreciate Nutrition recs-she asking for a nutritional supplement that she got in rad onc-will try to provide for her if possible. Boost Mint Peach oral supplement.     Code Status: Full code will have ongoing conversations with her about this. CPR given her chest wall would not be recommended or helpful to her in the event of cardiac arrest given her metastatic disease. DVT Prophylaxis: She has tolerated Lovenox in past- will start this today for DVT prophylaxis-may need to continue this given extensive disease and hx of VTE Family Communication: Patient has full capacity Disposition Plan: She will need either a very high level of in home care and support or an assisited living type facility for care eventually- she is motivated to care for herself but has significant needs.She is not a candidate for CIR or SNF. She will get cancer tx every 3 weeks here in [place] get Rad tx at [facility] depending on outcome of her visit there on 12/19. She has made arrangements to live with her brother in [place] is a better arrangement that where she lives in [place].She is working on transportation issues- she believes that she can go to her brothers home after her tx on Monday and can make arrangements to do that.  She is almost ready for discharge once we ensure that she has a safe plan, that we can get her wound care supplies and as long as she is tolerating her antibiotic regimen will plan on discharging her no later than Monday AM so she can make her appt in the cancer center at [facility] for tx. I will send her discharge prescriptions to the [facility] outpatient pharmacy.  Appreciate TOC helping with discharge plan- she is a [place] resident and  has applied for [place] Medicaid-apparently approved but they are waiting on records to be sent or some other administrative need-may be helpful if we can get financial counselor to assist. She has a very limited income and will need to plan now for her near-future care needs.      Greater than 50%  of this time was spent counseling and coordinating care related to the above assessment and plan.  Lane Hacker, DO  Please contact Palliative Medicine Team phone at [phone] for questions and concerns.

## 2021-10-12 NOTE — Progress Notes (Signed)
Nutrition Follow-up  DOCUMENTATION CODES:   Non-severe (moderate) malnutrition in context of chronic illness  INTERVENTION:   -Prosource Plus PO QID, each provides 100 kcals and 15g protein  NUTRITION DIAGNOSIS:   Moderate Malnutrition related to chronic illness, cancer and cancer related treatments as evidenced by percent weight loss, moderate muscle depletion.  Ongoing.  GOAL:   Patient will meet greater than or equal to 90% of their needs  Progressing.  MONITOR:   PO intake, Supplement acceptance, Labs, Weight trends, I & O's, Skin   ASSESSMENT:   50 y.o. 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 She was seen in the outpatient cancer center palliative clinic today and was noted to have fever and possible wound infection. She has been failing at home and unable to care for herself and doing her own wound care.. She has progressive metastatic cancer on Ibrance.  Patient currently consuming 0-100% of meals. Pt accepting all Prosource supplements. Will increase to QID.   Admission weight: 120 lbs. No other weights this admission.  Medications: Miralax, KLOR-CON, Senokot, Thiamine  Labs reviewed:  Low K  Diet Order:   Diet Order             Diet regular Room service appropriate? Yes; Fluid consistency: Thin  Diet effective now                   EDUCATION NEEDS:   Education needs have been addressed  Skin:  Skin Assessment: Skin Integrity Issues: Skin Integrity Issues:: Other (Comment) Other: right breast wound  Last BM:  12/6  Height:   Ht Readings from Last 1 Encounters:  10/04/21 5\' 5"  (1.651 m)    Weight:   Wt Readings from Last 1 Encounters:  10/04/21 54.5 kg    BMI:  Body mass index is 20 kg/m.  Estimated Nutritional  Needs:   Kcal:  1850-2050  Protein:  85-100g  Fluid:  2L/day  Clayton Bibles, MS, RD, LDN Inpatient Clinical Dietitian Contact information available via Amion

## 2021-10-13 LAB — CBC
HCT: 29.3 % — ABNORMAL LOW (ref 36.0–46.0)
Hemoglobin: 9.4 g/dL — ABNORMAL LOW (ref 12.0–15.0)
MCH: 29.7 pg (ref 26.0–34.0)
MCHC: 32.1 g/dL (ref 30.0–36.0)
MCV: 92.7 fL (ref 80.0–100.0)
Platelets: 319 10*3/uL (ref 150–400)
RBC: 3.16 MIL/uL — ABNORMAL LOW (ref 3.87–5.11)
RDW: 16.2 % — ABNORMAL HIGH (ref 11.5–15.5)
WBC: 7 10*3/uL (ref 4.0–10.5)
nRBC: 0 % (ref 0.0–0.2)

## 2021-10-13 LAB — BASIC METABOLIC PANEL
Anion gap: 6 (ref 5–15)
BUN: 10 mg/dL (ref 6–20)
CO2: 28 mmol/L (ref 22–32)
Calcium: 8.8 mg/dL — ABNORMAL LOW (ref 8.9–10.3)
Chloride: 104 mmol/L (ref 98–111)
Creatinine, Ser: 0.62 mg/dL (ref 0.44–1.00)
GFR, Estimated: >60 mL/min (ref >60)
Glucose, Bld: 90 mg/dL (ref 70–99)
Potassium: 3.9 mmol/L (ref 3.5–5.1)
Sodium: 138 mmol/L (ref 135–145)

## 2021-10-13 MED ORDER — METRONIDAZOLE 0.75 % EX GEL
Freq: Two times a day (BID) | CUTANEOUS | Status: DC
Start: 2021-10-13 — End: 2021-10-16
  Filled 2021-10-13 (×2): qty 45

## 2021-10-13 MED ORDER — CELECOXIB 200 MG PO CAPS
200.0000 mg | ORAL_CAPSULE | Freq: Every day | ORAL | Status: DC
  Administered 2021-10-13 – 2021-10-15 (×3): 200 mg via ORAL
  Filled 2021-10-13 (×4): qty 1

## 2021-10-13 MED ORDER — MORPHINE SULFATE 30 MG PO TABS
30.0000 mg | ORAL_TABLET | ORAL | Status: DC | PRN
  Administered 2021-10-15 – 2021-10-16 (×2): 30 mg via ORAL
  Filled 2021-10-13 (×2): qty 1

## 2021-10-13 NOTE — Progress Notes (Signed)
Wound care done by patient herself. All supplies needed for dressing change provided by Colletta [place], Therapist, sports.

## 2021-10-13 NOTE — Progress Notes (Signed)
Daily Progress Note   Patient Name: Misty Arnold       Date: 10/13/2021 DOB: 03/15/1971  Age: 50 y.o. MRN#: [id number] Attending Physician: Acquanetta Chain, DO Primary Care Physician: Nicholas Lose, MD Admit Date: 10/03/2021  Reason for Consultation/Follow-up: Pain control  Subjective: Pain worse last PM in transitioning her to oral regimen. Her pain was mostly in her chest wall and it kept her from sleeping well.  Length of Stay: 10  Current Medications: Scheduled Meds:  . (feeding supplement) PROSource Plus  30 mL Oral TID PC & HS  . atenolol  50 mg Oral BID  . celecoxib  200 mg Oral QHS  . cholecalciferol  5,000 Units Oral Daily  . enoxaparin (LOVENOX) injection  40 mg Subcutaneous Q24H  . levofloxacin  750 mg Oral Daily  . metroNIDAZOLE   Topical BID  . oxyCODONE  15 mg Oral Q12H  . polyethylene glycol  17 g Oral Daily  . potassium chloride  20 mEq Oral BID  . pregabalin  50 mg Oral QHS  . senna-docusate  1 tablet Oral QHS  . sodium chloride flush  3 mL Intravenous Q12H  . thiamine  100 mg Oral Daily    Continuous Infusions: . sodium chloride Stopped (10/11/21 1503)    PRN Meds: diazepam, lip balm, morphine, morphine injection  Physical Exam          Vital Signs: BP (!) 158/96 (BP Location: Left Arm)   Pulse 92   Temp 98.2 F (36.8 C) (Oral)   Resp 16   Ht 5' 5"  (1.651 m)   Wt 54.5 kg   SpO2 96%   BMI 20.00 kg/m  SpO2: SpO2: 96 % O2 Device: O2 Device: Room Air O2 Flow Rate:    Intake/output summary:  Intake/Output Summary (Last 24 hours) at 10/13/2021 1602 Last data filed at 10/13/2021 0439 Gross per 24 hour  Intake 1062.33 ml  Output --  Net 1062.33 ml    LBM: Last BM Date: 10/11/21 Baseline Weight: Weight: 54.5 kg Most recent weight:  Weight: 54.5 kg    Patient Active Problem List   Diagnosis Date Noted  . Wound due to malignant neoplastic disease ([place]) 10/12/2021  . Malnutrition of moderate degree 10/05/2021  . Breast wound 08/28/2021  . Infected wound 08/26/2021  . Essential hypertension 08/26/2021  .  Wound infection 05/12/2021  . Cancer related pain 05/12/2021  . AKI (acute kidney injury) ([place]) 05/11/2021  . Palliative care patient 02/15/2020  . Myelopathy ([place]) 01/08/2020  . Incomplete paraplegia ([place]) 01/08/2020  . Epidural mass   . Metastatic breast cancer ([place])   . Weakness of both legs   . Leucocytosis   . Acute blood loss anemia   . Neurogenic bladder   . Neurogenic bowel   . S/P laminectomy 01/04/2020  . 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 Breast cancer of upper-outer quadrant of right female breast ([place]) 11/02/2014    Palliative Care Assessment & Plan   Patient Profile: Misty Arnold is a 50 y.o. 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 She was seen in the outpatient cancer center palliative clinic today and was noted to have fever and possible wound infection. She has been failing at home and unable to care for herself and doing her own wound care. She has progressive metastatic cancer on Ibrance.  Cancer history (from Dr. 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   1. Right mastectomy 08/29/2015 at [facility] (Dr.Singh): IDC with invol of skin and skin  ulceration, breast capsule inv cancer, superior medial margin positive, 1/1 LN positive, grade 3, 14.3 cm, 3.5 cm, ALI, chest wall involv, ER 90-100%, PR 80-90%, HER-2 negative, Ki 67 40-50% T4CN1 (St 3B) 2. Patient started tamoxifen but developed severe headache with severe hypertension and tamoxifen was discontinued 03/17/2016.  3. 02/2017 started on Exemestane, Zoladex started 05/29/2017 4.  Hospitalization for paraplegia: T9 vertebral body compression status post decompressive laminectomy and tumor resection 01/04/2020 5.  Palliative radiation to the thoracic spine 01/28/2020 6.  Ibrance with letrozole started April 2021 discontinued 07/24/2021 7.  Enhertu started 07/31/2021 every 3 weeks   Assessment: Infection of Malignant Chest Wall Wound, Fungating Mass Signs of systemic infection with fever, tachycardia and worsening pain and wound drainage have improved on broad spectrum abx Vanc, cefipime and flagyl. Switched to oritavancin and levofloxacin yesterday. CT scan unfortunately confirmed deeper disease and involvement of the bone structures of her chest wall and worsening ulcerations and overall interval progression of her cancer including a new liver lesion.  Antibiotic Regimen outlined by [facility] consultation, Mollie Germany wants to keep her appt at [facility] with rad onc for [facility] tx although Dr, Sondra Come is concerned that her extensive issues may not be amenable to this intervention and has made recs for standard treatment. Continue current drg changes- getting supplies has been an issue for her will ask TOC to help. She would like to start Enhertu and Dr. Lindi Adie would like to start first tx on Monday. I am concerned about bleeding areas of her wound-this will need ongoing monitoring and careful attention to wound care.  2. Cancer related pain Oxycontin 15 BID and use Morphine 58m q2prn for breakthrough pain.  She has responded very well to this regimen- will continue. Will  transition to oral morphine for breakthrough at discharge. Increased MSIR to [id number] hours prn, added on Celebrex QHS for more  anti-inflammatory  pain control.   3. Malnutrition, dehydration Low albumin, thin, clinically appeared dry on admission- now taking PO well.  Appreciate Nutrition recs-she asking for a nutritional supplement that she got in rad onc-will try to provide for her if possible. Boost Mint Peach oral supplement.     Code Status: Full code will have ongoing conversations with her about this. CPR given her chest wall would not be recommended or helpful to her in the event of cardiac arrest given her metastatic disease. We discussed her prognosis in detail today and she is motivated to pursue treatment now. DVT Prophylaxis: She has tolerated Lovenox in past- will start this today for DVT prophylaxis-may need to continue this given extensive disease and hx of VTE Family Communication: Patient has full capacity Disposition Plan: She will need either a very high level of in home care and support or an assisited living type facility for care eventually- she is motivated to care for herself but has significant needs.She is not a candidate for CIR or SNF. She will get cancer tx every 3 weeks here in [place] get Rad tx at [facility] depending on outcome of her visit there on 12/19. She has made arrangements to live with her brother in [place] is a better arrangement that where she lives in [place].She is working on transportation issues- she believes that she can go to her brothers home after her tx on Monday and can make arrangements to do that.  She is almost ready for discharge once we ensure that she has a safe plan, that we can get her wound care supplies and as long as she is tolerating her antibiotic regimen will plan on discharging her no later than Monday AM so she can make her appt in the cancer center at [facility] for tx. I will send her discharge prescriptions to the [facility] outpatient  pharmacy.  Appreciate TOC helping with discharge plan- she is a [place] resident and has applied for [place] Medicaid-apparently approved but they are waiting on records to be sent or some other administrative need-may be helpful if we can get financial counselor to assist. She has a very limited income and will need to plan now for her near-future care needs.      Greater than 50%  of this time was spent counseling and coordinating care related to the above assessment and plan.  Lane Hacker, DO  Please contact Palliative Medicine Team phone at [phone] for questions and concerns.

## 2021-10-13 NOTE — Progress Notes (Signed)
   10/13/21 1200  Mobility  Activity Ambulated in hall  Level of Assistance Standby assist, set-up cues, supervision of patient - no hands on  Assistive Device Four point cane  Distance Ambulated (ft) 500 ft  Mobility Ambulated with assistance in hallway  Mobility Response Tolerated well  Mobility performed by Mobility specialist  $Mobility charge 1 Mobility   Pt was agreeable to mobilize this morning. Ambulated about 573ft with 4pt cane in hall, tolerated well. No complaints. Left pt in bed with call bell at side. RN notified of session.  [place] Specialist Acute Rehab Services Office: [phone]

## 2021-10-14 ENCOUNTER — Inpatient Hospital Stay (HOSPITAL_COMMUNITY): Payer: Medicare PPO

## 2021-10-14 DIAGNOSIS — C801 Malignant (primary) neoplasm, unspecified: Secondary | ICD-10-CM | POA: Diagnosis not present

## 2021-10-14 DIAGNOSIS — R5081 Fever presenting with conditions classified elsewhere: Secondary | ICD-10-CM | POA: Diagnosis not present

## 2021-10-14 DIAGNOSIS — T148XXA Other injury of unspecified body region, initial encounter: Secondary | ICD-10-CM | POA: Diagnosis not present

## 2021-10-14 DIAGNOSIS — G893 Neoplasm related pain (acute) (chronic): Secondary | ICD-10-CM | POA: Diagnosis not present

## 2021-10-14 LAB — CBC WITH DIFFERENTIAL/PLATELET
Abs Immature Granulocytes: 0.04 10*3/uL (ref 0.00–0.07)
Basophils Absolute: 0.1 10*3/uL (ref 0.0–0.1)
Basophils Relative: 1 %
Eosinophils Absolute: 0 10*3/uL (ref 0.0–0.5)
Eosinophils Relative: 1 %
HCT: 30.6 % — ABNORMAL LOW (ref 36.0–46.0)
Hemoglobin: 9.6 g/dL — ABNORMAL LOW (ref 12.0–15.0)
Immature Granulocytes: 1 %
Lymphocytes Relative: 10 %
Lymphs Abs: 0.8 10*3/uL (ref 0.7–4.0)
MCH: 29.3 pg (ref 26.0–34.0)
MCHC: 31.4 g/dL (ref 30.0–36.0)
MCV: 93.3 fL (ref 80.0–100.0)
Monocytes Absolute: 0.7 10*3/uL (ref 0.1–1.0)
Monocytes Relative: 9 %
Neutro Abs: 6.5 10*3/uL (ref 1.7–7.7)
Neutrophils Relative %: 78 %
Platelets: 339 10*3/uL (ref 150–400)
RBC: 3.28 MIL/uL — ABNORMAL LOW (ref 3.87–5.11)
RDW: 16.3 % — ABNORMAL HIGH (ref 11.5–15.5)
WBC: 8.2 10*3/uL (ref 4.0–10.5)
nRBC: 0 % (ref 0.0–0.2)

## 2021-10-14 LAB — URINALYSIS, ROUTINE W REFLEX MICROSCOPIC
Bacteria, UA: NONE SEEN
Bilirubin Urine: NEGATIVE
Glucose, UA: NEGATIVE mg/dL
Hgb urine dipstick: NEGATIVE
Ketones, ur: NEGATIVE mg/dL
Leukocytes,Ua: NEGATIVE
Nitrite: NEGATIVE
Protein, ur: 30 mg/dL — AB
Specific Gravity, Urine: 1.019 (ref 1.005–1.030)
pH: 6 (ref 5.0–8.0)

## 2021-10-14 MED ORDER — IOHEXOL 350 MG/ML SOLN
75.0000 mL | Freq: Once | INTRAVENOUS | Status: AC | PRN
  Administered 2021-10-14: 60 mL via INTRAVENOUS

## 2021-10-14 MED ORDER — METRONIDAZOLE 500 MG/100ML IV SOLN
500.0000 mg | Freq: Two times a day (BID) | INTRAVENOUS | Status: DC
  Administered 2021-10-14 – 2021-10-16 (×5): 500 mg via INTRAVENOUS
  Filled 2021-10-14 (×4): qty 100

## 2021-10-14 MED ORDER — SODIUM CHLORIDE 0.9 % IV SOLN
2.0000 g | Freq: Three times a day (TID) | INTRAVENOUS | Status: DC
  Administered 2021-10-14 – 2021-10-16 (×6): 2 g via INTRAVENOUS
  Filled 2021-10-14 (×7): qty 2

## 2021-10-14 MED ORDER — ACETAMINOPHEN 325 MG PO TABS
650.0000 mg | ORAL_TABLET | Freq: Four times a day (QID) | ORAL | Status: DC | PRN
  Administered 2021-10-14: 650 mg via ORAL
  Filled 2021-10-14: qty 2

## 2021-10-14 NOTE — Plan of Care (Signed)

## 2021-10-14 NOTE — Progress Notes (Signed)
Pharmacy Antibiotic Note  Misty Arnold is a 50 y.o. female with a h/o metastatic breast cancer admitted on 10/03/2021 with infection of malignant chest wall wound.  Pharmacy has been consulted to resume cefepime dosing.  Plan: Cefepime 2 g iv q 8 hours  Oritavancin administered 12/7  Will sign off and follow remotely  Height: 5\' 5"  (165.1 cm) Weight: 54.5 kg (120 lb 3.2 oz) IBW/kg (Calculated) : 57  Temp (24hrs), Avg:99.6 F (37.6 C), Min:97.9 F (36.6 C), Max:101.1 F (38.4 C)  Recent Labs  Lab 10/08/21 0547 10/09/21 2259 10/10/21 0533 10/10/21 2003 10/12/21 0909 10/13/21 1345 10/14/21 0620  WBC 5.9  --   --   --  5.7 7.0 8.2  CREATININE 0.62  --  0.67  --  0.64 0.62  --   VANCOTROUGH  --   --   --  <4*  --   --   --   VANCOPEAK  --  32  --   --   --   --   --     Estimated Creatinine Clearance: 70.8 mL/min (by C-G formula based on SCr of 0.62 mg/dL).    Allergies  Allergen Reactions   Amoxicillin     Other reaction(s): rash/itching   Penicillins Rash    Did it involve swelling of the face/tongue/throat, SOB, or low BP? no Did it involve sudden or severe rash/hives, skin peeling, or any reaction on the inside of your mouth or nose? Yes Did you need to seek medical attention at a hospital or doctor's office? No When did it last happen? early 20's     If all above answers are "NO", may proceed with cephalosporin use.   Pork-Derived Products    Penicillin G    Tamoxifen Hypertension    Thank you for allowing pharmacy to be a part of this patient's care.  Misty Arnold 10/14/2021 11:48 AM

## 2021-10-14 NOTE — Progress Notes (Signed)
Daily Progress Note   Patient Name: Misty Arnold       Date: 10/14/2021 DOB: [birth year]  Age: 50 y.o. MRN#: [id number] Attending Physician: Acquanetta Chain, DO Primary Care Physician: Nicholas Lose, MD Admit Date: 10/03/2021  Reason for Consultation/Follow-up: Pain control  Subjective: Patient is awake alert sitting up in bed, a gentleman who she states is her caregiver is also present at the bedside, patient's nurse Vikki Ports is also in the room. Alerted by Vikki Ports earlier this morning that the patient had a temperature of 101.1 F.   Patient states that her pain level is about a 3 out of 10 right now, she did not rest well overnight, had pain discomfort and possible "swelling" on her right upper back area overnight.   Length of Stay: 11  Current Medications: Scheduled Meds:  . (feeding supplement) PROSource Plus  30 mL Oral TID PC & HS  . atenolol  50 mg Oral BID  . celecoxib  200 mg Oral QHS  . cholecalciferol  5,000 Units Oral Daily  . enoxaparin (LOVENOX) injection  40 mg Subcutaneous Q24H  . levofloxacin  750 mg Oral Daily  . metroNIDAZOLE   Topical BID  . oxyCODONE  15 mg Oral Q12H  . polyethylene glycol  17 g Oral Daily  . potassium chloride  20 mEq Oral BID  . pregabalin  50 mg Oral QHS  . senna-docusate  1 tablet Oral QHS  . sodium chloride flush  3 mL Intravenous Q12H  . thiamine  100 mg Oral Daily    Continuous Infusions: . sodium chloride Stopped (10/11/21 1503)  . metronidazole      PRN Meds: acetaminophen, diazepam, lip balm, morphine, morphine injection  Physical Exam         Patient is awake alert sitting up in bed Regular work of breathing Diminished breath sounds towards bases right worse than left S1-S2 Chest wall dressing and patient states  she is doing her own wound care Patient has slight warmth and mild localized swelling over mid thoracic area on the right upper back. Awake alert oriented, no focal deficits.  Vital Signs: BP (!) 147/87 (BP Location: Left Arm)   Pulse 96   Temp 97.9 F (36.6 C)   Resp 17   Ht 5' 5"  (1.651 m)   Wt 54.5 kg  SpO2 94%   BMI 20.00 kg/m  SpO2: SpO2: 94 % O2 Device: O2 Device: Room Air O2 Flow Rate:    Intake/output summary:  Intake/Output Summary (Last 24 hours) at 10/14/2021 1059 Last data filed at 10/13/2021 2122 Gross per 24 hour  Intake 480 ml  Output --  Net 480 ml    LBM: Last BM Date: 10/13/21 Baseline Weight: Weight: 54.5 kg Most recent weight: Weight: 54.5 kg    Patient Active Problem List   Diagnosis Date Noted  . Wound due to malignant neoplastic disease ([place]) 10/12/2021  . Malnutrition of moderate degree 10/05/2021  . Breast wound 08/28/2021  . Infected wound 08/26/2021  . Essential hypertension 08/26/2021  . Wound infection 05/12/2021  . Cancer related pain 05/12/2021  . AKI (acute kidney injury) ([place]) 05/11/2021  . Palliative care patient 02/15/2020  . Myelopathy ([place]) 01/08/2020  . Incomplete paraplegia ([place]) 01/08/2020  . Epidural mass   . Metastatic breast cancer ([place])   . Weakness of both legs   . Leucocytosis   . Acute blood loss anemia   . Neurogenic bladder   . Neurogenic bowel   . S/P laminectomy 01/04/2020  . 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 Breast cancer of upper-outer quadrant of right female breast ([place]) 11/02/2014    Palliative Care Assessment & Plan   Patient Profile: Misty Arnold is a 50 y.o. 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 She was seen in the outpatient cancer center palliative  clinic today and was noted to have fever and possible wound infection. She has been failing at home and unable to care for herself and doing her own wound care. She has progressive metastatic cancer on Ibrance.  Cancer history (from Dr. 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   1. Right mastectomy 08/29/2015 at [place] (Dr.Singh): IDC with invol of skin and skin ulceration, breast capsule inv cancer, superior medial margin positive, 1/1 LN positive, grade 3, 14.3 cm, 3.5 cm, ALI, chest wall involv, ER 90-100%, PR 80-90%, HER-2 negative, Ki 67 40-50% T4CN1 (St 3B) 2. Patient started tamoxifen but developed severe headache with severe hypertension and tamoxifen was discontinued 03/17/2016.  3. 02/2017 started on Exemestane, Zoladex started 05/29/2017 4.  Hospitalization for paraplegia: T9 vertebral body compression status post decompressive laminectomy and tumor resection 01/04/2020 5.  Palliative radiation to the thoracic spine 01/28/2020 6.  Ibrance with letrozole started April 2021 discontinued 07/24/2021 7.  Enhertu started 07/31/2021 every 3 weeks   Assessment: Infection of Malignant Chest Wall Wound, Fungating Mass CT scan of the chest showing right loculated pleural effusion from 12 -1, patient with fever 101.1 F this morning. P.o. Tylenol given. Infectious disease work-up initiated: Urine analysis and blood cultures. Will repeat CT scan of the chest with contrast to compare to the CT scan done on 12-1  Discussed with colleague, primary attending Dr. Hilma Favors.  Discontinue oral Levaquin and resume broad-spectrum antibiotics until further fever work-up results are available.  Briefly  discussed via secure chat message with Dr. Juleen China from infectious disease as well.  Continue wound care and current pain regimen.     3. Malnutrition, dehydration Continue to encourage p.o. intake and monitor.   Code Status: Full code for now.  DVT Prophylaxis: Lovenox 40 mg sub cutaneous every 24 hours.  Family Communication: Patient has full capacity Disposition Plan: Patient to undergo fever work-up over the weekend and will likely be considered for discharge early next week.     Greater than 50%  of this time was spent counseling and coordinating care related to the above assessment and plan.  Loistine Chance, MD  Please contact Palliative Medicine Team phone at [phone] for questions and concerns.

## 2021-10-14 NOTE — Progress Notes (Signed)
   10/14/21 1200  Mobility  Activity Ambulated in hall  Level of Assistance Standby assist, set-up cues, supervision of patient - no hands on  Assistive Device Four point cane  Distance Ambulated (ft) 400 ft  Mobility Ambulated with assistance in hallway  Mobility Response Tolerated well  Mobility performed by Mobility specialist  $Mobility charge 1 Mobility   Pt agreeable to mobilize this morning. Pt up in bathroom freshening up upon entering. She ambulated about 412ft in hall with 4pt cane, tolerated well. No complaints. Pt asked for her bed to be changed secondary to her fever and sweating last night. Changed pt's sheets. Left pt in chair with call bell at side and family member present. RN notified of session.  [place] Specialist Acute Rehab Services Office: [phone]

## 2021-10-15 DIAGNOSIS — G893 Neoplasm related pain (acute) (chronic): Secondary | ICD-10-CM | POA: Diagnosis not present

## 2021-10-15 DIAGNOSIS — Z515 Encounter for palliative care: Secondary | ICD-10-CM | POA: Diagnosis not present

## 2021-10-15 DIAGNOSIS — C50411 Malignant neoplasm of upper-outer quadrant of right female breast: Secondary | ICD-10-CM | POA: Diagnosis not present

## 2021-10-15 DIAGNOSIS — C7949 Secondary malignant neoplasm of other parts of nervous system: Secondary | ICD-10-CM | POA: Diagnosis not present

## 2021-10-15 NOTE — Progress Notes (Addendum)
Daily Progress Note   Patient Name: Misty Arnold       Date: 10/15/2021 DOB: 11/08/1970  Age: 50 y.o. MRN#: [id number] Attending Physician: Acquanetta Chain, DO Primary Care Physician: Nicholas Lose, MD Admit Date: 10/03/2021  Reason for Consultation/Follow-up: Pain control  Subjective: Chart Reviewed. Patient is awake, alert, and oriented. She is sitting up in bed eating. Her boyfriend is at the bedside.   Misty Arnold had febrile episode on yesterday. She has been started back on IV antibiotics. States she does not feel as though she has had additional fevers, however had an episode during the night of sweating saturating the bed and her clothes. This required linen and clothes to be change.   She reports her pain is 2 out of 10 and she has not required much IV pain medication. The "swelling" to her right upper back is improved however she does have some swelling to the right forearm area with tight shiny skin. Denies pain when assessed but does endorse tight feeling. No recent blood draws or vital signs in this particular arm. Discussed team will continue to evaluate with concerns for possible superficial blood clot vs lymphedema.   Chest x-ray showed pleural effusions. Denies shortness of breath. Will continue to evaluate and follow-up with Dr. Hilma Favors tomorrow.   Misty Arnold continues to remain hopeful for some improvement and the initiation of cancer related therapies.   All questions answered and support provided.    Length of Stay: 12  Current Medications: Scheduled Meds:  . (feeding supplement) PROSource Plus  30 mL Oral TID PC & HS  . atenolol  50 mg Oral BID  . celecoxib  200 mg Oral QHS  . cholecalciferol  5,000 Units Oral Daily  . enoxaparin (LOVENOX) injection  40 mg  Subcutaneous Q24H  . metroNIDAZOLE   Topical BID  . oxyCODONE  15 mg Oral Q12H  . polyethylene glycol  17 g Oral Daily  . potassium chloride  20 mEq Oral BID  . pregabalin  50 mg Oral QHS  . senna-docusate  1 tablet Oral QHS  . sodium chloride flush  3 mL Intravenous Q12H  . thiamine  100 mg Oral Daily    Continuous Infusions: . sodium chloride Stopped (10/11/21 1503)  . ceFEPime (MAXIPIME) IV 2 g (10/15/21 0254)  . metronidazole 500 mg (10/15/21  0637)    PRN Meds: acetaminophen, diazepam, lip balm, morphine, morphine injection  Physical Exam         Patient is awake alert sitting up in bed RRR Diminished bilaterally Chest wall dressing and binder intact Patient has slight warmth and mild localized swelling over mid thoracic area on the right upper back. Somewhat improved. Swelling to right forearm area Awake alert oriented, no focal deficits.  Vital Signs: BP 123/85   Pulse 80   Temp 97.7 F (36.5 C) (Oral)   Resp 20   Ht 5' 5" (1.651 m)   Wt 54.5 kg   SpO2 96%   BMI 20.00 kg/m  SpO2: SpO2: 96 % O2 Device: O2 Device: Room Air O2 Flow Rate:    Intake/output summary:  Intake/Output Summary (Last 24 hours) at 10/15/2021 0947 Last data filed at 10/15/2021 0400 Gross per 24 hour  Intake 1580 ml  Output --  Net 1580 ml    LBM: Last BM Date: 10/13/21 Baseline Weight: Weight: 54.5 kg Most recent weight: Weight: 54.5 kg    Patient Active Problem List   Diagnosis Date Noted  . Wound due to malignant neoplastic disease ([place]) 10/12/2021  . Malnutrition of moderate degree 10/05/2021  . Breast wound 08/28/2021  . Infected wound 08/26/2021  . Essential hypertension 08/26/2021  . Wound infection 05/12/2021  . Cancer related pain 05/12/2021  . AKI (acute kidney injury) ([place]) 05/11/2021  . Palliative care patient 02/15/2020  . Myelopathy ([place]) 01/08/2020  . Incomplete paraplegia ([place]) 01/08/2020  . Epidural mass   . Metastatic breast cancer ([place])   . Weakness  of both legs   . Leucocytosis   . Acute blood loss anemia   . Neurogenic bladder   . Neurogenic bowel   . S/P laminectomy 01/04/2020  . 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 Breast cancer of upper-outer quadrant of right female breast ([place]) 11/02/2014    Palliative Care Assessment & Plan   Patient Profile: Misty Arnold is a 50 y.o. 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 She was seen in the outpatient cancer center palliative clinic today and was noted to have fever and possible wound infection. She has been failing at home and unable to care for herself and doing her own wound care. She has progressive metastatic cancer on Ibrance.  Cancer history (from Dr. 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   1. Right mastectomy 08/29/2015 at [facility] [facility] (Dr.Singh): IDC with invol of skin and skin ulceration, breast capsule inv cancer, superior medial margin positive, 1/1 LN positive, grade 3, 14.3 cm, 3.5 cm, ALI, chest wall involv, ER 90-100%, PR 80-90%, HER-2 negative, Ki 67 40-50% T4CN1 (St 3B) 2. Patient started tamoxifen but developed severe headache with severe hypertension and tamoxifen was discontinued 03/17/2016.  3. 02/2017 started on Exemestane, Zoladex started 05/29/2017 4.  Hospitalization for paraplegia: T9 vertebral body compression status post decompressive laminectomy and tumor resection 01/04/2020 5.  Palliative radiation to the thoracic spine  01/28/2020 6.  Ibrance with letrozole started April 2021 discontinued 07/24/2021 7.  Enhertu started 07/31/2021 every 3 weeks   Assessment: Infection of Malignant Chest Wall Wound, Fungating Mass CT scan of the chest showing right loculated pleural effusion from 12 -1, patient with fever 101.1 F this morning. P.o. Tylenol given. Infectious disease work-up initiated: Urine analysis and blood cultures. Will repeat CT scan of the chest with contrast to compare to the CT scan done on 12-1  Discussed with colleague, primary attending Dr. Hilma Favors.  Discontinue oral Levaquin and resume broad-spectrum antibiotics until further fever work-up results are available.  Briefly discussed via secure chat message with Dr. Juleen China from infectious disease as well. Continue wound care and current pain regimen.     3. Malnutrition, dehydration Continue to encourage p.o. intake and monitor.   Code Status: Full code for now.  DVT Prophylaxis: Lovenox 40 mg sub cutaneous every 24 hours.  Family Communication: Patient has full capacity Disposition Plan: Patient to undergo fever work-up over the weekend and will likely be considered for discharge early next week.  Time Total: 35 min.   Visit consisted of counseling and education dealing with the complex and emotionally intense issues of symptom management and palliative care in the setting of serious and potentially life-threatening illness.Greater than 50%  of this time was spent counseling and coordinating care related to the above assessment and plan.  [name], AGPCNP-BC  Palliative Medicine Team [phone]

## 2021-10-16 ENCOUNTER — Inpatient Hospital Stay: Payer: Medicare PPO | Admitting: Adult Health

## 2021-10-16 ENCOUNTER — Other Ambulatory Visit (HOSPITAL_COMMUNITY): Payer: Self-pay

## 2021-10-16 ENCOUNTER — Inpatient Hospital Stay: Payer: Medicare PPO

## 2021-10-16 ENCOUNTER — Encounter: Payer: Self-pay | Admitting: Hematology and Oncology

## 2021-10-16 ENCOUNTER — Inpatient Hospital Stay: Payer: Medicare PPO | Attending: Hematology and Oncology

## 2021-10-16 DIAGNOSIS — Z5112 Encounter for antineoplastic immunotherapy: Secondary | ICD-10-CM | POA: Insufficient documentation

## 2021-10-16 DIAGNOSIS — Z170 Estrogen receptor positive status [ER+]: Secondary | ICD-10-CM | POA: Insufficient documentation

## 2021-10-16 DIAGNOSIS — Z79899 Other long term (current) drug therapy: Secondary | ICD-10-CM | POA: Insufficient documentation

## 2021-10-16 DIAGNOSIS — C773 Secondary and unspecified malignant neoplasm of axilla and upper limb lymph nodes: Secondary | ICD-10-CM | POA: Insufficient documentation

## 2021-10-16 DIAGNOSIS — C50411 Malignant neoplasm of upper-outer quadrant of right female breast: Secondary | ICD-10-CM | POA: Insufficient documentation

## 2021-10-16 DIAGNOSIS — C7951 Secondary malignant neoplasm of bone: Secondary | ICD-10-CM | POA: Insufficient documentation

## 2021-10-16 LAB — BASIC METABOLIC PANEL
Anion gap: 8 (ref 5–15)
BUN: 10 mg/dL (ref 6–20)
CO2: 24 mmol/L (ref 22–32)
Calcium: 8.6 mg/dL — ABNORMAL LOW (ref 8.9–10.3)
Chloride: 108 mmol/L (ref 98–111)
Creatinine, Ser: 0.64 mg/dL (ref 0.44–1.00)
GFR, Estimated: >60 mL/min (ref >60)
Glucose, Bld: 124 mg/dL — ABNORMAL HIGH (ref 70–99)
Potassium: 3.6 mmol/L (ref 3.5–5.1)
Sodium: 140 mmol/L (ref 135–145)

## 2021-10-16 LAB — CBC
HCT: 29.4 % — ABNORMAL LOW (ref 36.0–46.0)
Hemoglobin: 9.3 g/dL — ABNORMAL LOW (ref 12.0–15.0)
MCH: 29.1 pg (ref 26.0–34.0)
MCHC: 31.6 g/dL (ref 30.0–36.0)
MCV: 91.9 fL (ref 80.0–100.0)
Platelets: 408 10*3/uL — ABNORMAL HIGH (ref 150–400)
RBC: 3.2 MIL/uL — ABNORMAL LOW (ref 3.87–5.11)
RDW: 16.2 % — ABNORMAL HIGH (ref 11.5–15.5)
WBC: 5.9 10*3/uL (ref 4.0–10.5)
nRBC: 0 % (ref 0.0–0.2)

## 2021-10-16 MED ORDER — SILVER HYDROGEL EX GEL
CUTANEOUS | 0 refills | Status: DC
  Filled 2021-10-16: qty 44.4, 15d supply, fill #0

## 2021-10-16 MED ORDER — POTASSIUM CHLORIDE CRYS ER 20 MEQ PO TBCR
20.0000 meq | EXTENDED_RELEASE_TABLET | Freq: Every day | ORAL | 0 refills | Status: DC
Start: 2021-10-16 — End: 2022-06-06
  Filled 2021-10-16: qty 30, 30d supply, fill #0

## 2021-10-16 MED ORDER — PROSOURCE PLUS PO LIQD: 30.0000 mL | Freq: Three times a day (TID) | ORAL | Status: DC

## 2021-10-16 MED ORDER — ATENOLOL 50 MG PO TABS
50.0000 mg | ORAL_TABLET | Freq: Two times a day (BID) | ORAL | 3 refills | Status: DC
  Filled 2021-10-16: qty 60, 30d supply, fill #0
  Filled 2021-11-15: qty 60, 30d supply, fill #1

## 2021-10-16 MED ORDER — DIAZEPAM 5 MG PO TABS
5.0000 mg | ORAL_TABLET | Freq: Two times a day (BID) | ORAL | 0 refills | Status: DC | PRN
  Filled 2021-10-16: qty 60, 30d supply, fill #0

## 2021-10-16 MED ORDER — ENOXAPARIN SODIUM 40 MG/0.4ML IJ SOSY
40.0000 mg | PREFILLED_SYRINGE | INTRAMUSCULAR | 0 refills | Status: DC
Start: 2021-10-16 — End: 2022-09-10
  Filled 2021-10-16: qty 24, 60d supply, fill #0

## 2021-10-16 MED ORDER — PREGABALIN 50 MG PO CAPS
50.0000 mg | ORAL_CAPSULE | Freq: Every day | ORAL | 0 refills | Status: DC
  Filled 2021-10-16: qty 30, 30d supply, fill #0

## 2021-10-16 MED ORDER — MORPHINE SULFATE 30 MG PO TABS
30.0000 mg | ORAL_TABLET | ORAL | 0 refills | Status: DC | PRN
  Filled 2021-10-16: qty 120, 20d supply, fill #0

## 2021-10-16 MED ORDER — POLYETHYLENE GLYCOL 3350 17 G PO PACK
17.0000 g | PACK | Freq: Every day | ORAL | 0 refills | Status: DC
Start: 2021-10-16 — End: 2024-05-22
  Filled 2021-10-16: qty 30, 30d supply, fill #0

## 2021-10-16 MED ORDER — METRONIDAZOLE 500 MG PO TABS
500.0000 mg | ORAL_TABLET | Freq: Two times a day (BID) | ORAL | 0 refills | Status: AC
  Filled 2021-10-16: qty 60, 30d supply, fill #0

## 2021-10-16 MED ORDER — METRONIDAZOLE 0.75 % EX GEL: Freq: Two times a day (BID) | CUTANEOUS | 0 refills | Status: DC

## 2021-10-16 MED ORDER — LEVOFLOXACIN 750 MG PO TABS
750.0000 mg | ORAL_TABLET | Freq: Every day | ORAL | 0 refills | Status: AC
  Filled 2021-10-16: qty 30, 30d supply, fill #0

## 2021-10-16 MED ORDER — LIP MEDEX EX OINT
1.0000 | TOPICAL_OINTMENT | CUTANEOUS | 0 refills | Status: DC | PRN
Start: 2021-10-16 — End: 2023-04-02

## 2021-10-16 MED ORDER — CELECOXIB 200 MG PO CAPS
200.0000 mg | ORAL_CAPSULE | Freq: Every day | ORAL | 3 refills | Status: DC
  Filled 2021-10-16: qty 30, 30d supply, fill #0
  Filled 2021-11-15: qty 30, 30d supply, fill #1

## 2021-10-16 MED ORDER — MULTI-VITAMIN/MINERALS PO TABS: ORAL_TABLET | ORAL | Status: DC

## 2021-10-16 MED ORDER — ENOXAPARIN SODIUM 40 MG/0.4ML IJ SOSY
40.0000 mg | PREFILLED_SYRINGE | INTRAMUSCULAR | 1 refills | Status: DC
  Filled 2021-10-16: qty 30, 75d supply, fill #0

## 2021-10-16 MED ORDER — VITAMIN D3 125 MCG (5000 UT) PO CAPS: 5000.0000 [IU] | ORAL_CAPSULE | Freq: Every day | ORAL | 3 refills | Status: DC

## 2021-10-16 MED ORDER — XTAMPZA ER 9 MG PO C12A
9.0000 mg | EXTENDED_RELEASE_CAPSULE | Freq: Two times a day (BID) | ORAL | 0 refills | Status: DC
  Filled 2021-10-16: qty 60, 30d supply, fill #0

## 2021-10-16 MED ORDER — ACETAMINOPHEN 500 MG PO TABS: 1000.0000 mg | ORAL_TABLET | Freq: Four times a day (QID) | ORAL | Status: DC | PRN

## 2021-10-16 NOTE — Progress Notes (Signed)
Discussed discharge plan in detail with patient this morning. Will discharge today so that she can make her treatment appt in the cancer center. She has had no fever, feels well and we have a plan for outpatient care and access to our team if her condition changes.   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 Sodium Hypochlorite Cleanser Silver Gel Calcium AG drgs, ABDs Metronidazole Gel in wound bed  Lane Hacker, DO Palliative Medicine

## 2021-10-16 NOTE — Progress Notes (Addendum)
[name] to be D/C'd Home per MD order.  Discussed with the patient and all questions fully answered.  IV catheter discontinued intact. Site without signs and symptoms of complications. Dressing and pressure applied.  An After Visit Summary was printed and given to the patient. Patient prescriptions sent to pharmacy. Patient provided with dressing supplies.  D/c education completed with patient/family including follow up instructions, medication list, d/c activities limitations if indicated, with other d/c instructions as indicated by MD - patient able to verbalize understanding, all questions fully answered. Outpatient appointment for today, rescheduled for tomorrow.  Patient instructed to return to ED, call 911, or call MD for any changes in condition.   Patient escorted via [place], and D/C home via private auto.  Manuella Ghazi 10/16/2021 4:49 PM

## 2021-10-16 NOTE — Progress Notes (Signed)
Per Dr. Lindi Adie, ok to proceed w/ Enhertu w/ ECHO from 2018.  Kennith Center, Pharm.D., CPP 10/16/2021@1 :59 PM

## 2021-10-16 NOTE — Discharge Summary (Signed)
Physician Discharge Summary  Patient ID: Misty Arnold MRN: [id number] DOB/AGE: 52/08/1971 52 y.o.  Admit date: 10/03/2021 Discharge date: 10/16/2021  Admission Diagnoses: Possible Sepsis, Fever, Tachycardia, Hypotension Discharge Diagnoses:  Principal Problem:   Wound due to malignant neoplastic disease ([place]) Active Problems:   Breast cancer of upper-outer quadrant of right female breast ([place])   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)   Epidural mass   Myelopathy ([facility])   Incomplete paraplegia ([place])   Palliative care patient   Cancer related pain   Infected wound   Essential hypertension   Malnutrition of moderate degree   Discharged Condition: stable  Hospital Course:  Patient was admitted with malignant wound infection and signs of systemic infection with fever, tachycardia, hypotension and dehydration. Broad spectrum antibiotics were started Vancomycin, Cefipime, and Flagyl. Aggressive IV hydration was initiated. Advanced wound care provided including cleansing with dakins solution, application of Alginate dressings. She also had intractable pain from bone metastatic disease and spinal cord involvement- her pain regimen was titrated to an oral dose for discharge home. Radiation oncology was consulted who recommended treatment to control ulcerations and bleeding of the wound bed. Med-Onc also plans to start outpatient systemic treatment soon after discharge  Consults: ID and hematology/oncology  Significant Diagnostic Studies:  CT-scan of the chest, CT Staging   Treatments:   Discharge Exam: Blood pressure 115/77, pulse 97, temperature 98.6 F (37 C), temperature source Oral, resp. rate 14, height 5\' 5"  (1.651 m), weight 54.5 kg, SpO2 97 %.  Discussed discharge plan in detail with patient this morning. Will discharge today so that she can make her treatment appt in the cancer center. She has had no fever, feels well and we have a plan for outpatient care and access  to our team if her condition changes.    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 Sodium Hypochlorite Cleanser Silver Gel Calcium AG drgs, ABDs Metronidazole Gel in wound bed Disposition: Discharge disposition: 01-Home or Self Care       Discharge Instructions     Call MD for:   Complete by: As directed    Call MD for:  difficulty breathing, headache or visual disturbances   Complete by: As directed    Call MD for:  hives   Complete by: As directed    Call MD for:  persistant dizziness or light-headedness   Complete by: As directed    Call MD for:  persistant nausea and vomiting   Complete by: As directed    Call MD for:  redness, tenderness, or signs of infection (pain, swelling, redness, odor or green/yellow discharge around incision site)   Complete by: As directed    Call MD for:  severe uncontrolled pain   Complete by: As directed    Call MD for:  temperature >100.4   Complete by: As directed    Diet - low sodium heart healthy   Complete by: As directed    Discharge instructions   Complete by: As directed    Begin Tx in [place] Today. Follow-up  with Palliative Care in 1 week-either in person or virtual.   Discharge wound care:   Complete by: As directed    Continue self managed wound care. Calcium AG drg, clean with Skin Smart or Dakins solution (Sodium Hypoclorite solution), cover with ABD, may use flagyl gel or silver wound gel in wound bed.   Increase  activity slowly   Complete by: As directed       Allergies as of 10/16/2021       Reactions   Amoxicillin    Other reaction(s): rash/itching   Penicillins Rash   Did it involve swelling of the face/tongue/throat, SOB, or low BP? no Did it involve sudden or severe rash/hives, skin peeling, or any reaction on the inside of your mouth or nose? Yes Did you need to seek medical attention at a hospital or doctor's office? No When did it last happen? early 20's     If all above answers are "NO", may proceed with cephalosporin use.   Pork-derived Products    Penicillin G    Tamoxifen Hypertension        Medication List     STOP taking these medications    amLODipine 10 MG tablet Commonly known as: NORVASC   bisacodyl 10 MG suppository Commonly known as: DULCOLAX   dexamethasone 1 MG tablet Commonly known as: DECADRON   goserelin 3.6 MG injection Commonly known as: ZOLADEX   Ibrance 125 MG tablet Generic drug: palbociclib   letrozole 2.5 MG tablet Commonly known as: FEMARA   ondansetron 4 MG disintegrating tablet Commonly known as: Zofran ODT   oxyCODONE 15 mg 12 hr tablet Commonly known as: OXYCONTIN   Oxycodone HCl 10 MG Tabs   Oyster Shell Calcium w/D 500-200 MG-UNIT Tabs       TAKE these medications    (feeding supplement) PROSource Plus liquid Take 30 mLs by mouth 4 (four) times daily - after meals and at bedtime.   acetaminophen 500 MG tablet Commonly known as: TYLENOL Take 2 tablets (1,000 mg total) by mouth every 6 (six) hours as needed for fever.   atenolol 50 MG tablet Commonly known as: TENORMIN Take 1 tablet (50 mg total) by mouth 2 (two) times daily.   celecoxib 200 MG capsule Commonly known as: CELEBREX Take 1 capsule (200 mg total) by mouth at bedtime.   diazepam 5 MG tablet Commonly known as: VALIUM Take 1 tablet (5 mg total) by mouth every 12 (twelve) hours as needed for anxiety or muscle spasms (sleep). What changed: reasons to  take this   enoxaparin 40 MG/0.4ML injection Commonly known as: LOVENOX Inject 0.4 mLs (40 mg total) into the skin daily.   levofloxacin 750 MG tablet Commonly known as: Levaquin Take 1 tablet (750 mg total) by mouth daily.   lidocaine-prilocaine cream Commonly known as: EMLA APPLY TOPICALLY AS NEEDED. What changed:  how much to take how to take this when to take this additional instructions   lip balm ointment Apply 1 application topically as needed for lip care (cold sores).   metroNIDAZOLE 0.75 % gel Commonly known as: METROGEL Apply topically 2 (two) times daily.   metroNIDAZOLE 500 MG tablet Commonly known as: FLAGYL Take 1 tablet (500 mg total) by mouth 2 (two) times daily. (no alcohol)   morphine 30 MG tablet Commonly known as: MSIR Take 1 tablet (30 mg total) by mouth every 4 (four) hours as needed for severe pain.   multivitamin with minerals tablet FLORADIX Supplement-Iron +minerals   polyethylene  glycol 17 g packet Commonly known as: MIRALAX / GLYCOLAX Take 17 g by mouth daily. What changed:  when to take this reasons to take this   potassium chloride SA 20 MEQ tablet Commonly known as: KLOR-CON M Take 1 tablet (20 mEq total) by mouth daily.   pregabalin 50 MG capsule Commonly known as: LYRICA Take 1 capsule (50 mg total) by mouth at bedtime.   Silver Hydrogel Gel Apply to wound bed as directed with each dressing change.   VITAMIN C PO Take 1,000 mg by mouth daily.   Vitamin D3 125 MCG (5000 UT) Caps Take 1 capsule (5,000 Units total) by mouth daily. What changed: See the new instructions.   Xtampza ER 9 MG C12a Generic drug: oxyCODONE ER Take 9 mg by mouth 2 (two) times daily.               Discharge Care Instructions  (From admission, onward)           Start     Ordered   10/16/21 0000  Discharge wound care:       Comments: Continue self managed wound care. Calcium AG drg, clean with Skin Smart or Dakins solution (Sodium  Hypoclorite solution), cover with ABD, may use flagyl gel or silver wound gel in wound bed.   10/16/21 1129             Signed: Lane Hacker 10/16/2021, 11:31 AM

## 2021-10-16 NOTE — Progress Notes (Signed)
Mobility Specialist - Progress Note    10/16/21 1437  Mobility  Activity Ambulated in hall  Level of Assistance Independent after set-up  Assistive Device Four point cane  Distance Ambulated (ft) 600 ft  Mobility Ambulated independently in hallway  Mobility Response Tolerated well  Mobility performed by Mobility specialist  $Mobility charge 1 Mobility   Upon entry pt was agreeable to mobilize and required no assistance to stand at EOB. Pt used four point cane to ambulate ~600 ft in hallway and reported no pain, dizziness, or SOB. Pt returned to sitting at EOB after session and was left in room with call bell at side and RN in room.   [place] Specialist Acute Rehabilitation Services Phone: [phone] 10/16/21, 2:38 PM

## 2021-10-17 ENCOUNTER — Inpatient Hospital Stay: Payer: Medicare PPO

## 2021-10-17 ENCOUNTER — Other Ambulatory Visit (HOSPITAL_COMMUNITY): Payer: Self-pay

## 2021-10-17 ENCOUNTER — Other Ambulatory Visit: Payer: Self-pay

## 2021-10-17 VITALS — BP 157/97 | HR 100 | Temp 98.7°F | Resp 18 | Wt 129.8 lb

## 2021-10-17 DIAGNOSIS — Z5112 Encounter for antineoplastic immunotherapy: Secondary | ICD-10-CM | POA: Diagnosis not present

## 2021-10-17 DIAGNOSIS — C7949 Secondary malignant neoplasm of other parts of nervous system: Secondary | ICD-10-CM

## 2021-10-17 DIAGNOSIS — C50411 Malignant neoplasm of upper-outer quadrant of right female breast: Secondary | ICD-10-CM

## 2021-10-17 DIAGNOSIS — Z170 Estrogen receptor positive status [ER+]: Secondary | ICD-10-CM | POA: Diagnosis not present

## 2021-10-17 DIAGNOSIS — C50919 Malignant neoplasm of unspecified site of unspecified female breast: Secondary | ICD-10-CM

## 2021-10-17 DIAGNOSIS — Z79899 Other long term (current) drug therapy: Secondary | ICD-10-CM | POA: Diagnosis not present

## 2021-10-17 DIAGNOSIS — C773 Secondary and unspecified malignant neoplasm of axilla and upper limb lymph nodes: Secondary | ICD-10-CM | POA: Diagnosis not present

## 2021-10-17 DIAGNOSIS — C7951 Secondary malignant neoplasm of bone: Secondary | ICD-10-CM | POA: Diagnosis not present

## 2021-10-17 MED ORDER — DEXTROSE 5 % IV SOLN: Freq: Once | INTRAVENOUS | Status: AC

## 2021-10-17 MED ORDER — FAM-TRASTUZUMAB DERUXTECAN-NXKI CHEMO 100 MG IV SOLR
5.4000 mg/kg | Freq: Once | INTRAVENOUS | Status: AC
  Administered 2021-10-17: 294 mg via INTRAVENOUS
  Filled 2021-10-17: qty 14.7

## 2021-10-17 MED ORDER — DIPHENHYDRAMINE HCL 25 MG PO CAPS
25.0000 mg | ORAL_CAPSULE | Freq: Once | ORAL | Status: AC
  Administered 2021-10-17: 25 mg via ORAL
  Filled 2021-10-17: qty 1

## 2021-10-17 MED ORDER — HYDROMORPHONE HCL 1 MG/ML IJ SOLN
2.0000 mg | Freq: Once | INTRAMUSCULAR | Status: AC
  Administered 2021-10-17: 2 mg via INTRAVENOUS
  Filled 2021-10-17: qty 2

## 2021-10-17 MED ORDER — ACETAMINOPHEN 325 MG PO TABS
650.0000 mg | ORAL_TABLET | Freq: Once | ORAL | Status: AC
  Administered 2021-10-17: 650 mg via ORAL
  Filled 2021-10-17: qty 2

## 2021-10-17 MED ORDER — SODIUM CHLORIDE 0.9 % IV SOLN
10.0000 mg | Freq: Once | INTRAVENOUS | Status: AC
  Administered 2021-10-17: 10 mg via INTRAVENOUS
  Filled 2021-10-17: qty 10

## 2021-10-17 MED ORDER — PALONOSETRON HCL INJECTION 0.25 MG/5ML
0.2500 mg | Freq: Once | INTRAVENOUS | Status: AC
  Administered 2021-10-17: 0.25 mg via INTRAVENOUS
  Filled 2021-10-17: qty 5

## 2021-10-17 NOTE — Patient Instructions (Signed)
[facility]  Discharge Instructions: °Thank you for choosing [facility] to provide your oncology and hematology care.  ° °If you have a lab appointment with the Cancer Center, please go directly to the Cancer Center and check in at the registration area. °  °Wear comfortable clothing and clothing appropriate for easy access to any Portacath or PICC line.  ° °We strive to give you quality time with your provider. You may need to reschedule your appointment if you arrive late (15 or more minutes).  Arriving late affects you and other patients whose appointments are after yours.  Also, if you miss three or more appointments without notifying the office, you may be dismissed from the clinic at the provider’s discretion.    °  °For prescription refill requests, have your pharmacy contact our office and allow 72 hours for refills to be completed.   ° °Today you received the following chemotherapy and/or immunotherapy agents Enhertu    °  °To help prevent nausea and vomiting after your treatment, we encourage you to take your nausea medication as directed. ° °BELOW ARE SYMPTOMS THAT SHOULD BE REPORTED IMMEDIATELY: °*FEVER GREATER THAN 100.4 F (38 °C) OR HIGHER °*CHILLS OR SWEATING °*NAUSEA AND VOMITING THAT IS NOT CONTROLLED WITH YOUR NAUSEA MEDICATION °*UNUSUAL SHORTNESS OF BREATH °*UNUSUAL BRUISING OR BLEEDING °*URINARY PROBLEMS (pain or burning when urinating, or frequent urination) °*BOWEL PROBLEMS (unusual diarrhea, constipation, pain near the anus) °TENDERNESS IN MOUTH AND THROAT WITH OR WITHOUT PRESENCE OF ULCERS (sore throat, sores in mouth, or a toothache) °UNUSUAL RASH, SWELLING OR PAIN  °UNUSUAL VAGINAL DISCHARGE OR ITCHING  ° °Items with * indicate a potential emergency and should be followed up as soon as possible or go to the Emergency Department if any problems should occur. ° °Please show the CHEMOTHERAPY ALERT CARD or IMMUNOTHERAPY ALERT CARD at check-in to the  Emergency Department and triage nurse. ° °Should you have questions after your visit or need to cancel or reschedule your appointment, please contact [facility]  Dept: [phone]  and follow the prompts.  Office hours are 8:00 a.m. to 4:30 p.m. Monday - Friday. Please note that voicemails left after 4:00 p.m. may not be returned until the following business day.  We are closed weekends and major holidays. You have access to a nurse at all times for urgent questions. Please call the main number to the clinic Dept: [phone] and follow the prompts. ° ° °For any non-urgent questions, you may also contact your provider using MyChart. We now offer e-Visits for anyone 18 and older to request care online for non-urgent symptoms. For details visit [email or web address]. °  °Also download the MyChart app! Go to the app store, search "MyChart", open the app, select , and log in with your MyChart username and password. ° °Due to Covid, a mask is required upon entering the hospital/clinic. If you do not have a mask, one will be given to you upon arrival. For doctor visits, patients may have 1 support person aged 18 or older with them. For treatment visits, patients cannot have anyone with them due to current Covid guidelines and our immunocompromised population.  ° °Fam-Trastuzumab deruxtecan injection °What is this medication? °TRASTUZUMAB DERUXTECAN (tras TOOZ eu mab DER ux TEE kan) is a chemotherapy medicine and a monoclonal antibody. It treats certain types of cancer. Some of the cancers treated are breast cancer and gastric cancer. °This medicine may be used for other   purposes; ask your health care provider or pharmacist if you have questions. °COMMON BRAND NAME(S): ENHERTU °What should I tell my care team before I take this medication? °They need to know if you have any of these conditions: °heart disease °heart failure °infection (especially a virus infection such as  chickenpox, cold sores, or herpes) °liver disease °lung or breathing disease, like asthma °an unusual or allergic reaction to fam-trastuzumab deruxtecan, other medications, foods, dyes, or preservatives °pregnant or trying to get pregnant °breast-feeding °How should I use this medication? °This medicine is for infusion into a vein. It is given by a health care professional in a hospital or clinic setting. °Talk to your pediatrician regarding the use of this medicine in children. Special care may be needed. °Overdosage: If you think you have taken too much of this medicine contact a poison control center or emergency room at once. °NOTE: This medicine is only for you. Do not share this medicine with others. °What if I miss a dose? °It is important not to miss your dose. Call your doctor or health care professional if you are unable to keep an appointment. °What may interact with this medication? °Interaction studies have not been performed. °This list may not describe all possible interactions. Give your health care provider a list of all the medicines, herbs, non-prescription drugs, or dietary supplements you use. Also tell them if you smoke, drink alcohol, or use illegal drugs. Some items may interact with your medicine. °What should I watch for while using this medication? °Visit your healthcare professional for regular checks on your progress. Tell your healthcare professional if your symptoms do not start to get better or if they get worse. °Your condition will be monitored carefully while you are receiving this medicine. °Do not become pregnant while taking this medicine or for 7 months after stopping it. Women should inform their healthcare professional if they wish to become pregnant or think they might be pregnant. Men should not father a child while taking this medicine and for 4 months after stopping it. There is potential for serious side effects to an unborn child. Talk to your healthcare professional  for more information. °Do not breast-feed an infant while taking this medicine or for 7 months after the last dose. °This medicine has caused decreased sperm counts in some men. This may make it more difficult to father a child. Talk to your healthcare professional if you are concerned about your fertility. °This medicine may increase your risk to bruise or bleed. Call your health care professional if you notice any unusual bleeding. °Be careful brushing or flossing your teeth or using a toothpick because you may get an infection or bleed more easily. If you have any dental work done, tell your dentist you are receiving this medicine. °This medicine may cause dry eyes [and blurred vision]. If you wear contact lenses, you may feel some discomfort. Lubricating eye drops may help. See your healthcare professional if the problem does not go away or is severe. °Call your healthcare professional for advice if you get a fever, chills, or sore throat, or other symptoms of a cold or flu. Do not treat yourself. This medicine decreases your body's ability to fight infections. Try to avoid being around people who are sick. °Avoid taking medicines that contain aspirin, acetaminophen, ibuprofen, naproxen, or ketoprofen unless instructed by your healthcare professional. These medicines may hide a fever. °What side effects may I notice from receiving this medication? °Side effects that you should report   to your doctor or health care professional as soon as possible: °allergic reactions like skin rash, itching or hives, swelling of the face, lips, or tongue °breathing problems °cough °nausea, vomiting °signs and symptoms of bleeding such as bloody or black, tarry stools; red or dark-brown urine; spitting up blood or brown material that looks like coffee grounds; red spots on the skin; unusual bruising or bleeding from the eye, gums, or nose °signs and symptoms of heart failure like breathing problems, fast, irregular heartbeat,  sudden weight gain; swelling of the ankles, feet, hands; unusually weak or tired °signs and symptoms of infection like fever; chills; cough; sore throat; pain or trouble passing urine °signs and symptoms of low red blood cells or anemia such as unusually weak or tired; feeling faint or lightheaded; falls; breathing problems °Side effects that usually do not require medical attention (report these to your doctor or health care professional if they continue or are bothersome): °constipation °diarrhea °dry eyes °hair loss °loss of appetite °mouth sores °rash °This list may not describe all possible side effects. Call your doctor for medical advice about side effects. You may report side effects to FDA at 1-800-FDA-1088. °Where should I keep my medication? °This drug is given in a hospital or clinic and will not be stored at home. °NOTE: This sheet is a summary. It may not cover all possible information. If you have questions about this medicine, talk to your doctor, pharmacist, or health care provider. °© 2022 [employer] (2021-07-11 00:00:00) ° ° °

## 2021-10-17 NOTE — Progress Notes (Signed)
Per Dr Lindi Adie ok to treat with AST 84 from 10/12/2021

## 2021-10-18 ENCOUNTER — Telehealth: Payer: Self-pay

## 2021-10-18 NOTE — Telephone Encounter (Signed)
Pt called and states she was wanting to speak with Dr Hilma Favors about other concerns. This call was deferred to Jarrett Soho, RN for f/u who states she will call pt.

## 2021-10-19 LAB — CULTURE, BLOOD (ROUTINE X 2)
Culture: NO GROWTH
Culture: NO GROWTH
Special Requests: ADEQUATE

## 2021-10-20 ENCOUNTER — Telehealth: Payer: Self-pay

## 2021-10-20 NOTE — Telephone Encounter (Signed)
Ms. Tat called to notify us of a "knot" on her chest that she stated she could feel fluid around. She stated she has had this before and that it isn't bothering her. She also called to give [name] the number of her Medicaid case worker to fax information to her. I will follow up with this. She also asked if transportation services would be able to pick her up in [place]. I told her that they wouldn't be able to go that far and I asked if she needed to change her appt time on 1/3. She said we could leave it the same for now and she will follow up with her brother. All questions answered. Understanding verbalized. Advised to call back with any questions/concerns.

## 2021-10-23 ENCOUNTER — Ambulatory Visit: Payer: Medicare PPO

## 2021-10-23 DIAGNOSIS — C50911 Malignant neoplasm of unspecified site of right female breast: Secondary | ICD-10-CM | POA: Diagnosis not present

## 2021-10-23 DIAGNOSIS — C7951 Secondary malignant neoplasm of bone: Secondary | ICD-10-CM | POA: Diagnosis not present

## 2021-10-23 DIAGNOSIS — S21109A Unspecified open wound of unspecified front wall of thorax without penetration into thoracic cavity, initial encounter: Secondary | ICD-10-CM | POA: Diagnosis not present

## 2021-10-23 DIAGNOSIS — C7949 Secondary malignant neoplasm of other parts of nervous system: Secondary | ICD-10-CM | POA: Diagnosis not present

## 2021-10-23 DIAGNOSIS — C7989 Secondary malignant neoplasm of other specified sites: Secondary | ICD-10-CM | POA: Diagnosis not present

## 2021-10-23 DIAGNOSIS — Z170 Estrogen receptor positive status [ER+]: Secondary | ICD-10-CM | POA: Diagnosis not present

## 2021-10-23 DIAGNOSIS — C787 Secondary malignant neoplasm of liver and intrahepatic bile duct: Secondary | ICD-10-CM | POA: Diagnosis not present

## 2021-10-23 DIAGNOSIS — C50311 Malignant neoplasm of lower-inner quadrant of right female breast: Secondary | ICD-10-CM | POA: Diagnosis not present

## 2021-10-24 ENCOUNTER — Inpatient Hospital Stay: Payer: Medicare PPO

## 2021-10-25 ENCOUNTER — Telehealth: Payer: Self-pay | Admitting: Radiation Oncology

## 2021-10-25 NOTE — Telephone Encounter (Signed)
Received request for dosimetry records from [facility] via fax 12/21. Placed request on cart to go to dosimetry.

## 2021-10-27 ENCOUNTER — Telehealth: Payer: Self-pay | Admitting: Licensed Clinical Social Worker

## 2021-10-27 ENCOUNTER — Telehealth: Payer: Self-pay | Admitting: Nurse Practitioner

## 2021-10-27 ENCOUNTER — Telehealth: Payer: Self-pay | Admitting: General Practice

## 2021-10-27 NOTE — Telephone Encounter (Signed)
Patient called to update medical team that she had a consult at [facility] who recommended continued treatment plan that she is currently on per Dr. Lindi Adie. Also agrees with recommended radiation treatments as previously discussed.   [facility] states her brother is going out of town and she is in need of a hotel to stay for the weekend of 12/31-1/3 as she is worried she cannot make her appointment and being closer will allow her the ability to make the appointment. She has left a message with Webb Silversmith, LCSW also.   Pain is well controlled. No concerns with bowel movement 2/day.   [facility] states she intends to continue to her care here at [facility] and does not intend on transferring her care to [facility].   We will plan to see her back on 1/3 in collaboration with her other oncology appointments.   Elder Love, NP-BC  Palliative

## 2021-10-27 NOTE — Telephone Encounter (Signed)
[place] CSW Progress Note  Clinical Education officer, museum  received VM from patient  requesting lodging assistance for appt on 11/07/20 as her family will be out of town and they normally assist with getting her to appts.  Attempted to call patient back to review options. No answer. Left VM with information for [place] lodging assistance [phone]) which can provide free hotel stays for cancer-related treatment. Left my direct contact information if patient has any questions.    Christeen Douglas , LCSW

## 2021-10-27 NOTE — Telephone Encounter (Signed)
[place] CSW Progress Notes  Two messages from patient, she is requesting help finding a "place to stay for the weekend" when she needs to come to [facility] approx 11/07/21 for appointments.  As this is a patient of Dr Lindi Adie, message forwarded to [place].  Edwyna Shell, LCSW Clinical Social Worker Phone:  [phone]

## 2021-10-31 ENCOUNTER — Telehealth: Payer: Self-pay

## 2021-10-31 NOTE — Telephone Encounter (Signed)
[facility] called our office to update [name] on how she is doing. She stated that her appetite has been lower and she is experiencing very dry skin ("sandpaper skin"). She also stated that she spoke to [facility] and clarified that she wasn't transferring her care over there. [name], NP notified. Clarified [name]'s appts next week on 1/3. She stated she was in the process of setting up an extended stay for lodging. I informed her that we could look at medication management and her skin when she comes in next week. Understanding and agreeance verbalized. All questions answered.

## 2021-11-01 ENCOUNTER — Telehealth: Payer: Self-pay | Admitting: Licensed Clinical Social Worker

## 2021-11-01 NOTE — Telephone Encounter (Signed)
[place] Work  Pt called and stated that she was able to get a hotel stay for the night before her treatment from [employer]. She also needs to come a couple of days earlier (when her brother is able to bring her) and asked about other options for those nights.  Provided information on [facility] which partners with hotel chains for discounts for cancer pts. She will look at the options and book her stay with that discount.  No other needs at this time.   Christeen Douglas, LCSW

## 2021-11-02 ENCOUNTER — Telehealth: Payer: Self-pay | Admitting: *Deleted

## 2021-11-02 NOTE — Telephone Encounter (Signed)
This RN spoke with pt per her call inquiring about " the cooling cap system and would that be of benefit for hair loss with current treatment."  This RN discussed Digni cap with need for pt to order supplies and out of pocket cost as well as issues relating to cost and expectations which most women continue to have hair loss and often are not pleased with the outcome.  This RN informed of process of use.  [facility] stated understanding including " then it does decrease the circulation and that is a concern " as well as honesty in discussion including the out of pocket cost which may not bring the wanted benefits.  This RN validated her concern with hair loss and again informed of availability if she wants to pursue the use.

## 2021-11-06 NOTE — Progress Notes (Signed)
[place] [facility] Follow up:    Misty Lose, MD [place] [address]   DIAGNOSIS:  Cancer Staging  Breast cancer of upper-outer quadrant of right female breast [facility]) Staging form: Breast, AJCC 7th Edition - Clinical: Stage IIIB (T4b, N0, M0) - Unsigned   SUMMARY OF ONCOLOGIC HISTORY: Oncology History  Breast cancer of upper-outer quadrant of right female breast ([place])  09/21/2010 Mammogram   right breast linear and segmental pleomorphic calcifications from 4:00 to 6:00 position ultrasound revealed 1.6 cm and 1.1 cm masses   09/27/2010 Initial Biopsy   ultrasound-guided biopsy of all masses showed DCIS grade 2; patient went to cancer treatment centers of [place] for second opinion and delayed therapy   01/26/2011 Surgery   right mastectomy followed by reconstruction: 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 But she could not afford these trips.   01/08/2014 Breast MRI   right breast multiple enhancing masses within the soft tissues largest 5.6 cm in wall skin surface and the capsule of the silicone prosthesis, contiguous nodules involving in inferomedial breast and tired and subcentimeter nodules across the midline   08/29/2015 Surgery   Right mastectomy: IDC with invol of skin and skin ulceration, breast capsule inv cancer, superior medial margin positive, 1/1 LN positive, grade 3, 14.3 cm, 3.5 cm, ALI, chest wall involv, ER 90-100%, PR 80-90%, HER-2 negative, Ki 67 40-50% T4CN1 (St 3B)   03/05/2016 -   Anti-estrogen oral therapy   Tamoxifen 20 mg daily stopped due to headache and uncontrolled hypertension. Decrease to 10 mg daily 04/03/2016, stopped June 2017 and took an estrogen metabolizer over-the-counter; started Aromasin April 2018 from a [place] physician, Zoladex started on 05/2017 and switched to Letrozole in 09/2017   01/04/2020 Surgery   Patient presented to the ED on 01/04/20 for worsening lower extremity numbness and underwent a decompressive laminectomy with tumor resection at T9 that was complicated with acute blood loss anemia and leukocytosis.   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 female returns for evaluation prior to receiving treatment with Enhertu therapy.  She began this treatment on October 17, 2021.  She notes that her main side effect was  feeling tired 2 days after the infusion.  She notes the day after the infusion she felt very good she thinks it is because she received steroids before the treatment.  Misty Arnold continues to manage her pain with Xtampza ER twice a day.   She has morphine sulfate immediate release that she can use for breakthrough pain.  She says her pain is much better controlled and she only needs the morphine 1-2 times per week.  She denies any constipation and notes that the pain medication does not cause increased somnolence.  Misty Arnold has a draining wound in her right chest wall.  She was previously admitted to the hospital for this wound and a wound infection.  The wound does drain and she changes the dressing 2-3 times a day.  She notes that since being discharged on December 12 her wound is much improved.  She continues on Levaquin, Flagyl, and MetroGel topically for the wound.  Misty Arnold lives at home with her brother and sister-in-law in [place].  Her brother brings her to her appointments.  Her most recent echocardiogram was completed on October 10, 2017 and showed a normal left ventricular ejection fraction of 60 to 65%.   Patient Active Problem List   Diagnosis Date Noted   Wound due to malignant neoplastic disease (Misty Arnold) 10/12/2021   Malnutrition of moderate degree 10/05/2021   Breast wound 08/28/2021   Infected wound 08/26/2021   Essential hypertension 08/26/2021   Wound infection 05/12/2021   Cancer related pain 05/12/2021   AKI (acute kidney injury) (Misty Arnold) 05/11/2021   Palliative care patient 02/15/2020   Myelopathy (Misty Arnold) 01/08/2020   Incomplete paraplegia (Misty Arnold) 01/08/2020   Epidural mass    Metastatic breast cancer (HCC)    Weakness of both legs    Leucocytosis    Acute blood loss anemia    Neurogenic bladder    Neurogenic bowel    S/P laminectomy 01/04/2020   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Misty Arnold) 01/04/2020   Breast cancer of upper-outer quadrant of right female breast (Misty Arnold) 11/02/2014    is allergic to amoxicillin, penicillins, pork-derived products, penicillin g, and tamoxifen.  MEDICAL HISTORY: Past Medical History:  Diagnosis Date   Abnormal uterine bleeding    Anemia    Breast  cancer (Misty Arnold)    Depression    Fibroid    Hormone disorder    Hypertension    Infertility, female     SURGICAL HISTORY: Past Surgical History:  Procedure Laterality Date   ABDOMINAL HYSTERECTOMY     BREAST SURGERY     LAMINECTOMY N/A 01/04/2020   Procedure: Thoracic Eight, Thoracic Nine THORACIC LAMINECTOMY FOR TUMOR with Thoracic Seven-Thoracic Ten instrumentation;  Surgeon: [name], MD;  Location: [facility];  Service: Neurosurgery;  Laterality: N/A;  Thoracic Eight, Thoracic Nine THORACIC LAMINECTOMY FOR TUMOR with Thoracic Seven-Thoracic Ten instrumentation   MASTECTOMY     salivary gland stone      SOCIAL HISTORY: Social History   Socioeconomic History   Marital status: Divorced    Spouse name: Not on file   Number of children: Not on file   Years of education: 12   Highest education level: Not on file  Occupational History   Occupation: disabled  Tobacco Use   Smoking status: Never   Smokeless tobacco: Never  Vaping Use   Vaping Use: Never used  Substance and Sexual Activity   Alcohol use: Not Currently   Drug use: No   Sexual activity: Yes    Partners:  Male    Birth control/protection: Surgical, Condom  Other Topics Concern   Not on file  Social History Narrative   Lives at home with her grandmother   Right handed   Caffeine seldom, coffee once or twice monthly   Social Determinants of Health   Financial Resource Strain: Not on file  Food Insecurity: Not on file  Transportation Needs: Not on file  Physical Activity: Not on file  Stress: Not on file  Social Connections: Not on file  Intimate Partner Violence: Not on file    FAMILY HISTORY: Family History  Problem Relation Age of Onset   Stroke Mother    Heart attack Mother    Diabetes Mother    Hyperlipidemia Mother    Heart disease Mother    Diabetes Father    Stroke Father    Stroke Maternal Grandmother    Cancer Maternal Grandfather    Colon cancer Maternal Grandfather    Stroke Maternal  Grandfather    Stroke Paternal Grandmother    Heart attack Paternal Grandmother    Heart failure Paternal Grandmother    Diabetes Paternal Grandmother    Hyperlipidemia Paternal Grandmother    CAD Paternal Grandmother    Cancer Paternal Grandfather    Colon cancer Paternal Grandfather    Cancer Maternal Aunt 27       breast   Stroke Maternal Aunt    Heart attack Maternal Aunt    Breast cancer Maternal Aunt     Review of Systems  Constitutional:  Positive for fatigue. Negative for appetite change, chills, fever and unexpected weight change.  HENT:   Negative for hearing loss, lump/mass and trouble swallowing.   Eyes:  Negative for eye problems and icterus.  Respiratory:  Negative for chest tightness, cough and shortness of breath.   Cardiovascular:  Negative for chest pain, leg swelling and palpitations.  Gastrointestinal:  Negative for abdominal distention, abdominal pain, constipation, diarrhea, nausea and vomiting.  Endocrine: Negative for hot flashes.  Genitourinary:  Negative for difficulty urinating.   Musculoskeletal:  Negative for arthralgias.  Skin:  Negative for itching and rash.  Neurological:  Negative for dizziness, extremity weakness, headaches and numbness.  Hematological:  Negative for adenopathy. Does not bruise/bleed easily.  Psychiatric/Behavioral:  Negative for depression. The patient is not nervous/anxious.      PHYSICAL EXAMINATION  ECOG PERFORMANCE STATUS: 2 - Symptomatic, <50% confined to bed  Vitals:   11/07/21 0846  BP: (!) 160/96  Pulse: 91  Resp: 18  Temp: (!) 97.3 F (36.3 C)  SpO2: 100%    Physical Exam Constitutional:      General: She is not in acute distress.    Appearance: Normal appearance. She is not toxic-appearing.     Comments: Examined in wheelchair  HENT:     Head: Normocephalic and atraumatic.  Eyes:     General: No scleral icterus. Cardiovascular:     Rate and Rhythm: Normal rate and regular rhythm.     Pulses: Normal  pulses.     Heart sounds: Normal heart sounds.  Pulmonary:     Effort: Pulmonary effort is normal.     Breath sounds: Normal breath sounds.  Abdominal:     General: Abdomen is flat. Bowel sounds are normal. There is no distension.     Palpations: Abdomen is soft.     Tenderness: There is no abdominal tenderness.  Musculoskeletal:        General: No swelling.     Cervical back: Neck supple.  Lymphadenopathy:     Cervical: No cervical adenopathy.  Skin:    General: Skin is warm and dry.     Findings: No rash.  Neurological:     General: No focal deficit present.     Mental Status: She is alert.  Psychiatric:        Mood and Affect: Mood normal.        Behavior: Behavior normal.    LABORATORY DATA:  CBC    Component Value Date/Time   WBC 2.7 (L) 11/07/2021 0827   WBC 5.9 10/16/2021 1101   RBC 3.97 11/07/2021 0827   HGB 11.7 (L) 11/07/2021 0827   HGB 13.1 11/01/2017 1417   HCT 36.2 11/07/2021 0827   HCT 40.1 11/01/2017 1417   PLT 388 11/07/2021 0827   PLT 227 11/01/2017 1417   MCV 91.2 11/07/2021 0827   MCV 89.3 11/01/2017 1417   MCH 29.5 11/07/2021 0827   MCHC 32.3 11/07/2021 0827   RDW 17.5 (H) 11/07/2021 0827   RDW 16.0 (H) 11/01/2017 1417   LYMPHSABS 0.8 11/07/2021 0827   LYMPHSABS 2.5 11/01/2017 1417   MONOABS 0.4 11/07/2021 0827   MONOABS 0.7 11/01/2017 1417   EOSABS 0.1 11/07/2021 0827   EOSABS 0.1 11/01/2017 1417   BASOSABS 0.0 11/07/2021 0827   BASOSABS 0.0 11/01/2017 1417    CMP     Component Value Date/Time   NA 140 11/07/2021 0827   NA 141 11/01/2017 1417   K 3.7 11/07/2021 0827   K 3.5 11/01/2017 1417   CL 110 11/07/2021 0827   CO2 23 11/07/2021 0827   CO2 26 11/01/2017 1417   GLUCOSE 122 (H) 11/07/2021 0827   GLUCOSE 81 11/01/2017 1417   BUN 14 11/07/2021 0827   BUN 15.3 11/01/2017 1417   CREATININE 0.80 11/07/2021 0827   CREATININE 0.8 11/01/2017 1417   CALCIUM 9.0 11/07/2021 0827   CALCIUM 8.9 11/01/2017 1417   PROT 7.0 11/07/2021  0827   PROT 6.5 11/01/2017 1417   ALBUMIN 3.7 11/07/2021 0827   ALBUMIN 3.6 11/01/2017 1417   AST 28 11/07/2021 0827   AST 12 11/01/2017 1417   ALT 13 11/07/2021 0827   ALT 14 11/01/2017 1417   ALKPHOS 111 11/07/2021 0827   ALKPHOS 121 11/01/2017 1417   BILITOT 0.3 11/07/2021 0827   BILITOT 0.23 11/01/2017 1417   GFRNONAA >60 11/07/2021 0827   GFRAA >60 08/04/2020 1521          ASSESSMENT and THERAPY PLAN:   Breast cancer of upper-outer quadrant of right female breast ([place]) Recurrent right breast cancer initially DCIS treated with mastectomy followed by reconstruction and later developed chest wall recurrence in 2013 ER/PR positive HER-2 negative, patient underwent alternative therapies in [place] but could not afford the trips and hence she has not been on any breast cancer therapy for a long time.   Treatment summary: 1. Right mastectomy 08/29/2015 at [facility] (Dr.Singh): IDC with invol of skin and skin ulceration, breast capsule inv cancer, superior medial margin positive, 1/1 LN positive, grade 3, 14.3 cm, 3.5 cm, ALI, chest wall involv, ER 90-100%, PR 80-90%, HER-2 negative, Ki 67 40-50% T4CN1 (St 3B) 2. Patient started tamoxifen but developed severe headache with severe hypertension and tamoxifen was discontinued 03/17/2016.  3. 02/2017 started on Exemestane, Zoladex started 05/29/2017 4.  Hospitalization for paraplegia: T9 vertebral body compression status post decompressive laminectomy and tumor resection 01/04/2020 5.  Palliative radiation to the thoracic spine 01/28/2020 6.  Ibrance with letrozole started April 2021  discontinued 07/24/2021 7.  Enhertu started 10/17/2021 every 3 weeks  ----------------------------------------------------------------------------------------------------------------- 05/11/2021: CT angiogram: Metastatic breast cancer soft tissue breast masses greater on the right, axillary supraclavicular lymphadenopathy, multiple bone metastases    Hospitalization,  discharged 10/16/2021:  Worsening right chest wall pain and infection   Treatment plan: Enhertu every 3 weeks   Toxicities: Fatigue on day 2 Mild leukopenia  #1 metastatic breast cancer.  She continues on treatment with Enhertu.  Today she will receive her second dose.  She appears to be tolerating this well.  She needs to undergo an echocardiogram.  Since she has no signs or symptoms of heart failure today or heart concerns she will proceed with Enhertu and we will get this scheduled prior to her next infusion.  2.  Metastatic cancer related pain: This is controlled with Xtampza ER and morphine sulfate immediate release.  She is meeting the goal of pain control with it increasing functionality, and is not experiencing side effects such as constipation and somnolence.  3.  Right chest wall breast wound: This appears to be drying up.  She continues doing dressing changes and using the antibiotics as ordered.  We will see Misty Arnold back in clinic in 3 weeks for labs, follow-up, and her next treatment.   All questions were answered. The patient knows to call the clinic with any problems, questions or concerns. We can certainly see the patient much sooner if necessary.  Total encounter time: 30 minutes in face-to-face visit time, chart review, lab review, care coordination, order entry, and documentation of the encounter.  Wilber Bihari, NP 11/07/21 9:15 AM Medical Oncology and Hematology [facility] [place], [place] [address] Tel. [phone]    Fax. [phone]  *Total Encounter Time as defined by the Centers for Medicare and Medicaid Services includes, in addition to the face-to-face time of a patient visit (documented in the note above) non-face-to-face time: obtaining and reviewing outside history, ordering and reviewing medications, tests or procedures, care coordination (communications with other health care professionals or caregivers) and documentation  in the medical record.

## 2021-11-07 ENCOUNTER — Encounter: Payer: Self-pay | Admitting: Adult Health

## 2021-11-07 ENCOUNTER — Inpatient Hospital Stay (HOSPITAL_BASED_OUTPATIENT_CLINIC_OR_DEPARTMENT_OTHER): Payer: Medicare PPO | Admitting: Adult Health

## 2021-11-07 ENCOUNTER — Other Ambulatory Visit: Payer: Self-pay

## 2021-11-07 ENCOUNTER — Inpatient Hospital Stay: Payer: Medicare PPO

## 2021-11-07 ENCOUNTER — Inpatient Hospital Stay: Payer: Medicare PPO | Attending: Hematology and Oncology

## 2021-11-07 ENCOUNTER — Other Ambulatory Visit (HOSPITAL_COMMUNITY): Payer: Self-pay

## 2021-11-07 ENCOUNTER — Inpatient Hospital Stay (HOSPITAL_BASED_OUTPATIENT_CLINIC_OR_DEPARTMENT_OTHER): Payer: Medicare PPO | Admitting: Nurse Practitioner

## 2021-11-07 VITALS — BP 141/97 | HR 93 | Resp 16

## 2021-11-07 VITALS — BP 160/96 | HR 91 | Temp 97.3°F | Resp 18 | Ht 65.0 in | Wt 122.9 lb

## 2021-11-07 DIAGNOSIS — Z79899 Other long term (current) drug therapy: Secondary | ICD-10-CM | POA: Diagnosis not present

## 2021-11-07 DIAGNOSIS — Z170 Estrogen receptor positive status [ER+]: Secondary | ICD-10-CM

## 2021-11-07 DIAGNOSIS — C50411 Malignant neoplasm of upper-outer quadrant of right female breast: Secondary | ICD-10-CM | POA: Diagnosis not present

## 2021-11-07 DIAGNOSIS — Z515 Encounter for palliative care: Secondary | ICD-10-CM

## 2021-11-07 DIAGNOSIS — C7949 Secondary malignant neoplasm of other parts of nervous system: Secondary | ICD-10-CM

## 2021-11-07 DIAGNOSIS — Z9221 Personal history of antineoplastic chemotherapy: Secondary | ICD-10-CM | POA: Diagnosis not present

## 2021-11-07 DIAGNOSIS — Z9011 Acquired absence of right breast and nipple: Secondary | ICD-10-CM | POA: Insufficient documentation

## 2021-11-07 DIAGNOSIS — C50919 Malignant neoplasm of unspecified site of unspecified female breast: Secondary | ICD-10-CM

## 2021-11-07 DIAGNOSIS — K5900 Constipation, unspecified: Secondary | ICD-10-CM

## 2021-11-07 DIAGNOSIS — Z7189 Other specified counseling: Secondary | ICD-10-CM

## 2021-11-07 DIAGNOSIS — G893 Neoplasm related pain (acute) (chronic): Secondary | ICD-10-CM

## 2021-11-07 DIAGNOSIS — Z5112 Encounter for antineoplastic immunotherapy: Secondary | ICD-10-CM | POA: Diagnosis not present

## 2021-11-07 DIAGNOSIS — Z923 Personal history of irradiation: Secondary | ICD-10-CM | POA: Diagnosis not present

## 2021-11-07 LAB — CMP (CANCER CENTER ONLY)
ALT: 13 U/L (ref 0–44)
AST: 28 U/L (ref 15–41)
Albumin: 3.7 g/dL (ref 3.5–5.0)
Alkaline Phosphatase: 111 U/L (ref 38–126)
Anion gap: 7 (ref 5–15)
BUN: 14 mg/dL (ref 6–20)
CO2: 23 mmol/L (ref 22–32)
Calcium: 9 mg/dL (ref 8.9–10.3)
Chloride: 110 mmol/L (ref 98–111)
Creatinine: 0.8 mg/dL (ref 0.44–1.00)
GFR, Estimated: >60 mL/min (ref >60)
Glucose, Bld: 122 mg/dL — ABNORMAL HIGH (ref 70–99)
Potassium: 3.7 mmol/L (ref 3.5–5.1)
Sodium: 140 mmol/L (ref 135–145)
Total Bilirubin: 0.3 mg/dL (ref 0.3–1.2)
Total Protein: 7 g/dL (ref 6.5–8.1)

## 2021-11-07 LAB — CBC WITH DIFFERENTIAL (CANCER CENTER ONLY)
Abs Immature Granulocytes: 0.01 10*3/uL (ref 0.00–0.07)
Basophils Absolute: 0 10*3/uL (ref 0.0–0.1)
Basophils Relative: 2 %
Eosinophils Absolute: 0.1 10*3/uL (ref 0.0–0.5)
Eosinophils Relative: 3 %
HCT: 36.2 % (ref 36.0–46.0)
Hemoglobin: 11.7 g/dL — ABNORMAL LOW (ref 12.0–15.0)
Immature Granulocytes: 0 %
Lymphocytes Relative: 31 %
Lymphs Abs: 0.8 10*3/uL (ref 0.7–4.0)
MCH: 29.5 pg (ref 26.0–34.0)
MCHC: 32.3 g/dL (ref 30.0–36.0)
MCV: 91.2 fL (ref 80.0–100.0)
Monocytes Absolute: 0.4 10*3/uL (ref 0.1–1.0)
Monocytes Relative: 15 %
Neutro Abs: 1.3 10*3/uL — ABNORMAL LOW (ref 1.7–7.7)
Neutrophils Relative %: 49 %
Platelet Count: 388 10*3/uL (ref 150–400)
RBC: 3.97 MIL/uL (ref 3.87–5.11)
RDW: 17.5 % — ABNORMAL HIGH (ref 11.5–15.5)
WBC Count: 2.7 10*3/uL — ABNORMAL LOW (ref 4.0–10.5)
nRBC: 0 % (ref 0.0–0.2)

## 2021-11-07 MED ORDER — ACETAMINOPHEN 325 MG PO TABS
650.0000 mg | ORAL_TABLET | Freq: Once | ORAL | Status: AC
  Administered 2021-11-07: 650 mg via ORAL
  Filled 2021-11-07: qty 2

## 2021-11-07 MED ORDER — PALONOSETRON HCL INJECTION 0.25 MG/5ML
0.2500 mg | Freq: Once | INTRAVENOUS | Status: AC
  Administered 2021-11-07: 0.25 mg via INTRAVENOUS
  Filled 2021-11-07: qty 5

## 2021-11-07 MED ORDER — SODIUM CHLORIDE 0.9% FLUSH: 10.0000 mL | INTRAVENOUS | Status: DC | PRN

## 2021-11-07 MED ORDER — FAM-TRASTUZUMAB DERUXTECAN-NXKI CHEMO 100 MG IV SOLR
5.4000 mg/kg | Freq: Once | INTRAVENOUS | Status: AC
  Administered 2021-11-07: 294 mg via INTRAVENOUS
  Filled 2021-11-07: qty 14.7

## 2021-11-07 MED ORDER — DEXTROSE 5 % IV SOLN: Freq: Once | INTRAVENOUS | Status: AC

## 2021-11-07 MED ORDER — SODIUM CHLORIDE 0.9 % IV SOLN
10.0000 mg | Freq: Once | INTRAVENOUS | Status: AC
  Administered 2021-11-07: 10 mg via INTRAVENOUS
  Filled 2021-11-07: qty 10

## 2021-11-07 MED ORDER — DIPHENHYDRAMINE HCL 25 MG PO CAPS
25.0000 mg | ORAL_CAPSULE | Freq: Once | ORAL | Status: AC
  Administered 2021-11-07: 25 mg via ORAL
  Filled 2021-11-07: qty 1

## 2021-11-07 NOTE — Patient Instructions (Signed)
[facility]  Discharge Instructions: Thank you for choosing [facility] to provide your oncology and hematology care.   If you have a lab appointment with the [facility], please go directly to the [facility] and check in at the registration area.      We strive to give you quality time with your provider. You may need to reschedule your appointment if you arrive late (15 or more minutes).  Arriving late affects you and other patients whose appointments are after yours.  Also, if you miss three or more appointments without notifying the office, you may be dismissed from the clinic at the providers discretion.      For prescription refill requests, have your pharmacy contact our office and allow 72 hours for refills to be completed.    Today you received the following today. [place]   To help prevent nausea and vomiting after your treatment, we encourage you to take your nausea medication as directed.  BELOW ARE SYMPTOMS THAT SHOULD BE REPORTED IMMEDIATELY: *FEVER GREATER THAN 100.4 F (38 C) OR HIGHER *CHILLS OR SWEATING *NAUSEA AND VOMITING THAT IS NOT CONTROLLED WITH YOUR NAUSEA MEDICATION *UNUSUAL SHORTNESS OF BREATH *UNUSUAL BRUISING OR BLEEDING *URINARY PROBLEMS (pain or burning when urinating, or frequent urination) *BOWEL PROBLEMS (unusual diarrhea, constipation, pain near the anus) TENDERNESS IN MOUTH AND THROAT WITH OR WITHOUT PRESENCE OF ULCERS (sore throat, sores in mouth, or a toothache) UNUSUAL RASH, SWELLING OR PAIN  UNUSUAL VAGINAL DISCHARGE OR ITCHING   Items with * indicate a potential emergency and should be followed up as soon as possible or go to the Emergency Department if any problems should occur.  Please show the CHEMOTHERAPY ALERT CARD or IMMUNOTHERAPY ALERT CARD at check-in to the Emergency Department and triage nurse.  Should you have questions after your visit or need to cancel or reschedule your  appointment, please contact [facility]  Dept: [phone]  and follow the prompts.  Office hours are 8:00 a.m. to 4:30 p.m. Monday - Friday. Please note that voicemails left after 4:00 p.m. may not be returned until the following business day.  We are closed weekends and major holidays. You have access to a nurse at all times for urgent questions. Please call the main number to the clinic Dept: [phone] and follow the prompts.   For any non-urgent questions, you may also contact your provider using MyChart. We now offer e-Visits for anyone 4 and older to request care online for non-urgent symptoms. For details visit [email or web address].   Also download the MyChart app! Go to the app store, search "MyChart", open the app, select [facility], and log in with your MyChart username and password.  Due to Covid, a mask is required upon entering the hospital/clinic. If you do not have a mask, one will be given to you upon arrival. For doctor visits, patients may have 1 support person aged 31 or older with them. For treatment visits, patients cannot have anyone with them due to current Covid guidelines and our immunocompromised population.

## 2021-11-07 NOTE — Progress Notes (Signed)
Ok to treat with ANC of 1.3 and ok to treat without 2DECHO. NP is aware and will schedule.

## 2021-11-07 NOTE — Progress Notes (Signed)
[facility]  [phone] [phone]   Name: Misty Arnold Date: 11/07/2021 MRN: [id number]  DOB: Feb 20, 1971  Patient Care Team: Nicholas Lose, MD as PCP - General (Hematology and Oncology)    INTERVAL HISTORY: Misty Arnold is a 51 y.o. female with right metastatic breast cancer s/p mastectomy, large right chest wound secondary to necrotic mass, weankess due to T9 vertebral compression s/p tumor resection and decompressive laminectomy, and hypertension. Patient did not receive treatment for some time after seeking care in [place]. Currently receiving Enhertu treatment. Palliative following for symptom management and goals of care.   SOCIAL HISTORY:     reports that she has never smoked. She has never used smokeless tobacco. She reports that she does not currently use alcohol. She reports that she does not use drugs.  ADVANCE DIRECTIVES:  Advance directives completed during recent hospitalization 08/27/2021.  Documents are on file and have been reviewed personally and [name].  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; patient went to cancer treatment centers of [place] for second opinion and delayed therapy   01/26/2011 Surgery   right mastectomy followed by reconstruction: Invasive ductal carcinoma T1 N1 MIC M0 stage IB ER/PR positive HER-2 negative, BRCA negative,  Oncotype DX low risk   05/29/2012 Procedure   right chest wall nodule excision done on 06/24/2012 showed metastatic carcinoma margins were positive, but CT scan no metastatic disease, recommended chemotherapy but patient refused also refused reexcision   04/28/2013 Treatment Plan Change   patient went to [place] for alternative treatments and took herbal medications, tonics etc. But she could not afford these trips.   01/08/2014 Breast MRI   right breast multiple enhancing masses within the soft tissues largest 5.6 cm in wall skin surface and the capsule of the silicone prosthesis, contiguous nodules involving in inferomedial breast and tired and subcentimeter nodules across the midline   08/29/2015 Surgery   Right mastectomy: IDC with invol of skin and skin ulceration, breast capsule inv cancer, superior medial margin positive, 1/1 LN positive, grade 3, 14.3 cm, 3.5 cm, ALI, chest wall involv, ER 90-100%, PR 80-90%, HER-2 negative, Ki 67 40-50% T4CN1 (St 3B)   03/05/2016 -  Anti-estrogen oral therapy   Tamoxifen 20 mg daily stopped due to headache and uncontrolled hypertension. Decrease to 10 mg daily 04/03/2016, stopped June 2017 and took an estrogen metabolizer over-the-counter; started Aromasin April 2018 from a [place] physician, Zoladex started on 05/2017 and switched to Letrozole in 09/2017   01/04/2020 Surgery   Patient presented to the ED on 01/04/20 for worsening lower extremity numbness and underwent a decompressive laminectomy with tumor resection at T9 that was complicated with acute blood loss anemia and leukocytosis.   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  MEDICATIONS:  Current Outpatient Medications  Medication Sig Dispense Refill   acetaminophen (TYLENOL) 500 MG tablet Take 2 tablets (1,000 mg total) by mouth every 6 (six) hours as needed for fever.     Ascorbic Acid (VITAMIN C PO) Take 1,000 mg by mouth daily.     atenolol (TENORMIN) 50 MG tablet Take 1 tablet (50 mg total) by mouth 2 (two) times daily. 60 tablet 3   celecoxib (CELEBREX) 200 MG capsule Take 1 capsule (200 mg total) by mouth at bedtime. 30 capsule 3   Cholecalciferol (VITAMIN D3) 125 MCG (5000 UT) CAPS Take 1 capsule (5,000 Units total) by mouth daily. 30 capsule 3   diazepam (VALIUM) 5 MG tablet Take 1 tablet (5 mg total) by mouth every 12 (twelve) hours as needed for anxiety or muscle spasms (sleep). 60 tablet 0   enoxaparin (LOVENOX) 40 MG/0.4ML injection Inject 0.4 mLs (40 mg total) into the skin daily. 24 mL 0   levofloxacin (LEVAQUIN) 750 MG tablet Take 1 tablet (750 mg total) by mouth daily. 30 tablet 0   lidocaine-prilocaine (EMLA) cream APPLY TOPICALLY AS NEEDED. (Patient taking differently: Apply 1 application topically See admin instructions. Use with zoladex injection) 30 g 1   lip balm (CARMEX) ointment Apply 1 application topically as needed for lip care (cold sores). 7 g 0   metroNIDAZOLE (FLAGYL) 500 MG tablet Take 1 tablet (500 mg total) by  mouth 2 (two) times daily. (no alcohol) 60 tablet 0   metroNIDAZOLE (METROGEL) 0.75 % gel Apply topically 2 (two) times daily. 45 g 0   morphine (MSIR) 30 MG tablet Take 1 tablet (30 mg total) by mouth every 4 (four) hours as needed for severe pain. 120 tablet 0   Multiple Vitamins-Minerals (MULTIVITAMIN WITH MINERALS) tablet FLORADIX Supplement-Iron +minerals     Nutritional Supplements (,FEEDING SUPPLEMENT, PROSOURCE PLUS) liquid Take 30 mLs by mouth 4 (four) times daily - after meals and at bedtime.     oxyCODONE ER (XTAMPZA ER) 9 MG C12A Take 1 capsule by mouth 2 times daily. 60 capsule 0   polyethylene glycol (MIRALAX / GLYCOLAX) 17 g packet Take 17 g by mouth daily. 30 each 0   potassium chloride SA (KLOR-CON M) 20 MEQ tablet Take 1 tablet (20 mEq total) by mouth daily. 30 tablet 0   pregabalin (LYRICA) 50 MG capsule Take 1 capsule (50 mg total) by mouth at bedtime. 30 capsule 0   Silver Hydrogel GEL Apply to wound bed as directed with each dressing change. 44.4 mL 0   No current facility-administered medications for this visit.   Facility-Administered Medications Ordered in Other Visits  Medication Dose  Route Frequency Provider Last Rate Last Admin   dexamethasone (DECADRON) 10 mg in sodium chloride 0.9 % 50 mL IVPB  10 mg Intravenous Once Nicholas Lose, MD 204 mL/hr at 11/07/21 0954 10 mg at 11/07/21 0954   fam-trastuzumab deruxtecan-nxki (ENHERTU) 294 mg in dextrose 5 % 100 mL chemo infusion  5.4 mg/kg (Treatment Plan Recorded) Intravenous Once Nicholas Lose, MD       sodium chloride flush (NS) 0.9 % injection 10 mL  10 mL Intracatheter PRN Nicholas Lose, MD        VITAL SIGNS: There were no vitals taken for this visit. There were no vitals filed for this visit.  Estimated body mass index is 20.45 kg/m as calculated from the following:   Height as of an earlier encounter on 11/07/21: $RemoveBe'5\' 5"'PbjFkGLVi$  (1.651 m).   Weight as of an earlier encounter on 11/07/21: 122 lb 14.4 oz (55.7  kg).  LABS: CBC:    Component Value Date/Time   WBC 2.7 (L) 11/07/2021 0827   WBC 5.9 10/16/2021 1101   HGB 11.7 (L) 11/07/2021 0827   HGB 13.1 11/01/2017 1417   HCT 36.2 11/07/2021 0827   HCT 40.1 11/01/2017 1417   PLT 388 11/07/2021 0827   PLT 227 11/01/2017 1417   MCV 91.2 11/07/2021 0827   MCV 89.3 11/01/2017 1417   NEUTROABS 1.3 (L) 11/07/2021 0827   NEUTROABS 4.1 11/01/2017 1417   LYMPHSABS 0.8 11/07/2021 0827   LYMPHSABS 2.5 11/01/2017 1417   MONOABS 0.4 11/07/2021 0827   MONOABS 0.7 11/01/2017 1417   EOSABS 0.1 11/07/2021 0827   EOSABS 0.1 11/01/2017 1417   BASOSABS 0.0 11/07/2021 0827   BASOSABS 0.0 11/01/2017 1417   Comprehensive Metabolic Panel:    Component Value Date/Time   NA 140 11/07/2021 0827   NA 141 11/01/2017 1417   K 3.7 11/07/2021 0827   K 3.5 11/01/2017 1417   CL 110 11/07/2021 0827   CO2 23 11/07/2021 0827   CO2 26 11/01/2017 1417   BUN 14 11/07/2021 0827   BUN 15.3 11/01/2017 1417   CREATININE 0.80 11/07/2021 0827   CREATININE 0.8 11/01/2017 1417   GLUCOSE 122 (H) 11/07/2021 0827   GLUCOSE 81 11/01/2017 1417   CALCIUM 9.0 11/07/2021 0827   CALCIUM 8.9 11/01/2017 1417   AST 28 11/07/2021 0827   AST 12 11/01/2017 1417   ALT 13 11/07/2021 0827   ALT 14 11/01/2017 1417   ALKPHOS 111 11/07/2021 0827   ALKPHOS 121 11/01/2017 1417   BILITOT 0.3 11/07/2021 0827   BILITOT 0.23 11/01/2017 1417   PROT 7.0 11/07/2021 0827   PROT 6.5 11/01/2017 1417   ALBUMIN 3.7 11/07/2021 0827   ALBUMIN 3.6 11/01/2017 1417     PERFORMANCE STATUS (ECOG) : 1 - Symptomatic but completely ambulatory   Physical Exam General: NAD Cardiovascular: regular rate and rhythm Pulmonary: clear ant fields Abdomen: soft, nontender, + bowel sounds Extremities: no edema, no joint deformities Skin: no rashes, right chest mass, dressing clean, dry, intact with breast binder Neurological: AAO x3  IMPRESSION:  Ms. Girvan is here today for follow-up from recent  hospitalization.  No acute distress noted.  As outpatient in infusion.  She is sitting up in the recliner and reading a book.  Denies pain or shortness of breath.  Reports she has been doing well since discharge.  We discussed her appetite and pain regimen.  Is tolerating Xtampza.  Reports she is not requiring any breakthrough medications.  Denies constipation.  She feels her  appetite is much improved.  Continues to try to increase her protein intake and monitor foods that she eats focusing on more healthy driven diet.  Patient was given a case of boost on today.  She verbalized appreciation. She is staying with her brother who is assisting her with any needs.   I discussed the importance of continued conversation with family and their medical providers regarding overall plan of care and treatment options, ensuring decisions are within the context of the patients values and GOCs.  PLAN: Continue Xtampza for pain MS IR for breakthrough pain (reports not having to use much) Boost and education provided on continued increase in protein and nutrition Ongoing goals of care discussions I will plan to see her back in 3-4 weeks. Sooner if needed.    Patient expressed understanding and was in agreement with this plan. She also understands that She can call the clinic at any time with any questions, concerns, or complaints.     Time Total: 30 min.   Visit consisted of counseling and education dealing with the complex and emotionally intense issues of symptom management and palliative care in the setting of serious and potentially life-threatening illness.Greater than 50%  of this time was spent counseling and coordinating care related to the above assessment and plan.  Signed by: [name], AGPCNP-BC Palliative Medicine Team

## 2021-11-07 NOTE — Assessment & Plan Note (Addendum)
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  Treatment summary: 1.Right mastectomy 08/29/2015 at [place] (Dr.Singh): IDC with invol of skin and skin ulceration, breast capsule inv cancer, superior medial margin positive, 1/1 LN positive, grade 3, 14.3 cm, 3.5 cm, ALI, chest wall involv, ER 90-100%, PR 80-90%, HER-2 negative, Ki 67 40-50% T4CN1 (St 3B) 2.Patient started tamoxifen but developed severe headache with severe hypertension and tamoxifen was discontinued 03/17/2016. 3.02/2017 started on Exemestane, Zoladex started 05/29/2017 4.Hospitalization for paraplegia: T9 vertebral body compression status post decompressive laminectomy and tumor resection 01/04/2020 5.Palliative radiation to the thoracic spine 01/28/2020 6.Ibrance with letrozole started April 2021discontinued 07/24/2021 7.Enhertu started 10/17/2021 every 3 weeks  ----------------------------------------------------------------------------------------------------------------- 05/11/2021: CT angiogram: Metastatic breast cancer soft tissue breast masses greater on the right, axillary supraclavicular lymphadenopathy, multiple bone metastases   Hospitalization, discharged 10/16/2021:  Worsening right chest wall pain and infection  Treatment plan:Enhertu every 3 weeks   Toxicities: Fatigue on day 2 Mild leukopenia  #1 metastatic breast cancer.  She continues on treatment with Enhertu.  Today she will receive her second dose.  She appears to be tolerating this well.  She needs to undergo an echocardiogram.  Since she has no signs or symptoms of heart failure today or heart concerns she will proceed with Enhertu and we will get this scheduled prior  to her next infusion.  2.  Metastatic cancer related pain: This is controlled with Xtampza ER and morphine sulfate immediate release.  She is meeting the goal of pain control with it increasing functionality, and is not experiencing side effects such as constipation and somnolence.  3.  Right chest wall breast wound: This appears to be drying up.  She continues doing dressing changes and using the antibiotics as ordered.  We will see Misty Arnold back in clinic in 3 weeks for labs, follow-up, and her next treatment.

## 2021-11-10 ENCOUNTER — Telehealth: Payer: Self-pay

## 2021-11-10 ENCOUNTER — Other Ambulatory Visit (HOSPITAL_COMMUNITY): Payer: Self-pay

## 2021-11-10 ENCOUNTER — Encounter: Payer: Self-pay | Admitting: Hematology and Oncology

## 2021-11-10 NOTE — Telephone Encounter (Signed)
Oral Oncology Patient Advocate Encounter   Was successful in securing patient a $16000 grant from [employer] (PAF) to provide copayment coverage for Ibrance.  This will keep the out of pocket expense at $0.    The billing information is as follows and has been shared with [place]: [id number] PCN:  [id number] Member ID: [id number] Group ID: [id number] Dates of Eligibility: 11/08/21 through 11/08/22  [place] [facility] [employer] Phone [phone] Fax [phone] 11/10/2021 9:30 AM

## 2021-11-15 ENCOUNTER — Other Ambulatory Visit: Payer: Self-pay | Admitting: Hematology and Oncology

## 2021-11-15 ENCOUNTER — Other Ambulatory Visit (HOSPITAL_COMMUNITY): Payer: Self-pay

## 2021-11-15 ENCOUNTER — Telehealth: Payer: Self-pay

## 2021-11-15 MED ORDER — PREGABALIN 50 MG PO CAPS: 50.0000 mg | ORAL_CAPSULE | Freq: Every day | ORAL | 6 refills | Status: DC

## 2021-11-15 NOTE — Telephone Encounter (Signed)
Ms. Misty Arnold called our office to ask about her medications and refills, since she will be here tomorrow for an Echo. I told her that she would be able to call the pharmacy and refill her Atenolol and Celebrex. She would need to contact Dr. Geralyn Flash office about her Potassium, Lyrica, and antibiotics. Understanding verbalized. All questions answered. She also reported sweats and chills at times. No N/V. Taking pain medications intermittently but not needing them much. [name], NP notified. I advised her to call back with any questions/concerns.

## 2021-11-16 ENCOUNTER — Other Ambulatory Visit (HOSPITAL_COMMUNITY): Payer: Self-pay

## 2021-11-16 ENCOUNTER — Other Ambulatory Visit: Payer: Self-pay

## 2021-11-16 ENCOUNTER — Ambulatory Visit (HOSPITAL_COMMUNITY)
Admission: RE | Admit: 2021-11-16 | Discharge: 2021-11-16 | Disposition: A | Discharge status: Home / Self Care | Payer: Medicare PPO | Source: Ambulatory Visit | Attending: Adult Health | Admitting: Adult Health

## 2021-11-16 DIAGNOSIS — Z170 Estrogen receptor positive status [ER+]: Secondary | ICD-10-CM | POA: Diagnosis not present

## 2021-11-16 DIAGNOSIS — I10 Essential (primary) hypertension: Secondary | ICD-10-CM | POA: Insufficient documentation

## 2021-11-16 DIAGNOSIS — Z0189 Encounter for other specified special examinations: Secondary | ICD-10-CM | POA: Diagnosis not present

## 2021-11-16 DIAGNOSIS — C50411 Malignant neoplasm of upper-outer quadrant of right female breast: Secondary | ICD-10-CM | POA: Insufficient documentation

## 2021-11-16 DIAGNOSIS — R000 Tachycardia, unspecified: Secondary | ICD-10-CM | POA: Diagnosis not present

## 2021-11-16 DIAGNOSIS — Z5181 Encounter for therapeutic drug level monitoring: Secondary | ICD-10-CM | POA: Insufficient documentation

## 2021-11-16 LAB — ECHOCARDIOGRAM COMPLETE
Area-P 1/2: 4.21 cm2
Calc EF: 55 %
S' Lateral: 2.8 cm
Single Plane A2C EF: 51.7 %
Single Plane A4C EF: 56.8 %

## 2021-11-16 NOTE — Progress Notes (Signed)
°  Echocardiogram 2D Echocardiogram has been performed.  Bobbye Charleston 11/16/2021, 11:05 AM

## 2021-11-21 ENCOUNTER — Ambulatory Visit: Payer: Medicare PPO

## 2021-11-22 DIAGNOSIS — C7949 Secondary malignant neoplasm of other parts of nervous system: Secondary | ICD-10-CM | POA: Diagnosis not present

## 2021-11-22 DIAGNOSIS — S21109A Unspecified open wound of unspecified front wall of thorax without penetration into thoracic cavity, initial encounter: Secondary | ICD-10-CM | POA: Diagnosis not present

## 2021-11-27 ENCOUNTER — Telehealth: Payer: Self-pay | Admitting: Hematology and Oncology

## 2021-11-27 MED FILL — Dexamethasone Sodium Phosphate Inj 100 MG/10ML: INTRAMUSCULAR | Qty: 1 | Status: AC

## 2021-11-27 NOTE — Telephone Encounter (Signed)
Sch per 1/23 inbasket, pt aware of r/s

## 2021-11-27 NOTE — Progress Notes (Signed)
Patient Care Team: Nicholas Lose, MD as PCP - General (Hematology and Oncology)  DIAGNOSIS:    ICD-10-CM   1. Malignant neoplasm of upper-outer quadrant of right breast in female, estrogen receptor positive ([place])  C50.411    Z17.0       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; patient went to cancer treatment centers of [place] for second opinion and delayed therapy   01/26/2011 Surgery   right mastectomy followed by reconstruction: 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 But she could not afford these trips.   01/08/2014 Breast MRI   right breast multiple enhancing masses within the soft tissues largest 5.6 cm in wall skin surface and the capsule of the silicone prosthesis, contiguous nodules involving in inferomedial breast and tired and subcentimeter nodules across the midline   08/29/2015 Surgery   Right mastectomy: IDC with invol of skin and skin ulceration, breast capsule inv cancer, superior medial margin positive, 1/1 LN positive, grade 3, 14.3 cm, 3.5 cm, ALI, chest wall involv, ER 90-100%, PR 80-90%, HER-2 negative, Ki 67 40-50% T4CN1 (St 3B)   03/05/2016 -  Anti-estrogen oral therapy   Tamoxifen 20 mg daily stopped due to headache  and uncontrolled hypertension. Decrease to 10 mg daily 04/03/2016, stopped June 2017 and took an estrogen metabolizer over-the-counter; started Aromasin April 2018 from a [place] physician, Zoladex started on 05/2017 and switched to Letrozole in 09/2017   01/04/2020 Surgery   Patient presented to the ED on 01/04/20 for worsening lower extremity numbness and underwent a decompressive laminectomy with tumor resection at T9 that was complicated with acute blood loss anemia and leukocytosis.   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 with above-mentioned history of metastatic breast cancer currently on chemotherapy with Enhertu. She presents to the clinic today for follow-up and treatment.  Since she stopped seeing the wound  care specialist she is doing her own dressing changes.  She tells me that she has had a very difficult time getting the supplies.  She was doing much better when she had home health assist with the dressing changes.  Over the past several weeks she has had no problems with  bleeding.  Her appetite is fairly poor.  ALLERGIES:  is allergic to amoxicillin, penicillins, pork-derived products, penicillin g, and tamoxifen.  MEDICATIONS:  Current Outpatient Medications  Medication Sig Dispense Refill   acetaminophen (TYLENOL) 500 MG tablet Take 2 tablets (1,000 mg total) by mouth every 6 (six) hours as needed for fever.     Ascorbic Acid (VITAMIN C PO) Take 1,000 mg by mouth daily.     atenolol (TENORMIN) 50 MG tablet Take 1 tablet (50 mg total) by mouth 2 (two) times daily. 60 tablet 3   celecoxib (CELEBREX) 200 MG capsule Take 1 capsule (200 mg total) by mouth at bedtime. 30 capsule 3   Cholecalciferol (VITAMIN D3) 125 MCG (5000 UT) CAPS Take 1 capsule (5,000 Units total) by mouth daily. 30 capsule 3   diazepam (VALIUM) 5 MG tablet Take 1 tablet (5 mg total) by mouth every 12 (twelve) hours as needed for anxiety or muscle spasms (sleep). 60 tablet 0   enoxaparin (LOVENOX) 40 MG/0.4ML injection Inject 0.4 mLs (40 mg total) into the skin daily. 24 mL 0   lidocaine-prilocaine (EMLA) cream APPLY TOPICALLY AS NEEDED. (Patient taking differently: Apply 1 application topically See admin instructions. Use with zoladex injection) 30 g 1   lip balm (CARMEX) ointment Apply 1 application topically as needed for lip care (cold sores). 7 g 0   metroNIDAZOLE (METROGEL) 0.75 % gel Apply topically 2 (two) times daily. 45 g 0   morphine (MSIR) 30 MG tablet Take 1 tablet (30 mg total) by mouth every 4 (four) hours as needed for severe pain. 120 tablet 0   Multiple Vitamins-Minerals (MULTIVITAMIN WITH MINERALS) tablet FLORADIX Supplement-Iron +minerals     Nutritional Supplements (,FEEDING SUPPLEMENT, PROSOURCE PLUS) liquid Take 30 mLs by mouth 4 (four) times daily - after meals and at bedtime.     oxyCODONE ER (XTAMPZA ER) 9 MG C12A Take 1 capsule by mouth 2 times daily. 60 capsule 0   polyethylene glycol (MIRALAX / GLYCOLAX) 17 g packet Take 17 g by mouth daily. 30 each 0   potassium  chloride SA (KLOR-CON M) 20 MEQ tablet Take 1 tablet (20 mEq total) by mouth daily. 30 tablet 0   pregabalin (LYRICA) 50 MG capsule Take 1 capsule (50 mg total) by mouth at bedtime. 30 capsule 6   Silver Hydrogel GEL Apply to wound bed as directed with each dressing change. 44.4 mL 0   No current facility-administered medications for this visit.    PHYSICAL EXAMINATION: ECOG PERFORMANCE STATUS: 1 - Symptomatic but completely ambulatory  Vitals:   11/28/21 1405  BP: (!) 174/98  Pulse: 76  Resp: 18  Temp: (!) 97.3 F (36.3 C)  SpO2: 100%   Filed Weights   11/28/21 1405  Weight: 118 lb 4.8 oz (53.7 kg)      LABORATORY DATA:  I have reviewed the data as listed CMP Latest Ref Rng & Units 11/07/2021 10/16/2021 10/13/2021  Glucose 70 - 99 mg/dL 122(H) 124(H) 90  BUN 6 - 20 mg/dL _0 Creatinine 0.44 - 1.00 mg/dL 0.80 0.64 0.62  Sodium 135 - 145 mmol/L 140 140 138  Potassium 3.5 - 5.1 mmol/L 3.7 3.6  3.9  Chloride 98 - 111 mmol/L 110 108 104  CO2 22 - 32 mmol/L _0 Calcium 8.9 - 10.3 mg/dL 9.0 8.6(L) 8.8(L)  Total Protein 6.5 - 8.1 g/dL 7.0 - -  Total Bilirubin 0.3 - 1.2 mg/dL 0.3 - -  Alkaline Phos 38 - 126 U/L 111 - -  AST 15 - 41 U/L 28 - -  ALT 0 - 44 U/L 13 - -    Lab Results  Component Value Date   WBC 3.3 (L) 11/28/2021   HGB 13.1 11/28/2021   HCT 40.9 11/28/2021   MCV 92.7 11/28/2021   PLT 373 11/28/2021   NEUTROABS 1.6 (L) 11/28/2021    ASSESSMENT & PLAN:  Breast cancer of upper-outer quadrant of right female breast ([place]) Recurrent right breast cancer initially DCIS treated with mastectomy followed by reconstruction and later developed chest wall recurrence in 2013 ER/PR positive HER-2 negative, patient underwent alternative therapies in [place] but could not afford the trips and hence she has not been on any breast cancer therapy for a long time.   Treatment summary: 1. Right mastectomy 08/29/2015 at [facility] (Dr.Singh): IDC with invol of skin and skin  ulceration, breast capsule inv cancer, superior medial margin positive, 1/1 LN positive, grade 3, 14.3 cm, 3.5 cm, ALI, chest wall involv, ER 90-100%, PR 80-90%, HER-2 negative, Ki 67 40-50% T4CN1 (St 3B) 2. Patient started tamoxifen but developed severe headache with severe hypertension and tamoxifen was discontinued 03/17/2016.  3. 02/2017 started on Exemestane, Zoladex started 05/29/2017 4.  Hospitalization for paraplegia: T9 vertebral body compression status post decompressive laminectomy and tumor resection 01/04/2020 5.  Palliative radiation to the thoracic spine 01/28/2020 6.  Ibrance with letrozole started April 2021 discontinued 07/24/2021 7.  Enhertu started 07/31/2021 every 3 weeks  ----------------------------------------------------------------------------------------------------------------- 05/11/2021: CT angiogram: Metastatic breast cancer soft tissue breast masses greater on the right, axillary supraclavicular lymphadenopathy, multiple bone metastases    Hospitalization 08/25/2021-09/05/2021: Worsening right chest wall pain and infection (patient has appointments with palliative care as well as infectious disease)   Current treatment: Enhertu started 10/17/2021, today cycle 3 Toxicities: Tolerating it extremely well without any major problems. Denies any shortness of breath exertion.  Home health request: Patient is not ambulatory and requires assistance for transportation.  She has a wound that needs twice a day dressings.  Based on multiple reasons I request home health to take care of her wound care issues.  Our plan is to perform scans in March. She is now living with her brother in [place] who is bringing her for her treatments. Return to clinic every 3 weeks for chemotherapy    No orders of the defined types were placed in this encounter.  The patient has a good understanding of the overall plan. she agrees with it. she will call with any problems that may develop before the  next visit here.  Total time spent: 30 mins including face to face time and time spent for planning, charting and coordination of care  Rulon Eisenmenger, MD, MPH 11/28/2021  I, Misty Arnold, am acting as scribe for Dr. Nicholas Lose.  I have reviewed the above documentation for accuracy and completeness, and I agree with the above.

## 2021-11-28 ENCOUNTER — Inpatient Hospital Stay: Payer: Medicare PPO

## 2021-11-28 ENCOUNTER — Other Ambulatory Visit: Payer: Self-pay | Admitting: *Deleted

## 2021-11-28 ENCOUNTER — Inpatient Hospital Stay (HOSPITAL_BASED_OUTPATIENT_CLINIC_OR_DEPARTMENT_OTHER): Payer: Medicare PPO | Admitting: Hematology and Oncology

## 2021-11-28 ENCOUNTER — Inpatient Hospital Stay: Payer: Medicare PPO | Admitting: Hematology and Oncology

## 2021-11-28 ENCOUNTER — Encounter: Payer: Self-pay | Admitting: Nurse Practitioner

## 2021-11-28 ENCOUNTER — Other Ambulatory Visit (HOSPITAL_COMMUNITY): Payer: Self-pay

## 2021-11-28 ENCOUNTER — Encounter: Payer: Self-pay | Admitting: *Deleted

## 2021-11-28 ENCOUNTER — Inpatient Hospital Stay (HOSPITAL_BASED_OUTPATIENT_CLINIC_OR_DEPARTMENT_OTHER): Payer: Medicare PPO | Admitting: Nurse Practitioner

## 2021-11-28 ENCOUNTER — Other Ambulatory Visit: Payer: Self-pay

## 2021-11-28 ENCOUNTER — Inpatient Hospital Stay: Payer: Medicare PPO | Admitting: Nurse Practitioner

## 2021-11-28 VITALS — BP 174/98 | HR 76 | Temp 97.3°F | Resp 18 | Ht 65.0 in | Wt 118.3 lb

## 2021-11-28 DIAGNOSIS — Z5112 Encounter for antineoplastic immunotherapy: Secondary | ICD-10-CM | POA: Diagnosis not present

## 2021-11-28 DIAGNOSIS — C50919 Malignant neoplasm of unspecified site of unspecified female breast: Secondary | ICD-10-CM

## 2021-11-28 DIAGNOSIS — C50411 Malignant neoplasm of upper-outer quadrant of right female breast: Secondary | ICD-10-CM

## 2021-11-28 DIAGNOSIS — R634 Abnormal weight loss: Secondary | ICD-10-CM

## 2021-11-28 DIAGNOSIS — C7949 Secondary malignant neoplasm of other parts of nervous system: Secondary | ICD-10-CM

## 2021-11-28 DIAGNOSIS — R530 Neoplastic (malignant) related fatigue: Secondary | ICD-10-CM | POA: Diagnosis not present

## 2021-11-28 DIAGNOSIS — Z170 Estrogen receptor positive status [ER+]: Secondary | ICD-10-CM | POA: Diagnosis not present

## 2021-11-28 DIAGNOSIS — Z923 Personal history of irradiation: Secondary | ICD-10-CM | POA: Diagnosis not present

## 2021-11-28 DIAGNOSIS — Z9221 Personal history of antineoplastic chemotherapy: Secondary | ICD-10-CM | POA: Diagnosis not present

## 2021-11-28 DIAGNOSIS — Z79899 Other long term (current) drug therapy: Secondary | ICD-10-CM | POA: Diagnosis not present

## 2021-11-28 DIAGNOSIS — Z515 Encounter for palliative care: Secondary | ICD-10-CM

## 2021-11-28 DIAGNOSIS — Z9011 Acquired absence of right breast and nipple: Secondary | ICD-10-CM | POA: Diagnosis not present

## 2021-11-28 LAB — CMP (CANCER CENTER ONLY)
ALT: 39 U/L (ref 0–44)
AST: 39 U/L (ref 15–41)
Albumin: 4 g/dL (ref 3.5–5.0)
Alkaline Phosphatase: 145 U/L — ABNORMAL HIGH (ref 38–126)
Anion gap: 5 (ref 5–15)
BUN: 10 mg/dL (ref 6–20)
CO2: 29 mmol/L (ref 22–32)
Calcium: 9.7 mg/dL (ref 8.9–10.3)
Chloride: 106 mmol/L (ref 98–111)
Creatinine: 0.69 mg/dL (ref 0.44–1.00)
GFR, Estimated: >60 mL/min (ref >60)
Glucose, Bld: 76 mg/dL (ref 70–99)
Potassium: 3.9 mmol/L (ref 3.5–5.1)
Sodium: 140 mmol/L (ref 135–145)
Total Bilirubin: 0.3 mg/dL (ref 0.3–1.2)
Total Protein: 7.2 g/dL (ref 6.5–8.1)

## 2021-11-28 LAB — CBC WITH DIFFERENTIAL (CANCER CENTER ONLY)
Abs Immature Granulocytes: 0.01 10*3/uL (ref 0.00–0.07)
Basophils Absolute: 0 10*3/uL (ref 0.0–0.1)
Basophils Relative: 1 %
Eosinophils Absolute: 0 10*3/uL (ref 0.0–0.5)
Eosinophils Relative: 1 %
HCT: 40.9 % (ref 36.0–46.0)
Hemoglobin: 13.1 g/dL (ref 12.0–15.0)
Immature Granulocytes: 0 %
Lymphocytes Relative: 33 %
Lymphs Abs: 1.1 10*3/uL (ref 0.7–4.0)
MCH: 29.7 pg (ref 26.0–34.0)
MCHC: 32 g/dL (ref 30.0–36.0)
MCV: 92.7 fL (ref 80.0–100.0)
Monocytes Absolute: 0.5 10*3/uL (ref 0.1–1.0)
Monocytes Relative: 15 %
Neutro Abs: 1.6 10*3/uL — ABNORMAL LOW (ref 1.7–7.7)
Neutrophils Relative %: 50 %
Platelet Count: 373 10*3/uL (ref 150–400)
RBC: 4.41 MIL/uL (ref 3.87–5.11)
RDW: 18 % — ABNORMAL HIGH (ref 11.5–15.5)
WBC Count: 3.3 10*3/uL — ABNORMAL LOW (ref 4.0–10.5)
nRBC: 0 % (ref 0.0–0.2)

## 2021-11-28 MED ORDER — DEXTROSE 5 % IV SOLN: Freq: Once | INTRAVENOUS | Status: AC

## 2021-11-28 MED ORDER — MEGESTROL ACETATE 625 MG/5ML PO SUSP
625.0000 mg | Freq: Every day | ORAL | 3 refills | Status: DC
  Filled 2021-11-28: qty 150, 30d supply, fill #0

## 2021-11-28 MED ORDER — ACETAMINOPHEN 325 MG PO TABS
650.0000 mg | ORAL_TABLET | Freq: Once | ORAL | Status: AC
  Administered 2021-11-28: 16:00:00 650 mg via ORAL
  Filled 2021-11-28: qty 2

## 2021-11-28 MED ORDER — DIPHENHYDRAMINE HCL 25 MG PO CAPS
25.0000 mg | ORAL_CAPSULE | Freq: Once | ORAL | Status: AC
  Administered 2021-11-28: 16:00:00 25 mg via ORAL
  Filled 2021-11-28: qty 1

## 2021-11-28 MED ORDER — PALONOSETRON HCL INJECTION 0.25 MG/5ML
0.2500 mg | Freq: Once | INTRAVENOUS | Status: AC
  Administered 2021-11-28: 16:00:00 0.25 mg via INTRAVENOUS
  Filled 2021-11-28: qty 5

## 2021-11-28 MED ORDER — SODIUM CHLORIDE 0.9 % IV SOLN
10.0000 mg | Freq: Once | INTRAVENOUS | Status: AC
  Administered 2021-11-28: 16:00:00 10 mg via INTRAVENOUS
  Filled 2021-11-28: qty 10

## 2021-11-28 MED ORDER — FAM-TRASTUZUMAB DERUXTECAN-NXKI CHEMO 100 MG IV SOLR
5.4000 mg/kg | Freq: Once | INTRAVENOUS | Status: AC
  Administered 2021-11-28: 16:00:00 294 mg via INTRAVENOUS
  Filled 2021-11-28: qty 14.7

## 2021-11-28 NOTE — Assessment & Plan Note (Signed)
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  Treatment summary: 1.Right mastectomy 08/29/2015 at [facility] (Dr.Singh): IDC with invol of skin and skin ulceration, breast capsule inv cancer, superior medial margin positive, 1/1 LN positive, grade 3, 14.3 cm, 3.5 cm, ALI, chest wall involv, ER 90-100%, PR 80-90%, HER-2 negative, Ki 67 40-50% T4CN1 (St 3B) 2.Patient started tamoxifen but developed severe headache with severe hypertension and tamoxifen was discontinued 03/17/2016. 3.02/2017 started on Exemestane, Zoladex started 05/29/2017 4.Hospitalization for paraplegia: T9 vertebral body compression status post decompressive laminectomy and tumor resection 01/04/2020 5.Palliative radiation to the thoracic spine 01/28/2020 6.Ibrance with letrozole started April 2021discontinued 07/24/2021 7.Enhertu started 07/31/2021 every 3 weeks  ----------------------------------------------------------------------------------------------------------------- 05/11/2021: CT angiogram: Metastatic breast cancer soft tissue breast masses greater on the right, axillary supraclavicular lymphadenopathy, multiple bone metastases  Hospitalization 08/25/2021-09/05/2021: Worsening right chest wall pain and infection(patient has appointments with palliative care as well as infectious disease)  Current treatment: Enhertu started 10/17/2021, today cycle 3 Toxicities:  Return to clinic every 3 weeks for chemotherapy

## 2021-11-28 NOTE — Progress Notes (Signed)
Received call from advance [facility] stating pt is not located within their service area.  RN placed call to [facility] in [place] and received referral fax contact.   RN successfully faxed referral to [phone].

## 2021-11-28 NOTE — Patient Instructions (Signed)
[facility]  Discharge Instructions: Thank you for choosing [facility] to provide your oncology and hematology care.   If you have a lab appointment with the [facility], please go directly to the [facility] and check in at the registration area.   Wear comfortable clothing and clothing appropriate for easy access to any Portacath or PICC line.   We strive to give you quality time with your provider. You may need to reschedule your appointment if you arrive late (15 or more minutes).  Arriving late affects you and other patients whose appointments are after yours.  Also, if you miss three or more appointments without notifying the office, you may be dismissed from the clinic at the providers discretion.      For prescription refill requests, have your pharmacy contact our office and allow 72 hours for refills to be completed.    Today you received the following chemotherapy and/or immunotherapy agents: enhertu      To help prevent nausea and vomiting after your treatment, we encourage you to take your nausea medication as directed.  BELOW ARE SYMPTOMS THAT SHOULD BE REPORTED IMMEDIATELY: *FEVER GREATER THAN 100.4 F (38 C) OR HIGHER *CHILLS OR SWEATING *NAUSEA AND VOMITING THAT IS NOT CONTROLLED WITH YOUR NAUSEA MEDICATION *UNUSUAL SHORTNESS OF BREATH *UNUSUAL BRUISING OR BLEEDING *URINARY PROBLEMS (pain or burning when urinating, or frequent urination) *BOWEL PROBLEMS (unusual diarrhea, constipation, pain near the anus) TENDERNESS IN MOUTH AND THROAT WITH OR WITHOUT PRESENCE OF ULCERS (sore throat, sores in mouth, or a toothache) UNUSUAL RASH, SWELLING OR PAIN  UNUSUAL VAGINAL DISCHARGE OR ITCHING   Items with * indicate a potential emergency and should be followed up as soon as possible or go to the Emergency Department if any problems should occur.  Please show the CHEMOTHERAPY ALERT CARD or IMMUNOTHERAPY ALERT CARD at check-in to  the Emergency Department and triage nurse.  Should you have questions after your visit or need to cancel or reschedule your appointment, please contact [facility]  Dept: [phone]  and follow the prompts.  Office hours are 8:00 a.m. to 4:30 p.m. Monday - Friday. Please note that voicemails left after 4:00 p.m. may not be returned until the following business day.  We are closed weekends and major holidays. You have access to a nurse at all times for urgent questions. Please call the main number to the clinic Dept: [phone] and follow the prompts.   For any non-urgent questions, you may also contact your provider using MyChart. We now offer e-Visits for anyone 47 and older to request care online for non-urgent symptoms. For details visit [email or web address].   Also download the MyChart app! Go to the app store, search "MyChart", open the app, select [facility], and log in with your MyChart username and password.  Due to Covid, a mask is required upon entering the hospital/clinic. If you do not have a mask, one will be given to you upon arrival. For doctor visits, patients may have 1 support person aged 51 or older with them. For treatment visits, patients cannot have anyone with them due to current Covid guidelines and our immunocompromised population.

## 2021-11-28 NOTE — Progress Notes (Signed)
Per MD request RN placed referral for [facility] RN for management of right sided breast wound.  RN placed call to advance [facility] and LVM with intake team to see if pt can establish care with their company.

## 2021-11-28 NOTE — Progress Notes (Signed)
[facility]  Telephone:(336) [phone] Fax:([phone]) [phone]   Name: Misty Arnold Date: 11/28/2021 MRN: [id number]  DOB: September 18, 1971  Patient Care Team: Nicholas Lose, MD as PCP - General (Hematology and Oncology)    INTERVAL HISTORY: Misty Arnold is a 51 y.o. female with right metastatic breast cancer s/p mastectomy, large right chest wound secondary to necrotic mass, weankess due to T9 vertebral compression s/p tumor resection and decompressive laminectomy, and hypertension. Patient did not receive treatment for some time after seeking care in [place]. Currently receiving Enhertu treatment. Palliative following for symptom management and goals of care.   SOCIAL HISTORY:     reports that she has never smoked. She has never used smokeless tobacco. She reports that she does not currently use alcohol. She reports that she does not use drugs.  ADVANCE DIRECTIVES:  Advance directives completed during recent hospitalization 08/27/2021.  Documents are on file and have been reviewed personally and [name].  CODE STATUS: Full code  PAST MEDICAL HISTORY: Past Medical History:  Diagnosis Date   Abnormal uterine bleeding    Anemia    Breast cancer ([place])    Depression    Fibroid    Hormone disorder    Hypertension    Infertility, female    ALLERGIES:  is allergic to amoxicillin, penicillins, pork-derived products, penicillin g, and tamoxifen.  MEDICATIONS:  Current Outpatient Medications  Medication Sig Dispense Refill   acetaminophen (TYLENOL) 500 MG tablet Take 2 tablets (1,000 mg total) by mouth every 6 (six) hours as needed for fever.     Ascorbic Acid (VITAMIN C PO) Take 1,000 mg by mouth daily.     atenolol (TENORMIN) 50 MG tablet Take 1 tablet (50 mg total) by mouth 2 (two) times daily. 60 tablet 3   celecoxib (CELEBREX) 200 MG capsule Take 1 capsule (200 mg total) by mouth at bedtime. 30 capsule 3   Cholecalciferol (VITAMIN D3) 125 MCG  (5000 UT) CAPS Take 1 capsule (5,000 Units total) by mouth daily. 30 capsule 3   diazepam (VALIUM) 5 MG tablet Take 1 tablet (5 mg total) by mouth every 12 (twelve) hours as needed for anxiety or muscle spasms (sleep). 60 tablet 0   enoxaparin (LOVENOX) 40 MG/0.4ML injection Inject 0.4 mLs (40 mg total) into the skin daily. 24 mL 0   lidocaine-prilocaine (EMLA) cream APPLY TOPICALLY AS NEEDED. (Patient taking differently: Apply 1 application topically See admin instructions. Use with zoladex injection) 30 g 1   lip balm (CARMEX) ointment Apply 1 application topically as needed for lip care (cold sores). 7 g 0   megestrol (MEGACE ES) 625 MG/5ML suspension Take 5 mLs (625 mg total) by mouth daily. 150 mL 3   metroNIDAZOLE (METROGEL) 0.75 % gel Apply topically 2 (two) times daily. 45 g 0   morphine (MSIR) 30 MG tablet Take 1 tablet (30 mg total) by mouth every 4 (four) hours as needed for severe pain. 120 tablet 0   Multiple Vitamins-Minerals (MULTIVITAMIN WITH MINERALS) tablet FLORADIX Supplement-Iron +minerals     Nutritional Supplements (,FEEDING SUPPLEMENT, PROSOURCE PLUS) liquid Take 30 mLs by mouth 4 (four) times daily - after meals and at bedtime.     ondansetron (ZOFRAN) 8 MG tablet Take by mouth.     oxyCODONE ER (XTAMPZA ER) 9 MG C12A Take 1 capsule by mouth 2 times daily. 60 capsule 0   polyethylene glycol (MIRALAX / GLYCOLAX) 17 g packet Take 17 g by mouth daily. 30 each 0  potassium chloride SA (KLOR-CON M) 20 MEQ tablet Take 1 tablet (20 mEq total) by mouth daily. 30 tablet 0   pregabalin (LYRICA) 50 MG capsule Take 1 capsule (50 mg total) by mouth at bedtime. 30 capsule 6   Silver Hydrogel GEL Apply to wound bed as directed with each dressing change. 44.4 mL 0   No current facility-administered medications for this visit.   Facility-Administered Medications Ordered in Other Visits  Medication Dose Route Frequency Provider Last Rate Last Admin   fam-trastuzumab deruxtecan-nxki  (ENHERTU) 294 mg in dextrose 5 % 100 mL chemo infusion  5.4 mg/kg (Treatment Plan Recorded) Intravenous Once Nicholas Lose, MD        VITAL SIGNS: There were no vitals taken for this visit. There were no vitals filed for this visit.  Estimated body mass index is 19.69 kg/m as calculated from the following:   Height as of an earlier encounter on 11/28/21: 5\' 5"  (1.651 m).   Weight as of an earlier encounter on 11/28/21: 118 lb 4.8 oz (53.7 kg).  LABS: CBC:    Component Value Date/Time   WBC 3.3 (L) 11/28/2021 1359   WBC 5.9 10/16/2021 1101   HGB 13.1 11/28/2021 1359   HGB 13.1 11/01/2017 1417   HCT 40.9 11/28/2021 1359   HCT 40.1 11/01/2017 1417   PLT 373 11/28/2021 1359   PLT 227 11/01/2017 1417   MCV 92.7 11/28/2021 1359   MCV 89.3 11/01/2017 1417   NEUTROABS 1.6 (L) 11/28/2021 1359   NEUTROABS 4.1 11/01/2017 1417   LYMPHSABS 1.1 11/28/2021 1359   LYMPHSABS 2.5 11/01/2017 1417   MONOABS 0.5 11/28/2021 1359   MONOABS 0.7 11/01/2017 1417   EOSABS 0.0 11/28/2021 1359   EOSABS 0.1 11/01/2017 1417   BASOSABS 0.0 11/28/2021 1359   BASOSABS 0.0 11/01/2017 1417   Comprehensive Metabolic Panel:    Component Value Date/Time   NA 140 11/28/2021 1359   NA 141 11/01/2017 1417   K 3.9 11/28/2021 1359   K 3.5 11/01/2017 1417   CL 106 11/28/2021 1359   CO2 29 11/28/2021 1359   CO2 26 11/01/2017 1417   BUN 10 11/28/2021 1359   BUN 15.3 11/01/2017 1417   CREATININE 0.69 11/28/2021 1359   CREATININE 0.8 11/01/2017 1417   GLUCOSE 76 11/28/2021 1359   GLUCOSE 81 11/01/2017 1417   CALCIUM 9.7 11/28/2021 1359   CALCIUM 8.9 11/01/2017 1417   AST 39 11/28/2021 1359   AST 12 11/01/2017 1417   ALT 39 11/28/2021 1359   ALT 14 11/01/2017 1417   ALKPHOS 145 (H) 11/28/2021 1359   ALKPHOS 121 11/01/2017 1417   BILITOT 0.3 11/28/2021 1359   BILITOT 0.23 11/01/2017 1417   PROT 7.2 11/28/2021 1359   PROT 6.5 11/01/2017 1417   ALBUMIN 4.0 11/28/2021 1359   ALBUMIN 3.6 11/01/2017 1417      PERFORMANCE STATUS (ECOG) : 1 - Symptomatic but completely ambulatory   Physical Exam General: NAD Neurological: AAO x3  IMPRESSION:  I saw Ms. Subramanian today for follow-up during her infusion. No acute distress noted. Is sitting in recliner eating a sandwhich. Shares no significant symptoms at this time. She is tolerating Enhertu without complications.  Reports she is feeling better each day.   She states her pain has pretty much resolved and she is not requiring any forms of pain medication outside of Tylenol.  Improvement in her fatigue.  She is staying with her brother and reports she is able to assist around the home and  is now more engaged in things that she wants appreciated.  Appetite continues to be a challenge.  States some days are better than other.  She is eating when she is hungry and trying to eat as much as she can including increased protein.  Is tolerating boost protein drinks at least twice a day.  She was seen by Dr. Lindi Adie today and started on Megace for appetite stimulation.  Overall Ms. Stotts is doing much better and seemingly showing signs of stability with appreciation and her increased quality of life.  PLAN: No refills needed on today.  Patient reports pain has pretty much resolved and she is not currently taking Xtampza ER or MS IR. Appetite continues to be a challenge but is somewhat improved.  Is drinking boost protein drinks at least 1-2 times daily.  Her weight has decreased since last visit.  She has been started on Megace daily by Dr. Lindi Adie. I will plan to see her back in 4 to 6 weeks.  Patient expressed understanding and was in agreement with this plan. She also understands that She can call the clinic at any time with any questions, concerns, or complaints.    Time Total: 20 min  Visit consisted of counseling and education dealing with the complex and emotionally intense issues of symptom management and palliative care in the setting of serious  and potentially life-threatening illness.Greater than 50%  of this time was spent counseling and coordinating care related to the above assessment and plan.  [name], AGPCNP-BC  Palliative Medicine Team [phone]

## 2021-11-29 ENCOUNTER — Other Ambulatory Visit (HOSPITAL_COMMUNITY): Payer: Self-pay

## 2021-11-30 ENCOUNTER — Other Ambulatory Visit (HOSPITAL_COMMUNITY): Payer: Self-pay

## 2021-12-05 ENCOUNTER — Encounter: Payer: Self-pay | Admitting: Licensed Clinical Social Worker

## 2021-12-05 NOTE — Progress Notes (Signed)
[facility] CSW Progress Note  Clinical Education officer, museum  received TC from pt  to ask about lodging. She only has transportation to [place] on 2/13, not 2/14, so she needs to stay the night before. CSW reminded pt of [place] lodging assistance as well as discounts through [employer].   Pt will need a ride from lodging to cancer center on 2/14. Message sent to scheduler for assistance setting up ride.  CSW also reminded pt of remaining [employer] fund cards and will leave next set at check-in on 2/14. Also sent application for [employer] today.    Christeen Douglas , LCSW

## 2021-12-11 DIAGNOSIS — H524 Presbyopia: Secondary | ICD-10-CM | POA: Diagnosis not present

## 2021-12-12 ENCOUNTER — Encounter: Payer: Self-pay | Admitting: *Deleted

## 2021-12-12 ENCOUNTER — Other Ambulatory Visit: Payer: Medicare PPO

## 2021-12-12 NOTE — Progress Notes (Signed)
Received call from [facility] in [place] who stated they are not able to provided pt with [facility] RN for wound care.  RN placed referral to [facility] in [place] and successfully faxed referral to [phone].

## 2021-12-13 ENCOUNTER — Encounter: Payer: Self-pay | Admitting: *Deleted

## 2021-12-13 NOTE — Progress Notes (Signed)
Received call from [facility] stating they are not able to accept pt for [facility] RN at this time.  RN placed call to [facility] to see if they would be able to accept pt.  Sherill RN with [employer] stated they did not have any HH RN's available at this time but was able to reach out to one of her contacts with [facility] in [place] who stated they would accept pt and could find the pt details in epic.  RN notified pt.

## 2021-12-14 ENCOUNTER — Telehealth: Payer: Self-pay

## 2021-12-14 NOTE — Telephone Encounter (Signed)
Scheduling message sent to cancel Palliative Care apt on 01/02/22 and reschedule on 12/19/26 due to transportation issues.

## 2021-12-18 MED FILL — Dexamethasone Sodium Phosphate Inj 100 MG/10ML: INTRAMUSCULAR | Qty: 1 | Status: AC

## 2021-12-18 NOTE — Progress Notes (Signed)
Patient Care Team: Nicholas Lose, MD as PCP - General (Hematology and Oncology)  DIAGNOSIS:    ICD-10-CM   1. Malignant neoplasm of upper-outer quadrant of right breast in female, estrogen receptor positive ([place])  C50.411    Z17.0       SUMMARY OF ONCOLOGIC HISTORY: Oncology History  Breast cancer of upper-outer quadrant of right female breast ([place])  09/21/2010 Mammogram   right breast linear and segmental pleomorphic calcifications from 4:00 to 6:00 position ultrasound revealed 1.6 cm and 1.1 cm masses   09/27/2010 Initial Biopsy   ultrasound-guided biopsy of all masses showed DCIS grade 2; patient went to cancer treatment centers of [place] for second opinion and delayed therapy   01/26/2011 Surgery   right mastectomy followed by reconstruction: 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 But she could not afford these trips.   01/08/2014 Breast MRI   right breast multiple enhancing masses within the soft tissues largest 5.6 cm in wall skin surface and the capsule of the silicone prosthesis, contiguous nodules involving in inferomedial breast and tired and subcentimeter nodules across the midline   08/29/2015 Surgery   Right mastectomy: IDC with invol of skin and skin ulceration, breast capsule inv cancer, superior medial margin positive, 1/1 LN positive, grade 3, 14.3 cm, 3.5 cm, ALI, chest wall involv, ER 90-100%, PR 80-90%, HER-2 negative, Ki 67 40-50% T4CN1 (St 3B)   03/05/2016 -  Anti-estrogen oral therapy   Tamoxifen 20 mg daily stopped due to headache  and uncontrolled hypertension. Decrease to 10 mg daily 04/03/2016, stopped June 2017 and took an estrogen metabolizer over-the-counter; started Aromasin April 2018 from a [place] physician, Zoladex started on 05/2017 and switched to Letrozole in 09/2017   01/04/2020 Surgery   Patient presented to the ED on 01/04/20 for worsening lower extremity numbness and underwent a decompressive laminectomy with tumor resection at T9 that was complicated with acute blood loss anemia and leukocytosis.   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 with above-mentioned history of metastatic breast cancer currently on chemotherapy with Enhertu. She presents to the clinic today for follow-up and treatment.  Since we started Enhertu she tells  me that the chest wall bleeding has significantly subsided.  The tumor appears to have scabbed and has had a remarkable improvement overall in her wound itself.  The subcutaneous nodules have also started to disappear in the chest wall.  She has not required any pain medication  over the past 2 months.  The lymph node in the right axilla which was a size of a golf ball is now the size of a walnut.  ALLERGIES:  is allergic to amoxicillin, penicillins, pork-derived products, penicillin g, and tamoxifen.  MEDICATIONS:  Current Outpatient Medications  Medication Sig Dispense Refill   acetaminophen (TYLENOL) 500 MG tablet Take 2 tablets (1,000 mg total) by mouth every 6 (six) hours as needed for fever.     Ascorbic Acid (VITAMIN C PO) Take 1,000 mg by mouth daily.     atenolol (TENORMIN) 50 MG tablet Take 1 tablet (50 mg total) by mouth 2 (two) times daily. 60 tablet 3   celecoxib (CELEBREX) 200 MG capsule Take 1 capsule (200 mg total) by mouth at bedtime. 30 capsule 3   Cholecalciferol (VITAMIN D3) 125 MCG (5000 UT) CAPS Take 1 capsule (5,000 Units total) by mouth daily. 30 capsule 3   diazepam (VALIUM) 5 MG tablet Take 1 tablet (5 mg total) by mouth every 12 (twelve) hours as needed for anxiety or muscle spasms (sleep). 60 tablet 0   enoxaparin (LOVENOX) 40 MG/0.4ML injection Inject 0.4 mLs (40 mg total) into the skin daily. 24 mL 0   lidocaine-prilocaine (EMLA) cream APPLY TOPICALLY AS NEEDED. (Patient taking differently: Apply 1 application topically See admin instructions. Use with zoladex injection) 30 g 1   lip balm (CARMEX) ointment Apply 1 application topically as needed for lip care (cold sores). 7 g 0   megestrol (MEGACE ES) 625 MG/5ML suspension Take 5 mLs (625 mg total) by mouth daily. 150 mL 3   metroNIDAZOLE (METROGEL) 0.75 % gel Apply topically 2 (two) times daily. 45 g 0   morphine (MSIR) 30 MG tablet Take 1 tablet (30 mg total) by mouth every 4 (four) hours as needed for severe pain. 120 tablet 0   Multiple Vitamins-Minerals (MULTIVITAMIN WITH MINERALS) tablet FLORADIX Supplement-Iron +minerals     Nutritional Supplements (,FEEDING SUPPLEMENT, PROSOURCE PLUS) liquid Take 30 mLs by mouth 4 (four) times daily - after meals and at bedtime.     ondansetron  (ZOFRAN) 8 MG tablet Take by mouth.     oxyCODONE ER (XTAMPZA ER) 9 MG C12A Take 1 capsule by mouth 2 times daily. 60 capsule 0   polyethylene glycol (MIRALAX / GLYCOLAX) 17 g packet Take 17 g by mouth daily. 30 each 0   potassium chloride SA (KLOR-CON M) 20 MEQ tablet Take 1 tablet (20 mEq total) by mouth daily. 30 tablet 0   pregabalin (LYRICA) 50 MG capsule Take 1 capsule (50 mg total) by mouth at bedtime. 30 capsule 6   Silver Hydrogel GEL Apply to wound bed as directed with each dressing change. 44.4 mL 0   No current facility-administered medications for this visit.   Facility-Administered Medications Ordered in Other Visits  Medication Dose Route Frequency Provider Last Rate Last Admin   acetaminophen (TYLENOL) tablet 650 mg  650 mg Oral Once Nicholas Lose, MD       dexamethasone (DECADRON) 10 mg in sodium chloride 0.9 % 50 mL IVPB  10 mg Intravenous Once Nicholas Lose, MD       dextrose 5 % solution   Intravenous Once Nicholas Lose,  MD       diphenhydrAMINE (BENADRYL) capsule 25 mg  25 mg Oral Once Nicholas Lose, MD       fam-trastuzumab deruxtecan-nxki (ENHERTU) 300 mg in dextrose 5 % 100 mL chemo infusion  5.49 mg/kg (Treatment Plan Recorded) Intravenous Once Nicholas Lose, MD       palonosetron (ALOXI) injection 0.25 mg  0.25 mg Intravenous Once Nicholas Lose, MD        PHYSICAL EXAMINATION: ECOG PERFORMANCE STATUS: 1 - Symptomatic but completely ambulatory  Vitals:   12/19/21 1008  BP: (!) 145/91  Pulse: 83  Resp: 18  Temp: (!) 97.2 F (36.2 C)  SpO2: 98%   Filed Weights   12/19/21 1008  Weight: 124 lb 11.2 oz (56.6 kg)    LABORATORY DATA:  I have reviewed the data as listed CMP Latest Ref Rng & Units 12/19/2021 11/28/2021 11/07/2021  Glucose 70 - 99 mg/dL 111(H) 76 122(H)  BUN 6 - 20 mg/dL 14 10 14   Creatinine 0.60 - 1.20 mg/dL 0.74 0.69 0.80  Sodium 135 - 145 mmol/L 142 140 140  Potassium 3.5 - 5.1 mmol/L 3.9 3.9 3.7  Chloride 98 - 111 mmol/L 110 106 110  CO2 22  - 32 mmol/L 25 29 23   Calcium 8.9 - 10.3 mg/dL 9.1 9.7 9.0  Total Protein 6.5 - 8.1 g/dL 6.5 7.2 7.0  Total Bilirubin 0.2 - 1.6 mg/dL 0.3 0.3 0.3  Alkaline Phos 38 - 126 U/L 138(H) 145(H) 111  AST 11 - 38 U/L 39(H) 39 28  ALT 10 - 47 U/L 40 39 13    Lab Results  Component Value Date   WBC 3.4 (L) 12/19/2021   HGB 12.2 12/19/2021   HCT 37.9 12/19/2021   MCV 90.5 12/19/2021   PLT 306 12/19/2021   NEUTROABS 2.3 12/19/2021    ASSESSMENT & PLAN:  Breast cancer of upper-outer quadrant of right female breast ([place]) Recurrent right breast cancer initially DCIS treated with mastectomy followed by reconstruction and later developed chest wall recurrence in 2013 ER/PR positive HER-2 negative, patient underwent alternative therapies in [place] but could not afford the trips and hence she has not been on any breast cancer therapy for a long time.   Treatment summary: 1. Right mastectomy 08/29/2015 at [facility] (Dr.Singh): IDC with invol of skin and skin ulceration, breast capsule inv cancer, superior medial margin positive, 1/1 LN positive, grade 3, 14.3 cm, 3.5 cm, ALI, chest wall involv, ER 90-100%, PR 80-90%, HER-2 negative, Ki 67 40-50% T4CN1 (St 3B) 2. Patient started tamoxifen but developed severe headache with severe hypertension and tamoxifen was discontinued 03/17/2016.  3. 02/2017 started on Exemestane, Zoladex started 05/29/2017 4.  Hospitalization for paraplegia: T9 vertebral body compression status post decompressive laminectomy and tumor resection 01/04/2020 5.  Palliative radiation to the thoracic spine 01/28/2020 6.  Ibrance with letrozole started April 2021 discontinued 07/24/2021 7.  Enhertu started 07/31/2021 every 3 weeks  ----------------------------------------------------------------------------------------------------------------- 05/11/2021: CT angiogram: Metastatic breast cancer soft tissue breast masses greater on the right, axillary supraclavicular lymphadenopathy, multiple bone  metastases    Hospitalization 08/25/2021-09/05/2021: Worsening right chest wall pain and infection (patient has appointments with palliative care as well as infectious disease)   Current treatment: Enhertu started 10/17/2021, today cycle 4 Toxicities: Tolerating it extremely well without any major problems. Denies any shortness of breath exertion. Clinically she is responding very well to treatment. Home health request: Finally we are able to get home health to see her soon   Our plan is  to perform scans in March.  She is now living with her brother in [place] who is bringing her for her treatments. Return to clinic every 3 weeks for chemotherapy    No orders of the defined types were placed in this encounter.  The patient has a good understanding of the overall plan. she agrees with it. she will call with any problems that may develop before the next visit here.  Total time spent: 30 mins including face to face time and time spent for planning, charting and coordination of care  Rulon Eisenmenger, MD, MPH 12/19/2021  I, Thana Ates, am acting as scribe for Dr. Nicholas Lose.  I have reviewed the above documentation for accuracy and completeness, and I agree with the above.

## 2021-12-19 ENCOUNTER — Other Ambulatory Visit (HOSPITAL_COMMUNITY): Payer: Self-pay

## 2021-12-19 ENCOUNTER — Inpatient Hospital Stay: Payer: Medicare PPO | Attending: Hematology and Oncology | Admitting: Hematology and Oncology

## 2021-12-19 ENCOUNTER — Ambulatory Visit: Payer: Medicare PPO

## 2021-12-19 ENCOUNTER — Inpatient Hospital Stay: Payer: Medicare PPO

## 2021-12-19 ENCOUNTER — Inpatient Hospital Stay: Payer: Medicare PPO | Admitting: Nurse Practitioner

## 2021-12-19 ENCOUNTER — Other Ambulatory Visit: Payer: Self-pay

## 2021-12-19 DIAGNOSIS — Z170 Estrogen receptor positive status [ER+]: Secondary | ICD-10-CM

## 2021-12-19 DIAGNOSIS — G8220 Paraplegia, unspecified: Secondary | ICD-10-CM | POA: Insufficient documentation

## 2021-12-19 DIAGNOSIS — C7949 Secondary malignant neoplasm of other parts of nervous system: Secondary | ICD-10-CM

## 2021-12-19 DIAGNOSIS — Z923 Personal history of irradiation: Secondary | ICD-10-CM | POA: Insufficient documentation

## 2021-12-19 DIAGNOSIS — Z9011 Acquired absence of right breast and nipple: Secondary | ICD-10-CM | POA: Insufficient documentation

## 2021-12-19 DIAGNOSIS — C50919 Malignant neoplasm of unspecified site of unspecified female breast: Secondary | ICD-10-CM

## 2021-12-19 DIAGNOSIS — Z5112 Encounter for antineoplastic immunotherapy: Secondary | ICD-10-CM | POA: Diagnosis not present

## 2021-12-19 DIAGNOSIS — Z79899 Other long term (current) drug therapy: Secondary | ICD-10-CM | POA: Diagnosis not present

## 2021-12-19 DIAGNOSIS — C50411 Malignant neoplasm of upper-outer quadrant of right female breast: Secondary | ICD-10-CM | POA: Diagnosis not present

## 2021-12-19 DIAGNOSIS — C7951 Secondary malignant neoplasm of bone: Secondary | ICD-10-CM | POA: Insufficient documentation

## 2021-12-19 LAB — CBC WITH DIFFERENTIAL (CANCER CENTER ONLY)
Abs Immature Granulocytes: 0.01 10*3/uL (ref 0.00–0.07)
Basophils Absolute: 0 10*3/uL (ref 0.0–0.1)
Basophils Relative: 1 %
Eosinophils Absolute: 0 10*3/uL (ref 0.0–0.5)
Eosinophils Relative: 1 %
HCT: 37.9 % (ref 36.0–46.0)
Hemoglobin: 12.2 g/dL (ref 12.0–15.0)
Immature Granulocytes: 0 %
Lymphocytes Relative: 21 %
Lymphs Abs: 0.7 10*3/uL (ref 0.7–4.0)
MCH: 29.1 pg (ref 26.0–34.0)
MCHC: 32.2 g/dL (ref 30.0–36.0)
MCV: 90.5 fL (ref 80.0–100.0)
Monocytes Absolute: 0.4 10*3/uL (ref 0.1–1.0)
Monocytes Relative: 11 %
Neutro Abs: 2.3 10*3/uL (ref 1.7–7.7)
Neutrophils Relative %: 66 %
Platelet Count: 306 10*3/uL (ref 150–400)
RBC: 4.19 MIL/uL (ref 3.87–5.11)
RDW: 16.8 % — ABNORMAL HIGH (ref 11.5–15.5)
WBC Count: 3.4 10*3/uL — ABNORMAL LOW (ref 4.0–10.5)
nRBC: 0 % (ref 0.0–0.2)

## 2021-12-19 LAB — CMP (CANCER CENTER ONLY)
ALT: 40 U/L (ref 10–47)
AST: 39 U/L — ABNORMAL HIGH (ref 11–38)
Albumin: 3.8 g/dL (ref 3.5–5.0)
Alkaline Phosphatase: 138 U/L — ABNORMAL HIGH (ref 38–126)
Anion gap: 7 (ref 5–15)
BUN: 14 mg/dL (ref 6–20)
CO2: 25 mmol/L (ref 22–32)
Calcium: 9.1 mg/dL (ref 8.9–10.3)
Chloride: 110 mmol/L (ref 98–111)
Creatinine: 0.74 mg/dL (ref 0.60–1.20)
GFR, Estimated: >60 mL/min (ref >60)
Glucose, Bld: 111 mg/dL — ABNORMAL HIGH (ref 70–99)
Potassium: 3.9 mmol/L (ref 3.5–5.1)
Sodium: 142 mmol/L (ref 135–145)
Total Bilirubin: 0.3 mg/dL (ref 0.2–1.6)
Total Protein: 6.5 g/dL (ref 6.5–8.1)

## 2021-12-19 MED ORDER — ATENOLOL 50 MG PO TABS
50.0000 mg | ORAL_TABLET | Freq: Two times a day (BID) | ORAL | 3 refills | Status: DC
  Filled 2021-12-19: qty 60, 30d supply, fill #0

## 2021-12-19 MED ORDER — ACETAMINOPHEN 325 MG PO TABS
650.0000 mg | ORAL_TABLET | Freq: Once | ORAL | Status: AC
  Administered 2021-12-19: 650 mg via ORAL
  Filled 2021-12-19: qty 2

## 2021-12-19 MED ORDER — PALONOSETRON HCL INJECTION 0.25 MG/5ML
0.2500 mg | Freq: Once | INTRAVENOUS | Status: AC
  Administered 2021-12-19: 0.25 mg via INTRAVENOUS
  Filled 2021-12-19: qty 5

## 2021-12-19 MED ORDER — FAM-TRASTUZUMAB DERUXTECAN-NXKI CHEMO 100 MG IV SOLR
5.4900 mg/kg | Freq: Once | INTRAVENOUS | Status: AC
  Administered 2021-12-19: 300 mg via INTRAVENOUS
  Filled 2021-12-19: qty 15

## 2021-12-19 MED ORDER — SODIUM CHLORIDE 0.9 % IV SOLN
10.0000 mg | Freq: Once | INTRAVENOUS | Status: AC
  Administered 2021-12-19: 10 mg via INTRAVENOUS
  Filled 2021-12-19: qty 10

## 2021-12-19 MED ORDER — CELECOXIB 200 MG PO CAPS
200.0000 mg | ORAL_CAPSULE | Freq: Every day | ORAL | 3 refills | Status: DC
  Filled 2021-12-19: qty 30, 30d supply, fill #0

## 2021-12-19 MED ORDER — DIPHENHYDRAMINE HCL 25 MG PO CAPS
25.0000 mg | ORAL_CAPSULE | Freq: Once | ORAL | Status: AC
  Administered 2021-12-19: 25 mg via ORAL
  Filled 2021-12-19: qty 1

## 2021-12-19 MED ORDER — DEXTROSE 5 % IV SOLN: Freq: Once | INTRAVENOUS | Status: AC

## 2021-12-19 NOTE — Assessment & Plan Note (Signed)
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  Treatment summary: 1.Right mastectomy 08/29/2015 at [facility] (Dr.Singh): IDC with invol of skin and skin ulceration, breast capsule inv cancer, superior medial margin positive, 1/1 LN positive, grade 3, 14.3 cm, 3.5 cm, ALI, chest wall involv, ER 90-100%, PR 80-90%, HER-2 negative, Ki 67 40-50% T4CN1 (St 3B) 2.Patient started tamoxifen but developed severe headache with severe hypertension and tamoxifen was discontinued 03/17/2016. 3.02/2017 started on Exemestane, Zoladex started 05/29/2017 4.Hospitalization for paraplegia: T9 vertebral body compression status post decompressive laminectomy and tumor resection 01/04/2020 5.Palliative radiation to the thoracic spine 01/28/2020 6.Ibrance with letrozole started April 2021discontinued 07/24/2021 7.Enhertu started 07/31/2021 every 3 weeks  ----------------------------------------------------------------------------------------------------------------- 05/11/2021: CT angiogram: Metastatic breast cancer soft tissue breast masses greater on the right, axillary supraclavicular lymphadenopathy, multiple bone metastases  Hospitalization 08/25/2021-09/05/2021: Worsening right chest wall pain and infection(patient has appointments with palliative care as well as infectious disease)  Current treatment: Enhertu started 10/17/2021, today cycle 4 Toxicities: Tolerating it extremely well without any major problems. Denies any shortness of breath exertion.  Home health request: Patient is not ambulatory and requires assistance for transportation.  She has a wound that needs twice a day dressings.  Based on multiple reasons I request  home health to take care of her wound care issues.  Our plan is to perform scans in March. She is now living with her brother in [place] who is bringing her for her treatments. Return to clinic every 3 weeks for chemotherapy

## 2021-12-19 NOTE — Patient Instructions (Signed)
[facility]  Discharge Instructions: Thank you for choosing [facility] to provide your oncology and hematology care.   If you have a lab appointment with the [facility], please go directly to the [facility] and check in at the registration area.   Wear comfortable clothing and clothing appropriate for easy access to any Portacath or PICC line.   We strive to give you quality time with your provider. You may need to reschedule your appointment if you arrive late (15 or more minutes).  Arriving late affects you and other patients whose appointments are after yours.  Also, if you miss three or more appointments without notifying the office, you may be dismissed from the clinic at the providers discretion.      For prescription refill requests, have your pharmacy contact our office and allow 72 hours for refills to be completed.    Today you received the following chemotherapy and/or immunotherapy agents: enhertu      To help prevent nausea and vomiting after your treatment, we encourage you to take your nausea medication as directed.  BELOW ARE SYMPTOMS THAT SHOULD BE REPORTED IMMEDIATELY: *FEVER GREATER THAN 100.4 F (38 C) OR HIGHER *CHILLS OR SWEATING *NAUSEA AND VOMITING THAT IS NOT CONTROLLED WITH YOUR NAUSEA MEDICATION *UNUSUAL SHORTNESS OF BREATH *UNUSUAL BRUISING OR BLEEDING *URINARY PROBLEMS (pain or burning when urinating, or frequent urination) *BOWEL PROBLEMS (unusual diarrhea, constipation, pain near the anus) TENDERNESS IN MOUTH AND THROAT WITH OR WITHOUT PRESENCE OF ULCERS (sore throat, sores in mouth, or a toothache) UNUSUAL RASH, SWELLING OR PAIN  UNUSUAL VAGINAL DISCHARGE OR ITCHING   Items with * indicate a potential emergency and should be followed up as soon as possible or go to the Emergency Department if any problems should occur.  Please show the CHEMOTHERAPY ALERT CARD or IMMUNOTHERAPY ALERT CARD at check-in to  the Emergency Department and triage nurse.  Should you have questions after your visit or need to cancel or reschedule your appointment, please contact [facility]  Dept: [phone]  and follow the prompts.  Office hours are 8:00 a.m. to 4:30 p.m. Monday - Friday. Please note that voicemails left after 4:00 p.m. may not be returned until the following business day.  We are closed weekends and major holidays. You have access to a nurse at all times for urgent questions. Please call the main number to the clinic Dept: [phone] and follow the prompts.   For any non-urgent questions, you may also contact your provider using MyChart. We now offer e-Visits for anyone 66 and older to request care online for non-urgent symptoms. For details visit [email or web address].   Also download the MyChart app! Go to the app store, search "MyChart", open the app, select [facility], and log in with your MyChart username and password.  Due to Covid, a mask is required upon entering the hospital/clinic. If you do not have a mask, one will be given to you upon arrival. For doctor visits, patients may have 1 support person aged 51 or older with them. For treatment visits, patients cannot have anyone with them due to current Covid guidelines and our immunocompromised population.

## 2021-12-21 DIAGNOSIS — Z791 Long term (current) use of non-steroidal anti-inflammatories (NSAID): Secondary | ICD-10-CM | POA: Diagnosis not present

## 2021-12-21 DIAGNOSIS — I10 Essential (primary) hypertension: Secondary | ICD-10-CM | POA: Diagnosis not present

## 2021-12-21 DIAGNOSIS — G8929 Other chronic pain: Secondary | ICD-10-CM | POA: Diagnosis not present

## 2021-12-21 DIAGNOSIS — C50411 Malignant neoplasm of upper-outer quadrant of right female breast: Secondary | ICD-10-CM | POA: Diagnosis not present

## 2021-12-21 DIAGNOSIS — G8220 Paraplegia, unspecified: Secondary | ICD-10-CM | POA: Diagnosis not present

## 2021-12-21 DIAGNOSIS — C7951 Secondary malignant neoplasm of bone: Secondary | ICD-10-CM | POA: Diagnosis not present

## 2021-12-25 DIAGNOSIS — C7949 Secondary malignant neoplasm of other parts of nervous system: Secondary | ICD-10-CM | POA: Diagnosis not present

## 2021-12-25 DIAGNOSIS — S21109A Unspecified open wound of unspecified front wall of thorax without penetration into thoracic cavity, initial encounter: Secondary | ICD-10-CM | POA: Diagnosis not present

## 2021-12-26 DIAGNOSIS — C7951 Secondary malignant neoplasm of bone: Secondary | ICD-10-CM | POA: Diagnosis not present

## 2021-12-26 DIAGNOSIS — G8929 Other chronic pain: Secondary | ICD-10-CM | POA: Diagnosis not present

## 2021-12-26 DIAGNOSIS — I10 Essential (primary) hypertension: Secondary | ICD-10-CM | POA: Diagnosis not present

## 2021-12-26 DIAGNOSIS — G8220 Paraplegia, unspecified: Secondary | ICD-10-CM | POA: Diagnosis not present

## 2021-12-26 DIAGNOSIS — Z791 Long term (current) use of non-steroidal anti-inflammatories (NSAID): Secondary | ICD-10-CM | POA: Diagnosis not present

## 2021-12-26 DIAGNOSIS — C50411 Malignant neoplasm of upper-outer quadrant of right female breast: Secondary | ICD-10-CM | POA: Diagnosis not present

## 2021-12-28 DIAGNOSIS — Z791 Long term (current) use of non-steroidal anti-inflammatories (NSAID): Secondary | ICD-10-CM | POA: Diagnosis not present

## 2021-12-28 DIAGNOSIS — G8220 Paraplegia, unspecified: Secondary | ICD-10-CM | POA: Diagnosis not present

## 2021-12-28 DIAGNOSIS — I10 Essential (primary) hypertension: Secondary | ICD-10-CM | POA: Diagnosis not present

## 2021-12-28 DIAGNOSIS — C50411 Malignant neoplasm of upper-outer quadrant of right female breast: Secondary | ICD-10-CM | POA: Diagnosis not present

## 2021-12-28 DIAGNOSIS — G8929 Other chronic pain: Secondary | ICD-10-CM | POA: Diagnosis not present

## 2021-12-28 DIAGNOSIS — C7951 Secondary malignant neoplasm of bone: Secondary | ICD-10-CM | POA: Diagnosis not present

## 2022-01-02 ENCOUNTER — Inpatient Hospital Stay: Payer: Medicare PPO | Admitting: Nurse Practitioner

## 2022-01-02 DIAGNOSIS — Z791 Long term (current) use of non-steroidal anti-inflammatories (NSAID): Secondary | ICD-10-CM | POA: Diagnosis not present

## 2022-01-02 DIAGNOSIS — C7951 Secondary malignant neoplasm of bone: Secondary | ICD-10-CM | POA: Diagnosis not present

## 2022-01-02 DIAGNOSIS — I10 Essential (primary) hypertension: Secondary | ICD-10-CM | POA: Diagnosis not present

## 2022-01-02 DIAGNOSIS — C50411 Malignant neoplasm of upper-outer quadrant of right female breast: Secondary | ICD-10-CM | POA: Diagnosis not present

## 2022-01-02 DIAGNOSIS — G8220 Paraplegia, unspecified: Secondary | ICD-10-CM | POA: Diagnosis not present

## 2022-01-02 DIAGNOSIS — G8929 Other chronic pain: Secondary | ICD-10-CM | POA: Diagnosis not present

## 2022-01-04 DIAGNOSIS — C50411 Malignant neoplasm of upper-outer quadrant of right female breast: Secondary | ICD-10-CM | POA: Diagnosis not present

## 2022-01-04 DIAGNOSIS — Z791 Long term (current) use of non-steroidal anti-inflammatories (NSAID): Secondary | ICD-10-CM | POA: Diagnosis not present

## 2022-01-04 DIAGNOSIS — I10 Essential (primary) hypertension: Secondary | ICD-10-CM | POA: Diagnosis not present

## 2022-01-04 DIAGNOSIS — G8220 Paraplegia, unspecified: Secondary | ICD-10-CM | POA: Diagnosis not present

## 2022-01-04 DIAGNOSIS — C7951 Secondary malignant neoplasm of bone: Secondary | ICD-10-CM | POA: Diagnosis not present

## 2022-01-04 DIAGNOSIS — G8929 Other chronic pain: Secondary | ICD-10-CM | POA: Diagnosis not present

## 2022-01-09 ENCOUNTER — Inpatient Hospital Stay (HOSPITAL_BASED_OUTPATIENT_CLINIC_OR_DEPARTMENT_OTHER): Payer: Medicare PPO | Admitting: Nurse Practitioner

## 2022-01-09 ENCOUNTER — Other Ambulatory Visit: Payer: Self-pay

## 2022-01-09 ENCOUNTER — Inpatient Hospital Stay: Payer: Medicare PPO

## 2022-01-09 ENCOUNTER — Encounter: Payer: Self-pay | Admitting: *Deleted

## 2022-01-09 ENCOUNTER — Encounter: Payer: Self-pay | Admitting: Nurse Practitioner

## 2022-01-09 ENCOUNTER — Inpatient Hospital Stay: Payer: Medicare PPO | Attending: Hematology and Oncology

## 2022-01-09 VITALS — BP 164/99 | HR 78 | Temp 98.7°F | Resp 18 | Wt 124.5 lb

## 2022-01-09 DIAGNOSIS — Z170 Estrogen receptor positive status [ER+]: Secondary | ICD-10-CM

## 2022-01-09 DIAGNOSIS — R630 Anorexia: Secondary | ICD-10-CM

## 2022-01-09 DIAGNOSIS — Z9011 Acquired absence of right breast and nipple: Secondary | ICD-10-CM | POA: Insufficient documentation

## 2022-01-09 DIAGNOSIS — C50411 Malignant neoplasm of upper-outer quadrant of right female breast: Secondary | ICD-10-CM | POA: Insufficient documentation

## 2022-01-09 DIAGNOSIS — C50919 Malignant neoplasm of unspecified site of unspecified female breast: Secondary | ICD-10-CM

## 2022-01-09 DIAGNOSIS — Z923 Personal history of irradiation: Secondary | ICD-10-CM | POA: Insufficient documentation

## 2022-01-09 DIAGNOSIS — Z79899 Other long term (current) drug therapy: Secondary | ICD-10-CM | POA: Diagnosis not present

## 2022-01-09 DIAGNOSIS — C7951 Secondary malignant neoplasm of bone: Secondary | ICD-10-CM | POA: Diagnosis not present

## 2022-01-09 DIAGNOSIS — R530 Neoplastic (malignant) related fatigue: Secondary | ICD-10-CM

## 2022-01-09 DIAGNOSIS — Z5112 Encounter for antineoplastic immunotherapy: Secondary | ICD-10-CM | POA: Diagnosis not present

## 2022-01-09 DIAGNOSIS — C7949 Secondary malignant neoplasm of other parts of nervous system: Secondary | ICD-10-CM

## 2022-01-09 LAB — CMP (CANCER CENTER ONLY)
ALT: 60 U/L — ABNORMAL HIGH (ref 0–44)
AST: 42 U/L — ABNORMAL HIGH (ref 15–41)
Albumin: 4 g/dL (ref 3.5–5.0)
Alkaline Phosphatase: 151 U/L — ABNORMAL HIGH (ref 38–126)
Anion gap: 5 (ref 5–15)
BUN: 22 mg/dL — ABNORMAL HIGH (ref 6–20)
CO2: 30 mmol/L (ref 22–32)
Calcium: 9.8 mg/dL (ref 8.9–10.3)
Chloride: 106 mmol/L (ref 98–111)
Creatinine: 0.69 mg/dL (ref 0.44–1.00)
GFR, Estimated: >60 mL/min (ref >60)
Glucose, Bld: 82 mg/dL (ref 70–99)
Potassium: 3.7 mmol/L (ref 3.5–5.1)
Sodium: 141 mmol/L (ref 135–145)
Total Bilirubin: 0.3 mg/dL (ref 0.3–1.2)
Total Protein: 6.9 g/dL (ref 6.5–8.1)

## 2022-01-09 LAB — CBC WITH DIFFERENTIAL (CANCER CENTER ONLY)
Abs Immature Granulocytes: 0.01 10*3/uL (ref 0.00–0.07)
Basophils Absolute: 0 10*3/uL (ref 0.0–0.1)
Basophils Relative: 1 %
Eosinophils Absolute: 0 10*3/uL (ref 0.0–0.5)
Eosinophils Relative: 1 %
HCT: 43.1 % (ref 36.0–46.0)
Hemoglobin: 14.1 g/dL (ref 12.0–15.0)
Immature Granulocytes: 0 %
Lymphocytes Relative: 27 %
Lymphs Abs: 0.8 10*3/uL (ref 0.7–4.0)
MCH: 29.2 pg (ref 26.0–34.0)
MCHC: 32.7 g/dL (ref 30.0–36.0)
MCV: 89.2 fL (ref 80.0–100.0)
Monocytes Absolute: 0.3 10*3/uL (ref 0.1–1.0)
Monocytes Relative: 12 %
Neutro Abs: 1.7 10*3/uL (ref 1.7–7.7)
Neutrophils Relative %: 59 %
Platelet Count: 300 10*3/uL (ref 150–400)
RBC: 4.83 MIL/uL (ref 3.87–5.11)
RDW: 16.3 % — ABNORMAL HIGH (ref 11.5–15.5)
WBC Count: 2.9 10*3/uL — ABNORMAL LOW (ref 4.0–10.5)
nRBC: 0 % (ref 0.0–0.2)

## 2022-01-09 MED ORDER — ACETAMINOPHEN 325 MG PO TABS
650.0000 mg | ORAL_TABLET | Freq: Once | ORAL | Status: AC
  Administered 2022-01-09: 650 mg via ORAL
  Filled 2022-01-09: qty 2

## 2022-01-09 MED ORDER — DIPHENHYDRAMINE HCL 25 MG PO CAPS
25.0000 mg | ORAL_CAPSULE | Freq: Once | ORAL | Status: AC
  Administered 2022-01-09: 25 mg via ORAL
  Filled 2022-01-09: qty 1

## 2022-01-09 MED ORDER — PALONOSETRON HCL INJECTION 0.25 MG/5ML
0.2500 mg | Freq: Once | INTRAVENOUS | Status: AC
  Administered 2022-01-09: 0.25 mg via INTRAVENOUS
  Filled 2022-01-09: qty 5

## 2022-01-09 MED ORDER — FAM-TRASTUZUMAB DERUXTECAN-NXKI CHEMO 100 MG IV SOLR
5.4900 mg/kg | Freq: Once | INTRAVENOUS | Status: AC
  Administered 2022-01-09: 300 mg via INTRAVENOUS
  Filled 2022-01-09: qty 15

## 2022-01-09 MED ORDER — DEXTROSE 5 % IV SOLN: Freq: Once | INTRAVENOUS | Status: AC

## 2022-01-09 MED ORDER — SODIUM CHLORIDE 0.9 % IV SOLN
10.0000 mg | Freq: Once | INTRAVENOUS | Status: AC
  Administered 2022-01-09: 10 mg via INTRAVENOUS
  Filled 2022-01-09: qty 10

## 2022-01-09 NOTE — Progress Notes (Signed)
Per MD okay to proceed with pre medications while CMP is pending today.  ?

## 2022-01-09 NOTE — Patient Instructions (Signed)
[facility]  Discharge Instructions: ?Thank you for choosing [facility] to provide your oncology and hematology care.  ? ?If you have a lab appointment with the [facility], please go directly to the [facility] and check in at the registration area. ?  ?Wear comfortable clothing and clothing appropriate for easy access to any Portacath or PICC line.  ? ?We strive to give you quality time with your provider. You may need to reschedule your appointment if you arrive late (15 or more minutes).  Arriving late affects you and other patients whose appointments are after yours.  Also, if you miss three or more appointments without notifying the office, you may be dismissed from the clinic at the provider?s discretion.    ?  ?For prescription refill requests, have your pharmacy contact our office and allow 72 hours for refills to be completed.   ? ?Today you received the following chemotherapy and/or immunotherapy agents: enhertu    ?  ?To help prevent nausea and vomiting after your treatment, we encourage you to take your nausea medication as directed. ? ?BELOW ARE SYMPTOMS THAT SHOULD BE REPORTED IMMEDIATELY: ?*FEVER GREATER THAN 100.4 F (38 ?C) OR HIGHER ?*CHILLS OR SWEATING ?*NAUSEA AND VOMITING THAT IS NOT CONTROLLED WITH YOUR NAUSEA MEDICATION ?*UNUSUAL SHORTNESS OF BREATH ?*UNUSUAL BRUISING OR BLEEDING ?*URINARY PROBLEMS (pain or burning when urinating, or frequent urination) ?*BOWEL PROBLEMS (unusual diarrhea, constipation, pain near the anus) ?TENDERNESS IN MOUTH AND THROAT WITH OR WITHOUT PRESENCE OF ULCERS (sore throat, sores in mouth, or a toothache) ?UNUSUAL RASH, SWELLING OR PAIN  ?UNUSUAL VAGINAL DISCHARGE OR ITCHING  ? ?Items with * indicate a potential emergency and should be followed up as soon as possible or go to the Emergency Department if any problems should occur. ? ?Please show the CHEMOTHERAPY ALERT CARD or IMMUNOTHERAPY ALERT CARD at check-in to  the Emergency Department and triage nurse. ? ?Should you have questions after your visit or need to cancel or reschedule your appointment, please contact [facility]  Dept: [phone]  and follow the prompts.  Office hours are 8:00 a.m. to 4:30 p.m. Monday - Friday. Please note that voicemails left after 4:00 p.m. may not be returned until the following business day.  We are closed weekends and major holidays. You have access to a nurse at all times for urgent questions. Please call the main number to the clinic Dept: [phone] and follow the prompts. ? ? ?For any non-urgent questions, you may also contact your provider using MyChart. We now offer e-Visits for anyone 45 and older to request care online for non-urgent symptoms. For details visit [email or web address]. ?  ?Also download the MyChart app! Go to the app store, search "MyChart", open the app, select [facility], and log in with your MyChart username and password. ? ?Due to Covid, a mask is required upon entering the hospital/clinic. If you do not have a mask, one will be given to you upon arrival. For doctor visits, patients may have 1 support person aged 34 or older with them. For treatment visits, patients cannot have anyone with them due to current Covid guidelines and our immunocompromised population.  ? ?

## 2022-01-09 NOTE — Progress Notes (Signed)
? ?  ?Palliative Medicine ?[place]  ?[phone] [phone] ? ? ?Name: Misty Arnold ?Date: 01/09/2022 ?MRN: [id number]  ?DOB: 1971/09/27 ? ?Patient Care Team: ?Nicholas Lose, MD as PCP - General (Hematology and Oncology)  ? ? ?INTERVAL HISTORY: ?Misty Arnold is a 51 y.o. female with right metastatic breast cancer s/p mastectomy, large right chest wound secondary to necrotic mass, weankess due to T9 vertebral compression s/p tumor resection and decompressive laminectomy, and hypertension. Patient did not receive treatment for some time after seeking care in [place]. Currently receiving Enhertu treatment. Palliative following for symptom management and goals of care.  ? ?SOCIAL HISTORY:    ? reports that she has never smoked. She has never used smokeless tobacco. She reports that she does not currently use alcohol. She reports that she does not use drugs. ? ?ADVANCE DIRECTIVES:  ?Advance directives completed during recent hospitalization 08/27/2021.  Documents are on file and have been reviewed personally and [name]. ? ?CODE STATUS: Full code ? ?PAST MEDICAL HISTORY: ?Past Medical History:  ?Diagnosis Date  ? Abnormal uterine bleeding   ? Anemia   ? Breast cancer ([place])   ? Depression   ? Fibroid   ? Hormone disorder   ? Hypertension   ? Infertility, female   ? ?ALLERGIES:  is allergic to amoxicillin, penicillins, pork-derived products, penicillin g, and tamoxifen. ? ?MEDICATIONS:  ?Current Outpatient Medications  ?Medication Sig Dispense Refill  ? acetaminophen (TYLENOL) 500 MG tablet Take 2 tablets (1,000 mg total) by mouth every 6 (six) hours as needed for fever.    ? Ascorbic Acid (VITAMIN C PO) Take 1,000 mg by mouth daily.    ? atenolol (TENORMIN) 50 MG tablet Take 1 tablet (50 mg total) by mouth 2 (two) times daily. 60 tablet 3  ? celecoxib (CELEBREX) 200 MG capsule Take 1 capsule (200 mg total) by mouth at bedtime. 30 capsule 3  ? Cholecalciferol (VITAMIN D3) 125 MCG  (5000 UT) CAPS Take 1 capsule (5,000 Units total) by mouth daily. 30 capsule 3  ? diazepam (VALIUM) 5 MG tablet Take 1 tablet (5 mg total) by mouth every 12 (twelve) hours as needed for anxiety or muscle spasms (sleep). 60 tablet 0  ? enoxaparin (LOVENOX) 40 MG/0.4ML injection Inject 0.4 mLs (40 mg total) into the skin daily. 24 mL 0  ? lip balm (CARMEX) ointment Apply 1 application topically as needed for lip care (cold sores). 7 g 0  ? megestrol (MEGACE ES) 625 MG/5ML suspension Take 5 mLs (625 mg total) by mouth daily. 150 mL 3  ? metroNIDAZOLE (METROGEL) 0.75 % gel Apply topically 2 (two) times daily. 45 g 0  ? morphine (MSIR) 30 MG tablet Take 1 tablet (30 mg total) by mouth every 4 (four) hours as needed for severe pain. 120 tablet 0  ? Multiple Vitamins-Minerals (MULTIVITAMIN WITH MINERALS) tablet FLORADIX Supplement-Iron +minerals    ? Nutritional Supplements (,FEEDING SUPPLEMENT, PROSOURCE PLUS) liquid Take 30 mLs by mouth 4 (four) times daily - after meals and at bedtime.    ? ondansetron (ZOFRAN) 8 MG tablet Take by mouth.    ? oxyCODONE ER (XTAMPZA ER) 9 MG C12A Take 1 capsule by mouth 2 times daily. 60 capsule 0  ? polyethylene glycol (MIRALAX / GLYCOLAX) 17 g packet Take 17 g by mouth daily. 30 each 0  ? potassium chloride SA (KLOR-CON M) 20 MEQ tablet Take 1 tablet (20 mEq total) by mouth daily. 30 tablet 0  ? pregabalin (LYRICA)  50 MG capsule Take 1 capsule (50 mg total) by mouth at bedtime. 30 capsule 6  ? Silver Hydrogel GEL Apply to wound bed as directed with each dressing change. 44.4 mL 0  ? ?No current facility-administered medications for this visit.  ? ?Facility-Administered Medications Ordered in Other Visits  ?Medication Dose Route Frequency Provider Last Rate Last Admin  ? fam-trastuzumab deruxtecan-nxki (ENHERTU) 300 mg in dextrose 5 % 100 mL chemo infusion  5.49 mg/kg (Treatment Plan Recorded) Intravenous Once Nicholas Lose, MD      ? ? ?VITAL SIGNS: ?There were no vitals taken for this  visit. ?There were no vitals filed for this visit.  ?Estimated body mass index is 20.72 kg/m? as calculated from the following: ?  Height as of 12/19/21: '5\' 5"'$  (1.651 m). ?  Weight as of an earlier encounter on 01/09/22: 124 lb 8 oz (56.5 kg). ? ?PERFORMANCE STATUS (ECOG) : 0 - Asymptomatic ? ? ?Physical Exam ?General: NAD, well developed  ?Resp: normal breathing pattern  ?Neurological: AAO x3 ? ?IMPRESSION: ? ?I saw Misty Arnold today for follow-up during her infusion. No acute distress noted. Patient looks great! Expresses she is doing well. Is living with her brother who is assisting with any care needs. Tolerating Enhertu treatments. Patient is appreciative of ongoing improvement and quality of life.  ? ?Misty Arnold pain is resolved. She is not having to take any form of pain medication other than occasional Tylenol use for minor aches. Appetite is improving. Is drinking boost protein drinks twice daily. Provided patient with case of boost today. Weight today is 124lbs up from 118lbs on 1/24.  ? ?Denies constipation, diarrhea, nausea, or vomiting.  ? ?We discussed her progression and improvement. Patient knows to contact our office as needed. We will continue to follow and be available. I am glad she is much improved.  ? ? ?PLAN: ?No refills needed on today.  Pain is resolved. Occasional Tylenol use for minor aches and headaches. ?Appetite improving. Weight has increased and remained stable. 1-2 boost daily. Patient supplied with case from our office. ?I will plan to see her back in 6 weeks. ? ?Patient expressed understanding and was in agreement with this plan. She also understands that She can call the clinic at any time with any questions, concerns, or complaints.  ? ?Time Total: 15 min  ? ?Visit consisted of counseling and education dealing with the complex and emotionally intense issues of symptom management and palliative care in the setting of serious and potentially life-threatening illness.Greater than 50%  of  this time was spent counseling and coordinating care related to the above assessment and plan. ? ?[name], AGPCNP-BC  ?[place] ? ? ?

## 2022-01-10 ENCOUNTER — Inpatient Hospital Stay: Payer: Medicare PPO | Admitting: Hematology and Oncology

## 2022-01-10 ENCOUNTER — Inpatient Hospital Stay: Payer: Medicare PPO

## 2022-01-10 ENCOUNTER — Inpatient Hospital Stay: Payer: Medicare PPO | Admitting: Nurse Practitioner

## 2022-01-11 DIAGNOSIS — G8220 Paraplegia, unspecified: Secondary | ICD-10-CM | POA: Diagnosis not present

## 2022-01-11 DIAGNOSIS — C7951 Secondary malignant neoplasm of bone: Secondary | ICD-10-CM | POA: Diagnosis not present

## 2022-01-11 DIAGNOSIS — Z791 Long term (current) use of non-steroidal anti-inflammatories (NSAID): Secondary | ICD-10-CM | POA: Diagnosis not present

## 2022-01-11 DIAGNOSIS — I10 Essential (primary) hypertension: Secondary | ICD-10-CM | POA: Diagnosis not present

## 2022-01-11 DIAGNOSIS — G8929 Other chronic pain: Secondary | ICD-10-CM | POA: Diagnosis not present

## 2022-01-11 DIAGNOSIS — C50411 Malignant neoplasm of upper-outer quadrant of right female breast: Secondary | ICD-10-CM | POA: Diagnosis not present

## 2022-01-15 DIAGNOSIS — Z791 Long term (current) use of non-steroidal anti-inflammatories (NSAID): Secondary | ICD-10-CM | POA: Diagnosis not present

## 2022-01-15 DIAGNOSIS — G8929 Other chronic pain: Secondary | ICD-10-CM | POA: Diagnosis not present

## 2022-01-15 DIAGNOSIS — C7951 Secondary malignant neoplasm of bone: Secondary | ICD-10-CM | POA: Diagnosis not present

## 2022-01-15 DIAGNOSIS — G8220 Paraplegia, unspecified: Secondary | ICD-10-CM | POA: Diagnosis not present

## 2022-01-15 DIAGNOSIS — I10 Essential (primary) hypertension: Secondary | ICD-10-CM | POA: Diagnosis not present

## 2022-01-15 DIAGNOSIS — C50411 Malignant neoplasm of upper-outer quadrant of right female breast: Secondary | ICD-10-CM | POA: Diagnosis not present

## 2022-01-18 DIAGNOSIS — Z791 Long term (current) use of non-steroidal anti-inflammatories (NSAID): Secondary | ICD-10-CM | POA: Diagnosis not present

## 2022-01-18 DIAGNOSIS — I10 Essential (primary) hypertension: Secondary | ICD-10-CM | POA: Diagnosis not present

## 2022-01-18 DIAGNOSIS — C50411 Malignant neoplasm of upper-outer quadrant of right female breast: Secondary | ICD-10-CM | POA: Diagnosis not present

## 2022-01-18 DIAGNOSIS — G8929 Other chronic pain: Secondary | ICD-10-CM | POA: Diagnosis not present

## 2022-01-18 DIAGNOSIS — C7951 Secondary malignant neoplasm of bone: Secondary | ICD-10-CM | POA: Diagnosis not present

## 2022-01-18 DIAGNOSIS — G8220 Paraplegia, unspecified: Secondary | ICD-10-CM | POA: Diagnosis not present

## 2022-01-25 DIAGNOSIS — C50411 Malignant neoplasm of upper-outer quadrant of right female breast: Secondary | ICD-10-CM | POA: Diagnosis not present

## 2022-01-25 DIAGNOSIS — G8220 Paraplegia, unspecified: Secondary | ICD-10-CM | POA: Diagnosis not present

## 2022-01-25 DIAGNOSIS — C7951 Secondary malignant neoplasm of bone: Secondary | ICD-10-CM | POA: Diagnosis not present

## 2022-01-25 DIAGNOSIS — Z791 Long term (current) use of non-steroidal anti-inflammatories (NSAID): Secondary | ICD-10-CM | POA: Diagnosis not present

## 2022-01-25 DIAGNOSIS — G8929 Other chronic pain: Secondary | ICD-10-CM | POA: Diagnosis not present

## 2022-01-25 DIAGNOSIS — I10 Essential (primary) hypertension: Secondary | ICD-10-CM | POA: Diagnosis not present

## 2022-01-26 ENCOUNTER — Ambulatory Visit (HOSPITAL_COMMUNITY)
Admission: RE | Admit: 2022-01-26 | Discharge: 2022-01-26 | Disposition: A | Discharge status: Home / Self Care | Payer: Medicare PPO | Source: Ambulatory Visit | Attending: Hematology and Oncology | Admitting: Hematology and Oncology

## 2022-01-26 ENCOUNTER — Other Ambulatory Visit: Payer: Self-pay

## 2022-01-26 DIAGNOSIS — Z170 Estrogen receptor positive status [ER+]: Secondary | ICD-10-CM | POA: Insufficient documentation

## 2022-01-26 DIAGNOSIS — C50411 Malignant neoplasm of upper-outer quadrant of right female breast: Secondary | ICD-10-CM | POA: Insufficient documentation

## 2022-01-26 DIAGNOSIS — C50919 Malignant neoplasm of unspecified site of unspecified female breast: Secondary | ICD-10-CM | POA: Insufficient documentation

## 2022-01-26 DIAGNOSIS — I700 Atherosclerosis of aorta: Secondary | ICD-10-CM | POA: Diagnosis not present

## 2022-01-26 DIAGNOSIS — K7689 Other specified diseases of liver: Secondary | ICD-10-CM | POA: Diagnosis not present

## 2022-01-26 MED ORDER — IOHEXOL 300 MG/ML  SOLN
100.0000 mL | Freq: Once | INTRAMUSCULAR | Status: AC | PRN
  Administered 2022-01-26: 100 mL via INTRAVENOUS

## 2022-01-26 MED ORDER — SODIUM CHLORIDE (PF) 0.9 % IJ SOLN
INTRAMUSCULAR | Status: AC
  Filled 2022-01-26: qty 50

## 2022-01-29 ENCOUNTER — Inpatient Hospital Stay: Payer: Medicare PPO

## 2022-01-29 DIAGNOSIS — G8220 Paraplegia, unspecified: Secondary | ICD-10-CM | POA: Diagnosis not present

## 2022-01-29 DIAGNOSIS — I10 Essential (primary) hypertension: Secondary | ICD-10-CM | POA: Diagnosis not present

## 2022-01-29 DIAGNOSIS — C50411 Malignant neoplasm of upper-outer quadrant of right female breast: Secondary | ICD-10-CM | POA: Diagnosis not present

## 2022-01-29 DIAGNOSIS — C7951 Secondary malignant neoplasm of bone: Secondary | ICD-10-CM | POA: Diagnosis not present

## 2022-01-29 DIAGNOSIS — G8929 Other chronic pain: Secondary | ICD-10-CM | POA: Diagnosis not present

## 2022-01-29 DIAGNOSIS — Z791 Long term (current) use of non-steroidal anti-inflammatories (NSAID): Secondary | ICD-10-CM | POA: Diagnosis not present

## 2022-01-30 NOTE — Progress Notes (Signed)
? ?Patient Care Team: ?Nicholas Lose, MD as PCP - General (Hematology and Oncology) ? ?DIAGNOSIS:  ?Encounter Diagnoses  ?Name Primary?  ? Metastatic breast cancer ([place]) Yes  ? Malignant neoplasm of upper-outer quadrant of right breast in female, estrogen receptor positive ([place])   ? ? ?SUMMARY OF ONCOLOGIC HISTORY: ?Oncology History  ?Breast cancer of upper-outer quadrant of right female breast ([place])  ?09/21/2010 Mammogram  ? right breast linear and segmental pleomorphic calcifications from 4:00 to 6:00 position ultrasound revealed 1.6 cm and 1.1 cm masses ?  ?09/27/2010 Initial Biopsy  ? ultrasound-guided biopsy of all masses showed DCIS grade 2; patient went to cancer treatment centers of [place] for second opinion and delayed therapy ?  ?01/26/2011 Surgery  ? right mastectomy followed by reconstruction: Invasive ductal carcinoma T1 N1 MIC M0 stage IB ER/PR positive HER-2 negative, BRCA negative, Oncotype DX low risk ?  ?05/29/2012 Procedure  ? right chest wall nodule excision done on 06/24/2012 showed metastatic carcinoma margins were positive, but CT scan no metastatic disease, recommended chemotherapy but patient refused also refused reexcision ?  ?04/28/2013 Treatment Plan Change  ? patient went to [place] for alternative treatments and took herbal medications, tonics etc. But she could not afford these trips. ?  ?01/08/2014 Breast MRI  ? right breast multiple enhancing masses within the soft tissues largest 5.6 cm in wall skin surface and the capsule of the silicone prosthesis, contiguous nodules involving in inferomedial breast and tired and subcentimeter nodules across the midline ?  ?08/29/2015 Surgery  ? Right mastectomy: IDC with invol of skin and skin ulceration, breast capsule inv cancer, superior medial margin positive, 1/1 LN positive, grade 3, 14.3 cm, 3.5 cm, ALI, chest wall involv, ER 90-100%, PR 80-90%, HER-2 negative, Ki 67 40-50% T4CN1 (St 3B) ?  ?03/05/2016 -  Anti-estrogen oral therapy  ? Tamoxifen  20 mg daily stopped due to headache and uncontrolled hypertension. Decrease to 10 mg daily 04/03/2016, stopped June 2017 and took an estrogen metabolizer over-the-counter; started Aromasin April 2018 from a [place] physician, Zoladex started on 05/2017 and switched to Letrozole in 09/2017 ?  ?01/04/2020 Surgery  ? Patient presented to the ED on 01/04/20 for worsening lower extremity numbness and underwent a decompressive laminectomy with tumor resection at T9 that was complicated with acute blood loss anemia and leukocytosis. ?  ?01/28/2020 - 02/15/2020 Radiation Therapy  ? Palliative radiation at site of thoracic tumor resection ?  ?03/03/2020 Miscellaneous  ?  Ibrance with letrozole and Zoladex ?  ? Miscellaneous  ? Guardant 360: ESR 1 mutations, PI K3 CA mutation, EGFR mutation, T p53 mutation ?  ?10/17/2021 -  Chemotherapy  ? Patient is on Treatment Plan : BREAST METASTATIC fam-trastuzumab deruxtecan-nxki (Enhertu) q21d  ?   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 Metastasis of neoplasm to spinal canal [facility]) ?  ?10/17/2021 -  Chemotherapy  ? Patient is on Treatment Plan : BREAST METASTATIC fam-trastuzumab deruxtecan-nxki (Enhertu) q21d  ?   ?Metastatic breast cancer ([place])  ?01/08/2020 Initial Diagnosis  ? Metastatic breast cancer [facility]) ?  ?10/17/2021 -  Chemotherapy  ? Patient is on Treatment Plan : BREAST METASTATIC fam-trastuzumab deruxtecan-nxki (Enhertu) q21d  ?   ? ? ?CHIEF COMPLIANT: Cycle 6 Enhertu   ? ?INTERVAL HISTORY: Misty Arnold is a 51 y.o. with above-mentioned history of metastatic breast cancer currently on chemotherapy with Enhertu. She presents to the clinic today for follow-up and treatment. She is tolerating the Enhertu. She  denies nausea and vomiting.  She reports that the tumor in the chest wall has markedly shrunk.  She is not using as much wound care products as she was using before. ? ? ?ALLERGIES:  is allergic to amoxicillin, penicillins, pork-derived  products, penicillin g, and tamoxifen. ? ?MEDICATIONS:  ?Current Outpatient Medications  ?Medication Sig Dispense Refill  ? acetaminophen (TYLENOL) 500 MG tablet Take 2 tablets (1,000 mg total) by mouth every 6 (six) hours as needed for fever.    ? Ascorbic Acid (VITAMIN C PO) Take 1,000 mg by mouth daily.    ? atenolol (TENORMIN) 50 MG tablet Take 1 tablet (50 mg total) by mouth 2 (two) times daily. 60 tablet 3  ? celecoxib (CELEBREX) 200 MG capsule Take 1 capsule (200 mg total) by mouth at bedtime. 30 capsule 3  ? Cholecalciferol (VITAMIN D3) 125 MCG (5000 UT) CAPS Take 1 capsule (5,000 Units total) by mouth daily. 30 capsule 3  ? diazepam (VALIUM) 5 MG tablet Take 1 tablet (5 mg total) by mouth every 12 (twelve) hours as needed for anxiety or muscle spasms (sleep). 60 tablet 0  ? enoxaparin (LOVENOX) 40 MG/0.4ML injection Inject 0.4 mLs (40 mg total) into the skin daily. 24 mL 0  ? lip balm (CARMEX) ointment Apply 1 application topically as needed for lip care (cold sores). 7 g 0  ? megestrol (MEGACE ES) 625 MG/5ML suspension Take 5 mLs (625 mg total) by mouth daily. 150 mL 3  ? metroNIDAZOLE (METROGEL) 0.75 % gel Apply topically 2 (two) times daily. 45 g 0  ? morphine (MSIR) 30 MG tablet Take 1 tablet (30 mg total) by mouth every 4 (four) hours as needed for severe pain. 120 tablet 0  ? Multiple Vitamins-Minerals (MULTIVITAMIN WITH MINERALS) tablet FLORADIX Supplement-Iron +minerals    ? Nutritional Supplements (,FEEDING SUPPLEMENT, PROSOURCE PLUS) liquid Take 30 mLs by mouth 4 (four) times daily - after meals and at bedtime.    ? ondansetron (ZOFRAN) 8 MG tablet Take by mouth.    ? oxyCODONE ER (XTAMPZA ER) 9 MG C12A Take 1 capsule by mouth 2 times daily. 60 capsule 0  ? polyethylene glycol (MIRALAX / GLYCOLAX) 17 g packet Take 17 g by mouth daily. 30 each 0  ? potassium chloride SA (KLOR-CON M) 20 MEQ tablet Take 1 tablet (20 mEq total) by mouth daily. 30 tablet 0  ? pregabalin (LYRICA) 50 MG capsule Take 1  capsule (50 mg total) by mouth at bedtime. 30 capsule 6  ? Silver Hydrogel GEL Apply to wound bed as directed with each dressing change. 44.4 mL 0  ? ?No current facility-administered medications for this visit.  ? ? ?PHYSICAL EXAMINATION: ?ECOG PERFORMANCE STATUS: 1 - Symptomatic but completely ambulatory ? ?Vitals:  ? 01/31/22 0918 01/31/22 0919  ?BP: (!) 196/131 (!) 190/119  ?Pulse: 69   ?Resp: 18   ?Temp: 97.9 ?F (36.6 ?C)   ?SpO2: 100%   ? ?Filed Weights  ? 01/31/22 0918  ?Weight: 130 lb 9.6 oz (59.2 kg)  ? ?  ? ?LABORATORY DATA:  ?I have reviewed the data as listed ? ?  Latest Ref Rng & Units 01/31/2022  ?  8:22 AM 01/09/2022  ?  9:11 AM 12/19/2021  ?  9:53 AM  ?CMP  ?Glucose 70 - 99 mg/dL 94   82   111    ?BUN 6 - 20 mg/dL 9   22   14     ?Creatinine 0.44 - 1.00 mg/dL 0.63  0.69   0.74    ?Sodium 135 - 145 mmol/L 141   141   142    ?Potassium 3.5 - 5.1 mmol/L 3.7   3.7   3.9    ?Chloride 98 - 111 mmol/L 107   106   110    ?CO2 22 - 32 mmol/L 26   30   25     ?Calcium 8.9 - 10.3 mg/dL 9.8   9.8   9.1    ?Total Protein 6.5 - 8.1 g/dL 7.4   6.9   6.5    ?Total Bilirubin 0.3 - 1.2 mg/dL 0.4   0.3   0.3    ?Alkaline Phos 38 - 126 U/L 163   151   138    ?AST 15 - 41 U/L 30   42   39    ?ALT 0 - 44 U/L 49   60   40    ? ? ?Lab Results  ?Component Value Date  ? WBC 2.6 (L) 01/31/2022  ? HGB 14.7 01/31/2022  ? HCT 44.0 01/31/2022  ? MCV 89.6 01/31/2022  ? PLT 296 01/31/2022  ? NEUTROABS 1.4 (L) 01/31/2022  ? ? ?ASSESSMENT & PLAN:  ?Breast cancer of upper-outer quadrant of right female breast ([place]) ?Recurrent right breast cancer initially DCIS treated with mastectomy followed by reconstruction and later developed chest wall recurrence in 2013 ER/PR positive HER-2 negative, patient underwent alternative therapies in [place] but could not afford the trips and hence she has not been on any breast cancer therapy for a long time. ?  ?Treatment summary: ?1. Right mastectomy 08/29/2015 at [facility] (Dr.Singh): IDC with invol of skin  and skin ulceration, breast capsule inv cancer, superior medial margin positive, 1/1 LN positive, grade 3, 14.3 cm, 3.5 cm, ALI, chest wall involv, ER 90-100%, PR 80-90%, HER-2 negative, Ki 67 40-50% T4CN1 (S

## 2022-01-31 ENCOUNTER — Inpatient Hospital Stay: Payer: Medicare PPO

## 2022-01-31 ENCOUNTER — Other Ambulatory Visit: Payer: Self-pay

## 2022-01-31 ENCOUNTER — Other Ambulatory Visit: Payer: Self-pay | Admitting: *Deleted

## 2022-01-31 ENCOUNTER — Inpatient Hospital Stay: Payer: Medicare PPO | Admitting: Hematology and Oncology

## 2022-01-31 ENCOUNTER — Encounter: Payer: Self-pay | Admitting: *Deleted

## 2022-01-31 VITALS — BP 190/119 | HR 69 | Temp 97.9°F | Resp 18 | Ht 65.0 in | Wt 130.6 lb

## 2022-01-31 VITALS — BP 149/98

## 2022-01-31 DIAGNOSIS — Z9011 Acquired absence of right breast and nipple: Secondary | ICD-10-CM | POA: Diagnosis not present

## 2022-01-31 DIAGNOSIS — C50411 Malignant neoplasm of upper-outer quadrant of right female breast: Secondary | ICD-10-CM

## 2022-01-31 DIAGNOSIS — Z79899 Other long term (current) drug therapy: Secondary | ICD-10-CM | POA: Diagnosis not present

## 2022-01-31 DIAGNOSIS — C50919 Malignant neoplasm of unspecified site of unspecified female breast: Secondary | ICD-10-CM

## 2022-01-31 DIAGNOSIS — Z170 Estrogen receptor positive status [ER+]: Secondary | ICD-10-CM

## 2022-01-31 DIAGNOSIS — Z5112 Encounter for antineoplastic immunotherapy: Secondary | ICD-10-CM | POA: Diagnosis not present

## 2022-01-31 DIAGNOSIS — Z923 Personal history of irradiation: Secondary | ICD-10-CM | POA: Diagnosis not present

## 2022-01-31 DIAGNOSIS — C7951 Secondary malignant neoplasm of bone: Secondary | ICD-10-CM | POA: Diagnosis not present

## 2022-01-31 DIAGNOSIS — C7949 Secondary malignant neoplasm of other parts of nervous system: Secondary | ICD-10-CM

## 2022-01-31 LAB — CMP (CANCER CENTER ONLY)
ALT: 49 U/L — ABNORMAL HIGH (ref 0–44)
AST: 30 U/L (ref 15–41)
Albumin: 4.1 g/dL (ref 3.5–5.0)
Alkaline Phosphatase: 163 U/L — ABNORMAL HIGH (ref 38–126)
Anion gap: 8 (ref 5–15)
BUN: 9 mg/dL (ref 6–20)
CO2: 26 mmol/L (ref 22–32)
Calcium: 9.8 mg/dL (ref 8.9–10.3)
Chloride: 107 mmol/L (ref 98–111)
Creatinine: 0.63 mg/dL (ref 0.44–1.00)
GFR, Estimated: >60 mL/min (ref >60)
Glucose, Bld: 94 mg/dL (ref 70–99)
Potassium: 3.7 mmol/L (ref 3.5–5.1)
Sodium: 141 mmol/L (ref 135–145)
Total Bilirubin: 0.4 mg/dL (ref 0.3–1.2)
Total Protein: 7.4 g/dL (ref 6.5–8.1)

## 2022-01-31 LAB — CBC WITH DIFFERENTIAL (CANCER CENTER ONLY)
Abs Immature Granulocytes: 0.01 10*3/uL (ref 0.00–0.07)
Basophils Absolute: 0 10*3/uL (ref 0.0–0.1)
Basophils Relative: 1 %
Eosinophils Absolute: 0 10*3/uL (ref 0.0–0.5)
Eosinophils Relative: 2 %
HCT: 44 % (ref 36.0–46.0)
Hemoglobin: 14.7 g/dL (ref 12.0–15.0)
Immature Granulocytes: 0 %
Lymphocytes Relative: 29 %
Lymphs Abs: 0.7 10*3/uL (ref 0.7–4.0)
MCH: 29.9 pg (ref 26.0–34.0)
MCHC: 33.4 g/dL (ref 30.0–36.0)
MCV: 89.6 fL (ref 80.0–100.0)
Monocytes Absolute: 0.3 10*3/uL (ref 0.1–1.0)
Monocytes Relative: 13 %
Neutro Abs: 1.4 10*3/uL — ABNORMAL LOW (ref 1.7–7.7)
Neutrophils Relative %: 55 %
Platelet Count: 296 10*3/uL (ref 150–400)
RBC: 4.91 MIL/uL (ref 3.87–5.11)
RDW: 15.6 % — ABNORMAL HIGH (ref 11.5–15.5)
WBC Count: 2.6 10*3/uL — ABNORMAL LOW (ref 4.0–10.5)
nRBC: 0 % (ref 0.0–0.2)

## 2022-01-31 MED ORDER — FAM-TRASTUZUMAB DERUXTECAN-NXKI CHEMO 100 MG IV SOLR
5.4900 mg/kg | Freq: Once | INTRAVENOUS | Status: AC
  Administered 2022-01-31: 300 mg via INTRAVENOUS
  Filled 2022-01-31: qty 15

## 2022-01-31 MED ORDER — DEXTROSE 5 % IV SOLN: Freq: Once | INTRAVENOUS | Status: AC

## 2022-01-31 MED ORDER — CLONIDINE HCL 0.1 MG PO TABS
0.1000 mg | ORAL_TABLET | Freq: Once | ORAL | Status: AC
  Administered 2022-01-31: 0.1 mg via ORAL
  Filled 2022-01-31: qty 1

## 2022-01-31 MED ORDER — ACETAMINOPHEN 325 MG PO TABS
650.0000 mg | ORAL_TABLET | Freq: Once | ORAL | Status: AC
  Administered 2022-01-31: 650 mg via ORAL
  Filled 2022-01-31: qty 2

## 2022-01-31 MED ORDER — SODIUM CHLORIDE 0.9 % IV SOLN
10.0000 mg | Freq: Once | INTRAVENOUS | Status: AC
  Administered 2022-01-31: 10 mg via INTRAVENOUS
  Filled 2022-01-31: qty 10

## 2022-01-31 MED ORDER — PALONOSETRON HCL INJECTION 0.25 MG/5ML
0.2500 mg | Freq: Once | INTRAVENOUS | Status: AC
  Administered 2022-01-31: 0.25 mg via INTRAVENOUS
  Filled 2022-01-31: qty 5

## 2022-01-31 MED ORDER — DIPHENHYDRAMINE HCL 25 MG PO CAPS
25.0000 mg | ORAL_CAPSULE | Freq: Once | ORAL | Status: AC
  Administered 2022-01-31: 25 mg via ORAL
  Filled 2022-01-31: qty 1

## 2022-01-31 NOTE — Assessment & Plan Note (Signed)
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 ?? ?Treatment summary: ?1.?Right mastectomy 08/29/2015 at [place] (Dr.Singh): IDC with invol of skin and skin ulceration, breast capsule inv cancer, superior medial margin positive, 1/1 LN positive, grade 3, 14.3 cm, 3.5 cm, ALI, chest wall involv, ER 90-100%, PR 80-90%, HER-2 negative, Ki 67 40-50% T4CN1 (St 3B) ?2.?Patient started tamoxifen but developed severe headache with severe hypertension and tamoxifen was discontinued 03/17/2016.? ?3.?02/2017 started on Exemestane, Zoladex started 05/29/2017 ?4.??Hospitalization for paraplegia: T9 vertebral body compression status post decompressive laminectomy and tumor resection 01/04/2020 ?5.??Palliative radiation to the thoracic spine 01/28/2020 ?6.??Ibrance with letrozole started April 2021?discontinued 07/24/2021 ?7.??Enhertu started 07/31/2021 every 3 weeks  ?----------------------------------------------------------------------------------------------------------------- ?05/11/2021: CT angiogram: Metastatic breast cancer soft tissue breast masses greater on the right, axillary supraclavicular lymphadenopathy, multiple bone metastases? ?? ?Hospitalization 08/25/2021-09/05/2021: Worsening right chest wall pain and infection?(patient has appointments with palliative care as well as infectious disease) ?? ?Current treatment: Enhertu started?10/17/2021, today cycle 6 ?Toxicities:?Tolerating it extremely well without any major problems. ?CT CAP 01/30/2022: Regression of the large Rt breast mass and Axill LN, sclerotic bone mets, New lesion in liver 1.5 cm ?Based on remarkable clinical and radiologic improvement, we decided to continue with current  treatment ? ?RTC every 3 weeks for Enhertu ?

## 2022-01-31 NOTE — Patient Instructions (Signed)
[facility]ICAL [facility]COL[facility]  Discharge Instructions: Thank you for choosing [facility] to provide your oncology and hematology care.   If you have a lab appointment with the Cancer Center, please go directly to the Cancer Center and check in at the registration area.   Wear comfortable clothing and clothing appropriate for easy access to any Portacath or PICC line.   We strive to give you quality time with your provider. You may need to reschedule your appointment if you arrive late (15 or more minutes).  Arriving late affects you and other patients whose appointments are after yours.  Also, if you miss three or more appointments without notifying the office, you may be dismissed from the clinic at the provider's discretion.      For prescription refill requests, have your pharmacy contact our office and allow 72 hours for refills to be completed.    Today you received the following chemotherapy and/or immunotherapy agent: Enhertu      To help prevent nausea and vomiting after your treatment, we encourage you to take your nausea medication as directed.  BELOW ARE SYMPTOMS THAT SHOULD BE REPORTED IMMEDIATELY: *FEVER GREATER THAN 100.4 F (38 C) OR HIGHER *CHILLS OR SWEATING *NAUSEA AND VOMITING THAT IS NOT CONTROLLED WITH YOUR NAUSEA MEDICATION *UNUSUAL SHORTNESS OF BREATH *UNUSUAL BRUISING OR BLEEDING *URINARY PROBLEMS (pain or burning when urinating, or frequent urination) *BOWEL PROBLEMS (unusual diarrhea, constipation, pain near the anus) TENDERNESS IN MOUTH AND THROAT WITH OR WITHOUT PRESENCE OF ULCERS (sore throat, sores in mouth, or a toothache) UNUSUAL RASH, SWELLING OR PAIN  UNUSUAL VAGINAL DISCHARGE OR ITCHING   Items with * indicate a potential emergency and should be followed up as soon as possible or go to the Emergency Department if any problems should occur.  Please show the CHEMOTHERAPY ALERT CARD or IMMUNOTHERAPY ALERT CARD at check-in to the  Emergency Department and triage nurse.  Should you have questions after your visit or need to cancel or reschedule your appointment, please contact [facility]  Dept: [phone]  and follow the prompts.  Office hours are 8:00 a.m. to 4:30 p.m. Monday - Friday. Please note that voicemails left after 4:00 p.m. may not be returned until the following business day.  We are closed weekends and major holidays. You have access to a nurse at all times for urgent questions. Please call the main number to the clinic Dept: [phone] and follow the prompts.   For any non-urgent questions, you may also contact your provider using MyChart. We now offer e-Visits for anyone 18 and older to request care online for non-urgent symptoms. For details visit [email or web address].   Also download the MyChart app! Go to the app store, search "MyChart", open the app, select [facility] [place], and log in with your MyChart username and password.  Due to Covid, a mask is required upon entering the hospital/clinic. If you do not have a mask, one will be given to you upon arrival. For doctor visits, patients may have 1 support person aged 18 or older with them. For treatment visits, patients cannot have anyone with them due to current Covid guidelines and our immunocompromised population.  

## 2022-01-31 NOTE — Progress Notes (Signed)
Per MD okay to treat with ANC 1.4 as well as BP 190/119.  Verbal orders received by MD for pt to receive Clonidine 0.1 mg p.o x1 today.  Pt educated to take BP 3 times a day at home and to f/u with PCP if BP remains high.  ?

## 2022-02-01 DIAGNOSIS — Z791 Long term (current) use of non-steroidal anti-inflammatories (NSAID): Secondary | ICD-10-CM | POA: Diagnosis not present

## 2022-02-01 DIAGNOSIS — G8929 Other chronic pain: Secondary | ICD-10-CM | POA: Diagnosis not present

## 2022-02-01 DIAGNOSIS — I10 Essential (primary) hypertension: Secondary | ICD-10-CM | POA: Diagnosis not present

## 2022-02-01 DIAGNOSIS — C7951 Secondary malignant neoplasm of bone: Secondary | ICD-10-CM | POA: Diagnosis not present

## 2022-02-01 DIAGNOSIS — G8220 Paraplegia, unspecified: Secondary | ICD-10-CM | POA: Diagnosis not present

## 2022-02-01 DIAGNOSIS — C50411 Malignant neoplasm of upper-outer quadrant of right female breast: Secondary | ICD-10-CM | POA: Diagnosis not present

## 2022-02-02 ENCOUNTER — Other Ambulatory Visit: Payer: Self-pay | Admitting: Hematology and Oncology

## 2022-02-04 ENCOUNTER — Encounter: Payer: Self-pay | Admitting: Hematology and Oncology

## 2022-02-08 NOTE — Progress Notes (Signed)
? ?Patient Care Team: ?Nicholas Lose, MD as PCP - General (Hematology and Oncology) ? ?DIAGNOSIS:  ?Encounter Diagnosis  ?Name Primary?  ? Malignant neoplasm of upper-outer quadrant of right breast in female, estrogen receptor positive ([place])   ? ? ?SUMMARY OF ONCOLOGIC HISTORY: ?Oncology History  ?Breast cancer of upper-outer quadrant of right female breast ([place])  ?09/21/2010 Mammogram  ? right breast linear and segmental pleomorphic calcifications from 4:00 to 6:00 position ultrasound revealed 1.6 cm and 1.1 cm masses ? ?  ?09/27/2010 Initial Biopsy  ? ultrasound-guided biopsy of all masses showed DCIS grade 2; patient went to cancer treatment centers of [place] for second opinion and delayed therapy ? ?  ?01/26/2011 Surgery  ? right mastectomy followed by reconstruction: Invasive ductal carcinoma T1 N1 MIC M0 stage IB ER/PR positive HER-2 negative, BRCA negative, Oncotype DX low risk ? ?  ?05/29/2012 Procedure  ? right chest wall nodule excision done on 06/24/2012 showed metastatic carcinoma margins were positive, but CT scan no metastatic disease, recommended chemotherapy but patient refused also refused reexcision ? ?  ?04/28/2013 Treatment Plan Change  ? patient went to [place] for alternative treatments and took herbal medications, tonics etc. But she could not afford these trips. ? ?  ?01/08/2014 Breast MRI  ? right breast multiple enhancing masses within the soft tissues largest 5.6 cm in wall skin surface and the capsule of the silicone prosthesis, contiguous nodules involving in inferomedial breast and tired and subcentimeter nodules across the midline ? ?  ?08/29/2015 Surgery  ? Right mastectomy: IDC with invol of skin and skin ulceration, breast capsule inv cancer, superior medial margin positive, 1/1 LN positive, grade 3, 14.3 cm, 3.5 cm, ALI, chest wall involv, ER 90-100%, PR 80-90%, HER-2 negative, Ki 67 40-50% T4CN1 (St 3B) ? ?  ?03/05/2016 -  Anti-estrogen oral therapy  ? Tamoxifen 20 mg daily stopped due  to headache and uncontrolled hypertension. Decrease to 10 mg daily 04/03/2016, stopped June 2017 and took an estrogen metabolizer over-the-counter; started Aromasin April 2018 from a [place] physician, Zoladex started on 05/2017 and switched to Letrozole in 09/2017 ?  ?01/04/2020 Surgery  ? Patient presented to the ED on 01/04/20 for worsening lower extremity numbness and underwent a decompressive laminectomy with tumor resection at T9 that was complicated with acute blood loss anemia and leukocytosis. ?  ?01/28/2020 - 02/15/2020 Radiation Therapy  ? Palliative radiation at site of thoracic tumor resection ?  ?03/03/2020 Miscellaneous  ?  Ibrance with letrozole and Zoladex ?  ? Miscellaneous  ? Guardant 360: ESR 1 mutations, PI K3 CA mutation, EGFR mutation, T p53 mutation ?  ?10/17/2021 -  Chemotherapy  ? Patient is on Treatment Plan : BREAST METASTATIC fam-trastuzumab deruxtecan-nxki (Enhertu) q21d  ? ?  ?  ?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[place])  ?01/04/2020 Initial Diagnosis  ? Metastasis of neoplasm to spinal canal [facility]) ? ?  ?10/17/2021 -  Chemotherapy  ? Patient is on Treatment Plan : BREAST METASTATIC fam-trastuzumab deruxtecan-nxki (Enhertu) q21d  ? ?  ?  ?Primary malignant neoplasm of breast with metastasis ([place])  ?01/08/2020 Initial Diagnosis  ? Metastatic breast cancer ([place]) ? ?  ?10/17/2021 -  Chemotherapy  ? Patient is on Treatment Plan : BREAST METASTATIC fam-trastuzumab deruxtecan-nxki (Enhertu) q21d  ? ?  ?  ? ? ?CHIEF COMPLIANT: Cycle 7 Enhertu   ? ?INTERVAL HISTORY: Misty Arnold is a  51 y.o. with above-mentioned history of metastatic breast cancer currently on chemotherapy with Enhertu. She presents to  the clinic today for follow-up and treatment. She is tolerating the Enhertu. She denies any side effects. She denies numbness in finger and toes.  ? ? ?ALLERGIES:  is allergic to amoxicillin, penicillins, pork-derived products, penicillin g, and tamoxifen. ? ?MEDICATIONS:  ?Current Outpatient  Medications  ?Medication Sig Dispense Refill  ? acetaminophen (TYLENOL) 500 MG tablet Take 2 tablets (1,000 mg total) by mouth every 6 (six) hours as needed for fever.    ? Ascorbic Acid (VITAMIN C PO) Take 1,000 mg by mouth daily.    ? atenolol (TENORMIN) 50 MG tablet Take 1 tablet (50 mg total) by mouth 2 (two) times daily. 60 tablet 3  ? celecoxib (CELEBREX) 200 MG capsule Take 1 capsule (200 mg total) by mouth at bedtime. 30 capsule 3  ? Cholecalciferol (VITAMIN D3) 125 MCG (5000 UT) CAPS Take 1 capsule (5,000 Units total) by mouth daily. 30 capsule 3  ? diazepam (VALIUM) 5 MG tablet Take 1 tablet (5 mg total) by mouth every 12 (twelve) hours as needed for anxiety or muscle spasms (sleep). 60 tablet 0  ? enoxaparin (LOVENOX) 40 MG/0.4ML injection Inject 0.4 mLs (40 mg total) into the skin daily. 24 mL 0  ? lip balm (CARMEX) ointment Apply 1 application topically as needed for lip care (cold sores). 7 g 0  ? megestrol (MEGACE ES) 625 MG/5ML suspension Take 5 mLs (625 mg total) by mouth daily. 150 mL 3  ? metroNIDAZOLE (METROGEL) 0.75 % gel Apply topically 2 (two) times daily. 45 g 0  ? morphine (MSIR) 30 MG tablet Take 1 tablet (30 mg total) by mouth every 4 (four) hours as needed for severe pain. 120 tablet 0  ? Multiple Vitamins-Minerals (MULTIVITAMIN WITH MINERALS) tablet FLORADIX Supplement-Iron +minerals    ? Nutritional Supplements (,FEEDING SUPPLEMENT, PROSOURCE PLUS) liquid Take 30 mLs by mouth 4 (four) times daily - after meals and at bedtime.    ? ondansetron (ZOFRAN) 8 MG tablet Take by mouth.    ? oxyCODONE ER (XTAMPZA ER) 9 MG C12A Take 1 capsule by mouth 2 times daily. 60 capsule 0  ? polyethylene glycol (MIRALAX / GLYCOLAX) 17 g packet Take 17 g by mouth daily. 30 each 0  ? potassium chloride SA (KLOR-CON M) 20 MEQ tablet Take 1 tablet (20 mEq total) by mouth daily. 30 tablet 0  ? pregabalin (LYRICA) 50 MG capsule Take 1 capsule (50 mg total) by mouth at bedtime. 30 capsule 6  ? Silver Hydrogel GEL  Apply to wound bed as directed with each dressing change. 44.4 mL 0  ? ?No current facility-administered medications for this visit.  ? ? ?PHYSICAL EXAMINATION: ?ECOG PERFORMANCE STATUS: 1 - Symptomatic but completely ambulatory ? ?Vitals:  ? 02/20/22 0837  ?BP: (!) 173/87  ?Pulse: 87  ?Resp: 17  ?Temp: (!) 97.3 ?F (36.3 ?C)  ?SpO2: 98%  ? ?Filed Weights  ? 02/20/22 0837  ?Weight: 134 lb 8 oz (61 kg)  ? ?  ? ?LABORATORY DATA:  ?I have reviewed the data as listed ? ?  Latest Ref Rng & Units 01/31/2022  ?  8:22 AM 01/09/2022  ?  9:11 AM 12/19/2021  ?  9:53 AM  ?CMP  ?Glucose 70 - 99 mg/dL 94   82   111    ?BUN 6 - 20 mg/dL _0 ?Creatinine 0.44 - 1.00 mg/dL 0.63   0.69   0.74    ?Sodium 135 - 145 mmol/L 141  141   142    ?Potassium 3.5 - 5.1 mmol/L 3.7   3.7   3.9    ?Chloride 98 - 111 mmol/L 107   106   110    ?CO2 22 - 32 mmol/L _0 ?Calcium 8.9 - 10.3 mg/dL 9.8   9.8   9.1    ?Total Protein 6.5 - 8.1 g/dL 7.4   6.9   6.5    ?Total Bilirubin 0.3 - 1.2 mg/dL 0.4   0.3   0.3    ?Alkaline Phos 38 - 126 U/L 163   151   138    ?AST 15 - 41 U/L 30   42   39    ?ALT 0 - 44 U/L 49   60   40    ? ? ?Lab Results  ?Component Value Date  ? WBC 2.7 (L) 02/20/2022  ? HGB 15.2 (H) 02/20/2022  ? HCT 44.9 02/20/2022  ? MCV 90.3 02/20/2022  ? PLT 295 02/20/2022  ? NEUTROABS 1.5 (L) 02/20/2022  ? ? ?ASSESSMENT & PLAN:  ?Breast cancer of upper-outer quadrant of right female breast ([place]) ?Recurrent right breast cancer initially DCIS treated with mastectomy followed by reconstruction and later developed chest wall recurrence in 2013 ER/PR positive HER-2 negative, patient underwent alternative therapies in [place] but could not afford the trips and hence she has not been on any breast cancer therapy for a long time. ?  ?Treatment summary: ?1. Right mastectomy 08/29/2015 at [place] (Dr.Singh): IDC with invol of skin and skin ulceration, breast capsule inv cancer, superior medial margin positive, 1/1 LN positive, grade 3,  14.3 cm, 3.5 cm, ALI, chest wall involv, ER 90-100%, PR 80-90%, HER-2 negative, Ki 67 40-50% T4CN1 (St 3B) ?2. Patient started tamoxifen but developed severe headache with severe hypertension and tamoxifen wa

## 2022-02-09 ENCOUNTER — Encounter: Payer: Self-pay | Admitting: Hematology and Oncology

## 2022-02-19 DIAGNOSIS — Z95828 Presence of other vascular implants and grafts: Secondary | ICD-10-CM | POA: Insufficient documentation

## 2022-02-20 ENCOUNTER — Other Ambulatory Visit: Payer: Self-pay | Admitting: *Deleted

## 2022-02-20 ENCOUNTER — Inpatient Hospital Stay: Payer: Medicare Other

## 2022-02-20 ENCOUNTER — Other Ambulatory Visit: Payer: Self-pay

## 2022-02-20 ENCOUNTER — Inpatient Hospital Stay (HOSPITAL_BASED_OUTPATIENT_CLINIC_OR_DEPARTMENT_OTHER): Payer: Medicare Other | Admitting: Hematology and Oncology

## 2022-02-20 ENCOUNTER — Encounter: Payer: Self-pay | Admitting: Hematology and Oncology

## 2022-02-20 ENCOUNTER — Other Ambulatory Visit (HOSPITAL_COMMUNITY): Payer: Self-pay

## 2022-02-20 ENCOUNTER — Inpatient Hospital Stay: Payer: Medicare Other | Attending: Hematology and Oncology

## 2022-02-20 DIAGNOSIS — Z9221 Personal history of antineoplastic chemotherapy: Secondary | ICD-10-CM | POA: Insufficient documentation

## 2022-02-20 DIAGNOSIS — C7949 Secondary malignant neoplasm of other parts of nervous system: Secondary | ICD-10-CM

## 2022-02-20 DIAGNOSIS — Z79899 Other long term (current) drug therapy: Secondary | ICD-10-CM | POA: Diagnosis not present

## 2022-02-20 DIAGNOSIS — C50411 Malignant neoplasm of upper-outer quadrant of right female breast: Secondary | ICD-10-CM | POA: Insufficient documentation

## 2022-02-20 DIAGNOSIS — Z170 Estrogen receptor positive status [ER+]: Secondary | ICD-10-CM

## 2022-02-20 DIAGNOSIS — C7951 Secondary malignant neoplasm of bone: Secondary | ICD-10-CM | POA: Insufficient documentation

## 2022-02-20 DIAGNOSIS — Z5181 Encounter for therapeutic drug level monitoring: Secondary | ICD-10-CM

## 2022-02-20 DIAGNOSIS — Z9011 Acquired absence of right breast and nipple: Secondary | ICD-10-CM | POA: Insufficient documentation

## 2022-02-20 DIAGNOSIS — Z923 Personal history of irradiation: Secondary | ICD-10-CM | POA: Insufficient documentation

## 2022-02-20 DIAGNOSIS — Z5112 Encounter for antineoplastic immunotherapy: Secondary | ICD-10-CM | POA: Insufficient documentation

## 2022-02-20 DIAGNOSIS — C50919 Malignant neoplasm of unspecified site of unspecified female breast: Secondary | ICD-10-CM

## 2022-02-20 LAB — CMP (CANCER CENTER ONLY)
ALT: 24 U/L (ref 0–44)
AST: 25 U/L (ref 15–41)
Albumin: 4.3 g/dL (ref 3.5–5.0)
Alkaline Phosphatase: 158 U/L — ABNORMAL HIGH (ref 38–126)
Anion gap: 9 (ref 5–15)
BUN: 11 mg/dL (ref 6–20)
CO2: 27 mmol/L (ref 22–32)
Calcium: 10.1 mg/dL (ref 8.9–10.3)
Chloride: 106 mmol/L (ref 98–111)
Creatinine: 0.74 mg/dL (ref 0.44–1.00)
GFR, Estimated: >60 mL/min (ref >60)
Glucose, Bld: 98 mg/dL (ref 70–99)
Potassium: 3.5 mmol/L (ref 3.5–5.1)
Sodium: 142 mmol/L (ref 135–145)
Total Bilirubin: 0.5 mg/dL (ref 0.3–1.2)
Total Protein: 7.6 g/dL (ref 6.5–8.1)

## 2022-02-20 LAB — CBC WITH DIFFERENTIAL (CANCER CENTER ONLY)
Abs Immature Granulocytes: 0.02 10*3/uL (ref 0.00–0.07)
Basophils Absolute: 0 10*3/uL (ref 0.0–0.1)
Basophils Relative: 1 %
Eosinophils Absolute: 0.1 10*3/uL (ref 0.0–0.5)
Eosinophils Relative: 2 %
HCT: 44.9 % (ref 36.0–46.0)
Hemoglobin: 15.2 g/dL — ABNORMAL HIGH (ref 12.0–15.0)
Immature Granulocytes: 1 %
Lymphocytes Relative: 30 %
Lymphs Abs: 0.8 10*3/uL (ref 0.7–4.0)
MCH: 30.6 pg (ref 26.0–34.0)
MCHC: 33.9 g/dL (ref 30.0–36.0)
MCV: 90.3 fL (ref 80.0–100.0)
Monocytes Absolute: 0.3 10*3/uL (ref 0.1–1.0)
Monocytes Relative: 10 %
Neutro Abs: 1.5 10*3/uL — ABNORMAL LOW (ref 1.7–7.7)
Neutrophils Relative %: 56 %
Platelet Count: 295 10*3/uL (ref 150–400)
RBC: 4.97 MIL/uL (ref 3.87–5.11)
RDW: 14.6 % (ref 11.5–15.5)
WBC Count: 2.7 10*3/uL — ABNORMAL LOW (ref 4.0–10.5)
nRBC: 0 % (ref 0.0–0.2)

## 2022-02-20 MED ORDER — SODIUM CHLORIDE 0.9 % IV SOLN
10.0000 mg | Freq: Once | INTRAVENOUS | Status: AC
  Administered 2022-02-20: 10 mg via INTRAVENOUS
  Filled 2022-02-20: qty 10

## 2022-02-20 MED ORDER — FAM-TRASTUZUMAB DERUXTECAN-NXKI CHEMO 100 MG IV SOLR
5.4900 mg/kg | Freq: Once | INTRAVENOUS | Status: AC
  Administered 2022-02-20: 300 mg via INTRAVENOUS
  Filled 2022-02-20: qty 15

## 2022-02-20 MED ORDER — DIPHENHYDRAMINE HCL 25 MG PO CAPS
25.0000 mg | ORAL_CAPSULE | Freq: Once | ORAL | Status: AC
  Administered 2022-02-20: 25 mg via ORAL
  Filled 2022-02-20: qty 1

## 2022-02-20 MED ORDER — CELECOXIB 200 MG PO CAPS
200.0000 mg | ORAL_CAPSULE | Freq: Every day | ORAL | 3 refills | Status: DC
  Filled 2022-02-20: qty 30, 30d supply, fill #0
  Filled 2022-06-06 – 2022-06-19 (×2): qty 30, 30d supply, fill #1

## 2022-02-20 MED ORDER — PALONOSETRON HCL INJECTION 0.25 MG/5ML
0.2500 mg | Freq: Once | INTRAVENOUS | Status: AC
  Administered 2022-02-20: 0.25 mg via INTRAVENOUS
  Filled 2022-02-20: qty 5

## 2022-02-20 MED ORDER — ACETAMINOPHEN 325 MG PO TABS
650.0000 mg | ORAL_TABLET | Freq: Once | ORAL | Status: AC
  Administered 2022-02-20: 650 mg via ORAL
  Filled 2022-02-20: qty 2

## 2022-02-20 MED ORDER — DEXTROSE 5 % IV SOLN: Freq: Once | INTRAVENOUS | Status: AC

## 2022-02-20 NOTE — Patient Instructions (Signed)
[facility]  Discharge Instructions: Thank you for choosing [facility] to provide your oncology and hematology care.   If you have a lab appointment with the Cancer Center, please go directly to the Cancer Center and check in at the registration area.   Wear comfortable clothing and clothing appropriate for easy access to any Portacath or PICC line.   We strive to give you quality time with your provider. You may need to reschedule your appointment if you arrive late (15 or more minutes).  Arriving late affects you and other patients whose appointments are after yours.  Also, if you miss three or more appointments without notifying the office, you may be dismissed from the clinic at the provider's discretion.      For prescription refill requests, have your pharmacy contact our office and allow 72 hours for refills to be completed.    Today you received the following chemotherapy and/or immunotherapy agent: Enhertu      To help prevent nausea and vomiting after your treatment, we encourage you to take your nausea medication as directed.  BELOW ARE SYMPTOMS THAT SHOULD BE REPORTED IMMEDIATELY: *FEVER GREATER THAN 100.4 F (38 C) OR HIGHER *CHILLS OR SWEATING *NAUSEA AND VOMITING THAT IS NOT CONTROLLED WITH YOUR NAUSEA MEDICATION *UNUSUAL SHORTNESS OF BREATH *UNUSUAL BRUISING OR BLEEDING *URINARY PROBLEMS (pain or burning when urinating, or frequent urination) *BOWEL PROBLEMS (unusual diarrhea, constipation, pain near the anus) TENDERNESS IN MOUTH AND THROAT WITH OR WITHOUT PRESENCE OF ULCERS (sore throat, sores in mouth, or a toothache) UNUSUAL RASH, SWELLING OR PAIN  UNUSUAL VAGINAL DISCHARGE OR ITCHING   Items with * indicate a potential emergency and should be followed up as soon as possible or go to the Emergency Department if any problems should occur.  Please show the CHEMOTHERAPY ALERT CARD or IMMUNOTHERAPY ALERT CARD at check-in to the  Emergency Department and triage nurse.  Should you have questions after your visit or need to cancel or reschedule your appointment, please contact [facility]  Dept: [phone]  and follow the prompts.  Office hours are 8:00 a.m. to 4:30 p.m. Monday - Friday. Please note that voicemails left after 4:00 p.m. may not be returned until the following business day.  We are closed weekends and major holidays. You have access to a nurse at all times for urgent questions. Please call the main number to the clinic Dept: [phone] and follow the prompts.   For any non-urgent questions, you may also contact your provider using MyChart. We now offer e-Visits for anyone 18 and older to request care online for non-urgent symptoms. For details visit [email or web address].   Also download the MyChart app! Go to the app store, search "MyChart", open the app, select [facility], and log in with your MyChart username and password.  Due to Covid, a mask is required upon entering the hospital/clinic. If you do not have a mask, one will be given to you upon arrival. For doctor visits, patients may have 1 support person aged 18 or older with them. For treatment visits, patients cannot have anyone with them due to current Covid guidelines and our immunocompromised population.  

## 2022-02-20 NOTE — Progress Notes (Signed)
Ok to utilize right arm for IV sticks and blood draws per Dr. Lindi Adie.  ?

## 2022-02-20 NOTE — Progress Notes (Signed)
Will continue enHertu dose of 300 mg and will follow weight changes and adjust doses prior to next treatment if needed.  ? ?Larene Beach, PharmD ? ?

## 2022-02-20 NOTE — Progress Notes (Signed)
Ok to treat with echo from 11/16/21 per Dr. Lindi Adie.  ?

## 2022-02-20 NOTE — Assessment & Plan Note (Signed)
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 ?? ?Treatment summary: ?1.?Right mastectomy 08/29/2015 at [facility] (Dr.Singh): IDC with invol of skin and skin ulceration, breast capsule inv cancer, superior medial margin positive, 1/1 LN positive, grade 3, 14.3 cm, 3.5 cm, ALI, chest wall involv, ER 90-100%, PR 80-90%, HER-2 negative, Ki 67 40-50% T4CN1 (St 3B) ?2.?Patient started tamoxifen but developed severe headache with severe hypertension and tamoxifen was discontinued 03/17/2016.? ?3.?02/2017 started on Exemestane, Zoladex started 05/29/2017 ?4.??Hospitalization for paraplegia: T9 vertebral body compression status post decompressive laminectomy and tumor resection 01/04/2020 ?5.??Palliative radiation to the thoracic spine 01/28/2020 ?6.??Ibrance with letrozole started April 2021?discontinued 07/24/2021 ?7.??Enhertu started 07/31/2021 every 3 weeks  ?----------------------------------------------------------------------------------------------------------------- ?05/11/2021: CT angiogram: Metastatic breast cancer soft tissue breast masses greater on the right, axillary supraclavicular lymphadenopathy, multiple bone metastases? ?? ?Hospitalization 08/25/2021-09/05/2021: Worsening right chest wall pain and infection?(patient has appointments with palliative care as well as infectious disease) ?? ?Current treatment: Enhertu started?10/17/2021, today cycle?7 ?Toxicities:?Tolerating it extremely well without any major problems. ?CT CAP 01/30/2022: Regression of the large Rt breast mass and Axill LN, sclerotic bone mets, New lesion in liver 1.5 cm ?Based on remarkable clinical and radiologic improvement, we decided to continue with current treatment ?

## 2022-02-27 ENCOUNTER — Ambulatory Visit (HOSPITAL_COMMUNITY)
Admission: RE | Admit: 2022-02-27 | Discharge: 2022-02-27 | Disposition: A | Discharge status: Home / Self Care | Payer: Medicare Other | Source: Ambulatory Visit | Attending: Hematology and Oncology | Admitting: Hematology and Oncology

## 2022-02-27 DIAGNOSIS — Z79899 Other long term (current) drug therapy: Secondary | ICD-10-CM | POA: Insufficient documentation

## 2022-02-27 DIAGNOSIS — Z5181 Encounter for therapeutic drug level monitoring: Secondary | ICD-10-CM | POA: Insufficient documentation

## 2022-02-27 NOTE — Progress Notes (Signed)
Echocardiogram ?2D Echocardiogram has been performed. ? ?Misty Arnold ?02/27/2022, 8:51 AM ?

## 2022-02-27 NOTE — Progress Notes (Signed)
? ?Patient Care Team: ?Misty Lose, MD as PCP - General (Hematology and Oncology) ? ?DIAGNOSIS:  ?Encounter Diagnosis  ?Name Primary?  ? Malignant neoplasm of upper-outer quadrant of right breast in female, estrogen receptor positive ([place])   ? ? ?SUMMARY OF ONCOLOGIC HISTORY: ?Oncology History  ?Breast cancer of upper-outer quadrant of right female breast ([place])  ?09/21/2010 Mammogram  ? right breast linear and segmental pleomorphic calcifications from 4:00 to 6:00 position ultrasound revealed 1.6 cm and 1.1 cm masses ? ?  ?09/27/2010 Initial Biopsy  ? ultrasound-guided biopsy of all masses showed DCIS grade 2; patient went to cancer treatment centers of [place] for second opinion and delayed therapy ? ?  ?01/26/2011 Surgery  ? right mastectomy followed by reconstruction: Invasive ductal carcinoma T1 N1 MIC M0 stage IB ER/PR positive HER-2 negative, BRCA negative, Oncotype DX low risk ? ?  ?05/29/2012 Procedure  ? right chest wall nodule excision done on 06/24/2012 showed metastatic carcinoma margins were positive, but CT scan no metastatic disease, recommended chemotherapy but patient refused also refused reexcision ? ?  ?04/28/2013 Treatment Plan Change  ? patient went to [place] for alternative treatments and took herbal medications, tonics etc. But she could not afford these trips. ? ?  ?01/08/2014 Breast MRI  ? right breast multiple enhancing masses within the soft tissues largest 5.6 cm in wall skin surface and the capsule of the silicone prosthesis, contiguous nodules involving in inferomedial breast and tired and subcentimeter nodules across the midline ? ?  ?08/29/2015 Surgery  ? Right mastectomy: IDC with invol of skin and skin ulceration, breast capsule inv cancer, superior medial margin positive, 1/1 LN positive, grade 3, 14.3 cm, 3.5 cm, ALI, chest wall involv, ER 90-100%, PR 80-90%, HER-2 negative, Ki 67 40-50% T4CN1 (St 3B) ? ?  ?03/05/2016 -  Anti-estrogen oral therapy  ? Tamoxifen 20 mg daily stopped due  to headache and uncontrolled hypertension. Decrease to 10 mg daily 04/03/2016, stopped June 2017 and took an estrogen metabolizer over-the-counter; started Aromasin April 2018 from a [place] physician, Zoladex started on 05/2017 and switched to Letrozole in 09/2017 ?  ?01/04/2020 Surgery  ? Patient presented to the ED on 01/04/20 for worsening lower extremity numbness and underwent a decompressive laminectomy with tumor resection at T9 that was complicated with acute blood loss anemia and leukocytosis. ?  ?01/28/2020 - 02/15/2020 Radiation Therapy  ? Palliative radiation at site of thoracic tumor resection ?  ?03/03/2020 Miscellaneous  ?  Ibrance with letrozole and Zoladex ?  ? Miscellaneous  ? Guardant 360: ESR 1 mutations, PI K3 CA mutation, EGFR mutation, T p53 mutation ?  ?10/17/2021 -  Chemotherapy  ? Patient is on Treatment Plan : BREAST METASTATIC fam-trastuzumab deruxtecan-nxki (Enhertu) q21d  ? ?  ?  ?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[place])  ?01/04/2020 Initial Diagnosis  ? Metastasis of neoplasm to spinal canal [facility]) ? ?  ?10/17/2021 -  Chemotherapy  ? Patient is on Treatment Plan : BREAST METASTATIC fam-trastuzumab deruxtecan-nxki (Enhertu) q21d  ? ?  ?  ?Primary malignant neoplasm of breast with metastasis ([place])  ?01/08/2020 Initial Diagnosis  ? Metastatic breast cancer ([place]) ? ?  ?10/17/2021 -  Chemotherapy  ? Patient is on Treatment Plan : BREAST METASTATIC fam-trastuzumab deruxtecan-nxki (Enhertu) q21d  ? ?  ?  ? ? ?CHIEF COMPLIANT: Cycle 8 Enhertu   ? ?INTERVAL HISTORY: Misty Arnold is a 51 y.o. with above-mentioned history of metastatic breast cancer currently on chemotherapy with Enhertu. She presents to the  clinic today for follow-up and treatment.  She reports no major side effects from treatment.  She has initially lost hair but the hair is coming back.  She is doing extensive detox at home and it she thinks that is the reason why she is tolerating the treatment so well. ? ?ALLERGIES:  is allergic  to amoxicillin, penicillins, pork-derived products, penicillin g, and tamoxifen. ? ?MEDICATIONS:  ?Current Outpatient Medications  ?Medication Sig Dispense Refill  ? acetaminophen (TYLENOL) 500 MG tablet Take 2 tablets (1,000 mg total) by mouth every 6 (six) hours as needed for fever.    ? Ascorbic Acid (VITAMIN C PO) Take 1,000 mg by mouth daily.    ? atenolol (TENORMIN) 50 MG tablet Take 1 tablet (50 mg total) by mouth 2 (two) times daily. 60 tablet 3  ? celecoxib (CELEBREX) 200 MG capsule Take 1 capsule (200 mg total) by mouth at bedtime. 30 capsule 3  ? Cholecalciferol (VITAMIN D3) 125 MCG (5000 UT) CAPS Take 1 capsule (5,000 Units total) by mouth daily. 30 capsule 3  ? diazepam (VALIUM) 5 MG tablet Take 1 tablet (5 mg total) by mouth every 12 (twelve) hours as needed for anxiety or muscle spasms (sleep). 60 tablet 0  ? enoxaparin (LOVENOX) 40 MG/0.4ML injection Inject 0.4 mLs (40 mg total) into the skin daily. 24 mL 0  ? lip balm (CARMEX) ointment Apply 1 application topically as needed for lip care (cold sores). 7 g 0  ? megestrol (MEGACE ES) 625 MG/5ML suspension Take 5 mLs (625 mg total) by mouth daily. 150 mL 3  ? metroNIDAZOLE (METROGEL) 0.75 % gel Apply topically 2 (two) times daily. 45 g 0  ? morphine (MSIR) 30 MG tablet Take 1 tablet (30 mg total) by mouth every 4 (four) hours as needed for severe pain. 120 tablet 0  ? Multiple Vitamins-Minerals (MULTIVITAMIN WITH MINERALS) tablet FLORADIX Supplement-Iron +minerals    ? Nutritional Supplements (,FEEDING SUPPLEMENT, PROSOURCE PLUS) liquid Take 30 mLs by mouth 4 (four) times daily - after meals and at bedtime.    ? ondansetron (ZOFRAN) 8 MG tablet Take by mouth.    ? oxyCODONE ER (XTAMPZA ER) 9 MG C12A Take 1 capsule by mouth 2 times daily. 60 capsule 0  ? polyethylene glycol (MIRALAX / GLYCOLAX) 17 g packet Take 17 g by mouth daily. 30 each 0  ? potassium chloride SA (KLOR-CON M) 20 MEQ tablet Take 1 tablet (20 mEq total) by mouth daily. 30 tablet 0  ?  pregabalin (LYRICA) 50 MG capsule Take 1 capsule (50 mg total) by mouth at bedtime. 30 capsule 6  ? Silver Hydrogel GEL Apply to wound bed as directed with each dressing change. 44.4 mL 0  ? ?No current facility-administered medications for this visit.  ? ? ?PHYSICAL EXAMINATION: ?ECOG PERFORMANCE STATUS: 1 - Symptomatic but completely ambulatory ? ?There were no vitals filed for this visit. ?There were no vitals filed for this visit. ?  ? ?LABORATORY DATA:  ?I have reviewed the data as listed ? ?  Latest Ref Rng & Units 02/20/2022  ?  8:27 AM 01/31/2022  ?  8:22 AM 01/09/2022  ?  9:11 AM  ?CMP  ?Glucose 70 - 99 mg/dL 98   94   82    ?BUN 6 - 20 mg/dL 11   9   22     ?Creatinine 0.44 - 1.00 mg/dL 0.74   0.63   0.69    ?Sodium 135 - 145 mmol/L 142  141   141    ?Potassium 3.5 - 5.1 mmol/L 3.5   3.7   3.7    ?Chloride 98 - 111 mmol/L 106   107   106    ?CO2 22 - 32 mmol/L 27   26   30     ?Calcium 8.9 - 10.3 mg/dL 10.1   9.8   9.8    ?Total Protein 6.5 - 8.1 g/dL 7.6   7.4   6.9    ?Total Bilirubin 0.3 - 1.2 mg/dL 0.5   0.4   0.3    ?Alkaline Phos 38 - 126 U/L 158   163   151    ?AST 15 - 41 U/L 25   30   42    ?ALT 0 - 44 U/L 24   49   60    ? ? ?Lab Results  ?Component Value Date  ? WBC 3.0 (L) 03/13/2022  ? HGB 14.5 03/13/2022  ? HCT 43.5 03/13/2022  ? MCV 92.8 03/13/2022  ? PLT 262 03/13/2022  ? NEUTROABS 1.8 03/13/2022  ? ? ?ASSESSMENT & PLAN:  ?Breast cancer of upper-outer quadrant of right female breast ([place]) ?Recurrent right breast cancer initially DCIS treated with mastectomy followed by reconstruction and later developed chest wall recurrence in 2013 ER/PR positive HER-2 negative, patient underwent alternative therapies in [place] but could not afford the trips and hence she has not been on any breast cancer therapy for a long time. ?  ?Treatment summary: ?1. Right mastectomy 08/29/2015 at [facility] (Dr.Singh): IDC with invol of skin and skin ulceration, breast capsule inv cancer, superior medial margin positive, 1/1  LN positive, grade 3, 14.3 cm, 3.5 cm, ALI, chest wall involv, ER 90-100%, PR 80-90%, HER-2 negative, Ki 67 40-50% T4CN1 (St 3B) ?2. Patient started tamoxifen but developed severe headache with severe

## 2022-02-28 ENCOUNTER — Inpatient Hospital Stay (HOSPITAL_BASED_OUTPATIENT_CLINIC_OR_DEPARTMENT_OTHER): Payer: Medicare Other | Admitting: Nurse Practitioner

## 2022-02-28 ENCOUNTER — Encounter: Payer: Self-pay | Admitting: Nurse Practitioner

## 2022-02-28 DIAGNOSIS — C50411 Malignant neoplasm of upper-outer quadrant of right female breast: Secondary | ICD-10-CM | POA: Diagnosis not present

## 2022-02-28 DIAGNOSIS — T148XXA Other injury of unspecified body region, initial encounter: Secondary | ICD-10-CM

## 2022-02-28 DIAGNOSIS — R630 Anorexia: Secondary | ICD-10-CM | POA: Diagnosis not present

## 2022-02-28 DIAGNOSIS — Z170 Estrogen receptor positive status [ER+]: Secondary | ICD-10-CM

## 2022-02-28 DIAGNOSIS — C801 Malignant (primary) neoplasm, unspecified: Secondary | ICD-10-CM

## 2022-02-28 DIAGNOSIS — Z515 Encounter for palliative care: Secondary | ICD-10-CM

## 2022-02-28 DIAGNOSIS — Z5112 Encounter for antineoplastic immunotherapy: Secondary | ICD-10-CM | POA: Diagnosis not present

## 2022-02-28 NOTE — Progress Notes (Signed)
? ?  ?Palliative Medicine ?[place]  ?[phone]) [phone] [phone] ? ? ?Name: Misty Arnold ?Date: 02/28/2022 ?MRN: [id number]  ?DOB: 11/30/1970 ? ?Patient Care Team: ?Nicholas Lose, MD as PCP - General (Hematology and Oncology)  ? ?I connected with [name] on 02/28/22 at 11:00 AM EDT by phone and verified that I am speaking with the correct person using two identifiers.  ? ?I discussed the limitations, risks, security and privacy concerns of performing an evaluation and management service by telemedicine and the availability of in-person appointments. I also discussed with the patient that there may be a patient responsible charge related to this service. The patient expressed understanding and agreed to proceed.  ? ?Other persons participating in the visit and their role in the encounter: N/A  ? ?Patient?s location: Home    ?Provider?s location: [facility]   ? ?INTERVAL HISTORY: ?Misty Arnold is a 51 y.o. female with right metastatic breast cancer s/p mastectomy, large right chest wound secondary to necrotic mass, weankess due to T9 vertebral compression s/p tumor resection and decompressive laminectomy, and hypertension. Patient did not receive treatment for some time after seeking care in [place]. Currently receiving Enhertu treatment. Palliative following for symptom management and goals of care.  ? ?SOCIAL HISTORY:    ? reports that she has never smoked. She has never used smokeless tobacco. She reports that she does not currently use alcohol. She reports that she does not use drugs. ? ?ADVANCE DIRECTIVES:  ?Advance directives completed during recent hospitalization 08/27/2021.  Documents are on file and have been reviewed personally and [name]. ? ?CODE STATUS: Full code ? ?PAST MEDICAL HISTORY: ?Past Medical History:  ?Diagnosis Date  ? Abnormal uterine bleeding   ? Anemia   ? Breast cancer ([place])   ? Depression   ? Fibroid   ? Hormone disorder   ?  Hypertension   ? Infertility, female   ? ?ALLERGIES:  is allergic to amoxicillin, penicillins, pork-derived products, penicillin g, and tamoxifen. ? ?MEDICATIONS:  ?Current Outpatient Medications  ?Medication Sig Dispense Refill  ? acetaminophen (TYLENOL) 500 MG tablet Take 2 tablets (1,000 mg total) by mouth every 6 (six) hours as needed for fever.    ? Ascorbic Acid (VITAMIN C PO) Take 1,000 mg by mouth daily.    ? atenolol (TENORMIN) 50 MG tablet Take 1 tablet (50 mg total) by mouth 2 (two) times daily. 60 tablet 3  ? celecoxib (CELEBREX) 200 MG capsule Take 1 capsule (200 mg total) by mouth at bedtime. 30 capsule 3  ? Cholecalciferol (VITAMIN D3) 125 MCG (5000 UT) CAPS Take 1 capsule (5,000 Units total) by mouth daily. 30 capsule 3  ? diazepam (VALIUM) 5 MG tablet Take 1 tablet (5 mg total) by mouth every 12 (twelve) hours as needed for anxiety or muscle spasms (sleep). 60 tablet 0  ? enoxaparin (LOVENOX) 40 MG/0.4ML injection Inject 0.4 mLs (40 mg total) into the skin daily. 24 mL 0  ? lip balm (CARMEX) ointment Apply 1 application topically as needed for lip care (cold sores). 7 g 0  ? megestrol (MEGACE ES) 625 MG/5ML suspension Take 5 mLs (625 mg total) by mouth daily. 150 mL 3  ? metroNIDAZOLE (METROGEL) 0.75 % gel Apply topically 2 (two) times daily. 45 g 0  ? morphine (MSIR) 30 MG tablet Take 1 tablet (30 mg total) by mouth every 4 (four) hours as needed for severe pain. 120 tablet 0  ? Multiple Vitamins-Minerals (MULTIVITAMIN WITH MINERALS)  tablet FLORADIX Supplement-Iron +minerals    ? Nutritional Supplements (,FEEDING SUPPLEMENT, PROSOURCE PLUS) liquid Take 30 mLs by mouth 4 (four) times daily - after meals and at bedtime.    ? ondansetron (ZOFRAN) 8 MG tablet Take by mouth.    ? oxyCODONE ER (XTAMPZA ER) 9 MG C12A Take 1 capsule by mouth 2 times daily. 60 capsule 0  ? polyethylene glycol (MIRALAX / GLYCOLAX) 17 g packet Take 17 g by mouth daily. 30 each 0  ? potassium chloride SA (KLOR-CON M) 20 MEQ  tablet Take 1 tablet (20 mEq total) by mouth daily. 30 tablet 0  ? pregabalin (LYRICA) 50 MG capsule Take 1 capsule (50 mg total) by mouth at bedtime. 30 capsule 6  ? Silver Hydrogel GEL Apply to wound bed as directed with each dressing change. 44.4 mL 0  ? ?No current facility-administered medications for this visit.  ? ? ?VITAL SIGNS: ?There were no vitals taken for this visit. ?There were no vitals filed for this visit.  ?Estimated body mass index is 22.38 kg/m? as calculated from the following: ?  Height as of 02/20/22: '5\' 5"'$  (1.651 m). ?  Weight as of 02/20/22: 134 lb 8 oz (61 kg). ? ?PERFORMANCE STATUS (ECOG) : 0 - Asymptomatic ? ? ?IMPRESSION: ? ?I connected with Ms. Zenk today via phone for follow-up. Reports no acute distress. Denies pain or discomfort. Shares she continues to do well and is tolerating treatments.  ? ?Ms. Clarks pain is resolved. She is not having to take any form of pain medication other than occasional Tylenol use for minor aches. Appetite is much improved.  Is drinking boost protein drinks twice daily.  ? ?Denies constipation, diarrhea, nausea, or vomiting.  ? ? Patient knows to contact our office as needed. We will continue to follow and be available. I am glad she is much improved.  ? ? ?PLAN: ?No refills needed on today.  Pain is resolved. Occasional Tylenol use for minor aches and headaches. ?Appetite much improved. 1-2 boost daily. Patient supplied with case from our office. ?I will plan to see her back in 6 weeks. ? ?Patient expressed understanding and was in agreement with this plan. She also understands that She can call the clinic at any time with any questions, concerns, or complaints.  ? ?Time Total: 15 min ? ?Visit consisted of counseling and education dealing with the complex and emotionally intense issues of symptom management and palliative care in the setting of serious and potentially life-threatening illness.Greater than 50%  of this time was spent counseling and  coordinating care related to the above assessment and plan. ? ?[name], AGPCNP-BC  ?[place] ? ? ? ? ?

## 2022-03-01 ENCOUNTER — Other Ambulatory Visit (HOSPITAL_COMMUNITY): Payer: Medicare Other

## 2022-03-04 LAB — ECHOCARDIOGRAM COMPLETE
AR max vel: 2.29 cm2
AV Area VTI: 2.25 cm2
AV Area mean vel: 2.16 cm2
AV Mean grad: 4 mmHg
AV Peak grad: 7.4 mmHg
Ao pk vel: 1.36 m/s
Area-P 1/2: 3.74 cm2
S' Lateral: 3 cm

## 2022-03-13 ENCOUNTER — Other Ambulatory Visit: Payer: Self-pay

## 2022-03-13 ENCOUNTER — Inpatient Hospital Stay: Payer: Medicare Other

## 2022-03-13 ENCOUNTER — Inpatient Hospital Stay: Payer: Medicare Other | Attending: Hematology and Oncology | Admitting: Hematology and Oncology

## 2022-03-13 VITALS — BP 151/98 | HR 85 | Temp 98.9°F

## 2022-03-13 DIAGNOSIS — Z923 Personal history of irradiation: Secondary | ICD-10-CM | POA: Insufficient documentation

## 2022-03-13 DIAGNOSIS — Z5112 Encounter for antineoplastic immunotherapy: Secondary | ICD-10-CM | POA: Diagnosis present

## 2022-03-13 DIAGNOSIS — C50411 Malignant neoplasm of upper-outer quadrant of right female breast: Secondary | ICD-10-CM | POA: Diagnosis present

## 2022-03-13 DIAGNOSIS — Z170 Estrogen receptor positive status [ER+]: Secondary | ICD-10-CM

## 2022-03-13 DIAGNOSIS — C7949 Secondary malignant neoplasm of other parts of nervous system: Secondary | ICD-10-CM

## 2022-03-13 DIAGNOSIS — C50919 Malignant neoplasm of unspecified site of unspecified female breast: Secondary | ICD-10-CM

## 2022-03-13 DIAGNOSIS — Z79899 Other long term (current) drug therapy: Secondary | ICD-10-CM | POA: Diagnosis not present

## 2022-03-13 DIAGNOSIS — Z9011 Acquired absence of right breast and nipple: Secondary | ICD-10-CM | POA: Insufficient documentation

## 2022-03-13 LAB — CBC WITH DIFFERENTIAL (CANCER CENTER ONLY)
Abs Immature Granulocytes: 0.02 10*3/uL (ref 0.00–0.07)
Basophils Absolute: 0 10*3/uL (ref 0.0–0.1)
Basophils Relative: 1 %
Eosinophils Absolute: 0.1 10*3/uL (ref 0.0–0.5)
Eosinophils Relative: 3 %
HCT: 43.5 % (ref 36.0–46.0)
Hemoglobin: 14.5 g/dL (ref 12.0–15.0)
Immature Granulocytes: 1 %
Lymphocytes Relative: 24 %
Lymphs Abs: 0.7 10*3/uL (ref 0.7–4.0)
MCH: 30.9 pg (ref 26.0–34.0)
MCHC: 33.3 g/dL (ref 30.0–36.0)
MCV: 92.8 fL (ref 80.0–100.0)
Monocytes Absolute: 0.4 10*3/uL (ref 0.1–1.0)
Monocytes Relative: 13 %
Neutro Abs: 1.8 10*3/uL (ref 1.7–7.7)
Neutrophils Relative %: 58 %
Platelet Count: 262 10*3/uL (ref 150–400)
RBC: 4.69 MIL/uL (ref 3.87–5.11)
RDW: 14.8 % (ref 11.5–15.5)
WBC Count: 3 10*3/uL — ABNORMAL LOW (ref 4.0–10.5)
nRBC: 0 % (ref 0.0–0.2)

## 2022-03-13 LAB — CMP (CANCER CENTER ONLY)
ALT: 50 U/L — ABNORMAL HIGH (ref 0–44)
AST: 45 U/L — ABNORMAL HIGH (ref 15–41)
Albumin: 4.1 g/dL (ref 3.5–5.0)
Alkaline Phosphatase: 153 U/L — ABNORMAL HIGH (ref 38–126)
Anion gap: 10 (ref 5–15)
BUN: 16 mg/dL (ref 6–20)
CO2: 28 mmol/L (ref 22–32)
Calcium: 9.8 mg/dL (ref 8.9–10.3)
Chloride: 105 mmol/L (ref 98–111)
Creatinine: 0.81 mg/dL (ref 0.44–1.00)
GFR, Estimated: >60 mL/min (ref >60)
Glucose, Bld: 70 mg/dL (ref 70–99)
Potassium: 3.6 mmol/L (ref 3.5–5.1)
Sodium: 143 mmol/L (ref 135–145)
Total Bilirubin: 0.7 mg/dL (ref 0.3–1.2)
Total Protein: 7.4 g/dL (ref 6.5–8.1)

## 2022-03-13 MED ORDER — FAM-TRASTUZUMAB DERUXTECAN-NXKI CHEMO 100 MG IV SOLR
5.4900 mg/kg | Freq: Once | INTRAVENOUS | Status: AC
  Administered 2022-03-13: 300 mg via INTRAVENOUS
  Filled 2022-03-13: qty 15

## 2022-03-13 MED ORDER — SODIUM CHLORIDE 0.9 % IV SOLN
10.0000 mg | Freq: Once | INTRAVENOUS | Status: AC
  Administered 2022-03-13: 10 mg via INTRAVENOUS
  Filled 2022-03-13: qty 10

## 2022-03-13 MED ORDER — DIPHENHYDRAMINE HCL 25 MG PO CAPS
25.0000 mg | ORAL_CAPSULE | Freq: Once | ORAL | Status: AC
  Administered 2022-03-13: 25 mg via ORAL
  Filled 2022-03-13: qty 1

## 2022-03-13 MED ORDER — DEXTROSE 5 % IV SOLN: Freq: Once | INTRAVENOUS | Status: AC

## 2022-03-13 MED ORDER — ACETAMINOPHEN 325 MG PO TABS
650.0000 mg | ORAL_TABLET | Freq: Once | ORAL | Status: AC
  Administered 2022-03-13: 650 mg via ORAL
  Filled 2022-03-13: qty 2

## 2022-03-13 MED ORDER — PALONOSETRON HCL INJECTION 0.25 MG/5ML
0.2500 mg | Freq: Once | INTRAVENOUS | Status: AC
  Administered 2022-03-13: 0.25 mg via INTRAVENOUS
  Filled 2022-03-13: qty 5

## 2022-03-13 NOTE — Assessment & Plan Note (Signed)
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 ?? ?Treatment summary: ?1.?Right mastectomy 08/29/2015 at [place] (Dr.Singh): IDC with invol of skin and skin ulceration, breast capsule inv cancer, superior medial margin positive, 1/1 LN positive, grade 3, 14.3 cm, 3.5 cm, ALI, chest wall involv, ER 90-100%, PR 80-90%, HER-2 negative, Ki 67 40-50% T4CN1 (St 3B) ?2.?Patient started tamoxifen but developed severe headache with severe hypertension and tamoxifen was discontinued 03/17/2016.? ?3.?02/2017 started on Exemestane, Zoladex started 05/29/2017 ?4.??Hospitalization for paraplegia: T9 vertebral body compression status post decompressive laminectomy and tumor resection 01/04/2020 ?5.??Palliative radiation to the thoracic spine 01/28/2020 ?6.??Ibrance with letrozole started April 2021?discontinued 07/24/2021 ?7.??Enhertu started 07/31/2021 every 3 weeks  ?----------------------------------------------------------------------------------------------------------------- ?05/11/2021: CT angiogram: Metastatic breast cancer soft tissue breast masses greater on the right, axillary supraclavicular lymphadenopathy, multiple bone metastases? ?? ?Hospitalization 08/25/2021-09/05/2021: Worsening right chest wall pain and infection?(patient has appointments with palliative care as well as infectious disease) ?? ?Current treatment: Enhertu started?10/17/2021, today cycle?8 ?Toxicities:?Tolerating it extremely well without any major problems. ?CT CAP 01/30/2022: Regression of the large Rt breast mass and Axill LN, sclerotic bone mets, New lesion in liver 1.5 cm ?Chest wall tumors: Marked improvement ?? ?Based on remarkable clinical and radiologic improvement, we  decided to continue with current treatment ?Repeat CT scans end of June ?

## 2022-03-13 NOTE — Progress Notes (Signed)
enHERtu dose rounded to nearest vial size to reduce waste. Will continue current dose of 300 mg  ? ?Larene Beach, PharmD ? ?

## 2022-03-13 NOTE — Patient Instructions (Signed)
[facility]  Discharge Instructions: Thank you for choosing [facility] to provide your oncology and hematology care.   If you have a lab appointment with the Cancer Center, please go directly to the Cancer Center and check in at the registration area.   Wear comfortable clothing and clothing appropriate for easy access to any Portacath or PICC line.   We strive to give you quality time with your provider. You may need to reschedule your appointment if you arrive late (15 or more minutes).  Arriving late affects you and other patients whose appointments are after yours.  Also, if you miss three or more appointments without notifying the office, you may be dismissed from the clinic at the provider's discretion.      For prescription refill requests, have your pharmacy contact our office and allow 72 hours for refills to be completed.    Today you received the following chemotherapy and/or immunotherapy agent: Enhertu      To help prevent nausea and vomiting after your treatment, we encourage you to take your nausea medication as directed.  BELOW ARE SYMPTOMS THAT SHOULD BE REPORTED IMMEDIATELY: *FEVER GREATER THAN 100.4 F (38 C) OR HIGHER *CHILLS OR SWEATING *NAUSEA AND VOMITING THAT IS NOT CONTROLLED WITH YOUR NAUSEA MEDICATION *UNUSUAL SHORTNESS OF BREATH *UNUSUAL BRUISING OR BLEEDING *URINARY PROBLEMS (pain or burning when urinating, or frequent urination) *BOWEL PROBLEMS (unusual diarrhea, constipation, pain near the anus) TENDERNESS IN MOUTH AND THROAT WITH OR WITHOUT PRESENCE OF ULCERS (sore throat, sores in mouth, or a toothache) UNUSUAL RASH, SWELLING OR PAIN  UNUSUAL VAGINAL DISCHARGE OR ITCHING   Items with * indicate a potential emergency and should be followed up as soon as possible or go to the Emergency Department if any problems should occur.  Please show the CHEMOTHERAPY ALERT CARD or IMMUNOTHERAPY ALERT CARD at check-in to the  Emergency Department and triage nurse.  Should you have questions after your visit or need to cancel or reschedule your appointment, please contact [facility]  Dept: [phone]  and follow the prompts.  Office hours are 8:00 a.m. to 4:30 p.m. Monday - Friday. Please note that voicemails left after 4:00 p.m. may not be returned until the following business day.  We are closed weekends and major holidays. You have access to a nurse at all times for urgent questions. Please call the main number to the clinic Dept: [phone] and follow the prompts.   For any non-urgent questions, you may also contact your provider using MyChart. We now offer e-Visits for anyone 18 and older to request care online for non-urgent symptoms. For details visit [email or web address].   Also download the MyChart app! Go to the app store, search "MyChart", open the app, select [facility], and log in with your MyChart username and password.  Due to Covid, a mask is required upon entering the hospital/clinic. If you do not have a mask, one will be given to you upon arrival. For doctor visits, patients may have 1 support person aged 18 or older with them. For treatment visits, patients cannot have anyone with them due to current Covid guidelines and our immunocompromised population.  

## 2022-03-14 ENCOUNTER — Inpatient Hospital Stay: Payer: Medicare Other

## 2022-03-14 ENCOUNTER — Inpatient Hospital Stay: Payer: Medicare Other | Admitting: Hematology and Oncology

## 2022-03-26 NOTE — Progress Notes (Signed)
Patient Care Team: Nicholas Lose, MD as PCP - General (Hematology and Oncology)  DIAGNOSIS:  Encounter Diagnosis  Name Primary?   Malignant neoplasm of upper-outer quadrant of right breast in female, estrogen receptor positive ([place]) Yes    SUMMARY OF ONCOLOGIC HISTORY: Oncology History  Breast cancer of upper-outer quadrant of right female breast ([place])  09/21/2010 Mammogram   right breast linear and segmental pleomorphic calcifications from 4:00 to 6:00 position ultrasound revealed 1.6 cm and 1.1 cm masses    09/27/2010 Initial Biopsy   ultrasound-guided biopsy of all masses showed DCIS grade 2; patient went to cancer treatment centers of [place] for second opinion and delayed therapy    01/26/2011 Surgery   right mastectomy followed by reconstruction: Invasive ductal carcinoma T1 N1 MIC M0 stage IB ER/PR positive HER-2 negative, BRCA negative, Oncotype DX low risk    05/29/2012 Procedure   right chest wall nodule excision done on 06/24/2012 showed metastatic carcinoma margins were positive, but CT scan no metastatic disease, recommended chemotherapy but patient refused also refused reexcision    04/28/2013 Treatment Plan Change   patient went to [place] for alternative treatments and took herbal medications, tonics etc. But she could not afford these trips.    01/08/2014 Breast MRI   right breast multiple enhancing masses within the soft tissues largest 5.6 cm in wall skin surface and the capsule of the silicone prosthesis, contiguous nodules involving in inferomedial breast and tired and subcentimeter nodules across the midline    08/29/2015 Surgery   Right mastectomy: IDC with invol of skin and skin ulceration, breast capsule inv cancer, superior medial margin positive, 1/1 LN positive, grade 3, 14.3 cm, 3.5 cm, ALI, chest wall involv, ER 90-100%, PR 80-90%, HER-2 negative, Ki 67 40-50% T4CN1 (St 3B)    03/05/2016 -  Anti-estrogen oral therapy   Tamoxifen 20 mg daily stopped  due to headache and uncontrolled hypertension. Decrease to 10 mg daily 04/03/2016, stopped June 2017 and took an estrogen metabolizer over-the-counter; started Aromasin April 2018 from a [place] physician, Zoladex started on 05/2017 and switched to Letrozole in 09/2017   01/04/2020 Surgery   Patient presented to the ED on 01/04/20 for worsening lower extremity numbness and underwent a decompressive laminectomy with tumor resection at T9 that was complicated with acute blood loss anemia and leukocytosis.   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 with above-mentioned history of metastatic breast cancer currently on chemotherapy with Enhertu. She presents to the clinic today for  follow-up and treatment. Denies nausea numbness in fingers and toes. Denies any bleeding. Denies pain. Overall she is tolerating treatment well.   ALLERGIES:  is allergic to amoxicillin, penicillins, pork-derived products, penicillin g, and tamoxifen.  MEDICATIONS:  Current  Outpatient Medications  Medication Sig Dispense Refill   acetaminophen (TYLENOL) 500 MG tablet Take 2 tablets (1,000 mg total) by mouth every 6 (six) hours as needed for fever.     Ascorbic Acid (VITAMIN C PO) Take 1,000 mg by mouth daily.     atenolol (TENORMIN) 50 MG tablet Take 1 tablet (50 mg total) by mouth 2 (two) times daily. 60 tablet 3   celecoxib (CELEBREX) 200 MG capsule Take 1 capsule (200 mg total) by mouth at bedtime. 30 capsule 3   Cholecalciferol (VITAMIN D3) 125 MCG (5000 UT) CAPS Take 1 capsule (5,000 Units total) by mouth daily. 30 capsule 3   diazepam (VALIUM) 5 MG tablet Take 1 tablet (5 mg total) by mouth every 12 (twelve) hours as needed for anxiety or muscle spasms (sleep). 60 tablet 0   enoxaparin (LOVENOX) 40 MG/0.4ML injection Inject 0.4 mLs (40 mg total) into the skin daily. 24 mL 0   lip balm (CARMEX) ointment Apply 1 application topically as needed for lip care (cold sores). 7 g 0   megestrol (MEGACE ES) 625 MG/5ML suspension Take 5 mLs (625 mg total) by mouth daily. 150 mL 3   metroNIDAZOLE (METROGEL) 0.75 % gel Apply topically 2 (two) times daily. 45 g 0   morphine (MSIR) 30 MG tablet Take 1 tablet (30 mg total) by mouth every 4 (four) hours as needed for severe pain. 120 tablet 0   Multiple Vitamins-Minerals (MULTIVITAMIN WITH MINERALS) tablet FLORADIX Supplement-Iron +minerals     Nutritional Supplements (,FEEDING SUPPLEMENT, PROSOURCE PLUS) liquid Take 30 mLs by mouth 4 (four) times daily - after meals and at bedtime.     ondansetron (ZOFRAN) 8 MG tablet Take by mouth.     oxyCODONE ER (XTAMPZA ER) 9 MG C12A Take 1 capsule by mouth 2 times daily. 60 capsule 0   polyethylene glycol (MIRALAX / GLYCOLAX) 17 g packet Take 17 g by mouth daily. 30 each 0   potassium chloride SA (KLOR-CON M) 20 MEQ tablet Take 1 tablet (20 mEq total) by mouth daily. 30 tablet 0   pregabalin (LYRICA) 50 MG capsule Take 1 capsule (50 mg total) by mouth at bedtime. 30 capsule 6   Silver  Hydrogel GEL Apply to wound bed as directed with each dressing change. 44.4 mL 0   No current facility-administered medications for this visit.    PHYSICAL EXAMINATION: ECOG PERFORMANCE STATUS: 1 - Symptomatic but completely ambulatory  Vitals:   04/03/22 0809  BP: 130/84  Pulse: 83  Resp: 18  Temp: (!) 97.3 F (36.3 C)  SpO2: 100%   Filed Weights   04/03/22 0809  Weight: 144 lb 1.6 oz (65.4 kg)      LABORATORY DATA:  I have reviewed the data as listed    Latest Ref Rng & Units 03/13/2022    8:11 AM 02/20/2022    8:27 AM 01/31/2022    8:22 AM  CMP  Glucose 70 - 99 mg/dL 70   98   94    BUN 6 - 20 mg/dL _0 Creatinine 0.44 - 1.00 mg/dL 0.81   0.74   0.63    Sodium 135 - 145 mmol/L 143   142  141    Potassium 3.5 - 5.1 mmol/L 3.6   3.5   3.7    Chloride 98 - 111 mmol/L 105   106   107    CO2 22 - 32 mmol/L _0 Calcium 8.9 - 10.3 mg/dL 9.8   10.1   9.8    Total Protein 6.5 - 8.1 g/dL 7.4   7.6   7.4    Total Bilirubin 0.3 - 1.2 mg/dL 0.7   0.5   0.4    Alkaline Phos 38 - 126 U/L 153   158   163    AST 15 - 41 U/L 45   25   30    ALT 0 - 44 U/L 50   24   49      Lab Results  Component Value Date   WBC 3.2 (L) 04/03/2022   HGB 13.8 04/03/2022   HCT 39.7 04/03/2022   MCV 92.5 04/03/2022   PLT 259 04/03/2022   NEUTROABS 2.0 04/03/2022    ASSESSMENT & PLAN:  Breast cancer of upper-outer quadrant of right female breast ([place]) 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   Treatment summary: 1. Right mastectomy 08/29/2015 at [facility] (Dr.Singh): IDC with invol of skin and skin ulceration, breast capsule inv cancer, superior medial margin positive, 1/1 LN positive,  grade 3, 14.3 cm, 3.5 cm, ALI, chest wall involv, ER 90-100%, PR 80-90%, HER-2 negative, Ki 67 40-50% T4CN1 (St 3B) 2. Patient started tamoxifen but developed severe headache with severe hypertension and tamoxifen was discontinued 03/17/2016.  3. 02/2017 started on Exemestane, Zoladex started 05/29/2017 4.  Hospitalization for paraplegia: T9 vertebral body compression status post decompressive laminectomy and tumor resection 01/04/2020 5.  Palliative radiation to the thoracic spine 01/28/2020 6.  Ibrance with letrozole started April 2021 discontinued 07/24/2021 7.  Enhertu started 07/31/2021 every 3 weeks  ----------------------------------------------------------------------------------------------------------------- 05/11/2021: CT angiogram: Metastatic breast cancer soft tissue breast masses greater on the right, axillary supraclavicular lymphadenopathy, multiple bone metastases    Hospitalization 08/25/2021-09/05/2021: Worsening right chest wall pain and infection (patient has appointments with palliative care as well as infectious disease)   Current treatment: Enhertu started 10/17/2021, today cycle 9 Toxicities: Tolerating it extremely well without any major problems. CT CAP 01/30/2022: Regression of the large Rt breast mass and Axill LN, sclerotic bone mets, New lesion in liver 1.5 cm Chest wall tumors: Marked improvement  Wound care: She is able to manage with the help of home health Repeat CT scans 04/24/2022 has been ordered.    Orders Placed This Encounter  Procedures   CT CHEST ABDOMEN PELVIS W CONTRAST    Standing Status:   Future    Standing Expiration Date:   04/04/2023    Order Specific Question:   Is patient pregnant?    Answer:   No    Order Specific Question:   Preferred imaging location?    Answer:   [facility]    Order Specific Question:   Is Oral Contrast requested for this exam?    Answer:   Yes, Per Radiology protocol   The patient has a good understanding of the  overall plan. she agrees with it. she will call  with any problems that may develop before the next visit here. Total time spent: 30 mins including face to face time and time spent for planning, charting and co-ordination of care   Harriette Ohara, MD 04/03/22    I Gardiner Coins am scribing for Dr. Lindi Adie  I have reviewed the above documentation for accuracy and completeness, and I agree with the above.

## 2022-03-30 MED FILL — Dexamethasone Sodium Phosphate Inj 100 MG/10ML: INTRAMUSCULAR | Qty: 1 | Status: AC

## 2022-04-03 ENCOUNTER — Encounter: Payer: Self-pay | Admitting: Nurse Practitioner

## 2022-04-03 ENCOUNTER — Inpatient Hospital Stay (HOSPITAL_BASED_OUTPATIENT_CLINIC_OR_DEPARTMENT_OTHER): Payer: Medicare Other | Admitting: Hematology and Oncology

## 2022-04-03 ENCOUNTER — Inpatient Hospital Stay: Payer: Medicare Other

## 2022-04-03 ENCOUNTER — Inpatient Hospital Stay (HOSPITAL_BASED_OUTPATIENT_CLINIC_OR_DEPARTMENT_OTHER): Payer: Medicare Other | Admitting: Nurse Practitioner

## 2022-04-03 ENCOUNTER — Other Ambulatory Visit: Payer: Self-pay

## 2022-04-03 VITALS — BP 130/76 | HR 79 | Temp 97.9°F | Resp 18

## 2022-04-03 VITALS — BP 130/84 | HR 83 | Temp 97.3°F | Resp 18 | Ht 65.0 in | Wt 144.1 lb

## 2022-04-03 DIAGNOSIS — C7949 Secondary malignant neoplasm of other parts of nervous system: Secondary | ICD-10-CM

## 2022-04-03 DIAGNOSIS — C50919 Malignant neoplasm of unspecified site of unspecified female breast: Secondary | ICD-10-CM

## 2022-04-03 DIAGNOSIS — Z515 Encounter for palliative care: Secondary | ICD-10-CM

## 2022-04-03 DIAGNOSIS — Z170 Estrogen receptor positive status [ER+]: Secondary | ICD-10-CM

## 2022-04-03 DIAGNOSIS — R530 Neoplastic (malignant) related fatigue: Secondary | ICD-10-CM

## 2022-04-03 DIAGNOSIS — C50411 Malignant neoplasm of upper-outer quadrant of right female breast: Secondary | ICD-10-CM

## 2022-04-03 DIAGNOSIS — Z5112 Encounter for antineoplastic immunotherapy: Secondary | ICD-10-CM | POA: Diagnosis not present

## 2022-04-03 DIAGNOSIS — R634 Abnormal weight loss: Secondary | ICD-10-CM

## 2022-04-03 LAB — CBC WITH DIFFERENTIAL (CANCER CENTER ONLY)
Abs Immature Granulocytes: 0.01 10*3/uL (ref 0.00–0.07)
Basophils Absolute: 0 10*3/uL (ref 0.0–0.1)
Basophils Relative: 0 %
Eosinophils Absolute: 0.2 10*3/uL (ref 0.0–0.5)
Eosinophils Relative: 5 %
HCT: 39.7 % (ref 36.0–46.0)
Hemoglobin: 13.8 g/dL (ref 12.0–15.0)
Immature Granulocytes: 0 %
Lymphocytes Relative: 21 %
Lymphs Abs: 0.7 10*3/uL (ref 0.7–4.0)
MCH: 32.2 pg (ref 26.0–34.0)
MCHC: 34.8 g/dL (ref 30.0–36.0)
MCV: 92.5 fL (ref 80.0–100.0)
Monocytes Absolute: 0.3 10*3/uL (ref 0.1–1.0)
Monocytes Relative: 10 %
Neutro Abs: 2 10*3/uL (ref 1.7–7.7)
Neutrophils Relative %: 64 %
Platelet Count: 259 10*3/uL (ref 150–400)
RBC: 4.29 MIL/uL (ref 3.87–5.11)
RDW: 14.2 % (ref 11.5–15.5)
WBC Count: 3.2 10*3/uL — ABNORMAL LOW (ref 4.0–10.5)
nRBC: 0 % (ref 0.0–0.2)

## 2022-04-03 LAB — CMP (CANCER CENTER ONLY)
ALT: 23 U/L (ref 0–44)
AST: 24 U/L (ref 15–41)
Albumin: 3.8 g/dL (ref 3.5–5.0)
Alkaline Phosphatase: 151 U/L — ABNORMAL HIGH (ref 38–126)
Anion gap: 8 (ref 5–15)
BUN: 10 mg/dL (ref 6–20)
CO2: 27 mmol/L (ref 22–32)
Calcium: 9.6 mg/dL (ref 8.9–10.3)
Chloride: 107 mmol/L (ref 98–111)
Creatinine: 0.81 mg/dL (ref 0.44–1.00)
GFR, Estimated: >60 mL/min (ref >60)
Glucose, Bld: 114 mg/dL — ABNORMAL HIGH (ref 70–99)
Potassium: 3.4 mmol/L — ABNORMAL LOW (ref 3.5–5.1)
Sodium: 142 mmol/L (ref 135–145)
Total Bilirubin: 0.5 mg/dL (ref 0.3–1.2)
Total Protein: 6.6 g/dL (ref 6.5–8.1)

## 2022-04-03 MED ORDER — FAM-TRASTUZUMAB DERUXTECAN-NXKI CHEMO 100 MG IV SOLR
360.0000 mg | Freq: Once | INTRAVENOUS | Status: AC
  Administered 2022-04-03: 360 mg via INTRAVENOUS
  Filled 2022-04-03: qty 18

## 2022-04-03 MED ORDER — ACETAMINOPHEN 325 MG PO TABS
650.0000 mg | ORAL_TABLET | Freq: Once | ORAL | Status: AC
  Administered 2022-04-03: 650 mg via ORAL
  Filled 2022-04-03: qty 2

## 2022-04-03 MED ORDER — DEXTROSE 5 % IV SOLN: Freq: Once | INTRAVENOUS | Status: AC

## 2022-04-03 MED ORDER — SODIUM CHLORIDE 0.9 % IV SOLN
10.0000 mg | Freq: Once | INTRAVENOUS | Status: AC
  Administered 2022-04-03: 10 mg via INTRAVENOUS
  Filled 2022-04-03: qty 10

## 2022-04-03 MED ORDER — DIPHENHYDRAMINE HCL 25 MG PO CAPS
25.0000 mg | ORAL_CAPSULE | Freq: Once | ORAL | Status: AC
  Administered 2022-04-03: 25 mg via ORAL
  Filled 2022-04-03: qty 1

## 2022-04-03 MED ORDER — PALONOSETRON HCL INJECTION 0.25 MG/5ML
0.2500 mg | Freq: Once | INTRAVENOUS | Status: AC
  Administered 2022-04-03: 0.25 mg via INTRAVENOUS
  Filled 2022-04-03: qty 5

## 2022-04-03 NOTE — Assessment & Plan Note (Signed)
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  Treatment summary: 1.Right mastectomy 08/29/2015 at [place] (Dr.Singh): IDC with invol of skin and skin ulceration, breast capsule inv cancer, superior medial margin positive, 1/1 LN positive, grade 3, 14.3 cm, 3.5 cm, ALI, chest wall involv, ER 90-100%, PR 80-90%, HER-2 negative, Ki 67 40-50% T4CN1 (St 3B) 2.Patient started tamoxifen but developed severe headache with severe hypertension and tamoxifen was discontinued 03/17/2016. 3.02/2017 started on Exemestane, Zoladex started 05/29/2017 4.Hospitalization for paraplegia: T9 vertebral body compression status post decompressive laminectomy and tumor resection 01/04/2020 5.Palliative radiation to the thoracic spine 01/28/2020 6.Ibrance with letrozole started April 2021discontinued 07/24/2021 7.Enhertu started 07/31/2021 every 3 weeks  ----------------------------------------------------------------------------------------------------------------- 05/11/2021: CT angiogram: Metastatic breast cancer soft tissue breast masses greater on the right, axillary supraclavicular lymphadenopathy, multiple bone metastases  Hospitalization 08/25/2021-09/05/2021: Worsening right chest wall pain and infection(patient has appointments with palliative care as well as infectious disease)  Current treatment: Enhertu started12/13/2022, today cycle9 Toxicities:Tolerating it extremely well without any major problems. CT CAP 01/30/2022: Regression of the large Rt breast mass and Axill LN, sclerotic bone mets, New lesion in liver 1.5 cm Chest wall tumors: Marked improvement  Wound care: She is able to manage with the help of home  health Repeat CT scans 05/11/2022 has been ordered.

## 2022-04-03 NOTE — Progress Notes (Addendum)
[place]  [phone] [phone]   Name: Misty Arnold Date: 04/03/2022 MRN: [id number]  DOB: 1971/05/19  Patient Care Team: Nicholas Lose, MD as PCP - General (Hematology and Oncology)   INTERVAL HISTORY: Misty Arnold is a 51 y.o. female with right metastatic breast cancer s/p mastectomy, large right chest wound secondary to necrotic mass, weankess due to T9 vertebral compression s/p tumor resection and decompressive laminectomy, and hypertension. Patient did not receive treatment for some time after seeking care in [place]. Currently receiving Enhertu treatment. Palliative following for symptom management and goals of care.   SOCIAL HISTORY:     reports that she has never smoked. She has never used smokeless tobacco. She reports that she does not currently use alcohol. She reports that she does not use drugs.  ADVANCE DIRECTIVES:  Advance directives completed during recent hospitalization 08/27/2021.  Documents are on file and have been reviewed personally and [name].  CODE STATUS: Full code  PAST MEDICAL HISTORY: Past Medical History:  Diagnosis Date   Abnormal uterine bleeding    Anemia    Breast cancer ([place])    Depression    Fibroid    Hormone disorder    Hypertension    Infertility, female    ALLERGIES:  is allergic to amoxicillin, penicillins, pork-derived products, penicillin g, and tamoxifen.  MEDICATIONS:  Current Outpatient Medications  Medication Sig Dispense Refill   acetaminophen (TYLENOL) 500 MG tablet Take 2 tablets (1,000 mg total) by mouth every 6 (six) hours as needed for fever.     Ascorbic Acid (VITAMIN C PO) Take 1,000 mg by mouth daily.     atenolol (TENORMIN) 50 MG tablet Take 1 tablet (50 mg total) by mouth 2 (two) times daily. 60 tablet 3   celecoxib (CELEBREX) 200 MG capsule Take 1 capsule (200 mg total) by mouth at bedtime. 30 capsule 3   Cholecalciferol (VITAMIN D3) 125 MCG (5000  UT) CAPS Take 1 capsule (5,000 Units total) by mouth daily. 30 capsule 3   diazepam (VALIUM) 5 MG tablet Take 1 tablet (5 mg total) by mouth every 12 (twelve) hours as needed for anxiety or muscle spasms (sleep). 60 tablet 0   enoxaparin (LOVENOX) 40 MG/0.4ML injection Inject 0.4 mLs (40 mg total) into the skin daily. 24 mL 0   lip balm (CARMEX) ointment Apply 1 application topically as needed for lip care (cold sores). 7 g 0   megestrol (MEGACE ES) 625 MG/5ML suspension Take 5 mLs (625 mg total) by mouth daily. 150 mL 3   metroNIDAZOLE (METROGEL) 0.75 % gel Apply topically 2 (two) times daily. 45 g 0   morphine (MSIR) 30 MG tablet Take 1 tablet (30 mg total) by mouth every 4 (four) hours as needed for severe pain. 120 tablet 0   Multiple Vitamins-Minerals (MULTIVITAMIN WITH MINERALS) tablet FLORADIX Supplement-Iron +minerals     Nutritional Supplements (,FEEDING SUPPLEMENT, PROSOURCE PLUS) liquid Take 30 mLs by mouth 4 (four) times daily - after meals and at bedtime.     ondansetron (ZOFRAN) 8 MG tablet Take by mouth.     oxyCODONE ER (XTAMPZA ER) 9 MG C12A Take 1 capsule by mouth 2 times daily. 60 capsule 0   polyethylene glycol (MIRALAX / GLYCOLAX) 17 g packet Take 17 g by mouth daily. 30 each 0   potassium chloride SA (KLOR-CON M) 20 MEQ tablet Take 1 tablet (20 mEq total) by mouth daily. 30 tablet 0   pregabalin (LYRICA) 50  MG capsule Take 1 capsule (50 mg total) by mouth at bedtime. 30 capsule 6   Silver Hydrogel GEL Apply to wound bed as directed with each dressing change. 44.4 mL 0   No current facility-administered medications for this visit.    VITAL SIGNS: There were no vitals taken for this visit. There were no vitals filed for this visit.  Estimated body mass index is 23.98 kg/m as calculated from the following:   Height as of an earlier encounter on 04/03/22: '5\' 5"'$  (1.651 m).   Weight as of an earlier encounter on 04/03/22: 144 lb 1.6 oz (65.4 kg).  PERFORMANCE STATUS (ECOG) :  0 - Asymptomatic  Physical Exam NAD, ambulatory, well developed RRR Right breast wound healing, no drainage, borders now closed AAO x3, mood appropriate   IMPRESSION:  I saw [name] during her infusion. No acute distress. She is much improved. Looks amazing and expresses her appreciation of ongoing support. Tolerating her Enhertu treatments.   Ms. Stevison pain is resolved. She is not having to take any form of pain medication other than occasional Tylenol use for minor aches. Appetite is much improved.  Is drinking boost protein drinks twice daily. Her weight has increased 144lbs up from 134.8 lbs on 4/18.   Denies constipation, diarrhea, nausea, or vomiting.    Patient knows to contact our office as needed. We will continue to follow and be available. I am glad she is much improved.      PLAN: Much improved. Pain is resolved. Occasional Tylenol use for minor aches and headaches. Appetite much improved. Weight has increased. 1-2 boost daily. Patient supplied with case from our office. Right breast wound healing.  I will plan to see her back in 6 weeks.  Patient expressed understanding and was in agreement with this plan. She also understands that She can call the clinic at any time with any questions, concerns, or complaints.   Number and complexity of problems addressed: HIGH - 1 or more chronic illnesses with SEVERE exacerbation, progression, or side effects of treatment - advanced cancer, pain. Any controlled substances utilized were prescribed in the context of palliative care.   Time Total: 20 min   Visit consisted of counseling and education dealing with the complex and emotionally intense issues of symptom management and palliative care in the setting of serious and potentially life-threatening illness.Greater than 50%  of this time was spent counseling and coordinating care related to the above assessment and plan.  [name], AGPCNP-BC  Palliative Medicine  Team/[place]

## 2022-04-03 NOTE — Patient Instructions (Signed)
[facility]  Discharge Instructions: Thank you for choosing [facility] to provide your oncology and hematology care.   If you have a lab appointment with the Cancer Center, please go directly to the Cancer Center and check in at the registration area.   Wear comfortable clothing and clothing appropriate for easy access to any Portacath or PICC line.   We strive to give you quality time with your provider. You may need to reschedule your appointment if you arrive late (15 or more minutes).  Arriving late affects you and other patients whose appointments are after yours.  Also, if you miss three or more appointments without notifying the office, you may be dismissed from the clinic at the provider's discretion.      For prescription refill requests, have your pharmacy contact our office and allow 72 hours for refills to be completed.    Today you received the following chemotherapy and/or immunotherapy agent: Enhertu      To help prevent nausea and vomiting after your treatment, we encourage you to take your nausea medication as directed.  BELOW ARE SYMPTOMS THAT SHOULD BE REPORTED IMMEDIATELY: *FEVER GREATER THAN 100.4 F (38 C) OR HIGHER *CHILLS OR SWEATING *NAUSEA AND VOMITING THAT IS NOT CONTROLLED WITH YOUR NAUSEA MEDICATION *UNUSUAL SHORTNESS OF BREATH *UNUSUAL BRUISING OR BLEEDING *URINARY PROBLEMS (pain or burning when urinating, or frequent urination) *BOWEL PROBLEMS (unusual diarrhea, constipation, pain near the anus) TENDERNESS IN MOUTH AND THROAT WITH OR WITHOUT PRESENCE OF ULCERS (sore throat, sores in mouth, or a toothache) UNUSUAL RASH, SWELLING OR PAIN  UNUSUAL VAGINAL DISCHARGE OR ITCHING   Items with * indicate a potential emergency and should be followed up as soon as possible or go to the Emergency Department if any problems should occur.  Please show the CHEMOTHERAPY ALERT CARD or IMMUNOTHERAPY ALERT CARD at check-in to the  Emergency Department and triage nurse.  Should you have questions after your visit or need to cancel or reschedule your appointment, please contact [facility]  Dept: [phone]  and follow the prompts.  Office hours are 8:00 a.m. to 4:30 p.m. Monday - Friday. Please note that voicemails left after 4:00 p.m. may not be returned until the following business day.  We are closed weekends and major holidays. You have access to a nurse at all times for urgent questions. Please call the main number to the clinic Dept: [phone] and follow the prompts.   For any non-urgent questions, you may also contact your provider using MyChart. We now offer e-Visits for anyone 18 and older to request care online for non-urgent symptoms. For details visit [email or web address].   Also download the MyChart app! Go to the app store, search "MyChart", open the app, select [facility], and log in with your MyChart username and password.  Due to Covid, a mask is required upon entering the hospital/clinic. If you do not have a mask, one will be given to you upon arrival. For doctor visits, patients may have 1 support person aged 18 or older with them. For treatment visits, patients cannot have anyone with them due to current Covid guidelines and our immunocompromised population.  

## 2022-04-04 ENCOUNTER — Inpatient Hospital Stay: Payer: Medicare Other | Admitting: Hematology and Oncology

## 2022-04-04 ENCOUNTER — Inpatient Hospital Stay: Payer: Medicare Other

## 2022-04-10 NOTE — Progress Notes (Signed)
Patient Care Team: Nicholas Lose, MD as PCP - General (Hematology and Oncology)  DIAGNOSIS:  Encounter Diagnoses  Name Primary?   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   Malignant neoplasm of upper-outer quadrant of right breast in female, estrogen receptor positive ([place])     SUMMARY OF ONCOLOGIC HISTORY: Oncology History  Breast cancer of upper-outer quadrant of right female breast ([place])  09/21/2010 Mammogram   right breast linear and segmental pleomorphic calcifications from 4:00 to 6:00 position ultrasound revealed 1.6 cm and 1.1 cm masses   09/27/2010 Initial Biopsy   ultrasound-guided biopsy of all masses showed DCIS grade 2; patient went to cancer treatment centers of [place] for second opinion and delayed therapy   01/26/2011 Surgery   right mastectomy followed by reconstruction: 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 But she could not afford these trips.   01/08/2014 Breast MRI   right breast multiple enhancing masses within the soft tissues largest 5.6 cm in wall skin surface and the capsule of the silicone prosthesis, contiguous nodules involving in inferomedial breast and tired and subcentimeter nodules across the midline   08/29/2015 Surgery   Right mastectomy: IDC with invol of skin and skin ulceration, breast capsule inv cancer, superior medial margin positive, 1/1 LN positive, grade 3, 14.3 cm, 3.5 cm, ALI, chest wall involv, ER 90-100%, PR 80-90%, HER-2 negative, Ki 67 40-50% T4CN1 (St 3B)   03/05/2016 -  Anti-estrogen oral therapy    Tamoxifen 20 mg daily stopped due to headache and uncontrolled hypertension. Decrease to 10 mg daily 04/03/2016, stopped June 2017 and took an estrogen metabolizer over-the-counter; started Aromasin April 2018 from a [place] physician, Zoladex started on 05/2017 and switched to Letrozole in 09/2017   01/04/2020 Surgery   Patient presented to the ED on 01/04/20 for worsening lower extremity numbness and underwent a decompressive laminectomy with tumor resection at T9 that was complicated with acute blood loss anemia and leukocytosis.   01/28/2020 - 02/15/2020 Radiation Therapy   Palliative radiation at site of thoracic tumor resection   03/03/2020 Miscellaneous    Ibrance with letrozole and Zoladex    Miscellaneous   Guardant 360: ESR 1 mutations, PI K3 CA mutation, EGFR mutation, T p53 mutation   10/17/2021 -  Chemotherapy   Patient is on Treatment Plan : BREAST METASTATIC fam-trastuzumab deruxtecan-nxki (Enhertu) q21d     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[place])  01/04/2020 Initial Diagnosis   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[place])   10/17/2021 -  Chemotherapy   Patient is on Treatment Plan : BREAST METASTATIC fam-trastuzumab deruxtecan-nxki (Enhertu) q21d     Primary malignant neoplasm of breast with metastasis ([place])  01/08/2020 Initial Diagnosis   Metastatic breast cancer ([place])   10/17/2021 -  Chemotherapy   Patient is on Treatment Plan : BREAST METASTATIC fam-trastuzumab deruxtecan-nxki (Enhertu) q21d       CHIEF COMPLIANT: Day 1 cycle 10 enhertu   INTERVAL HISTORY: Misty Arnold is a 51 y.o. with above-mentioned history of metastatic breast cancer currently on chemotherapy with Enhertu. She presents to the clinic today for follow-up.  States that everything was going well. Denies any side effects or symptoms today.    ALLERGIES:  is allergic to amoxicillin, penicillins, pork-derived products, penicillin g, and tamoxifen.  MEDICATIONS:  Current Outpatient Medications   Medication Sig Dispense Refill   acetaminophen (TYLENOL) 500 MG tablet Take 2 tablets (1,000 mg total) by mouth every 6 (six) hours as needed for fever.     Ascorbic Acid (VITAMIN C PO) Take 1,000 mg by mouth daily.     atenolol (TENORMIN) 50 MG tablet Take 1 tablet (50 mg total) by mouth 2 (two) times daily. 180 tablet 3   celecoxib (CELEBREX) 200 MG capsule Take 1 capsule (200 mg total) by mouth at bedtime. 30 capsule 3   Cholecalciferol (VITAMIN D3) 125 MCG (5000 UT) CAPS Take 1 capsule (5,000 Units total) by mouth daily. 30 capsule 3   diazepam (VALIUM) 5 MG tablet Take 1 tablet (5 mg total) by mouth every 12 (twelve) hours as needed for anxiety or muscle spasms (sleep). 60 tablet 0   enoxaparin (LOVENOX) 40 MG/0.4ML injection Inject 0.4 mLs (40 mg total) into the skin daily. 24 mL 0   lip balm (CARMEX) ointment Apply 1 application topically as needed for lip care (cold sores). 7 g 0   megestrol (MEGACE ES) 625 MG/5ML suspension Take 5 mLs (625 mg total) by mouth daily. 150 mL 3   metroNIDAZOLE (METROGEL) 0.75 % gel Apply topically 2 (two) times daily. 45 g 0   morphine (MSIR) 30 MG tablet Take 1 tablet (30 mg total) by mouth every 4 (four) hours as needed for severe pain. 120 tablet 0   Multiple Vitamins-Minerals (MULTIVITAMIN WITH MINERALS) tablet FLORADIX Supplement-Iron +minerals     Nutritional Supplements (,FEEDING SUPPLEMENT, PROSOURCE PLUS) liquid Take 30 mLs by mouth 4 (four) times daily - after meals and at bedtime.     ondansetron (ZOFRAN) 8 MG tablet Take by mouth.     oxyCODONE ER (XTAMPZA ER) 9 MG C12A Take 1 capsule by mouth 2 times daily. 60 capsule 0   polyethylene glycol (MIRALAX / GLYCOLAX) 17 g packet Take 17 g by mouth daily. 30 each 0   potassium chloride SA (KLOR-CON M) 20 MEQ tablet Take 1 tablet (20 mEq total) by mouth daily. 30 tablet 0   pregabalin (LYRICA) 50 MG capsule Take 1 capsule (50 mg total) by mouth at bedtime. 30 capsule 6   Silver Hydrogel GEL Apply to  wound bed as directed with each dressing change. 44.4 mL 0   No current facility-administered medications for this visit.    PHYSICAL EXAMINATION: ECOG PERFORMANCE STATUS: 1 - Symptomatic but completely ambulatory  Vitals:   04/24/22 0814  BP: (!) 174/89  Pulse: 84  Resp: 18  Temp: 97.7 F (36.5 C)  SpO2: 99%   Filed Weights   04/24/22 0814  Weight: 144 lb 4.8 oz (65.5 kg)      LABORATORY DATA:  I have reviewed the data as listed    Latest Ref Rng & Units 04/03/2022    8:01 AM 03/13/2022    8:11 AM 02/20/2022    8:27 AM  CMP  Glucose 70 - 99 mg/dL 114  70  98   BUN 6 - 20 mg/dL 10  16  11    Creatinine 0.44 - 1.00 mg/dL 0.81  0.81  0.74   Sodium 135 - 145 mmol/L 142  143  142   Potassium 3.5 - 5.1 mmol/L 3.4  3.6  3.5   Chloride 98 -  111 mmol/L 107  105  106   CO2 22 - 32 mmol/L 27  28  27    Calcium 8.9 - 10.3 mg/dL 9.6  9.8  10.1   Total Protein 6.5 - 8.1 g/dL 6.6  7.4  7.6   Total Bilirubin 0.3 - 1.2 mg/dL 0.5  0.7  0.5   Alkaline Phos 38 - 126 U/L 151  153  158   AST 15 - 41 U/L 24  45  25   ALT 0 - 44 U/L 23  50  24     Lab Results  Component Value Date   WBC 3.1 (L) 04/24/2022   HGB 14.4 04/24/2022   HCT 41.5 04/24/2022   MCV 92.0 04/24/2022   PLT 309 04/24/2022   NEUTROABS 1.7 04/24/2022    ASSESSMENT & PLAN:  Breast cancer of upper-outer quadrant of right female breast ([place]) Recurrent right breast cancer initially DCIS treated with mastectomy followed by reconstruction and later developed chest wall recurrence in 2013 ER/PR positive HER-2 negative, patient underwent alternative therapies in [place] but could not afford the trips and hence she has not been on any breast cancer therapy for a long time.   Treatment summary: 1. Right mastectomy 08/29/2015 at [facility] (Dr.Singh): IDC with invol of skin and skin ulceration, breast capsule inv cancer, superior medial margin positive, 1/1 LN positive, grade 3, 14.3 cm, 3.5 cm, ALI, chest wall involv, ER 90-100%, PR  80-90%, HER-2 negative, Ki 67 40-50% T4CN1 (St 3B) 2. Patient started tamoxifen but developed severe headache with severe hypertension and tamoxifen was discontinued 03/17/2016.  3. 02/2017 started on Exemestane, Zoladex started 05/29/2017 4.  Hospitalization for paraplegia: T9 vertebral body compression status post decompressive laminectomy and tumor resection 01/04/2020 5.  Palliative radiation to the thoracic spine 01/28/2020 6.  Ibrance with letrozole started April 2021 discontinued 07/24/2021 7.  Enhertu started 07/31/2021 every 3 weeks  ----------------------------------------------------------------------------------------------------------------- 05/11/2021: CT angiogram: Metastatic breast cancer soft tissue breast masses greater on the right, axillary supraclavicular lymphadenopathy, multiple bone metastases    Hospitalization 08/25/2021-09/05/2021: Worsening right chest wall pain and infection (patient has appointments with palliative care as well as infectious disease)   Current treatment: Enhertu started 10/17/2021, today cycle 10 Toxicities: Tolerating it extremely well without any major problems. CT CAP 01/30/2022: Regression of the large Rt breast mass and Axill LN, sclerotic bone mets, New lesion in liver 1.5 cm Chest wall tumors: Marked improvement   Wound care: She is able to manage with the help of home health Repeat CT scans 04/26/2022 has been ordered.  They might be able to do the CT scans today.  Return to clinic in 3 weeks for cycle 11.  No orders of the defined types were placed in this encounter.  The patient has a good understanding of the overall plan. she agrees with it. she will call with any problems that may develop before the next visit here. Total time spent: 30 mins including face to face time and time spent for planning, charting and co-ordination of care   Harriette Ohara, MD 04/24/22    I Gardiner Coins am scribing for Dr. Lindi Adie  I have reviewed the above  documentation for accuracy and completeness, and I agree with the above.

## 2022-04-23 MED FILL — Dexamethasone Sodium Phosphate Inj 100 MG/10ML: INTRAMUSCULAR | Qty: 1 | Status: AC

## 2022-04-24 ENCOUNTER — Ambulatory Visit (HOSPITAL_COMMUNITY)
Admission: RE | Admit: 2022-04-24 | Discharge: 2022-04-24 | Disposition: A | Discharge status: Home / Self Care | Payer: Medicare Other | Source: Ambulatory Visit | Attending: Hematology and Oncology | Admitting: Hematology and Oncology

## 2022-04-24 ENCOUNTER — Inpatient Hospital Stay (HOSPITAL_BASED_OUTPATIENT_CLINIC_OR_DEPARTMENT_OTHER): Payer: Medicare Other | Admitting: Hematology and Oncology

## 2022-04-24 ENCOUNTER — Inpatient Hospital Stay: Payer: Medicare Other

## 2022-04-24 ENCOUNTER — Other Ambulatory Visit: Payer: Self-pay | Admitting: Lab

## 2022-04-24 ENCOUNTER — Encounter: Payer: Self-pay | Admitting: Hematology and Oncology

## 2022-04-24 ENCOUNTER — Other Ambulatory Visit: Payer: Self-pay

## 2022-04-24 ENCOUNTER — Other Ambulatory Visit (HOSPITAL_COMMUNITY): Payer: Self-pay

## 2022-04-24 ENCOUNTER — Inpatient Hospital Stay: Payer: Medicare Other | Attending: Hematology and Oncology

## 2022-04-24 VITALS — BP 174/89 | HR 84 | Temp 97.7°F | Resp 18 | Ht 65.0 in | Wt 144.3 lb

## 2022-04-24 VITALS — BP 179/109 | HR 82 | Resp 18

## 2022-04-24 DIAGNOSIS — Z9011 Acquired absence of right breast and nipple: Secondary | ICD-10-CM | POA: Diagnosis not present

## 2022-04-24 DIAGNOSIS — Z5111 Encounter for antineoplastic chemotherapy: Secondary | ICD-10-CM | POA: Insufficient documentation

## 2022-04-24 DIAGNOSIS — C50411 Malignant neoplasm of upper-outer quadrant of right female breast: Secondary | ICD-10-CM | POA: Insufficient documentation

## 2022-04-24 DIAGNOSIS — C7949 Secondary malignant neoplasm of other parts of nervous system: Secondary | ICD-10-CM

## 2022-04-24 DIAGNOSIS — Z170 Estrogen receptor positive status [ER+]: Secondary | ICD-10-CM | POA: Insufficient documentation

## 2022-04-24 DIAGNOSIS — C7951 Secondary malignant neoplasm of bone: Secondary | ICD-10-CM | POA: Insufficient documentation

## 2022-04-24 DIAGNOSIS — C50919 Malignant neoplasm of unspecified site of unspecified female breast: Secondary | ICD-10-CM

## 2022-04-24 DIAGNOSIS — Z5112 Encounter for antineoplastic immunotherapy: Secondary | ICD-10-CM | POA: Insufficient documentation

## 2022-04-24 LAB — CBC WITH DIFFERENTIAL (CANCER CENTER ONLY)
Abs Immature Granulocytes: 0.02 10*3/uL (ref 0.00–0.07)
Basophils Absolute: 0 10*3/uL (ref 0.0–0.1)
Basophils Relative: 1 %
Eosinophils Absolute: 0.2 10*3/uL (ref 0.0–0.5)
Eosinophils Relative: 6 %
HCT: 41.5 % (ref 36.0–46.0)
Hemoglobin: 14.4 g/dL (ref 12.0–15.0)
Immature Granulocytes: 1 %
Lymphocytes Relative: 24 %
Lymphs Abs: 0.8 10*3/uL (ref 0.7–4.0)
MCH: 31.9 pg (ref 26.0–34.0)
MCHC: 34.7 g/dL (ref 30.0–36.0)
MCV: 92 fL (ref 80.0–100.0)
Monocytes Absolute: 0.4 10*3/uL (ref 0.1–1.0)
Monocytes Relative: 12 %
Neutro Abs: 1.7 10*3/uL (ref 1.7–7.7)
Neutrophils Relative %: 56 %
Platelet Count: 309 10*3/uL (ref 150–400)
RBC: 4.51 MIL/uL (ref 3.87–5.11)
RDW: 13.6 % (ref 11.5–15.5)
WBC Count: 3.1 10*3/uL — ABNORMAL LOW (ref 4.0–10.5)
nRBC: 0 % (ref 0.0–0.2)

## 2022-04-24 LAB — CMP (CANCER CENTER ONLY)
ALT: 24 U/L (ref 0–44)
AST: 26 U/L (ref 15–41)
Albumin: 4.1 g/dL (ref 3.5–5.0)
Alkaline Phosphatase: 144 U/L — ABNORMAL HIGH (ref 38–126)
Anion gap: 7 (ref 5–15)
BUN: 14 mg/dL (ref 6–20)
CO2: 28 mmol/L (ref 22–32)
Calcium: 9.8 mg/dL (ref 8.9–10.3)
Chloride: 107 mmol/L (ref 98–111)
Creatinine: 0.88 mg/dL (ref 0.44–1.00)
GFR, Estimated: >60 mL/min (ref >60)
Glucose, Bld: 108 mg/dL — ABNORMAL HIGH (ref 70–99)
Potassium: 3.5 mmol/L (ref 3.5–5.1)
Sodium: 142 mmol/L (ref 135–145)
Total Bilirubin: 0.3 mg/dL (ref 0.3–1.2)
Total Protein: 7.1 g/dL (ref 6.5–8.1)

## 2022-04-24 MED ORDER — FAM-TRASTUZUMAB DERUXTECAN-NXKI CHEMO 100 MG IV SOLR
360.0000 mg | Freq: Once | INTRAVENOUS | Status: AC
  Administered 2022-04-24: 360 mg via INTRAVENOUS
  Filled 2022-04-24: qty 18

## 2022-04-24 MED ORDER — ATENOLOL 50 MG PO TABS
50.0000 mg | ORAL_TABLET | Freq: Two times a day (BID) | ORAL | 3 refills | Status: DC
  Filled 2022-04-24: qty 60, 30d supply, fill #0
  Filled 2022-06-06 – 2022-06-19 (×2): qty 180, 90d supply, fill #0

## 2022-04-24 MED ORDER — DIPHENHYDRAMINE HCL 25 MG PO CAPS
25.0000 mg | ORAL_CAPSULE | Freq: Once | ORAL | Status: AC
  Administered 2022-04-24: 25 mg via ORAL
  Filled 2022-04-24: qty 1

## 2022-04-24 MED ORDER — PALONOSETRON HCL INJECTION 0.25 MG/5ML
0.2500 mg | Freq: Once | INTRAVENOUS | Status: AC
  Administered 2022-04-24: 0.25 mg via INTRAVENOUS
  Filled 2022-04-24: qty 5

## 2022-04-24 MED ORDER — IOHEXOL 300 MG/ML  SOLN
100.0000 mL | Freq: Once | INTRAMUSCULAR | Status: AC | PRN
Start: 2022-04-24 — End: 2022-04-24
  Administered 2022-04-24: 100 mL via INTRAVENOUS

## 2022-04-24 MED ORDER — DEXTROSE 5 % IV SOLN: Freq: Once | INTRAVENOUS | Status: AC

## 2022-04-24 MED ORDER — SODIUM CHLORIDE (PF) 0.9 % IJ SOLN
INTRAMUSCULAR | Status: AC
  Filled 2022-04-24: qty 50

## 2022-04-24 MED ORDER — ACETAMINOPHEN 325 MG PO TABS
650.0000 mg | ORAL_TABLET | Freq: Once | ORAL | Status: AC
  Administered 2022-04-24: 650 mg via ORAL
  Filled 2022-04-24: qty 2

## 2022-04-24 MED ORDER — SODIUM CHLORIDE 0.9 % IV SOLN
10.0000 mg | Freq: Once | INTRAVENOUS | Status: AC
  Administered 2022-04-24: 10 mg via INTRAVENOUS
  Filled 2022-04-24: qty 10

## 2022-04-24 NOTE — Patient Instructions (Signed)
[facility]  Discharge Instructions: Thank you for choosing [facility] to provide your oncology and hematology care.   If you have a lab appointment with the Cancer Center, please go directly to the Cancer Center and check in at the registration area.   Wear comfortable clothing and clothing appropriate for easy access to any Portacath or PICC line.   We strive to give you quality time with your provider. You may need to reschedule your appointment if you arrive late (15 or more minutes).  Arriving late affects you and other patients whose appointments are after yours.  Also, if you miss three or more appointments without notifying the office, you may be dismissed from the clinic at the provider's discretion.      For prescription refill requests, have your pharmacy contact our office and allow 72 hours for refills to be completed.    Today you received the following chemotherapy and/or immunotherapy agent: Enhertu      To help prevent nausea and vomiting after your treatment, we encourage you to take your nausea medication as directed.  BELOW ARE SYMPTOMS THAT SHOULD BE REPORTED IMMEDIATELY: *FEVER GREATER THAN 100.4 F (38 C) OR HIGHER *CHILLS OR SWEATING *NAUSEA AND VOMITING THAT IS NOT CONTROLLED WITH YOUR NAUSEA MEDICATION *UNUSUAL SHORTNESS OF BREATH *UNUSUAL BRUISING OR BLEEDING *URINARY PROBLEMS (pain or burning when urinating, or frequent urination) *BOWEL PROBLEMS (unusual diarrhea, constipation, pain near the anus) TENDERNESS IN MOUTH AND THROAT WITH OR WITHOUT PRESENCE OF ULCERS (sore throat, sores in mouth, or a toothache) UNUSUAL RASH, SWELLING OR PAIN  UNUSUAL VAGINAL DISCHARGE OR ITCHING   Items with * indicate a potential emergency and should be followed up as soon as possible or go to the Emergency Department if any problems should occur.  Please show the CHEMOTHERAPY ALERT CARD or IMMUNOTHERAPY ALERT CARD at check-in to the  Emergency Department and triage nurse.  Should you have questions after your visit or need to cancel or reschedule your appointment, please contact [facility]  Dept: [phone]  and follow the prompts.  Office hours are 8:00 a.m. to 4:30 p.m. Monday - Friday. Please note that voicemails left after 4:00 p.m. may not be returned until the following business day.  We are closed weekends and major holidays. You have access to a nurse at all times for urgent questions. Please call the main number to the clinic Dept: [phone] and follow the prompts.   For any non-urgent questions, you may also contact your provider using MyChart. We now offer e-Visits for anyone 18 and older to request care online for non-urgent symptoms. For details visit [email or web address].   Also download the MyChart app! Go to the app store, search "MyChart", open the app, select [facility], and log in with your MyChart username and password.  Due to Covid, a mask is required upon entering the hospital/clinic. If you do not have a mask, one will be given to you upon arrival. For doctor visits, patients may have 1 support Misty Arnold aged 18 or older with them. For treatment visits, patients cannot have anyone with them due to current Covid guidelines and our immunocompromised population.  

## 2022-04-24 NOTE — Assessment & Plan Note (Addendum)
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  Treatment summary: 1.Right mastectomy 08/29/2015 at [facility] (Dr.Singh): IDC with invol of skin and skin ulceration, breast capsule inv cancer, superior medial margin positive, 1/1 LN positive, grade 3, 14.3 cm, 3.5 cm, ALI, chest wall involv, ER 90-100%, PR 80-90%, HER-2 negative, Ki 67 40-50% T4CN1 (St 3B) 2.Patient started tamoxifen but developed severe headache with severe hypertension and tamoxifen was discontinued 03/17/2016. 3.02/2017 started on Exemestane, Zoladex started 05/29/2017 4.Hospitalization for paraplegia: T9 vertebral body compression status post decompressive laminectomy and tumor resection 01/04/2020 5.Palliative radiation to the thoracic spine 01/28/2020 6.Ibrance with letrozole started April 2021discontinued 07/24/2021 7.Enhertu started 07/31/2021 every 3 weeks  ----------------------------------------------------------------------------------------------------------------- 05/11/2021: CT angiogram: Metastatic breast cancer soft tissue breast masses greater on the right, axillary supraclavicular lymphadenopathy, multiple bone metastases  Hospitalization 08/25/2021-09/05/2021: Worsening right chest wall pain and infection(patient has appointments with palliative care as well as infectious disease)  Current treatment: Enhertu started12/13/2022, today cycle10 Toxicities:Tolerating it extremely well without any major problems. CT CAP 01/30/2022: Regression of the large Rt breast mass and Axill LN, sclerotic bone mets, New lesion in liver 1.5 cm Chest wall tumors: Marked improvement  Wound care: She is able to manage with the help of home  health Repeat CT scans6/22/2023 has been ordered.

## 2022-04-25 ENCOUNTER — Inpatient Hospital Stay: Payer: Medicare Other

## 2022-04-25 ENCOUNTER — Inpatient Hospital Stay: Payer: Medicare Other | Admitting: Hematology and Oncology

## 2022-04-26 ENCOUNTER — Ambulatory Visit (HOSPITAL_COMMUNITY): Payer: Medicare Other

## 2022-05-02 ENCOUNTER — Other Ambulatory Visit (HOSPITAL_COMMUNITY): Payer: Self-pay

## 2022-05-09 ENCOUNTER — Encounter: Payer: Self-pay | Admitting: Hematology and Oncology

## 2022-05-14 MED FILL — Dexamethasone Sodium Phosphate Inj 100 MG/10ML: INTRAMUSCULAR | Qty: 1 | Status: AC

## 2022-05-15 ENCOUNTER — Inpatient Hospital Stay: Payer: Medicare PPO | Attending: Hematology and Oncology

## 2022-05-15 ENCOUNTER — Encounter: Payer: Self-pay | Admitting: Nurse Practitioner

## 2022-05-15 ENCOUNTER — Inpatient Hospital Stay: Payer: Medicare PPO

## 2022-05-15 ENCOUNTER — Other Ambulatory Visit: Payer: Self-pay

## 2022-05-15 ENCOUNTER — Inpatient Hospital Stay (HOSPITAL_BASED_OUTPATIENT_CLINIC_OR_DEPARTMENT_OTHER): Payer: Medicare PPO | Admitting: Hematology and Oncology

## 2022-05-15 ENCOUNTER — Inpatient Hospital Stay (HOSPITAL_BASED_OUTPATIENT_CLINIC_OR_DEPARTMENT_OTHER): Payer: Medicare PPO | Admitting: Nurse Practitioner

## 2022-05-15 DIAGNOSIS — Z170 Estrogen receptor positive status [ER+]: Secondary | ICD-10-CM

## 2022-05-15 DIAGNOSIS — C7949 Secondary malignant neoplasm of other parts of nervous system: Secondary | ICD-10-CM

## 2022-05-15 DIAGNOSIS — Z515 Encounter for palliative care: Secondary | ICD-10-CM | POA: Diagnosis not present

## 2022-05-15 DIAGNOSIS — Z5112 Encounter for antineoplastic immunotherapy: Secondary | ICD-10-CM | POA: Diagnosis not present

## 2022-05-15 DIAGNOSIS — C50919 Malignant neoplasm of unspecified site of unspecified female breast: Secondary | ICD-10-CM | POA: Diagnosis not present

## 2022-05-15 DIAGNOSIS — C50411 Malignant neoplasm of upper-outer quadrant of right female breast: Secondary | ICD-10-CM | POA: Insufficient documentation

## 2022-05-15 DIAGNOSIS — C7951 Secondary malignant neoplasm of bone: Secondary | ICD-10-CM | POA: Diagnosis not present

## 2022-05-15 DIAGNOSIS — Z79899 Other long term (current) drug therapy: Secondary | ICD-10-CM | POA: Diagnosis not present

## 2022-05-15 DIAGNOSIS — Z9011 Acquired absence of right breast and nipple: Secondary | ICD-10-CM | POA: Insufficient documentation

## 2022-05-15 DIAGNOSIS — R530 Neoplastic (malignant) related fatigue: Secondary | ICD-10-CM | POA: Diagnosis not present

## 2022-05-15 LAB — CBC WITH DIFFERENTIAL (CANCER CENTER ONLY)
Abs Immature Granulocytes: 0.02 10*3/uL (ref 0.00–0.07)
Basophils Absolute: 0 10*3/uL (ref 0.0–0.1)
Basophils Relative: 1 %
Eosinophils Absolute: 0.1 10*3/uL (ref 0.0–0.5)
Eosinophils Relative: 3 %
HCT: 42.9 % (ref 36.0–46.0)
Hemoglobin: 14.9 g/dL (ref 12.0–15.0)
Immature Granulocytes: 1 %
Lymphocytes Relative: 30 %
Lymphs Abs: 0.9 10*3/uL (ref 0.7–4.0)
MCH: 31.9 pg (ref 26.0–34.0)
MCHC: 34.7 g/dL (ref 30.0–36.0)
MCV: 91.9 fL (ref 80.0–100.0)
Monocytes Absolute: 0.4 10*3/uL (ref 0.1–1.0)
Monocytes Relative: 12 %
Neutro Abs: 1.6 10*3/uL — ABNORMAL LOW (ref 1.7–7.7)
Neutrophils Relative %: 53 %
Platelet Count: 297 10*3/uL (ref 150–400)
RBC: 4.67 MIL/uL (ref 3.87–5.11)
RDW: 13.6 % (ref 11.5–15.5)
WBC Count: 3.1 10*3/uL — ABNORMAL LOW (ref 4.0–10.5)
nRBC: 0 % (ref 0.0–0.2)

## 2022-05-15 LAB — CMP (CANCER CENTER ONLY)
ALT: 20 U/L (ref 0–44)
AST: 24 U/L (ref 15–41)
Albumin: 4.3 g/dL (ref 3.5–5.0)
Alkaline Phosphatase: 148 U/L — ABNORMAL HIGH (ref 38–126)
Anion gap: 6 (ref 5–15)
BUN: 18 mg/dL (ref 6–20)
CO2: 28 mmol/L (ref 22–32)
Calcium: 10 mg/dL (ref 8.9–10.3)
Chloride: 107 mmol/L (ref 98–111)
Creatinine: 0.89 mg/dL (ref 0.44–1.00)
GFR, Estimated: >60 mL/min (ref >60)
Glucose, Bld: 97 mg/dL (ref 70–99)
Potassium: 3.9 mmol/L (ref 3.5–5.1)
Sodium: 141 mmol/L (ref 135–145)
Total Bilirubin: 0.5 mg/dL (ref 0.3–1.2)
Total Protein: 7.4 g/dL (ref 6.5–8.1)

## 2022-05-15 IMAGING — CT CT HEAD W/O CM
4 series · 16 of 47 positions shown, 18 images · non-contrast
Comparison: Brain MRI 02/20/2018

CLINICAL DATA: Breast cancer

EXAM:
CT HEAD WITHOUT CONTRAST
TECHNIQUE: Contiguous axial images were obtained from the base of the skull
through the vertex without intravenous contrast.

[Series 2: head wo · axial · 0.44mm/px · z∈[-63,+57]mm · 7 of 34 slices shown, 9 images]
[im 5/34  brain]
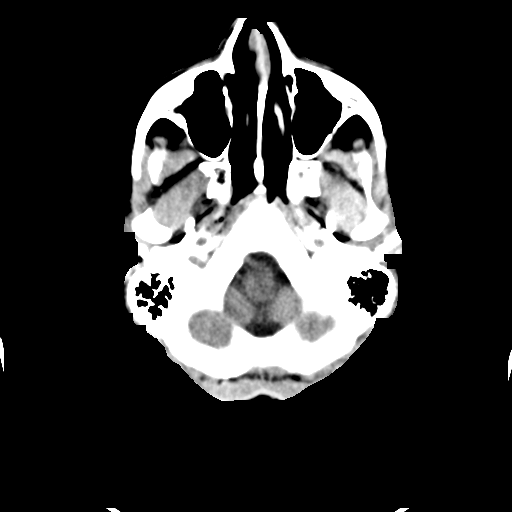
[im 5/34  bone]
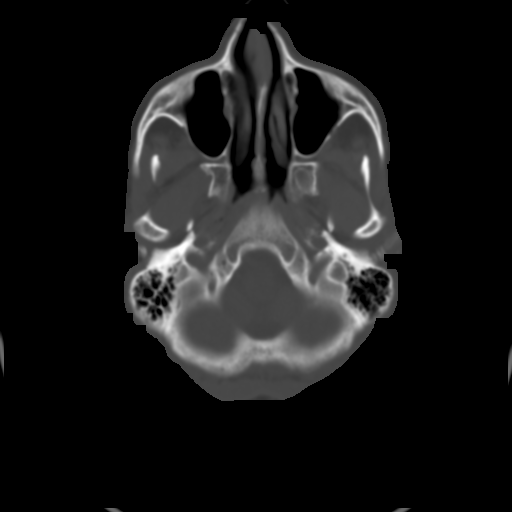
[im 9/34  brain]
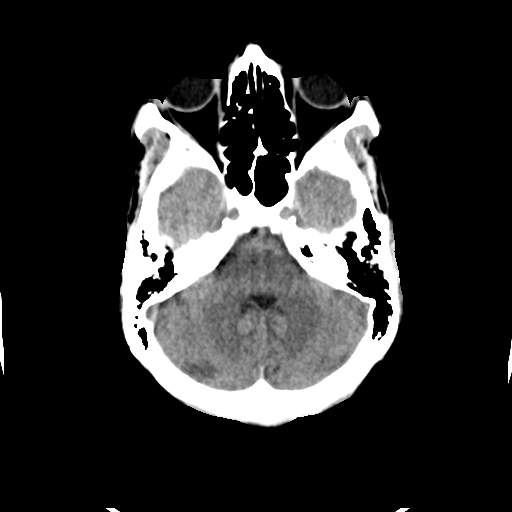
[im 13/34  brain]
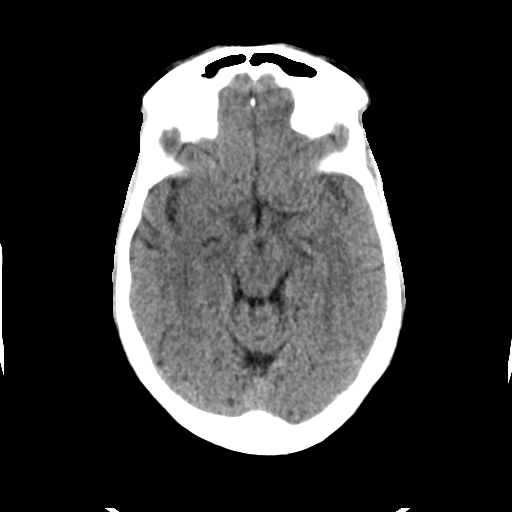
[im 17/34  brain]
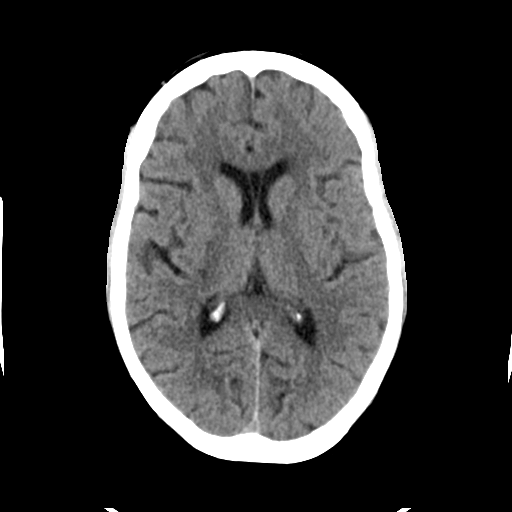
[im 21/34  brain]
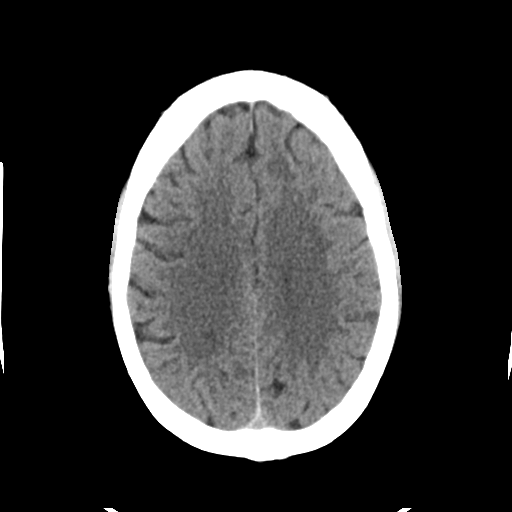
[im 21/34  bone]
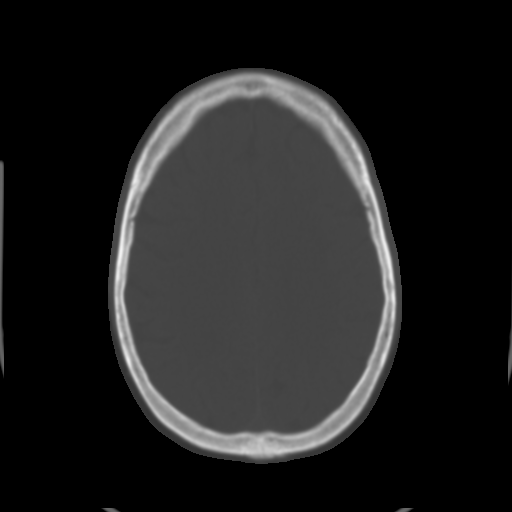
[im 25/34  brain]
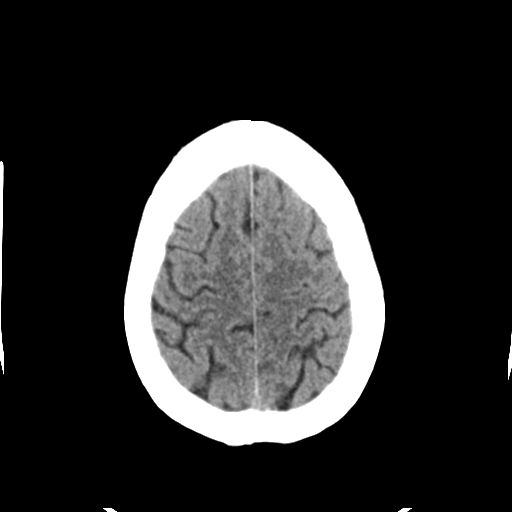
[im 29/34  brain]
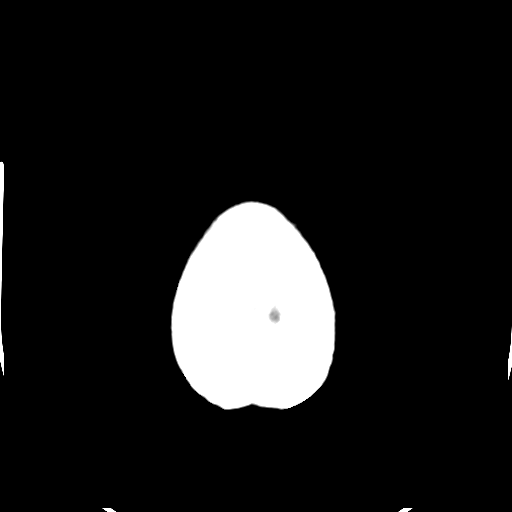

[Series 3: head bone · axial · 0.44mm/px · z∈[-67,-33]mm · 3 of 85 slices shown]
[im 9/85  bone]
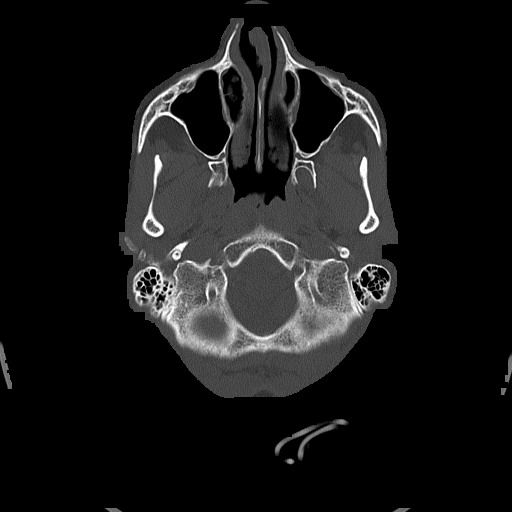
[im 17/85  bone]
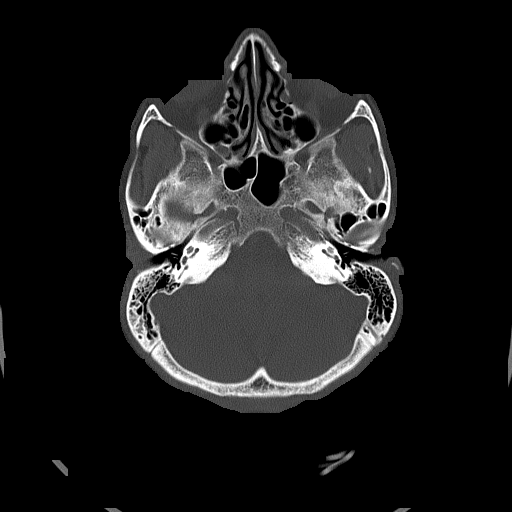
[im 26/85  bone]
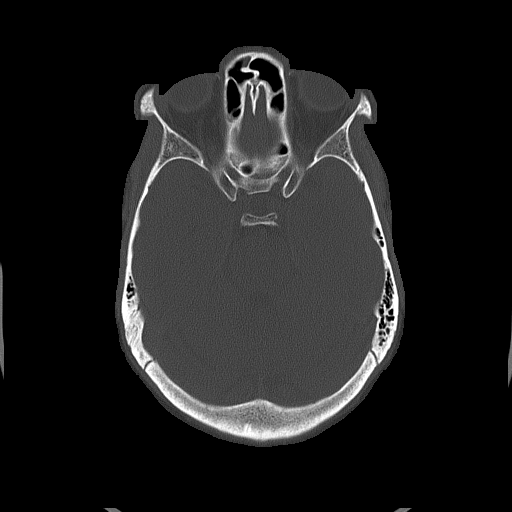

[Series 4: coronal soft tissue · coronal · 0.32mm/px · 3 of 72 slices shown]
[im 24/72  brain]
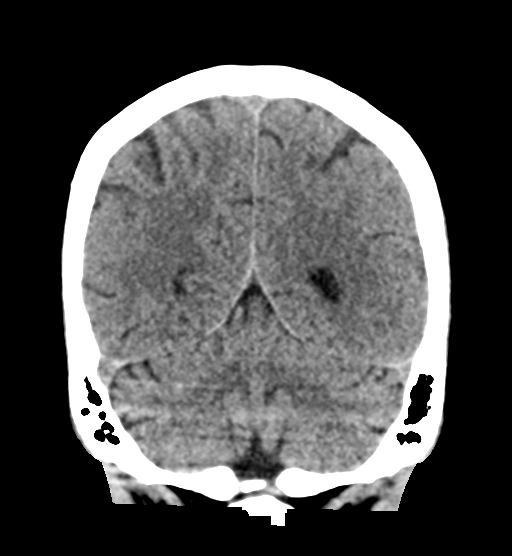
[im 32/72  brain]
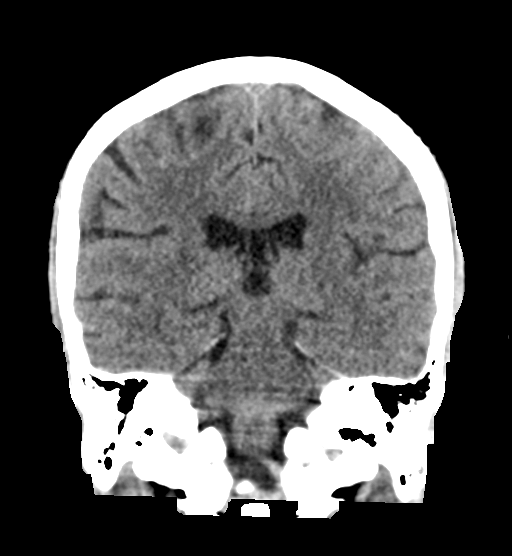
[im 40/72  brain]
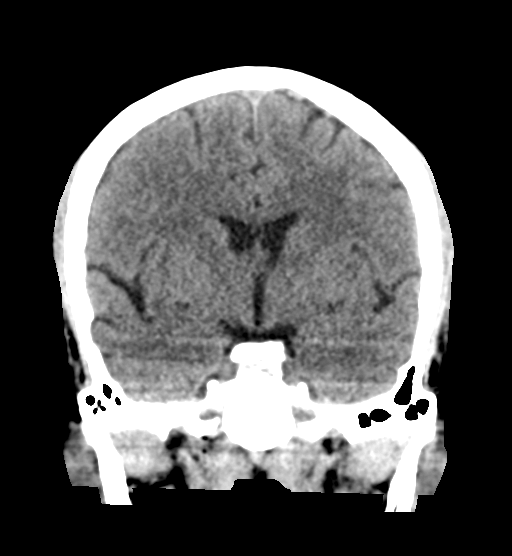

[Series 5: sagittal soft tissue · sagittal · 0.33mm/px · 3 of 57 slices shown]
[im 19/57  brain]
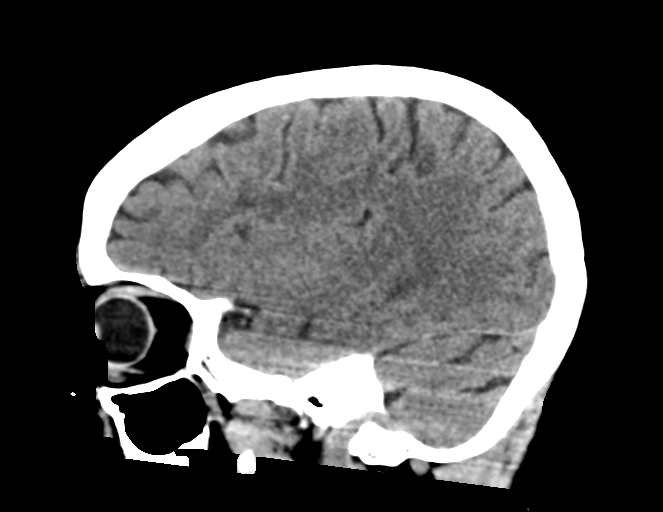
[im 29/57  brain]
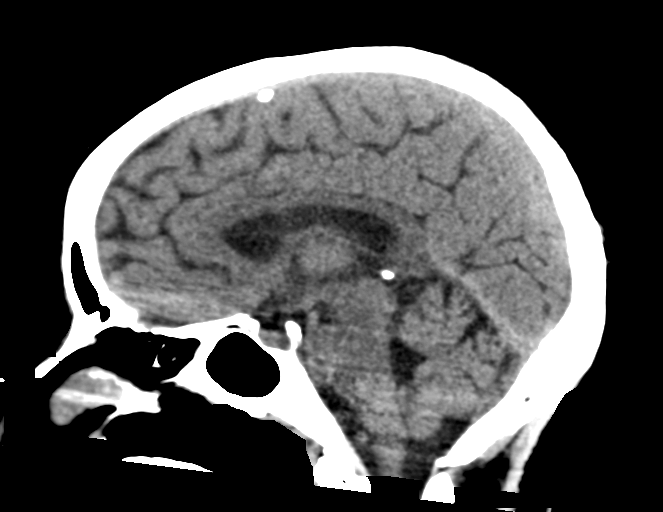
[im 38/57  brain]
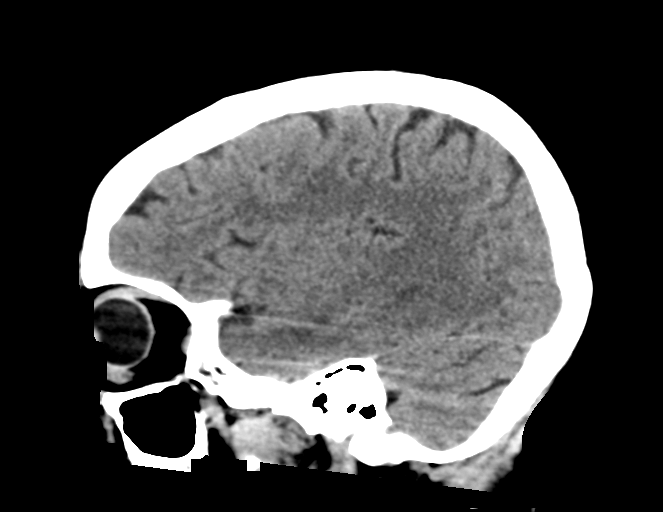

[16 of 47 positions shown; findings below may reference images not displayed]

FINDINGS: Brain: There is no evidence of acute intracranial hemorrhage,
extra-axial fluid collection, or acute infarct.

The parenchymal volume is normal. The ventricles are normal in size.

There is no evidence of solid mass lesion on this noncontrast CT.
There is no midline shift.

Vascular: No hyperdense vessel or unexpected calcification.

Skull: Normal. Negative for fracture or focal lesion.

Sinuses/Orbits: The imaged paranasal sinuses are clear. The globes
and orbits are unremarkable.

Other: None.
IMPRESSION: No evidence of intracranial metastatic disease on this noncontrast
head CT. Note that if there is clinical concern for metastatic
disease, MR of the head with and without contrast is recommended.

## 2022-05-15 IMAGING — CT CT CHEST W/ CM
2 of 4 series · 14 of 36 positions shown, 17 images · IV contrast (omnipaque)
Comparison: CT chest, abdomen and pelvis dated March 16, 2021

CLINICAL DATA: Large irregular multi

EXAM:
CT CHEST WITH CONTRAST
TECHNIQUE: Multidetector CT imaging of the chest was performed during
intravenous contrast administration.
CONTRAST:  60mL OMNIPAQUE IOHEXOL 350 MG/ML SOLN

[Series 2: axial st · axial · 0.83mm/px · z∈[-478,-232]mm · 11 of 147 slices shown, 14 images]
[im 12/147  mediastinal]
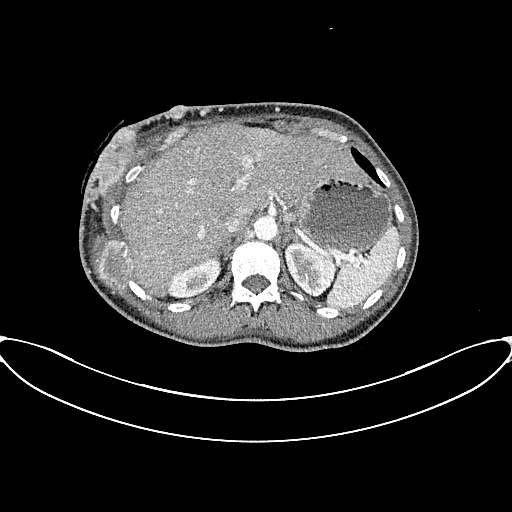
[im 12/147  lung]
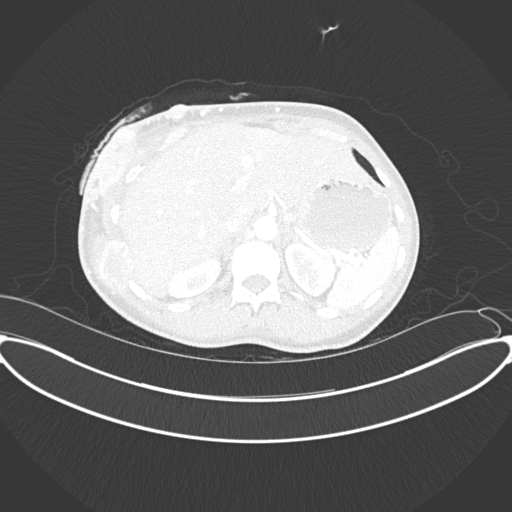
[im 23/147  lung]
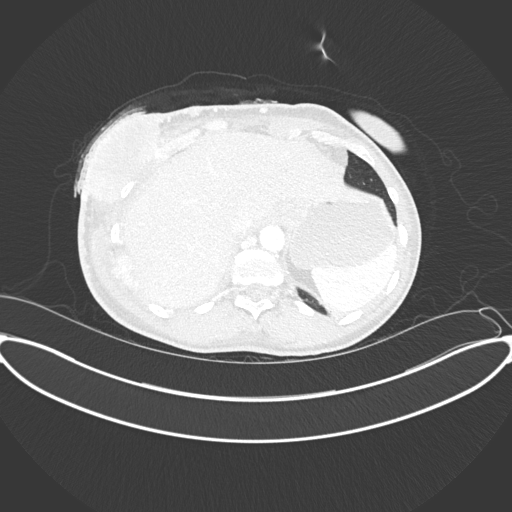
[im 34/147  lung]
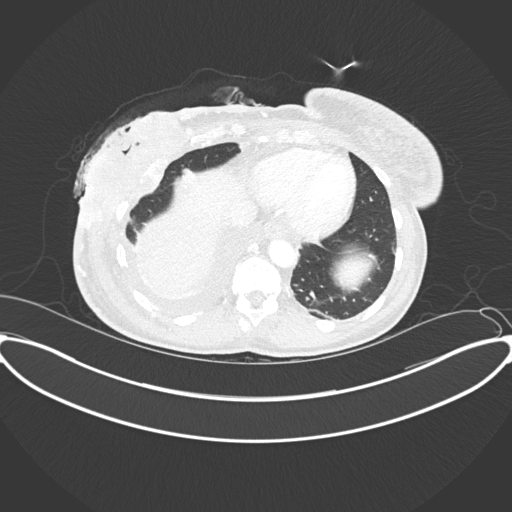
[im 45/147  lung]
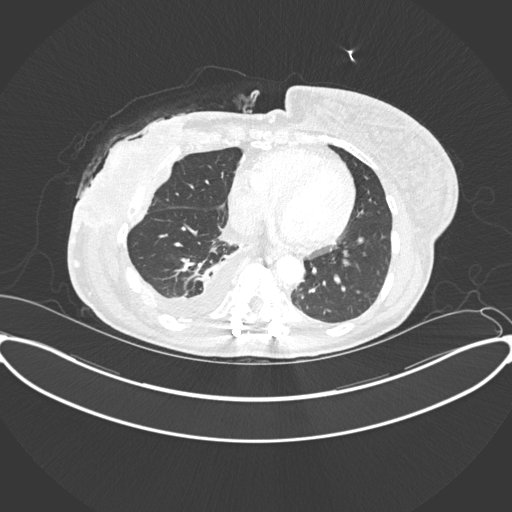
[im 57/147  mediastinal]
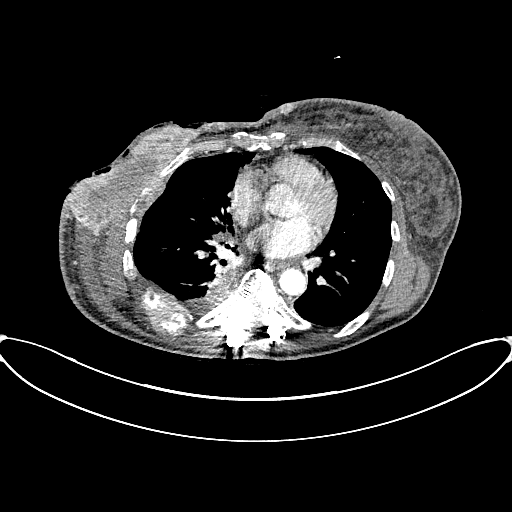
[im 57/147  lung]
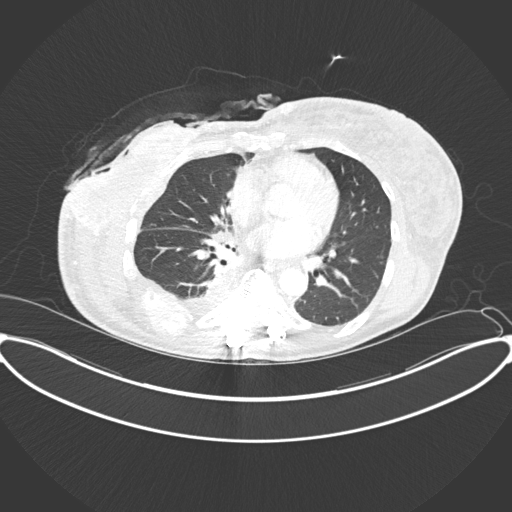
[im 79/147  lung]
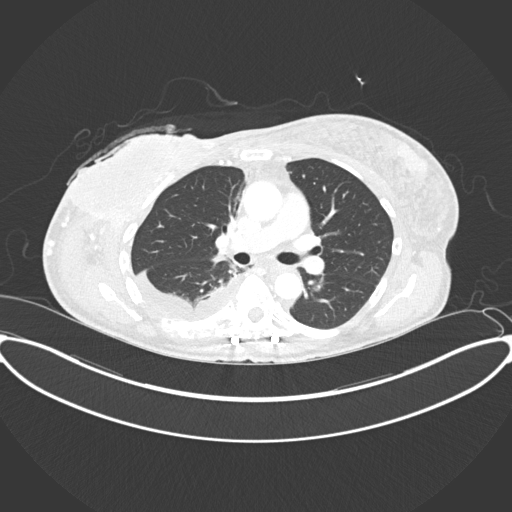
[im 90/147  lung]
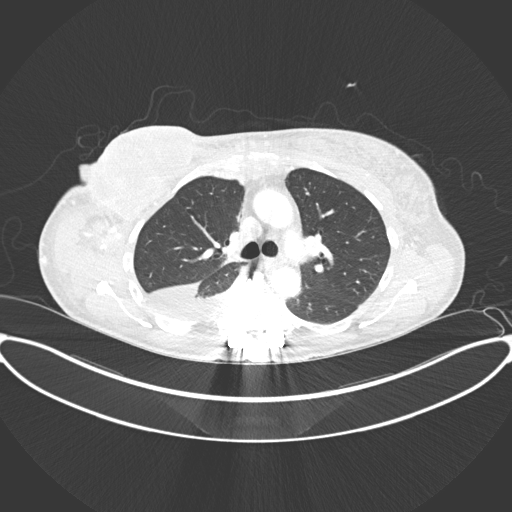
[im 102/147  lung]
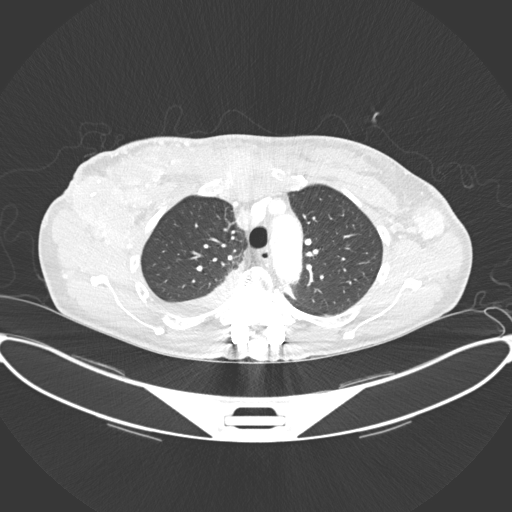
[im 113/147  mediastinal]
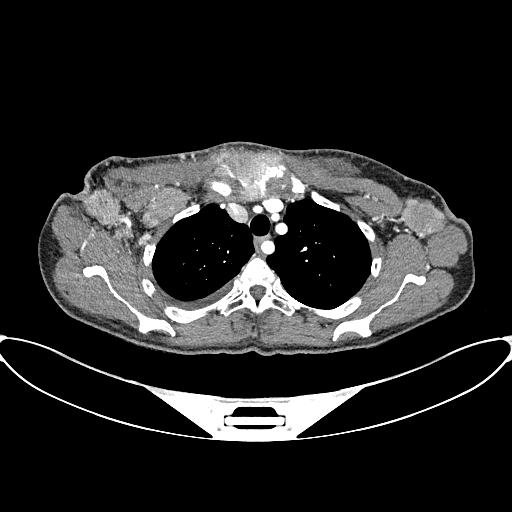
[im 113/147  lung]
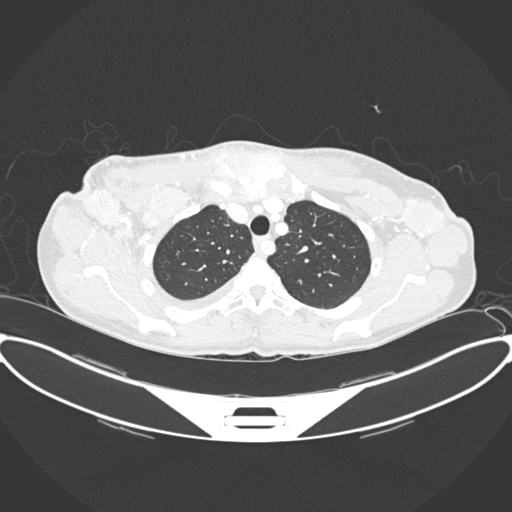
[im 124/147  lung]
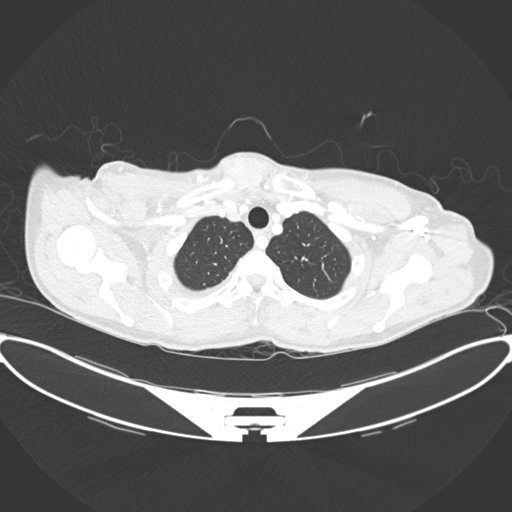
[im 135/147  lung]
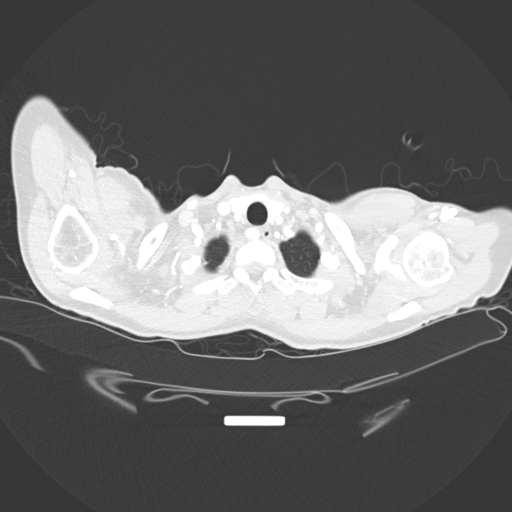

[Series 4: coronal (person_name) · coronal · 0.65mm/px · 3 of 138 slices shown]
[im 28/138  lung]
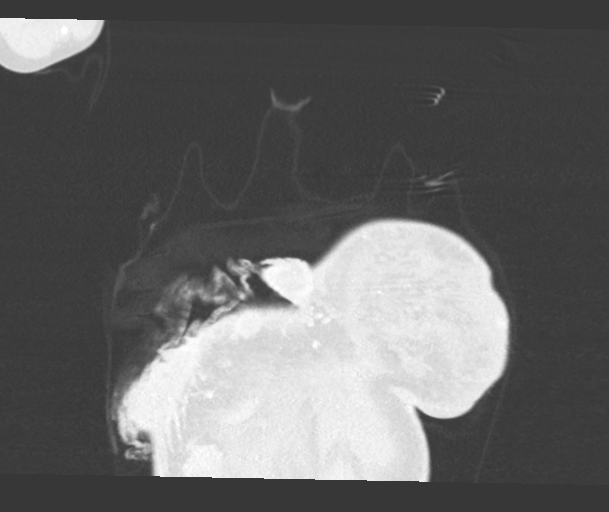
[im 55/138  lung]
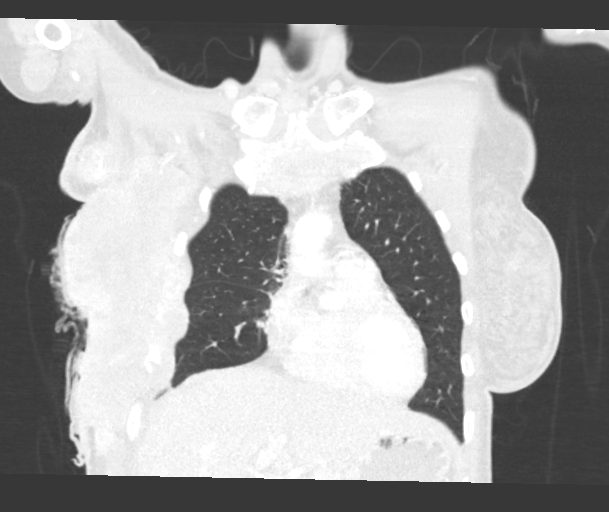
[im 83/138  lung]
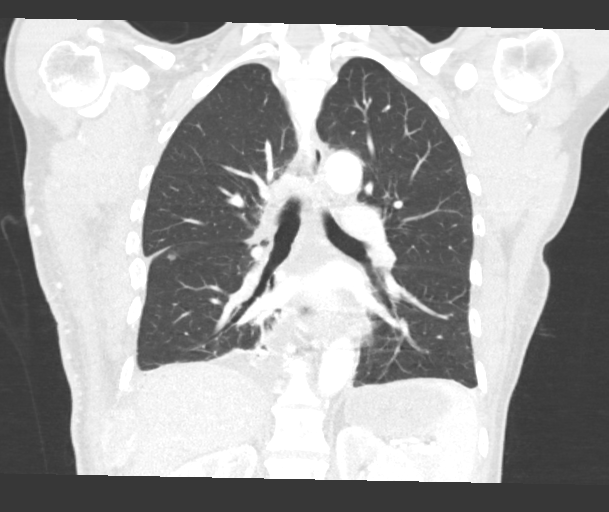

[14 of 36 positions shown; findings below may reference images not displayed]

FINDINGS: Cardiovascular: Normal heart size. Aberrant course of the right
subclavian artery. No pericardial effusion. No significant coronary
artery calcifications or atherosclerotic disease of the thoracic
aorta. No suspicious central filling defects of the pulmonary
arteries.

Mediastinum/Nodes: Esophagus and thyroid are unremarkable. Enlarged
bilateral axillary lymph nodes and right supraclavicular lymph
nodes, increased in size when compared with prior exam. Reference
left axillary lymph node measuring 4.0 by 2.7 cm on series 2, image
39, previously 3.6 x 1.6 cm.

Lungs/Pleura: Central airways are patent. Small loculated right
pleural effusion with associated atelectasis, new compared to prior
exam. No suspicious pulmonary nodules.

Upper Abdomen: Heterogeneous enhancing lesion of the left lobe of
the liver measuring approximately 3.1 x 1.8 cm on series 2, image
143, new compared to prior exam.

Musculoskeletal: Large irregular right chest wall mass, largest
component measures up to 10.1 by 5.9 cm, previously 9.3 x 5.0 cm.
New areas of ulceration are seen. New osseous destruction of the
adjacent 6th-1th ribs with invasion into the anterior right chest
wall. Expansile osseous right rib and sternal lesions with bulky
soft tissue components are increased in size compared to prior exam.
Reference lesion of the posterolateral ninth rib measuring 3.4 x
cm on series 2, image 135, previously measured 2.9 x 1.8 cm. Soft
tissue lesion of the left breast located on image 77 measuring 4.2 x
2.8 cm, previously 3.4 x 2.7 cm. Posterior fusion of T5-T10. Large
lytic lesion involving the posterior aspect of vertebral bodies of
T7-T9 is increased in size when compared to the prior exam.
IMPRESSION: 1. Large irregular right chest wall mass is increased in size when
compared with prior exam. There are new areas of ulceration and
increased extension into the anterior right chest wall and osseous
destruction of the anterior right 6th-1th ribs.
2. Expansile osseous right rib and sternal lesions with bulky soft
tissue components are increased in size compared to prior exam.
3. New loculated right pleural effusion.
4. Increased axillary and supraclavicular adenopathy and increased
size of left breast mass.
5. Posterior fusion of T5-T10. Large lytic lesion involving the
posterior aspect of vertebral bodies of T7-T9 is increased in size
when compared to the prior exam. Evaluation of the spinal canal is
limited due to streak artifact.
6. New heterogeneous lesion of the left hepatic lobe, compatible
with hepatic metastatic disease.

## 2022-05-15 MED ORDER — ACETAMINOPHEN 325 MG PO TABS
650.0000 mg | ORAL_TABLET | Freq: Once | ORAL | Status: AC
  Administered 2022-05-15: 650 mg via ORAL
  Filled 2022-05-15: qty 2

## 2022-05-15 MED ORDER — DIPHENHYDRAMINE HCL 25 MG PO CAPS
25.0000 mg | ORAL_CAPSULE | Freq: Once | ORAL | Status: AC
  Administered 2022-05-15: 25 mg via ORAL
  Filled 2022-05-15: qty 1

## 2022-05-15 MED ORDER — FAM-TRASTUZUMAB DERUXTECAN-NXKI CHEMO 100 MG IV SOLR
360.0000 mg | Freq: Once | INTRAVENOUS | Status: AC
  Administered 2022-05-15: 360 mg via INTRAVENOUS
  Filled 2022-05-15: qty 18

## 2022-05-15 MED ORDER — SODIUM CHLORIDE 0.9 % IV SOLN
10.0000 mg | Freq: Once | INTRAVENOUS | Status: AC
  Administered 2022-05-15: 10 mg via INTRAVENOUS
  Filled 2022-05-15: qty 10

## 2022-05-15 MED ORDER — PALONOSETRON HCL INJECTION 0.25 MG/5ML
0.2500 mg | Freq: Once | INTRAVENOUS | Status: AC
  Administered 2022-05-15: 0.25 mg via INTRAVENOUS
  Filled 2022-05-15: qty 5

## 2022-05-15 MED ORDER — DEXTROSE 5 % IV SOLN: Freq: Once | INTRAVENOUS | Status: AC

## 2022-05-15 NOTE — Patient Instructions (Signed)
[facility]  Discharge Instructions: Thank you for choosing [facility] to provide your oncology and hematology care.   If you have a lab appointment with the [facility], please go directly to the [facility] and check in at the registration area.   Wear comfortable clothing and clothing appropriate for easy access to any Portacath or PICC line.   We strive to give you quality time with your provider. You may need to reschedule your appointment if you arrive late (15 or more minutes).  Arriving late affects you and other patients whose appointments are after yours.  Also, if you miss three or more appointments without notifying the office, you may be dismissed from the clinic at the provider's discretion.      For prescription refill requests, have your pharmacy contact our office and allow 72 hours for refills to be completed.    Today you received the following chemotherapy and/or immunotherapy agent: Enhertu      To help prevent nausea and vomiting after your treatment, we encourage you to take your nausea medication as directed.  BELOW ARE SYMPTOMS THAT SHOULD BE REPORTED IMMEDIATELY: *FEVER GREATER THAN 100.4 F (38 C) OR HIGHER *CHILLS OR SWEATING *NAUSEA AND VOMITING THAT IS NOT CONTROLLED WITH YOUR NAUSEA MEDICATION *UNUSUAL SHORTNESS OF BREATH *UNUSUAL BRUISING OR BLEEDING *URINARY PROBLEMS (pain or burning when urinating, or frequent urination) *BOWEL PROBLEMS (unusual diarrhea, constipation, pain near the anus) TENDERNESS IN MOUTH AND THROAT WITH OR WITHOUT PRESENCE OF ULCERS (sore throat, sores in mouth, or a toothache) UNUSUAL RASH, SWELLING OR PAIN  UNUSUAL VAGINAL DISCHARGE OR ITCHING   Items with * indicate a potential emergency and should be followed up as soon as possible or go to the Emergency Department if any problems should occur.  Please show the CHEMOTHERAPY ALERT CARD or IMMUNOTHERAPY ALERT CARD at check-in to the  Emergency Department and triage nurse.  Should you have questions after your visit or need to cancel or reschedule your appointment, please contact [facility]  Dept: [phone]  and follow the prompts.  Office hours are 8:00 a.m. to 4:30 p.m. Monday - Friday. Please note that voicemails left after 4:00 p.m. may not be returned until the following business day.  We are closed weekends and major holidays. You have access to a nurse at all times for urgent questions. Please call the main number to the clinic Dept: [phone] and follow the prompts.   For any non-urgent questions, you may also contact your provider using MyChart. We now offer e-Visits for anyone 34 and older to request care online for non-urgent symptoms. For details visit [email or web address].   Also download the MyChart app! Go to the app store, search "MyChart", open the app, select [facility], and log in with your MyChart username and password.  Due to Covid, a mask is required upon entering the hospital/clinic. If you do not have a mask, one will be given to you upon arrival. For doctor visits, patients may have 1 support [place] aged 108 or older with them. For treatment visits, patients cannot have anyone with them due to current Covid guidelines and our immunocompromised population.

## 2022-05-15 NOTE — Progress Notes (Signed)
[facility]  [phone] [phone]   Name: Misty Arnold Date: 05/15/2022 MRN: [id number]  DOB: 09-Jan-1971  Patient Care Team: Nicholas Lose, MD as PCP - General (Hematology and Oncology)   INTERVAL HISTORY: Misty Arnold is a 52 y.o. female with right metastatic breast cancer s/p mastectomy, large right chest wound secondary to necrotic mass, weankess due to T9 vertebral compression s/p tumor resection and decompressive laminectomy, and hypertension. Patient did not receive treatment for some time after seeking care in [place]. Currently receiving Enhertu treatment. Palliative following for symptom management and goals of care.   SOCIAL HISTORY:     reports that she has never smoked. She has never used smokeless tobacco. She reports that she does not currently use alcohol. She reports that she does not use drugs.  ADVANCE DIRECTIVES:  Advance directives completed during recent hospitalization 08/27/2021.  Documents are on file and have been reviewed personally and [name].  CODE STATUS: Full code  PAST MEDICAL HISTORY: Past Medical History:  Diagnosis Date   Abnormal uterine bleeding    Anemia    Breast cancer ([facility])    Depression    Fibroid    Hormone disorder    Hypertension    Infertility, female    ALLERGIES:  is allergic to amoxicillin, penicillins, pork-derived products, penicillin g, and tamoxifen.  MEDICATIONS:  Current Outpatient Medications  Medication Sig Dispense Refill   acetaminophen (TYLENOL) 500 MG tablet Take 2 tablets (1,000 mg total) by mouth every 6 (six) hours as needed for fever.     Ascorbic Acid (VITAMIN C PO) Take 1,000 mg by mouth daily.     atenolol (TENORMIN) 50 MG tablet Take 1 tablet (50 mg total) by mouth 2 (two) times daily. 180 tablet 3   celecoxib (CELEBREX) 200 MG capsule Take 1 capsule (200 mg total) by mouth at bedtime. 30 capsule 3   Cholecalciferol (VITAMIN D3) 125 MCG  (5000 UT) CAPS Take 1 capsule (5,000 Units total) by mouth daily. 30 capsule 3   diazepam (VALIUM) 5 MG tablet Take 1 tablet (5 mg total) by mouth every 12 (twelve) hours as needed for anxiety or muscle spasms (sleep). 60 tablet 0   enoxaparin (LOVENOX) 40 MG/0.4ML injection Inject 0.4 mLs (40 mg total) into the skin daily. 24 mL 0   lip balm (CARMEX) ointment Apply 1 application topically as needed for lip care (cold sores). 7 g 0   megestrol (MEGACE ES) 625 MG/5ML suspension Take 5 mLs (625 mg total) by mouth daily. 150 mL 3   metroNIDAZOLE (METROGEL) 0.75 % gel Apply topically 2 (two) times daily. 45 g 0   morphine (MSIR) 30 MG tablet Take 1 tablet (30 mg total) by mouth every 4 (four) hours as needed for severe pain. 120 tablet 0   Multiple Vitamins-Minerals (MULTIVITAMIN WITH MINERALS) tablet FLORADIX Supplement-Iron +minerals     Nutritional Supplements (,FEEDING SUPPLEMENT, PROSOURCE PLUS) liquid Take 30 mLs by mouth 4 (four) times daily - after meals and at bedtime.     ondansetron (ZOFRAN) 8 MG tablet Take by mouth.     oxyCODONE ER (XTAMPZA ER) 9 MG C12A Take 1 capsule by mouth 2 times daily. 60 capsule 0   polyethylene glycol (MIRALAX / GLYCOLAX) 17 g packet Take 17 g by mouth daily. 30 each 0   potassium chloride SA (KLOR-CON M) 20 MEQ tablet Take 1 tablet (20 mEq total) by mouth daily. 30 tablet 0   pregabalin (LYRICA) 50  MG capsule Take 1 capsule (50 mg total) by mouth at bedtime. 30 capsule 6   Silver Hydrogel GEL Apply to wound bed as directed with each dressing change. 44.4 mL 0   No current facility-administered medications for this visit.    VITAL SIGNS: There were no vitals taken for this visit. There were no vitals filed for this visit.  Estimated body mass index is 23.88 kg/m as calculated from the following:   Height as of an earlier encounter on 05/15/22: '5\' 5"'$  (1.651 m).   Weight as of an earlier encounter on 05/15/22: 65.1 kg.  PERFORMANCE STATUS (ECOG) : 0 -  Asymptomatic  Physical Exam NAD, ambulatory, well developed RRR Right breast wound healing, no drainage, borders now closed AAO x3, mood appropriate   IMPRESSION:  I saw Ms. Marcelli during her infusion. No acute distress. Continues to show improvement/stability which she is appreciative of. Tolerating Enhertu treatments.   No symptom management needs at this time.   Denies pain, constipation, diarrhea, nausea, or vomiting.    Patient knows to contact our office as needed. We will continue to follow and be available. I am glad she is much improved.      PLAN: Much improved. Pain is resolved. Occasional Tylenol use for minor aches and headaches. Appetite much improved. Weight increasing.  Right breast wound healing.  I will plan to see her back in 6-8 weeks.  Patient expressed understanding and was in agreement with this plan. She also understands that She can call the clinic at any time with any questions, concerns, or complaints.      Time Total: 20 min   Visit consisted of counseling and education dealing with the complex and emotionally intense issues of symptom management and palliative care in the setting of serious and potentially life-threatening illness.Greater than 50%  of this time was spent counseling and coordinating care related to the above assessment and plan.  [name], AGPCNP-BC  Palliative Medicine Team/[facility]

## 2022-05-15 NOTE — Progress Notes (Signed)
Patient Care Team: Nicholas Lose, MD as PCP - General (Hematology and Oncology)  DIAGNOSIS:  Encounter Diagnosis  Name Primary?   Malignant neoplasm of upper-outer quadrant of right breast in female, estrogen receptor positive ([place])     SUMMARY OF ONCOLOGIC HISTORY: Oncology History  Breast cancer of upper-outer quadrant of right female breast ([place])  09/21/2010 Mammogram   right breast linear and segmental pleomorphic calcifications from 4:00 to 6:00 position ultrasound revealed 1.6 cm and 1.1 cm masses   09/27/2010 Initial Biopsy   ultrasound-guided biopsy of all masses showed DCIS grade 2; patient went to cancer treatment centers of [place] for second opinion and delayed therapy   01/26/2011 Surgery   right mastectomy followed by reconstruction: 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 But she could not afford these trips.   01/08/2014 Breast MRI   right breast multiple enhancing masses within the soft tissues largest 5.6 cm in wall skin surface and the capsule of the silicone prosthesis, contiguous nodules involving in inferomedial breast and tired and subcentimeter nodules across the midline   08/29/2015 Surgery   Right mastectomy: IDC with invol of skin and skin ulceration, breast capsule inv cancer, superior medial margin positive, 1/1 LN positive, grade 3, 14.3 cm, 3.5 cm, ALI, chest wall involv, ER 90-100%, PR 80-90%, HER-2 negative, Ki 67 40-50% T4CN1 (St 3B)   03/05/2016 -  Anti-estrogen oral therapy   Tamoxifen 20 mg daily stopped due to headache  and uncontrolled hypertension. Decrease to 10 mg daily 04/03/2016, stopped June 2017 and took an estrogen metabolizer over-the-counter; started Aromasin April 2018 from a [place] physician, Zoladex started on 05/2017 and switched to Letrozole in 09/2017   01/04/2020 Surgery   Patient presented to the ED on 01/04/20 for worsening lower extremity numbness and underwent a decompressive laminectomy with tumor resection at T9 that was complicated with acute blood loss anemia and leukocytosis.   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)  01/08/2020 Initial Diagnosis   Metastatic breast cancer ([place])   10/17/2021 -  Chemotherapy   Patient is on Treatment Plan : BREAST METASTATIC fam-trastuzumab deruxtecan-nxki (Enhertu) q21d       CHIEF COMPLIANT: Follow up on Enhertu  INTERVAL HISTORY: Misty Arnold is a 51 y.o. with above-mentioned history of metastatic breast cancer currently on chemotherapy with Enhertu. She presents to the clinic today for follow-up. She states that her treatment went well.  She continues to see  clinical improvement in the tumors with decreased bleeding and pain and discomfort.   ALLERGIES:  is allergic to amoxicillin, penicillins, pork-derived products, penicillin g, and tamoxifen.  MEDICATIONS:  Current Outpatient  Medications  Medication Sig Dispense Refill   acetaminophen (TYLENOL) 500 MG tablet Take 2 tablets (1,000 mg total) by mouth every 6 (six) hours as needed for fever.     Ascorbic Acid (VITAMIN C PO) Take 1,000 mg by mouth daily.     atenolol (TENORMIN) 50 MG tablet Take 1 tablet (50 mg total) by mouth 2 (two) times daily. 180 tablet 3   celecoxib (CELEBREX) 200 MG capsule Take 1 capsule (200 mg total) by mouth at bedtime. 30 capsule 3   Cholecalciferol (VITAMIN D3) 125 MCG (5000 UT) CAPS Take 1 capsule (5,000 Units total) by mouth daily. 30 capsule 3   diazepam (VALIUM) 5 MG tablet Take 1 tablet (5 mg total) by mouth every 12 (twelve) hours as needed for anxiety or muscle spasms (sleep). 60 tablet 0   enoxaparin (LOVENOX) 40 MG/0.4ML injection Inject 0.4 mLs (40 mg total) into the skin daily. 24 mL 0   lip balm (CARMEX) ointment Apply 1 application topically as needed for lip care (cold sores). 7 g 0   megestrol (MEGACE ES) 625 MG/5ML suspension Take 5 mLs (625 mg total) by mouth daily. 150 mL 3   metroNIDAZOLE (METROGEL) 0.75 % gel Apply topically 2 (two) times daily. 45 g 0   morphine (MSIR) 30 MG tablet Take 1 tablet (30 mg total) by mouth every 4 (four) hours as needed for severe pain. 120 tablet 0   Multiple Vitamins-Minerals (MULTIVITAMIN WITH MINERALS) tablet FLORADIX Supplement-Iron +minerals     Nutritional Supplements (,FEEDING SUPPLEMENT, PROSOURCE PLUS) liquid Take 30 mLs by mouth 4 (four) times daily - after meals and at bedtime.     ondansetron (ZOFRAN) 8 MG tablet Take by mouth.     oxyCODONE ER (XTAMPZA ER) 9 MG C12A Take 1 capsule by mouth 2 times daily. 60 capsule 0   polyethylene glycol (MIRALAX / GLYCOLAX) 17 g packet Take 17 g by mouth daily. 30 each 0   potassium chloride SA (KLOR-CON M) 20 MEQ tablet Take 1 tablet (20 mEq total) by mouth daily. 30 tablet 0   pregabalin (LYRICA) 50 MG capsule Take 1 capsule (50 mg total) by mouth at bedtime. 30 capsule 6   Silver Hydrogel  GEL Apply to wound bed as directed with each dressing change. 44.4 mL 0   No current facility-administered medications for this visit.    PHYSICAL EXAMINATION: ECOG PERFORMANCE STATUS: 1 - Symptomatic but completely ambulatory  Vitals:   05/15/22 0927  BP: (!) 148/98  Pulse: 76  Resp: 18  Temp: (!) 97.5 F (36.4 C)  SpO2: 100%   Filed Weights   05/15/22 0927  Weight: 143 lb 8 oz (65.1 kg)      LABORATORY DATA:  I have reviewed the data as listed    Latest Ref Rng & Units 05/15/2022    8:59 AM 04/24/2022    7:59 AM 04/03/2022    8:01 AM  CMP  Glucose 70 - 99 mg/dL 97  108  114   BUN 6 - 20 mg/dL 18  14  10    Creatinine 0.44 - 1.00 mg/dL 0.89  0.88  0.81   Sodium 135 - 145 mmol/L 141  142  142   Potassium 3.5 - 5.1 mmol/L 3.9  3.5  3.4   Chloride 98 - 111  mmol/L 107  107  107   CO2 22 - 32 mmol/L 28  28  27    Calcium 8.9 - 10.3 mg/dL 10.0  9.8  9.6   Total Protein 6.5 - 8.1 g/dL 7.4  7.1  6.6   Total Bilirubin 0.3 - 1.2 mg/dL 0.5  0.3  0.5   Alkaline Phos 38 - 126 U/L 148  144  151   AST 15 - 41 U/L 24  26  24    ALT 0 - 44 U/L 20  24  23      Lab Results  Component Value Date   WBC 3.1 (L) 05/15/2022   HGB 14.9 05/15/2022   HCT 42.9 05/15/2022   MCV 91.9 05/15/2022   PLT 297 05/15/2022   NEUTROABS 1.6 (L) 05/15/2022    ASSESSMENT & PLAN:  Breast cancer of upper-outer quadrant of right female breast ([place]) Recurrent right breast cancer initially DCIS treated with mastectomy followed by reconstruction and later developed chest wall recurrence in 2013 ER/PR positive HER-2 negative, patient underwent alternative therapies in [place] but could not afford the trips and hence she has not been on any breast cancer therapy for a long time.   Treatment summary: 1. Right mastectomy 08/29/2015 at [facility] (Dr.Singh): IDC with invol of skin and skin ulceration, breast capsule inv cancer, superior medial margin positive, 1/1 LN positive, grade 3, 14.3 cm, 3.5 cm, ALI, chest wall  involv, ER 90-100%, PR 80-90%, HER-2 negative, Ki 67 40-50% T4CN1 (St 3B) 2. Patient started tamoxifen but developed severe headache with severe hypertension and tamoxifen was discontinued 03/17/2016.  3. 02/2017 started on Exemestane, Zoladex started 05/29/2017 4.  Hospitalization for paraplegia: T9 vertebral body compression status post decompressive laminectomy and tumor resection 01/04/2020 5.  Palliative radiation to the thoracic spine 01/28/2020 6.  Ibrance with letrozole started April 2021 discontinued 07/24/2021 7.  Enhertu started 07/31/2021 every 3 weeks  ----------------------------------------------------------------------------------------------------------------- 05/11/2021: CT angiogram: Metastatic breast cancer soft tissue breast masses greater on the right, axillary supraclavicular lymphadenopathy, multiple bone metastases    Hospitalization 08/25/2021-09/05/2021: Worsening right chest wall pain and infection (patient has appointments with palliative care as well as infectious disease)   Current treatment: Enhertu started 10/17/2021, today cycle 11 Toxicities: Tolerating it extremely well without any major problems. CT CAP 01/30/2022: Regression of the large Rt breast mass and Axill LN, sclerotic bone mets, New lesion in liver 1.5 cm Chest wall tumors: Marked improvement   Wound care: She is able to manage with the help of home health CT CAP 04/25/2022: Infiltrating tumor right chest wall decrease in size, decreased bilateral axillary lymph nodes, decrease in size of liver metastases, increase sclerosis of bony lesions suggestive of healing  Radiology review: Based on excellent response to treatment we will continue with the same.   Return to clinic in 3 weeks for cycle 12    No orders of the defined types were placed in this encounter.  The patient has a good understanding of the overall plan. she agrees with it. she will call with any problems that may develop before the next visit  here. Total time spent: 30 mins including face to face time and time spent for planning, charting and co-ordination of care   Harriette Ohara, MD 05/15/22    I Gardiner Coins am scribing for Dr. Lindi Adie  I have reviewed the above documentation for accuracy and completeness, and I agree with the above.

## 2022-05-15 NOTE — Assessment & Plan Note (Signed)
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  Treatment summary: 1.Right mastectomy 08/29/2015 at [facility] (Dr.Singh): IDC with invol of skin and skin ulceration, breast capsule inv cancer, superior medial margin positive, 1/1 LN positive, grade 3, 14.3 cm, 3.5 cm, ALI, chest wall involv, ER 90-100%, PR 80-90%, HER-2 negative, Ki 67 40-50% T4CN1 (St 3B) 2.Patient started tamoxifen but developed severe headache with severe hypertension and tamoxifen was discontinued 03/17/2016. 3.02/2017 started on Exemestane, Zoladex started 05/29/2017 4.Hospitalization for paraplegia: T9 vertebral body compression status post decompressive laminectomy and tumor resection 01/04/2020 5.Palliative radiation to the thoracic spine 01/28/2020 6.Ibrance with letrozole started April 2021discontinued 07/24/2021 7.Enhertu started 07/31/2021 every 3 weeks  ----------------------------------------------------------------------------------------------------------------- 05/11/2021: CT angiogram: Metastatic breast cancer soft tissue breast masses greater on the right, axillary supraclavicular lymphadenopathy, multiple bone metastases  Hospitalization 08/25/2021-09/05/2021: Worsening right chest wall pain and infection(patient has appointments with palliative care as well as infectious disease)  Current treatment: Enhertu started12/13/2022, today cycle11 Toxicities:Tolerating it extremely well without any major problems. CT CAP 01/30/2022: Regression of the large Rt breast mass and Axill LN, sclerotic bone mets, New lesion in liver 1.5 cm Chest wall tumors: Marked improvement  Wound care:She is able to manage with the help of home  health CT CAP 04/25/2022: Infiltrating tumor right chest wall decrease in size, decreased bilateral axillary lymph nodes, decrease in size of liver metastases, increase sclerosis of bony lesions suggestive of healing  Radiology review: Based on excellent response to treatment we will continue with the same.  Return to clinic in 3 weeks for cycle 12 

## 2022-05-16 ENCOUNTER — Inpatient Hospital Stay: Payer: Medicare PPO

## 2022-05-16 ENCOUNTER — Inpatient Hospital Stay: Payer: Medicare PPO | Admitting: Adult Health

## 2022-05-16 ENCOUNTER — Telehealth: Payer: Self-pay | Admitting: Hematology and Oncology

## 2022-05-16 NOTE — Telephone Encounter (Signed)
Scheduled appointment per 7/11 los. Unable to leave a voicemail due to mailbox being full. Patient will be mailed an updated calendar.

## 2022-06-04 MED FILL — Dexamethasone Sodium Phosphate Inj 100 MG/10ML: INTRAMUSCULAR | Qty: 1 | Status: AC

## 2022-06-05 ENCOUNTER — Other Ambulatory Visit: Payer: Medicare PPO

## 2022-06-05 ENCOUNTER — Telehealth: Payer: Self-pay | Admitting: *Deleted

## 2022-06-05 ENCOUNTER — Inpatient Hospital Stay: Payer: Medicare PPO

## 2022-06-05 ENCOUNTER — Ambulatory Visit: Payer: Medicare PPO

## 2022-06-05 ENCOUNTER — Telehealth: Payer: Self-pay | Admitting: Hematology and Oncology

## 2022-06-05 NOTE — Telephone Encounter (Signed)
Per 8/1 phone line pt called to r/s infusion appointment.  R/s per pt ok per charge

## 2022-06-05 NOTE — Telephone Encounter (Signed)
RN attempt x1 to contact pt regarding missed appts today.  No answer, unable to LVM due to VM being full.

## 2022-06-06 ENCOUNTER — Inpatient Hospital Stay: Payer: Medicare PPO | Attending: Hematology and Oncology

## 2022-06-06 ENCOUNTER — Inpatient Hospital Stay: Payer: Medicare PPO

## 2022-06-06 ENCOUNTER — Other Ambulatory Visit (HOSPITAL_COMMUNITY): Payer: Self-pay

## 2022-06-06 ENCOUNTER — Other Ambulatory Visit: Payer: Self-pay

## 2022-06-06 ENCOUNTER — Other Ambulatory Visit: Payer: Self-pay | Admitting: *Deleted

## 2022-06-06 ENCOUNTER — Encounter: Payer: Self-pay | Admitting: Hematology and Oncology

## 2022-06-06 VITALS — BP 162/89 | HR 85 | Temp 98.0°F | Resp 18 | Ht 65.0 in | Wt 141.5 lb

## 2022-06-06 DIAGNOSIS — R0789 Other chest pain: Secondary | ICD-10-CM | POA: Diagnosis not present

## 2022-06-06 DIAGNOSIS — C7949 Secondary malignant neoplasm of other parts of nervous system: Secondary | ICD-10-CM

## 2022-06-06 DIAGNOSIS — C50411 Malignant neoplasm of upper-outer quadrant of right female breast: Secondary | ICD-10-CM | POA: Insufficient documentation

## 2022-06-06 DIAGNOSIS — C787 Secondary malignant neoplasm of liver and intrahepatic bile duct: Secondary | ICD-10-CM | POA: Diagnosis not present

## 2022-06-06 DIAGNOSIS — C50919 Malignant neoplasm of unspecified site of unspecified female breast: Secondary | ICD-10-CM

## 2022-06-06 DIAGNOSIS — Z79899 Other long term (current) drug therapy: Secondary | ICD-10-CM | POA: Diagnosis not present

## 2022-06-06 DIAGNOSIS — Z5112 Encounter for antineoplastic immunotherapy: Secondary | ICD-10-CM | POA: Insufficient documentation

## 2022-06-06 DIAGNOSIS — Z9011 Acquired absence of right breast and nipple: Secondary | ICD-10-CM | POA: Diagnosis not present

## 2022-06-06 DIAGNOSIS — Z170 Estrogen receptor positive status [ER+]: Secondary | ICD-10-CM | POA: Diagnosis not present

## 2022-06-06 DIAGNOSIS — Z5181 Encounter for therapeutic drug level monitoring: Secondary | ICD-10-CM

## 2022-06-06 DIAGNOSIS — C7951 Secondary malignant neoplasm of bone: Secondary | ICD-10-CM | POA: Diagnosis not present

## 2022-06-06 LAB — CBC WITH DIFFERENTIAL (CANCER CENTER ONLY)
Abs Immature Granulocytes: 0.01 10*3/uL (ref 0.00–0.07)
Basophils Absolute: 0 10*3/uL (ref 0.0–0.1)
Basophils Relative: 1 %
Eosinophils Absolute: 0.1 10*3/uL (ref 0.0–0.5)
Eosinophils Relative: 2 %
HCT: 41.5 % (ref 36.0–46.0)
Hemoglobin: 14.3 g/dL (ref 12.0–15.0)
Immature Granulocytes: 0 %
Lymphocytes Relative: 25 %
Lymphs Abs: 0.9 10*3/uL (ref 0.7–4.0)
MCH: 31.8 pg (ref 26.0–34.0)
MCHC: 34.5 g/dL (ref 30.0–36.0)
MCV: 92.4 fL (ref 80.0–100.0)
Monocytes Absolute: 0.3 10*3/uL (ref 0.1–1.0)
Monocytes Relative: 9 %
Neutro Abs: 2.4 10*3/uL (ref 1.7–7.7)
Neutrophils Relative %: 63 %
Platelet Count: 319 10*3/uL (ref 150–400)
RBC: 4.49 MIL/uL (ref 3.87–5.11)
RDW: 13.7 % (ref 11.5–15.5)
WBC Count: 3.8 10*3/uL — ABNORMAL LOW (ref 4.0–10.5)
nRBC: 0 % (ref 0.0–0.2)

## 2022-06-06 LAB — CMP (CANCER CENTER ONLY)
ALT: 20 U/L (ref 0–44)
AST: 25 U/L (ref 15–41)
Albumin: 3.9 g/dL (ref 3.5–5.0)
Alkaline Phosphatase: 128 U/L — ABNORMAL HIGH (ref 38–126)
Anion gap: 10 (ref 5–15)
BUN: 15 mg/dL (ref 6–20)
CO2: 24 mmol/L (ref 22–32)
Calcium: 9.5 mg/dL (ref 8.9–10.3)
Chloride: 109 mmol/L (ref 98–111)
Creatinine: 0.84 mg/dL (ref 0.44–1.00)
GFR, Estimated: >60 mL/min (ref >60)
Glucose, Bld: 147 mg/dL — ABNORMAL HIGH (ref 70–99)
Potassium: 3.3 mmol/L — ABNORMAL LOW (ref 3.5–5.1)
Sodium: 143 mmol/L (ref 135–145)
Total Bilirubin: 0.7 mg/dL (ref 0.3–1.2)
Total Protein: 7.1 g/dL (ref 6.5–8.1)

## 2022-06-06 MED ORDER — FAM-TRASTUZUMAB DERUXTECAN-NXKI CHEMO 100 MG IV SOLR
5.6200 mg/kg | Freq: Once | INTRAVENOUS | Status: AC
  Administered 2022-06-06: 360 mg via INTRAVENOUS
  Filled 2022-06-06: qty 18

## 2022-06-06 MED ORDER — DIPHENHYDRAMINE HCL 25 MG PO CAPS
25.0000 mg | ORAL_CAPSULE | Freq: Once | ORAL | Status: AC
  Administered 2022-06-06: 25 mg via ORAL
  Filled 2022-06-06: qty 1

## 2022-06-06 MED ORDER — POTASSIUM CHLORIDE CRYS ER 20 MEQ PO TBCR
20.0000 meq | EXTENDED_RELEASE_TABLET | Freq: Every day | ORAL | 0 refills | Status: DC
  Filled 2022-06-06: qty 30, 30d supply, fill #0

## 2022-06-06 MED ORDER — PALONOSETRON HCL INJECTION 0.25 MG/5ML
0.2500 mg | Freq: Once | INTRAVENOUS | Status: AC
  Administered 2022-06-06: 0.25 mg via INTRAVENOUS
  Filled 2022-06-06: qty 5

## 2022-06-06 MED ORDER — ACETAMINOPHEN 325 MG PO TABS
650.0000 mg | ORAL_TABLET | Freq: Once | ORAL | Status: AC
  Administered 2022-06-06: 650 mg via ORAL
  Filled 2022-06-06: qty 2

## 2022-06-06 MED ORDER — DEXTROSE 5 % IV SOLN: Freq: Once | INTRAVENOUS | Status: AC

## 2022-06-06 MED ORDER — SODIUM CHLORIDE 0.9 % IV SOLN
10.0000 mg | Freq: Once | INTRAVENOUS | Status: AC
  Administered 2022-06-06: 10 mg via INTRAVENOUS
  Filled 2022-06-06: qty 10

## 2022-06-06 MED ORDER — SODIUM CHLORIDE 0.9 % IV SOLN
10.0000 mg | Freq: Once | INTRAVENOUS | Status: DC
  Filled 2022-06-06: qty 1

## 2022-06-06 NOTE — Progress Notes (Signed)
Per Dr. Lindi Adie - okay to proceed with Enhertu treatment with echocardiogram from 02/27/22. New echo ordered and patient provided with information to schedule appointment at earliest convenience.

## 2022-06-06 NOTE — Progress Notes (Signed)
Potassium 3.3, pt denies diarrhea at this time.  Verbal orders received from MD for pt to start Potassium chloride 20 mEq daily.  Prescription sent to pharmacy on file, pt educated and verbalized understanding.

## 2022-06-06 NOTE — Patient Instructions (Signed)
[facility] MEDICAL ONCOLOGY   Discharge Instructions: Thank you for choosing [facility] to provide your oncology and hematology care.   If you have a lab appointment with the Cancer Center, please go directly to the Cancer Center and check in at the registration area.   Wear comfortable clothing and clothing appropriate for easy access to any Portacath or PICC line.   We strive to give you quality time with your provider. You may need to reschedule your appointment if you arrive late (15 or more minutes).  Arriving late affects you and other patients whose appointments are after yours.  Also, if you miss three or more appointments without notifying the office, you may be dismissed from the clinic at the provider's discretion.      For prescription refill requests, have your pharmacy contact our office and allow 72 hours for refills to be completed.    Today you received the following chemotherapy and/or immunotherapy agents: fam-trastuzumab deruxtecan-nxki (ENHERTU)      To help prevent nausea and vomiting after your treatment, we encourage you to take your nausea medication as directed.  BELOW ARE SYMPTOMS THAT SHOULD BE REPORTED IMMEDIATELY: *FEVER GREATER THAN 100.4 F (38 C) OR HIGHER *CHILLS OR SWEATING *NAUSEA AND VOMITING THAT IS NOT CONTROLLED WITH YOUR NAUSEA MEDICATION *UNUSUAL SHORTNESS OF BREATH *UNUSUAL BRUISING OR BLEEDING *URINARY PROBLEMS (pain or burning when urinating, or frequent urination) *BOWEL PROBLEMS (unusual diarrhea, constipation, pain near the anus) TENDERNESS IN MOUTH AND THROAT WITH OR WITHOUT PRESENCE OF ULCERS (sore throat, sores in mouth, or a toothache) UNUSUAL RASH, SWELLING OR PAIN  UNUSUAL VAGINAL DISCHARGE OR ITCHING   Items with * indicate a potential emergency and should be followed up as soon as possible or go to the Emergency Department if any problems should occur.  Please show the CHEMOTHERAPY ALERT CARD or  IMMUNOTHERAPY ALERT CARD at check-in to the Emergency Department and triage nurse.  Should you have questions after your visit or need to cancel or reschedule your appointment, please contact [facility]  Dept: [phone]  and follow the prompts.  Office hours are 8:00 a.m. to 4:30 p.m. Monday - Friday. Please note that voicemails left after 4:00 p.m. may not be returned until the following business day.  We are closed weekends and major holidays. You have access to a nurse at all times for urgent questions. Please call the main number to the clinic Dept: [phone] and follow the prompts.   For any non-urgent questions, you may also contact your provider using MyChart. We now offer e-Visits for anyone 18 and older to request care online for non-urgent symptoms. For details visit [email or web address].   Also download the MyChart app! Go to the app store, search "MyChart", open the app, select [facility], and log in with your MyChart username and password.  Masks are optional in the cancer centers. If you would like for your care team to wear a mask while they are taking care of you, please let them know. You may have one support person who is at least 53 years old accompany you for your appointments. 

## 2022-06-14 ENCOUNTER — Other Ambulatory Visit (HOSPITAL_COMMUNITY): Payer: Self-pay

## 2022-06-14 ENCOUNTER — Ambulatory Visit (HOSPITAL_COMMUNITY): Admission: RE | Admit: 2022-06-14 | Payer: Medicare PPO | Source: Ambulatory Visit

## 2022-06-19 ENCOUNTER — Other Ambulatory Visit: Payer: Self-pay | Admitting: *Deleted

## 2022-06-19 ENCOUNTER — Other Ambulatory Visit (HOSPITAL_COMMUNITY): Payer: Self-pay

## 2022-06-19 ENCOUNTER — Ambulatory Visit (HOSPITAL_COMMUNITY)
Admission: RE | Admit: 2022-06-19 | Discharge: 2022-06-19 | Disposition: A | Discharge status: Home / Self Care | Payer: Medicare PPO | Source: Ambulatory Visit | Attending: Hematology and Oncology | Admitting: Hematology and Oncology

## 2022-06-19 DIAGNOSIS — Z5181 Encounter for therapeutic drug level monitoring: Secondary | ICD-10-CM | POA: Insufficient documentation

## 2022-06-19 DIAGNOSIS — Z0189 Encounter for other specified special examinations: Secondary | ICD-10-CM | POA: Diagnosis not present

## 2022-06-19 DIAGNOSIS — I10 Essential (primary) hypertension: Secondary | ICD-10-CM | POA: Diagnosis not present

## 2022-06-19 DIAGNOSIS — Z79899 Other long term (current) drug therapy: Secondary | ICD-10-CM | POA: Diagnosis not present

## 2022-06-19 LAB — ECHOCARDIOGRAM COMPLETE
AR max vel: 2.01 cm2
AV Peak grad: 8.8 mmHg
Ao pk vel: 1.48 m/s
Area-P 1/2: 4.89 cm2
S' Lateral: 2.6 cm

## 2022-06-19 MED ORDER — METRONIDAZOLE 0.75 % EX GEL: Freq: Two times a day (BID) | CUTANEOUS | 1 refills | Status: DC

## 2022-06-19 MED ORDER — METRONIDAZOLE 0.75 % EX GEL
Freq: Two times a day (BID) | CUTANEOUS | 1 refills | Status: DC
  Filled 2022-06-19: qty 45, 30d supply, fill #0

## 2022-06-24 NOTE — Progress Notes (Signed)
Patient Care Team: Nicholas Lose, MD as PCP - General (Hematology and Oncology)  DIAGNOSIS: No diagnosis found.  SUMMARY OF ONCOLOGIC HISTORY: Oncology History  Breast cancer of upper-outer quadrant of right female breast ([place])  09/21/2010 Mammogram   right breast linear and segmental pleomorphic calcifications from 4:00 to 6:00 position ultrasound revealed 1.6 cm and 1.1 cm masses   09/27/2010 Initial Biopsy   ultrasound-guided biopsy of all masses showed DCIS grade 2; patient went to cancer treatment centers of [place] for second opinion and delayed therapy   01/26/2011 Surgery   right mastectomy followed by reconstruction: 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 But she could not afford these trips.   01/08/2014 Breast MRI   right breast multiple enhancing masses within the soft tissues largest 5.6 cm in wall skin surface and the capsule of the silicone prosthesis, contiguous nodules involving in inferomedial breast and tired and subcentimeter nodules across the midline   08/29/2015 Surgery   Right mastectomy: IDC with invol of skin and skin ulceration, breast capsule inv cancer, superior medial margin positive, 1/1 LN positive, grade 3, 14.3 cm, 3.5 cm, ALI, chest wall involv, ER 90-100%, PR 80-90%, HER-2 negative, Ki 67 40-50% T4CN1 (St 3B)   03/05/2016 -  Anti-estrogen oral therapy   Tamoxifen 20 mg daily stopped due to headache and uncontrolled hypertension. Decrease to 10 mg daily 04/03/2016, stopped June 2017 and took an estrogen metabolizer  over-the-counter; started Aromasin April 2018 from a [place] physician, Zoladex started on 05/2017 and switched to Letrozole in 09/2017   01/04/2020 Surgery   Patient presented to the ED on 01/04/20 for worsening lower extremity numbness and underwent a decompressive laminectomy with tumor resection at T9 that was complicated with acute blood loss anemia and leukocytosis.   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 with above-mentioned history of metastatic breast cancer currently on chemotherapy with Enhertu. She presents to the clinic today for follow-up.    ALLERGIES:  is allergic to amoxicillin, penicillins, pork-derived products, penicillin g, and tamoxifen.  MEDICATIONS:  Current Outpatient Medications  Medication Sig Dispense Refill   acetaminophen (  TYLENOL) 500 MG tablet Take 2 tablets (1,000 mg total) by mouth every 6 (six) hours as needed for fever.     Ascorbic Acid (VITAMIN C PO) Take 1,000 mg by mouth daily.     atenolol (TENORMIN) 50  MG tablet Take 1 tablet (50 mg total) by mouth 2 (two) times daily. 180 tablet 3   celecoxib (CELEBREX) 200 MG capsule Take 1 capsule (200 mg total) by mouth at bedtime. 30 capsule 3   Cholecalciferol (VITAMIN D3) 125 MCG (5000 UT) CAPS Take 1 capsule (5,000 Units total) by mouth daily. 30 capsule 3   diazepam (VALIUM) 5 MG tablet Take 1 tablet (5 mg total) by mouth every 12 (twelve) hours as needed for anxiety or muscle spasms (sleep). 60 tablet 0   enoxaparin (LOVENOX) 40 MG/0.4ML injection Inject 0.4 mLs (40 mg total) into the skin daily. 24 mL 0   lip balm (CARMEX) ointment Apply 1 application topically as needed for lip care (cold sores). 7 g 0   megestrol (MEGACE ES) 625 MG/5ML suspension Take 5 mLs (625 mg total) by mouth daily. 150 mL 3   metroNIDAZOLE (METROGEL) 0.75 % gel Apply topically 2 (two) times daily. 45 g 1   morphine (MSIR) 30 MG tablet Take 1 tablet (30 mg total) by mouth every 4 (four) hours as needed for severe pain. 120 tablet 0   Multiple Vitamins-Minerals (MULTIVITAMIN WITH MINERALS) tablet FLORADIX Supplement-Iron +minerals     Nutritional Supplements (,FEEDING SUPPLEMENT, PROSOURCE PLUS) liquid Take 30 mLs by mouth 4 (four) times daily - after meals and at bedtime.     ondansetron (ZOFRAN) 8 MG tablet Take by mouth.     oxyCODONE ER (XTAMPZA ER) 9 MG C12A Take 1 capsule by mouth 2 times daily. 60 capsule 0   polyethylene glycol (MIRALAX / GLYCOLAX) 17 g packet Take 17 g by mouth daily. 30 each 0   potassium chloride SA (KLOR-CON M) 20 MEQ tablet Take 1 tablet by mouth daily. 30 tablet 0   pregabalin (LYRICA) 50 MG capsule Take 1 capsule (50 mg total) by mouth at bedtime. 30 capsule 6   Silver Hydrogel GEL Apply to wound bed as directed with each dressing change. 44.4 mL 0   No current facility-administered medications for this visit.    PHYSICAL EXAMINATION: ECOG PERFORMANCE STATUS: {CHL ONC ECOG PS:[phone]}  There were no vitals filed for this visit. There were  no vitals filed for this visit.  BREAST:*** No palpable masses or nodules in either right or left breasts. No palpable axillary supraclavicular or infraclavicular adenopathy no breast tenderness or nipple discharge. (exam performed in the presence of a chaperone)  LABORATORY DATA:  I have reviewed the data as listed    Latest Ref Rng & Units 06/06/2022    2:46 PM 05/15/2022    8:59 AM 04/24/2022    7:59 AM  CMP  Glucose 70 - 99 mg/dL 147  97  108   BUN 6 - 20 mg/dL 15  18  14    Creatinine 0.44 - 1.00 mg/dL 0.84  0.89  0.88   Sodium 135 - 145 mmol/L 143  141  142   Potassium 3.5 - 5.1 mmol/L 3.3  3.9  3.5   Chloride 98 - 111 mmol/L 109  107  107   CO2 22 - 32 mmol/L 24  28  28    Calcium 8.9 - 10.3 mg/dL 9.5  10.0  9.8   Total Protein 6.5 - 8.1 g/dL 7.1  7.4  7.1   Total Bilirubin 0.3 - 1.2 mg/dL 0.7  0.5  0.3   Alkaline Phos 38 - 126 U/L 128  148  144   AST 15 - 41 U/L 25  24  26    ALT 0 - 44 U/L 20  20  24      Lab Results  Component Value Date   WBC 3.8 (L) 06/06/2022   HGB 14.3 06/06/2022   HCT 41.5 06/06/2022   MCV 92.4 06/06/2022   PLT 319 06/06/2022   NEUTROABS 2.4 06/06/2022    ASSESSMENT & PLAN:  No problem-specific Assessment & Plan notes found for this encounter.    No orders of the defined types were placed in this encounter.  The patient has a good understanding of the overall plan. she agrees with it. she will call with any problems that may develop before the next visit here. Total time spent: 30 mins including face to face time and time spent for planning, charting and co-ordination of care   Suzzette Righter, [place] 06/24/22    I Gardiner Coins am scribing for Dr. Lindi Adie  ***

## 2022-06-26 ENCOUNTER — Other Ambulatory Visit (HOSPITAL_COMMUNITY): Payer: Self-pay

## 2022-06-26 ENCOUNTER — Encounter: Payer: Self-pay | Admitting: Nurse Practitioner

## 2022-06-26 ENCOUNTER — Other Ambulatory Visit: Payer: Self-pay

## 2022-06-26 ENCOUNTER — Inpatient Hospital Stay: Payer: Medicare PPO

## 2022-06-26 ENCOUNTER — Inpatient Hospital Stay (HOSPITAL_BASED_OUTPATIENT_CLINIC_OR_DEPARTMENT_OTHER): Payer: Medicare PPO | Admitting: Nurse Practitioner

## 2022-06-26 ENCOUNTER — Inpatient Hospital Stay (HOSPITAL_BASED_OUTPATIENT_CLINIC_OR_DEPARTMENT_OTHER): Payer: Medicare PPO | Admitting: Hematology and Oncology

## 2022-06-26 ENCOUNTER — Other Ambulatory Visit: Payer: Self-pay | Admitting: Hematology and Oncology

## 2022-06-26 VITALS — BP 141/99 | HR 111

## 2022-06-26 VITALS — BP 160/88 | HR 107 | Temp 97.3°F | Resp 18 | Ht 65.0 in | Wt 140.0 lb

## 2022-06-26 VITALS — BP 167/113 | HR 101 | Temp 98.1°F | Resp 17 | Wt 140.7 lb

## 2022-06-26 DIAGNOSIS — Z515 Encounter for palliative care: Secondary | ICD-10-CM | POA: Diagnosis not present

## 2022-06-26 DIAGNOSIS — R530 Neoplastic (malignant) related fatigue: Secondary | ICD-10-CM | POA: Diagnosis not present

## 2022-06-26 DIAGNOSIS — C50411 Malignant neoplasm of upper-outer quadrant of right female breast: Secondary | ICD-10-CM

## 2022-06-26 DIAGNOSIS — C50919 Malignant neoplasm of unspecified site of unspecified female breast: Secondary | ICD-10-CM | POA: Diagnosis not present

## 2022-06-26 DIAGNOSIS — C7949 Secondary malignant neoplasm of other parts of nervous system: Secondary | ICD-10-CM

## 2022-06-26 DIAGNOSIS — Z5112 Encounter for antineoplastic immunotherapy: Secondary | ICD-10-CM | POA: Diagnosis not present

## 2022-06-26 DIAGNOSIS — Z170 Estrogen receptor positive status [ER+]: Secondary | ICD-10-CM | POA: Diagnosis not present

## 2022-06-26 LAB — CBC WITH DIFFERENTIAL (CANCER CENTER ONLY)
Abs Immature Granulocytes: 0.01 10*3/uL (ref 0.00–0.07)
Basophils Absolute: 0 10*3/uL (ref 0.0–0.1)
Basophils Relative: 1 %
Eosinophils Absolute: 0 10*3/uL (ref 0.0–0.5)
Eosinophils Relative: 1 %
HCT: 38 % (ref 36.0–46.0)
Hemoglobin: 13.4 g/dL (ref 12.0–15.0)
Immature Granulocytes: 0 %
Lymphocytes Relative: 18 %
Lymphs Abs: 0.7 10*3/uL (ref 0.7–4.0)
MCH: 32 pg (ref 26.0–34.0)
MCHC: 35.3 g/dL (ref 30.0–36.0)
MCV: 90.7 fL (ref 80.0–100.0)
Monocytes Absolute: 0.4 10*3/uL (ref 0.1–1.0)
Monocytes Relative: 9 %
Neutro Abs: 2.8 10*3/uL (ref 1.7–7.7)
Neutrophils Relative %: 71 %
Platelet Count: 298 10*3/uL (ref 150–400)
RBC: 4.19 MIL/uL (ref 3.87–5.11)
RDW: 13.2 % (ref 11.5–15.5)
WBC Count: 3.9 10*3/uL — ABNORMAL LOW (ref 4.0–10.5)
nRBC: 0 % (ref 0.0–0.2)

## 2022-06-26 LAB — CMP (CANCER CENTER ONLY)
ALT: 15 U/L (ref 0–44)
AST: 21 U/L (ref 15–41)
Albumin: 4.4 g/dL (ref 3.5–5.0)
Alkaline Phosphatase: 132 U/L — ABNORMAL HIGH (ref 38–126)
Anion gap: 6 (ref 5–15)
BUN: 14 mg/dL (ref 6–20)
CO2: 28 mmol/L (ref 22–32)
Calcium: 9.9 mg/dL (ref 8.9–10.3)
Chloride: 108 mmol/L (ref 98–111)
Creatinine: 0.84 mg/dL (ref 0.44–1.00)
GFR, Estimated: >60 mL/min (ref >60)
Glucose, Bld: 118 mg/dL — ABNORMAL HIGH (ref 70–99)
Potassium: 3.2 mmol/L — ABNORMAL LOW (ref 3.5–5.1)
Sodium: 142 mmol/L (ref 135–145)
Total Bilirubin: 0.5 mg/dL (ref 0.3–1.2)
Total Protein: 7.1 g/dL (ref 6.5–8.1)

## 2022-06-26 MED ORDER — FAM-TRASTUZUMAB DERUXTECAN-NXKI CHEMO 100 MG IV SOLR
5.6700 mg/kg | Freq: Once | INTRAVENOUS | Status: AC
  Administered 2022-06-26: 360 mg via INTRAVENOUS
  Filled 2022-06-26: qty 18

## 2022-06-26 MED ORDER — POTASSIUM CHLORIDE CRYS ER 20 MEQ PO TBCR
20.0000 meq | EXTENDED_RELEASE_TABLET | Freq: Every day | ORAL | 3 refills | Status: DC
  Filled 2022-06-26: qty 30, 30d supply, fill #0

## 2022-06-26 MED ORDER — DEXTROSE 5 % IV SOLN: Freq: Once | INTRAVENOUS | Status: AC

## 2022-06-26 MED ORDER — SODIUM CHLORIDE 0.9 % IV SOLN
10.0000 mg | Freq: Once | INTRAVENOUS | Status: AC
  Administered 2022-06-26: 10 mg via INTRAVENOUS
  Filled 2022-06-26: qty 10

## 2022-06-26 MED ORDER — PALONOSETRON HCL INJECTION 0.25 MG/5ML
0.2500 mg | Freq: Once | INTRAVENOUS | Status: AC
  Administered 2022-06-26: 0.25 mg via INTRAVENOUS
  Filled 2022-06-26: qty 5

## 2022-06-26 MED ORDER — DIPHENHYDRAMINE HCL 25 MG PO CAPS
50.0000 mg | ORAL_CAPSULE | Freq: Once | ORAL | Status: AC
  Administered 2022-06-26: 50 mg via ORAL
  Filled 2022-06-26: qty 2

## 2022-06-26 MED ORDER — ACETAMINOPHEN 325 MG PO TABS
650.0000 mg | ORAL_TABLET | Freq: Once | ORAL | Status: AC
  Administered 2022-06-26: 650 mg via ORAL
  Filled 2022-06-26: qty 2

## 2022-06-26 NOTE — Assessment & Plan Note (Signed)
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  Treatment summary: 1.Right mastectomy 08/29/2015 at [facility] (Dr.Singh): IDC with invol of skin and skin ulceration, breast capsule inv cancer, superior medial margin positive, 1/1 LN positive, grade 3, 14.3 cm, 3.5 cm, ALI, chest wall involv, ER 90-100%, PR 80-90%, HER-2 negative, Ki 67 40-50% T4CN1 (St 3B) 2.Patient started tamoxifen but developed severe headache with severe hypertension and tamoxifen was discontinued 03/17/2016. 3.02/2017 started on Exemestane, Zoladex started 05/29/2017 4.Hospitalization for paraplegia: T9 vertebral body compression status post decompressive laminectomy and tumor resection 01/04/2020 5.Palliative radiation to the thoracic spine 01/28/2020 6.Ibrance with letrozole started April 2021discontinued 07/24/2021 7.Enhertu started 07/31/2021 every 3 weeks  ----------------------------------------------------------------------------------------------------------------- 05/11/2021: CT angiogram: Metastatic breast cancer soft tissue breast masses greater on the right, axillary supraclavicular lymphadenopathy, multiple bone metastases  Hospitalization 08/25/2021-09/05/2021: Worsening right chest wall pain and infection(patient has appointments with palliative care as well as infectious disease)  Current treatment: Enhertu started12/13/2022, today cycle12 Toxicities:Tolerating it extremely well without any major problems. CT CAP 01/30/2022: Regression of the large Rt breast mass and Axill LN, sclerotic bone mets, New lesion in liver 1.5 cm Chest wall tumors: Marked improvement  Wound care:She is able to manage with the help of home  health CT CAP 04/25/2022: Infiltrating tumor right chest wall decrease in size, decreased bilateral axillary lymph nodes, decrease in size of liver metastases, increase sclerosis of bony lesions suggestive of healing  Radiology review: Based on excellent response to treatment we will continue with the same.  Return to clinic in 3 weeks for cycle 13

## 2022-06-26 NOTE — Patient Instructions (Signed)
[facility]ICAL [facility]COLOG[facility]  Discharge Instructions: Thank you for choosing [facility] to provide your oncology and hematology care.   If you have a lab appointment with the Cancer Center, please go directly to the Cancer Center and check in at the registration area.   Wear comfortable clothing and clothing appropriate for easy access to any Portacath or PICC line.   We strive to give you quality time with your provider. You may need to reschedule your appointment if you arrive late (15 or more minutes).  Arriving late affects you and other patients whose appointments are after yours.  Also, if you miss three or more appointments without notifying the office, you may be dismissed from the clinic at the provider's discretion.      For prescription refill requests, have your pharmacy contact our office and allow 72 hours for refills to be completed.    Today you received the following chemotherapy and/or immunotherapy agents: Enhertu      To help prevent nausea and vomiting after your treatment, we encourage you to take your nausea medication as directed.  BELOW ARE SYMPTOMS THAT SHOULD BE REPORTED IMMEDIATELY: *FEVER GREATER THAN 100.4 F (38 C) OR HIGHER *CHILLS OR SWEATING *NAUSEA AND VOMITING THAT IS NOT CONTROLLED WITH YOUR NAUSEA MEDICATION *UNUSUAL SHORTNESS OF BREATH *UNUSUAL BRUISING OR BLEEDING *URINARY PROBLEMS (pain or burning when urinating, or frequent urination) *BOWEL PROBLEMS (unusual diarrhea, constipation, pain near the anus) TENDERNESS IN MOUTH AND THROAT WITH OR WITHOUT PRESENCE OF ULCERS (sore throat, sores in mouth, or a toothache) UNUSUAL RASH, SWELLING OR PAIN  UNUSUAL VAGINAL DISCHARGE OR ITCHING   Items with * indicate a potential emergency and should be followed up as soon as possible or go to the Emergency Department if any problems should occur.  Please show the CHEMOTHERAPY ALERT CARD or IMMUNOTHERAPY ALERT CARD at check-in to  the Emergency Department and triage nurse.  Should you have questions after your visit or need to cancel or reschedule your appointment, please contact [facility]  Dept: [phone]  and follow the prompts.  Office hours are 8:00 a.m. to 4:30 p.m. Monday - Friday. Please note that voicemails left after 4:00 p.m. may not be returned until the following business day.  We are closed weekends and major holidays. You have access to a nurse at all times for urgent questions. Please call the main number to the clinic Dept: [phone] and follow the prompts.   For any non-urgent questions, you may also contact your provider using MyChart. We now offer e-Visits for anyone 18 and older to request care online for non-urgent symptoms. For details visit [email or web address].   Also download the MyChart app! Go to the app store, search "MyChart", open the app, select [facility], and log in with your MyChart username and password.  Masks are optional in the cancer centers. If you would like for your care team to wear a mask while they are taking care of you, please let them know. You may have one support person who is at least 53 years old accompany you for your appointments. 

## 2022-06-26 NOTE — Progress Notes (Signed)
Misty Arnold  [phone] [phone]   Name: Misty Arnold Date: 06/26/2022 MRN: [id number]  DOB: [birth year]  Patient Care Team: Nicholas Lose, MD as PCP - General (Hematology and Oncology)   INTERVAL HISTORY: Misty Arnold is a 51 y.o. female with right metastatic breast cancer s/p mastectomy, large right chest wound secondary to necrotic mass, weankess due to T9 vertebral compression s/p tumor resection and decompressive laminectomy, and hypertension. Patient did not receive treatment for some time after seeking care in [place]. Currently receiving Enhertu treatment. Palliative following for symptom management and goals of care.   SOCIAL HISTORY:     reports that she has never smoked. She has never used smokeless tobacco. She reports that she does not currently use alcohol. She reports that she does not use drugs.  ADVANCE DIRECTIVES:  Advance directives completed during recent hospitalization 08/27/2021.  Documents are on file and have been reviewed personally and [name].  CODE STATUS: Full code  PAST MEDICAL HISTORY: Past Medical History:  Diagnosis Date   Abnormal uterine bleeding    Anemia    Breast cancer ([employer])    Depression    Fibroid    Hormone disorder    Hypertension    Infertility, female    ALLERGIES:  is allergic to amoxicillin, penicillins, pork-derived products, penicillin g, and tamoxifen.  MEDICATIONS:  Current Outpatient Medications  Medication Sig Dispense Refill   acetaminophen (TYLENOL) 500 MG tablet Take 2 tablets (1,000 mg total) by mouth every 6 (six) hours as needed for fever.     Ascorbic Acid (VITAMIN C PO) Take 1,000 mg by mouth daily.     atenolol (TENORMIN) 50 MG tablet Take 1 tablet (50 mg total) by mouth 2 (two) times daily. 180 tablet 3   celecoxib (CELEBREX) 200 MG capsule Take 1 capsule (200 mg total) by mouth at bedtime. 30 capsule 3   Cholecalciferol (VITAMIN D3) 125 MCG  (5000 UT) CAPS Take 1 capsule (5,000 Units total) by mouth daily. 30 capsule 3   diazepam (VALIUM) 5 MG tablet Take 1 tablet (5 mg total) by mouth every 12 (twelve) hours as needed for anxiety or muscle spasms (sleep). 60 tablet 0   enoxaparin (LOVENOX) 40 MG/0.4ML injection Inject 0.4 mLs (40 mg total) into the skin daily. 24 mL 0   lip balm (CARMEX) ointment Apply 1 application topically as needed for lip care (cold sores). 7 g 0   megestrol (MEGACE ES) 625 MG/5ML suspension Take 5 mLs (625 mg total) by mouth daily. 150 mL 3   metroNIDAZOLE (METROGEL) 0.75 % gel Apply topically 2 (two) times daily. 45 g 1   morphine (MSIR) 30 MG tablet Take 1 tablet (30 mg total) by mouth every 4 (four) hours as needed for severe pain. 120 tablet 0   Multiple Vitamins-Minerals (MULTIVITAMIN WITH MINERALS) tablet FLORADIX Supplement-Iron +minerals     Nutritional Supplements (,FEEDING SUPPLEMENT, PROSOURCE PLUS) liquid Take 30 mLs by mouth 4 (four) times daily - after meals and at bedtime.     ondansetron (ZOFRAN) 8 MG tablet Take by mouth.     oxyCODONE ER (XTAMPZA ER) 9 MG C12A Take 1 capsule by mouth 2 times daily. 60 capsule 0   polyethylene glycol (MIRALAX / GLYCOLAX) 17 g packet Take 17 g by mouth daily. 30 each 0   potassium chloride SA (KLOR-CON M) 20 MEQ tablet Take 1 tablet by mouth daily. 30 tablet 3   pregabalin (LYRICA) 50 MG capsule Take  1 capsule (50 mg total) by mouth at bedtime. 30 capsule 6   Silver Hydrogel GEL Apply to wound bed as directed with each dressing change. 44.4 mL 0   No current facility-administered medications for this visit.    VITAL SIGNS: BP (!) 167/113 (BP Location: Left Arm, Patient Position: Sitting) Comment: Nurse was notify  Pulse (!) 101 Comment: Nurse was notify  Temp 98.1 F (36.7 C) (Oral)   Resp 17   Wt 140 lb 11.2 oz (63.8 kg)   SpO2 96%   BMI 23.41 kg/m  Filed Weights   06/26/22 0927  Weight: 140 lb 11.2 oz (63.8 kg)    Estimated body mass index is  23.41 kg/m as calculated from the following:   Height as of an earlier encounter on 06/26/22: '5\' 5"'$  (1.651 m).   Weight as of this encounter: 140 lb 11.2 oz (63.8 kg).  PERFORMANCE STATUS (ECOG) : 0 - Asymptomatic  Physical Exam NAD, ambulatory, well developed RRR Right breast wound healing, no drainage, borders now closed AAO x3, mood appropriate   IMPRESSION:  I saw [name] during her infusion. No acute distress. Is doing well. Continues to live with her brother. Appetite has improved. She is not having to drink Ensure daily. Tolerating Enhertu treatments.   No symptom management needs at this time.   Denies pain, constipation, diarrhea, nausea, or vomiting.    Patient knows to contact our office as needed. We will continue to follow and be available. I am glad she is much improved.     PLAN: Much improved. Pain is resolved. Occasional Tylenol use for minor aches and headaches. Appetite much improved. Weight stable.  Right breast wound healing.  I will plan to see her back in 6-8 weeks.  Patient expressed understanding and was in agreement with this plan. She also understands that She can call the clinic at any time with any questions, concerns, or complaints.   Time Total: 20 min.   Visit consisted of counseling and education dealing with the complex and emotionally intense issues of symptom management and palliative care in the setting of serious and potentially life-threatening illness.Greater than 50%  of this time was spent counseling and coordinating care related to the above assessment and plan.  [name], AGPCNP-BC  Palliative Medicine Team/[facility]

## 2022-06-27 ENCOUNTER — Telehealth: Payer: Self-pay | Admitting: Hematology and Oncology

## 2022-06-27 NOTE — Telephone Encounter (Signed)
Scheduled appointment per 8/22 los. Left voicemail.

## 2022-07-13 ENCOUNTER — Ambulatory Visit (HOSPITAL_COMMUNITY)
Admission: RE | Admit: 2022-07-13 | Discharge: 2022-07-13 | Disposition: A | Discharge status: Home / Self Care | Payer: Medicare PPO | Source: Ambulatory Visit | Attending: Hematology and Oncology | Admitting: Hematology and Oncology

## 2022-07-13 DIAGNOSIS — Z170 Estrogen receptor positive status [ER+]: Secondary | ICD-10-CM | POA: Insufficient documentation

## 2022-07-13 DIAGNOSIS — C50411 Malignant neoplasm of upper-outer quadrant of right female breast: Secondary | ICD-10-CM | POA: Diagnosis present

## 2022-07-13 MED ORDER — IOHEXOL 300 MG/ML  SOLN
100.0000 mL | Freq: Once | INTRAMUSCULAR | Status: AC | PRN
  Administered 2022-07-13: 100 mL via INTRAVENOUS

## 2022-07-13 NOTE — Progress Notes (Signed)
Patient Care Team: Nicholas Lose, MD as PCP - General (Hematology and Oncology)  DIAGNOSIS: No diagnosis found.  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; patient went to cancer treatment centers of [place] for second opinion and delayed therapy   01/26/2011 Surgery   right mastectomy followed by reconstruction: 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 But she could not afford these trips.   01/08/2014 Breast MRI   right breast multiple enhancing masses within the soft tissues largest 5.6 cm in wall skin surface and the capsule of the silicone prosthesis, contiguous nodules involving in inferomedial breast and tired and subcentimeter nodules across the midline   08/29/2015 Surgery   Right mastectomy: IDC with invol of skin and skin ulceration, breast capsule inv cancer, superior medial margin positive, 1/1 LN positive, grade 3, 14.3 cm, 3.5 cm, ALI, chest wall involv, ER 90-100%, PR 80-90%, HER-2 negative, Ki 67 40-50% T4CN1 (St 3B)   03/05/2016 -  Anti-estrogen oral therapy   Tamoxifen 20 mg daily stopped due to headache and uncontrolled hypertension. Decrease to 10 mg daily 04/03/2016, stopped June 2017 and took an estrogen metabolizer  over-the-counter; started Aromasin April 2018 from a [place] physician, Zoladex started on 05/2017 and switched to Letrozole in 09/2017   01/04/2020 Surgery   Patient presented to the ED on 01/04/20 for worsening lower extremity numbness and underwent a decompressive laminectomy with tumor resection at T9 that was complicated with acute blood loss anemia and leukocytosis.   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[place])  01/04/2020 Initial Diagnosis   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[place])   10/17/2021 - 06/06/2022 Chemotherapy   Patient is on Treatment Plan : BREAST METASTATIC fam-trastuzumab deruxtecan-nxki (Enhertu) q21d     10/17/2021 -  Chemotherapy   Patient is on Treatment Plan : BREAST METASTATIC Fam-Trastuzumab Deruxtecan-nxki (Enhertu) (5.4) q21d     Primary malignant neoplasm of breast with metastasis ([place])  01/08/2020 Initial Diagnosis   Metastatic breast cancer ([place])   10/17/2021 - 06/06/2022 Chemotherapy   Patient is on Treatment Plan : BREAST METASTATIC fam-trastuzumab deruxtecan-nxki (Enhertu) q21d     10/17/2021 -  Chemotherapy   Patient is on Treatment Plan : BREAST METASTATIC Fam-Trastuzumab Deruxtecan-nxki (Enhertu) (5.4) q21d  CHIEF COMPLIANT: Follow-up breast cancer to discuss Taxol  INTERVAL HISTORY: Misty Arnold is a 51 y.o with the above mention. She presents to the cliic for a follow-up to discuss Taxol.   ALLERGIES:   is allergic to amoxicillin, penicillins, pork-derived products, penicillin g, and tamoxifen.  MEDICATIONS:  Current Outpatient Medications  Medication Sig Dispense Refill   acetaminophen (TYLENOL) 500 MG tablet Take 2 tablets (1,000 mg total) by mouth every 6 (six) hours as needed for fever.     Ascorbic Acid (VITAMIN C PO) Take 1,000 mg by mouth daily.     atenolol (TENORMIN) 50 MG tablet Take 1 tablet (50 mg total) by mouth 2 (two) times daily. 180 tablet 3   celecoxib (CELEBREX) 200 MG capsule Take 1 capsule (200 mg total) by mouth at bedtime. 30 capsule 3   Cholecalciferol (VITAMIN D3) 125 MCG (5000 UT) CAPS Take 1 capsule (5,000 Units total) by mouth daily. 30 capsule 3   diazepam (VALIUM) 5 MG tablet Take 1 tablet (5 mg total) by mouth every 12 (twelve) hours as needed for anxiety or muscle spasms (sleep). 60 tablet 0   enoxaparin (LOVENOX) 40 MG/0.4ML injection Inject 0.4 mLs (40 mg total) into the skin daily. 24 mL 0   lip balm (CARMEX) ointment Apply 1 application topically as needed for lip care (cold sores). 7 g 0   megestrol (MEGACE ES) 625 MG/5ML suspension Take 5 mLs (625 mg total) by mouth daily. 150 mL 3   metroNIDAZOLE (METROGEL) 0.75 % gel Apply topically 2 (two) times daily. 45 g 1   morphine (MSIR) 30 MG tablet Take 1 tablet (30 mg total) by mouth every 4 (four) hours as needed for severe pain. 120 tablet 0   Multiple Vitamins-Minerals (MULTIVITAMIN WITH MINERALS) tablet FLORADIX Supplement-Iron +minerals     Nutritional Supplements (,FEEDING SUPPLEMENT, PROSOURCE PLUS) liquid Take 30 mLs by mouth 4 (four) times daily - after meals and at bedtime.     ondansetron (ZOFRAN) 8 MG tablet Take by mouth.     oxyCODONE ER (XTAMPZA ER) 9 MG C12A Take 1 capsule by mouth 2 times daily. 60 capsule 0   polyethylene glycol (MIRALAX / GLYCOLAX) 17 g packet Take 17 g by mouth daily. 30 each 0   potassium chloride SA (KLOR-CON M) 20 MEQ tablet Take 1 tablet by mouth daily. 30 tablet 3    pregabalin (LYRICA) 50 MG capsule Take 1 capsule (50 mg total) by mouth at bedtime. 30 capsule 6   Silver Hydrogel GEL Apply to wound bed as directed with each dressing change. 44.4 mL 0   No current facility-administered medications for this visit.    PHYSICAL EXAMINATION: ECOG PERFORMANCE STATUS: {CHL ONC ECOG PS:[id number]}  There were no vitals filed for this visit. There were no vitals filed for this visit.  BREAST:*** No palpable masses or nodules in either right or left breasts. No palpable axillary supraclavicular or infraclavicular adenopathy no breast tenderness or nipple discharge. (exam performed in the presence of a chaperone)  LABORATORY DATA:  I have reviewed the data as listed    Latest Ref Rng & Units 06/26/2022    7:59 AM 06/06/2022    2:46 PM 05/15/2022    8:59 AM  CMP  Glucose 70 - 99 mg/dL 118  147  97   BUN 6 - 20 mg/dL _0 Creatinine 0.44 - 1.00 mg/dL 0.84  0.84  0.89   Sodium 135 - 145 mmol/L 142  143  141   Potassium 3.5 - 5.1 mmol/L 3.2  3.3  3.9   Chloride 98 - 111 mmol/L 108  109  107   CO2 22 - 32 mmol/L _0 Calcium 8.9 - 10.3 mg/dL 9.9  9.5  10.0   Total Protein 6.5 - 8.1 g/dL 7.1  7.1  7.4   Total Bilirubin 0.3 - 1.2 mg/dL 0.5  0.7  0.5   Alkaline Phos 38 - 126 U/L 132  128  148   AST 15 - 41 U/L _1 ALT 0 - 44 U/L _2 Lab Results  Component Value Date   WBC 3.9 (L) 06/26/2022   HGB 13.4 06/26/2022   HCT 38.0 06/26/2022   MCV 90.7 06/26/2022   PLT 298 06/26/2022   NEUTROABS 2.8 06/26/2022    ASSESSMENT & PLAN:  No problem-specific Assessment & Plan notes found for this encounter.    No orders of the defined types were placed in this encounter.  The patient has a good understanding of the overall plan. she agrees with it. she will call with any problems that may develop before the next visit here. Total time spent: 30 mins including face to face time and time spent for planning, charting and  co-ordination of care   Suzzette Righter, [place] 07/13/22    I Gardiner Coins am scribing for Dr. Lindi Adie  ***

## 2022-07-16 MED FILL — Dexamethasone Sodium Phosphate Inj 100 MG/10ML: INTRAMUSCULAR | Qty: 1 | Status: AC

## 2022-07-17 ENCOUNTER — Inpatient Hospital Stay: Payer: Medicare PPO | Admitting: Hematology and Oncology

## 2022-07-17 ENCOUNTER — Inpatient Hospital Stay: Payer: Medicare PPO | Attending: Hematology and Oncology

## 2022-07-17 ENCOUNTER — Other Ambulatory Visit: Payer: Self-pay

## 2022-07-17 ENCOUNTER — Inpatient Hospital Stay: Payer: Medicare PPO

## 2022-07-17 VITALS — BP 171/102 | HR 88 | Resp 18

## 2022-07-17 VITALS — BP 181/104 | HR 85 | Temp 97.5°F | Resp 18 | Ht 65.0 in | Wt 143.2 lb

## 2022-07-17 DIAGNOSIS — Z170 Estrogen receptor positive status [ER+]: Secondary | ICD-10-CM | POA: Diagnosis not present

## 2022-07-17 DIAGNOSIS — C7951 Secondary malignant neoplasm of bone: Secondary | ICD-10-CM | POA: Insufficient documentation

## 2022-07-17 DIAGNOSIS — C50411 Malignant neoplasm of upper-outer quadrant of right female breast: Secondary | ICD-10-CM

## 2022-07-17 DIAGNOSIS — C7949 Secondary malignant neoplasm of other parts of nervous system: Secondary | ICD-10-CM

## 2022-07-17 DIAGNOSIS — C50919 Malignant neoplasm of unspecified site of unspecified female breast: Secondary | ICD-10-CM | POA: Diagnosis not present

## 2022-07-17 DIAGNOSIS — Z923 Personal history of irradiation: Secondary | ICD-10-CM | POA: Insufficient documentation

## 2022-07-17 DIAGNOSIS — Z79899 Other long term (current) drug therapy: Secondary | ICD-10-CM | POA: Insufficient documentation

## 2022-07-17 DIAGNOSIS — Z9011 Acquired absence of right breast and nipple: Secondary | ICD-10-CM | POA: Insufficient documentation

## 2022-07-17 DIAGNOSIS — Z9221 Personal history of antineoplastic chemotherapy: Secondary | ICD-10-CM | POA: Insufficient documentation

## 2022-07-17 DIAGNOSIS — Z5112 Encounter for antineoplastic immunotherapy: Secondary | ICD-10-CM | POA: Insufficient documentation

## 2022-07-17 LAB — CMP (CANCER CENTER ONLY)
ALT: 19 U/L (ref 0–44)
AST: 21 U/L (ref 15–41)
Albumin: 3.9 g/dL (ref 3.5–5.0)
Alkaline Phosphatase: 130 U/L — ABNORMAL HIGH (ref 38–126)
Anion gap: 7 (ref 5–15)
BUN: 10 mg/dL (ref 6–20)
CO2: 25 mmol/L (ref 22–32)
Calcium: 9.2 mg/dL (ref 8.9–10.3)
Chloride: 107 mmol/L (ref 98–111)
Creatinine: 0.89 mg/dL (ref 0.44–1.00)
GFR, Estimated: >60 mL/min (ref >60)
Glucose, Bld: 158 mg/dL — ABNORMAL HIGH (ref 70–99)
Potassium: 3.4 mmol/L — ABNORMAL LOW (ref 3.5–5.1)
Sodium: 139 mmol/L (ref 135–145)
Total Bilirubin: 0.4 mg/dL (ref 0.3–1.2)
Total Protein: 6.6 g/dL (ref 6.5–8.1)

## 2022-07-17 LAB — CBC WITH DIFFERENTIAL (CANCER CENTER ONLY)
Abs Immature Granulocytes: 0.01 10*3/uL (ref 0.00–0.07)
Basophils Absolute: 0 10*3/uL (ref 0.0–0.1)
Basophils Relative: 1 %
Eosinophils Absolute: 0 10*3/uL (ref 0.0–0.5)
Eosinophils Relative: 1 %
HCT: 40.6 % (ref 36.0–46.0)
Hemoglobin: 13.9 g/dL (ref 12.0–15.0)
Immature Granulocytes: 0 %
Lymphocytes Relative: 19 %
Lymphs Abs: 0.6 10*3/uL — ABNORMAL LOW (ref 0.7–4.0)
MCH: 32.2 pg (ref 26.0–34.0)
MCHC: 34.2 g/dL (ref 30.0–36.0)
MCV: 94 fL (ref 80.0–100.0)
Monocytes Absolute: 0.3 10*3/uL (ref 0.1–1.0)
Monocytes Relative: 9 %
Neutro Abs: 2.3 10*3/uL (ref 1.7–7.7)
Neutrophils Relative %: 70 %
Platelet Count: 247 10*3/uL (ref 150–400)
RBC: 4.32 MIL/uL (ref 3.87–5.11)
RDW: 13.4 % (ref 11.5–15.5)
WBC Count: 3.2 10*3/uL — ABNORMAL LOW (ref 4.0–10.5)
nRBC: 0 % (ref 0.0–0.2)

## 2022-07-17 MED ORDER — SODIUM CHLORIDE 0.9 % IV SOLN
10.0000 mg | Freq: Once | INTRAVENOUS | Status: AC
  Administered 2022-07-17: 10 mg via INTRAVENOUS
  Filled 2022-07-17: qty 10

## 2022-07-17 MED ORDER — DIPHENHYDRAMINE HCL 25 MG PO CAPS
50.0000 mg | ORAL_CAPSULE | Freq: Once | ORAL | Status: AC
  Administered 2022-07-17: 50 mg via ORAL
  Filled 2022-07-17: qty 2

## 2022-07-17 MED ORDER — DEXTROSE 5 % IV SOLN: Freq: Once | INTRAVENOUS | Status: AC

## 2022-07-17 MED ORDER — ACETAMINOPHEN 325 MG PO TABS
650.0000 mg | ORAL_TABLET | Freq: Once | ORAL | Status: AC
  Administered 2022-07-17: 650 mg via ORAL
  Filled 2022-07-17: qty 2

## 2022-07-17 MED ORDER — PALONOSETRON HCL INJECTION 0.25 MG/5ML
0.2500 mg | Freq: Once | INTRAVENOUS | Status: AC
  Administered 2022-07-17: 0.25 mg via INTRAVENOUS
  Filled 2022-07-17: qty 5

## 2022-07-17 MED ORDER — FAM-TRASTUZUMAB DERUXTECAN-NXKI CHEMO 100 MG IV SOLR
5.6700 mg/kg | Freq: Once | INTRAVENOUS | Status: AC
  Administered 2022-07-17: 360 mg via INTRAVENOUS
  Filled 2022-07-17: qty 18

## 2022-07-17 NOTE — Patient Instructions (Signed)
[facility] MEDICAL ONCOLOGY  Discharge Instructions: Thank you for choosing [facility] to provide your oncology and hematology care.   If you have a lab appointment with the Cancer Center, please go directly to the Cancer Center and check in at the registration area.   Wear comfortable clothing and clothing appropriate for easy access to any Portacath or PICC line.   We strive to give you quality time with your provider. You may need to reschedule your appointment if you arrive late (15 or more minutes).  Arriving late affects you and other patients whose appointments are after yours.  Also, if you miss three or more appointments without notifying the office, you may be dismissed from the clinic at the provider's discretion.      For prescription refill requests, have your pharmacy contact our office and allow 72 hours for refills to be completed.    Today you received the following chemotherapy and/or immunotherapy agents: Enhertu      To help prevent nausea and vomiting after your treatment, we encourage you to take your nausea medication as directed.  BELOW ARE SYMPTOMS THAT SHOULD BE REPORTED IMMEDIATELY: *FEVER GREATER THAN 100.4 F (38 C) OR HIGHER *CHILLS OR SWEATING *NAUSEA AND VOMITING THAT IS NOT CONTROLLED WITH YOUR NAUSEA MEDICATION *UNUSUAL SHORTNESS OF BREATH *UNUSUAL BRUISING OR BLEEDING *URINARY PROBLEMS (pain or burning when urinating, or frequent urination) *BOWEL PROBLEMS (unusual diarrhea, constipation, pain near the anus) TENDERNESS IN MOUTH AND THROAT WITH OR WITHOUT PRESENCE OF ULCERS (sore throat, sores in mouth, or a toothache) UNUSUAL RASH, SWELLING OR PAIN  UNUSUAL VAGINAL DISCHARGE OR ITCHING   Items with * indicate a potential emergency and should be followed up as soon as possible or go to the Emergency Department if any problems should occur.  Please show the CHEMOTHERAPY ALERT CARD or IMMUNOTHERAPY ALERT CARD at check-in to  the Emergency Department and triage nurse.  Should you have questions after your visit or need to cancel or reschedule your appointment, please contact [facility] MEDICAL [facility]COLOG[facility]  Dept: [phone]  and follow the prompts.  Office hours are 8:00 a.m. to 4:30 p.m. Monday - Friday. Please note that voicemails left after 4:00 p.m. may not be returned until the following business day.  We are closed weekends and major holidays. You have access to a nurse at all times for urgent questions. Please call the main number to the clinic Dept: [phone] and follow the prompts.   For any non-urgent questions, you may also contact your provider using MyChart. We now offer e-Visits for anyone 18 and older to request care online for non-urgent symptoms. For details visit [email or web address].   Also download the MyChart app! Go to the app store, search "MyChart", open the app, select [facility], and log in with your MyChart username and password.  Masks are optional in the cancer centers. If you would like for your care team to wear a mask while they are taking care of you, please let them know. You may have one support person who is at least 53 years old accompany you for your appointments. 

## 2022-07-17 NOTE — Assessment & Plan Note (Signed)
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  Treatment summary: 1.Right mastectomy 08/29/2015 at [facility] (Dr.Singh): IDC with invol of skin and skin ulceration, breast capsule inv cancer, superior medial margin positive, 1/1 LN positive, grade 3, 14.3 cm, 3.5 cm, ALI, chest wall involv, ER 90-100%, PR 80-90%, HER-2 negative, Ki 67 40-50% T4CN1 (St 3B) 2.Patient started tamoxifen but developed severe headache with severe hypertension and tamoxifen was discontinued 03/17/2016. 3.02/2017 started on Exemestane, Zoladex started 05/29/2017 4.Hospitalization for paraplegia: T9 vertebral body compression status post decompressive laminectomy and tumor resection 01/04/2020 5.Palliative radiation to the thoracic spine 01/28/2020 6.Ibrance with letrozole started April 2021discontinued 07/24/2021 7.Enhertu started 07/31/2021 every 3 weeks  ----------------------------------------------------------------------------------------------------------------- 05/11/2021: CT angiogram: Metastatic breast cancer soft tissue breast masses greater on the right, axillary supraclavicular lymphadenopathy, multiple bone metastases  Hospitalization 08/25/2021-09/05/2021: Worsening right chest wall pain and infection(patient has appointments with palliative care as well as infectious disease)  Current treatment: Enhertu started12/13/2022, today cycle12 Toxicities:Tolerating it extremely well without any major problems. CT CAP 01/30/2022: Regression of the large Rt breast mass and Axill LN, sclerotic bone mets, New lesion in liver 1.5 cm Chest wall tumors: Marked improvement  Wound care:She is able to manage with the help of home  health CT CAP 07/15/2022: New hypodense lesion inferior right lobe of the liver measuring 0.9 cm suspicious for new metastases.  No significant change in the soft tissue mass in the chest wall and unchanged bilateral axillary lymph nodes.  Unchanged lytic and sclerotic bone metastases of manubrium, T9 vertebral body and right ribs  Radiology review: The new spot in the liver is a concern but overall she is responding very well throughout her body and therefore we decided to continue with the treatment and repeat scans in 3 months.  Return to clinic in 3 weeks for cycle 14

## 2022-08-06 ENCOUNTER — Telehealth: Payer: Self-pay | Admitting: Hematology and Oncology

## 2022-08-06 MED FILL — Dexamethasone Sodium Phosphate Inj 100 MG/10ML: INTRAMUSCULAR | Qty: 1 | Status: AC

## 2022-08-06 NOTE — Telephone Encounter (Signed)
Scheduled per 10/2 in basket, pt does not want to r/s at this time

## 2022-08-07 ENCOUNTER — Inpatient Hospital Stay: Payer: Medicare PPO | Admitting: Hematology and Oncology

## 2022-08-07 ENCOUNTER — Inpatient Hospital Stay: Payer: Medicare PPO

## 2022-08-08 ENCOUNTER — Telehealth: Payer: Self-pay | Admitting: Hematology and Oncology

## 2022-08-08 NOTE — Telephone Encounter (Signed)
Scheduled appointment per WQ. Left voicemail. 

## 2022-08-27 ENCOUNTER — Other Ambulatory Visit: Payer: Self-pay | Admitting: Physician Assistant

## 2022-08-27 MED FILL — Dexamethasone Sodium Phosphate Inj 100 MG/10ML: INTRAMUSCULAR | Qty: 1 | Status: AC

## 2022-08-28 ENCOUNTER — Other Ambulatory Visit: Payer: Medicare PPO

## 2022-08-28 ENCOUNTER — Encounter: Payer: Medicare PPO | Admitting: Nurse Practitioner

## 2022-08-28 ENCOUNTER — Ambulatory Visit: Payer: Medicare PPO | Admitting: Physician Assistant

## 2022-08-28 ENCOUNTER — Ambulatory Visit: Payer: Medicare PPO

## 2022-08-29 ENCOUNTER — Inpatient Hospital Stay: Payer: Medicare PPO | Admitting: Nurse Practitioner

## 2022-08-29 ENCOUNTER — Inpatient Hospital Stay: Payer: Medicare PPO | Attending: Hematology and Oncology

## 2022-09-03 ENCOUNTER — Telehealth: Payer: Self-pay | Admitting: *Deleted

## 2022-09-03 NOTE — Telephone Encounter (Signed)
Received call from pt requesting prescription for dehumidifier with the increasing humidly in the air. RN looked through [employer] and dehumidifier is not an option to be ordered.  Per MD pt needing to purchase over the counter.  Pt educated and verbalized understanding.  Pt also states she will contact our office once the humidity decreases outside for a f/u visit.

## 2022-09-10 ENCOUNTER — Inpatient Hospital Stay: Payer: Medicare PPO | Attending: Hematology and Oncology | Admitting: Nurse Practitioner

## 2022-09-10 ENCOUNTER — Encounter: Payer: Self-pay | Admitting: Nurse Practitioner

## 2022-09-10 DIAGNOSIS — Z923 Personal history of irradiation: Secondary | ICD-10-CM | POA: Insufficient documentation

## 2022-09-10 DIAGNOSIS — C7951 Secondary malignant neoplasm of bone: Secondary | ICD-10-CM | POA: Diagnosis not present

## 2022-09-10 DIAGNOSIS — Z515 Encounter for palliative care: Secondary | ICD-10-CM | POA: Diagnosis not present

## 2022-09-10 DIAGNOSIS — Z9011 Acquired absence of right breast and nipple: Secondary | ICD-10-CM | POA: Diagnosis not present

## 2022-09-10 DIAGNOSIS — Z170 Estrogen receptor positive status [ER+]: Secondary | ICD-10-CM | POA: Diagnosis not present

## 2022-09-10 DIAGNOSIS — R530 Neoplastic (malignant) related fatigue: Secondary | ICD-10-CM

## 2022-09-10 DIAGNOSIS — Z7189 Other specified counseling: Secondary | ICD-10-CM | POA: Diagnosis not present

## 2022-09-10 DIAGNOSIS — Z9221 Personal history of antineoplastic chemotherapy: Secondary | ICD-10-CM | POA: Insufficient documentation

## 2022-09-10 DIAGNOSIS — C50411 Malignant neoplasm of upper-outer quadrant of right female breast: Secondary | ICD-10-CM | POA: Insufficient documentation

## 2022-09-10 NOTE — Progress Notes (Signed)
[facility]  [phone] [phone]   Name: Misty Arnold Date: 09/10/2022 MRN: [id number]  DOB: [birth year]  Patient Care Team: Nicholas Lose, MD as PCP - General (Hematology and Oncology)   I connected with Misty Arnold on 09/10/22 at 11:00 AM EST by phone and verified that I am speaking with the correct person using two identifiers.   I discussed the limitations, risks, security and privacy concerns of performing an evaluation and management service by telemedicine and the availability of in-person appointments. I also discussed with the patient that there may be a patient responsible charge related to this service. The patient expressed understanding and agreed to proceed.   Other persons participating in the visit and their role in the encounter: [place], RN    Patient's location: Home   Provider's location: [place] HISTORY: Misty Arnold is a 51 y.o. female with right metastatic breast cancer s/p mastectomy, large right chest wound secondary to necrotic mass, weankess due to T9 vertebral compression s/p tumor resection and decompressive laminectomy, and hypertension. Patient did not receive treatment for some time after seeking care in [place]. Currently receiving Enhertu treatment. Palliative following for symptom management and goals of care.   SOCIAL HISTORY:     reports that she has never smoked. She has never used smokeless tobacco. She reports that she does not currently use alcohol. She reports that she does not use drugs.  ADVANCE DIRECTIVES:  Advance directives completed during recent hospitalization 08/27/2021.  Documents are on file and have been reviewed personally and [name].  CODE STATUS: Full code  PAST MEDICAL HISTORY: Past Medical History:  Diagnosis Date   Abnormal uterine bleeding    Anemia    Breast cancer ([place])    Depression    Fibroid    Hormone disorder     Hypertension    Infertility, female    ALLERGIES:  is allergic to amoxicillin, penicillins, pork-derived products, penicillin g, and tamoxifen.  MEDICATIONS:  Current Outpatient Medications  Medication Sig Dispense Refill   acetaminophen (TYLENOL) 500 MG tablet Take 2 tablets (1,000 mg total) by mouth every 6 (six) hours as needed for fever.     Ascorbic Acid (VITAMIN C PO) Take 1,000 mg by mouth daily.     atenolol (TENORMIN) 50 MG tablet Take 1 tablet (50 mg total) by mouth 2 (two) times daily. 180 tablet 3   Cholecalciferol (VITAMIN D3) 125 MCG (5000 UT) CAPS Take 1 capsule (5,000 Units total) by mouth daily. 30 capsule 3   lip balm (CARMEX) ointment Apply 1 application topically as needed for lip care (cold sores). 7 g 0   metroNIDAZOLE (METROGEL) 0.75 % gel Apply topically 2 (two) times daily. 45 g 1   Multiple Vitamins-Minerals (MULTIVITAMIN WITH MINERALS) tablet FLORADIX Supplement-Iron +minerals     Nutritional Supplements (,FEEDING SUPPLEMENT, PROSOURCE PLUS) liquid Take 30 mLs by mouth 4 (four) times daily - after meals and at bedtime.     ondansetron (ZOFRAN) 8 MG tablet Take by mouth.     polyethylene glycol (MIRALAX / GLYCOLAX) 17 g packet Take 17 g by mouth daily. 30 each 0   potassium chloride SA (KLOR-CON M) 20 MEQ tablet Take 1 tablet by mouth daily. 30 tablet 3   Silver Hydrogel GEL Apply to wound bed as directed with each dressing change. 44.4 mL 0   No current facility-administered medications for this visit.    VITAL SIGNS: There were no  vitals taken for this visit. There were no vitals filed for this visit.   Estimated body mass index is 23.83 kg/m as calculated from the following:   Height as of 07/17/22: '5\' 5"'$  (1.651 m).   Weight as of 07/17/22: 143 lb 3.2 oz (65 kg).  PERFORMANCE STATUS (ECOG) : 0 - Asymptomatic   IMPRESSION:  I spoke with Misty Arnold by phone for follow-up.  No acute distress. Is doing well.  States she has been in the house mainly due to  change in pressure and atmosphere.  Also recently getting over an ear infection.  Says she is currently looking to purchase a dehumidifier.  Appetite continues to do well.  She is no longer having to drink Ensure daily.  No symptom management needs at this time.   Denies pain, constipation, diarrhea, nausea, or vomiting.   Patient knows to contact our office as needed. We will continue to follow and be available. I am glad she is much improved.     PLAN: Much improved. Pain is resolved. Occasional Tylenol use for minor aches and headaches. Appetite much improved. Weight stable.  Right breast wound healing.  I will plan to see her back in 8 weeks.  Patient expressed understanding and was in agreement with this plan. She also understands that She can call the clinic at any time with any questions, concerns, or complaints.   Time Total: 35 min   Visit consisted of counseling and education dealing with the complex and emotionally intense issues of symptom management and palliative care in the setting of serious and potentially life-threatening illness.Greater than 50%  of this time was spent counseling and coordinating care related to the above assessment and plan.  [name], AGPCNP-BC  Palliative Medicine Team/[place]

## 2022-09-17 ENCOUNTER — Telehealth: Payer: Self-pay | Admitting: Hematology and Oncology

## 2022-09-17 NOTE — Telephone Encounter (Signed)
Called patient per 10/20 staff message. At this time, the patient does not wish to schedule any follow up appointments. Patient stated that she will call and schedule a follow up appointment.

## 2022-10-12 ENCOUNTER — Other Ambulatory Visit: Payer: Self-pay | Admitting: *Deleted

## 2022-10-12 DIAGNOSIS — R530 Neoplastic (malignant) related fatigue: Secondary | ICD-10-CM

## 2022-10-15 ENCOUNTER — Ambulatory Visit (HOSPITAL_COMMUNITY)
Admission: RE | Admit: 2022-10-15 | Discharge: 2022-10-15 | Disposition: A | Discharge status: Home / Self Care | Payer: Medicare PPO | Source: Ambulatory Visit | Attending: Hematology and Oncology | Admitting: Hematology and Oncology

## 2022-10-15 ENCOUNTER — Inpatient Hospital Stay: Payer: Medicare PPO | Attending: Hematology and Oncology

## 2022-10-15 DIAGNOSIS — Z79899 Other long term (current) drug therapy: Secondary | ICD-10-CM | POA: Insufficient documentation

## 2022-10-15 DIAGNOSIS — Z170 Estrogen receptor positive status [ER+]: Secondary | ICD-10-CM | POA: Insufficient documentation

## 2022-10-15 DIAGNOSIS — C50919 Malignant neoplasm of unspecified site of unspecified female breast: Secondary | ICD-10-CM | POA: Diagnosis present

## 2022-10-15 DIAGNOSIS — Z9011 Acquired absence of right breast and nipple: Secondary | ICD-10-CM | POA: Insufficient documentation

## 2022-10-15 DIAGNOSIS — C50411 Malignant neoplasm of upper-outer quadrant of right female breast: Secondary | ICD-10-CM | POA: Diagnosis present

## 2022-10-15 DIAGNOSIS — R530 Neoplastic (malignant) related fatigue: Secondary | ICD-10-CM

## 2022-10-15 DIAGNOSIS — C7949 Secondary malignant neoplasm of other parts of nervous system: Secondary | ICD-10-CM | POA: Diagnosis not present

## 2022-10-15 DIAGNOSIS — Z9221 Personal history of antineoplastic chemotherapy: Secondary | ICD-10-CM | POA: Insufficient documentation

## 2022-10-15 DIAGNOSIS — Z79811 Long term (current) use of aromatase inhibitors: Secondary | ICD-10-CM | POA: Insufficient documentation

## 2022-10-15 DIAGNOSIS — Z923 Personal history of irradiation: Secondary | ICD-10-CM | POA: Insufficient documentation

## 2022-10-15 DIAGNOSIS — C7951 Secondary malignant neoplasm of bone: Secondary | ICD-10-CM | POA: Insufficient documentation

## 2022-10-15 LAB — CBC WITH DIFFERENTIAL (CANCER CENTER ONLY)
Abs Immature Granulocytes: 0.01 10*3/uL (ref 0.00–0.07)
Basophils Absolute: 0 10*3/uL (ref 0.0–0.1)
Basophils Relative: 1 %
Eosinophils Absolute: 0.1 10*3/uL (ref 0.0–0.5)
Eosinophils Relative: 1 %
HCT: 43.7 % (ref 36.0–46.0)
Hemoglobin: 15.1 g/dL — ABNORMAL HIGH (ref 12.0–15.0)
Immature Granulocytes: 0 %
Lymphocytes Relative: 25 %
Lymphs Abs: 1.1 10*3/uL (ref 0.7–4.0)
MCH: 31.5 pg (ref 26.0–34.0)
MCHC: 34.6 g/dL (ref 30.0–36.0)
MCV: 91 fL (ref 80.0–100.0)
Monocytes Absolute: 0.3 10*3/uL (ref 0.1–1.0)
Monocytes Relative: 7 %
Neutro Abs: 2.7 10*3/uL (ref 1.7–7.7)
Neutrophils Relative %: 66 %
Platelet Count: 357 10*3/uL (ref 150–400)
RBC: 4.8 MIL/uL (ref 3.87–5.11)
RDW: 11.8 % (ref 11.5–15.5)
WBC Count: 4.2 10*3/uL (ref 4.0–10.5)
nRBC: 0 % (ref 0.0–0.2)

## 2022-10-15 LAB — CMP (CANCER CENTER ONLY)
ALT: 11 U/L (ref 0–44)
AST: 19 U/L (ref 15–41)
Albumin: 4.3 g/dL (ref 3.5–5.0)
Alkaline Phosphatase: 150 U/L — ABNORMAL HIGH (ref 38–126)
Anion gap: 8 (ref 5–15)
BUN: 10 mg/dL (ref 6–20)
CO2: 29 mmol/L (ref 22–32)
Calcium: 10.1 mg/dL (ref 8.9–10.3)
Chloride: 103 mmol/L (ref 98–111)
Creatinine: 0.8 mg/dL (ref 0.44–1.00)
GFR, Estimated: >60 mL/min (ref >60)
Glucose, Bld: 94 mg/dL (ref 70–99)
Potassium: 3.6 mmol/L (ref 3.5–5.1)
Sodium: 140 mmol/L (ref 135–145)
Total Bilirubin: 0.4 mg/dL (ref 0.3–1.2)
Total Protein: 7.7 g/dL (ref 6.5–8.1)

## 2022-10-15 MED ORDER — IOHEXOL 300 MG/ML  SOLN
100.0000 mL | Freq: Once | INTRAMUSCULAR | Status: AC | PRN
  Administered 2022-10-15: 100 mL via INTRAVENOUS

## 2022-10-17 NOTE — Progress Notes (Signed)
 Patient Care Team: Arnold, Vinay, MD as PCP - General (Hematology and Oncology)  DIAGNOSIS: No diagnosis found.  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; [facility] and delayed therapy   01/26/2011 Surgery   right mastectomy followed by reconstruction: Invasive ductal carcinoma T1 N1 MIC M0 stage IB ER/PR positive HER-2 negative, BRCA negative, Oncotype DX low risk   05/29/2012 Procedure   right chest wall nodule excision done on 06/24/2012 showed metastatic carcinoma margins were positive, but CT scan no metastatic disease, recommended chemotherapy but patient refused also refused reexcision   04/28/2013 Treatment Plan Change   patient went to [place] for alternative treatments and took herbal medications, tonics etc. But she could not afford these trips.   01/08/2014 Breast MRI   right breast multiple enhancing masses within the soft tissues largest 5.6 cm in wall skin surface and the capsule of the silicone prosthesis, contiguous nodules involving in inferomedial breast and tired and subcentimeter nodules across the midline   08/29/2015 Surgery   Right mastectomy: IDC with invol of skin and skin ulceration, breast capsule inv cancer, superior medial margin positive, 1/1 LN positive, grade 3, 14.3 cm, 3.5 cm, ALI, chest wall involv, ER 90-100%, PR 80-90%, HER-2 negative, Ki 67 40-50% T4CN1 (St 3B)   03/05/2016 -  Anti-estrogen oral therapy   Tamoxifen 20 mg daily stopped due to headache and uncontrolled hypertension. Decrease to 10 mg daily 04/03/2016, stopped June 2017 and took an estrogen metabolizer  over-the-counter; started Aromasin April 2018 from a [place] physician, Zoladex started on 05/2017 and switched to Letrozole in 09/2017   01/04/2020 Surgery   Patient presented to the ED on 01/04/20 for worsening lower extremity numbness and underwent a decompressive laminectomy with tumor resection at T9 that was complicated with acute blood loss anemia and leukocytosis.   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)  01/04/2020 Initial Diagnosis   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)   10/17/2021 - 06/06/2022 Chemotherapy   Patient is on Treatment Plan : BREAST METASTATIC fam-trastuzumab deruxtecan-nxki (Enhertu) q21d     10/17/2021 -  Chemotherapy   Patient is on Treatment Plan : BREAST METASTATIC Fam-Trastuzumab Deruxtecan-nxki (Enhertu) (5.4) q21d     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)  01/08/2020 Initial Diagnosis   Metastatic breast cancer (HCC)   10/17/2021 - 06/06/2022 Chemotherapy   Patient is on Treatment Plan : BREAST METASTATIC fam-trastuzumab deruxtecan-nxki (Enhertu) q21d     10/17/2021 -  Chemotherapy   Patient is on Treatment Plan : BREAST METASTATIC Fam-Trastuzumab Deruxtecan-nxki (Enhertu) (5.4) q21d         CHIEF COMPLIANT: Follow up on Enhertu   INTERVAL HISTORY: Misty Arnold is a 51 y.o. with above-mentioned history of metastatic breast cancer currently on chemotherapy with Enhertu. She presents to the  clinic today for follow-up.      ALLERGIES:  is allergic to amoxicillin, penicillins, pork-derived products, penicillin g, and tamoxifen.  MEDICATIONS:  Current Outpatient Medications  Medication Sig Dispense Refill   acetaminophen (TYLENOL) 500 MG tablet Take 2 tablets (1,000 mg total) by mouth every 6 (six) hours as needed for fever.     Ascorbic Acid (VITAMIN C PO) Take 1,000 mg by mouth daily.     atenolol (TENORMIN) 50 MG tablet Take 1 tablet (50 mg total) by mouth 2 (two) times daily. 180 tablet 3   Cholecalciferol (VITAMIN D3) 125 MCG (5000 UT) CAPS Take 1 capsule (5,000 Units total) by mouth daily. 30 capsule 3   lip balm (CARMEX) ointment Apply 1 application topically as needed for lip care (cold sores). 7 g 0   metroNIDAZOLE (METROGEL) 0.75 % gel Apply topically 2 (two) times daily. 45 g 1   Multiple Vitamins-Minerals (MULTIVITAMIN WITH MINERALS) tablet FLORADIX Supplement-Iron +minerals     Nutritional Supplements (,FEEDING SUPPLEMENT, PROSOURCE PLUS) liquid Take 30 mLs by mouth 4 (four) times daily - after meals and at bedtime.     ondansetron (ZOFRAN) 8 MG tablet Take by mouth.     polyethylene glycol (MIRALAX / GLYCOLAX) 17 g packet Take 17 g by mouth daily. 30 each 0   potassium chloride SA (KLOR-CON M) 20 MEQ tablet Take 1 tablet by mouth daily. 30 tablet 3   Silver Hydrogel GEL Apply to wound bed as directed with each dressing change. 44.4 mL 0   No current facility-administered medications for this visit.    PHYSICAL EXAMINATION: ECOG PERFORMANCE STATUS: {CHL ONC ECOG PS:[phone]}  There were no vitals filed for this visit. There were no vitals filed for this visit.  BREAST:*** No palpable masses or nodules in either right or left breasts. No palpable axillary supraclavicular or infraclavicular adenopathy no breast tenderness or nipple discharge. (exam performed in the presence of a chaperone)  LABORATORY DATA:  I have reviewed the data as listed    Latest Ref  Rng & Units 10/15/2022    4:02 PM 07/17/2022    8:05 AM 06/26/2022    7:59 AM  CMP  Glucose 70 - 99 mg/dL 94  158  118   BUN 6 - 20 mg/dL _0 Creatinine 0.44 - 1.00 mg/dL 0.80  0.89  0.84   Sodium 135 - 145 mmol/L 140  139  142   Potassium 3.5 - 5.1 mmol/L 3.6  3.4  3.2   Chloride 98 - 111 mmol/L 103  107  108   CO2 22 - 32 mmol/L _1 Calcium 8.9 - 10.3 mg/dL 10.1  9.2  9.9   Total Protein 6.5 - 8.1 g/dL 7.7  6.6  7.1   Total Bilirubin 0.3 - 1.2 mg/dL 0.4  0.4  0.5   Alkaline Phos 38 - 126 U/L 150  130  132   AST 15 - 41 U/L _2 ALT 0 - 44 U/L _3 Lab Results  Component Value Date   WBC 4.2 10/15/2022   HGB 15.1 (H) 10/15/2022   HCT 43.7 10/15/2022   MCV 91.0 10/15/2022   PLT 357 10/15/2022  NEUTROABS 2.7 10/15/2022    ASSESSMENT & PLAN:  No problem-specific Assessment & Plan notes found for this encounter.    No orders of the defined types were placed in this encounter.  The patient has a good understanding of the overall plan. she agrees with it. she will call with any problems that may develop before the next visit here. Total time spent: 30 mins including face to face time and time spent for planning, charting and co-ordination of care   Suzzette Righter, [place] 10/17/22    I Gardiner Coins am acting as a Education administrator for [employer]   ***

## 2022-10-18 ENCOUNTER — Inpatient Hospital Stay (HOSPITAL_BASED_OUTPATIENT_CLINIC_OR_DEPARTMENT_OTHER): Payer: Medicare PPO | Admitting: Hematology and Oncology

## 2022-10-18 ENCOUNTER — Other Ambulatory Visit: Payer: Self-pay

## 2022-10-18 ENCOUNTER — Other Ambulatory Visit (HOSPITAL_COMMUNITY): Payer: Self-pay

## 2022-10-18 VITALS — BP 183/93 | HR 78 | Temp 97.8°F | Resp 18 | Ht 65.0 in | Wt 141.8 lb

## 2022-10-18 DIAGNOSIS — Z9221 Personal history of antineoplastic chemotherapy: Secondary | ICD-10-CM | POA: Diagnosis not present

## 2022-10-18 DIAGNOSIS — C50411 Malignant neoplasm of upper-outer quadrant of right female breast: Secondary | ICD-10-CM | POA: Diagnosis not present

## 2022-10-18 DIAGNOSIS — Z79811 Long term (current) use of aromatase inhibitors: Secondary | ICD-10-CM | POA: Diagnosis not present

## 2022-10-18 DIAGNOSIS — Z9011 Acquired absence of right breast and nipple: Secondary | ICD-10-CM | POA: Diagnosis not present

## 2022-10-18 DIAGNOSIS — Z923 Personal history of irradiation: Secondary | ICD-10-CM | POA: Diagnosis not present

## 2022-10-18 DIAGNOSIS — C7951 Secondary malignant neoplasm of bone: Secondary | ICD-10-CM | POA: Diagnosis not present

## 2022-10-18 DIAGNOSIS — Z79899 Other long term (current) drug therapy: Secondary | ICD-10-CM | POA: Diagnosis not present

## 2022-10-18 DIAGNOSIS — Z170 Estrogen receptor positive status [ER+]: Secondary | ICD-10-CM

## 2022-10-18 MED ORDER — CELECOXIB 200 MG PO CAPS
200.0000 mg | ORAL_CAPSULE | Freq: Every day | ORAL | 3 refills | Status: DC
  Filled 2022-10-18: qty 30, 30d supply, fill #0

## 2022-10-18 MED ORDER — LEVOFLOXACIN 750 MG PO TABS
750.0000 mg | ORAL_TABLET | Freq: Every day | ORAL | 0 refills | Status: DC
  Filled 2022-10-18: qty 10, 10d supply, fill #0

## 2022-10-18 NOTE — Assessment & Plan Note (Signed)
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   Treatment summary: 1. Right mastectomy 08/29/2015 at [facility] (Dr.Singh): IDC with invol of skin and skin ulceration, breast capsule inv cancer, superior medial margin positive, 1/1 LN positive, grade 3, 14.3 cm, 3.5 cm, ALI, chest wall involv, ER 90-100%, PR 80-90%, HER-2 negative, Ki 67 40-50% T4CN1 (St 3B) 2. Patient started tamoxifen but developed severe headache with severe hypertension and tamoxifen was discontinued 03/17/2016.  3. 02/2017 started on Exemestane, Zoladex started 05/29/2017 4.  Hospitalization for paraplegia: T9 vertebral body compression status post decompressive laminectomy and tumor resection 01/04/2020 5.  Palliative radiation to the thoracic spine 01/28/2020 6.  Ibrance with letrozole started April 2021 discontinued 07/24/2021 7.  Enhertu started 10/17/2021 every 3 weeks  ----------------------------------------------------------------------------------------------------------------- 05/11/2021: CT angiogram: Metastatic breast cancer soft tissue breast masses greater on the right, axillary supraclavicular lymphadenopathy, multiple bone metastases    Hospitalization 08/25/2021-09/05/2021: Worsening right chest wall pain and infection (patient has appointments with palliative care as well as infectious disease)   Current treatment: Enhertu started 10/17/2021, today cycle 15 Toxicities: Tolerating it extremely well without any major problems.  CT CAP 10/16/2022: Markedly enlarged soft tissue nodule erupting from the posterior right eighth rib.  Interval enlargement of chest wall nodularity to the mastectomy site on the right with new rib invasion.  Soft tissue  metastases 2. to the right mastectomy site is similar in size.  Mild increase in the prevascular lymph node mediastinum thoracic spine decompression and fusion, interval enlargement of metastases in the inferior right hepatic lobe  Patient has not had Enhertu treatment since 07/17/2022. I recommended that we resume these treatments so that we can recover from the current evidence of progression on the CT scan.

## 2022-10-19 ENCOUNTER — Encounter: Payer: Self-pay | Admitting: *Deleted

## 2022-10-19 NOTE — Progress Notes (Signed)
Received call from [place] with [employer] disability services [phone]) requesting copy of recent office not.  RN successfully faxed requested documentation to fax number provided: [phone].

## 2022-10-24 ENCOUNTER — Telehealth: Payer: Self-pay

## 2022-10-24 NOTE — Telephone Encounter (Signed)
Pt called and LVM asking to r/s appt w/MD for sooner than 1/2. Attempted to call pt back to advise MD does not have availability. She agreed to keep appt as scheduled.

## 2022-11-02 MED FILL — Dexamethasone Sodium Phosphate Inj 100 MG/10ML: INTRAMUSCULAR | Qty: 1 | Status: AC

## 2022-11-06 ENCOUNTER — Inpatient Hospital Stay: Payer: Medicare PPO

## 2022-11-06 ENCOUNTER — Encounter: Payer: Self-pay | Admitting: Nurse Practitioner

## 2022-11-06 ENCOUNTER — Other Ambulatory Visit: Payer: Self-pay

## 2022-11-06 ENCOUNTER — Inpatient Hospital Stay: Payer: Medicare PPO | Attending: Hematology and Oncology | Admitting: Nurse Practitioner

## 2022-11-06 ENCOUNTER — Inpatient Hospital Stay (HOSPITAL_BASED_OUTPATIENT_CLINIC_OR_DEPARTMENT_OTHER): Payer: Medicare PPO | Admitting: Physician Assistant

## 2022-11-06 VITALS — BP 163/100 | HR 87 | Temp 98.2°F | Resp 16 | Wt 138.2 lb

## 2022-11-06 VITALS — BP 153/101 | HR 86

## 2022-11-06 DIAGNOSIS — G8220 Paraplegia, unspecified: Secondary | ICD-10-CM | POA: Diagnosis not present

## 2022-11-06 DIAGNOSIS — C50411 Malignant neoplasm of upper-outer quadrant of right female breast: Secondary | ICD-10-CM | POA: Diagnosis not present

## 2022-11-06 DIAGNOSIS — C7951 Secondary malignant neoplasm of bone: Secondary | ICD-10-CM | POA: Diagnosis not present

## 2022-11-06 DIAGNOSIS — Z9011 Acquired absence of right breast and nipple: Secondary | ICD-10-CM | POA: Insufficient documentation

## 2022-11-06 DIAGNOSIS — Z79899 Other long term (current) drug therapy: Secondary | ICD-10-CM | POA: Insufficient documentation

## 2022-11-06 DIAGNOSIS — Z5111 Encounter for antineoplastic chemotherapy: Secondary | ICD-10-CM | POA: Insufficient documentation

## 2022-11-06 DIAGNOSIS — C50919 Malignant neoplasm of unspecified site of unspecified female breast: Secondary | ICD-10-CM

## 2022-11-06 DIAGNOSIS — Z170 Estrogen receptor positive status [ER+]: Secondary | ICD-10-CM | POA: Diagnosis not present

## 2022-11-06 DIAGNOSIS — C7949 Secondary malignant neoplasm of other parts of nervous system: Secondary | ICD-10-CM

## 2022-11-06 DIAGNOSIS — Z7189 Other specified counseling: Secondary | ICD-10-CM

## 2022-11-06 DIAGNOSIS — Z923 Personal history of irradiation: Secondary | ICD-10-CM | POA: Diagnosis not present

## 2022-11-06 DIAGNOSIS — Z515 Encounter for palliative care: Secondary | ICD-10-CM

## 2022-11-06 DIAGNOSIS — R530 Neoplastic (malignant) related fatigue: Secondary | ICD-10-CM

## 2022-11-06 LAB — CBC WITH DIFFERENTIAL (CANCER CENTER ONLY)
Abs Immature Granulocytes: 0.01 10*3/uL (ref 0.00–0.07)
Basophils Absolute: 0 10*3/uL (ref 0.0–0.1)
Basophils Relative: 0 %
Eosinophils Absolute: 0 10*3/uL (ref 0.0–0.5)
Eosinophils Relative: 0 %
HCT: 42.3 % (ref 36.0–46.0)
Hemoglobin: 14.3 g/dL (ref 12.0–15.0)
Immature Granulocytes: 0 %
Lymphocytes Relative: 18 %
Lymphs Abs: 0.9 10*3/uL (ref 0.7–4.0)
MCH: 30.2 pg (ref 26.0–34.0)
MCHC: 33.8 g/dL (ref 30.0–36.0)
MCV: 89.2 fL (ref 80.0–100.0)
Monocytes Absolute: 0.3 10*3/uL (ref 0.1–1.0)
Monocytes Relative: 5 %
Neutro Abs: 3.9 10*3/uL (ref 1.7–7.7)
Neutrophils Relative %: 77 %
Platelet Count: 237 10*3/uL (ref 150–400)
RBC: 4.74 MIL/uL (ref 3.87–5.11)
RDW: 12 % (ref 11.5–15.5)
WBC Count: 5.1 10*3/uL (ref 4.0–10.5)
nRBC: 0 % (ref 0.0–0.2)

## 2022-11-06 LAB — CMP (CANCER CENTER ONLY)
ALT: 11 U/L (ref 0–44)
AST: 18 U/L (ref 15–41)
Albumin: 4 g/dL (ref 3.5–5.0)
Alkaline Phosphatase: 118 U/L (ref 38–126)
Anion gap: 7 (ref 5–15)
BUN: 8 mg/dL (ref 6–20)
CO2: 30 mmol/L (ref 22–32)
Calcium: 10 mg/dL (ref 8.9–10.3)
Chloride: 103 mmol/L (ref 98–111)
Creatinine: 0.77 mg/dL (ref 0.44–1.00)
GFR, Estimated: >60 mL/min (ref >60)
Glucose, Bld: 123 mg/dL — ABNORMAL HIGH (ref 70–99)
Potassium: 3.3 mmol/L — ABNORMAL LOW (ref 3.5–5.1)
Sodium: 140 mmol/L (ref 135–145)
Total Bilirubin: 0.5 mg/dL (ref 0.3–1.2)
Total Protein: 7.4 g/dL (ref 6.5–8.1)

## 2022-11-06 MED ORDER — SODIUM CHLORIDE 0.9 % IV SOLN
10.0000 mg | Freq: Once | INTRAVENOUS | Status: AC
  Administered 2022-11-06: 10 mg via INTRAVENOUS
  Filled 2022-11-06: qty 10
  Filled 2022-11-06: qty 1

## 2022-11-06 MED ORDER — PALONOSETRON HCL INJECTION 0.25 MG/5ML
0.2500 mg | Freq: Once | INTRAVENOUS | Status: AC
  Administered 2022-11-06: 0.25 mg via INTRAVENOUS
  Filled 2022-11-06: qty 5

## 2022-11-06 MED ORDER — ACETAMINOPHEN 325 MG PO TABS
650.0000 mg | ORAL_TABLET | Freq: Once | ORAL | Status: AC
  Administered 2022-11-06: 650 mg via ORAL
  Filled 2022-11-06: qty 2

## 2022-11-06 MED ORDER — FAM-TRASTUZUMAB DERUXTECAN-NXKI CHEMO 100 MG IV SOLR
5.4000 mg/kg | Freq: Once | INTRAVENOUS | Status: AC
  Administered 2022-11-06: 342 mg via INTRAVENOUS
  Filled 2022-11-06: qty 17.1

## 2022-11-06 MED ORDER — DEXTROSE 5 % IV SOLN: Freq: Once | INTRAVENOUS | Status: AC

## 2022-11-06 MED ORDER — DIPHENHYDRAMINE HCL 25 MG PO CAPS
50.0000 mg | ORAL_CAPSULE | Freq: Once | ORAL | Status: AC
  Administered 2022-11-06: 50 mg via ORAL
  Filled 2022-11-06: qty 2

## 2022-11-06 NOTE — Patient Instructions (Signed)
[facility] ONCOL[facility]  Discharge Instructions: Thank you for choosing [facility] to provide your oncology and hematology care.   If you have a lab appointment with the [facility], please go directly to the [facility] and check in at the registration area.   Wear comfortable clothing and clothing appropriate for easy access to any Portacath or PICC line.   We strive to give you quality time with your provider. You may need to reschedule your appointment if you arrive late (15 or more minutes).  Arriving late affects you and other patients whose appointments are after yours.  Also, if you miss three or more appointments without notifying the office, you may be dismissed from the clinic at the provider's discretion.      For prescription refill requests, have your pharmacy contact our office and allow 72 hours for refills to be completed.    Today you received the following chemotherapy and/or immunotherapy agents Enhertu      To help prevent nausea and vomiting after your treatment, we encourage you to take your nausea medication as directed.  BELOW ARE SYMPTOMS THAT SHOULD BE REPORTED IMMEDIATELY: *FEVER GREATER THAN 100.4 F (38 C) OR HIGHER *CHILLS OR SWEATING *NAUSEA AND VOMITING THAT IS NOT CONTROLLED WITH YOUR NAUSEA MEDICATION *UNUSUAL SHORTNESS OF BREATH *UNUSUAL BRUISING OR BLEEDING *URINARY PROBLEMS (pain or burning when urinating, or frequent urination) *BOWEL PROBLEMS (unusual diarrhea, constipation, pain near the anus) TENDERNESS IN MOUTH AND THROAT WITH OR WITHOUT PRESENCE OF ULCERS (sore throat, sores in mouth, or a toothache) UNUSUAL RASH, SWELLING OR PAIN  UNUSUAL VAGINAL DISCHARGE OR ITCHING   Items with * indicate a potential emergency and should be followed up as soon as possible or go to the Emergency Department if any problems should occur.  Please show the CHEMOTHERAPY ALERT CARD or IMMUNOTHERAPY ALERT CARD at check-in to the  Emergency Department and triage nurse.  Should you have questions after your visit or need to cancel or reschedule your appointment, please contact [facility]  Dept: [phone]  and follow the prompts.  Office hours are 8:00 a.m. to 4:30 p.m. Monday - Friday. Please note that voicemails left after 4:00 p.m. may not be returned until the following business day.  We are closed weekends and major holidays. You have access to a nurse at all times for urgent questions. Please call the main number to the clinic Dept: [phone] and follow the prompts.   For any non-urgent questions, you may also contact your provider using MyChart. We now offer e-Visits for anyone 31 and older to request care online for non-urgent symptoms. For details visit [email or web address].   Also download the MyChart app! Go to the app store, search "MyChart", open the app, select [facility], and log in with your MyChart username and password.

## 2022-11-06 NOTE — Progress Notes (Signed)
[facility]  Telephone:([phone]) [phone] [phone]) [phone]   Name: Misty Arnold Date: 11/06/2022 MRN: [id number]  DOB: 1971/04/11  Patient Care Team: Nicholas Lose, MD as PCP - General (Hematology and Oncology)    INTERVAL HISTORY: Misty Arnold is a 52 y.o. female with right metastatic breast cancer s/p mastectomy, large right chest wound secondary to necrotic mass, weankess due to T9 vertebral compression s/p tumor resection and decompressive laminectomy, and hypertension. Patient did not receive treatment for some time after seeking care in [place]. Currently receiving Enhertu treatment. Palliative following for symptom management and goals of care.   SOCIAL HISTORY:     reports that she has never smoked. She has never used smokeless tobacco. She reports that she does not currently use alcohol. She reports that she does not use drugs.  ADVANCE DIRECTIVES:  Advance directives completed during recent hospitalization 08/27/2021.  Documents are on file and have been reviewed personally and [name].  CODE STATUS: Full code  PAST MEDICAL HISTORY: Past Medical History:  Diagnosis Date   Abnormal uterine bleeding    Anemia    Breast cancer ([facility])    Depression    Fibroid    Hormone disorder    Hypertension    Infertility, female    ALLERGIES:  is allergic to amoxicillin, penicillins, pork-derived products, penicillin g, and tamoxifen.  MEDICATIONS:  Current Outpatient Medications  Medication Sig Dispense Refill   acetaminophen (TYLENOL) 500 MG tablet Take 2 tablets (1,000 mg total) by mouth every 6 (six) hours as needed for fever.     Ascorbic Acid (VITAMIN C PO) Take 1,000 mg by mouth daily.     atenolol (TENORMIN) 50 MG tablet Take 1 tablet (50 mg total) by mouth 2 (two) times daily. 180 tablet 3   celecoxib (CELEBREX) 200 MG capsule Take 1 capsule (200 mg total) by mouth at bedtime. 30 capsule 3   Cholecalciferol (VITAMIN D3) 125 MCG  (5000 UT) CAPS Take 1 capsule (5,000 Units total) by mouth daily. 30 capsule 3   levofloxacin (LEVAQUIN) 750 MG tablet Take 1 tablet (750 mg total) by mouth daily for 10 days. 10 tablet 0   lip balm (CARMEX) ointment Apply 1 application topically as needed for lip care (cold sores). 7 g 0   metroNIDAZOLE (METROGEL) 0.75 % gel Apply topically 2 (two) times daily. 45 g 1   Multiple Vitamins-Minerals (MULTIVITAMIN WITH MINERALS) tablet FLORADIX Supplement-Iron +minerals     Nutritional Supplements (,FEEDING SUPPLEMENT, PROSOURCE PLUS) liquid Take 30 mLs by mouth 4 (four) times daily - after meals and at bedtime.     ondansetron (ZOFRAN) 8 MG tablet Take by mouth.     polyethylene glycol (MIRALAX / GLYCOLAX) 17 g packet Take 17 g by mouth daily. 30 each 0   potassium chloride SA (KLOR-CON M) 20 MEQ tablet Take 1 tablet by mouth daily. 30 tablet 3   Silver Hydrogel GEL Apply to wound bed as directed with each dressing change. 44.4 mL 0   No current facility-administered medications for this visit.    VITAL SIGNS: There were no vitals taken for this visit. There were no vitals filed for this visit.   Estimated body mass index is 23.6 kg/m as calculated from the following:   Height as of 10/18/22: '5\' 5"'$  (1.651 m).   Weight as of 10/18/22: 141 lb 12.8 oz (64.3 kg).  PERFORMANCE STATUS (ECOG) : 0 - Asymptomatic   IMPRESSION:  I saw Ms. Misty Arnold today during  her infusion.  No acute distress.  Continues to do well.  States she has an area on her right side that continues to ooze at times.  She is dressing with ABD pads as needed.  Denies uncontrolled pain, constipation, diarrhea, nausea, or vomiting.  Appetite continues to be good.  Does not require daily nutritional supplements given increase in appetite.  Ms. Wilcock continues to remain as active as possible.  States she had a good holiday.  No symptom management needs at this time.  Patient knows to contact our office as needed. We will continue  to follow and be available. I am glad she is much improved.   PLAN: Much improved. Pain is resolved. Occasional Tylenol use for minor aches and headaches. Appetite much improved. Weight stable.  Right breast wound healing.  I will plan to see her back in 2-3 weeks.  Patient expressed understanding and was in agreement with this plan. She also understands that She can call the clinic at any time with any questions, concerns, or complaints.     Time Total: 15 min  Visit consisted of counseling and education dealing with the complex and emotionally intense issues of symptom management and palliative care in the setting of serious and potentially life-threatening illness.Greater than 50%  of this time was spent counseling and coordinating care related to the above assessment and plan.  [name], AGPCNP-BC  Palliative Medicine Team/[facility]

## 2022-11-06 NOTE — Progress Notes (Signed)
Patient Care Team: Misty Lose, MD as PCP - General (Hematology and Oncology)  SUMMARY OF ONCOLOGIC HISTORY: Oncology History  Breast cancer of upper-outer quadrant of right female breast ([place])  09/21/2010 Mammogram   right breast linear and segmental pleomorphic calcifications from 4:00 to 6:00 position ultrasound revealed 1.6 cm and 1.1 cm masses   09/27/2010 Initial Biopsy   ultrasound-guided biopsy of all masses showed DCIS grade 2; patient went to cancer treatment centers of [place] for second opinion and delayed therapy   01/26/2011 Surgery   right mastectomy followed by reconstruction: 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 But she could not afford these trips.   01/08/2014 Breast MRI   right breast multiple enhancing masses within the soft tissues largest 5.6 cm in wall skin surface and the capsule of the silicone prosthesis, contiguous nodules involving in inferomedial breast and tired and subcentimeter nodules across the midline   08/29/2015 Surgery   Right mastectomy: IDC with invol of skin and skin ulceration, breast capsule inv cancer, superior medial margin positive, 1/1 LN positive, grade 3, 14.3 cm, 3.5 cm, ALI, chest wall involv, ER 90-100%, PR 80-90%, HER-2 negative, Ki 67 40-50% T4CN1 (St 3B)   03/05/2016 -  Anti-estrogen oral therapy   Tamoxifen 20 mg daily stopped due to headache and uncontrolled hypertension. Decrease to 10 mg daily 04/03/2016, stopped June 2017 and took an estrogen metabolizer over-the-counter; started Aromasin April 2018  from a [place] physician, Zoladex started on 05/2017 and switched to Letrozole in 09/2017   01/04/2020 Surgery   Patient presented to the ED on 01/04/20 for worsening lower extremity numbness and underwent a decompressive laminectomy with tumor resection at T9 that was complicated with acute blood loss anemia and leukocytosis.   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[place])  01/04/2020 Initial Diagnosis   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[place])   10/17/2021 - 06/06/2022 Chemotherapy   Patient is on Treatment Plan : BREAST METASTATIC fam-trastuzumab deruxtecan-nxki (Enhertu) q21d     10/17/2021 -  Chemotherapy   Patient is on Treatment Plan : BREAST METASTATIC Fam-Trastuzumab Deruxtecan-nxki (Enhertu) (5.4) q21d     Primary malignant neoplasm of breast with metastasis ([place])  01/08/2020 Initial Diagnosis   Metastatic breast cancer ([place])   10/17/2021 - 06/06/2022 Chemotherapy   Patient is on Treatment Plan : BREAST METASTATIC fam-trastuzumab deruxtecan-nxki (Enhertu) q21d     10/17/2021 -  Chemotherapy   Patient is on Treatment Plan : BREAST METASTATIC Fam-Trastuzumab Deruxtecan-nxki (Enhertu) (5.4) q21d       CHIEF COMPLIANT: Follow up of  metastatic breast cancer  INTERVAL HISTORY: Gaelyn Tukes returns for a follow up for metastatic breast cancer. She is here to resume Enhertu therapy after a treatment break due to ongoing ear infection.   Ms. Soule is  unaccompanied for this visit. She reports her ear infection has resolved after completing antibiotic therapy last week. She reports stable chest wall wound that she changes the dressing twice a day. She denies any pain to the wound unless she picks up something heavy. She reports her energy levels are stable and she is able to complete most of her ADLs on her own. She denies any appetite changes and weight is overall stable. She denies nausea, vomiting or abdominal pain. Her bowel habits are unchanged without recurrent episodes of diarrhea or constipation. She denies fevers, chills, sweats, shortness of breath, chest pain or cough. She has no other complaints.   Rest of the ROS was reviewed and negative.   ALLERGIES:  is allergic to amoxicillin, penicillins, pork-derived products, penicillin g, and tamoxifen.  MEDICATIONS:  Current Outpatient Medications  Medication Sig Dispense Refill   acetaminophen (TYLENOL) 500 MG tablet Take 2 tablets (1,000 mg total) by mouth every 6 (six) hours as needed for fever.     Ascorbic Acid (VITAMIN C PO) Take 1,000 mg by mouth daily.     atenolol (TENORMIN) 50 MG tablet Take 1 tablet (50 mg total) by mouth 2 (two) times daily. 180 tablet 3   celecoxib (CELEBREX) 200 MG capsule Take 1 capsule (200 mg total) by mouth at bedtime. 30 capsule 3   Cholecalciferol (VITAMIN D3) 125 MCG (5000 UT) CAPS Take 1 capsule (5,000 Units total) by mouth daily. 30 capsule 3   levofloxacin (LEVAQUIN) 750 MG tablet Take 1 tablet (750 mg total) by mouth daily for 10 days. 10 tablet 0   lip balm (CARMEX) ointment Apply 1 application topically as needed for lip care (cold sores). 7 g 0   metroNIDAZOLE (METROGEL) 0.75 % gel Apply topically 2 (two) times daily. 45 g 1   Multiple Vitamins-Minerals (MULTIVITAMIN WITH MINERALS) tablet FLORADIX Supplement-Iron +minerals     Nutritional Supplements (,FEEDING SUPPLEMENT, PROSOURCE PLUS) liquid Take 30 mLs by mouth 4 (four) times daily - after  meals and at bedtime.     ondansetron (ZOFRAN) 8 MG tablet Take by mouth.     polyethylene glycol (MIRALAX / GLYCOLAX) 17 g packet Take 17 g by mouth daily. 30 each 0   potassium chloride SA (KLOR-CON M) 20 MEQ tablet Take 1 tablet by mouth daily. 30 tablet 3   Silver Hydrogel GEL Apply to wound bed as directed with each dressing change. 44.4 mL 0   No current facility-administered medications for this visit.    PHYSICAL EXAMINATION: ECOG PERFORMANCE STATUS: 1 - Symptomatic but completely ambulatory  Vitals:   11/06/22 1335  BP: (!) 163/100  Pulse: 87  Resp: 16  Temp: 98.2 F (36.8 C)  SpO2: 98%   Filed Weights   11/06/22 1335  Weight: 138 lb 3.2 oz (62.7 kg)   Constitutional: Oriented to person, place, and time and well-developed, well-nourished, and in no distress.  HENT:  Head: Normocephalic and atraumatic.  Eyes: Conjunctivae are normal. Right eye exhibits no discharge. Left eye exhibits no discharge. No scleral icterus.  Cardiovascular: Normal rate, regular rhythm, normal heart sounds Pulmonary/Chest: Effort normal and breath sounds normal. No respiratory distress. No wheezes. No rales.  Musculoskeletal: Normal range of motion. Exhibits no edema.  Neurological: Alert and oriented to person, place,  and time. Exhibits normal muscle tone. Gait normal. Coordination normal.  Skin: Skin is warm and dry. No rash noted. Right chest wall wound noted. No infectious findings including no evidence of purulent discharge or erythema.  Psychiatric: Mood, memory and judgment normal.    LABORATORY DATA:  I have reviewed the data as listed    Latest Ref Rng & Units 11/06/2022    1:13 PM 10/15/2022    4:02 PM 07/17/2022    8:05 AM  CMP  Glucose 70 - 99 mg/dL 123  94  158   BUN 6 - 20 mg/dL _0 Creatinine 0.44 - 1.00 mg/dL 0.77  0.80  0.89   Sodium 135 - 145 mmol/L 140  140  139   Potassium 3.5 - 5.1 mmol/L 3.3  3.6  3.4   Chloride 98 - 111 mmol/L 103  103  107   CO2 22 - 32  mmol/L _1 Calcium 8.9 - 10.3 mg/dL 10.0  10.1  9.2   Total Protein 6.5 - 8.1 g/dL 7.4  7.7  6.6   Total Bilirubin 0.3 - 1.2 mg/dL 0.5  0.4  0.4   Alkaline Phos 38 - 126 U/L 118  150  130   AST 15 - 41 U/L _2 ALT 0 - 44 U/L _3 Lab Results  Component Value Date   WBC 5.1 11/06/2022   HGB 14.3 11/06/2022   HCT 42.3 11/06/2022   MCV 89.2 11/06/2022   PLT 237 11/06/2022   NEUTROABS 3.9 11/06/2022    ASSESSMENT & PLAN:  Breast cancer of upper-outer quadrant of right female breast ([place]) Recurrent right breast cancer initially DCIS treated with mastectomy followed by reconstruction and later developed chest wall recurrence in 2013 ER/PR positive HER-2 negative, patient underwent alternative therapies in [place] but could not afford the trips and hence she has not been on any breast cancer therapy for a long time.   Treatment summary: 1. Right mastectomy 08/29/2015 at [facility] (Dr.Singh): IDC with invol of skin and skin ulceration, breast capsule inv cancer, superior medial margin positive, 1/1 LN positive, grade 3, 14.3 cm, 3.5 cm, ALI, chest wall involv, ER 90-100%, PR 80-90%, HER-2 negative, Ki 67 40-50% T4CN1 (St 3B) 2. Patient started tamoxifen but developed severe headache with severe hypertension and tamoxifen was discontinued 03/17/2016.  3. 02/2017 started on Exemestane, Zoladex started 05/29/2017 4.  Hospitalization for paraplegia: T9 vertebral body compression status post decompressive laminectomy and tumor resection 01/04/2020 5.  Palliative radiation to the thoracic spine 01/28/2020 6.  Ibrance with letrozole started April 2021 discontinued 07/24/2021 7.  Enhertu started 10/17/2021 every 3 weeks  -----------------------------------------------------------------------------------------------------------------  Patient presents to resume Enhertu therapy that she stopped in September 2023 since CT imaging from 10/16/2022 showed increase in metastatic disease  including soft tissue nodule erupting from the posterior right eighth rib. Labs from today were reviewed and adequate for treatment. Patient will proceed with treatment today without dose modifications.   Follow up: --Ordered repeat echocardiogram to monitor EF while on HER2 therapy. Last echo was from 06/19/2022 with EF 55-60%.  --Potassium level was 3.3 today. Patient takes PO Kcl only 3x/week. Recommend to take once daily as prescribed.  --RTC in 3 weeks for labs, follow up visit with Dr. Lindi Adie before next [facility] treatment   The patient has a good understanding of the overall plan. she agrees with  it. she will call with any problems that may develop before the next visit here.  I have spent a total of 30 minutes minutes of face-to-face and non-face-to-face time, preparing to see the patient,  performing a medically appropriate examination, counseling and educating the patient,  documenting clinical information in the electronic health record, and care coordination.   Dede Query PA-C Dept of Hematology and [place] at [facility] Phone: [phone]

## 2022-11-19 ENCOUNTER — Inpatient Hospital Stay (HOSPITAL_COMMUNITY): Admission: RE | Admit: 2022-11-19 | Payer: Medicare PPO | Source: Ambulatory Visit

## 2022-11-21 ENCOUNTER — Ambulatory Visit (HOSPITAL_BASED_OUTPATIENT_CLINIC_OR_DEPARTMENT_OTHER): Admission: RE | Admit: 2022-11-21 | Payer: Medicare PPO | Source: Ambulatory Visit

## 2022-11-26 NOTE — Progress Notes (Deleted)
[place]  [phone] [phone]   Name: Misty Arnold Date: 11/26/2022 MRN: [id number]  DOB: Feb 24, 1971  Patient Care Team: Nicholas Lose, MD as PCP - General (Hematology and Oncology)    INTERVAL HISTORY: Misty Arnold is a 52 y.o. female with right metastatic breast cancer s/p mastectomy, large right chest wound secondary to necrotic mass, weankess due to T9 vertebral compression s/p tumor resection and decompressive laminectomy, and hypertension. Patient did not receive treatment for some time after seeking care in [place]. Currently receiving Enhertu treatment. Palliative following for symptom management and goals of care.   SOCIAL HISTORY:     reports that she has never smoked. She has never used smokeless tobacco. She reports that she does not currently use alcohol. She reports that she does not use drugs.  ADVANCE DIRECTIVES:  Advance directives completed during recent hospitalization 08/27/2021.  Documents are on file and have been reviewed personally and [name].  CODE STATUS: Full code  PAST MEDICAL HISTORY: Past Medical History:  Diagnosis Date   Abnormal uterine bleeding    Anemia    Breast cancer ([place])    Depression    Fibroid    Hormone disorder    Hypertension    Infertility, female    ALLERGIES:  is allergic to amoxicillin, penicillins, pork-derived products, penicillin g, and tamoxifen.  MEDICATIONS:  Current Outpatient Medications  Medication Sig Dispense Refill   acetaminophen (TYLENOL) 500 MG tablet Take 2 tablets (1,000 mg total) by mouth every 6 (six) hours as needed for fever.     Ascorbic Acid (VITAMIN C PO) Take 1,000 mg by mouth daily.     atenolol (TENORMIN) 50 MG tablet Take 1 tablet (50 mg total) by mouth 2 (two) times daily. 180 tablet 3   celecoxib (CELEBREX) 200 MG capsule Take 1 capsule (200 mg total) by mouth at bedtime. 30 capsule 3   Cholecalciferol (VITAMIN D3) 125 MCG  (5000 UT) CAPS Take 1 capsule (5,000 Units total) by mouth daily. 30 capsule 3   levofloxacin (LEVAQUIN) 750 MG tablet Take 1 tablet (750 mg total) by mouth daily for 10 days. 10 tablet 0   lip balm (CARMEX) ointment Apply 1 application topically as needed for lip care (cold sores). 7 g 0   metroNIDAZOLE (METROGEL) 0.75 % gel Apply topically 2 (two) times daily. 45 g 1   Multiple Vitamins-Minerals (MULTIVITAMIN WITH MINERALS) tablet FLORADIX Supplement-Iron +minerals     Nutritional Supplements (,FEEDING SUPPLEMENT, PROSOURCE PLUS) liquid Take 30 mLs by mouth 4 (four) times daily - after meals and at bedtime.     ondansetron (ZOFRAN) 8 MG tablet Take by mouth.     polyethylene glycol (MIRALAX / GLYCOLAX) 17 g packet Take 17 g by mouth daily. 30 each 0   potassium chloride SA (KLOR-CON M) 20 MEQ tablet Take 1 tablet by mouth daily. 30 tablet 3   Silver Hydrogel GEL Apply to wound bed as directed with each dressing change. 44.4 mL 0   No current facility-administered medications for this visit.    VITAL SIGNS: There were no vitals taken for this visit. There were no vitals filed for this visit.   Estimated body mass index is 23 kg/m as calculated from the following:   Height as of 10/18/22: 5' 5"$  (1.651 m).   Weight as of 11/06/22: 138 lb 3.2 oz (62.7 kg).  PERFORMANCE STATUS (ECOG) : 0 - Asymptomatic   IMPRESSION:    PLAN: Much improved. Pain  is resolved. Occasional Tylenol use for minor aches and headaches. Appetite much improved. Weight stable.  Right breast wound healing.  I will plan to see her back in 2-3 weeks.  Patient expressed understanding and was in agreement with this plan. She also understands that She can call the clinic at any time with any questions, concerns, or complaints.     Time Total: 15 min  Visit consisted of counseling and education dealing with the complex and emotionally intense issues of symptom management and palliative care in the setting of serious  and potentially life-threatening illness.Greater than 50%  of this time was spent counseling and coordinating care related to the above assessment and plan.  [name], AGPCNP-BC  Palliative Medicine Team/[place]

## 2022-11-27 ENCOUNTER — Inpatient Hospital Stay: Payer: Medicare PPO | Admitting: Nurse Practitioner

## 2022-11-27 ENCOUNTER — Ambulatory Visit: Payer: Medicare PPO | Admitting: Hematology and Oncology

## 2022-11-27 ENCOUNTER — Inpatient Hospital Stay: Payer: Medicare PPO

## 2022-11-27 ENCOUNTER — Inpatient Hospital Stay: Payer: Medicare PPO | Admitting: Dietician

## 2022-11-27 ENCOUNTER — Other Ambulatory Visit: Payer: Self-pay

## 2022-11-27 VITALS — BP 136/96 | HR 89 | Temp 98.9°F | Resp 18 | Ht 65.0 in | Wt 141.1 lb

## 2022-11-27 DIAGNOSIS — C50919 Malignant neoplasm of unspecified site of unspecified female breast: Secondary | ICD-10-CM

## 2022-11-27 DIAGNOSIS — C7949 Secondary malignant neoplasm of other parts of nervous system: Secondary | ICD-10-CM

## 2022-11-27 DIAGNOSIS — C50411 Malignant neoplasm of upper-outer quadrant of right female breast: Secondary | ICD-10-CM

## 2022-11-27 DIAGNOSIS — Z5111 Encounter for antineoplastic chemotherapy: Secondary | ICD-10-CM | POA: Diagnosis not present

## 2022-11-27 LAB — CBC WITH DIFFERENTIAL (CANCER CENTER ONLY)
Abs Immature Granulocytes: 0.02 10*3/uL (ref 0.00–0.07)
Basophils Absolute: 0 10*3/uL (ref 0.0–0.1)
Basophils Relative: 1 %
Eosinophils Absolute: 0 10*3/uL (ref 0.0–0.5)
Eosinophils Relative: 0 %
HCT: 39.5 % (ref 36.0–46.0)
Hemoglobin: 13.4 g/dL (ref 12.0–15.0)
Immature Granulocytes: 1 %
Lymphocytes Relative: 35 %
Lymphs Abs: 1 10*3/uL (ref 0.7–4.0)
MCH: 30 pg (ref 26.0–34.0)
MCHC: 33.9 g/dL (ref 30.0–36.0)
MCV: 88.6 fL (ref 80.0–100.0)
Monocytes Absolute: 0.3 10*3/uL (ref 0.1–1.0)
Monocytes Relative: 10 %
Neutro Abs: 1.6 10*3/uL — ABNORMAL LOW (ref 1.7–7.7)
Neutrophils Relative %: 53 %
Platelet Count: 340 10*3/uL (ref 150–400)
RBC: 4.46 MIL/uL (ref 3.87–5.11)
RDW: 13.4 % (ref 11.5–15.5)
WBC Count: 2.9 10*3/uL — ABNORMAL LOW (ref 4.0–10.5)
nRBC: 0 % (ref 0.0–0.2)

## 2022-11-27 LAB — CMP (CANCER CENTER ONLY)
ALT: 12 U/L (ref 0–44)
AST: 21 U/L (ref 15–41)
Albumin: 4 g/dL (ref 3.5–5.0)
Alkaline Phosphatase: 148 U/L — ABNORMAL HIGH (ref 38–126)
Anion gap: 7 (ref 5–15)
BUN: 12 mg/dL (ref 6–20)
CO2: 29 mmol/L (ref 22–32)
Calcium: 9.9 mg/dL (ref 8.9–10.3)
Chloride: 107 mmol/L (ref 98–111)
Creatinine: 0.82 mg/dL (ref 0.44–1.00)
GFR, Estimated: >60 mL/min (ref >60)
Glucose, Bld: 95 mg/dL (ref 70–99)
Potassium: 3.5 mmol/L (ref 3.5–5.1)
Sodium: 143 mmol/L (ref 135–145)
Total Bilirubin: 0.3 mg/dL (ref 0.3–1.2)
Total Protein: 7.1 g/dL (ref 6.5–8.1)

## 2022-11-27 MED ORDER — ACETAMINOPHEN 325 MG PO TABS
650.0000 mg | ORAL_TABLET | Freq: Once | ORAL | Status: AC
  Administered 2022-11-27: 650 mg via ORAL
  Filled 2022-11-27: qty 2

## 2022-11-27 MED ORDER — PALONOSETRON HCL INJECTION 0.25 MG/5ML
0.2500 mg | Freq: Once | INTRAVENOUS | Status: AC
  Administered 2022-11-27: 0.25 mg via INTRAVENOUS
  Filled 2022-11-27: qty 5

## 2022-11-27 MED ORDER — FAM-TRASTUZUMAB DERUXTECAN-NXKI CHEMO 100 MG IV SOLR
5.4000 mg/kg | Freq: Once | INTRAVENOUS | Status: AC
  Administered 2022-11-27: 340 mg via INTRAVENOUS
  Filled 2022-11-27: qty 17

## 2022-11-27 MED ORDER — DEXTROSE 5 % IV SOLN: Freq: Once | INTRAVENOUS | Status: AC

## 2022-11-27 MED ORDER — SODIUM CHLORIDE 0.9 % IV SOLN
10.0000 mg | Freq: Once | INTRAVENOUS | Status: AC
  Administered 2022-11-27: 10 mg via INTRAVENOUS
  Filled 2022-11-27: qty 1
  Filled 2022-11-27: qty 10

## 2022-11-27 MED ORDER — DIPHENHYDRAMINE HCL 25 MG PO CAPS
50.0000 mg | ORAL_CAPSULE | Freq: Once | ORAL | Status: AC
  Administered 2022-11-27: 50 mg via ORAL
  Filled 2022-11-27: qty 2

## 2022-11-27 NOTE — Assessment & Plan Note (Deleted)
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   Treatment summary: 1. Right mastectomy 08/29/2015 at [place] (Dr.Singh): IDC with invol of skin and skin ulceration, breast capsule inv cancer, superior medial margin positive, 1/1 LN positive, grade 3, 14.3 cm, 3.5 cm, ALI, chest wall involv, ER 90-100%, PR 80-90%, HER-2 negative, Ki 67 40-50% T4CN1 (St 3B) 2. Patient started tamoxifen but developed severe headache with severe hypertension and tamoxifen was discontinued 03/17/2016.  3. 02/2017 started on Exemestane, Zoladex started 05/29/2017 4.  Hospitalization for paraplegia: T9 vertebral body compression status post decompressive laminectomy and tumor resection 01/04/2020 5.  Palliative radiation to the thoracic spine 01/28/2020 6.  Ibrance with letrozole started April 2021 discontinued 07/24/2021 7.  Enhertu started 10/17/2021 every 3 weeks  ----------------------------------------------------------------------------------------------------------------- 05/11/2021: CT angiogram: Metastatic breast cancer soft tissue breast masses greater on the right, axillary supraclavicular lymphadenopathy, multiple bone metastases    Hospitalization 08/25/2021-09/05/2021: Worsening right chest wall pain and infection (patient has appointments with palliative care as well as infectious disease)   Current treatment: Enhertu started 10/17/2021, stopped by the patient September 2023, will be resuming 11/06/2022 Toxicities: Tolerating it extremely well without any major problems.   CT CAP 10/16/2022: Markedly enlarged soft tissue nodule erupting from the posterior right eighth rib.  Interval enlargement of chest wall nodularity to the mastectomy  site on the right with new rib invasion.  Soft tissue metastases 2. to the right mastectomy site is similar in size.  Mild increase in the prevascular lymph node mediastinum thoracic spine decompression and fusion, interval enlargement of metastases in the inferior right hepatic lobe  Otitis externa: Treated with levofloxacin  Return to clinic starting January to resume Enhertu. She lives in [place] and has transportation through [employer] that brings her here.

## 2022-11-27 NOTE — Patient Instructions (Signed)
[facility]  Discharge Instructions: Thank you for choosing [facility] to provide your oncology and hematology care.   If you have a lab appointment with the Cancer Center, please go directly to the Cancer Center and check in at the registration area.   Wear comfortable clothing and clothing appropriate for easy access to any Portacath or PICC line.   We strive to give you quality time with your provider. You may need to reschedule your appointment if you arrive late (15 or more minutes).  Arriving late affects you and other patients whose appointments are after yours.  Also, if you miss three or more appointments without notifying the office, you may be dismissed from the clinic at the provider's discretion.      For prescription refill requests, have your pharmacy contact our office and allow 72 hours for refills to be completed.    Today you received the following chemotherapy and/or immunotherapy agents: Enhertu      To help prevent nausea and vomiting after your treatment, we encourage you to take your nausea medication as directed.  BELOW ARE SYMPTOMS THAT SHOULD BE REPORTED IMMEDIATELY: *FEVER GREATER THAN 100.4 F (38 C) OR HIGHER *CHILLS OR SWEATING *NAUSEA AND VOMITING THAT IS NOT CONTROLLED WITH YOUR NAUSEA MEDICATION *UNUSUAL SHORTNESS OF BREATH *UNUSUAL BRUISING OR BLEEDING *URINARY PROBLEMS (pain or burning when urinating, or frequent urination) *BOWEL PROBLEMS (unusual diarrhea, constipation, pain near the anus) TENDERNESS IN MOUTH AND THROAT WITH OR WITHOUT PRESENCE OF ULCERS (sore throat, sores in mouth, or a toothache) UNUSUAL RASH, SWELLING OR PAIN  UNUSUAL VAGINAL DISCHARGE OR ITCHING   Items with * indicate a potential emergency and should be followed up as soon as possible or go to the Emergency Department if any problems should occur.  Please show the CHEMOTHERAPY ALERT CARD or IMMUNOTHERAPY ALERT CARD at  check-in to the Emergency Department and triage nurse.  Should you have questions after your visit or need to cancel or reschedule your appointment, please contact [facility]  Dept: [phone]  and follow the prompts.  Office hours are 8:00 a.m. to 4:30 p.m. Monday - Friday. Please note that voicemails left after 4:00 p.m. may not be returned until the following business day.  We are closed weekends and major holidays. You have access to a nurse at all times for urgent questions. Please call the main number to the clinic Dept: [phone] and follow the prompts.   For any non-urgent questions, you may also contact your provider using MyChart. We now offer e-Visits for anyone 18 and older to request care online for non-urgent symptoms. For details visit [email or web address].   Also download the MyChart app! Go to the app store, search "MyChart", open the app, select [facility], and log in with your MyChart username and password.  

## 2022-11-27 NOTE — Progress Notes (Signed)
Nutrition Assessment   Reason for Assessment: +MST   ASSESSMENT: 52 year old female with metastatic breast cancer. S/p mastectomy (2012), decompressive laminectomy with tumor resection at T9 (2021) followed by palliative radiation. Patient is currently receiving Enhertu q21d.   Met with patient during infusion. She reports appetite is 100% better. Patient is eating 3 meals + snacks. Patient recalls avocado toast or eggs, [place] bacon, fruit for breakfast, snacks on nuts, yogurt, cereal, [place] sandwiches in between meals. Patient had roast beef tacos with plantains for dinner last night. Patient drinking 48 ounces of water. She is working to increase this. Patient is not currently drinking oral supplements now that appetite is good and maintaining weights. Patient denies nutrition impact symptoms.   Nutrition Focused Physical Exam: unable to complete    Medications: celebrex, levofloxacin, MVI, zofran, miralax, klor-con   Labs: reviewed    Anthropometrics:   Height: 5'5" Weight: 138 lb 3.2 oz (1/9) UBW: 143 lb 3.2 oz (07/17/22) BMI: 23   NUTRITION DIAGNOSIS: No nutrition diagnosis at this time   INTERVENTION:  Continue eating small frequent meals and snacks with adequate calories and protein Provided contact information - encouraged to call with nutrition questions/concerns   MONITORING, EVALUATION, GOAL: Patient will tolerate adequate calories and protein to promote weight maintenance    Next Visit: No follow-up scheduled - pt will contact with nutrition questions/concerns

## 2022-11-27 NOTE — Progress Notes (Deleted)
Patient Care Team: Nicholas Lose, MD as PCP - General (Hematology and Oncology)  DIAGNOSIS:  Encounter Diagnosis  Name Primary?   Primary malignant neoplasm of breast with metastasis ([place]) Yes    SUMMARY OF ONCOLOGIC HISTORY: Oncology History  Breast cancer of upper-outer quadrant of right female breast ([place])  09/21/2010 Mammogram   right breast linear and segmental pleomorphic calcifications from 4:00 to 6:00 position ultrasound revealed 1.6 cm and 1.1 cm masses   09/27/2010 Initial Biopsy   ultrasound-guided biopsy of all masses showed DCIS grade 2; patient went to cancer treatment centers of [place] for second opinion and delayed therapy   01/26/2011 Surgery   right mastectomy followed by reconstruction: 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 But she could not afford these trips.   01/08/2014 Breast MRI   right breast multiple enhancing masses within the soft tissues largest 5.6 cm in wall skin surface and the capsule of the silicone prosthesis, contiguous nodules involving in inferomedial breast and tired and subcentimeter nodules across the midline   08/29/2015 Surgery   Right mastectomy: IDC with invol of skin and skin ulceration, breast capsule inv cancer, superior medial margin positive, 1/1 LN positive, grade 3, 14.3 cm, 3.5 cm, ALI, chest wall involv, ER 90-100%, PR 80-90%, HER-2 negative, Ki 67 40-50% T4CN1 (St 3B)   03/05/2016 -  Anti-estrogen oral therapy   Tamoxifen 20 mg daily stopped due to headache and uncontrolled hypertension. Decrease to  10 mg daily 04/03/2016, stopped June 2017 and took an estrogen metabolizer over-the-counter; started Aromasin April 2018 from a [place] physician, Zoladex started on 05/2017 and switched to Letrozole in 09/2017   01/04/2020 Surgery   Patient presented to the ED on 01/04/20 for worsening lower extremity numbness and underwent a decompressive laminectomy with tumor resection at T9 that was complicated with acute blood loss anemia and leukocytosis.   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[place])  01/04/2020 Initial Diagnosis   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[place])   10/17/2021 - 06/06/2022 Chemotherapy   Patient is on Treatment Plan : BREAST METASTATIC fam-trastuzumab deruxtecan-nxki (Enhertu) q21d     10/17/2021 -  Chemotherapy   Patient is on Treatment Plan : BREAST METASTATIC Fam-Trastuzumab Deruxtecan-nxki (Enhertu) (5.4) q21d     Primary malignant neoplasm of breast with metastasis ([place])  01/08/2020 Initial Diagnosis   Metastatic breast cancer ([place])   10/17/2021 - 06/06/2022 Chemotherapy   Patient is on Treatment Plan : BREAST METASTATIC fam-trastuzumab deruxtecan-nxki (Enhertu) q21d     10/17/2021 -  Chemotherapy   Patient is on  Treatment Plan : BREAST METASTATIC Fam-Trastuzumab Deruxtecan-nxki (Enhertu) (5.4) q21d       CHIEF COMPLIANT: Follow up of metastatic breast cancer    INTERVAL HISTORY: Misty Arnold is a 52 y.o. with above-mentioned  history of metastatic breast cancer was on chemotherapy with Enhertu. She presents to the clinic for a follow-up.       ALLERGIES:  is allergic to amoxicillin, penicillins, pork-derived products, penicillin g, and tamoxifen.  MEDICATIONS:  Current Outpatient Medications  Medication Sig Dispense Refill   acetaminophen (TYLENOL) 500 MG tablet Take 2 tablets (1,000 mg total) by mouth every 6 (six) hours as needed for fever.     Ascorbic Acid (VITAMIN C PO) Take 1,000 mg by mouth daily.     atenolol (TENORMIN) 50 MG tablet Take 1 tablet (50 mg total) by mouth 2 (two) times daily. 180 tablet 3   celecoxib (CELEBREX) 200 MG capsule Take 1 capsule (200 mg total) by mouth at bedtime. 30 capsule 3   Cholecalciferol (VITAMIN D3) 125 MCG (5000 UT) CAPS Take 1 capsule (5,000 Units total) by mouth daily. 30 capsule 3   levofloxacin (LEVAQUIN) 750 MG tablet Take 1 tablet (750 mg total) by mouth daily for 10 days. 10 tablet 0   lip balm (CARMEX) ointment Apply 1 application topically as needed for lip care (cold sores). 7 g 0   metroNIDAZOLE (METROGEL) 0.75 % gel Apply topically 2 (two) times daily. 45 g 1   Multiple Vitamins-Minerals (MULTIVITAMIN WITH MINERALS) tablet FLORADIX Supplement-Iron +minerals     Nutritional Supplements (,FEEDING SUPPLEMENT, PROSOURCE PLUS) liquid Take 30 mLs by mouth 4 (four) times daily - after meals and at bedtime.     ondansetron (ZOFRAN) 8 MG tablet Take by mouth.     polyethylene glycol (MIRALAX / GLYCOLAX) 17 g packet Take 17 g by mouth daily. 30 each 0   potassium chloride SA (KLOR-CON M) 20 MEQ tablet Take 1 tablet by mouth daily. 30 tablet 3   Silver Hydrogel GEL Apply to wound bed as directed with each dressing change. 44.4 mL 0   No current facility-administered medications for this visit.    PHYSICAL EXAMINATION: ECOG PERFORMANCE STATUS: {CHL ONC ECOG PS:[id number]}  There were no vitals filed for this visit. There were no vitals filed for this  visit.  BREAST:*** No palpable masses or nodules in either right or left breasts. No palpable axillary supraclavicular or infraclavicular adenopathy no breast tenderness or nipple discharge. (exam performed in the presence of a chaperone)  LABORATORY DATA:  I have reviewed the data as listed    Latest Ref Rng & Units 11/06/2022    1:13 PM 10/15/2022    4:02 PM 07/17/2022    8:05 AM  CMP  Glucose 70 - 99 mg/dL 123  94  158   BUN 6 - 20 mg/dL 8  10  10   $ Creatinine 0.44 - 1.00 mg/dL 0.77  0.80  0.89   Sodium 135 - 145 mmol/L 140  140  139   Potassium 3.5 - 5.1 mmol/L 3.3  3.6  3.4   Chloride 98 - 111 mmol/L 103  103  107   CO2 22 - 32 mmol/L 30  29  25   $ Calcium 8.9 - 10.3 mg/dL 10.0  10.1  9.2   Total Protein 6.5 - 8.1 g/dL 7.4  7.7  6.6   Total Bilirubin 0.3 - 1.2 mg/dL 0.5  0.4  0.4   Alkaline Phos 38 - 126 U/L 118  150  130   AST 15 - 41 U/L 18  19  21   $ ALT 0 - 44 U/L 11  11  19     $ Lab Results  Component Value Date   WBC 5.1 11/06/2022   HGB 14.3 11/06/2022   HCT 42.3 11/06/2022   MCV 89.2 11/06/2022   PLT 237 11/06/2022   NEUTROABS 3.9 11/06/2022    ASSESSMENT & PLAN:  No problem-specific Assessment & Plan notes found for this encounter.    No orders of the defined types were placed in this encounter.  The patient has a good understanding of the overall plan. she agrees with it. she will call with any problems that may develop before the next visit here. Total time spent: 30 mins including face to face time and time spent for planning, charting and co-ordination of care   Suzzette Righter, [place] 11/27/22    I Gardiner Coins am acting as a Education administrator for [employer]  ***

## 2022-11-28 ENCOUNTER — Telehealth: Payer: Self-pay | Admitting: Hematology and Oncology

## 2022-11-28 NOTE — Telephone Encounter (Signed)
Per 1/23 IB, called but VM is full, mailing calendar now for appt on 2/13

## 2022-12-17 ENCOUNTER — Telehealth: Payer: Self-pay

## 2022-12-17 MED FILL — Dexamethasone Sodium Phosphate Inj 100 MG/10ML: INTRAMUSCULAR | Qty: 1 | Status: AC

## 2022-12-17 NOTE — Progress Notes (Signed)
 Patient Care Team: Gudena, Vinay, MD as PCP - General (Hematology and Oncology)  DIAGNOSIS: No diagnosis found.  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; patient went [place] [facility] [place] for second opinion and delayed therapy   01/26/2011 Surgery   right mastectomy followed by reconstruction: Invasive ductal carcinoma T1 N1 MIC M0 stage IB ER/PR positive HER-2 negative, BRCA negative, Oncotype DX low risk   05/29/2012 Procedure   right chest wall nodule excision done on 06/24/2012 showed metastatic carcinoma margins were positive, but CT scan no metastatic disease, recommended chemotherapy but patient refused also refused reexcision   04/28/2013 Treatment Plan Change   patient went to [place] for alternative treatments and took herbal medications, tonics etc. But she could not afford these trips.   01/08/2014 Breast MRI   right breast multiple enhancing masses within the soft tissues largest 5.6 cm in wall skin surface and the capsule of the silicone prosthesis, contiguous nodules involving in inferomedial breast and tired and subcentimeter nodules across the midline   08/29/2015 Surgery   Right mastectomy: IDC with invol of skin and skin ulceration, breast capsule inv cancer, superior medial margin positive, 1/1 LN positive, grade 3, 14.3 cm, 3.5 cm, ALI, chest wall involv, ER 90-100%, PR 80-90%, HER-2 negative, Ki 67 40-50% T4CN1 (St 3B)   03/05/2016 -  Anti-estrogen oral therapy   Tamoxifen 20 mg daily stopped due to headache and uncontrolled hypertension. Decrease to 10 mg daily 04/03/2016, stopped June 2017 and took an estrogen metabolizer  over-the-counter; started Aromasin April 2018 from a [place] physician, Zoladex started on 05/2017 and switched to Letrozole in 09/2017   01/04/2020 Surgery   Patient presented to the ED on 01/04/20 for worsening lower extremity numbness and underwent a decompressive laminectomy with tumor resection at T9 that was complicated with acute blood loss anemia and leukocytosis.   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)  01/04/2020 Initial Diagnosis   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)   10/17/2021 - 06/06/2022 Chemotherapy   Patient is on Treatment Plan : BREAST METASTATIC fam-trastuzumab deruxtecan-nxki (Enhertu) q21d     10/17/2021 -  Chemotherapy   Patient is on Treatment Plan : BREAST METASTATIC Fam-Trastuzumab Deruxtecan-nxki (Enhertu) (5.4) q21d     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)  01/08/2020 Initial Diagnosis   Metastatic breast cancer (H[facility])   10/17/2021 - 06/06/2022 Chemotherapy   Patient is on Treatment Plan : BREAST METASTATIC fam-trastuzumab deruxtecan-nxki (Enhertu) q21d     10/17/2021 -  Chemotherapy   Patient is on Treatment Plan : BREAST METASTATIC Fam-Trastuzumab Deruxtecan-nxki (Enhertu) (5.4) q21d         CHIEF COMPLIANT: Follow up of metastatic breast cancer   INTERVAL HISTORY: Misty Arnold is a 52 y.o. with above-mentioned history of metastatic breast cancer was on chemotherapy with Enhertu. She presents  to the clinic for a follow-up.    ALLERGIES:  is allergic to amoxicillin, penicillins, pork-derived products, penicillin g, and tamoxifen.  MEDICATIONS:  Current Outpatient Medications  Medication Sig Dispense Refill   acetaminophen (TYLENOL) 500 MG tablet Take 2 tablets (1,000 mg total) by mouth every 6 (six) hours as needed for fever.     Ascorbic Acid (VITAMIN C PO) Take 1,000 mg by mouth daily.     atenolol (TENORMIN) 50 MG tablet Take 1 tablet (50 mg total) by mouth 2 (two) times daily. 180 tablet 3   celecoxib (CELEBREX) 200 MG capsule Take 1 capsule (200 mg total) by mouth at bedtime. 30 capsule 3   Cholecalciferol (VITAMIN D3) 125 MCG (5000 UT) CAPS Take 1 capsule (5,000 Units total) by mouth daily. 30 capsule 3   levofloxacin (LEVAQUIN) 750 MG tablet Take 1 tablet (750 mg total) by mouth daily for 10 days. 10 tablet 0   lip balm (CARMEX) ointment Apply 1 application topically as needed for lip care (cold sores). 7 g 0   metroNIDAZOLE (METROGEL) 0.75 % gel Apply topically 2 (two) times daily. 45 g 1   Multiple Vitamins-Minerals (MULTIVITAMIN WITH MINERALS) tablet FLORADIX Supplement-Iron +minerals     Nutritional Supplements (,FEEDING SUPPLEMENT, PROSOURCE PLUS) liquid Take 30 mLs by mouth 4 (four) times daily - after meals and at bedtime.     ondansetron (ZOFRAN) 8 MG tablet Take by mouth.     polyethylene glycol (MIRALAX / GLYCOLAX) 17 g packet Take 17 g by mouth daily. 30 each 0   potassium chloride SA (KLOR-CON M) 20 MEQ tablet Take 1 tablet by mouth daily. 30 tablet 3   Silver Hydrogel GEL Apply to wound bed as directed with each dressing change. 44.4 mL 0   No current facility-administered medications for this visit.    PHYSICAL EXAMINATION: ECOG PERFORMANCE STATUS: {CHL ONC ECOG [phone]}  There were no vitals filed for this visit. There were no vitals filed for this visit.  BREAST:*** No palpable masses or nodules in either right or left breasts. No palpable  axillary supraclavicular or infraclavicular adenopathy no breast tenderness or nipple discharge. (exam performed in the presence of a chaperone)  LABORATORY DATA:  I have reviewed the data as listed    Latest Ref Rng & Units 11/27/2022    2:25 PM 11/06/2022    1:13 PM 10/15/2022    4:02 PM  CMP  Glucose 70 - 99 mg/dL 95  123  94   BUN 6 - 20 mg/dL '12  8  10   '$ Creatinine 0.44 - 1.00 mg/dL 0.82  0.77  0.80   Sodium 135 - 145 mmol/L 143  140  140   Potassium 3.5 - 5.1 mmol/L 3.5  3.3  3.6   Chloride 98 - 111 mmol/L 107  103  103   CO2 22 - 32 mmol/L '29  30  29   '$ Calcium 8.9 - 10.3 mg/dL 9.9  10.0  10.1   Total Protein 6.5 - 8.1 g/dL 7.1  7.4  7.7   Total Bilirubin 0.3 - 1.2 mg/dL 0.3  0.5  0.4   Alkaline Phos 38 - 126 U/L 148  118  150   AST 15 - 41 U/L '21  18  19   '$ ALT 0 - 44 U/L  $'12  11  11     'x$ Lab Results  Component Value Date   WBC 2.9 (L) 11/27/2022   HGB 13.4 11/27/2022   HCT 39.5 11/27/2022   MCV 88.6 11/27/2022   PLT 340 11/27/2022   NEUTROABS 1.6 (L) 11/27/2022    ASSESSMENT & PLAN:  No problem-specific Assessment & Plan notes found for this encounter.    No orders of the defined types were placed in this encounter.  The patient has a good understanding of the overall plan. she agrees with it. she will call with any problems that may develop before the next visit here. Total time spent: 30 mins including face to face time and time spent for planning, charting and co-ordination of care   Suzzette Righter, [place] 12/17/22    I Gardiner Coins am acting as a Education administrator for [employer]  ***

## 2022-12-17 NOTE — Telephone Encounter (Signed)
Pt called and states she received call from [facility] stating our office needed to call to confirm necessity for transportation d/t mileage. S/w [employer] and gave pt's ICD 10 codes for tx and advised necessity of appts. Pt was advised of ref # [id number] and will be picked up  tomorrow at [place]. She verbalized thanks and understanding.

## 2022-12-18 ENCOUNTER — Other Ambulatory Visit (HOSPITAL_COMMUNITY): Payer: Self-pay

## 2022-12-18 ENCOUNTER — Inpatient Hospital Stay: Payer: Medicare PPO

## 2022-12-18 ENCOUNTER — Other Ambulatory Visit: Payer: Self-pay

## 2022-12-18 ENCOUNTER — Inpatient Hospital Stay (HOSPITAL_BASED_OUTPATIENT_CLINIC_OR_DEPARTMENT_OTHER): Payer: Medicare PPO | Admitting: Nurse Practitioner

## 2022-12-18 ENCOUNTER — Inpatient Hospital Stay: Payer: Medicare PPO | Attending: Hematology and Oncology | Admitting: Hematology and Oncology

## 2022-12-18 ENCOUNTER — Encounter: Payer: Self-pay | Admitting: Nurse Practitioner

## 2022-12-18 VITALS — BP 175/101 | HR 90 | Resp 20

## 2022-12-18 VITALS — BP 149/99 | HR 88 | Temp 98.5°F | Resp 20 | Wt 137.5 lb

## 2022-12-18 DIAGNOSIS — C7949 Secondary malignant neoplasm of other parts of nervous system: Secondary | ICD-10-CM | POA: Diagnosis not present

## 2022-12-18 DIAGNOSIS — R530 Neoplastic (malignant) related fatigue: Secondary | ICD-10-CM | POA: Diagnosis not present

## 2022-12-18 DIAGNOSIS — Z170 Estrogen receptor positive status [ER+]: Secondary | ICD-10-CM

## 2022-12-18 DIAGNOSIS — Z79899 Other long term (current) drug therapy: Secondary | ICD-10-CM | POA: Diagnosis not present

## 2022-12-18 DIAGNOSIS — Z515 Encounter for palliative care: Secondary | ICD-10-CM

## 2022-12-18 DIAGNOSIS — C7951 Secondary malignant neoplasm of bone: Secondary | ICD-10-CM | POA: Insufficient documentation

## 2022-12-18 DIAGNOSIS — C50919 Malignant neoplasm of unspecified site of unspecified female breast: Secondary | ICD-10-CM

## 2022-12-18 DIAGNOSIS — C50411 Malignant neoplasm of upper-outer quadrant of right female breast: Secondary | ICD-10-CM | POA: Diagnosis present

## 2022-12-18 DIAGNOSIS — Z9011 Acquired absence of right breast and nipple: Secondary | ICD-10-CM | POA: Insufficient documentation

## 2022-12-18 DIAGNOSIS — Z5111 Encounter for antineoplastic chemotherapy: Secondary | ICD-10-CM | POA: Insufficient documentation

## 2022-12-18 LAB — CMP (CANCER CENTER ONLY)
ALT: 16 U/L (ref 0–44)
AST: 25 U/L (ref 15–41)
Albumin: 4.3 g/dL (ref 3.5–5.0)
Alkaline Phosphatase: 140 U/L — ABNORMAL HIGH (ref 38–126)
Anion gap: 7 (ref 5–15)
BUN: 15 mg/dL (ref 6–20)
CO2: 26 mmol/L (ref 22–32)
Calcium: 10.1 mg/dL (ref 8.9–10.3)
Chloride: 108 mmol/L (ref 98–111)
Creatinine: 0.88 mg/dL (ref 0.44–1.00)
GFR, Estimated: >60 mL/min (ref >60)
Glucose, Bld: 84 mg/dL (ref 70–99)
Potassium: 3.9 mmol/L (ref 3.5–5.1)
Sodium: 141 mmol/L (ref 135–145)
Total Bilirubin: 0.3 mg/dL (ref 0.3–1.2)
Total Protein: 7.4 g/dL (ref 6.5–8.1)

## 2022-12-18 LAB — CBC WITH DIFFERENTIAL (CANCER CENTER ONLY)
Abs Immature Granulocytes: 0.02 10*3/uL (ref 0.00–0.07)
Basophils Absolute: 0 10*3/uL (ref 0.0–0.1)
Basophils Relative: 1 %
Eosinophils Absolute: 0 10*3/uL (ref 0.0–0.5)
Eosinophils Relative: 1 %
HCT: 39.5 % (ref 36.0–46.0)
Hemoglobin: 13.7 g/dL (ref 12.0–15.0)
Immature Granulocytes: 1 %
Lymphocytes Relative: 20 %
Lymphs Abs: 0.8 10*3/uL (ref 0.7–4.0)
MCH: 30.7 pg (ref 26.0–34.0)
MCHC: 34.7 g/dL (ref 30.0–36.0)
MCV: 88.6 fL (ref 80.0–100.0)
Monocytes Absolute: 0.4 10*3/uL (ref 0.1–1.0)
Monocytes Relative: 9 %
Neutro Abs: 2.8 10*3/uL (ref 1.7–7.7)
Neutrophils Relative %: 68 %
Platelet Count: 279 10*3/uL (ref 150–400)
RBC: 4.46 MIL/uL (ref 3.87–5.11)
RDW: 14.8 % (ref 11.5–15.5)
WBC Count: 4 10*3/uL (ref 4.0–10.5)
nRBC: 0 % (ref 0.0–0.2)

## 2022-12-18 MED ORDER — "AQUACEL AG ADVANTAGE 4""X5"" EX PADS"
1.0000 | MEDICATED_PAD | Freq: Two times a day (BID) | CUTANEOUS | 3 refills | Status: DC
  Filled 2022-12-18: qty 60, fill #0

## 2022-12-18 MED ORDER — DEXTROSE 5 % IV SOLN: Freq: Once | INTRAVENOUS | Status: AC

## 2022-12-18 MED ORDER — SODIUM CHLORIDE 0.9 % IV SOLN
10.0000 mg | Freq: Once | INTRAVENOUS | Status: AC
  Administered 2022-12-18: 10 mg via INTRAVENOUS
  Filled 2022-12-18: qty 10

## 2022-12-18 MED ORDER — PALONOSETRON HCL INJECTION 0.25 MG/5ML
0.2500 mg | Freq: Once | INTRAVENOUS | Status: AC
  Administered 2022-12-18: 0.25 mg via INTRAVENOUS
  Filled 2022-12-18: qty 5

## 2022-12-18 MED ORDER — FAM-TRASTUZUMAB DERUXTECAN-NXKI CHEMO 100 MG IV SOLR
5.4000 mg/kg | Freq: Once | INTRAVENOUS | Status: AC
  Administered 2022-12-18: 340 mg via INTRAVENOUS
  Filled 2022-12-18: qty 17

## 2022-12-18 MED ORDER — DIPHENHYDRAMINE HCL 25 MG PO CAPS
50.0000 mg | ORAL_CAPSULE | Freq: Once | ORAL | Status: AC
  Administered 2022-12-18: 50 mg via ORAL
  Filled 2022-12-18: qty 2

## 2022-12-18 MED ORDER — ACETAMINOPHEN 325 MG PO TABS
650.0000 mg | ORAL_TABLET | Freq: Once | ORAL | Status: AC
  Administered 2022-12-18: 650 mg via ORAL
  Filled 2022-12-18: qty 2

## 2022-12-18 NOTE — Patient Instructions (Signed)
[facility]  Discharge Instructions: Thank you for choosing [facility] to provide your oncology and hematology care.   If you have a lab appointment with the Cancer Center, please go directly to the Cancer Center and check in at the registration area.   Wear comfortable clothing and clothing appropriate for easy access to any Portacath or PICC line.   We strive to give you quality time with your provider. You may need to reschedule your appointment if you arrive late (15 or more minutes).  Arriving late affects you and other patients whose appointments are after yours.  Also, if you miss three or more appointments without notifying the office, you may be dismissed from the clinic at the provider's discretion.      For prescription refill requests, have your pharmacy contact our office and allow 72 hours for refills to be completed.    Today you received the following chemotherapy and/or immunotherapy agents: Enhertu      To help prevent nausea and vomiting after your treatment, we encourage you to take your nausea medication as directed.  BELOW ARE SYMPTOMS THAT SHOULD BE REPORTED IMMEDIATELY: *FEVER GREATER THAN 100.4 F (38 C) OR HIGHER *CHILLS OR SWEATING *NAUSEA AND VOMITING THAT IS NOT CONTROLLED WITH YOUR NAUSEA MEDICATION *UNUSUAL SHORTNESS OF BREATH *UNUSUAL BRUISING OR BLEEDING *URINARY PROBLEMS (pain or burning when urinating, or frequent urination) *BOWEL PROBLEMS (unusual diarrhea, constipation, pain near the anus) TENDERNESS IN MOUTH AND THROAT WITH OR WITHOUT PRESENCE OF ULCERS (sore throat, sores in mouth, or a toothache) UNUSUAL RASH, SWELLING OR PAIN  UNUSUAL VAGINAL DISCHARGE OR ITCHING   Items with * indicate a potential emergency and should be followed up as soon as possible or go to the Emergency Department if any problems should occur.  Please show the CHEMOTHERAPY ALERT CARD or IMMUNOTHERAPY ALERT CARD at  check-in to the Emergency Department and triage nurse.  Should you have questions after your visit or need to cancel or reschedule your appointment, please contact [facility]  Dept: [phone]  and follow the prompts.  Office hours are 8:00 a.m. to 4:30 p.m. Monday - Friday. Please note that voicemails left after 4:00 p.m. may not be returned until the following business day.  We are closed weekends and major holidays. You have access to a nurse at all times for urgent questions. Please call the main number to the clinic Dept: [phone] and follow the prompts.   For any non-urgent questions, you may also contact your provider using MyChart. We now offer e-Visits for anyone 18 and older to request care online for non-urgent symptoms. For details visit [email or web address].   Also download the MyChart app! Go to the app store, search "MyChart", open the app, select [facility], and log in with your MyChart username and password.  

## 2022-12-18 NOTE — Progress Notes (Signed)
[place]  [phone] [phone]   Name: Misty Arnold Date: 12/18/2022 MRN: [id number]  DOB: 1971-10-01  Patient Care Team: Nicholas Lose, MD as PCP - General (Hematology and Oncology)    INTERVAL HISTORY: Misty Arnold is a 52 y.o. female with right metastatic breast cancer s/p mastectomy, large right chest wound secondary to necrotic mass, weankess due to T9 vertebral compression s/p tumor resection and decompressive laminectomy, and hypertension. Patient did not receive treatment for some time after seeking care in [place]. Currently receiving Enhertu treatment. Palliative following for symptom management and goals of care.   SOCIAL HISTORY:     reports that she has never smoked. She has never used smokeless tobacco. She reports that she does not currently use alcohol. She reports that she does not use drugs.  ADVANCE DIRECTIVES:  Advance directives completed during recent hospitalization 08/27/2021.  Documents are on file and have been reviewed personally and [name].  CODE STATUS: Full code  PAST MEDICAL HISTORY: Past Medical History:  Diagnosis Date   Abnormal uterine bleeding    Anemia    Breast cancer ([place])    Depression    Fibroid    Hormone disorder    Hypertension    Infertility, female    ALLERGIES:  is allergic to amoxicillin, penicillins, pork-derived products, penicillin g, and tamoxifen.  MEDICATIONS:  Current Outpatient Medications  Medication Sig Dispense Refill   acetaminophen (TYLENOL) 500 MG tablet Take 2 tablets (1,000 mg total) by mouth every 6 (six) hours as needed for fever.     Ascorbic Acid (VITAMIN C PO) Take 1,000 mg by mouth daily.     atenolol (TENORMIN) 50 MG tablet Take 1 tablet (50 mg total) by mouth 2 (two) times daily. 180 tablet 3   celecoxib (CELEBREX) 200 MG capsule Take 1 capsule (200 mg total) by mouth at bedtime. 30 capsule 3   Cholecalciferol (VITAMIN D3) 125 MCG  (5000 UT) CAPS Take 1 capsule (5,000 Units total) by mouth daily. 30 capsule 3   levofloxacin (LEVAQUIN) 750 MG tablet Take 1 tablet (750 mg total) by mouth daily for 10 days. 10 tablet 0   lip balm (CARMEX) ointment Apply 1 application topically as needed for lip care (cold sores). 7 g 0   metroNIDAZOLE (METROGEL) 0.75 % gel Apply topically 2 (two) times daily. 45 g 1   Multiple Vitamins-Minerals (MULTIVITAMIN WITH MINERALS) tablet FLORADIX Supplement-Iron +minerals     Nutritional Supplements (,FEEDING SUPPLEMENT, PROSOURCE PLUS) liquid Take 30 mLs by mouth 4 (four) times daily - after meals and at bedtime.     ondansetron (ZOFRAN) 8 MG tablet Take by mouth.     polyethylene glycol (MIRALAX / GLYCOLAX) 17 g packet Take 17 g by mouth daily. 30 each 0   potassium chloride SA (KLOR-CON M) 20 MEQ tablet Take 1 tablet by mouth daily. 30 tablet 3   Silver Hydrogel GEL Apply to wound bed as directed with each dressing change. 44.4 mL 0   No current facility-administered medications for this visit.    VITAL SIGNS: There were no vitals taken for this visit. There were no vitals filed for this visit.   Estimated body mass index is 23.48 kg/m as calculated from the following:   Height as of 11/27/22: 5' 5"$  (1.651 m).   Weight as of 11/27/22: 141 lb 1.5 oz (64 kg).  PERFORMANCE STATUS (ECOG) : 0 - Asymptomatic  Assessment NAD AAO x3 RRR  IMPRESSION:  I  saw [name] during her infusion. Continues do well.  No acute distress noted.  Still living with her brother.  Is trying to remain active and involved with her family.  Patient denies nausea, vomiting, constipation, or diarrhea.  Appetite continues to be good.  No longer drinking daily Ensure supplements.  Weight is maintaining.  Does express some drainage at a small opening on the right side of her healing wound.  She is taking care of this daily and covering with a dressing.  Aquacel and ABD pads preferably.  She denies any significant  pain.  Occasional discomfort which is controlled with Tylenol or ibuprofen.  No symptom management needs at this time.  Patient knows to contact our office as needed.   PLAN: Much improved. Pain is resolved. Occasional Tylenol use for minor aches and headaches. Appetite much improved. Weight stable.  Right breast wound healing.  I will plan to see her back in 6-8 weeks.  Patient expressed understanding and was in agreement with this plan. She also understands that She can call the clinic at any time with any questions, concerns, or complaints.   Time Total: 20 min   Visit consisted of counseling and education dealing with the complex and emotionally intense issues of symptom management and palliative care in the setting of serious and potentially life-threatening illness.Greater than 50%  of this time was spent counseling and coordinating care related to the above assessment and plan.  Misty Arnold, AGPCNP-BC  Palliative Medicine Team/[facility]

## 2022-12-18 NOTE — Assessment & Plan Note (Addendum)
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   Treatment summary: 1. Right mastectomy 08/29/2015 at [place] (Dr.Singh): IDC with invol of skin and skin ulceration, breast capsule inv cancer, superior medial margin positive, 1/1 LN positive, grade 3, 14.3 cm, 3.5 cm, ALI, chest wall involv, ER 90-100%, PR 80-90%, HER-2 negative, Ki 67 40-50% T4CN1 (St 3B) 2. Patient started tamoxifen but developed severe headache with severe hypertension and tamoxifen was discontinued 03/17/2016.  3. 02/2017 started on Exemestane, Zoladex started 05/29/2017 4.  Hospitalization for paraplegia: T9 vertebral body compression status post decompressive laminectomy and tumor resection 01/04/2020 5.  Palliative radiation to the thoracic spine 01/28/2020 6.  Ibrance with letrozole started April 2021 discontinued 07/24/2021 7.  Enhertu started 10/17/2021 every 3 weeks  ----------------------------------------------------------------------------------------------------------------- 05/11/2021: CT angiogram: Metastatic breast cancer soft tissue breast masses greater on the right, axillary supraclavicular lymphadenopathy, multiple bone metastases    Hospitalization 08/25/2021-09/05/2021: Worsening right chest wall pain and infection (patient has appointments with palliative care as well as infectious disease)   Current treatment: Enhertu started 10/17/2021, stopped by the patient September 2023, resumed 11/06/2022, today cycle 17 Toxicities: Tolerating it extremely well without any major problems.   CT CAP 10/16/2022: Markedly enlarged soft tissue nodule erupting from the posterior right eighth rib.  Interval enlargement of chest wall nodularity to the  mastectomy site on the right with new rib invasion.  Soft tissue metastases 2. to the right mastectomy site is similar in size.  Mild increase in the prevascular lymph node mediastinum thoracic spine decompression and fusion, interval enlargement of metastases in the inferior right hepatic lobe   Next scan will be done in April. Return to clinic with each chemotherapy

## 2022-12-19 ENCOUNTER — Telehealth: Payer: Self-pay | Admitting: Hematology and Oncology

## 2022-12-19 NOTE — Telephone Encounter (Signed)
Scheduled appointments per WQ. Patient is aware of the made appointments.

## 2022-12-20 ENCOUNTER — Other Ambulatory Visit (HOSPITAL_COMMUNITY): Payer: Self-pay

## 2022-12-24 ENCOUNTER — Ambulatory Visit (HOSPITAL_COMMUNITY)
Admission: RE | Admit: 2022-12-24 | Discharge: 2022-12-24 | Disposition: A | Discharge status: Home / Self Care | Payer: Medicare PPO | Source: Ambulatory Visit | Attending: Physician Assistant | Admitting: Physician Assistant

## 2022-12-24 ENCOUNTER — Other Ambulatory Visit (HOSPITAL_COMMUNITY): Payer: Medicare PPO

## 2022-12-24 ENCOUNTER — Other Ambulatory Visit (HOSPITAL_COMMUNITY): Payer: Self-pay

## 2022-12-24 DIAGNOSIS — Z170 Estrogen receptor positive status [ER+]: Secondary | ICD-10-CM | POA: Insufficient documentation

## 2022-12-24 DIAGNOSIS — I10 Essential (primary) hypertension: Secondary | ICD-10-CM | POA: Diagnosis not present

## 2022-12-24 DIAGNOSIS — Z0189 Encounter for other specified special examinations: Secondary | ICD-10-CM

## 2022-12-24 DIAGNOSIS — C50411 Malignant neoplasm of upper-outer quadrant of right female breast: Secondary | ICD-10-CM | POA: Diagnosis not present

## 2022-12-24 LAB — ECHOCARDIOGRAM COMPLETE
Area-P 1/2: 4.21 cm2
Calc EF: 56.6 %
S' Lateral: 2.3 cm
Single Plane A2C EF: 55 %
Single Plane A4C EF: 55 %

## 2022-12-24 NOTE — Progress Notes (Signed)
Echocardiogram 2D Echocardiogram has been performed.  Misty Arnold 12/24/2022, 10:56 AM

## 2022-12-25 ENCOUNTER — Other Ambulatory Visit: Payer: Self-pay | Admitting: *Deleted

## 2022-12-25 DIAGNOSIS — Z170 Estrogen receptor positive status [ER+]: Secondary | ICD-10-CM

## 2022-12-25 NOTE — Progress Notes (Signed)
Received call from pt with complaint of new cough that is worse at night.  Pt denies fever or recent sick contacts.  Per MD with pt being on Enhertu and CT chest would need to be obtained to r/u interstitial lung disease.  Verbal orders placed, pt notified and verbalized understanding.

## 2023-01-03 ENCOUNTER — Ambulatory Visit (HOSPITAL_COMMUNITY)
Admission: RE | Admit: 2023-01-03 | Discharge: 2023-01-03 | Disposition: A | Discharge status: Home / Self Care | Payer: Medicare PPO | Source: Ambulatory Visit | Attending: Hematology and Oncology | Admitting: Hematology and Oncology

## 2023-01-03 ENCOUNTER — Encounter (HOSPITAL_COMMUNITY): Payer: Self-pay

## 2023-01-03 DIAGNOSIS — C50411 Malignant neoplasm of upper-outer quadrant of right female breast: Secondary | ICD-10-CM | POA: Insufficient documentation

## 2023-01-03 DIAGNOSIS — Z170 Estrogen receptor positive status [ER+]: Secondary | ICD-10-CM | POA: Diagnosis present

## 2023-01-03 MED ORDER — SODIUM CHLORIDE (PF) 0.9 % IJ SOLN
INTRAMUSCULAR | Status: AC
  Filled 2023-01-03: qty 50

## 2023-01-03 MED ORDER — IOHEXOL 300 MG/ML  SOLN
75.0000 mL | Freq: Once | INTRAMUSCULAR | Status: AC | PRN
  Administered 2023-01-03: 75 mL via INTRAVENOUS

## 2023-01-03 NOTE — Progress Notes (Signed)
Patient Care Team: Misty Lose, MD as PCP - General (Hematology and Oncology)  DIAGNOSIS: No diagnosis found.  SUMMARY OF ONCOLOGIC HISTORY: Oncology History  Breast cancer of upper-outer quadrant of right female breast ([place])  09/21/2010 Mammogram   right breast linear and segmental pleomorphic calcifications from 4:00 to 6:00 position ultrasound revealed 1.6 cm and 1.1 cm masses   09/27/2010 Initial Biopsy   ultrasound-guided biopsy of all masses showed DCIS grade 2; patient went to cancer treatment centers of [place] for second opinion and delayed therapy   01/26/2011 Surgery   right mastectomy followed by reconstruction: 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 But she could not afford these trips.   01/08/2014 Breast MRI   right breast multiple enhancing masses within the soft tissues largest 5.6 cm in wall skin surface and the capsule of the silicone prosthesis, contiguous nodules involving in inferomedial breast and tired and subcentimeter nodules across the midline   11/02/2014 Cancer Staging   Staging form: Breast, AJCC 7th Edition - Clinical: Stage IV (T4b, N0, M1) - Signed by Misty Lose, MD on 12/18/2022   08/29/2015 Surgery   Right mastectomy: IDC with invol of skin and skin ulceration, breast capsule inv cancer, superior medial margin positive, 1/1 LN positive, grade 3, 14.3 cm, 3.5 cm, ALI, chest wall involv, ER 90-100%, PR 80-90%, HER-2 negative, Ki 67 40-50% T4CN1 (St 3B)   03/05/2016 -  Anti-estrogen oral therapy   Tamoxifen 20 mg daily  stopped due to headache and uncontrolled hypertension. Decrease to 10 mg daily 04/03/2016, stopped June 2017 and took an estrogen metabolizer over-the-counter; started Aromasin April 2018 from a [place] physician, Zoladex started on 05/2017 and switched to Letrozole in 09/2017   01/04/2020 Surgery   Patient presented to the ED on 01/04/20 for worsening lower extremity numbness and underwent a decompressive laminectomy with tumor resection at T9 that was complicated with acute blood loss anemia and leukocytosis.   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[place])  01/04/2020 Initial Diagnosis   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[place])   10/17/2021 - 06/06/2022 Chemotherapy   Patient is on Treatment Plan : BREAST METASTATIC fam-trastuzumab deruxtecan-nxki (Enhertu) q21d     10/17/2021 -  Chemotherapy   Patient is on Treatment Plan : BREAST METASTATIC Fam-Trastuzumab Deruxtecan-nxki (Enhertu) (5.4) q21d     Primary malignant neoplasm of breast with metastasis ([place])  01/08/2020 Initial Diagnosis   Metastatic breast cancer ([place])   10/17/2021 - 06/06/2022 Chemotherapy   Patient is on Treatment Plan : BREAST METASTATIC fam-trastuzumab deruxtecan-nxki (Enhertu) q21d  10/17/2021 -  Chemotherapy   Patient is on Treatment Plan : BREAST METASTATIC Fam-Trastuzumab Deruxtecan-nxki (Enhertu) (5.4) q21d       CHIEF COMPLIANT: Follow up of metastatic breast cancer     INTERVAL HISTORY: Misty Arnold is a  52 y.o. with above-mentioned history of metastatic breast cancer was on chemotherapy with Enhertu. She presents to the clinic for a follow-up.    ALLERGIES:  is allergic to amoxicillin, penicillins, pork-derived products, penicillin g, and tamoxifen.  MEDICATIONS:  Current Outpatient Medications  Medication Sig Dispense Refill   Ascorbic Acid (VITAMIN C PO) Take 1,000 mg by mouth daily.     atenolol (TENORMIN) 50 MG tablet Take 1 tablet (50 mg total) by mouth 2 (two) times daily. 180 tablet 3   celecoxib (CELEBREX) 200 MG capsule Take 1 capsule (200 mg total) by mouth at bedtime. 30 capsule 3   Cholecalciferol (VITAMIN D3) 125 MCG (5000 UT) CAPS Take 1 capsule (5,000 Units total) by mouth daily. 30 capsule 3   lip balm (CARMEX) ointment Apply 1 application topically as needed for lip care (cold sores). 7 g 0   metroNIDAZOLE (METROGEL) 0.75 % gel Apply topically 2 (two) times daily. 45 g 1   Multiple Vitamins-Minerals (MULTIVITAMIN WITH MINERALS) tablet FLORADIX Supplement-Iron +minerals     Nutritional Supplements (,FEEDING SUPPLEMENT, PROSOURCE PLUS) liquid Take 30 mLs by mouth 4 (four) times daily - after meals and at bedtime.     ondansetron (ZOFRAN) 8 MG tablet Take by mouth.     polyethylene glycol (MIRALAX / GLYCOLAX) 17 g packet Take 17 g by mouth daily. 30 each 0   potassium chloride SA (KLOR-CON M) 20 MEQ tablet Take 1 tablet by mouth daily. 30 tablet 3   Silver-Carboxymethylcellulose (AQUACEL AG ADVANTAGE) 4"X5" PADS Apply 1 Pad topically in the morning and at bedtime. 60 each 3   No current facility-administered medications for this visit.   Facility-Administered Medications Ordered in Other Visits  Medication Dose Route Frequency Provider Last Rate Last Admin   sodium chloride (PF) 0.9 % injection             PHYSICAL EXAMINATION: ECOG PERFORMANCE STATUS: {CHL ONC ECOG [phone]}  There were no vitals filed for this  visit. There were no vitals filed for this visit.  BREAST:*** No palpable masses or nodules in either right or left breasts. No palpable axillary supraclavicular or infraclavicular adenopathy no breast tenderness or nipple discharge. (exam performed in the presence of a chaperone)  LABORATORY DATA:  I have reviewed the data as listed    Latest Ref Rng & Units 12/18/2022   10:19 AM 11/27/2022    2:25 PM 11/06/2022    1:13 PM  CMP  Glucose 70 - 99 mg/dL 84  95  123   BUN 6 - 20 mg/dL '15  12  8   '$ Creatinine 0.44 - 1.00 mg/dL 0.88  0.82  0.77   Sodium 135 - 145 mmol/L 141  143  140   Potassium 3.5 - 5.1 mmol/L 3.9  3.5  3.3   Chloride 98 - 111 mmol/L 108  107  103   CO2 22 - 32 mmol/L '26  29  30   '$ Calcium 8.9 - 10.3 mg/dL 10.1  9.9  10.0   Total Protein 6.5 - 8.1 g/dL 7.4  7.1  7.4   Total Bilirubin 0.3 - 1.2 mg/dL 0.3  0.3  0.5   Alkaline Phos 38 - 126 U/L 140  148  118  AST 15 - 41 U/L '25  21  18   '$ ALT 0 - 44 U/L '16  12  11     '$ Lab Results  Component Value Date   WBC 4.0 12/18/2022   HGB 13.7 12/18/2022   HCT 39.5 12/18/2022   MCV 88.6 12/18/2022   PLT 279 12/18/2022   NEUTROABS 2.8 12/18/2022    ASSESSMENT & PLAN:  No problem-specific Assessment & Plan notes found for this encounter.    No orders of the defined types were placed in this encounter.  The patient has a good understanding of the overall plan. she agrees with it. she will call with any problems that may develop before the next visit here. Total time spent: 30 mins including face to face time and time spent for planning, charting and co-ordination of care   Suzzette Righter, [place] 01/03/23    I Gardiner Coins am acting as a Education administrator for [employer]  ***

## 2023-01-04 ENCOUNTER — Telehealth: Payer: Self-pay | Admitting: Licensed Clinical Social Worker

## 2023-01-04 NOTE — Telephone Encounter (Signed)
Received message that pt has treatment on Tuesday and had questions about lodging options. She has used [facility] program in the past.  Attempted to call pt back, No answer, no voicemail available.   Aston Lieske E Verlee Pope, LCSW

## 2023-01-07 MED FILL — Dexamethasone Sodium Phosphate Inj 100 MG/10ML: INTRAMUSCULAR | Qty: 1 | Status: AC

## 2023-01-08 ENCOUNTER — Telehealth: Payer: Self-pay

## 2023-01-08 ENCOUNTER — Other Ambulatory Visit: Payer: Self-pay

## 2023-01-08 ENCOUNTER — Inpatient Hospital Stay: Payer: Medicare PPO

## 2023-01-08 ENCOUNTER — Inpatient Hospital Stay: Payer: Medicare PPO | Attending: Hematology and Oncology

## 2023-01-08 ENCOUNTER — Inpatient Hospital Stay (HOSPITAL_BASED_OUTPATIENT_CLINIC_OR_DEPARTMENT_OTHER): Payer: Medicare PPO | Admitting: Hematology and Oncology

## 2023-01-08 VITALS — BP 134/92 | HR 90 | Temp 98.7°F

## 2023-01-08 VITALS — BP 134/84 | HR 99 | Temp 98.3°F | Resp 18 | Ht 65.0 in | Wt 133.0 lb

## 2023-01-08 DIAGNOSIS — C50919 Malignant neoplasm of unspecified site of unspecified female breast: Secondary | ICD-10-CM

## 2023-01-08 DIAGNOSIS — Z170 Estrogen receptor positive status [ER+]: Secondary | ICD-10-CM

## 2023-01-08 DIAGNOSIS — Z79899 Other long term (current) drug therapy: Secondary | ICD-10-CM | POA: Insufficient documentation

## 2023-01-08 DIAGNOSIS — C7949 Secondary malignant neoplasm of other parts of nervous system: Secondary | ICD-10-CM

## 2023-01-08 DIAGNOSIS — Z5111 Encounter for antineoplastic chemotherapy: Secondary | ICD-10-CM | POA: Insufficient documentation

## 2023-01-08 DIAGNOSIS — C7951 Secondary malignant neoplasm of bone: Secondary | ICD-10-CM | POA: Diagnosis not present

## 2023-01-08 DIAGNOSIS — C50411 Malignant neoplasm of upper-outer quadrant of right female breast: Secondary | ICD-10-CM | POA: Insufficient documentation

## 2023-01-08 DIAGNOSIS — Z9011 Acquired absence of right breast and nipple: Secondary | ICD-10-CM | POA: Diagnosis not present

## 2023-01-08 LAB — CMP (CANCER CENTER ONLY)
ALT: 14 U/L (ref 0–44)
AST: 21 U/L (ref 15–41)
Albumin: 4.1 g/dL (ref 3.5–5.0)
Alkaline Phosphatase: 125 U/L (ref 38–126)
Anion gap: 8 (ref 5–15)
BUN: 14 mg/dL (ref 6–20)
CO2: 30 mmol/L (ref 22–32)
Calcium: 9.5 mg/dL (ref 8.9–10.3)
Chloride: 105 mmol/L (ref 98–111)
Creatinine: 1.01 mg/dL — ABNORMAL HIGH (ref 0.44–1.00)
GFR, Estimated: >60 mL/min (ref >60)
Glucose, Bld: 117 mg/dL — ABNORMAL HIGH (ref 70–99)
Potassium: 3.3 mmol/L — ABNORMAL LOW (ref 3.5–5.1)
Sodium: 143 mmol/L (ref 135–145)
Total Bilirubin: 0.7 mg/dL (ref 0.3–1.2)
Total Protein: 7.4 g/dL (ref 6.5–8.1)

## 2023-01-08 LAB — CBC WITH DIFFERENTIAL (CANCER CENTER ONLY)
Abs Immature Granulocytes: 0.03 10*3/uL (ref 0.00–0.07)
Basophils Absolute: 0 10*3/uL (ref 0.0–0.1)
Basophils Relative: 1 %
Eosinophils Absolute: 0 10*3/uL (ref 0.0–0.5)
Eosinophils Relative: 0 %
HCT: 37.5 % (ref 36.0–46.0)
Hemoglobin: 13 g/dL (ref 12.0–15.0)
Immature Granulocytes: 1 %
Lymphocytes Relative: 15 %
Lymphs Abs: 0.9 10*3/uL (ref 0.7–4.0)
MCH: 30.6 pg (ref 26.0–34.0)
MCHC: 34.7 g/dL (ref 30.0–36.0)
MCV: 88.2 fL (ref 80.0–100.0)
Monocytes Absolute: 0.8 10*3/uL (ref 0.1–1.0)
Monocytes Relative: 13 %
Neutro Abs: 4.5 10*3/uL (ref 1.7–7.7)
Neutrophils Relative %: 70 %
Platelet Count: 311 10*3/uL (ref 150–400)
RBC: 4.25 MIL/uL (ref 3.87–5.11)
RDW: 14.6 % (ref 11.5–15.5)
WBC Count: 6.3 10*3/uL (ref 4.0–10.5)
nRBC: 0 % (ref 0.0–0.2)

## 2023-01-08 MED ORDER — SODIUM CHLORIDE 0.9 % IV SOLN
10.0000 mg | Freq: Once | INTRAVENOUS | Status: AC
  Administered 2023-01-08: 10 mg via INTRAVENOUS
  Filled 2023-01-08: qty 10

## 2023-01-08 MED ORDER — DEXTROSE 5 % IV SOLN: Freq: Once | INTRAVENOUS | Status: AC

## 2023-01-08 MED ORDER — FAM-TRASTUZUMAB DERUXTECAN-NXKI CHEMO 100 MG IV SOLR
5.4000 mg/kg | Freq: Once | INTRAVENOUS | Status: AC
  Administered 2023-01-08: 340 mg via INTRAVENOUS
  Filled 2023-01-08: qty 17

## 2023-01-08 MED ORDER — PALONOSETRON HCL INJECTION 0.25 MG/5ML
0.2500 mg | Freq: Once | INTRAVENOUS | Status: AC
  Administered 2023-01-08: 0.25 mg via INTRAVENOUS
  Filled 2023-01-08: qty 5

## 2023-01-08 MED ORDER — DIPHENHYDRAMINE HCL 25 MG PO CAPS
50.0000 mg | ORAL_CAPSULE | Freq: Once | ORAL | Status: AC
  Administered 2023-01-08: 50 mg via ORAL
  Filled 2023-01-08: qty 2

## 2023-01-08 MED ORDER — ACETAMINOPHEN 325 MG PO TABS
650.0000 mg | ORAL_TABLET | Freq: Once | ORAL | Status: AC
  Administered 2023-01-08: 650 mg via ORAL
  Filled 2023-01-08: qty 2

## 2023-01-08 NOTE — Progress Notes (Signed)
Made pt aware of elevated diastolic & to check & report to PCP if still hight.  She expressed understanding.

## 2023-01-08 NOTE — Telephone Encounter (Signed)
Pt called and LVM asking if we can draw estrogen labs when she comes in. Per MD, extra clot specimen added to labs and LVM for pt advising her to further discuss this with MD.

## 2023-01-08 NOTE — Assessment & Plan Note (Addendum)
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   Treatment summary: 1. Right mastectomy 08/29/2015 at [place] (Dr.Singh): IDC with invol of skin and skin ulceration, breast capsule inv cancer, superior medial margin positive, 1/1 LN positive, grade 3, 14.3 cm, 3.5 cm, ALI, chest wall involv, ER 90-100%, PR 80-90%, HER-2 negative, Ki 67 40-50% T4CN1 (St 3B) 2. Patient started tamoxifen but developed severe headache with severe hypertension and tamoxifen was discontinued 03/17/2016.  3. 02/2017 started on Exemestane, Zoladex started 05/29/2017 4.  Hospitalization for paraplegia: T9 vertebral body compression status post decompressive laminectomy and tumor resection 01/04/2020 5.  Palliative radiation to the thoracic spine 01/28/2020 6.  Ibrance with letrozole started April 2021 discontinued 07/24/2021 7.  Enhertu started 10/17/2021 every 3 weeks  ----------------------------------------------------------------------------------------------------------------- 05/11/2021: CT angiogram: Metastatic breast cancer soft tissue breast masses greater on the right, axillary supraclavicular lymphadenopathy, multiple bone metastases    Hospitalization 08/25/2021-09/05/2021: Worsening right chest wall pain and infection (patient has appointments with palliative care as well as infectious disease)   Current treatment: Enhertu started 10/17/2021, stopped by the patient September 2023, resumed 11/06/2022, today cycle 17 Toxicities: Tolerating it extremely well without any major problems.   CT CAP 10/16/2022: Markedly enlarged soft tissue nodule erupting from the posterior right eighth rib.  Interval enlargement of chest wall nodularity to the  mastectomy site on the right with new rib invasion.  Soft tissue metastases 2. to the right mastectomy site is similar in size.  Mild increase in the prevascular lymph node mediastinum thoracic spine decompression and fusion, interval enlargement of metastases in the inferior right hepatic lobe CT chest 12/30/2022: Overall progression of disease increasing soft tissue components along with several lytic bone lesions, increase in size of liver metastases and 2 mediastinal lymph nodes slightly smaller   Discussion: I discussed switching treatment to [place] but patient wants to continue with the Enhertu and she plans to do an intensive detox regimen which she believes will be able to make a difference.  Our plan is to do 3 more rounds of Enhertu and then perform a CT chest.

## 2023-01-14 ENCOUNTER — Telehealth: Payer: Self-pay | Admitting: *Deleted

## 2023-01-14 NOTE — Telephone Encounter (Signed)
Received call from pt requesting to add Misty Arnold to her HIPAA designated party release form.  RN educated pt that we can add [name] to contact list but pt would need to fill out updated HIPAA form during next visit.  Pt verbalized understanding.

## 2023-01-15 ENCOUNTER — Telehealth: Payer: Self-pay | Admitting: *Deleted

## 2023-01-15 NOTE — Telephone Encounter (Signed)
Received call from pt stating she would like to allow the release of her medical information to Valda Lamb, Higher education careers adviser with [employer].  Pt states she will complete HIPAA documentation during her next visit to have on file. Pt states she does not have a direct number for [place] at this time.

## 2023-01-16 ENCOUNTER — Telehealth: Payer: Self-pay | Admitting: *Deleted

## 2023-01-16 NOTE — Telephone Encounter (Signed)
Received VM from Valda Lamb [phone]) with [employer] requesting diagnosis information on pt.  Pt gave verbal consent over the phone yesterday 01/15/23 as documented to provided needed information to [place].  RN attempt x1 to return call.  No answer, LVM for [name] to return call to the office.

## 2023-01-24 ENCOUNTER — Telehealth: Payer: Self-pay | Admitting: *Deleted

## 2023-01-24 NOTE — Telephone Encounter (Signed)
Received after hours message from pt.  RN attempt x1 to return call.  No answer, LVM for pt to return call to the office.

## 2023-01-25 NOTE — Progress Notes (Unsigned)
[name]  Telephone:([phone]) [phone] [phone]   Name: Misty Arnold Date: 01/25/2023 MRN: [id number]  DOB: Oct 03, 1971  Patient Care Team: Nicholas Lose, MD as PCP - General (Hematology and Oncology)    INTERVAL HISTORY: Misty Arnold is a 53 y.o. female with right metastatic breast cancer s/p mastectomy, large right chest wound secondary to necrotic mass, weankess due to T9 vertebral compression s/p tumor resection and decompressive laminectomy, and hypertension. Patient did not receive treatment for some time after seeking care in [place]. Currently receiving Enhertu treatment. Palliative following for symptom management and goals of care.   SOCIAL HISTORY:     reports that she has never smoked. She has never used smokeless tobacco. She reports that she does not currently use alcohol. She reports that she does not use drugs.  ADVANCE DIRECTIVES:  Advance directives completed during recent hospitalization 08/27/2021.  Documents are on file and have been reviewed personally and [name].  CODE STATUS: Full code  PAST MEDICAL HISTORY: Past Medical History:  Diagnosis Date   Abnormal uterine bleeding    Anemia    Breast cancer ([place])    Depression    Fibroid    Hormone disorder    Hypertension    Infertility, female    ALLERGIES:  is allergic to amoxicillin, penicillins, pork-derived products, penicillin g, and tamoxifen.  MEDICATIONS:  Current Outpatient Medications  Medication Sig Dispense Refill   Ascorbic Acid (VITAMIN C PO) Take 1,000 mg by mouth daily.     atenolol (TENORMIN) 50 MG tablet Take 1 tablet (50 mg total) by mouth 2 (two) times daily. 180 tablet 3   celecoxib (CELEBREX) 200 MG capsule Take 1 capsule (200 mg total) by mouth at bedtime. 30 capsule 3   Cholecalciferol (VITAMIN D3) 125 MCG (5000 UT) CAPS Take 1 capsule (5,000 Units total) by mouth daily. 30 capsule 3   lip balm (CARMEX) ointment Apply 1  application topically as needed for lip care (cold sores). 7 g 0   metroNIDAZOLE (METROGEL) 0.75 % gel Apply topically 2 (two) times daily. 45 g 1   Multiple Vitamins-Minerals (MULTIVITAMIN WITH MINERALS) tablet FLORADIX Supplement-Iron +minerals     Nutritional Supplements (,FEEDING SUPPLEMENT, PROSOURCE PLUS) liquid Take 30 mLs by mouth 4 (four) times daily - after meals and at bedtime.     ondansetron (ZOFRAN) 8 MG tablet Take by mouth.     polyethylene glycol (MIRALAX / GLYCOLAX) 17 g packet Take 17 g by mouth daily. 30 each 0   potassium chloride SA (KLOR-CON M) 20 MEQ tablet Take 1 tablet by mouth daily. 30 tablet 3   Silver-Carboxymethylcellulose (AQUACEL AG ADVANTAGE) 4"X5" PADS Apply 1 Pad topically in the morning and at bedtime. 60 each 3   No current facility-administered medications for this visit.    VITAL SIGNS: There were no vitals taken for this visit. There were no vitals filed for this visit.   Estimated body mass index is 22.13 kg/m as calculated from the following:   Height as of 01/08/23: 5\' 5"  (1.651 m).   Weight as of 01/08/23: 133 lb (60.3 kg).  PERFORMANCE STATUS (ECOG) : 0 - Asymptomatic  Assessment NAD Normal breathing pattern AAO x4  IMPRESSION: I saw Misty Arnold during her infusion. No acute distress. Resting count bili in recliner.  Denies any nausea, vomiting, constipation, or diarrhea.  Denies any pain or discomfort.  States she does have occasional aches however this is well-controlled by over-the-counter medications as needed.  Patient expresses some concern with recent findings of disease progression.  She is continuing to remain hopeful expressing she is doing everything on her in to help support her health and wishes to continue with treatment with watchful waiting.  Emotional support provided.  Misty Arnold is clear that she wishes to take things one day at a time allow her every opportunity to continue to thrive and with hopes of some  stability.   PLAN: Much improved. Pain is resolved. Occasional Tylenol use for minor aches and headaches. Appetite much improved. Weight stable.  Right breast wound healing.  Aquacel dressing provided. Some disease progression however patient is continuing to remain hopeful.  Ongoing goals of care discussions and support provided as needed. I will plan to see her back in 2-3 weeks.  Patient knows to contact office sooner if needed.  Patient expressed understanding and was in agreement with this plan. She also understands that She can call the clinic at any time with any questions, concerns, or complaints.     Time Total: 30 min   Visit consisted of counseling and education dealing with the complex and emotionally intense issues of symptom management and palliative care in the setting of serious and potentially life-threatening illness.Greater than 50%  of this time was spent counseling and coordinating care related to the above assessment and plan.  [name], AGPCNP-BC  Palliative Medicine Team/[facility]

## 2023-01-28 MED FILL — Dexamethasone Sodium Phosphate Inj 100 MG/10ML: INTRAMUSCULAR | Qty: 1 | Status: AC

## 2023-01-29 ENCOUNTER — Inpatient Hospital Stay (HOSPITAL_BASED_OUTPATIENT_CLINIC_OR_DEPARTMENT_OTHER): Payer: Medicare PPO | Admitting: Nurse Practitioner

## 2023-01-29 ENCOUNTER — Inpatient Hospital Stay: Payer: Medicare PPO

## 2023-01-29 ENCOUNTER — Other Ambulatory Visit: Payer: Self-pay

## 2023-01-29 ENCOUNTER — Inpatient Hospital Stay (HOSPITAL_BASED_OUTPATIENT_CLINIC_OR_DEPARTMENT_OTHER): Payer: Medicare PPO | Admitting: Adult Health

## 2023-01-29 DIAGNOSIS — R530 Neoplastic (malignant) related fatigue: Secondary | ICD-10-CM | POA: Diagnosis not present

## 2023-01-29 DIAGNOSIS — Z5111 Encounter for antineoplastic chemotherapy: Secondary | ICD-10-CM | POA: Diagnosis not present

## 2023-01-29 DIAGNOSIS — C50919 Malignant neoplasm of unspecified site of unspecified female breast: Secondary | ICD-10-CM | POA: Diagnosis not present

## 2023-01-29 DIAGNOSIS — Z515 Encounter for palliative care: Secondary | ICD-10-CM

## 2023-01-29 DIAGNOSIS — C7949 Secondary malignant neoplasm of other parts of nervous system: Secondary | ICD-10-CM | POA: Diagnosis not present

## 2023-01-29 DIAGNOSIS — Z170 Estrogen receptor positive status [ER+]: Secondary | ICD-10-CM | POA: Diagnosis not present

## 2023-01-29 DIAGNOSIS — Z7189 Other specified counseling: Secondary | ICD-10-CM

## 2023-01-29 DIAGNOSIS — C50411 Malignant neoplasm of upper-outer quadrant of right female breast: Secondary | ICD-10-CM

## 2023-01-29 LAB — CBC WITH DIFFERENTIAL (CANCER CENTER ONLY)
Abs Immature Granulocytes: 0.02 10*3/uL (ref 0.00–0.07)
Basophils Absolute: 0 10*3/uL (ref 0.0–0.1)
Basophils Relative: 1 %
Eosinophils Absolute: 0.1 10*3/uL (ref 0.0–0.5)
Eosinophils Relative: 2 %
HCT: 39.1 % (ref 36.0–46.0)
Hemoglobin: 13.3 g/dL (ref 12.0–15.0)
Immature Granulocytes: 1 %
Lymphocytes Relative: 18 %
Lymphs Abs: 0.7 10*3/uL (ref 0.7–4.0)
MCH: 31.1 pg (ref 26.0–34.0)
MCHC: 34 g/dL (ref 30.0–36.0)
MCV: 91.6 fL (ref 80.0–100.0)
Monocytes Absolute: 0.3 10*3/uL (ref 0.1–1.0)
Monocytes Relative: 7 %
Neutro Abs: 2.9 10*3/uL (ref 1.7–7.7)
Neutrophils Relative %: 71 %
Platelet Count: 353 10*3/uL (ref 150–400)
RBC: 4.27 MIL/uL (ref 3.87–5.11)
RDW: 15.2 % (ref 11.5–15.5)
WBC Count: 4 10*3/uL (ref 4.0–10.5)
nRBC: 0 % (ref 0.0–0.2)

## 2023-01-29 LAB — CMP (CANCER CENTER ONLY)
ALT: 14 U/L (ref 0–44)
AST: 25 U/L (ref 15–41)
Albumin: 4.3 g/dL (ref 3.5–5.0)
Alkaline Phosphatase: 124 U/L (ref 38–126)
Anion gap: 9 (ref 5–15)
BUN: 13 mg/dL (ref 6–20)
CO2: 27 mmol/L (ref 22–32)
Calcium: 10 mg/dL (ref 8.9–10.3)
Chloride: 109 mmol/L (ref 98–111)
Creatinine: 0.82 mg/dL (ref 0.44–1.00)
GFR, Estimated: >60 mL/min (ref >60)
Glucose, Bld: 106 mg/dL — ABNORMAL HIGH (ref 70–99)
Potassium: 3.5 mmol/L (ref 3.5–5.1)
Sodium: 145 mmol/L (ref 135–145)
Total Bilirubin: 0.4 mg/dL (ref 0.3–1.2)
Total Protein: 7.3 g/dL (ref 6.5–8.1)

## 2023-01-29 LAB — VITAMIN D 25 HYDROXY (VIT D DEFICIENCY, FRACTURES): Vit D, 25-Hydroxy: 68.99 ng/mL (ref 30–100)

## 2023-01-29 MED ORDER — ACETAMINOPHEN 325 MG PO TABS
650.0000 mg | ORAL_TABLET | Freq: Once | ORAL | Status: AC
  Administered 2023-01-29: 650 mg via ORAL
  Filled 2023-01-29: qty 2

## 2023-01-29 MED ORDER — PALONOSETRON HCL INJECTION 0.25 MG/5ML
0.2500 mg | Freq: Once | INTRAVENOUS | Status: AC
  Administered 2023-01-29: 0.25 mg via INTRAVENOUS
  Filled 2023-01-29: qty 5

## 2023-01-29 MED ORDER — SODIUM CHLORIDE 0.9 % IV SOLN
10.0000 mg | Freq: Once | INTRAVENOUS | Status: AC
  Administered 2023-01-29: 10 mg via INTRAVENOUS
  Filled 2023-01-29: qty 10

## 2023-01-29 MED ORDER — FAM-TRASTUZUMAB DERUXTECAN-NXKI CHEMO 100 MG IV SOLR
5.4000 mg/kg | Freq: Once | INTRAVENOUS | Status: AC
  Administered 2023-01-29: 340 mg via INTRAVENOUS
  Filled 2023-01-29: qty 17

## 2023-01-29 MED ORDER — DEXTROSE 5 % IV SOLN: Freq: Once | INTRAVENOUS | Status: AC

## 2023-01-29 MED ORDER — DIPHENHYDRAMINE HCL 25 MG PO CAPS
50.0000 mg | ORAL_CAPSULE | Freq: Once | ORAL | Status: AC
  Administered 2023-01-29: 50 mg via ORAL
  Filled 2023-01-29: qty 2

## 2023-01-29 NOTE — Progress Notes (Signed)
[facility] Follow up:    Misty Lose, MD [place] [id number]   DIAGNOSIS:  Cancer Staging  Breast cancer of upper-outer quadrant of right female breast [facility]) Staging form: Breast, AJCC 7th Edition - Clinical: Stage IV (T4b, N0, M1) - Signed by Misty Lose, MD on 12/18/2022   SUMMARY OF ONCOLOGIC HISTORY: Oncology History  Breast cancer of upper-outer quadrant of right female breast ([place])  09/21/2010 Mammogram   right breast linear and segmental pleomorphic calcifications from 4:00 to 6:00 position ultrasound revealed 1.6 cm and 1.1 cm masses   09/27/2010 Initial Biopsy   ultrasound-guided biopsy of all masses showed DCIS grade 2; patient went to cancer treatment centers of [place] for second opinion and delayed therapy   01/26/2011 Surgery   right mastectomy followed by reconstruction: 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 But she could not afford these trips.   01/08/2014 Breast MRI   right breast multiple enhancing masses within the soft tissues largest 5.6 cm in wall skin surface and the capsule of the silicone prosthesis, contiguous nodules involving in inferomedial breast and tired and subcentimeter nodules across the midline   11/02/2014 Cancer Staging   Staging form: Breast, AJCC 7th Edition - Clinical: Stage IV (T4b, N0, M1) - Signed by Misty Lose, MD on 12/18/2022   08/29/2015 Surgery   Right mastectomy: IDC with invol of skin and skin ulceration, breast capsule inv cancer, superior  medial margin positive, 1/1 LN positive, grade 3, 14.3 cm, 3.5 cm, ALI, chest wall involv, ER 90-100%, PR 80-90%, HER-2 negative, Ki 67 40-50% T4CN1 (St 3B)   03/05/2016 -  Anti-estrogen oral therapy   Tamoxifen 20 mg daily stopped due to headache and uncontrolled hypertension. Decrease to 10 mg daily 04/03/2016, stopped June 2017 and took an estrogen metabolizer over-the-counter; started Aromasin April 2018 from a [place] physician, Zoladex started on 05/2017 and switched to Letrozole in 09/2017   01/04/2020 Surgery   Patient presented to the ED on 01/04/20 for worsening lower extremity numbness and underwent a decompressive laminectomy with tumor resection at T9 that was complicated with acute blood loss anemia and leukocytosis.   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[place])  01/04/2020 Initial Diagnosis   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[place])   10/17/2021 - 06/06/2022 Chemotherapy   Patient is on Treatment Plan : BREAST METASTATIC fam-trastuzumab deruxtecan-nxki (Enhertu) q21d     10/17/2021 -  Chemotherapy   Patient is on Treatment Plan : BREAST METASTATIC Fam-Trastuzumab Deruxtecan-nxki (Enhertu) (5.4) q21d  Primary malignant neoplasm of breast with metastasis ([place])  01/08/2020 Initial Diagnosis   Metastatic breast cancer ([place])   10/17/2021 - 06/06/2022 Chemotherapy   Patient is on Treatment Plan : BREAST METASTATIC fam-trastuzumab deruxtecan-nxki  (Enhertu) q21d     10/17/2021 -  Chemotherapy   Patient is on Treatment Plan : BREAST METASTATIC Fam-Trastuzumab Deruxtecan-nxki (Enhertu) (5.4) q21d       CURRENT THERAPY: Enhertu  INTERVAL HISTORY: Misty Arnold 52 y.o. female returns for evaluation prior to receiving Enhertu therapy.  She is tolerating this moderately well.  She denies any significant issues.  Her most recent restaging scans demonstrated mild progression however she wanted to focus on her detox and continue with 3 more cycles of Enhertu.  Her next CT scan has been ordered to be completed on Mar 12, 2023.  This has not yet been scheduled.  Her most recent echocardiogram occurred December 24, 2022 demonstrating a left ventricular ejection fraction of 55 to 60%.   Patient Active Problem List   Diagnosis Date Noted   Port-A-Cath in place 02/19/2022   Wound due to malignant neoplastic disease ([place]) 10/12/2021   Malnutrition of moderate degree 10/05/2021   Breast wound 08/28/2021   Infected wound 08/26/2021   Essential hypertension 08/26/2021   Wound infection 05/12/2021   Cancer related pain 05/12/2021   AKI (acute kidney injury) ([facility]) 05/11/2021   Palliative care patient 02/15/2020   Myelopathy ([place]) 01/08/2020   Incomplete paraplegia ([place]) 01/08/2020   Epidural mass    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)    Weakness of both legs    Leucocytosis    Acute blood loss anemia    Neurogenic bladder    Neurogenic bowel    S/P laminectomy 01/04/2020   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  MEDICAL HISTORY: Past Medical History:  Diagnosis Date   Abnormal uterine bleeding    Anemia    Breast cancer ([place])    Depression    Fibroid    Hormone disorder    Hypertension    Infertility, female      SURGICAL HISTORY: Past Surgical History:  Procedure Laterality Date   ABDOMINAL HYSTERECTOMY     BREAST SURGERY     LAMINECTOMY N/A 01/04/2020   Procedure: Thoracic Eight, Thoracic Nine THORACIC LAMINECTOMY FOR TUMOR with Thoracic Seven-Thoracic Ten instrumentation;  Surgeon: [name], MD;  Location: [facility];  Service: Neurosurgery;  Laterality: N/A;  Thoracic Eight, Thoracic Nine THORACIC LAMINECTOMY FOR TUMOR with Thoracic Seven-Thoracic Ten instrumentation   MASTECTOMY     salivary gland stone      SOCIAL HISTORY: Social History   Socioeconomic History   Marital status: Divorced    Spouse name: Not on file   Number of children: Not on file   Years of education: 12   Highest education level: Not on file  Occupational History   Occupation: disabled  Tobacco Use   Smoking status: Never   Smokeless tobacco: Never  Vaping Use   Vaping Use: Never used  Substance and Sexual Activity   Alcohol use: Not Currently   Drug use: No   Sexual activity: Yes    Partners: Male    Birth control/protection: Surgical, Condom  Other Topics Concern   Not on file  Social History Narrative   Lives at home with her grandmother  Right handed   Caffeine seldom, coffee once or twice monthly   Social Determinants of Health   Financial Resource Strain: Not on file  Food Insecurity: Not on file  Transportation Needs: Not on file  Physical Activity: Not on file  Stress: Not on file  Social Connections: Not on file  Intimate Partner Violence: Not on file    FAMILY HISTORY: Family History  Problem Relation Age of Onset   Stroke Mother    Heart attack Mother    Diabetes Mother    Hyperlipidemia Mother    Heart disease Mother    Diabetes Father    Stroke Father    Stroke Maternal Grandmother    Cancer Maternal Grandfather    Colon cancer Maternal Grandfather    Stroke Maternal Grandfather    Stroke Paternal Grandmother    Heart attack Paternal Grandmother    Heart failure  Paternal Grandmother    Diabetes Paternal Grandmother    Hyperlipidemia Paternal Grandmother    CAD Paternal Grandmother    Cancer Paternal Grandfather    Colon cancer Paternal Grandfather    Cancer Maternal Aunt 27       breast   Stroke Maternal Aunt    Heart attack Maternal Aunt    Breast cancer Maternal Aunt     Review of Systems  Constitutional:  Negative for appetite change, chills, fatigue, fever and unexpected weight change.  HENT:   Negative for hearing loss, lump/mass and trouble swallowing.   Eyes:  Negative for eye problems and icterus.  Respiratory:  Negative for chest tightness, cough and shortness of breath.   Cardiovascular:  Negative for chest pain, leg swelling and palpitations.  Gastrointestinal:  Negative for abdominal distention, abdominal pain, constipation, diarrhea, nausea and vomiting.  Endocrine: Negative for hot flashes.  Genitourinary:  Negative for difficulty urinating.   Musculoskeletal:  Negative for arthralgias.  Skin:  Negative for itching and rash.  Neurological:  Negative for dizziness, extremity weakness, headaches and numbness.  Hematological:  Negative for adenopathy. Does not bruise/bleed easily.  Psychiatric/Behavioral:  Negative for depression. The patient is not nervous/anxious.       PHYSICAL EXAMINATION  ECOG PERFORMANCE STATUS: 0 - Asymptomatic  Vitals:   01/29/23 1335  BP: (!) 166/95  Pulse: 90  Resp: 18  Temp: 98.6 F (37 C)  SpO2: 100%    Physical Exam Constitutional:      General: She is not in acute distress.    Appearance: Normal appearance. She is not toxic-appearing.  HENT:     Head: Normocephalic and atraumatic.  Eyes:     General: No scleral icterus. Cardiovascular:     Rate and Rhythm: Normal rate and regular rhythm.     Pulses: Normal pulses.     Heart sounds: Normal heart sounds.  Pulmonary:     Effort: Pulmonary effort is normal.     Breath sounds: Normal breath sounds.  Abdominal:     General:  Abdomen is flat. Bowel sounds are normal. There is no distension.     Palpations: Abdomen is soft.     Tenderness: There is no abdominal tenderness.  Musculoskeletal:        General: No swelling.     Cervical back: Neck supple.  Lymphadenopathy:     Cervical: No cervical adenopathy.     Upper Body:     Right upper body: Axillary adenopathy present.     Left upper body: Axillary adenopathy present.  Skin:    General: Skin is warm  and dry.     Findings: No rash.  Neurological:     General: No focal deficit present.     Mental Status: She is alert.  Psychiatric:        Mood and Affect: Mood normal.        Behavior: Behavior normal.     LABORATORY DATA:  CBC    Component Value Date/Time   WBC 4.0 01/29/2023 1214   WBC 5.9 10/16/2021 1101   RBC 4.27 01/29/2023 1214   HGB 13.3 01/29/2023 1214   HGB 13.1 11/01/2017 1417   HCT 39.1 01/29/2023 1214   HCT 40.1 11/01/2017 1417   PLT 353 01/29/2023 1214   PLT 227 11/01/2017 1417   MCV 91.6 01/29/2023 1214   MCV 89.3 11/01/2017 1417   MCH 31.1 01/29/2023 1214   MCHC 34.0 01/29/2023 1214   RDW 15.2 01/29/2023 1214   RDW 16.0 (H) 11/01/2017 1417   LYMPHSABS 0.7 01/29/2023 1214   LYMPHSABS 2.5 11/01/2017 1417   MONOABS 0.3 01/29/2023 1214   MONOABS 0.7 11/01/2017 1417   EOSABS 0.1 01/29/2023 1214   EOSABS 0.1 11/01/2017 1417   BASOSABS 0.0 01/29/2023 1214   BASOSABS 0.0 11/01/2017 1417    CMP     Component Value Date/Time   NA 145 01/29/2023 1214   NA 141 11/01/2017 1417   K 3.5 01/29/2023 1214   K 3.5 11/01/2017 1417   CL 109 01/29/2023 1214   CO2 27 01/29/2023 1214   CO2 26 11/01/2017 1417   GLUCOSE 106 (H) 01/29/2023 1214   GLUCOSE 81 11/01/2017 1417   BUN 13 01/29/2023 1214   BUN 15.3 11/01/2017 1417   CREATININE 0.82 01/29/2023 1214   CREATININE 0.8 11/01/2017 1417   CALCIUM 10.0 01/29/2023 1214   CALCIUM 8.9 11/01/2017 1417   PROT 7.3 01/29/2023 1214   PROT 6.5 11/01/2017 1417   ALBUMIN 4.3 01/29/2023  1214   ALBUMIN 3.6 11/01/2017 1417   AST 25 01/29/2023 1214   AST 12 11/01/2017 1417   ALT 14 01/29/2023 1214   ALT 14 11/01/2017 1417   ALKPHOS 124 01/29/2023 1214   ALKPHOS 121 11/01/2017 1417   BILITOT 0.4 01/29/2023 1214   BILITOT 0.23 11/01/2017 1417   GFRNONAA >60 01/29/2023 1214   GFRAA >60 08/04/2020 1521        ASSESSMENT and THERAPY PLAN:   Breast cancer of upper-outer quadrant of right female breast ([place]) Misty Arnold is a 52 year old woman with stage IV breast cancer on Enhertu therapy.  She is tolerating treatment well and will proceed with therapy today.  Her CT chest has not yet been scheduled for May 7 and I sent my nurse a message to help get this underway.  Her echocardiogram was last completed in February and showed a normal ejection fraction will need to be repeated in May.  She is not having any significant issues today and will proceed with therapy her labs are stable.  We will see her back in 3 weeks for labs, follow-up, and her next treatment.   All questions were answered. The patient knows to call the clinic with any problems, questions or concerns. We can certainly see the patient much sooner if necessary.  Total encounter time:20 minutes*in face-to-face visit time, chart review, lab review, care coordination, order entry, and documentation of the encounter time.  Wilber Bihari, NP 01/29/23 2:40 PM Medical Oncology and Hematology [facility] [place], [place] [address] Tel. [phone]    Fax. [phone]  *  Total Encounter Time as defined by the Centers for Medicare and Medicaid Services includes, in addition to the face-to-face time of a patient visit (documented in the note above) non-face-to-face time: obtaining and reviewing outside history, ordering and reviewing medications, tests or procedures, care coordination (communications with other health care professionals or caregivers) and documentation in the medical  record.

## 2023-01-29 NOTE — Assessment & Plan Note (Signed)
Albertina Parr is a 52 year old woman with stage IV breast cancer on Enhertu therapy.  She is tolerating treatment well and will proceed with therapy today.  Her CT chest has not yet been scheduled for May 7 and I sent my nurse a message to help get this underway.  Her echocardiogram was last completed in February and showed a normal ejection fraction will need to be repeated in May.  She is not having any significant issues today and will proceed with therapy her labs are stable.  We will see her back in 3 weeks for labs, follow-up, and her next treatment.

## 2023-01-29 NOTE — Patient Instructions (Signed)
[facility]  Discharge Instructions: Thank you for choosing [facility] to provide your oncology and hematology care.   If you have a lab appointment with the Cancer Center, please go directly to the Cancer Center and check in at the registration area.   Wear comfortable clothing and clothing appropriate for easy access to any Portacath or PICC line.   We strive to give you quality time with your provider. You may need to reschedule your appointment if you arrive late (15 or more minutes).  Arriving late affects you and other patients whose appointments are after yours.  Also, if you miss three or more appointments without notifying the office, you may be dismissed from the clinic at the provider's discretion.      For prescription refill requests, have your pharmacy contact our office and allow 72 hours for refills to be completed.    Today you received the following chemotherapy and/or immunotherapy agents: fam-trastuzumab deruxtecan       To help prevent nausea and vomiting after your treatment, we encourage you to take your nausea medication as directed.  BELOW ARE SYMPTOMS THAT SHOULD BE REPORTED IMMEDIATELY: *FEVER GREATER THAN 100.4 F (38 C) OR HIGHER *CHILLS OR SWEATING *NAUSEA AND VOMITING THAT IS NOT CONTROLLED WITH YOUR NAUSEA MEDICATION *UNUSUAL SHORTNESS OF BREATH *UNUSUAL BRUISING OR BLEEDING *URINARY PROBLEMS (pain or burning when urinating, or frequent urination) *BOWEL PROBLEMS (unusual diarrhea, constipation, pain near the anus) TENDERNESS IN MOUTH AND THROAT WITH OR WITHOUT PRESENCE OF ULCERS (sore throat, sores in mouth, or a toothache) UNUSUAL RASH, SWELLING OR PAIN  UNUSUAL VAGINAL DISCHARGE OR ITCHING   Items with * indicate a potential emergency and should be followed up as soon as possible or go to the Emergency Department if any problems should occur.  Please show the CHEMOTHERAPY ALERT CARD or IMMUNOTHERAPY  ALERT CARD at check-in to the Emergency Department and triage nurse.  Should you have questions after your visit or need to cancel or reschedule your appointment, please contact [facility]  Dept: [phone]  and follow the prompts.  Office hours are 8:00 a.m. to 4:30 p.m. Monday - Friday. Please note that voicemails left after 4:00 p.m. may not be returned until the following business day.  We are closed weekends and major holidays. You have access to a nurse at all times for urgent questions. Please call the main number to the clinic Dept: [phone] and follow the prompts.   For any non-urgent questions, you may also contact your provider using MyChart. We now offer e-Visits for anyone 18 and older to request care online for non-urgent symptoms. For details visit [email or web address].   Also download the MyChart app! Go to the app store, search "MyChart", open the app, select [facility], and log in with your MyChart username and password.   

## 2023-01-30 ENCOUNTER — Telehealth: Payer: Self-pay

## 2023-01-30 ENCOUNTER — Telehealth: Payer: Self-pay | Admitting: Hematology and Oncology

## 2023-01-30 ENCOUNTER — Encounter: Payer: Self-pay | Admitting: Nurse Practitioner

## 2023-01-30 ENCOUNTER — Encounter: Payer: Self-pay | Admitting: Hematology and Oncology

## 2023-01-30 NOTE — Telephone Encounter (Signed)
Pt instructed to call central scheduling to have CT scheduled either 5/1 or 5/2 so results would be available for 5/7 appt with MD. She verbalized understanding and states she will call

## 2023-01-30 NOTE — Telephone Encounter (Signed)
Reached out to patient for scheduling per [place], left voicemail.

## 2023-02-03 LAB — ESTRADIOL, ULTRA SENS: Estradiol, Sensitive: <2.5 pg/mL

## 2023-02-14 NOTE — Progress Notes (Signed)
Palliative Medicine [facility]  [phone] [phone]   Name: Misty LadeFrederica Reier Date: 02/14/2023 MRN: [id number]  DOB: May 31, 1971  Patient Care Team: Serena CroissantGudena, Vinay, MD as PCP - General (Hematology and Oncology)    INTERVAL HISTORY: Misty Arnold is a 52 y.o. female with right metastatic breast cancer s/p mastectomy, large right chest wound secondary to necrotic mass, weankess due to T9 vertebral compression s/p tumor resection and decompressive laminectomy, and hypertension. Patient did not receive treatment for some time after seeking care in [place]. Currently receiving Enhertu treatment. Palliative following for symptom management and goals of care.   SOCIAL HISTORY:    Misty Arnold reports that she has never smoked. She has never used smokeless tobacco. She reports that she does not currently use alcohol. She reports that she does not use drugs.  ADVANCE DIRECTIVES:  Advance directives completed during recent hospitalization 08/27/2021.  Documents are on file and have been reviewed and [name].  CODE STATUS: Full code  PAST MEDICAL HISTORY: Past Medical History:  Diagnosis Date   Abnormal uterine bleeding    Anemia    Breast cancer (HCC)    Depression    Fibroid    Hormone disorder    Hypertension    Infertility, female    ALLERGIES:  is allergic to amoxicillin, penicillins, pork-derived products, penicillin g, and tamoxifen.  MEDICATIONS:  Current Outpatient Medications  Medication Sig Dispense Refill   Ascorbic Acid (VITAMIN C PO) Take 1,000 mg by mouth daily. (Patient not taking: Reported on 01/29/2023)     atenolol (TENORMIN) 50 MG tablet Take 1 tablet (50 mg total) by mouth 2 (two) times daily. 180 tablet 3   celecoxib (CELEBREX) 200 MG capsule Take 1 capsule (200 mg total) by mouth at bedtime. 30 capsule 3   Cholecalciferol (VITAMIN D3) 125 MCG (5000 UT) CAPS Take 1 capsule (5,000 Units total) by mouth daily. 30 capsule 3   lip  balm (CARMEX) ointment Apply 1 application topically as needed for lip care (cold sores). 7 g 0   metroNIDAZOLE (METROGEL) 0.75 % gel Apply topically 2 (two) times daily. 45 g 1   Multiple Vitamins-Minerals (MULTIVITAMIN WITH MINERALS) tablet FLORADIX Supplement-Iron +minerals     Nutritional Supplements (,FEEDING SUPPLEMENT, PROSOURCE PLUS) liquid Take 30 mLs by mouth 4 (four) times daily - after meals and at bedtime.     ondansetron (ZOFRAN) 8 MG tablet Take by mouth.     polyethylene glycol (MIRALAX / GLYCOLAX) 17 g packet Take 17 g by mouth daily. 30 each 0   potassium chloride SA (KLOR-CON M) 20 MEQ tablet Take 1 tablet by mouth daily. 30 tablet 3   Silver-Carboxymethylcellulose (AQUACEL AG ADVANTAGE) 4"X5" PADS Apply 1 Pad topically in the morning and at bedtime. 60 each 3   No current facility-administered medications for this visit.    VITAL SIGNS: There were no vitals taken for this visit. There were no vitals filed for this visit.   Estimated body mass index is 22.45 kg/m as calculated from the following:   Height as of an earlier encounter on 02/19/23: 5\' 5"  (1.651 m).   Weight as of an earlier encounter on 02/19/23: 61.2 kg.  PERFORMANCE STATUS (ECOG) : 0 - Asymptomatic  Assessment NAD Normal breathing pattern AAO x4  IMPRESSION: Misty Arnold is seen in infusion area. She is alone during this visit. Observed ambulating independently from bathroom to recliner in infusion area. She is in good spirits and denies symptom issues.  PLAN: Pain resolved. Patient denies use of Tylenol, Oxycodone, or other pain relievers.  Reports use of Diazepam about once weekly to help her sleep. Appetite remains good. Weight stable at 61.2 kg today and previous visit.  Right breast wound healing. ABD pads provided. Some disease progression however patient is continuing to remain hopeful.  Ongoing goals of care discussions and support provided as needed. Palliative will plan to see her back in  2-3 weeks.  Patient knows to contact office sooner if needed.  Patient expressed understanding and was in agreement with this plan. She also understands that she can call the clinic at any time with any questions, concerns, or complaints.   Visit consisted of counseling and education dealing with the complex and emotionally intense issues of symptom management and palliative care in the setting of serious and potentially life-threatening illness.Greater than 50%  of this time was spent counseling and coordinating care related to the above assessment and plan.   Signed by: Katy Apo, RN MSN [facility] / NP Student   Willette Alma, AGPCNP-BC  Palliative Medicine Team/[facility]

## 2023-02-15 ENCOUNTER — Telehealth: Payer: Self-pay

## 2023-02-15 NOTE — Telephone Encounter (Signed)
Called and LVM regarding medical expense verification fax received. Advised Pt to call billing at [phone] and ask for an itemized statement. Encouraged Pt to bring statement in at her next OV on 02/19/23 so we can take another look. Gave call back number.  Reached out to financial resource support and SW who relayed that billing should be a help to complete form. Email sent to [employer].

## 2023-02-18 MED FILL — Dexamethasone Sodium Phosphate Inj 100 MG/10ML: INTRAMUSCULAR | Qty: 1 | Status: AC

## 2023-02-18 NOTE — Progress Notes (Signed)
Patient Care Team: Serena Croissant, MD as PCP - General (Hematology and Oncology)  DIAGNOSIS: No diagnosis found.  SUMMARY OF ONCOLOGIC HISTORY: Oncology History  Breast cancer of upper-outer quadrant of right female breast  09/21/2010 Mammogram   right breast linear and segmental pleomorphic calcifications from 4:00 to 6:00 position ultrasound revealed 1.6 cm and 1.1 cm masses   09/27/2010 Initial Biopsy   ultrasound-guided biopsy of all masses showed DCIS grade 2; patient went to cancer treatment centers of [place] for second opinion and delayed therapy   01/26/2011 Surgery   right mastectomy followed by reconstruction: Invasive ductal carcinoma T1 N1 MIC M0 stage IB ER/PR positive HER-2 negative, BRCA negative, Oncotype DX low risk   05/29/2012 Procedure   right chest wall nodule excision done on 06/24/2012 showed metastatic carcinoma margins were positive, but CT scan no metastatic disease, recommended chemotherapy but patient refused also refused reexcision   04/28/2013 Treatment Plan Change   patient went to [place] for alternative treatments and took herbal medications, tonics etc. But she could not afford these trips.   01/08/2014 Breast MRI   right breast multiple enhancing masses within the soft tissues largest 5.6 cm in wall skin surface and the capsule of the silicone prosthesis, contiguous nodules involving in inferomedial breast and tired and subcentimeter nodules across the midline   11/02/2014 Cancer Staging   Staging form: Breast, AJCC 7th Edition - Clinical: Stage IV (T4b, N0, M1) - Signed by Serena Croissant, MD on 12/18/2022   08/29/2015 Surgery   Right mastectomy: IDC with invol of skin and skin ulceration, breast capsule inv cancer, superior medial margin positive, 1/1 LN positive, grade 3, 14.3 cm, 3.5 cm, ALI, chest wall involv, ER 90-100%, PR 80-90%, HER-2 negative, Ki 67 40-50% T4CN1 (St 3B)   03/05/2016 -  Anti-estrogen oral therapy   Tamoxifen 20 mg daily stopped  due to headache and uncontrolled hypertension. Decrease to 10 mg daily 04/03/2016, stopped June 2017 and took an estrogen metabolizer over-the-counter; started Aromasin April 2018 from a [place] physician, Zoladex started on 05/2017 and switched to Letrozole in 09/2017   01/04/2020 Surgery   Patient presented to the ED on 01/04/20 for worsening lower extremity numbness and underwent a decompressive laminectomy with tumor resection at T9 that was complicated with acute blood loss anemia and leukocytosis.   01/28/2020 - 02/15/2020 Radiation Therapy   Palliative radiation at site of thoracic tumor resection   03/03/2020 Miscellaneous    Ibrance with letrozole and Zoladex    Miscellaneous   Guardant 360: ESR 1 mutations, PI K3 CA mutation, EGFR mutation, T p53 mutation   10/17/2021 - 06/06/2022 Chemotherapy   Patient is on Treatment Plan : BREAST METASTATIC fam-trastuzumab deruxtecan-nxki (Enhertu) q21d     10/17/2021 -  Chemotherapy   Patient is on Treatment Plan : BREAST METASTATIC Fam-Trastuzumab Deruxtecan-nxki (Enhertu) (5.4) q21d     Metastasis of neoplasm to spinal canal  01/04/2020 Initial Diagnosis   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)   10/17/2021 - 06/06/2022 Chemotherapy   Patient is on Treatment Plan : BREAST METASTATIC fam-trastuzumab deruxtecan-nxki (Enhertu) q21d     10/17/2021 -  Chemotherapy   Patient is on Treatment Plan : BREAST METASTATIC Fam-Trastuzumab Deruxtecan-nxki (Enhertu) (5.4) q21d     Primary malignant neoplasm of breast with metastasis  01/08/2020 Initial Diagnosis   Metastatic breast cancer (HCC)   10/17/2021 - 06/06/2022 Chemotherapy   Patient is on Treatment Plan : BREAST METASTATIC fam-trastuzumab deruxtecan-nxki (Enhertu) q21d  10/17/2021 -  Chemotherapy   Patient is on Treatment Plan : BREAST METASTATIC Fam-Trastuzumab Deruxtecan-nxki (Enhertu) (5.4) q21d       CHIEF COMPLIANT:   INTERVAL HISTORY: Misty Arnold is a   ALLERGIES:  is  allergic to amoxicillin, penicillins, pork-derived products, penicillin g, and tamoxifen.  MEDICATIONS:  Current Outpatient Medications  Medication Sig Dispense Refill   Ascorbic Acid (VITAMIN C PO) Take 1,000 mg by mouth daily. (Patient not taking: Reported on 01/29/2023)     atenolol (TENORMIN) 50 MG tablet Take 1 tablet (50 mg total) by mouth 2 (two) times daily. 180 tablet 3   celecoxib (CELEBREX) 200 MG capsule Take 1 capsule (200 mg total) by mouth at bedtime. 30 capsule 3   Cholecalciferol (VITAMIN D3) 125 MCG (5000 UT) CAPS Take 1 capsule (5,000 Units total) by mouth daily. 30 capsule 3   lip balm (CARMEX) ointment Apply 1 application topically as needed for lip care (cold sores). 7 g 0   metroNIDAZOLE (METROGEL) 0.75 % gel Apply topically 2 (two) times daily. 45 g 1   Multiple Vitamins-Minerals (MULTIVITAMIN WITH MINERALS) tablet FLORADIX Supplement-Iron +minerals     Nutritional Supplements (,FEEDING SUPPLEMENT, PROSOURCE PLUS) liquid Take 30 mLs by mouth 4 (four) times daily - after meals and at bedtime.     ondansetron (ZOFRAN) 8 MG tablet Take by mouth.     polyethylene glycol (MIRALAX / GLYCOLAX) 17 g packet Take 17 g by mouth daily. 30 each 0   potassium chloride SA (KLOR-CON M) 20 MEQ tablet Take 1 tablet by mouth daily. 30 tablet 3   Silver-Carboxymethylcellulose (AQUACEL AG ADVANTAGE) 4"X5" PADS Apply 1 Pad topically in the morning and at bedtime. 60 each 3   No current facility-administered medications for this visit.    PHYSICAL EXAMINATION: ECOG PERFORMANCE STATUS: {CHL ONC ECOG PS:[id number]}  There were no vitals filed for this visit. There were no vitals filed for this visit.  BREAST:*** No palpable masses or nodules in either right or left breasts. No palpable axillary supraclavicular or infraclavicular adenopathy no breast tenderness or nipple discharge. (exam performed in the presence of a chaperone)  LABORATORY DATA:  I have reviewed the data as listed     Latest Ref Rng & Units 01/29/2023   12:14 PM 01/08/2023    1:48 PM 12/18/2022   10:19 AM  CMP  Glucose 70 - 99 mg/dL 462  863  84   BUN 6 - 20 mg/dL 13  14  15    Creatinine 0.44 - 1.00 mg/dL 8.17  7.11  6.57   Sodium 135 - 145 mmol/L 145  143  141   Potassium 3.5 - 5.1 mmol/L 3.5  3.3  3.9   Chloride 98 - 111 mmol/L 109  105  108   CO2 22 - 32 mmol/L 27  30  26    Calcium 8.9 - 10.3 mg/dL 90.3  9.5  83.3   Total Protein 6.5 - 8.1 g/dL 7.3  7.4  7.4   Total Bilirubin 0.3 - 1.2 mg/dL 0.4  0.7  0.3   Alkaline Phos 38 - 126 U/L 124  125  140   AST 15 - 41 U/L 25  21  25    ALT 0 - 44 U/L 14  14  16      Lab Results  Component Value Date   WBC 4.0 01/29/2023   HGB 13.3 01/29/2023   HCT 39.1 01/29/2023   MCV 91.6 01/29/2023   PLT 353 01/29/2023   NEUTROABS 2.9  01/29/2023    ASSESSMENT & PLAN:  No problem-specific Assessment & Plan notes found for this encounter.    No orders of the defined types were placed in this encounter.  The patient has a good understanding of the overall plan. she agrees with it. she will call with any problems that may develop before the next visit here. Total time spent: 30 mins including face to face time and time spent for planning, charting and co-ordination of care   Sherlyn Lick, CMA 02/18/23    I Janan Ridge am acting as a Neurosurgeon for [employer]  ***

## 2023-02-19 ENCOUNTER — Inpatient Hospital Stay: Payer: Medicare PPO

## 2023-02-19 ENCOUNTER — Encounter: Payer: Self-pay | Admitting: *Deleted

## 2023-02-19 ENCOUNTER — Inpatient Hospital Stay (HOSPITAL_BASED_OUTPATIENT_CLINIC_OR_DEPARTMENT_OTHER): Payer: Medicare PPO | Admitting: Nurse Practitioner

## 2023-02-19 ENCOUNTER — Inpatient Hospital Stay (HOSPITAL_BASED_OUTPATIENT_CLINIC_OR_DEPARTMENT_OTHER): Payer: Medicare PPO | Admitting: Hematology and Oncology

## 2023-02-19 ENCOUNTER — Inpatient Hospital Stay: Payer: Medicare PPO | Attending: Hematology and Oncology

## 2023-02-19 ENCOUNTER — Other Ambulatory Visit: Payer: Self-pay

## 2023-02-19 VITALS — BP 157/89 | HR 83 | Temp 97.6°F | Resp 18 | Ht 65.0 in | Wt 134.9 lb

## 2023-02-19 DIAGNOSIS — Z5111 Encounter for antineoplastic chemotherapy: Secondary | ICD-10-CM | POA: Insufficient documentation

## 2023-02-19 DIAGNOSIS — C50919 Malignant neoplasm of unspecified site of unspecified female breast: Secondary | ICD-10-CM

## 2023-02-19 DIAGNOSIS — C7949 Secondary malignant neoplasm of other parts of nervous system: Secondary | ICD-10-CM

## 2023-02-19 DIAGNOSIS — C50411 Malignant neoplasm of upper-outer quadrant of right female breast: Secondary | ICD-10-CM

## 2023-02-19 DIAGNOSIS — C787 Secondary malignant neoplasm of liver and intrahepatic bile duct: Secondary | ICD-10-CM | POA: Insufficient documentation

## 2023-02-19 DIAGNOSIS — Z7189 Other specified counseling: Secondary | ICD-10-CM | POA: Diagnosis not present

## 2023-02-19 DIAGNOSIS — Z515 Encounter for palliative care: Secondary | ICD-10-CM | POA: Diagnosis not present

## 2023-02-19 DIAGNOSIS — R530 Neoplastic (malignant) related fatigue: Secondary | ICD-10-CM | POA: Diagnosis not present

## 2023-02-19 DIAGNOSIS — Z170 Estrogen receptor positive status [ER+]: Secondary | ICD-10-CM | POA: Diagnosis not present

## 2023-02-19 DIAGNOSIS — Z9011 Acquired absence of right breast and nipple: Secondary | ICD-10-CM | POA: Diagnosis not present

## 2023-02-19 DIAGNOSIS — C7951 Secondary malignant neoplasm of bone: Secondary | ICD-10-CM | POA: Diagnosis not present

## 2023-02-19 DIAGNOSIS — Z923 Personal history of irradiation: Secondary | ICD-10-CM | POA: Diagnosis not present

## 2023-02-19 DIAGNOSIS — Z79899 Other long term (current) drug therapy: Secondary | ICD-10-CM | POA: Insufficient documentation

## 2023-02-19 LAB — CBC WITH DIFFERENTIAL (CANCER CENTER ONLY)
Abs Immature Granulocytes: 0 10*3/uL (ref 0.00–0.07)
Basophils Absolute: 0 10*3/uL (ref 0.0–0.1)
Basophils Relative: 1 %
Eosinophils Absolute: 0.1 10*3/uL (ref 0.0–0.5)
Eosinophils Relative: 2 %
HCT: 37.5 % (ref 36.0–46.0)
Hemoglobin: 12.4 g/dL (ref 12.0–15.0)
Immature Granulocytes: 0 %
Lymphocytes Relative: 24 %
Lymphs Abs: 0.8 10*3/uL (ref 0.7–4.0)
MCH: 30.9 pg (ref 26.0–34.0)
MCHC: 33.1 g/dL (ref 30.0–36.0)
MCV: 93.5 fL (ref 80.0–100.0)
Monocytes Absolute: 0.4 10*3/uL (ref 0.1–1.0)
Monocytes Relative: 13 %
Neutro Abs: 2 10*3/uL (ref 1.7–7.7)
Neutrophils Relative %: 60 %
Platelet Count: 313 10*3/uL (ref 150–400)
RBC: 4.01 MIL/uL (ref 3.87–5.11)
RDW: 14.7 % (ref 11.5–15.5)
WBC Count: 3.4 10*3/uL — ABNORMAL LOW (ref 4.0–10.5)
nRBC: 0 % (ref 0.0–0.2)

## 2023-02-19 LAB — CMP (CANCER CENTER ONLY)
ALT: 14 U/L (ref 0–44)
AST: 25 U/L (ref 15–41)
Albumin: 3.8 g/dL (ref 3.5–5.0)
Alkaline Phosphatase: 107 U/L (ref 38–126)
Anion gap: 5 (ref 5–15)
BUN: 13 mg/dL (ref 6–20)
CO2: 30 mmol/L (ref 22–32)
Calcium: 9.7 mg/dL (ref 8.9–10.3)
Chloride: 107 mmol/L (ref 98–111)
Creatinine: 0.98 mg/dL (ref 0.44–1.00)
GFR, Estimated: >60 mL/min (ref >60)
Glucose, Bld: 86 mg/dL (ref 70–99)
Potassium: 4.1 mmol/L (ref 3.5–5.1)
Sodium: 142 mmol/L (ref 135–145)
Total Bilirubin: 0.4 mg/dL (ref 0.3–1.2)
Total Protein: 6.9 g/dL (ref 6.5–8.1)

## 2023-02-19 MED ORDER — SODIUM CHLORIDE 0.9 % IV SOLN
10.0000 mg | Freq: Once | INTRAVENOUS | Status: AC
  Administered 2023-02-19: 10 mg via INTRAVENOUS
  Filled 2023-02-19: qty 10

## 2023-02-19 MED ORDER — FAM-TRASTUZUMAB DERUXTECAN-NXKI CHEMO 100 MG IV SOLR
5.4000 mg/kg | Freq: Once | INTRAVENOUS | Status: AC
  Administered 2023-02-19: 340 mg via INTRAVENOUS
  Filled 2023-02-19: qty 17

## 2023-02-19 MED ORDER — DIPHENHYDRAMINE HCL 25 MG PO CAPS
50.0000 mg | ORAL_CAPSULE | Freq: Once | ORAL | Status: AC
  Administered 2023-02-19: 50 mg via ORAL
  Filled 2023-02-19: qty 2

## 2023-02-19 MED ORDER — DEXTROSE 5 % IV SOLN: Freq: Once | INTRAVENOUS | Status: AC

## 2023-02-19 MED ORDER — ACETAMINOPHEN 325 MG PO TABS
650.0000 mg | ORAL_TABLET | Freq: Once | ORAL | Status: AC
  Administered 2023-02-19: 650 mg via ORAL
  Filled 2023-02-19: qty 2

## 2023-02-19 MED ORDER — PALONOSETRON HCL INJECTION 0.25 MG/5ML
0.2500 mg | Freq: Once | INTRAVENOUS | Status: AC
  Administered 2023-02-19: 0.25 mg via INTRAVENOUS
  Filled 2023-02-19: qty 5

## 2023-02-19 NOTE — Assessment & Plan Note (Signed)
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   Treatment summary: 1. Right mastectomy 08/29/2015 at [facility] (Dr.Singh): IDC with invol of skin and skin ulceration, breast capsule inv cancer, superior medial margin positive, 1/1 LN positive, grade 3, 14.3 cm, 3.5 cm, ALI, chest wall involv, ER 90-100%, PR 80-90%, HER-2 negative, Ki 67 40-50% T4CN1 (St 3B) 2. Patient started tamoxifen but developed severe headache with severe hypertension and tamoxifen was discontinued 03/17/2016.  3. 02/2017 started on Exemestane, Zoladex started 05/29/2017 4.  Hospitalization for paraplegia: T9 vertebral body compression status post decompressive laminectomy and tumor resection 01/04/2020 5.  Palliative radiation to the thoracic spine 01/28/2020 6.  Ibrance with letrozole started April 2021 discontinued 07/24/2021 7.  Enhertu started 10/17/2021 every 3 weeks  ----------------------------------------------------------------------------------------------------------------- 05/11/2021: CT angiogram: Metastatic breast cancer soft tissue breast masses greater on the right, axillary supraclavicular lymphadenopathy, multiple bone metastases    Hospitalization 08/25/2021-09/05/2021: Worsening right chest wall pain and infection (patient has appointments with palliative care as well as infectious disease)   Current treatment: Enhertu started 10/17/2021, stopped by the patient September 2023, resumed 11/06/2022, today cycle 20 Toxicities: Tolerating it extremely well without any major problems.  CT chest 12/30/2022: Overall progression of disease increasing soft tissue components along with several lytic bone lesions, increase in size of liver metastases  and 2 mediastinal lymph nodes slightly smaller   Discussion: I discussed switching treatment to [place] but patient wants to continue with the Enhertu and she plans to do an intensive detox regimen which she believes will be able to make a difference.   CT chest planned for 03/06/2023

## 2023-02-19 NOTE — Patient Instructions (Signed)
[facility]  Discharge Instructions: Thank you for choosing [facility] to provide your oncology and hematology care.   If you have a lab appointment with the Cancer Center, please go directly to the Cancer Center and check in at the registration area.   Wear comfortable clothing and clothing appropriate for easy access to any Portacath or PICC line.   We strive to give you quality time with your provider. You may need to reschedule your appointment if you arrive late (15 or more minutes).  Arriving late affects you and other patients whose appointments are after yours.  Also, if you miss three or more appointments without notifying the office, you may be dismissed from the clinic at the provider's discretion.      For prescription refill requests, have your pharmacy contact our office and allow 72 hours for refills to be completed.    Today you received the following chemotherapy and/or immunotherapy agents: fam-trastuzumab deruxtecan       To help prevent nausea and vomiting after your treatment, we encourage you to take your nausea medication as directed.  BELOW ARE SYMPTOMS THAT SHOULD BE REPORTED IMMEDIATELY: *FEVER GREATER THAN 100.4 F (38 C) OR HIGHER *CHILLS OR SWEATING *NAUSEA AND VOMITING THAT IS NOT CONTROLLED WITH YOUR NAUSEA MEDICATION *UNUSUAL SHORTNESS OF BREATH *UNUSUAL BRUISING OR BLEEDING *URINARY PROBLEMS (pain or burning when urinating, or frequent urination) *BOWEL PROBLEMS (unusual diarrhea, constipation, pain near the anus) TENDERNESS IN MOUTH AND THROAT WITH OR WITHOUT PRESENCE OF ULCERS (sore throat, sores in mouth, or a toothache) UNUSUAL RASH, SWELLING OR PAIN  UNUSUAL VAGINAL DISCHARGE OR ITCHING   Items with * indicate a potential emergency and should be followed up as soon as possible or go to the Emergency Department if any problems should occur.  Please show the CHEMOTHERAPY ALERT CARD or IMMUNOTHERAPY  ALERT CARD at check-in to the Emergency Department and triage nurse.  Should you have questions after your visit or need to cancel or reschedule your appointment, please contact [facility]  Dept: [phone]  and follow the prompts.  Office hours are 8:00 a.m. to 4:30 p.m. Monday - Friday. Please note that voicemails left after 4:00 p.m. may not be returned until the following business day.  We are closed weekends and major holidays. You have access to a nurse at all times for urgent questions. Please call the main number to the clinic Dept: [phone] and follow the prompts.   For any non-urgent questions, you may also contact your provider using MyChart. We now offer e-Visits for anyone 18 and older to request care online for non-urgent symptoms. For details visit [email or web address].   Also download the MyChart app! Go to the app store, search "MyChart", open the app, select [facility], and log in with your MyChart username and password.   

## 2023-02-19 NOTE — Progress Notes (Signed)
RN successfully faxed completed medical expense verification for housing to [phone] as well as provided pt a copy.

## 2023-02-20 ENCOUNTER — Encounter: Payer: Self-pay | Admitting: Hematology and Oncology

## 2023-02-20 ENCOUNTER — Encounter: Payer: Self-pay | Admitting: Nurse Practitioner

## 2023-03-06 ENCOUNTER — Ambulatory Visit (HOSPITAL_COMMUNITY)
Admission: RE | Admit: 2023-03-06 | Discharge: 2023-03-06 | Disposition: A | Discharge status: Home / Self Care | Payer: Medicare PPO | Source: Ambulatory Visit | Attending: Hematology and Oncology | Admitting: Hematology and Oncology

## 2023-03-06 ENCOUNTER — Encounter (HOSPITAL_COMMUNITY): Payer: Self-pay

## 2023-03-06 DIAGNOSIS — Z170 Estrogen receptor positive status [ER+]: Secondary | ICD-10-CM | POA: Insufficient documentation

## 2023-03-06 DIAGNOSIS — C50411 Malignant neoplasm of upper-outer quadrant of right female breast: Secondary | ICD-10-CM | POA: Insufficient documentation

## 2023-03-06 DIAGNOSIS — C7949 Secondary malignant neoplasm of other parts of nervous system: Secondary | ICD-10-CM | POA: Insufficient documentation

## 2023-03-06 DIAGNOSIS — C50919 Malignant neoplasm of unspecified site of unspecified female breast: Secondary | ICD-10-CM | POA: Diagnosis present

## 2023-03-06 MED ORDER — SODIUM CHLORIDE (PF) 0.9 % IJ SOLN
INTRAMUSCULAR | Status: AC
  Filled 2023-03-06: qty 50

## 2023-03-06 MED ORDER — IOHEXOL 300 MG/ML  SOLN
75.0000 mL | Freq: Once | INTRAMUSCULAR | Status: AC | PRN
  Administered 2023-03-06: 75 mL via INTRAVENOUS

## 2023-03-09 NOTE — Progress Notes (Signed)
Patient Care Team: Misty Lose, MD as PCP - General (Hematology and Oncology)  DIAGNOSIS: No diagnosis found.  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 But she could not afford these trips.   01/08/2014 Breast MRI   right breast multiple enhancing masses within the soft tissues largest 5.6 cm in wall skin surface and the capsule of the silicone prosthesis, contiguous nodules involving in inferomedial breast and tired and subcentimeter nodules across the midline   11/02/2014 Cancer Staging   Staging form: Breast, AJCC 7th Edition - Clinical: Stage IV (T4b, N0, M1) - Signed by Misty Lose, MD on 12/18/2022   08/29/2015 Surgery   Right mastectomy: IDC with invol of skin and skin ulceration, breast capsule inv cancer, superior medial margin positive, 1/1 LN positive, grade 3, 14.3 cm, 3.5 cm, ALI, chest wall involv, ER 90-100%, PR 80-90%, HER-2 negative, Ki 67 40-50% T4CN1 (St 3B)   03/05/2016 -  Anti-estrogen oral therapy   Tamoxifen 20 mg daily  stopped due to headache and uncontrolled hypertension. Decrease to 10 mg daily 04/03/2016, stopped June 2017 and took an estrogen metabolizer over-the-counter; started Aromasin April 2018 from a [place] physician, Zoladex started on 05/2017 and switched to Letrozole in 09/2017   01/04/2020 Surgery   Patient presented to the ED on 01/04/20 for worsening lower extremity numbness and underwent a decompressive laminectomy with tumor resection at T9 that was complicated with acute blood loss anemia and leukocytosis.   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[place])  01/04/2020 Initial Diagnosis   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[place])   10/17/2021 - 06/06/2022 Chemotherapy   Patient is on Treatment Plan : BREAST METASTATIC fam-trastuzumab deruxtecan-nxki (Enhertu) q21d     10/17/2021 -  Chemotherapy   Patient is on Treatment Plan : BREAST METASTATIC Fam-Trastuzumab Deruxtecan-nxki (Enhertu) (5.4) q21d     Primary malignant neoplasm of breast with metastasis ([place])  01/08/2020 Initial Diagnosis   Metastatic breast cancer ([place])   10/17/2021 - 06/06/2022 Chemotherapy   Patient is on Treatment Plan : BREAST METASTATIC fam-trastuzumab deruxtecan-nxki (Enhertu) q21d  10/17/2021 -  Chemotherapy   Patient is on Treatment Plan : BREAST METASTATIC Fam-Trastuzumab Deruxtecan-nxki (Enhertu) (5.4) q21d       CHIEF COMPLIANT:   INTERVAL HISTORY: Misty Arnold is  a   ALLERGIES:  is allergic to amoxicillin, penicillins, pork-derived products, penicillin g, and tamoxifen.  MEDICATIONS:  Current Outpatient Medications  Medication Sig Dispense Refill   Ascorbic Acid (VITAMIN C PO) Take 1,000 mg by mouth daily. (Patient not taking: Reported on 01/29/2023)     atenolol (TENORMIN) 50 MG tablet Take 1 tablet (50 mg total) by mouth 2 (two) times daily. 180 tablet 3   celecoxib (CELEBREX) 200 MG capsule Take 1 capsule (200 mg total) by mouth at bedtime. 30 capsule 3   Cholecalciferol (VITAMIN D3) 125 MCG (5000 UT) CAPS Take 1 capsule (5,000 Units total) by mouth daily. 30 capsule 3   lip balm (CARMEX) ointment Apply 1 application topically as needed for lip care (cold sores). 7 g 0   metroNIDAZOLE (METROGEL) 0.75 % gel Apply topically 2 (two) times daily. 45 g 1   Multiple Vitamins-Minerals (MULTIVITAMIN WITH MINERALS) tablet FLORADIX Supplement-Iron +minerals     Nutritional Supplements (,FEEDING SUPPLEMENT, PROSOURCE PLUS) liquid Take 30 mLs by mouth 4 (four) times daily - after meals and at bedtime.     ondansetron (ZOFRAN) 8 MG tablet Take by mouth.     polyethylene glycol (MIRALAX / GLYCOLAX) 17 g packet Take 17 g by mouth daily. 30 each 0   potassium chloride SA (KLOR-CON M) 20 MEQ tablet Take 1 tablet by mouth daily. 30 tablet 3   Silver-Carboxymethylcellulose (AQUACEL AG ADVANTAGE) 4"X5" PADS Apply 1 Pad topically in the morning and at bedtime. 60 each 3   No current facility-administered medications for this visit.    PHYSICAL EXAMINATION: ECOG PERFORMANCE STATUS: {CHL ONC ECOG PS:[id number]}  There were no vitals filed for this visit. There were no vitals filed for this visit.  BREAST:*** No palpable masses or nodules in either right or left breasts. No palpable axillary supraclavicular or infraclavicular adenopathy no breast tenderness or nipple discharge. (exam performed in the presence of a chaperone)  LABORATORY DATA:  I have reviewed the  data as listed    Latest Ref Rng & Units 02/19/2023    1:06 PM 01/29/2023   12:14 PM 01/08/2023    1:48 PM  CMP  Glucose 70 - 99 mg/dL 86  409  811   BUN 6 - 20 mg/dL 13  13  14    Creatinine 0.44 - 1.00 mg/dL 9.14  7.82  9.56   Sodium 135 - 145 mmol/L 142  145  143   Potassium 3.5 - 5.1 mmol/L 4.1  3.5  3.3   Chloride 98 - 111 mmol/L 107  109  105   CO2 22 - 32 mmol/L 30  27  30    Calcium 8.9 - 10.3 mg/dL 9.7  21.3  9.5   Total Protein 6.5 - 8.1 g/dL 6.9  7.3  7.4   Total Bilirubin 0.3 - 1.2 mg/dL 0.4  0.4  0.7   Alkaline Phos 38 - 126 U/L 107  124  125   AST 15 - 41 U/L 25  25  21    ALT 0 - 44 U/L 14  14  14      Lab Results  Component Value Date   WBC 3.4 (L) 02/19/2023   HGB 12.4 02/19/2023   HCT 37.5 02/19/2023   MCV 93.5 02/19/2023   PLT 313 02/19/2023  NEUTROABS 2.0 02/19/2023    ASSESSMENT & PLAN:  No problem-specific Assessment & Plan notes found for this encounter.    No orders of the defined types were placed in this encounter.  The patient has a good understanding of the overall plan. she agrees with it. she will call with any problems that may develop before the next visit here. Total time spent: 30 mins including face to face time and time spent for planning, charting and co-ordination of care   Sherlyn Lick, CMA 03/09/23    I Janan Ridge am acting as a Neurosurgeon for [employer]  ***

## 2023-03-11 MED FILL — Dexamethasone Sodium Phosphate Inj 100 MG/10ML: INTRAMUSCULAR | Qty: 1 | Status: AC

## 2023-03-12 ENCOUNTER — Encounter: Payer: Self-pay | Admitting: Nurse Practitioner

## 2023-03-12 ENCOUNTER — Other Ambulatory Visit: Payer: Self-pay

## 2023-03-12 ENCOUNTER — Inpatient Hospital Stay: Payer: Medicare PPO | Attending: Hematology and Oncology

## 2023-03-12 ENCOUNTER — Inpatient Hospital Stay (HOSPITAL_BASED_OUTPATIENT_CLINIC_OR_DEPARTMENT_OTHER): Payer: Medicare PPO | Admitting: Nurse Practitioner

## 2023-03-12 ENCOUNTER — Inpatient Hospital Stay (HOSPITAL_BASED_OUTPATIENT_CLINIC_OR_DEPARTMENT_OTHER): Payer: Medicare PPO | Admitting: Hematology and Oncology

## 2023-03-12 ENCOUNTER — Other Ambulatory Visit (HOSPITAL_COMMUNITY): Payer: Self-pay

## 2023-03-12 ENCOUNTER — Inpatient Hospital Stay: Payer: Medicare PPO

## 2023-03-12 VITALS — BP 147/98 | HR 88 | Resp 16

## 2023-03-12 VITALS — BP 154/86 | HR 77 | Temp 98.1°F | Resp 18 | Ht 65.0 in | Wt 129.2 lb

## 2023-03-12 DIAGNOSIS — Z170 Estrogen receptor positive status [ER+]: Secondary | ICD-10-CM | POA: Insufficient documentation

## 2023-03-12 DIAGNOSIS — C7949 Secondary malignant neoplasm of other parts of nervous system: Secondary | ICD-10-CM

## 2023-03-12 DIAGNOSIS — C50919 Malignant neoplasm of unspecified site of unspecified female breast: Secondary | ICD-10-CM

## 2023-03-12 DIAGNOSIS — C50411 Malignant neoplasm of upper-outer quadrant of right female breast: Secondary | ICD-10-CM | POA: Insufficient documentation

## 2023-03-12 DIAGNOSIS — R530 Neoplastic (malignant) related fatigue: Secondary | ICD-10-CM | POA: Diagnosis not present

## 2023-03-12 DIAGNOSIS — Z9011 Acquired absence of right breast and nipple: Secondary | ICD-10-CM | POA: Diagnosis not present

## 2023-03-12 DIAGNOSIS — C7951 Secondary malignant neoplasm of bone: Secondary | ICD-10-CM | POA: Insufficient documentation

## 2023-03-12 DIAGNOSIS — Z79899 Other long term (current) drug therapy: Secondary | ICD-10-CM | POA: Insufficient documentation

## 2023-03-12 DIAGNOSIS — Z5111 Encounter for antineoplastic chemotherapy: Secondary | ICD-10-CM | POA: Insufficient documentation

## 2023-03-12 DIAGNOSIS — G8220 Paraplegia, unspecified: Secondary | ICD-10-CM | POA: Insufficient documentation

## 2023-03-12 DIAGNOSIS — Z515 Encounter for palliative care: Secondary | ICD-10-CM | POA: Diagnosis not present

## 2023-03-12 DIAGNOSIS — Z923 Personal history of irradiation: Secondary | ICD-10-CM | POA: Diagnosis not present

## 2023-03-12 LAB — CBC WITH DIFFERENTIAL (CANCER CENTER ONLY)
Abs Immature Granulocytes: 0.01 10*3/uL (ref 0.00–0.07)
Basophils Absolute: 0 10*3/uL (ref 0.0–0.1)
Basophils Relative: 1 %
Eosinophils Absolute: 0 10*3/uL (ref 0.0–0.5)
Eosinophils Relative: 1 %
HCT: 36.1 % (ref 36.0–46.0)
Hemoglobin: 12.4 g/dL (ref 12.0–15.0)
Immature Granulocytes: 0 %
Lymphocytes Relative: 22 %
Lymphs Abs: 0.8 10*3/uL (ref 0.7–4.0)
MCH: 32.4 pg (ref 26.0–34.0)
MCHC: 34.3 g/dL (ref 30.0–36.0)
MCV: 94.3 fL (ref 80.0–100.0)
Monocytes Absolute: 0.3 10*3/uL (ref 0.1–1.0)
Monocytes Relative: 9 %
Neutro Abs: 2.3 10*3/uL (ref 1.7–7.7)
Neutrophils Relative %: 67 %
Platelet Count: 339 10*3/uL (ref 150–400)
RBC: 3.83 MIL/uL — ABNORMAL LOW (ref 3.87–5.11)
RDW: 13.9 % (ref 11.5–15.5)
WBC Count: 3.5 10*3/uL — ABNORMAL LOW (ref 4.0–10.5)
nRBC: 0 % (ref 0.0–0.2)

## 2023-03-12 LAB — CMP (CANCER CENTER ONLY)
ALT: 9 U/L (ref 0–44)
AST: 26 U/L (ref 15–41)
Albumin: 4 g/dL (ref 3.5–5.0)
Alkaline Phosphatase: 102 U/L (ref 38–126)
Anion gap: 7 (ref 5–15)
BUN: 10 mg/dL (ref 6–20)
CO2: 26 mmol/L (ref 22–32)
Calcium: 9.5 mg/dL (ref 8.9–10.3)
Chloride: 109 mmol/L (ref 98–111)
Creatinine: 0.93 mg/dL (ref 0.44–1.00)
GFR, Estimated: >60 mL/min (ref >60)
Glucose, Bld: 74 mg/dL (ref 70–99)
Potassium: 3.8 mmol/L (ref 3.5–5.1)
Sodium: 142 mmol/L (ref 135–145)
Total Bilirubin: 0.3 mg/dL (ref 0.3–1.2)
Total Protein: 7 g/dL (ref 6.5–8.1)

## 2023-03-12 MED ORDER — DEXTROSE 5 % IV SOLN: Freq: Once | INTRAVENOUS | Status: AC

## 2023-03-12 MED ORDER — FAM-TRASTUZUMAB DERUXTECAN-NXKI CHEMO 100 MG IV SOLR
5.4000 mg/kg | Freq: Once | INTRAVENOUS | Status: DC
  Administered 2023-03-12: 340 mg via INTRAVENOUS
  Filled 2023-03-12: qty 17

## 2023-03-12 MED ORDER — DIPHENHYDRAMINE HCL 25 MG PO CAPS
50.0000 mg | ORAL_CAPSULE | Freq: Once | ORAL | Status: AC
  Administered 2023-03-12: 50 mg via ORAL
  Filled 2023-03-12: qty 2

## 2023-03-12 MED ORDER — CELECOXIB 200 MG PO CAPS
200.0000 mg | ORAL_CAPSULE | Freq: Every day | ORAL | 6 refills | Status: DC
  Filled 2023-03-12 – 2023-04-02 (×2): qty 30, 30d supply, fill #0
  Filled 2023-05-24: qty 30, 30d supply, fill #1

## 2023-03-12 MED ORDER — SODIUM CHLORIDE 0.9 % IV SOLN
10.0000 mg | Freq: Once | INTRAVENOUS | Status: AC
  Administered 2023-03-12: 10 mg via INTRAVENOUS
  Filled 2023-03-12: qty 10

## 2023-03-12 MED ORDER — PALONOSETRON HCL INJECTION 0.25 MG/5ML
0.2500 mg | Freq: Once | INTRAVENOUS | Status: AC
  Administered 2023-03-12: 0.25 mg via INTRAVENOUS
  Filled 2023-03-12: qty 5

## 2023-03-12 MED ORDER — METRONIDAZOLE 0.75 % EX GEL
Freq: Two times a day (BID) | CUTANEOUS | 6 refills | Status: DC
  Filled 2023-03-12 – 2023-04-02 (×2): qty 45, 30d supply, fill #0

## 2023-03-12 MED ORDER — ACETAMINOPHEN 325 MG PO TABS
650.0000 mg | ORAL_TABLET | Freq: Once | ORAL | Status: AC
  Administered 2023-03-12: 650 mg via ORAL
  Filled 2023-03-12: qty 2

## 2023-03-12 NOTE — Addendum Note (Signed)
Addended by: Mickeal Needy on: 03/12/2023 04:48 PM   Modules accepted: Orders

## 2023-03-12 NOTE — Patient Instructions (Signed)
[facility]  Discharge Instructions: Thank you for choosing [facility] to provide your oncology and hematology care.   If you have a lab appointment with the Cancer Center, please go directly to the Cancer Center and check in at the registration area.   Wear comfortable clothing and clothing appropriate for easy access to any Portacath or PICC line.   We strive to give you quality time with your provider. You may need to reschedule your appointment if you arrive late (15 or more minutes).  Arriving late affects you and other patients whose appointments are after yours.  Also, if you miss three or more appointments without notifying the office, you may be dismissed from the clinic at the provider's discretion.      For prescription refill requests, have your pharmacy contact our office and allow 72 hours for refills to be completed.    Today you received the following chemotherapy and/or immunotherapy agents: fam-trastuzumab deruxtecan       To help prevent nausea and vomiting after your treatment, we encourage you to take your nausea medication as directed.  BELOW ARE SYMPTOMS THAT SHOULD BE REPORTED IMMEDIATELY: *FEVER GREATER THAN 100.4 F (38 C) OR HIGHER *CHILLS OR SWEATING *NAUSEA AND VOMITING THAT IS NOT CONTROLLED WITH YOUR NAUSEA MEDICATION *UNUSUAL SHORTNESS OF BREATH *UNUSUAL BRUISING OR BLEEDING *URINARY PROBLEMS (pain or burning when urinating, or frequent urination) *BOWEL PROBLEMS (unusual diarrhea, constipation, pain near the anus) TENDERNESS IN MOUTH AND THROAT WITH OR WITHOUT PRESENCE OF ULCERS (sore throat, sores in mouth, or a toothache) UNUSUAL RASH, SWELLING OR PAIN  UNUSUAL VAGINAL DISCHARGE OR ITCHING   Items with * indicate a potential emergency and should be followed up as soon as possible or go to the Emergency Department if any problems should occur.  Please show the CHEMOTHERAPY ALERT CARD or IMMUNOTHERAPY  ALERT CARD at check-in to the Emergency Department and triage nurse.  Should you have questions after your visit or need to cancel or reschedule your appointment, please contact [facility]  Dept: [phone]  and follow the prompts.  Office hours are 8:00 a.m. to 4:30 p.m. Monday - Friday. Please note that voicemails left after 4:00 p.m. may not be returned until the following business day.  We are closed weekends and major holidays. You have access to a nurse at all times for urgent questions. Please call the main number to the clinic Dept: [phone] and follow the prompts.   For any non-urgent questions, you may also contact your provider using MyChart. We now offer e-Visits for anyone 18 and older to request care online for non-urgent symptoms. For details visit [email or web address].   Also download the MyChart app! Go to the app store, search "MyChart", open the app, select [facility], and log in with your MyChart username and password.   

## 2023-03-12 NOTE — Progress Notes (Signed)
Palliative Medicine [facility]  Telephone:([phone] Fax:(336) [phone]   Name: Misty Arnold Date: 03/12/2023 MRN: [id number]  DOB: Feb 04, 1971  Patient Care Team: Serena Croissant, MD as PCP - General (Hematology and Oncology)    INTERVAL HISTORY: Misty Arnold is a 52 y.o. female with right metastatic breast cancer s/p mastectomy, large right chest wound secondary to necrotic mass, weankess due to T9 vertebral compression s/p tumor resection and decompressive laminectomy, and hypertension. Patient did not receive treatment for some time after seeking care in [place]. Currently receiving Enhertu treatment. Palliative following for symptom management and goals of care.   SOCIAL HISTORY:    [name] reports that she has never smoked. She has never used smokeless tobacco. She reports that she does not currently use alcohol. She reports that she does not use drugs.  ADVANCE DIRECTIVES:  Advance directives completed during recent hospitalization 08/27/2021.  Documents are on file and have been reviewed and [name].  CODE STATUS: Full code  PAST MEDICAL HISTORY: Past Medical History:  Diagnosis Date   Abnormal uterine bleeding    Anemia    Breast cancer (HCC)    Depression    Fibroid    Hormone disorder    Hypertension    Infertility, female    ALLERGIES:  is allergic to amoxicillin, penicillins, pork-derived products, penicillin g, and tamoxifen.  MEDICATIONS:  Current Outpatient Medications  Medication Sig Dispense Refill   Ascorbic Acid (VITAMIN C PO) Take 1,000 mg by mouth daily. (Patient not taking: Reported on 01/29/2023)     atenolol (TENORMIN) 50 MG tablet Take 1 tablet (50 mg total) by mouth 2 (two) times daily. 180 tablet 3   celecoxib (CELEBREX) 200 MG capsule Take 1 capsule (200 mg total) by mouth at bedtime. 30 capsule 3   Cholecalciferol (VITAMIN D3) 125 MCG (5000 UT) CAPS Take 1 capsule (5,000 Units total) by mouth daily. 30 capsule 3   lip  balm (CARMEX) ointment Apply 1 application topically as needed for lip care (cold sores). 7 g 0   metroNIDAZOLE (METROGEL) 0.75 % gel Apply topically 2 (two) times daily. 45 g 1   Multiple Vitamins-Minerals (MULTIVITAMIN WITH MINERALS) tablet FLORADIX Supplement-Iron +minerals     Nutritional Supplements (,FEEDING SUPPLEMENT, PROSOURCE PLUS) liquid Take 30 mLs by mouth 4 (four) times daily - after meals and at bedtime.     ondansetron (ZOFRAN) 8 MG tablet Take by mouth.     polyethylene glycol (MIRALAX / GLYCOLAX) 17 g packet Take 17 g by mouth daily. 30 each 0   potassium chloride SA (KLOR-CON M) 20 MEQ tablet Take 1 tablet by mouth daily. 30 tablet 3   Silver-Carboxymethylcellulose (AQUACEL AG ADVANTAGE) 4"X5" PADS Apply 1 Pad topically in the morning and at bedtime. 60 each 3   No current facility-administered medications for this visit.    VITAL SIGNS: There were no vitals taken for this visit. There were no vitals filed for this visit.   Estimated body mass index is 22.45 kg/m as calculated from the following:   Height as of 02/19/23: 5\' 5"  (1.651 m).   Weight as of 02/19/23: 134 lb 14.4 oz (61.2 kg).  PERFORMANCE STATUS (ECOG) : 0 - Asymptomatic  Assessment NAD, resting in recliner  Normal breathing pattern AAO x4  IMPRESSION: I saw Ms. Milbourn during infusion. No family present. Continues to do well overall. Shares she is remaining as active as possible. Denies nausea, vomiting, constipation, or diarrhea.   No symptom needs at  this time. Continues to take things one day at a time. Ongoing fatigue but does feel this has improved since the weather has gotten warmer.  PLAN: Pain resolved. Patient denies use of Tylenol, Oxycodone, or other pain relievers.  Reports use of Diazepam about once weekly to help her sleep. Right breast wound healing. ABD pads provided. Palliative will plan to see her back in 6-8 weeks.  Patient knows to contact office sooner if needed.  Patient  expressed understanding and was in agreement with this plan. She also understands that she can call the clinic at any time with any questions, concerns, or complaints.   Visit consisted of counseling and education dealing with the complex and emotionally intense issues of symptom management and palliative care in the setting of serious and potentially life-threatening illness.Greater than 50%  of this time was spent counseling and coordinating care related to the above assessment and plan.    Willette Alma, AGPCNP-BC  Palliative Medicine Team/[facility]

## 2023-03-12 NOTE — Assessment & Plan Note (Addendum)
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   Treatment summary: 1. Right mastectomy 08/29/2015 at [facility] (Dr.Singh): IDC with invol of skin and skin ulceration, breast capsule inv cancer, superior medial margin positive, 1/1 LN positive, grade 3, 14.3 cm, 3.5 cm, ALI, chest wall involv, ER 90-100%, PR 80-90%, HER-2 negative, Ki 67 40-50% T4CN1 (St 3B) 2. Patient started tamoxifen but developed severe headache with severe hypertension and tamoxifen was discontinued 03/17/2016.  3. 02/2017 started on Exemestane, Zoladex started 05/29/2017 4.  Hospitalization for paraplegia: T9 vertebral body compression status post decompressive laminectomy and tumor resection 01/04/2020 5.  Palliative radiation to the thoracic spine 01/28/2020 6.  Ibrance with letrozole started April 2021 discontinued 07/24/2021 7.  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  The major toxicities could include hyponatremia, decreased appetite nausea anemia elevation of LFTs headaches and musculoskeletal pains.  Skin rash is also possibility.

## 2023-03-20 ENCOUNTER — Other Ambulatory Visit (HOSPITAL_COMMUNITY): Payer: Self-pay

## 2023-03-20 ENCOUNTER — Telehealth: Payer: Self-pay | Admitting: *Deleted

## 2023-03-20 NOTE — Telephone Encounter (Signed)
This RN returned call to pt per her VM requesting name and spelling of medication suggested by MD.  Per return call obtained identified VM- detailed message stating name of drug is Orserdu also named elacestrant and spelled out each name.  Also stated above name is in message per My Chart.

## 2023-03-28 NOTE — Progress Notes (Signed)
Patient Care Team: Serena Croissant, MD as PCP - General (Hematology and Oncology)  DIAGNOSIS: No diagnosis found.  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; patient went to cancer treatment centers of [place] for second opinion and delayed therapy   01/26/2011 Surgery   right mastectomy followed by reconstruction: Invasive ductal carcinoma T1 N1 MIC M0 stage IB ER/PR positive HER-2 negative, BRCA negative, Oncotype DX low risk   05/29/2012 Procedure   right chest wall nodule excision done on 06/24/2012 showed metastatic carcinoma margins were positive, but CT scan no metastatic disease, recommended chemotherapy but patient refused also refused reexcision   04/28/2013 Treatment Plan Change   patient went to [place] for alternative treatments and took herbal medications, tonics etc. But she could not afford these trips.   01/08/2014 Breast MRI   right breast multiple enhancing masses within the soft tissues largest 5.6 cm in wall skin surface and the capsule of the silicone prosthesis, contiguous nodules involving in inferomedial breast and tired and subcentimeter nodules across the midline   11/02/2014 Cancer Staging   Staging form: Breast, AJCC 7th Edition - Clinical: Stage IV (T4b, N0, M1) - Signed by Serena Croissant, MD on 12/18/2022   08/29/2015 Surgery   Right mastectomy: IDC with invol of skin and skin ulceration, breast capsule inv cancer, superior medial margin positive, 1/1 LN positive, grade 3, 14.3 cm, 3.5 cm, ALI, chest wall involv, ER 90-100%, PR 80-90%, HER-2 negative, Ki 67 40-50% T4CN1 (St 3B)   03/05/2016 -  Anti-estrogen oral therapy   Tamoxifen 20 mg daily  stopped due to headache and uncontrolled hypertension. Decrease to 10 mg daily 04/03/2016, stopped June 2017 and took an estrogen metabolizer over-the-counter; started Aromasin April 2018 from a [place] physician, Zoladex started on 05/2017 and switched to Letrozole in 09/2017   01/04/2020 Surgery   Patient presented to the ED on 01/04/20 for worsening lower extremity numbness and underwent a decompressive laminectomy with tumor resection at T9 that was complicated with acute blood loss anemia and leukocytosis.   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)  01/04/2020 Initial Diagnosis   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)   10/17/2021 - 06/06/2022 Chemotherapy   Patient is on Treatment Plan : BREAST METASTATIC fam-trastuzumab deruxtecan-nxki (Enhertu) q21d     10/17/2021 -  Chemotherapy   Patient is on Treatment Plan : BREAST METASTATIC Fam-Trastuzumab Deruxtecan-nxki (Enhertu) (5.4) q21d     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)  01/08/2020 Initial Diagnosis   Metastatic breast cancer (HCC)   10/17/2021 - 06/06/2022 Chemotherapy   Patient is on Treatment Plan : BREAST METASTATIC fam-trastuzumab deruxtecan-nxki (Enhertu) q21d  10/17/2021 -  Chemotherapy   Patient is on Treatment Plan : BREAST METASTATIC Fam-Trastuzumab Deruxtecan-nxki (Enhertu) (5.4) q21d       CHIEF COMPLIANT:  Follow up of metastatic breast cancer on  Xeloda/Scans     INTERVAL HISTORY: Misty Arnold is a 52 y.o. with above-mentioned history of metastatic breast cancer was on chemotherapy with Enhertu. She presents to the clinic for a follow-up.    ALLERGIES:  is allergic to amoxicillin, penicillins, pork-derived products, penicillin g, and tamoxifen.  MEDICATIONS:  Current Outpatient Medications  Medication Sig Dispense Refill   Ascorbic Acid (VITAMIN C PO) Take 1,000 mg by mouth daily. (Patient not taking: Reported on 01/29/2023)     atenolol (TENORMIN) 50 MG tablet Take 1 tablet (50 mg total) by mouth 2 (two) times daily. 180 tablet 3   celecoxib (CELEBREX) 200 MG capsule Take 1 capsule (200 mg total) by mouth at bedtime. 30 capsule 6   Cholecalciferol (VITAMIN D3) 125 MCG (5000 UT) CAPS Take 1 capsule (5,000 Units total) by mouth daily. 30 capsule 3   lip balm (CARMEX) ointment Apply 1 application topically as needed for lip care (cold sores). 7 g 0   metroNIDAZOLE (METROGEL) 0.75 % gel Apply topically 2 (two) times daily. 45 g 6   Multiple Vitamins-Minerals (MULTIVITAMIN WITH MINERALS) tablet FLORADIX Supplement-Iron +minerals     Nutritional Supplements (,FEEDING SUPPLEMENT, PROSOURCE PLUS) liquid Take 30 mLs by mouth 4 (four) times daily - after meals and at bedtime.     ondansetron (ZOFRAN) 8 MG tablet Take by mouth.     polyethylene glycol (MIRALAX / GLYCOLAX) 17 g packet Take 17 g by mouth daily. 30 each 0   potassium chloride SA (KLOR-CON M) 20 MEQ tablet Take 1 tablet by mouth daily. 30 tablet 3   Silver-Carboxymethylcellulose (AQUACEL AG ADVANTAGE) 4"X5" PADS Apply 1 Pad topically in the morning and at bedtime. 60 each 3   No current facility-administered medications for this visit.    PHYSICAL EXAMINATION: ECOG PERFORMANCE STATUS: {CHL ONC ECOG PS:[phone]}  There were no vitals filed for this visit. There were no vitals filed for this visit.  BREAST:*** No palpable masses or nodules in either right or left  breasts. No palpable axillary supraclavicular or infraclavicular adenopathy no breast tenderness or nipple discharge. (exam performed in the presence of a chaperone)  LABORATORY DATA:  I have reviewed the data as listed    Latest Ref Rng & Units 03/12/2023    1:01 PM 02/19/2023    1:06 PM 01/29/2023   12:14 PM  CMP  Glucose 70 - 99 mg/dL 74  86  161   BUN 6 - 20 mg/dL 10  13  13    Creatinine 0.44 - 1.00 mg/dL 0.96  0.45  4.09   Sodium 135 - 145 mmol/L 142  142  145   Potassium 3.5 - 5.1 mmol/L 3.8  4.1  3.5   Chloride 98 - 111 mmol/L 109  107  109   CO2 22 - 32 mmol/L 26  30  27    Calcium 8.9 - 10.3 mg/dL 9.5  9.7  81.1   Total Protein 6.5 - 8.1 g/dL 7.0  6.9  7.3   Total Bilirubin 0.3 - 1.2 mg/dL 0.3  0.4  0.4   Alkaline Phos 38 - 126 U/L 102  107  124   AST 15 - 41 U/L 26  25  25    ALT 0 - 44 U/L 9  14  14  Lab Results  Component Value Date   WBC 3.5 (L) 03/12/2023   HGB 12.4 03/12/2023   HCT 36.1 03/12/2023   MCV 94.3 03/12/2023   PLT 339 03/12/2023   NEUTROABS 2.3 03/12/2023    ASSESSMENT & PLAN:  No problem-specific Assessment & Plan notes found for this encounter.    No orders of the defined types were placed in this encounter.  The patient has a good understanding of the overall plan. she agrees with it. she will call with any problems that may develop before the next visit here. Total time spent: 30 mins including face to face time and time spent for planning, charting and co-ordination of care   Sherlyn Lick, CMA 03/28/23    I Janan Ridge am acting as a Neurosurgeon for [employer]  ***

## 2023-03-29 MED FILL — Dexamethasone Sodium Phosphate Inj 100 MG/10ML: INTRAMUSCULAR | Qty: 1 | Status: AC

## 2023-04-01 NOTE — Progress Notes (Unsigned)
Palliative Medicine [facility]  Telephone:(336) [phone] Fax:(336) [phone]   Name: Misty Arnold Date: 04/01/2023 MRN: [id number]  DOB: 1970/11/17  Patient Care Team: Misty Croissant, MD as PCP - General (Hematology and Oncology)    INTERVAL HISTORY: Misty Arnold is a 52 y.o. female with right metastatic breast cancer s/p mastectomy, large right chest wound secondary to necrotic mass, weankess due to T9 vertebral compression s/p tumor resection and decompressive laminectomy, and hypertension. Patient did not receive treatment for some time after seeking care in [place]. Currently receiving Enhertu treatment. Palliative following for symptom management and goals of care.   SOCIAL HISTORY:    Misty Arnold reports that she has never smoked. She has never used smokeless tobacco. She reports that she does not currently use alcohol. She reports that she does not use drugs.  ADVANCE DIRECTIVES:  Advance directives completed during recent hospitalization 08/27/2021.  Documents are on file and have been reviewed and [name].  CODE STATUS: Full code  PAST MEDICAL HISTORY: Past Medical History:  Diagnosis Date   Abnormal uterine bleeding    Anemia    Breast cancer (HCC)    Depression    Fibroid    Hormone disorder    Hypertension    Infertility, female    ALLERGIES:  is allergic to amoxicillin, penicillins, pork-derived products, penicillin g, and tamoxifen.  MEDICATIONS:  Current Outpatient Medications  Medication Sig Dispense Refill   Ascorbic Acid (VITAMIN C PO) Take 1,000 mg by mouth daily. (Patient not taking: Reported on 01/29/2023)     atenolol (TENORMIN) 50 MG tablet Take 1 tablet (50 mg total) by mouth 2 (two) times daily. 180 tablet 3   celecoxib (CELEBREX) 200 MG capsule Take 1 capsule (200 mg total) by mouth at bedtime. 30 capsule 6   Cholecalciferol (VITAMIN D3) 125 MCG (5000 UT) CAPS Take 1 capsule (5,000 Units total) by mouth daily. 30 capsule 3   lip  balm (CARMEX) ointment Apply 1 application topically as needed for lip care (cold sores). 7 g 0   metroNIDAZOLE (METROGEL) 0.75 % gel Apply topically 2 (two) times daily. 45 g 6   Multiple Vitamins-Minerals (MULTIVITAMIN WITH MINERALS) tablet FLORADIX Supplement-Iron +minerals     Nutritional Supplements (,FEEDING SUPPLEMENT, PROSOURCE PLUS) liquid Take 30 mLs by mouth 4 (four) times daily - after meals and at bedtime.     ondansetron (ZOFRAN) 8 MG tablet Take by mouth.     polyethylene glycol (MIRALAX / GLYCOLAX) 17 g packet Take 17 g by mouth daily. 30 each 0   potassium chloride SA (KLOR-CON M) 20 MEQ tablet Take 1 tablet by mouth daily. 30 tablet 3   Silver-Carboxymethylcellulose (AQUACEL AG ADVANTAGE) 4"X5" PADS Apply 1 Pad topically in the morning and at bedtime. 60 each 3   No current facility-administered medications for this visit.    VITAL SIGNS: There were no vitals taken for this visit. There were no vitals filed for this visit.   Estimated body mass index is 21.5 kg/m as calculated from the following:   Height as of 03/12/23: 5\' 5"  (1.651 m).   Weight as of 03/12/23: 129 lb 3.2 oz (58.6 kg).  PERFORMANCE STATUS (ECOG) : 0 - Asymptomatic  Assessment NAD, resting in recliner  Normal breathing pattern AAO x4  IMPRESSION: Misty Arnold presents to clinic today for support. She was seen by Dr. Pamelia Arnold today. Patient shares recent scan showed cancer progression. She is remaining hopeful for stability. Treatment plan is being adjusted per  Misty Arnold and patient is scheduled for brain MRI later today.   I created space and opportunity for Misty Arnold to share thoughts and feelings. She mentions difficulty with transportation as she is staying with different family members. Speaks to psychosocial needs and is agreeable to meet with Social Work.  Referral to be placed.   Weight is trending downward. Current weight 116lbs down from 129lbs on 5/7. Misty Arnold states she had stopped drinking Ensure  daily given diet had improved. She plans to restart.   Denies pain, nausea, vomiting, or constipation. Is dressing wound daily. Pending WOC and PT referral.   All questions answered and support provided. No symptom management needs at this time.   PLAN: Pain resolved. Patient denies use of Tylenol, Oxycodone, or other pain relievers.  Right breast wound. ABD pads provided. Referral placed to [facility] SW team for ongoing support and community resources.  Palliative will plan to see her back in 6-8 weeks.  Patient knows to contact office sooner if needed.  Patient expressed understanding and was in agreement with this plan. She also understands that she can call the clinic at any time with any questions, concerns, or complaints.   Visit consisted of counseling and education dealing with the complex and emotionally intense issues of symptom management and palliative care in the setting of serious and potentially life-threatening illness.Greater than 50%  of this time was spent counseling and coordinating care related to the above assessment and plan.    Misty Arnold, AGPCNP-BC  Palliative Medicine Team/[facility]

## 2023-04-02 ENCOUNTER — Inpatient Hospital Stay: Payer: Medicare PPO

## 2023-04-02 ENCOUNTER — Other Ambulatory Visit (HOSPITAL_COMMUNITY): Payer: Self-pay

## 2023-04-02 ENCOUNTER — Telehealth: Payer: Self-pay | Admitting: Pharmacy Technician

## 2023-04-02 ENCOUNTER — Other Ambulatory Visit: Payer: Self-pay

## 2023-04-02 ENCOUNTER — Ambulatory Visit
Admission: RE | Admit: 2023-04-02 | Discharge: 2023-04-02 | Disposition: A | Discharge status: Home / Self Care | Payer: Medicare PPO | Source: Ambulatory Visit | Attending: Hematology and Oncology | Admitting: Hematology and Oncology

## 2023-04-02 ENCOUNTER — Telehealth: Payer: Self-pay

## 2023-04-02 ENCOUNTER — Encounter: Payer: Self-pay | Admitting: Hematology and Oncology

## 2023-04-02 ENCOUNTER — Inpatient Hospital Stay (HOSPITAL_BASED_OUTPATIENT_CLINIC_OR_DEPARTMENT_OTHER): Payer: Medicare PPO | Admitting: Nurse Practitioner

## 2023-04-02 ENCOUNTER — Inpatient Hospital Stay (HOSPITAL_BASED_OUTPATIENT_CLINIC_OR_DEPARTMENT_OTHER): Payer: Medicare PPO | Admitting: Hematology and Oncology

## 2023-04-02 VITALS — BP 161/109 | HR 84 | Temp 98.5°F | Resp 16 | Wt 116.4 lb

## 2023-04-02 VITALS — BP 154/95 | HR 92 | Temp 98.5°F | Resp 18 | Ht 65.0 in | Wt 116.2 lb

## 2023-04-02 DIAGNOSIS — C7949 Secondary malignant neoplasm of other parts of nervous system: Secondary | ICD-10-CM

## 2023-04-02 DIAGNOSIS — Z170 Estrogen receptor positive status [ER+]: Secondary | ICD-10-CM

## 2023-04-02 DIAGNOSIS — C50919 Malignant neoplasm of unspecified site of unspecified female breast: Secondary | ICD-10-CM

## 2023-04-02 DIAGNOSIS — C50411 Malignant neoplasm of upper-outer quadrant of right female breast: Secondary | ICD-10-CM

## 2023-04-02 DIAGNOSIS — R630 Anorexia: Secondary | ICD-10-CM

## 2023-04-02 DIAGNOSIS — Z5111 Encounter for antineoplastic chemotherapy: Secondary | ICD-10-CM | POA: Diagnosis not present

## 2023-04-02 DIAGNOSIS — Z515 Encounter for palliative care: Secondary | ICD-10-CM | POA: Diagnosis not present

## 2023-04-02 DIAGNOSIS — Z7189 Other specified counseling: Secondary | ICD-10-CM | POA: Diagnosis not present

## 2023-04-02 DIAGNOSIS — R634 Abnormal weight loss: Secondary | ICD-10-CM

## 2023-04-02 LAB — CMP (CANCER CENTER ONLY)
ALT: 13 U/L (ref 0–44)
AST: 30 U/L (ref 15–41)
Albumin: 4.2 g/dL (ref 3.5–5.0)
Alkaline Phosphatase: 102 U/L (ref 38–126)
Anion gap: 9 (ref 5–15)
BUN: 9 mg/dL (ref 6–20)
CO2: 28 mmol/L (ref 22–32)
Calcium: 9.8 mg/dL (ref 8.9–10.3)
Chloride: 106 mmol/L (ref 98–111)
Creatinine: 0.94 mg/dL (ref 0.44–1.00)
GFR, Estimated: >60 mL/min (ref >60)
Glucose, Bld: 86 mg/dL (ref 70–99)
Potassium: 3.1 mmol/L — ABNORMAL LOW (ref 3.5–5.1)
Sodium: 143 mmol/L (ref 135–145)
Total Bilirubin: 0.3 mg/dL (ref 0.3–1.2)
Total Protein: 7.3 g/dL (ref 6.5–8.1)

## 2023-04-02 LAB — CBC WITH DIFFERENTIAL (CANCER CENTER ONLY)
Abs Immature Granulocytes: 0.03 10*3/uL (ref 0.00–0.07)
Basophils Absolute: 0 10*3/uL (ref 0.0–0.1)
Basophils Relative: 1 %
Eosinophils Absolute: 0 10*3/uL (ref 0.0–0.5)
Eosinophils Relative: 1 %
HCT: 38.4 % (ref 36.0–46.0)
Hemoglobin: 13.5 g/dL (ref 12.0–15.0)
Immature Granulocytes: 1 %
Lymphocytes Relative: 24 %
Lymphs Abs: 1.2 10*3/uL (ref 0.7–4.0)
MCH: 32.3 pg (ref 26.0–34.0)
MCHC: 35.2 g/dL (ref 30.0–36.0)
MCV: 91.9 fL (ref 80.0–100.0)
Monocytes Absolute: 0.4 10*3/uL (ref 0.1–1.0)
Monocytes Relative: 9 %
Neutro Abs: 3.1 10*3/uL (ref 1.7–7.7)
Neutrophils Relative %: 64 %
Platelet Count: 458 10*3/uL — ABNORMAL HIGH (ref 150–400)
RBC: 4.18 MIL/uL (ref 3.87–5.11)
RDW: 13.8 % (ref 11.5–15.5)
WBC Count: 4.9 10*3/uL (ref 4.0–10.5)
nRBC: 0 % (ref 0.0–0.2)

## 2023-04-02 MED ORDER — ELACESTRANT HYDROCHLORIDE 345 MG PO TABS
345.0000 mg | ORAL_TABLET | Freq: Every day | ORAL | 6 refills | Status: DC
Start: 2023-04-02 — End: 2023-10-11

## 2023-04-02 MED ORDER — ELACESTRANT HYDROCHLORIDE 345 MG PO TABS
345.0000 mg | ORAL_TABLET | Freq: Every day | ORAL | 6 refills | Status: DC
  Filled 2023-04-02: qty 30, 30d supply, fill #0

## 2023-04-02 NOTE — Assessment & Plan Note (Addendum)
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   Treatment summary: 1. Right mastectomy 08/29/2015 at [facility] (Dr.Singh): IDC with invol of skin and skin ulceration, breast capsule inv cancer, superior medial margin positive, 1/1 LN positive, grade 3, 14.3 cm, 3.5 cm, ALI, chest wall involv, ER 90-100%, PR 80-90%, HER-2 negative, Ki 67 40-50% T4CN1 (St 3B) 2. Patient started tamoxifen but developed severe headache with severe hypertension and tamoxifen was discontinued 03/17/2016.  3. 02/2017 started on Exemestane, Zoladex started 05/29/2017 4.  Hospitalization for paraplegia: T9 vertebral body compression status post decompressive laminectomy and tumor resection 01/04/2020 5.  Palliative radiation to the thoracic spine 01/28/2020 6.  Ibrance with letrozole started April 2021 discontinued 07/24/2021 7.  Enhertu started 10/17/2021 every 3 weeks discontinued May 2024 due to progression 8.  Elacestrant 04/10/2023 ----------------------------------------------------------------------------------------------------------------- CT chest 03/10/2023: Progressive osseous and chest wall metastasis (sternal mass 6.7 cm previously 6.2, manubrium 2.4 cm previously 1.5 cm, right posterior ninth rib 4.2 cm was previously 3.6 cm, right chest wall mass 6.4 cm was previously 6.1 cm, adjacent 4.6 cm chest wall mass previously 4.1 cm   Treatment plan: Start Elacestrant as soon as it becomes available (patient is postmenopausal)   New onset vertigo: Will obtain a brain MRI stat I will call her with the result of the brain MRI for Telephone visit in 1 month to discuss results

## 2023-04-02 NOTE — Telephone Encounter (Signed)
Oral Oncology Patient Advocate Encounter   Received notification that prior authorization for Orserdu is required.   PA submitted on 04/02/23 Key [id number] Status is pending     Jinger Neighbors, CPhT-Adv Oncology Pharmacy Patient Advocate [facility] Direct Number: [phone]  Fax: [phone]

## 2023-04-02 NOTE — Telephone Encounter (Signed)
Oral Oncology Patient Advocate Encounter  Prior Authorization for Orserdu has been approved.    PA# [id number] Effective dates: 04/02/23 through 09/29/23  Patients co-pay is $11.20.    Jinger Neighbors, CPhT-Adv Oncology Pharmacy Patient Advocate [facility] Direct Number: [phone]  Fax: [phone]

## 2023-04-02 NOTE — Telephone Encounter (Signed)
Oral Oncology Pharmacist Encounter  Received new prescription for Orserdu (elacestrant) for the treatment of ESR1 mutated breast cancer, planned duration until disease progression or unacceptable toxicity .  Labs from 04/02/23 assessed, no interventions needed. Prescription dose and frequency assessed for appropriateness.   Current medication list in Epic reviewed, no significant/ relevant DDIs with Orserdu identified.  Evaluated chart and no patient barriers to medication adherence noted.   Patient agreement for treatment documented in MD note on 04/02/23.  Prescription has been e-scribed to the [facility] for benefits analysis and approval.  Oral Oncology Clinic will continue to follow for insurance authorization, copayment issues, initial counseling and start date.  Bethel Born, PharmD Hematology/Oncology Clinical Pharmacist [facility] Oral Chemotherapy Navigation Clinic [phone] 04/02/2023 11:53 AM

## 2023-04-03 ENCOUNTER — Telehealth: Payer: Self-pay | Admitting: *Deleted

## 2023-04-03 ENCOUNTER — Encounter: Payer: Self-pay | Admitting: Hematology and Oncology

## 2023-04-03 ENCOUNTER — Encounter: Payer: Self-pay | Admitting: Nurse Practitioner

## 2023-04-03 ENCOUNTER — Other Ambulatory Visit: Payer: Self-pay | Admitting: *Deleted

## 2023-04-03 DIAGNOSIS — Z170 Estrogen receptor positive status [ER+]: Secondary | ICD-10-CM

## 2023-04-03 NOTE — Telephone Encounter (Signed)
This RN scheduled pt for MRI stat at [facility] per MD request- pt was unable to obtain due to time and issues with her driver not being able to stay.  Pt left [facility] without rescheduling.  This RN rescheduled above for Friday 04/05/2023 at 1150 to be there at [facility] at 1120.  Called and informed pt of above with her stating " I will get with my people and be there"  Not further needs at this time.

## 2023-04-03 NOTE — Progress Notes (Addendum)
Per pt request RN successfully faxed home health RN, wound care, and PT orders to [facility] [phone]).

## 2023-04-04 ENCOUNTER — Inpatient Hospital Stay: Payer: Medicare PPO | Admitting: Licensed Clinical Social Worker

## 2023-04-04 NOTE — Progress Notes (Signed)
[facility] Clinical Social Work  Clinical Social Work was referred by  palliative care  for assessment of psychosocial needs.  Clinical Social Worker contacted patient by phone to offer support and assess for needs.   Patient has been staying in [place], [place] which is difficult due to distance from cancer center. She is interested in moving closer and any resources to help in a housing search. CSW provided contact information for [employer] and [employer] for [employer] & Community [employer] which maintain information on affordable local housing.  Pt also inquired about any available grants. Currently, many organizations grants are closed. CSW provided information on [employer] which is opening their grant on 04/08/23.  No other needs at this time. CSW encouraged pt to call with any other questions.    Erick Oxendine E Chau Savell, LCSW  Clinical Social Worker [facility]

## 2023-04-05 ENCOUNTER — Ambulatory Visit (HOSPITAL_COMMUNITY)
Admission: RE | Admit: 2023-04-05 | Discharge: 2023-04-05 | Disposition: A | Discharge status: Home / Self Care | Payer: Medicare PPO | Source: Ambulatory Visit | Attending: Hematology and Oncology | Admitting: Hematology and Oncology

## 2023-04-05 ENCOUNTER — Telehealth: Payer: Self-pay

## 2023-04-05 ENCOUNTER — Telehealth: Payer: Self-pay | Admitting: *Deleted

## 2023-04-05 DIAGNOSIS — C50919 Malignant neoplasm of unspecified site of unspecified female breast: Secondary | ICD-10-CM | POA: Insufficient documentation

## 2023-04-05 DIAGNOSIS — Z170 Estrogen receptor positive status [ER+]: Secondary | ICD-10-CM | POA: Insufficient documentation

## 2023-04-05 DIAGNOSIS — C7949 Secondary malignant neoplasm of other parts of nervous system: Secondary | ICD-10-CM | POA: Diagnosis present

## 2023-04-05 DIAGNOSIS — C50411 Malignant neoplasm of upper-outer quadrant of right female breast: Secondary | ICD-10-CM | POA: Insufficient documentation

## 2023-04-05 MED ORDER — GADOBUTROL 1 MMOL/ML IV SOLN
5.0000 mL | Freq: Once | INTRAVENOUS | Status: AC | PRN
  Administered 2023-04-05: 5 mL via INTRAVENOUS

## 2023-04-05 NOTE — Telephone Encounter (Signed)
Informed the MRI that was rescheduled per this RN's call on 5/28 "was not scheduled" - this RN spoke with [facility] they would not be able to accommodate patient until later today.  Peer nurse was able to get pt scheduled at [facility] at 230 (sooner then [facility] could do) and informed pt who stated she would be able to go.

## 2023-04-05 NOTE — Telephone Encounter (Signed)
Pt called stating that she is currently at GI and they do not have record of her STAT Brain MRI scheduled for today at 1120. GI states they are unable to accommodate MRI today. Called central scheduling and scheduled Pt at [facility] with arrival time of 1430. Pt verbalized understanding.

## 2023-04-08 ENCOUNTER — Telehealth: Payer: Self-pay | Admitting: Hematology and Oncology

## 2023-04-08 NOTE — Telephone Encounter (Signed)
I informed the patient of the brain MRI did not show any evidence of metastatic disease.  White matter changes

## 2023-04-09 NOTE — Telephone Encounter (Signed)
Oral Oncology Pharmacist Encounter  Attempted to call patient to discuss new medication, Orserdu. Was unable to get a hold of patient and left a voicemail. Will continue to try to reach patient.  Bethel Born, PharmD Hematology/Oncology Clinical Pharmacist [facility] [phone]

## 2023-04-11 NOTE — Telephone Encounter (Signed)
Oral Chemotherapy Pharmacist Encounter   Patient will start Orserdu (elacestrant) on 04/07/2023   Patient Education I spoke with patient for overview of new oral chemotherapy medication: Orserdu (elacestrant) for the treatment of ESR1 mutated breast cancer, planned duration until disease progression or unacceptable drug toxicity.   Counseled patient on administration, dosing, side effects, monitoring, drug-food interactions, safe handling, storage, and disposal.   Patient will take Orserdu 345 mg tablets, 1 tablet (345 mg total) by mouth once daily with food.   Side effects include but not limited to: nausea/vomiting, musculoskeletal pain, changes in liver function tests, and fatigue.   Patient has anti-emetic on hand and knows to take it if nausea develops.     Reviewed with patient importance of keeping a medication schedule and plan for any missed doses.   After discussion no patient barriers to medication adherence identified.    Patient voiced understanding and appreciation. All questions answered. Medication handout provided.   Provided patient with [facility] Chemotherapy Navigation Clinic phone number. Patient knows to call the office with questions or concerns. Oral Chemotherapy Navigation Clinic will continue to follow.  Bethel Born, PharmD Hematology/Oncology Clinical Pharmacist [facility]otherapy [facility] [phone] 04/11/2023 10:26 AM

## 2023-04-23 ENCOUNTER — Ambulatory Visit: Payer: Medicare PPO

## 2023-04-23 ENCOUNTER — Other Ambulatory Visit: Payer: Medicare PPO

## 2023-04-23 ENCOUNTER — Ambulatory Visit: Payer: Medicare PPO | Admitting: Hematology and Oncology

## 2023-04-28 NOTE — Progress Notes (Signed)
Patient Care Team: Serena Croissant, MD as PCP - General (Hematology and Oncology)  DIAGNOSIS:  Encounter Diagnosis  Name Primary?   Malignant neoplasm of upper-outer quadrant of right breast in female, estrogen receptor positive (HCC) Yes    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; patient went to cancer treatment centers of [place] for second opinion and delayed therapy   01/26/2011 Surgery   right mastectomy followed by reconstruction: Invasive ductal carcinoma T1 N1 MIC M0 stage IB ER/PR positive HER-2 negative, BRCA negative, Oncotype DX low risk   05/29/2012 Procedure   right chest wall nodule excision done on 06/24/2012 showed metastatic carcinoma margins were positive, but CT scan no metastatic disease, recommended chemotherapy but patient refused also refused reexcision   04/28/2013 Treatment Plan Change   patient went to [place] for alternative treatments and took herbal medications, tonics etc. But she could not afford these trips.   01/08/2014 Breast MRI   right breast multiple enhancing masses within the soft tissues largest 5.6 cm in wall skin surface and the capsule of the silicone prosthesis, contiguous nodules involving in inferomedial breast and tired and subcentimeter nodules across the midline   11/02/2014 Cancer Staging   Staging form: Breast, AJCC 7th Edition - Clinical: Stage IV (T4b, N0, M1) - Signed by Serena Croissant, MD on 12/18/2022   08/29/2015 Surgery   Right mastectomy: IDC with invol of skin and skin ulceration, breast capsule inv cancer, superior medial margin positive, 1/1 LN positive, grade 3, 14.3 cm, 3.5 cm, ALI, chest wall involv, ER  90-100%, PR 80-90%, HER-2 negative, Ki 67 40-50% T4CN1 (St 3B)   03/05/2016 -  Anti-estrogen oral therapy   Tamoxifen 20 mg daily stopped due to headache and uncontrolled hypertension. Decrease to 10 mg daily 04/03/2016, stopped June 2017 and took an estrogen metabolizer over-the-counter; started Aromasin April 2018 from a [place] physician, Zoladex started on 05/2017 and switched to Letrozole in 09/2017   01/04/2020 Surgery   Patient presented to the ED on 01/04/20 for worsening lower extremity numbness and underwent a decompressive laminectomy with tumor resection at T9 that was complicated with acute blood loss anemia and leukocytosis.   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)  01/04/2020 Initial Diagnosis   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)   10/17/2021 - 06/06/2022 Chemotherapy   Patient is on Treatment Plan : BREAST METASTATIC fam-trastuzumab deruxtecan-nxki (Enhertu) q21d     10/17/2021 -  Chemotherapy   Patient is on Treatment Plan : BREAST METASTATIC Fam-Trastuzumab Deruxtecan-nxki (Enhertu) (5.4) q21d     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)  01/08/2020 Initial Diagnosis   Metastatic breast cancer (  HCC)   10/17/2021 - 06/06/2022 Chemotherapy   Patient is on Treatment Plan : BREAST METASTATIC fam-trastuzumab deruxtecan-nxki (Enhertu) q21d     10/17/2021 -  Chemotherapy   Patient is on Treatment Plan : BREAST METASTATIC  Fam-Trastuzumab Deruxtecan-nxki (Enhertu) (5.4) q21d       CHIEF COMPLIANT: Follow up of metastatic breast cancer on Elacestrant  INTERVAL HISTORY: Misty Arnold is a 52 y.o. with above-mentioned history of metastatic breast cancer was on Elacestrant. She presents to the clinic for a follow-up.  She is tolerating Elacestrant extremely well without any problems or concerns.  If she takes it with food she does not have any nausea issues.   ALLERGIES:  is allergic to amoxicillin, penicillins, pork-derived products, penicillin g, and tamoxifen.  MEDICATIONS:  Current Outpatient Medications  Medication Sig Dispense Refill   atenolol (TENORMIN) 50 MG tablet Take 1 tablet (50 mg total) by mouth 2 (two) times daily. 180 tablet 3   celecoxib (CELEBREX) 200 MG capsule Take 1 capsule (200 mg total) by mouth at bedtime. 30 capsule 6   Cholecalciferol (VITAMIN D3) 125 MCG (5000 UT) CAPS Take 1 capsule (5,000 Units total) by mouth daily. 30 capsule 3   elacestrant hydrochloride (ORSERDU) 345 MG tablet Take 1 tablet (345 mg total) by mouth daily. Take with food. 30 tablet 6   metroNIDAZOLE (METROGEL) 0.75 % gel Apply topically 2 (two) times daily. 45 g 6   Multiple Vitamins-Minerals (MULTIVITAMIN WITH MINERALS) tablet FLORADIX Supplement-Iron +minerals     Nutritional Supplements (,FEEDING SUPPLEMENT, PROSOURCE PLUS) liquid Take 30 mLs by mouth 4 (four) times daily - after meals and at bedtime.     ondansetron (ZOFRAN) 8 MG tablet Take by mouth.     polyethylene glycol (MIRALAX / GLYCOLAX) 17 g packet Take 17 g by mouth daily. 30 each 0   potassium chloride SA (KLOR-CON M) 20 MEQ tablet Take 1 tablet by mouth daily. 30 tablet 3   Silver-Carboxymethylcellulose (AQUACEL AG ADVANTAGE) 4"X5" PADS Apply 1 Pad topically in the morning and at bedtime. 60 each 3   No current facility-administered medications for this visit.    PHYSICAL EXAMINATION: ECOG PERFORMANCE STATUS: 1 - Symptomatic but completely  ambulatory  Vitals:   05/03/23 1239  BP: (!) 139/93  Pulse: (!) 114  Resp: 18  Temp: (!) 97.3 F (36.3 C)  SpO2: 99%   Filed Weights   05/03/23 1239  Weight: 119 lb 9.6 oz (54.3 kg)      LABORATORY DATA:  I have reviewed the data as listed    Latest Ref Rng & Units 05/03/2023   11:56 AM 04/02/2023   10:20 AM 03/12/2023    1:01 PM  CMP  Glucose 70 - 99 mg/dL 161  86  74   BUN 6 - 20 mg/dL 8  9  10    Creatinine 0.44 - 1.00 mg/dL 0.96  0.45  4.09   Sodium 135 - 145 mmol/L 141  143  142   Potassium 3.5 - 5.1 mmol/L 3.3  3.1  3.8   Chloride 98 - 111 mmol/L 107  106  109   CO2 22 - 32 mmol/L 27  28  26    Calcium 8.9 - 10.3 mg/dL 9.5  9.8  9.5   Total Protein 6.5 - 8.1 g/dL 7.0  7.3  7.0   Total Bilirubin 0.3 - 1.2 mg/dL 0.4  0.3  0.3   Alkaline Phos 38 - 126 U/L 95  102  102   AST 15 -  41 U/L 21  30  26    ALT 0 - 44 U/L 9  13  9      Lab Results  Component Value Date   WBC 5.2 05/03/2023   HGB 13.4 05/03/2023   HCT 40.4 05/03/2023   MCV 94.2 05/03/2023   PLT 244 05/03/2023   NEUTROABS 3.8 05/03/2023    ASSESSMENT & PLAN:  Breast cancer of upper-outer quadrant of right female breast (HCC) Recurrent right breast cancer initially DCIS treated with mastectomy followed by reconstruction and later developed chest wall recurrence in 2013 ER/PR positive HER-2 negative, patient underwent alternative therapies in [place] but could not afford the trips and hence she has not been on any breast cancer therapy for a long time.   Treatment summary: 1. Right mastectomy 08/29/2015 at [facility] (Dr.Singh): IDC with invol of skin and skin ulceration, breast capsule inv cancer, superior medial margin positive, 1/1 LN positive, grade 3, 14.3 cm, 3.5 cm, ALI, chest wall involv, ER 90-100%, PR 80-90%, HER-2 negative, Ki 67 40-50% T4CN1 (St 3B) 2. Patient started tamoxifen but developed severe headache with severe hypertension and tamoxifen was discontinued 03/17/2016.  3. 02/2017 started on  Exemestane, Zoladex started 05/29/2017 4.  Hospitalization for paraplegia: T9 vertebral body compression status post decompressive laminectomy and tumor resection 01/04/2020 5.  Palliative radiation to the thoracic spine 01/28/2020 6.  Ibrance with letrozole started April 2021 discontinued 07/24/2021 7.  Enhertu started 10/17/2021 every 3 weeks discontinued May 2024 due to progression 8.  Elacestrant 04/10/2023 ----------------------------------------------------------------------------------------------------------------- CT chest 03/10/2023: Progressive osseous and chest wall metastasis (sternal mass 6.7 cm previously 6.2, manubrium 2.4 cm previously 1.5 cm, right posterior ninth rib 4.2 cm was previously 3.6 cm, right chest wall mass 6.4 cm was previously 6.1 cm, adjacent 4.6 cm chest wall mass previously 4.1 cm   Treatment plan: Elacestrant (patient is postmenopausal) started Toxicities: Tolerating it extremely well without any nausea or issues.   Vertigo: Brain MRI negative  Return to clinic in 2 months for follow-up with labs    Orders Placed This Encounter  Procedures   CBC with Differential (Cancer Center Only)    Standing Status:   Future    Standing Expiration Date:   05/02/2024   CMP (Cancer Center only)    Standing Status:   Future    Standing Expiration Date:   05/02/2024   The patient has a good understanding of the overall plan. she agrees with it. she will call with any problems that may develop before the next visit here. Total time spent: 30 mins including face to face time and time spent for planning, charting and co-ordination of care   Tamsen Meek, MD 05/03/23    I Janan Ridge am acting as a Neurosurgeon for [employer]  I have reviewed the above documentation for accuracy and completeness, and I agree with the above.

## 2023-05-03 ENCOUNTER — Inpatient Hospital Stay: Payer: Medicare PPO

## 2023-05-03 ENCOUNTER — Inpatient Hospital Stay: Payer: Medicare PPO | Attending: Hematology and Oncology | Admitting: Hematology and Oncology

## 2023-05-03 ENCOUNTER — Other Ambulatory Visit (HOSPITAL_COMMUNITY): Payer: Self-pay

## 2023-05-03 ENCOUNTER — Other Ambulatory Visit: Payer: Self-pay

## 2023-05-03 VITALS — BP 139/93 | HR 114 | Temp 97.3°F | Resp 18 | Ht 65.0 in | Wt 119.6 lb

## 2023-05-03 DIAGNOSIS — C50411 Malignant neoplasm of upper-outer quadrant of right female breast: Secondary | ICD-10-CM | POA: Insufficient documentation

## 2023-05-03 DIAGNOSIS — R222 Localized swelling, mass and lump, trunk: Secondary | ICD-10-CM | POA: Diagnosis not present

## 2023-05-03 DIAGNOSIS — Z9011 Acquired absence of right breast and nipple: Secondary | ICD-10-CM | POA: Insufficient documentation

## 2023-05-03 DIAGNOSIS — C7951 Secondary malignant neoplasm of bone: Secondary | ICD-10-CM | POA: Insufficient documentation

## 2023-05-03 DIAGNOSIS — Z170 Estrogen receptor positive status [ER+]: Secondary | ICD-10-CM | POA: Insufficient documentation

## 2023-05-03 DIAGNOSIS — R530 Neoplastic (malignant) related fatigue: Secondary | ICD-10-CM

## 2023-05-03 LAB — CBC WITH DIFFERENTIAL (CANCER CENTER ONLY)
Abs Immature Granulocytes: 0.02 10*3/uL (ref 0.00–0.07)
Basophils Absolute: 0 10*3/uL (ref 0.0–0.1)
Basophils Relative: 0 %
Eosinophils Absolute: 0 10*3/uL (ref 0.0–0.5)
Eosinophils Relative: 0 %
HCT: 40.4 % (ref 36.0–46.0)
Hemoglobin: 13.4 g/dL (ref 12.0–15.0)
Immature Granulocytes: 0 %
Lymphocytes Relative: 19 %
Lymphs Abs: 1 10*3/uL (ref 0.7–4.0)
MCH: 31.2 pg (ref 26.0–34.0)
MCHC: 33.2 g/dL (ref 30.0–36.0)
MCV: 94.2 fL (ref 80.0–100.0)
Monocytes Absolute: 0.4 10*3/uL (ref 0.1–1.0)
Monocytes Relative: 8 %
Neutro Abs: 3.8 10*3/uL (ref 1.7–7.7)
Neutrophils Relative %: 73 %
Platelet Count: 244 10*3/uL (ref 150–400)
RBC: 4.29 MIL/uL (ref 3.87–5.11)
RDW: 13.4 % (ref 11.5–15.5)
WBC Count: 5.2 10*3/uL (ref 4.0–10.5)
nRBC: 0 % (ref 0.0–0.2)

## 2023-05-03 LAB — CMP (CANCER CENTER ONLY)
ALT: 9 U/L (ref 0–44)
AST: 21 U/L (ref 15–41)
Albumin: 3.8 g/dL (ref 3.5–5.0)
Alkaline Phosphatase: 95 U/L (ref 38–126)
Anion gap: 7 (ref 5–15)
BUN: 8 mg/dL (ref 6–20)
CO2: 27 mmol/L (ref 22–32)
Calcium: 9.5 mg/dL (ref 8.9–10.3)
Chloride: 107 mmol/L (ref 98–111)
Creatinine: 0.84 mg/dL (ref 0.44–1.00)
GFR, Estimated: >60 mL/min (ref >60)
Glucose, Bld: 125 mg/dL — ABNORMAL HIGH (ref 70–99)
Potassium: 3.3 mmol/L — ABNORMAL LOW (ref 3.5–5.1)
Sodium: 141 mmol/L (ref 135–145)
Total Bilirubin: 0.4 mg/dL (ref 0.3–1.2)
Total Protein: 7 g/dL (ref 6.5–8.1)

## 2023-05-03 MED ORDER — METRONIDAZOLE 0.75 % EX GEL
Freq: Two times a day (BID) | CUTANEOUS | 6 refills | Status: DC
  Filled 2023-05-03 – 2023-05-24 (×2): qty 45, 30d supply, fill #0

## 2023-05-03 NOTE — Assessment & Plan Note (Signed)
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   Treatment summary: 1. Right mastectomy 08/29/2015 at [facility] (Dr.Singh): IDC with invol of skin and skin ulceration, breast capsule inv cancer, superior medial margin positive, 1/1 LN positive, grade 3, 14.3 cm, 3.5 cm, ALI, chest wall involv, ER 90-100%, PR 80-90%, HER-2 negative, Ki 67 40-50% T4CN1 (St 3B) 2. Patient started tamoxifen but developed severe headache with severe hypertension and tamoxifen was discontinued 03/17/2016.  3. 02/2017 started on Exemestane, Zoladex started 05/29/2017 4.  Hospitalization for paraplegia: T9 vertebral body compression status post decompressive laminectomy and tumor resection 01/04/2020 5.  Palliative radiation to the thoracic spine 01/28/2020 6.  Ibrance with letrozole started April 2021 discontinued 07/24/2021 7.  Enhertu started 10/17/2021 every 3 weeks discontinued May 2024 due to progression 8.  Elacestrant 04/10/2023 ----------------------------------------------------------------------------------------------------------------- CT chest 03/10/2023: Progressive osseous and chest wall metastasis (sternal mass 6.7 cm previously 6.2, manubrium 2.4 cm previously 1.5 cm, right posterior ninth rib 4.2 cm was previously 3.6 cm, right chest wall mass 6.4 cm was previously 6.1 cm, adjacent 4.6 cm chest wall mass previously 4.1 cm   Treatment plan: Elacestrant (patient is postmenopausal) started Toxicities: vertigo: Brain MRI negative  Return to clinic in 1 month for follow-up with Jonny Ruiz

## 2023-05-14 ENCOUNTER — Other Ambulatory Visit (HOSPITAL_COMMUNITY): Payer: Self-pay

## 2023-05-23 ENCOUNTER — Telehealth: Payer: Self-pay | Admitting: *Deleted

## 2023-05-24 ENCOUNTER — Telehealth: Payer: Self-pay | Admitting: *Deleted

## 2023-05-24 ENCOUNTER — Other Ambulatory Visit (HOSPITAL_COMMUNITY): Payer: Self-pay

## 2023-05-24 ENCOUNTER — Encounter: Payer: Self-pay | Admitting: Hematology and Oncology

## 2023-05-24 ENCOUNTER — Other Ambulatory Visit: Payer: Self-pay

## 2023-05-24 ENCOUNTER — Other Ambulatory Visit: Payer: Self-pay | Admitting: *Deleted

## 2023-05-24 DIAGNOSIS — T148XXA Other injury of unspecified body region, initial encounter: Secondary | ICD-10-CM

## 2023-05-24 DIAGNOSIS — C801 Malignant (primary) neoplasm, unspecified: Secondary | ICD-10-CM

## 2023-05-24 MED ORDER — LEVOFLOXACIN 750 MG PO TABS
750.0000 mg | ORAL_TABLET | Freq: Every day | ORAL | 0 refills | Status: DC
Start: 2023-05-24 — End: 2023-05-24

## 2023-05-24 MED ORDER — LEVOFLOXACIN 750 MG PO TABS
750.0000 mg | ORAL_TABLET | Freq: Every day | ORAL | 0 refills | Status: DC
  Filled 2023-05-24: qty 10, 10d supply, fill #0

## 2023-05-24 NOTE — Telephone Encounter (Signed)
Opened in error

## 2023-05-24 NOTE — Telephone Encounter (Signed)
Late entry for 05/23/23-patient called, LVM. Has concerns about right breast/chest wound. Worried about increased chance of infection with increase in heat and humidity.having some sharp pains near wound edges.She wondered if she needed an antibiotic. Patient states she is changing dressing 2 x day as ordered and a home health nurse comes 2 x week to change dressing and evaluate wound, she is coming on 05/23/23. Ms. Kontz will ask [facility] nurse to call this office with update. Contacted by Autumn, nurse with [facility] with following info - wound drainage has increased and is darker and cloudier. Drainage does NOT have odor. Patient does NOT have elevated temp. HR is up slightly 103-113.  Dr. Pamelia Hoit informed. Received verbal order for Levaquin, 750 mg tablet, Take 1 by mouth daily for 10 days. Prescription called to Arlys John at [facility]. Patient informed of rx called in. She verbalized understanding and expressed appreciation for communication. She asked that office contact Autumn with update. Contacted Autumn @ [phone] and lvm with info

## 2023-06-26 ENCOUNTER — Other Ambulatory Visit: Payer: Self-pay | Admitting: Hematology and Oncology

## 2023-06-26 DIAGNOSIS — Z170 Estrogen receptor positive status [ER+]: Secondary | ICD-10-CM

## 2023-06-26 DIAGNOSIS — C50919 Malignant neoplasm of unspecified site of unspecified female breast: Secondary | ICD-10-CM

## 2023-06-26 DIAGNOSIS — C7949 Secondary malignant neoplasm of other parts of nervous system: Secondary | ICD-10-CM

## 2023-06-28 ENCOUNTER — Telehealth: Payer: Self-pay | Admitting: *Deleted

## 2023-06-28 ENCOUNTER — Telehealth: Payer: Self-pay

## 2023-06-28 ENCOUNTER — Other Ambulatory Visit: Payer: Self-pay | Admitting: *Deleted

## 2023-06-28 NOTE — Telephone Encounter (Signed)
Need for transportation

## 2023-06-28 NOTE — Telephone Encounter (Signed)
Pt called regarding transportation concerns and gaining approval for her appt. This RN reached out to transportation coordinator services. [facility] AM[facility] Transportation Exception form filled out and pt contacted that this has been completed.

## 2023-06-28 NOTE — Telephone Encounter (Signed)
Pt called and requested we order Aquacel AG for her as she is receiving many different brands and she is concerned about this.  Advised pt she would need to consult with wound center about this specific brand as we are not able to order this for her. She verbalized thanks and understanding.

## 2023-06-28 NOTE — Telephone Encounter (Signed)
Transportation Exception Form  * If the practice/clinic is more than 25 miles from the patient's address, please provide the following information:    Patients Name Misty Arnold Up Date   Patient DOB Apr 11, 1971 Practice Location   Patient MRN [id number]    Patient Home Address [address] [place] [address]      Description of Clinical Conditions Necessitating Transport  Transportation to oncology medical appointments / medically necessary in stage 4 breast cancer under therapy       Electronic Signature of Requester: Cambreigh Crisafi  DATE:  06/28/2023

## 2023-06-28 NOTE — Progress Notes (Signed)
Request for override for transportation form completed and sent to [facility]

## 2023-07-03 ENCOUNTER — Other Ambulatory Visit: Payer: Self-pay | Admitting: Hematology and Oncology

## 2023-07-03 ENCOUNTER — Inpatient Hospital Stay (HOSPITAL_BASED_OUTPATIENT_CLINIC_OR_DEPARTMENT_OTHER): Payer: Medicare PPO | Admitting: Hematology and Oncology

## 2023-07-03 ENCOUNTER — Encounter: Payer: Self-pay | Admitting: Nurse Practitioner

## 2023-07-03 ENCOUNTER — Inpatient Hospital Stay: Payer: Medicare PPO

## 2023-07-03 ENCOUNTER — Other Ambulatory Visit (HOSPITAL_COMMUNITY): Payer: Self-pay

## 2023-07-03 ENCOUNTER — Inpatient Hospital Stay: Payer: Medicare PPO | Attending: Hematology and Oncology

## 2023-07-03 ENCOUNTER — Other Ambulatory Visit: Payer: Self-pay | Admitting: *Deleted

## 2023-07-03 ENCOUNTER — Inpatient Hospital Stay (HOSPITAL_BASED_OUTPATIENT_CLINIC_OR_DEPARTMENT_OTHER): Payer: Medicare PPO | Admitting: Nurse Practitioner

## 2023-07-03 VITALS — BP 174/107 | HR 89 | Temp 97.3°F | Resp 18 | Ht 65.0 in | Wt 124.0 lb

## 2023-07-03 VITALS — BP 158/96 | HR 91 | Temp 98.1°F | Resp 18 | Ht 65.0 in | Wt 125.3 lb

## 2023-07-03 DIAGNOSIS — R5383 Other fatigue: Secondary | ICD-10-CM | POA: Insufficient documentation

## 2023-07-03 DIAGNOSIS — C50411 Malignant neoplasm of upper-outer quadrant of right female breast: Secondary | ICD-10-CM

## 2023-07-03 DIAGNOSIS — Z9012 Acquired absence of left breast and nipple: Secondary | ICD-10-CM | POA: Insufficient documentation

## 2023-07-03 DIAGNOSIS — Z515 Encounter for palliative care: Secondary | ICD-10-CM

## 2023-07-03 DIAGNOSIS — R42 Dizziness and giddiness: Secondary | ICD-10-CM | POA: Diagnosis not present

## 2023-07-03 DIAGNOSIS — Z170 Estrogen receptor positive status [ER+]: Secondary | ICD-10-CM | POA: Insufficient documentation

## 2023-07-03 DIAGNOSIS — C7949 Secondary malignant neoplasm of other parts of nervous system: Secondary | ICD-10-CM

## 2023-07-03 DIAGNOSIS — T148XXA Other injury of unspecified body region, initial encounter: Secondary | ICD-10-CM | POA: Diagnosis not present

## 2023-07-03 DIAGNOSIS — C50919 Malignant neoplasm of unspecified site of unspecified female breast: Secondary | ICD-10-CM

## 2023-07-03 DIAGNOSIS — R530 Neoplastic (malignant) related fatigue: Secondary | ICD-10-CM | POA: Diagnosis not present

## 2023-07-03 DIAGNOSIS — C801 Malignant (primary) neoplasm, unspecified: Secondary | ICD-10-CM

## 2023-07-03 DIAGNOSIS — C7951 Secondary malignant neoplasm of bone: Secondary | ICD-10-CM | POA: Diagnosis not present

## 2023-07-03 LAB — CBC WITH DIFFERENTIAL (CANCER CENTER ONLY)
Abs Immature Granulocytes: 0.02 10*3/uL (ref 0.00–0.07)
Basophils Absolute: 0 10*3/uL (ref 0.0–0.1)
Basophils Relative: 0 %
Eosinophils Absolute: 0 10*3/uL (ref 0.0–0.5)
Eosinophils Relative: 0 %
HCT: 43.9 % (ref 36.0–46.0)
Hemoglobin: 14.7 g/dL (ref 12.0–15.0)
Immature Granulocytes: 0 %
Lymphocytes Relative: 18 %
Lymphs Abs: 1.1 10*3/uL (ref 0.7–4.0)
MCH: 30.4 pg (ref 26.0–34.0)
MCHC: 33.5 g/dL (ref 30.0–36.0)
MCV: 90.7 fL (ref 80.0–100.0)
Monocytes Absolute: 0.3 10*3/uL (ref 0.1–1.0)
Monocytes Relative: 6 %
Neutro Abs: 4.7 10*3/uL (ref 1.7–7.7)
Neutrophils Relative %: 76 %
Platelet Count: 278 10*3/uL (ref 150–400)
RBC: 4.84 MIL/uL (ref 3.87–5.11)
RDW: 12.2 % (ref 11.5–15.5)
WBC Count: 6.1 10*3/uL (ref 4.0–10.5)
nRBC: 0 % (ref 0.0–0.2)

## 2023-07-03 LAB — CMP (CANCER CENTER ONLY)
ALT: 8 U/L (ref 0–44)
AST: 24 U/L (ref 15–41)
Albumin: 4.2 g/dL (ref 3.5–5.0)
Alkaline Phosphatase: 96 U/L (ref 38–126)
Anion gap: 7 (ref 5–15)
BUN: 9 mg/dL (ref 6–20)
CO2: 29 mmol/L (ref 22–32)
Calcium: 9.6 mg/dL (ref 8.9–10.3)
Chloride: 106 mmol/L (ref 98–111)
Creatinine: 0.84 mg/dL (ref 0.44–1.00)
GFR, Estimated: >60 mL/min (ref >60)
Glucose, Bld: 94 mg/dL (ref 70–99)
Potassium: 3.5 mmol/L (ref 3.5–5.1)
Sodium: 142 mmol/L (ref 135–145)
Total Bilirubin: 0.4 mg/dL (ref 0.3–1.2)
Total Protein: 7.4 g/dL (ref 6.5–8.1)

## 2023-07-03 MED ORDER — CELECOXIB 200 MG PO CAPS
200.0000 mg | ORAL_CAPSULE | Freq: Every day | ORAL | 6 refills | Status: DC
  Filled 2023-07-03: qty 30, 30d supply, fill #0
  Filled 2023-09-07: qty 30, 30d supply, fill #1

## 2023-07-03 MED ORDER — DIAZEPAM 5 MG PO TABS
5.0000 mg | ORAL_TABLET | Freq: Four times a day (QID) | ORAL | 3 refills | Status: DC | PRN
  Filled 2023-07-03: qty 30, 9d supply, fill #0

## 2023-07-03 MED ORDER — OXYCODONE HCL 10 MG PO TABS
10.0000 mg | ORAL_TABLET | ORAL | 0 refills | Status: DC | PRN
  Filled 2023-07-03: qty 180, 23d supply, fill #0

## 2023-07-03 MED ORDER — "AQUACEL AG ADVANTAGE 4""X5"" EX PADS"
1.0000 | MEDICATED_PAD | Freq: Two times a day (BID) | CUTANEOUS | 3 refills | Status: DC
  Filled 2023-07-03: qty 60, fill #0

## 2023-07-03 MED ORDER — METRONIDAZOLE 0.75 % EX GEL
Freq: Two times a day (BID) | CUTANEOUS | 6 refills | Status: DC
  Filled 2023-07-03: qty 45, 30d supply, fill #0

## 2023-07-03 MED ORDER — DIAZEPAM 5 MG PO TABS: 5.0000 mg | ORAL_TABLET | Freq: Four times a day (QID) | ORAL | 3 refills | Status: DC | PRN

## 2023-07-03 NOTE — Progress Notes (Signed)
Patient Care Team: Serena Croissant, MD as PCP - General (Hematology and Oncology)  DIAGNOSIS:  Encounter Diagnosis  Name Primary?   Malignant neoplasm of upper-outer quadrant of right breast in female, estrogen receptor positive (HCC) Yes    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; patient went to cancer treatment centers of [place] for second opinion and delayed therapy   01/26/2011 Surgery   right mastectomy followed by reconstruction: Invasive ductal carcinoma T1 N1 MIC M0 stage IB ER/PR positive HER-2 negative, BRCA negative, Oncotype DX low risk   05/29/2012 Procedure   right chest wall nodule excision done on 06/24/2012 showed metastatic carcinoma margins were positive, but CT scan no metastatic disease, recommended chemotherapy but patient refused also refused reexcision   04/28/2013 Treatment Plan Change   patient went to [place] for alternative treatments and took herbal medications, tonics etc. But she could not afford these trips.   01/08/2014 Breast MRI   right breast multiple enhancing masses within the soft tissues largest 5.6 cm in wall skin surface and the capsule of the silicone prosthesis, contiguous nodules involving in inferomedial breast and tired and subcentimeter nodules across the midline   11/02/2014 Cancer Staging   Staging form: Breast, AJCC 7th Edition - Clinical: Stage IV (T4b, N0, M1) - Signed by Serena Croissant, MD on 12/18/2022   08/29/2015 Surgery   Right mastectomy: IDC with invol of skin and skin ulceration, breast capsule inv cancer, superior medial margin positive, 1/1 LN positive, grade 3, 14.3 cm, 3.5 cm, ALI, chest wall involv, ER  90-100%, PR 80-90%, HER-2 negative, Ki 67 40-50% T4CN1 (St 3B)   03/05/2016 -  Anti-estrogen oral therapy   Tamoxifen 20 mg daily stopped due to headache and uncontrolled hypertension. Decrease to 10 mg daily 04/03/2016, stopped June 2017 and took an estrogen metabolizer over-the-counter; started Aromasin April 2018 from a [place] physician, Zoladex started on 05/2017 and switched to Letrozole in 09/2017   01/04/2020 Surgery   Patient presented to the ED on 01/04/20 for worsening lower extremity numbness and underwent a decompressive laminectomy with tumor resection at T9 that was complicated with acute blood loss anemia and leukocytosis.   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)  01/04/2020 Initial Diagnosis   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)   10/17/2021 - 06/06/2022 Chemotherapy   Patient is on Treatment Plan : BREAST METASTATIC fam-trastuzumab deruxtecan-nxki (Enhertu) q21d     10/17/2021 -  Chemotherapy   Patient is on Treatment Plan : BREAST METASTATIC Fam-Trastuzumab Deruxtecan-nxki (Enhertu) (5.4) q21d     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)  01/08/2020 Initial Diagnosis   Metastatic breast cancer (  HCC)   10/17/2021 - 06/06/2022 Chemotherapy   Patient is on Treatment Plan : BREAST METASTATIC fam-trastuzumab deruxtecan-nxki (Enhertu) q21d     10/17/2021 -  Chemotherapy   Patient is on Treatment Plan : BREAST METASTATIC  Fam-Trastuzumab Deruxtecan-nxki (Enhertu) (5.4) q21d       CHIEF COMPLIANT: Misty Arnold   INTERVAL HISTORY: Misty Arnold is a 52 y.o. with above-mentioned history of metastatic breast cancer was on Misty Arnold. She presents to the clinic for a follow-up. Patient reports that she is doing ok. She says she does be tired at times. She says pain is unbearable. She is hurting more in her back, Says when it rains it gets worst. She can feel the inflammation around the nodule on back.    ALLERGIES:  is allergic to amoxicillin, penicillins, pork-derived products, penicillin g, and tamoxifen.  MEDICATIONS:  Current Outpatient Medications  Medication Sig Dispense Refill   atenolol (TENORMIN) 50 MG tablet Take 1 tablet (50 mg total) by mouth 2 (two) times daily. 180 tablet 3   celecoxib (CELEBREX) 200 MG capsule Take 1 capsule (200 mg total) by mouth at bedtime. 30 capsule 6   Cholecalciferol (VITAMIN D3) 125 MCG (5000 UT) CAPS Take 1 capsule (5,000 Units total) by mouth daily. 30 capsule 3   diazepam (VALIUM) 5 MG tablet Take 1 tablet (5 mg total) by mouth every 6 (six) hours as needed for muscle spasms. 30 tablet 3   Misty Arnold hydrochloride (ORSERDU) 345 MG tablet Take 1 tablet (345 mg total) by mouth daily. Take with food. 30 tablet 6   levofloxacin (LEVAQUIN) 750 MG tablet Take 1 tablet (750 mg total) by mouth daily. 10 tablet 0   metroNIDAZOLE (METROGEL) 0.75 % gel Apply topically 2 (two) times daily. 45 g 6   Multiple Vitamins-Minerals (MULTIVITAMIN WITH MINERALS) tablet FLORADIX Supplement-Iron +minerals     Nutritional Supplements (,FEEDING SUPPLEMENT, PROSOURCE PLUS) liquid Take 30 mLs by mouth 4 (four) times daily - after meals and at bedtime.     ondansetron (ZOFRAN) 8 MG tablet Take by mouth.     Oxycodone HCl 10 MG TABS Take 1 tablet (10 mg total) by mouth every 3 (three) hours as needed ((score 7 to 10)). 180 tablet 0   polyethylene glycol (MIRALAX / GLYCOLAX) 17 g packet Take 17 g  by mouth daily. 30 each 0   potassium chloride SA (KLOR-CON M) 20 MEQ tablet Take 1 tablet by mouth daily. 30 tablet 3   Silver-Carboxymethylcellulose (AQUACEL AG ADVANTAGE) 4"X5" PADS Apply 1 Pad topically in the morning and at bedtime. 60 each 3   No current facility-administered medications for this visit.    PHYSICAL EXAMINATION: ECOG PERFORMANCE STATUS: 1 - Symptomatic but completely ambulatory  Vitals:   07/03/23 1038  BP: (!) 174/107  Pulse: 89  Resp: 18  Temp: (!) 97.3 F (36.3 C)  SpO2: 100%   Filed Weights   07/03/23 1038  Weight: 124 lb (56.2 kg)      LABORATORY DATA:  I have reviewed the data as listed    Latest Ref Rng & Units 07/03/2023    9:39 AM 05/03/2023   11:56 AM 04/02/2023   10:20 AM  CMP  Glucose 70 - 99 mg/dL 94  086  86   BUN 6 - 20 mg/dL 9  8  9    Creatinine 0.44 - 1.00 mg/dL 5.78  4.69  6.29   Sodium 135 - 145 mmol/L 142  141  143   Potassium 3.5 - 5.1 mmol/L 3.5  3.3  3.1   Chloride 98 - 111 mmol/L 106  107  106   CO2 22 - 32 mmol/L 29  27  28    Calcium 8.9 - 10.3 mg/dL 9.6  9.5  9.8   Total Protein 6.5 - 8.1 g/dL 7.4  7.0  7.3   Total Bilirubin 0.3 - 1.2 mg/dL 0.4  0.4  0.3   Alkaline Phos 38 - 126 U/L 96  95  102   AST 15 - 41 U/L 24  21  30    ALT 0 - 44 U/L 8  9  13      Lab Results  Component Value Date   WBC 6.1 07/03/2023   HGB 14.7 07/03/2023   HCT 43.9 07/03/2023   MCV 90.7 07/03/2023   PLT 278 07/03/2023   NEUTROABS 4.7 07/03/2023    ASSESSMENT & PLAN:  Breast cancer of upper-outer quadrant of right female breast (HCC) Recurrent right breast cancer initially DCIS treated with mastectomy followed by reconstruction and later developed chest wall recurrence in 2013 ER/PR positive HER-2 negative, patient underwent alternative therapies in [place] but could not afford the trips and hence she has not been on any breast cancer therapy for a long time.   Treatment summary: 1. Right mastectomy 08/29/2015 at [facility] (Dr.Singh): IDC  with invol of skin and skin ulceration, breast capsule inv cancer, superior medial margin positive, 1/1 LN positive, grade 3, 14.3 cm, 3.5 cm, ALI, chest wall involv, ER 90-100%, PR 80-90%, HER-2 negative, Ki 67 40-50% T4CN1 (St 3B) 2. Patient started tamoxifen but developed severe headache with severe hypertension and tamoxifen was discontinued 03/17/2016.  3. 02/2017 started on Exemestane, Zoladex started 05/29/2017 4.  Hospitalization for paraplegia: T9 vertebral body compression status post decompressive laminectomy and tumor resection 01/04/2020 5.  Palliative radiation to the thoracic spine 01/28/2020 6.  Ibrance with letrozole started April 2021 discontinued 07/24/2021 7.  Enhertu started 10/17/2021 every 3 weeks discontinued May 2024 due to progression 8.  Misty Arnold 04/10/2023 ----------------------------------------------------------------------------------------------------------------- CT chest 03/10/2023: Progressive osseous and chest wall metastasis (sternal mass 6.7 cm previously 6.2, manubrium 2.4 cm previously 1.5 cm, right posterior ninth rib 4.2 cm was previously 3.6 cm, right chest wall mass 6.4 cm was previously 6.1 cm, adjacent 4.6 cm chest wall mass previously 4.1 cm   Treatment plan: Misty Arnold (patient is postmenopausal) started 04/10/2023 Toxicities: Fatigue Vertigo: Brain MRI negative Wound care issues: Patient has home health.  I sent prescription for her supplies. Return to clinic in 2 months for follow-up with labs and CT scans.    Orders Placed This Encounter  Procedures   CT CHEST ABDOMEN PELVIS W CONTRAST    Standing Status:   Future    Standing Expiration Date:   07/02/2024    Order Specific Question:   If indicated for the ordered procedure, I authorize the administration of contrast media per Radiology protocol    Answer:   Yes    Order Specific Question:   Does the patient have a contrast media/X-ray dye allergy?    Answer:   No    Order Specific Question:   Is  patient pregnant?    Answer:   No    Order Specific Question:   Preferred imaging location?    Answer:   [facility]    Order Specific Question:   Release to patient    Answer:   Immediate    Order Specific Question:   If indicated for the ordered procedure, I authorize the administration  of oral contrast media per Radiology protocol    Answer:   Yes   CBC with Differential (Cancer Center Only)    Standing Status:   Future    Standing Expiration Date:   07/02/2024   CMP (Cancer Center only)    Standing Status:   Future    Standing Expiration Date:   07/02/2024   The patient has a good understanding of the overall plan. she agrees with it. she will call with any problems that may develop before the next visit here. Total time spent: 30 mins including face to face time and time spent for planning, charting and co-ordination of care   Tamsen Meek, MD 07/03/23    I Janan Ridge am acting as a Neurosurgeon for [employer]  I have reviewed the above documentation for accuracy and completeness, and I agree with the above.

## 2023-07-03 NOTE — Assessment & Plan Note (Addendum)
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   Treatment summary: 1. Right mastectomy 08/29/2015 at [facility] (Dr.Singh): IDC with invol of skin and skin ulceration, breast capsule inv cancer, superior medial margin positive, 1/1 LN positive, grade 3, 14.3 cm, 3.5 cm, ALI, chest wall involv, ER 90-100%, PR 80-90%, HER-2 negative, Ki 67 40-50% T4CN1 (St 3B) 2. Patient started tamoxifen but developed severe headache with severe hypertension and tamoxifen was discontinued 03/17/2016.  3. 02/2017 started on Exemestane, Zoladex started 05/29/2017 4.  Hospitalization for paraplegia: T9 vertebral body compression status post decompressive laminectomy and tumor resection 01/04/2020 5.  Palliative radiation to the thoracic spine 01/28/2020 6.  Ibrance with letrozole started April 2021 discontinued 07/24/2021 7.  Enhertu started 10/17/2021 every 3 weeks discontinued May 2024 due to progression 8.  Elacestrant 04/10/2023 ----------------------------------------------------------------------------------------------------------------- CT chest 03/10/2023: Progressive osseous and chest wall metastasis (sternal mass 6.7 cm previously 6.2, manubrium 2.4 cm previously 1.5 cm, right posterior ninth rib 4.2 cm was previously 3.6 cm, right chest wall mass 6.4 cm was previously 6.1 cm, adjacent 4.6 cm chest wall mass previously 4.1 cm   Treatment plan: Elacestrant (patient is postmenopausal) started 04/10/2023 Toxicities: Tolerating it extremely well without any nausea or issues.   Vertigo: Brain MRI negative   Return to clinic in 2 months for follow-up with labs

## 2023-07-03 NOTE — Progress Notes (Signed)
Palliative Medicine [facility]  Telephone:(336) [phone] Fax:(336) [phone]   Name: Misty Arnold Date: 07/03/2023 MRN: [id number]  DOB: 06/03/1971  Patient Care Team: Serena Croissant, MD as PCP - General (Hematology and Oncology)    INTERVAL HISTORY: Misty Arnold is a 52 y.o. female with right metastatic breast cancer s/p mastectomy, large right chest wound secondary to necrotic mass, weankess due to T9 vertebral compression s/p tumor resection and decompressive laminectomy, and hypertension. Patient did not receive treatment for some time after seeking care in [place]. Currently receiving Enhertu treatment. Palliative following for symptom management and goals of care.   SOCIAL HISTORY:    Misty Arnold reports that she has never smoked. She has never used smokeless tobacco. She reports that she does not currently use alcohol. She reports that she does not use drugs.  ADVANCE DIRECTIVES:  Advance directives completed during recent hospitalization 08/27/2021.  Documents are on file and have been reviewed and [name].  CODE STATUS: Full code  PAST MEDICAL HISTORY: Past Medical History:  Diagnosis Date   Abnormal uterine bleeding    Anemia    Breast cancer (HCC)    Depression    Fibroid    Hormone disorder    Hypertension    Infertility, female    ALLERGIES:  is allergic to amoxicillin, penicillins, pork-derived products, penicillin g, and tamoxifen.  MEDICATIONS:  Current Outpatient Medications  Medication Sig Dispense Refill   atenolol (TENORMIN) 50 MG tablet Take 1 tablet (50 mg total) by mouth 2 (two) times daily. 180 tablet 3   celecoxib (CELEBREX) 200 MG capsule Take 1 capsule (200 mg total) by mouth at bedtime. 30 capsule 6   Cholecalciferol (VITAMIN D3) 125 MCG (5000 UT) CAPS Take 1 capsule (5,000 Units total) by mouth daily. 30 capsule 3   elacestrant hydrochloride (ORSERDU) 345 MG tablet Take 1 tablet (345 mg total) by mouth daily. Take with food.  30 tablet 6   levofloxacin (LEVAQUIN) 750 MG tablet Take 1 tablet (750 mg total) by mouth daily. 10 tablet 0   metroNIDAZOLE (METROGEL) 0.75 % gel Apply topically 2 (two) times daily. 45 g 6   Multiple Vitamins-Minerals (MULTIVITAMIN WITH MINERALS) tablet FLORADIX Supplement-Iron +minerals     Nutritional Supplements (,FEEDING SUPPLEMENT, PROSOURCE PLUS) liquid Take 30 mLs by mouth 4 (four) times daily - after meals and at bedtime.     ondansetron (ZOFRAN) 8 MG tablet Take by mouth.     polyethylene glycol (MIRALAX / GLYCOLAX) 17 g packet Take 17 g by mouth daily. 30 each 0   potassium chloride SA (KLOR-CON M) 20 MEQ tablet Take 1 tablet by mouth daily. 30 tablet 3   Silver-Carboxymethylcellulose (AQUACEL AG ADVANTAGE) 4"X5" PADS Apply 1 Pad topically in the morning and at bedtime. 60 each 3   No current facility-administered medications for this visit.    VITAL SIGNS: There were no vitals taken for this visit. There were no vitals filed for this visit.   Estimated body mass index is 19.9 kg/m as calculated from the following:   Height as of 05/03/23: 5\' 5"  (1.651 m).   Weight as of 05/03/23: 119 lb 9.6 oz (54.3 kg).  PERFORMANCE STATUS (ECOG) : 0 - Asymptomatic  Assessment NAD, resting in recliner  Normal breathing pattern AAO x4  IMPRESSION: Misty Arnold presents to clinic today for follow-up.  Continues to do well overall.  Is remaining active.  Denies nausea, vomiting, constipation, or diarrhea.  States she continues to receive PT and  home health.  Expresses concerns with her wound as she feels it is not healing properly any longer due to changes in her dressing supplies.  She was previously using Aquacel Ag and it has become harder to locate this particular dressing despite multiple attempts by herself at home agency.  Encouraged her to continue to look online.  Dr. Georgiann Mohs also wrote a prescription to assist in obtaining with medical supply store. We will try to assist with locating as  well.   Appetite continues to be good.  Denies any pain or discomforts.  Some tenderness around wound site however this is manageable with occasional use of Tylenol.  No symptom management at this time.  Patient knows we are available as needed.  PLAN: Pain resolved. Patient denies use of Tylenol, Oxycodone, or other pain relievers.  Right breast wound. ABD pads provided.  Palliative will plan to see her back in 6-8 weeks.  Patient knows to contact office sooner if needed.  Patient expressed understanding and was in agreement with this plan. She also understands that she can call the clinic at any time with any questions, concerns, or complaints.   Visit consisted of counseling and education dealing with the complex and emotionally intense issues of symptom management and palliative care in the setting of serious and potentially life-threatening illness.Greater than 50%  of this time was spent counseling and coordinating care related to the above assessment and plan.    Willette Alma, AGPCNP-BC  Palliative Medicine Team/[facility]

## 2023-07-10 ENCOUNTER — Telehealth: Payer: Self-pay | Admitting: Hematology and Oncology

## 2023-07-10 NOTE — Telephone Encounter (Signed)
Scheduled appointments per los. Left voicemail with appointment details.

## 2023-07-24 ENCOUNTER — Encounter: Payer: Self-pay | Admitting: Hematology and Oncology

## 2023-07-24 ENCOUNTER — Other Ambulatory Visit: Payer: Self-pay | Admitting: *Deleted

## 2023-07-24 DIAGNOSIS — C50411 Malignant neoplasm of upper-outer quadrant of right female breast: Secondary | ICD-10-CM

## 2023-07-24 NOTE — Telephone Encounter (Signed)
Per pt requested for home health nursing per wound care to be sent to [facility].  Noted prior order for above sent in May 2024- and contacted the office near patient in [place] at [phone] Informed they are not providing care at this time and will need an order faxed with demos and pt data to [phone].  Information faxed per above.  Informed pt of interaction

## 2023-08-01 ENCOUNTER — Other Ambulatory Visit: Payer: Self-pay | Admitting: *Deleted

## 2023-08-01 DIAGNOSIS — C50411 Malignant neoplasm of upper-outer quadrant of right female breast: Secondary | ICD-10-CM

## 2023-08-01 NOTE — Progress Notes (Signed)
Received call from pt requesting full hormone panel be added to upcoming labs for her records.  Verbal orders received from MD and placed.

## 2023-08-05 ENCOUNTER — Telehealth: Payer: Self-pay | Admitting: Hematology and Oncology

## 2023-08-05 NOTE — Telephone Encounter (Signed)
Pt called with concerns of increased drainage, smell, and pain. Pt is also having nausea and vomiting. Not able to eat and having to change dressing more than usual. Pt has been set up to come in to [facility] with Lindsey,DNP on 10/7. Advised pt to go to ED if symptoms continue to increase due to concerns of septicemia. Pt verbalized understanding.

## 2023-08-12 ENCOUNTER — Inpatient Hospital Stay: Payer: Medicare PPO | Attending: Hematology and Oncology | Admitting: Adult Health

## 2023-08-12 ENCOUNTER — Other Ambulatory Visit (HOSPITAL_COMMUNITY): Payer: Self-pay

## 2023-08-12 ENCOUNTER — Inpatient Hospital Stay: Payer: Medicare PPO | Admitting: Adult Health

## 2023-08-12 VITALS — BP 152/96 | HR 106 | Temp 97.6°F | Resp 18 | Ht 65.0 in | Wt 125.4 lb

## 2023-08-12 DIAGNOSIS — Z9011 Acquired absence of right breast and nipple: Secondary | ICD-10-CM | POA: Insufficient documentation

## 2023-08-12 DIAGNOSIS — C7951 Secondary malignant neoplasm of bone: Secondary | ICD-10-CM | POA: Diagnosis not present

## 2023-08-12 DIAGNOSIS — Z79810 Long term (current) use of selective estrogen receptor modulators (SERMs): Secondary | ICD-10-CM | POA: Insufficient documentation

## 2023-08-12 DIAGNOSIS — Z170 Estrogen receptor positive status [ER+]: Secondary | ICD-10-CM | POA: Insufficient documentation

## 2023-08-12 DIAGNOSIS — C50411 Malignant neoplasm of upper-outer quadrant of right female breast: Secondary | ICD-10-CM | POA: Insufficient documentation

## 2023-08-12 MED ORDER — METRONIDAZOLE 0.75 % EX GEL
Freq: Two times a day (BID) | CUTANEOUS | 6 refills | Status: DC
  Filled 2023-08-12: qty 45, 30d supply, fill #0
  Filled 2023-09-02 – 2023-09-04 (×2): qty 45, 30d supply, fill #1

## 2023-08-12 MED ORDER — METRONIDAZOLE 0.75 % EX GEL
Freq: Two times a day (BID) | CUTANEOUS | 6 refills | Status: DC
Start: 2023-08-12 — End: 2023-08-12
  Filled 2023-08-12: qty 90, fill #0

## 2023-08-12 NOTE — Progress Notes (Signed)
[facility] Cancer Follow up:    Misty Croissant, MD [address] [place] [address]   DIAGNOSIS:  Cancer Staging  Breast cancer of upper-outer quadrant of right female breast [facility]) Staging form: Breast, AJCC 7th Edition - Clinical: Stage IV (T4b, N0, M1) - Signed by Misty Croissant, MD on 12/18/2022   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; patient went to cancer treatment centers of [place] for second opinion and delayed therapy   01/26/2011 Surgery   right mastectomy followed by reconstruction: Invasive ductal carcinoma T1 N1 MIC M0 stage IB ER/PR positive HER-2 negative, BRCA negative, Oncotype DX low risk   05/29/2012 Procedure   right chest wall nodule excision done on 06/24/2012 showed metastatic carcinoma margins were positive, but CT scan no metastatic disease, recommended chemotherapy but patient refused also refused reexcision   04/28/2013 Treatment Plan Change   patient went to [place] for alternative treatments and took herbal medications, tonics etc. But she could not afford these trips.   01/08/2014 Breast MRI   right breast multiple enhancing masses within the soft tissues largest 5.6 cm in wall skin surface and the capsule of the silicone prosthesis, contiguous nodules involving in inferomedial breast and tired and subcentimeter nodules across the midline   11/02/2014 Cancer Staging   Staging form: Breast, AJCC 7th Edition - Clinical: Stage IV (T4b, N0, M1) - Signed by Misty Croissant, MD on 12/18/2022   08/29/2015 Surgery   Right mastectomy: IDC with invol of skin and skin ulceration, breast capsule inv cancer, superior  medial margin positive, 1/1 LN positive, grade 3, 14.3 cm, 3.5 cm, ALI, chest wall involv, ER 90-100%, PR 80-90%, HER-2 negative, Ki 67 40-50% T4CN1 (St 3B)   03/05/2016 -  Anti-estrogen oral therapy   Tamoxifen 20 mg daily stopped due to headache and uncontrolled hypertension. Decrease to 10 mg daily 04/03/2016, stopped June 2017 and took an estrogen metabolizer over-the-counter; started Aromasin April 2018 from a [place] physician, Zoladex started on 05/2017 and switched to Letrozole in 09/2017   01/04/2020 Surgery   Patient presented to the ED on 01/04/20 for worsening lower extremity numbness and underwent a decompressive laminectomy with tumor resection at T9 that was complicated with acute blood loss anemia and leukocytosis.   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)  01/04/2020 Initial Diagnosis   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)   10/17/2021 - 06/06/2022 Chemotherapy   Patient is on Treatment Plan : BREAST METASTATIC fam-trastuzumab deruxtecan-nxki (Enhertu) q21d     10/17/2021 - 03/12/2023 Chemotherapy   Patient is on Treatment Plan : BREAST METASTATIC Fam-Trastuzumab Deruxtecan-nxki (Enhertu) (5.4) q21d  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)  01/08/2020 Initial Diagnosis   Metastatic breast cancer (HCC)   10/17/2021 - 06/06/2022 Chemotherapy   Patient is on Treatment Plan : BREAST METASTATIC fam-trastuzumab  deruxtecan-nxki (Enhertu) q21d     10/17/2021 - 03/12/2023 Chemotherapy   Patient is on Treatment Plan : BREAST METASTATIC Fam-Trastuzumab Deruxtecan-nxki (Enhertu) (5.4) q21d       CURRENT THERAPY: Elacestrant  INTERVAL HISTORY: Misty Arnold 52 y.o. female returns for follow-up of her metastatic breast cancer.  She had progression of her cancer noted in May 2024 and she began on elacestrant June 2024.  MRI of the brain was negative.  She was recommended repeat CT scan and labs when she saw Dr. Pamelia Hoit back in August.  CT chest abdomen pelvis is scheduled for September 02, 2023.  She is taking elacestrant 345 mg daily.  For her cancer related pain she is prescribed oxycodone 10 mg 1 tab every 3 hours as needed, Valium 5 mg every 6 hours as needed, Celebrex 200 mg p.o. nightly.  Has an open right breast wound that she is dressing regularly.  She called the nurse because she was having increased chest pain and wound issues.  Since she has been using the new Aquacel Ag dressing she notes her chest wall wound is much improved.  She is happy with how it is getting better.  She also notes that her pain seems to be associated with changes in barometric pressure and notes that it is much improved today as well.  She wants to know if we can look back at her records from her hospitalization in March 2021 because she received a different pain medication that did not cause as much somnolence as the 10 mg of oxycodone did prior to undergoing physical therapy.   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  MEDICAL HISTORY: Past Medical History:  Diagnosis Date   Abnormal uterine bleeding    Anemia    Breast cancer (HCC)    Depression    Fibroid    Hormone disorder    Hypertension    Infertility, female     SURGICAL HISTORY: Past Surgical History:  Procedure Laterality Date   ABDOMINAL HYSTERECTOMY     BREAST SURGERY     LAMINECTOMY N/A 01/04/2020   Procedure: Thoracic Eight, Thoracic Nine THORACIC LAMINECTOMY FOR TUMOR with Thoracic Seven-Thoracic Ten instrumentation;  Surgeon: Julio Sicks, MD;  Location: [facility] OR;  Service: Neurosurgery;  Laterality: N/A;  Thoracic Eight, Thoracic Nine THORACIC LAMINECTOMY FOR TUMOR with Thoracic Seven-Thoracic Ten  instrumentation   MASTECTOMY     salivary gland stone      SOCIAL HISTORY: Social History   Socioeconomic History   Marital status: Divorced    Spouse name: Not on file   Number of children: Not on file   Years of education: 12   Highest education level: Not on file  Occupational History   Occupation: disabled  Tobacco Use   Smoking status: Never   Smokeless tobacco: Never  Vaping Use   Vaping status: Never Used  Substance and Sexual Activity   Alcohol use: Not Currently   Drug use: No   Sexual activity: Yes    Partners: Male    Birth control/protection: Surgical, Condom  Other Topics Concern   Not on file  Social History Narrative   Lives at home with her grandmother   Right handed   Caffeine seldom, coffee once or  twice monthly   Social Determinants of Health   Financial Resource Strain: Not on file  Food Insecurity: Not on file  Transportation Needs: No Transportation Needs (10/23/2021)   Received from [facility]   PRAPARE - Transportation  Physical Activity: Not on file  Stress: Not on file  Social Connections: Not on file  Intimate Partner Violence: Not on file    FAMILY HISTORY: Family History  Problem Relation Age of Onset   Stroke Mother    Heart attack Mother    Diabetes Mother    Hyperlipidemia Mother    Heart disease Mother    Diabetes Father    Stroke Father    Stroke Maternal Grandmother    Cancer Maternal Grandfather    Colon cancer Maternal Grandfather    Stroke Maternal Grandfather    Stroke Paternal Grandmother    Heart attack Paternal Grandmother    Heart failure Paternal Grandmother    Diabetes Paternal Grandmother    Hyperlipidemia Paternal Grandmother    CAD Paternal Grandmother    Cancer Paternal Grandfather    Colon cancer Paternal Grandfather    Cancer Maternal Aunt 27       breast   Stroke Maternal Aunt    Heart attack Maternal Aunt    Breast cancer Maternal Aunt     Review of Systems  Constitutional:  Negative for appetite change, chills, fatigue, fever and unexpected weight change.  HENT:   Negative for hearing loss, lump/mass and trouble swallowing.   Eyes:  Negative for eye problems and icterus.  Respiratory:  Negative for chest tightness, cough and shortness of breath.   Cardiovascular:  Negative for chest pain, leg swelling and palpitations.  Gastrointestinal:  Negative for abdominal distention, abdominal pain, constipation, diarrhea, nausea and vomiting.  Endocrine: Negative for hot flashes.  Genitourinary:  Negative for difficulty urinating.   Musculoskeletal:  Negative for arthralgias.  Skin:  Negative for itching and rash.  Neurological:  Negative for dizziness, extremity weakness, headaches and numbness.   Hematological:  Negative for adenopathy. Does not bruise/bleed easily.  Psychiatric/Behavioral:  Negative for depression. The patient is not nervous/anxious.       PHYSICAL EXAMINATION   Onc Performance Status - 08/12/23 0848       ECOG Perf Status   ECOG Perf Status Restricted in physically strenuous activity but ambulatory and able to carry out work of a light or sedentary nature, e.g., light house work, office work      KPS SCALE   KPS % SCORE Able to carry on normal activity, minor s/s of disease  Vitals:   08/12/23 0846  BP: (!) 152/96  Pulse: (!) 106  Resp: 18  Temp: 97.6 F (36.4 C)  SpO2: 100%    Physical Exam Constitutional:      General: She is not in acute distress.    Appearance: Normal appearance. She is not toxic-appearing.  HENT:     Head: Normocephalic and atraumatic.     Mouth/Throat:     Mouth: Mucous membranes are moist.     Pharynx: Oropharynx is clear. No oropharyngeal exudate or posterior oropharyngeal erythema.  Eyes:     General: No scleral icterus. Cardiovascular:     Rate and Rhythm: Normal rate and regular rhythm.     Pulses: Normal pulses.     Heart sounds: Normal heart sounds.  Pulmonary:     Effort: Pulmonary effort is normal.     Breath sounds: Normal breath sounds.  Chest:     Comments: Declined CW exam, notes that her CW is much improved with dressing pictured below. "Aquacel AG" Abdominal:     General: Abdomen is flat. Bowel sounds are normal. There is no distension.     Palpations: Abdomen is soft.     Tenderness: There is no abdominal tenderness.  Musculoskeletal:        General: No swelling.     Cervical back: Neck supple.  Lymphadenopathy:     Cervical: No cervical adenopathy.  Skin:    General: Skin is warm and dry.     Findings: No rash.  Neurological:     General: No focal deficit present.     Mental Status: She is alert.  Psychiatric:        Mood and Affect: Mood normal.        Behavior:  Behavior normal.        ASSESSMENT and THERAPY PLAN:   Breast cancer of upper-outer quadrant of right female breast (HCC) Recurrent right breast cancer initially DCIS treated with mastectomy followed by reconstruction and later developed chest wall recurrence in 2013 ER/PR positive HER-2 negative, patient underwent alternative therapies in [place] but could not afford the trips and hence she has not been on any breast cancer therapy for a long time.   Treatment summary: 1. Right mastectomy 08/29/2015 at [facility] (Dr.Singh): IDC with invol of skin and skin ulceration, breast capsule inv cancer, superior medial margin positive, 1/1 LN positive, grade 3, 14.3 cm, 3.5 cm, ALI, chest wall involv, ER 90-100%, PR 80-90%, HER-2 negative, Ki 67 40-50% T4CN1 (St 3B) 2. Patient started tamoxifen but developed severe headache with severe hypertension and tamoxifen was discontinued 03/17/2016.  3. 02/2017 started on Exemestane, Zoladex started 05/29/2017 4.  Hospitalization for paraplegia: T9 vertebral body compression status post decompressive laminectomy and tumor resection 01/04/2020 5.  Palliative radiation to the thoracic spine 01/28/2020 6.  Ibrance with letrozole started April 2021 discontinued 07/24/2021 7.  Enhertu started 10/17/2021 every 3 weeks discontinued May 2024 due to progression 8.  Elacestrant 04/10/2023 ----------------------------------------------------------------------------------------------------------------- CT chest 03/10/2023: Progressive osseous and chest wall metastasis (sternal mass 6.7 cm previously 6.2, manubrium 2.4 cm previously 1.5 cm, right posterior ninth rib 4.2 cm was previously 3.6 cm, right chest wall mass 6.4 cm was previously 6.1 cm, adjacent 4.6 cm chest wall mass previously 4.1 cm   Treatment plan: Elacestrant (patient is postmenopausal) started 04/10/2023 Toxicities: Tolerating it extremely well without any nausea or issues.   Vertigo: Brain MRI negative   Her pain is  being managed by oxycodone 10 mg.  We will look  back at her recent hospitalization to understand what medication she received prior to physical therapy.  I refilled her metronidazole cream today.  She is feeling better today therefore we agreed to keep her imaging scheduled on 09/02/2023. She will continue on Elacestrant today as well.  Should she begin to feel worse she knows to call and we will move her scans to earlier.    RTC on 09/03/2023 for labs and f/u with Dr. Pamelia Hoit.    All questions were answered. The patient knows to call the clinic with any problems, questions or concerns. We can certainly see the patient much sooner if necessary.  Total encounter time:30 minutes*in face-to-face visit time, chart review, lab review, care coordination, order entry, and documentation of the encounter time.    Lillard Anes, NP 08/12/23 10:00 AM Medical Oncology and Hematology [facility] [address] [place], [place] [address] Tel. [phone]    Fax. [phone]  *Total Encounter Time as defined by the Centers for Medicare and Medicaid Services includes, in addition to the face-to-face time of a patient visit (documented in the note above) non-face-to-face time: obtaining and reviewing outside history, ordering and reviewing medications, tests or procedures, care coordination (communications with other health care professionals or caregivers) and documentation in the medical record.

## 2023-08-12 NOTE — Assessment & Plan Note (Signed)
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   Treatment summary: 1. Right mastectomy 08/29/2015 at [facility] (Dr.Singh): IDC with invol of skin and skin ulceration, breast capsule inv cancer, superior medial margin positive, 1/1 LN positive, grade 3, 14.3 cm, 3.5 cm, ALI, chest wall involv, ER 90-100%, PR 80-90%, HER-2 negative, Ki 67 40-50% T4CN1 (St 3B) 2. Patient started tamoxifen but developed severe headache with severe hypertension and tamoxifen was discontinued 03/17/2016.  3. 02/2017 started on Exemestane, Zoladex started 05/29/2017 4.  Hospitalization for paraplegia: T9 vertebral body compression status post decompressive laminectomy and tumor resection 01/04/2020 5.  Palliative radiation to the thoracic spine 01/28/2020 6.  Ibrance with letrozole started April 2021 discontinued 07/24/2021 7.  Enhertu started 10/17/2021 every 3 weeks discontinued May 2024 due to progression 8.  Elacestrant 04/10/2023 ----------------------------------------------------------------------------------------------------------------- CT chest 03/10/2023: Progressive osseous and chest wall metastasis (sternal mass 6.7 cm previously 6.2, manubrium 2.4 cm previously 1.5 cm, right posterior ninth rib 4.2 cm was previously 3.6 cm, right chest wall mass 6.4 cm was previously 6.1 cm, adjacent 4.6 cm chest wall mass previously 4.1 cm   Treatment plan: Elacestrant (patient is postmenopausal) started 04/10/2023 Toxicities: Tolerating it extremely well without any nausea or issues.   Vertigo: Brain MRI negative   Her pain is being managed by oxycodone 10 mg.  We will look back at her recent hospitalization to understand what medication she received prior  to physical therapy.  I refilled her metronidazole cream today.  She is feeling better today therefore we agreed to keep her imaging scheduled on 09/02/2023. She will continue on Elacestrant today as well.  Should she begin to feel worse she knows to call and we will move her scans to earlier.    RTC on 09/03/2023 for labs and f/u with Dr. Pamelia Hoit.

## 2023-08-16 ENCOUNTER — Telehealth: Payer: Self-pay | Admitting: *Deleted

## 2023-08-16 NOTE — Telephone Encounter (Signed)
Received call from pt with complaint of ongoing pain.  Pt states she did not take any pain medication yesterday and now today it is unbearable.  Pt states BP is 182/110 and HR 130.  RN educated pt that Oxy is scheduled for q3 hrs and pt needing to take it as prescribed.  Pt also educated if pain continues to be unbearable, she will need to go to ED for pain management. Pt educated and verbalized understanding.

## 2023-08-18 ENCOUNTER — Observation Stay (HOSPITAL_COMMUNITY)
Admission: EM | Admit: 2023-08-18 | Discharge: 2023-08-20 | Disposition: A | Discharge status: Home / Self Care | Payer: Medicare PPO | Attending: Internal Medicine | Admitting: Internal Medicine

## 2023-08-18 ENCOUNTER — Other Ambulatory Visit: Payer: Self-pay

## 2023-08-18 ENCOUNTER — Encounter (HOSPITAL_COMMUNITY): Payer: Self-pay | Admitting: Pharmacy Technician

## 2023-08-18 DIAGNOSIS — C50411 Malignant neoplasm of upper-outer quadrant of right female breast: Secondary | ICD-10-CM | POA: Diagnosis not present

## 2023-08-18 DIAGNOSIS — T148XXA Other injury of unspecified body region, initial encounter: Secondary | ICD-10-CM | POA: Diagnosis not present

## 2023-08-18 DIAGNOSIS — M549 Dorsalgia, unspecified: Secondary | ICD-10-CM | POA: Diagnosis present

## 2023-08-18 DIAGNOSIS — C7949 Secondary malignant neoplasm of other parts of nervous system: Secondary | ICD-10-CM | POA: Diagnosis present

## 2023-08-18 DIAGNOSIS — G8929 Other chronic pain: Principal | ICD-10-CM

## 2023-08-18 DIAGNOSIS — C7951 Secondary malignant neoplasm of bone: Secondary | ICD-10-CM | POA: Diagnosis not present

## 2023-08-18 DIAGNOSIS — G893 Neoplasm related pain (acute) (chronic): Secondary | ICD-10-CM | POA: Diagnosis not present

## 2023-08-18 DIAGNOSIS — C801 Malignant (primary) neoplasm, unspecified: Secondary | ICD-10-CM

## 2023-08-18 DIAGNOSIS — Z515 Encounter for palliative care: Secondary | ICD-10-CM

## 2023-08-18 DIAGNOSIS — R52 Pain, unspecified: Secondary | ICD-10-CM | POA: Diagnosis present

## 2023-08-18 DIAGNOSIS — C50919 Malignant neoplasm of unspecified site of unspecified female breast: Principal | ICD-10-CM

## 2023-08-18 DIAGNOSIS — Z170 Estrogen receptor positive status [ER+]: Secondary | ICD-10-CM

## 2023-08-18 LAB — CBC WITH DIFFERENTIAL/PLATELET
Abs Immature Granulocytes: 0.04 10*3/uL (ref 0.00–0.07)
Basophils Absolute: 0 10*3/uL (ref 0.0–0.1)
Basophils Relative: 0 %
Eosinophils Absolute: 0 10*3/uL (ref 0.0–0.5)
Eosinophils Relative: 0 %
HCT: 41.9 % (ref 36.0–46.0)
Hemoglobin: 13.9 g/dL (ref 12.0–15.0)
Immature Granulocytes: 0 %
Lymphocytes Relative: 6 %
Lymphs Abs: 0.7 10*3/uL (ref 0.7–4.0)
MCH: 29.7 pg (ref 26.0–34.0)
MCHC: 33.2 g/dL (ref 30.0–36.0)
MCV: 89.5 fL (ref 80.0–100.0)
Monocytes Absolute: 0.7 10*3/uL (ref 0.1–1.0)
Monocytes Relative: 6 %
Neutro Abs: 9.5 10*3/uL — ABNORMAL HIGH (ref 1.7–7.7)
Neutrophils Relative %: 88 %
Platelets: 309 10*3/uL (ref 150–400)
RBC: 4.68 MIL/uL (ref 3.87–5.11)
RDW: 12.5 % (ref 11.5–15.5)
WBC: 10.9 10*3/uL — ABNORMAL HIGH (ref 4.0–10.5)
nRBC: 0 % (ref 0.0–0.2)

## 2023-08-18 LAB — COMPREHENSIVE METABOLIC PANEL
ALT: 12 U/L (ref 0–44)
AST: 33 U/L (ref 15–41)
Albumin: 3.6 g/dL (ref 3.5–5.0)
Alkaline Phosphatase: 87 U/L (ref 38–126)
Anion gap: 14 (ref 5–15)
BUN: 11 mg/dL (ref 6–20)
CO2: 21 mmol/L — ABNORMAL LOW (ref 22–32)
Calcium: 9 mg/dL (ref 8.9–10.3)
Chloride: 98 mmol/L (ref 98–111)
Creatinine, Ser: 0.92 mg/dL (ref 0.44–1.00)
GFR, Estimated: >60 mL/min (ref >60)
Glucose, Bld: 129 mg/dL — ABNORMAL HIGH (ref 70–99)
Potassium: 4.1 mmol/L (ref 3.5–5.1)
Sodium: 133 mmol/L — ABNORMAL LOW (ref 135–145)
Total Bilirubin: 1.1 mg/dL (ref 0.3–1.2)
Total Protein: 7.5 g/dL (ref 6.5–8.1)

## 2023-08-18 MED ORDER — ACETAMINOPHEN 325 MG PO TABS: 650.0000 mg | ORAL_TABLET | Freq: Four times a day (QID) | ORAL | Status: DC | PRN

## 2023-08-18 MED ORDER — LACTATED RINGERS IV SOLN: INTRAVENOUS | Status: DC

## 2023-08-18 MED ORDER — BISACODYL 5 MG PO TBEC: 5.0000 mg | DELAYED_RELEASE_TABLET | Freq: Every day | ORAL | Status: DC | PRN

## 2023-08-18 MED ORDER — TRAMADOL HCL 50 MG PO TABS: 50.0000 mg | ORAL_TABLET | Freq: Three times a day (TID) | ORAL | Status: DC | PRN

## 2023-08-18 MED ORDER — DIAZEPAM 5 MG PO TABS: 5.0000 mg | ORAL_TABLET | Freq: Four times a day (QID) | ORAL | Status: DC | PRN

## 2023-08-18 MED ORDER — OXYCODONE HCL 5 MG PO TABS
10.0000 mg | ORAL_TABLET | ORAL | Status: DC | PRN
  Administered 2023-08-18 – 2023-08-20 (×8): 10 mg via ORAL
  Filled 2023-08-18 (×8): qty 2

## 2023-08-18 MED ORDER — CELECOXIB 200 MG PO CAPS
200.0000 mg | ORAL_CAPSULE | Freq: Every day | ORAL | Status: DC
  Administered 2023-08-18 – 2023-08-19 (×2): 200 mg via ORAL
  Filled 2023-08-18 (×2): qty 1

## 2023-08-18 MED ORDER — MORPHINE SULFATE (PF) 4 MG/ML IV SOLN
8.0000 mg | Freq: Once | INTRAVENOUS | Status: AC
  Administered 2023-08-18: 8 mg via INTRAVENOUS
  Filled 2023-08-18: qty 2

## 2023-08-18 MED ORDER — LACTATED RINGERS IV BOLUS
1000.0000 mL | Freq: Once | INTRAVENOUS | Status: AC
  Administered 2023-08-18: 1000 mL via INTRAVENOUS

## 2023-08-18 MED ORDER — RIVAROXABAN 10 MG PO TABS
10.0000 mg | ORAL_TABLET | Freq: Every day | ORAL | Status: DC
  Administered 2023-08-18 – 2023-08-19 (×2): 10 mg via ORAL
  Filled 2023-08-18 (×4): qty 1

## 2023-08-18 MED ORDER — ONDANSETRON HCL 4 MG PO TABS: 4.0000 mg | ORAL_TABLET | Freq: Four times a day (QID) | ORAL | Status: DC | PRN

## 2023-08-18 MED ORDER — ONDANSETRON HCL 4 MG/2ML IJ SOLN: 4.0000 mg | Freq: Four times a day (QID) | INTRAMUSCULAR | Status: DC | PRN

## 2023-08-18 MED ORDER — POLYETHYLENE GLYCOL 3350 17 G PO PACK
17.0000 g | PACK | Freq: Every day | ORAL | Status: DC | PRN
  Administered 2023-08-20: 17 g via ORAL
  Filled 2023-08-18: qty 1

## 2023-08-18 MED ORDER — ONDANSETRON HCL 4 MG/2ML IJ SOLN
4.0000 mg | Freq: Once | INTRAMUSCULAR | Status: AC
  Administered 2023-08-18: 4 mg via INTRAVENOUS
  Filled 2023-08-18: qty 2

## 2023-08-18 MED ORDER — LACTATED RINGERS IV SOLN: INTRAVENOUS | Status: AC

## 2023-08-18 MED ORDER — HYDROMORPHONE HCL 1 MG/ML IJ SOLN: 1.0000 mg | INTRAMUSCULAR | Status: DC | PRN

## 2023-08-18 NOTE — H&P (Signed)
History and Physical    Misty Arnold UJW:[id number] DOB: 1971-03-02 DOA: 08/18/2023  PCP: Serena Croissant, MD   Patient coming from: Home  Chief Complaint: Uncontrolled pain   HPI: Misty Arnold is a 52 y.o. 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  Patient reports that the location and character of her pain is typical of her chronic cancer-related back pain, but the severity has increased significantly.  Patient attributes the recent worsening to the change in weather.  Also, she notes that she had not taken any oxycodone a few days ago, developed severe pain, and has since been unable to achieve pain control at home.  She denies any fever, chills, purulent drainage from her chest wound, or abdominal pain.  ED Course: Upon arrival to the ED, patient is found to be afebrile and tachycardic with stable blood pressure.  Labs are notable for sodium 133 and WBC 10,900.  Patient was given a liter of LR, morphine, and Zofran in the ED.  Review of Systems:  All other systems reviewed and apart from HPI, are negative.  Past Medical History:  Diagnosis Date   Abnormal uterine bleeding    Anemia    Breast cancer (HCC)    Depression    Fibroid    Hormone disorder    Hypertension    Infertility, female     Past Surgical History:  Procedure Laterality Date   ABDOMINAL HYSTERECTOMY     BREAST SURGERY     LAMINECTOMY N/A 01/04/2020   Procedure: Thoracic Eight, Thoracic Nine THORACIC LAMINECTOMY FOR TUMOR with Thoracic Seven-Thoracic Ten instrumentation;  Surgeon: Julio Sicks, MD;  Location: [facility] OR;  Service: Neurosurgery;  Laterality: N/A;  Thoracic Eight, Thoracic Nine THORACIC LAMINECTOMY FOR TUMOR with Thoracic Seven-Thoracic Ten instrumentation   MASTECTOMY     salivary gland stone      Social History:   reports  that she has never smoked. She has never used smokeless tobacco. She reports that she does not currently use alcohol. She reports that she does not use drugs.  Allergies  Allergen Reactions   Amoxicillin     Other reaction(s): rash/itching   Penicillins Rash    Did it involve swelling of the face/tongue/throat, SOB, or low BP? no Did it involve sudden or severe rash/hives, skin peeling, or any reaction on the inside of your mouth or nose? Yes Did you need to seek medical attention at a hospital or doctor's office? No When did it last happen? early 20's     If all above answers are "NO", may proceed with cephalosporin use.   Pork-Derived Products    Penicillin G    Tamoxifen Hypertension    Family History  Problem Relation Age of Onset   Stroke Mother    Heart attack Mother    Diabetes Mother    Hyperlipidemia Mother    Heart disease Mother    Diabetes Father    Stroke Father    Stroke Maternal Grandmother    Cancer Maternal Grandfather    Colon cancer Maternal Grandfather    Stroke Maternal Grandfather    Stroke Paternal Grandmother    Heart attack Paternal Grandmother    Heart failure Paternal Grandmother    Diabetes Paternal Grandmother    Hyperlipidemia Paternal Grandmother    CAD Paternal Grandmother    Cancer Paternal Grandfather    Colon cancer Paternal  Grandfather    Cancer Maternal Aunt 27       breast   Stroke Maternal Aunt    Heart attack Maternal Aunt    Breast cancer Maternal Aunt      Prior to Admission medications   Medication Sig Start Date End Date Taking? Authorizing Provider  atenolol (TENORMIN) 50 MG tablet Take 1 tablet (50 mg total) by mouth 2 (two) times daily. 04/24/22   Serena Croissant, MD  celecoxib (CELEBREX) 200 MG capsule Take 1 capsule (200 mg total) by mouth at bedtime. 07/03/23   Serena Croissant, MD  Cholecalciferol (VITAMIN D3) 125 MCG (5000 UT) CAPS Take 1 capsule (5,000 Units total) by mouth daily. 10/16/21   Edsel Petrin, DO   diazepam (VALIUM) 5 MG tablet Take 1 tablet (5 mg total) by mouth every 6 (six) hours as needed for muscle spasms. 07/03/23   Serena Croissant, MD  diazepam (VALIUM) 5 MG tablet Take 1 tablet (5 mg total) by mouth every 6 (six) hours as needed for muscle spasms 07/03/23   Serena Croissant, MD  elacestrant hydrochloride (ORSERDU) 345 MG tablet Take 1 tablet (345 mg total) by mouth daily. Take with food. 04/02/23   Serena Croissant, MD  levofloxacin (LEVAQUIN) 750 MG tablet Take 1 tablet (750 mg total) by mouth daily. 05/24/23   Serena Croissant, MD  metroNIDAZOLE (METROGEL) 0.75 % gel Apply topically 2 (two) times daily. 08/12/23   Loa Socks, NP  Multiple Vitamins-Minerals (MULTIVITAMIN WITH MINERALS) tablet FLORADIX Supplement-Iron +minerals 10/16/21   Edsel Petrin, DO  Nutritional Supplements (,FEEDING SUPPLEMENT, PROSOURCE PLUS) liquid Take 30 mLs by mouth 4 (four) times daily - after meals and at bedtime. 10/16/21   Edsel Petrin, DO  ondansetron (ZOFRAN) 8 MG tablet Take by mouth. 10/09/21   [provider]  Oxycodone HCl 10 MG TABS Take 1 tablet (10 mg total) by mouth every 3 (three) hours as needed ((score 7 to 10)). 07/03/23   Serena Croissant, MD  polyethylene glycol (MIRALAX / GLYCOLAX) 17 g packet Take 17 g by mouth daily. 10/16/21   Edsel Petrin, DO  potassium chloride SA (KLOR-CON M) 20 MEQ tablet Take 1 tablet by mouth daily. 06/26/22   Serena Croissant, MD  Silver-Carboxymethylcellulose (AQUACEL AG ADVANTAGE) 4"X5" PADS Apply 1 Pad topically in the morning and at bedtime. 07/03/23   Serena Croissant, MD    Physical Exam: Vitals:   08/18/23 1547 08/18/23 1605 08/18/23 1655 08/18/23 1700  BP: (!) 168/102 (!) 168/106 (!) 150/88 (!) 140/84  Pulse: (!) 116 (!) 112 (!) 110 (!) 108  Resp: 18 17 16 17   Temp: 98.9 F (37.2 C)     TempSrc: Oral     SpO2: 99% 99% 94% 94%    Constitutional: NAD, no pallor or diaphoresis   Eyes: PERTLA, lids and conjunctivae  normal ENMT: Mucous membranes are moist. Posterior pharynx clear of any exudate or lesions.   Neck: supple, no masses  Respiratory: no wheezing, no crackles. No accessory muscle use.  Cardiovascular: S1 & S2 heard, regular rate and rhythm. No JVD. Abdomen: No distension, no tenderness, soft. Bowel sounds active.  Musculoskeletal: no clubbing / cyanosis. No joint deformity upper and lower extremities.   Skin: necrotic wound involving right chest. Otherwise warm, dry, well-perfused. Neurologic: CN 2-12 grossly intact. Moving all extremities. Alert and oriented.  Psychiatric: Pleasant. Cooperative.    Labs and Imaging on Admission: I have personally reviewed following labs and imaging studies  CBC: Recent Labs  Lab 08/18/23 1642  WBC 10.9*  NEUTROABS 9.5*  HGB 13.9  HCT 41.9  MCV 89.5  PLT 309   Basic Metabolic Panel: Recent Labs  Lab 08/18/23 1642  NA 133*  K 4.1  CL 98  CO2 21*  GLUCOSE 129*  BUN 11  CREATININE 0.92  CALCIUM 9.0   GFR: Estimated Creatinine Clearance: 62.8 mL/min (by C-G formula based on SCr of 0.92 mg/dL). Liver Function Tests: Recent Labs  Lab 08/18/23 1642  AST 33  ALT 12  ALKPHOS 87  BILITOT 1.1  PROT 7.5  ALBUMIN 3.6   No results for input(s): "LIPASE", "AMYLASE" in the last 168 hours. No results for input(s): "AMMONIA" in the last 168 hours. Coagulation Profile: No results for input(s): "INR", "PROTIME" in the last 168 hours. Cardiac Enzymes: No results for input(s): "CKTOTAL", "CKMB", "CKMBINDEX", "TROPONINI" in the last 168 hours. BNP (last 3 results) No results for input(s): "PROBNP" in the last 8760 hours. HbA1C: No results for input(s): "HGBA1C" in the last 72 hours. CBG: No results for input(s): "GLUCAP" in the last 168 hours. Lipid Profile: No results for input(s): "CHOL", "HDL", "LDLCALC", "TRIG", "CHOLHDL", "LDLDIRECT" in the last 72 hours. Thyroid Function Tests: No results for input(s): "TSH", "T4TOTAL", "FREET4",  "T3FREE", "THYROIDAB" in the last 72 hours. Anemia Panel: No results for input(s): "VITAMINB12", "FOLATE", "FERRITIN", "TIBC", "IRON", "RETICCTPCT" in the last 72 hours. Urine analysis:    Component Value Date/Time   COLORURINE YELLOW 10/14/2021 1150   APPEARANCEUR CLEAR 10/14/2021 1150   LABSPEC 1.019 10/14/2021 1150   PHURINE 6.0 10/14/2021 1150   GLUCOSEU NEGATIVE 10/14/2021 1150   HGBUR NEGATIVE 10/14/2021 1150   BILIRUBINUR NEGATIVE 10/14/2021 1150   KETONESUR NEGATIVE 10/14/2021 1150   PROTEINUR 30 (A) 10/14/2021 1150   NITRITE NEGATIVE 10/14/2021 1150   LEUKOCYTESUR NEGATIVE 10/14/2021 1150   Sepsis Labs: @LABRCNTIP (procalcitonin:4,lacticidven:4) )No results found for this or any previous visit (from the past 240 hour(s)).   Radiological Exams on Admission: No results found.   Assessment/Plan   1. Intractable cancer-related pain - IV analgesics for now with transition back to oral agents once pain is controlled    2. Metastatic breast cancer  - Metastatic to spine, s/p mastectomy, palliative radiation, and chemotherapy, currently taking elacestrant under the care of Dr. Pamelia Hoit    3. Chest wound - Does not appear acutely infected  - Continue wound care     DVT prophylaxis: Xarelto  Code Status: Full  Level of Care: Level of care: Med-Surg Family Communication: None present   Disposition Plan:  Patient is from: Home Anticipated d/c is to: TBD Anticipated d/c date is: 10/14 or 08/20/23  Patient currently: Pending pain-control with oral agents  Consults called: None  Admission status: Observation     Briscoe Deutscher, MD [facility]  08/18/2023, 6:02 PM

## 2023-08-18 NOTE — ED Provider Notes (Signed)
[place] EMERGENCY DEPARTMENT AT [facility] Provider Note   CSN: [id number] Arrival date & time: 08/18/23  1537     History  Chief Complaint  Patient presents with   Back Pain    Misty Arnold is a 52 y.o. female.  52 year old female with history of metastatic breast cancer presents with worsening chronic pain.  Patient has a chronic right-sided chest wound which she states actually been improving but is caused her great discomfort.  She also has history of prior thoracic spine surgery due to spinal mets.  States that due to the weather becoming cold her pain has become worse.  Denies any new neurological findings.  Called the cancer center and was instructed to take 10 mg of OxyContin every 3 hours.  She been doing this for the last several days without relief.  Was told to come here for further evaluation.  Patient has history of similar symptoms require hospitalization for pain management       Home Medications Prior to Admission medications   Medication Sig Start Date End Date Taking? Authorizing Provider  atenolol (TENORMIN) 50 MG tablet Take 1 tablet (50 mg total) by mouth 2 (two) times daily. 04/24/22   Serena Croissant, MD  celecoxib (CELEBREX) 200 MG capsule Take 1 capsule (200 mg total) by mouth at bedtime. 07/03/23   Serena Croissant, MD  Cholecalciferol (VITAMIN D3) 125 MCG (5000 UT) CAPS Take 1 capsule (5,000 Units total) by mouth daily. 10/16/21   Edsel Petrin, DO  diazepam (VALIUM) 5 MG tablet Take 1 tablet (5 mg total) by mouth every 6 (six) hours as needed for muscle spasms. 07/03/23   Serena Croissant, MD  diazepam (VALIUM) 5 MG tablet Take 1 tablet (5 mg total) by mouth every 6 (six) hours as needed for muscle spasms 07/03/23   Serena Croissant, MD  elacestrant hydrochloride (ORSERDU) 345 MG tablet Take 1 tablet (345 mg total) by mouth daily. Take with food. 04/02/23   Serena Croissant, MD  levofloxacin (LEVAQUIN) 750 MG tablet Take 1 tablet (750 mg total) by  mouth daily. 05/24/23   Serena Croissant, MD  metroNIDAZOLE (METROGEL) 0.75 % gel Apply topically 2 (two) times daily. 08/12/23   Loa Socks, NP  Multiple Vitamins-Minerals (MULTIVITAMIN WITH MINERALS) tablet FLORADIX Supplement-Iron +minerals 10/16/21   Edsel Petrin, DO  Nutritional Supplements (,FEEDING SUPPLEMENT, PROSOURCE PLUS) liquid Take 30 mLs by mouth 4 (four) times daily - after meals and at bedtime. 10/16/21   Edsel Petrin, DO  ondansetron (ZOFRAN) 8 MG tablet Take by mouth. 10/09/21   [provider]  Oxycodone HCl 10 MG TABS Take 1 tablet (10 mg total) by mouth every 3 (three) hours as needed ((score 7 to 10)). 07/03/23   Serena Croissant, MD  polyethylene glycol (MIRALAX / GLYCOLAX) 17 g packet Take 17 g by mouth daily. 10/16/21   Edsel Petrin, DO  potassium chloride SA (KLOR-CON M) 20 MEQ tablet Take 1 tablet by mouth daily. 06/26/22   Serena Croissant, MD  Silver-Carboxymethylcellulose (AQUACEL AG ADVANTAGE) 4"X5" PADS Apply 1 Pad topically in the morning and at bedtime. 07/03/23   Serena Croissant, MD      Allergies    Amoxicillin, Penicillins, Pork-derived products, Penicillin g, and Tamoxifen    Review of Systems   Review of Systems  All other systems reviewed and are negative.   Physical Exam Updated Vital Signs BP (!) 168/102 (BP Location: Left Arm)   Pulse (!) 116   Temp 98.9  F (37.2 C) (Oral)   Resp 18   SpO2 99%  Physical Exam Vitals and nursing note reviewed. Exam conducted with a chaperone present.  Constitutional:      General: She is not in acute distress.    Appearance: Normal appearance. She is well-developed. She is not toxic-appearing.  HENT:     Head: Normocephalic and atraumatic.  Eyes:     General: Lids are normal.     Conjunctiva/sclera: Conjunctivae normal.     Pupils: Pupils are equal, round, and reactive to light.  Neck:     Thyroid: No thyroid mass.     Trachea: No tracheal deviation.  Cardiovascular:      Rate and Rhythm: Normal rate and regular rhythm.     Heart sounds: Normal heart sounds. No murmur heard.    No gallop.  Pulmonary:     Effort: Pulmonary effort is normal. No respiratory distress.     Breath sounds: Normal breath sounds. No stridor. No decreased breath sounds, wheezing, rhonchi or rales.  Chest:    Abdominal:     General: There is no distension.     Palpations: Abdomen is soft.     Tenderness: There is no abdominal tenderness. There is no rebound.  Musculoskeletal:        General: No tenderness. Normal range of motion.     Cervical back: Normal range of motion and neck supple.  Skin:    General: Skin is warm and dry.     Findings: No abrasion or rash.  Neurological:     General: No focal deficit present.     Mental Status: She is alert and oriented to person, place, and time. Mental status is at baseline.     GCS: GCS eye subscore is 4. GCS verbal subscore is 5. GCS motor subscore is 6.     Cranial Nerves: Cranial nerves are intact. No cranial nerve deficit.     Sensory: No sensory deficit.     Motor: Motor function is intact.  Psychiatric:        Attention and Perception: Attention normal.        Speech: Speech normal.        Behavior: Behavior normal.     ED Results / Procedures / Treatments   Labs (all labs ordered are listed, but only abnormal results are displayed) Labs Reviewed  CBC WITH DIFFERENTIAL/PLATELET  COMPREHENSIVE METABOLIC PANEL    EKG None  Radiology No results found.  Procedures Procedures    Medications Ordered in ED Medications  morphine (PF) 4 MG/ML injection 8 mg (has no administration in time range)    ED Course/ Medical Decision Making/ A&P                                 Medical Decision Making Amount and/or Complexity of Data Reviewed Labs: ordered.  Risk Prescription drug management.   Patient's labs are overall reassuring.  Given morphine and had temporary relief but the pain came back.  Also given IV  fluids.  No focal neurological deficits at this time.  Do not feel that she needs to have any imaging of her spine.  Due to her unfortunate oncologic pain she Misty require admission.  Consult hospitalist        Final Clinical Impression(s) / ED Diagnoses Final diagnoses:  None    Rx / DC Orders ED Discharge Orders     None  Lorre Nick, MD 08/18/23 [phone]

## 2023-08-18 NOTE — ED Notes (Signed)
ED TO INPATIENT HANDOFF REPORT  ED Nurse Name and Phone #: Thamas Jaegers Name/Age/Gender Misty Arnold 52 y.o. female Room/Bed: WA02/WA02  Code Status   Code Status: Prior  Home/SNF/Other Home Patient oriented to: self, place, time, and situation Is this baseline? Yes   Triage Complete: Triage complete  Chief Complaint Intractable pain [R52]  Triage Note Pt here with complaints of back pain and decreased po intake. Has been taking oxycodone q3h without relief. Told to come here if no relief for IV pain control.    Allergies Allergies  Allergen Reactions   Amoxicillin     Other reaction(s): rash/itching   Penicillins Rash    Did it involve swelling of the face/tongue/throat, SOB, or low BP? no Did it involve sudden or severe rash/hives, skin peeling, or any reaction on the inside of your mouth or nose? Yes Did you need to seek medical attention at a hospital or doctor's office? No When did it last happen? early 20's     If all above answers are "NO", may proceed with cephalosporin use.   Pork-Derived Products    Penicillin G    Tamoxifen Hypertension    Level of Care/Admitting Diagnosis ED Disposition     ED Disposition  Admit   Condition  --   Comment  Hospital Area: [facility] [[id number]]  Level of Care: Med-Surg [16]  [facility] place patient in observation at [facility] or [facility] if equivalent level of care is available:: Yes  Covid Evaluation: Asymptomatic - no recent exposure (last 10 days) testing not required  Diagnosis: Intractable pain [161096]  Admitting Physician: Briscoe Deutscher [[id number]]  Attending Physician: Briscoe Deutscher [[id number]]          B Medical/Surgery History Past Medical History:  Diagnosis Date   Abnormal uterine bleeding    Anemia    Breast cancer (HCC)    Depression    Fibroid    Hormone disorder    Hypertension    Infertility, female    Past Surgical History:  Procedure Laterality Date   ABDOMINAL  HYSTERECTOMY     BREAST SURGERY     LAMINECTOMY N/A 01/04/2020   Procedure: Thoracic Eight, Thoracic Nine THORACIC LAMINECTOMY FOR TUMOR with Thoracic Seven-Thoracic Ten instrumentation;  Surgeon: Julio Sicks, MD;  Location: [facility] OR;  Service: Neurosurgery;  Laterality: N/A;  Thoracic Eight, Thoracic Nine THORACIC LAMINECTOMY FOR TUMOR with Thoracic Seven-Thoracic Ten instrumentation   MASTECTOMY     salivary gland stone       A IV Location/Drains/Wounds Patient Lines/Drains/Airways Status     Active Line/Drains/Airways     Name Placement date Placement time Site Days   Peripheral IV 08/18/23 20 G Left Antecubital 08/18/23  1634  Antecubital  less than 1   Pressure Injury 01/18/20 Coccyx Mid;Anterior Stage 2 -  Partial thickness loss of dermis presenting as a shallow open injury with a red, pink wound bed without slough. 2 small open areas that have 2 small open areas within them. 01/18/20  1830  -- 1308   Wound / Incision (Open or Dehisced) 01/04/20 Breast Right 01/04/20  1700  Breast  1322   Wound / Incision (Open or Dehisced) 10/06/21 Breast Right 10/06/21  1407  Breast  681            Intake/Output Last 24 hours No intake or output data in the 24 hours ending 08/18/23 1759  Labs/Imaging Results for orders placed or performed during the hospital encounter of 08/18/23 (  from the past 48 hour(s))  CBC with Differential/Platelet     Status: Abnormal   Collection Time: 08/18/23  4:42 PM  Result Value Ref Range   WBC 10.9 (H) 4.0 - 10.5 K/uL   RBC 4.68 3.87 - 5.11 MIL/uL   Hemoglobin 13.9 12.0 - 15.0 g/dL   HCT 40.9 81.1 - 91.4 %   MCV 89.5 80.0 - 100.0 fL   MCH 29.7 26.0 - 34.0 pg   MCHC 33.2 30.0 - 36.0 g/dL   RDW 78.2 95.6 - 21.3 %   Platelets 309 150 - 400 K/uL   nRBC 0.0 0.0 - 0.2 %   Neutrophils Relative % 88 %   Neutro Abs 9.5 (H) 1.7 - 7.7 K/uL   Lymphocytes Relative 6 %   Lymphs Abs 0.7 0.7 - 4.0 K/uL   Monocytes Relative 6 %   Monocytes Absolute 0.7 0.1 - 1.0 K/uL    Eosinophils Relative 0 %   Eosinophils Absolute 0.0 0.0 - 0.5 K/uL   Basophils Relative 0 %   Basophils Absolute 0.0 0.0 - 0.1 K/uL   Immature Granulocytes 0 %   Abs Immature Granulocytes 0.04 0.00 - 0.07 K/uL    Comment: Performed at [facility], [address], [place], [place] [address]  Comprehensive metabolic panel     Status: Abnormal   Collection Time: 08/18/23  4:42 PM  Result Value Ref Range   Sodium 133 (L) 135 - 145 mmol/L   Potassium 4.1 3.5 - 5.1 mmol/L   Chloride 98 98 - 111 mmol/L   CO2 21 (L) 22 - 32 mmol/L   Glucose, Bld 129 (H) 70 - 99 mg/dL    Comment: Glucose reference range applies only to samples taken after fasting for at least 8 hours.   BUN 11 6 - 20 mg/dL   Creatinine, Ser 8.46 0.44 - 1.00 mg/dL   Calcium 9.0 8.9 - 96.2 mg/dL   Total Protein 7.5 6.5 - 8.1 g/dL   Albumin 3.6 3.5 - 5.0 g/dL   AST 33 15 - 41 U/L   ALT 12 0 - 44 U/L   Alkaline Phosphatase 87 38 - 126 U/L   Total Bilirubin 1.1 0.3 - 1.2 mg/dL   GFR, Estimated >95 >28 mL/min    Comment: (NOTE) Calculated using the CKD-EPI Creatinine Equation (2021)    Anion gap 14 5 - 15    Comment: Performed at [facility], [address], [place], [place] [address]   No results found.  Pending Labs Unresulted Labs (From admission, onward)    None       Vitals/Pain Today's Vitals   08/18/23 1605 08/18/23 1655 08/18/23 1700 08/18/23 1740  BP: (!) 168/106 (!) 150/88 (!) 140/84   Pulse: (!) 112 (!) 110 (!) 108   Resp: 17 16 17    Temp:      TempSrc:      SpO2: 99% 94% 94%   PainSc:    5     Isolation Precautions No active isolations  Medications Medications  lactated ringers infusion ( Intravenous New Bag/Given 08/18/23 1635)  morphine (PF) 4 MG/ML injection 8 mg (has no administration in time range)  morphine (PF) 4 MG/ML injection 8 mg (8 mg Intravenous Given 08/18/23 1634)  lactated ringers bolus 1,000 mL (1,000 mLs Intravenous New Bag/Given  08/18/23 1634)  ondansetron (ZOFRAN) injection 4 mg (4 mg Intravenous Given 08/18/23 1634)    Mobility walks     Focused Assessments  R Recommendations: See Admitting Provider Note  Report given to:   Additional Notes:

## 2023-08-18 NOTE — ED Triage Notes (Signed)
Pt here with complaints of back pain and decreased po intake. Has been taking oxycodone q3h without relief. Told to come here if no relief for IV pain control.

## 2023-08-19 DIAGNOSIS — Z515 Encounter for palliative care: Secondary | ICD-10-CM

## 2023-08-19 DIAGNOSIS — C50919 Malignant neoplasm of unspecified site of unspecified female breast: Secondary | ICD-10-CM

## 2023-08-19 DIAGNOSIS — R52 Pain, unspecified: Secondary | ICD-10-CM | POA: Diagnosis not present

## 2023-08-19 DIAGNOSIS — G893 Neoplasm related pain (acute) (chronic): Secondary | ICD-10-CM | POA: Diagnosis not present

## 2023-08-19 LAB — CBC
HCT: 38 % (ref 36.0–46.0)
Hemoglobin: 12.6 g/dL (ref 12.0–15.0)
MCH: 29.9 pg (ref 26.0–34.0)
MCHC: 33.2 g/dL (ref 30.0–36.0)
MCV: 90.3 fL (ref 80.0–100.0)
Platelets: 273 10*3/uL (ref 150–400)
RBC: 4.21 MIL/uL (ref 3.87–5.11)
RDW: 12.6 % (ref 11.5–15.5)
WBC: 8.6 10*3/uL (ref 4.0–10.5)
nRBC: 0 % (ref 0.0–0.2)

## 2023-08-19 LAB — BASIC METABOLIC PANEL
Anion gap: 7 (ref 5–15)
BUN: 6 mg/dL (ref 6–20)
CO2: 26 mmol/L (ref 22–32)
Calcium: 8.8 mg/dL — ABNORMAL LOW (ref 8.9–10.3)
Chloride: 102 mmol/L (ref 98–111)
Creatinine, Ser: 0.69 mg/dL (ref 0.44–1.00)
GFR, Estimated: >60 mL/min (ref >60)
Glucose, Bld: 121 mg/dL — ABNORMAL HIGH (ref 70–99)
Potassium: 3.5 mmol/L (ref 3.5–5.1)
Sodium: 135 mmol/L (ref 135–145)

## 2023-08-19 LAB — MAGNESIUM: Magnesium: 2.2 mg/dL (ref 1.7–2.4)

## 2023-08-19 LAB — HIV ANTIBODY (ROUTINE TESTING W REFLEX): HIV Screen 4th Generation wRfx: NONREACTIVE

## 2023-08-19 MED ORDER — LACTATED RINGERS IV SOLN: INTRAVENOUS | Status: AC

## 2023-08-19 MED ORDER — OXYCODONE HCL ER 10 MG PO T12A
10.0000 mg | EXTENDED_RELEASE_TABLET | Freq: Two times a day (BID) | ORAL | Status: DC
  Administered 2023-08-19 – 2023-08-20 (×2): 10 mg via ORAL
  Filled 2023-08-19 (×2): qty 1

## 2023-08-19 MED ORDER — METRONIDAZOLE 0.75 % EX GEL
Freq: Two times a day (BID) | CUTANEOUS | Status: DC
  Filled 2023-08-19 (×3): qty 45

## 2023-08-19 NOTE — Plan of Care (Signed)

## 2023-08-19 NOTE — Progress Notes (Signed)
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  She lives at home with her old parents.  She gets around by herself without any use of assistive device.  She does have a fungating right chest wall tumor that she does wound care herself.  On the emergency room, hemodynamically stable.  Her heart rate was 110.  Admitted due to significant metastatic pain.   Assessment & Plan:   Intractable cancer-related pain: Patient does have issues with cancer related pain, followed by pain clinic and palliative care as outpatient.  She is prescribed oxycodone at home, she is exhausted and this scared to take pain medicine every 3 hours without good pain relief. Keep patient on IV and oral opiates for pain relief.  Bowel management.  Adequate laxatives. Patient will probably need more symptom management, she may benefit with long-acting pain medications or fentanyl patch.  Will consult palliative care team to coordinate, assess and formulate a plan of care for pain management.  Also asking help for other symptom management including anxiety and appetite. Needs to remain in the hospital today due to inadequate symptom control.  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  Currently she is on elacestrant and followed by oncology.  Chest wall wound: Patient has a fungating mass right chest wall.  She does her own dressing changes with Flagyl.  Continue.   DVT prophylaxis: rivaroxaban (XARELTO) tablet 10 mg Start: 08/18/23 1900 rivaroxaban (XARELTO) tablet 10 mg   Code Status: Full  code Family Communication: None at the bedside Disposition Plan: Status is: Observation The patient will require care spanning > 2 midnights and should be moved to inpatient because: Symptom control     Consultants:  Palliative care  Procedures:  None  Antimicrobials:  Metronidazole topical cream, long-term   Subjective: Patient seen in the morning rounds.  She was tearful due to poor appetite.  She has been taking her oxycodone every 3 hours and tired of frequent medication intake.  Episodic pain in her wound area.  Objective: Vitals:   08/18/23 1853 08/18/23 2229 08/19/23 0129 08/19/23 0658  BP: (!) 152/100 (!) 144/89 117/82 116/79  Pulse: (!) 117 (!) 119 (!) 102 93  Resp: 20 18 18 18   Temp: 99.1 F (37.3 C) 100.2 F (37.9 C) 98.3 F (36.8 C) 98.2 F (36.8 C)  TempSrc: Oral Oral Oral Oral  SpO2:  99% 95% 97%    Intake/Output Summary (Last 24 hours) at 08/19/2023 1311 Last data filed at 08/19/2023 0333 Gross per 24 hour  Intake 1946.67 ml  Output --  Net 1946.67 ml   There were no vitals filed for this visit.  Examination:  General exam: Appears calm.  Anxious.  Looks comfortable otherwise. Respiratory system: Clear to auscultation. Respiratory effort normal. Cardiovascular system: S1 & S2 heard, RRR. No JVD, murmurs, rubs, gallops or clicks. No pedal edema. Gastrointestinal system: Abdomen is nondistended, soft and nontender. No organomegaly or masses felt. Normal bowel sounds heard. Central nervous system: Alert and oriented. No focal neurological deficits. Patient does have large fungating mass right chest wall, ulceration with mild exudates.    Data Reviewed: I have  personally reviewed following labs and imaging studies  CBC: Recent Labs  Lab 08/18/23 1642 08/19/23 0542  WBC 10.9* 8.6  NEUTROABS 9.5*  --   HGB 13.9 12.6  HCT 41.9 38.0  MCV 89.5 90.3  PLT 309 273   Basic Metabolic Panel: Recent Labs  Lab 08/18/23 1642 08/19/23 0542  NA  133* 135  K 4.1 3.5  CL 98 102  CO2 21* 26  GLUCOSE 129* 121*  BUN 11 6  CREATININE 0.92 0.69  CALCIUM 9.0 8.8*  MG  --  2.2   GFR: Estimated Creatinine Clearance: 72.2 mL/min (by C-G formula based on SCr of 0.69 mg/dL). Liver Function Tests: Recent Labs  Lab 08/18/23 1642  AST 33  ALT 12  ALKPHOS 87  BILITOT 1.1  PROT 7.5  ALBUMIN 3.6   No results for input(s): "LIPASE", "AMYLASE" in the last 168 hours. No results for input(s): "AMMONIA" in the last 168 hours. Coagulation Profile: No results for input(s): "INR", "PROTIME" in the last 168 hours. Cardiac Enzymes: No results for input(s): "CKTOTAL", "CKMB", "CKMBINDEX", "TROPONINI" in the last 168 hours. BNP (last 3 results) No results for input(s): "PROBNP" in the last 8760 hours. HbA1C: No results for input(s): "HGBA1C" in the last 72 hours. CBG: No results for input(s): "GLUCAP" in the last 168 hours. Lipid Profile: No results for input(s): "CHOL", "HDL", "LDLCALC", "TRIG", "CHOLHDL", "LDLDIRECT" in the last 72 hours. Thyroid Function Tests: No results for input(s): "TSH", "T4TOTAL", "FREET4", "T3FREE", "THYROIDAB" in the last 72 hours. Anemia Panel: No results for input(s): "VITAMINB12", "FOLATE", "FERRITIN", "TIBC", "IRON", "RETICCTPCT" in the last 72 hours. Sepsis Labs: No results for input(s): "PROCALCITON", "LATICACIDVEN" in the last 168 hours.  No results found for this or any previous visit (from the past 240 hour(s)).       Radiology Studies: No results found.      Scheduled Meds:  celecoxib  200 mg Oral QHS   metroNIDAZOLE   Topical BID   rivaroxaban  10 mg Oral Daily   Continuous Infusions:  lactated ringers 100 mL/hr at 08/19/23 1252     LOS: 0 days    Time spent: 35 minutes    Dorcas Carrow, MD [facility]

## 2023-08-19 NOTE — Care Management Obs Status (Signed)
MEDICARE OBSERVATION STATUS NOTIFICATION   Patient Details  Name: Misty Arnold MRN: [id number] Date of Birth: 1970/11/10   Medicare Observation Status Notification Given:  Yes    Beckie Busing, RN 08/19/2023, 2:08 PM

## 2023-08-19 NOTE — Plan of Care (Signed)

## 2023-08-19 NOTE — Consult Note (Deleted)
Palliative Care Consult Note                                  Date: 08/19/2023   Patient Name: Misty Arnold  DOB: June 22, 1971  MRN: [id number]  Age / Sex: 52 y.o., female  PCP: Serena Croissant, MD Referring Physician: Dorcas Carrow, MD  Reason for Consultation: Non pain symptom management and Pain control  HPI/Patient Profile: Palliative Care consult requested for pain management in this 52 y.o. female  with past medical history of metastatic breast cancer s/p mastectomy, right chest wound, T( vertebral compression s/p tumor invasion, hypertension, depression. She was admitted on 08/18/2023 with intractable pain. Patient was initially seen at [facility] due to recurrent pain at right chest wound. Reports pain was not controlled on home regimen despite following.    Past Medical History:  Diagnosis Date   Abnormal uterine bleeding    Anemia    Breast cancer (HCC)    Depression    Fibroid    Hormone disorder    Hypertension    Infertility, female      Subjective:   This NP Royal Hawthorn reviewed medical records, received report from team, assessed the patient and then met at the patient's bedside with Ms. Hawken to discuss her intractable pain.   She is awake in bed. Appears uncomfortable in sitting position. Alert and able to engage appropriately in discussions.    Values and goals of care important to patient and family were attempted to be elicited. Ms. Westervelt is familiar to myself. I have been actively following patient over the past year for symptom management and ongoing support.   I created space and opportunity for patient to explore state of health prior to admission, thoughts, and feelings.   We discussed Her current illness and what it means in the larger context of Her on-going co-morbidities. Natural disease trajectory and expectations were discussed.  Hollyn verbalized understanding of current illness and  co-morbidities. She reports sudden onset of pain. She was on pain medication previously however after significant improvement we were able to wean her off regimen. States pain has returned in similar severity as previous incident. She tried to manage in the home with given regimen however pain became so severe which confined her to the couch in tears.   We discussed her pain at length. Ms. Fredrick reports pain to right chest wall that radiates across and around to back at times. Describes pain as sharp, stabbing, burning, throbbing, and constant. Current medications is providing some relief however pain worsens with activity, certain positions, and at bedtime.   Education provided on home regimen. We discussed restarting long-acting medication in addition to breakthrough medication. She verbalized understanding and appreciation.   I discussed the importance of continued conversation with family and their medical providers regarding overall plan of care and treatment options, ensuring decisions are within the context of the patients values and GOCs.  Questions and concerns were addressed.  The patient  was encouraged to call with questions or concerns.  PMT will continue to support holistically as needed.   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 no Did it involve sudden or severe rash/hives, skin peeling, or any reaction on the inside of your mouth or nose? Yes Did you need to seek medical attention at a hospital or doctor's office? No When did it last happen? early 20's     If all above answers are "NO", may proceed with cephalosporin use.   Pork-Derived Products    Penicillin G    Tamoxifen Hypertension   Tramadol Other (See Comments)    Increases her blood pressure    Review of Systems  Constitutional:  Positive for activity change and appetite change.  Musculoskeletal:  Positive for arthralgias (chest wall pain at wound site) and back pain.   Unless otherwise noted, a complete review of systems is negative.  Physical Exam General: Uncomfortable appearing, sitting up in bed Cardiovascular: regular rate and rhythm Pulmonary: clear ant fields, diminished bilaterally  Abdomen: soft, nontender, + bowel sounds Extremities: no edema, no joint deformities Skin: right chest wall wound, dressing intact Neurological: Alert and oriented x3  Vital Signs:  BP (!) 150/81 (BP Location: Left Arm)   Pulse (!) 123   Temp 99.2 F (37.3 C) (Oral)   Resp 20   SpO2 91%  Pain Scale: 0-10   Pain Score: 3   SpO2: SpO2: 91 % O2 Device:SpO2: 91 % O2 Flow Rate: .   IO: Intake/output summary:  Intake/Output Summary (Last 24 hours) at 08/19/2023 1643 Last data filed at 08/19/2023 1000 Gross per 24 hour  Intake 2066.67 ml  Output --  Net 2066.67 ml    LBM: Last BM Date : 08/17/23 Baseline Weight:   Most recent weight:        Palliative Assessment/Data:    Advanced Care Planning:   Primary Decision Maker: PATIENT  Code Status/Advance Care Planning: Full code  Assessment & Plan:   SUMMARY OF RECOMMENDATIONS   Continue with current plan of care  Patient to continue with close follow-up at discharge with myself at [facility] in collaboration with  Oncology. PMT will continue to support and follow as needed. Please call team line with urgent unmet palliative needs.  Symptom Management:  Neoplasm related pain Xtampza 9mg  every 12 hours Oxycodone 10 mg every 4 hours as needed for moderate to severe or breakthrough pain IV morphine as needed for uncontrolled pain outside of oral medication Nausea Zofran 8mg  as needed  Constipation Senna 2 tabs at bedtime Miralax daily  Palliative Prophylaxis:  Bowel Regimen and Frequent Pain Assessment  Additional Recommendations (Limitations, Scope, Preferences): Full Scope Treatment  Psycho-social/Spiritual:  Desire for further Chaplaincy support: no Additional Recommendations:  education on symptom management   Prognosis:  Unable to determine  Discharge Planning:  Home with Palliative Services at [facility]  Patient expressed understanding and was in agreement with this plan.    Time Total: 75 min  Visit consisted of counseling and education dealing with the complex and emotionally intense issues of symptom management and palliative care in the setting of serious and potentially life-threatening illness.  Signed by:  Willette Alma, AGPCNP-BC [facility]   Phone: [phone] Pager: [phone] Amion: Thea Alken   Thank you for allowing the Palliative Medicine Team to assist in the care of this patient. Please utilize secure chat with additional questions, if there is no response within 30 minutes please call the above phone number. Palliative Medicine Team providers are available by phone from 7am to 5pm daily and can be reached through the team cell phone.  Should this patient require assistance outside of these hours, please call the patient's attending physician.  *Please note that this is a verbal dictation therefore any spelling or grammatical errors are due to the "[employer] One" system interpretation.

## 2023-08-19 NOTE — Progress Notes (Signed)
   08/19/23 2053  Assess: MEWS Score  Temp (!) 100.4 F (38 C)  BP (!) 141/92  MAP (mmHg) 106  Pulse Rate (!) 126  Resp 18  SpO2 98 %  O2 Device Room Air  Assess: MEWS Score  MEWS Temp 0  MEWS Systolic 0  MEWS Pulse 2  MEWS RR 0  MEWS LOC 0  MEWS Score 2  MEWS Score Color Yellow  Assess: if the MEWS score is Yellow or Red  Were vital signs accurate and taken at a resting state? Yes  Does the patient meet 2 or more of the SIRS criteria? No  MEWS guidelines implemented  No, previously yellow, continue vital signs every 4 hours  Notify: Charge Nurse/RN  Name of Charge Nurse/RN Notified Meredith, RN  Provider Notification  Provider Name/Title Chinita Greenland, NP  Date Provider Notified 08/19/23  Time Provider Notified 2055  Method of Notification Page  Notification Reason Other (Comment) (yellow MEWS)  Provider response No new orders  Date of Provider Response 08/19/23  Time of Provider Response 2055  Assess: SIRS CRITERIA  SIRS Temperature  0  SIRS Pulse 1  SIRS Respirations  0  SIRS WBC 0  SIRS Score Sum  1   Will continue q4 VS and monitoring

## 2023-08-20 ENCOUNTER — Other Ambulatory Visit (HOSPITAL_COMMUNITY): Payer: Self-pay

## 2023-08-20 DIAGNOSIS — R52 Pain, unspecified: Secondary | ICD-10-CM | POA: Diagnosis not present

## 2023-08-20 DIAGNOSIS — G893 Neoplasm related pain (acute) (chronic): Secondary | ICD-10-CM | POA: Diagnosis not present

## 2023-08-20 MED ORDER — XTAMPZA ER 9 MG PO C12A
9.0000 mg | EXTENDED_RELEASE_CAPSULE | Freq: Two times a day (BID) | ORAL | 0 refills | Status: DC
  Filled 2023-08-20: qty 30, 15d supply, fill #0

## 2023-08-20 NOTE — Discharge Summary (Signed)
Physician Discharge Summary  Misty Arnold ION:[id number] DOB: 03-22-1971 DOA: 08/18/2023  PCP: Serena Croissant, MD  Admit date: 08/18/2023 Discharge date: 08/20/2023  Admitted From: Home Disposition: Home  Recommendations for Outpatient Follow-up:  Follow up with cancer center and pain management clinic as already scheduled.  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  She lives at home with her old parents.  She gets around by herself without any use of assistive device.  She does have a fungating right chest wall tumor that she does wound care herself.  On the emergency room, hemodynamically stable.  Her heart rate was 110.  Admitted due to significant metastatic pain.     Assessment & Plan:   Intractable cancer-related pain: Patient does have issues with cancer related pain, followed by pain clinic and palliative care as outpatient.  She is prescribed oxycodone at home, she is exhausted taking medicine to control her pain every 3 hours.  After discussion with palliative care, her pain management schedule as below.   OxyContin ER 9 mg twice daily  Oxycodone 10 mg every 3 hours for breakthrough pain  Celebrex 200 mg daily at bedtime.  Additional Tylenol or muscle relaxants as needed.   With above regimen, her symptoms are better controlled today.  She is going home.  She will follow-up with palliative care.     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  Currently she is on elacestrant and followed by  oncology.   Chest wall wound: Patient has a fungating mass right chest wall.  She does her own dressing changes with Flagyl.  Continue.  Stable for discharge.    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 no Did it involve sudden or severe rash/hives, skin peeling, or any reaction on the inside of your mouth or nose? Yes Did you need to seek medical attention at a hospital or doctor's office? No When did it last happen? early 20's     If all above answers are "NO", may proceed with cephalosporin use.   Pork-derived Products    Penicillin G    Tamoxifen Hypertension   Tramadol Other (See Comments)   Increases her blood pressure        Medication List     STOP taking these medications    (feeding supplement) PROSource Plus liquid  levofloxacin 750 MG tablet Commonly known as: LEVAQUIN       TAKE these medications    Aquacel Ag Advantage 4"X5" Pads Apply 1 Pad topically in the morning and at bedtime.   atenolol 50 MG tablet Commonly known as: TENORMIN Take 1 tablet (50 mg total) by mouth 2 (two) times daily.   celecoxib 200 MG capsule Commonly known as: CELEBREX Take 1 capsule (200 mg total) by mouth at bedtime.   diazepam 5 MG tablet Commonly known as: VALIUM Take 1  tablet (5 mg total) by mouth every 6 (six) hours as needed for muscle spasms. What changed: Another medication with the same name was removed. Continue taking this medication, and follow the directions you see here.   elacestrant hydrochloride 345 MG tablet Commonly known as: ORSERDU Take 1 tablet (345 mg total) by mouth daily. Take with food.   metroNIDAZOLE 0.75 % gel Commonly known as: METROGEL Apply topically 2 (two) times daily.   multivitamin with minerals tablet FLORADIX Supplement-Iron +minerals   ondansetron 8 MG tablet Commonly known as: ZOFRAN Take 8 mg by mouth every 8 (eight) hours as needed for refractory nausea / vomiting, vomiting or nausea.   Oxycodone HCl 10 MG Tabs Take 1 tablet (10 mg total) by mouth every 3 (three) hours as needed ((score 7 to 10)). What changed: reasons to take this   polyethylene glycol 17 g packet Commonly known as: MIRALAX / GLYCOLAX Take 17 g by mouth daily.   potassium chloride SA 20 MEQ tablet Commonly known as: KLOR-CON M Take 1 tablet by mouth daily.   Vitamin D3 125 MCG (5000 UT) Caps Take 1 capsule (5,000 Units total) by mouth daily.   Xtampza ER 9 MG C12a Generic drug: oxyCODONE ER Take 1 capsule by mouth every 12 (twelve) hours.               Discharge Care Instructions  (From admission, onward)           Start     Ordered   08/20/23 0000  Discharge wound care:       Comments: Daily self wound care with cleaning, flagyl ointment and covering as you doing   08/20/23 1056            Allergies  Allergen Reactions   Amoxicillin     Other reaction(s): rash/itching   Penicillins Rash    Did it involve swelling of the face/tongue/throat, SOB, or low BP? no Did it involve sudden or severe rash/hives, skin peeling, or any reaction on the inside of your mouth or nose? Yes Did you need to seek medical attention at a hospital or doctor's office? No When did it last happen? early 20's     If all above  answers are "NO", may proceed with cephalosporin use.   Pork-Derived Products    Penicillin G    Tamoxifen Hypertension   Tramadol Other (See Comments)    Increases her blood pressure    Consultations: Palliative care   Procedures/Studies: No results found. (Echo, Carotid, EGD, Colonoscopy, ERCP)    Subjective: Patient was seen and examined.  Still has occasional pain but she felt much better after receiving OxyContin last night.  Appetite is slightly improved but she tells me that if her pain is controlled everything will be better.  Comfortable with plan to go home.  She does her dressing herself. Patient was reported to have low-grade fever overnight without any evidence of infection.   Discharge Exam: Vitals:  08/19/23 2311 08/20/23 0453  BP: 121/73 128/89  Pulse: (!) 112 89  Resp: 18 18  Temp: 99.4 F (37.4 C) 98.2 F (36.8 C)  SpO2: 97% 100%   Vitals:   08/19/23 2053 08/19/23 2311 08/20/23 0010 08/20/23 0453  BP: (!) 141/92 121/73  128/89  Pulse: (!) 126 (!) 112  89  Resp: 18 18  18   Temp: (!) 100.4 F (38 C) 99.4 F (37.4 C)  98.2 F (36.8 C)  TempSrc: Oral Oral  Oral  SpO2: 98% 97%  100%  Weight:   58.3 kg   Height:   5\' 5"  (1.651 m)     General: Pt is alert, awake, not in acute distress Walking around in the room.     The results of significant diagnostics from this hospitalization (including imaging, microbiology, ancillary and laboratory) are listed below for reference.     Microbiology: No results found for this or any previous visit (from the past 240 hour(s)).   Labs: BNP (last 3 results) No results for input(s): "BNP" in the last 8760 hours. Basic Metabolic Panel: Recent Labs  Lab 08/18/23 1642 08/19/23 0542  NA 133* 135  K 4.1 3.5  CL 98 102  CO2 21* 26  GLUCOSE 129* 121*  BUN 11 6  CREATININE 0.92 0.69  CALCIUM 9.0 8.8*  MG  --  2.2   Liver Function Tests: Recent Labs  Lab 08/18/23 1642  AST 33  ALT 12  ALKPHOS 87   BILITOT 1.1  PROT 7.5  ALBUMIN 3.6   No results for input(s): "LIPASE", "AMYLASE" in the last 168 hours. No results for input(s): "AMMONIA" in the last 168 hours. CBC: Recent Labs  Lab 08/18/23 1642 08/19/23 0542  WBC 10.9* 8.6  NEUTROABS 9.5*  --   HGB 13.9 12.6  HCT 41.9 38.0  MCV 89.5 90.3  PLT 309 273   Cardiac Enzymes: No results for input(s): "CKTOTAL", "CKMB", "CKMBINDEX", "TROPONINI" in the last 168 hours. BNP: Invalid input(s): "POCBNP" CBG: No results for input(s): "GLUCAP" in the last 168 hours. D-Dimer No results for input(s): "DDIMER" in the last 72 hours. Hgb A1c No results for input(s): "HGBA1C" in the last 72 hours. Lipid Profile No results for input(s): "CHOL", "HDL", "LDLCALC", "TRIG", "CHOLHDL", "LDLDIRECT" in the last 72 hours. Thyroid function studies No results for input(s): "TSH", "T4TOTAL", "T3FREE", "THYROIDAB" in the last 72 hours.  Invalid input(s): "FREET3" Anemia work up No results for input(s): "VITAMINB12", "FOLATE", "FERRITIN", "TIBC", "IRON", "RETICCTPCT" in the last 72 hours. Urinalysis    Component Value Date/Time   COLORURINE YELLOW 10/14/2021 1150   APPEARANCEUR CLEAR 10/14/2021 1150   LABSPEC 1.019 10/14/2021 1150   PHURINE 6.0 10/14/2021 1150   GLUCOSEU NEGATIVE 10/14/2021 1150   HGBUR NEGATIVE 10/14/2021 1150   BILIRUBINUR NEGATIVE 10/14/2021 1150   KETONESUR NEGATIVE 10/14/2021 1150   PROTEINUR 30 (A) 10/14/2021 1150   NITRITE NEGATIVE 10/14/2021 1150   LEUKOCYTESUR NEGATIVE 10/14/2021 1150   Sepsis Labs Recent Labs  Lab 08/18/23 1642 08/19/23 0542  WBC 10.9* 8.6   Microbiology No results found for this or any previous visit (from the past 240 hour(s)).   Time coordinating discharge:  35 minutes  SIGNED:   Dorcas Carrow, MD  [facility] 08/20/2023, 10:56 AM

## 2023-08-20 NOTE — Plan of Care (Signed)
11:20- Attempted to go over discharge instructions, pt in bathroom performing wound care.  11:40: 2nd Attempt to go over discharge instructions pt still in bathroom completing wound care. 11:47: Waiting on Meds to bed  12:05- 3rd attempt to go over discharge instructions, pt still in bathroom... instructed pt to ring call light when finished.   12:30- gave pt discharge instructions. Pt stable and is being taken to discharge lobby to wait for her ride.  Problem: Education: Goal: Knowledge of General Education information will improve Description: Including pain rating scale, medication(s)/side effects and non-pharmacologic comfort measures Outcome: Adequate for Discharge   Problem: Health Behavior/Discharge Planning: Goal: Ability to manage health-related needs will improve Outcome: Adequate for Discharge   Problem: Clinical Measurements: Goal: Ability to maintain clinical measurements within normal limits will improve Outcome: Adequate for Discharge Goal: Will remain free from infection Outcome: Adequate for Discharge Goal: Diagnostic test results will improve Outcome: Adequate for Discharge Goal: Respiratory complications will improve Outcome: Adequate for Discharge Goal: Cardiovascular complication will be avoided Outcome: Adequate for Discharge   Problem: Activity: Goal: Risk for activity intolerance will decrease Outcome: Adequate for Discharge   Problem: Nutrition: Goal: Adequate nutrition will be maintained Outcome: Adequate for Discharge   Problem: Coping: Goal: Level of anxiety will decrease Outcome: Adequate for Discharge   Problem: Elimination: Goal: Will not experience complications related to bowel motility Outcome: Adequate for Discharge Goal: Will not experience complications related to urinary retention Outcome: Adequate for Discharge   Problem: Pain Managment: Goal: General experience of comfort will improve Outcome: Adequate for Discharge    Problem: Safety: Goal: Ability to remain free from injury will improve Outcome: Adequate for Discharge   Problem: Skin Integrity: Goal: Risk for impaired skin integrity will decrease Outcome: Adequate for Discharge

## 2023-08-28 NOTE — Consult Note (Signed)
Palliative Care Consult Note                                  Date: 08/28/2023   Patient Name: Misty Arnold  DOB: 04-20-1971  MRN: [id number]  Age / Sex: 52 y.o., female  PCP: Misty Croissant, MD Referring Physician: No att. providers found  Reason for Consultation: Non pain symptom management and Pain control  HPI/Patient Profile: Palliative Care consult requested for pain management in this 52 y.o. female  with past medical history of metastatic breast cancer s/p mastectomy, right chest wound, T( vertebral compression s/p tumor invasion, hypertension, depression. She was admitted on 08/18/2023 with intractable pain. Patient was initially seen at [facility] due to recurrent pain at right chest wound. Reports pain was not controlled on home regimen despite following.    Past Medical History:  Diagnosis Date   Abnormal uterine bleeding    Anemia    Breast cancer (HCC)    Depression    Fibroid    Hormone disorder    Hypertension    Infertility, female      Subjective:   This NP Misty Arnold reviewed medical records, received report from team, assessed the patient and then met at the patient's bedside with Misty Arnold to discuss her intractable pain.   She is awake in bed. Appears uncomfortable in sitting position. Alert and able to engage appropriately in discussions.    Values and goals of care important to patient and family were attempted to be elicited. Misty Arnold is familiar to myself. I have been actively following patient over the past year for symptom management and ongoing support.   I created space and opportunity for patient to explore state of health prior to admission, thoughts, and feelings.   We discussed Her current illness and what it means in the larger context of Her on-going co-morbidities. Natural disease trajectory and expectations were discussed.  Misty Arnold verbalized understanding of current illness and  co-morbidities. She reports sudden onset of pain. She was on pain medication previously however after significant improvement we were able to wean her off regimen. States pain has returned in similar severity as previous incident. She tried to manage in the home with given regimen however pain became so severe which confined her to the couch in tears.   We discussed her pain at length. [name] reports pain to right chest wall that radiates across and around to back at times. Describes pain as sharp, stabbing, burning, throbbing, and constant. Current medications is providing some relief however pain worsens with activity, certain positions, and at bedtime.   Education provided on home regimen. We discussed restarting long-acting medication in addition to breakthrough medication. She verbalized understanding and appreciation.   I discussed the importance of continued conversation with family and their medical providers regarding overall plan of care and treatment options, ensuring decisions are within the context of the patients values and GOCs.  Questions and concerns were addressed.  The patient  was encouraged to call with questions or concerns.  PMT will continue to support holistically as needed.   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 no Did it involve sudden or severe rash/hives, skin peeling, or any reaction on the inside of your mouth or nose? Yes Did you need to seek medical attention at a hospital or doctor's office? No When did it last happen?  early 20's     If all above answers are "NO", may proceed with cephalosporin use.   Pork-Derived Products    Penicillin G    Tamoxifen Hypertension   Tramadol Other (See Comments)    Increases her blood pressure    Review of Systems  Constitutional:  Positive for activity change and appetite change.  Musculoskeletal:  Positive for arthralgias (chest wall pain at wound site) and back pain.   Unless otherwise noted, a complete review of systems is negative.  Physical Exam General: Uncomfortable appearing, sitting up in bed Cardiovascular: regular rate and rhythm Pulmonary: clear ant fields, diminished bilaterally  Abdomen: soft, nontender, + bowel sounds Extremities: no edema, no joint deformities Skin: right chest wall wound, dressing intact Neurological: Alert and oriented x3  Vital Signs:  BP 128/89 (BP Location: Left Arm)   Pulse 89   Temp 98.2 F (36.8 C) (Oral)   Resp 18   Ht 5\' 5"  (1.651 m)   Wt 58.3 kg   SpO2 100%   BMI 21.38 kg/m  Pain Scale: 0-10 POSS *See Group Information*: 1-Acceptable,Awake and alert Pain Score: 3   SpO2: SpO2: 100 % O2 Device:SpO2: 100 % O2 Flow Rate: .   IO: Intake/output summary: No intake or output data in the 24 hours ending 08/28/23 0030   LBM: Last BM Date : 08/17/23 Baseline Weight: Weight: 58.3 kg Most recent weight: Weight: 58.3 kg      Palliative Assessment/Data:    Advanced Care Planning:   Primary Decision Maker: PATIENT  Code Status/Advance Care Planning: Full code  Assessment & Plan:   SUMMARY OF RECOMMENDATIONS   Continue with current plan of care  Patient to continue with close follow-up at discharge with myself at [facility] in collaboration with Oncology. PMT will continue to support and follow as needed. Please call team line with urgent unmet palliative needs.  Symptom Management:  Neoplasm related pain Xtampza 9mg  every 12 hours Oxycodone 10 mg every 4 hours as needed for moderate to severe  or breakthrough pain IV morphine as needed for uncontrolled pain outside of oral medication Nausea Zofran 8mg  as needed  Constipation Senna 2 tabs at bedtime Miralax daily  Palliative Prophylaxis:  Bowel Regimen and Frequent Pain Assessment  Additional Recommendations (Limitations, Scope, Preferences): Full Scope Treatment  Psycho-social/Spiritual:  Desire for further Chaplaincy support: no Additional Recommendations:  education on symptom management   Prognosis:  Unable to determine  Discharge Planning:  Home with Palliative Services at [facility]  Patient expressed understanding and was in agreement with this plan.    Time Total: 75 min  Visit consisted of counseling and education dealing with the complex and emotionally intense issues of symptom management and palliative care in the setting of serious and potentially life-threatening illness.  Signed by:  Willette Alma, AGPCNP-BC [facility]   Phone: [phone] Pager: [phone] Amion: Thea Alken   Thank you for allowing the Palliative Medicine Team to assist in the care of this patient. Please utilize secure chat with additional questions, if there is  no response within 30 minutes please call the above phone number. Palliative Medicine Team providers are available by phone from 7am to 5pm daily and can be reached through the team cell phone.  Should this patient require assistance outside of these hours, please call the patient's attending physician.  *Please note that this is a verbal dictation therefore any spelling or grammatical errors are due to the "Dragon [employer] One" system interpretation.

## 2023-08-29 ENCOUNTER — Telehealth: Payer: Self-pay | Admitting: Hematology and Oncology

## 2023-08-29 NOTE — Telephone Encounter (Signed)
Patient is aware of rescheduled appointment times/dates for follow up with [facility]

## 2023-08-30 ENCOUNTER — Telehealth: Payer: Self-pay | Admitting: *Deleted

## 2023-08-30 NOTE — Telephone Encounter (Signed)
Received call from pt requesting lab testing for mold be added to her labs on Monday.  Pt notified our office does not perform this testing and pt will need to f/u with PCP or allergy specialist.  Pt verbalized understanding.

## 2023-09-02 ENCOUNTER — Ambulatory Visit (HOSPITAL_COMMUNITY)
Admission: RE | Admit: 2023-09-02 | Discharge: 2023-09-02 | Disposition: A | Discharge status: Home / Self Care | Payer: Medicare PPO | Source: Ambulatory Visit | Attending: Hematology and Oncology | Admitting: Hematology and Oncology

## 2023-09-02 ENCOUNTER — Other Ambulatory Visit (HOSPITAL_COMMUNITY): Payer: Self-pay

## 2023-09-02 DIAGNOSIS — C50411 Malignant neoplasm of upper-outer quadrant of right female breast: Secondary | ICD-10-CM | POA: Insufficient documentation

## 2023-09-02 DIAGNOSIS — Z170 Estrogen receptor positive status [ER+]: Secondary | ICD-10-CM | POA: Insufficient documentation

## 2023-09-02 MED ORDER — IOHEXOL 300 MG/ML  SOLN
100.0000 mL | Freq: Once | INTRAMUSCULAR | Status: AC | PRN
  Administered 2023-09-02: 80 mL via INTRAVENOUS

## 2023-09-02 NOTE — Progress Notes (Deleted)
Palliative Medicine [facility]  Telephone:([phone] Fax:([phone]) [phone]   Name: Misty Arnold Date: 09/02/2023 MRN: [id number]  DOB: Mar 15, 1971  Patient Care Team: Serena Croissant, MD as PCP - General (Hematology and Oncology)    INTERVAL HISTORY: Misty Arnold is a 52 y.o. female with right metastatic breast cancer s/p mastectomy, large right chest wound secondary to necrotic mass, weankess due to T9 vertebral compression s/p tumor resection and decompressive laminectomy, and hypertension. Patient did not receive treatment for some time after seeking care in [place]. Currently receiving Enhertu treatment. Palliative following for symptom management and goals of care.   SOCIAL HISTORY:    Misty Arnold reports that she has never smoked. She has never used smokeless tobacco. She reports that she does not currently use alcohol. She reports that she does not use drugs.  ADVANCE DIRECTIVES:  Advance directives completed during recent hospitalization 08/27/2021.  Documents are on file and have been reviewed and [name].  CODE STATUS: Full code  PAST MEDICAL HISTORY: Past Medical History:  Diagnosis Date   Abnormal uterine bleeding    Anemia    Breast cancer (HCC)    Depression    Fibroid    Hormone disorder    Hypertension    Infertility, female    ALLERGIES:  is allergic to amoxicillin, penicillins, pork-derived products, penicillin g, tamoxifen, and tramadol.  MEDICATIONS:  Current Outpatient Medications  Medication Sig Dispense Refill   atenolol (TENORMIN) 50 MG tablet Take 1 tablet (50 mg total) by mouth 2 (two) times daily. 180 tablet 3   celecoxib (CELEBREX) 200 MG capsule Take 1 capsule (200 mg total) by mouth at bedtime. 30 capsule 6   Cholecalciferol (VITAMIN D3) 125 MCG (5000 UT) CAPS Take 1 capsule (5,000 Units total) by mouth daily. 30 capsule 3   diazepam (VALIUM) 5 MG tablet Take 1 tablet (5 mg total) by mouth every 6 (six) hours as needed for  muscle spasms. 30 tablet 3   elacestrant hydrochloride (ORSERDU) 345 MG tablet Take 1 tablet (345 mg total) by mouth daily. Take with food. 30 tablet 6   metroNIDAZOLE (METROGEL) 0.75 % gel Apply topically 2 (two) times daily. 90 g 6   Multiple Vitamins-Minerals (MULTIVITAMIN WITH MINERALS) tablet FLORADIX Supplement-Iron +minerals     ondansetron (ZOFRAN) 8 MG tablet Take 8 mg by mouth every 8 (eight) hours as needed for refractory nausea / vomiting, vomiting or nausea.     oxyCODONE ER (XTAMPZA ER) 9 MG C12A Take 1 capsule by mouth every 12 (twelve) hours. 30 capsule 0   Oxycodone HCl 10 MG TABS Take 1 tablet (10 mg total) by mouth every 3 (three) hours as needed ((score 7 to 10)). (Patient taking differently: Take 10 mg by mouth every 3 (three) hours as needed.) 180 tablet 0   polyethylene glycol (MIRALAX / GLYCOLAX) 17 g packet Take 17 g by mouth daily. 30 each 0   potassium chloride SA (KLOR-CON M) 20 MEQ tablet Take 1 tablet by mouth daily. 30 tablet 3   Silver-Carboxymethylcellulose (AQUACEL AG ADVANTAGE) 4"X5" PADS Apply 1 Pad topically in the morning and at bedtime. 60 each 3   No current facility-administered medications for this visit.    VITAL SIGNS: There were no vitals taken for this visit. There were no vitals filed for this visit.   Estimated body mass index is 21.38 kg/m as calculated from the following:   Height as of 08/20/23: 5\' 5"  (1.651 m).   Weight as of  08/20/23: 128 lb 8 oz (58.3 kg).  PERFORMANCE STATUS (ECOG) : 0 - Asymptomatic  Assessment NAD, resting in recliner  Normal breathing pattern AAO x4  IMPRESSION: Misty Arnold presents to clinic today for follow-up.  Continues to do well overall.  Is remaining active.  Denies nausea, vomiting, constipation, or diarrhea.  States she continues to receive PT and home health.  Expresses concerns with her wound as she feels it is not healing properly any longer due to changes in her dressing supplies.  She was previously  using Aquacel Ag and it has become harder to locate this particular dressing despite multiple attempts by herself at home agency.  Encouraged her to continue to look online.  Dr. Georgiann Mohs also wrote a prescription to assist in obtaining with medical supply store. We will try to assist with locating as well.   Appetite continues to be good.  Denies any pain or discomforts.  Some tenderness around wound site however this is manageable with occasional use of Tylenol.  No symptom management at this time.  Patient knows we are available as needed.  PLAN: Pain resolved. Patient denies use of Tylenol, Oxycodone, or other pain relievers.  Right breast wound. ABD pads provided.  Palliative will plan to see her back in 6-8 weeks.  Patient knows to contact office sooner if needed.  Patient expressed understanding and was in agreement with this plan. She also understands that she can call the clinic at any time with any questions, concerns, or complaints.   Visit consisted of counseling and education dealing with the complex and emotionally intense issues of symptom management and palliative care in the setting of serious and potentially life-threatening illness.Greater than 50%  of this time was spent counseling and coordinating care related to the above assessment and plan.    Willette Alma, AGPCNP-BC  Palliative Medicine Team/[facility]

## 2023-09-03 ENCOUNTER — Other Ambulatory Visit: Payer: Medicare PPO

## 2023-09-03 ENCOUNTER — Ambulatory Visit: Payer: Medicare PPO | Admitting: Hematology and Oncology

## 2023-09-03 ENCOUNTER — Inpatient Hospital Stay: Payer: Medicare PPO | Admitting: Nurse Practitioner

## 2023-09-04 ENCOUNTER — Other Ambulatory Visit: Payer: Self-pay

## 2023-09-05 ENCOUNTER — Other Ambulatory Visit: Payer: Self-pay | Admitting: Nurse Practitioner

## 2023-09-05 ENCOUNTER — Other Ambulatory Visit (HOSPITAL_COMMUNITY): Payer: Self-pay

## 2023-09-05 ENCOUNTER — Other Ambulatory Visit: Payer: Self-pay

## 2023-09-05 DIAGNOSIS — G893 Neoplasm related pain (acute) (chronic): Secondary | ICD-10-CM

## 2023-09-05 DIAGNOSIS — Z515 Encounter for palliative care: Secondary | ICD-10-CM

## 2023-09-05 DIAGNOSIS — C50919 Malignant neoplasm of unspecified site of unspecified female breast: Secondary | ICD-10-CM

## 2023-09-05 MED ORDER — XTAMPZA ER 9 MG PO C12A
9.0000 mg | EXTENDED_RELEASE_CAPSULE | Freq: Two times a day (BID) | ORAL | 0 refills | Status: DC
  Filled 2023-09-05: qty 60, 30d supply, fill #0

## 2023-09-05 MED ORDER — XTAMPZA ER 9 MG PO C12A
9.0000 mg | EXTENDED_RELEASE_CAPSULE | Freq: Two times a day (BID) | ORAL | 0 refills | Status: DC
  Filled 2023-09-05: qty 30, 15d supply, fill #0

## 2023-09-07 ENCOUNTER — Other Ambulatory Visit (HOSPITAL_COMMUNITY): Payer: Self-pay

## 2023-09-16 NOTE — Progress Notes (Unsigned)
Palliative Medicine [facility]  Telephone:(336) [phone] Fax:(336) [phone]   Name: Misty Arnold Date: 09/16/2023 MRN: [id number]  DOB: April 11, 1971  Patient Care Team: Serena Croissant, MD as PCP - General (Hematology and Oncology)    INTERVAL HISTORY: Misty Arnold is a 52 y.o. female with right metastatic breast cancer s/p mastectomy, large right chest wound secondary to necrotic mass, weankess due to T9 vertebral compression s/p tumor resection and decompressive laminectomy, and hypertension. Patient did not receive treatment for some time after seeking care in [place]. Currently receiving Enhertu treatment. Palliative following for symptom management and goals of care.   SOCIAL HISTORY:    Misty Arnold reports that she has never smoked. She has never used smokeless tobacco. She reports that she does not currently use alcohol. She reports that she does not use drugs.  ADVANCE DIRECTIVES:  Advance directives completed during recent hospitalization 08/27/2021.  Documents are on file and have been reviewed and [name].  CODE STATUS: Full code  PAST MEDICAL HISTORY: Past Medical History:  Diagnosis Date   Abnormal uterine bleeding    Anemia    Breast cancer (HCC)    Depression    Fibroid    Hormone disorder    Hypertension    Infertility, female    ALLERGIES:  is allergic to amoxicillin, penicillins, pork-derived products, penicillin g, tamoxifen, and tramadol.  MEDICATIONS:  Current Outpatient Medications  Medication Sig Dispense Refill   atenolol (TENORMIN) 50 MG tablet Take 1 tablet (50 mg total) by mouth 2 (two) times daily. 180 tablet 3   celecoxib (CELEBREX) 200 MG capsule Take 1 capsule (200 mg total) by mouth at bedtime. 30 capsule 6   Cholecalciferol (VITAMIN D3) 125 MCG (5000 UT) CAPS Take 1 capsule (5,000 Units total) by mouth daily. 30 capsule 3   diazepam (VALIUM) 5 MG tablet Take 1 tablet (5 mg total) by mouth every 6 (six) hours as needed for  muscle spasms. 30 tablet 3   elacestrant hydrochloride (ORSERDU) 345 MG tablet Take 1 tablet (345 mg total) by mouth daily. Take with food. 30 tablet 6   metroNIDAZOLE (METROGEL) 0.75 % gel Apply topically 2 (two) times daily. 90 g 6   Multiple Vitamins-Minerals (MULTIVITAMIN WITH MINERALS) tablet FLORADIX Supplement-Iron +minerals     ondansetron (ZOFRAN) 8 MG tablet Take 8 mg by mouth every 8 (eight) hours as needed for refractory nausea / vomiting, vomiting or nausea.     oxyCODONE ER (XTAMPZA ER) 9 MG C12A Take 1 capsule by mouth every 12 (twelve) hours. 60 capsule 0   Oxycodone HCl 10 MG TABS Take 1 tablet (10 mg total) by mouth every 3 (three) hours as needed ((score 7 to 10)). (Patient taking differently: Take 10 mg by mouth every 3 (three) hours as needed.) 180 tablet 0   polyethylene glycol (MIRALAX / GLYCOLAX) 17 g packet Take 17 g by mouth daily. 30 each 0   potassium chloride SA (KLOR-CON M) 20 MEQ tablet Take 1 tablet by mouth daily. 30 tablet 3   Silver-Carboxymethylcellulose (AQUACEL AG ADVANTAGE) 4"X5" PADS Apply 1 Pad topically in the morning and at bedtime. 60 each 3   No current facility-administered medications for this visit.    VITAL SIGNS: There were no vitals taken for this visit. There were no vitals filed for this visit.   Estimated body mass index is 21.38 kg/m as calculated from the following:   Height as of 08/20/23: 5\' 5"  (1.651 m).   Weight as of  08/20/23: 128 lb 8 oz (58.3 kg).  PERFORMANCE STATUS (ECOG) : 0 - Asymptomatic  Assessment NAD, resting in recliner  Normal breathing pattern AAO x4  IMPRESSION: Misty Arnold presents to clinic today for follow-up.  Continues to do well overall.  Is remaining active.  Denies nausea, vomiting, constipation, or diarrhea.  States she continues to receive PT and home health.  Expresses concerns with her wound as she feels it is not healing properly any longer due to changes in her dressing supplies.  She was previously  using Aquacel Ag and it has become harder to locate this particular dressing despite multiple attempts by herself at home agency.  Encouraged her to continue to look online.  Dr. Georgiann Mohs also wrote a prescription to assist in obtaining with medical supply store. We will try to assist with locating as well.   Appetite continues to be good.  Denies any pain or discomforts.  Some tenderness around wound site however this is manageable with occasional use of Tylenol.  No symptom management at this time.  Patient knows we are available as needed.  PLAN: Pain resolved. Patient denies use of Tylenol, Oxycodone, or other pain relievers.  Right breast wound. ABD pads provided.  Palliative will plan to see her back in 6-8 weeks.  Patient knows to contact office sooner if needed.  Patient expressed understanding and was in agreement with this plan. She also understands that she can call the clinic at any time with any questions, concerns, or complaints.   Visit consisted of counseling and education dealing with the complex and emotionally intense issues of symptom management and palliative care in the setting of serious and potentially life-threatening illness.Greater than 50%  of this time was spent counseling and coordinating care related to the above assessment and plan.    Willette Alma, AGPCNP-BC  Palliative Medicine Team/[facility]

## 2023-09-19 ENCOUNTER — Encounter: Payer: Self-pay | Admitting: Nurse Practitioner

## 2023-09-19 ENCOUNTER — Other Ambulatory Visit: Payer: Self-pay | Admitting: *Deleted

## 2023-09-19 ENCOUNTER — Inpatient Hospital Stay: Payer: Medicare PPO

## 2023-09-19 ENCOUNTER — Inpatient Hospital Stay (HOSPITAL_BASED_OUTPATIENT_CLINIC_OR_DEPARTMENT_OTHER): Payer: Medicare PPO | Admitting: Hematology and Oncology

## 2023-09-19 ENCOUNTER — Inpatient Hospital Stay: Payer: Medicare PPO | Attending: Hematology and Oncology

## 2023-09-19 ENCOUNTER — Inpatient Hospital Stay (HOSPITAL_BASED_OUTPATIENT_CLINIC_OR_DEPARTMENT_OTHER): Payer: Medicare PPO | Admitting: Nurse Practitioner

## 2023-09-19 ENCOUNTER — Other Ambulatory Visit (HOSPITAL_COMMUNITY): Payer: Self-pay

## 2023-09-19 VITALS — BP 155/91 | HR 94 | Temp 97.8°F | Resp 18 | Ht 65.0 in | Wt 116.2 lb

## 2023-09-19 DIAGNOSIS — Z923 Personal history of irradiation: Secondary | ICD-10-CM | POA: Diagnosis not present

## 2023-09-19 DIAGNOSIS — Z170 Estrogen receptor positive status [ER+]: Secondary | ICD-10-CM

## 2023-09-19 DIAGNOSIS — R634 Abnormal weight loss: Secondary | ICD-10-CM | POA: Diagnosis not present

## 2023-09-19 DIAGNOSIS — C7951 Secondary malignant neoplasm of bone: Secondary | ICD-10-CM | POA: Insufficient documentation

## 2023-09-19 DIAGNOSIS — Z9011 Acquired absence of right breast and nipple: Secondary | ICD-10-CM | POA: Insufficient documentation

## 2023-09-19 DIAGNOSIS — G893 Neoplasm related pain (acute) (chronic): Secondary | ICD-10-CM | POA: Diagnosis not present

## 2023-09-19 DIAGNOSIS — Z79899 Other long term (current) drug therapy: Secondary | ICD-10-CM | POA: Insufficient documentation

## 2023-09-19 DIAGNOSIS — Z9221 Personal history of antineoplastic chemotherapy: Secondary | ICD-10-CM | POA: Insufficient documentation

## 2023-09-19 DIAGNOSIS — R630 Anorexia: Secondary | ICD-10-CM | POA: Diagnosis not present

## 2023-09-19 DIAGNOSIS — Z853 Personal history of malignant neoplasm of breast: Secondary | ICD-10-CM | POA: Insufficient documentation

## 2023-09-19 DIAGNOSIS — C50411 Malignant neoplasm of upper-outer quadrant of right female breast: Secondary | ICD-10-CM | POA: Diagnosis not present

## 2023-09-19 DIAGNOSIS — Z79810 Long term (current) use of selective estrogen receptor modulators (SERMs): Secondary | ICD-10-CM | POA: Diagnosis not present

## 2023-09-19 DIAGNOSIS — Z515 Encounter for palliative care: Secondary | ICD-10-CM | POA: Diagnosis not present

## 2023-09-19 DIAGNOSIS — R530 Neoplastic (malignant) related fatigue: Secondary | ICD-10-CM | POA: Diagnosis not present

## 2023-09-19 DIAGNOSIS — T148XXA Other injury of unspecified body region, initial encounter: Secondary | ICD-10-CM

## 2023-09-19 DIAGNOSIS — C50919 Malignant neoplasm of unspecified site of unspecified female breast: Secondary | ICD-10-CM | POA: Diagnosis not present

## 2023-09-19 LAB — CBC WITH DIFFERENTIAL (CANCER CENTER ONLY)
Abs Immature Granulocytes: 0.01 10*3/uL (ref 0.00–0.07)
Basophils Absolute: 0.1 10*3/uL (ref 0.0–0.1)
Basophils Relative: 1 %
Eosinophils Absolute: 0 10*3/uL (ref 0.0–0.5)
Eosinophils Relative: 1 %
HCT: 36.1 % (ref 36.0–46.0)
Hemoglobin: 12.1 g/dL (ref 12.0–15.0)
Immature Granulocytes: 0 %
Lymphocytes Relative: 25 %
Lymphs Abs: 1.4 10*3/uL (ref 0.7–4.0)
MCH: 29.7 pg (ref 26.0–34.0)
MCHC: 33.5 g/dL (ref 30.0–36.0)
MCV: 88.5 fL (ref 80.0–100.0)
Monocytes Absolute: 0.4 10*3/uL (ref 0.1–1.0)
Monocytes Relative: 7 %
Neutro Abs: 3.7 10*3/uL (ref 1.7–7.7)
Neutrophils Relative %: 66 %
Platelet Count: 335 10*3/uL (ref 150–400)
RBC: 4.08 MIL/uL (ref 3.87–5.11)
RDW: 13.3 % (ref 11.5–15.5)
WBC Count: 5.5 10*3/uL (ref 4.0–10.5)
nRBC: 0 % (ref 0.0–0.2)

## 2023-09-19 LAB — CMP (CANCER CENTER ONLY)
ALT: 14 U/L (ref 0–44)
AST: 27 U/L (ref 15–41)
Albumin: 3.9 g/dL (ref 3.5–5.0)
Alkaline Phosphatase: 83 U/L (ref 38–126)
Anion gap: 9 (ref 5–15)
BUN: 13 mg/dL (ref 6–20)
CO2: 31 mmol/L (ref 22–32)
Calcium: 10 mg/dL (ref 8.9–10.3)
Chloride: 103 mmol/L (ref 98–111)
Creatinine: 0.88 mg/dL (ref 0.44–1.00)
GFR, Estimated: >60 mL/min (ref >60)
Glucose, Bld: 127 mg/dL — ABNORMAL HIGH (ref 70–99)
Potassium: 3.4 mmol/L — ABNORMAL LOW (ref 3.5–5.1)
Sodium: 143 mmol/L (ref 135–145)
Total Bilirubin: 0.4 mg/dL (ref <1.2)
Total Protein: 7.5 g/dL (ref 6.5–8.1)

## 2023-09-19 MED ORDER — MEGESTROL ACETATE 625 MG/5ML PO SUSP
625.0000 mg | Freq: Every day | ORAL | 3 refills | Status: DC
  Filled 2023-09-19: qty 150, 30d supply, fill #0

## 2023-09-19 MED ORDER — METRONIDAZOLE 0.75 % EX GEL
Freq: Two times a day (BID) | CUTANEOUS | 6 refills | Status: DC
  Filled 2023-09-19: qty 90, fill #0

## 2023-09-19 NOTE — Assessment & Plan Note (Signed)
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   Treatment summary: 1. Right mastectomy 08/29/2015 at [facility] (Dr.Singh): IDC with invol of skin and skin ulceration, breast capsule inv cancer, superior medial margin positive, 1/1 LN positive, grade 3, 14.3 cm, 3.5 cm, ALI, chest wall involv, ER 90-100%, PR 80-90%, HER-2 negative, Ki 67 40-50% T4CN1 (St 3B) 2. Patient started tamoxifen but developed severe headache with severe hypertension and tamoxifen was discontinued 03/17/2016.  3. 02/2017 started on Exemestane, Zoladex started 05/29/2017 4.  Hospitalization for paraplegia: T9 vertebral body compression status post decompressive laminectomy and tumor resection 01/04/2020 5.  Palliative radiation to the thoracic spine 01/28/2020 6.  Ibrance with letrozole started April 2021 discontinued 07/24/2021 7.  Enhertu started 10/17/2021 every 3 weeks discontinued May 2024 due to progression 8.  Elacestrant 04/10/2023-09/19/2023 ----------------------------------------------------------------------------------------------------------------- CT chest 03/10/2023: Progressive osseous and chest wall metastasis (sternal mass 6.7 cm previously 6.2, manubrium 2.4 cm previously 1.5 cm, right posterior ninth rib 4.2 cm was previously 3.6 cm, right chest wall mass 6.4 cm was previously 6.1 cm, adjacent 4.6 cm chest wall mass previously 4.1 cm   Treatment plan: Elacestrant (patient is postmenopausal) started 04/10/2023 Toxicities: Fatigue Vertigo: Brain MRI negative Wound care issues: Patient has home health.  I sent prescription for her supplies.  CT CAP 09/13/2023: Significant progression of disease.  Increase in the chest wall masses in size and  number (6.7 cm 4.7 cm, 8.9 cm was 2.5 x 4.2 cm , 7.6 cm used to be 4.8 cm, 6.3 cm was 4.5 cm), increasing right pleural effusion increasing prevascular lymph node and bilateral axillary lymph nodes, right supraclavicular and right hemipelvis.  Increasing edema in the posterior mediastinum increased size of liver metastases) 3.9 cm used to be 2.1 cm)  Guardant360 will be obtained today to determine the next treatment plan

## 2023-09-20 ENCOUNTER — Other Ambulatory Visit (HOSPITAL_COMMUNITY): Payer: Self-pay

## 2023-09-20 LAB — FOLLICLE STIMULATING HORMONE: FSH: 67.5 m[IU]/mL

## 2023-09-20 LAB — PROGESTERONE: Progesterone: 0.8 ng/mL

## 2023-09-20 NOTE — Progress Notes (Signed)
Patient Care Team: Serena Croissant, MD as PCP - General (Hematology and Oncology)  DIAGNOSIS:  Encounter Diagnosis  Name Primary?   Malignant neoplasm of upper-outer quadrant of right breast in female, estrogen receptor positive (HCC) Yes    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; patient went to cancer treatment centers of [place] for second opinion and delayed therapy   01/26/2011 Surgery   right mastectomy followed by reconstruction: Invasive ductal carcinoma T1 N1 MIC M0 stage IB ER/PR positive HER-2 negative, BRCA negative, Oncotype DX low risk   05/29/2012 Procedure   right chest wall nodule excision done on 06/24/2012 showed metastatic carcinoma margins were positive, but CT scan no metastatic disease, recommended chemotherapy but patient refused also refused reexcision   04/28/2013 Treatment Plan Change   patient went to [place] for alternative treatments and took herbal medications, tonics etc. But she could not afford these trips.   01/08/2014 Breast MRI   right breast multiple enhancing masses within the soft tissues largest 5.6 cm in wall skin surface and the capsule of the silicone prosthesis, contiguous nodules involving in inferomedial breast and tired and subcentimeter nodules across the midline   11/02/2014 Cancer Staging   Staging form: Breast, AJCC 7th Edition - Clinical: Stage IV (T4b, N0, M1) - Signed by Serena Croissant, MD on 12/18/2022   08/29/2015 Surgery   Right mastectomy: IDC with invol of skin and skin ulceration, breast capsule inv cancer, superior medial margin positive, 1/1 LN positive, grade 3, 14.3 cm, 3.5 cm, ALI, chest wall involv, ER  90-100%, PR 80-90%, HER-2 negative, Ki 67 40-50% T4CN1 (St 3B)   03/05/2016 -  Anti-estrogen oral therapy   Tamoxifen 20 mg daily stopped due to headache and uncontrolled hypertension. Decrease to 10 mg daily 04/03/2016, stopped June 2017 and took an estrogen metabolizer over-the-counter; started Aromasin April 2018 from a [place] physician, Zoladex started on 05/2017 and switched to Letrozole in 09/2017   01/04/2020 Surgery   Patient presented to the ED on 01/04/20 for worsening lower extremity numbness and underwent a decompressive laminectomy with tumor resection at T9 that was complicated with acute blood loss anemia and leukocytosis.   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)  01/04/2020 Initial Diagnosis   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)   10/17/2021 - 06/06/2022 Chemotherapy   Patient is on Treatment Plan : BREAST METASTATIC fam-trastuzumab deruxtecan-nxki (Enhertu) q21d     10/17/2021 - 03/12/2023 Chemotherapy   Patient is on Treatment Plan : BREAST METASTATIC Fam-Trastuzumab Deruxtecan-nxki (Enhertu) (5.4) q21d     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)  01/08/2020 Initial Diagnosis   Metastatic breast cancer (  HCC)   10/17/2021 - 06/06/2022 Chemotherapy   Patient is on Treatment Plan : BREAST METASTATIC fam-trastuzumab deruxtecan-nxki (Enhertu) q21d     10/17/2021 - 03/12/2023 Chemotherapy   Patient is on Treatment  Plan : BREAST METASTATIC Fam-Trastuzumab Deruxtecan-nxki (Enhertu) (5.4) q21d       CHIEF COMPLIANT: Follow-up to discuss results of recent CT scans  HISTORY OF PRESENT ILLNESS: History of Present Illness   The patient, with a history of metastatic breast cancer, presents with concerns about recent CT scan results showing progression of her disease. She reports experiencing significant stress since last November, primarily related to her mother's health issues. She notes that stress seems to exacerbate her symptoms and negatively impact her blood pressure, which has been a problem since her early twenties. Despite taking 5mg  of an unspecified blood pressure medication, her readings remain high during periods of stress.  The patient also reports a loss of appetite, leading to a weight loss of approximately 10 pounds. She has previously used cannabis cookies to stimulate her appetite, but expresses interest in other potential solutions. She also mentions a decrease in the size of a lump on her back, but is unsure of the cause.  In addition to her cancer and hypertension, the patient has been dealing with an ear infection, which has caused her to miss some treatments. She expresses interest in potentially returning to a previous chemotherapy regimen, pending the results of a Gardant 360 test.  The patient also mentions a theory about parasitic infections being at the root of many diseases, and asks about potential treatments. She notes that she has been careful about her diet, particularly her red meat intake, due to this belief.         ALLERGIES:  is allergic to amoxicillin, penicillins, pork-derived products, penicillin g, tamoxifen, and tramadol.  MEDICATIONS:  Current Outpatient Medications  Medication Sig Dispense Refill   megestrol (MEGACE ES) 625 MG/5ML suspension Take 5 mLs (625 mg total) by mouth daily. 150 mL 3   atenolol (TENORMIN) 50 MG tablet Take 1 tablet (50 mg total) by mouth 2  (two) times daily. 180 tablet 3   celecoxib (CELEBREX) 200 MG capsule Take 1 capsule (200 mg total) by mouth at bedtime. 30 capsule 6   Cholecalciferol (VITAMIN D3) 125 MCG (5000 UT) CAPS Take 1 capsule (5,000 Units total) by mouth daily. 30 capsule 3   diazepam (VALIUM) 5 MG tablet Take 1 tablet (5 mg total) by mouth every 6 (six) hours as needed for muscle spasms. 30 tablet 3   elacestrant hydrochloride (ORSERDU) 345 MG tablet Take 1 tablet (345 mg total) by mouth daily. Take with food. 30 tablet 6   metroNIDAZOLE (METROGEL) 0.75 % gel Apply topically 2 (two) times daily. 90 g 6   Multiple Vitamins-Minerals (MULTIVITAMIN WITH MINERALS) tablet FLORADIX Supplement-Iron +minerals     ondansetron (ZOFRAN) 8 MG tablet Take 8 mg by mouth every 8 (eight) hours as needed for refractory nausea / vomiting, vomiting or nausea.     oxyCODONE ER (XTAMPZA ER) 9 MG C12A Take 1 capsule by mouth every 12 (twelve) hours. 60 capsule 0   Oxycodone HCl 10 MG TABS Take 1 tablet (10 mg total) by mouth every 3 (three) hours as needed ((score 7 to 10)). (Patient taking differently: Take 10 mg by mouth every 3 (three) hours as needed.) 180 tablet 0   polyethylene glycol (MIRALAX / GLYCOLAX) 17 g packet Take 17 g by mouth daily. 30 each 0   potassium  chloride SA (KLOR-CON M) 20 MEQ tablet Take 1 tablet by mouth daily. 30 tablet 3   Silver-Carboxymethylcellulose (AQUACEL AG ADVANTAGE) 4"X5" PADS Apply 1 Pad topically in the morning and at bedtime. 60 each 3   No current facility-administered medications for this visit.    PHYSICAL EXAMINATION: ECOG PERFORMANCE STATUS: 1 - Symptomatic but completely ambulatory  Vitals:   09/19/23 1151  BP: (!) 155/91  Pulse: 94  Resp: 18  Temp: 97.8 F (36.6 C)  SpO2: 99%   Filed Weights   09/19/23 1151  Weight: 116 lb 3.2 oz (52.7 kg)    Physical Exam   VITALS: BP- 150/91      LABORATORY DATA:  I have reviewed the data as listed    Latest Ref Rng & Units 09/19/2023    11:28 AM 08/19/2023    5:42 AM 08/18/2023    4:42 PM  CMP  Glucose 70 - 99 mg/dL 161  096  045   BUN 6 - 20 mg/dL 13  6  11    Creatinine 0.44 - 1.00 mg/dL 4.09  8.11  9.14   Sodium 135 - 145 mmol/L 143  135  133   Potassium 3.5 - 5.1 mmol/L 3.4  3.5  4.1   Chloride 98 - 111 mmol/L 103  102  98   CO2 22 - 32 mmol/L 31  26  21    Calcium 8.9 - 10.3 mg/dL 78.2  8.8  9.0   Total Protein 6.5 - 8.1 g/dL 7.5   7.5   Total Bilirubin <1.2 mg/dL 0.4   1.1   Alkaline Phos 38 - 126 U/L 83   87   AST 15 - 41 U/L 27   33   ALT 0 - 44 U/L 14   12     Lab Results  Component Value Date   WBC 5.5 09/19/2023   HGB 12.1 09/19/2023   HCT 36.1 09/19/2023   MCV 88.5 09/19/2023   PLT 335 09/19/2023   NEUTROABS 3.7 09/19/2023    ASSESSMENT & PLAN:  Breast cancer of upper-outer quadrant of right female breast (HCC) Recurrent right breast cancer initially DCIS treated with mastectomy followed by reconstruction and later developed chest wall recurrence in 2013 ER/PR positive HER-2 negative, patient underwent alternative therapies in [place] but could not afford the trips and hence she has not been on any breast cancer therapy for a long time.   Treatment summary: 1. Right mastectomy 08/29/2015 at [facility] (Dr.Singh): IDC with invol of skin and skin ulceration, breast capsule inv cancer, superior medial margin positive, 1/1 LN positive, grade 3, 14.3 cm, 3.5 cm, ALI, chest wall involv, ER 90-100%, PR 80-90%, HER-2 negative, Ki 67 40-50% T4CN1 (St 3B) 2. Patient started tamoxifen but developed severe headache with severe hypertension and tamoxifen was discontinued 03/17/2016.  3. 02/2017 started on Exemestane, Zoladex started 05/29/2017 4.  Hospitalization for paraplegia: T9 vertebral body compression status post decompressive laminectomy and tumor resection 01/04/2020 5.  Palliative radiation to the thoracic spine 01/28/2020 6.  Ibrance with letrozole started April 2021 discontinued 07/24/2021 7.  Enhertu started  10/17/2021 every 3 weeks discontinued May 2024 due to progression 8.  Elacestrant 04/10/2023-09/19/2023 ----------------------------------------------------------------------------------------------------------------- CT chest 03/10/2023: Progressive osseous and chest wall metastasis (sternal mass 6.7 cm previously 6.2, manubrium 2.4 cm previously 1.5 cm, right posterior ninth rib 4.2 cm was previously 3.6 cm, right chest wall mass 6.4 cm was previously 6.1 cm, adjacent 4.6 cm chest wall mass previously 4.1 cm   Treatment plan:  Elacestrant (patient is postmenopausal) started 04/10/2023 Toxicities: Fatigue Vertigo: Brain MRI negative Wound care issues: Patient has home health.  I sent prescription for her supplies.  CT CAP 09/13/2023: Significant progression of disease.  Increase in the chest wall masses in size and number (6.7 cm 4.7 cm, 8.9 cm was 2.5 x 4.2 cm , 7.6 cm used to be 4.8 cm, 6.3 cm was 4.5 cm), increasing right pleural effusion increasing prevascular lymph node and bilateral axillary lymph nodes, right supraclavicular and right hemipelvis.  Increasing edema in the posterior mediastinum increased size of liver metastases) 3.9 cm used to be 2.1 cm)  Guardant360 will be obtained today to determine the next treatment plan  Decreased Appetite and Weight Loss Patient reports stress-induced loss of appetite and weight loss of approximately 10 pounds. -Resume Marinol to stimulate appetite.  Chronic Pain Patient reports effective pain control with extended-release oxycodone. -Continue extended-release oxycodone. -Refill prescription as needed (patient to send message when ready for refill).  Follow-up in 2-3 weeks to discuss Gardant360 results and finalize new treatment plan.      No orders of the defined types were placed in this encounter.  The patient has a good understanding of the overall plan. she agrees with it. she will call with any problems that may develop before the next visit  here. Total time spent: 30 mins including face to face time and time spent for planning, charting and co-ordination of care   Tamsen Meek, MD 09/20/23

## 2023-09-23 LAB — ESTRADIOL, ULTRA SENS: Estradiol, Sensitive: 3 pg/mL

## 2023-09-29 DIAGNOSIS — C7949 Secondary malignant neoplasm of other parts of nervous system: Secondary | ICD-10-CM | POA: Diagnosis not present

## 2023-09-29 DIAGNOSIS — C50411 Malignant neoplasm of upper-outer quadrant of right female breast: Secondary | ICD-10-CM | POA: Diagnosis not present

## 2023-10-01 ENCOUNTER — Encounter: Payer: Self-pay | Admitting: Hematology and Oncology

## 2023-10-01 LAB — GUARDANT 360

## 2023-10-04 NOTE — Progress Notes (Signed)
Palliative Medicine [facility]  Telephone:([phone]) [phone] Fax:(336) [phone]   Name: Misty Arnold Date: 10/04/2023 MRN: [id number]  DOB: Sep 09, 1971  Patient Care Team: Serena Croissant, MD as PCP - General (Hematology and Oncology)    INTERVAL HISTORY: Misty Arnold is a 52 y.o. female with right metastatic breast cancer s/p mastectomy, large right chest wound secondary to necrotic mass, weankess due to T9 vertebral compression s/p tumor resection and decompressive laminectomy, and hypertension. Patient did not receive treatment for some time after seeking care in [place]. Currently receiving Enhertu treatment. Palliative following for symptom management and goals of care.   SOCIAL HISTORY:    Misty Arnold reports that she has never smoked. She has never used smokeless tobacco. She reports that she does not currently use alcohol. She reports that she does not use drugs.  ADVANCE DIRECTIVES:  Advance directives completed during recent hospitalization 08/27/2021.  Documents are on file and have been reviewed and [name].  CODE STATUS: Full code  PAST MEDICAL HISTORY: Past Medical History:  Diagnosis Date   Abnormal uterine bleeding    Anemia    Breast cancer (HCC)    Depression    Fibroid    Hormone disorder    Hypertension    Infertility, female    ALLERGIES:  is allergic to amoxicillin, penicillins, pork-derived products, penicillin g, tamoxifen, and tramadol.  MEDICATIONS:  Current Outpatient Medications  Medication Sig Dispense Refill   atenolol (TENORMIN) 50 MG tablet Take 1 tablet (50 mg total) by mouth 2 (two) times daily. 180 tablet 3   celecoxib (CELEBREX) 200 MG capsule Take 1 capsule (200 mg total) by mouth at bedtime. 30 capsule 6   Cholecalciferol (VITAMIN D3) 125 MCG (5000 UT) CAPS Take 1 capsule (5,000 Units total) by mouth daily. 30 capsule 3   diazepam (VALIUM) 5 MG tablet Take 1 tablet (5 mg total) by mouth every 6 (six) hours as needed for  muscle spasms. 30 tablet 3   elacestrant hydrochloride (ORSERDU) 345 MG tablet Take 1 tablet (345 mg total) by mouth daily. Take with food. 30 tablet 6   megestrol (MEGACE ES) 625 MG/5ML suspension Take 5 mLs (625 mg total) by mouth daily. 150 mL 3   metroNIDAZOLE (METROGEL) 0.75 % gel Apply topically 2 (two) times daily. 90 g 6   Multiple Vitamins-Minerals (MULTIVITAMIN WITH MINERALS) tablet FLORADIX Supplement-Iron +minerals     ondansetron (ZOFRAN) 8 MG tablet Take 8 mg by mouth every 8 (eight) hours as needed for refractory nausea / vomiting, vomiting or nausea.     oxyCODONE ER (XTAMPZA ER) 9 MG C12A Take 1 capsule by mouth every 12 (twelve) hours. 60 capsule 0   Oxycodone HCl 10 MG TABS Take 1 tablet (10 mg total) by mouth every 3 (three) hours as needed ((score 7 to 10)). (Patient taking differently: Take 10 mg by mouth every 3 (three) hours as needed.) 180 tablet 0   polyethylene glycol (MIRALAX / GLYCOLAX) 17 g packet Take 17 g by mouth daily. 30 each 0   potassium chloride SA (KLOR-CON M) 20 MEQ tablet Take 1 tablet by mouth daily. 30 tablet 3   Silver-Carboxymethylcellulose (AQUACEL AG ADVANTAGE) 4"X5" PADS Apply 1 Pad topically in the morning and at bedtime. 60 each 3   No current facility-administered medications for this visit.    VITAL SIGNS: There were no vitals taken for this visit. There were no vitals filed for this visit.   Estimated body mass index is 19.34 kg/m  as calculated from the following:   Height as of 09/19/23: 5\' 5"  (1.651 m).   Weight as of 09/19/23: 116 lb 3.2 oz (52.7 kg).  PERFORMANCE STATUS (ECOG) : 1 - Symptomatic but completely ambulatory  Assessment NAD Normal breathing pattern Right open chest wound with small nodules  AAO x4  Discussed the use of AI scribe software for clinical note transcription with the patient, who gave verbal consent to proceed.   IMPRESSION: History of Present Illness Misty Arnold presents to clinic for follow-up. Denies  nausea, vomiting, constipation, or diarrhea. Is taking things one day at a time. She was seen by Dr. Pamelia Hoit today. Patient shares that her cancer appears to be progressing, with the development of multiple small tumors. She is considering starting immunotherapy, as suggested by her oncologist. She also reports her chest wound is worsening with small surrounding tumors. She is managing herself in the home.   She is appreciative of her weight gain, noting an increase from 116 to 121 pounds. She reports that the megace,, has been effective in managing her appetite, to the point where she occasionally skips doses on days when she feels well. However, she notes that symptoms of decreased appetite return after missing two consecutive doses.  Misty Arnold endorses ongoing pain, which she attributes to the cold weather. She describes the pain as being particularly noticeable in the area of her surgical screws and chest wall. The pain is managed with extended-release medication, but during colder days, she occasionally needs to take doses of breakthrough medication. She reports her pain is well controlled on current regimen. No changes needed at this time.   Emotionally, the patient describes feeling stressed and overwhelmed, often feeling the need to care for others despite her own health challenges. She expresses a desire for counseling and possibly a change in living situation to reduce stress and focus on her own health. Misty Arnold desires to relocate closer to the hospital in the near future. She is currently taking valium as needed and feels this is helpful. We discussed possibility of use antidepressant if needed. She would like to think about it and will let the team know. Patient is open to speaking with counselor and supportive services as she would like to speak openly about her feelings. She shares that she is tired and have been "fighting" for years. I created space and opportunity for patient to express her  thoughts and feelings while providing emotional support. She is tearful expressing her thoughts.  She also expresses interest in starting a small home-based business to alleviate financial stress.  PLAN:  Breast Cancer Multiple new small tumors noted. Discussed potential for immunotherapy due to resistance to current treatment. Patient to decide on initiation of new therapy. -Consider immunotherapy as suggested by Dr. Pamelia Hoit.  Cancer Related Pain Management Pain exacerbated by cold weather, managed with extended-release medication and oxycodone as needed. -Continue current pain management regimen. -Xtampza 9mg  every 12 hours -Oxycodone 10mg  as needed for breakthrough pain -Celebrex 200mg  at bedtime -Valium as needed  Wound Care Large wound requiring wound care application. Current supply insufficient. -Ensure adequate supply of wound gel is available.  Mental Health Patient reports emotional distress, feelings of being overwhelmed, and occasional thoughts about living/fatigue in health journey. -Refer to social worker/counselor for support and therapy. -Consider initiation of Cymbalta or Lexapro for mood stabilization and anxiety, if patient agrees.  Weight Management Patient reports weight gain, currently at 121 lbs. -Monitor weight and adjust treatment as necessary.  Medication Compliance Patient  reports occasional missed doses of medication due to hunger. -Encourage consistent medication use.  Follow-up Patient to contact provider with decision on immunotherapy and to confirm next appointment.  Patient expressed understanding and was in agreement with this plan. She also understands that she can call the clinic at any time with any questions, concerns, or complaints.   Visit consisted of counseling and education dealing with the complex and emotionally intense issues of symptom management and palliative care in the setting of serious and potentially life-threatening  illness.  Willette Alma, AGPCNP-BC  Palliative Medicine Team/[facility]

## 2023-10-09 DIAGNOSIS — T148XXA Other injury of unspecified body region, initial encounter: Secondary | ICD-10-CM | POA: Diagnosis not present

## 2023-10-09 DIAGNOSIS — C50411 Malignant neoplasm of upper-outer quadrant of right female breast: Secondary | ICD-10-CM | POA: Diagnosis not present

## 2023-10-09 DIAGNOSIS — L089 Local infection of the skin and subcutaneous tissue, unspecified: Secondary | ICD-10-CM | POA: Diagnosis not present

## 2023-10-09 DIAGNOSIS — T8189XA Other complications of procedures, not elsewhere classified, initial encounter: Secondary | ICD-10-CM | POA: Diagnosis not present

## 2023-10-09 DIAGNOSIS — C50919 Malignant neoplasm of unspecified site of unspecified female breast: Secondary | ICD-10-CM | POA: Diagnosis not present

## 2023-10-09 NOTE — Assessment & Plan Note (Signed)
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   Treatment summary: 1. Right mastectomy 08/29/2015 at [facility] (Dr.Singh): IDC with invol of skin and skin ulceration, breast capsule inv cancer, superior medial margin positive, 1/1 LN positive, grade 3, 14.3 cm, 3.5 cm, ALI, chest wall involv, ER 90-100%, PR 80-90%, HER-2 negative, Ki 67 40-50% T4CN1 (St 3B) 2. Patient started tamoxifen but developed severe headache with severe hypertension and tamoxifen was discontinued 03/17/2016.  3. 02/2017 started on Exemestane, Zoladex started 05/29/2017 4.  Hospitalization for paraplegia: T9 vertebral body compression status post decompressive laminectomy and tumor resection 01/04/2020 5.  Palliative radiation to the thoracic spine 01/28/2020 6.  Ibrance with letrozole started April 2021 discontinued 07/24/2021 7.  Enhertu started 10/17/2021 every 3 weeks discontinued May 2024 due to progression 8.  Elacestrant 04/10/2023-09/19/2023 ----------------------------------------------------------------------------------------------------------------- CT chest 03/10/2023: Progressive osseous and chest wall metastasis (sternal mass 6.7 cm previously 6.2, manubrium 2.4 cm previously 1.5 cm, right posterior ninth rib 4.2 cm was previously 3.6 cm, right chest wall mass 6.4 cm was previously 6.1 cm, adjacent 4.6 cm chest wall mass previously 4.1 cm   Treatment plan: Elacestrant (patient is postmenopausal) started 04/10/2023 Toxicities: Fatigue Vertigo: Brain MRI negative Wound care issues: Patient has home health.  I sent prescription for her supplies.   CT CAP 09/13/2023: Significant progression of disease.  Increase in the chest wall masses in size and  number (6.7 cm 4.7 cm, 8.9 cm was 2.5 x 4.2 cm , 7.6 cm used to be 4.8 cm, 6.3 cm was 4.5 cm), increasing right pleural effusion increasing prevascular lymph node and bilateral axillary lymph nodes, right supraclavicular and right hemipelvis.  Increasing edema in the posterior mediastinum increased size of liver metastases) 3.9 cm used to be 2.1 cm)   Guardant360 09/29/2023: ESR 1 mutation, T p53, FGF R1 amplification, GA TA 3, ATRX splice site SNV were positive.  TMB: 11.39, MSI high not detected  Treatment plan: Immunotherapy with Keytruda (TMB high) We discussed the risks and benefits of immunotherapy and she is willing to proceed.

## 2023-10-11 ENCOUNTER — Inpatient Hospital Stay: Payer: Medicare PPO | Attending: Hematology and Oncology | Admitting: Hematology and Oncology

## 2023-10-11 ENCOUNTER — Encounter: Payer: Self-pay | Admitting: *Deleted

## 2023-10-11 ENCOUNTER — Encounter: Payer: Self-pay | Admitting: Nurse Practitioner

## 2023-10-11 ENCOUNTER — Other Ambulatory Visit (HOSPITAL_COMMUNITY): Payer: Self-pay

## 2023-10-11 ENCOUNTER — Other Ambulatory Visit: Payer: Self-pay

## 2023-10-11 ENCOUNTER — Inpatient Hospital Stay (HOSPITAL_BASED_OUTPATIENT_CLINIC_OR_DEPARTMENT_OTHER): Payer: Medicare PPO | Admitting: Nurse Practitioner

## 2023-10-11 VITALS — BP 130/79 | HR 106 | Temp 98.4°F | Resp 17 | Wt 121.0 lb

## 2023-10-11 DIAGNOSIS — Z515 Encounter for palliative care: Secondary | ICD-10-CM | POA: Diagnosis not present

## 2023-10-11 DIAGNOSIS — Z7189 Other specified counseling: Secondary | ICD-10-CM | POA: Diagnosis not present

## 2023-10-11 DIAGNOSIS — R630 Anorexia: Secondary | ICD-10-CM

## 2023-10-11 DIAGNOSIS — C50411 Malignant neoplasm of upper-outer quadrant of right female breast: Secondary | ICD-10-CM | POA: Insufficient documentation

## 2023-10-11 DIAGNOSIS — T148XXA Other injury of unspecified body region, initial encounter: Secondary | ICD-10-CM

## 2023-10-11 DIAGNOSIS — C50919 Malignant neoplasm of unspecified site of unspecified female breast: Secondary | ICD-10-CM | POA: Diagnosis not present

## 2023-10-11 DIAGNOSIS — Z170 Estrogen receptor positive status [ER+]: Secondary | ICD-10-CM | POA: Insufficient documentation

## 2023-10-11 DIAGNOSIS — Z9011 Acquired absence of right breast and nipple: Secondary | ICD-10-CM | POA: Insufficient documentation

## 2023-10-11 DIAGNOSIS — G893 Neoplasm related pain (acute) (chronic): Secondary | ICD-10-CM | POA: Diagnosis not present

## 2023-10-11 DIAGNOSIS — C7951 Secondary malignant neoplasm of bone: Secondary | ICD-10-CM | POA: Insufficient documentation

## 2023-10-11 DIAGNOSIS — C801 Malignant (primary) neoplasm, unspecified: Secondary | ICD-10-CM

## 2023-10-11 DIAGNOSIS — R530 Neoplastic (malignant) related fatigue: Secondary | ICD-10-CM | POA: Diagnosis not present

## 2023-10-11 DIAGNOSIS — Z79899 Other long term (current) drug therapy: Secondary | ICD-10-CM | POA: Diagnosis not present

## 2023-10-11 DIAGNOSIS — F419 Anxiety disorder, unspecified: Secondary | ICD-10-CM | POA: Diagnosis not present

## 2023-10-11 DIAGNOSIS — Z79810 Long term (current) use of selective estrogen receptor modulators (SERMs): Secondary | ICD-10-CM | POA: Diagnosis not present

## 2023-10-11 MED ORDER — DIAZEPAM 5 MG PO TABS
5.0000 mg | ORAL_TABLET | Freq: Four times a day (QID) | ORAL | 3 refills | Status: DC | PRN
  Filled 2023-10-11: qty 30, 8d supply, fill #0

## 2023-10-11 MED ORDER — CELECOXIB 200 MG PO CAPS
200.0000 mg | ORAL_CAPSULE | Freq: Every day | ORAL | 6 refills | Status: DC
  Filled 2023-10-11: qty 30, 30d supply, fill #0

## 2023-10-11 MED ORDER — MEGESTROL ACETATE 625 MG/5ML PO SUSP
625.0000 mg | Freq: Every day | ORAL | 3 refills | Status: DC
  Filled 2023-10-11 – 2023-10-18 (×3): qty 150, 30d supply, fill #0

## 2023-10-11 MED ORDER — XTAMPZA ER 9 MG PO C12A
9.0000 mg | EXTENDED_RELEASE_CAPSULE | Freq: Two times a day (BID) | ORAL | 0 refills | Status: DC
  Filled 2023-10-11 (×3): qty 60, 30d supply, fill #0

## 2023-10-11 MED ORDER — METRONIDAZOLE 0.75 % EX GEL
Freq: Two times a day (BID) | CUTANEOUS | 6 refills | Status: DC
  Filled 2023-10-11: qty 45, 30d supply, fill #0

## 2023-10-11 NOTE — Progress Notes (Signed)
Patient Care Team: Serena Croissant, MD as PCP - General (Hematology and Oncology)  DIAGNOSIS:  Encounter Diagnoses  Name Primary?   Malignant neoplasm of upper-outer quadrant of right breast in female, estrogen receptor positive (HCC) Yes   Cancer related pain    Palliative care patient    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)     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; patient went to cancer treatment centers of [place] for second opinion and delayed therapy   01/26/2011 Surgery   right mastectomy followed by reconstruction: Invasive ductal carcinoma T1 N1 MIC M0 stage IB ER/PR positive HER-2 negative, BRCA negative, Oncotype DX low risk   05/29/2012 Procedure   right chest wall nodule excision done on 06/24/2012 showed metastatic carcinoma margins were positive, but CT scan no metastatic disease, recommended chemotherapy but patient refused also refused reexcision   04/28/2013 Treatment Plan Change   patient went to [place] for alternative treatments and took herbal medications, tonics etc. But she could not afford these trips.   01/08/2014 Breast MRI   right breast multiple enhancing masses within the soft tissues largest 5.6 cm in wall skin surface and the capsule of the silicone prosthesis, contiguous nodules involving in inferomedial breast and tired and subcentimeter nodules across the midline   11/02/2014 Cancer Staging   Staging form: Breast, AJCC 7th Edition - Clinical: Stage IV (T4b, N0, M1) - Signed by Serena Croissant, MD on 12/18/2022   08/29/2015 Surgery   Right mastectomy: IDC with invol of skin and skin ulceration, breast capsule inv  cancer, superior medial margin positive, 1/1 LN positive, grade 3, 14.3 cm, 3.5 cm, ALI, chest wall involv, ER 90-100%, PR 80-90%, HER-2 negative, Ki 67 40-50% T4CN1 (St 3B)   03/05/2016 -  Anti-estrogen oral therapy   Tamoxifen 20 mg daily stopped due to headache and uncontrolled hypertension. Decrease to 10 mg daily 04/03/2016, stopped June 2017 and took an estrogen metabolizer over-the-counter; started Aromasin April 2018 from a [place] physician, Zoladex started on 05/2017 and switched to Letrozole in 09/2017   01/04/2020 Surgery   Patient presented to the ED on 01/04/20 for worsening lower extremity numbness and underwent a decompressive laminectomy with tumor resection at T9 that was complicated with acute blood loss anemia and leukocytosis.   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)  01/04/2020 Initial Diagnosis   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)   10/17/2021 - 06/06/2022 Chemotherapy   Patient is on Treatment Plan : BREAST METASTATIC fam-trastuzumab deruxtecan-nxki (Enhertu) q21d     10/17/2021 - 03/12/2023 Chemotherapy   Patient is on Treatment Plan : BREAST METASTATIC Fam-Trastuzumab Deruxtecan-nxki (Enhertu) (  5.4) q21d     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  Her treatment with Elacestrant has been discontinued because of progression of disease on the recent scans.  She underwent Guardant360 and is here to discuss results.    ALLERGIES:  is allergic to amoxicillin, penicillins, pork-derived products, penicillin g, tamoxifen, and tramadol.  MEDICATIONS:  Current Outpatient Medications  Medication Sig Dispense Refill   atenolol (TENORMIN) 50 MG tablet Take 1 tablet (50 mg total) by mouth 2 (two) times daily. 180 tablet 3   celecoxib (CELEBREX) 200 MG capsule Take 1 capsule (200 mg total) by mouth at bedtime. 30 capsule 6   Cholecalciferol (VITAMIN D3) 125 MCG (5000 UT) CAPS Take 1 capsule (5,000 Units total) by mouth daily. 30 capsule 3   diazepam (VALIUM) 5 MG tablet Take 1 tablet (5 mg total) by mouth every 6 (six) hours as needed for muscle spasms. 30 tablet 3   megestrol (MEGACE ES) 625 MG/5ML suspension Take 5 mLs (625 mg total) by mouth daily. 150 mL 3   metroNIDAZOLE (METROGEL) 0.75 % gel Apply topically 2 (two) times daily. 90 g 6   Multiple Vitamins-Minerals (MULTIVITAMIN WITH MINERALS) tablet FLORADIX Supplement-Iron +minerals     ondansetron (ZOFRAN) 8 MG tablet Take 8 mg by mouth every 8 (eight) hours as needed for refractory nausea / vomiting, vomiting or nausea.     oxyCODONE ER (XTAMPZA ER) 9 MG C12A Take 1 capsule by mouth every 12 (twelve) hours. 60 capsule 0   Oxycodone HCl 10 MG TABS Take 1 tablet (10 mg total) by mouth every 3 (three) hours as needed ((score 7 to 10)). (Patient taking differently: Take 10 mg by mouth every  3 (three) hours as needed.) 180 tablet 0   polyethylene glycol (MIRALAX / GLYCOLAX) 17 g packet Take 17 g by mouth daily. 30 each 0   potassium chloride SA (KLOR-CON M) 20 MEQ tablet Take 1 tablet by mouth daily. 30 tablet 3   Silver-Carboxymethylcellulose (AQUACEL AG ADVANTAGE) 4"X5" PADS Apply 1 Pad topically in the morning and at bedtime. 60 each 3   No current facility-administered medications for this visit.    PHYSICAL EXAMINATION: ECOG PERFORMANCE STATUS: 1 - Symptomatic but completely ambulatory  Vitals:   10/11/23 1230  BP: 130/79  Pulse: (!) 106  Resp: 17  Temp: 98.4 F (36.9 C)  SpO2: 99%   Filed Weights   10/11/23 1230  Weight: 121 lb (54.9 kg)      LABORATORY DATA:  I have reviewed the data as listed    Latest Ref Rng & Units 09/19/2023   11:28 AM 08/19/2023    5:42 AM 08/18/2023    4:42 PM  CMP  Glucose 70 - 99 mg/dL 629  528  413   BUN 6 - 20 mg/dL 13  6  11    Creatinine 0.44 - 1.00 mg/dL 2.44  0.10  2.72   Sodium 135 - 145 mmol/L 143  135  133   Potassium 3.5 - 5.1 mmol/L 3.4  3.5  4.1   Chloride 98 - 111 mmol/L 103  102  98   CO2 22 - 32 mmol/L 31  26  21    Calcium 8.9 - 10.3 mg/dL 16.1  8.8  9.0   Total Protein 6.5 - 8.1 g/dL 7.5   7.5   Total Bilirubin <1.2 mg/dL 0.4   1.1   Alkaline Phos 38 - 126 U/L 83   87   AST 15 - 41 U/L 27   33   ALT 0 - 44 U/L 14   12     Lab Results  Component Value Date   WBC 5.5 09/19/2023   HGB 12.1 09/19/2023   HCT 36.1 09/19/2023   MCV 88.5 09/19/2023   PLT 335 09/19/2023   NEUTROABS 3.7 09/19/2023    ASSESSMENT & PLAN:  Breast cancer of upper-outer quadrant of right female breast (HCC) Recurrent right breast cancer initially DCIS treated with mastectomy followed by reconstruction and later developed chest wall recurrence in 2013 ER/PR positive HER-2 negative, patient underwent alternative therapies in [place] but could not afford the trips and hence she has not been on any breast cancer therapy for a long  time.   Treatment summary: 1. Right mastectomy 08/29/2015 at [facility] (Dr.Singh): IDC with invol of skin and skin ulceration, breast capsule inv cancer, superior medial margin positive, 1/1 LN positive, grade 3, 14.3 cm, 3.5 cm, ALI, chest wall involv, ER 90-100%, PR 80-90%, HER-2 negative, Ki 67 40-50% T4CN1 (St 3B) 2. Patient started tamoxifen but developed severe headache with severe hypertension and tamoxifen was discontinued 03/17/2016.  3. 02/2017 started on Exemestane, Zoladex started 05/29/2017 4.  Hospitalization for paraplegia: T9 vertebral body compression status post decompressive laminectomy and tumor resection 01/04/2020 5.  Palliative radiation to the thoracic spine 01/28/2020 6.  Ibrance with letrozole started April 2021 discontinued 07/24/2021 7.  Enhertu started 10/17/2021 every 3 weeks discontinued May 2024 due to progression 8.  Elacestrant 04/10/2023-09/19/2023 ----------------------------------------------------------------------------------------------------------------- CT chest 03/10/2023: Progressive osseous and chest wall metastasis (sternal mass 6.7 cm previously 6.2, manubrium 2.4 cm previously 1.5 cm, right posterior ninth rib 4.2 cm was previously 3.6 cm, right chest wall mass 6.4 cm was previously 6.1 cm, adjacent 4.6 cm chest wall mass previously 4.1 cm   Treatment plan: Elacestrant (patient is postmenopausal) started 04/10/2023 Toxicities: Fatigue Vertigo: Brain MRI negative Wound care issues: Patient has home health.  I sent prescription for her supplies.   CT CAP 09/13/2023: Significant progression of disease.  Increase in the chest wall masses in size and number (6.7 cm 4.7 cm, 8.9 cm was 2.5 x 4.2 cm , 7.6 cm used to be 4.8 cm, 6.3 cm was 4.5 cm), increasing right pleural effusion increasing prevascular lymph node and bilateral axillary lymph nodes, right supraclavicular and right hemipelvis.  Increasing edema in the posterior mediastinum increased size of liver metastases)  3.9 cm used to be 2.1 cm)   Guardant360 09/29/2023: ESR 1 mutation, T p53, FGF R1 amplification, GA TA 3, ATRX splice site SNV were positive.  TMB: 11.39, MSI high not detected  Treatment plan: Immunotherapy with Keytruda (TMB high) We discussed the risks and benefits of immunotherapy and she is going to get back to [name] after starting it.  We will then order it and get it scheduled.  No orders of the defined types were placed in this encounter.  The patient has a good understanding of the overall plan. she  agrees with it. she will call with any problems that may develop before the next visit here. Total time spent: 30 mins including face to face time and time spent for planning, charting and co-ordination of care   Tamsen Meek, MD 10/11/23

## 2023-10-11 NOTE — Progress Notes (Unsigned)
RN successfully faxed wound care supplies to [phone].

## 2023-10-14 ENCOUNTER — Encounter: Payer: Self-pay | Admitting: General Practice

## 2023-10-14 ENCOUNTER — Other Ambulatory Visit (HOSPITAL_COMMUNITY): Payer: Self-pay

## 2023-10-14 DIAGNOSIS — C50919 Malignant neoplasm of unspecified site of unspecified female breast: Secondary | ICD-10-CM | POA: Diagnosis not present

## 2023-10-14 DIAGNOSIS — T148XXA Other injury of unspecified body region, initial encounter: Secondary | ICD-10-CM | POA: Diagnosis not present

## 2023-10-14 DIAGNOSIS — T8189XA Other complications of procedures, not elsewhere classified, initial encounter: Secondary | ICD-10-CM | POA: Diagnosis not present

## 2023-10-14 DIAGNOSIS — L089 Local infection of the skin and subcutaneous tissue, unspecified: Secondary | ICD-10-CM | POA: Diagnosis not present

## 2023-10-14 DIAGNOSIS — C50411 Malignant neoplasm of upper-outer quadrant of right female breast: Secondary | ICD-10-CM | POA: Diagnosis not present

## 2023-10-14 NOTE — Progress Notes (Signed)
[facility] Spiritual Care Note  Referred by Palliative Care via Social Work for additional layer of emotional support. Reached Ms Ciano by phone to introduce Spiritual Care as part of her support team. Built rapport, providing empathic listening, emotional support, and normalization of feelings. Ms Fuhriman reports support from her family and fiance.  She notes that her chronic pain from the screws in her back is worse with rainy and cold weather, which makes the winter in particular more difficult to manage because she has less bandwidth for enjoyable activities and experiences more social isolation; naps can help give her a break from the pain and re-energize her a bit.  Ms Blakey was appreciative of call and plans to phone chaplain again to schedule a follow-up in-person visit alongside other appointments, once she gets them scheduled. She knows to call whenever needed/desired.   [address] [place], [place], [facility] Pager [phone] Voicemail [phone]

## 2023-10-15 ENCOUNTER — Telehealth: Payer: Self-pay | Admitting: Licensed Clinical Social Worker

## 2023-10-15 ENCOUNTER — Other Ambulatory Visit: Payer: Self-pay

## 2023-10-15 NOTE — Telephone Encounter (Signed)
[facility] Clinical Social Work  Clinical Social Work was referred by  palliative care  for assessment of psychosocial needs.  Clinical Social Worker attempted to contact patient by phone to offer support and assess for needs.   No answer. Left VM with direct contact information.     Addalee Kavanagh E Isidro Monks, LCSW  Clinical Social Worker [facility]

## 2023-10-16 ENCOUNTER — Other Ambulatory Visit: Payer: Self-pay

## 2023-10-16 ENCOUNTER — Other Ambulatory Visit (HOSPITAL_COMMUNITY): Payer: Self-pay

## 2023-10-17 ENCOUNTER — Telehealth: Payer: Self-pay | Admitting: Licensed Clinical Social Worker

## 2023-10-17 NOTE — Telephone Encounter (Addendum)
[facility] Clinical Social Work  9:30am- 2nd attempt to contact pt by phone regarding referral from palliative care. Pt answered but not able to talk at this time. Requested call back around 1 or 2pm this afternoon.  1:20pm- attempted to call pt back at this time per her request. No answer. VM full and unable to accept messages.  Referral being closed after 3 attempts to contact. Pt may be re-referred to social work as needed in the future.   Geremy Rister E Chia Rock, LCSW

## 2023-10-18 ENCOUNTER — Other Ambulatory Visit (HOSPITAL_COMMUNITY): Payer: Self-pay

## 2023-10-19 ENCOUNTER — Other Ambulatory Visit (HOSPITAL_COMMUNITY): Payer: Self-pay

## 2023-10-21 ENCOUNTER — Inpatient Hospital Stay: Payer: Medicare PPO | Admitting: Licensed Clinical Social Worker

## 2023-10-21 NOTE — Progress Notes (Signed)
[facility] Clinical Social Work  Clinical Social Work was referred by  palliative care  for assessment of psychosocial needs.  Clinical Social Worker contacted patient by phone to offer support and assess for needs.   Patient is interested again in searching for housing for herself closer to [place], potentially in surrounding towns like [place].  CSW shared resources for housing search and sent to pt via MyChart so she can access them herself. Resources shared show affordable and income-based housing as pt is on fixed income from disability.  No questions or other needs at this time.   Briarrose Shor E Taysha Majewski, LCSW  Clinical Social Worker [facility]

## 2023-11-08 ENCOUNTER — Telehealth: Payer: Self-pay | Admitting: *Deleted

## 2023-11-08 ENCOUNTER — Other Ambulatory Visit: Payer: Self-pay | Admitting: Hematology and Oncology

## 2023-11-08 ENCOUNTER — Encounter: Payer: Self-pay | Admitting: Hematology and Oncology

## 2023-11-08 DIAGNOSIS — Z170 Estrogen receptor positive status [ER+]: Secondary | ICD-10-CM

## 2023-11-08 DIAGNOSIS — C50919 Malignant neoplasm of unspecified site of unspecified female breast: Secondary | ICD-10-CM

## 2023-11-08 DIAGNOSIS — C7949 Secondary malignant neoplasm of other parts of nervous system: Secondary | ICD-10-CM

## 2023-11-08 NOTE — Progress Notes (Signed)
 Keytruda orders placed on behalf of Dr Pamelia Hoit.

## 2023-11-08 NOTE — Telephone Encounter (Signed)
 Received call from pt stating she would like to proceed with Keytruda IV infusion.  MD out of office, ordering provider placed orders.  Message sent to scheduling team and PA team.

## 2023-11-11 ENCOUNTER — Other Ambulatory Visit: Payer: Self-pay | Admitting: Nurse Practitioner

## 2023-11-11 ENCOUNTER — Other Ambulatory Visit: Payer: Self-pay

## 2023-11-11 ENCOUNTER — Other Ambulatory Visit (HOSPITAL_COMMUNITY): Payer: Self-pay

## 2023-11-11 ENCOUNTER — Other Ambulatory Visit (HOSPITAL_BASED_OUTPATIENT_CLINIC_OR_DEPARTMENT_OTHER): Payer: Self-pay

## 2023-11-11 DIAGNOSIS — Z515 Encounter for palliative care: Secondary | ICD-10-CM

## 2023-11-11 DIAGNOSIS — G893 Neoplasm related pain (acute) (chronic): Secondary | ICD-10-CM

## 2023-11-11 DIAGNOSIS — C50919 Malignant neoplasm of unspecified site of unspecified female breast: Secondary | ICD-10-CM

## 2023-11-11 MED ORDER — OXYCODONE HCL 10 MG PO TABS
10.0000 mg | ORAL_TABLET | ORAL | 0 refills | Status: DC | PRN
  Filled 2023-11-11: qty 180, 23d supply, fill #0

## 2023-11-11 MED ORDER — XTAMPZA ER 9 MG PO C12A
9.0000 mg | EXTENDED_RELEASE_CAPSULE | Freq: Two times a day (BID) | ORAL | 0 refills | Status: DC
  Filled 2023-11-11: qty 60, 30d supply, fill #0

## 2023-11-11 NOTE — Telephone Encounter (Signed)
 Called and spoke with patient and scheduled all appointments requested.

## 2023-11-14 ENCOUNTER — Other Ambulatory Visit: Payer: Self-pay | Admitting: *Deleted

## 2023-11-14 ENCOUNTER — Inpatient Hospital Stay: Payer: Medicare PPO

## 2023-11-14 ENCOUNTER — Inpatient Hospital Stay: Payer: Medicare PPO | Attending: Hematology and Oncology | Admitting: Pharmacist

## 2023-11-14 ENCOUNTER — Other Ambulatory Visit (HOSPITAL_COMMUNITY): Payer: Self-pay

## 2023-11-14 ENCOUNTER — Other Ambulatory Visit: Payer: Self-pay | Admitting: Nurse Practitioner

## 2023-11-14 VITALS — BP 130/82 | HR 100 | Resp 16

## 2023-11-14 VITALS — BP 137/88 | HR 98 | Temp 98.1°F | Resp 16 | Ht 65.0 in | Wt 118.9 lb

## 2023-11-14 DIAGNOSIS — Z79899 Other long term (current) drug therapy: Secondary | ICD-10-CM | POA: Insufficient documentation

## 2023-11-14 DIAGNOSIS — C50411 Malignant neoplasm of upper-outer quadrant of right female breast: Secondary | ICD-10-CM | POA: Diagnosis not present

## 2023-11-14 DIAGNOSIS — Z5112 Encounter for antineoplastic immunotherapy: Secondary | ICD-10-CM | POA: Diagnosis not present

## 2023-11-14 DIAGNOSIS — C170 Malignant neoplasm of duodenum: Secondary | ICD-10-CM | POA: Diagnosis not present

## 2023-11-14 DIAGNOSIS — Z170 Estrogen receptor positive status [ER+]: Secondary | ICD-10-CM

## 2023-11-14 DIAGNOSIS — C50919 Malignant neoplasm of unspecified site of unspecified female breast: Secondary | ICD-10-CM

## 2023-11-14 DIAGNOSIS — C7949 Secondary malignant neoplasm of other parts of nervous system: Secondary | ICD-10-CM

## 2023-11-14 DIAGNOSIS — C7951 Secondary malignant neoplasm of bone: Secondary | ICD-10-CM | POA: Insufficient documentation

## 2023-11-14 LAB — CBC WITH DIFFERENTIAL (CANCER CENTER ONLY)
Abs Immature Granulocytes: 0.03 10*3/uL (ref 0.00–0.07)
Basophils Absolute: 0.1 10*3/uL (ref 0.0–0.1)
Basophils Relative: 1 %
Eosinophils Absolute: 0.1 10*3/uL (ref 0.0–0.5)
Eosinophils Relative: 1 %
HCT: 33.9 % — ABNORMAL LOW (ref 36.0–46.0)
Hemoglobin: 11.2 g/dL — ABNORMAL LOW (ref 12.0–15.0)
Immature Granulocytes: 1 %
Lymphocytes Relative: 22 %
Lymphs Abs: 1.4 10*3/uL (ref 0.7–4.0)
MCH: 28.4 pg (ref 26.0–34.0)
MCHC: 33 g/dL (ref 30.0–36.0)
MCV: 86 fL (ref 80.0–100.0)
Monocytes Absolute: 0.5 10*3/uL (ref 0.1–1.0)
Monocytes Relative: 8 %
Neutro Abs: 4.3 10*3/uL (ref 1.7–7.7)
Neutrophils Relative %: 67 %
Platelet Count: 411 10*3/uL — ABNORMAL HIGH (ref 150–400)
RBC: 3.94 MIL/uL (ref 3.87–5.11)
RDW: 14.6 % (ref 11.5–15.5)
WBC Count: 6.2 10*3/uL (ref 4.0–10.5)
nRBC: 0 % (ref 0.0–0.2)

## 2023-11-14 LAB — CMP (CANCER CENTER ONLY)
ALT: 56 U/L — ABNORMAL HIGH (ref 0–44)
AST: 40 U/L (ref 15–41)
Albumin: 3.7 g/dL (ref 3.5–5.0)
Alkaline Phosphatase: 81 U/L (ref 38–126)
Anion gap: 7 (ref 5–15)
BUN: 16 mg/dL (ref 6–20)
CO2: 26 mmol/L (ref 22–32)
Calcium: 10.1 mg/dL (ref 8.9–10.3)
Chloride: 109 mmol/L (ref 98–111)
Creatinine: 1.14 mg/dL — ABNORMAL HIGH (ref 0.44–1.00)
GFR, Estimated: 57 mL/min — ABNORMAL LOW (ref >60)
Glucose, Bld: 170 mg/dL — ABNORMAL HIGH (ref 70–99)
Potassium: 4 mmol/L (ref 3.5–5.1)
Sodium: 142 mmol/L (ref 135–145)
Total Bilirubin: 0.3 mg/dL (ref 0.0–1.2)
Total Protein: 7.3 g/dL (ref 6.5–8.1)

## 2023-11-14 LAB — TSH: TSH: 3.045 u[IU]/mL (ref 0.350–4.500)

## 2023-11-14 MED ORDER — METRONIDAZOLE 0.75 % EX GEL
Freq: Two times a day (BID) | CUTANEOUS | 6 refills | Status: DC
  Filled 2023-11-14: qty 45, 30d supply, fill #0

## 2023-11-14 MED ORDER — SODIUM CHLORIDE 0.9 % IV SOLN: INTRAVENOUS | Status: DC

## 2023-11-14 MED ORDER — MORPHINE SULFATE (PF) 2 MG/ML IV SOLN
2.0000 mg | INTRAVENOUS | Status: AC
  Administered 2023-11-14: 2 mg via INTRAVENOUS
  Filled 2023-11-14: qty 1

## 2023-11-14 MED ORDER — SODIUM CHLORIDE 0.9 % IV SOLN
200.0000 mg | Freq: Once | INTRAVENOUS | Status: AC
  Administered 2023-11-14: 200 mg via INTRAVENOUS
  Filled 2023-11-14: qty 200

## 2023-11-14 MED ORDER — ONDANSETRON HCL 4 MG/2ML IJ SOLN
8.0000 mg | Freq: Once | INTRAMUSCULAR | Status: AC
  Administered 2023-11-14: 8 mg via INTRAVENOUS
  Filled 2023-11-14: qty 4

## 2023-11-14 MED ORDER — ONDANSETRON HCL 8 MG PO TABS
8.0000 mg | ORAL_TABLET | Freq: Three times a day (TID) | ORAL | 1 refills | Status: DC | PRN
  Filled 2023-11-14: qty 30, 10d supply, fill #0

## 2023-11-14 MED ORDER — PROCHLORPERAZINE MALEATE 10 MG PO TABS
10.0000 mg | ORAL_TABLET | Freq: Four times a day (QID) | ORAL | 1 refills | Status: DC | PRN
  Filled 2023-11-14: qty 30, 8d supply, fill #0

## 2023-11-14 NOTE — Progress Notes (Signed)
 [facility]       Telephone: [phone]?Fax: [phone]   Oncology Clinical Pharmacist Practitioner Initial Assessment  Misty Arnold is a 53 y.o. female with a diagnosis of breast cancer. They were contacted today via in-person visit.  Indication/Regimen Pembrolizumab  (Keytruda ) is being used appropriately for treatment of breast cancer by Dr. Vinay Gudena.      Wt Readings from Last 1 Encounters:  10/11/23 121 lb (54.9 kg)    Estimated body surface area is 1.59 meters squared as calculated from the following:   Height as of 09/19/23: 5' 5 (1.651 m).   Weight as of 10/11/23: 121 lb (54.9 kg).  The dosing regimen is every 21 days  200 mg IV on Day 1  Dose Modifications None   Allergies Allergies  Allergen Reactions   Amoxicillin     Other reaction(s): rash/itching   Penicillins Rash    Did it involve swelling of the face/tongue/throat, SOB, or low BP? no Did it involve sudden or severe rash/hives, skin peeling, or any reaction on the inside of your mouth or nose? Yes Did you need to seek medical attention at a hospital or doctor's office? No When did it last happen? early 20's     If all above answers are "NO", may proceed with cephalosporin use.   Pork-Derived Products    Penicillin G    Tamoxifen  Hypertension   Tramadol  Other (See Comments)    Increases her blood pressure    Vitals    11/14/2023    9:38 AM 10/11/2023   12:30 PM 09/19/2023   11:51 AM  Oncology Vitals  Height 165 cm  165 cm  Weight 53.933 kg 54.885 kg 52.708 kg  Weight (lbs) 118 lbs 14 oz 121 lbs 116 lbs 3 oz  BMI 19.79 kg/m2 20.14 kg/m2 19.34 kg/m2  Temp 98.1 F (36.7 C) 98.4 F (36.9 C) 97.8 F (36.6 C)  Pulse Rate 98 106 94  BP 137/88 130/79 155/91  Resp 16 17 18   SpO2 100 % 99 % 99 %  BSA (m2) 1.57 m2 1.59 m2 1.55 m2     Laboratory Data    Latest Ref Rng & Units 11/14/2023    9:12 AM 09/19/2023   11:28 AM 08/19/2023    5:42 AM  CBC EXTENDED  WBC 4.0 -  10.5 K/uL 6.2  5.5  8.6   RBC 3.87 - 5.11 MIL/uL 3.94  4.08  4.21   Hemoglobin 12.0 - 15.0 g/dL 88.7  87.8  87.3   HCT 36.0 - 46.0 % 33.9  36.1  38.0   Platelets 150 - 400 K/uL 411  335  273   NEUT# 1.7 - 7.7 K/uL 4.3  3.7    Lymph# 0.7 - 4.0 K/uL 1.4  1.4         Latest Ref Rng & Units 11/14/2023    9:12 AM 09/19/2023   11:28 AM 08/19/2023    5:42 AM  CMP  Glucose 70 - 99 mg/dL 829  872  878   BUN 6 - 20 mg/dL 16  13  6    Creatinine 0.44 - 1.00 mg/dL 8.85  9.11  9.30   Sodium 135 - 145 mmol/L 142  143  135   Potassium 3.5 - 5.1 mmol/L 4.0  3.4  3.5   Chloride 98 - 111 mmol/L 109  103  102   CO2 22 - 32 mmol/L 26  31  26    Calcium  8.9 - 10.3 mg/dL 10.1  10.0  8.8   Total Protein 6.5 - 8.1 g/dL 7.3  7.5    Total Bilirubin 0.0 - 1.2 mg/dL 0.3  0.4    Alkaline Phos 38 - 126 U/L 81  83    AST 15 - 41 U/L 40  27    ALT 0 - 44 U/L 56  14     TSH / T4: not resulted at the time of this dictation  Contraindications Contraindications were reviewed? Yes Contraindications to therapy were identified? No   Safety Precautions The following safety precautions for the use of pembrolizumab  were reviewed:  Fever: reviewed the importance of having a thermometer and the Centers for Disease Control and Prevention (CDC) definition of fever which is 100.47F (38C) or higher. Patient should call 24/7 triage at [phone] if experiencing a fever or any other symptoms Fatigue Muscle or joint pain or weakness Rash or itchy skin Diarrhea Cough or shortness of breath Lung (respiratory tract) infection Changes in liver function Changes in kidney function Changes in electrolyte levels Changes in hormone levels (weight/mood changes, headaches, fatigue, sweating, hypertension, tachycardia) Inflammation to your colon (diarrhea or severe abdominal pain) Inflammation to your liver (jaundice, severe nausea and vomiting, easy bruising or bleeding) Inflammation to your lung (new or worsening cough,  shortness of breath, chest pain, difficulty breathing or wheezing) Severe skin reactions resulting in flu-like symptoms and painful rashes that spread or blister Vision changes (eye pain, swelling, redness, changes in vision such as flashes of light, blurred vision, floaters in our field of vision, light hurting your eyes) Infusion reactions Handling body fluids and waste Intimacy, sexual activity, contraception, and fertility  Medication Reconciliation Current Outpatient Medications  Medication Sig Dispense Refill   atenolol  (TENORMIN ) 50 MG tablet Take 1 tablet (50 mg total) by mouth 2 (two) times daily. 180 tablet 3   celecoxib  (CELEBREX ) 200 MG capsule Take 1 capsule (200 mg total) by mouth at bedtime. 30 capsule 6   Cholecalciferol  (VITAMIN D3) 125 MCG (5000 UT) CAPS Take 1 capsule (5,000 Units total) by mouth daily. 30 capsule 3   diazepam  (VALIUM ) 5 MG tablet Take 1 tablet (5 mg total) by mouth every 6 (six) hours as needed for muscle spasms. 30 tablet 3   megestrol  (MEGACE  ES) 625 MG/5ML suspension Take 5 mLs (625 mg total) by mouth daily. 150 mL 3   Multiple Vitamins-Minerals (MULTIVITAMIN WITH MINERALS) tablet FLORADIX Supplement-Iron  +minerals     oxyCODONE  ER (XTAMPZA  ER) 9 MG C12A Take 1 capsule by mouth every 12 (twelve) hours. 60 capsule 0   Oxycodone  HCl 10 MG TABS Take 1 tablet (10 mg total) by mouth every 3 (three) hours as needed ((score 7 to 10)). 180 tablet 0   polyethylene glycol (MIRALAX  / GLYCOLAX ) 17 g packet Take 17 g by mouth daily. 30 each 0   potassium chloride  SA (KLOR-CON  M) 20 MEQ tablet Take 1 tablet by mouth daily. 30 tablet 3   Silver -Carboxymethylcellulose (AQUACEL AG ADVANTAGE) 4X5 PADS Apply 1 Pad topically in the morning and at bedtime. 60 each 3   metroNIDAZOLE  (METROGEL ) 0.75 % gel Apply topically 2 (two) times daily. 135 g 6   ondansetron  (ZOFRAN ) 8 MG tablet Take 1 tablet (8 mg total) by mouth every 8 (eight) hours as needed for nausea or vomiting.  30 tablet 1   prochlorperazine  (COMPAZINE ) 10 MG tablet Take 1 tablet (10 mg total) by mouth every 6 (six) hours as needed for nausea or vomiting. 30 tablet 1  No current facility-administered medications for this visit.    Medication reconciliation is based on the patient's most recent medication list in the electronic medical record (EMR) including herbal products and OTC medications.   The patient's medication list was reviewed today with the patient? Yes   Drug-drug interactions (DDIs) DDIs were evaluated? Yes Significant DDIs identified? No   Drug-Food Interactions Drug-food interactions were evaluated? Yes Drug-food interactions identified? No   Follow-up Plan  Treatment start date: 11/14/23 [facility] placement date: peripheral We reviewed the prescriptions, premedications, and treatment regimen with the patient. Possible side effects of the treatment regimen were reviewed and management strategies were discussed. Can use loperamide as needed for diarrhea and Senna-S as needed for constipation.  She follows with palliative care. They are working on obtaining Clear Boost for her She inquired about a refill for Metrogel  for her wound. We will contact [facility] to see if it can be refilled She inquired if Dr. Odean would prescribe a systemic antibiotic. Message forwarded to him She will receive pembrolizumab  200 mg IV every 3 weeks for now, may consider 400 mg IV every 6 weeks since lives 1.5 hours away in future. Clinical pharmacy will assist Dr. Vinay Gudena and Kathren Gaskins on an as needed basis going forward  Kathren Gaskins participated in the discussion, expressed understanding, and voiced agreement with the above plan. All questions were answered to her satisfaction. The patient was advised to contact the clinic at [phone] with any questions or concerns prior to her return visit.   I spent 60 minutes assessing the patient.  Ripken Rekowski A. Lucila, PharmD, BCOP, CPP  Norleen DELENA Lucila, RPH-CPP, 11/14/2023 9:27 AM  **Disclaimer: This note was dictated with voice recognition software. Similar sounding words can inadvertently be transcribed and this note may contain transcription errors which may not have been corrected upon publication of note.**

## 2023-11-14 NOTE — Patient Instructions (Signed)

## 2023-11-15 ENCOUNTER — Telehealth: Payer: Self-pay

## 2023-11-15 ENCOUNTER — Telehealth: Payer: Self-pay | Admitting: Nutrition

## 2023-11-15 NOTE — Telephone Encounter (Signed)
-----   Message from Nurse Reece Levy sent at 11/14/2023  1:18 PM EST ----- Regarding: Dr.Gudena 1st time Keytruda f/u tol well Dr. Pamelia Hoit 1st time Keytruda f/u tolerated well. Pt call back due.

## 2023-11-15 NOTE — Telephone Encounter (Signed)
 Misty Arnold states that she is doing fine. She is eating, drinking, and urinating well. She knows to call the office at [phone] if  she has any questions or concerns.

## 2023-11-15 NOTE — Telephone Encounter (Signed)
Scheduled appointment per scheduling message. Patient is aware of the made appointment. 

## 2023-11-16 LAB — T4: T4, Total: 11.2 ug/dL (ref 4.5–12.0)

## 2023-11-18 DIAGNOSIS — T148XXA Other injury of unspecified body region, initial encounter: Secondary | ICD-10-CM | POA: Diagnosis not present

## 2023-11-18 DIAGNOSIS — T8189XA Other complications of procedures, not elsewhere classified, initial encounter: Secondary | ICD-10-CM | POA: Diagnosis not present

## 2023-11-18 DIAGNOSIS — C50919 Malignant neoplasm of unspecified site of unspecified female breast: Secondary | ICD-10-CM | POA: Diagnosis not present

## 2023-11-18 DIAGNOSIS — C50411 Malignant neoplasm of upper-outer quadrant of right female breast: Secondary | ICD-10-CM | POA: Diagnosis not present

## 2023-11-18 DIAGNOSIS — L089 Local infection of the skin and subcutaneous tissue, unspecified: Secondary | ICD-10-CM | POA: Diagnosis not present

## 2023-11-19 ENCOUNTER — Inpatient Hospital Stay: Payer: Medicare PPO | Admitting: Nutrition

## 2023-11-19 NOTE — Progress Notes (Signed)
 Patient did not show up for nutrition appointment.

## 2023-11-20 ENCOUNTER — Telehealth: Payer: Self-pay | Admitting: Nutrition

## 2023-11-20 NOTE — Telephone Encounter (Signed)
 Called to reschedule appointment per referral. Patient is aware of the made appointments.

## 2023-11-22 ENCOUNTER — Other Ambulatory Visit: Payer: Self-pay

## 2023-11-22 ENCOUNTER — Emergency Department (HOSPITAL_COMMUNITY)
Admission: EM | Admit: 2023-11-22 | Discharge: 2023-11-23 | Disposition: A | Discharge status: Home / Self Care | Payer: Medicare PPO | Attending: Emergency Medicine | Admitting: Emergency Medicine

## 2023-11-22 ENCOUNTER — Emergency Department (HOSPITAL_COMMUNITY): Payer: Medicare PPO

## 2023-11-22 ENCOUNTER — Telehealth: Payer: Self-pay | Admitting: *Deleted

## 2023-11-22 DIAGNOSIS — J929 Pleural plaque without asbestos: Secondary | ICD-10-CM | POA: Diagnosis not present

## 2023-11-22 DIAGNOSIS — Z853 Personal history of malignant neoplasm of breast: Secondary | ICD-10-CM | POA: Insufficient documentation

## 2023-11-22 DIAGNOSIS — R0789 Other chest pain: Secondary | ICD-10-CM | POA: Insufficient documentation

## 2023-11-22 DIAGNOSIS — R918 Other nonspecific abnormal finding of lung field: Secondary | ICD-10-CM | POA: Diagnosis not present

## 2023-11-22 DIAGNOSIS — R079 Chest pain, unspecified: Secondary | ICD-10-CM | POA: Diagnosis not present

## 2023-11-22 DIAGNOSIS — C761 Malignant neoplasm of thorax: Secondary | ICD-10-CM | POA: Diagnosis not present

## 2023-11-22 DIAGNOSIS — J90 Pleural effusion, not elsewhere classified: Secondary | ICD-10-CM | POA: Diagnosis not present

## 2023-11-22 DIAGNOSIS — C787 Secondary malignant neoplasm of liver and intrahepatic bile duct: Secondary | ICD-10-CM | POA: Diagnosis not present

## 2023-11-22 DIAGNOSIS — R590 Localized enlarged lymph nodes: Secondary | ICD-10-CM | POA: Diagnosis not present

## 2023-11-22 LAB — CBC WITH DIFFERENTIAL/PLATELET
Abs Immature Granulocytes: 0.05 10*3/uL (ref 0.00–0.07)
Basophils Absolute: 0 10*3/uL (ref 0.0–0.1)
Basophils Relative: 0 %
Eosinophils Absolute: 0 10*3/uL (ref 0.0–0.5)
Eosinophils Relative: 0 %
HCT: 37.3 % (ref 36.0–46.0)
Hemoglobin: 11.9 g/dL — ABNORMAL LOW (ref 12.0–15.0)
Immature Granulocytes: 0 %
Lymphocytes Relative: 12 %
Lymphs Abs: 1.4 10*3/uL (ref 0.7–4.0)
MCH: 28 pg (ref 26.0–34.0)
MCHC: 31.9 g/dL (ref 30.0–36.0)
MCV: 87.8 fL (ref 80.0–100.0)
Monocytes Absolute: 0.9 10*3/uL (ref 0.1–1.0)
Monocytes Relative: 7 %
Neutro Abs: 9.2 10*3/uL — ABNORMAL HIGH (ref 1.7–7.7)
Neutrophils Relative %: 81 %
Platelets: 397 10*3/uL (ref 150–400)
RBC: 4.25 MIL/uL (ref 3.87–5.11)
RDW: 15.7 % — ABNORMAL HIGH (ref 11.5–15.5)
WBC: 11.6 10*3/uL — ABNORMAL HIGH (ref 4.0–10.5)
nRBC: 0 % (ref 0.0–0.2)

## 2023-11-22 LAB — COMPREHENSIVE METABOLIC PANEL
ALT: 30 U/L (ref 0–44)
AST: 61 U/L — ABNORMAL HIGH (ref 15–41)
Albumin: 3.5 g/dL (ref 3.5–5.0)
Alkaline Phosphatase: 82 U/L (ref 38–126)
Anion gap: 12 (ref 5–15)
BUN: 10 mg/dL (ref 6–20)
CO2: 20 mmol/L — ABNORMAL LOW (ref 22–32)
Calcium: 10.3 mg/dL (ref 8.9–10.3)
Chloride: 101 mmol/L (ref 98–111)
Creatinine, Ser: 0.89 mg/dL (ref 0.44–1.00)
GFR, Estimated: >60 mL/min (ref >60)
Glucose, Bld: 107 mg/dL — ABNORMAL HIGH (ref 70–99)
Potassium: 3.7 mmol/L (ref 3.5–5.1)
Sodium: 133 mmol/L — ABNORMAL LOW (ref 135–145)
Total Bilirubin: 0.8 mg/dL (ref 0.0–1.2)
Total Protein: 7.9 g/dL (ref 6.5–8.1)

## 2023-11-22 LAB — I-STAT CG4 LACTIC ACID, ED: Lactic Acid, Venous: 1.3 mmol/L (ref 0.5–1.9)

## 2023-11-22 LAB — PROTIME-INR
INR: 1.2 (ref 0.8–1.2)
Prothrombin Time: 15.8 s — ABNORMAL HIGH (ref 11.4–15.2)

## 2023-11-22 LAB — TROPONIN I (HIGH SENSITIVITY): Troponin I (High Sensitivity): 4 ng/L (ref <18)

## 2023-11-22 MED ORDER — HYDROMORPHONE HCL 1 MG/ML IJ SOLN
0.5000 mg | Freq: Once | INTRAMUSCULAR | Status: AC
  Administered 2023-11-23: 0.5 mg via INTRAVENOUS
  Filled 2023-11-22: qty 1

## 2023-11-22 MED ORDER — IOHEXOL 350 MG/ML SOLN
75.0000 mL | Freq: Once | INTRAVENOUS | Status: AC | PRN
  Administered 2023-11-22: 75 mL via INTRAVENOUS

## 2023-11-22 MED ORDER — HYDROMORPHONE HCL 1 MG/ML IJ SOLN
0.5000 mg | Freq: Once | INTRAMUSCULAR | Status: AC
  Administered 2023-11-22: 0.5 mg via INTRAVENOUS
  Filled 2023-11-22: qty 1

## 2023-11-22 MED ORDER — SODIUM CHLORIDE 0.9 % IV BOLUS
1000.0000 mL | Freq: Once | INTRAVENOUS | Status: AC
  Administered 2023-11-22: 1000 mL via INTRAVENOUS

## 2023-11-22 NOTE — Discharge Instructions (Signed)
Make sure you take your as needed pain medicine every 3 hours if needed.  Call your doctor Monday and let them know how you are doing.

## 2023-11-22 NOTE — ED Provider Notes (Signed)
[place] EMERGENCY DEPARTMENT AT [facility] Provider Note   CSN: [id number] Arrival date & time: 11/22/23  2038     History {Add pertinent medical, surgical, social history, OB history to HPI:1} Chief Complaint  Patient presents with   Chest Pain    Misty Arnold is a 53 y.o. female.  Patient has metastatic breast cancer and is under palliative care.  Patient complains of worsening left-sided chest pain.  She takes long-acting pain medicine twice a day and is supposed to take Percocet every 3 hours for pain but only took it once a day   Chest Pain      Home Medications Prior to Admission medications   Medication Sig Start Date End Date Taking? Authorizing Provider  atenolol (TENORMIN) 50 MG tablet Take 1 tablet (50 mg total) by mouth 2 (two) times daily. 04/24/22   Serena Croissant, MD  celecoxib (CELEBREX) 200 MG capsule Take 1 capsule (200 mg total) by mouth at bedtime. 10/11/23   Serena Croissant, MD  Cholecalciferol (VITAMIN D3) 125 MCG (5000 UT) CAPS Take 1 capsule (5,000 Units total) by mouth daily. 10/16/21   Edsel Petrin, DO  diazepam (VALIUM) 5 MG tablet Take 1 tablet (5 mg total) by mouth every 6 (six) hours as needed for muscle spasms. 10/11/23   Serena Croissant, MD  megestrol (MEGACE ES) 625 MG/5ML suspension Take 5 mLs (625 mg total) by mouth daily. 10/11/23   Serena Croissant, MD  metroNIDAZOLE (METROGEL) 0.75 % gel Apply topically 2 (two) times daily. 11/14/23   Serena Croissant, MD  Multiple Vitamins-Minerals (MULTIVITAMIN WITH MINERALS) tablet FLORADIX Supplement-Iron +minerals 10/16/21   Anderson Malta L, DO  ondansetron (ZOFRAN) 8 MG tablet Take 1 tablet (8 mg total) by mouth every 8 (eight) hours as needed for nausea or vomiting. 11/14/23   Rachel Moulds, MD  oxyCODONE ER (XTAMPZA ER) 9 MG C12A Take 1 capsule by mouth every 12 (twelve) hours. 11/11/23   Pickenpack-Cousar, Arty Baumgartner, NP  Oxycodone HCl 10 MG TABS Take 1 tablet (10 mg total) by mouth every 3  (three) hours as needed ((score 7 to 10)). 11/11/23   Pickenpack-Cousar, Arty Baumgartner, NP  polyethylene glycol (MIRALAX / GLYCOLAX) 17 g packet Take 17 g by mouth daily. 10/16/21   Edsel Petrin, DO  potassium chloride SA (KLOR-CON M) 20 MEQ tablet Take 1 tablet by mouth daily. 06/26/22   Serena Croissant, MD  prochlorperazine (COMPAZINE) 10 MG tablet Take 1 tablet (10 mg total) by mouth every 6 (six) hours as needed for nausea or vomiting. 11/14/23   Rachel Moulds, MD  Silver-Carboxymethylcellulose (AQUACEL AG ADVANTAGE) 4"X5" PADS Apply 1 Pad topically in the morning and at bedtime. 07/03/23   Serena Croissant, MD      Allergies    Amoxicillin, Penicillins, Pork-derived products, Penicillin g, Tamoxifen, and Tramadol    Review of Systems   Review of Systems  Cardiovascular:  Positive for chest pain.    Physical Exam Updated Vital Signs BP (!) 144/105 (BP Location: Left Arm)   Pulse (!) 133   Temp 98.9 F (37.2 C) (Oral)   Resp (!) 27   Ht 5\' 5"  (1.651 m)   Wt 53.1 kg   SpO2 97%   BMI 19.47 kg/m  Physical Exam  ED Results / Procedures / Treatments   Labs (all labs ordered are listed, but only abnormal results are displayed) Labs Reviewed  COMPREHENSIVE METABOLIC PANEL - Abnormal; Notable for the following components:      Result  Value   Sodium 133 (*)    CO2 20 (*)    Glucose, Bld 107 (*)    AST 61 (*)    All other components within normal limits  CBC WITH DIFFERENTIAL/PLATELET - Abnormal; Notable for the following components:   WBC 11.6 (*)    Hemoglobin 11.9 (*)    RDW 15.7 (*)    Neutro Abs 9.2 (*)    All other components within normal limits  PROTIME-INR - Abnormal; Notable for the following components:   Prothrombin Time 15.8 (*)    All other components within normal limits  CULTURE, BLOOD (ROUTINE X 2)  CULTURE, BLOOD (ROUTINE X 2)  I-STAT CG4 LACTIC ACID, ED  I-STAT CG4 LACTIC ACID, ED  TROPONIN I (HIGH SENSITIVITY)  TROPONIN I (HIGH SENSITIVITY)     EKG None  Radiology CT Angio Chest PE W and/or Wo Contrast Result Date: 11/22/2023 CLINICAL DATA:  Chest pain for 1 day, history of right breast cancer EXAM: CT ANGIOGRAPHY CHEST WITH CONTRAST TECHNIQUE: Multidetector CT imaging of the chest was performed using the standard protocol during bolus administration of intravenous contrast. Multiplanar CT image reconstructions and MIPs were obtained to evaluate the vascular anatomy. RADIATION DOSE REDUCTION: This exam was performed according to the departmental dose-optimization program which includes automated exposure control, adjustment of the mA and/or kV according to patient size and/or use of iterative reconstruction technique. CONTRAST:  75mL OMNIPAQUE IOHEXOL 350 MG/ML SOLN COMPARISON:  11/22/2023, 09/02/2023 FINDINGS: Cardiovascular: This is a technically adequate evaluation of the pulmonary vasculature. No filling defects or pulmonary emboli. The heart is unremarkable without pericardial effusion. No evidence of thoracic aortic aneurysm or dissection. 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 Mediastinum/Nodes: Thyroid, trachea, and esophagus are grossly unremarkable. Marked progression of the bilateral axillary adenopathy seen previously. Left axillary adenopathy measures 4.8 x 3.5 cm reference image 39/5, previously measuring 3.2 x 2.7 cm. Pre-vascular adenopathy within the anterior mediastinum now measures 1.5 by 2.1 cm reference image 62/5, previously 1.1 x 0.9 cm. Lungs/Pleura: Multilocular right pleural effusion is identified, increased since prior study. No airspace disease or pneumothorax. Chronic areas of atelectasis and scarring are seen within the right middle and right lower lobes. Central airways are patent. Upper Abdomen: Enlarging hypodense masses throughout the liver consistent with progressive metastatic disease. Lesions measure up to 6.7 cm in size. Musculoskeletal: Multiple chest wall bony and soft tissue masses  have progressed in the interim since prior exam consistent with progression of disease. Largest lesion within the lower right anterior chest measures up to 8.3 x 9.3 cm, previously 8.9 x 7.0 cm. Numerous destructive lesions are seen throughout the thoracic cage, most pronounced within the right fourth, fifth, sixth, eighth, and ninth ribs. Progressive destructive lesion involving the manubrium and proximal sternum. Stable postsurgical changes of the thoracic spine. Reconstructed images demonstrate no additional findings. Review of the MIP images confirms the above findings. IMPRESSION: 1. No evidence of pulmonary embolus. 2. Marked progression of known metastatic breast cancer, with enlarging chest wall bony and soft tissue masses, progressive hepatic metastases, worsening bony metastases, and progressive mediastinal and axillary adenopathy. 3. Multilocular right pleural effusion, increased since prior study. Electronically Signed   By: Sharlet Salina M.D.   On: 11/22/2023 23:04   DG Chest Port 1 View Result Date: 11/22/2023 CLINICAL DATA:  Chest pain with nausea and vomiting. EXAM: PORTABLE CHEST 1 VIEW COMPARISON:  01/14/2020, 08/13/2023. FINDINGS: The heart size and mediastinal contours are within normal limits. There is  hazy opacification of the mid to lower right lung with a soft tissue pleural density in the mid to right lower lung field. Lytic regions are noted in the ribs on the right with suspected pathologic fracture of the T7 rib on the right. No pneumothorax is seen. The left lung is clear. Thoracic spinal fusion hardware is present. Surgical clips are noted in the right axilla. IMPRESSION: 1. Hazy opacification of the mid to right lower lung with pleural thickening, compatible with known metastatic disease. 2. Small right pleural effusion. 3. Multiple lytic and expansile lesions in the ribs on the left with suspected pathologic fracture of the T7 rib on the right. Electronically Signed   By: Thornell Sartorius M.D.   On: 11/22/2023 21:24    Procedures Procedures  {Document cardiac monitor, telemetry assessment procedure when appropriate:1}  Medications Ordered in ED Medications  HYDROmorphone (DILAUDID) injection 0.5 mg (has no administration in time range)  HYDROmorphone (DILAUDID) injection 0.5 mg (0.5 mg Intravenous Given 11/22/23 2129)  sodium chloride 0.9 % bolus 1,000 mL (1,000 mLs Intravenous New Bag/Given 11/22/23 2129)  HYDROmorphone (DILAUDID) injection 0.5 mg (0.5 mg Intravenous Given 11/22/23 2155)  iohexol (OMNIPAQUE) 350 MG/ML injection 75 mL (75 mLs Intravenous Contrast Given 11/22/23 2223)    ED Course/ Medical Decision Making/ A&P   {Patient having worsening chest pain from metastatic disease.  Patient CT does not show any PE but worsening metastatic disease.  Patient improved with Dilaudid and prefers to go home. Click here for ABCD2, HEART and other calculatorsREFRESH Note before signing :1}                              Medical Decision Making Amount and/or Complexity of Data Reviewed Labs: ordered. Radiology: ordered. ECG/medicine tests: ordered.  Risk Prescription drug management.   Patient with worsening chest pain secondary to metastatic breast cancer.  She is under palliative care.  Patient wants to be discharged home and will follow-up with her oncologist.  She is going to increase her pain medicine  {Document critical care time when appropriate:1} {Document review of labs and clinical decision tools ie heart score, Chads2Vasc2 etc:1}  {Document your independent review of radiology images, and any outside records:1} {Document your discussion with family members, caretakers, and with consultants:1} {Document social determinants of health affecting pt's care:1} {Document your decision making why or why not admission, treatments were needed:1} Final Clinical Impression(s) / ED Diagnoses Final diagnoses:  Chest wall pain    Rx / DC Orders ED Discharge  Orders     None

## 2023-11-22 NOTE — Telephone Encounter (Signed)
Received VM from pt regarding wound supplies.  RN attempt x1 to return call.  No answer, LVM for pt to return call to the office.

## 2023-11-22 NOTE — ED Triage Notes (Signed)
Patient c/o chest pain x1 day. Patient report worsening pain tonight. Patient report taking PRN medication without relief. Patient report N/V today. Patient report last immuno therapy treatment last Thursday. Hx Breast Ca

## 2023-11-23 DIAGNOSIS — R0789 Other chest pain: Secondary | ICD-10-CM | POA: Diagnosis not present

## 2023-11-25 ENCOUNTER — Telehealth: Payer: Self-pay | Admitting: *Deleted

## 2023-11-25 NOTE — Telephone Encounter (Signed)
This RN called pt per her VM stating in a very weakened voice- requesting a return call "been kind of with holding information - and feel like killing myself, please call me - I need some help"  Per discussion- Misty Arnold denied having a plan for ending her life. She denies having a gun in the home.  She states she feels pain is worse but also having a lot of "mentally not doing well ".  She states she has family and friends but does not want them to know what is going on "and really want to get away from them".  "No one knows what I am going through"  This RN validated her statements and feelings- as well as goal to help her best as can.  Per discussion she states she does not feel safe in her home - mainly because of her thoughts.  She feels her pain is not well controlled and verified she is taking the " ER pain med at 10 am and pm then then oxycodone in between"  Per discussion with palliative care provider who is assisting with pt - and recent ER visit for "chest pain" - reviewed with pt possible plan to come to the [facility] ER- discuss with them exactly what she has told this RN of not feeling safe in her home as well as not able to care for herself properly-including managing her pain with thoughts of wanting to end her life.  Misty Arnold verbally stated understanding and agreement to above.  This RN called report to [employer] - charge- in ER of pending arrival.

## 2023-11-26 ENCOUNTER — Telehealth (HOSPITAL_BASED_OUTPATIENT_CLINIC_OR_DEPARTMENT_OTHER): Payer: Medicare PPO | Admitting: Nurse Practitioner

## 2023-11-26 ENCOUNTER — Encounter: Payer: Self-pay | Admitting: Nurse Practitioner

## 2023-11-26 ENCOUNTER — Telehealth: Payer: Self-pay | Admitting: *Deleted

## 2023-11-26 DIAGNOSIS — G893 Neoplasm related pain (acute) (chronic): Secondary | ICD-10-CM

## 2023-11-26 DIAGNOSIS — Z515 Encounter for palliative care: Secondary | ICD-10-CM

## 2023-11-26 DIAGNOSIS — C50919 Malignant neoplasm of unspecified site of unspecified female breast: Secondary | ICD-10-CM

## 2023-11-26 NOTE — Telephone Encounter (Signed)
I connected with Misty Arnold on 1750 at  by telephone and verified that I am speaking with the correct person using two identifiers.   I discussed the limitations, risks, security and privacy concerns of performing an evaluation and management service by telemedicine and the availability of in-person appointments. I also discussed with the patient that there may be a patient responsible charge related to this service. The patient expressed understanding and agreed to proceed.   Other persons participating in the visit and their role in the encounter: N/A   Patient's location: Home  Provider's location: [facility]   Chief Complaint: Pain  I connected by phone with Misty Arnold for follow-up on her pain and emotional state. She reports feeling somewhat better this afternoon compared to the past 24 hours. Misty Arnold is complaining of increased right side chest wall and back pain despite her regimen. Does acknowledge she was not taking breakthrough medications as often as she could however has increased use due to pain severity.   She is taking Xtampza 9mg  every 12 hours. Given constant sharp pain advised patient to increase Xtampza to every 8 hours with home supply. She will continue with breakthrough medication every 4 hours as needed.   Misty Arnold reports she is in a safe environment. No signs of threats of self harm at this time. Is resting at home with support. She knows to seek emergency help if she has thoughts of harming herself or pain becomes unmanageable.   Patient expressed understanding and appreciation of support. She knows to contact office if any changes or pain remains uncontrolled.   All questions answered and support provided.   Any controlled substances utilized were prescribed in the context of palliative care. PDMP has been reviewed.    Visit consisted of counseling and education dealing with the complex and emotionally intense issues of symptom management and palliative care in the  setting of serious and potentially life-threatening illness.  Willette Alma, AGPCNP-BC  Palliative Medicine Team/[facility]

## 2023-11-26 NOTE — Telephone Encounter (Signed)
Pt called and stated that she is better emotionally today than yesterday. She is managing her pain better today with prescribed pain medication. She's been having transportation issues and can not make it from [place] to [place] and be seen in our [facility] ED. She stated that it is now snowing in [place] and will let us know daily how she is doing. Advised if she continues to have SI to please contact emergency medical for assistance as we are available to aid her as much as we can. Pt was appreciative and verbalized understanding.

## 2023-11-27 LAB — CULTURE, BLOOD (ROUTINE X 2)
Culture: NO GROWTH
Culture: NO GROWTH

## 2023-11-29 ENCOUNTER — Other Ambulatory Visit: Payer: Self-pay | Admitting: *Deleted

## 2023-11-29 DIAGNOSIS — C7949 Secondary malignant neoplasm of other parts of nervous system: Secondary | ICD-10-CM

## 2023-11-29 DIAGNOSIS — Z170 Estrogen receptor positive status [ER+]: Secondary | ICD-10-CM

## 2023-11-29 DIAGNOSIS — C50919 Malignant neoplasm of unspecified site of unspecified female breast: Secondary | ICD-10-CM

## 2023-11-29 DIAGNOSIS — T148XXA Other injury of unspecified body region, initial encounter: Secondary | ICD-10-CM

## 2023-11-29 DIAGNOSIS — G893 Neoplasm related pain (acute) (chronic): Secondary | ICD-10-CM

## 2023-11-29 NOTE — Progress Notes (Signed)
Received call from pt requesting to be set up with home health RN for chest wound care as well as physical therapy. RN successfully faxed referral to [facility] at [phone].

## 2023-12-01 DIAGNOSIS — F419 Anxiety disorder, unspecified: Secondary | ICD-10-CM | POA: Diagnosis not present

## 2023-12-01 DIAGNOSIS — F32A Depression, unspecified: Secondary | ICD-10-CM | POA: Diagnosis not present

## 2023-12-01 DIAGNOSIS — R530 Neoplastic (malignant) related fatigue: Secondary | ICD-10-CM | POA: Diagnosis not present

## 2023-12-01 DIAGNOSIS — R45851 Suicidal ideations: Secondary | ICD-10-CM | POA: Diagnosis not present

## 2023-12-01 DIAGNOSIS — Z170 Estrogen receptor positive status [ER+]: Secondary | ICD-10-CM | POA: Diagnosis not present

## 2023-12-01 DIAGNOSIS — S21101D Unspecified open wound of right front wall of thorax without penetration into thoracic cavity, subsequent encounter: Secondary | ICD-10-CM | POA: Diagnosis not present

## 2023-12-01 DIAGNOSIS — C7951 Secondary malignant neoplasm of bone: Secondary | ICD-10-CM | POA: Diagnosis not present

## 2023-12-01 DIAGNOSIS — G893 Neoplasm related pain (acute) (chronic): Secondary | ICD-10-CM | POA: Diagnosis not present

## 2023-12-01 DIAGNOSIS — C50411 Malignant neoplasm of upper-outer quadrant of right female breast: Secondary | ICD-10-CM | POA: Diagnosis not present

## 2023-12-02 ENCOUNTER — Encounter: Payer: Self-pay | Admitting: Nurse Practitioner

## 2023-12-02 ENCOUNTER — Other Ambulatory Visit: Payer: Self-pay | Admitting: Hematology and Oncology

## 2023-12-02 ENCOUNTER — Other Ambulatory Visit (HOSPITAL_COMMUNITY): Payer: Self-pay

## 2023-12-02 DIAGNOSIS — C7949 Secondary malignant neoplasm of other parts of nervous system: Secondary | ICD-10-CM

## 2023-12-02 DIAGNOSIS — C50919 Malignant neoplasm of unspecified site of unspecified female breast: Secondary | ICD-10-CM

## 2023-12-02 DIAGNOSIS — G893 Neoplasm related pain (acute) (chronic): Secondary | ICD-10-CM

## 2023-12-02 DIAGNOSIS — Z515 Encounter for palliative care: Secondary | ICD-10-CM

## 2023-12-02 DIAGNOSIS — R530 Neoplastic (malignant) related fatigue: Secondary | ICD-10-CM

## 2023-12-02 DIAGNOSIS — Z170 Estrogen receptor positive status [ER+]: Secondary | ICD-10-CM

## 2023-12-02 MED ORDER — XTAMPZA ER 9 MG PO C12A
9.0000 mg | EXTENDED_RELEASE_CAPSULE | Freq: Three times a day (TID) | ORAL | 0 refills | Status: DC
  Filled 2023-12-02: qty 60, 20d supply, fill #0

## 2023-12-02 NOTE — Progress Notes (Signed)
I connected with Misty Arnold on 12/02/23 at  1420 by phone and verified that I am speaking with the correct person using two identifiers.   I discussed the limitations, risks, security and privacy concerns of performing an evaluation and management service by telemedicine and the availability of in-person appointments. I also discussed with the patient that there may be a patient responsible charge related to this service. The patient expressed understanding and agreed to proceed.   Other persons participating in the visit and their role in the encounter: N/A   Patient's location: Home  Provider's location: [facility]   Chief Complaint: Pain   I received a phone call from [name] expressing ongoing pain, fatigue, and "not feeling so well". Patient shares she continues to have some anxiety and emotional concerns regarding health, living space, and family. Emotional support provided. Misty Arnold states she was unable to get family to bring her to the hospital last week.   She increased Xtampza 9mg  to three times daily as advised last week. She has noticed some improvement in her pain. Reports pain is worst later in the evening hours. Pain is specific to her lower back and chest wall area at the wound site.   Patient is inquiring about coming to the hospital to be admitted for pain and remaining hospitalized to receive her treatment that is scheduled for 2/3. I advised patient her treatment would not typically be given during hospitalization however given symptom burden would recommend further evaluation. She expresses she has a family member that could possibly bring her on Thursday to ER but that would be the soonest. Advised patient would seek care immediately if symptoms remain uncontrolled or the need for mental health evaluation. She does not wish to delay her treatment and plans to consider her options.   I reviewed at length current medication regimen for her pain. She has valium on hand for  spasms. Xtampza 9mg  evey 8 hours and Oxycodone 10mg  every 5 or 6 hours. I advised she could take her breakthrough medication sooner. Patient verbalized understanding expressing she had not been taking during the day as much after adjusting Xtamzpa to every 8 hours. Was unaware she could still take. Education provided. Also advised if pain is severe may take 1.5 tablets every 4 hours. She express understanding and appreciation with hopes this will offer additional relief.   I again advised patient of when to seek medical assistance. For now she would like to keep scheduled visits and commits to making the best decision as needed over the next week.   All questions answered and support provided.   Any controlled substances utilized were prescribed in the context of palliative care. PDMP has been reviewed.    Visit consisted of counseling and education dealing with the complex and emotionally intense issues of symptom management and palliative care in the setting of serious and potentially life-threatening illness.  Willette Alma, AGPCNP-BC  Palliative Medicine Team/[facility]

## 2023-12-03 ENCOUNTER — Telehealth: Payer: Self-pay

## 2023-12-03 NOTE — Telephone Encounter (Addendum)
Received a voicemail from patient reporting that she would be "in tomorrow." This RN returned the call to patient to clarify, as she does not have any appointments scheduled here at the [facility]. Patient reported that as discussed last week she would be coming to the ED to be evaluated for her worsening pain. Patient also reported having thoughts of hurting herself or wishing for death. She reports that she has no plan currently, reported feeling safe in her home, and has no access to firearms. Patient was strongly encouraged to go to the closest ED or call 911, patient declined saying the hospital nearest her in [place] was not a hospital she felt safe in. When asked to come to the [facility] ED pt reported she did not have a ride today but would have one early tomorrow morning. Again, this RN strongly encouraged the pt to come in today or call an ambulance, pt declined stating she would be "ok until tomorrow". Patient reporting her pain is currently better managed and she is resting now. Patient educated on strict ED precautions and callback number provided should she wish to talk to one of [name]. Patient verbalized understanding and agreement to go to ED in the morning or sooner if something changes. Palliative and Medical oncology team made aware.

## 2023-12-04 ENCOUNTER — Inpatient Hospital Stay (HOSPITAL_COMMUNITY)
Admission: EM | Admit: 2023-12-04 | Discharge: 2023-12-10 | DRG: 948 | Disposition: A | Discharge status: Home / Self Care | Payer: Medicare PPO | Attending: Internal Medicine | Admitting: Internal Medicine

## 2023-12-04 ENCOUNTER — Encounter: Payer: Self-pay | Admitting: Hematology and Oncology

## 2023-12-04 ENCOUNTER — Emergency Department (HOSPITAL_COMMUNITY): Payer: Medicare PPO

## 2023-12-04 ENCOUNTER — Other Ambulatory Visit: Payer: Self-pay

## 2023-12-04 ENCOUNTER — Telehealth: Payer: Self-pay | Admitting: *Deleted

## 2023-12-04 DIAGNOSIS — Z853 Personal history of malignant neoplasm of breast: Secondary | ICD-10-CM

## 2023-12-04 DIAGNOSIS — Z803 Family history of malignant neoplasm of breast: Secondary | ICD-10-CM

## 2023-12-04 DIAGNOSIS — Z9221 Personal history of antineoplastic chemotherapy: Secondary | ICD-10-CM

## 2023-12-04 DIAGNOSIS — Z515 Encounter for palliative care: Secondary | ICD-10-CM | POA: Diagnosis not present

## 2023-12-04 DIAGNOSIS — Z880 Allergy status to penicillin: Secondary | ICD-10-CM

## 2023-12-04 DIAGNOSIS — C7951 Secondary malignant neoplasm of bone: Secondary | ICD-10-CM | POA: Diagnosis present

## 2023-12-04 DIAGNOSIS — I10 Essential (primary) hypertension: Secondary | ICD-10-CM | POA: Diagnosis present

## 2023-12-04 DIAGNOSIS — R000 Tachycardia, unspecified: Secondary | ICD-10-CM | POA: Diagnosis not present

## 2023-12-04 DIAGNOSIS — M545 Low back pain, unspecified: Principal | ICD-10-CM | POA: Diagnosis present

## 2023-12-04 DIAGNOSIS — Z833 Family history of diabetes mellitus: Secondary | ICD-10-CM

## 2023-12-04 DIAGNOSIS — R45851 Suicidal ideations: Secondary | ICD-10-CM | POA: Diagnosis present

## 2023-12-04 DIAGNOSIS — Z885 Allergy status to narcotic agent status: Secondary | ICD-10-CM

## 2023-12-04 DIAGNOSIS — Z83438 Family history of other disorder of lipoprotein metabolism and other lipidemia: Secondary | ICD-10-CM

## 2023-12-04 DIAGNOSIS — D72829 Elevated white blood cell count, unspecified: Secondary | ICD-10-CM | POA: Diagnosis present

## 2023-12-04 DIAGNOSIS — E876 Hypokalemia: Secondary | ICD-10-CM | POA: Diagnosis not present

## 2023-12-04 DIAGNOSIS — R0789 Other chest pain: Secondary | ICD-10-CM | POA: Diagnosis not present

## 2023-12-04 DIAGNOSIS — Z791 Long term (current) use of non-steroidal anti-inflammatories (NSAID): Secondary | ICD-10-CM | POA: Diagnosis not present

## 2023-12-04 DIAGNOSIS — G893 Neoplasm related pain (acute) (chronic): Principal | ICD-10-CM | POA: Diagnosis present

## 2023-12-04 DIAGNOSIS — Z823 Family history of stroke: Secondary | ICD-10-CM | POA: Diagnosis not present

## 2023-12-04 DIAGNOSIS — Z9011 Acquired absence of right breast and nipple: Secondary | ICD-10-CM

## 2023-12-04 DIAGNOSIS — C792 Secondary malignant neoplasm of skin: Secondary | ICD-10-CM | POA: Diagnosis present

## 2023-12-04 DIAGNOSIS — Z79891 Long term (current) use of opiate analgesic: Secondary | ICD-10-CM | POA: Diagnosis not present

## 2023-12-04 DIAGNOSIS — C50919 Malignant neoplasm of unspecified site of unspecified female breast: Secondary | ICD-10-CM | POA: Diagnosis present

## 2023-12-04 DIAGNOSIS — Z79899 Other long term (current) drug therapy: Secondary | ICD-10-CM

## 2023-12-04 DIAGNOSIS — F32A Depression, unspecified: Secondary | ICD-10-CM | POA: Diagnosis not present

## 2023-12-04 DIAGNOSIS — Z800 Family history of malignant neoplasm of digestive organs: Secondary | ICD-10-CM

## 2023-12-04 DIAGNOSIS — Z888 Allergy status to other drugs, medicaments and biological substances status: Secondary | ICD-10-CM

## 2023-12-04 DIAGNOSIS — F331 Major depressive disorder, recurrent, moderate: Secondary | ICD-10-CM | POA: Diagnosis present

## 2023-12-04 DIAGNOSIS — Z8249 Family history of ischemic heart disease and other diseases of the circulatory system: Secondary | ICD-10-CM | POA: Diagnosis not present

## 2023-12-04 DIAGNOSIS — M5137 Other intervertebral disc degeneration, lumbosacral region with discogenic back pain only: Secondary | ICD-10-CM | POA: Diagnosis not present

## 2023-12-04 DIAGNOSIS — T148XXA Other injury of unspecified body region, initial encounter: Secondary | ICD-10-CM | POA: Diagnosis present

## 2023-12-04 DIAGNOSIS — F419 Anxiety disorder, unspecified: Secondary | ICD-10-CM | POA: Diagnosis present

## 2023-12-04 DIAGNOSIS — F4321 Adjustment disorder with depressed mood: Secondary | ICD-10-CM | POA: Diagnosis present

## 2023-12-04 DIAGNOSIS — M5136 Other intervertebral disc degeneration, lumbar region with discogenic back pain only: Secondary | ICD-10-CM | POA: Diagnosis not present

## 2023-12-04 DIAGNOSIS — G834 Cauda equina syndrome: Secondary | ICD-10-CM | POA: Diagnosis not present

## 2023-12-04 DIAGNOSIS — G4700 Insomnia, unspecified: Secondary | ICD-10-CM | POA: Diagnosis present

## 2023-12-04 LAB — RAPID URINE DRUG SCREEN, HOSP PERFORMED
Amphetamines: NOT DETECTED
Barbiturates: NOT DETECTED
Benzodiazepines: NOT DETECTED
Cocaine: NOT DETECTED
Opiates: POSITIVE — AB
Tetrahydrocannabinol: POSITIVE — AB

## 2023-12-04 LAB — URINALYSIS, ROUTINE W REFLEX MICROSCOPIC
Bacteria, UA: NONE SEEN
Bilirubin Urine: NEGATIVE
Glucose, UA: NEGATIVE mg/dL
Hgb urine dipstick: NEGATIVE
Ketones, ur: NEGATIVE mg/dL
Leukocytes,Ua: NEGATIVE
Nitrite: NEGATIVE
Protein, ur: 30 mg/dL — AB
Specific Gravity, Urine: 1.017 (ref 1.005–1.030)
pH: 7 (ref 5.0–8.0)

## 2023-12-04 LAB — CBC WITH DIFFERENTIAL/PLATELET
Abs Immature Granulocytes: 0.04 10*3/uL (ref 0.00–0.07)
Basophils Absolute: 0 10*3/uL (ref 0.0–0.1)
Basophils Relative: 1 %
Eosinophils Absolute: 0 10*3/uL (ref 0.0–0.5)
Eosinophils Relative: 0 %
HCT: 37.3 % (ref 36.0–46.0)
Hemoglobin: 11.7 g/dL — ABNORMAL LOW (ref 12.0–15.0)
Immature Granulocytes: 1 %
Lymphocytes Relative: 18 %
Lymphs Abs: 1.4 10*3/uL (ref 0.7–4.0)
MCH: 28.1 pg (ref 26.0–34.0)
MCHC: 31.4 g/dL (ref 30.0–36.0)
MCV: 89.4 fL (ref 80.0–100.0)
Monocytes Absolute: 0.4 10*3/uL (ref 0.1–1.0)
Monocytes Relative: 5 %
Neutro Abs: 5.9 10*3/uL (ref 1.7–7.7)
Neutrophils Relative %: 75 %
Platelets: 449 10*3/uL — ABNORMAL HIGH (ref 150–400)
RBC: 4.17 MIL/uL (ref 3.87–5.11)
RDW: 16.2 % — ABNORMAL HIGH (ref 11.5–15.5)
WBC: 7.8 10*3/uL (ref 4.0–10.5)
nRBC: 0 % (ref 0.0–0.2)

## 2023-12-04 LAB — COMPREHENSIVE METABOLIC PANEL
ALT: 70 U/L — ABNORMAL HIGH (ref 0–44)
AST: 91 U/L — ABNORMAL HIGH (ref 15–41)
Albumin: 3.2 g/dL — ABNORMAL LOW (ref 3.5–5.0)
Alkaline Phosphatase: 91 U/L (ref 38–126)
Anion gap: 10 (ref 5–15)
BUN: 11 mg/dL (ref 6–20)
CO2: 24 mmol/L (ref 22–32)
Calcium: 10.2 mg/dL (ref 8.9–10.3)
Chloride: 104 mmol/L (ref 98–111)
Creatinine, Ser: 1.08 mg/dL — ABNORMAL HIGH (ref 0.44–1.00)
GFR, Estimated: >60 mL/min (ref >60)
Glucose, Bld: 141 mg/dL — ABNORMAL HIGH (ref 70–99)
Potassium: 3.8 mmol/L (ref 3.5–5.1)
Sodium: 138 mmol/L (ref 135–145)
Total Bilirubin: 0.4 mg/dL (ref 0.0–1.2)
Total Protein: 7.6 g/dL (ref 6.5–8.1)

## 2023-12-04 LAB — ETHANOL: Alcohol, Ethyl (B): <10 mg/dL (ref <10)

## 2023-12-04 LAB — ACETAMINOPHEN LEVEL: Acetaminophen (Tylenol), Serum: <10 ug/mL — ABNORMAL LOW (ref 10–30)

## 2023-12-04 LAB — SALICYLATE LEVEL: Salicylate Lvl: <7 mg/dL — ABNORMAL LOW (ref 7.0–30.0)

## 2023-12-04 MED ORDER — METRONIDAZOLE 0.75 % EX GEL
Freq: Two times a day (BID) | CUTANEOUS | Status: DC
  Administered 2023-12-04: 1 via TOPICAL
  Filled 2023-12-04: qty 45

## 2023-12-04 MED ORDER — ONDANSETRON HCL 4 MG/2ML IJ SOLN
4.0000 mg | Freq: Four times a day (QID) | INTRAMUSCULAR | Status: DC | PRN
  Administered 2023-12-05: 4 mg via INTRAVENOUS
  Filled 2023-12-04: qty 2

## 2023-12-04 MED ORDER — ONDANSETRON HCL 4 MG PO TABS: 4.0000 mg | ORAL_TABLET | Freq: Four times a day (QID) | ORAL | Status: DC | PRN

## 2023-12-04 MED ORDER — HYDROMORPHONE HCL 1 MG/ML IJ SOLN
1.0000 mg | INTRAMUSCULAR | Status: DC | PRN
  Administered 2023-12-05: 1 mg via INTRAVENOUS
  Filled 2023-12-04 (×2): qty 1

## 2023-12-04 MED ORDER — GADOBUTROL 1 MMOL/ML IV SOLN
5.0000 mL | Freq: Once | INTRAVENOUS | Status: AC | PRN
  Administered 2023-12-04: 5 mL via INTRAVENOUS

## 2023-12-04 MED ORDER — POLYETHYLENE GLYCOL 3350 17 G PO PACK
17.0000 g | PACK | Freq: Every day | ORAL | Status: DC
  Administered 2023-12-05 – 2023-12-10 (×5): 17 g via ORAL
  Filled 2023-12-04 (×5): qty 1

## 2023-12-04 MED ORDER — ATENOLOL 50 MG PO TABS
50.0000 mg | ORAL_TABLET | Freq: Two times a day (BID) | ORAL | Status: DC
  Administered 2023-12-04 – 2023-12-10 (×12): 50 mg via ORAL
  Filled 2023-12-04 (×12): qty 1

## 2023-12-04 MED ORDER — HYDROMORPHONE HCL 1 MG/ML IJ SOLN
1.0000 mg | Freq: Once | INTRAMUSCULAR | Status: AC
  Administered 2023-12-04: 1 mg via INTRAVENOUS
  Filled 2023-12-04: qty 1

## 2023-12-04 MED ORDER — OXYCODONE HCL ER 10 MG PO T12A
10.0000 mg | EXTENDED_RELEASE_TABLET | Freq: Three times a day (TID) | ORAL | Status: DC
  Administered 2023-12-04 – 2023-12-07 (×10): 10 mg via ORAL
  Filled 2023-12-04 (×10): qty 1

## 2023-12-04 MED ORDER — OXYCODONE HCL 5 MG PO TABS
10.0000 mg | ORAL_TABLET | ORAL | Status: DC | PRN
  Administered 2023-12-05 – 2023-12-10 (×15): 10 mg via ORAL
  Filled 2023-12-04 (×16): qty 2

## 2023-12-04 MED ORDER — ACETAMINOPHEN 650 MG RE SUPP: 650.0000 mg | Freq: Four times a day (QID) | RECTAL | Status: DC | PRN

## 2023-12-04 MED ORDER — BISACODYL 5 MG PO TBEC: 5.0000 mg | DELAYED_RELEASE_TABLET | Freq: Every day | ORAL | Status: DC | PRN

## 2023-12-04 MED ORDER — AQUACEL AG ADVANTAGE 4"X5" EX PADS: 1.0000 | MEDICATED_PAD | Freq: Two times a day (BID) | CUTANEOUS | Status: DC

## 2023-12-04 MED ORDER — ACETAMINOPHEN 325 MG PO TABS
650.0000 mg | ORAL_TABLET | Freq: Four times a day (QID) | ORAL | Status: DC | PRN
  Administered 2023-12-06 – 2023-12-09 (×3): 650 mg via ORAL
  Filled 2023-12-04 (×3): qty 2

## 2023-12-04 MED ORDER — CELECOXIB 200 MG PO CAPS
200.0000 mg | ORAL_CAPSULE | Freq: Every day | ORAL | Status: DC
  Administered 2023-12-04 – 2023-12-09 (×6): 200 mg via ORAL
  Filled 2023-12-04 (×6): qty 1

## 2023-12-04 MED ORDER — DIAZEPAM 5 MG PO TABS
5.0000 mg | ORAL_TABLET | Freq: Four times a day (QID) | ORAL | Status: DC | PRN
  Administered 2023-12-04 – 2023-12-07 (×2): 5 mg via ORAL
  Filled 2023-12-04 (×2): qty 1

## 2023-12-04 MED ORDER — OXYCODONE HCL 5 MG PO TABS: 10.0000 mg | ORAL_TABLET | ORAL | Status: DC | PRN

## 2023-12-04 NOTE — H&P (Signed)
History and Physical    Misty Arnold FAO:[id number] DOB: 12/16/1970 DOA: 12/04/2023  PCP: Serena Croissant, MD  Patient coming from: Home  I have personally briefly reviewed patient's old medical records in [facility]  Chief Complaint: Uncontrolled chest wall pain  HPI: Lynita Groseclose is a 53 y.o. female with medical history significant for metastatic right breast cancer (s/p mastectomy, palliative radiation/chemotherapy, currently on Keytruda), chronic fungating right chest wall mass, spinal metastases (s/p T7-T10 decompressive laminectomy), chronic cancer associated pain, depression/anxiety who presented to the ED for evaluation of uncontrolled cancer associated pain.  Patient reports chronic cancer associated pain related to her large right chest wall wound as well as chronic back pain from her spinal metastases.  She notes that her pain control worsens during cold weather and precipitation and has been uncontrolled recently due to the weather.  She is prescribed Xtampza 9 mg every 8 hours, oxycodone 10 mg q3h prn, and Celebrex 200 mg nightly without adequate relief recently.  Patient states that on good days she is ambulatory on her own power but will use a cane/walker occasionally as needed.  Recently her mobility has diminished due to her uncontrolled pain.  She does admit to thoughts of conditional suicidal ideation related to her uncontrolled pain.  She says she does not have these thoughts when her pain is adequately controlled.  She also reports that her appetite has been diminished.  She has been taking Megace which initially did help but she does not feel it has been helping as much lately.  She reports having regular bowel movements with use of MiraLAX.  ED Course  Labs/Imaging on admission: I have personally reviewed following labs and imaging studies.  Initial vitals showed BP 150/101, pulse 114, RR 20, temp 97.9 F, SpO2 98% on room air.  Labs showed WBC 7.8,  hemoglobin 11.7, platelets 449,000, sodium 138, potassium 3.8, bicarb 24, BUN 11, creatinine 1.08, serum glucose 141, AST 91, ALT 70, alk phos 91, total bilirubin 0.4.  Serum ethanol, acetaminophen, salicylate levels undetectable.  UA negative for UTI.  UDS positive for opiates and THC.  MRI L-spine with and without contrast IMPRESSION: 1. Partially imaged numerous liver lesions, concerning for metastases and new since September 02, 2023. Recommend dedicated CT or MRI of the abdomen with contrast for further evaluation. 2. T12 vertebral body lesion does not have typical characteristics of a metastasis and does not enhance; however, findings are suspicious for metastatic disease (maybe treated) given this lesion is new since prior MRI lumbar spine and given bony findings on recent CT of the chest. Atypical hemangioma is a consideration. A six-month follow-up MRI with contrast could ensure stability if clinically warranted. 3. Otherwise, somewhat diffusely heterogeneous bone marrow without definite other suspicious bone lesion. 4. No evidence of extraosseous extension of tumor or significant canal or foraminal stenosis.  Patient was given IV Dilaudid 1 mg x 3.  The hospitalist service was consulted to admit for further management of intractable cancer associated pain.   Review of Systems: All systems reviewed and are negative except as documented in history of present illness above.   Past Medical History:  Diagnosis Date   Abnormal uterine bleeding    Anemia    Breast cancer (HCC)    Depression    Fibroid    Hormone disorder    Hypertension    Infertility, female     Past Surgical History:  Procedure Laterality Date   ABDOMINAL HYSTERECTOMY     BREAST SURGERY  LAMINECTOMY N/A 01/04/2020   Procedure: Thoracic Eight, Thoracic Nine THORACIC LAMINECTOMY FOR TUMOR with Thoracic Seven-Thoracic Ten instrumentation;  Surgeon: Julio Sicks, MD;  Location: [facility] OR;  Service: Neurosurgery;   Laterality: N/A;  Thoracic Eight, Thoracic Nine THORACIC LAMINECTOMY FOR TUMOR with Thoracic Seven-Thoracic Ten instrumentation   MASTECTOMY     salivary gland stone      Social History:  reports that she has never smoked. She has never used smokeless tobacco. She reports that she does not currently use alcohol. She reports that she does not use drugs.  Allergies  Allergen Reactions   Amoxicillin     Other reaction(s): rash/itching   Penicillins Rash    Did it involve swelling of the face/tongue/throat, SOB, or low BP? no Did it involve sudden or severe rash/hives, skin peeling, or any reaction on the inside of your mouth or nose? Yes Did you need to seek medical attention at a hospital or doctor's office? No When did it last happen? early 20's     If all above answers are "NO", may proceed with cephalosporin use.   Pork-Derived Products    Penicillin G    Tamoxifen Hypertension   Tramadol Other (See Comments)    Increases her blood pressure    Family History  Problem Relation Age of Onset   Stroke Mother    Heart attack Mother    Diabetes Mother    Hyperlipidemia Mother    Heart disease Mother    Diabetes Father    Stroke Father    Stroke Maternal Grandmother    Cancer Maternal Grandfather    Colon cancer Maternal Grandfather    Stroke Maternal Grandfather    Stroke Paternal Grandmother    Heart attack Paternal Grandmother    Heart failure Paternal Grandmother    Diabetes Paternal Grandmother    Hyperlipidemia Paternal Grandmother    CAD Paternal Grandmother    Cancer Paternal Grandfather    Colon cancer Paternal Grandfather    Cancer Maternal Aunt 27       breast   Stroke Maternal Aunt    Heart attack Maternal Aunt    Breast cancer Maternal Aunt      Prior to Admission medications   Medication Sig Start Date End Date Taking? Authorizing Provider  atenolol (TENORMIN) 50 MG tablet Take 1 tablet (50 mg total) by mouth 2 (two) times daily. 04/24/22   Serena Croissant, MD  celecoxib (CELEBREX) 200 MG capsule Take 1 capsule (200 mg total) by mouth at bedtime. 10/11/23   Serena Croissant, MD  Cholecalciferol (VITAMIN D3) 125 MCG (5000 UT) CAPS Take 1 capsule (5,000 Units total) by mouth daily. 10/16/21   Edsel Petrin, DO  diazepam (VALIUM) 5 MG tablet Take 1 tablet (5 mg total) by mouth every 6 (six) hours as needed for muscle spasms. 10/11/23   Serena Croissant, MD  megestrol (MEGACE ES) 625 MG/5ML suspension Take 5 mLs (625 mg total) by mouth daily. 10/11/23   Serena Croissant, MD  metroNIDAZOLE (METROGEL) 0.75 % gel Apply topically 2 (two) times daily. 11/14/23   Serena Croissant, MD  Multiple Vitamins-Minerals (MULTIVITAMIN WITH MINERALS) tablet FLORADIX Supplement-Iron +minerals 10/16/21   Anderson Malta L, DO  ondansetron (ZOFRAN) 8 MG tablet Take 1 tablet (8 mg total) by mouth every 8 (eight) hours as needed for nausea or vomiting. 11/14/23   Rachel Moulds, MD  oxyCODONE ER (XTAMPZA ER) 9 MG C12A Take 1 capsule (9 mg) by mouth every 8 (eight) hours. 12/02/23  Pickenpack-Cousar, Arty Baumgartner, NP  Oxycodone HCl 10 MG TABS Take 1 tablet (10 mg total) by mouth every 3 (three) hours as needed ((score 7 to 10)). 11/11/23   Pickenpack-Cousar, Arty Baumgartner, NP  polyethylene glycol (MIRALAX / GLYCOLAX) 17 g packet Take 17 g by mouth daily. 10/16/21   Edsel Petrin, DO  potassium chloride SA (KLOR-CON M) 20 MEQ tablet Take 1 tablet by mouth daily. 06/26/22   Serena Croissant, MD  prochlorperazine (COMPAZINE) 10 MG tablet Take 1 tablet (10 mg total) by mouth every 6 (six) hours as needed for nausea or vomiting. 11/14/23   Rachel Moulds, MD  Silver-Carboxymethylcellulose (AQUACEL AG ADVANTAGE) 4"X5" PADS Apply 1 Pad topically in the morning and at bedtime. 07/03/23   Serena Croissant, MD    Physical Exam: Vitals:   12/04/23 1700 12/04/23 2000 12/04/23 2030 12/04/23 2106  BP: 123/88 (!) 132/91 (!) 148/99 (!) 146/86  Pulse: 95 (!) 103 (!) 104 (!) 115  Resp: 18 17    Temp:  98  F (36.7 C)    TempSrc:  Oral    SpO2: 99% 92% 96% 100%  Weight:      Height:       Constitutional: Thin woman resting in bed Eyes: EOMI, lids and conjunctivae normal ENMT: Mucous membranes are moist. Posterior pharynx clear of any exudate or lesions.Normal dentition.  Neck: normal, supple, no masses. Respiratory: Diminished breath sounds right lung base otherwise clear to auscultation bilaterally, no wheezing, no crackles. Normal respiratory effort. No accessory muscle use.  Cardiovascular: Tachycardic, no murmurs / rubs / gallops. No extremity edema. 2+ pedal pulses. Abdomen: no masses palpated.  Musculoskeletal: no clubbing / cyanosis. Good ROM, no contractures. Normal muscle tone.  Skin: Large wound right chest wall Neurologic: Sensation intact. Strength 5/5 in all 4.  Psychiatric: Alert and oriented x 3.  Endorses situational suicidal ideation related to uncontrolled pain.  EKG: Personally reviewed. Sinus tachycardia, rate 110, no acute ischemic changes.  Rate is slower when compared to prior.  Assessment/Plan Principal Problem:   Cancer associated pain Active Problems:   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)   Essential hypertension   Wound due to malignant neoplastic disease (HCC)   Rickey Farrier is a 52 y.o. female with medical history significant for metastatic right breast cancer (s/p mastectomy, palliative radiation/chemotherapy, currently on Keytruda), chronic fungating right chest wall mass, spinal metastases (s/p T7-T10 decompressive laminectomy), chronic cancer associated pain, depression/anxiety who is admitted for pain control.  Assessment and Plan: Intractable cancer associated pain: Patient reports worsening pain control despite her home pain regimen.  Pain is related to her large right chest wall wound and back pain related to her spinal metastases. -Continue OxyContin 10 mg q8h -Continue Oxy IR 10 mg q3h prn -Continue Celebrex 200 mg  nightly -Tylenol as needed -IV Dilaudid prn for uncontrolled/breakthrough pain not relieved with above oral meds -Continue bowel regimen with MiraLAX -Consult to palliative care for symptom management  Metastatic right breast cancer, widespread osseous metastasis including to spine (s/p T7-T10 decompressive laminectomy 2021): Follows with oncology Dr. Pamelia Hoit.  S/p mastectomy and palliative radiation/chemotherapy.  Current treatment is with Keytruda.  Numerous liver lesions incidentally seen on MRI L-spine concerning for metastatic disease; these have been noted on prior CT imaging.  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  She does her own dressing changes with Flagyl, continue.  Depression/anxiety/suicidal ideation: Patient admits to situational depression/suicidal  ideation related to episodes of severely uncontrolled cancer associated pain. -Continue SI precautions/safety sitter -Continue Valium 5 mg q6h prn -Pain management as above  Hypertension: Continue atenolol.   DVT prophylaxis: SCDs Start: 12/04/23 2132 Code Status: Full code Family Communication: Discussed with patient, she has discussed with family Disposition Plan: From home and likely discharge to home pending clinical progress Consults called: None Severity of Illness: The appropriate patient status for this patient is OBSERVATION. Observation status is judged to be reasonable and necessary in order to provide the required intensity of service to ensure the patient's safety. The patient's presenting symptoms, physical exam findings, and initial radiographic and laboratory data in the context of their medical condition is felt to place them at decreased risk for further clinical deterioration. Furthermore, it is anticipated that the patient will be medically stable for discharge from the hospital within 2 midnights of admission.   Darreld Mclean MD [facility]  If 7PM-7AM, please contact night-coverage [email or web address]  12/04/2023, 9:51 PM

## 2023-12-04 NOTE — Hospital Course (Addendum)
53 y.o. female with medical history significant for metastatic right breast cancer (s/p mastectomy, palliative radiation/chemotherapy, currently on Keytruda), chronic fungating right chest wall mass, spinal metastases (s/p T7-T10 decompressive laminectomy), chronic cancer associated pain, depression/anxiety who is admitted for pain control. She was taking Xtampza oxycodone and Celebrex which has not provided her any pain relief at home she even thought of suicide due to ongoing pain. She was seen by palliative care and psych during this admission. Psych signed off as they felt she is not a threat to herself or others. She remains a full code. 2/3  Overnight afebrile BP stable on room air.  Continue increased dose of OxyContin at 50 mg every 8 hours steroids along with as needed oral and IV pain management.

## 2023-12-04 NOTE — ED Notes (Signed)
Patient transported to MRI with sitter and MRI Tech.

## 2023-12-04 NOTE — ED Provider Notes (Signed)
[facility] EMERGENCY DEPARTMENT AT [facility] Provider Note  CSN: [id number] Arrival date & time: 12/04/23 1047  Chief Complaint(s) Back Pain and Suicidal  HPI Misty Arnold is a 53 y.o. female with PMH metastatic breast cancer with fungating chest wall mass, chronic back pain status post multiple surgeries who presents emergency department for evaluation of back and chest pain.  Patient states that she is on multiple pain control modalities at home including long-acting oxycodone and short acting oxycodone and her pain has been breaking through this.  She endorses conditional suicidality surrounding the pain, stating that she would not feel suicidal if her pain was under control.  Endorses intermittent urinary retention but denies saddle anesthesia.  Endorses pain around the chest wall mass but no signs of shortness of breath.  Denies numbness, tingling, weakness or other systematic complaints.   Past Medical History Past Medical History:  Diagnosis Date   Abnormal uterine bleeding    Anemia    Breast cancer (HCC)    Depression    Fibroid    Hormone disorder    Hypertension    Infertility, female    Patient Active Problem List   Diagnosis Date Noted   Intractable pain 08/18/2023   Port-A-Cath in place 02/19/2022   Wound due to malignant neoplastic disease (HCC) 10/12/2021   Malnutrition of moderate degree 10/05/2021   Breast wound 08/28/2021   Infected wound 08/26/2021   Essential hypertension 08/26/2021   Wound infection 05/12/2021   Cancer related pain 05/12/2021   AKI (acute kidney injury) (HCC) 05/11/2021   Palliative care patient 02/15/2020   Myelopathy (HCC) 01/08/2020   Incomplete paraplegia (HCC) 01/08/2020   Epidural mass    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)    Weakness of both legs    Leucocytosis    Acute blood loss anemia    Neurogenic bladder    Neurogenic bowel    S/P laminectomy 01/04/2020   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 Authorizing Provider  atenolol (TENORMIN) 50 MG tablet Take 1 tablet (50 mg total) by mouth 2 (two) times daily. 04/24/22   Serena Croissant, MD  celecoxib (CELEBREX) 200 MG capsule Take 1 capsule (200 mg total) by mouth at bedtime. 10/11/23   Serena Croissant, MD  Cholecalciferol (VITAMIN D3) 125 MCG (5000 UT) CAPS Take 1 capsule (5,000 Units total) by mouth daily. 10/16/21   Edsel Petrin, DO  diazepam (VALIUM) 5 MG tablet Take 1 tablet (5 mg total) by mouth every 6 (six) hours as needed for muscle spasms. 10/11/23   Serena Croissant, MD  megestrol (MEGACE ES) 625 MG/5ML suspension Take 5 mLs (625 mg total) by mouth daily. 10/11/23   Serena Croissant, MD  metroNIDAZOLE (METROGEL) 0.75 % gel Apply topically 2 (two) times daily. 11/14/23   Serena Croissant, MD  Multiple Vitamins-Minerals (MULTIVITAMIN WITH MINERALS) tablet FLORADIX Supplement-Iron +minerals 10/16/21   Anderson Malta L, DO  ondansetron (ZOFRAN) 8 MG tablet Take 1 tablet (8 mg total) by mouth every 8 (eight) hours as needed for nausea or vomiting. 11/14/23   Rachel Moulds, MD  oxyCODONE ER (XTAMPZA ER) 9 MG C12A Take 1 capsule (9 mg) by mouth every 8 (eight) hours. 12/02/23   Pickenpack-Cousar, Arty Baumgartner, NP  Oxycodone HCl 10 MG TABS Take 1 tablet (10 mg  total) by mouth every 3 (three) hours as needed ((score 7 to 10)). 11/11/23   Pickenpack-Cousar, Arty Baumgartner, NP  polyethylene glycol (MIRALAX / GLYCOLAX) 17 g packet Take 17 g by mouth daily. 10/16/21   Edsel Petrin, DO  potassium chloride SA (KLOR-CON M) 20 MEQ tablet Take 1 tablet by mouth daily. 06/26/22   Serena Croissant, MD  prochlorperazine (COMPAZINE) 10 MG tablet Take 1 tablet (10 mg total) by mouth every 6 (six) hours as needed for nausea or vomiting. 11/14/23   Rachel Moulds, MD   Silver-Carboxymethylcellulose (AQUACEL AG ADVANTAGE) 4"X5" PADS Apply 1 Pad topically in the morning and at bedtime. 07/03/23   Serena Croissant, MD                                                                                                                                    Past Surgical History Past Surgical History:  Procedure Laterality Date   ABDOMINAL HYSTERECTOMY     BREAST SURGERY     LAMINECTOMY N/A 01/04/2020   Procedure: Thoracic Eight, Thoracic Nine THORACIC LAMINECTOMY FOR TUMOR with Thoracic Seven-Thoracic Ten instrumentation;  Surgeon: Julio Sicks, MD;  Location: [facility] OR;  Service: Neurosurgery;  Laterality: N/A;  Thoracic Eight, Thoracic Nine THORACIC LAMINECTOMY FOR TUMOR with Thoracic Seven-Thoracic Ten instrumentation   MASTECTOMY     salivary gland stone     Family History Family History  Problem Relation Age of Onset   Stroke Mother    Heart attack Mother    Diabetes Mother    Hyperlipidemia Mother    Heart disease Mother    Diabetes Father    Stroke Father    Stroke Maternal Grandmother    Cancer Maternal Grandfather    Colon cancer Maternal Grandfather    Stroke Maternal Grandfather    Stroke Paternal Grandmother    Heart attack Paternal Grandmother    Heart failure Paternal Grandmother    Diabetes Paternal Grandmother    Hyperlipidemia Paternal Grandmother    CAD Paternal Grandmother    Cancer Paternal Grandfather    Colon cancer Paternal Grandfather    Cancer Maternal Aunt 27       breast   Stroke Maternal Aunt    Heart attack Maternal Aunt    Breast cancer Maternal Aunt     Social History Social History   Tobacco Use   Smoking status: Never   Smokeless tobacco: Never  Vaping Use   Vaping status: Never Used  Substance Use Topics   Alcohol use: Not Currently   Drug use: No   Allergies Amoxicillin, Penicillins, Pork-derived products, Penicillin g, Tamoxifen, and Tramadol  Review of Systems Review of Systems  Cardiovascular:  Positive for  chest pain.  Musculoskeletal:  Positive for back pain.    Physical Exam Vital Signs  I have reviewed the triage vital signs BP 134/89 (BP Location: Left Arm)   Pulse (!) 112   Temp 97.9 F (  36.6 C) (Oral)   Resp 18   Ht 5\' 5"  (1.651 m)   Wt 49.9 kg   SpO2 98%   BMI 18.30 kg/m   Physical Exam Vitals and nursing note reviewed.  Constitutional:      General: She is not in acute distress.    Appearance: She is well-developed.  HENT:     Head: Normocephalic and atraumatic.  Eyes:     Conjunctiva/sclera: Conjunctivae normal.  Cardiovascular:     Rate and Rhythm: Normal rate and regular rhythm.     Heart sounds: No murmur heard. Pulmonary:     Effort: Pulmonary effort is normal. No respiratory distress.     Breath sounds: Normal breath sounds.  Abdominal:     Palpations: Abdomen is soft.     Tenderness: There is no abdominal tenderness.  Musculoskeletal:        General: Tenderness present. No swelling.     Cervical back: Neck supple.  Skin:    General: Skin is warm and dry.     Capillary Refill: Capillary refill takes less than 2 seconds.     Findings: Lesion present.  Neurological:     Mental Status: She is alert.     Cranial Nerves: No cranial nerve deficit.     Sensory: No sensory deficit.     Motor: No weakness.  Psychiatric:        Mood and Affect: Mood normal.     ED Results and Treatments Labs (all labs ordered are listed, but only abnormal results are displayed) Labs Reviewed  COMPREHENSIVE METABOLIC PANEL - Abnormal; Notable for the following components:      Result Value   Glucose, Bld 141 (*)    Creatinine, Ser 1.08 (*)    Albumin 3.2 (*)    AST 91 (*)    ALT 70 (*)    All other components within normal limits  RAPID URINE DRUG SCREEN, HOSP PERFORMED - Abnormal; Notable for the following components:   Opiates POSITIVE (*)    Tetrahydrocannabinol POSITIVE (*)    All other components within normal limits  CBC WITH DIFFERENTIAL/PLATELET -  Abnormal; Notable for the following components:   Hemoglobin 11.7 (*)    RDW 16.2 (*)    Platelets 449 (*)    All other components within normal limits  ACETAMINOPHEN LEVEL - Abnormal; Notable for the following components:   Acetaminophen (Tylenol), Serum <10 (*)    All other components within normal limits  SALICYLATE LEVEL - Abnormal; Notable for the following components:   Salicylate Lvl <7.0 (*)    All other components within normal limits  URINALYSIS, ROUTINE W REFLEX MICROSCOPIC - Abnormal; Notable for the following components:   Protein, ur 30 (*)    All other components within normal limits  ETHANOL                                                                                                                          Radiology  No results found.  Pertinent labs & imaging results that were available during my care of the patient were reviewed by me and considered in my medical decision making (see MDM for details).  Medications Ordered in ED Medications  HYDROmorphone (DILAUDID) injection 1 mg (has no administration in time range)                                                                                                                                     Procedures Procedures  (including critical care time)  Medical Decision Making / ED Course   This patient presents to the ED for concern of back pain, chest wall pain, this involves an extensive number of treatment options, and is a complaint that carries with it a high risk of complications and morbidity.  The differential diagnosis includes muscular strain/spasm, lumbago, disc herniation, spinal fracture, cauda equina, epidural abscess or hematoma, psoas abscess, pyelonephritis,  MDM: Patient seen emergency room for evaluation of back and chest pain.  Physical exam with a fungating breast mass over the right chest wall with an overlying pyoderma like wound.  Tenderness to palpation in the L-spine.  Neurologic exam  unremarkable.  Laboratory evaluation unremarkable outside of a hemoglobin 11.7.  Due to concern for pathologic fracture or mets from her breast cancer, an MR of the L-spine that is negative for new acute fracture, possible met/may be treated met in T12 but is otherwise unremarkable.  Patient requiring multiple doses of pain medication and patient's pain remains uncontrolled.  Will require hospital admission for uncontrolled cancer pain.  In regards to the patient's conditional suicidality, if pain was under control she voiced that she would not be suicidal and this does not meet criteria for emergency psychiatric services at this time.  Patient then admitted   Additional history obtained: -Additional history obtained from family -External records from outside source obtained and reviewed including: Chart review including previous notes, labs, imaging, consultation notes   Lab Tests: -I ordered, reviewed, and interpreted labs.   The pertinent results include:   Labs Reviewed  COMPREHENSIVE METABOLIC PANEL - Abnormal; Notable for the following components:      Result Value   Glucose, Bld 141 (*)    Creatinine, Ser 1.08 (*)    Albumin 3.2 (*)    AST 91 (*)    ALT 70 (*)    All other components within normal limits  RAPID URINE DRUG SCREEN, HOSP PERFORMED - Abnormal; Notable for the following components:   Opiates POSITIVE (*)    Tetrahydrocannabinol POSITIVE (*)    All other components within normal limits  CBC WITH DIFFERENTIAL/PLATELET - Abnormal; Notable for the following components:   Hemoglobin 11.7 (*)    RDW 16.2 (*)    Platelets 449 (*)    All other components within normal limits  ACETAMINOPHEN LEVEL - Abnormal; Notable for the following components:   Acetaminophen (Tylenol), Serum <10 (*)  All other components within normal limits  SALICYLATE LEVEL - Abnormal; Notable for the following components:   Salicylate Lvl <7.0 (*)    All other components within normal limits   URINALYSIS, ROUTINE W REFLEX MICROSCOPIC - Abnormal; Notable for the following components:   Protein, ur 30 (*)    All other components within normal limits  ETHANOL      EKG   EKG Interpretation Date/Time:  Wednesday December 04 2023 13:51:29 EST Ventricular Rate:  110 PR Interval:  122 QRS Duration:  70 QT Interval:  296 QTC Calculation: 400 R Axis:   60  Text Interpretation: Sinus tachycardia Low voltage QRS Nonspecific T wave abnormality Abnormal ECG When compared with ECG of 22-Nov-2023 20:48, PREVIOUS ECG IS PRESENT Confirmed by Denim Start ([id number]) on 12/04/2023 9:34:07 PM         Imaging Studies ordered: I ordered imaging studies including MRI L-spine I independently visualized and interpreted imaging. I agree with the radiologist interpretation   Medicines ordered and prescription drug management: Meds ordered this encounter  Medications   HYDROmorphone (DILAUDID) injection 1 mg    -I have reviewed the patients home medicines and have made adjustments as needed  Critical interventions none   Cardiac Monitoring: The patient was maintained on a cardiac monitor.  I personally viewed and interpreted the cardiac monitored which showed an underlying rhythm of: NSR  Social Determinants of Health:  Factors impacting patients care include: none   Reevaluation: After the interventions noted above, I reevaluated the patient and found that they have :improved  Co morbidities that complicate the patient evaluation  Past Medical History:  Diagnosis Date   Abnormal uterine bleeding    Anemia    Breast cancer (HCC)    Depression    Fibroid    Hormone disorder    Hypertension    Infertility, female       Dispostion: I considered admission for this patient, and patient require hospital admission for uncontrolled cancer pain     Final Clinical Impression(s) / ED Diagnoses Final diagnoses:  None     @PCDICTATION @    Glendora Score, MD 12/04/23  [id number]

## 2023-12-04 NOTE — ED Notes (Signed)
Spoke with pt, pt tearful, and stated they are having a really hard time and needs help. Per her clinic pt has been endorsing SI.

## 2023-12-04 NOTE — ED Provider Triage Note (Signed)
Emergency Medicine Provider Triage Evaluation Note  Misty Arnold , a 53 y.o. female  was evaluated in triage.  Pt complains of chronic low back pain, chronic right breast/chest wound pain, complains of SI, down depression and hopeless secondary to pain, feels exhaustion over pain and continued fight against breast cancer. Patient tearful in room.  Has very large right anterior chest wound, there is drainage, patient's on palliative care and reports frequent monitoring of wound.  Denies any fever chills or acute change in wound.  Review of Systems  Positive: Low back pain, chest wound Negative: Abdominal pain  Physical Exam  BP (!) 150/101 (BP Location: Left Arm)   Pulse (!) 114   Temp 97.9 F (36.6 C) (Oral)   Resp 20   Ht 5\' 5"  (1.651 m)   Wt 49.9 kg   SpO2 98%   BMI 18.30 kg/m  Gen:   Awake, no distress   Resp:  Normal effort  MSK:   Moves extremities without difficulty  Other:    Medical Decision Making  Medically screening exam initiated at 12:14 PM.  Appropriate orders placed.  Misty Arnold was informed that the remainder of the evaluation will be completed by another provider, this initial triage assessment does not replace that evaluation, and the importance of remaining in the ED until their evaluation is complete.     Smitty Knudsen, PA-C 12/04/23 1217

## 2023-12-04 NOTE — Telephone Encounter (Signed)
Received call from pt stating she is currently in the ED waiting room for evaluation of ongoing pain due to large chest wound as well as suicidal ideations. Pt states she alerted staff of her suicidal ideations but has not been evaluated yet and is starting to experience anxiety in the waiting room.  RN reached out to ED charge RN to inform them of pt situation.

## 2023-12-04 NOTE — ED Notes (Signed)
Redressed wound on R side breast, applied metronidazole cream, rinsed and replaced abd pads and patient provided binder

## 2023-12-04 NOTE — ED Triage Notes (Signed)
Patient to ED by POV with c/o back pain and wound to R breast. Back pain is chronic states it is caused by tumor in 2021 had SX currently has 8 screw in spine. The wound occurred in 10/2010. She has HH that comes in and treat wound. She denies N/V/diarrhea, fever or chills.

## 2023-12-04 NOTE — ED Notes (Addendum)
Patient denied SI/HI to RN. RN was informed by another RN that cares for patient that she has voiced HI/SI. RN reassessed patient but patient still denies SI/HI.

## 2023-12-05 ENCOUNTER — Encounter (HOSPITAL_COMMUNITY): Payer: Self-pay | Admitting: Internal Medicine

## 2023-12-05 DIAGNOSIS — Z515 Encounter for palliative care: Secondary | ICD-10-CM | POA: Diagnosis not present

## 2023-12-05 DIAGNOSIS — Z8249 Family history of ischemic heart disease and other diseases of the circulatory system: Secondary | ICD-10-CM | POA: Diagnosis not present

## 2023-12-05 DIAGNOSIS — M545 Low back pain, unspecified: Secondary | ICD-10-CM | POA: Diagnosis present

## 2023-12-05 DIAGNOSIS — Z853 Personal history of malignant neoplasm of breast: Secondary | ICD-10-CM | POA: Diagnosis not present

## 2023-12-05 DIAGNOSIS — E876 Hypokalemia: Secondary | ICD-10-CM | POA: Diagnosis not present

## 2023-12-05 DIAGNOSIS — G893 Neoplasm related pain (acute) (chronic): Secondary | ICD-10-CM | POA: Diagnosis present

## 2023-12-05 DIAGNOSIS — F331 Major depressive disorder, recurrent, moderate: Secondary | ICD-10-CM | POA: Diagnosis present

## 2023-12-05 DIAGNOSIS — Z800 Family history of malignant neoplasm of digestive organs: Secondary | ICD-10-CM | POA: Diagnosis not present

## 2023-12-05 DIAGNOSIS — Z833 Family history of diabetes mellitus: Secondary | ICD-10-CM | POA: Diagnosis not present

## 2023-12-05 DIAGNOSIS — Z79899 Other long term (current) drug therapy: Secondary | ICD-10-CM | POA: Diagnosis not present

## 2023-12-05 DIAGNOSIS — Z79891 Long term (current) use of opiate analgesic: Secondary | ICD-10-CM | POA: Diagnosis not present

## 2023-12-05 DIAGNOSIS — Z83438 Family history of other disorder of lipoprotein metabolism and other lipidemia: Secondary | ICD-10-CM | POA: Diagnosis not present

## 2023-12-05 DIAGNOSIS — G4700 Insomnia, unspecified: Secondary | ICD-10-CM | POA: Diagnosis present

## 2023-12-05 DIAGNOSIS — I10 Essential (primary) hypertension: Secondary | ICD-10-CM | POA: Diagnosis present

## 2023-12-05 DIAGNOSIS — C7951 Secondary malignant neoplasm of bone: Secondary | ICD-10-CM | POA: Diagnosis present

## 2023-12-05 DIAGNOSIS — C792 Secondary malignant neoplasm of skin: Secondary | ICD-10-CM | POA: Diagnosis present

## 2023-12-05 DIAGNOSIS — Z823 Family history of stroke: Secondary | ICD-10-CM | POA: Diagnosis not present

## 2023-12-05 DIAGNOSIS — Z880 Allergy status to penicillin: Secondary | ICD-10-CM | POA: Diagnosis not present

## 2023-12-05 DIAGNOSIS — Z9011 Acquired absence of right breast and nipple: Secondary | ICD-10-CM | POA: Diagnosis not present

## 2023-12-05 DIAGNOSIS — F4321 Adjustment disorder with depressed mood: Secondary | ICD-10-CM | POA: Diagnosis present

## 2023-12-05 DIAGNOSIS — F419 Anxiety disorder, unspecified: Secondary | ICD-10-CM | POA: Diagnosis present

## 2023-12-05 DIAGNOSIS — Z791 Long term (current) use of non-steroidal anti-inflammatories (NSAID): Secondary | ICD-10-CM | POA: Diagnosis not present

## 2023-12-05 DIAGNOSIS — D72829 Elevated white blood cell count, unspecified: Secondary | ICD-10-CM | POA: Diagnosis present

## 2023-12-05 DIAGNOSIS — F32A Depression, unspecified: Secondary | ICD-10-CM | POA: Diagnosis not present

## 2023-12-05 DIAGNOSIS — R45851 Suicidal ideations: Secondary | ICD-10-CM | POA: Diagnosis present

## 2023-12-05 LAB — COMPREHENSIVE METABOLIC PANEL
ALT: 56 U/L — ABNORMAL HIGH (ref 0–44)
AST: 76 U/L — ABNORMAL HIGH (ref 15–41)
Albumin: 2.9 g/dL — ABNORMAL LOW (ref 3.5–5.0)
Alkaline Phosphatase: 77 U/L (ref 38–126)
Anion gap: 8 (ref 5–15)
BUN: 11 mg/dL (ref 6–20)
CO2: 25 mmol/L (ref 22–32)
Calcium: 10.7 mg/dL — ABNORMAL HIGH (ref 8.9–10.3)
Chloride: 105 mmol/L (ref 98–111)
Creatinine, Ser: 1.04 mg/dL — ABNORMAL HIGH (ref 0.44–1.00)
GFR, Estimated: >60 mL/min (ref >60)
Glucose, Bld: 109 mg/dL — ABNORMAL HIGH (ref 70–99)
Potassium: 3.7 mmol/L (ref 3.5–5.1)
Sodium: 138 mmol/L (ref 135–145)
Total Bilirubin: 0.5 mg/dL (ref 0.0–1.2)
Total Protein: 6.7 g/dL (ref 6.5–8.1)

## 2023-12-05 LAB — CBC
HCT: 33.7 % — ABNORMAL LOW (ref 36.0–46.0)
Hemoglobin: 10.4 g/dL — ABNORMAL LOW (ref 12.0–15.0)
MCH: 27.7 pg (ref 26.0–34.0)
MCHC: 30.9 g/dL (ref 30.0–36.0)
MCV: 89.9 fL (ref 80.0–100.0)
Platelets: 406 10*3/uL — ABNORMAL HIGH (ref 150–400)
RBC: 3.75 MIL/uL — ABNORMAL LOW (ref 3.87–5.11)
RDW: 16.1 % — ABNORMAL HIGH (ref 11.5–15.5)
WBC: 6.9 10*3/uL (ref 4.0–10.5)
nRBC: 0 % (ref 0.0–0.2)

## 2023-12-05 MED ORDER — BOOST / RESOURCE BREEZE PO LIQD CUSTOM
1.0000 | Freq: Three times a day (TID) | ORAL | Status: DC
  Administered 2023-12-05 – 2023-12-10 (×11): 1 via ORAL

## 2023-12-05 MED ORDER — MIRTAZAPINE 15 MG PO TABS
15.0000 mg | ORAL_TABLET | Freq: Every day | ORAL | Status: DC
  Administered 2023-12-05 – 2023-12-09 (×5): 15 mg via ORAL
  Filled 2023-12-05 (×5): qty 1

## 2023-12-05 MED ORDER — ZOLEDRONIC ACID 4 MG/100ML IV SOLN: 4.0000 mg | Freq: Once | INTRAVENOUS | Status: DC

## 2023-12-05 MED ORDER — DEXAMETHASONE SODIUM PHOSPHATE 10 MG/ML IJ SOLN
8.0000 mg | Freq: Once | INTRAMUSCULAR | Status: AC
  Administered 2023-12-05: 8 mg via INTRAVENOUS
  Filled 2023-12-05: qty 1

## 2023-12-05 MED ORDER — ZOLEDRONIC ACID 4 MG/5ML IV CONC
3.3000 mg | Freq: Once | INTRAVENOUS | Status: AC
  Administered 2023-12-05: 3.3 mg via INTRAVENOUS
  Filled 2023-12-05: qty 4.13

## 2023-12-05 MED ORDER — HYDROMORPHONE HCL 1 MG/ML IJ SOLN
1.0000 mg | INTRAMUSCULAR | Status: DC | PRN
  Administered 2023-12-05 – 2023-12-06 (×6): 1 mg via INTRAVENOUS
  Filled 2023-12-05 (×5): qty 1

## 2023-12-05 NOTE — ED Notes (Signed)
Spoke with the floor nurse and stated we could send the patient upstairs.

## 2023-12-05 NOTE — Consult Note (Signed)
                                                                                   Consultation Note Date: 12/05/2023   Patient Name: Misty Arnold  DOB: [birth year]  MRN: [id number]  Age / Sex: 53 y.o., female  PCP: Serena Croissant, MD Referring Physician: Fran Lowes, DO  Reason for Consultation: Pain control  HPI/Patient Profile: 53 y.o. female  with past medical history of metastatic breast cancer with malignant chest wall wound admitted on 12/04/2023 with uncontrolled cancer related pain.   Clinical Assessment and Goals of Care: [name] is sitting up in bed in no acute distress, she is engaged and interactive, makes good eye contact. She described pain in her chest wall and midback-her usual locations, just worse. Preventing her from eating and sleeping which further worsened her depression and coping ability. Reviewed MRI results-known bony metastatic disease, no compression or nerve root encroachment.  Her goals are for full scope medical interventions and aggressive treatment of pain and cancer related symptoms.  SUMMARY OF RECOMMENDATIONS    She is a full code, will need to have additional conversation about this specifically when her pain is controlled and she is open to conversation. Cancer Related Pain: Likely inflammatory-liver mets may also be contributing. Will give her short course of steroids- she is on Immunotherapy so lowest possible dose and shortest course possible to not interfere with anti-neoplastic benefits of her cancer treatment. Decadron 8mg  IV X1 today and then tomorrow will do 4mg , then a daily dose of 2mg  for 7-10 days. Her corrected calcium is slightly high and her albumin is 2.9.  Will give her a single dose of Zometa which may also help with bone pain. Continue her current oral regimen supplemented with IV hydromorphone-will likely need adjustment in her basal dose of Oxycontin/Xtampza. We know [name] well and will maintain close follow up while in the  hospital and maintain continuity. Nutritional Status- hopefully steroids will stimulate appetite, she has requested Boost Breeze supplement.  Anderson Malta, DO Palliative Medicine  Time: 60 minutes

## 2023-12-05 NOTE — Progress Notes (Signed)
PROGRESS NOTE  Misty Arnold ZOX:[id number] DOB: 07-24-1971 DOA: 12/04/2023 PCP: Serena Croissant, MD  Brief History   The patient is a 53 yr old woman who presented to [facility] ED on 12/04/2023 with complaints of uncontrolled pain related to her metastatic right breast cancer. She has had a mastectomy and palliative radiation and chemotherapy. She is currently on Keytruda. She has a chronic fungative breast wall mass.   At home that patient had been receiving Xtampza 9 mg Q 8 hours, Oxycodone 10 mg q 3 hours, and Celebrex 200 mg daily. This has not provided her with sufficient relief lately. She has also had thoughts of suicidal ideation due to her uncontrolled pain.  The patient has been admitted with suicide precautions. Palliative care and Psychiatry were consulted.  The patient states that her pain is about a 5 now. She states that it is improved, but not quite there yet. She denies suicidal intent if her pain can be controlled.   A & P  Assessment/Plan Principal Problem:   Cancer associated pain Active Problems:   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)   Essential hypertension   Wound due to malignant neoplastic disease (HCC)   Misty Arnold is a 53 y.o. female with medical history significant for metastatic right breast cancer (s/p mastectomy, palliative radiation/chemotherapy, currently on Keytruda), chronic fungating right chest wall mass, spinal metastases (s/p T7-T10 decompressive laminectomy), chronic cancer associated pain, depression/anxiety who is admitted for pain control.   Assessment and Plan: Intractable cancer associated pain: Patient reports worsening pain control despite her home pain regimen.  Pain is related to her large right chest wall wound and back pain related to her spinal metastases. -Continue OxyContin 10 mg q8h -Continue Oxy IR 10 mg q3h prn -Continue Celebrex 200 mg nightly -Tylenol as needed -IV Dilaudid prn for uncontrolled/breakthrough  pain not relieved with above oral meds -IV dilaudid prn dose added to be administered prior to the patient's bamdage changes for her fungating right breast mass. -Continue bowel regimen with MiraLAX -Consult to palliative care for symptom management   Metastatic right breast cancer, widespread osseous metastasis including to spine (s/p T7-T10 decompressive laminectomy 2021): Follows with oncology Dr. Pamelia Hoit.  S/p mastectomy and palliative radiation/chemotherapy.  Current treatment is with Keytruda.  Numerous liver lesions incidentally seen on MRI L-spine concerning for metastatic disease; these have been noted on prior CT imaging.   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  She does her own dressing changes with Flagyl, continue.   Depression/anxiety/suicidal ideation: Patient admits to situational depression/suicidal ideation related to episodes of severely uncontrolled cancer associated pain. -Continue SI precautions/safety sitter -Continue Valium 5 mg q6h prn -Pain management as above -Patient is admitted with suicide precautions -Psychiatry was consulted.  Hypertension: Continue atenolol.   DVT prophylaxis: SCDs Start: 12/04/23 2132 Code Status: Full code Family Communication: Discussed with patient, she has discussed with family Disposition Plan: From home and likely discharge to home pending clinical progress Consults called: None  I have seen and examined this patient myself. I have spent 34 minutes in her evaluation and care.   Misty Udall, DO [facility] Direct contact: see [email or web address]  7PM-7AM contact night coverage as above 12/05/2023, 3:44 PM  LOS: 0 days   Consultants  Psychiatry Palliative care  Procedures  None  Antibiotics   Anti-infectives (From admission, onward)    None      Interval History/Subjective  The patient  was resting comfortably. No new complaints. States  that her pain is better, but not well controlled yet.  Objective   Vitals:  Vitals:   12/05/23 1134 12/05/23 1533  BP: 136/83 130/86  Pulse: 69 87  Resp: 18 18  Temp: (!) 97.5 F (36.4 C) 97.6 F (36.4 C)  SpO2: 97% 93%    Exam:  Constitutional:  The patient is awake, alert, and oriented x 3. No acute distress. Respiratory:  No increased work of breathing. No wheezes, rales, or rhonchi No tactile fremitus Cardiovascular:  Regular rate and rhythm No murmurs, ectopy, or gallups. No lateral PMI. No thrills. Abdomen:  Abdomen is soft, non-tender, non-distended No hernias, masses, or organomegaly Normoactive bowel sounds.  Musculoskeletal:  No cyanosis, clubbing, or edema Skin:  No rashes, lesions, ulcers palpation of skin: no induration or nodules Neurologic:  CN 2-12 intact Sensation all 4 extremities intact Psychiatric:  Mental status Mood, affect appropriate Orientation to person, place, time  judgment and insight appear intact   I have personally reviewed the following:   Today's Data   Vitals:   12/05/23 1134 12/05/23 1533  BP: 136/83 130/86  Pulse: 69 87  Resp: 18 18  Temp: (!) 97.5 F (36.4 C) 97.6 F (36.4 C)  SpO2: 97% 93%     Lab Data  CBC    Component Value Date/Time   WBC 6.9 12/05/2023 0457   RBC 3.75 (L) 12/05/2023 0457   HGB 10.4 (L) 12/05/2023 0457   HGB 11.2 (L) 11/14/2023 0912   HGB 13.1 11/01/2017 1417   HCT 33.7 (L) 12/05/2023 0457   HCT 40.1 11/01/2017 1417   PLT 406 (H) 12/05/2023 0457   PLT 411 (H) 11/14/2023 0912   PLT 227 11/01/2017 1417   MCV 89.9 12/05/2023 0457   MCV 89.3 11/01/2017 1417   MCH 27.7 12/05/2023 0457   MCHC 30.9 12/05/2023 0457   RDW 16.1 (H) 12/05/2023 0457   RDW 16.0 (H) 11/01/2017 1417   LYMPHSABS 1.4 12/04/2023 1402   LYMPHSABS 2.5 11/01/2017 1417   MONOABS 0.4 12/04/2023 1402   MONOABS 0.7 11/01/2017 1417   EOSABS 0.0 12/04/2023 1402   EOSABS 0.1 11/01/2017 1417   BASOSABS 0.0  12/04/2023 1402   BASOSABS 0.0 11/01/2017 1417      Latest Ref Rng & Units 12/05/2023    4:57 AM 12/04/2023    2:02 PM 11/22/2023    9:16 PM  BMP  Glucose 70 - 99 mg/dL 409  811  914   BUN 6 - 20 mg/dL 11  11  10    Creatinine 0.44 - 1.00 mg/dL 7.82  9.56  2.13   Sodium 135 - 145 mmol/L 138  138  133   Potassium 3.5 - 5.1 mmol/L 3.7  3.8  3.7   Chloride 98 - 111 mmol/L 105  104  101   CO2 22 - 32 mmol/L 25  24  20    Calcium 8.9 - 10.3 mg/dL 08.6  57.8  46.9      Micro Data   Results for orders placed or performed during the hospital encounter of 11/22/23  Culture, blood (Routine x 2)     Status: None   Collection Time: 11/22/23  9:06 PM   Specimen: BLOOD  Result Value Ref Range Status   Specimen Description   Final    BLOOD RIGHT ANTECUBITAL Performed at [facility], [address], [place], [place] [address]    Special Requests   Final  BOTTLES DRAWN AEROBIC AND ANAEROBIC Blood Culture results may not be optimal due to an inadequate volume of blood received in culture bottles Performed at [facility], [address], [place], [place] [address]    Culture   Final    NO GROWTH 5 DAYS Performed at [facility], [address], [place], [place] [address]    Report Status 11/27/2023 FINAL  Final  Culture, blood (Routine x 2)     Status: None   Collection Time: 11/22/23  9:17 PM   Specimen: BLOOD  Result Value Ref Range Status   Specimen Description   Final    BLOOD BLOOD LEFT HAND Performed at [facility], [address] [place], [place] [address]    Special Requests   Final    BOTTLES DRAWN AEROBIC AND ANAEROBIC Blood Culture results may not be optimal due to an inadequate volume of blood received in culture bottles Performed at [facility], [address], [place], [place] [address]    Culture   Final    NO GROWTH 5 DAYS Performed at [facility], [address]  [place], [place] [address]    Report Status 11/27/2023 FINAL  Final   *Note: Due to a large number of results and/or encounters for the requested time period, some results have not been displayed. A complete set of results can be found in Results Review.    Scheduled Meds:  atenolol  50 mg Oral BID   celecoxib  200 mg Oral QHS   feeding supplement  1 Container Oral TID BM   metroNIDAZOLE   Topical BID   mirtazapine  15 mg Oral QHS   oxyCODONE  10 mg Oral Q8H   polyethylene glycol  17 g Oral Daily   Continuous Infusions:  Principal Problem:   Cancer associated pain Active Problems:   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)   Cancer related pain   Essential hypertension   Wound due to malignant neoplastic disease (HCC)   LOS: 0 days

## 2023-12-06 DIAGNOSIS — F419 Anxiety disorder, unspecified: Secondary | ICD-10-CM | POA: Diagnosis not present

## 2023-12-06 DIAGNOSIS — F32A Depression, unspecified: Secondary | ICD-10-CM

## 2023-12-06 DIAGNOSIS — F331 Major depressive disorder, recurrent, moderate: Secondary | ICD-10-CM | POA: Diagnosis present

## 2023-12-06 DIAGNOSIS — G893 Neoplasm related pain (acute) (chronic): Secondary | ICD-10-CM | POA: Diagnosis not present

## 2023-12-06 LAB — CBC WITH DIFFERENTIAL/PLATELET
Abs Immature Granulocytes: 0.09 10*3/uL — ABNORMAL HIGH (ref 0.00–0.07)
Basophils Absolute: 0 10*3/uL (ref 0.0–0.1)
Basophils Relative: 0 %
Eosinophils Absolute: 0 10*3/uL (ref 0.0–0.5)
Eosinophils Relative: 0 %
HCT: 34.3 % — ABNORMAL LOW (ref 36.0–46.0)
Hemoglobin: 10.6 g/dL — ABNORMAL LOW (ref 12.0–15.0)
Immature Granulocytes: 1 %
Lymphocytes Relative: 5 %
Lymphs Abs: 0.8 10*3/uL (ref 0.7–4.0)
MCH: 27.4 pg (ref 26.0–34.0)
MCHC: 30.9 g/dL (ref 30.0–36.0)
MCV: 88.6 fL (ref 80.0–100.0)
Monocytes Absolute: 0.2 10*3/uL (ref 0.1–1.0)
Monocytes Relative: 1 %
Neutro Abs: 14.3 10*3/uL — ABNORMAL HIGH (ref 1.7–7.7)
Neutrophils Relative %: 93 %
Platelets: 350 10*3/uL (ref 150–400)
RBC: 3.87 MIL/uL (ref 3.87–5.11)
RDW: 15.9 % — ABNORMAL HIGH (ref 11.5–15.5)
WBC: 15.3 10*3/uL — ABNORMAL HIGH (ref 4.0–10.5)
nRBC: 0 % (ref 0.0–0.2)

## 2023-12-06 LAB — BASIC METABOLIC PANEL
Anion gap: 9 (ref 5–15)
BUN: 14 mg/dL (ref 6–20)
CO2: 24 mmol/L (ref 22–32)
Calcium: 10 mg/dL (ref 8.9–10.3)
Chloride: 107 mmol/L (ref 98–111)
Creatinine, Ser: 0.98 mg/dL (ref 0.44–1.00)
GFR, Estimated: >60 mL/min (ref >60)
Glucose, Bld: 139 mg/dL — ABNORMAL HIGH (ref 70–99)
Potassium: 3.8 mmol/L (ref 3.5–5.1)
Sodium: 140 mmol/L (ref 135–145)

## 2023-12-06 MED ORDER — METRONIDAZOLE 0.75 % EX GEL
CUTANEOUS | Status: DC
  Filled 2023-12-06 (×2): qty 45

## 2023-12-06 MED ORDER — HYDROMORPHONE HCL 2 MG/ML IJ SOLN
2.0000 mg | INTRAMUSCULAR | Status: DC | PRN
  Administered 2023-12-07 – 2023-12-10 (×11): 2 mg via INTRAVENOUS
  Filled 2023-12-06 (×11): qty 1

## 2023-12-06 MED ORDER — HYDROMORPHONE HCL 1 MG/ML IJ SOLN
0.5000 mg | Freq: Once | INTRAMUSCULAR | Status: AC
  Administered 2023-12-06: 0.5 mg via INTRAVENOUS
  Filled 2023-12-06: qty 0.5

## 2023-12-06 MED ORDER — DEXAMETHASONE SODIUM PHOSPHATE 4 MG/ML IJ SOLN
4.0000 mg | INTRAMUSCULAR | Status: DC
  Administered 2023-12-07 – 2023-12-10 (×4): 4 mg via INTRAVENOUS
  Filled 2023-12-06 (×4): qty 1

## 2023-12-06 MED ORDER — DIAZEPAM 5 MG PO TABS
5.0000 mg | ORAL_TABLET | Freq: Every day | ORAL | Status: DC
  Administered 2023-12-06 – 2023-12-09 (×4): 5 mg via ORAL
  Filled 2023-12-06 (×4): qty 1

## 2023-12-06 NOTE — Progress Notes (Signed)
PROGRESS NOTE  Misty Arnold WUJ:[id number] DOB: 08-Dec-1970 DOA: 12/04/2023 PCP: Serena Croissant, MD  Brief History   The patient is a 53 yr old woman who presented to [facility] ED on 12/04/2023 with complaints of uncontrolled pain related to her metastatic right breast cancer. She has had a mastectomy and palliative radiation and chemotherapy. She is currently on Keytruda. She has a chronic fungative breast wall mass.   At home that patient had been receiving Xtampza 9 mg Q 8 hours, Oxycodone 10 mg q 3 hours, and Celebrex 200 mg daily. This has not provided her with sufficient relief lately. She has also had thoughts of suicidal ideation due to her uncontrolled pain.  The patient has been admitted with suicide precautions. Palliative care and Psychiatry were consulted.  The patient states that her pain is about a 5 now. She states that it is improved, but not quite there yet. She denies suicidal intent if her pain can be controlled. She has been evaluated by psychiatry. They feel that she is not a threat to herself or others. Suicide precautions have been discontinued.   A & P  Assessment/Plan Principal Problem:   Cancer associated pain Active Problems:   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)   Essential hypertension   Wound due to malignant neoplastic disease (HCC)   Misty Arnold is a 53 y.o. female with medical history significant for metastatic right breast cancer (s/p mastectomy, palliative radiation/chemotherapy, currently on Keytruda), chronic fungating right chest wall mass, spinal metastases (s/p T7-T10 decompressive laminectomy), chronic cancer associated pain, depression/anxiety who is admitted for pain control.   Assessment and Plan: Intractable cancer associated pain: Patient reports worsening pain control despite her home pain regimen.  Pain is related to her large right chest wall wound and back pain related to her spinal metastases. The patient states thatshe  knows that it is going to rain, because her pain is worse. -Continue OxyContin 10 mg q8h -Continue Oxy IR 10 mg q3h prn -Continue Celebrex 200 mg nightly -Tylenol as needed -IV Dilaudid prn for uncontrolled/breakthrough pain not relieved with above oral meds -IV dilaudid prn dose added to be administered prior to the patient's bamdage changes for her fungating right breast mass. -Continue bowel regimen with MiraLAX -Consult to palliative care for symptom management   Metastatic right breast cancer, widespread osseous metastasis including to spine (s/p T7-T10 decompressive laminectomy 2021): Follows with oncology Dr. Pamelia Hoit.  S/p mastectomy and palliative radiation/chemotherapy.  Current treatment is with Keytruda.  Numerous liver lesions incidentally seen on MRI L-spine concerning for metastatic disease; these have been noted on prior CT imaging.   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  She does her own dressing changes with Flagyl, continue.   Depression/anxiety/suicidal ideation: Patient admits to situational depression/suicidal ideation related to episodes of severely uncontrolled cancer associated pain. -Continue SI precautions/safety sitter -Continue Valium 5 mg q6h prn -Pain management as above -Patient is admitted with suicide precautions -Psychiatry was consulted.They do not feel that the patient is a risk to herself or others. Suicide precautions have been discontinued.  Hypertension: Continue atenolol.   DVT prophylaxis: SCDs Start: 12/04/23 2132 Code Status: Full code Family Communication: Discussed with patient, she has discussed with family Disposition Plan: From home and likely discharge to home pending clinical progress Consults called: None  I have seen and examined this patient myself. I have spent 34 minutes in her evaluation and care.  Genise Strack, DO [facility] Direct contact:  see [email or web address]  7PM-7AM contact night coverage as above 12/06/2023, 5:14 PM  LOS: 1 day   Consultants  Psychiatry Palliative care  Procedures  None  Antibiotics   Anti-infectives (From admission, onward)    None      Interval History/Subjective  The patient was resting comfortably. No new complaints. States that her pain is better, but not well controlled yet.  Objective   Vitals:  Vitals:   12/06/23 0551 12/06/23 1343  BP: 137/83 (!) 148/94  Pulse: 80 95  Resp: 18 20  Temp: 98.4 F (36.9 C) 98.5 F (36.9 C)  SpO2: 94% 94%    Exam:  Constitutional:  The patient is awake, alert, and oriented x 3. No acute distress. Respiratory:  No increased work of breathing. No wheezes, rales, or rhonchi No tactile fremitus Cardiovascular:  Regular rate and rhythm No murmurs, ectopy, or gallups. No lateral PMI. No thrills. Abdomen:  Abdomen is soft, non-tender, non-distended No hernias, masses, or organomegaly Normoactive bowel sounds.  Musculoskeletal:  No cyanosis, clubbing, or edema Skin:  No rashes, lesions, ulcers palpation of skin: no induration or nodules Neurologic:  CN 2-12 intact Sensation all 4 extremities intact Psychiatric:  Mental status Mood, affect appropriate Orientation to person, place, time  judgment and insight appear intact   I have personally reviewed the following:   Today's Data   Vitals:   12/06/23 0551 12/06/23 1343  BP: 137/83 (!) 148/94  Pulse: 80 95  Resp: 18 20  Temp: 98.4 F (36.9 C) 98.5 F (36.9 C)  SpO2: 94% 94%     Lab Data  CBC    Component Value Date/Time   WBC 15.3 (H) 12/06/2023 0930   RBC 3.87 12/06/2023 0930   HGB 10.6 (L) 12/06/2023 0930   HGB 11.2 (L) 11/14/2023 0912   HGB 13.1 11/01/2017 1417   HCT 34.3 (L) 12/06/2023 0930   HCT 40.1 11/01/2017 1417   PLT 350 12/06/2023 0930   PLT 411 (H) 11/14/2023 0912   PLT 227 11/01/2017 1417   MCV 88.6 12/06/2023 0930   MCV 89.3 11/01/2017 1417   MCH  27.4 12/06/2023 0930   MCHC 30.9 12/06/2023 0930   RDW 15.9 (H) 12/06/2023 0930   RDW 16.0 (H) 11/01/2017 1417   LYMPHSABS 0.8 12/06/2023 0930   LYMPHSABS 2.5 11/01/2017 1417   MONOABS 0.2 12/06/2023 0930   MONOABS 0.7 11/01/2017 1417   EOSABS 0.0 12/06/2023 0930   EOSABS 0.1 11/01/2017 1417   BASOSABS 0.0 12/06/2023 0930   BASOSABS 0.0 11/01/2017 1417      Latest Ref Rng & Units 12/06/2023    9:30 AM 12/05/2023    4:57 AM 12/04/2023    2:02 PM  BMP  Glucose 70 - 99 mg/dL 161  096  045   BUN 6 - 20 mg/dL 14  11  11    Creatinine 0.44 - 1.00 mg/dL 4.09  8.11  9.14   Sodium 135 - 145 mmol/L 140  138  138   Potassium 3.5 - 5.1 mmol/L 3.8  3.7  3.8   Chloride 98 - 111 mmol/L 107  105  104   CO2 22 - 32 mmol/L 24  25  24    Calcium 8.9 - 10.3 mg/dL 78.2  95.6  21.3      Micro Data   Results for orders placed or performed during the hospital encounter of 11/22/23  Culture, blood (Routine x 2)     Status: None  Collection Time: 11/22/23  9:06 PM   Specimen: BLOOD  Result Value Ref Range Status   Specimen Description   Final    BLOOD RIGHT ANTECUBITAL Performed at [facility], [address] [place], [place] [address]    Special Requests   Final    BOTTLES DRAWN AEROBIC AND ANAEROBIC Blood Culture results may not be optimal due to an inadequate volume of blood received in culture bottles Performed at [facility], [address] [place], [place] [address]    Culture   Final    NO GROWTH 5 DAYS Performed at [facility], [address] [place], [place] [address]    Report Status 11/27/2023 FINAL  Final  Culture, blood (Routine x 2)     Status: None   Collection Time: 11/22/23  9:17 PM   Specimen: BLOOD  Result Value Ref Range Status   Specimen Description   Final    BLOOD BLOOD LEFT HAND Performed at [facility], [address], [place], [place] [address]    Special Requests   Final    BOTTLES DRAWN  AEROBIC AND ANAEROBIC Blood Culture results may not be optimal due to an inadequate volume of blood received in culture bottles Performed at [facility], [address] [place], [place] [address]    Culture   Final    NO GROWTH 5 DAYS Performed at [facility], [address] [place], [place] [address]    Report Status 11/27/2023 FINAL  Final   *Note: Due to a large number of results and/or encounters for the requested time period, some results have not been displayed. A complete set of results can be found in Results Review.    Scheduled Meds:  atenolol  50 mg Oral BID   celecoxib  200 mg Oral QHS   feeding supplement  1 Container Oral TID BM   metroNIDAZOLE   Topical 1 day or 1 dose   mirtazapine  15 mg Oral QHS   oxyCODONE  10 mg Oral Q8H   polyethylene glycol  17 g Oral Daily   Continuous Infusions:  Principal Problem:   Cancer associated pain Active Problems:   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)   Cancer related pain   Essential hypertension   Wound due to malignant neoplastic disease (HCC)   Major depressive disorder, recurrent episode, moderate (HCC)   Anxiety and depression   LOS: 1 day

## 2023-12-06 NOTE — Plan of Care (Signed)

## 2023-12-06 NOTE — Consult Note (Addendum)
WOC Nurse Consult Note:  Reason for Consult: consult requested for right breast fungating tumor with full thickness wounds to that location.  Performed remotely after review of progress notes and photos in the EMR. Pt is familiar to [facility] team from recent admission on 11/30.  Large fungating tumor that covers the patient's entire left side; with depth and mod amt drainage and maceration to wound edges.  Dressing change orders for these types of wounds is directed towards minimizing pain with dressing changes and decreasing adherence to the wound which can cause bleeding. Topical treatment will not promote healing.  Pt states she is allergic to Xeroform and Vaseline dressings via Secure chat message with primary nurse, so these products should be avoided. She was also using Aquacel foam dressings, informed her we do not carry this product in the [facility] formulary and we will need to substitute regular foam dressings.   Topical treatment orders provided for bedside nurses to perform as follows:  Moisten previous dressing with NS to assist with removal, then pat dry Q day. Apply non adhesive dressings to open areas after covering with crushed Flagyl solution applied and then ABD pads and cover with foam dressings (If desired, you may use mesh underwear with the crotch cut out to use as a tank top, instead of tape, to hold the dressing in place.) Please re-consult if further assistance is needed.  Thank-you,  Cammie Mcgee MSN, RN, CWOCN, [place], CNS [phone]

## 2023-12-06 NOTE — Consult Note (Addendum)
[facility] Psychiatric Consult Initial  Patient Name: .Misty Arnold  MRN: [id number]  DOB: [birth year]  Consult Order details:  Orders (From admission, onward)     Start     Ordered   12/05/23 1355  IP CONSULT TO PSYCHIATRY       Ordering Provider: Fran Lowes, DO  Provider:  (Not yet assigned)  Question Answer Comment  Location [facility]   Reason for Consult? Suicidal ideation      12/05/23 1354             Mode of Visit: In person    Psychiatry Consult Evaluation  Service Date: December 06, 2023 LOS:  LOS: 1 day  Chief Complaint "I have been up most of the night because of my pain."  Primary Psychiatric Diagnoses  Major Depressive Disorder, recurrent, moderate   Assessment  Misty Arnold is a 53 y.o. female admitted: Medicallyfor 12/04/2023 11:32 AM for pain r/t breast cancer. She carries the psychiatric diagnoses of depression and has a past medical history of  metastatic right breast cancer.   She meets criteria for depression based on feelings of hopelessness and fatigue congruent with uncontrolled pain related to cancer diagnosis.  Currently not on any outpatient psychotropic medications and denies taking psychotropic medications in the past. On initial examination, patient was calm, cooperative and tearful throughout assessment. Please see plan below for detailed recommendations.   Diagnoses:  Active Hospital problems: Principal Problem:   Cancer associated pain Active Problems:   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)   Cancer related pain   Essential hypertension   Wound due to malignant neoplastic disease (HCC)   Major depressive disorder, recurrent episode, moderate (HCC)    Plan   ## Psychiatric Medication Recommendations:  -Recommended antidepressants and antianxiety medications, patient declined Agreeable to virtual therapy, TOC order placed  ## Medical Decision Making Capacity: Not specifically  addressed in this encounter  ## Further Work-up:  -- None -- most recent EKG on 12/05/2023 had QtC of 296/400 -- Pertinent labwork reviewed earlier this admission includes: UA, Tox, CBC w/ diff, CMP   ## Disposition:-- There are no psychiatric contraindications to discharge at this time  ## Behavioral / Environmental: - No specific recommendations at this time.     ## Safety and Observation Level:  - Based on my clinical evaluation, I estimate the patient to be at mild risk of self harm in the current setting. - At this time, we recommend  routine. This decision is based on my review of the chart including patient's history and current presentation, interview of the patient, mental status examination, and consideration of suicide risk including evaluating suicidal ideation, plan, intent, suicidal or self-harm behaviors, risk factors, and protective factors. This judgment is based on our ability to directly address suicide risk, implement suicide prevention strategies, and develop a safety plan while the patient is in the clinical setting. Please contact our team if there is a concern that risk level has changed.  CSSR Risk Category:C-SSRS RISK CATEGORY: No Risk  Suicide Risk Assessment: Patient has following modifiable risk factors for suicide: untreated depression and lack of access to outpatient mental health resources, which we are addressing by providing outpatient therapy resources and consulting with social work. Patient has following non-modifiable or demographic risk factors for suicide: none Patient has the following protective factors against suicide: Access to outpatient mental health care, Supportive family, Supportive friends, and no history of suicide attempts  Thank you for this consult  request. Recommendations have been communicated to the primary team.  We will sign off at this time.   Nanine Means, NP       History of Present Illness  Relevant Aspects of [facility] Hospital Course:  Admitted on 12/04/2023 for uncontrolled cancer related pain.   Patient Report:  Upon assessment, the client was resting in bed with the sitter at bedside. "I have been up most of the night because of my pain". Her pain at the time of assessment was a 5/10, RN just gave her Dilaudid. Client makes correlation between her pain and depression, when her pain is uncontrolled her depression is severe. When pain is uncontrolled she experiences passive suicidal ideations with no plan or intent; no history of past suicidal attempts or self injurious behaviors. She has moderate anxiety related to cancer which she has been battling for reported 14 years, denies panic attacks but has frequent crying spells in which she feels overwhelmed. When pain is controlled her sleep is good as well as her appetite. If pain is uncontrolled, she reports not sleeping and unable to eat due to her stomach feeling "queasy". Her support system includes her Mother and Step Dad who she lives with, as well as her boyfriend. "They don't understand it, my boyfriend had three siblings with cancer so he thinks he understands but doesn't;t unless you have been through it, you just don't know".  They are physically supportive but not emotionally in her opinion. In the past,  she sought out a therapist who she had close friend/ familia ties with, but stopped seeing them because her close connection made things uncomfortable. Reports attending cancer related support groups 5/6 years ago and is no longer interested.  At this time the client is not interested in starting any psychotropic medication. She would consider being connected with an outpatient therapist, resources shall be provided at time of discharge. The client's goal is "to get housing closer to [place] (where her cancer care is located) because I live 1.5 hours away."  Goal oriented with no threat to self or others.  Psych ROS:  Depression: "moderate" Anxiety:   moderate Mania (lifetime and current): none  Psychosis: (lifetime and current): none  Review of Systems  Constitutional:  Positive for malaise/fatigue.  HENT: Negative.    Eyes: Negative.   Respiratory: Negative.    Cardiovascular: Negative.   Gastrointestinal: Negative.   Genitourinary: Negative.   Musculoskeletal:  Positive for back pain.  Skin: Negative.   Neurological: Negative.   Endo/Heme/Allergies: Negative.   Psychiatric/Behavioral:  Positive for depression. The patient is nervous/anxious.      Psychiatric and Social History  Psychiatric History:  Information collected from patient and chart notes  Prev Dx/Sx: none Current Psych Provider: none Home Meds (current): none Previous Med Trials: none reported Therapy: Client saw a therapist outpatient where she had close family/friend ties with. She did not feel as if the therpist was a good fit for her.  Prior Psych Hospitalization: none  Prior Self Harm: none Prior Violence: none  Family Psych History: none Family Hx suicide: none  Social History:  Developmental Hx: Met all developmental milestones appropriately Legal Hx: none Living Situation: Mother and Step Dad Spiritual Hx: none reported Access to weapons/lethal means: none   Substance History Denies substance abuse  Exam Findings  Physical Exam: Client well groomed, lying under the covers, bonnet on head.  Vital Signs:  Temp:  [97.5 F (36.4 C)-98.4 F (36.9 C)] 98.4 F (36.9 C) (01/31 0551)  Pulse Rate:  [69-87] 80 (01/31 0551) Resp:  [16-18] 18 (01/31 0551) BP: (126-137)/(83-95) 137/83 (01/31 0551) SpO2:  [93 %-100 %] 94 % (01/31 0551) Blood pressure 137/83, pulse 80, temperature 98.4 F (36.9 C), temperature source Oral, resp. rate 18, height 5\' 5"  (1.651 m), weight 49.9 kg, SpO2 94%. Body mass index is 18.3 kg/m.  Physical Exam Vitals and nursing note reviewed.  Constitutional:      Appearance: Normal appearance.  HENT:     Head:  Normocephalic.     Nose: Nose normal.  Pulmonary:     Effort: Pulmonary effort is normal.  Musculoskeletal:     Cervical back: Normal range of motion.  Neurological:     General: No focal deficit present.     Mental Status: She is alert and oriented to person, place, and time.     Mental Status Exam: General Appearance: Well Groomed  Orientation:  Full (Time, Place, and Person)  Memory:  Immediate;   Good Recent;   Good Remote;   Good  Concentration:  Concentration: Good and Attention Span: Good  Recall:  Good  Attention  Good  Eye Contact:  Good  Speech:  Clear and Coherent and Normal Rate  Language:  Good  Volume:  Normal  Mood: depressed  Affect:  Depressed and anxious  Thought Process:  Coherent  Thought Content:  WDL  Suicidal Thoughts:  No  Homicidal Thoughts:  No  Judgement:  Good  Insight:  Good  Psychomotor Activity:  Normal  Akathisia:  NA  Fund of Knowledge:  Good      Assets:  Communication Skills Housing Resilience Social Support  Cognition:  WNL  ADL's:  Intact  AIMS (if indicated):        Other History   These have been pulled in through the EMR, reviewed, and updated if appropriate.  Family History:  The patient's family history includes Breast cancer in her maternal aunt; CAD in her paternal grandmother; Cancer in her maternal grandfather and paternal grandfather; Cancer (age of onset: 20) in her maternal aunt; Colon cancer in her maternal grandfather and paternal grandfather; Diabetes in her father, mother, and paternal grandmother; Heart attack in her maternal aunt, mother, and paternal grandmother; Heart disease in her mother; Heart failure in her paternal grandmother; Hyperlipidemia in her mother and paternal grandmother; Stroke in her father, maternal aunt, maternal grandfather, maternal grandmother, mother, and paternal grandmother.  Medical History: Past Medical History:  Diagnosis Date   Abnormal uterine bleeding    Anemia    Breast  cancer (HCC)    Depression    Fibroid    Hormone disorder    Hypertension    Infertility, female     Surgical History: Past Surgical History:  Procedure Laterality Date   ABDOMINAL HYSTERECTOMY     BREAST SURGERY     LAMINECTOMY N/A 01/04/2020   Procedure: Thoracic Eight, Thoracic Nine THORACIC LAMINECTOMY FOR TUMOR with Thoracic Seven-Thoracic Ten instrumentation;  Surgeon: Julio Sicks, MD;  Location: [facility] OR;  Service: Neurosurgery;  Laterality: N/A;  Thoracic Eight, Thoracic Nine THORACIC LAMINECTOMY FOR TUMOR with Thoracic Seven-Thoracic Ten instrumentation   MASTECTOMY     salivary gland stone       Medications:   Current Facility-Administered Medications:    acetaminophen (TYLENOL) tablet 650 mg, 650 mg, Oral, Q6H PRN **OR** acetaminophen (TYLENOL) suppository 650 mg, 650 mg, Rectal, Q6H PRN, Allena Katz, Vishal R, MD   atenolol (TENORMIN) tablet 50 mg, 50 mg, Oral, BID, Charlsie Quest, MD,  50 mg at 12/05/23 2220   bisacodyl (DULCOLAX) EC tablet 5 mg, 5 mg, Oral, Daily PRN, Charlsie Quest, MD   celecoxib (CELEBREX) capsule 200 mg, 200 mg, Oral, QHS, Patel, Vishal R, MD, 200 mg at 12/05/23 2221   diazepam (VALIUM) tablet 5 mg, 5 mg, Oral, Q6H PRN, Charlsie Quest, MD, 5 mg at 12/04/23 2250   feeding supplement (BOOST / RESOURCE BREEZE) liquid 1 Container, 1 Container, Oral, TID BM, Edsel Petrin, DO, 1 Container at 12/05/23 2000   HYDROmorphone (DILAUDID) injection 1 mg, 1 mg, Intravenous, Q3H PRN, Swayze, Ava, DO, 1 mg at 12/06/23 0311   metroNIDAZOLE (METROGEL) 0.75 % gel, , Topical, BID, Kommor, Madison, MD, Given at 12/05/23 2221   mirtazapine (REMERON) tablet 15 mg, 15 mg, Oral, QHS, Swayze, Ava, DO, 15 mg at 12/05/23 2220   ondansetron (ZOFRAN) tablet 4 mg, 4 mg, Oral, Q6H PRN **OR** ondansetron (ZOFRAN) injection 4 mg, 4 mg, Intravenous, Q6H PRN, Darreld Mclean R, MD, 4 mg at 12/05/23 1252   oxyCODONE (Oxy IR/ROXICODONE) immediate release tablet 10 mg, 10 mg, Oral, Q3H PRN,  Darreld Mclean R, MD, 10 mg at 12/06/23 0210   oxyCODONE (OXYCONTIN) 12 hr tablet 10 mg, 10 mg, Oral, Q8H, Patel, Vishal R, MD, 10 mg at 12/06/23 0556   polyethylene glycol (MIRALAX / GLYCOLAX) packet 17 g, 17 g, Oral, Daily, Darreld Mclean R, MD, 17 g at 12/05/23 1610  Allergies: Allergies  Allergen Reactions   Amoxicillin     Other reaction(s): rash/itching   Penicillins Rash    Did it involve swelling of the face/tongue/throat, SOB, or low BP? no Did it involve sudden or severe rash/hives, skin peeling, or any reaction on the inside of your mouth or nose? Yes Did you need to seek medical attention at a hospital or doctor's office? No When did it last happen? early 20's     If all above answers are "NO", may proceed with cephalosporin use.   Pork-Derived Products    Penicillin G    Tamoxifen Hypertension   Tramadol Other (See Comments)    Increases her blood pressure    Nanine Means, NP

## 2023-12-07 DIAGNOSIS — G893 Neoplasm related pain (acute) (chronic): Secondary | ICD-10-CM | POA: Diagnosis not present

## 2023-12-07 LAB — BASIC METABOLIC PANEL
Anion gap: 9 (ref 5–15)
BUN: 14 mg/dL (ref 6–20)
CO2: 28 mmol/L (ref 22–32)
Calcium: 8.8 mg/dL — ABNORMAL LOW (ref 8.9–10.3)
Chloride: 103 mmol/L (ref 98–111)
Creatinine, Ser: 1.2 mg/dL — ABNORMAL HIGH (ref 0.44–1.00)
GFR, Estimated: 54 mL/min — ABNORMAL LOW (ref >60)
Glucose, Bld: 92 mg/dL (ref 70–99)
Potassium: 4 mmol/L (ref 3.5–5.1)
Sodium: 140 mmol/L (ref 135–145)

## 2023-12-07 LAB — CBC WITH DIFFERENTIAL/PLATELET
Abs Immature Granulocytes: 0.05 10*3/uL (ref 0.00–0.07)
Basophils Absolute: 0 10*3/uL (ref 0.0–0.1)
Basophils Relative: 0 %
Eosinophils Absolute: 0 10*3/uL (ref 0.0–0.5)
Eosinophils Relative: 1 %
HCT: 39.7 % (ref 36.0–46.0)
Hemoglobin: 12.4 g/dL (ref 12.0–15.0)
Immature Granulocytes: 1 %
Lymphocytes Relative: 8 %
Lymphs Abs: 0.6 10*3/uL — ABNORMAL LOW (ref 0.7–4.0)
MCH: 27.9 pg (ref 26.0–34.0)
MCHC: 31.2 g/dL (ref 30.0–36.0)
MCV: 89.2 fL (ref 80.0–100.0)
Monocytes Absolute: 0.4 10*3/uL (ref 0.1–1.0)
Monocytes Relative: 5 %
Neutro Abs: 6.4 10*3/uL (ref 1.7–7.7)
Neutrophils Relative %: 85 %
Platelets: 403 10*3/uL — ABNORMAL HIGH (ref 150–400)
RBC: 4.45 MIL/uL (ref 3.87–5.11)
RDW: 16.3 % — ABNORMAL HIGH (ref 11.5–15.5)
WBC: 7.5 10*3/uL (ref 4.0–10.5)
nRBC: 0 % (ref 0.0–0.2)

## 2023-12-07 NOTE — Progress Notes (Signed)
PROGRESS NOTE  Misty Arnold WGN:[id number] DOB: 02/24/1971 DOA: 12/04/2023 PCP: Serena Croissant, MD  Brief History   The patient is a 53 yr old woman who presented to [facility] ED on 12/04/2023 with complaints of uncontrolled pain related to her metastatic right breast cancer. She has had a mastectomy and palliative radiation and chemotherapy. She is currently on Keytruda. She has a chronic fungative breast wall mass.   At home that patient had been receiving Xtampza 9 mg Q 8 hours, Oxycodone 10 mg q 3 hours, and Celebrex 200 mg daily. This has not provided her with sufficient relief lately. She has also had thoughts of suicidal ideation due to her uncontrolled pain.  The patient has been admitted with suicide precautions. Palliative care and Psychiatry were consulted.  The patient states that her pain is about a 5 now. She states that it is improved, but not quite there yet. She denies suicidal intent if her pain can be controlled. She has been evaluated by psychiatry. They feel that she is not a threat to herself or others. Suicide precautions have been discontinued.   The patient states that her pain is somewhat better today, but still now well enough controlled.  A & P  Assessment/Plan Principal Problem:   Cancer associated pain Active Problems:   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)   Essential hypertension   Wound due to malignant neoplastic disease (HCC)   Misty Arnold is a 53 y.o. female with medical history significant for metastatic right breast cancer (s/p mastectomy, palliative radiation/chemotherapy, currently on Keytruda), chronic fungating right chest wall mass, spinal metastases (s/p T7-T10 decompressive laminectomy), chronic cancer associated pain, depression/anxiety who is admitted for pain control.   Assessment and Plan: Intractable cancer associated pain: Patient reports worsening pain control despite her home pain regimen.  Pain is related to her  large right chest wall wound and back pain related to her spinal metastases. The patient states thatshe knows that it is going to rain, because her pain is worse. -Increase OxyContin 15 mg q12h -Continue Oxy IR 10 mg q3h prn -Continue Celebrex 200 mg nightly -Tylenol as needed -IV Dilaudid prn for uncontrolled/breakthrough pain not relieved with above oral meds -IV dilaudid prn dose added to be administered prior to the patient's bamdage changes for her fungating right breast mass. -Continue bowel regimen with MiraLAX -Consult to palliative care for symptom management   Metastatic right breast cancer, widespread osseous metastasis including to spine (s/p T7-T10 decompressive laminectomy 2021): Follows with oncology Dr. Pamelia Hoit.  S/p mastectomy and palliative radiation/chemotherapy.  Current treatment is with Keytruda.  Numerous liver lesions incidentally seen on MRI L-spine concerning for metastatic disease; these have been noted on prior CT imaging.   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  She does her own dressing changes with Flagyl, continue.   Depression/anxiety/suicidal ideation: Patient admits to situational depression/suicidal ideation related to episodes of severely uncontrolled cancer associated pain. -Continue SI precautions/safety sitter -Continue Valium 5 mg q6h prn -Pain management as above -Patient is admitted with suicide precautions -Psychiatry was consulted.They do not feel that the patient is a risk to herself or others. Suicide precautions have been discontinued.  Hypertension: Continue atenolol.   DVT prophylaxis: SCDs Start: 12/04/23 2132 Code Status: Full code Family Communication: Discussed with patient, she has discussed with family Disposition Plan: From home and likely discharge to home pending clinical progress Consults called: None  I have  seen and examined this patient myself. I  have spent 34 minutes in her evaluation and care.   Misty Hausen, DO [facility] Direct contact: see [email or web address]  7PM-7AM contact night coverage as above 12/07/2023, 6:14 PM  LOS: 2 days   Consultants  Psychiatry Palliative care  Procedures  None  Antibiotics   Anti-infectives (From admission, onward)    None      Interval History/Subjective  The patient was resting comfortably. No new complaints. States that her pain is better, but not well controlled yet.  Objective   Vitals:  Vitals:   12/07/23 0918 12/07/23 1346  BP: 114/79 125/83  Pulse: 99 87  Resp:  16  Temp:    SpO2:  98%    Exam:  Constitutional:  The patient is awake, alert, and oriented x 3. No acute distress. Respiratory:  No increased work of breathing. No wheezes, rales, or rhonchi No tactile fremitus Cardiovascular:  Regular rate and rhythm No murmurs, ectopy, or gallups. No lateral PMI. No thrills. Abdomen:  Abdomen is soft, non-tender, non-distended No hernias, masses, or organomegaly Normoactive bowel sounds.  Musculoskeletal:  No cyanosis, clubbing, or edema Skin:  No rashes, lesions, ulcers palpation of skin: no induration or nodules Neurologic:  CN 2-12 intact Sensation all 4 extremities intact Psychiatric:  Mental status Mood, affect appropriate Orientation to person, place, time  judgment and insight appear intact   I have personally reviewed the following:   Today's Data   Vitals:   12/07/23 0918 12/07/23 1346  BP: 114/79 125/83  Pulse: 99 87  Resp:  16  Temp:    SpO2:  98%     Lab Data  CBC    Component Value Date/Time   WBC 7.5 12/07/2023 0724   RBC 4.45 12/07/2023 0724   HGB 12.4 12/07/2023 0724   HGB 11.2 (L) 11/14/2023 0912   HGB 13.1 11/01/2017 1417   HCT 39.7 12/07/2023 0724   HCT 40.1 11/01/2017 1417   PLT 403 (H) 12/07/2023 0724   PLT 411 (H) 11/14/2023 0912   PLT 227 11/01/2017 1417   MCV 89.2 12/07/2023 0724   MCV 89.3 11/01/2017  1417   MCH 27.9 12/07/2023 0724   MCHC 31.2 12/07/2023 0724   RDW 16.3 (H) 12/07/2023 0724   RDW 16.0 (H) 11/01/2017 1417   LYMPHSABS 0.6 (L) 12/07/2023 0724   LYMPHSABS 2.5 11/01/2017 1417   MONOABS 0.4 12/07/2023 0724   MONOABS 0.7 11/01/2017 1417   EOSABS 0.0 12/07/2023 0724   EOSABS 0.1 11/01/2017 1417   BASOSABS 0.0 12/07/2023 0724   BASOSABS 0.0 11/01/2017 1417      Latest Ref Rng & Units 12/07/2023    7:24 AM 12/06/2023    9:30 AM 12/05/2023    4:57 AM  BMP  Glucose 70 - 99 mg/dL 92  161  096   BUN 6 - 20 mg/dL 14  14  11    Creatinine 0.44 - 1.00 mg/dL 0.45  4.09  8.11   Sodium 135 - 145 mmol/L 140  140  138   Potassium 3.5 - 5.1 mmol/L 4.0  3.8  3.7   Chloride 98 - 111 mmol/L 103  107  105   CO2 22 - 32 mmol/L 28  24  25    Calcium 8.9 - 10.3 mg/dL 8.8  91.4  78.2      Micro Data   Results for orders placed or performed during the hospital encounter of 11/22/23  Culture, blood (Routine x 2)  Status: None   Collection Time: 11/22/23  9:06 PM   Specimen: BLOOD  Result Value Ref Range Status   Specimen Description   Final    BLOOD RIGHT ANTECUBITAL Performed at [facility], [address] [place], [place] [address]    Special Requests   Final    BOTTLES DRAWN AEROBIC AND ANAEROBIC Blood Culture results may not be optimal due to an inadequate volume of blood received in culture bottles Performed at [facility], [address], [place], [place] [address]    Culture   Final    NO GROWTH 5 DAYS Performed at [facility], [address] [place], [place] [address]    Report Status 11/27/2023 FINAL  Final  Culture, blood (Routine x 2)     Status: None   Collection Time: 11/22/23  9:17 PM   Specimen: BLOOD  Result Value Ref Range Status   Specimen Description   Final    BLOOD BLOOD LEFT HAND Performed at [facility], [address] [place], [place] [address]    Special Requests   Final     BOTTLES DRAWN AEROBIC AND ANAEROBIC Blood Culture results may not be optimal due to an inadequate volume of blood received in culture bottles Performed at [facility], [address] [place], [place] [address]    Culture   Final    NO GROWTH 5 DAYS Performed at [facility], [address], [place], [place] [address]    Report Status 11/27/2023 FINAL  Final   *Note: Due to a large number of results and/or encounters for the requested time period, some results have not been displayed. A complete set of results can be found in Results Review.    Scheduled Meds:  atenolol  50 mg Oral BID   celecoxib  200 mg Oral QHS   dexamethasone (DECADRON) injection  4 mg Intravenous Q24H   diazepam  5 mg Oral q1800   feeding supplement  1 Container Oral TID BM   metroNIDAZOLE   Topical 1 day or 1 dose   mirtazapine  15 mg Oral QHS   oxyCODONE  10 mg Oral Q8H   polyethylene glycol  17 g Oral Daily   Continuous Infusions:  Principal Problem:   Cancer associated pain Active Problems:   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)   Cancer related pain   Essential hypertension   Wound due to malignant neoplastic disease (HCC)   Major depressive disorder, recurrent episode, moderate (HCC)   Anxiety and depression   LOS: 2 days

## 2023-12-07 NOTE — Progress Notes (Signed)
Palliative Care Progress Note   53 y.o. female  with past medical history of metastatic breast cancer with malignant chest wall wound admitted on 12/04/2023 with uncontrolled cancer related pain.   Her goals are for full scope medical interventions and aggressive treatment of pain and cancer related symptoms.  She reports pain worse in evening, difficulty sleeping last PM.  Recommendations:  Schedule Valium in evening for anxiety and sleep. Increased Hydromorphone to 2mg  q3 prn Decadron 4mg  IV-AM dosing to avoid side effects  No SI, sitter at bedside-coping seems to be appropriate, she is asking about her next immunotherapy tx on Monday- will need to work with cancer center to get this rescheduled-she has benefited from [facility] lodging in the past during treatment since she lives 2 hours from [facility] need to consider this at discharge again. Will continue to follow closely.  Anderson Malta, DO Palliative Medicine

## 2023-12-07 NOTE — Plan of Care (Signed)

## 2023-12-07 NOTE — Plan of Care (Signed)
  Problem: Health Behavior/Discharge Planning: Goal: Ability to manage health-related needs will improve Outcome: Progressing   Problem: Clinical Measurements: Goal: Ability to maintain clinical measurements within normal limits will improve Outcome: Progressing   Problem: Clinical Measurements: Goal: Will remain free from infection Outcome: Progressing   Problem: Clinical Measurements: Goal: Diagnostic test results will improve Outcome: Progressing   

## 2023-12-08 DIAGNOSIS — G893 Neoplasm related pain (acute) (chronic): Secondary | ICD-10-CM | POA: Diagnosis not present

## 2023-12-08 MED ORDER — OXYCODONE HCL ER 15 MG PO T12A
15.0000 mg | EXTENDED_RELEASE_TABLET | Freq: Three times a day (TID) | ORAL | Status: DC
  Administered 2023-12-08 – 2023-12-10 (×7): 15 mg via ORAL
  Filled 2023-12-08 (×7): qty 1

## 2023-12-08 NOTE — Progress Notes (Signed)
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  She had mastectomy palliative radiation and chemotherapy followed by Dr. Georgiann Mohs.  She is currently on Keytruda.  She was taking  Xtampza oxycodone and Celebrex which has not provided her any pain relief at home she even thought of suicide due to ongoing pain.  She was seen by palliative care and psych during this admission.  Psych signed off as they felt she is not a threat to herself or others.  She remains a full code.    Assessment & Plan:   Principal Problem:   Cancer associated pain Active Problems:   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)   Cancer related pain   Essential hypertension   Wound due to malignant neoplastic disease (HCC)   Major depressive disorder, recurrent episode, moderate (HCC)   Anxiety and depression  #1 cancer associated pain patient with metastatic right breast cancer with fungating lesion in the right breast history of mastectomy chemo and palliative radiation.  On Keytruda continue. Seen by palliative care appreciate their recommendations. Decadron 4 mg IV daily Dilaudid 2 mg every 3 as needed IV Celebrex 200 mg at night OxyContin 15 mg every 8 hours Oxy IR 10 mg every 3 as needed Valium 5 mg every 6 as needed and 5 mg at bedtime at night for anxiety and sleep Patient has large fungating open wound to her right chest wall she does her own dressing changes with Flagyl gel  #2 depression anxiety suicidal ideation seems to be resolving or better than prior to admission. Continue Valium  #3 hypertension continue atenolol  #4 insomnia/decreased appetite on  Remeron   Pressure Injury 01/18/20 Coccyx Mid;Anterior Stage 2 -  Partial thickness loss of dermis presenting as a shallow open injury with a red, pink wound bed without slough. 2 small open areas that have 2 small open areas within them. (Active)  01/18/20 1830  Location: Coccyx  Location Orientation: Mid;Anterior  Staging: Stage 2 -  Partial thickness loss of dermis presenting as a shallow open injury with a red, pink wound bed without slough.  Wound Description (Comments): 2 small open areas that have 2 small open areas within them.  Present on Admission:     Estimated body mass index is 18.3 kg/m as calculated from the following:   Height as of this encounter: 5\' 5"  (1.651 m).   Weight as of this encounter: 49.9 kg.  DVT prophylaxis: SCD code Status: Full code  family Communication: None at bedside  disposition Plan:  Status is: Inpatient Remains inpatient appropriate because: Acute illness   Consultants:  Psychiatry and palliative care  Procedures: None  antimicrobials: None  Subjective: Reports loose bowel movement thinks it is due to MiraLAX Feels pain is better controlled Creatinine 1.20 from 0.98 WBC 7.5 from 15.3  Objective: Vitals:   12/07/23 2145 12/08/23 0530 12/08/23 0918 12/08/23 1336  BP: 119/72 110/81 109/73 118/73  Pulse: 96 74 87 92  Resp: 20 14  16   Temp: 99.1 F (37.3 C) (!) 97.3 F (36.3 C)  97.8 F (36.6 C)  TempSrc:  Oral  Oral  SpO2: 98% 100%  97%  Weight:  Height:        Intake/Output Summary (Last 24 hours) at 12/08/2023 1416 Last data filed at 12/08/2023 1000 Gross per 24 hour  Intake 960 ml  Output --  Net 960 ml   Filed Weights   12/04/23 1130  Weight: 49.9 kg    Examination:  General exam: Appears in no acute distress Respiratory system: Clear to auscultation. Respiratory effort normal.  Right chest wall with fungating mass covered with a dressing Cardiovascular system: REGULAR  gastrointestinal system: Abdomen is  nondistended, soft and nontender. No organomegaly or masses felt. Normal bowel sounds heard. Central nervous system: Alert and oriented.  Extremities: No edema  Data Reviewed: I have personally reviewed following labs and imaging studies  CBC: Recent Labs  Lab 12/04/23 1402 12/05/23 0457 12/06/23 0930 12/07/23 0724  WBC 7.8 6.9 15.3* 7.5  NEUTROABS 5.9  --  14.3* 6.4  HGB 11.7* 10.4* 10.6* 12.4  HCT 37.3 33.7* 34.3* 39.7  MCV 89.4 89.9 88.6 89.2  PLT 449* 406* 350 403*   Basic Metabolic Panel: Recent Labs  Lab 12/04/23 1402 12/05/23 0457 12/06/23 0930 12/07/23 0724  NA 138 138 140 140  K 3.8 3.7 3.8 4.0  CL 104 105 107 103  CO2 24 25 24 28   GLUCOSE 141* 109* 139* 92  BUN 11 11 14 14   CREATININE 1.08* 1.04* 0.98 1.20*  CALCIUM 10.2 10.7* 10.0 8.8*   GFR: Estimated Creatinine Clearance: 42.2 mL/min (A) (by C-G formula based on SCr of 1.2 mg/dL (H)). Liver Function Tests: Recent Labs  Lab 12/04/23 1402 12/05/23 0457  AST 91* 76*  ALT 70* 56*  ALKPHOS 91 77  BILITOT 0.4 0.5  PROT 7.6 6.7  ALBUMIN 3.2* 2.9*   No results for input(s): "LIPASE", "AMYLASE" in the last 168 hours. No results for input(s): "AMMONIA" in the last 168 hours. Coagulation Profile: No results for input(s): "INR", "PROTIME" in the last 168 hours. Cardiac Enzymes: No results for input(s): "CKTOTAL", "CKMB", "CKMBINDEX", "TROPONINI" in the last 168 hours. BNP (last 3 results) No results for input(s): "PROBNP" in the last 8760 hours. HbA1C: No results for input(s): "HGBA1C" in the last 72 hours. CBG: No results for input(s): "GLUCAP" in the last 168 hours. Lipid Profile: No results for input(s): "CHOL", "HDL", "LDLCALC", "TRIG", "CHOLHDL", "LDLDIRECT" in the last 72 hours. Thyroid Function Tests: No results for input(s): "TSH", "T4TOTAL", "FREET4", "T3FREE", "THYROIDAB" in the last 72 hours. Anemia Panel: No results for input(s): "VITAMINB12", "FOLATE", "FERRITIN", "TIBC", "IRON",  "RETICCTPCT" in the last 72 hours. Sepsis Labs: No results for input(s): "PROCALCITON", "LATICACIDVEN" in the last 168 hours.  No results found for this or any previous visit (from the past 240 hours).    Radiology Studies: No results found.   Scheduled Meds:  atenolol  50 mg Oral BID   celecoxib  200 mg Oral QHS   dexamethasone (DECADRON) injection  4 mg Intravenous Q24H   diazepam  5 mg Oral q1800   feeding supplement  1 Container Oral TID BM   metroNIDAZOLE   Topical 1 day or 1 dose   mirtazapine  15 mg Oral QHS   oxyCODONE  15 mg Oral Q8H   polyethylene glycol  17 g Oral Daily   Continuous Infusions:   LOS: 3 days    Time spent: 38 min  Alwyn Ren, MD  12/08/2023, 2:16 PM

## 2023-12-08 NOTE — Plan of Care (Signed)

## 2023-12-09 ENCOUNTER — Inpatient Hospital Stay: Payer: Medicare PPO | Admitting: Adult Health

## 2023-12-09 ENCOUNTER — Inpatient Hospital Stay: Payer: Medicare PPO

## 2023-12-09 ENCOUNTER — Encounter: Payer: Medicare PPO | Admitting: Nutrition

## 2023-12-09 ENCOUNTER — Inpatient Hospital Stay: Payer: Medicare PPO | Admitting: Nurse Practitioner

## 2023-12-09 DIAGNOSIS — G893 Neoplasm related pain (acute) (chronic): Secondary | ICD-10-CM | POA: Diagnosis not present

## 2023-12-09 LAB — BASIC METABOLIC PANEL
Anion gap: 8 (ref 5–15)
BUN: 20 mg/dL (ref 6–20)
CO2: 22 mmol/L (ref 22–32)
Calcium: 8.1 mg/dL — ABNORMAL LOW (ref 8.9–10.3)
Chloride: 110 mmol/L (ref 98–111)
Creatinine, Ser: 0.78 mg/dL (ref 0.44–1.00)
GFR, Estimated: >60 mL/min (ref >60)
Glucose, Bld: 202 mg/dL — ABNORMAL HIGH (ref 70–99)
Potassium: 3.3 mmol/L — ABNORMAL LOW (ref 3.5–5.1)
Sodium: 140 mmol/L (ref 135–145)

## 2023-12-09 MED ORDER — POTASSIUM CHLORIDE CRYS ER 20 MEQ PO TBCR
30.0000 meq | EXTENDED_RELEASE_TABLET | Freq: Once | ORAL | Status: AC
  Administered 2023-12-09: 30 meq via ORAL
  Filled 2023-12-09: qty 1

## 2023-12-09 NOTE — Progress Notes (Signed)
PROGRESS NOTE Misty Arnold  ONG:[id number] DOB: 09-04-1971 DOA: 12/04/2023 PCP: Serena Croissant, MD  Brief Narrative/Hospital Course: 53 y.o. female with medical history significant for metastatic right breast cancer (s/p mastectomy, palliative radiation/chemotherapy, currently on Keytruda), chronic fungating right chest wall mass, spinal metastases (s/p T7-T10 decompressive laminectomy), chronic cancer associated pain, depression/anxiety who is admitted for pain control. She was taking Xtampza oxycodone and Celebrex which has not provided her any pain relief at home she even thought of suicide due to ongoing pain. She was seen by palliative care and psych during this admission. Psych signed off as they felt she is not a threat to herself or others. She remains a full code. 2/3  Overnight afebrile BP stable on room air.  Continue increased dose of OxyContin at 50 mg every 8 hours steroids along with as needed oral and IV pain management.   Subjective: Patient seen and examined She is resting reports her IV did not work and was not getting IV meds well Has been taking OxyContin and other oral meds for pain No new complaints Assessment and Plan: Principal Problem:   Cancer associated pain Active Problems:   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)   Cancer related pain   Essential hypertension   Wound due to malignant neoplastic disease (HCC)   Major depressive disorder, recurrent episode, moderate (HCC)   Anxiety and depression   Cancer associated pain patient with metastatic right breast cancer with fungating lesion in the right breast  history of mastectomy chemo and palliative radiation: On Keytruda, she is full code.  Palliative care following. Cont on aggressive pain management: Decadron iv , Dilaudid 2 mg every 3 as needed IV Celebrex 200 mg at night, OxyContin 15 mg every 8 hours ,oxy IR 10 mg every 3 as needed and Valium 5 mg every 6 as needed and 5 mg at bedtime at  night for anxiety and sleep.  Continue dressing of the fungating mass wich she does on her own  Anxiety/depression Suicidal ideation: Currently doing well resolved.  Continue Valium  Hypokalemia: Replaced  Leukocytosis: Resolved   HTN: Controlled on atenolol.     Insomnia/decreased appetite: Cont on Remeron    DVT prophylaxis: SCDs Start: 12/04/23 2132 Code Status:   Code Status: Full Code Family Communication: plan of care discussed with patient at bedside. Patient status is: Remains hospitalized because of severity of illness Level of care: Med-Surg   Dispo: The patient is from: home            Anticipated disposition: Home once pain regimen is optimized   Objective: Vitals last 24 hrs: Vitals:   12/08/23 1336 12/08/23 2131 12/09/23 0614 12/09/23 1301  BP: 118/73 121/78 115/75 117/81  Pulse: 92 (!) 103 87 81  Resp: 16 16 14 16   Temp: 97.8 F (36.6 C) 98.7 F (37.1 C) 98.2 F (36.8 C) 97.9 F (36.6 C)  TempSrc: Oral Oral Oral Oral  SpO2: 97% 95% 96% 97%  Weight:      Height:       Weight change:   Physical Examination: General exam: alert awake,at baseline, older than stated age HEENT:Oral mucosa moist, Ear/Nose WNL grossly Respiratory system: Bilaterally clear BS,no use of accessory muscle Cardiovascular system: S1 & S2 +, No JVD. Gastrointestinal system: Abdomen soft,NT,ND, BS+ Nervous System: Alert, awake, moving all extremities,and following commands. Extremities: LE edema neg,distal peripheral pulses palpable and warm.  Skin: No rashes,no icterus. MSK: Normal muscle bulk,tone, power   Medications reviewed:  Scheduled  Meds:  atenolol  50 mg Oral BID   celecoxib  200 mg Oral QHS   dexamethasone (DECADRON) injection  4 mg Intravenous Q24H   diazepam  5 mg Oral q1800   feeding supplement  1 Container Oral TID BM   metroNIDAZOLE   Topical 1 day or 1 dose   mirtazapine  15 mg Oral QHS   oxyCODONE  15 mg Oral Q8H   polyethylene glycol  17 g Oral Daily    potassium chloride  30 mEq Oral Once   Continuous Infusions:  Diet Order             Diet regular Room service appropriate? Yes; Fluid consistency: Thin  Diet effective now                  Intake/Output Summary (Last 24 hours) at 12/09/2023 1310 Last data filed at 12/08/2023 2200 Gross per 24 hour  Intake 360 ml  Output --  Net 360 ml   Net IO Since Admission: 3,011.25 mL [12/09/23 1310]  Wt Readings from Last 3 Encounters:  12/04/23 49.9 kg  11/22/23 53.1 kg  11/14/23 53.9 kg     Unresulted Labs (From admission, onward)    None     Data Reviewed: I have personally reviewed following labs and imaging studies CBC: Recent Labs  Lab 12/04/23 1402 12/05/23 0457 12/06/23 0930 12/07/23 0724  WBC 7.8 6.9 15.3* 7.5  NEUTROABS 5.9  --  14.3* 6.4  HGB 11.7* 10.4* 10.6* 12.4  HCT 37.3 33.7* 34.3* 39.7  MCV 89.4 89.9 88.6 89.2  PLT 449* 406* 350 403*   Basic Metabolic Panel:  Recent Labs  Lab 12/04/23 1402 12/05/23 0457 12/06/23 0930 12/07/23 0724 12/09/23 1030  NA 138 138 140 140 140  K 3.8 3.7 3.8 4.0 3.3*  CL 104 105 107 103 110  CO2 24 25 24 28 22   GLUCOSE 141* 109* 139* 92 202*  BUN 11 11 14 14 20   CREATININE 1.08* 1.04* 0.98 1.20* 0.78  CALCIUM 10.2 10.7* 10.0 8.8* 8.1*   GFR: Estimated Creatinine Clearance: 63.3 mL/min (by C-G formula based on SCr of 0.78 mg/dL). Liver Function Tests:  Recent Labs  Lab 12/04/23 1402 12/05/23 0457  AST 91* 76*  ALT 70* 56*  ALKPHOS 91 77  BILITOT 0.4 0.5  PROT 7.6 6.7  ALBUMIN 3.2* 2.9*  Sepsis Labs: No results for input(s): "PROCALCITON", "LATICACIDVEN" in the last 168 hours. No results found for this or any previous visit (from the past 240 hours).  Antimicrobials/Microbiology: Anti-infectives (From admission, onward)    None         Component Value Date/Time   SDES  11/22/2023 2117    BLOOD BLOOD LEFT HAND Performed at [facility], [address], [place], [place] [address]     SPECREQUEST  11/22/2023 2117    BOTTLES DRAWN AEROBIC AND ANAEROBIC Blood Culture results may not be optimal due to an inadequate volume of blood received in culture bottles Performed at [facility], [address], [place], [place] [address]    CULT  11/22/2023 2117    NO GROWTH 5 DAYS Performed at [facility], [address] [place], [place] [address]    REPTSTATUS 11/27/2023 FINAL 11/22/2023 2117     Radiology Studies: No results found.   LOS: 4 days   Total time spent in review of labs and imaging, patient evaluation, formulation of plan, documentation and communication with family: 35 minutes  Lanae Boast, MD  [facility]  12/09/2023, 1:10 PM

## 2023-12-09 NOTE — Plan of Care (Signed)

## 2023-12-09 NOTE — Plan of Care (Signed)
  Problem: Education: Goal: Knowledge of General Education information will improve Description: Including pain rating scale, medication(s)/side effects and non-pharmacologic comfort measures Outcome: Progressing   Problem: Clinical Measurements: Goal: Respiratory complications will improve Outcome: Progressing Goal: Cardiovascular complication will be avoided Outcome: Progressing   Problem: Nutrition: Goal: Adequate nutrition will be maintained Outcome: Progressing   Problem: Coping: Goal: Level of anxiety will decrease Outcome: Progressing

## 2023-12-10 ENCOUNTER — Other Ambulatory Visit (HOSPITAL_COMMUNITY): Payer: Self-pay

## 2023-12-10 ENCOUNTER — Other Ambulatory Visit: Payer: Self-pay

## 2023-12-10 ENCOUNTER — Encounter: Payer: Self-pay | Admitting: Hematology and Oncology

## 2023-12-10 DIAGNOSIS — G893 Neoplasm related pain (acute) (chronic): Secondary | ICD-10-CM | POA: Diagnosis not present

## 2023-12-10 MED ORDER — METRONIDAZOLE BENZOATE POWD
1.0000 | Freq: Every day | 3 refills | Status: DC
  Filled 2023-12-10: qty 1, fill #0

## 2023-12-10 MED ORDER — VITAMIN D (ERGOCALCIFEROL) 1.25 MG (50000 UNIT) PO CAPS
50000.0000 [IU] | ORAL_CAPSULE | ORAL | 0 refills | Status: DC
  Filled 2023-12-10: qty 12, 84d supply, fill #0

## 2023-12-10 MED ORDER — MAGNESIUM OXIDE -MG SUPPLEMENT 400 (240 MG) MG PO TABS
400.0000 mg | ORAL_TABLET | Freq: Two times a day (BID) | ORAL | 6 refills | Status: DC
  Filled 2023-12-10 (×2): qty 60, 30d supply, fill #0

## 2023-12-10 MED ORDER — MIRTAZAPINE 15 MG PO TABS
15.0000 mg | ORAL_TABLET | Freq: Every day | ORAL | 0 refills | Status: AC
  Filled 2023-12-10: qty 30, 30d supply, fill #0

## 2023-12-10 MED ORDER — DEXAMETHASONE 2 MG PO TABS
2.0000 mg | ORAL_TABLET | Freq: Every day | ORAL | 0 refills | Status: DC
  Filled 2023-12-10: qty 7, 7d supply, fill #0

## 2023-12-10 MED ORDER — CARMEX CLASSIC LIP BALM EX OINT: TOPICAL_OINTMENT | CUTANEOUS | Status: DC | PRN

## 2023-12-10 MED ORDER — XTAMPZA ER 13.5 MG PO C12A
1.0000 | EXTENDED_RELEASE_CAPSULE | Freq: Three times a day (TID) | ORAL | 0 refills | Status: DC
  Filled 2023-12-10: qty 90, 30d supply, fill #0

## 2023-12-10 NOTE — Progress Notes (Signed)
Palliative Care Progress Note  Misty Arnold is doing much better today. Pain is under better controlled on current regimen.Her mood and mental status are normal.   We discussed possible discharge plan for tomorrow -since she lives 2 hours away and needs treatment this week will see if [facility] can assist with hotel room following discharge followed by outpatient tx and follow up.   Recommendations:  DC Before 2 PM tomorrow to Extended Stay ([employer])- will get treatment on Wednesday AM in [facility] Oral Decadron 2mg  daily for 7 days Continue Celebrex daily. Xtampza 13.5mg  TID Oxycodone 10mg  q4 PRN for breakthrough pain FU in [facility] Palliative in 1- 2 weeks Continue Boost Nutritional Supplememt Use Topical Metrogel on wound bed and regular dressing changes.  Anderson Malta, DO Palliative Medicine   Time: 50 min

## 2023-12-10 NOTE — Consult Note (Signed)
 WOC consulted regarding need for debridement of her fungating breast tumor. Notified MD topical care would be focused on non traumatic dressing changes and pain relief.  Fungating breast tumors to not respond well to debridement.  Dr. Marvine saw patient yesterday as well, continue current wound care orders.   Misty Arnold [facility], [employer], [employer] [phone]

## 2023-12-10 NOTE — Progress Notes (Signed)
TOC meds delivered in a secure bag buy this RN - pt aware that Mag Oxide not available- can buy OTC.

## 2023-12-10 NOTE — Plan of Care (Signed)

## 2023-12-10 NOTE — Plan of Care (Signed)

## 2023-12-10 NOTE — Discharge Summary (Signed)
 Physician Discharge Summary  Misty Arnold FMW:[id number] DOB: [birth year] DOA: 12/04/2023  PCP: Odean Potts, MD  Admit date: 12/04/2023 Discharge date: 12/10/2023 Recommendations for Outpatient Follow-up:  Follow up with PCP in 1 weeks-call for appointment Please obtain BMP/CBC in one week  Discharge Dispo: home Discharge Condition: Stable Code Status:   Code Status: Full Code Diet recommendation:  Diet Order             Diet - low sodium heart healthy           Diet regular Room service appropriate? Yes; Fluid consistency: Thin  Diet effective now                    Brief/Interim Summary: 53 y.o. female with medical history significant for metastatic right breast cancer (s/p mastectomy, palliative radiation/chemotherapy, currently on Keytruda ), chronic fungating right chest wall mass, spinal metastases (s/p T7-T10 decompressive laminectomy), chronic cancer associated pain, depression/anxiety who is admitted for pain control. She was taking Xtampza  oxycodone  and Celebrex  which has not provided her any pain relief at home she even thought of suicide due to ongoing pain. She was seen by palliative care and psych during this admission. Psych signed off as they felt she is not a threat to herself or others. She remains a full code. 2/3  Overnight afebrile BP stable on room air.  Continue increased dose of OxyContin  at 50 mg every 8 hours steroids along with as needed oral and IV pain management. Patient will go to hotel provided by the [employer] and she will get chemotherapy tomorrow then return home  Discharge Diagnoses:  Principal Problem:   Cancer associated pain Active Problems:   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)   Cancer related pain   Essential hypertension   Wound due to malignant neoplastic disease (HCC)   Major depressive disorder, recurrent episode, moderate (HCC)   Anxiety and depression  Cancer associated pain patient  with metastatic right breast cancer with fungating lesion in the right breast  history of mastectomy chemo and palliative radiation: On Keytruda , she is full code.  Palliative care following.  Patient has manage IV steroid along with aggressive pain management at this time pain is well-controlled, appreciate palliative care she will going home on oral Decadron  opiates for pain management along with antianxiety and Remeron .   Plan is for getting immunotherapy tomorrow    Anxiety/depression Suicidal ideation: Currently doing well.  Continue medication as ordered   Hypokalemia: Replaced   Leukocytosis: Resolved   HTN: Controlled on atenolol .     Insomnia/decreased appetite: Cont on Remeron   Consults: pmt Subjective: Alert awake and resting comfortably  Discharge Exam: Vitals:   12/09/23 2021 12/10/23 0444  BP: 128/80 129/88  Pulse: (!) 105 91  Resp: 18 18  Temp: 99 F (37.2 C) 98.3 F (36.8 C)  SpO2: 98% 99%   General: Pt is alert, awake, not in acute distress Cardiovascular: RRR, S1/S2 +, no rubs, no gallops Respiratory: CTA bilaterally, no wheezing, no rhonchi Abdominal: Soft, NT, ND, bowel sounds + Extremities: no edema, no cyanosis  Discharge Instructions  Discharge Instructions     Diet - low sodium heart healthy   Complete by: As directed    Discharge instructions   Complete by: As directed    Please call call MD or return to ER for similar or worsening recurring problem that brought you to hospital or if any fever,nausea/vomiting,abdominal pain, uncontrolled pain, chest pain,  shortness of breath  or any other alarming symptoms.  Please follow-up your doctor as instructed in a week time and call the office for appointment.  Please avoid alcohol , smoking, or any other illicit substance and maintain healthy habits including taking your regular medications as prescribed.  You were cared for by a hospitalist during your hospital stay. If you have any  questions about your discharge medications or the care you received while you were in the hospital after you are discharged, you can call the unit and ask to speak with the hospitalist on call if the hospitalist that took care of you is not available.  Once you are discharged, your primary care physician will handle any further medical issues. Please note that NO REFILLS for any discharge medications will be authorized once you are discharged, as it is imperative that you return to your primary care physician (or establish a relationship with a primary care physician if you do not have one) for your aftercare needs so that they can reassess your need for medications and monitor your lab values   Discharge wound care:   Complete by: As directed    Moisten previous dressing with NS to assist with removal, then pat dry Q day. Apply non adhesive dressings to open areas after covering with crushed Flagyl  solution applied and then ABD pads and cover with foam dressings (If desired, you may use mesh underwear with the crotch cut out to use as a tank top, instead of tape, to hold the dressing in place   Increase activity slowly   Complete by: As directed       Allergies as of 12/10/2023       Reactions   Amoxicillin    Other reaction(s): rash/itching   Penicillins Rash   Did it involve swelling of the face/tongue/throat, SOB, or low BP? no Did it involve sudden or severe rash/hives, skin peeling, or any reaction on the inside of your mouth or nose? Yes Did you need to seek medical attention at a hospital or doctor's office? No When did it last happen? early 20's     If all above answers are "NO", may proceed with cephalosporin use.   Pork-derived Products    Penicillin G    Tamoxifen  Hypertension   Tramadol  Other (See Comments)   Increases her blood pressure        Medication List     STOP taking these medications    megestrol  625 MG/5ML suspension Commonly known as: MEGACE  ES        TAKE these medications    Aquacel Ag Advantage 4X5 Pads Apply 1 Pad topically in the morning and at bedtime.   atenolol  50 MG tablet Commonly known as: TENORMIN  Take 1 tablet (50 mg total) by mouth 2 (two) times daily.   celecoxib  200 MG capsule Commonly known as: CELEBREX  Take 1 capsule (200 mg total) by mouth at bedtime.   dexamethasone  2 MG tablet Commonly known as: DECADRON  Take 1 tablet (2 mg total) by mouth daily.   diazepam  5 MG tablet Commonly known as: VALIUM  Take 1 tablet (5 mg total) by mouth every 6 (six) hours as needed for muscle spasms.   metroNIDAZOLE  0.75 % gel Commonly known as: METROGEL  Apply topically 2 (two) times daily.   mirtazapine  15 MG tablet Commonly known as: REMERON  Take 1 tablet (15 mg total) by mouth at bedtime.   multivitamin with minerals tablet FLORADIX Supplement-Iron  +minerals   ondansetron  8 MG tablet Commonly known as: ZOFRAN  Take 1 tablet (  8 mg total) by mouth every 8 (eight) hours as needed for nausea or vomiting.   Oxycodone  HCl 10 MG Tabs Take 1 tablet (10 mg total) by mouth every 3 (three) hours as needed ((score 7 to 10)).   polyethylene glycol 17 g packet Commonly known as: MIRALAX  / GLYCOLAX  Take 17 g by mouth daily.   potassium chloride  SA 20 MEQ tablet Commonly known as: KLOR-CON  M Take 1 tablet by mouth daily.   prochlorperazine  10 MG tablet Commonly known as: COMPAZINE  Take 1 tablet (10 mg total) by mouth every 6 (six) hours as needed for nausea or vomiting.   Xtampza  ER 13.5 MG C12a Generic drug: oxyCODONE  ER Take 1 capsule by mouth every 8 (eight) hours. What changed:  medication strength how much to take when to take this               Discharge Care Instructions  (From admission, onward)           Start     Ordered   12/10/23 0000  Discharge wound care:       Comments: Moisten previous dressing with NS to assist with removal, then pat dry Q day. Apply non adhesive dressings to open  areas after covering with crushed Flagyl  solution applied and then ABD pads and cover with foam dressings (If desired, you may use mesh underwear with the crotch cut out to use as a tank top, instead of tape, to hold the dressing in place   12/10/23 0830            Allergies  Allergen Reactions   Amoxicillin     Other reaction(s): rash/itching   Penicillins Rash    Did it involve swelling of the face/tongue/throat, SOB, or low BP? no Did it involve sudden or severe rash/hives, skin peeling, or any reaction on the inside of your mouth or nose? Yes Did you need to seek medical attention at a hospital or doctor's office? No When did it last happen? early 20's     If all above answers are "NO", may proceed with cephalosporin use.   Pork-Derived Products    Penicillin G    Tamoxifen  Hypertension   Tramadol  Other (See Comments)    Increases her blood pressure    The results of significant diagnostics from this hospitalization (including imaging, microbiology, ancillary and laboratory) are listed below for reference.    Microbiology: No results found for this or any previous visit (from the past 240 hours).  Procedures/Studies: MR Lumbar Spine W Wo Contrast Result Date: 12/04/2023 CLINICAL DATA:  Low back pain, cauda equina syndrome suspected known metastatic disease EXAM: MRI LUMBAR SPINE WITHOUT AND WITH CONTRAST TECHNIQUE: Multiplanar and multiecho pulse sequences of the lumbar spine were obtained without and with intravenous contrast. CONTRAST:  5mL GADAVIST  GADOBUTROL  1 MMOL/ML IV SOLN COMPARISON:  MRI lumbar spine March 1, 21. CT abdomen/pelvis October 28 24. FINDINGS: Segmentation:  Standard. Alignment:  No substantial sagittal subluxation. Vertebrae: New heterogeneous T1 and T2 hyperintense lesion involving the T12 vertebral body and the posterior elements without associated enhancement. 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 Conus medullaris and cauda equina:  Conus extends to the L2 level. Conus and cauda equina appear normal. Paraspinal and other soft tissues: Partially imaged extensive lesions throughout the liver, suspicious for metastatic disease. Also known soft tissue mass involving right rib is partially imaged. Disc levels: T12-L1 through L3-L4: No significant disc protrusion, foraminal stenosis, or canal stenosis. L4-L5: Mild disc bulging  and mild facet arthropathy. No significant stenosis. L5-S1: Mild disc bulging. Mild facet arthropathy. No significant stenosis. IMPRESSION: 1. Partially imaged numerous liver lesions, concerning for metastases and new since September 02, 2023. Recommend dedicated CT or MRI of the abdomen with contrast for further evaluation. 2. T12 vertebral body lesion does not have typical characteristics of a metastasis and does not enhance; however, findings are suspicious for metastatic disease (maybe treated) given this lesion is new since prior MRI lumbar spine and given bony findings on recent CT of the chest. Atypical hemangioma is a consideration. A six-month follow-up MRI with contrast could ensure stability if clinically warranted. 3. Otherwise, somewhat diffusely heterogeneous bone marrow without definite other suspicious bone lesion. 4. No evidence of extraosseous extension of tumor or significant canal or foraminal stenosis. Electronically Signed   By: Gilmore GORMAN Molt M.D.   On: 12/04/2023 20:29   CT Angio Chest PE W and/or Wo Contrast Result Date: 11/22/2023 CLINICAL DATA:  Chest pain for 1 day, history of right breast cancer EXAM: CT ANGIOGRAPHY CHEST WITH CONTRAST TECHNIQUE: Multidetector CT imaging of the chest was performed using the standard protocol during bolus administration of intravenous contrast. Multiplanar CT image reconstructions and MIPs were obtained to evaluate the vascular anatomy. RADIATION DOSE REDUCTION: This exam was performed according to the departmental dose-optimization program which includes  automated exposure control, adjustment of the mA and/or kV according to patient size and/or use of iterative reconstruction technique. CONTRAST:  75mL OMNIPAQUE  IOHEXOL  350 MG/ML SOLN COMPARISON:  11/22/2023, 09/02/2023 FINDINGS: Cardiovascular: This is a technically adequate evaluation of the pulmonary vasculature. No filling defects or pulmonary emboli. The heart is unremarkable without pericardial effusion. No evidence of thoracic aortic aneurysm or dissection. 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 Mediastinum/Nodes: Thyroid , trachea, and esophagus are grossly unremarkable. Marked progression of the bilateral axillary adenopathy seen previously. Left axillary adenopathy measures 4.8 x 3.5 cm reference image 39/5, previously measuring 3.2 x 2.7 cm. Pre-vascular adenopathy within the anterior mediastinum now measures 1.5 by 2.1 cm reference image 62/5, previously 1.1 x 0.9 cm. Lungs/Pleura: Multilocular right pleural effusion is identified, increased since prior study. No airspace disease or pneumothorax. Chronic areas of atelectasis and scarring are seen within the right middle and right lower lobes. Central airways are patent. Upper Abdomen: Enlarging hypodense masses throughout the liver consistent with progressive metastatic disease. Lesions measure up to 6.7 cm in size. Musculoskeletal: Multiple chest wall bony and soft tissue masses have progressed in the interim since prior exam consistent with progression of disease. Largest lesion within the lower right anterior chest measures up to 8.3 x 9.3 cm, previously 8.9 x 7.0 cm. Numerous destructive lesions are seen throughout the thoracic cage, most pronounced within the right fourth, fifth, sixth, eighth, and ninth ribs. Progressive destructive lesion involving the manubrium and proximal sternum. Stable postsurgical changes of the thoracic spine. Reconstructed images demonstrate no additional findings. Review of the MIP images confirms the  above findings. IMPRESSION: 1. No evidence of pulmonary embolus. 2. Marked progression of known metastatic breast cancer, with enlarging chest wall bony and soft tissue masses, progressive hepatic metastases, worsening bony metastases, and progressive mediastinal and axillary adenopathy. 3. Multilocular right pleural effusion, increased since prior study. Electronically Signed   By: Ozell Daring M.D.   On: 11/22/2023 23:04   DG Chest Port 1 View Result Date: 11/22/2023 CLINICAL DATA:  Chest pain with nausea and vomiting. EXAM: PORTABLE CHEST 1 VIEW COMPARISON:  01/14/2020, 08/13/2023. FINDINGS: The heart size  and mediastinal contours are within normal limits. There is hazy opacification of the mid to lower right lung with a soft tissue pleural density in the mid to right lower lung field. Lytic regions are noted in the ribs on the right with suspected pathologic fracture of the T7 rib on the right. No pneumothorax is seen. The left lung is clear. Thoracic spinal fusion hardware is present. Surgical clips are noted in the right axilla. IMPRESSION: 1. Hazy opacification of the mid to right lower lung with pleural thickening, compatible with known metastatic disease. 2. Small right pleural effusion. 3. Multiple lytic and expansile lesions in the ribs on the left with suspected pathologic fracture of the T7 rib on the right. Electronically Signed   By: Leita Birmingham M.D.   On: 11/22/2023 21:24    Labs: BNP (last 3 results) No results for input(s): BNP in the last 8760 hours. Basic Metabolic Panel: Recent Labs  Lab 12/04/23 1402 12/05/23 0457 12/06/23 0930 12/07/23 0724 12/09/23 1030  NA 138 138 140 140 140  K 3.8 3.7 3.8 4.0 3.3*  CL 104 105 107 103 110  CO2 24 25 24 28 22   GLUCOSE 141* 109* 139* 92 202*  BUN 11 11 14 14 20   CREATININE 1.08* 1.04* 0.98 1.20* 0.78  CALCIUM  10.2 10.7* 10.0 8.8* 8.1*   Liver Function Tests: Recent Labs  Lab 12/04/23 1402 12/05/23 0457  AST 91* 76*  ALT  70* 56*  ALKPHOS 91 77  BILITOT 0.4 0.5  PROT 7.6 6.7  ALBUMIN  3.2* 2.9*   No results for input(s): LIPASE, AMYLASE in the last 168 hours. No results for input(s): AMMONIA in the last 168 hours. CBC: Recent Labs  Lab 12/04/23 1402 12/05/23 0457 12/06/23 0930 12/07/23 0724  WBC 7.8 6.9 15.3* 7.5  NEUTROABS 5.9  --  14.3* 6.4  HGB 11.7* 10.4* 10.6* 12.4  HCT 37.3 33.7* 34.3* 39.7  MCV 89.4 89.9 88.6 89.2  PLT 449* 406* 350 403*  Anemia work up No results for input(s): VITAMINB12, FOLATE, FERRITIN, TIBC, IRON , RETICCTPCT in the last 72 hours. Urinalysis    Component Value Date/Time   COLORURINE YELLOW 12/04/2023 1402   APPEARANCEUR CLEAR 12/04/2023 1402   LABSPEC 1.017 12/04/2023 1402   PHURINE 7.0 12/04/2023 1402   GLUCOSEU NEGATIVE 12/04/2023 1402   HGBUR NEGATIVE 12/04/2023 1402   BILIRUBINUR NEGATIVE 12/04/2023 1402   KETONESUR NEGATIVE 12/04/2023 1402   PROTEINUR 30 (A) 12/04/2023 1402   NITRITE NEGATIVE 12/04/2023 1402   LEUKOCYTESUR NEGATIVE 12/04/2023 1402   Sepsis Labs Recent Labs  Lab 12/04/23 1402 12/05/23 0457 12/06/23 0930 12/07/23 0724  WBC 7.8 6.9 15.3* 7.5   Microbiology No results found for this or any previous visit (from the past 240 hours).   Time coordinating discharge: 25  minutes  SIGNED: Mennie LAMY, MD  [facility] 12/10/2023, 8:31 AM  If 7PM-7AM, please contact night-coverage [email or web address]

## 2023-12-11 ENCOUNTER — Other Ambulatory Visit (HOSPITAL_COMMUNITY): Payer: Self-pay

## 2023-12-11 ENCOUNTER — Other Ambulatory Visit: Payer: Self-pay | Admitting: Hematology and Oncology

## 2023-12-11 ENCOUNTER — Inpatient Hospital Stay: Payer: Medicare PPO | Attending: Hematology and Oncology | Admitting: Licensed Clinical Social Worker

## 2023-12-11 ENCOUNTER — Encounter: Payer: Self-pay | Admitting: *Deleted

## 2023-12-11 DIAGNOSIS — Z5112 Encounter for antineoplastic immunotherapy: Secondary | ICD-10-CM | POA: Insufficient documentation

## 2023-12-11 DIAGNOSIS — Z79899 Other long term (current) drug therapy: Secondary | ICD-10-CM | POA: Insufficient documentation

## 2023-12-11 DIAGNOSIS — C7951 Secondary malignant neoplasm of bone: Secondary | ICD-10-CM | POA: Insufficient documentation

## 2023-12-11 DIAGNOSIS — C50411 Malignant neoplasm of upper-outer quadrant of right female breast: Secondary | ICD-10-CM | POA: Insufficient documentation

## 2023-12-11 DIAGNOSIS — Z9011 Acquired absence of right breast and nipple: Secondary | ICD-10-CM | POA: Insufficient documentation

## 2023-12-11 DIAGNOSIS — Z170 Estrogen receptor positive status [ER+]: Secondary | ICD-10-CM | POA: Insufficient documentation

## 2023-12-11 MED ORDER — DIAZEPAM 5 MG PO TABS
5.0000 mg | ORAL_TABLET | Freq: Four times a day (QID) | ORAL | 3 refills | Status: DC | PRN
  Filled 2023-12-11: qty 60, 15d supply, fill #0

## 2023-12-11 NOTE — Progress Notes (Signed)
 [facility] CSW Progress Note  Patient walked in to support services to discuss housing. Pt wants to move closer to [facility] to have easier access to cancer center. CSW has previously discussed agencies that maintain housing lists with patient. Discussed again today and looked at them together. CSW made referrals to [employer] and [employer] program. Pt is looking for an apartment that is in a good area, is handicap accessible, 2 bedrooms and has utilities included. Other than those agencies, pt may need to work with a realtor to find places that meet her needs.   Pt is staying at [facility] tonight through the [employer] lodging program.    Misty Kohlenberg E Tacari Repass, LCSW Clinical Social Worker [facility]

## 2023-12-11 NOTE — Progress Notes (Signed)
 Received call from pt stating she was discharged from hospital yesterday and was not given her home medications that were locked in the pharmacy.  RN obtained medications and returned to patient.  Medications verified with Berwyn Overly, RN: Celecoxib  200 mg qty: 24 capsules, Oxycodone  10 mg qty: 101 tablets, xtampza  9 mg qty 23 capsules, compazine  10 mg qty 30 tablets. Returned to pt.

## 2023-12-12 ENCOUNTER — Inpatient Hospital Stay: Payer: Medicare PPO

## 2023-12-12 ENCOUNTER — Inpatient Hospital Stay: Payer: Medicare PPO | Admitting: Hematology and Oncology

## 2023-12-12 ENCOUNTER — Other Ambulatory Visit (HOSPITAL_COMMUNITY): Payer: Self-pay

## 2023-12-12 ENCOUNTER — Encounter: Payer: Self-pay | Admitting: Hematology and Oncology

## 2023-12-12 VITALS — BP 141/95 | HR 115 | Resp 16

## 2023-12-12 VITALS — BP 150/95 | HR 109 | Temp 97.9°F | Resp 18 | Ht 65.0 in | Wt 126.5 lb

## 2023-12-12 DIAGNOSIS — C50411 Malignant neoplasm of upper-outer quadrant of right female breast: Secondary | ICD-10-CM | POA: Diagnosis not present

## 2023-12-12 DIAGNOSIS — Z170 Estrogen receptor positive status [ER+]: Secondary | ICD-10-CM

## 2023-12-12 DIAGNOSIS — Z5112 Encounter for antineoplastic immunotherapy: Secondary | ICD-10-CM | POA: Diagnosis not present

## 2023-12-12 DIAGNOSIS — Z79899 Other long term (current) drug therapy: Secondary | ICD-10-CM | POA: Diagnosis not present

## 2023-12-12 DIAGNOSIS — Z9011 Acquired absence of right breast and nipple: Secondary | ICD-10-CM | POA: Diagnosis not present

## 2023-12-12 DIAGNOSIS — C50919 Malignant neoplasm of unspecified site of unspecified female breast: Secondary | ICD-10-CM

## 2023-12-12 DIAGNOSIS — C7949 Secondary malignant neoplasm of other parts of nervous system: Secondary | ICD-10-CM

## 2023-12-12 DIAGNOSIS — C7951 Secondary malignant neoplasm of bone: Secondary | ICD-10-CM | POA: Diagnosis not present

## 2023-12-12 LAB — CBC WITH DIFFERENTIAL (CANCER CENTER ONLY)
Abs Immature Granulocytes: 0.17 10*3/uL — ABNORMAL HIGH (ref 0.00–0.07)
Basophils Absolute: 0 10*3/uL (ref 0.0–0.1)
Basophils Relative: 0 %
Eosinophils Absolute: 0.1 10*3/uL (ref 0.0–0.5)
Eosinophils Relative: 1 %
HCT: 33.3 % — ABNORMAL LOW (ref 36.0–46.0)
Hemoglobin: 10.5 g/dL — ABNORMAL LOW (ref 12.0–15.0)
Immature Granulocytes: 2 %
Lymphocytes Relative: 14 %
Lymphs Abs: 1.2 10*3/uL (ref 0.7–4.0)
MCH: 27.5 pg (ref 26.0–34.0)
MCHC: 31.5 g/dL (ref 30.0–36.0)
MCV: 87.2 fL (ref 80.0–100.0)
Monocytes Absolute: 0.8 10*3/uL (ref 0.1–1.0)
Monocytes Relative: 9 %
Neutro Abs: 6.4 10*3/uL (ref 1.7–7.7)
Neutrophils Relative %: 74 %
Platelet Count: 404 10*3/uL — ABNORMAL HIGH (ref 150–400)
RBC: 3.82 MIL/uL — ABNORMAL LOW (ref 3.87–5.11)
RDW: 16.9 % — ABNORMAL HIGH (ref 11.5–15.5)
WBC Count: 8.6 10*3/uL (ref 4.0–10.5)
nRBC: 0.7 % — ABNORMAL HIGH (ref 0.0–0.2)

## 2023-12-12 LAB — CMP (CANCER CENTER ONLY)
ALT: 40 U/L (ref 0–44)
AST: 60 U/L — ABNORMAL HIGH (ref 15–41)
Albumin: 3.3 g/dL — ABNORMAL LOW (ref 3.5–5.0)
Alkaline Phosphatase: 119 U/L (ref 38–126)
Anion gap: 7 (ref 5–15)
BUN: 12 mg/dL (ref 6–20)
CO2: 27 mmol/L (ref 22–32)
Calcium: 8.4 mg/dL — ABNORMAL LOW (ref 8.9–10.3)
Chloride: 108 mmol/L (ref 98–111)
Creatinine: 0.77 mg/dL (ref 0.44–1.00)
GFR, Estimated: >60 mL/min (ref >60)
Glucose, Bld: 82 mg/dL (ref 70–99)
Potassium: 3.7 mmol/L (ref 3.5–5.1)
Sodium: 142 mmol/L (ref 135–145)
Total Bilirubin: 0.2 mg/dL (ref 0.0–1.2)
Total Protein: 7 g/dL (ref 6.5–8.1)

## 2023-12-12 MED ORDER — SODIUM CHLORIDE 0.9 % IV SOLN
INTRAVENOUS | Status: DC
Start: 2023-12-12 — End: 2023-12-12

## 2023-12-12 MED ORDER — SODIUM CHLORIDE 0.9 % IV SOLN
200.0000 mg | Freq: Once | INTRAVENOUS | Status: AC
  Administered 2023-12-12: 200 mg via INTRAVENOUS
  Filled 2023-12-12: qty 200

## 2023-12-12 MED ORDER — DEXAMETHASONE 1 MG PO TABS
2.0000 mg | ORAL_TABLET | Freq: Every day | ORAL | 3 refills | Status: DC
  Filled 2023-12-12: qty 30, 15d supply, fill #0

## 2023-12-12 MED ORDER — POTASSIUM CHLORIDE CRYS ER 20 MEQ PO TBCR
20.0000 meq | EXTENDED_RELEASE_TABLET | Freq: Every day | ORAL | 6 refills | Status: DC
  Filled 2023-12-12: qty 30, 30d supply, fill #0

## 2023-12-12 NOTE — Progress Notes (Signed)
 Patient Care Team: Odean Potts, MD as PCP - General (Hematology and Oncology)  DIAGNOSIS:  Encounter Diagnosis  Name Primary?   Malignant neoplasm of upper-outer quadrant of right breast in female, estrogen receptor positive (HCC) Yes    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; [facility] and delayed therapy   01/26/2011 Surgery   right mastectomy followed by reconstruction: Invasive ductal carcinoma T1 N1 MIC M0 stage IB ER/PR positive HER-2 negative, BRCA negative, Oncotype DX low risk   05/29/2012 Procedure   right chest wall nodule excision done on 06/24/2012 showed metastatic carcinoma margins were positive, but CT scan no metastatic disease, recommended chemotherapy but patient refused also refused reexcision   04/28/2013 Treatment Plan Change   patient went to [place] for alternative treatments and took herbal medications, tonics etc. But she could not afford these trips.   01/08/2014 Breast MRI   right breast multiple enhancing masses within the soft tissues largest 5.6 cm in wall skin surface and the capsule of the silicone prosthesis, contiguous nodules involving in inferomedial breast and tired and subcentimeter nodules across the midline   11/02/2014 Cancer Staging   Staging form: Breast, AJCC 7th Edition - Clinical: Stage IV (T4b, N0, M1) - Signed by Odean Potts, MD on 12/18/2022   08/29/2015 Surgery   Right mastectomy: IDC with invol of skin and skin ulceration, breast capsule inv cancer, superior medial margin positive, 1/1 LN positive, grade 3, 14.3 cm, 3.5 cm, ALI, chest wall involv, ER  90-100%, PR 80-90%, HER-2 negative, Ki 67 40-50% T4CN1 (St 3B)   03/05/2016 -  Anti-estrogen oral therapy   Tamoxifen  20 mg daily stopped due to headache and uncontrolled hypertension. Decrease to 10 mg daily 04/03/2016, stopped June 2017 and took an estrogen metabolizer over-the-counter; started Aromasin  April 2018 from a [place] physician, Zoladex  started on 05/2017 and switched to Letrozole  in 09/2017   01/04/2020 Surgery   Patient presented to the ED on 01/04/20 for worsening lower extremity numbness and underwent a decompressive laminectomy with tumor resection at T9 that was complicated with acute blood loss anemia and leukocytosis.   01/28/2020 - 02/15/2020 Radiation Therapy   Palliative radiation at site of thoracic tumor resection   03/03/2020 Miscellaneous    Ibrance  with letrozole  and Zoladex     Miscellaneous   Guardant 360: ESR 1 mutations, PI K3 CA mutation, EGFR mutation, T p53 mutation   10/17/2021 - 06/06/2022 Chemotherapy   Patient is on Treatment Plan : BREAST METASTATIC fam-trastuzumab deruxtecan-nxki  (Enhertu ) q21d     10/17/2021 - 03/12/2023 Chemotherapy   Patient is on Treatment Plan : BREAST METASTATIC Fam-Trastuzumab Deruxtecan-nxki  (Enhertu ) (5.4) q21d     11/14/2023 -  Chemotherapy   Patient is on Treatment Plan : BREAST Pembrolizumab  (200) q21d x 24 months     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)  01/04/2020 Initial Diagnosis   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)   10/17/2021 - 06/06/2022 Chemotherapy   Patient is on Treatment Plan : BREAST METASTATIC fam-trastuzumab deruxtecan-nxki  (Enhertu ) q21d     10/17/2021 - 03/12/2023 Chemotherapy   Patient is on Treatment Plan : BREAST METASTATIC Fam-Trastuzumab Deruxtecan-nxki  (Enhertu ) (  5.4) q21d     11/14/2023 -  Chemotherapy   Patient is on Treatment Plan : BREAST Pembrolizumab  (200) q21d x 24 months     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)  01/08/2020 Initial Diagnosis   Metastatic breast  cancer (HCC)   10/17/2021 - 06/06/2022 Chemotherapy   Patient is on Treatment Plan : BREAST METASTATIC fam-trastuzumab deruxtecan-nxki  (Enhertu ) q21d     10/17/2021 - 03/12/2023 Chemotherapy   Patient is on Treatment Plan : BREAST METASTATIC Fam-Trastuzumab Deruxtecan-nxki  (Enhertu ) (5.4) q21d     11/14/2023 -  Chemotherapy   Patient is on Treatment Plan : BREAST Pembrolizumab  (200) q21d x 24 months       CHIEF COMPLIANT: Follow-up on Keytruda   HISTORY OF PRESENT ILLNESS:   History of Present Illness   Misty Arnold is a 53 year old female who presents for follow-up after recent hospital discharge.  She is currently undergoing treatment with Keytruda  and has not experienced significant issues with nausea. There was one instance of dizziness after taking Decadron  in the hospital, but she has not had dizziness or lightheadedness recently, despite having low blood pressure.  She experiences significant pain and manages it by taking her medication before the pain intensifies. She slept well last night after setting an alarm to take her medication preemptively. After her recent hospital discharge on Tuesday, she initially did not receive her medications, leading to discomfort until she retrieved them on Wednesday.  Her current medication regimen includes potassium supplements due to previously low levels, with recent blood work showing improvement to a potassium level of 3.7. She is also taking a sleeping or anxiety pill, ibuprofen, and dexamethasone  for appetite stimulation. She was given seven tablets of dexamethasone , which she has been taking since her hospital stay.  Recent lab results indicate stable hemoglobin levels at 10.5, improved liver enzymes with ALT decreasing from 70 to 40, and an increase in albumin , reflecting better nutrition.  She has gained weight, now weighing 126 pounds, up from a previous weight of 109-110 pounds.         ALLERGIES:  is allergic to amoxicillin,  penicillins, pork-derived products, penicillin g, tamoxifen , and tramadol .  MEDICATIONS:  Current Outpatient Medications  Medication Sig Dispense Refill   atenolol  (TENORMIN ) 50 MG tablet Take 1 tablet (50 mg total) by mouth 2 (two) times daily. 180 tablet 3   celecoxib  (CELEBREX ) 200 MG capsule Take 1 capsule (200 mg total) by mouth at bedtime. 30 capsule 6   dexamethasone  (DECADRON ) 1 MG tablet Take 2 tablets (2 mg total) by mouth daily. 30 tablet 3   diazepam  (VALIUM ) 5 MG tablet Take 1 tablet (5 mg total) by mouth every 6 (six) hours as needed for muscle spasms. 60 tablet 3   magnesium  oxide (MAG-OX) 400 (240 Mg) MG tablet Take 1 tablet (400 mg total) by mouth 2 (two) times daily. 60 tablet 6   metroNIDAZOLE  (METROGEL ) 0.75 % gel Apply topically 2 (two) times daily. 135 g 6   Metronidazole  Benzoate POWD 1 Dose by Does not apply route daily. 1 g 3   mirtazapine  (REMERON ) 15 MG tablet Take 1 tablet (15 mg total) by mouth at bedtime. 30 tablet 0   Multiple Vitamins-Minerals (MULTIVITAMIN WITH MINERALS) tablet FLORADIX Supplement-Iron  +minerals     ondansetron  (ZOFRAN ) 8 MG tablet Take 1 tablet (8 mg total) by mouth every 8 (eight) hours as needed for nausea or vomiting. 30 tablet 1   oxyCODONE  ER (XTAMPZA  ER) 13.5 MG C12A Take  1 capsule by mouth every 8 (eight) hours. 90 capsule 0   Oxycodone  HCl 10 MG TABS Take 1 tablet (10 mg total) by mouth every 3 (three) hours as needed ((score 7 to 10)). 180 tablet 0   polyethylene glycol (MIRALAX  / GLYCOLAX ) 17 g packet Take 17 g by mouth daily. 30 each 0   potassium chloride  SA (KLOR-CON  M) 20 MEQ tablet Take 1 tablet by mouth daily. 30 tablet 6   prochlorperazine  (COMPAZINE ) 10 MG tablet Take 1 tablet (10 mg total) by mouth every 6 (six) hours as needed for nausea or vomiting. 30 tablet 1   Silver -Carboxymethylcellulose (AQUACEL AG ADVANTAGE) 4X5 PADS Apply 1 Pad topically in the morning and at bedtime. 60 each 3   Vitamin D , Ergocalciferol ,  (DRISDOL ) 1.25 MG (50000 UNIT) CAPS capsule Take 1 capsule (50,000 Units total) by mouth every 7 (seven) days. 12 capsule 0   No current facility-administered medications for this visit.   Facility-Administered Medications Ordered in Other Visits  Medication Dose Route Frequency Provider Last Rate Last Admin   0.9 %  sodium chloride  infusion   Intravenous Continuous Odean Potts, MD   Stopped at 12/12/23 1613    PHYSICAL EXAMINATION: ECOG PERFORMANCE STATUS: 1 - Symptomatic but completely ambulatory  Vitals:   12/12/23 1345  BP: (!) 150/95  Pulse: (!) 109  Resp: 18  Temp: 97.9 F (36.6 C)  SpO2: 99%   Filed Weights   12/12/23 1345  Weight: 126 lb 8 oz (57.4 kg)    Physical Exam   MEASUREMENTS: WT- 126      (exam performed in the presence of a chaperone)  LABORATORY DATA:  I have reviewed the data as listed    Latest Ref Rng & Units 12/12/2023   11:48 AM 12/09/2023   10:30 AM 12/07/2023    7:24 AM  CMP  Glucose 70 - 99 mg/dL 82  797  92   BUN 6 - 20 mg/dL 12  20  14    Creatinine 0.44 - 1.00 mg/dL 9.22  9.21  8.79   Sodium 135 - 145 mmol/L 142  140  140   Potassium 3.5 - 5.1 mmol/L 3.7  3.3  4.0   Chloride 98 - 111 mmol/L 108  110  103   CO2 22 - 32 mmol/L 27  22  28    Calcium  8.9 - 10.3 mg/dL 8.4  8.1  8.8   Total Protein 6.5 - 8.1 g/dL 7.0     Total Bilirubin 0.0 - 1.2 mg/dL 0.2     Alkaline Phos 38 - 126 U/L 119     AST 15 - 41 U/L 60     ALT 0 - 44 U/L 40       Lab Results  Component Value Date   WBC 8.6 12/12/2023   HGB 10.5 (L) 12/12/2023   HCT 33.3 (L) 12/12/2023   MCV 87.2 12/12/2023   PLT 404 (H) 12/12/2023   NEUTROABS 6.4 12/12/2023    ASSESSMENT & PLAN:  Breast cancer of upper-outer quadrant of right female breast (HCC) Recurrent right breast cancer initially DCIS treated with mastectomy followed by reconstruction and later developed chest wall recurrence in 2013 ER/PR positive HER-2 negative, patient underwent alternative therapies in [place] but  could not afford the trips and hence she has not been on any breast cancer therapy for a long time.   Treatment summary: 1. Right mastectomy 08/29/2015 at [facility] (Dr.Singh): IDC with invol of skin and skin ulceration, breast capsule inv  cancer, superior medial margin positive, 1/1 LN positive, grade 3, 14.3 cm, 3.5 cm, ALI, chest wall involv, ER 90-100%, PR 80-90%, HER-2 negative, Ki 67 40-50% T4CN1 (St 3B) 2. Patient started tamoxifen  but developed severe headache with severe hypertension and tamoxifen  was discontinued 03/17/2016.  3. 02/2017 started on Exemestane , Zoladex  started 05/29/2017 4.  Hospitalization for paraplegia: T9 vertebral body compression status post decompressive laminectomy and tumor resection 01/04/2020 5.  Palliative radiation to the thoracic spine 01/28/2020 6.  Ibrance  with letrozole  started April 2021 discontinued 07/24/2021 7.  Enhertu  started 10/17/2021 every 3 weeks discontinued May 2024 due to progression 8.  Elacestrant  04/10/2023-09/19/2023 ----------------------------------------------------------------------------------------------------------------- CT chest 03/10/2023: Progressive osseous and chest wall metastasis (sternal mass 6.7 cm previously 6.2, manubrium 2.4 cm previously 1.5 cm, right posterior ninth rib 4.2 cm was previously 3.6 cm, right chest wall mass 6.4 cm was previously 6.1 cm, adjacent 4.6 cm chest wall mass previously 4.1 cm   Treatment plan:  Toxicities: Fatigue Vertigo: Brain MRI negative Wound care issues: Patient has home health.  I sent prescription for her supplies.   CT CAP 09/13/2023: Significant progression of disease.  Increase in the chest wall masses in size and number (6.7 cm 4.7 cm, 8.9 cm was 2.5 x 4.2 cm , 7.6 cm used to be 4.8 cm, 6.3 cm was 4.5 cm), increasing right pleural effusion increasing prevascular lymph node and bilateral axillary lymph nodes, right supraclavicular and right hemipelvis.  Increasing edema in the posterior  mediastinum increased size of liver metastases) 3.9 cm used to be 2.1 cm)   Guardant360 09/29/2023: ESR 1 mutation, T p53, FGF R1 amplification, GA TA 3, ATRX splice site SNV were positive.  TMB: 11.39, MSI high not detected   Treatment plan: Immunotherapy with Keytruda  (TMB high) started 11/14/2023, today cycle 2 Hospitalization 12/04/2023-12/10/2023: Pain control, depression/anxiety: Palliative care following Return to clinic every 3 weeks for Keytruda  ------------------------------------- Assessment and Plan    Cancer (undergoing treatment with Keytruda ) Undergoing Keytruda  treatment with no significant side effects. Experienced dizziness once after Decadron  but otherwise tolerated well. Labs show improved liver enzymes (ALT decreased from 70 to 40), stable hemoglobin at 10.5, and normal platelets. Discussed and agreed to continue Keytruda . - Continue Keytruda  treatment - Schedule MRI - Follow-up with surgeon  Appetite Stimulation (due to cancer treatment) Taking dexamethasone  to improve appetite. Initially given a 7-day supply, now prescribed a 30-day supply at a lower dosage to maintain appetite improvement. - Prescribe 30 tablets of dexamethasone  at a lower dosage  Anemia Mild anemia with hemoglobin levels around 9.1-9.4, slightly improved. No immediate intervention required. - Monitor hemoglobin levels  Hypokalemia Potassium levels improved to 3.7 with current supplementation. Discussed importance of maintaining potassium levels and agreed to continue supplementation. - Send potassium prescription to [facility] - Instruct patient to ask pharmacy to ship potassium to her home  General Health Maintenance Gained weight to 126 lbs from 109-110 lbs. Improved nutrition indicated by increased albumin  levels. - Monitor weight and nutritional status  Follow-up - Schedule follow-up appointment in three weeks - Review MRI and surgical consultation results at next visit.          No  orders of the defined types were placed in this encounter.  The patient has a good understanding of the overall plan. she agrees with it. she will call with any problems that may develop before the next visit here. Total time spent: 30 mins including face to face time and time spent for planning, charting and co-ordination  of care   Misty MARLA Chad, MD 12/12/23

## 2023-12-12 NOTE — Patient Instructions (Signed)

## 2023-12-12 NOTE — Assessment & Plan Note (Signed)
 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   Treatment summary: 1. Right mastectomy 08/29/2015 at [facility] (Dr.Singh): IDC with invol of skin and skin ulceration, breast capsule inv cancer, superior medial margin positive, 1/1 LN positive, grade 3, 14.3 cm, 3.5 cm, ALI, chest wall involv, ER 90-100%, PR 80-90%, HER-2 negative, Ki 67 40-50% T4CN1 (St 3B) 2. Patient started tamoxifen  but developed severe headache with severe hypertension and tamoxifen  was discontinued 03/17/2016.  3. 02/2017 started on Exemestane , Zoladex  started 05/29/2017 4.  Hospitalization for paraplegia: T9 vertebral body compression status post decompressive laminectomy and tumor resection 01/04/2020 5.  Palliative radiation to the thoracic spine 01/28/2020 6.  Ibrance  with letrozole  started April 2021 discontinued 07/24/2021 7.  Enhertu  started 10/17/2021 every 3 weeks discontinued May 2024 due to progression 8.  Elacestrant  04/10/2023-09/19/2023 ----------------------------------------------------------------------------------------------------------------- CT chest 03/10/2023: Progressive osseous and chest wall metastasis (sternal mass 6.7 cm previously 6.2, manubrium 2.4 cm previously 1.5 cm, right posterior ninth rib 4.2 cm was previously 3.6 cm, right chest wall mass 6.4 cm was previously 6.1 cm, adjacent 4.6 cm chest wall mass previously 4.1 cm   Treatment plan:  Toxicities: Fatigue Vertigo: Brain MRI negative Wound care issues: Patient has home health.  I sent prescription for her supplies.   CT CAP 09/13/2023: Significant progression of disease.  Increase in the chest wall masses in size and number (6.7 cm 4.7 cm, 8.9 cm was 2.5 x 4.2 cm , 7.6 cm  used to be 4.8 cm, 6.3 cm was 4.5 cm), increasing right pleural effusion increasing prevascular lymph node and bilateral axillary lymph nodes, right supraclavicular and right hemipelvis.  Increasing edema in the posterior mediastinum increased size of liver metastases) 3.9 cm used to be 2.1 cm)   Guardant360 09/29/2023: ESR 1 mutation, T p53, FGF R1 amplification, GA TA 3, ATRX splice site SNV were positive.  TMB: 11.39, MSI high not detected   Treatment plan: Immunotherapy with Keytruda  (TMB high) started 11/14/2023, today cycle 2 Hospitalization 12/04/2023-12/10/2023: Pain control, depression/anxiety: Palliative care following Return to clinic every 3 weeks for Keytruda 

## 2023-12-13 DIAGNOSIS — R530 Neoplastic (malignant) related fatigue: Secondary | ICD-10-CM | POA: Diagnosis not present

## 2023-12-13 DIAGNOSIS — F419 Anxiety disorder, unspecified: Secondary | ICD-10-CM | POA: Diagnosis not present

## 2023-12-13 DIAGNOSIS — R45851 Suicidal ideations: Secondary | ICD-10-CM | POA: Diagnosis not present

## 2023-12-13 DIAGNOSIS — G893 Neoplasm related pain (acute) (chronic): Secondary | ICD-10-CM | POA: Diagnosis not present

## 2023-12-13 DIAGNOSIS — Z4800 Encounter for change or removal of nonsurgical wound dressing: Secondary | ICD-10-CM | POA: Diagnosis not present

## 2023-12-13 DIAGNOSIS — F331 Major depressive disorder, recurrent, moderate: Secondary | ICD-10-CM | POA: Diagnosis not present

## 2023-12-13 DIAGNOSIS — Z170 Estrogen receptor positive status [ER+]: Secondary | ICD-10-CM | POA: Diagnosis not present

## 2023-12-13 DIAGNOSIS — C50411 Malignant neoplasm of upper-outer quadrant of right female breast: Secondary | ICD-10-CM | POA: Diagnosis not present

## 2023-12-13 DIAGNOSIS — C7951 Secondary malignant neoplasm of bone: Secondary | ICD-10-CM | POA: Diagnosis not present

## 2023-12-16 ENCOUNTER — Encounter: Payer: Self-pay | Admitting: General Practice

## 2023-12-16 NOTE — Progress Notes (Signed)
 [facility] Spiritual Care Note  Referred by Palliative Care to re-introduce Spiritual Care availability for additional layer of support. Left voicemail with direct number, encouraging return call.   [address] Misty Arnold, [place], [facility] Pager [phone] Voicemail [phone]

## 2023-12-18 DIAGNOSIS — Z170 Estrogen receptor positive status [ER+]: Secondary | ICD-10-CM | POA: Diagnosis not present

## 2023-12-18 DIAGNOSIS — F419 Anxiety disorder, unspecified: Secondary | ICD-10-CM | POA: Diagnosis not present

## 2023-12-18 DIAGNOSIS — F331 Major depressive disorder, recurrent, moderate: Secondary | ICD-10-CM | POA: Diagnosis not present

## 2023-12-18 DIAGNOSIS — Z4800 Encounter for change or removal of nonsurgical wound dressing: Secondary | ICD-10-CM | POA: Diagnosis not present

## 2023-12-18 DIAGNOSIS — R45851 Suicidal ideations: Secondary | ICD-10-CM | POA: Diagnosis not present

## 2023-12-18 DIAGNOSIS — C7951 Secondary malignant neoplasm of bone: Secondary | ICD-10-CM | POA: Diagnosis not present

## 2023-12-18 DIAGNOSIS — G893 Neoplasm related pain (acute) (chronic): Secondary | ICD-10-CM | POA: Diagnosis not present

## 2023-12-18 DIAGNOSIS — R530 Neoplastic (malignant) related fatigue: Secondary | ICD-10-CM | POA: Diagnosis not present

## 2023-12-18 DIAGNOSIS — C50411 Malignant neoplasm of upper-outer quadrant of right female breast: Secondary | ICD-10-CM | POA: Diagnosis not present

## 2023-12-20 ENCOUNTER — Telehealth: Payer: Self-pay | Admitting: *Deleted

## 2023-12-20 NOTE — Telephone Encounter (Signed)
RN received call from [place], [place] with [facility] stating pt HR at rest was 128 and with ambulation 129.  Pt stated she had a difficult night with sleep and is experiencing 4/10 pain.  Morrie Sheldon OT states pt just took Xtampza for pain relief. Per MD pt to continue with pain relief medications, rest, and increase p.o fluid intake.  If pt becomes symptomatic with palpitation, chest pain, dizziness, or shob, pt needing to contact our office or seek care at nearest hospital.  Morrie Sheldon, OT notified of MD request and states she will educated pt.

## 2023-12-23 NOTE — Progress Notes (Signed)
Patient last received Keytruda with C2D1 on 12/12/23. Ok for patient to receive C3D1 three days early on 12/30/23 per Dr. Pamelia Hoit.  Drusilla Kanner, PharmD, MBA

## 2023-12-24 ENCOUNTER — Telehealth: Payer: Self-pay | Admitting: Hematology and Oncology

## 2023-12-24 DIAGNOSIS — F419 Anxiety disorder, unspecified: Secondary | ICD-10-CM | POA: Diagnosis not present

## 2023-12-24 DIAGNOSIS — Z170 Estrogen receptor positive status [ER+]: Secondary | ICD-10-CM | POA: Diagnosis not present

## 2023-12-24 DIAGNOSIS — C50411 Malignant neoplasm of upper-outer quadrant of right female breast: Secondary | ICD-10-CM | POA: Diagnosis not present

## 2023-12-24 DIAGNOSIS — R530 Neoplastic (malignant) related fatigue: Secondary | ICD-10-CM | POA: Diagnosis not present

## 2023-12-24 DIAGNOSIS — F331 Major depressive disorder, recurrent, moderate: Secondary | ICD-10-CM | POA: Diagnosis not present

## 2023-12-24 DIAGNOSIS — C7951 Secondary malignant neoplasm of bone: Secondary | ICD-10-CM | POA: Diagnosis not present

## 2023-12-24 DIAGNOSIS — R45851 Suicidal ideations: Secondary | ICD-10-CM | POA: Diagnosis not present

## 2023-12-24 DIAGNOSIS — Z4800 Encounter for change or removal of nonsurgical wound dressing: Secondary | ICD-10-CM | POA: Diagnosis not present

## 2023-12-24 DIAGNOSIS — G893 Neoplasm related pain (acute) (chronic): Secondary | ICD-10-CM | POA: Diagnosis not present

## 2023-12-24 NOTE — Telephone Encounter (Signed)
 Scheduled appointments per WQ. Patient is aware of the made appointments.

## 2023-12-27 ENCOUNTER — Telehealth (HOSPITAL_BASED_OUTPATIENT_CLINIC_OR_DEPARTMENT_OTHER): Payer: Medicare PPO | Admitting: Nurse Practitioner

## 2023-12-27 ENCOUNTER — Encounter: Payer: Self-pay | Admitting: Nurse Practitioner

## 2023-12-27 DIAGNOSIS — G893 Neoplasm related pain (acute) (chronic): Secondary | ICD-10-CM | POA: Diagnosis not present

## 2023-12-27 DIAGNOSIS — Z515 Encounter for palliative care: Secondary | ICD-10-CM

## 2023-12-27 DIAGNOSIS — C7949 Secondary malignant neoplasm of other parts of nervous system: Secondary | ICD-10-CM

## 2023-12-27 NOTE — Telephone Encounter (Signed)
I connected with Misty Arnold on 12/27/23 at 1330 by telephone and verified that I am speaking with the correct person using two identifiers.   I discussed the limitations, risks, security and privacy concerns of performing an evaluation and management service by telemedicine and the availability of in-person appointments. I also discussed with the patient that there may be a patient responsible charge related to this service. The patient expressed understanding and agreed to proceed.   Other persons participating in the visit and their role in the encounter: N/A   Patient's location: Home   Provider's location: [facility]   Chief Complaint: Pain Management  I connected by phone with Misty Arnold after receiving call that her pain has worsened despite current regimen. She states previous days pain was controlled however today is "not the best day"! She is reporting pain is more severe causing her to remain in bed/on the couch to limit activity.   We discussed her regimen at length. She is taking Xtampza 13.5mg  every 8 hours and Oxycodone 10mg  every 3 hours as needed. Today she is requiring breakthrough around the clock every 3-4 hours. I instructed patient if pain was severe to increase to 15-20mg  of oxycodone as needed. She will start with 15mg  and see how her pain is. If remains persistently severe for more than an hour will then take other half. She verbalized understanding. Expresses prior to my call she did take a half and it provided better relief.   Confirmed her awareness of visit on Monday.   All questions answered and support provided.   I provided 25 minutes of non face-to-face telephone visit time during this encounter, and > 50% was spent counseling as documented under my assessment & plan.

## 2023-12-30 ENCOUNTER — Inpatient Hospital Stay: Payer: Medicare PPO | Admitting: Nutrition

## 2023-12-30 ENCOUNTER — Other Ambulatory Visit: Payer: Self-pay

## 2023-12-30 ENCOUNTER — Telehealth: Payer: Self-pay

## 2023-12-30 ENCOUNTER — Encounter: Payer: Self-pay | Admitting: Nutrition

## 2023-12-30 ENCOUNTER — Inpatient Hospital Stay: Payer: Medicare PPO | Admitting: Nurse Practitioner

## 2023-12-30 ENCOUNTER — Inpatient Hospital Stay (HOSPITAL_COMMUNITY)
Admission: EM | Admit: 2023-12-30 | Discharge: 2024-02-17 | DRG: 948 | Disposition: A | Discharge status: Hospice | Payer: Medicare PPO | Source: Ambulatory Visit | Attending: Family Medicine | Admitting: Family Medicine

## 2023-12-30 ENCOUNTER — Inpatient Hospital Stay: Payer: Medicare PPO

## 2023-12-30 ENCOUNTER — Inpatient Hospital Stay (HOSPITAL_BASED_OUTPATIENT_CLINIC_OR_DEPARTMENT_OTHER): Payer: Medicare PPO | Admitting: Hematology and Oncology

## 2023-12-30 ENCOUNTER — Other Ambulatory Visit: Payer: Medicare PPO

## 2023-12-30 ENCOUNTER — Encounter (HOSPITAL_COMMUNITY): Payer: Self-pay

## 2023-12-30 VITALS — BP 125/84 | HR 129 | Temp 98.3°F | Resp 20 | Ht 65.0 in | Wt 120.5 lb

## 2023-12-30 DIAGNOSIS — D75839 Thrombocytosis, unspecified: Secondary | ICD-10-CM | POA: Diagnosis not present

## 2023-12-30 DIAGNOSIS — K5900 Constipation, unspecified: Secondary | ICD-10-CM | POA: Diagnosis not present

## 2023-12-30 DIAGNOSIS — R188 Other ascites: Secondary | ICD-10-CM | POA: Diagnosis not present

## 2023-12-30 DIAGNOSIS — Z9221 Personal history of antineoplastic chemotherapy: Secondary | ICD-10-CM

## 2023-12-30 DIAGNOSIS — E440 Moderate protein-calorie malnutrition: Secondary | ICD-10-CM | POA: Diagnosis not present

## 2023-12-30 DIAGNOSIS — Z803 Family history of malignant neoplasm of breast: Secondary | ICD-10-CM

## 2023-12-30 DIAGNOSIS — Z170 Estrogen receptor positive status [ER+]: Secondary | ICD-10-CM | POA: Diagnosis not present

## 2023-12-30 DIAGNOSIS — M8458XA Pathological fracture in neoplastic disease, other specified site, initial encounter for fracture: Secondary | ICD-10-CM | POA: Diagnosis not present

## 2023-12-30 DIAGNOSIS — R52 Pain, unspecified: Secondary | ICD-10-CM

## 2023-12-30 DIAGNOSIS — C50919 Malignant neoplasm of unspecified site of unspecified female breast: Secondary | ICD-10-CM

## 2023-12-30 DIAGNOSIS — R64 Cachexia: Secondary | ICD-10-CM | POA: Diagnosis present

## 2023-12-30 DIAGNOSIS — J910 Malignant pleural effusion: Secondary | ICD-10-CM | POA: Diagnosis not present

## 2023-12-30 DIAGNOSIS — Z515 Encounter for palliative care: Secondary | ICD-10-CM

## 2023-12-30 DIAGNOSIS — C7949 Secondary malignant neoplasm of other parts of nervous system: Secondary | ICD-10-CM

## 2023-12-30 DIAGNOSIS — C7989 Secondary malignant neoplasm of other specified sites: Secondary | ICD-10-CM | POA: Diagnosis not present

## 2023-12-30 DIAGNOSIS — C7951 Secondary malignant neoplasm of bone: Secondary | ICD-10-CM | POA: Diagnosis not present

## 2023-12-30 DIAGNOSIS — Z791 Long term (current) use of non-steroidal anti-inflammatories (NSAID): Secondary | ICD-10-CM

## 2023-12-30 DIAGNOSIS — Z823 Family history of stroke: Secondary | ICD-10-CM

## 2023-12-30 DIAGNOSIS — C50411 Malignant neoplasm of upper-outer quadrant of right female breast: Secondary | ICD-10-CM

## 2023-12-30 DIAGNOSIS — Z79899 Other long term (current) drug therapy: Secondary | ICD-10-CM

## 2023-12-30 DIAGNOSIS — M549 Dorsalgia, unspecified: Secondary | ICD-10-CM

## 2023-12-30 DIAGNOSIS — C787 Secondary malignant neoplasm of liver and intrahepatic bile duct: Secondary | ICD-10-CM | POA: Diagnosis not present

## 2023-12-30 DIAGNOSIS — Z885 Allergy status to narcotic agent status: Secondary | ICD-10-CM

## 2023-12-30 DIAGNOSIS — Z923 Personal history of irradiation: Secondary | ICD-10-CM

## 2023-12-30 DIAGNOSIS — C7801 Secondary malignant neoplasm of right lung: Secondary | ICD-10-CM | POA: Diagnosis not present

## 2023-12-30 DIAGNOSIS — Z4682 Encounter for fitting and adjustment of non-vascular catheter: Secondary | ICD-10-CM | POA: Diagnosis not present

## 2023-12-30 DIAGNOSIS — F32A Depression, unspecified: Secondary | ICD-10-CM | POA: Diagnosis present

## 2023-12-30 DIAGNOSIS — J9811 Atelectasis: Secondary | ICD-10-CM | POA: Diagnosis not present

## 2023-12-30 DIAGNOSIS — Z681 Body mass index (BMI) 19 or less, adult: Secondary | ICD-10-CM

## 2023-12-30 DIAGNOSIS — R531 Weakness: Secondary | ICD-10-CM | POA: Diagnosis not present

## 2023-12-30 DIAGNOSIS — E860 Dehydration: Secondary | ICD-10-CM | POA: Diagnosis present

## 2023-12-30 DIAGNOSIS — Z888 Allergy status to other drugs, medicaments and biological substances status: Secondary | ICD-10-CM

## 2023-12-30 DIAGNOSIS — Z880 Allergy status to penicillin: Secondary | ICD-10-CM

## 2023-12-30 DIAGNOSIS — Z833 Family history of diabetes mellitus: Secondary | ICD-10-CM

## 2023-12-30 DIAGNOSIS — Z451 Encounter for adjustment and management of infusion pump: Secondary | ICD-10-CM | POA: Diagnosis not present

## 2023-12-30 DIAGNOSIS — Z59811 Housing instability, housed, with risk of homelessness: Secondary | ICD-10-CM | POA: Diagnosis not present

## 2023-12-30 DIAGNOSIS — Z83438 Family history of other disorder of lipoprotein metabolism and other lipidemia: Secondary | ICD-10-CM

## 2023-12-30 DIAGNOSIS — R9431 Abnormal electrocardiogram [ECG] [EKG]: Secondary | ICD-10-CM | POA: Diagnosis present

## 2023-12-30 DIAGNOSIS — Z7952 Long term (current) use of systemic steroids: Secondary | ICD-10-CM

## 2023-12-30 DIAGNOSIS — C50911 Malignant neoplasm of unspecified site of right female breast: Secondary | ICD-10-CM | POA: Diagnosis not present

## 2023-12-30 DIAGNOSIS — Z8249 Family history of ischemic heart disease and other diseases of the circulatory system: Secondary | ICD-10-CM

## 2023-12-30 DIAGNOSIS — D630 Anemia in neoplastic disease: Secondary | ICD-10-CM | POA: Diagnosis present

## 2023-12-30 DIAGNOSIS — F419 Anxiety disorder, unspecified: Secondary | ICD-10-CM | POA: Diagnosis present

## 2023-12-30 DIAGNOSIS — I10 Essential (primary) hypertension: Secondary | ICD-10-CM | POA: Diagnosis present

## 2023-12-30 DIAGNOSIS — Z79891 Long term (current) use of opiate analgesic: Secondary | ICD-10-CM

## 2023-12-30 DIAGNOSIS — R0789 Other chest pain: Secondary | ICD-10-CM | POA: Diagnosis not present

## 2023-12-30 DIAGNOSIS — C799 Secondary malignant neoplasm of unspecified site: Secondary | ICD-10-CM

## 2023-12-30 DIAGNOSIS — Z66 Do not resuscitate: Secondary | ICD-10-CM | POA: Diagnosis not present

## 2023-12-30 DIAGNOSIS — R627 Adult failure to thrive: Secondary | ICD-10-CM

## 2023-12-30 DIAGNOSIS — J90 Pleural effusion, not elsewhere classified: Secondary | ICD-10-CM | POA: Diagnosis not present

## 2023-12-30 DIAGNOSIS — Z48813 Encounter for surgical aftercare following surgery on the respiratory system: Secondary | ICD-10-CM | POA: Diagnosis not present

## 2023-12-30 DIAGNOSIS — Z9011 Acquired absence of right breast and nipple: Secondary | ICD-10-CM

## 2023-12-30 DIAGNOSIS — Z800 Family history of malignant neoplasm of digestive organs: Secondary | ICD-10-CM

## 2023-12-30 DIAGNOSIS — G893 Neoplasm related pain (acute) (chronic): Principal | ICD-10-CM | POA: Diagnosis present

## 2023-12-30 DIAGNOSIS — Z90710 Acquired absence of both cervix and uterus: Secondary | ICD-10-CM

## 2023-12-30 DIAGNOSIS — Z5982 Transportation insecurity: Secondary | ICD-10-CM | POA: Diagnosis not present

## 2023-12-30 DIAGNOSIS — R54 Age-related physical debility: Secondary | ICD-10-CM | POA: Diagnosis present

## 2023-12-30 LAB — TSH: TSH: 4.223 u[IU]/mL (ref 0.350–4.500)

## 2023-12-30 LAB — CMP (CANCER CENTER ONLY)
ALT: 31 U/L (ref 0–44)
AST: 69 U/L — ABNORMAL HIGH (ref 15–41)
Albumin: 3.3 g/dL — ABNORMAL LOW (ref 3.5–5.0)
Alkaline Phosphatase: 158 U/L — ABNORMAL HIGH (ref 38–126)
Anion gap: 6 (ref 5–15)
BUN: 9 mg/dL (ref 6–20)
CO2: 26 mmol/L (ref 22–32)
Calcium: 9.1 mg/dL (ref 8.9–10.3)
Chloride: 108 mmol/L (ref 98–111)
Creatinine: 0.92 mg/dL (ref 0.44–1.00)
GFR, Estimated: >60 mL/min (ref >60)
Glucose, Bld: 93 mg/dL (ref 70–99)
Potassium: 4.1 mmol/L (ref 3.5–5.1)
Sodium: 140 mmol/L (ref 135–145)
Total Bilirubin: 0.3 mg/dL (ref 0.0–1.2)
Total Protein: 7.3 g/dL (ref 6.5–8.1)

## 2023-12-30 LAB — CBC WITH DIFFERENTIAL (CANCER CENTER ONLY)
Abs Immature Granulocytes: 0.06 10*3/uL (ref 0.00–0.07)
Basophils Absolute: 0.1 10*3/uL (ref 0.0–0.1)
Basophils Relative: 0 %
Eosinophils Absolute: 0.1 10*3/uL (ref 0.0–0.5)
Eosinophils Relative: 0 %
HCT: 34.9 % — ABNORMAL LOW (ref 36.0–46.0)
Hemoglobin: 10.8 g/dL — ABNORMAL LOW (ref 12.0–15.0)
Immature Granulocytes: 1 %
Lymphocytes Relative: 14 %
Lymphs Abs: 1.6 10*3/uL (ref 0.7–4.0)
MCH: 26.7 pg (ref 26.0–34.0)
MCHC: 30.9 g/dL (ref 30.0–36.0)
MCV: 86.2 fL (ref 80.0–100.0)
Monocytes Absolute: 0.9 10*3/uL (ref 0.1–1.0)
Monocytes Relative: 8 %
Neutro Abs: 8.6 10*3/uL — ABNORMAL HIGH (ref 1.7–7.7)
Neutrophils Relative %: 77 %
Platelet Count: 531 10*3/uL — ABNORMAL HIGH (ref 150–400)
RBC: 4.05 MIL/uL (ref 3.87–5.11)
RDW: 17.5 % — ABNORMAL HIGH (ref 11.5–15.5)
WBC Count: 11.3 10*3/uL — ABNORMAL HIGH (ref 4.0–10.5)
nRBC: 0 % (ref 0.0–0.2)

## 2023-12-30 LAB — CBC
HCT: 35.3 % — ABNORMAL LOW (ref 36.0–46.0)
Hemoglobin: 10.9 g/dL — ABNORMAL LOW (ref 12.0–15.0)
MCH: 27.3 pg (ref 26.0–34.0)
MCHC: 30.9 g/dL (ref 30.0–36.0)
MCV: 88.3 fL (ref 80.0–100.0)
Platelets: 509 10*3/uL — ABNORMAL HIGH (ref 150–400)
RBC: 4 MIL/uL (ref 3.87–5.11)
RDW: 17.6 % — ABNORMAL HIGH (ref 11.5–15.5)
WBC: 11.7 10*3/uL — ABNORMAL HIGH (ref 4.0–10.5)
nRBC: 0 % (ref 0.0–0.2)

## 2023-12-30 MED ORDER — SENNOSIDES-DOCUSATE SODIUM 8.6-50 MG PO TABS: 1.0000 | ORAL_TABLET | Freq: Every evening | ORAL | Status: DC | PRN

## 2023-12-30 MED ORDER — ATENOLOL 50 MG PO TABS
50.0000 mg | ORAL_TABLET | Freq: Two times a day (BID) | ORAL | Status: DC
  Administered 2023-12-30 – 2024-02-14 (×71): 50 mg via ORAL
  Filled 2023-12-30 (×96): qty 1

## 2023-12-30 MED ORDER — SODIUM CHLORIDE 0.9 % IV SOLN: INTRAVENOUS | Status: AC

## 2023-12-30 MED ORDER — MIRTAZAPINE 15 MG PO TABS
15.0000 mg | ORAL_TABLET | Freq: Every day | ORAL | Status: DC
  Administered 2023-12-30 – 2024-02-16 (×49): 15 mg via ORAL
  Filled 2023-12-30 (×49): qty 1

## 2023-12-30 MED ORDER — OXYCODONE HCL ER 15 MG PO T12A
15.0000 mg | EXTENDED_RELEASE_TABLET | Freq: Two times a day (BID) | ORAL | Status: DC
  Administered 2023-12-30 – 2023-12-31 (×2): 15 mg via ORAL
  Filled 2023-12-30 (×2): qty 1

## 2023-12-30 MED ORDER — POLYETHYLENE GLYCOL 3350 17 G PO PACK: 17.0000 g | PACK | Freq: Every day | ORAL | Status: DC | PRN

## 2023-12-30 MED ORDER — OXYCODONE HCL 5 MG PO TABS
10.0000 mg | ORAL_TABLET | ORAL | Status: DC | PRN
  Administered 2023-12-30 – 2024-01-02 (×7): 10 mg via ORAL
  Filled 2023-12-30 (×10): qty 2

## 2023-12-30 MED ORDER — ONDANSETRON HCL 4 MG PO TABS: 4.0000 mg | ORAL_TABLET | Freq: Four times a day (QID) | ORAL | Status: DC | PRN

## 2023-12-30 MED ORDER — MULTI-VITAMIN/MINERALS PO TABS: 1.0000 | ORAL_TABLET | ORAL | Status: DC

## 2023-12-30 MED ORDER — CELECOXIB 200 MG PO CAPS
200.0000 mg | ORAL_CAPSULE | Freq: Every day | ORAL | Status: DC
Start: 2023-12-30 — End: 2024-02-17
  Administered 2023-12-30 – 2024-02-16 (×49): 200 mg via ORAL
  Filled 2023-12-30 (×49): qty 1

## 2023-12-30 MED ORDER — ACETAMINOPHEN 325 MG PO TABS
650.0000 mg | ORAL_TABLET | Freq: Four times a day (QID) | ORAL | Status: DC | PRN
  Administered 2024-01-02 – 2024-02-10 (×4): 650 mg via ORAL
  Filled 2023-12-30 (×4): qty 2

## 2023-12-30 MED ORDER — RIVAROXABAN 10 MG PO TABS
10.0000 mg | ORAL_TABLET | Freq: Every day | ORAL | Status: DC
  Administered 2023-12-30 – 2023-12-31 (×2): 10 mg via ORAL
  Filled 2023-12-30 (×3): qty 1

## 2023-12-30 MED ORDER — HYDROMORPHONE HCL 1 MG/ML IJ SOLN
0.5000 mg | Freq: Once | INTRAMUSCULAR | Status: AC
  Administered 2023-12-30: 0.5 mg via INTRAVENOUS
  Filled 2023-12-30: qty 1

## 2023-12-30 MED ORDER — HYDROMORPHONE HCL 1 MG/ML IJ SOLN
1.0000 mg | INTRAMUSCULAR | Status: DC | PRN
  Administered 2023-12-30 – 2023-12-31 (×4): 1 mg via INTRAVENOUS
  Filled 2023-12-30 (×4): qty 1

## 2023-12-30 MED ORDER — ACETAMINOPHEN 650 MG RE SUPP: 650.0000 mg | Freq: Four times a day (QID) | RECTAL | Status: DC | PRN

## 2023-12-30 MED ORDER — LACTATED RINGERS IV BOLUS
1000.0000 mL | Freq: Once | INTRAVENOUS | Status: AC
  Administered 2023-12-30: 1000 mL via INTRAVENOUS

## 2023-12-30 MED ORDER — HYDROMORPHONE HCL 1 MG/ML IJ SOLN: 1.0000 mg | Freq: Once | INTRAMUSCULAR | Status: DC

## 2023-12-30 MED ORDER — ONDANSETRON HCL 4 MG/2ML IJ SOLN: 4.0000 mg | Freq: Four times a day (QID) | INTRAMUSCULAR | Status: DC | PRN

## 2023-12-30 MED ORDER — SENNOSIDES-DOCUSATE SODIUM 8.6-50 MG PO TABS
1.0000 | ORAL_TABLET | Freq: Every day | ORAL | Status: DC
  Administered 2023-12-30: 1 via ORAL
  Filled 2023-12-30: qty 1

## 2023-12-30 MED ORDER — METRONIDAZOLE 0.75 % EX GEL
Freq: Two times a day (BID) | CUTANEOUS | Status: DC
  Administered 2023-12-30 – 2024-01-05 (×3): 1 via TOPICAL
  Filled 2023-12-30 (×2): qty 45

## 2023-12-30 MED ORDER — TAB-A-VITE/IRON PO TABS
1.0000 | ORAL_TABLET | Freq: Every day | ORAL | Status: DC
  Administered 2023-12-31 – 2024-02-17 (×49): 1 via ORAL
  Filled 2023-12-30 (×49): qty 1

## 2023-12-30 MED ORDER — BOOST / RESOURCE BREEZE PO LIQD CUSTOM
1.0000 | Freq: Three times a day (TID) | ORAL | Status: DC
  Administered 2023-12-30 – 2024-01-14 (×35): 1 via ORAL
  Administered 2024-01-14: 237 mL via ORAL
  Administered 2024-01-15 – 2024-02-06 (×37): 1 via ORAL
  Administered 2024-02-07: 237 mL via ORAL
  Administered 2024-02-07: 1 via ORAL
  Administered 2024-02-07 – 2024-02-08 (×2): 237 mL via ORAL
  Administered 2024-02-08 – 2024-02-17 (×20): 1 via ORAL
  Filled 2023-12-30: qty 1

## 2023-12-30 MED ORDER — HYDROMORPHONE HCL 1 MG/ML IJ SOLN: 0.5000 mg | Freq: Once | INTRAMUSCULAR | Status: DC

## 2023-12-30 NOTE — ED Provider Notes (Signed)
 [place] [facility] DEPARTMENT AT [facility] Provider Note   CSN: [id number] Arrival date & time: 12/30/23  1214     History  Chief Complaint  Patient presents with   Back Pain    Misty Arnold is a 53 y.o. female.  Patient with progressive metastatic breast cancer to chest wall and spine/bones with c/o uncontrolled mid back pain. Was seen at cancer center today for same and sent to ED for admission and palliative care evaluation. Pt notes generally poor appetite, poor po intake, feels dehydrated. No fevers. No chest pain. No sob. No extremity swelling.   The history is provided by the patient, medical records and a caregiver.  Back Pain Associated symptoms: no abdominal pain, no chest pain, no dysuria, no fever and no headaches        Home Medications Prior to Admission medications   Medication Sig Start Date End Date Taking? Authorizing Provider  atenolol (TENORMIN) 50 MG tablet Take 1 tablet (50 mg total) by mouth 2 (two) times daily. 04/24/22   Serena Croissant, MD  celecoxib (CELEBREX) 200 MG capsule Take 1 capsule (200 mg total) by mouth at bedtime. 10/11/23   Serena Croissant, MD  dexamethasone (DECADRON) 1 MG tablet Take 2 tablets (2 mg total) by mouth daily. 12/12/23   Serena Croissant, MD  diazepam (VALIUM) 5 MG tablet Take 1 tablet (5 mg total) by mouth every 6 (six) hours as needed for muscle spasms. 12/11/23   Serena Croissant, MD  magnesium oxide (MAG-OX) 400 (240 Mg) MG tablet Take 1 tablet (400 mg total) by mouth 2 (two) times daily. 12/10/23   Edsel Petrin, DO  metroNIDAZOLE (METROGEL) 0.75 % gel Apply topically 2 (two) times daily. 11/14/23   Serena Croissant, MD  Metronidazole Benzoate POWD 1 Dose by Does not apply route daily. 12/10/23   Edsel Petrin, DO  mirtazapine (REMERON) 15 MG tablet Take 1 tablet (15 mg total) by mouth at bedtime. 12/10/23 01/09/24  Lanae Boast, MD  Multiple Vitamins-Minerals (MULTIVITAMIN WITH MINERALS) tablet FLORADIX Supplement-Iron  +minerals 10/16/21   Anderson Malta L, DO  ondansetron (ZOFRAN) 8 MG tablet Take 1 tablet (8 mg total) by mouth every 8 (eight) hours as needed for nausea or vomiting. 11/14/23   Rachel Moulds, MD  oxyCODONE ER [facility]) 13.5 MG C12A Take 1 capsule by mouth every 8 (eight) hours. 12/10/23   Edsel Petrin, DO  Oxycodone HCl 10 MG TABS Take 1 tablet (10 mg total) by mouth every 3 (three) hours as needed ((score 7 to 10)). 11/11/23   Pickenpack-Cousar, Arty Baumgartner, NP  polyethylene glycol (MIRALAX / GLYCOLAX) 17 g packet Take 17 g by mouth daily. 10/16/21   Edsel Petrin, DO  potassium chloride SA (KLOR-CON M) 20 MEQ tablet Take 1 tablet by mouth daily. 12/12/23   Serena Croissant, MD  prochlorperazine (COMPAZINE) 10 MG tablet Take 1 tablet (10 mg total) by mouth every 6 (six) hours as needed for nausea or vomiting. 11/14/23   Rachel Moulds, MD  Silver-Carboxymethylcellulose (AQUACEL AG ADVANTAGE) 4"X5" PADS Apply 1 Pad topically in the morning and at bedtime. 07/03/23   Serena Croissant, MD  Vitamin D, Ergocalciferol, (DRISDOL) 1.25 MG (50000 UNIT) CAPS capsule Take 1 capsule (50,000 Units total) by mouth every 7 (seven) days. 12/10/23   Edsel Petrin, DO      Allergies    Amoxicillin, Penicillins, Pork-derived products, Penicillin g, Tamoxifen, and Tramadol    Review of Systems   Review of Systems  Constitutional:  Negative for chills and fever.  Respiratory:  Negative for cough and shortness of breath.   Cardiovascular:  Negative for chest pain.  Gastrointestinal:  Negative for abdominal pain and vomiting.  Genitourinary:  Negative for dysuria and flank pain.  Musculoskeletal:  Positive for back pain. Negative for neck pain.  Skin:  Negative for rash.  Neurological:  Negative for headaches.    Physical Exam Updated Vital Signs BP (!) 158/99   Pulse (!) 132   Temp 98.3 F (36.8 C) (Oral)   Resp 20   Ht 1.651 m (5\' 5" )   Wt 54.4 kg   SpO2 98%   BMI 19.97 kg/m  Physical  Exam Vitals and nursing note reviewed.  Constitutional:      Appearance: She is well-developed.     Comments: Weak, frail/cachectic appearing.   HENT:     Head: Atraumatic.     Nose: Nose normal.     Mouth/Throat:     Mouth: Mucous membranes are moist.  Eyes:     General: No scleral icterus.    Conjunctiva/sclera: Conjunctivae normal.     Pupils: Pupils are equal, round, and reactive to light.  Neck:     Trachea: No tracheal deviation.     Comments: No stiffness or rigidity Cardiovascular:     Rate and Rhythm: Regular rhythm. Tachycardia present.     Pulses: Normal pulses.     Heart sounds: Normal heart sounds. No murmur heard.    No friction rub. No gallop.  Pulmonary:     Effort: Pulmonary effort is normal. No respiratory distress.     Breath sounds: Normal breath sounds.     Comments: Large anterior chest wall mass/cancer Abdominal:     General: There is no distension.     Palpations: Abdomen is soft.     Tenderness: There is no abdominal tenderness.  Genitourinary:    Comments: No cva tenderness.  Musculoskeletal:        General: No swelling or tenderness.     Cervical back: Normal range of motion and neck supple. No rigidity. No muscular tenderness.     Comments: Spine aligned. Well healed thoracic surgery scar without sign of infection. No focal/point bony tenderness or sts.   Skin:    General: Skin is warm and dry.     Findings: No rash.  Neurological:     Mental Status: She is alert.     Comments: Alert, speech normal. Motor/sens grossly intact bil.   Psychiatric:        Mood and Affect: Mood normal.     ED Results / Procedures / Treatments   Labs (all labs ordered are listed, but only abnormal results are displayed) Results for orders placed or performed during the hospital encounter of 12/30/23  CBC   Collection Time: 12/30/23 12:57 PM  Result Value Ref Range   WBC 11.7 (H) 4.0 - 10.5 K/uL   RBC 4.00 3.87 - 5.11 MIL/uL   Hemoglobin 10.9 (L) 12.0 -  15.0 g/dL   HCT 16.1 (L) 09.6 - 04.5 %   MCV 88.3 80.0 - 100.0 fL   MCH 27.3 26.0 - 34.0 pg   MCHC 30.9 30.0 - 36.0 g/dL   RDW 40.9 (H) 81.1 - 91.4 %   Platelets 509 (H) 150 - 400 K/uL   nRBC 0.0 0.0 - 0.2 %   *Note: Due to a large number of results and/or encounters for the requested time period, some results have not been displayed.  A complete set of results can be found in Results Review.   MR Lumbar Spine W Wo Contrast Result Date: 12/04/2023 CLINICAL DATA:  Low back pain, cauda equina syndrome suspected known metastatic disease EXAM: MRI LUMBAR SPINE WITHOUT AND WITH CONTRAST TECHNIQUE: Multiplanar and multiecho pulse sequences of the lumbar spine were obtained without and with intravenous contrast. CONTRAST:  5mL GADAVIST GADOBUTROL 1 MMOL/ML IV SOLN COMPARISON:  MRI lumbar spine March 1, 21. CT abdomen/pelvis October 28 24. FINDINGS: Segmentation:  Standard. Alignment:  No substantial sagittal subluxation. Vertebrae: New heterogeneous T1 and T2 hyperintense lesion involving the T12 vertebral body and the posterior elements without associated enhancement. 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 Conus medullaris and cauda equina: Conus extends to the L2 level. Conus and cauda equina appear normal. Paraspinal and other soft tissues: Partially imaged extensive lesions throughout the liver, suspicious for metastatic disease. Also known soft tissue mass involving right rib is partially imaged. Disc levels: T12-L1 through L3-L4: No significant disc protrusion, foraminal stenosis, or canal stenosis. L4-L5: Mild disc bulging and mild facet arthropathy. No significant stenosis. L5-S1: Mild disc bulging. Mild facet arthropathy. No significant stenosis. IMPRESSION: 1. Partially imaged numerous liver lesions, concerning for metastases and new since September 02, 2023. Recommend dedicated CT or MRI of the abdomen with contrast for further evaluation. 2. T12 vertebral body lesion does not have  typical characteristics of a metastasis and does not enhance; however, findings are suspicious for metastatic disease (maybe treated) given this lesion is new since prior MRI lumbar spine and given bony findings on recent CT of the chest. Atypical hemangioma is a consideration. A six-month follow-up MRI with contrast could ensure stability if clinically warranted. 3. Otherwise, somewhat diffusely heterogeneous bone marrow without definite other suspicious bone lesion. 4. No evidence of extraosseous extension of tumor or significant canal or foraminal stenosis. Electronically Signed   By: Feliberto Harts M.D.   On: 12/04/2023 20:29    EKG EKG Interpretation Date/Time:  Monday December 30 2023 13:05:20 EST Ventricular Rate:  125 PR Interval:  113 QRS Duration:  48 QT Interval:  356 QTC Calculation: 514 R Axis:   84  Text Interpretation: Sinus tachycardia Nonspecific T wave abnormality Confirmed by Cathren Laine ([id number]) on 12/30/2023 2:18:32 PM  Radiology No results found.  Procedures Procedures    Medications Ordered in ED Medications  lactated ringers bolus 1,000 mL (1,000 mLs Intravenous New Bag/Given 12/30/23 1318)  HYDROmorphone (DILAUDID) injection 0.5 mg (0.5 mg Intravenous Given 12/30/23 1317)    ED Course/ Medical Decision Making/ A&P                                 Medical Decision Making Problems Addressed: Failure to thrive in adult: acute illness or injury    Details: Acute/chronic Generalized weakness: acute illness or injury    Details: Acute on chronic Intractable back pain: acute illness or injury    Details: Acute on chronic Metastasis from breast cancer [facility]): chronic illness or injury with exacerbation, progression, or side effects of treatment that poses a threat to life or bodily functions Metastasis to bone [facility]): chronic illness or injury with exacerbation, progression, or side effects of treatment that poses a threat to life or bodily  functions Protein-calorie malnutrition, moderate (HCC): chronic illness or injury that poses a threat to life or bodily functions  Amount and/or Complexity of Data Reviewed External Data Reviewed: notes. Labs: ordered. Decision-making  details documented in ED Course. Radiology: independent interpretation performed. Decision-making details documented in ED Course. ECG/medicine tests: ordered and independent interpretation performed. Decision-making details documented in ED Course. Discussion of management or test interpretation with external provider(s): Palliative care, medicine  Risk Prescription drug management. Parenteral controlled substances. Decision regarding hospitalization.   Iv ns. Continuous pulse ox and cardiac monitoring. Labs ordered/sent.   Differential diagnosis includes metastatic cancer, bone met pain, uncontrolled pain, failure to thrive, dehydration, etc. Dispo decision including potential need for admission considered - will get labs and imaging and reassess.   Reviewed nursing notes and prior charts for additional history. External reports reviewed. Additional history from: heme/onc provider.   Cardiac monitor: sinus rhythm, rate 115.   Labs reviewed/interpreted by me - wbc 11, hgb 11. Chem (from cancer center earlier today) unremarkable x albumin mildly, chronically low.   Recent CTs reviewed/interpreted by me - multiple metastatic lesions to chest, abd, spine/bone.   Dilaudid iv. LR bolus iv.   Palliative care team consulted. Discussed pt, they will see in consult.   Hospitalists consulted for admission.          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

## 2023-12-30 NOTE — Progress Notes (Deleted)
 Palliative Medicine [facility]  [phone] [phone]   Name: Misty Arnold Date: 12/30/2023 MRN: [id number]  DOB: [birth year]  Patient Care Team: Serena Croissant, MD as PCP - General (Hematology and Oncology)    INTERVAL HISTORY: Misty Arnold is a 53 y.o. female with right metastatic breast cancer s/p mastectomy, large right chest wound secondary to necrotic mass, weankess due to T9 vertebral compression s/p tumor resection and decompressive laminectomy, and hypertension. Patient did not receive treatment for some time after seeking care in [place]. Currently receiving Enhertu treatment. Palliative following for symptom management and goals of care.   SOCIAL HISTORY:    Misty Arnold reports that she has never smoked. She has never used smokeless tobacco. She reports that she does not currently use alcohol. She reports that she does not use drugs.  ADVANCE DIRECTIVES:  Advance directives completed during recent hospitalization 08/27/2021.  Documents are on file and have been reviewed and [name].  CODE STATUS: Full code  PAST MEDICAL HISTORY: Past Medical History:  Diagnosis Date   Abnormal uterine bleeding    Anemia    Breast cancer (HCC)    Depression    Fibroid    Hormone disorder    Hypertension    Infertility, female    ALLERGIES:  is allergic to amoxicillin, penicillins, pork-derived products, penicillin g, tamoxifen, and tramadol.  MEDICATIONS:  Current Outpatient Medications  Medication Sig Dispense Refill   atenolol (TENORMIN) 50 MG tablet Take 1 tablet (50 mg total) by mouth 2 (two) times daily. 180 tablet 3   celecoxib (CELEBREX) 200 MG capsule Take 1 capsule (200 mg total) by mouth at bedtime. 30 capsule 6   dexamethasone (DECADRON) 1 MG tablet Take 2 tablets (2 mg total) by mouth daily. 30 tablet 3   diazepam (VALIUM) 5 MG tablet Take 1 tablet (5 mg total) by mouth every 6 (six) hours as needed for muscle spasms. 60 tablet  3   magnesium oxide (MAG-OX) 400 (240 Mg) MG tablet Take 1 tablet (400 mg total) by mouth 2 (two) times daily. 60 tablet 6   metroNIDAZOLE (METROGEL) 0.75 % gel Apply topically 2 (two) times daily. 135 g 6   Metronidazole Benzoate POWD 1 Dose by Does not apply route daily. 1 g 3   mirtazapine (REMERON) 15 MG tablet Take 1 tablet (15 mg total) by mouth at bedtime. 30 tablet 0   Multiple Vitamins-Minerals (MULTIVITAMIN WITH MINERALS) tablet FLORADIX Supplement-Iron +minerals     ondansetron (ZOFRAN) 8 MG tablet Take 1 tablet (8 mg total) by mouth every 8 (eight) hours as needed for nausea or vomiting. 30 tablet 1   oxyCODONE ER (XTAMPZA ER) 13.5 MG C12A Take 1 capsule by mouth every 8 (eight) hours. 90 capsule 0   Oxycodone HCl 10 MG TABS Take 1 tablet (10 mg total) by mouth every 3 (three) hours as needed ((score 7 to 10)). 180 tablet 0   polyethylene glycol (MIRALAX / GLYCOLAX) 17 g packet Take 17 g by mouth daily. 30 each 0   potassium chloride SA (KLOR-CON M) 20 MEQ tablet Take 1 tablet by mouth daily. 30 tablet 6   prochlorperazine (COMPAZINE) 10 MG tablet Take 1 tablet (10 mg total) by mouth every 6 (six) hours as needed for nausea or vomiting. 30 tablet 1   Silver-Carboxymethylcellulose (AQUACEL AG ADVANTAGE) 4"X5" PADS Apply 1 Pad topically in the morning and at bedtime. 60 each 3   Vitamin D, Ergocalciferol, (DRISDOL) 1.25 MG (50000 UNIT)  CAPS capsule Take 1 capsule (50,000 Units total) by mouth every 7 (seven) days. 12 capsule 0   No current facility-administered medications for this visit.    VITAL SIGNS: There were no vitals taken for this visit. There were no vitals filed for this visit.   Estimated body mass index is 21.05 kg/m as calculated from the following:   Height as of 12/12/23: 5\' 5"  (1.651 m).   Weight as of 12/12/23: 126 lb 8 oz (57.4 kg).  PERFORMANCE STATUS (ECOG) : 1 - Symptomatic but completely ambulatory  Assessment NAD Normal breathing pattern Right open chest  wound with small nodules  AAO x4  Discussed the use of AI scribe software for clinical note transcription with the patient, who gave verbal consent to proceed.   IMPRESSION:  I connected by phone with Misty Arnold after receiving call that her pain has worsened despite current regimen. She states previous days pain was controlled however today is "not the best day"! She is reporting pain is more severe causing her to remain in bed/on the couch to limit activity.    We discussed her regimen at length. She is taking Xtampza 13.5mg  every 8 hours and Oxycodone 10mg  every 3 hours as needed. Today she is requiring breakthrough around the clock every 3-4 hours. I instructed patient if pain was severe to increase to 15-20mg  of oxycodone as needed. She will start with 15mg  and see how her pain is. If remains persistently severe for more than an hour will then take other half. She verbalized understanding. Expresses prior to my call she did take a half and it provided better relief.   PLAN:    Patient expressed understanding and was in agreement with this plan. She also understands that she can call the clinic at any time with any questions, concerns, or complaints.   Visit consisted of counseling and education dealing with the complex and emotionally intense issues of symptom management and palliative care in the setting of serious and potentially life-threatening illness.  Willette Alma, AGPCNP-BC  Palliative Medicine Team/[facility]

## 2023-12-30 NOTE — ED Triage Notes (Signed)
 Pt to ED from cancer center. Pt reportedly has breast cancer with mets to spinal canal, liver, and chest wall. Intractable back pain today, unable to do any treatments. Seeking palliative care consult.

## 2023-12-30 NOTE — Assessment & Plan Note (Signed)
 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   Treatment summary: 1. Right mastectomy 08/29/2015 at [facility] (Dr.Singh): IDC with invol of skin and skin ulceration, breast capsule inv cancer, superior medial margin positive, 1/1 LN positive, grade 3, 14.3 cm, 3.5 cm, ALI, chest wall involv, ER 90-100%, PR 80-90%, HER-2 negative, Ki 67 40-50% T4CN1 (St 3B) 2. Patient started tamoxifen but developed severe headache with severe hypertension and tamoxifen was discontinued 03/17/2016.  3. 02/2017 started on Exemestane, Zoladex started 05/29/2017 4.  Hospitalization for paraplegia: T9 vertebral body compression status post decompressive laminectomy and tumor resection 01/04/2020 5.  Palliative radiation to the thoracic spine 01/28/2020 6.  Ibrance with letrozole started April 2021 discontinued 07/24/2021 7.  Enhertu started 10/17/2021 every 3 weeks discontinued May 2024 due to progression 8.  Elacestrant 04/10/2023-09/19/2023 ----------------------------------------------------------------------------------------------------------------- CT chest 03/10/2023: Progressive osseous and chest wall metastasis (sternal mass 6.7 cm previously 6.2, manubrium 2.4 cm previously 1.5 cm, right posterior ninth rib 4.2 cm was previously 3.6 cm, right chest wall mass 6.4 cm was previously 6.1 cm, adjacent 4.6 cm chest wall mass previously 4.1 cm   Treatment plan:  Toxicities: Fatigue Vertigo: Brain MRI negative Wound care issues: Patient has home health.  I sent prescription for her supplies.   CT CAP 09/13/2023: Significant progression of disease.  Increase in the chest wall masses in size and number (6.7 cm 4.7 cm, 8.9 cm was 2.5 x 4.2 cm , 7.6 cm  used to be 4.8 cm, 6.3 cm was 4.5 cm), increasing right pleural effusion increasing prevascular lymph node and bilateral axillary lymph nodes, right supraclavicular and right hemipelvis.  Increasing edema in the posterior mediastinum increased size of liver metastases) 3.9 cm used to be 2.1 cm)   Guardant360 09/29/2023: ESR 1 mutation, T p53, FGF R1 amplification, GA TA 3, ATRX splice site SNV were positive.  TMB: 11.39, MSI high not detected   Treatment plan: Immunotherapy with Keytruda (TMB high) started 11/14/2023, today cycle 3 Hospitalization 12/04/2023-12/10/2023: Pain control, depression/anxiety: Palliative care following Return to clinic every 3 weeks for [facility]

## 2023-12-30 NOTE — Telephone Encounter (Signed)
 Patient scheduled to see palliative care today 12/30/23 after medical oncology visit. Treatment canceled, med-onc team took pt to ED to be evaluated for pain prior to palliative care appointment. This RN notified by med-onc team after patient was taken to ED of this, Lowella Bandy, NP made aware as well.

## 2023-12-30 NOTE — H&P (Signed)
 History and Physical  Misty Arnold WUJ:[id number] DOB: [birth year] DOA: 12/30/2023  PCP: Serena Croissant, MD   Chief Complaint: Intractable back pain   HPI: Misty Arnold is a 53 y.o. female with medical history significant for metastatic right breast cancer (s/p mastectomy, palliative radiation/chemotherapy, currently on Keytruda), chronic fungating right chest wall mass, spinal metastases (s/p T7-T10 decompressive laminectomy), chronic cancer associated pain, depression/anxiety who was sent directly from the cancer center to the ED for evaluation of uncontrolled cancer associated pain. Patient reports she does have worsening back and breast pain over the last week, requiring frequent use of her oxycodone. This morning, the pain became significantly worse especially in her back. She took the last dose of her oxy this morning. She was able about her oncologist who recommended hospitalization for pain management and palliative care consult. Patient states she still in pain, describing the pain as 12/10 mostly in her upper back. She reports poor p.o. intake over the last few days due to the pain but denies any shortness of breath, headaches, abdominal pain, nausea, vomiting, fevers or chills.  ED Course: Initial vitals show tachycardia with HR in the 120s but otherwise unremarkable. CBC and CMP from cancer center overall unremarkable. EKG shows sinus tach with prolonged QTc of 515. Patient was given IV LR 1 L bolus and IV Dilaudid 0.5 mg x 1. Palliative care was consulted for evaluation. TRH was consulted for admission  Review of Systems: Please see HPI for pertinent positives and negatives. A complete 10 system review of systems are otherwise negative.  Past Medical History:  Diagnosis Date   Abnormal uterine bleeding    Anemia    Breast cancer (HCC)    Depression    Fibroid    Hormone disorder    Hypertension    Infertility, female    Past Surgical History:  Procedure Laterality Date    ABDOMINAL HYSTERECTOMY     BREAST SURGERY     LAMINECTOMY N/A 01/04/2020   Procedure: Thoracic Eight, Thoracic Nine THORACIC LAMINECTOMY FOR TUMOR with Thoracic Seven-Thoracic Ten instrumentation;  Surgeon: Julio Sicks, MD;  Location: [facility] OR;  Service: Neurosurgery;  Laterality: N/A;  Thoracic Eight, Thoracic Nine THORACIC LAMINECTOMY FOR TUMOR with Thoracic Seven-Thoracic Ten instrumentation   MASTECTOMY     salivary gland stone     Social History:  reports that she has never smoked. She has never used smokeless tobacco. She reports that she does not currently use alcohol. She reports that she does not use drugs.  Allergies  Allergen Reactions   Amoxicillin     Other reaction(s): rash/itching   Penicillins Rash    Did it involve swelling of the face/tongue/throat, SOB, or low BP? no Did it involve sudden or severe rash/hives, skin peeling, or any reaction on the inside of your mouth or nose? Yes Did you need to seek medical attention at a hospital or doctor's office? No When did it last happen? early 20's     If all above answers are "NO", may proceed with cephalosporin use.   Pork-Derived Products    Penicillin G    Tamoxifen Hypertension   Tramadol Other (See Comments)    Increases her blood pressure    Family History  Problem Relation Age of Onset   Stroke Mother    Heart attack Mother    Diabetes Mother    Hyperlipidemia Mother    Heart disease Mother    Diabetes Father    Stroke Father    Stroke Maternal Grandmother  Cancer Maternal Grandfather    Colon cancer Maternal Grandfather    Stroke Maternal Grandfather    Stroke Paternal Grandmother    Heart attack Paternal Grandmother    Heart failure Paternal Grandmother    Diabetes Paternal Grandmother    Hyperlipidemia Paternal Grandmother    CAD Paternal Grandmother    Cancer Paternal Grandfather    Colon cancer Paternal Grandfather    Cancer Maternal Aunt 27       breast   Stroke Maternal Aunt    Heart attack  Maternal Aunt    Breast cancer Maternal Aunt      Prior to Admission medications   Medication Sig Start Date End Date Taking? Authorizing Provider  atenolol (TENORMIN) 50 MG tablet Take 1 tablet (50 mg total) by mouth 2 (two) times daily. 04/24/22   Serena Croissant, MD  celecoxib (CELEBREX) 200 MG capsule Take 1 capsule (200 mg total) by mouth at bedtime. 10/11/23   Serena Croissant, MD  dexamethasone (DECADRON) 1 MG tablet Take 2 tablets (2 mg total) by mouth daily. 12/12/23   Serena Croissant, MD  diazepam (VALIUM) 5 MG tablet Take 1 tablet (5 mg total) by mouth every 6 (six) hours as needed for muscle spasms. 12/11/23   Serena Croissant, MD  magnesium oxide (MAG-OX) 400 (240 Mg) MG tablet Take 1 tablet (400 mg total) by mouth 2 (two) times daily. 12/10/23   Edsel Petrin, DO  metroNIDAZOLE (METROGEL) 0.75 % gel Apply topically 2 (two) times daily. 11/14/23   Serena Croissant, MD  Metronidazole Benzoate POWD 1 Dose by Does not apply route daily. 12/10/23   Edsel Petrin, DO  mirtazapine (REMERON) 15 MG tablet Take 1 tablet (15 mg total) by mouth at bedtime. 12/10/23 01/09/24  Lanae Boast, MD  Multiple Vitamins-Minerals (MULTIVITAMIN WITH MINERALS) tablet FLORADIX Supplement-Iron +minerals 10/16/21   Anderson Malta L, DO  ondansetron (ZOFRAN) 8 MG tablet Take 1 tablet (8 mg total) by mouth every 8 (eight) hours as needed for nausea or vomiting. 11/14/23   Rachel Moulds, MD  oxyCODONE ER [facility] ER) 13.5 MG C12A Take 1 capsule by mouth every 8 (eight) hours. 12/10/23   Edsel Petrin, DO  Oxycodone HCl 10 MG TABS Take 1 tablet (10 mg total) by mouth every 3 (three) hours as needed ((score 7 to 10)). 11/11/23   Pickenpack-Cousar, Arty Baumgartner, NP  polyethylene glycol (MIRALAX / GLYCOLAX) 17 g packet Take 17 g by mouth daily. 10/16/21   Edsel Petrin, DO  potassium chloride SA (KLOR-CON M) 20 MEQ tablet Take 1 tablet by mouth daily. 12/12/23   Serena Croissant, MD  prochlorperazine (COMPAZINE) 10 MG tablet  Take 1 tablet (10 mg total) by mouth every 6 (six) hours as needed for nausea or vomiting. 11/14/23   Rachel Moulds, MD  Silver-Carboxymethylcellulose (AQUACEL AG ADVANTAGE) 4"X5" PADS Apply 1 Pad topically in the morning and at bedtime. 07/03/23   Serena Croissant, MD  Vitamin D, Ergocalciferol, (DRISDOL) 1.25 MG (50000 UNIT) CAPS capsule Take 1 capsule (50,000 Units total) by mouth every 7 (seven) days. 12/10/23   Edsel Petrin, DO    Physical Exam: BP (!) 158/99   Pulse (!) 132   Temp 98.3 F (36.8 C) (Oral)   Resp 20   Ht 5\' 5"  (1.651 m)   Wt 54.4 kg   SpO2 98%   BMI 19.97 kg/m  General: Pleasant, frail and acutely ill middle-age woman laying in bed. No acute distress. HEENT: [place]/AT. Anicteric sclera.  Dry  mucous membrane. Chest wall: Large anterior chest wall mass and breast wound covered with dressing. Mild ttp.  CV: Tachycardic. Regular rhythm. No murmurs, rubs, or gallops. No LE edema Pulmonary: Mild tachypnea. Anterior lung sounds to auscultation. Normal effort. Decreased air movement. No wheezing or rales. Abdominal: Soft, nontender, nondistended. Normal bowel sounds. Extremities: Palpable radial and DP pulses. Normal ROM. Skin: Warm and dry. No obvious rash or lesions. Decreased skin turgor. Neuro: A&Ox3. Moves all extremities. Normal sensation to light touch. No focal deficit. Psych: Normal mood and affect          Labs on Admission:  Basic Metabolic Panel: Recent Labs  Lab 12/30/23 1108  NA 140  K 4.1  CL 108  CO2 26  GLUCOSE 93  BUN 9  CREATININE 0.92  CALCIUM 9.1   Liver Function Tests: Recent Labs  Lab 12/30/23 1108  AST 69*  ALT 31  ALKPHOS 158*  BILITOT 0.3  PROT 7.3  ALBUMIN 3.3*   No results for input(s): "LIPASE", "AMYLASE" in the last 168 hours. No results for input(s): "AMMONIA" in the last 168 hours. CBC: Recent Labs  Lab 12/30/23 1108 12/30/23 1257  WBC 11.3* 11.7*  NEUTROABS 8.6*  --   HGB 10.8* 10.9*  HCT 34.9* 35.3*  MCV  86.2 88.3  PLT 531* 509*   Cardiac Enzymes: No results for input(s): "CKTOTAL", "CKMB", "CKMBINDEX", "TROPONINI" in the last 168 hours. BNP (last 3 results) No results for input(s): "BNP" in the last 8760 hours.  ProBNP (last 3 results) No results for input(s): "PROBNP" in the last 8760 hours.  CBG: No results for input(s): "GLUCAP" in the last 168 hours.  Radiological Exams on Admission: No results found. Assessment/Plan Misty Arnold is a 53 y.o. female with medical history significant for metastatic right breast cancer (s/p mastectomy, palliative radiation/chemotherapy, currently on Keytruda), chronic fungating right chest wall mass, spinal metastases (s/p T7-T10 decompressive laminectomy), chronic cancer associated pain, depression/anxiety who was sent directly from the cancer center to the ED for evaluation of uncontrolled cancer associated pain.    # Cancer associated pain, intractable # Metastatic right breast cancer Patient with history of metastatic right breast cancer presented from her cancer center for management of her intractable chest and back pain secondary to her metastatic disease. This is her second hospitalization the last 4 weeks. Her oncologist does not recommend any further systemic treatments due to her worsening performance status and lack of response to treatment. Recommend discontinuation of immunotherapy with Keytruda. Patient tachycardic and dehydrated but in no significant distress. -Oncology following, appreciate recs -Palliative care consulted, will see in the a.m. -Resume home Celebrex 200 mg daily at bedtime -OxyContin 15 mg twice daily (substitute for home Xtampza ER) -Oxycodone 10 mg every 3 hours as needed for severe pain -IV Dilaudid 1 mg every 4 hours as needed for breakthrough pain -Bowel regimen with daily Senokot-S and PRN MiraLAX  # Chronic metastatic right chest wall wound Patient does her own wound care at home with dressing changes twice  a day. -Wound care consulted, appreciate recs -Continue twice daily dressing changes with MetroGel  # HTN BP slightly elevated with SBP in the 130s to 150s in the setting of uncontrollable pain. -Continue atenolol  # Anxiety and depression -Continue mirtazapine 15 mg at bedtime  # Generalized deconditioning In the setting of intractable cancer pain and decreased p.o. intake. Dehydrated on exam. Status post IV LR 1 L bolus in the ED. -Start IV NS 100 cc/h -Boost 3 times  daily between meals -PT/OT eval and treat   DVT prophylaxis: Xarelto    Code Status: Full Code  Consults called: Oncology, palliative care  Family Communication: No family at bedside  Severity of Illness: The appropriate patient status for this patient is INPATIENT. Inpatient status is judged to be reasonable and necessary in order to provide the required intensity of service to ensure the patient's safety. The patient's presenting symptoms, physical exam findings, and initial radiographic and laboratory data in the context of their chronic comorbidities is felt to place them at high risk for further clinical deterioration. Furthermore, it is not anticipated that the patient will be medically stable for discharge from the hospital within 2 midnights of admission.   * I certify that at the point of admission it is my clinical judgment that the patient will require inpatient hospital care spanning beyond 2 midnights from the point of admission due to high intensity of service, high risk for further deterioration and high frequency of surveillance required.*  Level of care:  Palliative care floor  Steffanie Rainwater, MD 12/30/2023, 3:41 PM [facility] Pager: [phone] Isaiah 41:10   If 7PM-7AM, please contact night-coverage [email or web address] Password [email or web address]

## 2023-12-30 NOTE — Progress Notes (Signed)
 Pt transported to ED at 1205 via w/c for intractable pain. MD ordered Dilaudid 1 mg IM for pt but this was cancelled d/t pt's status of being drowsy. Report given to [place], Charity fundraiser

## 2023-12-30 NOTE — Progress Notes (Signed)
 Patient Care Team: Serena Croissant, MD as PCP - General (Hematology and Oncology)  DIAGNOSIS:  Encounter Diagnosis  Name Primary?   Malignant neoplasm of upper-outer quadrant of right breast in female, estrogen receptor positive (HCC) Yes    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; patient went to cancer treatment centers of [place] for second opinion and delayed therapy   01/26/2011 Surgery   right mastectomy followed by reconstruction: Invasive ductal carcinoma T1 N1 MIC M0 stage IB ER/PR positive HER-2 negative, BRCA negative, Oncotype DX low risk   05/29/2012 Procedure   right chest wall nodule excision done on 06/24/2012 showed metastatic carcinoma margins were positive, but CT scan no metastatic disease, recommended chemotherapy but patient refused also refused reexcision   04/28/2013 Treatment Plan Change   patient went to [place] for alternative treatments and took herbal medications, tonics etc. But she could not afford these trips.   01/08/2014 Breast MRI   right breast multiple enhancing masses within the soft tissues largest 5.6 cm in wall skin surface and the capsule of the silicone prosthesis, contiguous nodules involving in inferomedial breast and tired and subcentimeter nodules across the midline   11/02/2014 Cancer Staging   Staging form: Breast, AJCC 7th Edition - Clinical: Stage IV (T4b, N0, M1) - Signed by Serena Croissant, MD on 12/18/2022   08/29/2015 Surgery   Right mastectomy: IDC with invol of skin and skin ulceration, breast capsule inv cancer, superior medial margin positive, 1/1 LN positive, grade 3, 14.3 cm, 3.5 cm, ALI, chest wall involv, ER  90-100%, PR 80-90%, HER-2 negative, Ki 67 40-50% T4CN1 (St 3B)   03/05/2016 -  Anti-estrogen oral therapy   Tamoxifen 20 mg daily stopped due to headache and uncontrolled hypertension. Decrease to 10 mg daily 04/03/2016, stopped June 2017 and took an estrogen metabolizer over-the-counter; started Aromasin April 2018 from a [place] physician, Zoladex started on 05/2017 and switched to Letrozole in 09/2017   01/04/2020 Surgery   Patient presented to the ED on 01/04/20 for worsening lower extremity numbness and underwent a decompressive laminectomy with tumor resection at T9 that was complicated with acute blood loss anemia and leukocytosis.   01/28/2020 - 02/15/2020 Radiation Therapy   Palliative radiation at site of thoracic tumor resection   03/03/2020 Miscellaneous    Ibrance with letrozole and Zoladex    Miscellaneous   Guardant 360: ESR 1 mutations, PI K3 CA mutation, EGFR mutation, T p53 mutation   10/17/2021 - 06/06/2022 Chemotherapy   Patient is on Treatment Plan : BREAST METASTATIC fam-trastuzumab deruxtecan-nxki (Enhertu) q21d     10/17/2021 - 03/12/2023 Chemotherapy   Patient is on Treatment Plan : BREAST METASTATIC Fam-Trastuzumab Deruxtecan-nxki (Enhertu) (5.4) q21d     11/14/2023 -  Chemotherapy   Patient is on Treatment Plan : BREAST Pembrolizumab (200) q21d x 24 months     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)  01/04/2020 Initial Diagnosis   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)   10/17/2021 - 06/06/2022 Chemotherapy   Patient is on Treatment Plan : BREAST METASTATIC fam-trastuzumab deruxtecan-nxki (Enhertu) q21d     10/17/2021 - 03/12/2023 Chemotherapy   Patient is on Treatment Plan : BREAST METASTATIC Fam-Trastuzumab Deruxtecan-nxki (Enhertu) (  5.4) q21d     11/14/2023 -  Chemotherapy   Patient is on Treatment Plan : BREAST Pembrolizumab (200) q21d x 24 months     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)  01/08/2020 Initial Diagnosis   Metastatic breast  cancer (HCC)   10/17/2021 - 06/06/2022 Chemotherapy   Patient is on Treatment Plan : BREAST METASTATIC fam-trastuzumab deruxtecan-nxki (Enhertu) q21d     10/17/2021 - 03/12/2023 Chemotherapy   Patient is on Treatment Plan : BREAST METASTATIC Fam-Trastuzumab Deruxtecan-nxki (Enhertu) (5.4) q21d     11/14/2023 -  Chemotherapy   Patient is on Treatment Plan : BREAST Pembrolizumab (200) q21d x 24 months       CHIEF COMPLIANT: Intractable pain in the chest and back  HISTORY OF PRESENT ILLNESS:   History of Present Illness The patient presents with severe pain, rated as a ten out of ten. The pain is located in the breast and back. The intensity of the pain is such that the patient is unable to remain at home and requires hospitalization.  She has ran out of her pain medication because the pain has markedly gotten worse over the past 2 weeks.     ALLERGIES:  is allergic to amoxicillin, penicillins, pork-derived products, penicillin g, tamoxifen, and tramadol.  MEDICATIONS:  Current Outpatient Medications  Medication Sig Dispense Refill   atenolol (TENORMIN) 50 MG tablet Take 1 tablet (50 mg total) by mouth 2 (two) times daily. 180 tablet 3   celecoxib (CELEBREX) 200 MG capsule Take 1 capsule (200 mg total) by mouth at bedtime. 30 capsule 6   dexamethasone (DECADRON) 1 MG tablet Take 2 tablets (2 mg total) by mouth daily. 30 tablet 3   diazepam (VALIUM) 5 MG tablet Take 1 tablet (5 mg total) by mouth every 6 (six) hours as needed for muscle spasms. 60 tablet 3   magnesium oxide (MAG-OX) 400 (240 Mg) MG tablet Take 1 tablet (400 mg total) by mouth 2 (two) times daily. 60 tablet 6   metroNIDAZOLE (METROGEL) 0.75 % gel Apply topically 2 (two) times daily. 135 g 6   Metronidazole Benzoate POWD 1 Dose by Does not apply route daily. 1 g 3   mirtazapine (REMERON) 15 MG tablet Take 1 tablet (15 mg total) by mouth at bedtime. 30 tablet 0   Multiple Vitamins-Minerals (MULTIVITAMIN WITH MINERALS) tablet  FLORADIX Supplement-Iron +minerals     ondansetron (ZOFRAN) 8 MG tablet Take 1 tablet (8 mg total) by mouth every 8 (eight) hours as needed for nausea or vomiting. 30 tablet 1   oxyCODONE ER (XTAMPZA ER) 13.5 MG C12A Take 1 capsule by mouth every 8 (eight) hours. 90 capsule 0   Oxycodone HCl 10 MG TABS Take 1 tablet (10 mg total) by mouth every 3 (three) hours as needed ((score 7 to 10)). 180 tablet 0   polyethylene glycol (MIRALAX / GLYCOLAX) 17 g packet Take 17 g by mouth daily. 30 each 0   potassium chloride SA (KLOR-CON M) 20 MEQ tablet Take 1 tablet by mouth daily. 30 tablet 6   prochlorperazine (COMPAZINE) 10 MG tablet Take 1 tablet (10 mg total) by mouth every 6 (six) hours as needed for nausea or vomiting. 30 tablet 1   Silver-Carboxymethylcellulose (AQUACEL AG ADVANTAGE) 4"X5" PADS Apply 1 Pad topically in the morning and at bedtime. 60 each 3   Vitamin D, Ergocalciferol, (DRISDOL) 1.25 MG (50000 UNIT) CAPS capsule Take 1 capsule (50,000 Units total) by mouth every 7 (seven) days. 12 capsule  0   No current facility-administered medications for this visit.    PHYSICAL EXAMINATION: ECOG PERFORMANCE STATUS: 1 - Symptomatic but completely ambulatory  Vitals:   12/30/23 1138  BP: 125/84  Pulse: (!) 129  Resp: 20  Temp: 98.3 F (36.8 C)  SpO2: 96%   Filed Weights   12/30/23 1138  Weight: 120 lb 8 oz (54.7 kg)      LABORATORY DATA:  I have reviewed the data as listed    Latest Ref Rng & Units 12/12/2023   11:48 AM 12/09/2023   10:30 AM 12/07/2023    7:24 AM  CMP  Glucose 70 - 99 mg/dL 82  161  92   BUN 6 - 20 mg/dL 12  20  14    Creatinine 0.44 - 1.00 mg/dL 0.96  0.45  4.09   Sodium 135 - 145 mmol/L 142  140  140   Potassium 3.5 - 5.1 mmol/L 3.7  3.3  4.0   Chloride 98 - 111 mmol/L 108  110  103   CO2 22 - 32 mmol/L 27  22  28    Calcium 8.9 - 10.3 mg/dL 8.4  8.1  8.8   Total Protein 6.5 - 8.1 g/dL 7.0     Total Bilirubin 0.0 - 1.2 mg/dL 0.2     Alkaline Phos 38 - 126 U/L  119     AST 15 - 41 U/L 60     ALT 0 - 44 U/L 40       Lab Results  Component Value Date   WBC 11.3 (H) 12/30/2023   HGB 10.8 (L) 12/30/2023   HCT 34.9 (L) 12/30/2023   MCV 86.2 12/30/2023   PLT 531 (H) 12/30/2023   NEUTROABS 8.6 (H) 12/30/2023    ASSESSMENT & PLAN:  Breast cancer of upper-outer quadrant of right female breast (HCC) Recurrent right breast cancer initially DCIS treated with mastectomy followed by reconstruction and later developed chest wall recurrence in 2013 ER/PR positive HER-2 negative, patient underwent alternative therapies in [place] but could not afford the trips and hence she has not been on any breast cancer therapy for a long time.   Treatment summary: 1. Right mastectomy 08/29/2015 at [facility] (Dr.Singh): IDC with invol of skin and skin ulceration, breast capsule inv cancer, superior medial margin positive, 1/1 LN positive, grade 3, 14.3 cm, 3.5 cm, ALI, chest wall involv, ER 90-100%, PR 80-90%, HER-2 negative, Ki 67 40-50% T4CN1 (St 3B) 2. Patient started tamoxifen but developed severe headache with severe hypertension and tamoxifen was discontinued 03/17/2016.  3. 02/2017 started on Exemestane, Zoladex started 05/29/2017 4.  Hospitalization for paraplegia: T9 vertebral body compression status post decompressive laminectomy and tumor resection 01/04/2020 5.  Palliative radiation to the thoracic spine 01/28/2020 6.  Ibrance with letrozole started April 2021 discontinued 07/24/2021 7.  Enhertu started 10/17/2021 every 3 weeks discontinued May 2024 due to progression 8.  Elacestrant 04/10/2023-09/19/2023 ----------------------------------------------------------------------------------------------------------------- CT chest 03/10/2023: Progressive osseous and chest wall metastasis (sternal mass 6.7 cm previously 6.2, manubrium 2.4 cm previously 1.5 cm, right posterior ninth rib 4.2 cm was previously 3.6 cm, right chest wall mass 6.4 cm was previously 6.1 cm, adjacent 4.6 cm  chest wall mass previously 4.1 cm   Treatment plan:  Toxicities: Fatigue Vertigo: Brain MRI negative Wound care issues: Patient has home health.  I sent prescription for her supplies.   CT CAP 09/13/2023: Significant progression of disease.  Increase in the chest wall masses in size and number (6.7 cm 4.7 cm,  8.9 cm was 2.5 x 4.2 cm , 7.6 cm used to be 4.8 cm, 6.3 cm was 4.5 cm), increasing right pleural effusion increasing prevascular lymph node and bilateral axillary lymph nodes, right supraclavicular and right hemipelvis.  Increasing edema in the posterior mediastinum increased size of liver metastases) 3.9 cm used to be 2.1 cm)   Guardant360 09/29/2023: ESR 1 mutation, T p53, FGF R1 amplification, GA TA 3, ATRX splice site SNV were positive.  TMB: 11.39, MSI high not detected   Treatment plan: Immunotherapy with Keytruda (TMB high) started 11/14/2023, today cycle 3 Hospitalization 12/04/2023-12/10/2023: Pain control, depression/anxiety: Palliative care following   ------------------------------------- Assessment and Plan Assessment & Plan Severe Pain Patient reports severe pain (10/10) in the breast and back. Pain is uncontrolled at home.  She took her last tablet of oxycodone.  She is in tears in the clinic with spasms of pain. -Plan for hospital admission for pain management.   CODE STATUS: DNR I do not recommend any further breast cancer systemic treatments because of her worsening performance status and worsening disease. I recommend discontinuation of immunotherapy.  Preferably patient will need hospice when she gets discharged from the hospital.    No orders of the defined types were placed in this encounter.  The patient has a good understanding of the overall plan. she agrees with it. she will call with any problems that may develop before the next visit here. Total time spent: 30 mins including face to face time and time spent for planning, charting and co-ordination of  care   Tamsen Meek, MD 12/30/23

## 2023-12-30 NOTE — Progress Notes (Signed)
 Patient was scheduled for nutrition follow up however, treatment was cancelled and patient taken to ED.

## 2023-12-31 DIAGNOSIS — C50919 Malignant neoplasm of unspecified site of unspecified female breast: Principal | ICD-10-CM

## 2023-12-31 DIAGNOSIS — R531 Weakness: Secondary | ICD-10-CM

## 2023-12-31 DIAGNOSIS — G893 Neoplasm related pain (acute) (chronic): Secondary | ICD-10-CM | POA: Diagnosis not present

## 2023-12-31 DIAGNOSIS — C7951 Secondary malignant neoplasm of bone: Secondary | ICD-10-CM

## 2023-12-31 DIAGNOSIS — R627 Adult failure to thrive: Secondary | ICD-10-CM

## 2023-12-31 DIAGNOSIS — M549 Dorsalgia, unspecified: Secondary | ICD-10-CM

## 2023-12-31 LAB — COMPREHENSIVE METABOLIC PANEL
ALT: 31 U/L (ref 0–44)
AST: 64 U/L — ABNORMAL HIGH (ref 15–41)
Albumin: 2.8 g/dL — ABNORMAL LOW (ref 3.5–5.0)
Alkaline Phosphatase: 129 U/L — ABNORMAL HIGH (ref 38–126)
Anion gap: 12 (ref 5–15)
BUN: 10 mg/dL (ref 6–20)
CO2: 22 mmol/L (ref 22–32)
Calcium: 9.1 mg/dL (ref 8.9–10.3)
Chloride: 108 mmol/L (ref 98–111)
Creatinine, Ser: 0.87 mg/dL (ref 0.44–1.00)
GFR, Estimated: >60 mL/min (ref >60)
Glucose, Bld: 78 mg/dL (ref 70–99)
Potassium: 4.1 mmol/L (ref 3.5–5.1)
Sodium: 142 mmol/L (ref 135–145)
Total Bilirubin: 0.5 mg/dL (ref 0.0–1.2)
Total Protein: 7 g/dL (ref 6.5–8.1)

## 2023-12-31 LAB — T4: T4, Total: 12.2 ug/dL — ABNORMAL HIGH (ref 4.5–12.0)

## 2023-12-31 MED ORDER — ENOXAPARIN SODIUM 40 MG/0.4ML IJ SOSY
40.0000 mg | PREFILLED_SYRINGE | INTRAMUSCULAR | Status: DC
  Administered 2024-01-01 – 2024-01-18 (×18): 40 mg via SUBCUTANEOUS
  Filled 2023-12-31 (×18): qty 0.4

## 2023-12-31 MED ORDER — DEXAMETHASONE 4 MG PO TABS
2.0000 mg | ORAL_TABLET | Freq: Every day | ORAL | Status: DC
  Administered 2023-12-31 – 2024-01-01 (×2): 2 mg via ORAL
  Filled 2023-12-31 (×2): qty 1

## 2023-12-31 MED ORDER — HYDROMORPHONE HCL 1 MG/ML IJ SOLN
1.0000 mg | INTRAMUSCULAR | Status: DC | PRN
  Administered 2023-12-31 – 2024-01-03 (×18): 1 mg via INTRAVENOUS
  Filled 2023-12-31 (×20): qty 1

## 2023-12-31 MED ORDER — OXYCODONE HCL ER 15 MG PO T12A
15.0000 mg | EXTENDED_RELEASE_TABLET | Freq: Three times a day (TID) | ORAL | Status: DC
  Administered 2023-12-31 – 2024-01-02 (×5): 15 mg via ORAL
  Filled 2023-12-31 (×5): qty 1

## 2023-12-31 MED ORDER — SENNOSIDES-DOCUSATE SODIUM 8.6-50 MG PO TABS
1.0000 | ORAL_TABLET | Freq: Two times a day (BID) | ORAL | Status: DC
  Administered 2023-12-31 – 2024-02-17 (×92): 1 via ORAL
  Filled 2023-12-31 (×95): qty 1

## 2023-12-31 MED ORDER — POLYETHYLENE GLYCOL 3350 17 G PO PACK
17.0000 g | PACK | Freq: Every day | ORAL | Status: DC
  Administered 2023-12-31 – 2024-01-03 (×4): 17 g via ORAL
  Filled 2023-12-31 (×4): qty 1

## 2023-12-31 NOTE — Progress Notes (Signed)
 OT Cancellation Note  Patient Details Name: Misty Arnold MRN: [id number] DOB: 1970/12/07   Cancelled Treatment:    Reason Eval/Treat Not Completed: Other (comment). OT orders received 12/31/23 7:00 a.m, then discontinued by MD at 7:39 a.m. OT will sign off  Galen Manila 12/31/2023, 10:51 AM

## 2023-12-31 NOTE — Consult Note (Signed)
 Palliative Care Consultation Note  53 yo woman, well known to our palliative care oncology program for past 2+ years with metastatic breast cancer and malignant chest wall wound, admitted with cancer related pain, dyspnea and weakness.  Misty Arnold appears tired today and appears more tachypneic than usual. She was sent to the ED today because she was having severe pain and was unable to receive her treatment yesterday with Keytruda. Dr. Pamelia Hoit discussed hospice care with her but she is still in a treatment mindset today. She has significant socioeconomic stressors and lives in [place] with a significant other. She has family support but there is also anxiety and difficulty coping with those relationships in the context of her illness.   For today we will work on treating her pain and anxiety. I am concerned about her dyspnea and acutely worsening pain- will obtain a CT Chest to further assess contributors. Her white count is elevated, no change in her wound or purulent drainage.  Recommendations:  Increase IV Hydromorphone Schedule Oxycontin TID Resume Decadron Continue Celebrex Provide Wound care Obtain CT Chest-prognostication and for evaluation of pain and dyspnea. Will discuss goals of care after CT results return. I changed her to Lovenox for DVT prophylaxis instead of Xaralto-her chest wall has historically had difficult to control bleeding.  Anderson Malta, DO Palliative Medicine

## 2023-12-31 NOTE — Consult Note (Addendum)
 WOC Consult: Reason for Consult: consult requested for right breast fungating tumor with full thickness wounds to that location.  Performed remotely after review of progress notes in the EMR. Pt is familiar to [facility] team from recent admission on 1/31.  Large fungating tumor that covers the patient's entire left side; with depth and mod amt drainage and maceration to wound edges.  Dressing change orders for these types of wounds is directed towards minimizing pain with dressing changes and decreasing adherence to the wound which can cause bleeding. Topical treatment will not promote healing. Flagyl solution has already been ordered BID by the primary team. Pt states she is allergic to Xeroform and Vaseline dressings so these products should be avoided. She was also using Aquacel foam dressings, informed her we do not carry this product in the [facility] formulary and we will need to substitute regular foam dressings.    Topical treatment orders provided for bedside nurses to perform as follows:  Moisten previous dressing with NS to assist with removal, then pat dry BID. Apply non adhesive dressings to open areas after covering with crushed Flagyl solution applied and then ABD pads and cover with foam dressings (If desired, you may use mesh underwear with the crotch cut out to use as a tank top, instead of tape, to hold the dressing in place.) Please re-consult if further assistance is needed.  Thank-you,  Cammie Mcgee MSN, RN, CWOCN, [place], CNS [phone]

## 2023-12-31 NOTE — Plan of Care (Signed)

## 2023-12-31 NOTE — Progress Notes (Signed)
 PROGRESS NOTE  Misty Arnold  MVH:[id number] DOB: [birth year] DOA: 12/30/2023 PCP: Serena Croissant, MD   Brief Narrative: Patient is a 53 year old female with history of metastatic right breast cancer status postmastectomy, palliative radiation/chemotherapy currently on Keytruda, chronic fungating right chest wall mass, spinal mets status post T7-T10 decompressive laminectomy, cancer surgery pain, depression, anxiety who was requested from cancer center for direct admission for the evaluation of uncontrolled cancer associated pain.  Oncology recommended hospitalization for pain management and palliative care consultation.  On presentation, she was in sinus tachycardia.  EKG showed: QTc of 515.  Patient was started on IV pain medications.  Palliative care consulted.  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  Has diffuse metastatic disease.  Oncology does not recommend further treatment because of this progression, worsening performance status. Continue pain management, supportive care, palliative care consulted.  Continue bowel regimen.  Chronic metastatic right chest wall wound: Patient does her own wound care at home.  Dressing changed twice a day.  Wound care consulted.  Poor oral intake: On dexamethasone for appetite.  Nutritionist consulted  Hypertension: Continue atenolol  Anxiety/depression :on mirtazapine  Generalized deconditioning: Reported poor oral intake at home.  Continue gentle IV fluids.  Goals of care: Progressive metastatic breast cancer.  Very poor prognosis with poor quality of life.  Palliative care consulted for goals of care.  Patient  appropriate for hospice.  Patient lives with her family.         DVT prophylaxis:rivaroxaban (XARELTO) tablet 10 mg Start: 12/30/23 1800 rivaroxaban (XARELTO) tablet 10 mg     Code Status: Full Code  Family Communication: None at the bedside  Patient status:Inpatient  Patient is from :Home  Anticipated discharge to: Home  Estimated DC date: Awaiting palliative care consultation   Consultants: None  Procedures: None  Antimicrobials:  Anti-infectives (From admission, onward)    None       Subjective: Patient seen and examined at bedside today.  Hemodynamically stable.  Overall looks comfortable.  Complains of severe back pain, poor oral intake.  No nausea or vomiting or abdominal pain today.  Had bowel movement yesterday.  Objective: Vitals:   12/30/23 1708 12/30/23 2010 12/30/23 2353 12/31/23 0321  BP: (!) 138/96 134/87 118/83 124/85  Pulse: (!) 110 (!) 104 89 92  Resp: (!) 24 18 18 18   Temp: 99 F (37.2 C) 99.7 F (37.6 C) 97.8 F (36.6 C) (!) 97.4 F (36.3 C)  TempSrc: Oral Oral Oral Oral  SpO2: 93% 94% 97% 95%  Weight:      Height:        Intake/Output Summary (Last 24 hours) at 12/31/2023 0732 Last data filed at 12/31/2023 0346 Gross per 24 hour  Intake 1950.93 ml  Output --  Net 1950.93 ml   Filed Weights   12/30/23 1220  Weight: 54.4 kg    Examination:  General exam: Overall comfortable, not in distress, very deconditioned, chronically looking HEENT: PERRL Respiratory system:  no wheezes or crackles  Cardiovascular system: S1 & S2 heard, RRR.  Gastrointestinal system: Abdomen is nondistended, soft and nontender. Central nervous system: Alert and oriented Extremities: No edema, no clubbing ,no cyanosis Skin: Fungating  mass on the right breast   Data Reviewed: I have personally reviewed following labs and imaging studies  CBC: Recent Labs  Lab 12/30/23 1108 12/30/23 1257  WBC 11.3* 11.7*  NEUTROABS 8.6*  --   HGB 10.8* 10.9*   HCT 34.9* 35.3*  MCV 86.2 88.3  PLT 531* 509*   Basic Metabolic Panel: Recent Labs  Lab 12/30/23 1108 12/31/23 0557  NA 140 142  K 4.1 4.1  CL 108 108  CO2 26 22  GLUCOSE 93 78  BUN 9 10  CREATININE 0.92 0.87  CALCIUM 9.1 9.1     No results found for this or any previous visit (from the past 240 hours).   Radiology Studies: No results found.  Scheduled Meds:  atenolol  50 mg Oral BID   celecoxib  200 mg Oral QHS   feeding supplement  1 Container Oral TID BM   metroNIDAZOLE   Topical BID   mirtazapine  15 mg Oral QHS   multivitamins with iron  1 tablet Oral Daily   oxyCODONE  15 mg Oral Q12H   rivaroxaban  10 mg Oral Daily   senna-docusate  1 tablet Oral QHS   Continuous Infusions:  sodium chloride 100 mL/hr at 12/31/23 0346     LOS: 1 day   Burnadette Pop, MD [facility] P2/25/2025, 7:32 AM

## 2023-12-31 NOTE — Progress Notes (Addendum)
 Initial Nutrition Assessment  DOCUMENTATION CODES:  Non-severe (moderate) malnutrition in context of chronic illness (metastatic breast cancer)  INTERVENTION:  Boost Breeze po TID, each supplement provides 250 kcal and 9 grams of protein  Continued multivitamin intake daily  Ordered snacks TID in between meals to help meet calorie and protein needs and encouraged small, frequent meals  Continued intake of meals to meet calorie and protein needs  NUTRITION DIAGNOSIS:  Moderate Malnutrition related to chronic illness (cancer and cancer related treatments) as evidenced by energy intake < 75% for > or equal to 1 month, moderate muscle depletion, moderate fat depletion, percent weight loss.  GOAL:  Patient will meet greater than or equal to 90% of their needs  MONITOR:  PO intake, Supplement acceptance, Weight trends  REASON FOR ASSESSMENT:  Consult Assessment of nutrition requirement/status  ASSESSMENT:  Pt with hx pf right breast metastatic cancer (s/p mastectomy, palliative radiation/chemotherapy, on Keytruda), spinal metastases, chest wall mass, chronic pain, depression/anxiety. Pt admitted for worsening back and breast pain and recommendation of oncologist for hospital admission for pain management.  Consult for palliative care 2/24 and continued pain management.  Spoke with pt who was awake and alert. Pt reports good appetite currently and reports she was very weak in the ED so she was trying to make up her calories today. Pt reports appetite has been good due to medication. Pt reported eating most of her breakfast this morning which consisted of applesauce, yogurt, and coffee. She also ate the majority of her lunch consisting of [place], sweet potatoes, and green beans. Pt had also ordered a vegetable tray with hummus and pita for a snack. Pt reports drinking cranberry juice, water, and Boost Breeze throughout the day to help with hydration. She reports she tolerates clear liquid ONS  better than milk-based supplements.  PTA, pt reported having good days and bad days in regards to pain and appetite. On days with a lot of pain, pt reports only tolerating fluids or small bites of foods. On those days, she reports drinking a green powder mixed with water which contains vitamins and minerals to help with intake. On the good days, she reports eating all day to make up for the calories and protein missed on bad days. She reports eating all types of foods but typically limits herself to red meat just once a week due to irritation of the stomach.   Pt reports typical weight of 135# prior to progression of cancer around 08/2023 and pt reported the RN weighed her today at 116#. Per chart review, oncology reported weight as 128# 08/2023. 12# weight loss in 4 months is 10% which is clinically significant for moderate malnutrition.   PTA, pt reports she had been doing PT to help mobility and was able to walk independently without assistance of wheelchair, walker, or cane. Pt reported needing wheelchair in ED due to weakness since she had not been eating.  Discussed continued intake of Boost Breeze to meet calorie and protein needs. Discussed continued intake of meals and ordering snacks to help with meeting calorie and protein needs.  Medications reviewed and include: celecoxib, dexamethasone, lovenox, mirtazapine, oxycodone, senokot Boost Breeze TID, Multivitamin with minerals  Labs reviewed: Hgb 10.9  NUTRITION - FOCUSED PHYSICAL EXAM:  Flowsheet Row Most Recent Value  Orbital Region Moderate depletion  Upper Arm Region Moderate depletion  Thoracic and Lumbar Region Unable to assess  [pain in back and chest]  Buccal Region Mild depletion  Temple Region Moderate depletion  Clavicle  Bone Region Unable to assess  [pain in back and chest]  Clavicle and Acromion Bone Region Unable to assess  [pain in back and chest]  Scapular Bone Region Unable to assess  [pain in back and chest]   Dorsal Hand Moderate depletion  Patellar Region Moderate depletion  [R side more depleted than L d/t increased weakness in that leg]  Anterior Thigh Region Moderate depletion  [R side more depleted than L d/t increased weakness in that leg]  Posterior Calf Region Moderate depletion  [R side more depleted than L d/t increased weakness in that leg]  Edema (RD Assessment) None  Hair Reviewed  Eyes Reviewed  Mouth Reviewed  Skin Reviewed  Nails Reviewed   Diet Order:   Diet Order             Diet regular Room service appropriate? Yes; Fluid consistency: Thin  Diet effective now                  EDUCATION NEEDS:  Education needs have been addressed  Skin:  Skin Assessment: Reviewed RN Assessment (R chest wound and tumor)  Last BM:  per chart review, last bm 2/23  Height:  Ht Readings from Last 1 Encounters:  12/30/23 5\' 5"  (1.651 m)   Weight:  Wt Readings from Last 1 Encounters:  12/30/23 54.4 kg   Ideal Body Weight:  56.8 kg  BMI:  Body mass index is 19.97 kg/m.  Estimated Nutritional Needs:   Kcal:  1600-1800  Protein:  80-95g  Fluid:  >/= 2L  Louis Meckel Dietetic Intern

## 2023-12-31 NOTE — Progress Notes (Signed)
 Brief Pharmacy Note  Pharmacy consulted to review pork allergy as preference for use of Lovenox for VTE prophylaxis given high risk of bleeding from chest wall wound historically. Patient has been receiving Xarelto 10 mg daily for prophylaxis. Note pork allergy documented 01/09/20, patient received Lovenox starting 01/12/20. She received several other times in 2022 as well.  Per Dr. Phillips Odor, will change Xarelto to Lovenox. Start Lovenox 40 mg subcu daily starting 2/26.  Pricilla Riffle, PharmD, BCPS Clinical Pharmacist 12/31/2023 2:52 PM

## 2024-01-01 ENCOUNTER — Inpatient Hospital Stay (HOSPITAL_COMMUNITY): Payer: Medicare PPO

## 2024-01-01 DIAGNOSIS — R0789 Other chest pain: Secondary | ICD-10-CM | POA: Diagnosis not present

## 2024-01-01 DIAGNOSIS — G893 Neoplasm related pain (acute) (chronic): Secondary | ICD-10-CM | POA: Diagnosis not present

## 2024-01-01 DIAGNOSIS — C50911 Malignant neoplasm of unspecified site of right female breast: Secondary | ICD-10-CM | POA: Diagnosis not present

## 2024-01-01 DIAGNOSIS — C787 Secondary malignant neoplasm of liver and intrahepatic bile duct: Secondary | ICD-10-CM | POA: Diagnosis not present

## 2024-01-01 DIAGNOSIS — J9811 Atelectasis: Secondary | ICD-10-CM | POA: Diagnosis not present

## 2024-01-01 DIAGNOSIS — J90 Pleural effusion, not elsewhere classified: Secondary | ICD-10-CM | POA: Diagnosis not present

## 2024-01-01 DIAGNOSIS — Z48813 Encounter for surgical aftercare following surgery on the respiratory system: Secondary | ICD-10-CM | POA: Diagnosis not present

## 2024-01-01 LAB — CBC
HCT: 34.2 % — ABNORMAL LOW (ref 36.0–46.0)
Hemoglobin: 10.1 g/dL — ABNORMAL LOW (ref 12.0–15.0)
MCH: 26.8 pg (ref 26.0–34.0)
MCHC: 29.5 g/dL — ABNORMAL LOW (ref 30.0–36.0)
MCV: 90.7 fL (ref 80.0–100.0)
Platelets: 459 10*3/uL — ABNORMAL HIGH (ref 150–400)
RBC: 3.77 MIL/uL — ABNORMAL LOW (ref 3.87–5.11)
RDW: 17.1 % — ABNORMAL HIGH (ref 11.5–15.5)
WBC: 9.3 10*3/uL (ref 4.0–10.5)
nRBC: 0 % (ref 0.0–0.2)

## 2024-01-01 LAB — BASIC METABOLIC PANEL
Anion gap: 9 (ref 5–15)
BUN: 12 mg/dL (ref 6–20)
CO2: 23 mmol/L (ref 22–32)
Calcium: 8.6 mg/dL — ABNORMAL LOW (ref 8.9–10.3)
Chloride: 106 mmol/L (ref 98–111)
Creatinine, Ser: 0.88 mg/dL (ref 0.44–1.00)
GFR, Estimated: >60 mL/min (ref >60)
Glucose, Bld: 108 mg/dL — ABNORMAL HIGH (ref 70–99)
Potassium: 4.2 mmol/L (ref 3.5–5.1)
Sodium: 138 mmol/L (ref 135–145)

## 2024-01-01 MED ORDER — LIDOCAINE HCL 1 % IJ SOLN
INTRAMUSCULAR | Status: AC
  Filled 2024-01-01: qty 20

## 2024-01-01 MED ORDER — ONDANSETRON HCL 4 MG/2ML IJ SOLN
4.0000 mg | Freq: Four times a day (QID) | INTRAMUSCULAR | Status: DC | PRN
  Administered 2024-01-01 – 2024-01-03 (×3): 4 mg via INTRAVENOUS
  Filled 2024-01-01 (×3): qty 2

## 2024-01-01 MED ORDER — IOHEXOL 300 MG/ML  SOLN
75.0000 mL | Freq: Once | INTRAMUSCULAR | Status: AC | PRN
  Administered 2024-01-01: 75 mL via INTRAVENOUS

## 2024-01-01 MED ORDER — DEXAMETHASONE SODIUM PHOSPHATE 4 MG/ML IJ SOLN
4.0000 mg | Freq: Two times a day (BID) | INTRAMUSCULAR | Status: DC
  Administered 2024-01-01 – 2024-01-06 (×10): 4 mg via INTRAVENOUS
  Filled 2024-01-01 (×10): qty 1

## 2024-01-01 NOTE — Plan of Care (Signed)

## 2024-01-01 NOTE — Progress Notes (Signed)
   01/01/24 0951  TOC Brief Assessment  Insurance and Status Reviewed  Patient has primary care physician Yes  Home environment has been reviewed home  Prior/Current Home Services No current home services  Social Drivers of Health Review SDOH reviewed no interventions necessary  Readmission risk has been reviewed Yes  Transition of care needs transition of care needs identified, TOC will continue to follow

## 2024-01-01 NOTE — Progress Notes (Signed)
 PROGRESS NOTE  Misty Arnold  RUE:[id number] DOB: 07/20/1971 DOA: 12/30/2023 PCP: Serena Croissant, MD   Brief Narrative: Patient is a 53 year old female with history of metastatic right breast cancer status postmastectomy, palliative radiation/chemotherapy currently on Keytruda, chronic fungating right chest wall mass, spinal mets status post T7-T10 decompressive laminectomy, cancer surgery pain, depression, anxiety who was requested from cancer center for direct admission for the evaluation of uncontrolled cancer associated pain.  Oncology recommended hospitalization for pain management and palliative care consultation.  On presentation, she was in sinus tachycardia.  EKG showed: QTc of 515.  Patient was started on IV pain medications.  Palliative care consulted.  Ongoing goals of care.  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  Has diffuse metastatic disease.  Oncology does not recommend further treatment because of this progression, worsening performance status. Continue pain management, supportive care, palliative care consulted.  Continue bowel regimen. CT chest with contrast showed Interval progression of disease with increase in the size of right chest wall mass and right pleural effusion. Progression of compressive atelectasis of the right lung with mass effect and shift of the mediastinum to the left of the midline.  Will order thoracentesis for comfort/symptom alleviation  Chronic metastatic right chest wall wound: Patient does her own wound care at home.  Dressing changed twice a day.  Wound care consulted.  CT shows progression of  metastatic burden.  She is hospice appropriate.  Palliative care following and discussing goals of care.  Poor oral intake: On dexamethasone for appetite.  Nutritionist consulted  Hypertension: Continue atenolol  Anxiety/depression :on mirtazapine  Generalized deconditioning: Due to progression of her metastatic breast cancer.  Waiting for goal of care discussion.  If not hospice appropriate, will consult PT and OT  Goals of care: Progressive metastatic breast cancer.  Very poor prognosis with poor quality of life.  Palliative care consulted for goals of care.  Patient appropriate for hospice.  Patient lives with her family.    Nutrition Problem: Moderate Malnutrition Etiology: chronic illness (cancer and cancer related treatments)    DVT prophylaxis:enoxaparin (LOVENOX) injection 40 mg Start: 01/01/24 1000     Code Status: Full Code  Family Communication: None at the bedside  Patient status:Inpatient  Patient is from :Home  Anticipated discharge to: Home  Estimated DC date: Awaiting palliative care consultation/goals of care discussion.   Consultants: None  Procedures: None  Antimicrobials:  Anti-infectives (From admission, onward)    None       Subjective: Patient seen and examined at bedside today.  She looks more comfortable than yesterday.  Sitting in the chair.  She had a bowel movement yesterday.  On room air.  Denies any worsening shortness of breath or cough.  Still has significant pain on the right chest  Objective: Vitals:   12/31/23 1838 12/31/23 2100 12/31/23 2234 01/01/24 0535  BP:  119/72 119/72 117/77  Pulse:  100 100 84  Resp:  18  17  Temp: 98.1 F (36.7 C) 98.6 F (37 C)  98.1 F (36.7 C)  TempSrc: Oral Oral  Oral  SpO2:  99%  96%  Weight:      Height:  Intake/Output Summary (Last 24 hours) at 01/01/2024 1230 Last data filed at 12/31/2023 1300 Gross per 24 hour  Intake 480 ml  Output --  Net 480 ml   Filed Weights    12/30/23 1220  Weight: 54.4 kg    Examination:  General exam: Overall comfortable, not in distress, very deconditioned, chronically looking HEENT: PERRL Respiratory system:  no wheezes or crackles , diminished sounds on the right side Cardiovascular system: S1 & S2 heard, RRR.  Gastrointestinal system: Abdomen is nondistended, soft and nontender. Central nervous system: Alert and oriented Extremities: No edema, no clubbing ,no cyanosis Skin: Fungating mass in the right chest   Data Reviewed: I have personally reviewed following labs and imaging studies  CBC: Recent Labs  Lab 12/30/23 1108 12/30/23 1257 01/01/24 0606  WBC 11.3* 11.7* 9.3  NEUTROABS 8.6*  --   --   HGB 10.8* 10.9* 10.1*  HCT 34.9* 35.3* 34.2*  MCV 86.2 88.3 90.7  PLT 531* 509* 459*   Basic Metabolic Panel: Recent Labs  Lab 12/30/23 1108 12/31/23 0557 01/01/24 0606  NA 140 142 138  K 4.1 4.1 4.2  CL 108 108 106  CO2 26 22 23   GLUCOSE 93 78 108*  BUN 9 10 12   CREATININE 0.92 0.87 0.88  CALCIUM 9.1 9.1 8.6*     No results found for this or any previous visit (from the past 240 hours).   Radiology Studies: CT CHEST W CONTRAST Result Date: 01/01/2024 CLINICAL DATA:  Chest wall pain. No trauma. Known malignancy. History of breast cancer. EXAM: CT CHEST WITH CONTRAST TECHNIQUE: Multidetector CT imaging of the chest was performed during intravenous contrast administration. RADIATION DOSE REDUCTION: This exam was performed according to the departmental dose-optimization program which includes automated exposure control, adjustment of the mA and/or kV according to patient size and/or use of iterative reconstruction technique. CONTRAST:  75mL OMNIPAQUE IOHEXOL 300 MG/ML  SOLN COMPARISON:  Chest CT dated 11/22/2023. FINDINGS: Cardiovascular: There is no cardiomegaly. There is coronary vascular calcification. The thoracic aorta is unremarkable. 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 The origins of the great vessels of the aortic arch and the central pulmonary arteries appear patent as visualized. Mediastinum/Nodes: No obvious hilar adenopathy. Evaluation is however limited due to pleural effusion and consolidative changes of the lung. Mildly enlarged lymph nodes in the anterior mediastinum measure up to 13 mm in short axis. The esophagus is grossly unremarkable. Lungs/Pleura: Large right pleural effusion with interval increase in the size of the pleural effusion compared to prior CT. There is compressive atelectasis of the majority of the right lung progressed since the prior CT. There is associated mass effect and shift of the mediastinum to the left of the midline. No pneumothorax. The central airways are patent. Upper Abdomen: Hepatic metastatic disease. Musculoskeletal: Large mass involving the right breast extending into the skin and right chest wall. This mass measures approximately 7 x 14 cm in greatest axial dimensions. There is invasion into the intercostal and pleural space. There is destruction of the sternum with invasion of the mass into the anterior mediastinum. The structures changes of the anterolateral right fourth-seventh ribs. Additional mass in the right posterior chest wall with destructive changes of the posterior eighth and ninth ribs. Similar appearance of a lobulated mass in the left axilla measuring approximately 4 x 6 cm. Overall there is progression of disease since the prior CT. There is extension of mass into the central canal at T8-T9.  Lytic lesion involving the T8 and T9 as seen previously. Posterior spinal hardware again noted. IMPRESSION: 1. Interval progression of disease with increase in the size of right chest wall mass and right pleural effusion. Progression of compressive atelectasis of the right lung with mass effect and shift of the mediastinum to the left of the midline. 2. Hepatic metastatic disease. Electronically Signed   By: Elgie Collard M.D.   On: 01/01/2024 12:20    Scheduled Meds:  atenolol  50 mg Oral BID   celecoxib  200 mg Oral QHS   dexamethasone  2 mg Oral Daily   enoxaparin (LOVENOX) injection  40 mg Subcutaneous Q24H   feeding supplement  1 Container Oral TID BM   metroNIDAZOLE   Topical BID   mirtazapine  15 mg Oral QHS   multivitamins with iron  1 tablet Oral Daily   oxyCODONE  15 mg Oral Q8H   polyethylene glycol  17 g Oral Daily   senna-docusate  1 tablet Oral BID   Continuous Infusions:     LOS: 2 days   Burnadette Pop, MD [facility] P2/26/2025, 12:30 PM

## 2024-01-01 NOTE — Progress Notes (Signed)
 Palliative Care Consult Note  Patient is well known to palliative care service. Admitted with poorly controlled pain and weakness.

## 2024-01-01 NOTE — Procedures (Signed)
 PROCEDURE SUMMARY:  Successful US guided right thoracentesis. Yielded 1.4 L of dark yellow fluid. Patient tolerated procedure well. No immediate complications. EBL = trace   Post procedure chest X-ray reveals no pneumothorax  Loman Brooklyn PA-C 01/01/2024 3:20 PM

## 2024-01-01 NOTE — TOC Progression Note (Signed)
 Transition of Care [facility]) - Progression Note    Patient Details  Name: Misty Arnold MRN: [id number] Date of Birth: [birth year]  Transition of Care [facility]) CM/SW Contact  Diona Browner, LCSW Phone Number: 01/01/2024, 10:49 AM  Clinical Narrative:    CSW received call from [place] from [facility] requesting resumption of care orders for HHPT/RN prior to pt d/c. MD notified.         Expected Discharge Plan and Services                                               Social Determinants of Health (SDOH) Interventions SDOH Screenings   Food Insecurity: No Food Insecurity (12/30/2023)  Recent Concern: Food Insecurity - Food Insecurity Present (12/11/2023)  Housing: High Risk (12/30/2023)  Transportation Needs: Unmet Transportation Needs (12/30/2023)  Utilities: Not At Risk (12/30/2023)  Depression (PHQ2-9): Medium Risk (02/15/2020)  Social Connections: Moderately Integrated (12/30/2023)  Tobacco Use: Low Risk  (12/30/2023)    Readmission Risk Interventions    01/01/2024    9:51 AM 10/12/2021    3:40 PM  Readmission Risk Prevention Plan  Transportation Screening Complete Complete  PCP or Specialist Appt within 5-7 Days Complete   PCP or Specialist Appt within 3-5 Days  Complete  Home Care Screening Complete   Medication Review (RN CM) Complete   HRI or Home Care Consult  Complete  Social Work Consult for Recovery Care Planning/Counseling  Complete  Medication Review Oceanographer)  Complete

## 2024-01-02 DIAGNOSIS — C50919 Malignant neoplasm of unspecified site of unspecified female breast: Secondary | ICD-10-CM | POA: Diagnosis not present

## 2024-01-02 DIAGNOSIS — C799 Secondary malignant neoplasm of unspecified site: Secondary | ICD-10-CM | POA: Diagnosis not present

## 2024-01-02 DIAGNOSIS — G893 Neoplasm related pain (acute) (chronic): Secondary | ICD-10-CM | POA: Diagnosis not present

## 2024-01-02 LAB — VITAMIN D 25 HYDROXY (VIT D DEFICIENCY, FRACTURES): Vit D, 25-Hydroxy: 99.48 ng/mL (ref 30–100)

## 2024-01-02 MED ORDER — OXYCODONE HCL ER 15 MG PO T12A
30.0000 mg | EXTENDED_RELEASE_TABLET | Freq: Three times a day (TID) | ORAL | Status: DC
  Administered 2024-01-02 – 2024-01-10 (×25): 30 mg via ORAL
  Filled 2024-01-02 (×25): qty 2

## 2024-01-02 NOTE — Progress Notes (Signed)
 Oncology  S: Patient has been admitted with intra pain related to fungating tumor as well as profound shortness of breath Status post thoracentesis 1.5 L of fluid removed Pain meds being adjusted by palliative care Dr. Phillips Odor     01/02/2024   11:17 AM 01/02/2024    4:41 AM 01/01/2024    8:09 PM  Vitals with BMI  Systolic 108 145 161  Diastolic 74 98 84  Pulse 87 87 94      Latest Ref Rng & Units 01/01/2024    6:06 AM 12/30/2023   12:57 PM 12/30/2023   11:08 AM  CBC  WBC 4.0 - 10.5 K/uL 9.3  11.7  11.3   Hemoglobin 12.0 - 15.0 g/dL 09.6  04.5  40.9   Hematocrit 36.0 - 46.0 % 34.2  35.3  34.9   Platelets 150 - 400 K/uL 459  509  531       Latest Ref Rng & Units 01/01/2024    6:06 AM 12/31/2023    5:57 AM 12/30/2023   11:08 AM  CMP  Glucose 70 - 99 mg/dL 811  78  93   BUN 6 - 20 mg/dL 12  10  9    Creatinine 0.44 - 1.00 mg/dL 9.14  7.82  9.56   Sodium 135 - 145 mmol/L 138  142  140   Potassium 3.5 - 5.1 mmol/L 4.2  4.1  4.1   Chloride 98 - 111 mmol/L 106  108  108   CO2 22 - 32 mmol/L 23  22  26    Calcium 8.9 - 10.3 mg/dL 8.6  9.1  9.1   Total Protein 6.5 - 8.1 g/dL  7.0  7.3   Total Bilirubin 0.0 - 1.2 mg/dL  0.5  0.3   Alkaline Phos 38 - 126 U/L  129  158   AST 15 - 41 U/L  64  69   ALT 0 - 44 U/L  31  31    Assessment and plan Metastatic breast cancer with extensive tumor involving the right chest wall extending into the thoracic cavity with malignant pleural effusion as well as extensive pleural thickening.  In addition to that extensive liver metastases.  Unfortunately she has progressed on multiple lines of therapy.  I discussed with her that further treatments would be futile and that we need to stop the immunotherapy and pursue hospice care. Severe pain: Dr. Phillips Odor is adjusting her pain medication.  Patient is agreeable to go home with hospice when she is ready to be discharged. Her support system is extremely weak with family members who themselves are not in great  health and unreliable.  Thank you very much for taking care of her in the hospital.

## 2024-01-02 NOTE — Progress Notes (Signed)
 PROGRESS NOTE  Misty Arnold  WUJ:[id number] DOB: 25-Jul-1971 DOA: 12/30/2023 PCP: Serena Croissant, MD   Brief Narrative: Patient is a 53 year old female with history of metastatic right breast cancer status postmastectomy, palliative radiation/chemotherapy currently on Keytruda, chronic fungating right chest wall mass, spinal mets status post T7-T10 decompressive laminectomy, cancer surgery pain, depression, anxiety who was requested from cancer center for direct admission for the evaluation of uncontrolled cancer associated pain.  Oncology recommended hospitalization for pain management and palliative care consultation.  On presentation, she was in sinus tachycardia.  EKG showed: QTc of 515.  Patient was started on IV pain medications.  Palliative care consulted.  Ongoing goals of care, palliative care closely following.  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  Has diffuse metastatic disease.  Oncology does not recommend further treatment because of this progression, worsening performance status. Continue pain management, supportive care, palliative care consulted.  Continue bowel regimen. CT chest with contrast showed Interval progression of disease with increase in the size of right chest wall mass and right pleural effusion. Progression of compressive atelectasis of the right lung with mass effect and shift of the mediastinum to the left of the midline.  Status post thoracentesis on right side with removal of 1.4 L of dark fluid.  Currently she is on room air  Chronic metastatic right chest wall wound: Patient does her own wound care at  home.  Dressing changed twice a day.  Wound care consulted.  CT shows progression of metastatic burden.  She is hospice appropriate.  Palliative care following and discussing goals of care.  Poor oral intake: On dexamethasone for appetite.  Nutritionist consulted.  Appetite has improved  Hypertension: Continue atenolol  Anxiety/depression :on mirtazapine  Generalized deconditioning: Due to progression of her metastatic breast cancer.  Waiting for goal of care discussion.  If not hospice appropriate, will consult PT and OT  Goals of care: Progressive metastatic breast cancer.  Very poor prognosis with poor quality of life.  Palliative care consulted for goals of care.  Patient appropriate for hospice.  Patient lives with her family.    Nutrition Problem: Moderate Malnutrition Etiology: chronic illness (cancer and cancer related treatments)    DVT prophylaxis:enoxaparin (LOVENOX) injection 40 mg Start: 01/01/24 1000     Code Status: Full Code  Family Communication: None at the bedside  Patient status:Inpatient  Patient is from :Home  Anticipated discharge to: Home  Estimated DC date: Awaiting palliative care recommendation/goals of care discussion.   Consultants: Palliative care  Procedures: None  Antimicrobials:  Anti-infectives (From admission, onward)    None       Subjective: Patient seen and examined at bedside today.  She appears comfortable this morning.  On room air.  Her appetite has improved.  She is eating.  She had a bowel movement yesterday.  She does not look in any kind of distress.  She is expecting to talk to palliative care today, no new complaints.  Objective: Vitals:   01/01/24 1505 01/01/24 1509 01/01/24 2009 01/02/24 0441  BP: 129/84 122/81 117/84 (!) 145/98  Pulse:   94 87  Resp:   18 18  Temp:  98.6 F (37 C) (!) 97.3 F (36.3 C)  TempSrc:   Oral Oral  SpO2:   93% 94%  Weight:      Height:       No intake or output data in the 24  hours ending 01/02/24 1058  Filed Weights   12/30/23 1220  Weight: 54.4 kg    Examination:   General exam: Overall comfortable, not in distress, deconditioned, chronically looking HEENT: PERRL Respiratory system:  no wheezes or crackles, diminished air sounds on the right side Cardiovascular system: S1 & S2 heard, RRR.  Gastrointestinal system: Abdomen is nondistended, soft and nontender. Central nervous system: Alert and oriented Extremities: No edema, no clubbing ,no cyanosis Skin: Fungating mass on the chest   Data Reviewed: I have personally reviewed following labs and imaging studies  CBC: Recent Labs  Lab 12/30/23 1108 12/30/23 1257 01/01/24 0606  WBC 11.3* 11.7* 9.3  NEUTROABS 8.6*  --   --   HGB 10.8* 10.9* 10.1*  HCT 34.9* 35.3* 34.2*  MCV 86.2 88.3 90.7  PLT 531* 509* 459*   Basic Metabolic Panel: Recent Labs  Lab 12/30/23 1108 12/31/23 0557 01/01/24 0606  NA 140 142 138  K 4.1 4.1 4.2  CL 108 108 106  CO2 26 22 23   GLUCOSE 93 78 108*  BUN 9 10 12   CREATININE 0.92 0.87 0.88  CALCIUM 9.1 9.1 8.6*     No results found for this or any previous visit (from the past 240 hours).   Radiology Studies: DG Chest 1 View Result Date: 01/01/2024 CLINICAL DATA:  [id number] S/P thoracentesis [id number] EXAM: PORTABLE CHEST 1 VIEW COMPARISON:  IR ultrasound, earlier same day. FINDINGS: Cardiac silhouette is within normal limits. The LEFT lung is well inflated and clear. Improved aeration of the RIGHT lung post thoracentesis with no significant residual pleural effusion. Ex vacuo change at the RIGHT lower lobe with incomplete inflation. No pneumothorax. RIGHT axillary surgical clips. Chronic RIGHT chest wall deformity with multilevel lytic osseous lesions. Postsurgical changes of thoracic spine fixation. No interval osseous abnormality. IMPRESSION: 1. Improved aeration of the RIGHT lung post thoracentesis with no significant residual pleural effusion. 2. Incomplete  re-inflation of the RIGHT lower lobe, likely ex vacuo. No pneumothorax. Electronically Signed   By: Roanna Banning M.D.   On: 01/01/2024 16:30   US THORACENTESIS ASP PLEURAL SPACE W/IMG GUIDE Result Date: 01/01/2024 INDICATION: 53 year old female with metastatic right breast cancer and right pleural effusion for therapeutic thoracentesis. EXAM: ULTRASOUND GUIDED RIGHT THORACENTESIS MEDICATIONS: 12 mL 1% lidocaine COMPLICATIONS: None immediate. PROCEDURE: An ultrasound guided thoracentesis was thoroughly discussed with the patient and questions answered. The benefits, risks, alternatives and complications were also discussed. The patient understands and wishes to proceed with the procedure. Written consent was obtained. Ultrasound was performed to localize and mark an adequate pocket of fluid in the right chest. The area was then prepped and draped in the normal sterile fashion. 1% Lidocaine was used for local anesthesia. Under ultrasound guidance a 8 Fr Safe-T-Centesis catheter was introduced. Thoracentesis was performed. The catheter was removed and a dressing applied. FINDINGS: A total of approximately 1.4 L of dark yellow fluid was removed. Samples were sent to the laboratory as requested by the clinical team. IMPRESSION: Successful ultrasound guided diagnostic and therapeutic RIGHT thoracentesis yielding 1.4 L of pleural fluid. Performed By Theresa Mulligan, PA-C and supervised by Roanna Banning, MD Electronically Signed   By: Roanna Banning M.D.   On: 01/01/2024 16:25  CT CHEST W CONTRAST Result Date: 01/01/2024 CLINICAL DATA:  Chest wall pain. No trauma. Known malignancy. History of breast cancer. EXAM: CT CHEST WITH CONTRAST TECHNIQUE: Multidetector CT imaging of the chest was performed during intravenous contrast administration. RADIATION DOSE REDUCTION: This exam was performed according to the departmental dose-optimization program which includes automated exposure control, adjustment of the mA and/or kV  according to patient size and/or use of iterative reconstruction technique. CONTRAST:  75mL OMNIPAQUE IOHEXOL 300 MG/ML  SOLN COMPARISON:  Chest CT dated 11/22/2023. FINDINGS: Cardiovascular: There is no cardiomegaly. There is coronary vascular calcification. The thoracic aorta is unremarkable. 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 The origins of the great vessels of the aortic arch and the central pulmonary arteries appear patent as visualized. Mediastinum/Nodes: No obvious hilar adenopathy. Evaluation is however limited due to pleural effusion and consolidative changes of the lung. Mildly enlarged lymph nodes in the anterior mediastinum measure up to 13 mm in short axis. The esophagus is grossly unremarkable. Lungs/Pleura: Large right pleural effusion with interval increase in the size of the pleural effusion compared to prior CT. There is compressive atelectasis of the majority of the right lung progressed since the prior CT. There is associated mass effect and shift of the mediastinum to the left of the midline. No pneumothorax. The central airways are patent. Upper Abdomen: Hepatic metastatic disease. Musculoskeletal: Large mass involving the right breast extending into the skin and right chest wall. This mass measures approximately 7 x 14 cm in greatest axial dimensions. There is invasion into the intercostal and pleural space. There is destruction of the sternum with invasion of the mass into the anterior mediastinum. The structures changes of the anterolateral right fourth-seventh ribs. Additional mass in the right posterior chest wall with destructive changes of the posterior eighth and ninth ribs. Similar appearance of a lobulated mass in the left axilla measuring approximately 4 x 6 cm. Overall there is progression of disease since the prior CT. There is extension of mass into the central canal at T8-T9. Lytic lesion involving the T8 and T9 as seen previously.  Posterior spinal hardware again noted. IMPRESSION: 1. Interval progression of disease with increase in the size of right chest wall mass and right pleural effusion. Progression of compressive atelectasis of the right lung with mass effect and shift of the mediastinum to the left of the midline. 2. Hepatic metastatic disease. Electronically Signed   By: Elgie Collard M.D.   On: 01/01/2024 12:20    Scheduled Meds:  atenolol  50 mg Oral BID   celecoxib  200 mg Oral QHS   dexamethasone (DECADRON) injection  4 mg Intravenous Q12H   enoxaparin (LOVENOX) injection  40 mg Subcutaneous Q24H   feeding supplement  1 Container Oral TID BM   metroNIDAZOLE   Topical BID   mirtazapine  15 mg Oral QHS   multivitamins with iron  1 tablet Oral Daily   oxyCODONE  15 mg Oral Q8H   polyethylene glycol  17 g Oral Daily   senna-docusate  1 tablet Oral BID   Continuous Infusions:     LOS: 3 days   Burnadette Pop, MD [facility] P2/27/2025, 10:58 AM

## 2024-01-02 NOTE — Progress Notes (Signed)
 Palliative Care Progress Note  Met with Misty Arnold today to discuss findings on CT scan from yesterday, discuss goals of care and to continue working on her pain and symptom management.  CT findings are concerning for progression of her cancer including metastatic disease that has now invaded the pleura of her lung causing a large compressive right pleural effusion.  She had decompression of 1.5 L fluid from that lung yesterday afternoon.  She tolerated the procedure well and feels much less short of breath and had immediate relief.  The CT also shows concerning signs for pathologic invasion of her cancer into the T8-T9 of central canal and vertebral spaces this likely explains her acute onset of worsening pain that she experienced over the past few days.  She has also been on cancer treatment with [facility] and was unable to receive her last treatment.  She has had a total of 3 treatments.  During her last visit with Dr. Ave Filter they discussed hospice care and she was admitted to the hospital for symptom management.  Given the findings of the CT and progression of her cancer while receiving treatment she would like to discuss next steps with Dr. Ave Filter and wants to know what her options are before deciding about hospice care.  We also discussed her current symptom management she states that she still requires quite a few doses of IV hydromorphone she finds that overnight her pain is much worse when she is not getting consistent IV medication.  Her main goal is to have her pain controlled she feels like that she can cope better and do the things she needs to do when she is not in severe pain.  She was able to sleep last night following the thoracentesis I discussed with her that we are unsure when the fluid may return but it is likely to return and she may need another therapeutic thoracentesis or something like a Pleurx catheter depending on goals of care and progression of her cancer.  She denies any increased  cutaneous signs of her malignancy or signs of infection in the wound or increased drainage.  We talked about the concept of how we move forward if further treatment is not an option.  This includes a conversation about PCA and IV pain medication.  Additionally I encouraged her to think about making sure that her affairs are in order and we discussed how she would like to spend the time that she has- in terms of what that needs and support she would need during that time.  Plan: We discussed a DO NOT RESUSCITATE order.  She is in agreement with this.  She still would like to receive treatment for reversible illness and would like to discuss next steps with Dr. Pamelia Hoit prior to any further decisions.  Increase Oxycontin, add oral hydromorphone  She is requesting a vitamin D level be drawn.  DC plan to be determined.  Anderson Malta, DO Palliative Medicine   Time:70 minutes

## 2024-01-03 DIAGNOSIS — G893 Neoplasm related pain (acute) (chronic): Secondary | ICD-10-CM | POA: Diagnosis not present

## 2024-01-03 MED ORDER — POLYETHYLENE GLYCOL 3350 17 G PO PACK
17.0000 g | PACK | Freq: Two times a day (BID) | ORAL | Status: DC
  Administered 2024-01-05 – 2024-02-16 (×36): 17 g via ORAL
  Filled 2024-01-03 (×71): qty 1

## 2024-01-03 MED ORDER — NALOXONE HCL 0.4 MG/ML IJ SOLN
0.4000 mg | INTRAMUSCULAR | Status: DC | PRN
Start: 2024-01-03 — End: 2024-01-23

## 2024-01-03 MED ORDER — DIPHENHYDRAMINE HCL 12.5 MG/5ML PO ELIX: 12.5000 mg | ORAL_SOLUTION | Freq: Four times a day (QID) | ORAL | Status: DC | PRN

## 2024-01-03 MED ORDER — DIPHENHYDRAMINE HCL 50 MG/ML IJ SOLN: 12.5000 mg | Freq: Four times a day (QID) | INTRAMUSCULAR | Status: DC | PRN

## 2024-01-03 MED ORDER — SODIUM CHLORIDE 0.9% FLUSH
10.0000 mL | Freq: Two times a day (BID) | INTRAVENOUS | Status: DC
  Administered 2024-01-03 – 2024-01-10 (×10): 10 mL

## 2024-01-03 MED ORDER — HYDROMORPHONE 1 MG/ML IV SOLN
INTRAVENOUS | Status: DC
  Administered 2024-01-03 (×2): 3 mg via INTRAVENOUS
  Administered 2024-01-03: 6 mg via INTRAVENOUS
  Administered 2024-01-03: 5 mg via INTRAVENOUS
  Administered 2024-01-03: 30 mg via INTRAVENOUS
  Administered 2024-01-04: 8 mg via INTRAVENOUS
  Administered 2024-01-04: 2 mg via INTRAVENOUS
  Administered 2024-01-04: 5 mg via INTRAVENOUS
  Administered 2024-01-04: 30 mg via INTRAVENOUS
  Administered 2024-01-05: 5 mg via INTRAVENOUS
  Administered 2024-01-05: 2 mg via INTRAVENOUS
  Administered 2024-01-05 (×2): 4 mg via INTRAVENOUS
  Administered 2024-01-05: 30 mg via INTRAVENOUS
  Administered 2024-01-05: 9 mg via INTRAVENOUS
  Administered 2024-01-05: 8.49 mg via INTRAVENOUS
  Administered 2024-01-06: 3 mg via INTRAVENOUS
  Administered 2024-01-06: 30 mg via INTRAVENOUS
  Administered 2024-01-06: 6 mg via INTRAVENOUS
  Administered 2024-01-06 (×3): 3 mg via INTRAVENOUS
  Administered 2024-01-07: 1 mg via INTRAVENOUS
  Administered 2024-01-07: 4 mg via INTRAVENOUS
  Administered 2024-01-07: 3 mg via INTRAVENOUS
  Administered 2024-01-07: 5 mg via INTRAVENOUS
  Administered 2024-01-07: 2 mg via INTRAVENOUS
  Administered 2024-01-08 (×2): 3 mg via INTRAVENOUS
  Administered 2024-01-08: 1.81 mg via INTRAVENOUS
  Administered 2024-01-08: 3.81 mg via INTRAVENOUS
  Administered 2024-01-08: 1 mg via INTRAVENOUS
  Administered 2024-01-08: 4 mg via INTRAVENOUS
  Administered 2024-01-09: 2 mg via INTRAVENOUS
  Administered 2024-01-09: 30 mg via INTRAVENOUS
  Administered 2024-01-09: 3.2 mg via INTRAVENOUS
  Administered 2024-01-09: 3.59 mg via INTRAVENOUS
  Administered 2024-01-09: 7 mg via INTRAVENOUS
  Administered 2024-01-10: 4 mg via INTRAVENOUS
  Administered 2024-01-10: 8 mg via INTRAVENOUS
  Administered 2024-01-10: 30 mg via INTRAVENOUS
  Administered 2024-01-10 – 2024-01-11 (×2): 2 mg via INTRAVENOUS
  Administered 2024-01-11: 7 mg via INTRAVENOUS
  Administered 2024-01-11 (×2): 6 mg via INTRAVENOUS
  Administered 2024-01-11: 30 mg via INTRAVENOUS
  Administered 2024-01-11: 4.77 mg via INTRAVENOUS
  Administered 2024-01-12: 2 mg via INTRAVENOUS
  Administered 2024-01-12: 5 mg via INTRAVENOUS
  Administered 2024-01-12: 30 mg via INTRAVENOUS
  Administered 2024-01-13: 1 mg via INTRAVENOUS
  Administered 2024-01-13: 3 mg via INTRAVENOUS
  Administered 2024-01-13: 4 mg via INTRAVENOUS
  Administered 2024-01-13: 6 mg via INTRAVENOUS
  Administered 2024-01-14 (×3): 3 mg via INTRAVENOUS
  Administered 2024-01-14: 2 mg via INTRAVENOUS
  Administered 2024-01-14: 0.1 mg via INTRAVENOUS
  Administered 2024-01-14: 30 mg via INTRAVENOUS
  Administered 2024-01-15: 5 mg via INTRAVENOUS
  Administered 2024-01-15: 2 mg via INTRAVENOUS
  Administered 2024-01-15: 3 mg via INTRAVENOUS
  Administered 2024-01-15: 4 mg via INTRAVENOUS
  Administered 2024-01-15: 8 mg via INTRAVENOUS
  Administered 2024-01-15: 30 mg via INTRAVENOUS
  Administered 2024-01-15: 10.6 mg via INTRAVENOUS
  Administered 2024-01-16: 30 mg via INTRAVENOUS
  Administered 2024-01-16: 5 mg via INTRAVENOUS
  Administered 2024-01-16: 7 mg via INTRAVENOUS
  Administered 2024-01-16: 3 mg via INTRAVENOUS
  Administered 2024-01-16: 10 mg via INTRAVENOUS
  Administered 2024-01-16: 1.73 mg via INTRAVENOUS
  Administered 2024-01-17: 3 mg via INTRAVENOUS
  Administered 2024-01-17: 10 mg via INTRAVENOUS
  Administered 2024-01-17: 30 mg via INTRAVENOUS
  Administered 2024-01-17: 4 mg via INTRAVENOUS
  Administered 2024-01-17: 3 mg via INTRAVENOUS
  Administered 2024-01-17: 2 mg via INTRAVENOUS
  Administered 2024-01-18: 7 mg via INTRAVENOUS
  Administered 2024-01-18: 30 mg via INTRAVENOUS
  Administered 2024-01-18: 6 mg via INTRAVENOUS
  Administered 2024-01-19: 30 mg via INTRAVENOUS
  Administered 2024-01-19: 1 mg via INTRAVENOUS
  Administered 2024-01-19: 6 mg via INTRAVENOUS
  Administered 2024-01-19: 7 mg via INTRAVENOUS
  Administered 2024-01-19: 2 mg via INTRAVENOUS
  Administered 2024-01-20: 3 mg via INTRAVENOUS
  Administered 2024-01-20: 7 mg via INTRAVENOUS
  Administered 2024-01-20: 3 mg via INTRAVENOUS
  Administered 2024-01-20: 10 mg via INTRAVENOUS
  Administered 2024-01-20: 7 mg via INTRAVENOUS
  Administered 2024-01-20: 6 mg via INTRAVENOUS
  Administered 2024-01-20: 9 mg via INTRAVENOUS
  Administered 2024-01-20: 30 mg via INTRAVENOUS
  Administered 2024-01-21: 3 mg via INTRAVENOUS
  Administered 2024-01-21 (×2): 8 mg via INTRAVENOUS
  Administered 2024-01-21: 30 mg via INTRAVENOUS
  Administered 2024-01-22: 4 mg via INTRAVENOUS
  Administered 2024-01-22: 30 mg via INTRAVENOUS
  Administered 2024-01-22: 2 mg via INTRAVENOUS
  Administered 2024-01-22: 3 mg via INTRAVENOUS
  Administered 2024-01-22: 4 mg via INTRAVENOUS
  Administered 2024-01-23 (×2): 5 mg via INTRAVENOUS
  Administered 2024-01-23: 10 mg via INTRAVENOUS
  Administered 2024-01-23 (×2): 3 mg via INTRAVENOUS
  Filled 2024-01-03 (×19): qty 30

## 2024-01-03 MED ORDER — HYDROMORPHONE HCL 2 MG/ML IJ SOLN
2.0000 mg | INTRAMUSCULAR | Status: DC | PRN
  Administered 2024-01-09 – 2024-01-28 (×3): 2 mg via INTRAVENOUS
  Filled 2024-01-03 (×3): qty 1

## 2024-01-03 MED ORDER — LORAZEPAM 2 MG/ML IJ SOLN: 1.0000 mg | INTRAMUSCULAR | Status: DC | PRN

## 2024-01-03 MED ORDER — SODIUM CHLORIDE 0.9% FLUSH
9.0000 mL | INTRAVENOUS | Status: DC | PRN
Start: 2024-01-03 — End: 2024-01-23

## 2024-01-03 NOTE — Plan of Care (Signed)

## 2024-01-03 NOTE — Progress Notes (Signed)

## 2024-01-03 NOTE — Progress Notes (Signed)
 PROGRESS NOTE  Misty Arnold  ZOX:[id number] DOB: 1971/02/09 DOA: 12/30/2023 PCP: Serena Croissant, MD   Brief Narrative: Patient is a 53 year old female with history of metastatic right breast cancer status postmastectomy, palliative radiation/chemotherapy currently on Keytruda, chronic fungating right chest wall mass, spinal mets status post T7-T10 decompressive laminectomy, cancer surgery pain, depression, anxiety who was requested from cancer center for direct admission for the evaluation of uncontrolled cancer associated pain.  Oncology recommended hospitalization for pain management and palliative care consultation.   Patient was started on IV pain medications.  Palliative care consulted.  Ongoing goals of care, palliative care closely following.  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  Has diffuse metastatic disease.  Oncology does not recommend further treatment because of this progression, worsening performance status and recommends hospice. CT chest with contrast showed Interval progression of disease with increase in the size of right chest wall mass and right pleural effusion. Progression of compressive atelectasis of the right lung with mass effect and shift of the mediastinum to the left of the midline.  Status post thoracentesis on right side with removal of 1.4 L of dark fluid.  Currently she is on room air. Due to severe back pain, started on PCA on 2/28.  Palliative care assisting with pain management. Continue bowel regimen  Chronic metastatic right chest wall wound: Patient does her own  wound care at home.  Dressing changed twice a day.  Wound care consulted.  CT shows progression of metastatic burden.  She is hospice appropriate.  Palliative care following and discussing goals of care.  CODE STATUS changed to DNR.  Poor oral intake: On dexamethasone for appetite.  Nutritionist consulted.  Appetite has improved  Hypertension: Continue atenolol  Anxiety/depression :on mirtazapine  Generalized deconditioning: Due to progression of her metastatic breast cancer.  If patient declines hospice, will consult PT and OT when she nears dc date  Goals of care: Progressive metastatic breast cancer.  Very poor prognosis with poor quality of life.  Palliative care consulted for goals of care and following.  Patient appropriate for hospice.  Patient lives with her family.    Nutrition Problem: Moderate Malnutrition Etiology: chronic illness (cancer and cancer related treatments)    DVT prophylaxis:enoxaparin (LOVENOX) injection 40 mg Start: 01/01/24 1000     Code Status: Limited: Do not attempt resuscitation (DNR) -DNR-LIMITED -Do Not Intubate/DNI   Family Communication: None at the bedside  Patient status:Inpatient  Patient is from :Home  Anticipated discharge to: Home  Estimated DC date: Awaiting palliative care recommendation/goals of care discussion/optimal pain management.   Consultants: Palliative care  Procedures: None  Antimicrobials:  Anti-infectives (From admission, onward)    None       Subjective: Patient seen and examined at bedside today.  She complained of severe back pain this morning.  Started on PCA.  Lying in bed.  Hemodynamically stable.  Objective: Vitals:   01/02/24 1117 01/02/24 2040 01/03/24 0732 01/03/24 0857  BP: 108/74 121/84 123/86   Pulse: 87 88 85   Resp: 16 18 18 20   Temp: (!) 97.5 F (36.4 C) 98.1 F (36.7  C) 98.4 F (36.9 C)   TempSrc: Oral Oral Oral   SpO2: 94% 94% 92%   Weight:      Height:        Intake/Output  Summary (Last 24 hours) at 01/03/2024 1058 Last data filed at 01/02/2024 1300 Gross per 24 hour  Intake 600 ml  Output --  Net 600 ml    Filed Weights   12/30/23 1220  Weight: 54.4 kg    Examination:  General exam: Not in distress, chronically deconditioned, ill looking HEENT: PERRL Respiratory system:  no wheezes or crackles, diminished sounds on the right side Cardiovascular system: S1 & S2 heard, RRR.  Gastrointestinal system: Abdomen is nondistended, soft and nontender. Central nervous system: Alert and oriented Extremities: No edema, no clubbing ,no cyanosis Skin: Fungating mass on the right chest   Data Reviewed: I have personally reviewed following labs and imaging studies  CBC: Recent Labs  Lab 12/30/23 1108 12/30/23 1257 01/01/24 0606  WBC 11.3* 11.7* 9.3  NEUTROABS 8.6*  --   --   HGB 10.8* 10.9* 10.1*  HCT 34.9* 35.3* 34.2*  MCV 86.2 88.3 90.7  PLT 531* 509* 459*   Basic Metabolic Panel: Recent Labs  Lab 12/30/23 1108 12/31/23 0557 01/01/24 0606  NA 140 142 138  K 4.1 4.1 4.2  CL 108 108 106  CO2 26 22 23   GLUCOSE 93 78 108*  BUN 9 10 12   CREATININE 0.92 0.87 0.88  CALCIUM 9.1 9.1 8.6*     No results found for this or any previous visit (from the past 240 hours).   Radiology Studies: DG Chest 1 View Result Date: 01/01/2024 CLINICAL DATA:  [id number] S/P thoracentesis [id number] EXAM: PORTABLE CHEST 1 VIEW COMPARISON:  IR ultrasound, earlier same day. FINDINGS: Cardiac silhouette is within normal limits. The LEFT lung is well inflated and clear. Improved aeration of the RIGHT lung post thoracentesis with no significant residual pleural effusion. Ex vacuo change at the RIGHT lower lobe with incomplete inflation. No pneumothorax. RIGHT axillary surgical clips. Chronic RIGHT chest wall deformity with multilevel lytic osseous lesions. Postsurgical changes of thoracic spine fixation. No interval osseous abnormality. IMPRESSION: 1. Improved aeration of the RIGHT  lung post thoracentesis with no significant residual pleural effusion. 2. Incomplete re-inflation of the RIGHT lower lobe, likely ex vacuo. No pneumothorax. Electronically Signed   By: Roanna Banning M.D.   On: 01/01/2024 16:30   US THORACENTESIS ASP PLEURAL SPACE W/IMG GUIDE Result Date: 01/01/2024 INDICATION: 53 year old female with metastatic right breast cancer and right pleural effusion for therapeutic thoracentesis. EXAM: ULTRASOUND GUIDED RIGHT THORACENTESIS MEDICATIONS: 12 mL 1% lidocaine COMPLICATIONS: None immediate. PROCEDURE: An ultrasound guided thoracentesis was thoroughly discussed with the patient and questions answered. The benefits, risks, alternatives and complications were also discussed. The patient understands and wishes to proceed with the procedure. Written consent was obtained. Ultrasound was performed to localize and mark an adequate pocket of fluid in the right chest. The area was then prepped and draped in the normal sterile fashion. 1% Lidocaine was used for local anesthesia. Under ultrasound guidance a 8 Fr Safe-T-Centesis catheter was introduced. Thoracentesis was performed. The catheter was removed and a dressing applied. FINDINGS: A total of approximately 1.4 L of dark yellow fluid was removed. Samples were sent to the laboratory as requested by the clinical team. IMPRESSION: Successful ultrasound guided diagnostic and therapeutic RIGHT thoracentesis yielding 1.4 L of pleural fluid. Performed By Theresa Mulligan, PA-C and supervised by Roanna Banning, MD Electronically  Signed   By: Roanna Banning M.D.   On: 01/01/2024 16:25    Scheduled Meds:  atenolol  50 mg Oral BID   celecoxib  200 mg Oral QHS   dexamethasone (DECADRON) injection  4 mg Intravenous Q12H   enoxaparin (LOVENOX) injection  40 mg Subcutaneous Q24H   feeding supplement  1 Container Oral TID BM   HYDROmorphone   Intravenous Q4H   metroNIDAZOLE   Topical BID   mirtazapine  15 mg Oral QHS   multivitamins with iron   1 tablet Oral Daily   oxyCODONE  30 mg Oral Q8H   polyethylene glycol  17 g Oral Daily   senna-docusate  1 tablet Oral BID   sodium chloride flush  10-40 mL Intracatheter Q12H   Continuous Infusions:     LOS: 4 days   Burnadette Pop, MD [facility] P2/28/2025, 10:58 AM

## 2024-01-03 NOTE — Plan of Care (Signed)
 Patient is alert and oriented X4, PCA initiated this morning 0900. Patient's pain level is 8/10. Per MD Golding-pt does not need Q4 vital signs, CO2 monitor or continuous pulse ox due to comfort care measures. Pt will be going home with hospice and PCA is for comfort and pain management. Patient is ambulatory and able to perform own breast dressing changes, supplies at bedside.  Problem: Education: Goal: Knowledge of General Education information will improve Description: Including pain rating scale, medication(s)/side effects and non-pharmacologic comfort measures Outcome: Progressing   Problem: Clinical Measurements: Goal: Ability to maintain clinical measurements within normal limits will improve Outcome: Progressing   Problem: Coping: Goal: Level of anxiety will decrease Outcome: Progressing   Problem: Nutrition: Goal: Adequate nutrition will be maintained Outcome: Progressing

## 2024-01-04 DIAGNOSIS — G893 Neoplasm related pain (acute) (chronic): Secondary | ICD-10-CM | POA: Diagnosis not present

## 2024-01-04 LAB — CBC
HCT: 34.1 % — ABNORMAL LOW (ref 36.0–46.0)
Hemoglobin: 10.1 g/dL — ABNORMAL LOW (ref 12.0–15.0)
MCH: 27.1 pg (ref 26.0–34.0)
MCHC: 29.6 g/dL — ABNORMAL LOW (ref 30.0–36.0)
MCV: 91.4 fL (ref 80.0–100.0)
Platelets: 486 10*3/uL — ABNORMAL HIGH (ref 150–400)
RBC: 3.73 MIL/uL — ABNORMAL LOW (ref 3.87–5.11)
RDW: 18 % — ABNORMAL HIGH (ref 11.5–15.5)
WBC: 14.4 10*3/uL — ABNORMAL HIGH (ref 4.0–10.5)
nRBC: 0.2 % (ref 0.0–0.2)

## 2024-01-04 LAB — BASIC METABOLIC PANEL
Anion gap: 6 (ref 5–15)
BUN: 17 mg/dL (ref 6–20)
CO2: 29 mmol/L (ref 22–32)
Calcium: 9.2 mg/dL (ref 8.9–10.3)
Chloride: 105 mmol/L (ref 98–111)
Creatinine, Ser: 0.92 mg/dL (ref 0.44–1.00)
GFR, Estimated: >60 mL/min (ref >60)
Glucose, Bld: 141 mg/dL — ABNORMAL HIGH (ref 70–99)
Potassium: 4.3 mmol/L (ref 3.5–5.1)
Sodium: 140 mmol/L (ref 135–145)

## 2024-01-04 NOTE — Progress Notes (Signed)
 PROGRESS NOTE  Katianna Mcclenney  ZOX:[id number] DOB: 1970/12/13 DOA: 12/30/2023 PCP: Serena Croissant, MD   Brief Narrative: Patient is a 53 year old female with history of metastatic right breast cancer status postmastectomy, palliative radiation/chemotherapy currently on Keytruda, chronic fungating right chest wall mass, spinal mets status post T7-T10 decompressive laminectomy, cancer surgery pain, depression, anxiety who was requested from cancer center for direct admission for the evaluation of uncontrolled cancer associated pain.  Oncology recommended hospitalization for pain management and palliative care consultation.     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 Progression of compressive atelectasis of the right lung with mass effect and shift of the mediastinum to the left of the midline  Due to severe pain, started on PCA on 2/28, palliative care assisting with pain management. Oncology does not recommend further treatment because of progression, worsening performance status and recommends hospice Continue bowel regimen Plan for home with hospice  Right pleural effusion Currently on room air Status post thoracentesis on right side with removal of 1.4 L of dark fluid Supplemental O2 as needed  Chronic metastatic right chest wall wound Patient does her own wound care at home.  Dressing changed twice a day.  Wound care consulted CT shows progression of metastatic burden  Poor oral intake On dexamethasone for appetite.  Nutritionist  consulted  Hypertension Continue atenolol  Anxiety/depression Continue mirtazapine  Generalized deconditioning Due to progression of her metastatic breast cancer Hospice patient, PT/OT not required  Goals of care Progressive metastatic breast cancer.  Very poor prognosis with poor quality of life Palliative care consulted, appropriate for hospice. CODE STATUS changed to DNR.   Nutrition Problem: Moderate Malnutrition Etiology: chronic illness (cancer and cancer related treatments)    DVT prophylaxis:enoxaparin (LOVENOX) injection 40 mg Start: 01/01/24 1000     Code Status: Limited: Do not attempt resuscitation (DNR) -DNR-LIMITED -Do Not Intubate/DNI   Family Communication: None at the bedside  Patient status:Inpatient  Patient is from :Home  Anticipated discharge to: Home with hospice     Consultants: Palliative care  Procedures: None  Antimicrobials:  Anti-infectives (From admission, onward)    None       Subjective: Denies any new complaints. Pain controlled   Objective: Vitals:   01/04/24 0559 01/04/24 0843 01/04/24 1001 01/04/24 1635  BP: 115/69     Pulse: 93     Resp: 18 18 18 18   Temp: 97.8 F (36.6 C)     TempSrc: Oral     SpO2: 94%     Weight:      Height:        Intake/Output Summary (Last 24 hours) at 01/04/2024 1706 Last data filed at 01/04/2024 0900 Gross per 24 hour  Intake 240 ml  Output --  Net 240 ml    Filed Weights   12/30/23 1220  Weight: 54.4 kg    Examination: General: NAD, chronically ill-appearing, deconditioned Cardiovascular: S1, S2 present Respiratory: Diminished breath sounds on the right Abdomen: Soft, nontender, nondistended, bowel sounds present Musculoskeletal: No bilateral pedal edema noted Skin: Fungating mass on right chest wall  Psychiatry: Normal mood     Data Reviewed: I have personally reviewed following labs and imaging studies  CBC: Recent Labs  Lab 12/30/23 1108 12/30/23 1257  01/01/24 0606 01/04/24 0531  WBC 11.3* 11.7* 9.3 14.4*  NEUTROABS 8.6*  --   --   --   HGB 10.8* 10.9* 10.1* 10.1*  HCT 34.9* 35.3* 34.2* 34.1*  MCV 86.2 88.3 90.7 91.4  PLT 531* 509* 459* 486*   Basic Metabolic Panel: Recent Labs  Lab 12/30/23 1108 12/31/23 0557 01/01/24 0606 01/04/24 0531  NA 140 142 138 140  K 4.1 4.1 4.2 4.3  CL 108 108 106 105  CO2 26 22 23 29   GLUCOSE 93 78 108* 141*  BUN 9 10 12 17   CREATININE 0.92 0.87 0.88 0.92  CALCIUM 9.1 9.1 8.6* 9.2     No results found for this or any previous visit (from the past 240 hours).   Radiology Studies: No results found.   Scheduled Meds:  atenolol  50 mg Oral BID   celecoxib  200 mg Oral QHS   dexamethasone (DECADRON) injection  4 mg Intravenous Q12H   enoxaparin (LOVENOX) injection  40 mg Subcutaneous Q24H   feeding supplement  1 Container Oral TID BM   HYDROmorphone   Intravenous Q4H   metroNIDAZOLE   Topical BID   mirtazapine  15 mg Oral QHS   multivitamins with iron  1 tablet Oral Daily   oxyCODONE  30 mg Oral Q8H   polyethylene glycol  17 g Oral BID   senna-docusate  1 tablet Oral BID   sodium chloride flush  10-40 mL Intracatheter Q12H   Continuous Infusions:     LOS: 5 days   Briant Cedar, MD [facility] P3/11/2023, 5:06 PM

## 2024-01-04 NOTE — Progress Notes (Signed)
 Palliative Care Progress Note  Patient with 10/10 pain this AM, RN requesting medication increase. Orders placed for hydromorphone PCA and IV Ativan. When I arrived to see her, she was alert and awake. She told me that she "has not slept like that since she was a baby". She is processing her prognostic information and understands that she has exhausted her treatment options for her cancer. We discussed comfort care and focusing on managing her pain. She has not told her family yet about her condition and decline-they live in the [place] area of [place].   Her pain is well controlled on hydromorphone PCA, long acting oxycontin 30mg  q8 plus adjuvants steroids, Celebrex. She has used a total of 5 mg since initiating PCA  demand dose earlier today.. There is a Tourist information centre manager per pharmacy so if her usage increases then we will switch to fentanyl PCA.   Plan will be home with hospice and she is agreeable to this. She wants to go stay with her grandmother in [place] following this hospitalization. Will need help from [facility] finding hospice agency in this area-preferably one that has a hospice house in case she needs that in the future. Continue complex cancer pain management.  Anderson Malta, DO Palliative Medicine

## 2024-01-05 DIAGNOSIS — G893 Neoplasm related pain (acute) (chronic): Secondary | ICD-10-CM | POA: Diagnosis not present

## 2024-01-05 NOTE — Plan of Care (Signed)
  Problem: Education: Goal: Knowledge of General Education information will improve Description: Including pain rating scale, medication(s)/side effects and non-pharmacologic comfort measures Outcome: Progressing   Problem: Health Behavior/Discharge Planning: Goal: Ability to manage health-related needs will improve Outcome: Progressing   Problem: Clinical Measurements: Goal: Ability to maintain clinical measurements within normal limits will improve Outcome: Progressing Goal: Will remain free from infection Outcome: Progressing Goal: Diagnostic test results will improve Outcome: Progressing Goal: Respiratory complications will improve Outcome: Progressing   Problem: Activity: Goal: Risk for activity intolerance will decrease Outcome: Progressing   Problem: Nutrition: Goal: Adequate nutrition will be maintained Outcome: Progressing   Problem: Coping: Goal: Level of anxiety will decrease Outcome: Progressing   Problem: Elimination: Goal: Will not experience complications related to bowel motility Outcome: Progressing   Problem: Pain Managment: Goal: General experience of comfort will improve and/or be controlled Outcome: Progressing   Problem: Safety: Goal: Ability to remain free from injury will improve Outcome: Progressing   Problem: Skin Integrity: Goal: Risk for impaired skin integrity will decrease Outcome: Progressing

## 2024-01-05 NOTE — Progress Notes (Signed)
 PROGRESS NOTE  Misty Arnold  WUJ:[id number] DOB: [birth year] DOA: 12/30/2023 PCP: Serena Croissant, MD   Brief Narrative: Patient is a 53 year old female with history of metastatic right breast cancer status postmastectomy, palliative radiation/chemotherapy currently on Keytruda, chronic fungating right chest wall mass, spinal mets status post T7-T10 decompressive laminectomy, cancer surgery pain, depression, anxiety who was requested from cancer center for direct admission for the evaluation of uncontrolled cancer associated pain.  Oncology recommended hospitalization for pain management and palliative care consultation.     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 Progression of compressive atelectasis of the right lung with mass effect and shift of the mediastinum to the left of the midline  Due to severe pain, started on PCA on 2/28, palliative care assisting with pain management. Oncology does not recommend further treatment because of progression, worsening performance status and recommends hospice Continue bowel regimen Plan for home with hospice  Right pleural effusion Currently on room air Status post thoracentesis on right side with removal of 1.4 L of dark fluid Supplemental O2 as needed  Chronic metastatic right chest wall wound Patient does her own wound care at home.  Dressing changed twice a day.  Wound care consulted CT shows progression of metastatic burden  Poor oral intake On dexamethasone for appetite.  Nutritionist  consulted  Hypertension Continue atenolol  Anxiety/depression Continue mirtazapine  Generalized deconditioning Due to progression of her metastatic breast cancer Hospice patient, PT/OT not required  Goals of care Progressive metastatic breast cancer.  Very poor prognosis with poor quality of life Palliative care consulted, appropriate for hospice. CODE STATUS changed to DNR.   Nutrition Problem: Moderate Malnutrition Etiology: chronic illness (cancer and cancer related treatments)    DVT prophylaxis:enoxaparin (LOVENOX) injection 40 mg Start: 01/01/24 1000     Code Status: Limited: Do not attempt resuscitation (DNR) -DNR-LIMITED -Do Not Intubate/DNI   Family Communication: None at the bedside  Patient status:Inpatient  Patient is from :Home  Anticipated discharge to: Home with hospice     Consultants: Palliative care  Procedures: None  Antimicrobials:  Anti-infectives (From admission, onward)    None       Subjective: Denies any new complaints.   Objective: Vitals:   01/05/24 1403 01/05/24 1434 01/05/24 1554 01/05/24 1555  BP:  108/68    Pulse:  79    Resp: (!) 22 15 17 17   Temp:  98 F (36.7 C)    TempSrc:  Oral    SpO2:  97%    Weight:      Height:        Intake/Output Summary (Last 24 hours) at 01/05/2024 1632 Last data filed at 01/05/2024 1014 Gross per 24 hour  Intake 10 ml  Output --  Net 10 ml    Filed Weights   12/30/23 1220  Weight: 54.4 kg    Examination: General: NAD, chronically ill-appearing, deconditioned Cardiovascular: S1, S2 present Respiratory: Diminished breath sounds on the right Abdomen: Soft, nontender, nondistended, bowel sounds present Musculoskeletal: No bilateral pedal edema noted Skin: Fungating mass on right chest wall  Psychiatry: Normal mood     Data Reviewed: I have personally reviewed following labs and imaging studies  CBC: Recent Labs  Lab 12/30/23 1108 12/30/23 1257 01/01/24 0606  01/04/24 0531  WBC 11.3* 11.7* 9.3 14.4*  NEUTROABS 8.6*  --   --   --   HGB 10.8* 10.9* 10.1* 10.1*  HCT 34.9* 35.3* 34.2* 34.1*  MCV 86.2 88.3 90.7 91.4  PLT 531* 509* 459* 486*   Basic Metabolic Panel: Recent Labs  Lab 12/30/23 1108 12/31/23 0557 01/01/24 0606 01/04/24 0531  NA 140 142 138 140  K 4.1 4.1 4.2 4.3  CL 108 108 106 105  CO2 26 22 23 29   GLUCOSE 93 78 108* 141*  BUN 9 10 12 17   CREATININE 0.92 0.87 0.88 0.92  CALCIUM 9.1 9.1 8.6* 9.2     No results found for this or any previous visit (from the past 240 hours).   Radiology Studies: No results found.   Scheduled Meds:  atenolol  50 mg Oral BID   celecoxib  200 mg Oral QHS   dexamethasone (DECADRON) injection  4 mg Intravenous Q12H   enoxaparin (LOVENOX) injection  40 mg Subcutaneous Q24H   feeding supplement  1 Container Oral TID BM   HYDROmorphone   Intravenous Q4H   metroNIDAZOLE   Topical BID   mirtazapine  15 mg Oral QHS   multivitamins with iron  1 tablet Oral Daily   oxyCODONE  30 mg Oral Q8H   polyethylene glycol  17 g Oral BID   senna-docusate  1 tablet Oral BID   sodium chloride flush  10-40 mL Intracatheter Q12H   Continuous Infusions:     LOS: 6 days   Briant Cedar, MD [facility] P3/12/2023, 4:32 PM

## 2024-01-05 NOTE — Progress Notes (Signed)
 Shirely sitting up in chair for dinner.

## 2024-01-06 ENCOUNTER — Encounter: Payer: Self-pay | Admitting: Hematology and Oncology

## 2024-01-06 DIAGNOSIS — G893 Neoplasm related pain (acute) (chronic): Secondary | ICD-10-CM | POA: Diagnosis not present

## 2024-01-06 MED ORDER — DEXAMETHASONE 4 MG PO TABS
4.0000 mg | ORAL_TABLET | Freq: Every day | ORAL | Status: DC
  Administered 2024-01-07 – 2024-02-17 (×42): 4 mg via ORAL
  Filled 2024-01-06 (×44): qty 1

## 2024-01-06 MED ORDER — METRONIDAZOLE 0.75 % EX GEL
Freq: Four times a day (QID) | CUTANEOUS | Status: DC | PRN
  Administered 2024-01-18: 1 via TOPICAL
  Filled 2024-01-06: qty 45
  Filled 2024-01-06: qty 90
  Filled 2024-01-06 (×3): qty 45

## 2024-01-06 MED ORDER — AMMONIUM LACTATE 12 % EX LOTN: TOPICAL_LOTION | CUTANEOUS | Status: DC | PRN

## 2024-01-06 MED ORDER — LORAZEPAM 1 MG PO TABS
1.0000 mg | ORAL_TABLET | ORAL | Status: DC | PRN
  Administered 2024-01-09 – 2024-02-13 (×9): 1 mg via ORAL
  Filled 2024-01-06 (×9): qty 1

## 2024-01-06 MED ORDER — LORAZEPAM 2 MG/ML IJ SOLN: 1.0000 mg | INTRAMUSCULAR | Status: DC | PRN

## 2024-01-06 NOTE — Progress Notes (Signed)
 Palliative Care Progress Note  Pain is under much better control. She is thankful to finally be able to rest and does extremely well with PM ativan for anxiety. Usage of her PCA is stable.  We discussed a discharge plan today-she still wants to go to her grandmothers home in [place] with hospice services. Will need to engage TOC to assist with this- will need a hospice agency that has a hospice IPU because I anticipate there will be a need for this. She is very interested in having a home CADD pump PCA-this will depend largely on the hospice providers ability to provide and manage this. For now the plan is to keep her on PCA and long acting oxycodone TID.  Other symptoms include very dry skin on her lower legs-will order Amlactin.  Her appetite is improving. Maintain Decadron 4mg  oral daily.  No other changes today-she does her own wound care, no issues or increased drainage. Her Pleaural effusion appears to be stable-she has improved breath sounds since thoracentesis. Unclear if and when this effusion will reoccur. At least for today this appears stable- may need repeat CXR to assess-for now she does not appear to need a pleurx catheter,  Will continue to follow closely and assist with discharge home with hospice services.  Anderson Malta, DO Palliative Medicine   Time: 35 minutes.

## 2024-01-06 NOTE — Progress Notes (Signed)
 PROGRESS NOTE  Misty Arnold  ZOX:[id number] DOB: [birth year] DOA: 12/30/2023 PCP: Serena Croissant, MD   Brief Narrative: Patient is a 53 year old female with history of metastatic right breast cancer status postmastectomy, palliative radiation/chemotherapy currently on Keytruda, chronic fungating right chest wall mass, spinal mets status post T7-T10 decompressive laminectomy, cancer surgery pain, depression, anxiety who was requested from cancer center for direct admission for the evaluation of uncontrolled cancer associated pain.  Oncology recommended hospitalization for pain management and palliative care consultation.     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 Progression of compressive atelectasis of the right lung with mass effect and shift of the mediastinum to the left of the midline  Due to severe pain, started on PCA on 2/28, palliative care assisting with pain management. Oncology does not recommend further treatment because of progression, worsening performance status and recommends hospice Continue bowel regimen Plan for home with hospice  Right pleural effusion Currently on room air Status post thoracentesis on right side with removal of 1.4 L of dark fluid Supplemental O2 as needed  Chronic metastatic right chest wall wound Patient does her own wound care at home.  Dressing changed twice a day.  Wound care consulted CT shows progression of metastatic burden  Poor oral intake On dexamethasone for appetite.  Nutritionist  consulted  Hypertension Continue atenolol  Anxiety/depression Continue mirtazapine  Generalized deconditioning Due to progression of her metastatic breast cancer Hospice patient, PT/OT not required  Goals of care Progressive metastatic breast cancer.  Very poor prognosis with poor quality of life Palliative care consulted, appropriate for hospice. CODE STATUS changed to DNR.   Nutrition Problem: Moderate Malnutrition Etiology: chronic illness (cancer and cancer related treatments)    DVT prophylaxis:enoxaparin (LOVENOX) injection 40 mg Start: 01/01/24 1000     Code Status: Limited: Do not attempt resuscitation (DNR) -DNR-LIMITED -Do Not Intubate/DNI   Family Communication: None at the bedside  Patient status:Inpatient  Patient is from :Home  Anticipated discharge to: Home with hospice     Consultants: Palliative care  Procedures: None  Antimicrobials:  Anti-infectives (From admission, onward)    None       Subjective: Pain with better control   Objective: Vitals:   01/06/24 0747 01/06/24 0951 01/06/24 1216 01/06/24 1400  BP:  121/77  133/79  Pulse:  95  90  Resp: 16  20 18   Temp:    97.8 F (36.6 C)  TempSrc:    Oral  SpO2: 98%  99% 96%  Weight:      Height:        Intake/Output Summary (Last 24 hours) at 01/06/2024 1654 Last data filed at 01/06/2024 0730 Gross per 24 hour  Intake 600 ml  Output --  Net 600 ml    Filed Weights   12/30/23 1220  Weight: 54.4 kg    Examination: General: NAD, chronically ill-appearing, deconditioned Cardiovascular: S1, S2 present Respiratory: Diminished breath sounds on the right Abdomen: Soft, nontender, nondistended, bowel sounds present Musculoskeletal: No bilateral pedal edema noted Skin: Fungating mass on right chest wall Psychiatry:  Normal mood     Data Reviewed: I have personally reviewed following labs and imaging studies  CBC: Recent Labs  Lab 01/01/24 0606 01/04/24 0531  WBC 9.3  14.4*  HGB 10.1* 10.1*  HCT 34.2* 34.1*  MCV 90.7 91.4  PLT 459* 486*   Basic Metabolic Panel: Recent Labs  Lab 12/31/23 0557 01/01/24 0606 01/04/24 0531  NA 142 138 140  K 4.1 4.2 4.3  CL 108 106 105  CO2 22 23 29   GLUCOSE 78 108* 141*  BUN 10 12 17   CREATININE 0.87 0.88 0.92  CALCIUM 9.1 8.6* 9.2     No results found for this or any previous visit (from the past 240 hours).   Radiology Studies: No results found.   Scheduled Meds:  atenolol  50 mg Oral BID   celecoxib  200 mg Oral QHS   dexamethasone  4 mg Oral Daily   enoxaparin (LOVENOX) injection  40 mg Subcutaneous Q24H   feeding supplement  1 Container Oral TID BM   HYDROmorphone   Intravenous Q4H   mirtazapine  15 mg Oral QHS   multivitamins with iron  1 tablet Oral Daily   oxyCODONE  30 mg Oral Q8H   polyethylene glycol  17 g Oral BID   senna-docusate  1 tablet Oral BID   sodium chloride flush  10-40 mL Intracatheter Q12H   Continuous Infusions:     LOS: 7 days   Briant Cedar, MD [facility] P3/01/2024, 4:54 PM

## 2024-01-07 DIAGNOSIS — G893 Neoplasm related pain (acute) (chronic): Secondary | ICD-10-CM | POA: Diagnosis not present

## 2024-01-07 NOTE — TOC Progression Note (Signed)
 Transition of Care [facility]) - Progression Note    Patient Details  Name: Misty Arnold MRN: [id number] Date of Birth: 01/17/1971  [facility] CM/SW Contact  Beckie Busing, RN Phone [phone]  01/07/2024, 1:42 PM  Clinical Narrative:    Cm has made 2 attempts to meet with patient to discuss Hospice referral. Patient in restroom on both occasions. CM will come back later.         Expected Discharge Plan and Services                                               Social Determinants of Health (SDOH) Interventions SDOH Screenings   Food Insecurity: No Food Insecurity (12/30/2023)  Recent Concern: Food Insecurity - Food Insecurity Present (12/11/2023)  Housing: High Risk (12/30/2023)  Transportation Needs: Unmet Transportation Needs (12/30/2023)  Utilities: Not At Risk (12/30/2023)  Depression (PHQ2-9): Medium Risk (02/15/2020)  Social Connections: Moderately Integrated (12/30/2023)  Tobacco Use: Low Risk  (12/30/2023)    Readmission Risk Interventions    01/01/2024    9:51 AM 10/12/2021    3:40 PM  Readmission Risk Prevention Plan  Transportation Screening Complete Complete  PCP or Specialist Appt within 5-7 Days Complete   PCP or Specialist Appt within 3-5 Days  Complete  Home Care Screening Complete   Medication Review (RN CM) Complete   HRI or Home Care Consult  Complete  Social Work Consult for Recovery Care Planning/Counseling  Complete  Medication Review Oceanographer)  Complete

## 2024-01-07 NOTE — Plan of Care (Signed)

## 2024-01-07 NOTE — Progress Notes (Signed)
 Nutrition Follow-up  DOCUMENTATION CODES:   Non-severe (moderate) malnutrition in context of chronic illness (metastatic breast cancer)  INTERVENTION:   Boost Breeze po TID, each supplement provides 250 kcal and 9 grams of protein   Continued multivitamin intake daily   Ordered snacks TID in between meals to help meet calorie and protein needs and encouraged small, frequent meals   Continued intake of meals to meet calorie and protein needs  NUTRITION DIAGNOSIS:   Moderate Malnutrition related to chronic illness (cancer and cancer related treatments) as evidenced by energy intake < 75% for > or equal to 1 month, moderate muscle depletion, moderate fat depletion, percent weight loss.  Ongoing.  GOAL:   Patient will meet greater than or equal to 90% of their needs  Progressing.   MONITOR:   PO intake, Supplement acceptance, Weight trends  ASSESSMENT:   Pt with hx pf right breast metastatic cancer (s/p mastectomy, palliative radiation/chemotherapy, on Keytruda), spinal metastases, chest wall mass, chronic pain, depression/anxiety. Pt admitted for worsening back and breast pain and recommendation of oncologist for hospital admission for pain management.  Patient now discharging home with hospice.  Currently consuming 50-100% of meals at this time.  Accepting Boost Breeze somewhat.   Admission weight: 120 lbs No other weights this admission.  Medications: Multivitamin with minerals daily, Miralax, Senokot  Labs reviewed.  Diet Order:   Diet Order             Diet regular Room service appropriate? Yes; Fluid consistency: Thin  Diet effective now                   EDUCATION NEEDS:   Education needs have been addressed  Skin:  Skin Assessment: Reviewed RN Assessment (R chest wound and tumor)  Last BM:  3/1  Height:   Ht Readings from Last 1 Encounters:  12/30/23 5\' 5"  (1.651 m)    Weight:   Wt Readings from Last 1 Encounters:  12/30/23 54.4 kg     Ideal Body Weight:  56.8 kg  BMI:  Body mass index is 19.97 kg/m.  Estimated Nutritional Needs:   Kcal:  1600-1800  Protein:  80-95g  Fluid:  >/= 2L   Tilda Franco, MS, RD, LDN Inpatient Clinical Dietitian Contact via Secure chat

## 2024-01-07 NOTE — Progress Notes (Signed)
 PROGRESS NOTE  Misty Arnold  ZHY:[id number] DOB: 04-03-1971 DOA: 12/30/2023 PCP: Serena Croissant, MD   Brief Narrative: Patient is a 53 year old female with history of metastatic right breast cancer status postmastectomy, palliative radiation/chemotherapy currently on Keytruda, chronic fungating right chest wall mass, spinal mets status post T7-T10 decompressive laminectomy, cancer surgery pain, depression, anxiety who was requested from cancer center for direct admission for the evaluation of uncontrolled cancer associated pain.  Oncology recommended hospitalization for pain management and palliative care consultation.     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 Progression of compressive atelectasis of the right lung with mass effect and shift of the mediastinum to the left of the midline  Due to severe pain, started on PCA on 2/28, palliative care assisting with pain management. Oncology does not recommend further treatment because of progression, worsening performance status and recommends hospice Continue bowel regimen Plan for home with hospice  Right pleural effusion Currently on room air Status post thoracentesis on right side with removal of 1.4 L of dark fluid Supplemental O2 as needed  Chronic metastatic right chest wall wound Patient does her own wound care at home.  Dressing changed twice a day.  Wound care consulted CT shows progression of metastatic burden  Poor oral intake On dexamethasone for appetite.  Nutritionist  consulted  Hypertension Continue atenolol  Anxiety/depression Continue mirtazapine  Generalized deconditioning Due to progression of her metastatic breast cancer Hospice patient, PT/OT not required  Goals of care Progressive metastatic breast cancer.  Very poor prognosis with poor quality of life Palliative care consulted, appropriate for hospice. CODE STATUS changed to DNR.   Nutrition Problem: Moderate Malnutrition Etiology: chronic illness (cancer and cancer related treatments)    DVT prophylaxis:enoxaparin (LOVENOX) injection 40 mg Start: 01/01/24 1000     Code Status: Limited: Do not attempt resuscitation (DNR) -DNR-LIMITED -Do Not Intubate/DNI   Family Communication: None at the bedside  Patient status:Inpatient  Patient is from :Home  Anticipated discharge to: Home with hospice     Consultants: Palliative care  Procedures: None  Antimicrobials:  Anti-infectives (From admission, onward)    None       Subjective: Pain with better control   Objective: Vitals:   01/07/24 1046 01/07/24 1227 01/07/24 1419 01/07/24 1509  BP: 139/87   118/75  Pulse: (!) 110   97  Resp:  (!) 28 (!) 21 18  Temp:    (!) 97.3 F (36.3 C)  TempSrc:    Oral  SpO2:    97%  Weight:      Height:        Intake/Output Summary (Last 24 hours) at 01/07/2024 1733 Last data filed at 01/07/2024 1514 Gross per 24 hour  Intake 240 ml  Output --  Net 240 ml    Filed Weights   12/30/23 1220  Weight: 54.4 kg    Examination: General: NAD, chronically ill-appearing, deconditioned Cardiovascular: S1, S2 present Respiratory: Diminished breath sounds on the right Abdomen: Soft, nontender, nondistended, bowel sounds present Musculoskeletal: No bilateral pedal edema noted Skin: Fungating mass on  right chest wall Psychiatry: Normal mood     Data Reviewed: I have personally reviewed following labs and imaging studies  CBC: Recent Labs  Lab 01/01/24 0606 01/04/24 0531   WBC 9.3 14.4*  HGB 10.1* 10.1*  HCT 34.2* 34.1*  MCV 90.7 91.4  PLT 459* 486*   Basic Metabolic Panel: Recent Labs  Lab 01/01/24 0606 01/04/24 0531  NA 138 140  K 4.2 4.3  CL 106 105  CO2 23 29  GLUCOSE 108* 141*  BUN 12 17  CREATININE 0.88 0.92  CALCIUM 8.6* 9.2     No results found for this or any previous visit (from the past 240 hours).   Radiology Studies: No results found.   Scheduled Meds:  atenolol  50 mg Oral BID   celecoxib  200 mg Oral QHS   dexamethasone  4 mg Oral Daily   enoxaparin (LOVENOX) injection  40 mg Subcutaneous Q24H   feeding supplement  1 Container Oral TID BM   HYDROmorphone   Intravenous Q4H   mirtazapine  15 mg Oral QHS   multivitamins with iron  1 tablet Oral Daily   oxyCODONE  30 mg Oral Q8H   polyethylene glycol  17 g Oral BID   senna-docusate  1 tablet Oral BID   sodium chloride flush  10-40 mL Intracatheter Q12H   Continuous Infusions:     LOS: 8 days   Briant Cedar, MD [facility] P3/02/2024, 5:33 PM

## 2024-01-08 DIAGNOSIS — R627 Adult failure to thrive: Secondary | ICD-10-CM | POA: Diagnosis not present

## 2024-01-08 DIAGNOSIS — M549 Dorsalgia, unspecified: Secondary | ICD-10-CM | POA: Diagnosis not present

## 2024-01-08 DIAGNOSIS — E440 Moderate protein-calorie malnutrition: Secondary | ICD-10-CM | POA: Diagnosis not present

## 2024-01-08 DIAGNOSIS — C7951 Secondary malignant neoplasm of bone: Secondary | ICD-10-CM | POA: Diagnosis not present

## 2024-01-08 DIAGNOSIS — C799 Secondary malignant neoplasm of unspecified site: Secondary | ICD-10-CM | POA: Diagnosis not present

## 2024-01-08 DIAGNOSIS — G893 Neoplasm related pain (acute) (chronic): Secondary | ICD-10-CM | POA: Diagnosis not present

## 2024-01-08 DIAGNOSIS — R531 Weakness: Secondary | ICD-10-CM | POA: Diagnosis not present

## 2024-01-08 DIAGNOSIS — C50919 Malignant neoplasm of unspecified site of unspecified female breast: Secondary | ICD-10-CM | POA: Diagnosis not present

## 2024-01-08 NOTE — Progress Notes (Signed)
 Came in to measure pt breast wound. Pt had already been to the bathroom and performed her dressing change. Informed pt that when she goes to change her wound dressing again we will need to measure the wound. She informed me that she will not change it again until 9:30 tonight. I will relay to the night shift nurse that measurements need to be completed.

## 2024-01-08 NOTE — TOC Progression Note (Addendum)
 Transition of Care [facility]) - Progression Note    Patient Details  Name: Misty Arnold MRN: [id number] Date of Birth: [birth year]  [facility]) CM/SW Contact  Beckie Busing, RN Phone Number:[phone]  01/08/2024, 11:02 AM  Clinical Narrative:    TOC consulted for referral for Hospice in the [place] area. CM at bedside to discuss disposition plan with patient. Patient states that she is planing to go live with her grandmother that lives in [place]. Patient states that she is not real clear on the plan but thinks that she will need PCA pump at discharge. She states that she will discuss further with MD. Patient states that pain currently is an ongoing issue. Patient gives CM consent to reach out to Hospice agencies in the [place] area. CM will follow up with documentation.  1400 Hospice referral has been called to [facility]. Information provided. Awaiting return call from liaison.         Expected Discharge Plan and Services                                               Social Determinants of Health (SDOH) Interventions SDOH Screenings   Food Insecurity: No Food Insecurity (12/30/2023)  Recent Concern: Food Insecurity - Food Insecurity Present (12/11/2023)  Housing: High Risk (12/30/2023)  Transportation Needs: Unmet Transportation Needs (12/30/2023)  Utilities: Not At Risk (12/30/2023)  Depression (PHQ2-9): Medium Risk (02/15/2020)  Social Connections: Moderately Integrated (12/30/2023)  Tobacco Use: Low Risk  (12/30/2023)    Readmission Risk Interventions    01/01/2024    9:51 AM 10/12/2021    3:40 PM  Readmission Risk Prevention Plan  Transportation Screening Complete Complete  PCP or Specialist Appt within 5-7 Days Complete   PCP or Specialist Appt within 3-5 Days  Complete  Home Care Screening Complete   Medication Review (RN CM) Complete   HRI or Home Care Consult  Complete  Social Work Consult for Recovery Care Planning/Counseling   Complete  Medication Review Oceanographer)  Complete

## 2024-01-08 NOTE — Plan of Care (Signed)

## 2024-01-08 NOTE — Progress Notes (Addendum)
 [facility]                                                                               Misty Arnold, is a 53 y.o. female, DOB - June 04, 1971, JYN:[id number] Admit date - 12/30/2023    Outpatient Primary MD for the patient is Serena Croissant, MD  LOS - 9  days    Brief summary    Misty Arnold is a 53 y.o. female with medical history significant for metastatic right breast cancer (s/p mastectomy, palliative radiation/chemotherapy, currently on Keytruda), chronic fungating right chest wall mass, spinal metastases (s/p T7-T10 decompressive laminectomy), chronic cancer associated pain, depression/anxiety who was sent directly from the cancer center to the ED for evaluation of uncontrolled cancer associated pain.  Oncology recommended hospitalization for pain management and palliative care consultation.    Assessment & Plan    Assessment and Plan:  Intractable pain from metastatic breast cancer: From diffuse metastatic disease.  Oncology does not recommend further treatment because of progression, worsening performance status and recommends hospice.  Palliative care consulted for pain management.  She was started on PCA for pain.    Right pleural effusion.  S/p thoracentesis on right side with removal of 1.4 lit.    Hypertension Well controlled.    Chronic metastatic right chest wall wound.  Wound care consulted with recommendations.     Anxiety and depression Continue with mirtazapine.    Generalized weakness and physical deconditioning.  Supportive care.  Hospice care.   Anemia of chronic disease Hemoglobin around 10 and stable.   Independently reviewed labs   RN Pressure Injury Documentation: Pressure Injury 01/18/20 Coccyx Mid;Anterior Stage 2 -  Partial thickness loss of dermis presenting as a shallow open injury with a red, pink wound bed without slough. 2 small open areas that have 2 small open areas within them. (Active)  01/18/20 1830   Location: Coccyx  Location Orientation: Mid;Anterior  Staging: Stage 2 -  Partial thickness loss of dermis presenting as a shallow open injury with a red, pink wound bed without slough.  Wound Description (Comments): 2 small open areas that have 2 small open areas within them.  Present on Admission:     Malnutrition Type:  Nutrition Problem: Moderate Malnutrition Etiology: chronic illness (cancer and cancer related treatments)   Malnutrition Characteristics:  Signs/Symptoms: energy intake < 75% for > or equal to 1 month, moderate muscle depletion, moderate fat depletion, percent weight loss Percent weight loss: 10 % (in 4 months)   Nutrition Interventions:  Interventions: Boost Breeze, MVI, Snacks  Estimated body mass index is 19.97 kg/m as calculated from the following:   Height as of this encounter: 5\' 5"  (1.651 m).   Weight as of this encounter: 54.4 kg.  Code Status: DNR DVT Prophylaxis:  enoxaparin (LOVENOX) injection 40 mg Start: 01/01/24 1000   Level of Care: Level of care: Palliative Care Family Communication: none at bedside.   Disposition Plan:     Remains inpatient appropriate:  pending  Procedures:  None.   Consultants:   Palliative care  Antimicrobials:   Anti-infectives (From admission, onward)    None        Medications  Scheduled Meds:  atenolol  50 mg Oral BID   celecoxib  200 mg Oral QHS   dexamethasone  4 mg Oral Daily   enoxaparin (LOVENOX) injection  40 mg Subcutaneous Q24H   feeding supplement  1 Container Oral TID BM   HYDROmorphone   Intravenous Q4H   mirtazapine  15 mg Oral QHS   multivitamins with iron  1 tablet Oral Daily   oxyCODONE  30 mg Oral Q8H   polyethylene glycol  17 g Oral BID   senna-docusate  1 tablet Oral BID   sodium chloride flush  10-40 mL Intracatheter Q12H   Continuous Infusions: PRN Meds:.acetaminophen **OR** acetaminophen, ammonium lactate, diphenhydrAMINE **OR** diphenhydrAMINE, HYDROmorphone  (DILAUDID) injection, LORazepam, LORazepam, metroNIDAZOLE, naloxone **AND** sodium chloride flush, ondansetron (ZOFRAN) IV    Subjective:   Misty Arnold was seen and examined today. Pain sub optimally controlled. On PCA.   Objective:   Vitals:   01/08/24 0817 01/08/24 1032 01/08/24 1232 01/08/24 1347  BP:  (!) 178/89  118/75  Pulse:  (!) 104  86  Resp: 14  19 18   Temp:    98.1 F (36.7 C)  TempSrc:    Oral  SpO2:    100%  Weight:      Height:        Intake/Output Summary (Last 24 hours) at 01/08/2024 1420 Last data filed at 01/07/2024 1514 Gross per 24 hour  Intake 240 ml  Output --  Net 240 ml   Filed Weights   12/30/23 1220  Weight: 54.4 kg     Exam General exam: Ill appearing lady, in mild distress from pain.  Respiratory system: DIMINISHED on the right. Tachypnea.  Cardiovascular system: S1 & S2 heard, tachycardia.  Gastrointestinal system: Abdomen is soft BS+ Central nervous system: Alert and oriented.  Extremities: Symmetric 5 x 5 power. Skin: No rashes,  Psychiatry:  Mood & affect appropriate.    Data Reviewed:  I have personally reviewed following labs and imaging studies   CBC Lab Results  Component Value Date   WBC 14.4 (H) 01/04/2024   RBC 3.73 (L) 01/04/2024   HGB 10.1 (L) 01/04/2024   HCT 34.1 (L) 01/04/2024   MCV 91.4 01/04/2024   MCH 27.1 01/04/2024   PLT 486 (H) 01/04/2024   MCHC 29.6 (L) 01/04/2024   RDW 18.0 (H) 01/04/2024   LYMPHSABS 1.6 12/30/2023   MONOABS 0.9 12/30/2023   EOSABS 0.1 12/30/2023   BASOSABS 0.1 12/30/2023     Last metabolic panel Lab Results  Component Value Date   NA 140 01/04/2024   K 4.3 01/04/2024   CL 105 01/04/2024   CO2 29 01/04/2024   BUN 17 01/04/2024   CREATININE 0.92 01/04/2024   GLUCOSE 141 (H) 01/04/2024   GFRNONAA >60 01/04/2024   GFRAA >60 08/04/2020   CALCIUM 9.2 01/04/2024   PHOS 3.5 05/12/2021   PROT 7.0 12/31/2023   ALBUMIN 2.8 (L) 12/31/2023   BILITOT 0.5 12/31/2023   ALKPHOS  129 (H) 12/31/2023   AST 64 (H) 12/31/2023   ALT 31 12/31/2023   ANIONGAP 6 01/04/2024    CBG (last 3)  No results for input(s): "GLUCAP" in the last 72 hours.    Coagulation Profile: No results for input(s): "INR", "PROTIME" in the last 168 hours.   Radiology Studies: No results found.     Kathlen Mody M.D. [facility] 01/08/2024, 2:20 PM  Available via Epic secure chat 7am-7pm After 7 pm, please refer to night coverage provider listed on  amion.

## 2024-01-09 DIAGNOSIS — C50919 Malignant neoplasm of unspecified site of unspecified female breast: Secondary | ICD-10-CM | POA: Diagnosis not present

## 2024-01-09 DIAGNOSIS — R531 Weakness: Secondary | ICD-10-CM | POA: Diagnosis not present

## 2024-01-09 DIAGNOSIS — G893 Neoplasm related pain (acute) (chronic): Secondary | ICD-10-CM | POA: Diagnosis not present

## 2024-01-09 DIAGNOSIS — C799 Secondary malignant neoplasm of unspecified site: Secondary | ICD-10-CM | POA: Diagnosis not present

## 2024-01-09 DIAGNOSIS — E440 Moderate protein-calorie malnutrition: Secondary | ICD-10-CM | POA: Diagnosis not present

## 2024-01-09 DIAGNOSIS — R627 Adult failure to thrive: Secondary | ICD-10-CM | POA: Diagnosis not present

## 2024-01-09 DIAGNOSIS — C7951 Secondary malignant neoplasm of bone: Secondary | ICD-10-CM | POA: Diagnosis not present

## 2024-01-09 DIAGNOSIS — M549 Dorsalgia, unspecified: Secondary | ICD-10-CM | POA: Diagnosis not present

## 2024-01-09 MED ORDER — AMMONIUM LACTATE 12 % EX LOTN
TOPICAL_LOTION | Freq: Two times a day (BID) | CUTANEOUS | Status: DC
  Administered 2024-01-11 – 2024-02-13 (×8): 1 via TOPICAL
  Filled 2024-01-09 (×5): qty 225

## 2024-01-09 MED ORDER — METHYLNALTREXONE BROMIDE 12 MG/0.6ML ~~LOC~~ SOLN
12.0000 mg | Freq: Once | SUBCUTANEOUS | Status: AC
  Administered 2024-01-09: 12 mg via SUBCUTANEOUS
  Filled 2024-01-09: qty 0.6

## 2024-01-09 MED ORDER — LACTULOSE 10 GM/15ML PO SOLN
30.0000 g | Freq: Two times a day (BID) | ORAL | Status: DC | PRN
  Administered 2024-01-09: 30 g via ORAL
  Filled 2024-01-09 (×2): qty 45

## 2024-01-09 NOTE — Progress Notes (Signed)
 See new order

## 2024-01-09 NOTE — Progress Notes (Signed)
 [facility]                                                                               Misty Arnold, is a 53 y.o. female, DOB - January 06, 1971, [id number] Admit date - 12/30/2023    Outpatient Primary MD for the patient is Misty Croissant, MD  LOS - 10  days    Brief summary    Misty Arnold is a 53 y.o. female with medical history significant for metastatic right breast cancer (s/p mastectomy, palliative radiation/chemotherapy, currently on Keytruda), chronic fungating right chest wall mass, spinal metastases (s/p T7-T10 decompressive laminectomy), chronic cancer associated pain, depression/anxiety who was sent directly from the cancer center to the ED for evaluation of uncontrolled cancer associated pain.  Oncology recommended hospitalization for pain management and palliative care consultation.    Assessment & Plan    Assessment and Plan:  Intractable pain from metastatic breast cancer: From diffuse metastatic disease.  Oncology does not recommend further treatment because of progression, worsening performance status and recommends hospice.  Palliative care consulted for pain management.  She was started on PCA for pain.    Right pleural effusion.  S/p thoracentesis on right side with removal of 1.4 lit.  Some sob earlier today. She remains on RA and did not want any further testing.   Hypertension BP parameters are optimal.    Chronic metastatic right chest wall wound.  Wound care consulted with recommendations.     Anxiety and depression Continue with mirtazapine.    Generalized weakness and physical deconditioning.  Supportive care.  Hospice care.    Right upper and lower extremity swelling Offered to do venous duplex, patient politely refused the tests.  TED hoses ordered for comfort.    Moderate constipation:  Currently she is on senna and colace, along with miralax.  Prn lactulose added to the regimen.   Anemia of chronic  disease Hemoglobin around 10 and stable.   Independently reviewed labs   RN Pressure Injury Documentation: Pressure Injury 01/18/20 Coccyx Mid;Anterior Stage 2 -  Partial thickness loss of dermis presenting as a shallow open injury with a red, pink wound bed without slough. 2 small open areas that have 2 small open areas within them. (Active)  01/18/20 1830  Location: Coccyx  Location Orientation: Mid;Anterior  Staging: Stage 2 -  Partial thickness loss of dermis presenting as a shallow open injury with a red, pink wound bed without slough.  Wound Description (Comments): 2 small open areas that have 2 small open areas within them.  Present on Admission:     Malnutrition Type:  Nutrition Problem: Moderate Malnutrition Etiology: chronic illness (cancer and cancer related treatments)   Malnutrition Characteristics:  Signs/Symptoms: energy intake < 75% for > or equal to 1 month, moderate muscle depletion, moderate fat depletion, percent weight loss Percent weight loss: 10 % (in 4 months)   Nutrition Interventions:  Interventions: Boost Breeze, MVI, Snacks  Estimated body mass index is 19.97 kg/m as calculated from the following:   Height as of this encounter: 5\' 5"  (1.651 m).   Weight as of this encounter: 54.4 kg.  Code Status: DNR DVT Prophylaxis:  Place TED hose  Start: 01/09/24 1043 enoxaparin (LOVENOX) injection 40 mg Start: 01/01/24 1000   Level of Care: Level of care: Palliative Care Family Communication: none at bedside.   Disposition Plan:     Remains inpatient appropriate:  pending TOC.   Procedures:  None.   Consultants:   Palliative care  Antimicrobials:   Anti-infectives (From admission, onward)    None        Medications  Scheduled Meds:  ammonium lactate   Topical BID   atenolol  50 mg Oral BID   celecoxib  200 mg Oral QHS   dexamethasone  4 mg Oral Daily   enoxaparin (LOVENOX) injection  40 mg Subcutaneous Q24H   feeding supplement  1  Container Oral TID BM   HYDROmorphone   Intravenous Q4H   methylnaltrexone  12 mg Subcutaneous Once   mirtazapine  15 mg Oral QHS   multivitamins with iron  1 tablet Oral Daily   oxyCODONE  30 mg Oral Q8H   polyethylene glycol  17 g Oral BID   senna-docusate  1 tablet Oral BID   sodium chloride flush  10-40 mL Intracatheter Q12H   Continuous Infusions: PRN Meds:.acetaminophen **OR** acetaminophen, diphenhydrAMINE **OR** diphenhydrAMINE, HYDROmorphone (DILAUDID) injection, lactulose, LORazepam, LORazepam, metroNIDAZOLE, naloxone **AND** sodium chloride flush, ondansetron (ZOFRAN) IV    Subjective:   Akshaya Toepfer was seen and examined today. She reports going up on the Dilaudid PCA for pain not well controlled.    Objective:   Vitals:   01/09/24 0529 01/09/24 0817 01/09/24 1240 01/09/24 1403  BP: 118/81   113/74  Pulse: 92   88  Resp: 18 18 19 18   Temp: 97.9 F (36.6 C)   98.4 F (36.9 C)  TempSrc: Oral   Oral  SpO2: 93% 95% 95% 90%  Weight:      Height:       No intake or output data in the 24 hours ending 01/09/24 1546  Filed Weights   12/30/23 1220  Weight: 54.4 kg     Exam General exam: ill appearing lady not in distress.  Respiratory system: diminished air entry at bases more on the right.  Cardiovascular system: S1 & S2 heard, RRR. Gastrointestinal system: Abdomen is soft mild to mod tenderness.  Central nervous system: Alert and oriented.grossly non focal Extremities: swelling of the RLE.  Skin: No rashes,  Psychiatry:  mood is appropriate.     Data Reviewed:  I have personally reviewed following labs and imaging studies   CBC Lab Results  Component Value Date   WBC 14.4 (H) 01/04/2024   RBC 3.73 (L) 01/04/2024   HGB 10.1 (L) 01/04/2024   HCT 34.1 (L) 01/04/2024   MCV 91.4 01/04/2024   MCH 27.1 01/04/2024   PLT 486 (H) 01/04/2024   MCHC 29.6 (L) 01/04/2024   RDW 18.0 (H) 01/04/2024   LYMPHSABS 1.6 12/30/2023   MONOABS 0.9 12/30/2023    EOSABS 0.1 12/30/2023   BASOSABS 0.1 12/30/2023     Last metabolic panel Lab Results  Component Value Date   NA 140 01/04/2024   K 4.3 01/04/2024   CL 105 01/04/2024   CO2 29 01/04/2024   BUN 17 01/04/2024   CREATININE 0.92 01/04/2024   GLUCOSE 141 (H) 01/04/2024   GFRNONAA >60 01/04/2024   GFRAA >60 08/04/2020   CALCIUM 9.2 01/04/2024   PHOS 3.5 05/12/2021   PROT 7.0 12/31/2023   ALBUMIN 2.8 (L) 12/31/2023   BILITOT 0.5 12/31/2023   ALKPHOS 129 (H) 12/31/2023  AST 64 (H) 12/31/2023   ALT 31 12/31/2023   ANIONGAP 6 01/04/2024    CBG (last 3)  No results for input(s): "GLUCAP" in the last 72 hours.    Coagulation Profile: No results for input(s): "INR", "PROTIME" in the last 168 hours.   Radiology Studies: No results found.     Kathlen Mody M.D. [facility] 01/09/2024, 3:46 PM  Available via Epic secure chat 7am-7pm After 7 pm, please refer to night coverage provider listed on amion.

## 2024-01-09 NOTE — Progress Notes (Signed)
 No acute changes overnight, patient educated and demonstrated using PCA for pain mgmt this shift with effective results, abdomen still distended given prn lactulose , had x 2 BMs in the am, had no loss of appetite, fluids encouraged and tolerated well, c/o soreness to sacrum, inspected area, skin intact, applied barrier cream and sacral mepilex, also has same swelling to RUE and RLE since admit, applied TED hose and provided with pillows to keep extremities elevated, educated to group ADLs and ask staff for assist with getting room organized or bringing supplies, verbalized understanding, last PCA check pain 3/10, in bed resting, denies any further needs at this time, call light in reach

## 2024-01-09 NOTE — TOC Progression Note (Addendum)
 Transition of Care [facility]) - Progression Note    Patient Details  Name: Misty Arnold MRN: [id number] Date of Birth: [birth year]  Transition of Care [facility]) CM/SW Contact  Beckie Busing, RN Phone Number:[phone]  01/09/2024, 2:32 PM  Clinical Narrative:     Still no response from [facility]. CM can only get through on referral line and is unable to speak with liaison. CM has reached out to [employer] senior liaison with [facility] for assistance with possible referral in the [facility] area. There is no answer. Message has been left on secure voicemail. Will await return call.   CM received call from Crissie Sickles hospitial liaison for [facility]. [name] states that [name] is able to accept referral to provide service in [facility] for patient at discharge. Cm has explained that pain control is an issue and that patient may require a PCA at discharge. Per Florence Canner is able to manage PCA as well. Info has been provided to initiate referral.         Expected Discharge Plan and Services                                               Social Determinants of Health (SDOH) Interventions SDOH Screenings   Food Insecurity: No Food Insecurity (12/30/2023)  Recent Concern: Food Insecurity - Food Insecurity Present (12/11/2023)  Housing: High Risk (12/30/2023)  Transportation Needs: Unmet Transportation Needs (12/30/2023)  Utilities: Not At Risk (12/30/2023)  Depression (PHQ2-9): Medium Risk (02/15/2020)  Social Connections: Moderately Integrated (12/30/2023)  Tobacco Use: Low Risk  (12/30/2023)    Readmission Risk Interventions    01/01/2024    9:51 AM 10/12/2021    3:40 PM  Readmission Risk Prevention Plan  Transportation Screening Complete Complete  PCP or Specialist Appt within 5-7 Days Complete   PCP or Specialist Appt within 3-5 Days  Complete  Home Care Screening Complete   Medication Review (RN CM) Complete   HRI or Home Care Consult   Complete  Social Work Consult for Recovery Care Planning/Counseling  Complete  Medication Review [facility])  Complete

## 2024-01-09 NOTE — Progress Notes (Signed)
 Messaged provider  Patient has 2+ edema on her RUE and RLE  and her abdomen is distended. She is on Lovenox 40 mg which I have given her and she says the swelling just occurred in the last 2 days. She also just had a BM but states she feels like she can go more. Can she have an order for TED hose or SCD. She' s here for Breast CA with metz, and pleural effusion, looks like she had thoracentesis yesterday and 1.4 l was removed. Thanks.   Awaiting response

## 2024-01-09 NOTE — Progress Notes (Signed)
 Palliative Care Progress Note  Misty Arnold reports struggling with constipation today as well as right arm swelling and abdominal distention. She also reports that her skin is very dry. Generalized discomfort and weakness today. She remains on hydromorphone PCA-pump interrogation shows 6mg  total in past 24 hours, yesterday was 19mg . She is also getting 30mg  of oxycontin TID.We had a discussion about her PCA -it has been extremely helpful for managing her pain and she feels like she has better control over it with the PCA button.   The plan is to discharge home with hospice services in [place], she plans to go to her grandmothers home. TOC has initiated the referral process and we are still waiting to hear back from their liaison. We need to find out if they can do a CADD PCA pump.   So far no signs of recurring pleural fluid but abdomen is distended-?ascites vs. Constipation.  We discussed communication with her family and how to deliver the serious news about her cancer.  Plan:  Will give dose of Relistor X1 Maintain PCA at current dose Amlactin BID for skin dryness Await contact from [facility] in [place], [place] Palliative Medicine  Time: 30 minutes

## 2024-01-09 NOTE — Plan of Care (Signed)

## 2024-01-10 DIAGNOSIS — C7951 Secondary malignant neoplasm of bone: Secondary | ICD-10-CM | POA: Diagnosis not present

## 2024-01-10 DIAGNOSIS — M549 Dorsalgia, unspecified: Secondary | ICD-10-CM | POA: Diagnosis not present

## 2024-01-10 DIAGNOSIS — C50919 Malignant neoplasm of unspecified site of unspecified female breast: Secondary | ICD-10-CM | POA: Diagnosis not present

## 2024-01-10 DIAGNOSIS — R531 Weakness: Secondary | ICD-10-CM | POA: Diagnosis not present

## 2024-01-10 DIAGNOSIS — R627 Adult failure to thrive: Secondary | ICD-10-CM | POA: Diagnosis not present

## 2024-01-10 DIAGNOSIS — C799 Secondary malignant neoplasm of unspecified site: Secondary | ICD-10-CM | POA: Diagnosis not present

## 2024-01-10 DIAGNOSIS — E440 Moderate protein-calorie malnutrition: Secondary | ICD-10-CM | POA: Diagnosis not present

## 2024-01-10 DIAGNOSIS — G893 Neoplasm related pain (acute) (chronic): Secondary | ICD-10-CM | POA: Diagnosis not present

## 2024-01-10 MED ORDER — OXYCODONE HCL ER 40 MG PO T12A
40.0000 mg | EXTENDED_RELEASE_TABLET | Freq: Three times a day (TID) | ORAL | Status: DC
  Administered 2024-01-10 – 2024-02-17 (×108): 40 mg via ORAL
  Filled 2024-01-10 (×109): qty 1

## 2024-01-10 NOTE — Plan of Care (Signed)

## 2024-01-10 NOTE — Progress Notes (Signed)
 [facility]                                                                               Misty Arnold, is a 53 y.o. female, DOB - 1971/03/31, [id number] Admit date - 12/30/2023    Outpatient Primary MD for the patient is Serena Croissant, MD  LOS - 11  days    Brief summary    Misty Arnold is a 53 y.o. female with medical history significant for metastatic right breast cancer (s/p mastectomy, palliative radiation/chemotherapy, currently on Keytruda), chronic fungating right chest wall mass, spinal metastases (s/p T7-T10 decompressive laminectomy), chronic cancer associated pain, depression/anxiety who was sent directly from the cancer center to the ED for evaluation of uncontrolled cancer associated pain.  Oncology recommended hospitalization for pain management and palliative care consultation.    Assessment & Plan    Assessment and Plan:  Intractable pain from metastatic breast cancer: From diffuse metastatic disease.  Oncology does not recommend further treatment because of progression, worsening performance status and recommends hospice.  Palliative care consulted for pain management.  She was started on PCA for pain. Increase oxy for better control.    Right pleural effusion.  S/p thoracentesis on right side with removal of 1.4 lit.  Some sob earlier today. She remains on RA and did not want any further testing.   Hypertension BP parameters are well controlled.    Chronic metastatic right chest wall wound.  Wound care consulted with recommendations.     Anxiety and depression Continue with mirtazapine.    Generalized weakness and physical deconditioning.  Supportive care.  Hospice care.    Right upper and lower extremity swelling Offered to do venous duplex, patient politely refused the tests.  TED hoses ordered for comfort.    Moderate constipation:  Currently she is on senna and colace, along with miralax.  Prn lactulose added to  the regimen.   Anemia of chronic disease Hemoglobin around 10 and stable.   Independently reviewed labs   RN Pressure Injury Documentation: Pressure Injury 01/18/20 Coccyx Mid;Anterior Stage 2 -  Partial thickness loss of dermis presenting as a shallow open injury with a red, pink wound bed without slough. 2 small open areas that have 2 small open areas within them. (Active)  01/18/20 1830  Location: Coccyx  Location Orientation: Mid;Anterior  Staging: Stage 2 -  Partial thickness loss of dermis presenting as a shallow open injury with a red, pink wound bed without slough.  Wound Description (Comments): 2 small open areas that have 2 small open areas within them.  Present on Admission:     Malnutrition Type:  Nutrition Problem: Moderate Malnutrition Etiology: chronic illness (cancer and cancer related treatments)   Malnutrition Characteristics:  Signs/Symptoms: energy intake < 75% for > or equal to 1 month, moderate muscle depletion, moderate fat depletion, percent weight loss Percent weight loss: 10 % (in 4 months)   Nutrition Interventions:  Interventions: Boost Breeze, MVI, Snacks  Estimated body mass index is 19.97 kg/m as calculated from the following:   Height as of this encounter: 5\' 5"  (1.651 m).   Weight as of this encounter: 54.4 kg.  Code Status: DNR  DVT Prophylaxis:  Place TED hose Start: 01/09/24 1043 enoxaparin (LOVENOX) injection 40 mg Start: 01/01/24 1000   Level of Care: Level of care: Palliative Care Family Communication: none at bedside.   Disposition Plan:     Remains inpatient appropriate:  pending TOC.   Procedures:  None.   Consultants:   Palliative care  Antimicrobials:   Anti-infectives (From admission, onward)    None        Medications  Scheduled Meds:  ammonium lactate   Topical BID   atenolol  50 mg Oral BID   celecoxib  200 mg Oral QHS   dexamethasone  4 mg Oral Daily   enoxaparin (LOVENOX) injection  40 mg  Subcutaneous Q24H   feeding supplement  1 Container Oral TID BM   HYDROmorphone   Intravenous Q4H   mirtazapine  15 mg Oral QHS   multivitamins with iron  1 tablet Oral Daily   oxyCODONE  40 mg Oral Q8H   polyethylene glycol  17 g Oral BID   senna-docusate  1 tablet Oral BID   sodium chloride flush  10-40 mL Intracatheter Q12H   Continuous Infusions: PRN Meds:.acetaminophen **OR** acetaminophen, diphenhydrAMINE **OR** diphenhydrAMINE, HYDROmorphone (DILAUDID) injection, lactulose, LORazepam, LORazepam, metroNIDAZOLE, naloxone **AND** sodium chloride flush, ondansetron (ZOFRAN) IV    Subjective:   Misty Arnold was seen and examined today. No new complaints.  Had 2 bowel movements.    Objective:   Vitals:   01/10/24 0836 01/10/24 1217 01/10/24 1629 01/10/24 1755  BP:      Pulse:      Resp: 18 19 16 18   Temp:      TempSrc:      SpO2: 94% 94% 92% 93%  Weight:      Height:        Intake/Output Summary (Last 24 hours) at 01/10/2024 1911 Last data filed at 01/10/2024 1629 Gross per 24 hour  Intake 2290 ml  Output --  Net 2290 ml    Filed Weights   12/30/23 1220  Weight: 54.4 kg     Exam General exam: Appears calm and comfortable  Respiratory system: Clear to auscultation. Respiratory effort normal. Cardiovascular system: S1 & S2 heard, RRR.  Gastrointestinal system: Abdomen is soft BS+ Central nervous system: Alert and oriented. No focal neurological deficits. Extremities: Symmetric 5 x 5 power. Skin: No rashes, Psychiatry:  Mood & affect appropriate.      Data Reviewed:  I have personally reviewed following labs and imaging studies   CBC Lab Results  Component Value Date   WBC 14.4 (H) 01/04/2024   RBC 3.73 (L) 01/04/2024   HGB 10.1 (L) 01/04/2024   HCT 34.1 (L) 01/04/2024   MCV 91.4 01/04/2024   MCH 27.1 01/04/2024   PLT 486 (H) 01/04/2024   MCHC 29.6 (L) 01/04/2024   RDW 18.0 (H) 01/04/2024   LYMPHSABS 1.6 12/30/2023   MONOABS 0.9 12/30/2023    EOSABS 0.1 12/30/2023   BASOSABS 0.1 12/30/2023     Last metabolic panel Lab Results  Component Value Date   NA 140 01/04/2024   K 4.3 01/04/2024   CL 105 01/04/2024   CO2 29 01/04/2024   BUN 17 01/04/2024   CREATININE 0.92 01/04/2024   GLUCOSE 141 (H) 01/04/2024   GFRNONAA >60 01/04/2024   GFRAA >60 08/04/2020   CALCIUM 9.2 01/04/2024   PHOS 3.5 05/12/2021   PROT 7.0 12/31/2023   ALBUMIN 2.8 (L) 12/31/2023   BILITOT 0.5 12/31/2023   ALKPHOS 129 (H) 12/31/2023  AST 64 (H) 12/31/2023   ALT 31 12/31/2023   ANIONGAP 6 01/04/2024    CBG (last 3)  No results for input(s): "GLUCAP" in the last 72 hours.    Coagulation Profile: No results for input(s): "INR", "PROTIME" in the last 168 hours.   Radiology Studies: No results found.     Kathlen Mody M.D. [facility] 01/10/2024, 7:11 PM  Available via Epic secure chat 7am-7pm After 7 pm, please refer to night coverage provider listed on amion.

## 2024-01-10 NOTE — Progress Notes (Signed)
 Patient had increased pain this shift. Palliative increased Oxy dosage. Otherwise no acute changes this shift, she remains alert and oriented, makes needs known, only needs set up assistance with ADLs, educated to continue to use PCA as often as needed, in bed resting, call light in reach

## 2024-01-11 DIAGNOSIS — M549 Dorsalgia, unspecified: Secondary | ICD-10-CM | POA: Diagnosis not present

## 2024-01-11 DIAGNOSIS — G893 Neoplasm related pain (acute) (chronic): Secondary | ICD-10-CM | POA: Diagnosis not present

## 2024-01-11 DIAGNOSIS — C7951 Secondary malignant neoplasm of bone: Secondary | ICD-10-CM | POA: Diagnosis not present

## 2024-01-11 DIAGNOSIS — C50919 Malignant neoplasm of unspecified site of unspecified female breast: Secondary | ICD-10-CM | POA: Diagnosis not present

## 2024-01-11 DIAGNOSIS — C799 Secondary malignant neoplasm of unspecified site: Secondary | ICD-10-CM | POA: Diagnosis not present

## 2024-01-11 DIAGNOSIS — E440 Moderate protein-calorie malnutrition: Secondary | ICD-10-CM | POA: Diagnosis not present

## 2024-01-11 DIAGNOSIS — R531 Weakness: Secondary | ICD-10-CM | POA: Diagnosis not present

## 2024-01-11 DIAGNOSIS — R627 Adult failure to thrive: Secondary | ICD-10-CM | POA: Diagnosis not present

## 2024-01-11 NOTE — Progress Notes (Signed)
 [facility]                                                                               Misty Arnold, is a 53 y.o. female, DOB - [birth year], ZHY:[id number] Admit date - 12/30/2023    Outpatient Primary MD for the patient is Serena Croissant, MD  LOS - 12  days    Brief summary    Misty Arnold is a 53 y.o. female with medical history significant for metastatic right breast cancer (s/p mastectomy, palliative radiation/chemotherapy, currently on Keytruda), chronic fungating right chest wall mass, spinal metastases (s/p T7-T10 decompressive laminectomy), chronic cancer associated pain, depression/anxiety who was sent directly from the cancer center to the ED for evaluation of uncontrolled cancer associated pain.  Oncology recommended hospitalization for pain management and palliative care consultation.    Assessment & Plan    Assessment and Plan:  Intractable pain from metastatic breast cancer: From diffuse metastatic disease.  Oncology does not recommend further treatment because of progression, worsening performance status and recommends hospice.  Palliative care consulted for pain management.  She was started on PCA for pain. Increase oxy for better control.  No new complaints today.    Right pleural effusion.  S/p thoracentesis on right side with removal of 1.4 lit.  Some sob earlier today. She remains on RA and did not want any further testing.   Hypertension BP parameters are optimal.     Chronic metastatic right chest wall wound.  Wound care consulted with recommendations given.  Patient doing the wound care by herself.     Anxiety and depression Continue with mirtazapine.    Generalized weakness and physical deconditioning.  Supportive care.  Hospice care.    Right upper and lower extremity swelling Offered to do venous duplex, patient politely refused the tests.  TED hoses ordered for comfort.    Moderate constipation:  Currently she  is on senna and colace, along with miralax.  Prn lactulose added to the regimen.   Anemia of chronic disease Hemoglobin around 10 and stable.  No new lab work today.    RN Pressure Injury Documentation: Pressure Injury 01/18/20 Coccyx Mid;Anterior Stage 2 -  Partial thickness loss of dermis presenting as a shallow open injury with a red, pink wound bed without slough. 2 small open areas that have 2 small open areas within them. (Active)  01/18/20 1830  Location: Coccyx  Location Orientation: Mid;Anterior  Staging: Stage 2 -  Partial thickness loss of dermis presenting as a shallow open injury with a red, pink wound bed without slough.  Wound Description (Comments): 2 small open areas that have 2 small open areas within them.  Present on Admission:     Malnutrition Type:  Nutrition Problem: Moderate Malnutrition Etiology: chronic illness (cancer and cancer related treatments)   Malnutrition Characteristics:  Signs/Symptoms: energy intake < 75% for > or equal to 1 month, moderate muscle depletion, moderate fat depletion, percent weight loss Percent weight loss: 10 % (in 4 months)   Nutrition Interventions:  Interventions: Boost Breeze, MVI, Snacks  Estimated body mass index is 19.97 kg/m as calculated from the following:   Height as of this encounter: 5'  5" (1.651 m).   Weight as of this encounter: 54.4 kg.  Code Status: DNR DVT Prophylaxis:  Place TED hose Start: 01/09/24 1043 enoxaparin (LOVENOX) injection 40 mg Start: 01/01/24 1000   Level of Care: Level of care: Palliative Care Family Communication: none at bedside.   Disposition Plan:     Remains inpatient appropriate:  pending TOC.   Procedures:  None.   Consultants:   Palliative care  Antimicrobials:   Anti-infectives (From admission, onward)    None        Medications  Scheduled Meds:  ammonium lactate   Topical BID   atenolol  50 mg Oral BID   celecoxib  200 mg Oral QHS   dexamethasone  4  mg Oral Daily   enoxaparin (LOVENOX) injection  40 mg Subcutaneous Q24H   feeding supplement  1 Container Oral TID BM   HYDROmorphone   Intravenous Q4H   mirtazapine  15 mg Oral QHS   multivitamins with iron  1 tablet Oral Daily   oxyCODONE  40 mg Oral Q8H   polyethylene glycol  17 g Oral BID   senna-docusate  1 tablet Oral BID   Continuous Infusions: PRN Meds:.acetaminophen **OR** acetaminophen, diphenhydrAMINE **OR** diphenhydrAMINE, HYDROmorphone (DILAUDID) injection, lactulose, LORazepam, LORazepam, metroNIDAZOLE, naloxone **AND** sodium chloride flush, ondansetron (ZOFRAN) IV    Subjective:   Misty Arnold was seen and examined today. No new complaints.    Objective:   Vitals:   01/11/24 0500 01/11/24 0612 01/11/24 0844 01/11/24 1240  BP:  118/72    Pulse:  80    Resp: 18 16 16 15   Temp:  97.7 F (36.5 C)    TempSrc:  Oral    SpO2:  95% 95% 95%  Weight:      Height:        Intake/Output Summary (Last 24 hours) at 01/11/2024 1323 Last data filed at 01/10/2024 1629 Gross per 24 hour  Intake 1200 ml  Output --  Net 1200 ml    Filed Weights   12/30/23 1220  Weight: 54.4 kg     Exam General exam: Appears calm and comfortable  Respiratory system: Clear to auscultation. Respiratory effort normal. Cardiovascular system: S1 & S2 heard, RRR.  Gastrointestinal system: Abdomen is soft bs+ Central nervous system: Alert and oriented.  Extremities: Symmetric 5 x 5 power. Skin: chest wall wound bandaged and wrapped.  Psychiatry: Mood & affect appropriate.       Data Reviewed:  I have personally reviewed following labs and imaging studies   CBC Lab Results  Component Value Date   WBC 14.4 (H) 01/04/2024   RBC 3.73 (L) 01/04/2024   HGB 10.1 (L) 01/04/2024   HCT 34.1 (L) 01/04/2024   MCV 91.4 01/04/2024   MCH 27.1 01/04/2024   PLT 486 (H) 01/04/2024   MCHC 29.6 (L) 01/04/2024   RDW 18.0 (H) 01/04/2024   LYMPHSABS 1.6 12/30/2023   MONOABS 0.9 12/30/2023    EOSABS 0.1 12/30/2023   BASOSABS 0.1 12/30/2023     Last metabolic panel Lab Results  Component Value Date   NA 140 01/04/2024   K 4.3 01/04/2024   CL 105 01/04/2024   CO2 29 01/04/2024   BUN 17 01/04/2024   CREATININE 0.92 01/04/2024   GLUCOSE 141 (H) 01/04/2024   GFRNONAA >60 01/04/2024   GFRAA >60 08/04/2020   CALCIUM 9.2 01/04/2024   PHOS 3.5 05/12/2021   PROT 7.0 12/31/2023   ALBUMIN 2.8 (L) 12/31/2023   BILITOT 0.5 12/31/2023  ALKPHOS 129 (H) 12/31/2023   AST 64 (H) 12/31/2023   ALT 31 12/31/2023   ANIONGAP 6 01/04/2024    CBG (last 3)  No results for input(s): "GLUCAP" in the last 72 hours.    Coagulation Profile: No results for input(s): "INR", "PROTIME" in the last 168 hours.   Radiology Studies: No results found.     Kathlen Mody M.D. [facility] 01/11/2024, 1:23 PM  Available via Epic secure chat 7am-7pm After 7 pm, please refer to night coverage provider listed on amion.

## 2024-01-12 DIAGNOSIS — C7951 Secondary malignant neoplasm of bone: Secondary | ICD-10-CM | POA: Diagnosis not present

## 2024-01-12 DIAGNOSIS — R627 Adult failure to thrive: Secondary | ICD-10-CM | POA: Diagnosis not present

## 2024-01-12 DIAGNOSIS — G893 Neoplasm related pain (acute) (chronic): Secondary | ICD-10-CM | POA: Diagnosis not present

## 2024-01-12 DIAGNOSIS — R531 Weakness: Secondary | ICD-10-CM | POA: Diagnosis not present

## 2024-01-12 DIAGNOSIS — C50919 Malignant neoplasm of unspecified site of unspecified female breast: Secondary | ICD-10-CM | POA: Diagnosis not present

## 2024-01-12 DIAGNOSIS — M549 Dorsalgia, unspecified: Secondary | ICD-10-CM | POA: Diagnosis not present

## 2024-01-12 DIAGNOSIS — E440 Moderate protein-calorie malnutrition: Secondary | ICD-10-CM | POA: Diagnosis not present

## 2024-01-12 DIAGNOSIS — C799 Secondary malignant neoplasm of unspecified site: Secondary | ICD-10-CM | POA: Diagnosis not present

## 2024-01-12 NOTE — Plan of Care (Signed)

## 2024-01-12 NOTE — Progress Notes (Signed)
 [facility]                                                                               Misty Arnold, is a 53 y.o. female, DOB - [birth year], [id number] Admit date - 12/30/2023    Outpatient Primary MD for the patient is Serena Croissant, MD  LOS - 13  days    Brief summary    Misty Arnold is a 53 y.o. female with medical history significant for metastatic right breast cancer (s/p mastectomy, palliative radiation/chemotherapy, currently on Keytruda), chronic fungating right chest wall mass, spinal metastases (s/p T7-T10 decompressive laminectomy), chronic cancer associated pain, depression/anxiety who was sent directly from the cancer center to the ED for evaluation of uncontrolled cancer associated pain.  Oncology recommended hospitalization for pain management and palliative care consultation.    Assessment & Plan    Assessment and Plan:  Intractable pain from metastatic breast cancer: From diffuse metastatic disease.  Oncology does not recommend further treatment because of progression, worsening performance status and recommends hospice.  Palliative care consulted for pain management.  She was started on PCA for pain. Increase oxy for better control.  No new complaints today.    Right pleural effusion.  S/p thoracentesis on right side with removal of 1.4 lit.  Some sob earlier today. She remains on RA and did not want any further testing.   Hypertension BP parameters are well controlled.    Chronic metastatic right chest wall wound.  Wound care consulted with recommendations given.  Patient doing the wound care by herself.     Anxiety and depression Continue with mirtazapine.    Generalized weakness and physical deconditioning.  Supportive care.  Hospice care.    Right upper and lower extremity swelling Offered to do venous duplex, patient politely refused the tests.  TED hoses ordered for comfort.    Moderate constipation:   Currently she is on senna and colace, along with miralax.  Prn lactulose added to the regimen.   Anemia of chronic disease Hemoglobin around 10 and stable.  No new lab work today.    RN Pressure Injury Documentation: Pressure Injury 01/18/20 Coccyx Mid;Anterior Stage 2 -  Partial thickness loss of dermis presenting as a shallow open injury with a red, pink wound bed without slough. 2 small open areas that have 2 small open areas within them. (Active)  01/18/20 1830  Location: Coccyx  Location Orientation: Mid;Anterior  Staging: Stage 2 -  Partial thickness loss of dermis presenting as a shallow open injury with a red, pink wound bed without slough.  Wound Description (Comments): 2 small open areas that have 2 small open areas within them.  Present on Admission:     Malnutrition Type:  Nutrition Problem: Moderate Malnutrition Etiology: chronic illness (cancer and cancer related treatments)   Malnutrition Characteristics:  Signs/Symptoms: energy intake < 75% for > or equal to 1 month, moderate muscle depletion, moderate fat depletion, percent weight loss Percent weight loss: 10 % (in 4 months)   Nutrition Interventions:  Interventions: Boost Breeze, MVI, Snacks  Estimated body mass index is 19.97 kg/m as calculated from the following:   Height as of this encounter: 5'  5" (1.651 m).   Weight as of this encounter: 54.4 kg.  Code Status: DNR DVT Prophylaxis:  Place TED hose Start: 01/09/24 1043 enoxaparin (LOVENOX) injection 40 mg Start: 01/01/24 1000   Level of Care: Level of care: Palliative Care Family Communication: none at bedside.   Disposition Plan:     Remains inpatient appropriate:  pending TOC.   Procedures:  None.   Consultants:   Palliative care  Antimicrobials:   Anti-infectives (From admission, onward)    None        Medications  Scheduled Meds:  ammonium lactate   Topical BID   atenolol  50 mg Oral BID   celecoxib  200 mg Oral QHS    dexamethasone  4 mg Oral Daily   enoxaparin (LOVENOX) injection  40 mg Subcutaneous Q24H   feeding supplement  1 Container Oral TID BM   HYDROmorphone   Intravenous Q4H   mirtazapine  15 mg Oral QHS   multivitamins with iron  1 tablet Oral Daily   oxyCODONE  40 mg Oral Q8H   polyethylene glycol  17 g Oral BID   senna-docusate  1 tablet Oral BID   Continuous Infusions: PRN Meds:.acetaminophen **OR** acetaminophen, diphenhydrAMINE **OR** diphenhydrAMINE, HYDROmorphone (DILAUDID) injection, lactulose, LORazepam, LORazepam, metroNIDAZOLE, naloxone **AND** sodium chloride flush, ondansetron (ZOFRAN) IV    Subjective:   [name] was seen and examined today. No new complaints.    Objective:   Vitals:   01/12/24 1322 01/12/24 1337 01/12/24 1339 01/12/24 1545  BP: 136/83     Pulse: (!) 102     Resp: 18 18 18 18   Temp: 97.9 F (36.6 C)     TempSrc: Oral     SpO2: 95%     Weight:      Height:       No intake or output data in the 24 hours ending 01/12/24 1740   Filed Weights   12/30/23 1220  Weight: 54.4 kg     Exam General exam: Appears calm and comfortable  Respiratory system: Clear to auscultation. Respiratory effort normal. Cardiovascular system: S1 & S2 heard, RRR.  Gastrointestinal system: Abdomen is nondistended, soft and nontender. Central nervous system: Alert and oriented. No focal neurological deficits. Extremities: no pedal edema.  Skin: right chest wall wound  Psychiatry:  Mood & affect appropriate.        Data Reviewed:  I have personally reviewed following labs and imaging studies   CBC Lab Results  Component Value Date   WBC 14.4 (H) 01/04/2024   RBC 3.73 (L) 01/04/2024   HGB 10.1 (L) 01/04/2024   HCT 34.1 (L) 01/04/2024   MCV 91.4 01/04/2024   MCH 27.1 01/04/2024   PLT 486 (H) 01/04/2024   MCHC 29.6 (L) 01/04/2024   RDW 18.0 (H) 01/04/2024   LYMPHSABS 1.6 12/30/2023   MONOABS 0.9 12/30/2023   EOSABS 0.1 12/30/2023   BASOSABS 0.1  12/30/2023     Last metabolic panel Lab Results  Component Value Date   NA 140 01/04/2024   K 4.3 01/04/2024   CL 105 01/04/2024   CO2 29 01/04/2024   BUN 17 01/04/2024   CREATININE 0.92 01/04/2024   GLUCOSE 141 (H) 01/04/2024   GFRNONAA >60 01/04/2024   GFRAA >60 08/04/2020   CALCIUM 9.2 01/04/2024   PHOS 3.5 05/12/2021   PROT 7.0 12/31/2023   ALBUMIN 2.8 (L) 12/31/2023   BILITOT 0.5 12/31/2023   ALKPHOS 129 (H) 12/31/2023   AST 64 (H) 12/31/2023   ALT  31 12/31/2023   ANIONGAP 6 01/04/2024    CBG (last 3)  No results for input(s): "GLUCAP" in the last 72 hours.    Coagulation Profile: No results for input(s): "INR", "PROTIME" in the last 168 hours.   Radiology Studies: No results found.     Kathlen Mody M.D. [facility] 01/12/2024, 5:40 PM  Available via Epic secure chat 7am-7pm After 7 pm, please refer to night coverage provider listed on amion.

## 2024-01-13 ENCOUNTER — Inpatient Hospital Stay (HOSPITAL_COMMUNITY)

## 2024-01-13 DIAGNOSIS — Z515 Encounter for palliative care: Secondary | ICD-10-CM

## 2024-01-13 DIAGNOSIS — E440 Moderate protein-calorie malnutrition: Secondary | ICD-10-CM | POA: Diagnosis not present

## 2024-01-13 DIAGNOSIS — G893 Neoplasm related pain (acute) (chronic): Secondary | ICD-10-CM | POA: Diagnosis not present

## 2024-01-13 DIAGNOSIS — R531 Weakness: Secondary | ICD-10-CM | POA: Diagnosis not present

## 2024-01-13 DIAGNOSIS — C7951 Secondary malignant neoplasm of bone: Secondary | ICD-10-CM | POA: Diagnosis not present

## 2024-01-13 DIAGNOSIS — C50919 Malignant neoplasm of unspecified site of unspecified female breast: Secondary | ICD-10-CM | POA: Diagnosis not present

## 2024-01-13 DIAGNOSIS — R627 Adult failure to thrive: Secondary | ICD-10-CM | POA: Diagnosis not present

## 2024-01-13 DIAGNOSIS — M549 Dorsalgia, unspecified: Secondary | ICD-10-CM | POA: Diagnosis not present

## 2024-01-13 DIAGNOSIS — C799 Secondary malignant neoplasm of unspecified site: Secondary | ICD-10-CM | POA: Diagnosis not present

## 2024-01-13 LAB — CBC
HCT: 35.3 % — ABNORMAL LOW (ref 36.0–46.0)
Hemoglobin: 10.3 g/dL — ABNORMAL LOW (ref 12.0–15.0)
MCH: 26.7 pg (ref 26.0–34.0)
MCHC: 29.2 g/dL — ABNORMAL LOW (ref 30.0–36.0)
MCV: 91.5 fL (ref 80.0–100.0)
Platelets: 515 10*3/uL — ABNORMAL HIGH (ref 150–400)
RBC: 3.86 MIL/uL — ABNORMAL LOW (ref 3.87–5.11)
RDW: 19.6 % — ABNORMAL HIGH (ref 11.5–15.5)
WBC: 10.8 10*3/uL — ABNORMAL HIGH (ref 4.0–10.5)
nRBC: 0.3 % — ABNORMAL HIGH (ref 0.0–0.2)

## 2024-01-13 LAB — COMPREHENSIVE METABOLIC PANEL
ALT: 41 U/L (ref 0–44)
AST: 79 U/L — ABNORMAL HIGH (ref 15–41)
Albumin: 2.8 g/dL — ABNORMAL LOW (ref 3.5–5.0)
Alkaline Phosphatase: 281 U/L — ABNORMAL HIGH (ref 38–126)
Anion gap: 9 (ref 5–15)
BUN: 13 mg/dL (ref 6–20)
CO2: 28 mmol/L (ref 22–32)
Calcium: 9.4 mg/dL (ref 8.9–10.3)
Chloride: 105 mmol/L (ref 98–111)
Creatinine, Ser: 0.84 mg/dL (ref 0.44–1.00)
GFR, Estimated: >60 mL/min (ref >60)
Glucose, Bld: 160 mg/dL — ABNORMAL HIGH (ref 70–99)
Potassium: 3.9 mmol/L (ref 3.5–5.1)
Sodium: 142 mmol/L (ref 135–145)
Total Bilirubin: 0.4 mg/dL (ref 0.0–1.2)
Total Protein: 7 g/dL (ref 6.5–8.1)

## 2024-01-13 MED ORDER — MAGNESIUM OXIDE -MG SUPPLEMENT 400 (240 MG) MG PO TABS
400.0000 mg | ORAL_TABLET | Freq: Every day | ORAL | Status: DC
  Administered 2024-01-14 – 2024-01-23 (×10): 400 mg via ORAL
  Filled 2024-01-13 (×10): qty 1

## 2024-01-13 NOTE — Progress Notes (Signed)
 [facility]                                                                               Misty Arnold, is a 53 y.o. female, DOB - [birth year], VHQ:[id number] Admit date - 12/30/2023    Outpatient Primary MD for the patient is Misty Croissant, MD  LOS - 14  days    Brief summary    Misty Arnold is a 53 y.o. female with medical history significant for metastatic right breast cancer (s/p mastectomy, palliative radiation/chemotherapy, currently on Keytruda), chronic fungating right chest wall mass, spinal metastases (s/p T7-T10 decompressive laminectomy), chronic cancer associated pain, depression/anxiety who was sent directly from the cancer center to the ED for evaluation of uncontrolled cancer associated pain.  Oncology recommended hospitalization for pain management and palliative care consultation.    Assessment & Plan    Assessment and Plan:  Intractable pain from metastatic breast cancer: From diffuse metastatic disease.  Oncology does not recommend further treatment because of progression, worsening performance status and recommends hospice.  Palliative care consulted for pain management.  She was started on PCA for pain. Increase oxy for better control.  No new complaints today.    Right pleural effusion.  S/p thoracentesis on right side with removal of 1.4 lit.  No new complaints.   Hypertension BP parameters are optimal.   Chronic metastatic right chest wall wound.  Wound care consulted with recommendations given.  Patient doing the wound care by herself.     Anxiety and depression Continue with mirtazapine.    Generalized weakness and physical deconditioning.  Supportive care.  Hospice care.    Right upper and lower extremity swelling Offered to do venous duplex, patient politely refused the tests.  TED hoses ordered for comfort.    Moderate constipation:  Currently she is on senna and colace, along with miralax.  Prn lactulose  added to the regimen.   Anemia of chronic disease Hemoglobin around 10 and stable.  No new lab work today.    RN Pressure Injury Documentation: Pressure Injury 01/18/20 Coccyx Mid;Anterior Stage 2 -  Partial thickness loss of dermis presenting as a shallow open injury with a red, pink wound bed without slough. 2 small open areas that have 2 small open areas within them. (Active)  01/18/20 1830  Location: Coccyx  Location Orientation: Mid;Anterior  Staging: Stage 2 -  Partial thickness loss of dermis presenting as a shallow open injury with a red, pink wound bed without slough.  Wound Description (Comments): 2 small open areas that have 2 small open areas within them.  Present on Admission:     Malnutrition Type:  Nutrition Problem: Moderate Malnutrition Etiology: chronic illness (cancer and cancer related treatments)   Malnutrition Characteristics:  Signs/Symptoms: energy intake < 75% for > or equal to 1 month, moderate muscle depletion, moderate fat depletion, percent weight loss Percent weight loss: 10 % (in 4 months)   Nutrition Interventions:  Interventions: Boost Breeze, MVI, Snacks  Estimated body mass index is 19.97 kg/m as calculated from the following:   Height as of this encounter: 5\' 5"  (1.651 m).   Weight as of this encounter: 54.4 kg.  Code  Status: DNR DVT Prophylaxis:  Place TED hose Start: 01/09/24 1043 enoxaparin (LOVENOX) injection 40 mg Start: 01/01/24 1000   Level of Care: Level of care: Palliative Care Family Communication: none at bedside.   Disposition Plan:     Remains inpatient appropriate:  pending TOC.   Procedures:  None.   Consultants:   Palliative care  Antimicrobials:   Anti-infectives (From admission, onward)    None        Medications  Scheduled Meds:  ammonium lactate   Topical BID   atenolol  50 mg Oral BID   celecoxib  200 mg Oral QHS   dexamethasone  4 mg Oral Daily   enoxaparin (LOVENOX) injection  40 mg  Subcutaneous Q24H   feeding supplement  1 Container Oral TID BM   HYDROmorphone   Intravenous Q4H   mirtazapine  15 mg Oral QHS   multivitamins with iron  1 tablet Oral Daily   oxyCODONE  40 mg Oral Q8H   polyethylene glycol  17 g Oral BID   senna-docusate  1 tablet Oral BID   Continuous Infusions: PRN Meds:.acetaminophen **OR** acetaminophen, diphenhydrAMINE **OR** diphenhydrAMINE, HYDROmorphone (DILAUDID) injection, lactulose, LORazepam, LORazepam, metroNIDAZOLE, naloxone **AND** sodium chloride flush, ondansetron (ZOFRAN) IV    Subjective:   Brenetta Penny was seen and examined today. No new complaints.    Objective:   Vitals:   01/13/24 0714 01/13/24 1345 01/13/24 1349 01/13/24 1620  BP:  120/79    Pulse:  97    Resp: 16 16 18 16   Temp:  98.4 F (36.9 C)    TempSrc:  Oral    SpO2:  98%    Weight:      Height:       No intake or output data in the 24 hours ending 01/13/24 1821   Filed Weights   12/30/23 1220  Weight: 54.4 kg     Exam General exam: Appears calm and comfortable  Respiratory system: Clear to auscultation. Respiratory effort normal. Cardiovascular system: S1 & S2 heard, RRR. No JVD, Gastrointestinal system: Abdomen is nondistended, soft and nontender.  Central nervous system: Alert and oriented.  Extremities: Symmetric 5 x 5 power. Skin: chest wall wound bandaged.  Psychiatry:Mood & affect appropriate.         Data Reviewed:  I have personally reviewed following labs and imaging studies   CBC Lab Results  Component Value Date   WBC 10.8 (H) 01/13/2024   RBC 3.86 (L) 01/13/2024   HGB 10.3 (L) 01/13/2024   HCT 35.3 (L) 01/13/2024   MCV 91.5 01/13/2024   MCH 26.7 01/13/2024   PLT 515 (H) 01/13/2024   MCHC 29.2 (L) 01/13/2024   RDW 19.6 (H) 01/13/2024   LYMPHSABS 1.6 12/30/2023   MONOABS 0.9 12/30/2023   EOSABS 0.1 12/30/2023   BASOSABS 0.1 12/30/2023     Last metabolic panel Lab Results  Component Value Date   NA 142  01/13/2024   K 3.9 01/13/2024   CL 105 01/13/2024   CO2 28 01/13/2024   BUN 13 01/13/2024   CREATININE 0.84 01/13/2024   GLUCOSE 160 (H) 01/13/2024   GFRNONAA >60 01/13/2024   GFRAA >60 08/04/2020   CALCIUM 9.4 01/13/2024   PHOS 3.5 05/12/2021   PROT 7.0 01/13/2024   ALBUMIN 2.8 (L) 01/13/2024   BILITOT 0.4 01/13/2024   ALKPHOS 281 (H) 01/13/2024   AST 79 (H) 01/13/2024   ALT 41 01/13/2024   ANIONGAP 9 01/13/2024    CBG (last 3)  No results  for input(s): "GLUCAP" in the last 72 hours.    Coagulation Profile: No results for input(s): "INR", "PROTIME" in the last 168 hours.   Radiology Studies: No results found.     Kathlen Mody M.D. [facility] 01/13/2024, 6:21 PM  Available via Epic secure chat 7am-7pm After 7 pm, please refer to night coverage provider listed on amion.

## 2024-01-13 NOTE — Progress Notes (Addendum)
 Palliative Care Progress Note  53 yo with metastatic breast cancer, now end stage and has exhausted all treatments available for her tumor type and condition. Complications of her breast cancer progression include a large malignant wound on her right chest wall, malignant pleural effusion s/p thoracentesis, thoracic and lumbar spine mets with previous pathologic fracture repair and radiation, liver mets and early signs of pleural/lung mets. Admitted with intractable cancer related pain.  Misty Arnold was resting comfortable in her room, mildly diaphoretic and slightly winded with speaking, her affect was at her baseline and she engaged with me well-she is able to articulate her needs about her medical care and choices.  Goal is Comfort and Relief of Suffering, Treat the Treatable when reasonable and possible.  Cancer Related Symptoms and Pain: Notable increase on PCA usage over the past 24 hours.  Maintain Hydromorphone PCA (Need Confirmation that Hospice can manage this and her complex needs related to her wound and cancer progression). Increased her oxycontin to 40 TID 2 days ago. Celebrex BID Decadron BID  Wound Care- needs supplies and reassurance that these will be provided-she does her own wound care and can spend over an hour meticulously cleaning it and dressing it. She uses AquaCell, ABDs, Telfa and Sterile Cotton swabs.  Constipation Now having very loose stools, laxative induced. Recommend using Relistor or Movantik an avoid high volume osmotics for her comfort. Hold laxative, PRN. Will start a magnesium supplement.  Malignant Effusion:  *Increasing Dyspnea-will obtain a chest xray -she may need another thoracentesis prior to discharge. Will get labs in case she needs a repeat thora.   Discharge Planning:  Going home [facility]) with hospice, complex care coordination needs. Plan of care discussed with hospitalist attending.  Anderson Malta, DO Palliative Medicine

## 2024-01-13 NOTE — Plan of Care (Signed)

## 2024-01-14 DIAGNOSIS — C50919 Malignant neoplasm of unspecified site of unspecified female breast: Secondary | ICD-10-CM | POA: Diagnosis not present

## 2024-01-14 DIAGNOSIS — C799 Secondary malignant neoplasm of unspecified site: Secondary | ICD-10-CM | POA: Diagnosis not present

## 2024-01-14 DIAGNOSIS — E440 Moderate protein-calorie malnutrition: Secondary | ICD-10-CM | POA: Diagnosis not present

## 2024-01-14 DIAGNOSIS — M549 Dorsalgia, unspecified: Secondary | ICD-10-CM | POA: Diagnosis not present

## 2024-01-14 DIAGNOSIS — R531 Weakness: Secondary | ICD-10-CM | POA: Diagnosis not present

## 2024-01-14 DIAGNOSIS — G893 Neoplasm related pain (acute) (chronic): Secondary | ICD-10-CM | POA: Diagnosis not present

## 2024-01-14 DIAGNOSIS — R627 Adult failure to thrive: Secondary | ICD-10-CM | POA: Diagnosis not present

## 2024-01-14 DIAGNOSIS — C7951 Secondary malignant neoplasm of bone: Secondary | ICD-10-CM | POA: Diagnosis not present

## 2024-01-14 NOTE — Progress Notes (Addendum)
 [facility]                                                                               Misty Arnold, is a 53 y.o. female, DOB - [birth year], ZOX:[id number] Admit date - 12/30/2023    Outpatient Primary MD for the patient is Serena Croissant, MD  LOS - 15  days    Brief summary    Misty Arnold is a 53 y.o. female with medical history significant for metastatic right breast cancer (s/p mastectomy, palliative radiation/chemotherapy, currently on Keytruda), chronic fungating right chest wall mass, spinal metastases (s/p T7-T10 decompressive laminectomy), chronic cancer associated pain, depression/anxiety who was sent directly from the cancer center to the ED for evaluation of uncontrolled cancer associated pain.  Oncology recommended hospitalization for pain management and palliative care consultation.  She is currently on dilaudid pCA, and toc working on [facility] agency to provide the Dilaudid PCA on discharge.    Assessment & Plan    Assessment and Plan:  Intractable pain from metastatic breast cancer: From diffuse metastatic disease.  Oncology does not recommend further treatment because of progression, worsening performance status and recommends hospice.  Palliative care consulted for pain management.  She was started on Dilaudid PCA for pain in ddition to the decadron, and oxycontin 40 mg every 8 hours.  No new complaints today.    Right pleural effusion.  S/p thoracentesis on right side with removal of 1.4 lit.  Some dyspnea, plan for thoracentesis prior to discharge.   Hypertension BP parameters are optimal. On atenolol 50 mg BID.   Chronic metastatic right chest wall wound.  Wound care consulted with recommendations given.  Patient doing the wound care by herself.     Anxiety and depression Continue with mirtazapine.    Generalized weakness and physical deconditioning.  Supportive care.  Hospice care.    Right upper and lower extremity  swelling Offered to do venous duplex, patient politely refused the tests.  TED hoses ordered for comfort.    Moderate constipation:  Currently she is on senna and colace, along with miralax.  Prn lactulose added to the regimen.   Anemia of chronic disease Hemoglobin around 10 and stable.  No new lab work today.    RN Pressure Injury Documentation: Pressure Injury 01/18/20 Coccyx Mid;Anterior Stage 2 -  Partial thickness loss of dermis presenting as a shallow open injury with a red, pink wound bed without slough. 2 small open areas that have 2 small open areas within them. (Active)  01/18/20 1830  Location: Coccyx  Location Orientation: Mid;Anterior  Staging: Stage 2 -  Partial thickness loss of dermis presenting as a shallow open injury with a red, pink wound bed without slough.  Wound Description (Comments): 2 small open areas that have 2 small open areas within them.  Present on Admission:     Malnutrition Type:  Nutrition Problem: Moderate Malnutrition Etiology: chronic illness (cancer and cancer related treatments)   Malnutrition Characteristics:  Signs/Symptoms: energy intake < 75% for > or equal to 1 month, moderate muscle depletion, moderate fat depletion, percent weight loss Percent weight loss: 10 % (in 4 months)   Nutrition Interventions:  Interventions: Boost  Breeze, MVI, Snacks  Estimated body mass index is 19.97 kg/m as calculated from the following:   Height as of this encounter: 5\' 5"  (1.651 m).   Weight as of this encounter: 54.4 kg.  Code Status: DNR DVT Prophylaxis:  Place TED hose Start: 01/09/24 1043 enoxaparin (LOVENOX) injection 40 mg Start: 01/01/24 1000   Level of Care: Level of care: Palliative Care Family Communication: none at bedside.   Disposition Plan:     Remains inpatient appropriate:  pending TOC.   Procedures:  None.   Consultants:   Palliative care Dr Phillips Odor.   Antimicrobials:   Anti-infectives (From admission, onward)     None        Medications  Scheduled Meds:  ammonium lactate   Topical BID   atenolol  50 mg Oral BID   celecoxib  200 mg Oral QHS   dexamethasone  4 mg Oral Daily   enoxaparin (LOVENOX) injection  40 mg Subcutaneous Q24H   feeding supplement  1 Container Oral TID BM   HYDROmorphone   Intravenous Q4H   magnesium oxide  400 mg Oral Daily   mirtazapine  15 mg Oral QHS   multivitamins with iron  1 tablet Oral Daily   oxyCODONE  40 mg Oral Q8H   polyethylene glycol  17 g Oral BID   senna-docusate  1 tablet Oral BID   Continuous Infusions: PRN Meds:.acetaminophen **OR** acetaminophen, diphenhydrAMINE **OR** diphenhydrAMINE, HYDROmorphone (DILAUDID) injection, lactulose, LORazepam, LORazepam, metroNIDAZOLE, naloxone **AND** sodium chloride flush, ondansetron (ZOFRAN) IV    Subjective:   Misty Arnold was seen and examined today. No new complaints.    Objective:   Vitals:   01/14/24 0939 01/14/24 1056 01/14/24 1228 01/14/24 1346  BP:  (!) 130/92  (!) 133/92  Pulse:  91  89  Resp: 16  18 16   Temp:    98 F (36.7 C)  TempSrc:    Oral  SpO2: 94% 94% 94% 99%  Weight:      Height:        Intake/Output Summary (Last 24 hours) at 01/14/2024 1741 Last data filed at 01/14/2024 1200 Gross per 24 hour  Intake 240 ml  Output --  Net 240 ml     Filed Weights   12/30/23 1220  Weight: 54.4 kg     Exam General exam: Appears calm and comfortable  Respiratory system: diminished air entry on the right.  Cardiovascular system: S1 & S2 heard, RRR.  Gastrointestinal system: Abdomen is soft bs+ Central nervous system: Alert and oriented. No focal neurological deficits. Extremities: trace edema on the lower extremities.  Skin: chest wound.  Psychiatry:  Mood & affect appropriate.          Data Reviewed:  I have personally reviewed following labs and imaging studies   CBC Lab Results  Component Value Date   WBC 10.8 (H) 01/13/2024   RBC 3.86 (L) 01/13/2024    HGB 10.3 (L) 01/13/2024   HCT 35.3 (L) 01/13/2024   MCV 91.5 01/13/2024   MCH 26.7 01/13/2024   PLT 515 (H) 01/13/2024   MCHC 29.2 (L) 01/13/2024   RDW 19.6 (H) 01/13/2024   LYMPHSABS 1.6 12/30/2023   MONOABS 0.9 12/30/2023   EOSABS 0.1 12/30/2023   BASOSABS 0.1 12/30/2023     Last metabolic panel Lab Results  Component Value Date   NA 142 01/13/2024   K 3.9 01/13/2024   CL 105 01/13/2024   CO2 28 01/13/2024   BUN 13 01/13/2024   CREATININE  0.84 01/13/2024   GLUCOSE 160 (H) 01/13/2024   GFRNONAA >60 01/13/2024   GFRAA >60 08/04/2020   CALCIUM 9.4 01/13/2024   PHOS 3.5 05/12/2021   PROT 7.0 01/13/2024   ALBUMIN 2.8 (L) 01/13/2024   BILITOT 0.4 01/13/2024   ALKPHOS 281 (H) 01/13/2024   AST 79 (H) 01/13/2024   ALT 41 01/13/2024   ANIONGAP 9 01/13/2024    CBG (last 3)  No results for input(s): "GLUCAP" in the last 72 hours.    Coagulation Profile: No results for input(s): "INR", "PROTIME" in the last 168 hours.   Radiology Studies: DG Chest 1 View Result Date: 01/13/2024 CLINICAL DATA:  Malignant pleural effusion. EXAM: CHEST  1 VIEW COMPARISON:  Chest radiograph and CT 01/01/2024. FINDINGS: Right pleural effusion is at least moderate in size and tracks laterally, increased from prior exam. Progressive opacity projecting over the mid right hemithorax may be related to overlying chest wall mass versus increasing pleural effusion. Stable heart size and mediastinal contours. Destruction of right ribs again seen. No pneumothorax. No focal left lung abnormality. Thoracic fusion hardware. IMPRESSION: 1. Increased right pleural effusion, at least moderate in size. 2. Progressive opacity projecting over the mid right hemithorax may be related to overlying chest wall mass versus increasing pleural effusion. Electronically Signed   By: Narda Rutherford M.D.   On: 01/13/2024 19:42       Kathlen Mody M.D. [facility] 01/14/2024, 5:41 PM  Available via Epic secure chat  7am-7pm After 7 pm, please refer to night coverage provider listed on amion.

## 2024-01-14 NOTE — Plan of Care (Signed)

## 2024-01-14 NOTE — TOC Progression Note (Signed)
 Transition of Care [facility]) - Progression Note    Patient Details  Name: Misty Arnold MRN: [id number] Date of Birth: 04/14/1971  Transition of Care [facility]) CM/SW Contact  Beckie Busing, RN Phone [phone]  01/14/2024, 10:35 AM  Clinical Narrative:    [name] with [name] has agreed that [facility] can accept referral. All paperwork has not been officially submitted because with [name] spoke with patient the patient made [name] aware that patient would not be discharging before a another month in the hospital. [facility] can submit information and accept there referral as soon as patient is medically ready for discharge.         Expected Discharge Plan and Services                                               Social Determinants of Health (SDOH) Interventions SDOH Screenings   Food Insecurity: No Food Insecurity (12/30/2023)  Recent Concern: Food Insecurity - Food Insecurity Present (12/11/2023)  Housing: High Risk (12/30/2023)  Transportation Needs: Unmet Transportation Needs (12/30/2023)  Utilities: Not At Risk (12/30/2023)  Depression (PHQ2-9): Medium Risk (02/15/2020)  Social Connections: Moderately Integrated (12/30/2023)  Tobacco Use: Low Risk  (12/30/2023)    Readmission Risk Interventions    01/01/2024    9:51 AM 10/12/2021    3:40 PM  Readmission Risk Prevention Plan  Transportation Screening Complete Complete  PCP or Specialist Appt within 5-7 Days Complete   PCP or Specialist Appt within 3-5 Days  Complete  Home Care Screening Complete   Medication Review (RN CM) Complete   HRI or Home Care Consult  Complete  Social Work Consult for Recovery Care Planning/Counseling  Complete  Medication Review Oceanographer)  Complete

## 2024-01-15 ENCOUNTER — Inpatient Hospital Stay (HOSPITAL_COMMUNITY)

## 2024-01-15 DIAGNOSIS — Z48813 Encounter for surgical aftercare following surgery on the respiratory system: Secondary | ICD-10-CM | POA: Diagnosis not present

## 2024-01-15 DIAGNOSIS — G893 Neoplasm related pain (acute) (chronic): Secondary | ICD-10-CM | POA: Diagnosis not present

## 2024-01-15 MED ORDER — LIDOCAINE HCL 1 % IJ SOLN
INTRAMUSCULAR | Status: AC
  Filled 2024-01-15: qty 20

## 2024-01-15 NOTE — Plan of Care (Signed)
  Problem: Clinical Measurements: Goal: Will remain free from infection Outcome: Progressing   Problem: Elimination: Goal: Will not experience complications related to bowel motility Outcome: Progressing   Problem: Pain Managment: Goal: General experience of comfort will improve and/or be controlled Outcome: Progressing   Problem: Safety: Goal: Ability to remain free from injury will improve Outcome: Progressing

## 2024-01-15 NOTE — TOC Progression Note (Signed)
 Transition of Care [facility]) - Progression Note    Patient Details  Name: Misty Arnold MRN: [id number] Date of Birth: Oct 11, 1971  [facility]) CM/SW Contact  Beckie Busing, RN Phone Number:[phone]  01/15/2024, 7:02 PM  Clinical Narrative:    Cm received a message from [facility] with [facility] stating that [facility] due to staffing and inability to service her area. TOC will attempt to follow up with other Hospice agencies that can service the [place] area.        Expected Discharge Plan and Services                                               Social Determinants of Health (SDOH) Interventions SDOH Screenings   Food Insecurity: No Food Insecurity (12/30/2023)  Recent Concern: Food Insecurity - Food Insecurity Present (12/11/2023)  Housing: High Risk (12/30/2023)  Transportation Needs: Unmet Transportation Needs (12/30/2023)  Utilities: Not At Risk (12/30/2023)  Depression (PHQ2-9): Medium Risk (02/15/2020)  Social Connections: Moderately Integrated (12/30/2023)  Tobacco Use: Low Risk  (12/30/2023)    Readmission Risk Interventions    01/01/2024    9:51 AM 10/12/2021    3:40 PM  Readmission Risk Prevention Plan  Transportation Screening Complete Complete  PCP or Specialist Appt within 5-7 Days Complete   PCP or Specialist Appt within 3-5 Days  Complete  Home Care Screening Complete   Medication Review (RN CM) Complete   HRI or Home Care Consult  Complete  Social Work Consult for Recovery Care Planning/Counseling  Complete  Medication Review Oceanographer)  Complete

## 2024-01-15 NOTE — Progress Notes (Signed)
 [facility] Progress Note  Patient: Misty Arnold     KZS:[id number]  DOA: 12/30/2023   PCP: Serena Croissant, MD       Brief hospital course: This is a 53 year old female with right sided metastatic breast cancer status post palliative chemo, radiation and mastectomy, chronic pain secondary to cancer, depression and anxiety who was admitted for uncontrolled pain.  Subjective:  She complains of pain in her back.  Assessment and Plan: Principal Problem:   Cancer associated pain -Currently on Dilaudid PCA  Active Problems:    Metastasis from breast cancer (HCC) - fungating mass across her right chest wall encroaching into her left chest wall - Has extensive bony metastasis, mets to the liver and lungs - cont decadron   Right pleural effusion - Status post 1.4 L removal via thoracentesis on 2/26  Underweigt Body mass index is 19.97 kg/m.     Code Status: Limited: Do not attempt resuscitation (DNR) -DNR-LIMITED -Do Not Intubate/DNI  Total time on patient care: 35 minutes. DVT prophylaxis:  Place TED hose Start: 01/09/24 1043 enoxaparin (LOVENOX) injection 40 mg Start: 01/01/24 1000     Objective:   Vitals:   01/15/24 0013 01/15/24 0429 01/15/24 0604 01/15/24 0754  BP:  113/76    Pulse:  74    Resp: 16 14 14 16   Temp:  97.8 F (36.6 C)    TempSrc:  Oral    SpO2: 99% 98% 98% 96%  Weight:      Height:       Filed Weights   12/30/23 1220  Weight: 54.4 kg   Exam: General exam: Appears comfortable  HEENT: oral mucosa moist Respiratory system: Clear to auscultation.  Chest wall: Extensive fungating necrotic mass on right chest wall and part of left chest wall Cardiovascular system: S1 & S2 heard  Gastrointestinal system: Abdomen soft, non-tender, nondistended. Normal bowel sounds   Extremities: No cyanosis, clubbing or edema Psychiatry:  Mood & affect appropriate.      CBC: Recent Labs  Lab 01/13/24 1509  WBC 10.8*  HGB 10.3*  HCT 35.3*  MCV  91.5  PLT 515*   Basic Metabolic Panel: Recent Labs  Lab 01/13/24 1509  NA 142  K 3.9  CL 105  CO2 28  GLUCOSE 160*  BUN 13  CREATININE 0.84  CALCIUM 9.4     Scheduled Meds:  ammonium lactate   Topical BID   atenolol  50 mg Oral BID   celecoxib  200 mg Oral QHS   dexamethasone  4 mg Oral Daily   enoxaparin (LOVENOX) injection  40 mg Subcutaneous Q24H   feeding supplement  1 Container Oral TID BM   HYDROmorphone   Intravenous Q4H   magnesium oxide  400 mg Oral Daily   mirtazapine  15 mg Oral QHS   multivitamins with iron  1 tablet Oral Daily   oxyCODONE  40 mg Oral Q8H   polyethylene glycol  17 g Oral BID   senna-docusate  1 tablet Oral BID    Imaging and lab data personally reviewed   Author: Calvert Cantor  01/15/2024 1:39 PM  To contact [facility]>   Check the care team in [facility] and look for the attending/consulting TRH provider listed  Log into [email or web address] and use [place]'s universal password   Go to> "[facility]"  and find provider  If you still have difficulty reaching the provider, please page the [facility] (Director on Call) for the Hospitalists listed on amion

## 2024-01-15 NOTE — Procedures (Signed)
 Ultrasound-guided therapeutic right thoracentesis performed yielding 1.7 liters of amber  fluid. No immediate complications. Follow-up chest x-ray pending.  EBL < 2 cc.

## 2024-01-16 DIAGNOSIS — G893 Neoplasm related pain (acute) (chronic): Secondary | ICD-10-CM | POA: Diagnosis not present

## 2024-01-16 NOTE — Progress Notes (Signed)
 [facility] Progress Note  Patient: Misty Arnold     ZOX:[id number]  DOA: 12/30/2023   PCP: Serena Croissant, MD       Brief hospital course: This is a 53 year old female with right sided metastatic breast cancer status post palliative chemo, radiation and mastectomy, chronic pain secondary to cancer, depression and anxiety who was admitted for uncontrolled pain.  Subjective:  Feels better after thoracentesis done yesterday.  Feels that pain is well-controlled today with the Dilaudid PCA.`  Assessment and Plan: Principal Problem:   Cancer associated pain -Currently on Dilaudid PCA  Active Problems:    Metastasis from breast cancer (HCC) - fungating mass across her right chest wall encroaching into her left chest wall - Has extensive bony metastasis, mets to the liver and lungs - cont decadron   Right pleural effusion - Status post 1.4 L removal via thoracentesis on 2/26  Underweigt Body mass index is 19.97 kg/m.     Code Status: Limited: Do not attempt resuscitation (DNR) -DNR-LIMITED -Do Not Intubate/DNI  Total time on patient care: 35 minutes. DVT prophylaxis:  Place TED hose Start: 01/09/24 1043 enoxaparin (LOVENOX) injection 40 mg Start: 01/01/24 1000     Objective:   Vitals:   01/16/24 0752 01/16/24 1110 01/16/24 1119 01/16/24 1457  BP:  113/73  121/75  Pulse:  94  91  Resp: 16  16 16   Temp:    98.3 F (36.8 C)  TempSrc:    Oral  SpO2:   91% 99%  Weight:      Height:       Filed Weights   12/30/23 1220  Weight: 54.4 kg   Exam: General exam: Appears comfortable  HEENT: oral mucosa moist Respiratory system: Clear to auscultation.  Chest wall: Extensive fungating necrotic mass on right chest wall and part of left chest wall Cardiovascular system: S1 & S2 heard  Gastrointestinal system: Abdomen soft, non-tender, nondistended. Normal bowel sounds   Extremities: No cyanosis, clubbing or edema Psychiatry:  Mood & affect appropriate.       CBC: Recent Labs  Lab 01/13/24 1509  WBC 10.8*  HGB 10.3*  HCT 35.3*  MCV 91.5  PLT 515*   Basic Metabolic Panel: Recent Labs  Lab 01/13/24 1509  NA 142  K 3.9  CL 105  CO2 28  GLUCOSE 160*  BUN 13  CREATININE 0.84  CALCIUM 9.4     Scheduled Meds:  ammonium lactate   Topical BID   atenolol  50 mg Oral BID   celecoxib  200 mg Oral QHS   dexamethasone  4 mg Oral Daily   enoxaparin (LOVENOX) injection  40 mg Subcutaneous Q24H   feeding supplement  1 Container Oral TID BM   HYDROmorphone   Intravenous Q4H   magnesium oxide  400 mg Oral Daily   mirtazapine  15 mg Oral QHS   multivitamins with iron  1 tablet Oral Daily   oxyCODONE  40 mg Oral Q8H   polyethylene glycol  17 g Oral BID   senna-docusate  1 tablet Oral BID    Imaging and lab data personally reviewed   Author: Calvert Cantor  01/16/2024 2:59 PM  To contact [facility]>   Check the care team in [facility] and look for the attending/consulting TRH provider listed  Log into [email or web address] and use [place]'s universal password   Go to> "[facility]"  and find provider  If you still have difficulty reaching the provider, please page the [facility] (Director on Call)  for the Hospitalists listed on amion

## 2024-01-17 ENCOUNTER — Other Ambulatory Visit: Payer: Self-pay

## 2024-01-17 ENCOUNTER — Inpatient Hospital Stay (HOSPITAL_COMMUNITY)

## 2024-01-17 DIAGNOSIS — G893 Neoplasm related pain (acute) (chronic): Secondary | ICD-10-CM | POA: Diagnosis not present

## 2024-01-17 LAB — PROTIME-INR
INR: 1.2 (ref 0.8–1.2)
Prothrombin Time: 15.4 s — ABNORMAL HIGH (ref 11.4–15.2)

## 2024-01-17 MED ORDER — SODIUM CHLORIDE 0.9% FLUSH
10.0000 mL | INTRAVENOUS | Status: DC | PRN
  Administered 2024-01-17: 10 mL

## 2024-01-17 MED ORDER — CHLORHEXIDINE GLUCONATE CLOTH 2 % EX PADS
6.0000 | MEDICATED_PAD | Freq: Every day | CUTANEOUS | Status: DC
Start: 2024-01-17 — End: 2024-02-17
  Administered 2024-01-17 – 2024-02-17 (×32): 6 via TOPICAL

## 2024-01-17 MED ORDER — SODIUM CHLORIDE 0.9% FLUSH
10.0000 mL | Freq: Two times a day (BID) | INTRAVENOUS | Status: DC
  Administered 2024-01-18 – 2024-02-14 (×39): 10 mL

## 2024-01-17 NOTE — Plan of Care (Signed)

## 2024-01-17 NOTE — Progress Notes (Signed)
 [facility] Progress Note  Patient: Misty Arnold     ZOX:[id number]  DOA: 12/30/2023   PCP: Serena Croissant, MD       Brief hospital course: This is a 53 year old female with right sided metastatic breast cancer status post palliative chemo, radiation and mastectomy, chronic pain secondary to cancer, depression and anxiety who was admitted for uncontrolled pain.  Subjective:  Pain is overall well-controlled.  Assessment and Plan: Principal Problem:   Cancer associated pain -Currently on Dilaudid PCA-the plan is to transition her to home under hospice care with the PCA  Active Problems:    Metastasis from breast cancer (HCC) - fungating mass across her right chest wall encroaching into her left chest wall - Has extensive bony metastasis, mets to the liver and lungs - cont decadron   Right pleural effusion - Status post 1.4 L removal via thoracentesis on 2/26  Underweigt Body mass index is 19.97 kg/m.     Code Status: Limited: Do not attempt resuscitation (DNR) -DNR-LIMITED -Do Not Intubate/DNI  Total time on patient care: 35 minutes. DVT prophylaxis:  Place TED hose Start: 01/09/24 1043 enoxaparin (LOVENOX) injection 40 mg Start: 01/01/24 1000     Objective:   Vitals:   01/17/24 0602 01/17/24 0723 01/17/24 1010 01/17/24 1015  BP: 116/74  115/77 (P) 115/77  Pulse: 100  (!) 102 (!) (P) 101  Resp: 17 16    Temp: 99.6 F (37.6 C)     TempSrc: Oral     SpO2: 92%  97% (!) (P) 87%  Weight:      Height:       Filed Weights   12/30/23 1220  Weight: 54.4 kg   Exam: General exam: Appears comfortable  HEENT: oral mucosa moist Respiratory system: Clear to auscultation.  Chest wall: Extensive fungating necrotic mass on right chest wall and part of left chest wall Cardiovascular system: S1 & S2 heard  Gastrointestinal system: Abdomen soft, non-tender, nondistended. Normal bowel sounds   Extremities: No cyanosis, clubbing or edema Psychiatry:  Mood & affect  appropriate.      CBC: Recent Labs  Lab 01/13/24 1509  WBC 10.8*  HGB 10.3*  HCT 35.3*  MCV 91.5  PLT 515*   Basic Metabolic Panel: Recent Labs  Lab 01/13/24 1509  NA 142  K 3.9  CL 105  CO2 28  GLUCOSE 160*  BUN 13  CREATININE 0.84  CALCIUM 9.4     Scheduled Meds:  ammonium lactate   Topical BID   atenolol  50 mg Oral BID   celecoxib  200 mg Oral QHS   dexamethasone  4 mg Oral Daily   enoxaparin (LOVENOX) injection  40 mg Subcutaneous Q24H   feeding supplement  1 Container Oral TID BM   HYDROmorphone   Intravenous Q4H   magnesium oxide  400 mg Oral Daily   mirtazapine  15 mg Oral QHS   multivitamins with iron  1 tablet Oral Daily   oxyCODONE  40 mg Oral Q8H   polyethylene glycol  17 g Oral BID   senna-docusate  1 tablet Oral BID    Imaging and lab data personally reviewed   Author: Calvert Cantor  01/17/2024 11:40 AM  To contact [facility]>   Check the care team in [facility] and look for the attending/consulting TRH provider listed  Log into [email or web address] and use [place]'s universal password   Go to> "[facility]"  and find provider  If you still have difficulty reaching the provider, please  page the [facility] (Director on Call) for the Hospitalists listed on amion

## 2024-01-17 NOTE — Progress Notes (Signed)
 Peripherally Inserted Central Catheter Placement  The IV Nurse has discussed with the patient and/or persons authorized to consent for the patient, the purpose of this procedure and the potential benefits and risks involved with this procedure.  The benefits include less needle sticks, lab draws from the catheter, and the patient may be discharged home with the catheter. Risks include, but not limited to, infection, bleeding, blood clot (thrombus formation), and puncture of an artery; nerve damage and irregular heartbeat and possibility to perform a PICC exchange if needed/ordered by physician.  Alternatives to this procedure were also discussed.  Bard Power PICC patient education guide, fact sheet on infection prevention and patient information card has been provided to patient /or left at bedside.    PICC Placement Documentation  PICC Single Lumen 01/17/24 Left Brachial 39 cm 0 cm (Active)  Indication for Insertion or Continuance of Line Prolonged intravenous therapies 01/17/24 1805  Exposed Catheter (cm) 0 cm 01/17/24 1805  Site Assessment Clean, Dry, Intact 01/17/24 1805  Line Status Flushed;Saline locked;Blood return noted 01/17/24 1805  Dressing Type Transparent;Securing device 01/17/24 1805  Dressing Status Antimicrobial disc/dressing in place 01/17/24 1805  Line Care Connections checked and tightened 01/17/24 1805  Line Adjustment (NICU/IV Team Only) No 01/17/24 1805  Dressing Intervention New dressing;Adhesive placed at insertion site (IV team only) 01/17/24 1805  Dressing Change Due 01/24/24 01/17/24 1805       Vernona Rieger  Neven Fina 01/17/2024, 6:06 PM

## 2024-01-17 NOTE — Consult Note (Addendum)
 Chief Complaint: malignant effusion in right pleural space, request  for image guided Pleurx   Referring Provider(s): Dr. Karlyne Greenspan  Supervising Physician: Oley Balm  Patient Status: [facility] - In-pt  History of Present Illness: Misty Arnold is a 53 y.o. female with a past medical history of anemia, HTN, depression, right sided metastatic breast cancer involving chest wall with metastasis to the liver and lungs; status post palliative chemo, radiation, and mastectomy.  Admitted 01/16/2023 for uncontrolled pain.  During admission started on Dilaudid PCA pump and received a therapeutic thoracentesis for right pleural effusion on 3/12 yielded 1.7 L of amber fluid.  IR consulted for image guided Pleurx drain for malignant effusion to right pleural space.   Patient is DNR  Past Medical History:  Diagnosis Date   Abnormal uterine bleeding    Anemia    Breast cancer (HCC)    Depression    Fibroid    Hormone disorder    Hypertension    Infertility, female     Past Surgical History:  Procedure Laterality Date   ABDOMINAL HYSTERECTOMY     BREAST SURGERY     LAMINECTOMY N/A 01/04/2020   Procedure: Thoracic Eight, Thoracic Nine THORACIC LAMINECTOMY FOR TUMOR with Thoracic Seven-Thoracic Ten instrumentation;  Surgeon: Julio Sicks, MD;  Location: [facility] OR;  Service: Neurosurgery;  Laterality: N/A;  Thoracic Eight, Thoracic Nine THORACIC LAMINECTOMY FOR TUMOR with Thoracic Seven-Thoracic Ten instrumentation   MASTECTOMY     salivary gland stone      Allergies: Amoxicillin, Penicillins, Pork-derived products, Tamoxifen, and Tramadol  Medications: Prior to Admission medications   Medication Sig Start Date End Date Taking? Authorizing Provider  atenolol (TENORMIN) 50 MG tablet Take 1 tablet (50 mg total) by mouth 2 (two) times daily. 04/24/22  Yes Serena Croissant, MD  celecoxib (CELEBREX) 200 MG capsule Take 1 capsule (200 mg total) by mouth at bedtime. 10/11/23  Yes Serena Croissant, MD   dexamethasone (DECADRON) 1 MG tablet Take 2 tablets (2 mg total) by mouth daily. 12/12/23  Yes Serena Croissant, MD  diazepam (VALIUM) 5 MG tablet Take 1 tablet (5 mg total) by mouth every 6 (six) hours as needed for muscle spasms. 12/11/23  Yes Serena Croissant, MD  magnesium oxide (MAG-OX) 400 (240 Mg) MG tablet Take 1 tablet (400 mg total) by mouth 2 (two) times daily. 12/10/23  Yes Anderson Malta L, DO  metroNIDAZOLE (METROGEL) 0.75 % gel Apply topically 2 (two) times daily. 11/14/23  Yes Serena Croissant, MD  mirtazapine (REMERON) 15 MG tablet Take 1 tablet (15 mg total) by mouth at bedtime. 12/10/23 01/09/24 Yes Lanae Boast, MD  Multiple Vitamins-Minerals (MULTIVITAMIN WITH MINERALS) tablet FLORADIX Supplement-Iron +minerals Patient taking differently: Take 1 tablet by mouth See admin instructions. FLORADIX Supplement-Iron +minerals- Take 1 tablet by mouth once a day with food 10/16/21  Yes Anderson Malta L, DO  ondansetron (ZOFRAN) 8 MG tablet Take 1 tablet (8 mg total) by mouth every 8 (eight) hours as needed for nausea or vomiting. 11/14/23  Yes Rachel Moulds, MD  oxyCODONE ER [facility]) 13.5 MG C12A Take 1 capsule by mouth every 8 (eight) hours. 12/10/23  Yes Edsel Petrin, DO  Oxycodone HCl 10 MG TABS Take 1 tablet (10 mg total) by mouth every 3 (three) hours as needed ((score 7 to 10)). Patient taking differently: Take 10 mg by mouth every 3 (three) hours as needed (for pain- score 7-10). 11/11/23  Yes Pickenpack-Cousar, Arty Baumgartner, NP  polyethylene glycol (MIRALAX / GLYCOLAX)  17 g packet Take 17 g by mouth daily. Patient taking differently: Take 17 g by mouth daily as needed for mild constipation (mix as directed). 10/16/21  Yes Anderson Malta L, DO  potassium chloride SA (KLOR-CON M) 20 MEQ tablet Take 1 tablet by mouth daily. 12/12/23  Yes Serena Croissant, MD  prochlorperazine (COMPAZINE) 10 MG tablet Take 1 tablet (10 mg total) by mouth every 6 (six) hours as needed for nausea or vomiting.  11/14/23  Yes Rachel Moulds, MD  Vitamin D, Ergocalciferol, (DRISDOL) 1.25 MG (50000 UNIT) CAPS capsule Take 1 capsule (50,000 Units total) by mouth every 7 (seven) days. 12/10/23  Yes Edsel Petrin, DO  Metronidazole Benzoate POWD 1 Dose by Does not apply route daily. Patient not taking: Reported on 12/30/2023 12/10/23   Edsel Petrin, DO  Silver-Carboxymethylcellulose (AQUACEL AG ADVANTAGE) [phone]" PADS Apply 1 Pad topically in the morning and at bedtime. Patient not taking: Reported on 12/30/2023 07/03/23   Serena Croissant, MD     Family History  Problem Relation Age of Onset   Stroke Mother    Heart attack Mother    Diabetes Mother    Hyperlipidemia Mother    Heart disease Mother    Diabetes Father    Stroke Father    Stroke Maternal Grandmother    Cancer Maternal Grandfather    Colon cancer Maternal Grandfather    Stroke Maternal Grandfather    Stroke Paternal Grandmother    Heart attack Paternal Grandmother    Heart failure Paternal Grandmother    Diabetes Paternal Grandmother    Hyperlipidemia Paternal Grandmother    CAD Paternal Grandmother    Cancer Paternal Grandfather    Colon cancer Paternal Grandfather    Cancer Maternal Aunt 27       breast   Stroke Maternal Aunt    Heart attack Maternal Aunt    Breast cancer Maternal Aunt     Social History   Socioeconomic History   Marital status: Divorced    Spouse name: Not on file   Number of children: Not on file   Years of education: 12   Highest education level: Not on file  Occupational History   Occupation: disabled  Tobacco Use   Smoking status: Never   Smokeless tobacco: Never  Vaping Use   Vaping status: Never Used  Substance and Sexual Activity   Alcohol use: Not Currently   Drug use: No   Sexual activity: Yes    Partners: Male    Birth control/protection: Surgical, Condom  Other Topics Concern   Not on file  Social History Narrative   Lives at home with her grandmother   Right handed    Caffeine seldom, coffee once or twice monthly   Social Drivers of Health   Financial Resource Strain: Not on file  Food Insecurity: No Food Insecurity (12/30/2023)   Hunger Vital Sign    Worried About Running Out of Food in the Last Year: Never true    Ran Out of Food in the Last Year: Never true  Recent Concern: Food Insecurity - Food Insecurity Present (12/11/2023)   Hunger Vital Sign    Worried About Running Out of Food in the Last Year: Sometimes true    Ran Out of Food in the Last Year: Never true  Transportation Needs: Unmet Transportation Needs (12/30/2023)   PRAPARE - Administrator, Civil Service (Medical): Yes    Lack of Transportation (Non-Medical): Yes  Physical Activity: Not on file  Stress: Not on file  Social Connections: Moderately Integrated (12/30/2023)   Social Connection and Isolation Panel [NHANES]    Frequency of Communication with Friends and Family: More than three times a week    Frequency of Social Gatherings with Friends and Family: Not on file    Attends Religious Services: More than 4 times per year    Active Member of [employer] or Organizations: Yes    Attends Engineer, structural: More than 4 times per year    Marital Status: Divorced      Review of Systems  Constitutional:  Negative for chills and fever.  Respiratory:  Negative for shortness of breath.   Cardiovascular:  Positive for leg swelling. Negative for chest pain.  Gastrointestinal:  Negative for nausea and vomiting.  Hematological:  Does not bruise/bleed easily.     Vital Signs: BP (P) 115/77 (BP Location: Right Arm)   Pulse (!) (P) 101   Temp 99.6 F (37.6 C) (Oral)   Resp 16   Ht 5\' 5"  (1.651 m)   Wt 120 lb (54.4 kg)   SpO2 (!) (P) 87%   BMI 19.97 kg/m     Physical Exam HENT:     Mouth/Throat:     Mouth: Mucous membranes are dry.     Pharynx: Oropharynx is clear.  Cardiovascular:     Pulses: Normal pulses.     Heart sounds: Normal heart sounds.   Pulmonary:     Comments: R side breath sounds diminished Abdominal:     Palpations: Abdomen is soft.     Tenderness: There is no abdominal tenderness.  Musculoskeletal:     Right lower leg: Edema present.     Left lower leg: Edema present.  Neurological:     Mental Status: She is alert and oriented to person, place, and time.      Imaging: [name] EKG SITE RITE Result Date: 01/17/2024 If Site Rite image not attached, placement could not be confirmed due to current cardiac rhythm.  DG Chest 1 View Result Date: 01/15/2024 CLINICAL DATA:  [id number] S/P thoracentesis [id number] EXAM: PORTABLE CHEST 1 VIEW COMPARISON:  IR ultrasound, earlier same day. Chest XR, 01/13/2024. CT chest, 01/01/2024. FINDINGS: Cardiac silhouette is within normal limits. The LEFT lung is well inflated and relatively clear. Improved aeration of the RIGHT lung post thoracentesis without significant residual pleural effusion. Suspect ex vacuo change at the RIGHT lower lobe with incomplete inflation. No pneumothorax. RIGHT axillary surgical clips. Chronic RIGHT chest wall deformity with multilevel lytic osseous lesions. Postsurgical changes of thoracic spine fixation. No interval osseous abnormality. IMPRESSION: 1. Improved aeration of the RIGHT lung post thoracentesis. No significant residual pleural effusion. 2. Incompletely re-inflation of the RIGHT lower lobe, likely ex vacuo change. No pneumothorax. Electronically Signed   By: Roanna Banning M.D.   On: 01/15/2024 16:41   US THORACENTESIS ASP PLEURAL SPACE W/IMG GUIDE Result Date: 01/15/2024 INDICATION: Patient with history of metastatic breast cancer, recurrent right pleural effusion. Request received for therapeutic right thoracentesis. EXAM: ULTRASOUND GUIDED RIGHT THERAPEUTIC THORACENTESIS MEDICATIONS: 8 ml 1% lidocaine COMPLICATIONS: None immediate. PROCEDURE: An ultrasound guided thoracentesis was thoroughly discussed with the patient/significant other and questions answered.  The benefits, risks, alternatives and complications were also discussed. The patient understands and wishes to proceed with the procedure. Written consent was obtained. Ultrasound was performed to localize and mark an adequate pocket of fluid in the right chest. The area was then prepped and draped in the normal sterile fashion. 1% Lidocaine was  used for local anesthesia. Under ultrasound guidance a 6 Fr Safe-T-Centesis catheter was introduced. Thoracentesis was performed. The catheter was removed and a dressing applied. FINDINGS: A total of approximately 1.7 liters of amber fluid was removed. IMPRESSION: Successful ultrasound guided RIGHT therapeutic thoracentesis yielding 1.7 liters of pleural fluid. Performed by: Artemio Aly Electronically Signed   By: Roanna Banning M.D.   On: 01/15/2024 16:37   DG Chest 1 View Result Date: 01/13/2024 CLINICAL DATA:  Malignant pleural effusion. EXAM: CHEST  1 VIEW COMPARISON:  Chest radiograph and CT 01/01/2024. FINDINGS: Right pleural effusion is at least moderate in size and tracks laterally, increased from prior exam. Progressive opacity projecting over the mid right hemithorax may be related to overlying chest wall mass versus increasing pleural effusion. Stable heart size and mediastinal contours. Destruction of right ribs again seen. No pneumothorax. No focal left lung abnormality. Thoracic fusion hardware. IMPRESSION: 1. Increased right pleural effusion, at least moderate in size. 2. Progressive opacity projecting over the mid right hemithorax may be related to overlying chest wall mass versus increasing pleural effusion. Electronically Signed   By: Narda Rutherford M.D.   On: 01/13/2024 19:42   DG Chest 1 View Result Date: 01/01/2024 CLINICAL DATA:  [id number] S/P thoracentesis [id number] EXAM: PORTABLE CHEST 1 VIEW COMPARISON:  IR ultrasound, earlier same day. FINDINGS: Cardiac silhouette is within normal limits. The LEFT lung is well inflated and clear. Improved  aeration of the RIGHT lung post thoracentesis with no significant residual pleural effusion. Ex vacuo change at the RIGHT lower lobe with incomplete inflation. No pneumothorax. RIGHT axillary surgical clips. Chronic RIGHT chest wall deformity with multilevel lytic osseous lesions. Postsurgical changes of thoracic spine fixation. No interval osseous abnormality. IMPRESSION: 1. Improved aeration of the RIGHT lung post thoracentesis with no significant residual pleural effusion. 2. Incomplete re-inflation of the RIGHT lower lobe, likely ex vacuo. No pneumothorax. Electronically Signed   By: Roanna Banning M.D.   On: 01/01/2024 16:30   US THORACENTESIS ASP PLEURAL SPACE W/IMG GUIDE Result Date: 01/01/2024 INDICATION: 53 year old female with metastatic right breast cancer and right pleural effusion for therapeutic thoracentesis. EXAM: ULTRASOUND GUIDED RIGHT THORACENTESIS MEDICATIONS: 12 mL 1% lidocaine COMPLICATIONS: None immediate. PROCEDURE: An ultrasound guided thoracentesis was thoroughly discussed with the patient and questions answered. The benefits, risks, alternatives and complications were also discussed. The patient understands and wishes to proceed with the procedure. Written consent was obtained. Ultrasound was performed to localize and mark an adequate pocket of fluid in the right chest. The area was then prepped and draped in the normal sterile fashion. 1% Lidocaine was used for local anesthesia. Under ultrasound guidance a 8 Fr Safe-T-Centesis catheter was introduced. Thoracentesis was performed. The catheter was removed and a dressing applied. FINDINGS: A total of approximately 1.4 L of dark yellow fluid was removed. Samples were sent to the laboratory as requested by the clinical team. IMPRESSION: Successful ultrasound guided diagnostic and therapeutic RIGHT thoracentesis yielding 1.4 L of pleural fluid. Performed By Theresa Mulligan, PA-C and supervised by Roanna Banning, MD Electronically Signed   By:  Roanna Banning M.D.   On: 01/01/2024 16:25   CT CHEST W CONTRAST Result Date: 01/01/2024 CLINICAL DATA:  Chest wall pain. No trauma. Known malignancy. History of breast cancer. EXAM: CT CHEST WITH CONTRAST TECHNIQUE: Multidetector CT imaging of the chest was performed during intravenous contrast administration. RADIATION DOSE REDUCTION: This exam was performed according to the departmental dose-optimization program which includes automated exposure control, adjustment of  the mA and/or kV according to patient size and/or use of iterative reconstruction technique. CONTRAST:  75mL OMNIPAQUE IOHEXOL 300 MG/ML  SOLN COMPARISON:  Chest CT dated 11/22/2023. FINDINGS: Cardiovascular: There is no cardiomegaly. There is coronary vascular calcification. The thoracic aorta is unremarkable. 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 The origins of the great vessels of the aortic arch and the central pulmonary arteries appear patent as visualized. Mediastinum/Nodes: No obvious hilar adenopathy. Evaluation is however limited due to pleural effusion and consolidative changes of the lung. Mildly enlarged lymph nodes in the anterior mediastinum measure up to 13 mm in short axis. The esophagus is grossly unremarkable. Lungs/Pleura: Large right pleural effusion with interval increase in the size of the pleural effusion compared to prior CT. There is compressive atelectasis of the majority of the right lung progressed since the prior CT. There is associated mass effect and shift of the mediastinum to the left of the midline. No pneumothorax. The central airways are patent. Upper Abdomen: Hepatic metastatic disease. Musculoskeletal: Large mass involving the right breast extending into the skin and right chest wall. This mass measures approximately 7 x 14 cm in greatest axial dimensions. There is invasion into the intercostal and pleural space. There is destruction of the sternum with invasion of the  mass into the anterior mediastinum. The structures changes of the anterolateral right fourth-seventh ribs. Additional mass in the right posterior chest wall with destructive changes of the posterior eighth and ninth ribs. Similar appearance of a lobulated mass in the left axilla measuring approximately 4 x 6 cm. Overall there is progression of disease since the prior CT. There is extension of mass into the central canal at T8-T9. Lytic lesion involving the T8 and T9 as seen previously. Posterior spinal hardware again noted. IMPRESSION: 1. Interval progression of disease with increase in the size of right chest wall mass and right pleural effusion. Progression of compressive atelectasis of the right lung with mass effect and shift of the mediastinum to the left of the midline. 2. Hepatic metastatic disease. Electronically Signed   By: Elgie Collard M.D.   On: 01/01/2024 12:20    Labs:  CBC: Recent Labs    12/30/23 1257 01/01/24 0606 01/04/24 0531 01/13/24 1509  WBC 11.7* 9.3 14.4* 10.8*  HGB 10.9* 10.1* 10.1* 10.3*  HCT 35.3* 34.2* 34.1* 35.3*  PLT 509* 459* 486* 515*    COAGS: Recent Labs    11/22/23 2116  INR 1.2    BMP: Recent Labs    12/31/23 0557 01/01/24 0606 01/04/24 0531 01/13/24 1509  NA 142 138 140 142  K 4.1 4.2 4.3 3.9  CL 108 106 105 105  CO2 22 23 29 28   GLUCOSE 78 108* 141* 160*  BUN 10 12 17 13   CALCIUM 9.1 8.6* 9.2 9.4  CREATININE 0.87 0.88 0.92 0.84  GFRNONAA >60 >60 >60 >60    LIVER FUNCTION TESTS: Recent Labs    12/12/23 1148 12/30/23 1108 12/31/23 0557 01/13/24 1509  BILITOT 0.2 0.3 0.5 0.4  AST 60* 69* 64* 79*  ALT 40 31 31 41  ALKPHOS 119 158* 129* 281*  PROT 7.0 7.3 7.0 7.0  ALBUMIN 3.3* 3.3* 2.8* 2.8*    TUMOR MARKERS: No results for input(s): "AFPTM", "CEA", "CA199", "CHROMGRNA" in the last 8760 hours.  Assessment and Plan:  malignant effusion and right pleural space -R metastatic BCA with mets to lungs and liver -request  for image guided R pleurx drain placement  after discussion of care with palliative team  -1.7L amber fluid out from thoracentesis on 3/12; previous thora on 2/26  -currently admitted for pain control, palliative team involved -denies SHOB, afebrile, VS stable -case discussed with Dr. Deanne Coffer, approves plan for pleurx if enough fluid present. At this time, small pleural effusion noted on [name] and patient asymptomatic. Procedure postponed to 01/20/24.  Risks and benefits discussed with the patient and significant other Renee Harder, via phone) including bleeding, infection, damage to adjacent structures, malfunction of the catheter with need for additional procedures.  All of the patient's questions were answered, patient is agreeable to proceed. Consent signed and in chart.  Thank you for allowing our service to participate in Marlena Barbato 's care.    Electronically Signed: Carlton Adam, NP   01/17/2024, 12:31 PM     I spent a total of 20 Minutes   in face to face in clinical consultation, greater than 50% of which was counseling/coordinating care for request for image guided pleurx drain for recurrent malignant right pleural effusion.    (A copy of this note was sent to the referring provider and the time of visit.)

## 2024-01-17 NOTE — TOC Progression Note (Signed)
 Transition of Care [facility]) - Progression Note    Patient Details  Name: Misty Arnold MRN: [id number] Date of Birth: Apr 18, 1971  Transition of Care [facility]) CM/SW Contact  Beckie Busing, RN Phone Number:[phone]  01/17/2024, 1:07 PM  Clinical Narrative:   Cm has printed [email or web address] list for Hospice services.Cm attempted to call [facility] [phone] number is changed or no longer in service. CM attempted to call referral to [facility] [phone]. CM spoke with nurse manager Lafonda Mosses who states that agency travel limits are 60 miles outside of [place] and [place] is well beyond the limits. Lafonda Mosses explains that the Hospice agency will need to travel to [place] to deliver PCA and follow up with the patient once she is home and the distance is just too far. CM attempted to call referral to [facility] and Palliative Care. The agency is unable to accept referral due to [facility] being out of their service area. TOC will continue to work on referrals.         Expected Discharge Plan and Services                                               Social Determinants of Health (SDOH) Interventions SDOH Screenings   Food Insecurity: No Food Insecurity (12/30/2023)  Recent Concern: Food Insecurity - Food Insecurity Present (12/11/2023)  Housing: High Risk (12/30/2023)  Transportation Needs: Unmet Transportation Needs (12/30/2023)  Utilities: Not At Risk (12/30/2023)  Depression (PHQ2-9): Medium Risk (02/15/2020)  Social Connections: Moderately Integrated (12/30/2023)  Tobacco Use: Low Risk  (12/30/2023)    Readmission Risk Interventions    01/01/2024    9:51 AM 10/12/2021    3:40 PM  Readmission Risk Prevention Plan  Transportation Screening Complete Complete  PCP or Specialist Appt within 5-7 Days Complete   PCP or Specialist Appt within 3-5 Days  Complete  Home Care Screening Complete   Medication Review (RN CM) Complete   HRI or Home  Care Consult  Complete  Social Work Consult for Recovery Care Planning/Counseling  Complete  Medication Review Oceanographer)  Complete

## 2024-01-18 DIAGNOSIS — G893 Neoplasm related pain (acute) (chronic): Secondary | ICD-10-CM | POA: Diagnosis not present

## 2024-01-18 NOTE — Progress Notes (Signed)
 Palliative Care  Progress Note  Ineze continues to require inpatient level care with PCA , complex malignant wound management and management of recurrent pleural effusion. Plan is for her to discharge to her grandmothers house in [place], [place] when medically stable and once we secure a hospice care provider in that area.  Will need to place PICC for CADD PCA Will benefit from placement of a Pleurx for malignant effusion management w/hospice Complex wound care needs-hospice can help manage,  Will continue to support and follow for needs.  Anderson Malta, DO Palliative Medicine

## 2024-01-18 NOTE — Plan of Care (Signed)
  Problem: Pain Managment: Goal: General experience of comfort will improve and/or be controlled Outcome: Progressing

## 2024-01-18 NOTE — Plan of Care (Signed)

## 2024-01-18 NOTE — Plan of Care (Signed)

## 2024-01-18 NOTE — Progress Notes (Signed)
 Patient ID: Misty Arnold, female   DOB: [birth year], 53 y.o.   MRN: [id number] Pt had small right effusion of f/u chest [name] yesterday; she is on our schedule for possible right pleurx catheter placement on 3/17 if adequate pleural fluid is noted on next [name]. See consult note from 3/14 for additional details; pt updated with plans; consent signed and in chart.

## 2024-01-18 NOTE — Progress Notes (Signed)
 [facility] Progress Note  Patient: Misty Arnold     VOZ:[id number]  DOA: 12/30/2023   PCP: Serena Croissant, MD       Brief hospital course: This is a 53 year old female with right sided metastatic breast cancer status post palliative chemo, radiation and mastectomy, chronic pain secondary to cancer, depression and anxiety who was admitted for uncontrolled pain.  Subjective:  She has no complaints today.  We have discussed placement of a Pleurx catheter.  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     Code Status: Limited: Do not attempt resuscitation (DNR) -DNR-LIMITED -Do Not Intubate/DNI  Total time on patient care: 35 minutes. DVT prophylaxis:  Place TED hose Start: 01/09/24 1043 enoxaparin (LOVENOX) injection 40 mg Start: 01/01/24 1000     Objective:   Vitals:   01/18/24 0614 01/18/24 0800 01/18/24 0802 01/18/24 1204  BP: 96/65  96/65   Pulse: 83  83   Resp: 14 18  16   Temp: 98.3 F (36.8 C)     TempSrc: Oral     SpO2: 96% 95%    Weight:      Height:       Filed Weights   12/30/23 1220  Weight: 54.4 kg   Exam: General exam: Appears comfortable  HEENT: oral mucosa moist Respiratory system: Clear to auscultation.  Chest wall: Extensive fungating necrotic mass on right chest wall and part of left chest wall Cardiovascular system: S1  & S2 heard  Gastrointestinal system: Abdomen soft, non-tender, nondistended. Normal bowel sounds   Extremities: No cyanosis, clubbing or edema Psychiatry:  Mood & affect appropriate.      CBC: Recent Labs  Lab 01/13/24 1509  WBC 10.8*  HGB 10.3*  HCT 35.3*  MCV 91.5  PLT 515*   Basic Metabolic Panel: Recent Labs  Lab 01/13/24 1509  NA 142  K 3.9  CL 105  CO2 28  GLUCOSE 160*  BUN 13  CREATININE 0.84  CALCIUM 9.4     Scheduled Meds:  ammonium lactate   Topical BID   atenolol  50 mg Oral BID   celecoxib  200 mg Oral QHS   Chlorhexidine Gluconate Cloth  6 each Topical Daily   dexamethasone  4 mg Oral Daily   enoxaparin (LOVENOX) injection  40 mg Subcutaneous Q24H   feeding supplement  1 Container Oral TID BM   HYDROmorphone   Intravenous Q4H   magnesium oxide  400 mg Oral Daily   mirtazapine  15 mg Oral QHS   multivitamins with iron  1 tablet Oral Daily   oxyCODONE  40 mg Oral Q8H   polyethylene glycol  17 g Oral BID   senna-docusate  1 tablet Oral BID   sodium chloride flush  10-40 mL Intracatheter Q12H    Imaging and lab data personally reviewed   Author: Calvert Cantor  01/18/2024 12:07 PM  To contact [facility]>   Check the care team in [facility] and look for the attending/consulting [facility] provider listed  Log into [email or web address] and use [place]'s universal password   Go to> "[facility]"  and find provider  If you still have difficulty reaching the provider, please page the [facility] (Director on Call) for the Hospitalists listed on amion

## 2024-01-19 DIAGNOSIS — G893 Neoplasm related pain (acute) (chronic): Secondary | ICD-10-CM | POA: Diagnosis not present

## 2024-01-19 MED ORDER — VANCOMYCIN HCL IN DEXTROSE 1-5 GM/200ML-% IV SOLN
1000.0000 mg | INTRAVENOUS | Status: AC
  Administered 2024-01-22: 1000 mg via INTRAVENOUS

## 2024-01-19 MED ORDER — ENOXAPARIN SODIUM 40 MG/0.4ML IJ SOSY
40.0000 mg | PREFILLED_SYRINGE | INTRAMUSCULAR | Status: DC
Start: 2024-01-21 — End: 2024-02-17
  Administered 2024-01-21 – 2024-02-12 (×21): 40 mg via SUBCUTANEOUS
  Filled 2024-01-19 (×26): qty 0.4

## 2024-01-19 NOTE — Plan of Care (Signed)
  Problem: Education: Goal: Knowledge of General Education information will improve Description: Including pain rating scale, medication(s)/side effects and non-pharmacologic comfort measures Outcome: Progressing   Problem: Clinical Measurements: Goal: Ability to maintain clinical measurements within normal limits will improve Outcome: Progressing Goal: Will remain free from infection Outcome: Progressing Goal: Respiratory complications will improve Outcome: Progressing   Problem: Activity: Goal: Risk for activity intolerance will decrease Outcome: Progressing   Problem: Nutrition: Goal: Adequate nutrition will be maintained Outcome: Progressing   Problem: Pain Managment: Goal: General experience of comfort will improve and/or be controlled Outcome: Progressing   Problem: Safety: Goal: Ability to remain free from injury will improve Outcome: Progressing   Problem: Skin Integrity: Goal: Risk for impaired skin integrity will decrease Outcome: Progressing

## 2024-01-19 NOTE — Progress Notes (Signed)
 [facility] Progress Note  Patient: Misty Arnold     NFA:[id number]  DOA: 12/30/2023   PCP: Serena Croissant, MD       Brief hospital course: This is a 53 year old female with right sided metastatic breast cancer status post palliative chemo, radiation and mastectomy, chronic pain secondary to cancer, depression and anxiety who was admitted for uncontrolled pain.  Subjective:  No new complaints.   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     Code Status: Limited: Do not attempt resuscitation (DNR) -DNR-LIMITED -Do Not Intubate/DNI  Total time on patient care: 35 minutes. DVT prophylaxis:  enoxaparin (LOVENOX) injection 40 mg Start: 01/21/24 1000 Place TED hose Start: 01/09/24 1043     Objective:   Vitals:   01/18/24 2233 01/18/24 2335 01/19/24 0455 01/19/24 0606  BP: 108/76   94/62  Pulse: (!) 102   83  Resp: 16 14 14 14   Temp: 98.4 F (36.9 C)   98.3 F (36.8 C)  TempSrc: Oral   Oral  SpO2: 97% 98% 97% 100%  Weight:      Height:       Filed Weights   12/30/23 1220  Weight: 54.4 kg   Exam: General exam: Appears comfortable  HEENT: oral mucosa moist Respiratory system: Clear to auscultation.  Chest wall: Extensive fungating necrotic mass on right chest wall and part of left chest wall Cardiovascular system: S1 & S2 heard   Gastrointestinal system: Abdomen soft, non-tender, nondistended. Normal bowel sounds   Extremities: No cyanosis, clubbing or edema Psychiatry:  Mood & affect appropriate.      CBC: Recent Labs  Lab 01/13/24 1509  WBC 10.8*  HGB 10.3*  HCT 35.3*  MCV 91.5  PLT 515*   Basic Metabolic Panel: Recent Labs  Lab 01/13/24 1509  NA 142  K 3.9  CL 105  CO2 28  GLUCOSE 160*  BUN 13  CREATININE 0.84  CALCIUM 9.4     Scheduled Meds:  ammonium lactate   Topical BID   atenolol  50 mg Oral BID   celecoxib  200 mg Oral QHS   Chlorhexidine Gluconate Cloth  6 each Topical Daily   dexamethasone  4 mg Oral Daily   [START ON 01/21/2024] enoxaparin (LOVENOX) injection  40 mg Subcutaneous Q24H   feeding supplement  1 Container Oral TID BM   HYDROmorphone   Intravenous Q4H   magnesium oxide  400 mg Oral Daily   mirtazapine  15 mg Oral QHS   multivitamins with iron  1 tablet Oral Daily   oxyCODONE  40 mg Oral Q8H   polyethylene glycol  17 g Oral BID   senna-docusate  1 tablet Oral BID   sodium chloride flush  10-40 mL Intracatheter Q12H    Imaging and lab data personally reviewed   Author: Calvert Cantor  01/19/2024 9:56 AM  To contact [facility]>   Check the care team in [facility] and look for the attending/consulting [facility] provider listed  Log into [email or web address] and use [place]'s universal password   Go to> "[facility]"  and find provider  If you still have difficulty reaching the provider, please page the [facility] (Director on Call) for the Hospitalists listed on amion

## 2024-01-19 NOTE — Plan of Care (Signed)

## 2024-01-20 ENCOUNTER — Other Ambulatory Visit: Payer: Medicare PPO

## 2024-01-20 ENCOUNTER — Inpatient Hospital Stay: Payer: Medicare PPO | Admitting: Hematology and Oncology

## 2024-01-20 ENCOUNTER — Inpatient Hospital Stay: Payer: Medicare PPO

## 2024-01-20 DIAGNOSIS — G893 Neoplasm related pain (acute) (chronic): Secondary | ICD-10-CM | POA: Diagnosis not present

## 2024-01-20 LAB — CBC WITH DIFFERENTIAL/PLATELET
Abs Immature Granulocytes: 0.06 10*3/uL (ref 0.00–0.07)
Basophils Absolute: 0 10*3/uL (ref 0.0–0.1)
Basophils Relative: 0 %
Eosinophils Absolute: 0 10*3/uL (ref 0.0–0.5)
Eosinophils Relative: 0 %
HCT: 31.6 % — ABNORMAL LOW (ref 36.0–46.0)
Hemoglobin: 9.2 g/dL — ABNORMAL LOW (ref 12.0–15.0)
Immature Granulocytes: 1 %
Lymphocytes Relative: 17 %
Lymphs Abs: 1.5 10*3/uL (ref 0.7–4.0)
MCH: 27.1 pg (ref 26.0–34.0)
MCHC: 29.1 g/dL — ABNORMAL LOW (ref 30.0–36.0)
MCV: 92.9 fL (ref 80.0–100.0)
Monocytes Absolute: 0.8 10*3/uL (ref 0.1–1.0)
Monocytes Relative: 9 %
Neutro Abs: 6.8 10*3/uL (ref 1.7–7.7)
Neutrophils Relative %: 73 %
Platelets: 365 10*3/uL (ref 150–400)
RBC: 3.4 MIL/uL — ABNORMAL LOW (ref 3.87–5.11)
RDW: 20 % — ABNORMAL HIGH (ref 11.5–15.5)
WBC: 9.2 10*3/uL (ref 4.0–10.5)
nRBC: 0 % (ref 0.0–0.2)

## 2024-01-20 NOTE — Progress Notes (Signed)
 HEMATOLOGY-ONCOLOGY PROGRESS NOTE  SUBJECTIVE: Patient with metastatic breast cancer with extensive metastases admitted to the hospital with intractable pain and failure to thrive.  She was noted to have profound ascites and is awaiting paracentesis today.  She intends to go into hospice care upon discharge from the hospital.  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; patient went to cancer treatment centers of [place] for second opinion and delayed therapy   01/26/2011 Surgery   right mastectomy followed by reconstruction: Invasive ductal carcinoma T1 N1 MIC M0 stage IB ER/PR positive HER-2 negative, BRCA negative, Oncotype DX low risk   05/29/2012 Procedure   right chest wall nodule excision done on 06/24/2012 showed metastatic carcinoma margins were positive, but CT scan no metastatic disease, recommended chemotherapy but patient refused also refused reexcision   04/28/2013 Treatment Plan Change   patient went to [place] for alternative treatments and took herbal medications, tonics etc. But she could not afford these trips.   01/08/2014 Breast MRI   right breast multiple enhancing masses within the soft tissues largest 5.6 cm in wall skin surface and the capsule of the silicone prosthesis, contiguous nodules involving in inferomedial breast and tired and subcentimeter nodules across the midline   11/02/2014 Cancer Staging   Staging form: Breast, AJCC 7th Edition - Clinical: Stage IV (T4b, N0, M1) - Signed by Serena Croissant, MD on 12/18/2022   08/29/2015 Surgery   Right mastectomy: IDC with invol of skin and skin ulceration, breast capsule inv cancer, superior medial margin positive, 1/1 LN positive, grade  3, 14.3 cm, 3.5 cm, ALI, chest wall involv, ER 90-100%, PR 80-90%, HER-2 negative, Ki 67 40-50% T4CN1 (St 3B)   03/05/2016 -  Anti-estrogen oral therapy   Tamoxifen 20 mg daily stopped due to headache and uncontrolled hypertension. Decrease to 10 mg daily 04/03/2016, stopped June 2017 and took an estrogen metabolizer over-the-counter; started Aromasin April 2018 from a [place] physician, Zoladex started on 05/2017 and switched to Letrozole in 09/2017   01/04/2020 Surgery   Patient presented to the ED on 01/04/20 for worsening lower extremity numbness and underwent a decompressive laminectomy with tumor resection at T9 that was complicated with acute blood loss anemia and leukocytosis.   01/28/2020 - 02/15/2020 Radiation Therapy   Palliative radiation at site of thoracic tumor resection   03/03/2020 Miscellaneous    Ibrance with letrozole and Zoladex    Miscellaneous   Guardant 360: ESR 1 mutations, PI K3 CA mutation, EGFR mutation, T p53 mutation   10/17/2021 - 06/06/2022 Chemotherapy   Patient is on Treatment Plan : BREAST METASTATIC fam-trastuzumab deruxtecan-nxki (Enhertu) q21d     10/17/2021 - 03/12/2023 Chemotherapy   Patient is on Treatment Plan : BREAST METASTATIC Fam-Trastuzumab Deruxtecan-nxki (Enhertu) (5.4) q21d     11/14/2023 -  Chemotherapy   Patient is on Treatment Plan : BREAST Pembrolizumab (200) q21d x 24 months     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)  01/04/2020 Initial Diagnosis   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)   10/17/2021 - 06/06/2022 Chemotherapy   Patient is on Treatment Plan : BREAST METASTATIC fam-trastuzumab deruxtecan-nxki (Enhertu) q21d     10/17/2021 - 03/12/2023 Chemotherapy   Patient is on Treatment Plan :  BREAST METASTATIC Fam-Trastuzumab Deruxtecan-nxki (Enhertu) (5.4) q21d     11/14/2023 -  Chemotherapy   Patient is on Treatment Plan : BREAST Pembrolizumab (200) q21d x 24 months     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)   01/08/2020 Initial Diagnosis   Metastatic breast cancer (HCC)   10/17/2021 - 06/06/2022 Chemotherapy   Patient is on Treatment Plan : BREAST METASTATIC fam-trastuzumab deruxtecan-nxki (Enhertu) q21d     10/17/2021 - 03/12/2023 Chemotherapy   Patient is on Treatment Plan : BREAST METASTATIC Fam-Trastuzumab Deruxtecan-nxki (Enhertu) (5.4) q21d     11/14/2023 -  Chemotherapy   Patient is on Treatment Plan : BREAST Pembrolizumab (200) q21d x 24 months       OBJECTIVE: REVIEW OF SYSTEMS:   Constitutional: Generalized weakness, weight loss, abdominal distention, shortness of breath All other systems were reviewed with the patient and are negative.     PHYSICAL EXAMINATION: ECOG PERFORMANCE STATUS: 3 - Symptomatic, >50% confined to bed  Vitals:   01/20/24 1353 01/20/24 1534  BP: 107/72   Pulse: 83   Resp: 18 18  Temp: 97.7 F (36.5 C)   SpO2: 93%    Filed Weights   12/30/23 1220  Weight: 120 lb (54.4 kg)      LABORATORY DATA:  I have reviewed the data as listed    Latest Ref Rng & Units 01/13/2024    3:09 PM 01/04/2024    5:31 AM 01/01/2024    6:06 AM  CMP  Glucose 70 - 99 mg/dL 409  811  914   BUN 6 - 20 mg/dL 13  17  12    Creatinine 0.44 - 1.00 mg/dL 7.82  9.56  2.13   Sodium 135 - 145 mmol/L 142  140  138   Potassium 3.5 - 5.1 mmol/L 3.9  4.3  4.2   Chloride 98 - 111 mmol/L 105  105  106   CO2 22 - 32 mmol/L 28  29  23    Calcium 8.9 - 10.3 mg/dL 9.4  9.2  8.6   Total Protein 6.5 - 8.1 g/dL 7.0     Total Bilirubin 0.0 - 1.2 mg/dL 0.4     Alkaline Phos 38 - 126 U/L 281     AST 15 - 41 U/L 79     ALT 0 - 44 U/L 41       Lab Results  Component Value Date   WBC 9.2 01/20/2024   HGB 9.2 (L) 01/20/2024   HCT 31.6 (L) 01/20/2024   MCV 92.9 01/20/2024   PLT 365 01/20/2024   NEUTROABS 6.8 01/20/2024    ASSESSMENT AND PLAN: 1.Metastatic breast cancer: Agree with pain control and hospice care. 2. Intractable Pain No further oncology work up/ treatments planned

## 2024-01-20 NOTE — Progress Notes (Signed)
 [facility] Progress Note  Patient: Misty Arnold     VWU:[id number]  DOA: 12/30/2023   PCP: Serena Croissant, MD       Brief hospital course: This is a 53 year old female with right sided metastatic breast cancer status post palliative chemo, radiation and mastectomy, chronic pain secondary to cancer, depression and anxiety who was admitted for uncontrolled pain.  Subjective:  Back pain is worse today. No other issues.   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     Code Status: Limited: Do not attempt resuscitation (DNR) -DNR-LIMITED -Do Not Intubate/DNI  Total time on patient care: 35 minutes. DVT prophylaxis:  enoxaparin (LOVENOX) injection 40 mg Start: 01/21/24 1000 Place TED hose Start: 01/09/24 1043     Objective:   Vitals:   01/20/24 0004 01/20/24 0505 01/20/24 0615 01/20/24 0819  BP:   112/71   Pulse:   91   Resp: 16 16 16 16   Temp:   98.3 F (36.8 C)   TempSrc:   Oral   SpO2: 93% 95% 100% 100%  Weight:      Height:       Filed Weights   12/30/23 1220  Weight: 54.4 kg   Exam: General exam: Appears comfortable  HEENT: oral mucosa moist Respiratory system: Clear to auscultation. - decreased breath sounds in RLL Chest wall: Extensive fungating necrotic mass on right chest wall and part of left chest wall Cardiovascular system: S1 & S2 heard  Gastrointestinal  system: Abdomen soft, non-tender, nondistended. Normal bowel sounds   Extremities: No cyanosis, clubbing or edema Psychiatry:  Mood & affect appropriate.      CBC: Recent Labs  Lab 01/13/24 1509 01/20/24 0524  WBC 10.8* 9.2  NEUTROABS  --  6.8  HGB 10.3* 9.2*  HCT 35.3* 31.6*  MCV 91.5 92.9  PLT 515* 365   Basic Metabolic Panel: Recent Labs  Lab 01/13/24 1509  NA 142  K 3.9  CL 105  CO2 28  GLUCOSE 160*  BUN 13  CREATININE 0.84  CALCIUM 9.4     Scheduled Meds:  ammonium lactate   Topical BID   atenolol  50 mg Oral BID   celecoxib  200 mg Oral QHS   Chlorhexidine Gluconate Cloth  6 each Topical Daily   dexamethasone  4 mg Oral Daily   [START ON 01/21/2024] enoxaparin (LOVENOX) injection  40 mg Subcutaneous Q24H   feeding supplement  1 Container Oral TID BM   HYDROmorphone   Intravenous Q4H   magnesium oxide  400 mg Oral Daily   mirtazapine  15 mg Oral QHS   multivitamins with iron  1 tablet Oral Daily   oxyCODONE  40 mg Oral Q8H   polyethylene glycol  17 g Oral BID   senna-docusate  1 tablet Oral BID   sodium chloride flush  10-40 mL Intracatheter Q12H    Imaging and lab data  personally reviewed   Author: Calvert Cantor  01/20/2024 9:27 AM  To contact [facility]>   Check the care team in [facility] and look for the attending/consulting [facility] provider listed  Log into [email or web address] and use [place]'s universal password   Go to> "[facility]"  and find provider  If you still have difficulty reaching the provider, please page the [facility] (Director on Call) for the Hospitalists listed on amion

## 2024-01-21 DIAGNOSIS — G893 Neoplasm related pain (acute) (chronic): Secondary | ICD-10-CM | POA: Diagnosis not present

## 2024-01-21 NOTE — Plan of Care (Signed)
  Problem: Education: Goal: Knowledge of General Education information will improve Description: Including pain rating scale, medication(s)/side effects and non-pharmacologic comfort measures Outcome: Progressing   Problem: Clinical Measurements: Goal: Will remain free from infection Outcome: Progressing Goal: Cardiovascular complication will be avoided Outcome: Progressing   Problem: Activity: Goal: Risk for activity intolerance will decrease Outcome: Progressing   Problem: Coping: Goal: Level of anxiety will decrease Outcome: Progressing   Problem: Elimination: Goal: Will not experience complications related to bowel motility Outcome: Progressing Goal: Will not experience complications related to urinary retention Outcome: Progressing   Problem: Pain Managment: Goal: General experience of comfort will improve and/or be controlled Outcome: Progressing   Problem: Safety: Goal: Ability to remain free from injury will improve Outcome: Progressing   Problem: Skin Integrity: Goal: Risk for impaired skin integrity will decrease Outcome: Progressing

## 2024-01-21 NOTE — Progress Notes (Signed)
 [facility] Progress Note  Patient: Misty Arnold     [id number]:[id number]  DOA: 12/30/2023   PCP: Serena Croissant, MD       Brief hospital course: This is a 53 year old female with right sided metastatic breast cancer status post palliative chemo, radiation and mastectomy, chronic pain secondary to cancer, depression and anxiety who was admitted for uncontrolled pain.  Subjective:  No new complaints.  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     Code Status: Limited: Do not attempt resuscitation (DNR) -DNR-LIMITED -Do Not Intubate/DNI  Total time on patient care: 35 minutes. DVT prophylaxis:  enoxaparin (LOVENOX) injection 40 mg Start: 01/21/24 1000 Place TED hose Start: 01/09/24 1043     Objective:   Vitals:   01/21/24 0409 01/21/24 0435 01/21/24 0850 01/21/24 1052  BP:  129/86 128/86   Pulse:  87    Resp: 18 18 18 18   Temp:      TempSrc:      SpO2: 100% 100%    Weight:      Height:       Filed Weights   12/30/23 1220  Weight: 54.4 kg   Exam: General exam: Appears comfortable  HEENT: oral mucosa moist Respiratory system: Clear to auscultation. - decreased breath sounds in RLL Chest wall: Extensive fungating necrotic mass on right chest wall and part of left chest wall Cardiovascular system: S1 & S2 heard   Gastrointestinal system: Abdomen soft, non-tender, nondistended. Normal bowel sounds   Extremities: No cyanosis, clubbing or edema Psychiatry:  Mood & affect appropriate.      CBC: Recent Labs  Lab 01/20/24 0524  WBC 9.2  NEUTROABS 6.8  HGB 9.2*  HCT 31.6*  MCV 92.9  PLT 365   Basic Metabolic Panel: No results for input(s): "NA", "K", "CL", "CO2", "GLUCOSE", "BUN", "CREATININE", "CALCIUM", "MG", "PHOS" in the last 168 hours.    Scheduled Meds:  ammonium lactate   Topical BID   atenolol  50 mg Oral BID   celecoxib  200 mg Oral QHS   Chlorhexidine Gluconate Cloth  6 each Topical Daily   dexamethasone  4 mg Oral Daily   enoxaparin (LOVENOX) injection  40 mg Subcutaneous Q24H   feeding supplement  1 Container Oral TID BM   HYDROmorphone   Intravenous Q4H   magnesium oxide  400 mg Oral Daily   mirtazapine  15 mg Oral QHS   multivitamins with iron  1 tablet Oral Daily   oxyCODONE  40 mg Oral Q8H   polyethylene glycol  17 g Oral BID   senna-docusate  1 tablet Oral BID   sodium chloride flush  10-40 mL Intracatheter Q12H    Imaging and lab data personally reviewed   Author: Calvert Cantor  01/21/2024 11:17 AM  To contact [facility]>   Check the care team in  [facility] and look for the attending/consulting [facility] provider listed  Log into [email or web address] and use [place]'s universal password   Go to> "[facility]"  and find provider  If you still have difficulty reaching the provider, please page the [facility] (Director on Call) for the Hospitalists listed on amion

## 2024-01-21 NOTE — Plan of Care (Signed)

## 2024-01-22 ENCOUNTER — Inpatient Hospital Stay (HOSPITAL_COMMUNITY)

## 2024-01-22 DIAGNOSIS — G893 Neoplasm related pain (acute) (chronic): Secondary | ICD-10-CM | POA: Diagnosis not present

## 2024-01-22 HISTORY — PX: IR PERC PLEURAL DRAIN W/INDWELL CATH W/IMG GUIDE: IMG5383

## 2024-01-22 MED ORDER — LIDOCAINE-EPINEPHRINE 1 %-1:100000 IJ SOLN
INTRAMUSCULAR | Status: AC
  Filled 2024-01-22: qty 1

## 2024-01-22 MED ORDER — FENTANYL CITRATE (PF) 100 MCG/2ML IJ SOLN
INTRAMUSCULAR | Status: AC
Start: 2024-01-22 — End: ?
  Filled 2024-01-22: qty 4

## 2024-01-22 MED ORDER — LIDOCAINE-EPINEPHRINE 1 %-1:100000 IJ SOLN: 20.0000 mL | Freq: Once | INTRAMUSCULAR | Status: DC

## 2024-01-22 MED ORDER — DIPHENHYDRAMINE HCL 50 MG/ML IJ SOLN
INTRAMUSCULAR | Status: AC
  Filled 2024-01-22: qty 1

## 2024-01-22 MED ORDER — MIDAZOLAM HCL 2 MG/2ML IJ SOLN
INTRAMUSCULAR | Status: DC | PRN
  Administered 2024-01-22 (×3): 1 mg via INTRAVENOUS

## 2024-01-22 MED ORDER — METRONIDAZOLE 0.75 % EX GEL
Freq: Four times a day (QID) | CUTANEOUS | Status: DC | PRN
  Filled 2024-01-22 (×4): qty 90

## 2024-01-22 MED ORDER — MIDAZOLAM HCL 2 MG/2ML IJ SOLN
INTRAMUSCULAR | Status: AC
  Filled 2024-01-22: qty 4

## 2024-01-22 MED ORDER — VANCOMYCIN HCL IN DEXTROSE 1-5 GM/200ML-% IV SOLN
INTRAVENOUS | Status: AC
  Filled 2024-01-22: qty 200

## 2024-01-22 MED ORDER — FENTANYL CITRATE (PF) 100 MCG/2ML IJ SOLN
INTRAMUSCULAR | Status: DC | PRN
  Administered 2024-01-22 (×3): 50 ug via INTRAVENOUS

## 2024-01-22 NOTE — Plan of Care (Signed)

## 2024-01-22 NOTE — Progress Notes (Addendum)
 Palliative Care Progress Note  Patient seen and evaluated today, her significant other was at bedside during my visit. With her permission I discussed her goals of care, prognosis and plan for discharge. Her pain has in general trended up and also her anxiety. She is worried about the impact her illness will have on those she cares about.   Summary of Recommendations:  DNR, No CPR or Ventilator at time of death, otherwise medical treatment desired for reversible illness. Discharge planning has been challenging-she is going to [place] to stay with her grandmother and Hospice will support. Hospice resources there are limited. I had planned on sending her out with [facility], however with the hospice agency challenges, I will likely need to discuss transitioning her off PCA. We have options. She is still waiting on Pleurx cath placement- will be tomorrow at 1130, NPO after midnight.  Anderson Malta, DO Palliative Medicine  Time:55 minutes

## 2024-01-22 NOTE — Sedation Documentation (Signed)
 1.2L removed from pluerx drain.

## 2024-01-22 NOTE — Procedures (Signed)
 Vascular and Interventional Radiology Procedure Note  Patient: Misty Arnold DOB: 1971/05/11 Medical Record Number: [id number] Note Date/Time: 01/22/24 1:19 PM   Performing Physician: Roanna Banning, MD Assistant(s): None  Diagnosis: Hospice / Palliative. Pleural effusion  Procedure:  TUNNELED PLEURAL (PleurRx ) DRAINAGE CATHETER  PLACEMENT THERAPEUTIC THORACENTESIS  Anesthesia: Conscious Sedation Complications: None Estimated Blood Loss: Minimal Specimens:  Sent None  Findings:  Tunneled pleural drainage catheter placement, with catheter tip positioned within the RIGHTapical chest . Therapeutic thoracentesis with 1200 mL of fluid was obtained.   Plan: Intermittent drainage schedule per Hospice and Palliative Teams.  See detailed procedure note with images in PACS. The patient tolerated the procedure well without incident or complication and was returned to Recovery in stable condition.    Roanna Banning, MD [facility] Rad[facility]   Pager. [phone] Clinic. [phone]

## 2024-01-22 NOTE — Progress Notes (Signed)
 [facility] Progress Note  Patient: Misty Arnold     WUJ:[id number]  DOA: 12/30/2023   PCP: Serena Croissant, MD       Brief hospital course: This is a 53 year old female with right sided metastatic breast cancer status post palliative chemo, radiation and mastectomy, chronic pain secondary to cancer, depression and anxiety who was admitted for uncontrolled pain. 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     Code Status: Limited: Do not attempt resuscitation (DNR) -DNR-LIMITED -Do Not Intubate/DNI  Total time on patient care: 35 minutes. DVT prophylaxis:  enoxaparin (LOVENOX) injection 40 mg Start: 01/21/24 1000 Place TED hose Start: 01/09/24 1043     Objective:   Vitals:   01/22/24 0359 01/22/24 0439 01/22/24 0850 01/22/24 1208  BP:  108/82    Pulse:  81    Resp: 16 18 18 18   Temp:  97.7 F (36.5 C)    TempSrc:  Oral    SpO2:  93%    Weight:      Height:       Filed Weights   12/30/23 1220  Weight: 54.4 kg   Exam:  General: Appearance:    Thin female in no acute distress     Lungs:     respirations unlabored  Heart:    Normal heart rate.   MS:   All extremities are intact.   Neurologic:   Awake, alert        CBC: Recent Labs  Lab 01/20/24 0524  WBC  9.2  NEUTROABS 6.8  HGB 9.2*  HCT 31.6*  MCV 92.9  PLT 365   Basic Metabolic Panel: No results for input(s): "NA", "K", "CL", "CO2", "GLUCOSE", "BUN", "CREATININE", "CALCIUM", "MG", "PHOS" in the last 168 hours.    Scheduled Meds:  ammonium lactate   Topical BID   atenolol  50 mg Oral BID   celecoxib  200 mg Oral QHS   Chlorhexidine Gluconate Cloth  6 each Topical Daily   dexamethasone  4 mg Oral Daily   enoxaparin (LOVENOX) injection  40 mg Subcutaneous Q24H   feeding supplement  1 Container Oral TID BM   HYDROmorphone   Intravenous Q4H   magnesium oxide  400 mg Oral Daily   mirtazapine  15 mg Oral QHS   multivitamins with iron  1 tablet Oral Daily   oxyCODONE  40 mg Oral Q8H   polyethylene glycol  17 g Oral BID   senna-docusate  1 tablet Oral BID   sodium chloride flush  10-40 mL Intracatheter Q12H    Imaging and lab data personally reviewed   Author: Joseph Art  01/22/2024 12:14 PM  To contact [facility]>   Check the care team in [facility] and look for the attending/consulting [facility] provider listed  Log into [email or web address] and use [place]'s universal password   Go to> "[facility]"  and find provider  If you still have difficulty reaching the provider, please page the [facility] (Director on Call) for the Hospitalists listed on amion

## 2024-01-22 NOTE — Progress Notes (Signed)
 Will pass on to oncoming nurse to obtain pleurex canisters if they haven't arrived by end of shift

## 2024-01-22 NOTE — Plan of Care (Signed)
  Problem: Education: Goal: Knowledge of General Education information will improve Description: Including pain rating scale, medication(s)/side effects and non-pharmacologic comfort measures Outcome: Progressing   Problem: Health Behavior/Discharge Planning: Goal: Ability to manage health-related needs will improve Outcome: Progressing   Problem: Coping: Goal: Level of anxiety will decrease Outcome: Progressing   Problem: Elimination: Goal: Will not experience complications related to bowel motility Outcome: Progressing   Problem: Pain Managment: Goal: General experience of comfort will improve and/or be controlled Outcome: Progressing   Problem: Safety: Goal: Ability to remain free from injury will improve Outcome: Progressing

## 2024-01-23 DIAGNOSIS — G893 Neoplasm related pain (acute) (chronic): Secondary | ICD-10-CM | POA: Diagnosis not present

## 2024-01-23 MED ORDER — DIPHENHYDRAMINE HCL 50 MG/ML IJ SOLN: 12.5000 mg | Freq: Four times a day (QID) | INTRAMUSCULAR | Status: DC | PRN

## 2024-01-23 MED ORDER — VITAMIN D (ERGOCALCIFEROL) 1.25 MG (50000 UNIT) PO CAPS
50000.0000 [IU] | ORAL_CAPSULE | ORAL | Status: DC
  Administered 2024-01-23 – 2024-02-13 (×4): 50000 [IU] via ORAL
  Filled 2024-01-23 (×4): qty 1

## 2024-01-23 MED ORDER — MAGNESIUM OXIDE -MG SUPPLEMENT 400 (240 MG) MG PO TABS
400.0000 mg | ORAL_TABLET | Freq: Two times a day (BID) | ORAL | Status: DC
  Administered 2024-01-23 – 2024-02-02 (×20): 400 mg via ORAL
  Filled 2024-01-23 (×20): qty 1

## 2024-01-23 MED ORDER — DIPHENHYDRAMINE HCL 12.5 MG/5ML PO ELIX
12.5000 mg | ORAL_SOLUTION | Freq: Four times a day (QID) | ORAL | Status: DC | PRN
  Administered 2024-02-08 – 2024-02-09 (×2): 12.5 mg via ORAL
  Filled 2024-01-23 (×2): qty 5

## 2024-01-23 MED ORDER — HYDROMORPHONE 1 MG/ML IV SOLN
INTRAVENOUS | Status: DC
  Administered 2024-01-24: 4 mg via INTRAVENOUS
  Administered 2024-01-24 (×2): 2 mg via INTRAVENOUS
  Administered 2024-01-24: 1 mg via INTRAVENOUS
  Administered 2024-01-24: 3 mg via INTRAVENOUS
  Administered 2024-01-24: 5 mg via INTRAVENOUS
  Administered 2024-01-25: 4 mg via INTRAVENOUS
  Administered 2024-01-25: 2 mg via INTRAVENOUS
  Administered 2024-01-25: 30 mg via INTRAVENOUS
  Administered 2024-01-25: 5 mg via INTRAVENOUS
  Administered 2024-01-25: 2 mg via INTRAVENOUS
  Administered 2024-01-25: 5 mg via INTRAVENOUS
  Administered 2024-01-25: 4 mg via INTRAVENOUS
  Administered 2024-01-26 (×2): 3 mg via INTRAVENOUS
  Administered 2024-01-26: 6 mg via INTRAVENOUS
  Administered 2024-01-26: 30 mg via INTRAVENOUS
  Administered 2024-01-27: 0.1 mg via INTRAVENOUS
  Administered 2024-01-27: 4 mg via INTRAVENOUS
  Administered 2024-01-27 (×2): 2 mg via INTRAVENOUS
  Administered 2024-01-28: 30 mg via INTRAVENOUS
  Administered 2024-01-28: 4 mg via INTRAVENOUS
  Administered 2024-01-28: 3 mg via INTRAVENOUS
  Administered 2024-01-28: 30 mg via INTRAVENOUS
  Administered 2024-01-28 – 2024-01-29 (×4): 3 mg via INTRAVENOUS
  Administered 2024-01-29: 6 mg via INTRAVENOUS
  Administered 2024-01-29: 3 mg via INTRAVENOUS
  Administered 2024-01-29: 1 mg via INTRAVENOUS
  Administered 2024-01-29: 3 mg via INTRAVENOUS
  Administered 2024-01-30: 5 mg via INTRAVENOUS
  Administered 2024-01-30: 2 mg via INTRAVENOUS
  Administered 2024-01-30: 3 mg via INTRAVENOUS
  Administered 2024-01-30: 5 mg via INTRAVENOUS
  Administered 2024-01-30: 1 mg via INTRAVENOUS
  Administered 2024-01-30: 30 mg via INTRAVENOUS
  Administered 2024-01-31: 6 mg via INTRAVENOUS
  Administered 2024-01-31: 5 mg via INTRAVENOUS
  Administered 2024-01-31: 8 mg via INTRAVENOUS
  Administered 2024-01-31: 30 mg via INTRAVENOUS
  Administered 2024-01-31 – 2024-02-01 (×2): 5 mg via INTRAVENOUS
  Administered 2024-02-01: 30 mg via INTRAVENOUS
  Administered 2024-02-01: 7 mg via INTRAVENOUS
  Administered 2024-02-01: 3 mg via INTRAVENOUS
  Administered 2024-02-01: 5 mg via INTRAVENOUS
  Administered 2024-02-01: 8 mg via INTRAVENOUS
  Administered 2024-02-02: 4 mg via INTRAVENOUS
  Administered 2024-02-02: 6 mg via INTRAVENOUS
  Administered 2024-02-02: 30 mg via INTRAVENOUS
  Administered 2024-02-02: 5 mg via INTRAVENOUS
  Administered 2024-02-02: 7 mg via INTRAVENOUS
  Administered 2024-02-03 (×2): 6 mg via INTRAVENOUS
  Administered 2024-02-03: 7 mg via INTRAVENOUS
  Administered 2024-02-03: 30 mg via INTRAVENOUS
  Administered 2024-02-03: 8 mg via INTRAVENOUS
  Administered 2024-02-04: 30 mg via INTRAVENOUS
  Administered 2024-02-04: 6 mg via INTRAVENOUS
  Administered 2024-02-04: 8 mg via INTRAVENOUS
  Administered 2024-02-04 (×2): 3 mg via INTRAVENOUS
  Administered 2024-02-04: 5 mg via INTRAVENOUS
  Administered 2024-02-05 (×2): 4 mg via INTRAVENOUS
  Administered 2024-02-05: 30 mg via INTRAVENOUS
  Administered 2024-02-05: 2 mg via INTRAVENOUS
  Administered 2024-02-05: 9 mg via INTRAVENOUS
  Administered 2024-02-05: 5 mg via INTRAVENOUS
  Administered 2024-02-05: 4 mg via INTRAVENOUS
  Administered 2024-02-05: 30 mg via INTRAVENOUS
  Administered 2024-02-06: 3 mg via INTRAVENOUS
  Administered 2024-02-06: 2 mg via INTRAVENOUS
  Administered 2024-02-06: 30 mg via INTRAVENOUS
  Administered 2024-02-06: 6 mg via INTRAVENOUS
  Administered 2024-02-06: 7 mg via INTRAVENOUS
  Administered 2024-02-07: 30 mg via INTRAVENOUS
  Administered 2024-02-07: 5 mg via INTRAVENOUS
  Administered 2024-02-07: 6 mg via INTRAVENOUS
  Administered 2024-02-08: 7 mg via INTRAVENOUS
  Administered 2024-02-08: 30 mg via INTRAVENOUS
  Administered 2024-02-08: 2 mg via INTRAVENOUS
  Administered 2024-02-08: 6 mg via INTRAVENOUS
  Administered 2024-02-08: 3 mg via INTRAVENOUS
  Administered 2024-02-09: 7 mg via INTRAVENOUS
  Administered 2024-02-09: 30 mg via INTRAVENOUS
  Administered 2024-02-09: 2 mg via INTRAVENOUS
  Administered 2024-02-09: 4 mg via INTRAVENOUS
  Administered 2024-02-10: 8 mg via INTRAVENOUS
  Administered 2024-02-10: 30 mg via INTRAVENOUS
  Administered 2024-02-10: 4 mg via INTRAVENOUS
  Administered 2024-02-10: 3 mg via INTRAVENOUS
  Administered 2024-02-10: 4 mg via INTRAVENOUS
  Administered 2024-02-10: 6 mg via INTRAVENOUS
  Administered 2024-02-11: 7 mg via INTRAVENOUS
  Administered 2024-02-11: 8 mg via INTRAVENOUS
  Administered 2024-02-11: 30 mg via INTRAVENOUS
  Administered 2024-02-12: 5 mg via INTRAVENOUS
  Administered 2024-02-12: 7 mg via INTRAVENOUS
  Administered 2024-02-12: 3 mg via INTRAVENOUS
  Administered 2024-02-12: 7 mg via INTRAVENOUS
  Administered 2024-02-12: 9 mg via INTRAVENOUS
  Administered 2024-02-12: 4 mg via INTRAVENOUS
  Administered 2024-02-12: 30 mg via INTRAVENOUS
  Administered 2024-02-13 (×2): 5 mg via INTRAVENOUS
  Administered 2024-02-13: 1 mg via INTRAVENOUS
  Administered 2024-02-13: 30 mg via INTRAVENOUS
  Administered 2024-02-13: 3 mg via INTRAVENOUS
  Administered 2024-02-13: 5 mL via INTRAVENOUS
  Administered 2024-02-14 (×2): 1 mg via INTRAVENOUS
  Administered 2024-02-14: 30 mg via INTRAVENOUS
  Administered 2024-02-14 (×2): 4 mg via INTRAVENOUS
  Administered 2024-02-14: 8 mg via INTRAVENOUS
  Administered 2024-02-14: 2.59 mg via INTRAVENOUS
  Administered 2024-02-15: 6 mg via INTRAVENOUS
  Administered 2024-02-15: 3 mg via INTRAVENOUS
  Administered 2024-02-15: 30 mg via INTRAVENOUS
  Administered 2024-02-15: 6 mg via INTRAVENOUS
  Administered 2024-02-15: 4 mg via INTRAVENOUS
  Administered 2024-02-15 – 2024-02-16 (×2): 6 mg via INTRAVENOUS
  Administered 2024-02-16: 4 mg via INTRAVENOUS
  Administered 2024-02-16: 9 mg via INTRAVENOUS
  Administered 2024-02-16: 8 mg via INTRAVENOUS
  Administered 2024-02-16: 0.1 mg via INTRAVENOUS
  Administered 2024-02-16: 9 mg via INTRAVENOUS
  Administered 2024-02-16: 6 mg via INTRAVENOUS
  Administered 2024-02-17: 8 mg via INTRAVENOUS
  Administered 2024-02-17: 5 mg via INTRAVENOUS
  Filled 2024-01-23 (×25): qty 30

## 2024-01-23 NOTE — Plan of Care (Signed)

## 2024-01-23 NOTE — TOC Progression Note (Addendum)
 Transition of Care [facility]) - Progression Note    Patient Details  Name: Misty Arnold MRN: [id number] Date of Birth: 04/29/1971  [facility] CM/SW Contact  Beckie Busing, RN Phone [phone]  01/23/2024, 9:53 AM  Clinical Narrative:    Cm attempted to call referral for home hospice to [facility]) [phone]. CM spoke with Gershon Cull in intake who states that agency can not accept referral with PCA.   TOC acknowledges new order for [facility] for patient with pleurx drain. The barrier continues to be home PCA avalaiability in the [facility] area. TOC will continue to follow.        Expected Discharge Plan and Services                                               Social Determinants of Health (SDOH) Interventions SDOH Screenings   Food Insecurity: No Food Insecurity (12/30/2023)  Recent Concern: Food Insecurity - Food Insecurity Present (12/11/2023)  Housing: High Risk (12/30/2023)  Transportation Needs: Unmet Transportation Needs (12/30/2023)  Utilities: Not At Risk (12/30/2023)  Depression (PHQ2-9): Medium Risk (02/15/2020)  Social Connections: Moderately Integrated (12/30/2023)  Tobacco Use: Low Risk  (12/30/2023)    Readmission Risk Interventions    01/01/2024    9:51 AM 10/12/2021    3:40 PM  Readmission Risk Prevention Plan  Transportation Screening Complete Complete  PCP or Specialist Appt within 5-7 Days Complete   PCP or Specialist Appt within 3-5 Days  Complete  Home Care Screening Complete   Medication Review (RN CM) Complete   HRI or Home Care Consult  Complete  Social Work Consult for Recovery Care Planning/Counseling  Complete  Medication Review Oceanographer)  Complete

## 2024-01-23 NOTE — Plan of Care (Signed)
  Problem: Activity: Goal: Risk for activity intolerance will decrease Outcome: Progressing   Problem: Elimination: Goal: Will not experience complications related to bowel motility Outcome: Progressing Goal: Will not experience complications related to urinary retention Outcome: Progressing   Problem: Pain Managment: Goal: General experience of comfort will improve and/or be controlled Outcome: Progressing   Problem: Safety: Goal: Ability to remain free from injury will improve Outcome: Progressing   Problem: Skin Integrity: Goal: Risk for impaired skin integrity will decrease Outcome: Progressing

## 2024-01-23 NOTE — Progress Notes (Signed)
 [facility] Progress Note  Patient: Misty Arnold     NFA:[id number]  DOA: 12/30/2023   PCP: Serena Croissant, MD       Brief hospital course: This is a 53 year old female with right sided metastatic breast cancer status post palliative chemo, radiation and mastectomy, chronic pain secondary to cancer, depression and anxiety who was admitted for uncontrolled pain. 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     Code Status: Limited: Do not attempt resuscitation (DNR) -DNR-LIMITED -Do Not Intubate/DNI  Total time on patient care: 35 minutes. DVT prophylaxis:  enoxaparin (LOVENOX) injection 40 mg Start: 01/21/24 1000 Place TED hose Start: 01/09/24 1043     Objective:   Vitals:   01/23/24 0442 01/23/24 0747 01/23/24 0937 01/23/24 1158  BP:   109/70   Pulse:   92   Resp: 14 16  18   Temp:      TempSrc:      SpO2:    94%  Weight:      Height:       Filed Weights   12/30/23 1220  Weight: 54.4 kg   Exam:  General: Appearance:    Thin female in no acute distress     Lungs:     respirations unlabored  Heart:    Normal heart rate.   MS:   All extremities are intact.   Neurologic:   Awake, alert        CBC: Recent Labs  Lab 01/20/24 0524   WBC 9.2  NEUTROABS 6.8  HGB 9.2*  HCT 31.6*  MCV 92.9  PLT 365   Basic Metabolic Panel: No results for input(s): "NA", "K", "CL", "CO2", "GLUCOSE", "BUN", "CREATININE", "CALCIUM", "MG", "PHOS" in the last 168 hours.    Scheduled Meds:  ammonium lactate   Topical BID   atenolol  50 mg Oral BID   celecoxib  200 mg Oral QHS   Chlorhexidine Gluconate Cloth  6 each Topical Daily   dexamethasone  4 mg Oral Daily   enoxaparin (LOVENOX) injection  40 mg Subcutaneous Q24H   feeding supplement  1 Container Oral TID BM   HYDROmorphone   Intravenous Q4H   lidocaine-EPINEPHrine  20 mL Intradermal Once   magnesium oxide  400 mg Oral Daily   mirtazapine  15 mg Oral QHS   multivitamins with iron  1 tablet Oral Daily   oxyCODONE  40 mg Oral Q8H   polyethylene glycol  17 g Oral BID   senna-docusate  1 tablet Oral BID   sodium chloride flush  10-40 mL Intracatheter Q12H    Imaging and lab data personally reviewed   Author: Joseph Art  01/23/2024 12:03 PM  To contact [facility]>   Check the care team in [facility]  and look for the attending/consulting [facility] provider listed  Log into [email or web address] and use [place]'s universal password   Go to> "[facility]"  and find provider  If you still have difficulty reaching the provider, please page the [facility] (Director on Call) for the Hospitalists listed on amion

## 2024-01-23 NOTE — Progress Notes (Signed)
 950 mL drained from pleurex catheter. Pt tolerated well. Pt stable, bed locked and in the lowest position, call light within reach, rise and fall of chest observed and care continues.

## 2024-01-24 DIAGNOSIS — G893 Neoplasm related pain (acute) (chronic): Secondary | ICD-10-CM | POA: Diagnosis not present

## 2024-01-24 NOTE — Progress Notes (Signed)
 Palliative Care Follow up Note  Patient is s/p Pleurx placement. Tolerated procedure well, complaining of very little pain. Feels like she can breathe much better. PCA usage is mostly stable.  We discussed the challenges of her discharge plan today and getting high level of hospice services in the [facility] area. She had planned to go to her grandmothers in [place]. She is nervous about being managed outside of the [employer] service area. Prior to this hospitalizaton she was looking for housing for herself and her mother closer to [place] in the [place] area.  We explored the possibility of her going to Assisted Living or a LTC bed with hospice services which would allow her to keep the PCA most likely.We discussed briefly her resources and she reports that she has financial support for the cost of her residential care. She is open to exploring this possibility if hospice services cannot meet her needs or be done in [place] area .    Continue to work towards d/c with hospice services.  Anderson Malta, DO Palliative Medicine

## 2024-01-24 NOTE — Plan of Care (Signed)

## 2024-01-24 NOTE — Progress Notes (Signed)
 [facility] Progress Note  Patient: Misty Arnold     ZOX:[id number]  DOA: 12/30/2023   PCP: Serena Croissant, MD       Brief hospital course: This is a 53 year old female with right sided metastatic breast cancer status post palliative chemo, radiation and mastectomy, chronic pain secondary to cancer, depression and anxiety who was admitted for uncontrolled pain. 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     Code Status: Limited: Do not attempt resuscitation (DNR) -DNR-LIMITED -Do Not Intubate/DNI  Total time on patient care: 35 minutes. DVT prophylaxis:  enoxaparin (LOVENOX) injection 40 mg Start: 01/21/24 1000 Place TED hose Start: 01/09/24 1043     Objective:   Vitals:   01/24/24 0703 01/24/24 0842 01/24/24 1100 01/24/24 1113  BP:   101/67   Pulse:      Resp: 16 18  18   Temp:      TempSrc:      SpO2: 98%     Weight:      Height:       Filed Weights   12/30/23 1220  Weight: 54.4 kg   Exam:  General: Appearance:    Thin female in no acute distress     Lungs:     respirations unlabored, pleurex in place  Heart:    Normal heart rate.   MS:   All extremities are intact.   Neurologic:   Awake, alert        CBC: Recent Labs  Lab 01/20/24 0524  WBC 9.2   NEUTROABS 6.8  HGB 9.2*  HCT 31.6*  MCV 92.9  PLT 365   Basic Metabolic Panel: No results for input(s): "NA", "K", "CL", "CO2", "GLUCOSE", "BUN", "CREATININE", "CALCIUM", "MG", "PHOS" in the last 168 hours.    Scheduled Meds:  ammonium lactate   Topical BID   atenolol  50 mg Oral BID   celecoxib  200 mg Oral QHS   Chlorhexidine Gluconate Cloth  6 each Topical Daily   dexamethasone  4 mg Oral Daily   enoxaparin (LOVENOX) injection  40 mg Subcutaneous Q24H   feeding supplement  1 Container Oral TID BM   HYDROmorphone   Intravenous Q4H   lidocaine-EPINEPHrine  20 mL Intradermal Once   magnesium oxide  400 mg Oral BID   mirtazapine  15 mg Oral QHS   multivitamins with iron  1 tablet Oral Daily   oxyCODONE  40 mg Oral Q8H   polyethylene glycol  17 g Oral BID   senna-docusate  1 tablet Oral BID   sodium chloride flush  10-40 mL Intracatheter Q12H   Vitamin D (Ergocalciferol)  50,000 Units Oral Q7 days    Imaging and lab data personally reviewed   Author: Joseph Art  01/24/2024 12:57 PM  To contact  [facility]>   Check the care team in [facility] and look for the attending/consulting [facility] provider listed  Log into [email or web address] and use 's universal password   Go to> "[facility]"  and find provider  If you still have difficulty reaching the provider, please page the [facility] (Director on Call) for the Hospitalists listed on amion

## 2024-01-25 DIAGNOSIS — G893 Neoplasm related pain (acute) (chronic): Secondary | ICD-10-CM | POA: Diagnosis not present

## 2024-01-25 MED ORDER — ORAL CARE MOUTH RINSE: 15.0000 mL | OROMUCOSAL | Status: DC | PRN

## 2024-01-25 MED ORDER — SODIUM CHLORIDE 0.9 % IV SOLN: INTRAVENOUS | Status: AC | PRN

## 2024-01-25 NOTE — Progress Notes (Signed)
 [facility] Progress Note  Patient: Misty Arnold     ZOX:[id number]  DOA: 12/30/2023   PCP: Serena Croissant, MD       Brief hospital course: This is a 53 year old female with right sided metastatic breast cancer status post palliative chemo, radiation and mastectomy, chronic pain secondary to cancer, depression and anxiety who was admitted for uncontrolled pain. 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     Code Status: Limited: Do not attempt resuscitation (DNR) -DNR-LIMITED -Do Not Intubate/DNI  Total time on patient care: 35 minutes. DVT prophylaxis:  enoxaparin (LOVENOX) injection 40 mg Start: 01/21/24 1000 Place TED hose Start: 01/09/24 1043     Objective:   Vitals:   01/25/24 0822 01/25/24 1216 01/25/24 1220 01/25/24 1339  BP:    134/86  Pulse:    90  Resp: 13 14 14 20   Temp:    97.8 F (36.6 C)  TempSrc:    Oral  SpO2:    96%  Weight:      Height:       Filed Weights   12/30/23 1220  Weight: 54.4 kg   Exam:  General: Appearance:    Thin female in no acute distress     Lungs:     respirations unlabored, pleurex in place  Heart:    Normal heart rate.   MS:   All extremities are intact.   Neurologic:   Awake, alert        CBC: Recent Labs  Lab  01/20/24 0524  WBC 9.2  NEUTROABS 6.8  HGB 9.2*  HCT 31.6*  MCV 92.9  PLT 365   Basic Metabolic Panel: No results for input(s): "NA", "K", "CL", "CO2", "GLUCOSE", "BUN", "CREATININE", "CALCIUM", "MG", "PHOS" in the last 168 hours.    Scheduled Meds:  ammonium lactate   Topical BID   atenolol  50 mg Oral BID   celecoxib  200 mg Oral QHS   Chlorhexidine Gluconate Cloth  6 each Topical Daily   dexamethasone  4 mg Oral Daily   enoxaparin (LOVENOX) injection  40 mg Subcutaneous Q24H   feeding supplement  1 Container Oral TID BM   HYDROmorphone   Intravenous Q4H   lidocaine-EPINEPHrine  20 mL Intradermal Once   magnesium oxide  400 mg Oral BID   mirtazapine  15 mg Oral QHS   multivitamins with iron  1 tablet Oral Daily   oxyCODONE  40 mg Oral Q8H   polyethylene glycol  17 g Oral BID   senna-docusate  1 tablet Oral BID   sodium chloride flush  10-40 mL Intracatheter Q12H   Vitamin D (Ergocalciferol)  50,000 Units Oral Q7 days    Imaging and lab data personally reviewed   Author: Joseph Art  01/25/2024 2:45 PM  To contact [facility]>   Check the care team in [facility] and look for the attending/consulting [facility] provider listed  Log into [email or web address] and use [place]'s universal password   Go to> "[facility]"  and find provider  If you still have difficulty reaching the provider, please page the [facility] (Director on Call) for the Hospitalists listed on amion

## 2024-01-26 NOTE — Plan of Care (Signed)
  Problem: Clinical Measurements: Goal: Will remain free from infection Outcome: Progressing   Problem: Activity: Goal: Risk for activity intolerance will decrease Outcome: Progressing   Problem: Pain Managment: Goal: General experience of comfort will improve and/or be controlled Outcome: Progressing   Problem: Safety: Goal: Ability to remain free from injury will improve Outcome: Progressing   Problem: Skin Integrity: Goal: Risk for impaired skin integrity will decrease Outcome: Progressing

## 2024-01-26 NOTE — Progress Notes (Signed)
 [facility] Progress Note  Patient: Misty Arnold     ZOX:[id number]  DOA: 12/30/2023   PCP: Serena Croissant, MD       Brief hospital course: This is a 53 year old female with right sided metastatic breast cancer status post palliative chemo, radiation and mastectomy, chronic pain secondary to cancer, depression and anxiety who was admitted for uncontrolled pain. 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     Code Status: Limited: Do not attempt resuscitation (DNR) -DNR-LIMITED -Do Not Intubate/DNI  Total time on patient care: 35 minutes. DVT prophylaxis:  enoxaparin (LOVENOX) injection 40 mg Start: 01/21/24 1000 Place TED hose Start: 01/09/24 1043     Objective:   Vitals:   01/25/24 2326 01/26/24 0500 01/26/24 0507 01/26/24 0735  BP:   100/66   Pulse:   74   Resp: 15 15 16 16   Temp:   (!) 97.4 F (36.3 C)   TempSrc:   Oral   SpO2:   100% 100%  Weight:      Height:       Filed Weights   12/30/23 1220  Weight: 54.4 kg   Exam:  In restroom    CBC: Recent Labs  Lab 01/20/24 0524  WBC 9.2  NEUTROABS 6.8  HGB 9.2*  HCT 31.6*  MCV 92.9  PLT 365   Basic Metabolic Panel: No results for input(s): "NA", "K", "CL", "CO2", "GLUCOSE", "BUN", "CREATININE", "CALCIUM", "MG",  "PHOS" in the last 168 hours.    Scheduled Meds:  ammonium lactate   Topical BID   atenolol  50 mg Oral BID   celecoxib  200 mg Oral QHS   Chlorhexidine Gluconate Cloth  6 each Topical Daily   dexamethasone  4 mg Oral Daily   enoxaparin (LOVENOX) injection  40 mg Subcutaneous Q24H   feeding supplement  1 Container Oral TID BM   HYDROmorphone   Intravenous Q4H   lidocaine-EPINEPHrine  20 mL Intradermal Once   magnesium oxide  400 mg Oral BID   mirtazapine  15 mg Oral QHS   multivitamins with iron  1 tablet Oral Daily   oxyCODONE  40 mg Oral Q8H   polyethylene glycol  17 g Oral BID   senna-docusate  1 tablet Oral BID   sodium chloride flush  10-40 mL Intracatheter Q12H   Vitamin D (Ergocalciferol)  50,000 Units Oral Q7 days    Imaging and lab data personally reviewed   Author: Joseph Art  01/26/2024 11:19 AM  To contact [facility]>   Check the care team in [facility] and look for the attending/consulting [facility] provider listed  Log into [email or web address] and use [place]'s universal password   Go to> "[facility]"  and find provider  If you still have difficulty reaching the provider, please  page the [facility] (Director on Call) for the Hospitalists listed on amion

## 2024-01-27 DIAGNOSIS — G893 Neoplasm related pain (acute) (chronic): Secondary | ICD-10-CM | POA: Diagnosis not present

## 2024-01-27 NOTE — Plan of Care (Signed)
  Problem: Health Behavior/Discharge Planning: Goal: Ability to manage health-related needs will improve Outcome: Not Progressing   Problem: Clinical Measurements: Goal: Ability to maintain clinical measurements within normal limits will improve Outcome: Not Progressing Goal: Will remain free from infection Outcome: Not Progressing Goal: Diagnostic test results will improve Outcome: Not Progressing Goal: Respiratory complications will improve Outcome: Not Progressing Goal: Cardiovascular complication will be avoided Outcome: Not Progressing   Problem: Activity: Goal: Risk for activity intolerance will decrease Outcome: Not Progressing   Problem: Nutrition: Goal: Adequate nutrition will be maintained Outcome: Not Progressing   Problem: Coping: Goal: Level of anxiety will decrease Outcome: Not Progressing   Problem: Elimination: Goal: Will not experience complications related to bowel motility Outcome: Not Progressing Goal: Will not experience complications related to urinary retention Outcome: Not Progressing   Problem: Pain Managment: Goal: General experience of comfort will improve and/or be controlled Outcome: Not Progressing   Problem: Safety: Goal: Ability to remain free from injury will improve Outcome: Not Progressing   Problem: Skin Integrity: Goal: Risk for impaired skin integrity will decrease Outcome: Not Progressing

## 2024-01-27 NOTE — Plan of Care (Signed)

## 2024-01-27 NOTE — Progress Notes (Signed)
 [facility] Progress Note  Patient: Misty Arnold     WJX:[id number]  DOA: 12/30/2023   PCP: Serena Croissant, MD       Brief hospital course: This is a 53 year old female with right sided metastatic breast cancer status post palliative chemo, radiation and mastectomy, chronic pain secondary to cancer, depression and anxiety who was admitted for uncontrolled pain. 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     Code Status: Limited: Do not attempt resuscitation (DNR) -DNR-LIMITED -Do Not Intubate/DNI  Total time on patient care: 35 minutes. DVT prophylaxis:  enoxaparin (LOVENOX) injection 40 mg Start: 01/21/24 1000 Place TED hose Start: 01/09/24 1043     Objective:   Vitals:   01/26/24 2335 01/27/24 0413 01/27/24 0626 01/27/24 0822  BP:   107/71   Pulse:   86   Resp: 16 16 18 18   Temp:   (!) 97.5 F (36.4 C)   TempSrc:      SpO2:   99% 99%  Weight:      Height:       Filed Weights   12/30/23 1220  Weight: 54.4 kg   Exam:  NAD, walking in room    CBC: No results for input(s): "WBC", "NEUTROABS", "HGB", "HCT", "MCV", "PLT" in the last 168 hours.  Basic Metabolic Panel: No results for input(s): "NA", "K", "CL", "CO2", "GLUCOSE", "BUN", "CREATININE", "CALCIUM", "MG",  "PHOS" in the last 168 hours.    Scheduled Meds:  ammonium lactate   Topical BID   atenolol  50 mg Oral BID   celecoxib  200 mg Oral QHS   Chlorhexidine Gluconate Cloth  6 each Topical Daily   dexamethasone  4 mg Oral Daily   enoxaparin (LOVENOX) injection  40 mg Subcutaneous Q24H   feeding supplement  1 Container Oral TID BM   HYDROmorphone   Intravenous Q4H   lidocaine-EPINEPHrine  20 mL Intradermal Once   magnesium oxide  400 mg Oral BID   mirtazapine  15 mg Oral QHS   multivitamins with iron  1 tablet Oral Daily   oxyCODONE  40 mg Oral Q8H   polyethylene glycol  17 g Oral BID   senna-docusate  1 tablet Oral BID   sodium chloride flush  10-40 mL Intracatheter Q12H   Vitamin D (Ergocalciferol)  50,000 Units Oral Q7 days    Imaging and lab data personally reviewed   Author: Joseph Art  01/27/2024 10:20 AM  To contact [facility]>   Check the care team in [facility] and look for the attending/consulting [facility] provider listed  Log into [email or web address] and use [facility] universal password   Go to> "[facility]"  and find provider  If you still have difficulty reaching the provider, please page the [facility] (Director on Call) for  the Hospitalists listed on amion

## 2024-01-28 DIAGNOSIS — G893 Neoplasm related pain (acute) (chronic): Secondary | ICD-10-CM | POA: Diagnosis not present

## 2024-01-28 NOTE — Progress Notes (Signed)
 [facility] Progress Note  Patient: Misty Arnold     ZOX:[id number]  DOA: 12/30/2023   PCP: Serena Croissant, MD       Brief hospital course: This is a 53 year old female with right sided metastatic breast cancer status post palliative chemo, radiation and mastectomy, chronic pain secondary to cancer, depression and anxiety who was admitted for uncontrolled pain. Now a placement/safe d/c  issue.  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     Code Status: Limited: Do not attempt resuscitation (DNR) -DNR-LIMITED -Do Not Intubate/DNI  Total time on patient care: 35 minutes. DVT prophylaxis:  enoxaparin (LOVENOX) injection 40 mg Start: 01/21/24 1000 Place TED hose Start: 01/09/24 1043     Objective:   Vitals:   01/28/24 0001 01/28/24 0509 01/28/24 0618 01/28/24 0826  BP:   103/63   Pulse:   90   Resp: 14 15 18 16   Temp:   98.7 F (37.1 C)   TempSrc:   Oral   SpO2: 94%  100%   Weight:      Height:       Filed Weights   12/30/23 1220  Weight: 54.4 kg   Exam:  In bed, NAD    CBC: No results for input(s): "WBC", "NEUTROABS", "HGB", "HCT", "MCV", "PLT" in the last 168 hours.  Basic Metabolic Panel: No results for input(s): "NA", "K",  "CL", "CO2", "GLUCOSE", "BUN", "CREATININE", "CALCIUM", "MG", "PHOS" in the last 168 hours.    Scheduled Meds:  ammonium lactate   Topical BID   atenolol  50 mg Oral BID   celecoxib  200 mg Oral QHS   Chlorhexidine Gluconate Cloth  6 each Topical Daily   dexamethasone  4 mg Oral Daily   enoxaparin (LOVENOX) injection  40 mg Subcutaneous Q24H   feeding supplement  1 Container Oral TID BM   HYDROmorphone   Intravenous Q4H   lidocaine-EPINEPHrine  20 mL Intradermal Once   magnesium oxide  400 mg Oral BID   mirtazapine  15 mg Oral QHS   multivitamins with iron  1 tablet Oral Daily   oxyCODONE  40 mg Oral Q8H   polyethylene glycol  17 g Oral BID   senna-docusate  1 tablet Oral BID   sodium chloride flush  10-40 mL Intracatheter Q12H   Vitamin D (Ergocalciferol)  50,000 Units Oral Q7 days    Imaging and lab data personally reviewed   Author: Joseph Art  01/28/2024 11:38 AM  To contact [facility]>   Check the care team in [facility] and look for the attending/consulting [facility] provider listed  Log into [email or web address] and use [facility] universal password   Go to> "[facility]"  and find provider  If you still have difficulty reaching the provider,  please page the [facility] (Director on Call) for the Hospitalists listed on amion

## 2024-01-28 NOTE — Plan of Care (Signed)
  Problem: Activity: Goal: Risk for activity intolerance will decrease Outcome: Progressing   Problem: Pain Managment: Goal: General experience of comfort will improve and/or be controlled Outcome: Progressing   Problem: Skin Integrity: Goal: Risk for impaired skin integrity will decrease Outcome: Progressing

## 2024-01-28 NOTE — Plan of Care (Signed)
  Problem: Activity: Goal: Risk for activity intolerance will decrease Outcome: Progressing   Problem: Coping: Goal: Level of anxiety will decrease Outcome: Progressing   Problem: Elimination: Goal: Will not experience complications related to urinary retention Outcome: Progressing   Problem: Pain Managment: Goal: General experience of comfort will improve and/or be controlled Outcome: Progressing   Problem: Safety: Goal: Ability to remain free from injury will improve Outcome: Progressing

## 2024-01-28 NOTE — Progress Notes (Signed)
 Palliative Care Progress Note  Misty Arnold is a 53 year old woman with metastatic breast cancer including a large malignant chest wall wound and recurrent malignant pleural effusion status postplacement of Pleurx catheter, additionally she requires a hydromorphone PCA for pain control in addition to scheduled extended release oxycodone 3 times a day.  She reports that her pain is under good control she has had significant relief with placement of the Pleurx catheter and finds this to be very helpful she also notices that there is decreased drainage from her breast wound she has very complex wound care needs surrounding this but she is able to do her own wound care and meticulously does this every day.  Additionally she reports that her appetite is improved and she is currently on daily steroids.  She continues to be in the hospital due to difficulty with placement options utilizing hospice services.  She is from the area of [place] in [place] in [place] but receives her cancer care here at [facility] and the [facility].  The discharge plan was initially for her to go to her grandmother's home in [place] with hospice services seeing her there however we have been unable to locate a hospice provider who can manage both her Pleurx and her PCA infusion for her comfort care.  Misty Arnold is aware that she has exhausted all medical treatment options for her cancer and that we will continue to treat her in the symptoms of her cancer including pain, nausea, dyspnea, wound care needs, appetite and maximize her quality of life for as long as possible.  Since we have been unable to determine a hospice provider for her to go to her grandmother's home in [place], I discussed with surgery for the option of possible long-term care bed with hospice or an assisted living location with hospice services seeing her at that level of care.  Will request the transitions of care expand her  discharge options beyond there were no original plan of going to [facility] since we have been able to locate a hospice provider.  Since hospitalized and since placement of Pleurx catheter she has been on a stable regimen of pain medications including the PCA as well as requiring drainage of her Pleurx catheter as needed.  Her PCA usage has been fairly consistent.  She continues to maintain a reasonably high level of functional status she is able to ambulate to the bathroom and back to her bed she is also able to carry out her own wound care to her malignant chest wall wound.  She continues to make decisions for herself and is engaged with her care plan.  She has expressed concerns about how she will be care for when she leaves the hospital.  She has requested that any information regarding her care plan only be shared with her and decisions made privately without including her significant other or other family members.  She is requesting privacy during this time but also states that she has some financial resources to help her obtain a place to live following this hospitalization including but not limited to a long-term care bed or assisted living bed.  Anderson Malta, DO Palliative Medicine  Time: 45 min

## 2024-01-29 DIAGNOSIS — G893 Neoplasm related pain (acute) (chronic): Secondary | ICD-10-CM | POA: Diagnosis not present

## 2024-01-29 NOTE — Plan of Care (Signed)
 Right Pleurx drained . Dressing changed, intact, sterile split gauze, Tegaderm in place. No signs of infection noted, skin is clean, dry, pink, intact.   Problem: Health Behavior/Discharge Planning: Goal: Ability to manage health-related needs will improve Outcome: Progressing   Problem: Clinical Measurements: Goal: Ability to maintain clinical measurements within normal limits will improve Outcome: Progressing Goal: Will remain free from infection Outcome: Progressing Goal: Diagnostic test results will improve Outcome: Progressing

## 2024-01-29 NOTE — Plan of Care (Signed)
  Problem: Health Behavior/Discharge Planning: Goal: Ability to manage health-related needs will improve Outcome: Progressing   Problem: Nutrition: Goal: Adequate nutrition will be maintained Outcome: Progressing   Problem: Coping: Goal: Level of anxiety will decrease Outcome: Progressing   Problem: Elimination: Goal: Will not experience complications related to bowel motility Outcome: Progressing Goal: Will not experience complications related to urinary retention Outcome: Progressing   Problem: Pain Managment: Goal: General experience of comfort will improve and/or be controlled Outcome: Progressing   Problem: Safety: Goal: Ability to remain free from injury will improve Outcome: Progressing   Problem: Skin Integrity: Goal: Risk for impaired skin integrity will decrease Outcome: Progressing

## 2024-01-29 NOTE — Progress Notes (Addendum)
 PROGRESS NOTE    Misty Arnold  WUJ:[id number] DOB: 1971-09-27 DOA: 12/30/2023 PCP: Serena Croissant, MD   Brief Narrative:  This 53 year old female with PMH significant for right sided metastatic breast cancer status post palliative chemo, radiation and mastectomy, chronic pain secondary to cancer, depression and anxiety who was admitted for uncontrolled pain. Now is a placement/safe d/c  issue.  Palliative care helping with disposition.   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 Plan is to transition her to home under hospice care with the PCA. TOC unable to find an agency to accept [place] in [place].   Metastasis from breast cancer [facility]) She has fungating mass across her right chest wall encroaching into her left chest wall Continue twice daily dressing changes Has extensive bony metastasis, mets to the liver and lungs Continue Decadron.   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 Status post 1.4 L removal via thoracentesis on 2/26. IR consulted for placement of Pleurx due to recurrent effusions 3/19 PRN drainage   Underweight Body mass index is 19.97 kg/m.   DVT prophylaxis: Lovenox Code Status: DNR/DNI Family Communication: (Fianc at bedside) Disposition Plan:   Status is: Inpatient Remains inpatient appropriate because: Pending safe disposition.   Consultants:  Oncology  Procedures:   Antimicrobials:  Anti-infectives (From admission, onward)    Start     Dose/Rate Route Frequency Ordered Stop   01/20/24 1500  vancomycin (VANCOCIN) IVPB 1000 mg/200 mL premix        1,000 mg 200 mL/hr over 60 Minutes Intravenous To Radiology 01/19/24 0824 01/22/24 1618       Subjective: Patient was seen and examined at bedside.Overnight events noted.   She reports pain is reasonably controlled on Dilaudid PCA. She is awaiting safe disposition.  Objective: Vitals:   01/28/24 2354 01/29/24 0407 01/29/24 0540 01/29/24 0950  BP:   93/61   Pulse:   88   Resp: 18 18 18 18   Temp:   97.7 F (36.5 C)   TempSrc:   Oral   SpO2: 98%  97%   Weight:      Height:        Intake/Output Summary (Last 24 hours) at 01/29/2024 1007 Last data filed at 01/29/2024 0746 Gross per 24 hour  Intake 0 ml  Output 500 ml  Net -500 ml   Filed Weights   12/30/23 1220  Weight: 54.4 kg    Examination:  General exam: Appears calm and comfortable,  deconditioned, not in any distress. Respiratory system: CTA Bilaterally. Respiratory effort normal. RR 17 Cardiovascular system: S1 & S2 heard, RRR. No JVD, murmurs, rubs, gallops or clicks. Gastrointestinal system: Abdomen is non distended, soft and non tender. Normal bowel sounds heard. Central nervous system: Alert and oriented x 3. No focal neurological deficits. Extremities: No edema, No cyanosis, No clubbing  Skin: No rashes, lesions or ulcers Psychiatry: Judgement and insight appear normal. Mood & affect appropriate.   Data Reviewed: I have personally reviewed following labs and imaging studies  CBC: No results for input(s): "WBC", "NEUTROABS", "HGB", "HCT", "MCV", "PLT" in the last 168 hours. Basic Metabolic Panel: No results for input(s): "NA", "K", "CL", "CO2", "GLUCOSE", "BUN", "CREATININE", "CALCIUM", "MG", "PHOS" in the  last 168 hours. GFR: Estimated Creatinine Clearance: 65.8 mL/min (by C-G formula based on SCr of 0.84 mg/dL). Liver Function Tests: No results for input(s): "AST", "ALT", "ALKPHOS", "BILITOT", "PROT", "ALBUMIN" in the last 168 hours. No results for input(s): "LIPASE", "AMYLASE" in the last 168 hours. No results for input(s): "AMMONIA" in the last 168 hours. Coagulation Profile: No results for  input(s): "INR", "PROTIME" in the last 168 hours. Cardiac Enzymes: No results for input(s): "CKTOTAL", "CKMB", "CKMBINDEX", "TROPONINI" in the last 168 hours. BNP (last 3 results) No results for input(s): "PROBNP" in the last 8760 hours. HbA1C: No results for input(s): "HGBA1C" in the last 72 hours. CBG: No results for input(s): "GLUCAP" in the last 168 hours. Lipid Profile: No results for input(s): "CHOL", "HDL", "LDLCALC", "TRIG", "CHOLHDL", "LDLDIRECT" in the last 72 hours. Thyroid Function Tests: No results for input(s): "TSH", "T4TOTAL", "FREET4", "T3FREE", "THYROIDAB" in the last 72 hours. Anemia Panel: No results for input(s): "VITAMINB12", "FOLATE", "FERRITIN", "TIBC", "IRON", "RETICCTPCT" in the last 72 hours. Sepsis Labs: No results for input(s): "PROCALCITON", "LATICACIDVEN" in the last 168 hours.  No results found for this or any previous visit (from the past 240 hours).   Radiology Studies: No results found.  Scheduled Meds:  ammonium lactate   Topical BID   atenolol  50 mg Oral BID   celecoxib  200 mg Oral QHS   Chlorhexidine Gluconate Cloth  6 each Topical Daily   dexamethasone  4 mg Oral Daily   enoxaparin (LOVENOX) injection  40 mg Subcutaneous Q24H   feeding supplement  1 Container Oral TID BM   HYDROmorphone   Intravenous Q4H   lidocaine-EPINEPHrine  20 mL Intradermal Once   magnesium oxide  400 mg Oral BID   mirtazapine  15 mg Oral QHS   multivitamins with iron  1 tablet Oral Daily   oxyCODONE  40 mg Oral Q8H   polyethylene glycol  17 g Oral BID   senna-docusate  1 tablet Oral BID   sodium chloride flush  10-40 mL Intracatheter Q12H   Vitamin D (Ergocalciferol)  50,000 Units Oral Q7 days   Continuous Infusions:   LOS: 30 days    Time spent: 50 MINS    Willeen Niece, MD [facility]   If 7PM-7AM, please contact night-coverage

## 2024-01-30 DIAGNOSIS — G893 Neoplasm related pain (acute) (chronic): Secondary | ICD-10-CM | POA: Diagnosis not present

## 2024-01-30 NOTE — Plan of Care (Signed)
  Problem: Clinical Measurements: Goal: Will remain free from infection Outcome: Progressing   Problem: Activity: Goal: Risk for activity intolerance will decrease Outcome: Progressing   Problem: Nutrition: Goal: Adequate nutrition will be maintained Outcome: Progressing   Problem: Elimination: Goal: Will not experience complications related to bowel motility Outcome: Progressing Goal: Will not experience complications related to urinary retention Outcome: Progressing   Problem: Pain Managment: Goal: General experience of comfort will improve and/or be controlled Outcome: Progressing   Problem: Safety: Goal: Ability to remain free from injury will improve Outcome: Progressing   Problem: Skin Integrity: Goal: Risk for impaired skin integrity will decrease Outcome: Progressing

## 2024-01-30 NOTE — Progress Notes (Signed)
 PROGRESS NOTE    Misty Arnold  WUJ:[id number] DOB: [birth year] DOA: 12/30/2023 PCP: Serena Croissant, MD   Brief Narrative:  This 53 year old female with PMH significant for right sided metastatic breast cancer status post palliative chemo, radiation and mastectomy, chronic pain secondary to cancer, depression and anxiety who was admitted for uncontrolled pain. Now is a placement/safe d/c  issue.  Palliative care helping with disposition.   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 Plan is to transition her to home under hospice care with the PCA. TOC unable to find an agency to accept her in [place].   Metastasis from breast cancer [facility]) She has fungating mass across her right chest wall encroaching into her left chest wall Continue twice daily dressing changes. Has extensive bony metastasis, mets to the liver and lungs Continue Decadron.   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 Status post 1.4 L removal via thoracentesis on 2/26. IR consulted for placement of Pleurx due to recurrent effusions 3/19 PRN drainage.   Underweight Body mass index is 19.97 kg/m.   DVT prophylaxis: Lovenox Code Status: DNR/DNI Family Communication: (Fianc at bedside) Disposition Plan:   Status is: Inpatient Remains inpatient appropriate because: Pending safe disposition.   Consultants:  Oncology  Procedures:   Antimicrobials:  Anti-infectives (From admission, onward)    Start     Dose/Rate Route Frequency Ordered Stop   01/20/24 1500  vancomycin (VANCOCIN) IVPB 1000 mg/200 mL premix        1,000 mg 200 mL/hr over 60 Minutes Intravenous To Radiology 01/19/24 0824 01/22/24 1618       Subjective: Patient was seen and examined at bedside. Overnight events noted.   She reports pain is reasonably controlled on Dilaudid PCA. She is awaiting safe disposition.  Patient denies any other concerns.  Objective: Vitals:   01/30/24 0245 01/30/24 0450 01/30/24 0517 01/30/24 0731  BP:   102/69   Pulse:   83   Resp: 18 18 16 16   Temp:   (!) 97.4 F (36.3 C)   TempSrc:   Oral   SpO2:   98%   Weight:      Height:        Intake/Output Summary (Last 24 hours) at 01/30/2024 0941 Last data filed at 01/29/2024 1822 Gross per 24 hour  Intake --  Output 500 ml  Net -500 ml   Filed Weights   12/30/23 1220  Weight: 54.4 kg    Examination:  General exam: Appears calm and comfortable,  deconditioned, not in any distress. Respiratory system: CTA Bilaterally. Respiratory effort normal. RR 15 Cardiovascular system: S1 & S2 heard, RRR. No JVD, murmurs, rubs, gallops or clicks. Gastrointestinal system: Abdomen is non distended, soft and non tender. Normal bowel sounds heard. Central nervous system: Alert and oriented x 3. No focal neurological deficits. Extremities: No edema, No cyanosis, No clubbing  Skin: No rashes, lesions or ulcers Psychiatry: Judgement and insight appear normal. Mood & affect appropriate.   Data Reviewed: I have personally reviewed following labs and imaging studies  CBC: No results for input(s): "WBC", "NEUTROABS", "HGB", "HCT", "MCV", "PLT" in the last 168 hours. Basic Metabolic Panel: No results for input(s): "NA", "K", "CL", "CO2", "GLUCOSE", "  BUN", "CREATININE", "CALCIUM", "MG", "PHOS" in the last 168 hours. GFR: Estimated Creatinine Clearance: 65.8 mL/min (by C-G formula based on SCr of 0.84 mg/dL). Liver Function Tests: No results for input(s): "AST", "ALT", "ALKPHOS", "BILITOT", "PROT", "ALBUMIN" in the last 168 hours. No results for input(s): "LIPASE", "AMYLASE" in the last 168 hours. No results for input(s): "AMMONIA" in the last 168  hours. Coagulation Profile: No results for input(s): "INR", "PROTIME" in the last 168 hours. Cardiac Enzymes: No results for input(s): "CKTOTAL", "CKMB", "CKMBINDEX", "TROPONINI" in the last 168 hours. BNP (last 3 results) No results for input(s): "PROBNP" in the last 8760 hours. HbA1C: No results for input(s): "HGBA1C" in the last 72 hours. CBG: No results for input(s): "GLUCAP" in the last 168 hours. Lipid Profile: No results for input(s): "CHOL", "HDL", "LDLCALC", "TRIG", "CHOLHDL", "LDLDIRECT" in the last 72 hours. Thyroid Function Tests: No results for input(s): "TSH", "T4TOTAL", "FREET4", "T3FREE", "THYROIDAB" in the last 72 hours. Anemia Panel: No results for input(s): "VITAMINB12", "FOLATE", "FERRITIN", "TIBC", "IRON", "RETICCTPCT" in the last 72 hours. Sepsis Labs: No results for input(s): "PROCALCITON", "LATICACIDVEN" in the last 168 hours.  No results found for this or any previous visit (from the past 240 hours).   Radiology Studies: No results found.  Scheduled Meds:  ammonium lactate   Topical BID   atenolol  50 mg Oral BID   celecoxib  200 mg Oral QHS   Chlorhexidine Gluconate Cloth  6 each Topical Daily   dexamethasone  4 mg Oral Daily   enoxaparin (LOVENOX) injection  40 mg Subcutaneous Q24H   feeding supplement  1 Container Oral TID BM   HYDROmorphone   Intravenous Q4H   lidocaine-EPINEPHrine  20 mL Intradermal Once   magnesium oxide  400 mg Oral BID   mirtazapine  15 mg Oral QHS   multivitamins with iron  1 tablet Oral Daily   oxyCODONE  40 mg Oral Q8H   polyethylene glycol  17 g Oral BID   senna-docusate  1 tablet Oral BID   sodium chloride flush  10-40 mL Intracatheter Q12H   Vitamin D (Ergocalciferol)  50,000 Units Oral Q7 days   Continuous Infusions:   LOS: 31 days    Time spent: 35 MINS    Willeen Niece, MD [facility]   If 7PM-7AM, please contact night-coverage

## 2024-01-31 DIAGNOSIS — G893 Neoplasm related pain (acute) (chronic): Secondary | ICD-10-CM | POA: Diagnosis not present

## 2024-01-31 NOTE — Plan of Care (Signed)
  Problem: Health Behavior/Discharge Planning: Goal: Ability to manage health-related needs will improve Outcome: Progressing   Problem: Clinical Measurements: Goal: Ability to maintain clinical measurements within normal limits will improve Outcome: Progressing   Problem: Activity: Goal: Risk for activity intolerance will decrease Outcome: Progressing   Problem: Nutrition: Goal: Adequate nutrition will be maintained Outcome: Progressing   Problem: Coping: Goal: Level of anxiety will decrease Outcome: Progressing   Problem: Pain Managment: Goal: General experience of comfort will improve and/or be controlled Outcome: Progressing   Problem: Safety: Goal: Ability to remain free from injury will improve Outcome: Progressing
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
## 2024-01-31 NOTE — Plan of Care (Signed)
  Problem: Clinical Measurements: Goal: Will remain free from infection Outcome: Progressing   Problem: Activity: Goal: Risk for activity intolerance will decrease Outcome: Progressing   Problem: Pain Managment: Goal: General experience of comfort will improve and/or be controlled Outcome: Progressing   Problem: Safety: Goal: Ability to remain free from injury will improve Outcome: Progressing   Problem: Skin Integrity: Goal: Risk for impaired skin integrity will decrease Outcome: Progressing

## 2024-01-31 NOTE — Progress Notes (Signed)
 PROGRESS NOTE    Misty Arnold  XBJ:[id number] DOB: 1971/07/02 DOA: 12/30/2023 PCP: Serena Croissant, MD   Brief Narrative:  This 53 year old female with PMH significant for right sided metastatic breast cancer status post palliative chemo, radiation and mastectomy, chronic pain secondary to cancer, depression and anxiety who was admitted for uncontrolled pain. Now is a placement/safe d/c  issue.  Palliative care helping with disposition.   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 Plan is to transition her to home under hospice care with the PCA. TOC unable to find an agency to accept her in [place].   Metastasis from breast cancer [facility]) She has fungating mass across her right chest wall encroaching into her left chest wall Continue twice daily dressing changes. Has extensive bony metastasis, mets to the liver and lungs Continue Decadron.   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 Status post 1.4 L removal via thoracentesis on 2/26. IR consulted for placement of Pleurx due to recurrent effusions 3/19 PRN drainage.   Underweight Body mass index is 19.97 kg/m.   DVT prophylaxis: Lovenox Code Status: DNR/DNI Family Communication: No family at bed side Disposition Plan:   Status is: Inpatient Remains inpatient appropriate because: Pending safe disposition.   Consultants:  Oncology  Procedures:   Antimicrobials:  Anti-infectives (From admission, onward)    Start     Dose/Rate Route Frequency Ordered Stop   01/20/24 1500  vancomycin (VANCOCIN) IVPB 1000 mg/200 mL premix        1,000 mg 200 mL/hr over 60 Minutes Intravenous To Radiology 01/19/24 0824 01/22/24 1618       Subjective: Patient was seen and examined at bedside. Overnight events noted.   She reports pain is reasonably controlled on Dilaudid PCA. She is awaiting safe disposition.  Patient denies any other concerns.  Objective: Vitals:   01/31/24 0641 01/31/24 0733 01/31/24 1203 01/31/24 1405  BP: 104/60   103/66  Pulse: 85   93  Resp: 14 16 17 20   Temp: 98.2 F (36.8 C)   98 F (36.7 C)  TempSrc: Oral   Oral  SpO2: 93%   97%  Weight:      Height:        Intake/Output Summary (Last 24 hours) at 01/31/2024 1410 Last data filed at 01/31/2024 0957 Gross per 24 hour  Intake 240 ml  Output --  Net 240 ml   Filed Weights   12/30/23 1220  Weight: 54.4 kg    Examination:  General exam: Appears calm and comfortable,  deconditioned, not in any distress. Respiratory system: CTA Bilaterally. Respiratory effort normal. RR 14 Cardiovascular system: S1 & S2 heard, RRR. No JVD, murmurs, rubs, gallops or clicks. Gastrointestinal system: Abdomen is non distended, soft and non tender. Normal bowel sounds heard. Central nervous system: Alert and oriented x 3. No focal neurological deficits. Extremities: No edema, No cyanosis, No clubbing  Skin: No rashes, lesions or ulcers Psychiatry: Judgement and insight appear normal. Mood & affect appropriate.   Data Reviewed: I have personally reviewed following labs and imaging studies  CBC: No results for input(s): "WBC", "NEUTROABS", "HGB", "HCT", "MCV", "PLT" in the last 168 hours. Basic Metabolic Panel: No results for input(s): "NA", "  K", "CL", "CO2", "GLUCOSE", "BUN", "CREATININE", "CALCIUM", "MG", "PHOS" in the last 168 hours. GFR: Estimated Creatinine Clearance: 65.8 mL/min (by C-G formula based on SCr of 0.84 mg/dL). Liver Function Tests: No results for input(s): "AST", "ALT", "ALKPHOS", "BILITOT", "PROT", "ALBUMIN" in the last 168 hours. No results for input(s): "LIPASE", "AMYLASE" in the last 168 hours. No results for input(s): "AMMONIA"  in the last 168 hours. Coagulation Profile: No results for input(s): "INR", "PROTIME" in the last 168 hours. Cardiac Enzymes: No results for input(s): "CKTOTAL", "CKMB", "CKMBINDEX", "TROPONINI" in the last 168 hours. BNP (last 3 results) No results for input(s): "PROBNP" in the last 8760 hours. HbA1C: No results for input(s): "HGBA1C" in the last 72 hours. CBG: No results for input(s): "GLUCAP" in the last 168 hours. Lipid Profile: No results for input(s): "CHOL", "HDL", "LDLCALC", "TRIG", "CHOLHDL", "LDLDIRECT" in the last 72 hours. Thyroid Function Tests: No results for input(s): "TSH", "T4TOTAL", "FREET4", "T3FREE", "THYROIDAB" in the last 72 hours. Anemia Panel: No results for input(s): "VITAMINB12", "FOLATE", "FERRITIN", "TIBC", "IRON", "RETICCTPCT" in the last 72 hours. Sepsis Labs: No results for input(s): "PROCALCITON", "LATICACIDVEN" in the last 168 hours.  No results found for this or any previous visit (from the past 240 hours).   Radiology Studies: No results found.  Scheduled Meds:  ammonium lactate   Topical BID   atenolol  50 mg Oral BID   celecoxib  200 mg Oral QHS   Chlorhexidine Gluconate Cloth  6 each Topical Daily   dexamethasone  4 mg Oral Daily   enoxaparin (LOVENOX) injection  40 mg Subcutaneous Q24H   feeding supplement  1 Container Oral TID BM   HYDROmorphone   Intravenous Q4H   lidocaine-EPINEPHrine  20 mL Intradermal Once   magnesium oxide  400 mg Oral BID   mirtazapine  15 mg Oral QHS   multivitamins with iron  1 tablet Oral Daily   oxyCODONE  40 mg Oral Q8H   polyethylene glycol  17 g Oral BID   senna-docusate  1 tablet Oral BID   sodium chloride flush  10-40 mL Intracatheter Q12H   Vitamin D (Ergocalciferol)  50,000 Units Oral Q7 days   Continuous Infusions:   LOS: 32 days    Time spent: 35 MINS    Willeen Niece, MD [facility]   If 7PM-7AM, please contact night-coverage

## 2024-01-31 NOTE — TOC Progression Note (Signed)
 Transition of Care [facility]) - Progression Note    Patient Details  Name: Misty Arnold MRN: [id number] Date of Birth: [birth year]  Transition [facility]) CM/SW Contact  Adrian Prows, RN Phone Number: 01/31/2024, 2:24 PM  Clinical Narrative:    Remains on dilaudid PCA; TOC is following.        Expected Discharge Plan and Services                                               Social Determinants of Health (SDOH) Interventions SDOH Screenings   Food Insecurity: No Food Insecurity (12/30/2023)  Recent Concern: Food Insecurity - Food Insecurity Present (12/11/2023)  Housing: High Risk (12/30/2023)  Transportation Needs: Unmet Transportation Needs (12/30/2023)  Utilities: Not At Risk (12/30/2023)  Depression (PHQ2-9): Medium Risk (02/15/2020)  Social Connections: Moderately Integrated (12/30/2023)  Tobacco Use: Low Risk  (12/30/2023)    Readmission Risk Interventions    01/01/2024    9:51 AM 10/12/2021    3:40 PM  Readmission Risk Prevention Plan  Transportation Screening Complete Complete  PCP or Specialist Appt within 5-7 Days Complete   PCP or Specialist Appt within 3-5 Days  Complete  Home Care Screening Complete   Medication Review (RN CM) Complete   HRI or Home Care Consult  Complete  Social Work Consult for Recovery Care Planning/Counseling  Complete  Medication Review Ocean[employer])  Complete

## 2024-02-01 DIAGNOSIS — G893 Neoplasm related pain (acute) (chronic): Secondary | ICD-10-CM | POA: Diagnosis not present

## 2024-02-01 NOTE — Progress Notes (Signed)
 PROGRESS NOTE    Gracia Saggese  WJX:[id number] DOB: [birth year] DOA: 12/30/2023 PCP: Serena Croissant, MD   Brief Narrative:  This 53 year old female with PMH significant for right sided metastatic breast cancer status post palliative chemo, radiation and mastectomy, chronic pain secondary to cancer, depression and anxiety who was admitted for uncontrolled pain. Now is a placement/safe d/c  issue.  Palliative care helping with disposition.   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 Plan is to transition her to home under hospice care with the PCA. TOC unable to find an agency to accept her in [place].   Metastasis from breast cancer [facility]) She has fungating mass across her right chest wall encroaching into her left chest wall Continue twice daily dressing changes. Has extensive bony metastasis, mets to the liver and lungs Continue Decadron.   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 Status post 1.4 L removal via thoracentesis on 2/26. IR consulted for placement of Pleurx due to recurrent effusions 3/19 PRN drainage.   Underweight Body mass index is 19.97 kg/m.   DVT prophylaxis: Lovenox Code Status: DNR/DNI Family Communication: No family at bed side Disposition Plan:   Status is: Inpatient Remains inpatient appropriate because: Pending safe disposition.   Consultants:  Oncology  Procedures:   Antimicrobials:  Anti-infectives (From admission, onward)    Start     Dose/Rate Route Frequency Ordered Stop   01/20/24 1500  vancomycin (VANCOCIN) IVPB 1000 mg/200 mL premix        1,000 mg 200 mL/hr over 60 Minutes Intravenous To Radiology 01/19/24 0824 01/22/24 1618       Subjective: Patient was seen and examined at bedside. Overnight events noted.   She reports pain is reasonably controlled on Dilaudid PCA. She is awaiting safe disposition.  Patient reports no concerns.  Objective: Vitals:   01/31/24 2336 02/01/24 0508 02/01/24 0527 02/01/24 0801  BP:   116/71   Pulse:   98   Resp: 16 16 18 18   Temp:   98.4 F (36.9 C)   TempSrc:      SpO2:   100%   Weight:      Height:        Intake/Output Summary (Last 24 hours) at 02/01/2024 1336 Last data filed at 01/31/2024 1420 Gross per 24 hour  Intake 240 ml  Output 350 ml  Net -110 ml   Filed Weights   12/30/23 1220  Weight: 54.4 kg    Examination:  General exam: Appears calm and comfortable,  deconditioned, not in any distress. Respiratory system: CTA Bilaterally. Respiratory effort normal. RR 14 Cardiovascular system: S1 & S2 heard, RRR. No JVD, murmurs, rubs, gallops or clicks. Gastrointestinal system: Abdomen is non distended, soft and non tender. Normal bowel sounds heard. Central nervous system: Alert and oriented x 3. No focal neurological deficits. Extremities: No edema, No cyanosis, No clubbing  Skin: No rashes, lesions or ulcers Psychiatry: Judgement and insight appear normal. Mood & affect appropriate.   Data Reviewed: I have personally reviewed following labs and imaging studies  CBC: No results for input(s): "WBC", "NEUTROABS", "HGB", "HCT", "MCV", "PLT" in the last 168 hours. Basic Metabolic Panel: No results for input(s): "NA", "K", "CL", "CO2", "  GLUCOSE", "BUN", "CREATININE", "CALCIUM", "MG", "PHOS" in the last 168 hours. GFR: Estimated Creatinine Clearance: 65.8 mL/min (by C-G formula based on SCr of 0.84 mg/dL). Liver Function Tests: No results for input(s): "AST", "ALT", "ALKPHOS", "BILITOT", "PROT", "ALBUMIN" in the last 168 hours. No results for input(s): "LIPASE", "AMYLASE" in the last 168 hours. No results for input(s): "AMMONIA" in the last 168  hours. Coagulation Profile: No results for input(s): "INR", "PROTIME" in the last 168 hours. Cardiac Enzymes: No results for input(s): "CKTOTAL", "CKMB", "CKMBINDEX", "TROPONINI" in the last 168 hours. BNP (last 3 results) No results for input(s): "PROBNP" in the last 8760 hours. HbA1C: No results for input(s): "HGBA1C" in the last 72 hours. CBG: No results for input(s): "GLUCAP" in the last 168 hours. Lipid Profile: No results for input(s): "CHOL", "HDL", "LDLCALC", "TRIG", "CHOLHDL", "LDLDIRECT" in the last 72 hours. Thyroid Function Tests: No results for input(s): "TSH", "T4TOTAL", "FREET4", "T3FREE", "THYROIDAB" in the last 72 hours. Anemia Panel: No results for input(s): "VITAMINB12", "FOLATE", "FERRITIN", "TIBC", "IRON", "RETICCTPCT" in the last 72 hours. Sepsis Labs: No results for input(s): "PROCALCITON", "LATICACIDVEN" in the last 168 hours.  No results found for this or any previous visit (from the past 240 hours).   Radiology Studies: No results found.  Scheduled Meds:  ammonium lactate   Topical BID   atenolol  50 mg Oral BID   celecoxib  200 mg Oral QHS   Chlorhexidine Gluconate Cloth  6 each Topical Daily   dexamethasone  4 mg Oral Daily   enoxaparin (LOVENOX) injection  40 mg Subcutaneous Q24H   feeding supplement  1 Container Oral TID BM   HYDROmorphone   Intravenous Q4H   lidocaine-EPINEPHrine  20 mL Intradermal Once   magnesium oxide  400 mg Oral BID   mirtazapine  15 mg Oral QHS   multivitamins with iron  1 tablet Oral Daily   oxyCODONE  40 mg Oral Q8H   polyethylene glycol  17 g Oral BID   senna-docusate  1 tablet Oral BID   sodium chloride flush  10-40 mL Intracatheter Q12H   Vitamin D (Ergocalciferol)  50,000 Units Oral Q7 days   Continuous Infusions:   LOS: 33 days    Time spent: 35 MINS    Willeen Niece, MD [facility]   If 7PM-7AM, please contact night-coverage

## 2024-02-02 DIAGNOSIS — G893 Neoplasm related pain (acute) (chronic): Secondary | ICD-10-CM | POA: Diagnosis not present

## 2024-02-02 LAB — BASIC METABOLIC PANEL WITH GFR
Anion gap: 6 (ref 5–15)
BUN: 11 mg/dL (ref 6–20)
CO2: 29 mmol/L (ref 22–32)
Calcium: 9.6 mg/dL (ref 8.9–10.3)
Chloride: 102 mmol/L (ref 98–111)
Creatinine, Ser: 0.89 mg/dL (ref 0.44–1.00)
GFR, Estimated: >60 mL/min (ref >60)
Glucose, Bld: 74 mg/dL (ref 70–99)
Potassium: 4.2 mmol/L (ref 3.5–5.1)
Sodium: 137 mmol/L (ref 135–145)

## 2024-02-02 LAB — CBC
HCT: 31.4 % — ABNORMAL LOW (ref 36.0–46.0)
Hemoglobin: 9.4 g/dL — ABNORMAL LOW (ref 12.0–15.0)
MCH: 27 pg (ref 26.0–34.0)
MCHC: 29.9 g/dL — ABNORMAL LOW (ref 30.0–36.0)
MCV: 90.2 fL (ref 80.0–100.0)
Platelets: 508 10*3/uL — ABNORMAL HIGH (ref 150–400)
RBC: 3.48 MIL/uL — ABNORMAL LOW (ref 3.87–5.11)
RDW: 19.9 % — ABNORMAL HIGH (ref 11.5–15.5)
WBC: 9.6 10*3/uL (ref 4.0–10.5)
nRBC: 0 % (ref 0.0–0.2)

## 2024-02-02 LAB — MAGNESIUM: Magnesium: 2.7 mg/dL — ABNORMAL HIGH (ref 1.7–2.4)

## 2024-02-02 LAB — PHOSPHORUS: Phosphorus: 2.2 mg/dL — ABNORMAL LOW (ref 2.5–4.6)

## 2024-02-02 MED ORDER — SODIUM PHOSPHATES 45 MMOLE/15ML IV SOLN
15.0000 mmol | Freq: Once | INTRAVENOUS | Status: AC
  Administered 2024-02-02: 15 mmol via INTRAVENOUS
  Filled 2024-02-02: qty 5

## 2024-02-02 NOTE — Progress Notes (Signed)
 PROGRESS NOTE    Misty Arnold  ZOX:[id number] DOB: 1971-02-26 DOA: 12/30/2023 PCP: Serena Croissant, MD   Brief Narrative:  This 53 year old female with PMH significant for right sided metastatic breast cancer status post palliative chemo, radiation and mastectomy, chronic pain secondary to cancer, depression and anxiety who was admitted for uncontrolled pain. Now is a placement/safe d/c  issue.  Palliative care helping with disposition.   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 Plan is to transition her to home under hospice care with the PCA. TOC unable to find an agency to accept [place] in [place].   Metastasis from breast cancer [facility]) She has fungating mass across her right chest wall encroaching into her left chest wall Continue twice daily dressing changes. Has extensive bony metastasis, mets to the liver and lungs Continue Decadron.   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 Status post 1.4 L removal via thoracentesis on 2/26. IR consulted for placement of Pleurx due to recurrent effusions 3/19 PRN drainage.   Underweight Body mass index is 19.97 kg/m.   DVT prophylaxis: Lovenox Code Status: DNR/DNI Family Communication: No family at bed side Disposition Plan:   Status is: Inpatient Remains inpatient appropriate because: Pending safe disposition.   Consultants:  Oncology  Procedures:   Antimicrobials:  Anti-infectives (From admission, onward)    Start     Dose/Rate Route Frequency Ordered Stop   01/20/24 1500  vancomycin (VANCOCIN) IVPB 1000 mg/200 mL premix        1,000 mg 200 mL/hr over 60 Minutes Intravenous To Radiology 01/19/24 0824 01/22/24 1618       Subjective: Patient was seen and examined at bedside. Overnight events noted.   She reports pain is reasonably controlled on Dilaudid PCA. She is awaiting safe disposition.  She denies any other concerns. Objective: Vitals:   02/01/24 2318 02/02/24 0500 02/02/24 0815 02/02/24 1047  BP:      Pulse:      Resp: 14 15 17 19   Temp:      TempSrc:      SpO2:   96% 95%  Weight:      Height:        Intake/Output Summary (Last 24 hours) at 02/02/2024 1136 Last data filed at 02/01/2024 1700 Gross per 24 hour  Intake --  Output 400 ml  Net -400 ml   Filed Weights   12/30/23 1220  Weight: 54.4 kg    Examination:  General exam: Appears calm and comfortable,  deconditioned, not in any distress. Respiratory system: CTA Bilaterally. Respiratory effort normal. RR 14 Cardiovascular system: S1 & S2 heard, RRR. No JVD, murmurs, rubs, gallops or clicks. Gastrointestinal system: Abdomen is non distended, soft and non tender. Normal bowel sounds heard. Central nervous system: Alert and oriented x 3. No focal neurological deficits. Extremities: No edema, No cyanosis, No clubbing  Skin: No rashes, lesions or ulcers Psychiatry: Judgement and insight appear normal. Mood & affect appropriate.   Data Reviewed: I have personally reviewed following labs and imaging studies  CBC: Recent Labs  Lab 02/02/24 0635  WBC 9.6  HGB 9.4*  HCT 31.4*  MCV 90.2  PLT 508*   Basic Metabolic Panel: Recent Labs  Lab  02/02/24 0635  NA 137  K 4.2  CL 102  CO2 29  GLUCOSE 74  BUN 11  CREATININE 0.89  CALCIUM 9.6  MG 2.7*  PHOS 2.2*   GFR: Estimated Creatinine Clearance: 62.1 mL/min (by C-G formula based on SCr of 0.89 mg/dL). Liver Function Tests: No results for input(s): "AST", "ALT", "ALKPHOS", "BILITOT", "PROT", "ALBUMIN" in the last 168 hours. No results for input(s): "LIPASE", "AMYLASE" in the last 168 hours. No results for input(s): "AMMONIA" in the last 168 hours. Coagulation  Profile: No results for input(s): "INR", "PROTIME" in the last 168 hours. Cardiac Enzymes: No results for input(s): "CKTOTAL", "CKMB", "CKMBINDEX", "TROPONINI" in the last 168 hours. BNP (last 3 results) No results for input(s): "PROBNP" in the last 8760 hours. HbA1C: No results for input(s): "HGBA1C" in the last 72 hours. CBG: No results for input(s): "GLUCAP" in the last 168 hours. Lipid Profile: No results for input(s): "CHOL", "HDL", "LDLCALC", "TRIG", "CHOLHDL", "LDLDIRECT" in the last 72 hours. Thyroid Function Tests: No results for input(s): "TSH", "T4TOTAL", "FREET4", "T3FREE", "THYROIDAB" in the last 72 hours. Anemia Panel: No results for input(s): "VITAMINB12", "FOLATE", "FERRITIN", "TIBC", "IRON", "RETICCTPCT" in the last 72 hours. Sepsis Labs: No results for input(s): "PROCALCITON", "LATICACIDVEN" in the last 168 hours.  No results found for this or any previous visit (from the past 240 hours).   Radiology Studies: No results found.  Scheduled Meds:  ammonium lactate   Topical BID   atenolol  50 mg Oral BID   celecoxib  200 mg Oral QHS   Chlorhexidine Gluconate Cloth  6 each Topical Daily   dexamethasone  4 mg Oral Daily   enoxaparin (LOVENOX) injection  40 mg Subcutaneous Q24H   feeding supplement  1 Container Oral TID BM   HYDROmorphone   Intravenous Q4H   lidocaine-EPINEPHrine  20 mL Intradermal Once   magnesium oxide  400 mg Oral BID   mirtazapine  15 mg Oral QHS   multivitamins with iron  1 tablet Oral Daily   oxyCODONE  40 mg Oral Q8H   polyethylene glycol  17 g Oral BID   senna-docusate  1 tablet Oral BID   sodium chloride flush  10-40 mL Intracatheter Q12H   Vitamin D (Ergocalciferol)  50,000 Units Oral Q7 days   Continuous Infusions:   LOS: 34 days    Time spent: 35 MINS    Willeen Niece, MD [facility]   If 7PM-7AM, please contact night-coverage

## 2024-02-02 NOTE — Plan of Care (Signed)

## 2024-02-02 NOTE — Plan of Care (Signed)
  Problem: Activity: Goal: Risk for activity intolerance will decrease Outcome: Progressing   Problem: Coping: Goal: Level of anxiety will decrease Outcome: Progressing   Problem: Elimination: Goal: Will not experience complications related to bowel motility Outcome: Progressing   Problem: Pain Managment: Goal: General experience of comfort will improve and/or be controlled Outcome: Progressing

## 2024-02-03 DIAGNOSIS — G893 Neoplasm related pain (acute) (chronic): Secondary | ICD-10-CM | POA: Diagnosis not present

## 2024-02-03 NOTE — Progress Notes (Signed)
 PROGRESS NOTE    Misty Arnold  WUJ:[id number] DOB: November 12, 1970 DOA: 12/30/2023 PCP: Serena Croissant, MD   Brief Narrative:  This 53 year old female with PMH significant for right sided metastatic breast cancer status post palliative chemo, radiation and mastectomy, chronic pain secondary to cancer, depression and anxiety who was admitted for uncontrolled pain. Now is a placement/safe d/c  issue.  Palliative care helping with disposition.   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 Plan is to transition her to home under hospice care with the PCA. TOC unable to find an agency to accept her in [place].   Metastasis from breast cancer [facility]) She has fungating mass across her right chest wall encroaching into her left chest wall Continue twice daily dressing changes. Has extensive bony metastasis, mets to the liver and lungs. Continue Decadron.   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 Status post 1.4 L removal via thoracentesis on 2/26. IR consulted for placement of Pleurx due to recurrent effusions 3/19 PRN drainage.   Underweight Body mass index is 19.97 kg/m.   DVT prophylaxis: Lovenox Code Status: DNR/DNI Family Communication: No family at bed side Disposition Plan:   Status is: Inpatient Remains inpatient appropriate because: Pending safe disposition.   Consultants:  Oncology  Procedures:   Antimicrobials:  Anti-infectives (From admission, onward)    Start     Dose/Rate Route Frequency Ordered Stop   01/20/24 1500  vancomycin (VANCOCIN) IVPB 1000 mg/200 mL premix        1,000 mg 200 mL/hr over 60 Minutes Intravenous To Radiology 01/19/24 0824 01/22/24 1618       Subjective: Patient was seen and examined at bedside. Overnight events noted.   She reports pain is reasonably controlled on Dilaudid PCA. She is awaiting safe disposition.  Patient denies any other concerns.  Objective: Vitals:   02/03/24 0504 02/03/24 0511 02/03/24 0833 02/03/24 1005  BP:  (!) 99/59  (!) 99/59  Pulse:  84  84  Resp: 15 18 19    Temp:  98 F (36.7 C)    TempSrc:  Oral    SpO2:  96%    Weight:      Height:        Intake/Output Summary (Last 24 hours) at 02/03/2024 1319 Last data filed at 02/03/2024 0934 Gross per 24 hour  Intake 341.12 ml  Output 450 ml  Net -108.88 ml   Filed Weights   12/30/23 1220  Weight: 54.4 kg    Examination:  General exam: Appears calm and comfortable,  deconditioned, not in any distress.   Respiratory system: CTA Bilaterally. Respiratory effort normal. RR 14 Cardiovascular system: S1 & S2 heard, RRR. No JVD, murmurs, rubs, gallops or clicks. Gastrointestinal system: Abdomen is non distended, soft and non tender. Normal bowel sounds heard. Central nervous system: Alert and oriented x 3. No focal neurological deficits. Extremities: No edema, No cyanosis, No clubbing  Skin: No rashes, lesions or ulcers Psychiatry: Judgement and insight appear normal. Mood & affect appropriate.   Data Reviewed: I have personally reviewed following labs and imaging studies  CBC: Recent Labs  Lab 02/02/24 0635  WBC 9.6  HGB 9.4*  HCT 31.4*  MCV 90.2  PLT 508*  Basic Metabolic Panel: Recent Labs  Lab 02/02/24 0635  NA 137  K 4.2  CL 102  CO2 29  GLUCOSE 74  BUN 11  CREATININE 0.89  CALCIUM 9.6  MG 2.7*  PHOS 2.2*   GFR: Estimated Creatinine Clearance: 62.1 mL/min (by C-G formula based on SCr of 0.89 mg/dL). Liver Function Tests: No results for input(s): "AST", "ALT", "ALKPHOS", "BILITOT", "PROT", "ALBUMIN" in the last 168 hours. No results for input(s): "LIPASE", "AMYLASE" in the last 168 hours. No results for input(s):  "AMMONIA" in the last 168 hours. Coagulation Profile: No results for input(s): "INR", "PROTIME" in the last 168 hours. Cardiac Enzymes: No results for input(s): "CKTOTAL", "CKMB", "CKMBINDEX", "TROPONINI" in the last 168 hours. BNP (last 3 results) No results for input(s): "PROBNP" in the last 8760 hours. HbA1C: No results for input(s): "HGBA1C" in the last 72 hours. CBG: No results for input(s): "GLUCAP" in the last 168 hours. Lipid Profile: No results for input(s): "CHOL", "HDL", "LDLCALC", "TRIG", "CHOLHDL", "LDLDIRECT" in the last 72 hours. Thyroid Function Tests: No results for input(s): "TSH", "T4TOTAL", "FREET4", "T3FREE", "THYROIDAB" in the last 72 hours. Anemia Panel: No results for input(s): "VITAMINB12", "FOLATE", "FERRITIN", "TIBC", "IRON", "RETICCTPCT" in the last 72 hours. Sepsis Labs: No results for input(s): "PROCALCITON", "LATICACIDVEN" in the last 168 hours.  No results found for this or any previous visit (from the past 240 hours).   Radiology Studies: No results found.  Scheduled Meds:  ammonium lactate   Topical BID   atenolol  50 mg Oral BID   celecoxib  200 mg Oral QHS   Chlorhexidine Gluconate Cloth  6 each Topical Daily   dexamethasone  4 mg Oral Daily   enoxaparin (LOVENOX) injection  40 mg Subcutaneous Q24H   feeding supplement  1 Container Oral TID BM   HYDROmorphone   Intravenous Q4H   lidocaine-EPINEPHrine  20 mL Intradermal Once   mirtazapine  15 mg Oral QHS   multivitamins with iron  1 tablet Oral Daily   oxyCODONE  40 mg Oral Q8H   polyethylene glycol  17 g Oral BID   senna-docusate  1 tablet Oral BID   sodium chloride flush  10-40 mL Intracatheter Q12H   Vitamin D (Ergocalciferol)  50,000 Units Oral Q7 days   Continuous Infusions:   LOS: 35 days    Time spent: 35 MINS    Willeen Niece, MD [facility]   If 7PM-7AM, please contact night-coverage

## 2024-02-03 NOTE — Plan of Care (Signed)

## 2024-02-04 DIAGNOSIS — G893 Neoplasm related pain (acute) (chronic): Secondary | ICD-10-CM | POA: Diagnosis not present

## 2024-02-04 NOTE — Plan of Care (Signed)

## 2024-02-04 NOTE — Progress Notes (Signed)
 PROGRESS NOTE    Misty Arnold  ZOX:[id number] DOB: 1971/04/24 DOA: 12/30/2023 PCP: Serena Croissant, MD   Brief Narrative:  This 53 year old female with PMH significant for right sided metastatic breast cancer status post palliative chemo, radiation and mastectomy, chronic pain secondary to cancer, depression and anxiety who was admitted for uncontrolled pain. Now is a placement/safe d/c  issue.  Palliative care helping with disposition.   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 Plan is to transition her to home under hospice care with the PCA. TOC unable to find an agency to accept her in [place].   Metastasis from breast cancer [facility]) She has fungating mass across her right chest wall encroaching into her left chest wall Continue twice daily dressing changes. Has extensive bony metastasis, mets to the liver and lungs. Continue Decadron.   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 Status post 1.4 L removal via thoracentesis on 2/26. IR consulted for placement of Pleurx due to recurrent effusions 3/19 PRN drainage.   Underweight Body mass index is 19.97 kg/m.   DVT prophylaxis: Lovenox Code Status: DNR/DNI Family Communication: No family at bed side Disposition Plan:   Status is: Inpatient Remains inpatient appropriate because: Pending safe disposition.   Consultants:  Oncology  Procedures:   Antimicrobials:  Anti-infectives (From admission, onward)    Start     Dose/Rate Route Frequency Ordered Stop   01/20/24 1500  vancomycin (VANCOCIN) IVPB 1000 mg/200 mL premix        1,000 mg 200 mL/hr over 60 Minutes Intravenous To Radiology 01/19/24 0824 01/22/24 1618       Subjective: Patient was seen and examined at bedside. Overnight events noted.   She reports pain is reasonably controlled on Dilaudid PCA. She is awaiting safe disposition.  Patient denies any other concerns.  Objective: Vitals:   02/04/24 0540 02/04/24 0726 02/04/24 1048 02/04/24 1100  BP: (!) 91/59  (!) 96/57   Pulse: 85  88   Resp: 18 18  16   Temp: 98.3 F (36.8 C)     TempSrc: Oral     SpO2: 92%  94%   Weight:      Height:       No intake or output data in the 24 hours ending 02/04/24 1429  Filed Weights   12/30/23 1220  Weight: 54.4 kg    Examination:  General exam: Appears calm and comfortable,  deconditioned, not in any distress.   Respiratory system: CTA Bilaterally. Respiratory effort normal. RR 13 Cardiovascular system: S1 & S2 heard, RRR. No JVD, murmurs, rubs, gallops or clicks. Gastrointestinal system: Abdomen is non distended, soft and non tender. Normal bowel sounds heard. Central nervous system: Alert and oriented x 3. No focal neurological deficits. Extremities: No edema, No cyanosis, No clubbing  Skin: No rashes, lesions or ulcers Psychiatry: Judgement and insight appear normal. Mood & affect appropriate.   Data Reviewed: I have personally reviewed following labs and imaging studies  CBC: Recent Labs  Lab 02/02/24 0635  WBC 9.6  HGB 9.4*  HCT 31.4*  MCV 90.2  PLT 508*   Basic Metabolic Panel: Recent Labs  Lab 02/02/24 0635  NA 137  K 4.2  CL 102  CO2 29  GLUCOSE 74  BUN 11  CREATININE 0.89  CALCIUM 9.6  MG 2.7*  PHOS 2.2*   GFR: Estimated Creatinine Clearance: 62.1 mL/min (by C-G formula based on SCr of 0.89 mg/dL). Liver Function Tests: No results for input(s): "AST", "ALT", "ALKPHOS", "BILITOT", "PROT", "ALBUMIN" in the last 168 hours. No results for input(s): "LIPASE", "AMYLASE" in the last 168 hours. No results for input(s): "AMMONIA" in the last 168 hours. Coagulation Profile: No results for input(s): "INR", "PROTIME" in  the last 168 hours. Cardiac Enzymes: No results for input(s): "CKTOTAL", "CKMB", "CKMBINDEX", "TROPONINI" in the last 168 hours. BNP (last 3 results) No results for input(s): "PROBNP" in the last 8760 hours. HbA1C: No results for input(s): "HGBA1C" in the last 72 hours. CBG: No results for input(s): "GLUCAP" in the last 168 hours. Lipid Profile: No results for input(s): "CHOL", "HDL", "LDLCALC", "TRIG", "CHOLHDL", "LDLDIRECT" in the last 72 hours. Thyroid Function Tests: No results for input(s): "TSH", "T4TOTAL", "FREET4", "T3FREE", "THYROIDAB" in the last 72 hours. Anemia Panel: No results for input(s): "VITAMINB12", "FOLATE", "FERRITIN", "TIBC", "IRON", "RETICCTPCT" in the last 72 hours. Sepsis Labs: No results for input(s): "PROCALCITON", "LATICACIDVEN" in the last 168 hours.  No results found for this or any previous visit (from the past 240 hours).   Radiology Studies: No results found.  Scheduled Meds:  ammonium lactate   Topical BID   atenolol  50 mg Oral BID   celecoxib  200 mg Oral QHS   Chlorhexidine Gluconate Cloth  6 each Topical Daily   dexamethasone  4 mg Oral Daily   enoxaparin (LOVENOX) injection  40 mg Subcutaneous Q24H   feeding supplement  1 Container Oral TID BM   HYDROmorphone   Intravenous Q4H   lidocaine-EPINEPHrine  20 mL Intradermal Once   mirtazapine  15 mg Oral QHS   multivitamins with iron  1 tablet Oral Daily   oxyCODONE  40 mg Oral Q8H   polyethylene glycol  17 g Oral BID   senna-docusate  1 tablet Oral BID   sodium chloride flush  10-40 mL Intracatheter Q12H   Vitamin D (Ergocalciferol)  50,000 Units Oral Q7 days   Continuous Infusions:   LOS: 36 days    Time spent: 35 MINS    Willeen Niece, MD [facility]   If 7PM-7AM, please contact night-coverage

## 2024-02-05 DIAGNOSIS — G893 Neoplasm related pain (acute) (chronic): Secondary | ICD-10-CM | POA: Diagnosis not present

## 2024-02-05 MED ORDER — FLUCONAZOLE 100 MG PO TABS
100.0000 mg | ORAL_TABLET | Freq: Every day | ORAL | Status: AC
  Administered 2024-02-06 – 2024-02-08 (×3): 100 mg via ORAL
  Filled 2024-02-05 (×3): qty 1

## 2024-02-05 MED ORDER — MAGIC MOUTHWASH
5.0000 mL | Freq: Three times a day (TID) | ORAL | Status: DC
  Administered 2024-02-05 – 2024-02-17 (×34): 5 mL via ORAL
  Filled 2024-02-05 (×37): qty 5

## 2024-02-05 NOTE — Plan of Care (Signed)

## 2024-02-05 NOTE — Progress Notes (Signed)
 Palliative Care Progress Note  Misty Arnold is a 53 year old woman with metastatic breast cancer including a large malignant chest wall wound and recurrent malignant pleural effusion status postplacement of Pleurx catheter, additionally she requires a hydromorphone PCA for pain control in addition to scheduled extended release oxycodone 3 times a day.   She reports that her pain is under good control she has had significant relief with placement of the Pleurx catheter.  She continues to be in the hospital due to difficulty with placement options utilizing hospice services.  She is from the area of [place] in [place] in [place] but receives her cancer care here at [facility] and the [facility].  The discharge plan was initially for her to go to her grandmother's home in [place] with hospice services seeing her there however we have been unable to locate a hospice provider who can manage both her Pleurx and her PCA infusion for her comfort care.  Misty Arnold is aware that she has exhausted all medical treatment options for her cancer and that we will continue to treat her in the symptoms of her cancer including pain, nausea, dyspnea, wound care needs, appetite and maximize her quality of life for as long as possible.   Since we have been unable to determine a hospice provider for her to go to her grandmother's home in [place], I discussed with surgery for the option of possible long-term care bed with hospice or an assisted living location with hospice services seeing her at that level of care. Will request the transitions of care expand her discharge options beyond there were no original plan of going to [facility] since we have been able to locate a hospice provider. Since hospitalized and since placement of Pleurx catheter she has been on a stable regimen of pain medications including the PCA as well as requiring drainage of her Pleurx catheter as needed. Her PCA usage has  been fairly consistent. She continues to maintain a reasonably high level of functional status she is able to ambulate to the bathroom and back to her bed she is also able to carry out her own wound care to her malignant chest wall wound. She continues to make decisions for herself and is engaged with her care plan.   She has expressed concerns about how she will be care for when she leaves the hospital.  She has requested that any information regarding her care plan only be shared with her and decisions made privately without including her significant other or other family members. She required a room relocation after her fiancee would not leave her room.causing her stress and emotional upset.  I think it would be helpful if TOC could discuss an Assisted Living or LTC bed option with hospice services caring for her at the facility or residence including maintaining her pump and taking care of the Pleurx. She is open to options in the [facility]. She also has a private pay source.  Maintain current interventions and symptom management.  Last weight 2/24 120lbs-will check her weight x1. Wound is mostly unchanged.  Misty Malta, DO Palliative Medicine   Time: 35 minutes

## 2024-02-05 NOTE — Progress Notes (Signed)
 PROGRESS NOTE    Misty Arnold  ZOX:[id number] DOB: 1971/07/27 DOA: 12/30/2023 PCP: Serena Croissant, MD   Brief Narrative:  53 year old female with PMH significant for right sided metastatic breast cancer status post palliative chemo, radiation and mastectomy, chronic pain secondary to cancer, depression and anxiety who was admitted for uncontrolled pain. Now is a placement/safe d/c issue. Palliative care helping with dispositio   Assessment & Plan:   Cancer associated pain: She is currently on Dilaudid PCA. Plan is to transition her to home under hospice care with the PCA. TOC unable to find an agency to accept her in [place].   Metastasis from breast cancer [facility]) She has fungating mass across her right chest wall encroaching into her left chest wall Continue twice daily dressing changes. Has extensive bony metastasis, mets to the liver and lungs. Continue Decadron.   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 Status post 1.4 L removal via thoracentesis on 2/26. Status post right Pleurx catheter placement by IR.  Continue as needed drainage   underweight -Follow nutrition recommendations  Anemia of chronic disease -From cancer.  Thrombocytosis -Possibly reactive   DVT prophylaxis: Lovenox Code Status: DNR Family Communication: None at bedside Disposition Plan: Status is: Inpatient Remains inpatient appropriate because: Pending safe disposition    Consultants: Oncology/palliative care/IR  Procedures: As above  Antimicrobials: None currently   Subjective: Patient seen and examined at bedside.  Pain is reasonably well-controlled on Dilaudid PCA.  No fever or vomiting reported.  Objective: Vitals:   02/05/24 0500 02/05/24 0719 02/05/24 0801 02/05/24 0818  BP:      Pulse:      Resp: 20 20 20 18   Temp:      TempSrc:      SpO2:      Weight:      Height:        Intake/Output Summary (Last 24 hours) at 02/05/2024 0822 Last data filed at 02/04/2024  1422 Gross per 24 hour  Intake --  Output 0 ml  Net 0 ml   Filed Weights   12/30/23 1220  Weight: 54.4 kg    Examination:  General exam: Appears calm and comfortable.  Looks chronically ill and deconditioned. Respiratory system: Bilateral decreased breath sounds at bases Cardiovascular system: S1 & S2 heard, Rate controlled Gastrointestinal system: Abdomen is distended, soft and nontender. bowel sounds heard. Extremities: No cyanosis, clubbing, edema  Central nervous system: Alert and oriented. No focal neurological deficits. Moving extremities Skin: No ecchymosis/petechiae Psychiatry: Flat affect.  Not agitated.   Data Reviewed: I have personally reviewed following labs and imaging studies  CBC: Recent Labs  Lab 02/02/24 0635  WBC 9.6  HGB 9.4*  HCT 31.4*  MCV 90.2  PLT 508*   Basic Metabolic Panel: Recent Labs  Lab 02/02/24 0635  NA 137  K 4.2  CL 102  CO2 29  GLUCOSE 74  BUN 11  CREATININE 0.89  CALCIUM 9.6  MG 2.7*  PHOS 2.2*   GFR: Estimated Creatinine Clearance: 62.1 mL/min (by C-G formula based on SCr of 0.89 mg/dL). Liver Function Tests: No results for input(s): "AST", "ALT", "ALKPHOS", "BILITOT", "PROT", "ALBUMIN" in the last 168 hours. No results for input(s): "LIPASE", "AMYLASE" in the last 168 hours. No results for input(s): "AMMONIA" in the last 168 hours. Coagulation Profile: No results for input(s): "INR", "PROTIME" in the last 168 hours. Cardiac Enzymes: No results for input(s): "CKTOTAL", "CKMB", "CKMBINDEX", "TROPONINI" in the last 168 hours. BNP (last 3 results) No results for input(s): "  PROBNP" in the last 8760 hours. HbA1C: No results for input(s): "HGBA1C" in the last 72 hours. CBG: No results for input(s): "GLUCAP" in the last 168 hours. Lipid Profile: No results for input(s): "CHOL", "HDL", "LDLCALC", "TRIG", "CHOLHDL", "LDLDIRECT" in the last 72 hours. Thyroid Function Tests: No results for input(s): "TSH", "T4TOTAL",  "FREET4", "T3FREE", "THYROIDAB" in the last 72 hours. Anemia Panel: No results for input(s): "VITAMINB12", "FOLATE", "FERRITIN", "TIBC", "IRON", "RETICCTPCT" in the last 72 hours. Sepsis Labs: No results for input(s): "PROCALCITON", "LATICACIDVEN" in the last 168 hours.  No results found for this or any previous visit (from the past 240 hours).       Radiology Studies: No results found.      Scheduled Meds:  ammonium lactate   Topical BID   atenolol  50 mg Oral BID   celecoxib  200 mg Oral QHS   Chlorhexidine Gluconate Cloth  6 each Topical Daily   dexamethasone  4 mg Oral Daily   enoxaparin (LOVENOX) injection  40 mg Subcutaneous Q24H   feeding supplement  1 Container Oral TID BM   HYDROmorphone   Intravenous Q4H   lidocaine-EPINEPHrine  20 mL Intradermal Once   mirtazapine  15 mg Oral QHS   multivitamins with iron  1 tablet Oral Daily   oxyCODONE  40 mg Oral Q8H   polyethylene glycol  17 g Oral BID   senna-docusate  1 tablet Oral BID   sodium chloride flush  10-40 mL Intracatheter Q12H   Vitamin D (Ergocalciferol)  50,000 Units Oral Q7 days   Continuous Infusions:        Glade Lloyd, MD [facility] 02/05/2024, 8:22 AM

## 2024-02-06 DIAGNOSIS — G893 Neoplasm related pain (acute) (chronic): Secondary | ICD-10-CM | POA: Diagnosis not present

## 2024-02-06 NOTE — TOC Progression Note (Addendum)
 Transition of Care [facility]) - Progression Note    Patient Details  Name: Misty Arnold MRN: [id number] Date of Birth: September 28, 1971  Transition of Care [facility]) CM/SW Contact  Beckie Busing, RN Phone [phone]  02/06/2024, 11:25 AM  Clinical Narrative:    TOC continues to follow. Per notes recommendation is for LTC bed with hospice following vs ALF with hospice following. The barrier for these placement options would be payor source. Medicare will not pay for long term care or assisted living. CM at bedside to discuss with patient. Patient is currently in the restroom changing dressings which usually takes hours. CM will attempt to see patient again later.   1156 CM has reached out to liaison from [facility] as a resource to determine if LTAC would be an option. Awaiting response. This is not an attempt for authorization. Cm is trying to look at disposition options for this patient.   1400 CM at bedside to discuss disposition options with patient. Patient states that she has been working with Dr. Phillips Odor to control pain and to determine where she will go when discharged. Patient verbalizes an understanding of barriers for Hospice in [place]. CM inquires about mention of assisted living  or LTC facility. Patient states that  Dr, Phillips Odor is working on helping patient with placement. CM has explained to patient that [employer] will not cover and patient will need to meet criteria. Patient is pleasant but continues to say that Dr. Phillips Odor is assisting her with this . Patient states she really just wants to move to [facility] and find somewhere to stay and go home for self care. CM inquires if patient is currently able to drain her pleurx catheter. Patient states that she is unable to drain catheter due to its location. Patient states that home health nurse will be able to drain catheter, CM explains that [facility] will not take ownership of the pleurx catheter and that there will need to be a trainable  caregiver if patient is unable to drain. Patient continues to state that if she is close enough that Dr. Phillips Odor  can get the pleurx drained. CM has reiterated that [facility] will not accept without trainable caregiver. Patient verbalizes understanding of conversation but states that Dr, Phillips Odor is working on things and that she will follow up with her. Patient has states that she would not like to go to [facility] at this point. CM has cancelled requested inquiry for criteria for LTACH. TOC will continue to follow but currently TOC has no resolution for disposition plan.         Expected Discharge Plan and Services                                               Social Determinants of Health (SDOH) Interventions SDOH Screenings   Food Insecurity: No Food Insecurity (12/30/2023)  Recent Concern: Food Insecurity - Food Insecurity Present (12/11/2023)  Housing: High Risk (12/30/2023)  Transportation Needs: Unmet Transportation Needs (12/30/2023)  Utilities: Not At Risk (12/30/2023)  Depression (PHQ2-9): Medium Risk (02/15/2020)  Social Connections: Moderately Integrated (12/30/2023)  Tobacco Use: Low Risk  (12/30/2023)    Readmission Risk Interventions    01/01/2024    9:51 AM 10/12/2021    3:40 PM  Readmission Risk Prevention Plan  Transportation Screening Complete Complete  PCP or Specialist Appt within 5-7 Days Complete   PCP or  Specialist Appt within 3-5 Days  Complete  Home Care Screening Complete   Medication Review (RN CM) Complete   HRI or Home Care Consult  Complete  Social Work Consult for Recovery Care Planning/Counseling  Complete  Medication Review [employer])  Complete

## 2024-02-06 NOTE — Progress Notes (Signed)
 PROGRESS NOTE    Misty Arnold  ZOX:[id number] DOB: 1971-04-05 DOA: 12/30/2023 PCP: Serena Croissant, MD   Brief Narrative:  53 year old female with PMH significant for right sided metastatic breast cancer status post palliative chemo, radiation and mastectomy, chronic pain secondary to cancer, depression and anxiety who was admitted for uncontrolled pain. Now is a placement/safe d/c issue. Palliative care helping with dispositio   Assessment & Plan:   Cancer associated pain: She is currently on Dilaudid PCA. Plan is to transition her to home under hospice care with the PCA. TOC unable to find an agency to accept her in [place]. 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 Has extensive bony metastasis, mets to the liver and lungs. Continue Decadron.   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 Status post 1.4 L removal via thoracentesis on 2/26. Status post right Pleurx catheter placement by IR.  Continue as needed drainage   underweight -Follow nutrition recommendations  Anemia of chronic disease -From cancer.  Thrombocytosis -Possibly reactive   DVT prophylaxis: Lovenox Code Status: DNR Family Communication: None at bedside Disposition Plan: Status is: Inpatient Remains inpatient appropriate because: Pending safe disposition    Consultants: Oncology/palliative care/IR  Procedures: As above  Antimicrobials: None currently   Subjective: Patient seen and examined at bedside.  No shortness of breath, fever or vomiting reported.  Objective: Vitals:   02/05/24 2327 02/06/24 0511 02/06/24 0607 02/06/24 0752  BP:   (!) 92/58   Pulse:   81   Resp: 18 16 18 18   Temp:   98.2 F (36.8 C)   TempSrc:   Oral   SpO2:   99% 99%  Weight:      Height:        Intake/Output Summary (Last 24 hours) at 02/06/2024  0816 Last data filed at 02/05/2024 1451 Gross per 24 hour  Intake 240 ml  Output 650 ml  Net -410 ml   Filed Weights   12/30/23 1220  Weight: 54.4 kg    Examination:  General: On room air.  No distress ENT/neck: No thyromegaly.  JVD is not elevated  respiratory: Decreased breath sounds at bases bilaterally with some crackles; no wheezing  CVS: S1-S2 heard, rate controlled currently Abdominal: Soft, nontender, slightly distended; no organomegaly,  bowel sounds are heard Extremities: Trace lower extremity edema; no cyanosis  CNS: Awake and alert.  No focal neurologic deficit.  Moves extremities Lymph: No obvious lymphadenopathy Skin: No obvious petechiae/rashes psych: Not agitated currently.  Affect is flat. musculoskeletal: No obvious joint swelling/deformity    Data Reviewed: I have personally reviewed following labs and imaging studies  CBC: Recent Labs  Lab 02/02/24 0635  WBC 9.6  HGB 9.4*  HCT 31.4*  MCV 90.2  PLT 508*   Basic Metabolic Panel: Recent Labs  Lab 02/02/24 0635  NA 137  K 4.2  CL 102  CO2 29  GLUCOSE 74  BUN 11  CREATININE 0.89  CALCIUM 9.6  MG 2.7*  PHOS 2.2*   GFR: Estimated Creatinine Clearance: 62.1 mL/min (by C-G formula based on SCr of 0.89 mg/dL). Liver Function Tests: No results for input(s): "AST", "ALT", "ALKPHOS", "BILITOT", "PROT", "ALBUMIN" in the last 168 hours. No results for input(s): "LIPASE", "AMYLASE" in the last 168 hours. No results for input(s): "AMMONIA" in the last 168 hours. Coagulation Profile: No results for input(s): "INR", "PROTIME" in the  last 168 hours. Cardiac Enzymes: No results for input(s): "CKTOTAL", "CKMB", "CKMBINDEX", "TROPONINI" in the last 168 hours. BNP (last 3 results) No results for input(s): "PROBNP" in the last 8760 hours. HbA1C: No results for input(s): "HGBA1C" in the last 72 hours. CBG: No results for input(s): "GLUCAP" in the last 168 hours. Lipid Profile: No results for input(s):  "CHOL", "HDL", "LDLCALC", "TRIG", "CHOLHDL", "LDLDIRECT" in the last 72 hours. Thyroid Function Tests: No results for input(s): "TSH", "T4TOTAL", "FREET4", "T3FREE", "THYROIDAB" in the last 72 hours. Anemia Panel: No results for input(s): "VITAMINB12", "FOLATE", "FERRITIN", "TIBC", "IRON", "RETICCTPCT" in the last 72 hours. Sepsis Labs: No results for input(s): "PROCALCITON", "LATICACIDVEN" in the last 168 hours.  No results found for this or any previous visit (from the past 240 hours).       Radiology Studies: No results found.      Scheduled Meds:  ammonium lactate   Topical BID   atenolol  50 mg Oral BID   celecoxib  200 mg Oral QHS   Chlorhexidine Gluconate Cloth  6 each Topical Daily   dexamethasone  4 mg Oral Daily   enoxaparin (LOVENOX) injection  40 mg Subcutaneous Q24H   feeding supplement  1 Container Oral TID BM   fluconazole  100 mg Oral Daily   HYDROmorphone   Intravenous Q4H   lidocaine-EPINEPHrine  20 mL Intradermal Once   magic mouthwash  5 mL Oral TID   mirtazapine  15 mg Oral QHS   multivitamins with iron  1 tablet Oral Daily   oxyCODONE  40 mg Oral Q8H   polyethylene glycol  17 g Oral BID   senna-docusate  1 tablet Oral BID   sodium chloride flush  10-40 mL Intracatheter Q12H   Vitamin D (Ergocalciferol)  50,000 Units Oral Q7 days   Continuous Infusions:        Glade Lloyd, MD [facility] 02/06/2024, 8:16 AM

## 2024-02-06 NOTE — Plan of Care (Signed)

## 2024-02-07 DIAGNOSIS — G893 Neoplasm related pain (acute) (chronic): Secondary | ICD-10-CM | POA: Diagnosis not present

## 2024-02-07 NOTE — Plan of Care (Signed)

## 2024-02-07 NOTE — Progress Notes (Signed)
 PROGRESS NOTE    Misty Arnold  VWU:[id number] DOB: [birth year] DOA: 12/30/2023 PCP: Serena Croissant, MD   Brief Narrative:  53 year old female with PMH significant for right sided metastatic breast cancer status post palliative chemo, radiation and mastectomy, chronic pain secondary to cancer, depression and anxiety who was admitted for uncontrolled pain. Now is a placement/safe d/c issue. Palliative care helping with dispositio   Assessment & Plan:   Cancer associated pain: She is currently on Dilaudid PCA. Plan is to transition her to home under hospice care with the PCA. TOC unable to find an agency to accept her in [place]. Palliative care following   Metastasis from breast cancer [facility]) She has fungating mass across her right chest wall encroaching into her left chest wall Continue twice daily dressing changes. Has extensive bony metastasis, mets to the liver and lungs. Continue Decadron.   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 Status post 1.4 L removal via thoracentesis on 2/26. Status post right Pleurx catheter placement by IR.  Continue as needed drainage   underweight -Follow nutrition recommendations  Anemia of chronic disease -From cancer.  Thrombocytosis -Possibly reactive   DVT prophylaxis: Lovenox Code Status: DNR Family Communication: None at bedside Disposition Plan: Status is: Inpatient Remains inpatient appropriate because: Pending safe disposition    Consultants: Oncology/palliative care/IR  Procedures: As above  Antimicrobials: None currently   Subjective: Patient seen and examined at bedside.  Denies any fever, worsening shortness of breath or vomiting. Objective: Vitals:   02/06/24 2157 02/07/24 0042 02/07/24 0430 02/07/24 0654  BP: 110/71   (!) 106/59  Pulse: 97   82  Resp:  16 16   Temp:    97.7 F (36.5 C)  TempSrc:    Oral  SpO2:  98% 98% 98%  Weight:      Height:        Intake/Output Summary (Last 24 hours) at  02/07/2024 0731 Last data filed at 02/07/2024 0044 Gross per 24 hour  Intake 370 ml  Output --  Net 370 ml   Filed Weights   12/30/23 1220  Weight: 54.4 kg    Examination:  General: Currently on room air.  No distress.  respiratory: Bilateral decreased breath sounds at bases with some scattered crackles CVS: Currently rate controlled; S1-S2 heard  abdominal: Soft, nontender, distended mildly, no organomegaly; normal bowel sounds are heard  extremities: Mild lower extremity edema; no clubbing.       Data Reviewed: I have personally reviewed following labs and imaging studies  CBC: Recent Labs  Lab 02/02/24 0635  WBC 9.6  HGB 9.4*  HCT 31.4*  MCV 90.2  PLT 508*   Basic Metabolic Panel: Recent Labs  Lab 02/02/24 0635  NA 137  K 4.2  CL 102  CO2 29  GLUCOSE 74  BUN 11  CREATININE 0.89  CALCIUM 9.6  MG 2.7*  PHOS 2.2*   GFR: Estimated Creatinine Clearance: 62.1 mL/min (by C-G formula based on SCr of 0.89 mg/dL). Liver Function Tests: No results for input(s): "AST", "ALT", "ALKPHOS", "BILITOT", "PROT", "ALBUMIN" in the last 168 hours. No results for input(s): "LIPASE", "AMYLASE" in the last 168 hours. No results for input(s): "AMMONIA" in the last 168 hours. Coagulation Profile: No results for input(s): "INR", "PROTIME" in the last 168 hours. Cardiac Enzymes: No results for input(s): "CKTOTAL", "CKMB", "CKMBINDEX", "TROPONINI" in the last 168 hours. BNP (last 3 results) No results for input(s): "PROBNP" in the last 8760 hours. HbA1C: No results for input(s): "HGBA1C" in  the last 72 hours. CBG: No results for input(s): "GLUCAP" in the last 168 hours. Lipid Profile: No results for input(s): "CHOL", "HDL", "LDLCALC", "TRIG", "CHOLHDL", "LDLDIRECT" in the last 72 hours. Thyroid Function Tests: No results for input(s): "TSH", "T4TOTAL", "FREET4", "T3FREE", "THYROIDAB" in the last 72 hours. Anemia Panel: No results for input(s): "VITAMINB12", "FOLATE",  "FERRITIN", "TIBC", "IRON", "RETICCTPCT" in the last 72 hours. Sepsis Labs: No results for input(s): "PROCALCITON", "LATICACIDVEN" in the last 168 hours.  No results found for this or any previous visit (from the past 240 hours).       Radiology Studies: No results found.      Scheduled Meds:  ammonium lactate   Topical BID   atenolol  50 mg Oral BID   celecoxib  200 mg Oral QHS   Chlorhexidine Gluconate Cloth  6 each Topical Daily   dexamethasone  4 mg Oral Daily   enoxaparin (LOVENOX) injection  40 mg Subcutaneous Q24H   feeding supplement  1 Container Oral TID BM   fluconazole  100 mg Oral Daily   HYDROmorphone   Intravenous Q4H   lidocaine-EPINEPHrine  20 mL Intradermal Once   magic mouthwash  5 mL Oral TID   mirtazapine  15 mg Oral QHS   multivitamins with iron  1 tablet Oral Daily   oxyCODONE  40 mg Oral Q8H   polyethylene glycol  17 g Oral BID   senna-docusate  1 tablet Oral BID   sodium chloride flush  10-40 mL Intracatheter Q12H   Vitamin D (Ergocalciferol)  50,000 Units Oral Q7 days   Continuous Infusions:        Glade Lloyd, MD [facility] 02/07/2024, 7:31 AM

## 2024-02-07 NOTE — Plan of Care (Signed)
  Problem: Health Behavior/Discharge Planning: Goal: Ability to manage health-related needs will improve Outcome: Progressing   Problem: Clinical Measurements: Goal: Ability to maintain clinical measurements within normal limits will improve Outcome: Progressing Goal: Will remain free from infection Outcome: Progressing Goal: Diagnostic test results will improve Outcome: Progressing Goal: Respiratory complications will improve Outcome: Progressing Goal: Cardiovascular complication will be avoided Outcome: Progressing   Problem: Activity: Goal: Risk for activity intolerance will decrease Outcome: Progressing   Problem: Nutrition: Goal: Adequate nutrition will be maintained Outcome: Progressing   Problem: Coping: Goal: Level of anxiety will decrease Outcome: Progressing   Problem: Elimination: Goal: Will not experience complications related to bowel motility Outcome: Progressing Goal: Will not experience complications related to urinary retention Outcome: Progressing   Problem: Pain Managment: Goal: General experience of comfort will improve and/or be controlled Outcome: Progressing   Problem: Safety: Goal: Ability to remain free from injury will improve Outcome: Progressing

## 2024-02-08 DIAGNOSIS — G893 Neoplasm related pain (acute) (chronic): Secondary | ICD-10-CM | POA: Diagnosis not present

## 2024-02-08 NOTE — Plan of Care (Signed)
  Problem: Health Behavior/Discharge Planning: Goal: Ability to manage health-related needs will improve 02/08/2024 1649 by Annell Greening, RN Outcome: Progressing 02/08/2024 1648 by Annell Greening, RN Outcome: Progressing   Problem: Clinical Measurements: Goal: Ability to maintain clinical measurements within normal limits will improve 02/08/2024 1649 by Annell Greening, RN Outcome: Progressing 02/08/2024 1648 by Annell Greening, RN Outcome: Progressing Goal: Will remain free from infection 02/08/2024 1649 by Annell Greening, RN Outcome: Progressing 02/08/2024 1648 by Annell Greening, RN Outcome: Progressing Goal: Diagnostic test results will improve 02/08/2024 1649 by Annell Greening, RN Outcome: Progressing 02/08/2024 1648 by Annell Greening, RN Outcome: Progressing Goal: Respiratory complications will improve 02/08/2024 1649 by Annell Greening, RN Outcome: Progressing 02/08/2024 1648 by Annell Greening, RN Outcome: Progressing Goal: Cardiovascular complication will be avoided 02/08/2024 1649 by Annell Greening, RN Outcome: Progressing 02/08/2024 1648 by Annell Greening, RN Outcome: Progressing   Problem: Activity: Goal: Risk for activity intolerance will decrease 02/08/2024 1649 by Annell Greening, RN Outcome: Progressing 02/08/2024 1648 by Annell Greening, RN Outcome: Progressing   Problem: Nutrition: Goal: Adequate nutrition will be maintained 02/08/2024 1649 by Annell Greening, RN Outcome: Progressing 02/08/2024 1648 by Annell Greening, RN Outcome: Progressing   Problem: Coping: Goal: Level of anxiety will decrease 02/08/2024 1649 by Annell Greening, RN Outcome: Not Progressing 02/08/2024 1648 by Annell Greening, RN Outcome: Progressing   Problem: Elimination: Goal: Will not experience complications related to  bowel motility 02/08/2024 1649 by Annell Greening, RN Outcome: Progressing 02/08/2024 1648 by Annell Greening, RN Outcome: Progressing Goal: Will not experience complications related to urinary retention 02/08/2024 1649 by Annell Greening, RN Outcome: Progressing 02/08/2024 1648 by Annell Greening, RN Outcome: Progressing   Problem: Pain Managment: Goal: General experience of comfort will improve and/or be controlled 02/08/2024 1649 by Annell Greening, RN Outcome: Not Progressing 02/08/2024 1648 by Annell Greening, RN Outcome: Progressing   Problem: Safety: Goal: Ability to remain free from injury will improve 02/08/2024 1649 by Annell Greening, RN Outcome: Progressing 02/08/2024 1648 by Annell Greening, RN Outcome: Progressing   Problem: Skin Integrity: Goal: Risk for impaired skin integrity will decrease 02/08/2024 1649 by Annell Greening, RN Outcome: Not Progressing 02/08/2024 1648 by Annell Greening, RN Outcome: Progressing

## 2024-02-08 NOTE — Progress Notes (Signed)
 PROGRESS NOTE    Misty Arnold  ZOX:[id number] DOB: [birth year] DOA: 12/30/2023 PCP: Serena Croissant, MD   Brief Narrative:  53 year old female with PMH significant for right sided metastatic breast cancer status post palliative chemo, radiation and mastectomy, chronic pain secondary to cancer, depression and anxiety who was admitted for uncontrolled pain. Now is a placement/safe d/c issue. Palliative care helping with dispositio   Assessment & Plan:   Cancer associated pain: She is currently on Dilaudid PCA. Plan is to transition her to home under hospice care with the PCA. TOC unable to find an agency to accept her in [place]. 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 Has extensive bony metastasis, mets to the liver and lungs. Continue Decadron.   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 Status post 1.4 L removal via thoracentesis on 2/26. Status post right Pleurx catheter placement by IR.  Continue as needed drainage   underweight -Follow nutrition recommendations  Anemia of chronic disease -From cancer.  Thrombocytosis -Possibly reactive   DVT prophylaxis: Lovenox Code Status: DNR Family Communication: None at bedside Disposition Plan: Status is: Inpatient Remains inpatient appropriate because: Pending safe disposition    Consultants: Oncology/palliative care/IR  Procedures: As above  Antimicrobials: None currently   Subjective: Patient seen and examined at bedside.  No chest pain, shortness of breath, fever reported. Objective: Vitals:   02/08/24 0012 02/08/24 0354 02/08/24 0506 02/08/24 0729  BP:   (!) 96/59   Pulse:   77   Resp: 20 16 14 14   Temp:   98.2 F (36.8 C)   TempSrc:   Oral   SpO2: 96% 98% 99% 99%  Weight:      Height:        Intake/Output Summary (Last 24 hours) at 02/08/2024  0828 Last data filed at 02/07/2024 1805 Gross per 24 hour  Intake 480 ml  Output 450 ml  Net 30 ml   Filed Weights   12/30/23 1220  Weight: 54.4 kg    Examination:  General: No acute distress.  Remains on room air.  Looks chronically ill and deconditioned.   Respiratory: Decreased breath sounds at bases bilaterally with some crackles CVS: S1 and S2 are heard; rate mostly controlled abdominal: Soft, nontender, slightly distended; no organomegaly; bowel sounds normally heard extremities: No cyanosis; trace lower extremity edema present    Data Reviewed: I have personally reviewed following labs and imaging studies  CBC: Recent Labs  Lab 02/02/24 0635  WBC 9.6  HGB 9.4*  HCT 31.4*  MCV 90.2  PLT 508*   Basic Metabolic Panel: Recent Labs  Lab 02/02/24 0635  NA 137  K 4.2  CL 102  CO2 29  GLUCOSE 74  BUN 11  CREATININE 0.89  CALCIUM 9.6  MG 2.7*  PHOS 2.2*   GFR: Estimated Creatinine Clearance: 62.1 mL/min (by C-G formula based on SCr of 0.89 mg/dL). Liver Function Tests: No results for input(s): "AST", "ALT", "ALKPHOS", "BILITOT", "PROT", "ALBUMIN" in the last 168 hours. No results for input(s): "LIPASE", "AMYLASE" in the last 168 hours. No results for input(s): "AMMONIA" in the last 168 hours. Coagulation Profile: No results for input(s): "INR", "PROTIME" in the last 168 hours. Cardiac Enzymes: No results for input(s): "CKTOTAL", "CKMB", "CKMBINDEX", "TROPONINI" in the last 168 hours. BNP (last 3 results) No results for input(s): "PROBNP" in the last 8760 hours. HbA1C: No  results for input(s): "HGBA1C" in the last 72 hours. CBG: No results for input(s): "GLUCAP" in the last 168 hours. Lipid Profile: No results for input(s): "CHOL", "HDL", "LDLCALC", "TRIG", "CHOLHDL", "LDLDIRECT" in the last 72 hours. Thyroid Function Tests: No results for input(s): "TSH", "T4TOTAL", "FREET4", "T3FREE", "THYROIDAB" in the last 72 hours. Anemia Panel: No results for  input(s): "VITAMINB12", "FOLATE", "FERRITIN", "TIBC", "IRON", "RETICCTPCT" in the last 72 hours. Sepsis Labs: No results for input(s): "PROCALCITON", "LATICACIDVEN" in the last 168 hours.  No results found for this or any previous visit (from the past 240 hours).       Radiology Studies: No results found.      Scheduled Meds:  ammonium lactate   Topical BID   atenolol  50 mg Oral BID   celecoxib  200 mg Oral QHS   Chlorhexidine Gluconate Cloth  6 each Topical Daily   dexamethasone  4 mg Oral Daily   enoxaparin (LOVENOX) injection  40 mg Subcutaneous Q24H   feeding supplement  1 Container Oral TID BM   fluconazole  100 mg Oral Daily   HYDROmorphone   Intravenous Q4H   lidocaine-EPINEPHrine  20 mL Intradermal Once   magic mouthwash  5 mL Oral TID   mirtazapine  15 mg Oral QHS   multivitamins with iron  1 tablet Oral Daily   oxyCODONE  40 mg Oral Q8H   polyethylene glycol  17 g Oral BID   senna-docusate  1 tablet Oral BID   sodium chloride flush  10-40 mL Intracatheter Q12H   Vitamin D (Ergocalciferol)  50,000 Units Oral Q7 days   Continuous Infusions:        Glade Lloyd, MD [facility] 02/08/2024, 8:28 AM

## 2024-02-09 DIAGNOSIS — G893 Neoplasm related pain (acute) (chronic): Secondary | ICD-10-CM | POA: Diagnosis not present

## 2024-02-09 NOTE — Progress Notes (Signed)
 Per her request, patient completed her own wound care to her chest herself. Declined any assistance from RN.

## 2024-02-09 NOTE — Progress Notes (Signed)
 PROGRESS NOTE    Misty Arnold  UJW:[id number] DOB: 10/12/1971 DOA: 12/30/2023 PCP: Serena Croissant, MD   Brief Narrative:  54 year old female with PMH significant for right sided metastatic breast cancer status post palliative chemo, radiation and mastectomy, chronic pain secondary to cancer, depression and anxiety who was admitted for uncontrolled pain. Now is a placement/safe d/c issue. Palliative care helping with dispositio   Assessment & Plan:   Cancer associated pain: She is currently on Dilaudid PCA. Plan is to transition her to home under hospice care with the PCA. TOC unable to find an agency to accept her in [place]. Palliative care following   Metastasis from breast cancer [facility]) She has fungating mass across her right chest wall encroaching into her left chest wall Continue twice daily dressing changes. Has extensive bony metastasis, mets to the liver and lungs. Continue Decadron.   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 Status post 1.4 L removal via thoracentesis on 2/26. Status post right Pleurx catheter placement by IR.  Continue as needed drainage   underweight -Follow nutrition recommendations  Anemia of chronic disease -From cancer.  Thrombocytosis -Possibly reactive   DVT prophylaxis: Lovenox Code Status: DNR Family Communication: None at bedside Disposition Plan: Status is: Inpatient Remains inpatient appropriate because: Pending safe disposition    Consultants: Oncology/palliative care/IR  Procedures: As above  Antimicrobials: None currently   Subjective: Patient seen and examined at bedside.  No fever, vomiting, chest pain reported.   Objective: Vitals:   02/09/24 0032 02/09/24 0414 02/09/24 0417 02/09/24 0745  BP:   (!) 94/59   Pulse:   84   Resp: 18 18 14 15   Temp:   (!) 97.2 F (36.2 C)   TempSrc:   Oral   SpO2: 95% 95% 99%   Weight:      Height:        Intake/Output Summary (Last 24 hours) at 02/09/2024  0754 Last data filed at 02/08/2024 1400 Gross per 24 hour  Intake 0 ml  Output 350 ml  Net -350 ml   Filed Weights   12/30/23 1220  Weight: 54.4 kg    Examination:  General: On room air.  No distress.  Looks chronically ill and deconditioned.   Respiratory: Bilateral decreased breath sounds at bases with scattered crackles CVS: Rate controlled; S1 and S2 heard  abdominal: Soft, nontender, distended slightly; no organomegaly; normal bowel sounds are heard extremities: Mild lower extremity edema present; no clubbing   Data Reviewed: I have personally reviewed following labs and imaging studies  CBC: No results for input(s): "WBC", "NEUTROABS", "HGB", "HCT", "MCV", "PLT" in the last 168 hours.  Basic Metabolic Panel: No results for input(s): "NA", "K", "CL", "CO2", "GLUCOSE", "BUN", "CREATININE", "CALCIUM", "MG", "PHOS" in the last 168 hours.  GFR: Estimated Creatinine Clearance: 62.1 mL/min (by C-G formula based on SCr of 0.89 mg/dL). Liver Function Tests: No results for input(s): "AST", "ALT", "ALKPHOS", "BILITOT", "PROT", "ALBUMIN" in the last 168 hours. No results for input(s): "LIPASE", "AMYLASE" in the last 168 hours. No results for input(s): "AMMONIA" in the last 168 hours. Coagulation Profile: No results for input(s): "INR", "PROTIME" in the last 168 hours. Cardiac Enzymes: No results for input(s): "CKTOTAL", "CKMB", "CKMBINDEX", "TROPONINI" in the last 168 hours. BNP (last 3 results) No results for input(s): "PROBNP" in the last 8760 hours. HbA1C: No results for input(s): "HGBA1C" in the last 72 hours. CBG: No results for input(s): "GLUCAP" in the last 168 hours. Lipid Profile: No results for input(s): "CHOL", "  HDL", "LDLCALC", "TRIG", "CHOLHDL", "LDLDIRECT" in the last 72 hours. Thyroid Function Tests: No results for input(s): "TSH", "T4TOTAL", "FREET4", "T3FREE", "THYROIDAB" in the last 72 hours. Anemia Panel: No results for input(s): "VITAMINB12", "FOLATE",  "FERRITIN", "TIBC", "IRON", "RETICCTPCT" in the last 72 hours. Sepsis Labs: No results for input(s): "PROCALCITON", "LATICACIDVEN" in the last 168 hours.  No results found for this or any previous visit (from the past 240 hours).       Radiology Studies: No results found.      Scheduled Meds:  ammonium lactate   Topical BID   atenolol  50 mg Oral BID   celecoxib  200 mg Oral QHS   Chlorhexidine Gluconate Cloth  6 each Topical Daily   dexamethasone  4 mg Oral Daily   enoxaparin (LOVENOX) injection  40 mg Subcutaneous Q24H   feeding supplement  1 Container Oral TID BM   HYDROmorphone   Intravenous Q4H   lidocaine-EPINEPHrine  20 mL Intradermal Once   magic mouthwash  5 mL Oral TID   mirtazapine  15 mg Oral QHS   multivitamins with iron  1 tablet Oral Daily   oxyCODONE  40 mg Oral Q8H   polyethylene glycol  17 g Oral BID   senna-docusate  1 tablet Oral BID   sodium chloride flush  10-40 mL Intracatheter Q12H   Vitamin D (Ergocalciferol)  50,000 Units Oral Q7 days   Continuous Infusions:        Glade Lloyd, MD [facility] 02/09/2024, 7:54 AM

## 2024-02-09 NOTE — Plan of Care (Signed)
  Problem: Health Behavior/Discharge Planning: Goal: Ability to manage health-related needs will improve Outcome: Progressing   Problem: Clinical Measurements: Goal: Ability to maintain clinical measurements within normal limits will improve Outcome: Progressing Goal: Diagnostic test results will improve Outcome: Progressing   Problem: Activity: Goal: Risk for activity intolerance will decrease Outcome: Progressing   Problem: Nutrition: Goal: Adequate nutrition will be maintained Outcome: Progressing   Problem: Pain Managment: Goal: General experience of comfort will improve and/or be controlled Outcome: Progressing

## 2024-02-09 NOTE — Plan of Care (Signed)

## 2024-02-10 ENCOUNTER — Ambulatory Visit: Payer: Medicare PPO | Admitting: Hematology and Oncology

## 2024-02-10 ENCOUNTER — Other Ambulatory Visit: Payer: Medicare PPO

## 2024-02-10 ENCOUNTER — Ambulatory Visit: Payer: Medicare PPO

## 2024-02-10 DIAGNOSIS — G893 Neoplasm related pain (acute) (chronic): Secondary | ICD-10-CM | POA: Diagnosis not present

## 2024-02-10 NOTE — Progress Notes (Signed)
 Nutrition Follow-up  DOCUMENTATION CODES:   Non-severe (moderate) malnutrition in context of chronic illness (metastatic breast cancer)  INTERVENTION:   -Needs updated weight for admission  -Encourage PO as tolerated  -Boost Breeze po TID, each supplement provides 250 kcal and 9 grams of protein  -Multivitamin with minerals daily   -Snacks TID in between meals to help meet calorie and protein needs   -RD to sign off at this time  NUTRITION DIAGNOSIS:   Moderate Malnutrition related to chronic illness (cancer and cancer related treatments) as evidenced by energy intake < 75% for > or equal to 1 month, moderate muscle depletion, moderate fat depletion, percent weight loss.  Ongoing.  GOAL:   Patient will meet greater than or equal to 90% of their needs  Progressing.  MONITOR:   PO intake, Supplement acceptance, Weight trends  ASSESSMENT:   Pt with hx pf right breast metastatic cancer (s/p mastectomy, palliative radiation/chemotherapy, on Keytruda), spinal metastases, chest wall mass, chronic pain, depression/anxiety. Pt admitted for worsening back and breast pain and recommendation of oncologist for hospital admission for pain management.  Patient to discharge home with hospice. Continues to accept some Boost Breeze, last PO documented as 100%.   Admission weight: 120 lbs Needs updated weight for admission, no weight since 2/24.  Medications: Magic Mouthwash, Remeron, Multivitamin with minerals daily, Miralax, Senokot, Vitamin D weekly  Labs reviewed: Low Phos Elevated Mg  Diet Order:   Diet Order             Diet regular Room service appropriate? Yes; Fluid consistency: Thin  Diet effective now                   EDUCATION NEEDS:   Education needs have been addressed  Skin:  Skin Assessment: Reviewed RN Assessment (R chest wound and tumor)  Last BM:  4/6  Height:   Ht Readings from Last 1 Encounters:  12/30/23 5\' 5"  (1.651 m)    Weight:    Wt Readings from Last 1 Encounters:  12/30/23 54.4 kg    BMI:  Body mass index is 19.97 kg/m.  Estimated Nutritional Needs:   Kcal:  1600-1800  Protein:  80-95g  Fluid:  >/= 2L   Tilda Franco, MS, RD, LDN Inpatient Clinical Dietitian Contact via Secure chat

## 2024-02-10 NOTE — Progress Notes (Signed)
 PROGRESS NOTE    Misty Arnold  ZOX:[id number] DOB: [birth year] DOA: 12/30/2023 PCP: Serena Croissant, MD   Brief Narrative:  53 year old female with PMH significant for right sided metastatic breast cancer status post palliative chemo, radiation and mastectomy, chronic pain secondary to cancer, depression and anxiety who was admitted for uncontrolled pain. Now is a placement/safe d/c issue. Palliative care helping with dispositio   Assessment & Plan:   Cancer associated pain: She is currently on Dilaudid PCA. Plan is to transition her to home under hospice care with the PCA. TOC unable to find an agency to accept her in [place]. 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 Has extensive bony metastasis, mets to the liver and lungs. Continue Decadron.   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 Status post 1.4 L removal via thoracentesis on 2/26. Status post right Pleurx catheter placement by IR.  Continue as needed drainage   underweight -Follow nutrition recommendations  Anemia of chronic disease -From cancer.  Thrombocytosis -Possibly reactive   DVT prophylaxis: Lovenox Code Status: DNR Family Communication: None at bedside Disposition Plan: Status is: Inpatient Remains inpatient appropriate because: Pending safe disposition    Consultants: Oncology/palliative care/IR  Procedures: As above  Antimicrobials: None currently   Subjective: Patient seen and examined at bedside.  Denies worsening shortness breath, abdominal pain or vomiting.   Objective: Vitals:   02/09/24 2203 02/09/24 2333 02/10/24 0409 02/10/24 0447  BP: 101/61   96/61  Pulse: 95   81  Resp: 16 16 15 14   Temp: 97.8 F (36.6 C)   (!) 97.4 F (36.3 C)  TempSrc: Oral   Oral  SpO2: 100% 100% 99% 98%  Weight:      Height:         Intake/Output Summary (Last 24 hours) at 02/10/2024 0737 Last data filed at 02/09/2024 2000 Gross per 24 hour  Intake 240 ml  Output 350 ml  Net -110 ml   Filed Weights   12/30/23 1220  Weight: 54.4 kg    Examination:  General: No acute distress.  Remains on room air.  Looks chronically ill and deconditioned.   Respiratory: Decreased breath sounds at bases bilaterally, no wheezing  CVS: S1 and S2 are heard; rate currently controlled abdominal: Soft, nontender, mildly distended; no organomegaly; bowel sounds heard extremities: No cyanosis; trace lower extremity edema present  Data Reviewed: I have personally reviewed following labs and imaging studies  CBC: No results for input(s): "WBC", "NEUTROABS", "HGB", "HCT", "MCV", "PLT" in the last 168 hours.  Basic Metabolic Panel: No results for input(s): "NA", "K", "CL", "CO2", "GLUCOSE", "BUN", "CREATININE", "CALCIUM", "MG", "PHOS" in the last 168 hours.  GFR: Estimated Creatinine Clearance: 62.1 mL/min (by C-G formula based on SCr of 0.89 mg/dL). Liver Function Tests: No results for input(s): "AST", "ALT", "ALKPHOS", "BILITOT", "PROT", "ALBUMIN" in the last 168 hours. No results for input(s): "LIPASE", "AMYLASE" in the last 168 hours. No results for input(s): "AMMONIA" in the last 168 hours. Coagulation Profile: No results for input(s): "INR", "PROTIME" in the last 168 hours. Cardiac Enzymes: No results for input(s): "CKTOTAL", "CKMB", "CKMBINDEX", "TROPONINI" in the last 168 hours. BNP (last 3 results) No results for input(s): "PROBNP" in the last 8760 hours. HbA1C: No results for input(s): "HGBA1C" in the last 72 hours. CBG: No results for input(s): "GLUCAP" in the last 168 hours. Lipid Profile: No  results for input(s): "CHOL", "HDL", "LDLCALC", "TRIG", "CHOLHDL", "LDLDIRECT" in the last 72 hours. Thyroid Function Tests: No results for input(s): "TSH", "T4TOTAL", "FREET4", "T3FREE", "THYROIDAB" in the last 72  hours. Anemia Panel: No results for input(s): "VITAMINB12", "FOLATE", "FERRITIN", "TIBC", "IRON", "RETICCTPCT" in the last 72 hours. Sepsis Labs: No results for input(s): "PROCALCITON", "LATICACIDVEN" in the last 168 hours.  No results found for this or any previous visit (from the past 240 hours).       Radiology Studies: No results found.      Scheduled Meds:  ammonium lactate   Topical BID   atenolol  50 mg Oral BID   celecoxib  200 mg Oral QHS   Chlorhexidine Gluconate Cloth  6 each Topical Daily   dexamethasone  4 mg Oral Daily   enoxaparin (LOVENOX) injection  40 mg Subcutaneous Q24H   feeding supplement  1 Container Oral TID BM   HYDROmorphone   Intravenous Q4H   lidocaine-EPINEPHrine  20 mL Intradermal Once   magic mouthwash  5 mL Oral TID   mirtazapine  15 mg Oral QHS   multivitamins with iron  1 tablet Oral Daily   oxyCODONE  40 mg Oral Q8H   polyethylene glycol  17 g Oral BID   senna-docusate  1 tablet Oral BID   sodium chloride flush  10-40 mL Intracatheter Q12H   Vitamin D (Ergocalciferol)  50,000 Units Oral Q7 days   Continuous Infusions:        Glade Lloyd, MD [facility] 02/10/2024, 7:37 AM

## 2024-02-11 DIAGNOSIS — G893 Neoplasm related pain (acute) (chronic): Secondary | ICD-10-CM | POA: Diagnosis not present

## 2024-02-11 MED ORDER — GELATIN ABSORBABLE 12-7 MM EX MISC
1.0000 | Freq: Every day | CUTANEOUS | Status: DC | PRN
  Administered 2024-02-11: 1 via TOPICAL
  Filled 2024-02-11 (×4): qty 1

## 2024-02-11 NOTE — Progress Notes (Signed)
       Overnight   NAME: Misty Arnold MRN: [id number] DOB : [birth year]    Date of Service   02/11/2024   HPI/Events of Note    Notified by RN for fall in room.  Bedside visit Patient explains that she was getting up from the bed to the bathroom. She explains that while using bed as she moves towards bathroom she started to lean and while unable to keep herself up had to slide down onto her buttocks. She denies any injury No injuries found No deformities, contusions, abrasions, punctures, bruising, tears, lacerations, swelling.  Patient is awake and oriented x 4 Patient states she did not hit her head  Patient has no obvious or stated distress.  She explains that she has no additional, or new, pain or complaints  Interventions/ Plan   Fall precautions. Monitor mentation per unit protocol Continue all previous attending orders.      Chinita Greenland BSN MSNA MSN ACNPC-AG Acute Care Nurse Practitioner [facility]

## 2024-02-11 NOTE — Plan of Care (Signed)
  Problem: Activity: Goal: Risk for activity intolerance will decrease Outcome: Progressing   Problem: Nutrition: Goal: Adequate nutrition will be maintained Outcome: Progressing   Problem: Elimination: Goal: Will not experience complications related to bowel motility Outcome: Progressing Goal: Will not experience complications related to urinary retention Outcome: Progressing   Problem: Safety: Goal: Ability to remain free from injury will improve Outcome: Progressing   Problem: Skin Integrity: Goal: Risk for impaired skin integrity will decrease Outcome: Progressing   Problem: Pain Managment: Goal: General experience of comfort will improve and/or be controlled Outcome: Not Progressing Note: Pain remains stable but unchanged

## 2024-02-11 NOTE — Progress Notes (Signed)
 VAST consulted to assess PICC after fall. Pt's PICC dressing remains intact with line in place in left arm. Changed needleless cap; good blood return obtained and easy to flush. Unit RN notified

## 2024-02-11 NOTE — Progress Notes (Signed)
 PROGRESS NOTE    Misty Arnold  WUJ:[id number] DOB: 1971/09/03 DOA: 12/30/2023 PCP: Serena Croissant, MD   Brief Narrative:  53 year old female with PMH significant for right sided metastatic breast cancer status post palliative chemo, radiation and mastectomy, chronic pain secondary to cancer, depression and anxiety who was admitted for uncontrolled pain. Now is a placement/safe d/c issue. Palliative care helping with dispositio   Assessment & Plan:   Cancer associated pain: She is currently on Dilaudid PCA. Plan is to transition her to home under hospice care with the PCA. TOC unable to find an agency to accept [place] in [place]. 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 Has extensive bony metastasis, mets to the liver and lungs. Continue Decadron.   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 Status post 1.4 L removal via thoracentesis on 2/26. Status post right Pleurx catheter placement by IR.  Continue as needed drainage   underweight/moderate malnutrition -Follow nutrition recommendations  Anemia of chronic disease -From cancer.  Thrombocytosis -Possibly reactive   DVT prophylaxis: Lovenox Code Status: DNR Family Communication: None at bedside Disposition Plan: Status is: Inpatient Remains inpatient appropriate because: Pending safe disposition    Consultants: Oncology/palliative care/IR  Procedures: As above  Antimicrobials: None currently   Subjective: Patient seen and examined at bedside.  No fever, vomiting, worsening shortness of breath reported.   Objective: Vitals:   02/11/24 0354 02/11/24 0448 02/11/24 0649 02/11/24 0736  BP:  96/61 118/67   Pulse:  84 98   Resp: 16 18  18   Temp:  97.8 F (36.6 C) 97.9 F (36.6 C)   TempSrc:  Oral Oral   SpO2: 98% 100% 100%   Weight:      Height:         Intake/Output Summary (Last 24 hours) at 02/11/2024 0738 Last data filed at 02/10/2024 1533 Gross per 24 hour  Intake 10 ml  Output 400 ml  Net -390 ml   Filed Weights   12/30/23 1220  Weight: 54.4 kg    Examination:  General: On room air and in no distress.  Looks chronically ill and deconditioned.   Respiratory: Bilateral decreased baseline bases, no wheezing CVS: Mostly rate controlled; S1 and S2 heard abdominal: Soft, nontender, remains slightly distended; no organomegaly; bowel sounds heard normally extremities: Mild lower extremity edema present; no clubbing  Data Reviewed: I have personally reviewed following labs and imaging studies  CBC: No results for input(s): "WBC", "NEUTROABS", "HGB", "HCT", "MCV", "PLT" in the last 168 hours.  Basic Metabolic Panel: No results for input(s): "NA", "K", "CL", "CO2", "GLUCOSE", "BUN", "CREATININE", "CALCIUM", "MG", "PHOS" in the last 168 hours.  GFR: Estimated Creatinine Clearance: 62.1 mL/min (by C-G formula based on SCr of 0.89 mg/dL). Liver Function Tests: No results for input(s): "AST", "ALT", "ALKPHOS", "BILITOT", "PROT", "ALBUMIN" in the last 168 hours. No results for input(s): "LIPASE", "AMYLASE" in the last 168 hours. No results for input(s): "AMMONIA" in the last 168 hours. Coagulation Profile: No results for input(s): "INR", "PROTIME" in the last 168 hours. Cardiac Enzymes: No results for input(s): "CKTOTAL", "CKMB", "CKMBINDEX", "TROPONINI" in the last 168 hours. BNP (last 3 results) No results for input(s): "PROBNP" in the last 8760 hours. HbA1C: No results for input(s): "HGBA1C" in the last 72 hours. CBG: No results for input(s): "GLUCAP" in the last 168 hours. Lipid Profile: No results for input(s): "  CHOL", "HDL", "LDLCALC", "TRIG", "CHOLHDL", "LDLDIRECT" in the last 72 hours. Thyroid Function Tests: No results for input(s): "TSH", "T4TOTAL", "FREET4", "T3FREE", "THYROIDAB" in the last 72 hours. Anemia  Panel: No results for input(s): "VITAMINB12", "FOLATE", "FERRITIN", "TIBC", "IRON", "RETICCTPCT" in the last 72 hours. Sepsis Labs: No results for input(s): "PROCALCITON", "LATICACIDVEN" in the last 168 hours.  No results found for this or any previous visit (from the past 240 hours).       Radiology Studies: No results found.      Scheduled Meds:  ammonium lactate   Topical BID   atenolol  50 mg Oral BID   celecoxib  200 mg Oral QHS   Chlorhexidine Gluconate Cloth  6 each Topical Daily   dexamethasone  4 mg Oral Daily   enoxaparin (LOVENOX) injection  40 mg Subcutaneous Q24H   feeding supplement  1 Container Oral TID BM   HYDROmorphone   Intravenous Q4H   lidocaine-EPINEPHrine  20 mL Intradermal Once   magic mouthwash  5 mL Oral TID   mirtazapine  15 mg Oral QHS   multivitamins with iron  1 tablet Oral Daily   oxyCODONE  40 mg Oral Q8H   polyethylene glycol  17 g Oral BID   senna-docusate  1 tablet Oral BID   sodium chloride flush  10-40 mL Intracatheter Q12H   Vitamin D (Ergocalciferol)  50,000 Units Oral Q7 days   Continuous Infusions:        Glade Lloyd, MD [facility] 02/11/2024, 7:38 AM

## 2024-02-11 NOTE — Progress Notes (Signed)
 This nurse was notified by the Nurse Tech that the patient had fallen in her room. The patient states that she was getting up from the bed when she lost her balance and fell. She denies hitting her head or sustaining any new injuries. The patient had a Dilaudid PCA infusing prior to the fall, but the IV line had been pulled out afterward. This nurse placed a consult with the IV Team to verify that the PICC line placement was still intact. The evening attending physician was notified of the patient's fall and evaluated her at the bedside.

## 2024-02-12 DIAGNOSIS — G893 Neoplasm related pain (acute) (chronic): Secondary | ICD-10-CM | POA: Diagnosis not present

## 2024-02-12 MED ORDER — METRONIDAZOLE 0.75 % EX GEL
Freq: Two times a day (BID) | CUTANEOUS | Status: DC
  Administered 2024-02-13: 1 via TOPICAL
  Filled 2024-02-12 (×2): qty 45

## 2024-02-12 NOTE — Progress Notes (Signed)
 Palliative Care Progress Note  Misty Arnold remains stable on her current regimen for pain and symptom management. Hospitalization prolonged due to difficulties with safe discharge plan and complex home care needs ie: Pleurx and CADD PCA. We discussed alternative safe plan for her- she believes that she can go live with her brother in [place] which has full scope and high quality hospice care that should be able to manage her needs. I discussed with TOC who will begin working on this plan.I am happy to support after discharge as hospice attending if needed and can be of assistance to providers in [place]. Ordered Surgifoam prn to chest wound in the event of unexpected bleeding or uncontrolled oozing. We also discussed frequency of pleurx drainage-can probably drain every 2-3 days. Maintain PCA and PICC line at dc.  Need to update MOST form and DNR forms prior to discharge.  Will continue to follow for plan.  Anderson Malta, DO Palliative Medicine   Time: 35 min

## 2024-02-12 NOTE — Plan of Care (Addendum)
 Patient's pleurx drained at 1530 for a total of of yellow fluid. Skin intact around site, cleansed, split gauze, and tegaderm in place. Patient wound continues to saturate dressing despite wound care consult. All supplies given to patient since she performs her own care and all supplies & recommendations offered to her.  Problem: Activity: Goal: Risk for activity intolerance will decrease Outcome: Progressing   Problem: Elimination: Goal: Will not experience complications related to bowel motility Outcome: Progressing Goal: Will not experience complications related to urinary retention Outcome: Progressing   Problem: Pain Managment: Goal: General experience of comfort will improve and/or be controlled Outcome: Progressing   Problem: Safety: Goal: Ability to remain free from injury will improve Outcome: Progressing

## 2024-02-12 NOTE — Progress Notes (Signed)
 PROGRESS NOTE    Misty Arnold  ZOX:[id number] DOB: [birth year] DOA: 12/30/2023 PCP: Serena Croissant, MD   Brief Narrative:  This 53 year old female with PMH significant for right sided metastatic breast cancer status post palliative chemo, radiation and mastectomy, chronic pain secondary to cancer, depression and anxiety who was admitted for uncontrolled pain. Now is a placement /safe d/c issue. Palliative care helping with disposition.   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 Plan is to transition her to home under hospice care with the PCA. TOC unable to find an agency to accept [place] in [place]. 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 Has extensive bony metastasis, mets to the liver and lungs. Continue Decadron.   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 Status post 1.4 L removal via thoracentesis on 2/26. Status post right Pleurx catheter placement by IR.  Continue as needed drainage    underweight/moderate malnutrition: -Follow nutrition recommendations   Anemia of chronic disease: -From cancer.   Thrombocytosis: -Possibly reactive     DVT prophylaxis:Lovenox Code Status: DNR Family Communication: No family at bed side Disposition Plan:  Status is: Inpatient Remains inpatient appropriate because: Pending safe disposition.    Consultants:  Palliative care  Procedures: None  Antimicrobials: Anti-infectives (From admission, onward)    Start     Dose/Rate Route Frequency Ordered Stop   02/06/24 1000   fluconazole (DIFLUCAN) tablet 100 mg        100 mg Oral Daily 02/05/24 2230 02/08/24 0924   01/20/24 1500  vancomycin (VANCOCIN) IVPB 1000 mg/200 mL premix        1,000 mg 200 mL/hr over 60 Minutes Intravenous To Radiology 01/19/24 0824 01/22/24 1618      Subjective: Patient was seen and examined at bedside.  Overnight events noted.   Patient reports feeling better,  She reports pain is reasonably controlled.  TOC is working on her placement.  Objective: Vitals:   02/12/24 0520 02/12/24 0741 02/12/24 1201 02/12/24 1347  BP: 97/60   (!) 102/57  Pulse: 89   (!) 109  Resp: 18 18 20 20   Temp: 97.9 F (36.6 C)   97.7 F (36.5 C)  TempSrc: Oral   Oral  SpO2: 98%   97%  Weight:      Height:        Intake/Output Summary (Last 24 hours) at 02/12/2024 1347 Last data filed at 02/11/2024 1623 Gross per 24 hour  Intake 0 ml  Output 350 ml  Net -350 ml   Filed Weights   12/30/23 1220  Weight: 54.4 kg    Examination:  General exam: Appears calm and comfortable, not in any acute distress. Respiratory system: Clear to auscultation. Respiratory effort normal.  RR 16 Cardiovascular system: S1 & S2 heard, RRR. No JVD, murmurs, rubs, gallops or clicks. Gastrointestinal system: Abdomen is non distended, soft and non tender. Normal bowel sounds heard. Central nervous system: Alert and oriented x 3. No focal neurological deficits. Extremities: No edema, no cyanosis, no clubbing. Skin: No rashes, lesions or ulcers Psychiatry: Judgement and insight appear  normal. Mood & affect appropriate.     Data Reviewed: I have personally reviewed following labs and imaging studies  CBC: No results for input(s): "WBC", "NEUTROABS", "HGB", "HCT", "MCV", "PLT" in the last 168 hours. Basic Metabolic Panel: No results for input(s): "NA", "K", "CL", "CO2", "GLUCOSE", "BUN", "CREATININE", "CALCIUM", "MG", "PHOS" in the last 168 hours. GFR: Estimated Creatinine Clearance: 62.1 mL/min (by C-G formula based  on SCr of 0.89 mg/dL). Liver Function Tests: No results for input(s): "AST", "ALT", "ALKPHOS", "BILITOT", "PROT", "ALBUMIN" in the last 168 hours. No results for input(s): "LIPASE", "AMYLASE" in the last 168 hours. No results for input(s): "AMMONIA" in the last 168 hours. Coagulation Profile: No results for input(s): "INR", "PROTIME" in the last 168 hours. Cardiac Enzymes: No results for input(s): "CKTOTAL", "CKMB", "CKMBINDEX", "TROPONINI" in the last 168 hours. BNP (last 3 results) No results for input(s): "PROBNP" in the last 8760 hours. HbA1C: No results for input(s): "HGBA1C" in the last 72 hours. CBG: No results for input(s): "GLUCAP" in the last 168 hours. Lipid Profile: No results for input(s): "CHOL", "HDL", "LDLCALC", "TRIG", "CHOLHDL", "LDLDIRECT" in the last 72 hours. Thyroid Function Tests: No results for input(s): "TSH", "T4TOTAL", "FREET4", "T3FREE", "THYROIDAB" in the last 72 hours. Anemia Panel: No results for input(s): "VITAMINB12", "FOLATE", "FERRITIN", "TIBC", "IRON", "RETICCTPCT" in the last 72 hours. Sepsis Labs: No results for input(s): "PROCALCITON", "LATICACIDVEN" in the last 168 hours.  No results found for this or any previous visit (from the past 240 hours).  Radiology Studies: No results found.  Scheduled Meds:  ammonium lactate   Topical BID   atenolol  50 mg Oral BID   celecoxib  200 mg Oral QHS   Chlorhexidine Gluconate Cloth  6 each Topical Daily   dexamethasone  4 mg Oral Daily   enoxaparin (LOVENOX) injection  40 mg Subcutaneous Q24H   feeding supplement  1 Container Oral TID BM   HYDROmorphone   Intravenous Q4H   lidocaine-EPINEPHrine  20 mL Intradermal Once   magic mouthwash  5 mL Oral TID   metroNIDAZOLE   Topical BID   mirtazapine  15 mg Oral QHS   multivitamins with iron  1 tablet Oral Daily   oxyCODONE  40 mg Oral Q8H   polyethylene glycol  17 g Oral BID   senna-docusate  1 tablet Oral BID   sodium chloride flush  10-40 mL  Intracatheter Q12H   Vitamin D (Ergocalciferol)  50,000 Units Oral Q7 days   Continuous Infusions:   LOS: 44 days    Time spent: 35 mins    Willeen Niece, MD [facility]   If 7PM-7AM, please contact night-coverage

## 2024-02-12 NOTE — Consult Note (Signed)
 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 Patient performs complex dressing change 2x per day. She is using extensive amounts of dressings.  Wound type: cancerous  Pressure Injury POA: NA Measurement:25 x 40 Wound bed: fungating with one area of concern at the most central and deepest part of the tumor that will bleed excessively at times with dressing changes, which is very common with tumors of this nature.  The orders in the computer are not being done, the patient has a set way of doing her dressing. I did not suggest that common practice would be to use non traumatic /non adherent dressings because at this time excessive drainage is the biggest issue she is dealing with.  Drainage (amount, consistency, odor) serosanguinous, no active bleeding that the time of dressing change today Periwound: nodular lesions noted proximal and distally from current wound edges.  Dressing procedure/placement/frequency: Patient performs her own wound care Patient applies Metrogel to the entire wound bed first, I advised her to not apply to the area of recurrent bleeding today  She then applies silver hydrofiber directly to the wound bed, however today I have apply calcium alginate to the area of bleeding that the patient is concerned about because of its hemostatic properties.  Patient then tops the entire wound with Telfa pads (3) and then tops with ABD pads (5). Using breast binder from home to secure dressing.  Patient dressing completed with WOC nurse assist today. I have asked unit secretary to order (10) sheets of calcium alginate [place] # [phone]) as she will be using 2 per day.  Discussed with bedside nurse as well.   Discussed POC with patient and bedside nurse.  Re consult if needed, will not follow at this  time. Thanks  Carnella Fryman [employer], RN,CWOCN, CNS, CWON-AP [phone])

## 2024-02-12 NOTE — TOC Progression Note (Addendum)
 Transition of Care [facility]) - Progression Note    Patient Details  Name: Misty Arnold MRN: [id number] Date of Birth: 28-Aug-1971  [facility] CM/SW Contact  Beckie Busing, RN Phone Number:[phone]  02/12/2024, 2:30 PM  Clinical Narrative:    CM received call from [facility] supervisor Sharol Roussel to see if any of the Hospice agencies would be willing to accept this patient for symptom management. Suggested facilities are ([facility], [facility]) Message has been sent to [employer] for [employer]. Melissa will follow up with patient but reports that [facility] does not have a bed today. Melissa will review patient to determine if patient meets criteria.   1538 CM at bedside to discuss disposition with patient. Patient now states that she will be moving to [place] [facility] with her brother. So the plan will now be to find hospice agency in the [facility] area to provide [employer]. Patient states that she is not able to manage her pleurx drain at all and that pleurx has been drained daily at the hospital. CM will follow up for referral in the [facility] area. Patient has also been updated on [facility] evaluation for possible symptom management. TOC will continue to follow.         Expected Discharge Plan and Services                                               Social Determinants of Health (SDOH) Interventions SDOH Screenings   Food Insecurity: No Food Insecurity (12/30/2023)  Recent Concern: Food Insecurity - Food Insecurity Present (12/11/2023)  Housing: High Risk (12/30/2023)  Transportation Needs: Unmet Transportation Needs (12/30/2023)  Utilities: Not At Risk (12/30/2023)  Depression (PHQ2-9): Medium Risk (02/15/2020)  Social Connections: Moderately Integrated (12/30/2023)  Tobacco Use: Low Risk  (12/30/2023)    Readmission Risk Interventions    01/01/2024    9:51 AM 10/12/2021    3:40 PM  Readmission Risk Prevention Plan  Transportation  Screening Complete Complete  PCP or Specialist Appt within 5-7 Days Complete   PCP or Specialist Appt within 3-5 Days  Complete  Home Care Screening Complete   Medication Review (RN CM) Complete   HRI or Home Care Consult  Complete  Social Work Consult for Recovery Care Planning/Counseling  Complete  Medication Review Oceanographer)  Complete

## 2024-02-13 DIAGNOSIS — G893 Neoplasm related pain (acute) (chronic): Secondary | ICD-10-CM | POA: Diagnosis not present

## 2024-02-13 NOTE — Plan of Care (Signed)
  Problem: Clinical Measurements: Goal: Will remain free from infection Outcome: Progressing   Problem: Activity: Goal: Risk for activity intolerance will decrease Outcome: Progressing   Problem: Nutrition: Goal: Adequate nutrition will be maintained Outcome: Progressing   Problem: Coping: Goal: Level of anxiety will decrease Outcome: Progressing   Problem: Elimination: Goal: Will not experience complications related to bowel motility Outcome: Progressing Goal: Will not experience complications related to urinary retention Outcome: Progressing   Problem: Pain Managment: Goal: General experience of comfort will improve and/or be controlled Outcome: Progressing   Problem: Safety: Goal: Ability to remain free from injury will improve Outcome: Progressing

## 2024-02-13 NOTE — Plan of Care (Signed)

## 2024-02-13 NOTE — TOC Progression Note (Addendum)
 Transition of Care [facility]) - Progression Note    Patient Details  Name: [name] MRN: [id number] Date of Birth: 02-Oct-1971  Transition of Care [facility]) CM/SW Contact  Beckie Busing, RN Phone Number:[phone]  02/13/2024, 2:09 PM  Clinical Narrative:    CM spoke with [facility] liaison for [facility] to follow up on referral. Per Efraim Kaufmann ashe spoke with patient who states that her plan is now to go to [facility] to live with her brother. Patient currently does not meet inpateint criteria. TOC following with Hospice referrals in [place] area.   1415 Message has been sent to [facility] with [facility] to inquire about home with hospice coverage in the [place] area. Address for referral is [address] [place] [address].  [phone] Address is outside of service area for [facility].   1500 Referral has been sent to [facility] with [facility]         Expected Discharge Plan and Services                                               Social Determinants of Health (SDOH) Interventions SDOH Screenings   Food Insecurity: No Food Insecurity (12/30/2023)  Recent Concern: Food Insecurity - Food Insecurity Present (12/11/2023)  Housing: High Risk (12/30/2023)  Transportation Needs: Unmet Transportation Needs (12/30/2023)  Utilities: Not At Risk (12/30/2023)  Depression (PHQ2-9): Medium Risk (02/15/2020)  Social Connections: Moderately Integrated (12/30/2023)  Tobacco Use: Low Risk  (12/30/2023)    Readmission Risk Interventions    01/01/2024    9:51 AM 10/12/2021    3:40 PM  Readmission Risk Prevention Plan  Transportation Screening Complete Complete  PCP or Specialist Appt within 5-7 Days Complete   PCP or Specialist Appt within 3-5 Days  Complete  Home Care Screening Complete   Medication Review (RN CM) Complete   HRI or Home Care Consult  Complete  Social Work Consult for Recovery Care Planning/Counseling  Complete  Medication Review Oceanographer)  Complete

## 2024-02-13 NOTE — Progress Notes (Signed)
 PROGRESS NOTE    Misty Arnold  ZOX:[id number] DOB: 12/31/1970 DOA: 12/30/2023 PCP: Serena Croissant, MD   Brief Narrative:  This 53 year old female with PMH significant for right sided metastatic breast cancer status post palliative chemo, radiation and mastectomy, chronic pain secondary to cancer, depression and anxiety who was admitted for uncontrolled pain. Now is a placement /safe d/c issue. Palliative care helping with disposition.   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 Plan is to transition her to home under hospice care with the PCA. TOC unable to find an agency to accept her in [place]. Palliative care following.   Metastasis from breast cancer [facility]) She has fungating mass across her right chest wall encroaching into her left chest wall Continue twice daily dressing changes. Has extensive bony metastasis, mets to the liver and lungs. Continue Decadron.   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 Status post 1.4 L removal via thoracentesis on 2/26. Status post right Pleurx catheter placement by IR.  Continue as needed drainage    underweight/moderate malnutrition: -Follow nutrition recommendations   Anemia of chronic disease: -From cancer.   Thrombocytosis: -Possibly reactive     DVT prophylaxis:Lovenox Code Status: DNR Family Communication: No family at bed side Disposition Plan:  Status is: Inpatient Remains inpatient appropriate because: Pending safe disposition.    Consultants:  Palliative care  Procedures: None  Antimicrobials: Anti-infectives (From admission, onward)    Start     Dose/Rate Route Frequency Ordered Stop   02/06/24 1000   fluconazole (DIFLUCAN) tablet 100 mg        100 mg Oral Daily 02/05/24 2230 02/08/24 0924   01/20/24 1500  vancomycin (VANCOCIN) IVPB 1000 mg/200 mL premix        1,000 mg 200 mL/hr over 60 Minutes Intravenous To Radiology 01/19/24 0824 01/22/24 1618      Subjective: Patient was seen and examined at bedside.  Overnight events noted.   Patient reports pain is reasonably controlled. TOC is working on her placement.  Objective: Vitals:   02/13/24 0524 02/13/24 0718 02/13/24 1059 02/13/24 1102  BP: 97/60  (!) 91/48   Pulse: 93  92   Resp: 18 18  19   Temp: 97.6 F (36.4 C)     TempSrc: Oral     SpO2: 100%     Weight:      Height:        Intake/Output Summary (Last 24 hours) at 02/13/2024 1217 Last data filed at 02/12/2024 1500 Gross per 24 hour  Intake --  Output 400 ml  Net -400 ml   Filed Weights   12/30/23 1220  Weight: 54.4 kg    Examination:  General exam: Appears calm and comfortable, not in any acute distress. Respiratory system: CTA Bilaterally. Respiratory effort normal.  RR 16 Cardiovascular system: S1 & S2 heard, RRR. No JVD, murmurs, rubs, gallops or clicks. Gastrointestinal system: Abdomen is non distended, soft and non tender. Normal bowel sounds heard. Central nervous system: Alert and oriented x 3. No focal neurological deficits. Extremities: No edema, no cyanosis, no clubbing. Skin: No rashes, lesions or ulcers Psychiatry: Judgement and insight appear normal. Mood & affect appropriate.     Data Reviewed: I have  personally reviewed following labs and imaging studies  CBC: No results for input(s): "WBC", "NEUTROABS", "HGB", "HCT", "MCV", "PLT" in the last 168 hours. Basic Metabolic Panel: No results for input(s): "NA", "K", "CL", "CO2", "GLUCOSE", "BUN", "CREATININE", "CALCIUM", "MG", "PHOS" in the last 168 hours. GFR: Estimated Creatinine Clearance: 62.1 mL/min (by C-G formula based on SCr of 0.89 mg/dL). Liver Function Tests: No results for  input(s): "AST", "ALT", "ALKPHOS", "BILITOT", "PROT", "ALBUMIN" in the last 168 hours. No results for input(s): "LIPASE", "AMYLASE" in the last 168 hours. No results for input(s): "AMMONIA" in the last 168 hours. Coagulation Profile: No results for input(s): "INR", "PROTIME" in the last 168 hours. Cardiac Enzymes: No results for input(s): "CKTOTAL", "CKMB", "CKMBINDEX", "TROPONINI" in the last 168 hours. BNP (last 3 results) No results for input(s): "PROBNP" in the last 8760 hours. HbA1C: No results for input(s): "HGBA1C" in the last 72 hours. CBG: No results for input(s): "GLUCAP" in the last 168 hours. Lipid Profile: No results for input(s): "CHOL", "HDL", "LDLCALC", "TRIG", "CHOLHDL", "LDLDIRECT" in the last 72 hours. Thyroid Function Tests: No results for input(s): "TSH", "T4TOTAL", "FREET4", "T3FREE", "THYROIDAB" in the last 72 hours. Anemia Panel: No results for input(s): "VITAMINB12", "FOLATE", "FERRITIN", "TIBC", "IRON", "RETICCTPCT" in the last 72 hours. Sepsis Labs: No results for input(s): "PROCALCITON", "LATICACIDVEN" in the last 168 hours.  No results found for this or any previous visit (from the past 240 hours).  Radiology Studies: No results found.  Scheduled Meds:  ammonium lactate   Topical BID   atenolol  50 mg Oral BID   celecoxib  200 mg Oral QHS   Chlorhexidine Gluconate Cloth  6 each Topical Daily   dexamethasone  4 mg Oral Daily   enoxaparin (LOVENOX) injection  40 mg Subcutaneous Q24H   feeding supplement  1 Container Oral TID BM   HYDROmorphone   Intravenous Q4H   lidocaine-EPINEPHrine  20 mL Intradermal Once   magic mouthwash  5 mL Oral TID   metroNIDAZOLE   Topical BID   mirtazapine  15 mg Oral QHS   multivitamins with iron  1 tablet Oral Daily   oxyCODONE  40 mg Oral Q8H   polyethylene glycol  17 g Oral BID   senna-docusate  1 tablet Oral BID   sodium chloride flush  10-40 mL Intracatheter Q12H   Vitamin D (Ergocalciferol)  50,000 Units Oral Q7  days   Continuous Infusions:   LOS: 45 days    Time spent: 35 mins    Willeen Niece, MD [facility]   If 7PM-7AM, please contact night-coverage

## 2024-02-14 ENCOUNTER — Encounter: Payer: Self-pay | Admitting: Hematology and Oncology

## 2024-02-14 DIAGNOSIS — G893 Neoplasm related pain (acute) (chronic): Secondary | ICD-10-CM | POA: Diagnosis not present

## 2024-02-14 NOTE — Consult Note (Signed)
 WOC Nurse Consult Note: Reason for Consult: assist with solutions for highly exudative fungating breast tumor  Wound type: see previous note  Dressing procedure/placement/frequency: Suggestions discussed with patient and team 1. Remove old dressings.  2. (OMIT Metrogel)- this is adding moisture to exudative wound. Discussed this is typically used for radiation burns and wounds that are only slightly weeping 3. apply calicium alginate to the area that is bleeding (she reported that has improved) 4. Cover the remainder of the wound with silver hydrofiber, then ADD either the Xtrasorb or the Sorbion (NOT BOTH) because we wont know which is helping.  5. OMIT the telfa -I explained this is serving no real purpose (she thinks non adherent which it is but she is not putting it on her wound, and it is not absorbant. So top xtrasorb or sorbion (and I would use 2 xtrasorb maybe only one sobion if covers area, then top with ABD pads.   Patient is changing her own dressings 2x day Using 1 alginate, 2-3 silver hydrofiber, NEW (1 or 2 Xtrasorb or Sorbion), 5 ABD pads Breast binder to hold in place.   Discussed POC with patient and bedside nurse.  Re consult if needed, will not follow at this time. Thanks  Troyce Febo [employer], RN,CWOCN, CNS, CWON-AP [phone])

## 2024-02-14 NOTE — Plan of Care (Signed)

## 2024-02-14 NOTE — TOC Progression Note (Addendum)
 Transition of Care [facility]) - Progression Note    Patient Details  Name: Misty Arnold MRN: [id number] Date of Birth: April 15, 1971  [facility]) CM/SW Contact  Beckie Busing, RN Phone Number:[phone]  02/14/2024, 8:32 AM  Clinical Narrative:    CM received message from [facility] for [facility] to confirm that [facility] can accept patient for home with hospice in the [place] area. Liasion will reach out to patient to determine DME needs and will follow up for nursing availability to assist in determining when patient will be able to discharge.   76 [facility] is also willing to accept patient for  home with hospice in the [place] area. CM at bedside to update patient for choice. Patient states she is still half asleep and will call CM.  1250 Cm at bedside . Patient has decided to go with [facility] for home with hospice. [name] with [name] has been made aware of the referral. Per [facility] has immediate openings for service. She is currently following up for ? DME needs.   1307 CM just spoke with [facility] for [employer]. Patients brother will be able to pick her up Monday. Patient does not have a need for any DME. CM will updated MD. Plan for d/c Monday.           Expected Discharge Plan and Services                                               Social Determinants of Health (SDOH) Interventions SDOH Screenings   Food Insecurity: No Food Insecurity (12/30/2023)  Recent Concern: Food Insecurity - Food Insecurity Present (12/11/2023)  Housing: High Risk (12/30/2023)  Transportation Needs: Unmet Transportation Needs (12/30/2023)  Utilities: Not At Risk (12/30/2023)  Depression (PHQ2-9): Medium Risk (02/15/2020)  Social Connections: Moderately Integrated (12/30/2023)  Tobacco Use: Low Risk  (12/30/2023)    Readmission Risk Interventions    01/01/2024    9:51 AM 10/12/2021    3:40 PM  Readmission Risk Prevention Plan  Transportation  Screening Complete Complete  PCP or Specialist Appt within 5-7 Days Complete   PCP or Specialist Appt within 3-5 Days  Complete  Home Care Screening Complete   Medication Review (RN CM) Complete   HRI or Home Care Consult  Complete  Social Work Consult for Recovery Care Planning/Counseling  Complete  Medication Review [employer])  Complete

## 2024-02-14 NOTE — Plan of Care (Signed)

## 2024-02-14 NOTE — Progress Notes (Signed)
 PROGRESS NOTE    Misty Arnold  NFA:[id number] DOB: 1971-03-19 DOA: 12/30/2023 PCP: Serena Croissant, MD   Brief Narrative:  This 53 year old female with PMH significant for right sided metastatic breast cancer status post palliative chemo, radiation and mastectomy, chronic pain secondary to cancer, depression and anxiety who was admitted for uncontrolled pain. Now is a placement /safe d/c issue. Palliative care helping with disposition.   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 Plan is to transition her to home under hospice care with the PCA. TOC unable to find an agency to accept her in [place]. [facility] can accept patient for home with hospice in the [place] area.  Palliative care following.   Metastasis from breast cancer [facility]) She has fungating mass across her right chest wall encroaching into her left chest wall Continue twice daily dressing changes. Has extensive bony metastasis, mets to the liver and lungs. Continue Decadron.   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 Status post 1.4 L removal via thoracentesis on 2/26. Status post right Pleurx catheter placement by IR.  Continue as needed drainage    underweight/moderate malnutrition: -Follow nutrition recommendations   Anemia of chronic disease: -From cancer. H/H stable.   Thrombocytosis: -Possibly reactive.     DVT prophylaxis:Lovenox Code Status: DNR Family Communication: No family at bed side Disposition Plan:  Status is: Inpatient Remains inpatient appropriate because: Pending safe disposition.  [facility] can accept patient for home with hospice in the [place] area.   Consultants:  Palliative  care  Procedures: None  Antimicrobials: Anti-infectives (From admission, onward)    Start     Dose/Rate Route Frequency Ordered Stop   02/06/24 1000  fluconazole (DIFLUCAN) tablet 100 mg        100 mg Oral Daily 02/05/24 2230 02/08/24 0924   01/20/24 1500  vancomycin (VANCOCIN) IVPB 1000 mg/200 mL premix        1,000 mg 200 mL/hr over 60 Minutes Intravenous To Radiology 01/19/24 0824 01/22/24 1618      Subjective: Patient was seen and examined at bedside.  Overnight events noted.   Patient reports pain is reasonably controlled.  TOC is working on her placement. [facility] can accept patient for home with hospice in the [place] area.   Objective: Vitals:   02/14/24 0517 02/14/24 0705 02/14/24 1011 02/14/24 1151  BP: (!) 102/54  (!) 88/63   Pulse: (!) 104  (!) 109   Resp: 18 16  18   Temp: 98.2 F (36.8 C)     TempSrc: Oral     SpO2: 100%     Weight:      Height:        Intake/Output Summary (Last 24 hours) at 02/14/2024 1243 Last data filed at 02/14/2024 0410 Gross per 24 hour  Intake 608.5 ml  Output 350 ml  Net 258.5 ml   Filed Weights   12/30/23 1220  Weight: 54.4 kg    Examination:  General exam: Appears calm and comfortable, not in any acute distress. Respiratory system: CTA Bilaterally. Respiratory effort normal.  RR 14 Cardiovascular system: S1 & S2 heard, RRR. No JVD, murmurs, rubs, gallops or clicks. Gastrointestinal system: Abdomen is non distended, soft and non tender. Normal bowel  sounds heard. Central nervous system: Alert and oriented x 3. No focal neurological deficits. Extremities: No edema, no cyanosis, no clubbing. Skin: No rashes, lesions or ulcers Psychiatry: Judgement and insight appear normal. Mood & affect appropriate.     Data Reviewed: I have personally reviewed following labs and imaging studies  CBC: No results for input(s): "WBC", "NEUTROABS", "HGB", "HCT", "MCV", "PLT" in the last 168 hours. Basic Metabolic Panel: No results  for input(s): "NA", "K", "CL", "CO2", "GLUCOSE", "BUN", "CREATININE", "CALCIUM", "MG", "PHOS" in the last 168 hours. GFR: Estimated Creatinine Clearance: 62.1 mL/min (by C-G formula based on SCr of 0.89 mg/dL). Liver Function Tests: No results for input(s): "AST", "ALT", "ALKPHOS", "BILITOT", "PROT", "ALBUMIN" in the last 168 hours. No results for input(s): "LIPASE", "AMYLASE" in the last 168 hours. No results for input(s): "AMMONIA" in the last 168 hours. Coagulation Profile: No results for input(s): "INR", "PROTIME" in the last 168 hours. Cardiac Enzymes: No results for input(s): "CKTOTAL", "CKMB", "CKMBINDEX", "TROPONINI" in the last 168 hours. BNP (last 3 results) No results for input(s): "PROBNP" in the last 8760 hours. HbA1C: No results for input(s): "HGBA1C" in the last 72 hours. CBG: No results for input(s): "GLUCAP" in the last 168 hours. Lipid Profile: No results for input(s): "CHOL", "HDL", "LDLCALC", "TRIG", "CHOLHDL", "LDLDIRECT" in the last 72 hours. Thyroid Function Tests: No results for input(s): "TSH", "T4TOTAL", "FREET4", "T3FREE", "THYROIDAB" in the last 72 hours. Anemia Panel: No results for input(s): "VITAMINB12", "FOLATE", "FERRITIN", "TIBC", "IRON", "RETICCTPCT" in the last 72 hours. Sepsis Labs: No results for input(s): "PROCALCITON", "LATICACIDVEN" in the last 168 hours.  No results found for this or any previous visit (from the past 240 hours).  Radiology Studies: No results found.  Scheduled Meds:  ammonium lactate   Topical BID   atenolol  50 mg Oral BID   celecoxib  200 mg Oral QHS   Chlorhexidine Gluconate Cloth  6 each Topical Daily   dexamethasone  4 mg Oral Daily   enoxaparin (LOVENOX) injection  40 mg Subcutaneous Q24H   feeding supplement  1 Container Oral TID BM   HYDROmorphone   Intravenous Q4H   lidocaine-EPINEPHrine  20 mL Intradermal Once   magic mouthwash  5 mL Oral TID   metroNIDAZOLE   Topical BID   mirtazapine  15 mg Oral QHS    multivitamins with iron  1 tablet Oral Daily   oxyCODONE  40 mg Oral Q8H   polyethylene glycol  17 g Oral BID   senna-docusate  1 tablet Oral BID   sodium chloride flush  10-40 mL Intracatheter Q12H   Vitamin D (Ergocalciferol)  50,000 Units Oral Q7 days   Continuous Infusions:   LOS: 46 days    Time spent: 35 mins    Willeen Niece, MD [facility]   If 7PM-7AM, please contact night-coverage

## 2024-02-15 DIAGNOSIS — G893 Neoplasm related pain (acute) (chronic): Secondary | ICD-10-CM | POA: Diagnosis not present

## 2024-02-15 NOTE — Plan of Care (Signed)

## 2024-02-15 NOTE — Progress Notes (Signed)
 PROGRESS NOTE    Misty Arnold  ZOX:[id number] DOB: 08-02-1971 DOA: 12/30/2023 PCP: Cameron Cea, MD   Brief Narrative:  This 53 year old female with PMH significant for right sided metastatic breast cancer status post palliative chemo, radiation and mastectomy, chronic pain secondary to cancer, depression and anxiety who was admitted for uncontrolled pain. Now is a placement /safe d/c issue. Palliative care helping with disposition.   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 Plan is to transition her to home under hospice care with the PCA. TOC unable to find an agency to accept her in [place] . [facility] can accept patient for home with hospice in the [place] area.  Patient will be picked by brother on Monday. Palliative care following.   Metastasis from breast cancer [facility]) She has fungating mass across her right chest wall encroaching into her left chest wall Continue twice daily dressing changes. Has extensive bony metastasis, mets to the liver and lungs. Continue Decadron.   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 Status post 1.4 L removal via thoracentesis on 2/26. Status post right Pleurx catheter placement by IR.  Continue as needed drainage    underweight/moderate malnutrition: -Follow nutrition recommendations   Anemia of chronic disease: -From cancer. H/H stable.   Thrombocytosis: -Possibly reactive.     DVT prophylaxis:Lovenox Code Status: DNR Family Communication: No family at bed side Disposition Plan:  Status is: Inpatient Remains inpatient appropriate because: Pending safe disposition.  [facility] can accept patient for home with hospice in the  [place] area.   Consultants:  Palliative care  Procedures: None  Antimicrobials: Anti-infectives (From admission, onward)    Start     Dose/Rate Route Frequency Ordered Stop   02/06/24 1000  fluconazole (DIFLUCAN) tablet 100 mg        100 mg Oral Daily 02/05/24 2230 02/08/24 0924   01/20/24 1500  vancomycin (VANCOCIN) IVPB 1000 mg/200 mL premix        1,000 mg 200 mL/hr over 60 Minutes Intravenous To Radiology 01/19/24 0824 01/22/24 1618      Subjective: Patient was seen and examined at bedside.  Overnight events noted.   Patient reports pain is reasonably controlled. TOC is working on her placement. [facility] can accept patient for home with hospice in the [place] area.   Objective: Vitals:   02/15/24 0732 02/15/24 0919 02/15/24 1156 02/15/24 1330  BP:  108/62  102/62  Pulse:  96  96  Resp: 16  16 18   Temp:    97.9 F (36.6 C)  TempSrc:    Oral  SpO2: 98%   96%  Weight:      Height:        Intake/Output Summary (Last 24 hours) at 02/15/2024 1428 Last data filed at 02/15/2024 1427 Gross per 24 hour  Intake 312 ml  Output 700 ml  Net -388 ml   Filed Weights   12/30/23 1220  Weight: 54.4 kg    Examination:  General exam: Appears calm and comfortable, not in any acute distress. Respiratory system: CTA Bilaterally. Respiratory effort normal.  RR 15 Cardiovascular system: S1 & S2 heard, RRR. No JVD, murmurs, rubs, gallops or clicks. Gastrointestinal system: Abdomen is non distended, soft and non  tender. Normal bowel sounds heard. Central nervous system: Alert and oriented x 3. No focal neurological deficits. Extremities: No edema, no cyanosis, no clubbing. Skin: No rashes, lesions or ulcers Psychiatry: Judgement and insight appear normal. Mood & affect appropriate.     Data Reviewed: I have personally reviewed following labs and imaging studies  CBC: No results for input(s): "WBC", "NEUTROABS", "HGB", "HCT", "MCV", "PLT" in the last 168 hours. Basic  Metabolic Panel: No results for input(s): "NA", "K", "CL", "CO2", "GLUCOSE", "BUN", "CREATININE", "CALCIUM", "MG", "PHOS" in the last 168 hours. GFR: Estimated Creatinine Clearance: 62.1 mL/min (by C-G formula based on SCr of 0.89 mg/dL). Liver Function Tests: No results for input(s): "AST", "ALT", "ALKPHOS", "BILITOT", "PROT", "ALBUMIN" in the last 168 hours. No results for input(s): "LIPASE", "AMYLASE" in the last 168 hours. No results for input(s): "AMMONIA" in the last 168 hours. Coagulation Profile: No results for input(s): "INR", "PROTIME" in the last 168 hours. Cardiac Enzymes: No results for input(s): "CKTOTAL", "CKMB", "CKMBINDEX", "TROPONINI" in the last 168 hours. BNP (last 3 results) No results for input(s): "PROBNP" in the last 8760 hours. HbA1C: No results for input(s): "HGBA1C" in the last 72 hours. CBG: No results for input(s): "GLUCAP" in the last 168 hours. Lipid Profile: No results for input(s): "CHOL", "HDL", "LDLCALC", "TRIG", "CHOLHDL", "LDLDIRECT" in the last 72 hours. Thyroid Function Tests: No results for input(s): "TSH", "T4TOTAL", "FREET4", "T3FREE", "THYROIDAB" in the last 72 hours. Anemia Panel: No results for input(s): "VITAMINB12", "FOLATE", "FERRITIN", "TIBC", "IRON", "RETICCTPCT" in the last 72 hours. Sepsis Labs: No results for input(s): "PROCALCITON", "LATICACIDVEN" in the last 168 hours.  No results found for this or any previous visit (from the past 240 hours).  Radiology Studies: No results found.  Scheduled Meds:  ammonium lactate   Topical BID   atenolol  50 mg Oral BID   celecoxib  200 mg Oral QHS   Chlorhexidine Gluconate Cloth  6 each Topical Daily   dexamethasone  4 mg Oral Daily   enoxaparin (LOVENOX) injection  40 mg Subcutaneous Q24H   feeding supplement  1 Container Oral TID BM   HYDROmorphone   Intravenous Q4H   lidocaine-EPINEPHrine  20 mL Intradermal Once   magic mouthwash  5 mL Oral TID   metroNIDAZOLE   Topical BID    mirtazapine  15 mg Oral QHS   multivitamins with iron  1 tablet Oral Daily   oxyCODONE  40 mg Oral Q8H   polyethylene glycol  17 g Oral BID   senna-docusate  1 tablet Oral BID   sodium chloride flush  10-40 mL Intracatheter Q12H   Vitamin D (Ergocalciferol)  50,000 Units Oral Q7 days   Continuous Infusions:   LOS: 47 days    Time spent: 35 mins    Magdalene School, MD [facility]   If 7PM-7AM, please contact night-coverage

## 2024-02-16 DIAGNOSIS — G893 Neoplasm related pain (acute) (chronic): Secondary | ICD-10-CM | POA: Diagnosis not present

## 2024-02-16 NOTE — Plan of Care (Signed)
  Problem: Clinical Measurements: Goal: Ability to maintain clinical measurements within normal limits will improve Outcome: Progressing Goal: Diagnostic test results will improve Outcome: Progressing Goal: Respiratory complications will improve Outcome: Progressing Goal: Cardiovascular complication will be avoided Outcome: Progressing   Problem: Pain Managment: Goal: General experience of comfort will improve and/or be controlled Outcome: Progressing   Problem: Safety: Goal: Ability to remain free from injury will improve Outcome: Progressing

## 2024-02-16 NOTE — Progress Notes (Signed)
 PROGRESS NOTE    Misty Arnold  ZOX:[id number] DOB: 1971/08/14 DOA: 12/30/2023 PCP: Cameron Cea, MD   Brief Narrative:  This 53 year old female with PMH significant for right sided metastatic breast cancer status post palliative chemo, radiation and mastectomy, chronic pain secondary to cancer, depression and anxiety who was admitted for uncontrolled pain. Now is a placement /safe d/c issue. Palliative care helping with disposition.   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 Plan is to transition her to home under hospice care with the PCA. TOC unable to find an agency to accept her in [place] . [facility] can accept patient for home with hospice in the [place] area.  Patient will be picked by brother on Monday. Palliative care following.   Metastasis from breast cancer [facility]) She has fungating mass across her right chest wall encroaching into her left chest wall Continue twice daily dressing changes. Has extensive bony metastasis, mets to the liver and lungs. Continue Decadron.   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 Status post 1.4 L removal via thoracentesis on 2/26. Status post right Pleurx catheter placement by IR.  Continue as needed drainage    underweight/moderate malnutrition: -Follow nutrition recommendations   Anemia of chronic disease: -From cancer. H/H stable.   Thrombocytosis: -Possibly reactive.     DVT prophylaxis:Lovenox Code Status: DNR Family Communication: No family at bed side Disposition Plan:  Status is: Inpatient Remains inpatient appropriate because: Pending safe disposition.  [facility] can accept patient for home with hospice in the  [place] area.   Consultants:  Palliative care  Procedures: None  Antimicrobials: Anti-infectives (From admission, onward)    Start     Dose/Rate Route Frequency Ordered Stop   02/06/24 1000  fluconazole (DIFLUCAN) tablet 100 mg        100 mg Oral Daily 02/05/24 2230 02/08/24 0924   01/20/24 1500  vancomycin (VANCOCIN) IVPB 1000 mg/200 mL premix        1,000 mg 200 mL/hr over 60 Minutes Intravenous To Radiology 01/19/24 0824 01/22/24 1618      Subjective: Patient was seen and examined at bedside.  Overnight events noted.   Patient reports pain is reasonably controlled. TOC is working on her placement. [facility] can accept patient for home with hospice in the [place] area.  Patient will be picked up by brother tomorrow.  She denies any other concerns.  Objective: Vitals:   02/15/24 2300 02/16/24 0357 02/16/24 0527 02/16/24 0733  BP:   112/67   Pulse: 98  97   Resp:  16 16 16   Temp:   97.7 F (36.5 C)   TempSrc:   Oral   SpO2:   95% 94%  Weight:      Height:        Intake/Output Summary (Last 24 hours) at 02/16/2024 1204 Last data filed at 02/16/2024 0600 Gross per 24 hour  Intake 753 ml  Output 350 ml  Net 403 ml   Filed Weights   12/30/23 1220  Weight: 54.4 kg    Examination:  General exam: Appears calm and comfortable, not in any acute distress. Respiratory system: CTA Bilaterally. Respiratory effort normal.  RR 12 Cardiovascular system: S1 & S2 heard, RRR. No JVD,  murmurs, rubs, gallops or clicks. Gastrointestinal system: Abdomen is non distended, soft and non tender. Normal bowel sounds heard. Central nervous system: Alert and oriented x 3. No focal neurological deficits. Extremities: No edema, no cyanosis, no clubbing. Skin: No rashes, lesions or ulcers Psychiatry: Judgement and insight appear normal. Mood & affect appropriate.     Data Reviewed: I have personally reviewed following labs and imaging studies  CBC: No results for input(s): "WBC",  "NEUTROABS", "HGB", "HCT", "MCV", "PLT" in the last 168 hours. Basic Metabolic Panel: No results for input(s): "NA", "K", "CL", "CO2", "GLUCOSE", "BUN", "CREATININE", "CALCIUM", "MG", "PHOS" in the last 168 hours. GFR: Estimated Creatinine Clearance: 62.1 mL/min (by C-G formula based on SCr of 0.89 mg/dL). Liver Function Tests: No results for input(s): "AST", "ALT", "ALKPHOS", "BILITOT", "PROT", "ALBUMIN" in the last 168 hours. No results for input(s): "LIPASE", "AMYLASE" in the last 168 hours. No results for input(s): "AMMONIA" in the last 168 hours. Coagulation Profile: No results for input(s): "INR", "PROTIME" in the last 168 hours. Cardiac Enzymes: No results for input(s): "CKTOTAL", "CKMB", "CKMBINDEX", "TROPONINI" in the last 168 hours. BNP (last 3 results) No results for input(s): "PROBNP" in the last 8760 hours. HbA1C: No results for input(s): "HGBA1C" in the last 72 hours. CBG: No results for input(s): "GLUCAP" in the last 168 hours. Lipid Profile: No results for input(s): "CHOL", "HDL", "LDLCALC", "TRIG", "CHOLHDL", "LDLDIRECT" in the last 72 hours. Thyroid Function Tests: No results for input(s): "TSH", "T4TOTAL", "FREET4", "T3FREE", "THYROIDAB" in the last 72 hours. Anemia Panel: No results for input(s): "VITAMINB12", "FOLATE", "FERRITIN", "TIBC", "IRON", "RETICCTPCT" in the last 72 hours. Sepsis Labs: No results for input(s): "PROCALCITON", "LATICACIDVEN" in the last 168 hours.  No results found for this or any previous visit (from the past 240 hours).  Radiology Studies: No results found.  Scheduled Meds:  ammonium lactate   Topical BID   atenolol  50 mg Oral BID   celecoxib  200 mg Oral QHS   Chlorhexidine Gluconate Cloth  6 each Topical Daily   dexamethasone  4 mg Oral Daily   enoxaparin (LOVENOX) injection  40 mg Subcutaneous Q24H   feeding supplement  1 Container Oral TID BM   HYDROmorphone   Intravenous Q4H   lidocaine-EPINEPHrine  20 mL Intradermal Once    magic mouthwash  5 mL Oral TID   metroNIDAZOLE   Topical BID   mirtazapine  15 mg Oral QHS   multivitamins with iron  1 tablet Oral Daily   oxyCODONE  40 mg Oral Q8H   polyethylene glycol  17 g Oral BID   senna-docusate  1 tablet Oral BID   sodium chloride flush  10-40 mL Intracatheter Q12H   Vitamin D (Ergocalciferol)  50,000 Units Oral Q7 days   Continuous Infusions:   LOS: 48 days    Time spent: 25 mins    Magdalene School, MD [facility]   If 7PM-7AM, please contact night-coverage

## 2024-02-17 ENCOUNTER — Other Ambulatory Visit (HOSPITAL_COMMUNITY): Payer: Self-pay

## 2024-02-17 ENCOUNTER — Other Ambulatory Visit: Payer: Self-pay

## 2024-02-17 ENCOUNTER — Encounter: Payer: Self-pay | Admitting: Hematology and Oncology

## 2024-02-17 DIAGNOSIS — Z5982 Transportation insecurity: Secondary | ICD-10-CM | POA: Diagnosis not present

## 2024-02-17 DIAGNOSIS — Z59811 Housing instability, housed, with risk of homelessness: Secondary | ICD-10-CM | POA: Diagnosis not present

## 2024-02-17 DIAGNOSIS — R52 Pain, unspecified: Secondary | ICD-10-CM | POA: Diagnosis not present

## 2024-02-17 DIAGNOSIS — Z451 Encounter for adjustment and management of infusion pump: Secondary | ICD-10-CM | POA: Diagnosis not present

## 2024-02-17 DIAGNOSIS — C50919 Malignant neoplasm of unspecified site of unspecified female breast: Secondary | ICD-10-CM | POA: Diagnosis not present

## 2024-02-17 DIAGNOSIS — G893 Neoplasm related pain (acute) (chronic): Secondary | ICD-10-CM | POA: Diagnosis not present

## 2024-02-17 MED ORDER — VITAMIN D (ERGOCALCIFEROL) 1.25 MG (50000 UNIT) PO CAPS
50000.0000 [IU] | ORAL_CAPSULE | ORAL | 0 refills | Status: DC
  Filled 2024-02-17: qty 5, 35d supply, fill #0

## 2024-02-17 MED ORDER — LORAZEPAM 1 MG PO TABS
1.0000 mg | ORAL_TABLET | ORAL | 0 refills | Status: DC | PRN
  Filled 2024-02-17: qty 30, 10d supply, fill #0

## 2024-02-17 MED ORDER — LORAZEPAM 1 MG PO TABS
1.0000 mg | ORAL_TABLET | ORAL | 0 refills | Status: DC | PRN
  Filled 2024-02-17: qty 10, 2d supply, fill #0

## 2024-02-17 MED ORDER — OXYCODONE HCL ER 40 MG PO T12A
40.0000 mg | EXTENDED_RELEASE_TABLET | Freq: Three times a day (TID) | ORAL | 0 refills | Status: DC
Start: 2024-02-17 — End: 2024-02-17

## 2024-02-17 MED ORDER — LACTULOSE 10 GM/15ML PO SOLN
30.0000 g | Freq: Two times a day (BID) | ORAL | 0 refills | Status: DC | PRN
  Filled 2024-02-17: qty 236, 3d supply, fill #0

## 2024-02-17 MED ORDER — HYDROMORPHONE 1 MG/ML IV SOLN: 1.0000 mg | INTRAVENOUS | 0 refills | Status: DC

## 2024-02-17 MED ORDER — PROCHLORPERAZINE MALEATE 10 MG PO TABS
10.0000 mg | ORAL_TABLET | Freq: Four times a day (QID) | ORAL | 0 refills | Status: DC | PRN
Start: 2024-02-17 — End: 2024-05-22
  Filled 2024-02-17: qty 30, 8d supply, fill #0

## 2024-02-17 MED ORDER — LORAZEPAM 1 MG PO TABS: 1.0000 mg | ORAL_TABLET | ORAL | 0 refills | Status: DC | PRN

## 2024-02-17 MED ORDER — AMMONIUM LACTATE 12 % EX LOTN
TOPICAL_LOTION | Freq: Two times a day (BID) | CUTANEOUS | 0 refills | Status: DC
  Filled 2024-02-17: qty 226, 30d supply, fill #0

## 2024-02-17 MED ORDER — GELATIN ABSORBABLE 12-7 MM EX MISC: 1.0000 | Freq: Every day | CUTANEOUS | Status: DC | PRN

## 2024-02-17 MED ORDER — DIPHENHYDRAMINE HCL 12.5 MG/5ML PO LIQD
12.5000 mg | Freq: Four times a day (QID) | ORAL | 0 refills | Status: DC | PRN
  Filled 2024-02-17: qty 120, 6d supply, fill #0

## 2024-02-17 MED ORDER — DEXAMETHASONE 4 MG PO TABS
4.0000 mg | ORAL_TABLET | Freq: Every day | ORAL | 0 refills | Status: DC
  Filled 2024-02-17: qty 10, 10d supply, fill #0

## 2024-02-17 MED ORDER — DEXAMETHASONE 4 MG PO TABS: 4.0000 mg | ORAL_TABLET | Freq: Every day | ORAL | 0 refills | Status: AC

## 2024-02-17 MED ORDER — NYSTATIN 100000 UNIT/ML MT SUSP
5.0000 mL | Freq: Three times a day (TID) | OROMUCOSAL | 0 refills | Status: DC
  Filled 2024-02-17: qty 50, 4d supply, fill #0

## 2024-02-17 MED ORDER — AQUACEL EXTRA HYDROFIBER 2X2 EX PADS: 1.0000 | MEDICATED_PAD | Freq: Every day | CUTANEOUS | Status: DC | PRN

## 2024-02-17 MED ORDER — XTAMPZA ER 13.5 MG PO C12A
13.5000 mg | EXTENDED_RELEASE_CAPSULE | Freq: Three times a day (TID) | ORAL | 0 refills | Status: DC
  Filled 2024-02-17: qty 30, 11d supply, fill #0

## 2024-02-17 MED ORDER — HYDROMORPHONE 1 MG/ML IV SOLN: 1.0000 mg | INTRAVENOUS | 0 refills | Status: AC

## 2024-02-17 MED ORDER — GELATIN ABSORBABLE 12-7 MM EX MISC
1.0000 | Freq: Every day | CUTANEOUS | 12 refills | Status: DC | PRN
  Filled 2024-02-17: qty 12, 12d supply, fill #0

## 2024-02-17 MED ORDER — XTAMPZA ER 13.5 MG PO C12A: 13.5000 mg | EXTENDED_RELEASE_CAPSULE | Freq: Three times a day (TID) | ORAL | 0 refills | Status: DC

## 2024-02-17 MED ORDER — HYDROMORPHONE 1 MG/ML IV SOLN: 1.0000 mg | INTRAVENOUS | Status: DC

## 2024-02-17 MED ORDER — METRONIDAZOLE 0.75 % EX GEL
Freq: Two times a day (BID) | CUTANEOUS | 0 refills | Status: DC
  Filled 2024-02-17: qty 45, 30d supply, fill #0

## 2024-02-17 NOTE — TOC Progression Note (Signed)
 Transition of Care [facility]) - Progression Note    Patient Details  Name: Rogene Meth MRN: [id number] Date of Birth: 1971-05-15  Transition [facility] CM/SW Contact  Loreda Rodriguez, RN Phone [phone]  02/17/2024, 11:24 AM  Clinical Narrative:    Cm spoke with [facility] liaison Alisa App who confirms that MD will need to write pain meds and PCA script to cover for a few days and then the hospice MD will take over orders once patient is admitted to Hospice services. Patient is to be discharges with brother to transport to his home in [place]. Patients scripts for pain meds and PCA have been faxed to [facility] with [facility] per request and hard copies have been provided in patients discharge packet.      Barriers to Discharge: Continued Medical Work up  Expected Discharge Plan and Services         Expected Discharge Date: 02/17/24                                     Social Determinants of Health (SDOH) Interventions SDOH Screenings   Food Insecurity: No Food Insecurity (12/30/2023)  Recent Concern: Food Insecurity - Food Insecurity Present (12/11/2023)  Housing: High Risk (12/30/2023)  Transportation Needs: Unmet Transportation Needs (12/30/2023)  Utilities: Not At Risk (12/30/2023)  Depression (PHQ2-9): Medium Risk (02/15/2020)  Social Connections: Moderately Integrated (12/30/2023)  Tobacco Use: Low Risk  (12/30/2023)    Readmission Risk Interventions    01/01/2024    9:51 AM 10/12/2021    3:40 PM  Readmission Risk Prevention Plan  Transportation Screening Complete Complete  PCP or Specialist Appt within 5-7 Days Complete   PCP or Specialist Appt within 3-5 Days  Complete  Home Care Screening Complete   Medication Review (RN CM) Complete   HRI or Home Care Consult  Complete  Social Work Consult for Recovery Care Planning/Counseling  Complete  Medication Review Oceanographer)  Complete

## 2024-02-17 NOTE — Discharge Instructions (Signed)
 Advised to follow-up with primary care physician in 1 week. Advised to follow-up with pain management as scheduled. Advised to continue Dilaudid PCA as per home hospice services. Advised to take lorazepam as needed for anxiety.

## 2024-02-17 NOTE — TOC Transition Note (Signed)
 Transition of Care [facility]) - Discharge Note   Patient Details  Name: Misty Arnold MRN: [id number] Date of Birth: [birth year]  [facility] CM/SW Contact:  Loreda Rodriguez, RN Phone [phone]  02/17/2024, 11:45 AM   Clinical Narrative:    Patient has been provided with hard scripts for pain meds and PCA . Alisa App with [facility] has confirmed that scripts have been received. There are no other TOC needs.      Barriers to Discharge: Continued Medical Work up   Patient Goals and CMS Choice            Discharge Placement                       Discharge Plan and Services Additional resources added to the After Visit Summary for                                       Social Drivers of Health (SDOH) Interventions SDOH Screenings   Food Insecurity: No Food Insecurity (12/30/2023)  Recent Concern: Food Insecurity - Food Insecurity Present (12/11/2023)  Housing: High Risk (12/30/2023)  Transportation Needs: Unmet Transportation Needs (12/30/2023)  Utilities: Not At Risk (12/30/2023)  Depression (PHQ2-9): Medium Risk (02/15/2020)  Social Connections: Moderately Integrated (12/30/2023)  Tobacco Use: Low Risk  (12/30/2023)     Readmission Risk Interventions    01/01/2024    9:51 AM 10/12/2021    3:40 PM  Readmission Risk Prevention Plan  Transportation Screening Complete Complete  PCP or Specialist Appt within 5-7 Days Complete   PCP or Specialist Appt within 3-5 Days  Complete  Home Care Screening Complete   Medication Review (RN CM) Complete   HRI or Home Care Consult  Complete  Social Work Consult for Recovery Care Planning/Counseling  Complete  Medication Review Oceanographer)  Complete

## 2024-02-17 NOTE — ED Provider Notes (Signed)
 [facility] EMERGENCY DEPARTMENT  ED Provider Note History  No chief complaint on file.  History of Present Illness Misty Arnold is a 53 y.o. female who presents to the ED for evaluation of PCA pump assistance.  Patient was released from [facility] Today, Patient Was Scheduled to Have Home Health, and Hookup Her Dilaudid  PCA Pump.  Patient Presents Tonight with Her Brother with the Pump.  Apparently Home Health Was Unable to Contact the Family and This Package Was Left on Their Doorstep.  They Present Tonight Requesting Assistance for Working This up.  Patient Is at or near Her Baseline, Patient Does Have Metastatic Breast Cancer, Is on Home Hospice.   Past Medical History:  Diagnosis Date  . Breast cancer (CMS-HCC)   . Hypertension    Past Surgical History:  Procedure Laterality Date  . RT MASTECTOMY  2012  . HYSTERECTOMY     Family History  Problem Relation Age of Onset  . Diabetes Mother   . Diabetes Father   . Diabetes Sister   . Diabetes Maternal Grandfather   . Colon cancer Maternal Grandfather   . Diabetes Paternal Grandmother   . Colon cancer Paternal Grandfather   . Breast cancer Maternal Aunt   . Colon cancer Maternal Aunt    Social History   Socioeconomic History  . Marital status: Divorced  Tobacco Use  . Smoking status: Never  Vaping Use  . Vaping status: Never Used  Substance and Sexual Activity  . Alcohol  use: No   Social Drivers of Health   Food Insecurity: No Food Insecurity (12/30/2023)   Received from [facility]   Hunger Vital Sign   . Worried About Programme researcher, broadcasting/film/video in the Last Year: Never true   . Ran Out of Food in the Last Year: Never true  Transportation Needs: Unmet Transportation Needs (12/30/2023)   Received from [facility] - Transportation   . Lack of Transportation (Medical): Yes   . Lack of Transportation (Non-Medical): Yes  Social Connections: Moderately Integrated (12/30/2023)   Received from [facility]    Social Connection and Isolation Panel [NHANES]   . Frequency of Communication with Friends and Family: More than three times a week   . Attends Religious Services: More than 4 times per year   . Active Member of Clubs or Organizations: Yes   . Attends Banker Meetings: More than 4 times per year   . Marital Status: Divorced  Housing Stability: Unknown (02/17/2024)   Housing Stability Vital Sign   . Homeless in the Last Year: No  Recent Concern: Housing Stability - High Risk (12/30/2023)   Received from [facility] Stability Vital Sign   . Unable to Pay for Housing in the Last Year: Yes   . Number of Times Moved in the Last Year: 3   . Homeless in the Last Year: No   Review of Systems  Unable to perform ROS: Acuity of condition    Physical Exam  BP 124/72 (BP Location: Right upper arm, Patient Position: Sitting)   Pulse 110   Temp 37 C (98.6 F) (Oral)   Resp 25   SpO2 99%  Physical Exam Vitals and nursing note reviewed.  Constitutional:      General: She is not in acute distress.    Appearance: She is well-developed. She is cachectic. She is ill-appearing. She is not diaphoretic.  Cardiovascular:     Rate and Rhythm: Normal rate and regular rhythm.  Pulses: Normal pulses.     Heart sounds: Normal heart sounds.  Pulmonary:     Effort: Pulmonary effort is normal. No respiratory distress.     Breath sounds: Normal breath sounds. No wheezing.  Musculoskeletal:     Cervical back: Full passive range of motion without pain and neck supple. No pain with movement, spinous process tenderness or muscular tenderness.  Skin:    General: Skin is warm.     Capillary Refill: Capillary refill takes less than 2 seconds.  Neurological:     Mental Status: She is alert. Mental status is at baseline.  Psychiatric:        Behavior: Behavior normal.      Procedures  Procedures TIP (no need to delete)  Delete the Procedures section if patient does not have a  procedure.  Medical Decision Making and ED Course  Given the patient's initial exam and evaluation, the following diagnostic evaluation has been ordered. The patient will be placed in the appropriate treatment space, once one is available to complete the evaluation and treatment. I have discussed the plan of care with the patient/representative and have advised that the plan of care may change based upon the results of diagnostic testing. The patient/representative has been asked to not leave prior to the completion of their evaluation. I have advised that if they leave prior to the completion of their evaluation, they will be leaving against medical advice.  Patient presents coming by her brother, patient recently moved in with her brother and is on home hospice due to metastatic breast cancer.  Patient has a package containing her Dilaudid  PCA without any instructions on how this was to be hooked up.  Apparently home health left this on the doorstep.  Nursing staff was able to activate the PCA pump and ensure it is working correctly.  Patient is in fairly significant pain currently secondary to lack of medications, unfortunately she was also discharged with p.o. pain medications for breakthrough pain which she will not be able to pick up till tomorrow.  Given this we will order patient a single dose of MS Contin  for the long acting value.  Encourage patient to continue working with her regular doctors for additional care and evaluation.  Medical Complexity:  [] New and requires workup. [x] New and does not require workup. [] Pertinent labs & imaging results were reviewed by me and considered in my decision making. [] I obtained history from someone other than the patient. [x] I reviewed previous medical records. [] I independently visualized image(s), tracing(s), and/or specimen(s). [] I discussed the patient with another provider.     Results: LABS: No results found for this or any previous  visit (from the past 24 hours).  IMAGING: Notified that Radiology will read radiologic studies and patient will be called if change in report by radiology department. No orders to display        ED Clinical Impression  1. Pain managed using patient-controlled analgesia (PCA)      ED Disposition  Discharge     Follow-up  Gudena, Vinay K, MD [address] [place] [address] [phone]  Schedule an appointment as soon as possible for a visit  As needed       This note was partially written using Dragon (a voice recognition system) and may contain typographical errors missed during proofreading. TIP (no need to delete)  Click Refresh button to add wrap-up information to note.    I personally evaluated and took responsibility for care of  this patient. The physician supervisor was Dempsey Augusta, MD who was available for consult and attested to or co-signed this chart. The service was performed non-incident to a physician.     Stuart Berg T, [place] 02/17/24 [phone]

## 2024-02-17 NOTE — ED Notes (Signed)
Discussed with the patient and all questioned fully answered. She will call me if any problems arise.

## 2024-02-17 NOTE — Discharge Summary (Signed)
 Physician Discharge Summary  Misty Arnold ZOX:[id number] DOB: 07/08/1971 DOA: 12/30/2023  PCP: Serena Croissant, MD  Admit date: 12/30/2023  Discharge date: 02/17/2024  Admitted From: Home  Disposition: Home with Hospice.  Recommendations for Outpatient Follow-up:  Follow up with PCP in 1-2 weeks. Please obtain BMP/CBC in one week. Advised to follow-up with pain management as scheduled. Advised to continue Dilaudid PCA as per home hospice services. Advised to take lorazepam as needed for anxiety.  Home Health: Home Hospice Equipment/Devices:PICC line  Discharge Condition: Stable CODE STATUS:DNR Diet recommendation: Heart Healthy   Brief Summary / Hospital Course: This 53 year old female with PMH significant for right sided metastatic breast cancer status post palliative chemo, radiation and mastectomy, chronic pain secondary to cancer, depression and anxiety who was admitted for uncontrolled pain. Now is a placement /safe d/c issue. Palliative care helping with disposition.  Patient was continued on Dilaudid PCA and her pain was well-controlled.  Subsequently patient's brother came to pick up the patient.  Patient will be moved to the [facility] area and will remain under hospice services for management of pain.  Patient being discharged on Dilaudid PCA.   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 Plan is to transition her to home under hospice care with the PCA. TOC unable to find an agency to accept her in [place]. [facility] can accept patient for home with hospice in the [place] area.  Patient is being picked up by her brother. Palliative care following.   Metastasis from breast cancer  [facility]) She has fungating mass across her right chest wall encroaching into her left chest wall Continue twice daily dressing changes. Has extensive bony metastasis, mets to the liver and lungs. Continue Decadron.   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 Status post 1.4 L removal via thoracentesis on 2/26. Status post right Pleurx catheter placement by IR.  Continue as needed drainage    underweight/moderate malnutrition: -Follow nutrition recommendations.   Anemia of chronic disease: -From cancer. H/H stable.   Thrombocytosis: -Possibly reactive.    Discharge Instructions  Discharge Instructions     Ambulatory Pleural Drainage Schedule   Complete by: As directed    Drain daily, up to max of 1L until patient is only able to drain out . If <123ml for 3 consecutive drains every other day, then call Interventional Radiology [phone]) for evaluation and possible removal.   Call MD for:  difficulty breathing, headache or visual disturbances   Complete by: As directed    Call MD for:  persistant dizziness or light-headedness   Complete by: As directed    Call MD for:  persistant nausea and vomiting   Complete by: As directed    Diet - low sodium heart healthy   Complete by: As directed    Diet - low sodium heart healthy   Complete by: As directed    Diet Carb Modified   Complete by: As directed    Discharge instructions   Complete by: As directed    Advised to follow-up with primary care physician in 1 week. Advised to follow-up with pain management as scheduled. Advised to continue Dilaudid PCA as per home hospice services. Advised to take lorazepam as needed for anxiety.  Discharge wound care:   Complete by: As directed    Follow-up wound care   Discharge wound care:   Complete by: As directed    With home hospice.   Increase activity slowly   Complete by: As directed    Increase activity slowly   Complete by: As directed       Allergies as of  02/17/2024       Reactions   Amoxicillin Itching, Rash   Penicillins Rash   Did it involve swelling of the face/tongue/throat, SOB, or low BP? no Did it involve sudden or severe rash/hives, skin peeling, or any reaction on the inside of your mouth or nose? Yes Did you need to seek medical attention at a hospital or doctor's office? No When did it last happen? early 20's     If all above answers are "NO", may proceed with cephalosporin use.   Pork-derived Products    Tamoxifen Hypertension   Tramadol Other (See Comments), Hypertension   Increases the blood pressure        Medication List     STOP taking these medications    Aquacel Ag Advantage 4"X5" Pads   diazepam 5 MG tablet Commonly known as: VALIUM   Metronidazole Benzoate Powd   Oxycodone HCl 10 MG Tabs       TAKE these medications    ammonium lactate 12 % lotion Commonly known as: LAC-HYDRIN Apply topically 2 (two) times daily.   Aquacel Extra Hydrofiber 2x2 Pads Apply 1 Application topically daily as needed.   atenolol 50 MG tablet Commonly known as: TENORMIN Take 1 tablet (50 mg total) by mouth 2 (two) times daily.   celecoxib 200 MG capsule Commonly known as: CELEBREX Take 1 capsule (200 mg total) by mouth at bedtime.   Constulose 10 GM/15ML solution Generic drug: lactulose Take 45 mLs (30 g total) by mouth 2 (two) times daily as needed for moderate constipation.   dexamethasone 4 MG tablet Commonly known as: DECADRON Take 1 tablet (4 mg total) by mouth daily for 10 days. Start taking on: February 18, 2024 What changed:  medication strength how much to take   gelatin adsorbable 12-7 MM sponge Commonly known as: GELFOAM/SURGIFOAM Apply 1 each topically daily as needed (Wound bleeding).   Geri-Dryl 12.5 MG/5ML liquid Generic drug: diphenhydrAMINE Take 5 mLs (12.5 mg total) by mouth every 6 (six) hours as needed for itching.   HYDROmorphone 1 mg/mL injection Commonly known as:  DILAUDID Inject 1 mL (1 mg total) into the vein every 4 (four) hours for 3 days.   LORazepam 1 MG tablet Commonly known as: ATIVAN Take 1 tablet (1 mg total) by mouth every 4 (four) hours as needed for up to 3 days for anxiety.   magic mouthwash (nystatin, diphenhydrAMINE, alum & mag hydroxide) suspension mixture Take 5 mLs by mouth 3 (three) times daily.   magnesium oxide 400 (240 Mg) MG tablet Commonly known as: MAG-OX Take 1 tablet (400 mg total) by mouth 2 (two) times daily.   metroNIDAZOLE 0.75 % gel Commonly known as: METROGEL Apply topically 2 (two) times daily. What changed: Another medication with the same name was added. Make sure you understand how and when to take each.   metroNIDAZOLE 0.75 % gel Commonly known as: METROGEL Apply topically 2 (two) times daily. What changed: You were already taking a medication with the same name, and this prescription was added. Make sure you understand how and when to take each.   mirtazapine 15  MG tablet Commonly known as: REMERON Take 1 tablet (15 mg total) by mouth at bedtime.   multivitamin with minerals tablet FLORADIX Supplement-Iron +minerals What changed:  how much to take how to take this when to take this additional instructions   ondansetron 8 MG tablet Commonly known as: ZOFRAN Take 1 tablet (8 mg total) by mouth every 8 (eight) hours as needed for nausea or vomiting.   polyethylene glycol 17 g packet Commonly known as: MIRALAX / GLYCOLAX Take 17 g by mouth daily. What changed:  when to take this reasons to take this   potassium chloride SA 20 MEQ tablet Commonly known as: KLOR-CON M Take 1 tablet by mouth daily.   prochlorperazine 10 MG tablet Commonly known as: COMPAZINE Take 1 tablet (10 mg total) by mouth every 6 (six) hours as needed for nausea or vomiting. What changed: Another medication with the same name was added. Make sure you understand how and when to take each.   prochlorperazine 10 MG  tablet Commonly known as: COMPAZINE Take 1 tablet (10 mg total) by mouth every 6 (six) hours as needed for nausea or vomiting. What changed: You were already taking a medication with the same name, and this prescription was added. Make sure you understand how and when to take each.   Vitamin D (Ergocalciferol) 1.25 MG (50000 UNIT) Caps capsule Commonly known as: DRISDOL Take 1 capsule (50,000 Units total) by mouth every 7 (seven) days. What changed: Another medication with the same name was added. Make sure you understand how and when to take each.   Vitamin D (Ergocalciferol) 1.25 MG (50000 UNIT) Caps capsule Commonly known as: DRISDOL Take 1 capsule (50,000 Units total) by mouth every 7 (seven) days. Start taking on: February 20, 2024 What changed: You were already taking a medication with the same name, and this prescription was added. Make sure you understand how and when to take each.   Xtampza ER 13.5 MG C12a Generic drug: oxyCODONE ER Take 13.5 mg by mouth every 8 (eight) hours for 3 days. What changed: how much to take               Discharge Care Instructions  (From admission, onward)           Start     Ordered   02/17/24 0000  Discharge wound care:       Comments: Follow-up wound care   02/17/24 1047   02/17/24 0000  Discharge wound care:       Comments: With home hospice.   02/17/24 1103            Follow-up Information     [facility] Follow up.   Why: Resumption of care with [facility] for home health PT/RN services. Contact information: [address] [place] [address] [phone]         Cameron Cea, MD Follow up in 1 week(s).   Specialty: Hematology and Oncology Contact information: [address] [place] [address] [phone]                Allergies  Allergen Reactions   Amoxicillin Itching and Rash   Penicillins Rash    Did it involve swelling of the face/tongue/throat, SOB, or low BP? no Did it  involve sudden or severe rash/hives, skin peeling, or any reaction on the inside of your mouth or nose? Yes Did you need to seek medical attention at a hospital or doctor's office? No When did it last happen?  early 20's     If all above answers are "NO", may proceed with cephalosporin use.   Pork-Derived Products    Tamoxifen Hypertension   Tramadol Other (See Comments) and Hypertension    Increases the blood pressure    Consultations: Palliative care Oncology   Procedures/Studies: IR PERC PLEURAL DRAIN W/INDWELL CATH W/IMG GUIDE Result Date: 01/22/2024 CLINICAL DATA:  Metastatic breast cancer.  Palliative. EXAM: INSERTION OF TUNNELED RIGHT SIDED PLEURAL DRAINAGE CATHETER COMPARISON:  CT chest, 12/12/2023. MEDICATIONS: Vancomycin 1 gm IV; Antibiotic was administered in an appropriate time interval for the procedure. 25 mg Benadryl IV. ANESTHESIA/SEDATION: Moderate (conscious) sedation was employed during this procedure. A total of Versed 3 mg and Fentanyl 150 mcg was administered intravenously. Moderate Sedation Time: 22 minutes. The patient's level of consciousness and vital signs were monitored continuously by radiology nursing throughout the procedure under my direct supervision. FLUOROSCOPY: Fluoroscopic dose; 2 mGy COMPLICATIONS: None immediate. PROCEDURE: The procedure, risks, benefits, and alternatives were explained to the patient, who wishes to proceed with the placement of this permanent pleural catheter as they are seeking palliative care. The patient understands and consents to the procedure. The RIGHT lateral chest and upper abdomen were prepped and draped in a sterile fashion, and a sterile drape was applied covering the operative field. A sterile gown and sterile gloves were used for the procedure. Initial ultrasound scanning and fluoroscopic imaging demonstrates a recurrent moderate to large pleural effusion. Under direct ultrasound guidance, the inferior lateral pleural space was  accessed with a Yueh sheath needle after the overlying soft tissues were anesthetized with 1% lidocaine with epinephrine. An Amplatz super stiff wire was then advanced under fluoroscopy into the pleural space. A 15.5 Fr tunneled Pleur-X catheter was tunneled from an incision within the right upper abdominal quadrant to the access site. The pleural access site was serially dilated under fluoroscopy, ultimately allowing placement of a peel-away sheath. The catheter was advanced through the peel-away sheath. The sheath was then removed. Final catheter positioning was confirmed with a fluoroscopic radiographic image. The access incision was closed with an interrupted subcutaneous 2-0 Vicryl, then Dermabond was applied on the skin. A 2-0 Ethilon retention suture was applied at the catheter exit site. Large volume thoracentesis was performed through the new catheter utilizing provided bulb vacuum assisted drainage bag. Dressings were applied. The patient tolerated the above procedure well without immediate postprocedural complication. FINDINGS: *Preprocedural ultrasound scanning demonstrates a recurrent moderate sized RIGHT sided pleural effusion. *After ultrasound and fluoroscopic guided placement, the catheter is directed basilar *Following catheter placement, approximately 1200 mL of serosanguineous pleural fluid was removed. IMPRESSION: Successful placement of permanent, tunneled RIGHT pleural drainage catheter via posterolateral approach. 1.2 L of serosanguineous pleural fluid was removed following catheter placement. RECOMMENDATIONS: Intermittent drainage schedule per primary/palliative teams. Art Largo, MD [facility] Electronically Signed   By: Art Largo M.D.   On: 01/22/2024 17:35   DG Chest Port 1 View Result Date: 01/22/2024 CLINICAL DATA:  [id number] Recurrent right pleural effusion [id number] EXAM: PORTABLE CHEST 1 VIEW COMPARISON:  Chest XR,  01/15/2024. CT chest, 01/01/2024. IR fluoroscopy, 01/22/2024 FINDINGS: Support lines: New RIGHT tunneled pleural catheter with basilar tip. Cardiac silhouette is within normal limits. The LEFT lung is mildly hypoinflated but otherwise clear. Improved aeration of the RIGHT lung post thoracentesis and catheter placement, without significant residual pleural effusion. Chronic ex vacuo change of the RIGHT lower lobe with incomplete inflation. No pneumothorax. RIGHT axillary surgical clips. Chronic RIGHT  chest wall deformity, with multilevel lytic osseous lesions. Postsurgical changes of thoracic spine fixation. No interval osseous abnormality. IMPRESSION: 1. New RIGHT tunneled pleural catheter, with basilar tip. No residual pleural effusion. 2. Similar incomplete re-inflation of the RIGHT lower lobe, consistent with ex vacuo change. No pneumothorax Electronically Signed   By: Roanna Banning M.D.   On: 01/22/2024 17:26    Subjective: Patient was seen and examined at bedside.  Overnight events noted.   Patient reports feeling better and wants to be discharged.  Patient is  being discharged to hospice services.  Discharge Exam: Vitals:   02/17/24 0504 02/17/24 0744  BP: (!) 90/56   Pulse:    Resp: 18 17  Temp: 98 F (36.7 C)   SpO2: 94%    Vitals:   02/16/24 2355 02/17/24 0408 02/17/24 0504 02/17/24 0744  BP:   (!) 90/56   Pulse:      Resp: 18 18 18 17   Temp:   98 F (36.7 C)   TempSrc:   Oral   SpO2:   94%   Weight:      Height:        General: Pt is alert, awake, not in acute distress Cardiovascular: RRR, S1/S2 +, no rubs, no gallops Respiratory: CTA bilaterally, no wheezing, no rhonchi Abdominal: Soft, NT, ND, bowel sounds + Extremities: no edema, no cyanosis    The results of significant diagnostics from this hospitalization (including imaging, microbiology, ancillary and laboratory) are listed below for reference.     Microbiology: No results found for this or any previous visit  (from the past 240 hours).   Labs: BNP (last 3 results) No results for input(s): "BNP" in the last 8760 hours. Basic Metabolic Panel: No results for input(s): "NA", "K", "CL", "CO2", "GLUCOSE", "BUN", "CREATININE", "CALCIUM", "MG", "PHOS" in the last 168 hours. Liver Function Tests: No results for input(s): "AST", "ALT", "ALKPHOS", "BILITOT", "PROT", "ALBUMIN" in the last 168 hours. No results for input(s): "LIPASE", "AMYLASE" in the last 168 hours. No results for input(s): "AMMONIA" in the last 168 hours. CBC: No results for input(s): "WBC", "NEUTROABS", "HGB", "HCT", "MCV", "PLT" in the last 168 hours. Cardiac Enzymes: No results for input(s): "CKTOTAL", "CKMB", "CKMBINDEX", "TROPONINI" in the last 168 hours. BNP: Invalid input(s): "POCBNP" CBG: No results for input(s): "GLUCAP" in the last 168 hours. D-Dimer No results for input(s): "DDIMER" in the last 72 hours. Hgb A1c No results for input(s): "HGBA1C" in the last 72 hours. Lipid Profile No results for input(s): "CHOL", "HDL", "LDLCALC", "TRIG", "CHOLHDL", "LDLDIRECT" in the last 72 hours. Thyroid function studies No results for input(s): "TSH", "T4TOTAL", "T3FREE", "THYROIDAB" in the last 72 hours.  Invalid input(s): "FREET3" Anemia work up No results for input(s): "VITAMINB12", "FOLATE", "FERRITIN", "TIBC", "IRON", "RETICCTPCT" in the last 72 hours. Urinalysis    Component Value Date/Time   COLORURINE YELLOW 12/04/2023 1402   APPEARANCEUR CLEAR 12/04/2023 1402   LABSPEC 1.017 12/04/2023 1402   PHURINE 7.0 12/04/2023 1402   GLUCOSEU NEGATIVE 12/04/2023 1402   HGBUR NEGATIVE 12/04/2023 1402   BILIRUBINUR NEGATIVE 12/04/2023 1402   KETONESUR NEGATIVE 12/04/2023 1402   PROTEINUR 30 (A) 12/04/2023 1402   NITRITE NEGATIVE 12/04/2023 1402   LEUKOCYTESUR NEGATIVE 12/04/2023 1402   Sepsis Labs No results for input(s): "WBC" in the last 168 hours.  Invalid input(s): "PROCALCITONIN", "LACTICIDVEN" Microbiology No  results found for this or any previous visit (from the past 240 hours).   Time coordinating discharge: Over 30 minutes  SIGNED:  Magdalene School, MD  [facility] 02/17/2024, 3:09 PM Pager   If 7PM-7AM, please contact night-coverage 1

## 2024-02-18 ENCOUNTER — Other Ambulatory Visit: Payer: Self-pay | Admitting: Internal Medicine

## 2024-02-18 ENCOUNTER — Other Ambulatory Visit (HOSPITAL_COMMUNITY): Payer: Self-pay

## 2024-02-18 MED ORDER — MORPHINE SULFATE (CONCENTRATE) 20 MG/ML PO SOLN: 20.0000 mg | ORAL | 0 refills | Status: DC | PRN

## 2024-02-18 MED ORDER — OXYCODONE HCL 30 MG PO TABS: 30.0000 mg | ORAL_TABLET | ORAL | 0 refills | Status: DC | PRN

## 2024-02-18 MED ORDER — LORAZEPAM 1 MG PO TABS: 1.0000 mg | ORAL_TABLET | ORAL | 0 refills | Status: DC | PRN

## 2024-02-18 MED ORDER — XTAMPZA ER 13.5 MG PO C12A: 13.5000 mg | EXTENDED_RELEASE_CAPSULE | Freq: Three times a day (TID) | ORAL | 0 refills | Status: DC

## 2024-02-18 NOTE — Progress Notes (Signed)
 Oxycontin and xtampza not covered. Sent in script for oxycodone 30mg  q4 prn.

## 2024-02-18 NOTE — Progress Notes (Signed)
 Patient requested prescriptions be sent to [facility]-was unable to fill after discharge.

## 2024-03-02 ENCOUNTER — Inpatient Hospital Stay: Payer: Medicare PPO

## 2024-03-02 ENCOUNTER — Other Ambulatory Visit: Payer: Medicare PPO

## 2024-03-02 ENCOUNTER — Ambulatory Visit: Payer: Medicare PPO | Admitting: Hematology and Oncology

## 2024-03-05 DEATH — deceased

## 2024-03-20 ENCOUNTER — Encounter: Payer: Self-pay | Admitting: Hematology and Oncology
